# Patient Record
Sex: Male | Born: 1935
Health system: Southern US, Community
[De-identification: ages and names within clinical notes are randomized; demographics above are authoritative.]

## PROBLEM LIST (undated history)

## (undated) ENCOUNTER — Emergency Department (HOSPITAL_COMMUNITY): Admission: EM | Payer: Medicare Other | Source: Home / Self Care

## (undated) DIAGNOSIS — I509 Heart failure, unspecified: Secondary | ICD-10-CM

## (undated) DIAGNOSIS — R51 Headache: Secondary | ICD-10-CM

## (undated) DIAGNOSIS — M549 Dorsalgia, unspecified: Secondary | ICD-10-CM

## (undated) DIAGNOSIS — N529 Male erectile dysfunction, unspecified: Secondary | ICD-10-CM

## (undated) DIAGNOSIS — T8859XA Other complications of anesthesia, initial encounter: Secondary | ICD-10-CM

## (undated) DIAGNOSIS — T887XXA Unspecified adverse effect of drug or medicament, initial encounter: Secondary | ICD-10-CM

## (undated) DIAGNOSIS — I10 Essential (primary) hypertension: Secondary | ICD-10-CM

## (undated) DIAGNOSIS — M542 Cervicalgia: Secondary | ICD-10-CM

## (undated) DIAGNOSIS — R3915 Urgency of urination: Secondary | ICD-10-CM

## (undated) DIAGNOSIS — G8929 Other chronic pain: Secondary | ICD-10-CM

## (undated) DIAGNOSIS — M5430 Sciatica, unspecified side: Secondary | ICD-10-CM

## (undated) DIAGNOSIS — N401 Enlarged prostate with lower urinary tract symptoms: Secondary | ICD-10-CM

## (undated) DIAGNOSIS — L570 Actinic keratosis: Secondary | ICD-10-CM

## (undated) DIAGNOSIS — J309 Allergic rhinitis, unspecified: Secondary | ICD-10-CM

## (undated) DIAGNOSIS — E785 Hyperlipidemia, unspecified: Secondary | ICD-10-CM

## (undated) DIAGNOSIS — G5600 Carpal tunnel syndrome, unspecified upper limb: Secondary | ICD-10-CM

## (undated) DIAGNOSIS — I82409 Acute embolism and thrombosis of unspecified deep veins of unspecified lower extremity: Secondary | ICD-10-CM

## (undated) DIAGNOSIS — Z85820 Personal history of malignant melanoma of skin: Secondary | ICD-10-CM

## (undated) DIAGNOSIS — M1990 Unspecified osteoarthritis, unspecified site: Secondary | ICD-10-CM

## (undated) DIAGNOSIS — G5603 Carpal tunnel syndrome, bilateral upper limbs: Secondary | ICD-10-CM

## (undated) DIAGNOSIS — C44599 Other specified malignant neoplasm of skin of other part of trunk: Secondary | ICD-10-CM

## (undated) DIAGNOSIS — M25569 Pain in unspecified knee: Secondary | ICD-10-CM

## (undated) DIAGNOSIS — M7650 Patellar tendinitis, unspecified knee: Secondary | ICD-10-CM

## (undated) DIAGNOSIS — M545 Low back pain: Secondary | ICD-10-CM

## (undated) DIAGNOSIS — M109 Gout, unspecified: Secondary | ICD-10-CM

## (undated) DIAGNOSIS — Z85828 Personal history of other malignant neoplasm of skin: Secondary | ICD-10-CM

## (undated) DIAGNOSIS — I2510 Atherosclerotic heart disease of native coronary artery without angina pectoris: Secondary | ICD-10-CM

## (undated) HISTORY — DX: Allergic rhinitis, unspecified: J30.9

## (undated) HISTORY — PX: LAMINECTOMY: SHX219

## (undated) HISTORY — DX: Carpal tunnel syndrome, unspecified upper limb: G56.00

## (undated) HISTORY — DX: Benign prostatic hyperplasia with lower urinary tract symptoms: N40.1

## (undated) HISTORY — DX: Actinic keratosis: L57.0

## (undated) HISTORY — PX: LATERAL FUSION LUMBAR SPINE, TRANSVERSE: SUR635

## (undated) HISTORY — DX: Acute embolism and thrombosis of unspecified deep veins of unspecified lower extremity: I82.409

## (undated) HISTORY — PX: KNEE ARTHROSCOPY: SUR90

## (undated) HISTORY — DX: Male erectile dysfunction, unspecified: N52.9

## (undated) HISTORY — DX: Gout, unspecified: M10.9

## (undated) HISTORY — DX: Carpal tunnel syndrome, bilateral upper limbs: G56.03

## (undated) HISTORY — DX: Hyperlipidemia, unspecified: E78.5

## (undated) HISTORY — PX: EYE SURGERY: SHX253

## (undated) HISTORY — DX: Personal history of other malignant neoplasm of skin: Z85.828

## (undated) HISTORY — DX: Low back pain: M54.5

## (undated) HISTORY — PX: VITRECTOMY: SHX106

## (undated) HISTORY — PX: JOINT REPLACEMENT: SHX530

## (undated) HISTORY — PX: BACK SURGERY: SHX140

## (undated) HISTORY — DX: Atherosclerotic heart disease of native coronary artery without angina pectoris: I25.10

## (undated) HISTORY — DX: Patellar tendinitis, unspecified knee: M76.50

## (undated) HISTORY — PX: CHOLECYSTECTOMY: SHX55

## (undated) HISTORY — PX: MELANOMA EXCISION: SHX5266

## (undated) HISTORY — DX: Essential (primary) hypertension: I10

## (undated) HISTORY — DX: Unspecified adverse effect of drug or medicament, initial encounter: T88.7XXA

## (undated) HISTORY — PX: BLEPHAROPLASTY: SUR158

## (undated) HISTORY — DX: Other specified malignant neoplasm of skin of other part of trunk: C44.599

## (undated) HISTORY — PX: CARPAL TUNNEL RELEASE: SHX101

## (undated) HISTORY — DX: Pain in unspecified knee: M25.569

## (undated) HISTORY — PX: RECTAL SURGERY: SHX760

## (undated) HISTORY — PX: NASAL SINUS SURGERY: SHX719

## (undated) NOTE — *Deleted (*Deleted)
PCP - DR Lattie Corns Cardiologist - NA   D hest x-ray - 8/21 EKG DOS-  Stress Test - NA ECHO - 12/19 Cardiac Cath - NA   HX OF HTN    REPORTED T JANES PA  All instructions explained to the patient, with a verbal understanding of the material. Patient agrees to go over the instructions while at home for a better understanding. Patient also instructed to self quarantine after being tested for COVID-19. The opportunity to ask questions was provided.

## (undated) NOTE — *Deleted (*Deleted)
PCP - *** Cardiologist - ***  PPM/ICD - *** Device Orders - *** Rep Notified - ***  Chest x-ray - *** EKG - *** Stress Test - *** ECHO - *** Cardiac Cath - ***  Sleep Study - *** CPAP - ***  Fasting Blood Sugar - *** Checks Blood Sugar *** times a day  Blood Thinner Instructions: *** Aspirin Instructions: ***  ERAS Protcol - *** PRE-SURGERY Ensure or G2- ***  COVID TEST- ***   Anesthesia review: ***   -------------  SDW INSTRUCTIONS:  Your procedure is scheduled on 06-10-20 Friday.  Report to [facility] Main Entrance "A" at 1145 A.M., and check in at the Admitting office.  Call this number if you have problems the morning of surgery: [phone]   Remember: Do not eat or drink after midnight the night before your surgery  Take these medicines the morning of surgery with A SIP OF WATER:  amLODipine (NORVASC) carvedilol (COREG)  fluticasone (FLONASE)  hydrALAZINE (APRESOLINE) Oxycodone - if needed   As of today, STOP taking any Aspirin (unless otherwise instructed by your surgeon), Aleve, Naproxen, Ibuprofen, Motrin, Advil, Goody's, BC's, all herbal medications, fish oil, and all vitamins.    The Morning of Surgery  Do not wear jewelry  Do not wear lotions, powders, colognes, or deodorant Men may shave face and neck.  Do not bring valuables to the hospital.  [facility] is not responsible for any belongings or valuables.  If you are a smoker, DO NOT Smoke 24 hours prior to surgery  If you wear a CPAP at night please bring your mask the morning of surgery   Remember that you must have someone to transport you home after your surgery, and remain with you for 24 hours if you are discharged the same day.   Please bring cases for contacts, glasses, hearing aids, dentures or bridgework because it cannot be worn into surgery.    Leave your suitcase in the car.  After surgery it may be brought to your room.  For patients admitted to the hospital,  discharge time will be determined by your treatment team.  Patients discharged the day of surgery will not be allowed to drive home.    Special instructions:   [place]- Preparing For Surgery  Oral Hygiene is also important to reduce your risk of infection.  Remember - BRUSH YOUR TEETH THE MORNING OF SURGERY WITH YOUR REGULAR TOOTHPASTE  Please follow these instructions carefully.   1. Shower the NIGHT BEFORE SURGERY and the MORNING OF SURGERY with DIAL Soap.   2. Wash thoroughly, paying special attention to the area where your surgery will be performed.  3. Thoroughly rinse your body with warm water from the neck down.  4. Pat yourself dry with a CLEAN TOWEL.  5. Wear CLEAN PAJAMAS to bed the night before surgery  6. Place CLEAN SHEETS on your bed the night of your first shower and DO NOT SLEEP WITH PETS.  7. Wear comfortable clothes the morning of surgery.    Day of Surgery:  Please shower the morning of surgery with the DIAL soap Do not apply any deodorants/lotions. Please wear clean clothes to the hospital/surgery center.   Remember to brush your teeth WITH YOUR REGULAR TOOTHPASTE.   Please read over the following fact sheets that you were given.  Patient denies shortness of breath, fever, cough and chest pain.

---

## 1967-08-14 HISTORY — PX: APPENDECTOMY: SHX54

## 1999-05-10 ENCOUNTER — Encounter: Payer: Self-pay | Admitting: *Deleted

## 1999-05-12 ENCOUNTER — Encounter: Payer: Self-pay | Admitting: *Deleted

## 1999-05-12 ENCOUNTER — Ambulatory Visit (HOSPITAL_COMMUNITY): Admission: RE | Admit: 1999-05-12 | Discharge: 1999-05-13 | Payer: Self-pay | Admitting: *Deleted

## 2004-08-24 ENCOUNTER — Ambulatory Visit: Payer: Self-pay | Admitting: Internal Medicine

## 2004-11-28 ENCOUNTER — Ambulatory Visit: Payer: Self-pay | Admitting: Internal Medicine

## 2004-12-22 ENCOUNTER — Ambulatory Visit: Payer: Self-pay | Admitting: Internal Medicine

## 2005-02-09 ENCOUNTER — Ambulatory Visit: Payer: Self-pay | Admitting: Internal Medicine

## 2005-03-12 ENCOUNTER — Ambulatory Visit: Payer: Self-pay | Admitting: Internal Medicine

## 2005-07-09 ENCOUNTER — Ambulatory Visit: Payer: Self-pay | Admitting: Internal Medicine

## 2005-10-01 ENCOUNTER — Ambulatory Visit: Payer: Self-pay | Admitting: Internal Medicine

## 2005-10-08 ENCOUNTER — Ambulatory Visit: Payer: Self-pay | Admitting: Internal Medicine

## 2005-12-31 ENCOUNTER — Ambulatory Visit: Payer: Self-pay | Admitting: Internal Medicine

## 2006-03-01 ENCOUNTER — Ambulatory Visit: Payer: Self-pay | Admitting: Internal Medicine

## 2006-03-08 ENCOUNTER — Ambulatory Visit: Payer: Self-pay | Admitting: Internal Medicine

## 2006-05-29 ENCOUNTER — Ambulatory Visit: Payer: Self-pay | Admitting: Internal Medicine

## 2006-07-29 ENCOUNTER — Ambulatory Visit: Payer: Self-pay | Admitting: Internal Medicine

## 2006-08-26 ENCOUNTER — Ambulatory Visit (HOSPITAL_COMMUNITY): Admission: RE | Admit: 2006-08-26 | Discharge: 2006-08-26 | Payer: Self-pay | Admitting: Orthopedic Surgery

## 2006-09-27 ENCOUNTER — Ambulatory Visit: Payer: Self-pay | Admitting: Internal Medicine

## 2006-12-26 ENCOUNTER — Ambulatory Visit: Payer: Self-pay | Admitting: Internal Medicine

## 2007-02-25 ENCOUNTER — Ambulatory Visit: Payer: Self-pay | Admitting: Internal Medicine

## 2007-03-24 ENCOUNTER — Encounter: Payer: Self-pay | Admitting: Internal Medicine

## 2007-03-27 DIAGNOSIS — M545 Low back pain, unspecified: Secondary | ICD-10-CM

## 2007-03-27 DIAGNOSIS — J309 Allergic rhinitis, unspecified: Secondary | ICD-10-CM

## 2007-03-27 DIAGNOSIS — Z85828 Personal history of other malignant neoplasm of skin: Secondary | ICD-10-CM

## 2007-03-27 HISTORY — DX: Low back pain, unspecified: M54.50

## 2007-03-27 HISTORY — DX: Personal history of other malignant neoplasm of skin: Z85.828

## 2007-03-27 HISTORY — DX: Allergic rhinitis, unspecified: J30.9

## 2007-04-18 ENCOUNTER — Encounter: Admission: RE | Admit: 2007-04-18 | Discharge: 2007-04-18 | Payer: Self-pay | Admitting: Orthopedic Surgery

## 2007-04-22 ENCOUNTER — Ambulatory Visit (HOSPITAL_BASED_OUTPATIENT_CLINIC_OR_DEPARTMENT_OTHER): Admission: RE | Admit: 2007-04-22 | Discharge: 2007-04-22 | Payer: Self-pay | Admitting: Orthopedic Surgery

## 2007-04-22 ENCOUNTER — Encounter (INDEPENDENT_AMBULATORY_CARE_PROVIDER_SITE_OTHER): Payer: Self-pay | Admitting: Orthopedic Surgery

## 2007-05-07 ENCOUNTER — Encounter: Payer: Self-pay | Admitting: Internal Medicine

## 2007-05-09 ENCOUNTER — Ambulatory Visit: Payer: Self-pay | Admitting: Internal Medicine

## 2007-05-09 DIAGNOSIS — I10 Essential (primary) hypertension: Secondary | ICD-10-CM | POA: Insufficient documentation

## 2007-05-09 DIAGNOSIS — C44599 Other specified malignant neoplasm of skin of other part of trunk: Secondary | ICD-10-CM

## 2007-05-09 HISTORY — DX: Essential (primary) hypertension: I10

## 2007-05-09 HISTORY — DX: Other specified malignant neoplasm of skin of other part of trunk: C44.599

## 2007-05-21 ENCOUNTER — Encounter: Payer: Self-pay | Admitting: Internal Medicine

## 2007-06-04 ENCOUNTER — Encounter: Payer: Self-pay | Admitting: Internal Medicine

## 2007-06-05 ENCOUNTER — Ambulatory Visit: Payer: Self-pay | Admitting: Internal Medicine

## 2007-06-26 ENCOUNTER — Encounter: Payer: Self-pay | Admitting: Internal Medicine

## 2007-08-18 ENCOUNTER — Ambulatory Visit: Payer: Self-pay | Admitting: Internal Medicine

## 2007-08-18 DIAGNOSIS — T887XXA Unspecified adverse effect of drug or medicament, initial encounter: Secondary | ICD-10-CM

## 2007-08-18 DIAGNOSIS — I2510 Atherosclerotic heart disease of native coronary artery without angina pectoris: Secondary | ICD-10-CM | POA: Insufficient documentation

## 2007-08-18 HISTORY — DX: Atherosclerotic heart disease of native coronary artery without angina pectoris: I25.10

## 2007-08-18 HISTORY — DX: Unspecified adverse effect of drug or medicament, initial encounter: T88.7XXA

## 2007-08-18 LAB — CONVERTED CEMR LAB
ALT: 47 units/L (ref 0–53)
AST: 34 units/L (ref 0–37)
Albumin: 4.2 g/dL (ref 3.5–5.2)
Alkaline Phosphatase: 54 units/L (ref 39–117)
BUN: 22 mg/dL (ref 6–23)
Bilirubin, Direct: 0.3 mg/dL (ref 0.0–0.3)
CO2: 29 meq/L (ref 19–32)
Calcium: 10 mg/dL (ref 8.4–10.5)
Chloride: 106 meq/L (ref 96–112)
Cholesterol: 223 mg/dL (ref 0–200)
Creatinine, Ser: 1.3 mg/dL (ref 0.4–1.5)
Direct LDL: 147.9 mg/dL
GFR calc Af Amer: 70 mL/min
GFR calc non Af Amer: 58 mL/min
Glucose, Bld: 94 mg/dL (ref 70–99)
HDL: 50.4 mg/dL (ref >39.0)
Potassium: 5.2 meq/L — ABNORMAL HIGH (ref 3.5–5.1)
Sodium: 141 meq/L (ref 135–145)
Total Bilirubin: 1 mg/dL (ref 0.3–1.2)
Total CHOL/HDL Ratio: 4.4
Total Protein: 6.9 g/dL (ref 6.0–8.3)
Triglycerides: 95 mg/dL (ref 0–149)
VLDL: 19 mg/dL (ref 0–40)

## 2007-08-29 ENCOUNTER — Telehealth: Payer: Self-pay | Admitting: Internal Medicine

## 2007-09-04 ENCOUNTER — Telehealth: Payer: Self-pay | Admitting: Internal Medicine

## 2007-10-16 ENCOUNTER — Ambulatory Visit: Payer: Self-pay | Admitting: Internal Medicine

## 2007-10-16 DIAGNOSIS — N138 Other obstructive and reflux uropathy: Secondary | ICD-10-CM | POA: Insufficient documentation

## 2007-10-16 DIAGNOSIS — N401 Enlarged prostate with lower urinary tract symptoms: Secondary | ICD-10-CM

## 2007-10-16 HISTORY — DX: Benign prostatic hyperplasia with lower urinary tract symptoms: N13.8

## 2007-10-16 HISTORY — DX: Benign prostatic hyperplasia with lower urinary tract symptoms: N40.1

## 2007-10-16 LAB — CONVERTED CEMR LAB: PSA: 0.89 ng/mL (ref 0.10–4.00)

## 2008-01-01 ENCOUNTER — Telehealth: Payer: Self-pay | Admitting: Internal Medicine

## 2008-01-16 ENCOUNTER — Ambulatory Visit: Payer: Self-pay | Admitting: Internal Medicine

## 2008-01-16 LAB — CONVERTED CEMR LAB
ALT: 38 units/L (ref 0–53)
AST: 24 units/L (ref 0–37)
Albumin: 3.9 g/dL (ref 3.5–5.2)
Alkaline Phosphatase: 63 units/L (ref 39–117)
BUN: 24 mg/dL — ABNORMAL HIGH (ref 6–23)
Bilirubin, Direct: 0.1 mg/dL (ref 0.0–0.3)
CO2: 27 meq/L (ref 19–32)
Calcium: 9.3 mg/dL (ref 8.4–10.5)
Chloride: 100 meq/L (ref 96–112)
Cholesterol: 218 mg/dL (ref 0–200)
Creatinine, Ser: 1 mg/dL (ref 0.4–1.5)
Direct LDL: 140.8 mg/dL
GFR calc Af Amer: 94 mL/min
GFR calc non Af Amer: 78 mL/min
Glucose, Bld: 87 mg/dL (ref 70–99)
HDL: 62 mg/dL (ref >39.0)
Potassium: 4 meq/L (ref 3.5–5.1)
Sodium: 136 meq/L (ref 135–145)
Total Bilirubin: 1.1 mg/dL (ref 0.3–1.2)
Total CHOL/HDL Ratio: 3.5
Total Protein: 7 g/dL (ref 6.0–8.3)
Triglycerides: 84 mg/dL (ref 0–149)
VLDL: 17 mg/dL (ref 0–40)

## 2008-03-23 ENCOUNTER — Ambulatory Visit: Payer: Self-pay | Admitting: Internal Medicine

## 2008-03-23 DIAGNOSIS — L570 Actinic keratosis: Secondary | ICD-10-CM

## 2008-03-23 HISTORY — DX: Actinic keratosis: L57.0

## 2008-04-13 ENCOUNTER — Ambulatory Visit: Payer: Self-pay | Admitting: Internal Medicine

## 2008-04-13 DIAGNOSIS — G5600 Carpal tunnel syndrome, unspecified upper limb: Secondary | ICD-10-CM

## 2008-04-13 HISTORY — DX: Carpal tunnel syndrome, unspecified upper limb: G56.00

## 2008-05-18 ENCOUNTER — Ambulatory Visit: Payer: Self-pay | Admitting: Internal Medicine

## 2008-06-17 ENCOUNTER — Telehealth: Payer: Self-pay | Admitting: Internal Medicine

## 2008-07-13 ENCOUNTER — Ambulatory Visit: Payer: Self-pay | Admitting: Internal Medicine

## 2008-10-11 ENCOUNTER — Ambulatory Visit: Payer: Self-pay | Admitting: Internal Medicine

## 2008-10-11 LAB — CONVERTED CEMR LAB
ALT: 35 units/L (ref 0–53)
AST: 29 units/L (ref 0–37)
Albumin: 4 g/dL (ref 3.5–5.2)
Alkaline Phosphatase: 53 units/L (ref 39–117)
Bilirubin, Direct: 0.3 mg/dL (ref 0.0–0.3)
Cholesterol: 206 mg/dL (ref 0–200)
Direct LDL: 127.6 mg/dL
HDL: 50.4 mg/dL (ref >39.0)
PSA: 0.77 ng/mL (ref 0.10–4.00)
Total Bilirubin: 1.3 mg/dL — ABNORMAL HIGH (ref 0.3–1.2)
Total CHOL/HDL Ratio: 4.1
Total Protein: 6.9 g/dL (ref 6.0–8.3)
Triglycerides: 72 mg/dL (ref 0–149)
VLDL: 14 mg/dL (ref 0–40)

## 2008-10-18 ENCOUNTER — Ambulatory Visit: Payer: Self-pay | Admitting: Internal Medicine

## 2008-10-18 DIAGNOSIS — E785 Hyperlipidemia, unspecified: Secondary | ICD-10-CM

## 2008-10-18 HISTORY — DX: Hyperlipidemia, unspecified: E78.5

## 2008-11-03 ENCOUNTER — Encounter: Payer: Self-pay | Admitting: Internal Medicine

## 2008-11-19 ENCOUNTER — Ambulatory Visit (HOSPITAL_BASED_OUTPATIENT_CLINIC_OR_DEPARTMENT_OTHER): Admission: RE | Admit: 2008-11-19 | Discharge: 2008-11-19 | Payer: Self-pay | Admitting: Orthopedic Surgery

## 2009-01-12 ENCOUNTER — Ambulatory Visit: Payer: Self-pay | Admitting: Internal Medicine

## 2009-01-12 LAB — CONVERTED CEMR LAB
ALT: 34 units/L (ref 0–53)
AST: 30 units/L (ref 0–37)
Albumin: 3.6 g/dL (ref 3.5–5.2)
Alkaline Phosphatase: 50 units/L (ref 39–117)
Bilirubin, Direct: 0 mg/dL (ref 0.0–0.3)
Cholesterol: 227 mg/dL — ABNORMAL HIGH (ref 0–200)
Direct LDL: 150.9 mg/dL
HDL: 56.8 mg/dL (ref >39.00)
Total Bilirubin: 1.1 mg/dL (ref 0.3–1.2)
Total CHOL/HDL Ratio: 4
Total Protein: 6.8 g/dL (ref 6.0–8.3)
Triglycerides: 93 mg/dL (ref 0.0–149.0)
VLDL: 18.6 mg/dL (ref 0.0–40.0)

## 2009-01-19 ENCOUNTER — Ambulatory Visit: Payer: Self-pay | Admitting: Internal Medicine

## 2009-03-17 ENCOUNTER — Telehealth: Payer: Self-pay | Admitting: *Deleted

## 2009-03-18 ENCOUNTER — Ambulatory Visit: Payer: Self-pay | Admitting: Family Medicine

## 2009-03-18 DIAGNOSIS — M79609 Pain in unspecified limb: Secondary | ICD-10-CM | POA: Insufficient documentation

## 2009-03-23 ENCOUNTER — Telehealth: Payer: Self-pay | Admitting: Family Medicine

## 2009-05-16 ENCOUNTER — Ambulatory Visit: Payer: Self-pay | Admitting: Internal Medicine

## 2009-05-16 LAB — CONVERTED CEMR LAB
ALT: 36 units/L (ref 0–53)
AST: 28 units/L (ref 0–37)
Albumin: 3.9 g/dL (ref 3.5–5.2)
Alkaline Phosphatase: 57 units/L (ref 39–117)
Bilirubin, Direct: 0.2 mg/dL (ref 0.0–0.3)
Cholesterol: 168 mg/dL (ref 0–200)
HDL: 46.2 mg/dL (ref >39.00)
LDL Cholesterol: 111 mg/dL — ABNORMAL HIGH (ref 0–99)
Total Bilirubin: 1 mg/dL (ref 0.3–1.2)
Total CHOL/HDL Ratio: 4
Total Protein: 7.2 g/dL (ref 6.0–8.3)
Triglycerides: 56 mg/dL (ref 0.0–149.0)
VLDL: 11.2 mg/dL (ref 0.0–40.0)

## 2009-05-23 ENCOUNTER — Ambulatory Visit: Payer: Self-pay | Admitting: Internal Medicine

## 2009-07-25 ENCOUNTER — Telehealth: Payer: Self-pay | Admitting: Internal Medicine

## 2009-08-22 ENCOUNTER — Ambulatory Visit: Payer: Self-pay | Admitting: Internal Medicine

## 2009-08-22 LAB — CONVERTED CEMR LAB
Cholesterol, target level: 200 mg/dL
HDL goal, serum: 40 mg/dL
LDL Goal: 100 mg/dL

## 2009-09-16 ENCOUNTER — Telehealth: Payer: Self-pay | Admitting: Internal Medicine

## 2009-09-20 ENCOUNTER — Ambulatory Visit: Payer: Self-pay | Admitting: Internal Medicine

## 2009-10-10 ENCOUNTER — Telehealth: Payer: Self-pay | Admitting: Internal Medicine

## 2009-10-10 DIAGNOSIS — M25569 Pain in unspecified knee: Secondary | ICD-10-CM

## 2009-10-10 DIAGNOSIS — M7650 Patellar tendinitis, unspecified knee: Secondary | ICD-10-CM | POA: Insufficient documentation

## 2009-10-10 HISTORY — DX: Pain in unspecified knee: M25.569

## 2009-10-10 HISTORY — DX: Patellar tendinitis, unspecified knee: M76.50

## 2009-10-12 ENCOUNTER — Encounter: Payer: Self-pay | Admitting: Internal Medicine

## 2009-10-18 ENCOUNTER — Telehealth: Payer: Self-pay | Admitting: Internal Medicine

## 2009-11-16 ENCOUNTER — Encounter: Payer: Self-pay | Admitting: Internal Medicine

## 2009-11-21 ENCOUNTER — Ambulatory Visit: Payer: Self-pay | Admitting: Internal Medicine

## 2009-11-23 ENCOUNTER — Inpatient Hospital Stay (HOSPITAL_COMMUNITY): Admission: RE | Admit: 2009-11-23 | Discharge: 2009-11-27 | Payer: Self-pay | Admitting: Specialist

## 2010-02-20 ENCOUNTER — Ambulatory Visit: Payer: Self-pay | Admitting: Internal Medicine

## 2010-02-20 DIAGNOSIS — N529 Male erectile dysfunction, unspecified: Secondary | ICD-10-CM

## 2010-02-20 DIAGNOSIS — M109 Gout, unspecified: Secondary | ICD-10-CM | POA: Insufficient documentation

## 2010-02-20 DIAGNOSIS — Z8739 Personal history of other diseases of the musculoskeletal system and connective tissue: Secondary | ICD-10-CM | POA: Insufficient documentation

## 2010-02-20 HISTORY — DX: Male erectile dysfunction, unspecified: N52.9

## 2010-02-20 HISTORY — DX: Gout, unspecified: M10.9

## 2010-05-22 ENCOUNTER — Ambulatory Visit: Payer: Self-pay | Admitting: Internal Medicine

## 2010-05-22 LAB — CONVERTED CEMR LAB
ALT: 21 units/L (ref 0–53)
AST: 22 units/L (ref 0–37)
Albumin: 4.2 g/dL (ref 3.5–5.2)
Alkaline Phosphatase: 69 units/L (ref 39–117)
BUN: 17 mg/dL (ref 6–23)
Bilirubin, Direct: 0.2 mg/dL (ref 0.0–0.3)
CO2: 27 meq/L (ref 19–32)
Calcium: 9.5 mg/dL (ref 8.4–10.5)
Chloride: 103 meq/L (ref 96–112)
Cholesterol: 194 mg/dL (ref 0–200)
Creatinine, Ser: 1 mg/dL (ref 0.4–1.5)
GFR calc non Af Amer: 79.82 mL/min (ref >60)
Glucose, Bld: 102 mg/dL — ABNORMAL HIGH (ref 70–99)
HDL: 73.4 mg/dL (ref >39.00)
LDL Cholesterol: 107 mg/dL — ABNORMAL HIGH (ref 0–99)
Potassium: 3.9 meq/L (ref 3.5–5.1)
Sodium: 139 meq/L (ref 135–145)
Total Bilirubin: 1 mg/dL (ref 0.3–1.2)
Total CHOL/HDL Ratio: 3
Total Protein: 6.9 g/dL (ref 6.0–8.3)
Triglycerides: 67 mg/dL (ref 0.0–149.0)
Uric Acid, Serum: 8.7 mg/dL — ABNORMAL HIGH (ref 4.0–7.8)
VLDL: 13.4 mg/dL (ref 0.0–40.0)

## 2010-07-10 ENCOUNTER — Telehealth: Payer: Self-pay | Admitting: Internal Medicine

## 2010-07-24 ENCOUNTER — Telehealth: Payer: Self-pay | Admitting: Internal Medicine

## 2010-07-24 ENCOUNTER — Ambulatory Visit: Payer: Self-pay | Admitting: Internal Medicine

## 2010-07-24 DIAGNOSIS — J069 Acute upper respiratory infection, unspecified: Secondary | ICD-10-CM | POA: Insufficient documentation

## 2010-09-01 ENCOUNTER — Telehealth (INDEPENDENT_AMBULATORY_CARE_PROVIDER_SITE_OTHER): Payer: Self-pay | Admitting: *Deleted

## 2010-09-05 ENCOUNTER — Telehealth: Payer: Self-pay | Admitting: Internal Medicine

## 2010-09-12 NOTE — Progress Notes (Signed)
Summary: ? knee replacement?  Phone Note Call from Patient   Caller: Patient Call For: Stacie Glaze MD Reason for Call: Talk to Nurse, Lab or Test Results Summary of Call: Knee exploded ???   Needs replacement.  Yesterday, it was painful and after church , he was walking and had extreme pain.  Do you want to see him or send him to orrthopedic referral. [phone] Requests Dr. Lovell Sheehan choice of orthopedic MD. Needs Pain meds! [facility] [address]) Initial call taken by: Lynann Beaver CMA,  October 10, 2009 10:03 AM  Follow-up for Phone Call        may see dr Charlann Boxer at [facility]- I sent order to terri- may have vicodan  5-500 1 every 4-6 hours prn pain # 30 as needed  Follow-up by: Willy Eddy, LPN,  October 10, 2009 10:18 AM  New Problems: KNEE PAIN (ICD-719.46) PATELLAR TENDINITIS (ICD-726.64)   New Problems: KNEE PAIN (ICD-719.46) PATELLAR TENDINITIS (ICD-726.64) New/Updated Medications: VICODIN 5-500 MG TABS (HYDROCODONE-ACETAMINOPHEN) one by mouth q 4-6 hours as needed pain Prescriptions: VICODIN 5-500 MG TABS (HYDROCODONE-ACETAMINOPHEN) one by mouth q 4-6 hours as needed pain  #30 x 0   Entered by:   Lynann Beaver CMA   Authorized by:   Stacie Glaze MD   Signed by:   Lynann Beaver CMA on 10/10/2009   Method used:   Telephoned to ...       [facility]  [address] [id number]       [address]       [place], [place]  [address]       Ph: [phone] or [phone]       Fax: [phone]   RxID:   [id number]  Pt notified.

## 2010-09-12 NOTE — Letter (Signed)
Summary: [facility]  [facility]   Imported By: Maryln Gottron 11/17/2008 12:45:12  _____________________________________________________________________  External Attachment:    Type:   Image     Comment:   External Document

## 2010-09-12 NOTE — Progress Notes (Signed)
Summary: Call A Nurse  Phone Note Call from Patient   Summary of Call: called pt and he did go to [facility] [address] an d states he was dx'd with pneumonia and was given levaquin for 5 days- pt instructed to call us back if not better in several days Initial call taken by: Willy Eddy, LPN,  July 10, 2010 10:02 AM     Call-A-Nurse Triage Call Report Triage Record Num: [id number] Operator: Caswell Corwin Patient Name: [name] Call Date & Time: 07/08/2010 1:34:11PM Patient Phone: [phone] PCP: Patient Gender: Male PCP Fax : Patient DOB: 12/22/1931 Practice Name: [facility] Reason for Call: Pt calling that he has congestion, stopped up and achy. SX started 07/06/10. Temp is 100.0 Last voided at 1330. Triaged flu and URI and all ? neg. Pt wants a Z-Pac. Pt has just taken his temp again and it says 101.2. Paged Dr. Kerby Nora and recalled at 1358 and inst to inst pt they do not give anbx without seeing the pt so he may go to an U/C or be seen 07/10/10. Protocol(s) Used: Flu-Like Symptoms Protocol(s) Used: Upper Respiratory Infection (URI) Recommended Outcome per Protocol: Provide Home/Self Care Reason for Outcome: Sudden onset of flu-like symptoms All other situations Care Advice:  ~ 11/

## 2010-09-12 NOTE — Assessment & Plan Note (Signed)
Summary: emp--will fast//ccm   Vital Signs:  Patient profile:   77 year old [name] Height:      72 inches Weight:      200 pounds BMI:     27.22 Temp:     98.2 degrees F oral Pulse rate:   72 / minute Resp:     14 per minute BP sitting:   140 / 80  (left arm)  Vitals Entered By: Willy Eddy, LPN (May 22, 2010 11:12 AM) CC: annual visit for disease management Is Patient Diabetic? No   Primary Care Provider:  Stacie Glaze MD  CC:  annual visit for disease management.  History of Present Illness: Here for Medicare AWV:  1.   Risk factors based on Past M, S, F history: reviewed chart CAD risks identifies as well as gout  2.   Physical Activities:  walks daily 3.   Depression/mood:  moderate depression due to losss of child 4.   Hearing:  hears whispered voice at 6 fee 5.   ADL's:  functional on all levels 6.   Fall Risk:  none 7.   Home Safety:  no risks identified 8.   Height, weight, &visual acuity: see change 9.   Counseling:  weight loss 10.   Labs ordered based on risk factors:  see orders 11.           Referral Coordination orthopedic for follow op of TKR 12.           Care Plan see personalized plan for pt scanned in today 13.            Cognitive Assessment alert and oreinted can calculate recongnized time on clock, good judgement and appropriate mood   Preventive Screening-Counseling & Management  Alcohol-Tobacco     Smoking Status: quit     Year Quit: 1983     Tobacco Counseling: not indicated; no tobacco use  Problems Prior to Update: 1)  Gout, Unspecified  (ICD-274.9) 2)  Erectile Dysfunction, Organic  (ICD-607.84) 3)  Knee Pain  (ICD-719.46) 4)  Patellar Tendinitis  (ICD-726.64) 5)  Foot Pain  (ICD-729.5) 6)  Hyperlipidemia, With High Hdl  (ICD-272.4) 7)  Carpal Tunnel Syndrome, Left  (ICD-354.0) 8)  Actinic Keratosis, Head  (ICD-702.0) 9)  Benign Prostatic Hypertrophy, With Urinary Obstruction  (ICD-600.01) 10)  Uns Advrs Eff Uns Rx  Medicinal&biological Sbstnc  (ICD-995.20) 11)  Cad  (ICD-414.00) 12)  Neoplasm, Malignant, Skin, Trunk  (ICD-173.5) 13)  Hypertension  (ICD-401.9) 14)  Low Back Pain  (ICD-724.2) 15)  Skin Cancer, Hx of  (ICD-V10.83) 16)  Allergic Rhinitis  (ICD-477.9)  Current Problems (verified): 1)  Gout, Unspecified  (ICD-274.9) 2)  Erectile Dysfunction, Organic  (ICD-607.84) 3)  Knee Pain  (ICD-719.46) 4)  Patellar Tendinitis  (ICD-726.64) 5)  Foot Pain  (ICD-729.5) 6)  Hyperlipidemia, With High Hdl  (ICD-272.4) 7)  Carpal Tunnel Syndrome, Left  (ICD-354.0) 8)  Actinic Keratosis, Head  (ICD-702.0) 9)  Benign Prostatic Hypertrophy, With Urinary Obstruction  (ICD-600.01) 10)  Uns Advrs Eff Uns Rx Medicinal&biological Sbstnc  (ICD-995.20) 11)  Cad  (ICD-414.00) 12)  Neoplasm, Malignant, Skin, Trunk  (ICD-173.5) 13)  Hypertension  (ICD-401.9) 14)  Low Back Pain  (ICD-724.2) 15)  Skin Cancer, Hx of  (ICD-V10.83) 16)  Allergic Rhinitis  (ICD-477.9)  Medications Prior to Update: 1)  Astelin 137 Mcg/spray Soln (Azelastine Hcl) .... Two Times A Day 2)  Taztia Xt 300 Mg Cp24 (Diltiazem Hcl Er Beads) .... Once Daily  3)  Triamcinolone Acetonide 0.5 % Crea (Triamcinolone Acetonide) .... As Directed 4)  Micardis Hct 80-25 Mg  Tabs (Telmisartan-Hctz) .... Once Daily 5)  Crestor 10 Mg Tabs (Rosuvastatin Calcium) .... One By Mouth  Monday and Friday ( Twice A Week) 6)  Vicodin 5-500 Mg Tabs (Hydrocodone-Acetaminophen) .... One By Mouth Q 4-6 Hours As Needed Pain 7)  Methylprednisolone (Pak) 4 Mg Tabs (Methylprednisolone) .... Take As Directed  Current Medications (verified): 1)  Astelin 137 Mcg/spray Soln (Azelastine Hcl) .... Two Times A Day 2)  Taztia Xt 300 Mg Cp24 (Diltiazem Hcl Er Beads) .... Once Daily 3)  Triamcinolone Acetonide 0.5 % Crea (Triamcinolone Acetonide) .... As Directed 4)  Micardis Hct 80-25 Mg  Tabs (Telmisartan-Hctz) .... Once Daily 5)  Crestor 20 Mg Tabs (Rosuvastatin Calcium) .Marland Kitchen..  1 On Monday Night 6)  Vicodin 5-500 Mg Tabs (Hydrocodone-Acetaminophen) .... One By Mouth Q 4-6 Hours As Needed Pain  Allergies (verified): 1)  * Atuss  Past History:  Family History: Last updated: 17-Sep-2007 father died from sideroblastic anemia mother died from dementia sister with anurysm  Social History: Last updated: September 17, 2007 Married Former Smoker Alcohol use-no Drug use-no Regular exercise-yes  Risk Factors: Exercise: yes (2007/09/17)  Risk Factors: Smoking Status: quit (05/22/2010)  Past medical, surgical, family and social histories (including risk factors) reviewed, and no changes noted (except as noted below).  Past Medical History: Reviewed history from 04/13/2008 and no changes required. Allergic rhinitis Skin cancer, hx of Hypertension Low back pain carple tunnel  Past Surgical History: Reviewed history from 05/09/2007 and no changes required. knee arthroscopy Appendectomy Cholecystectomy Sinus surgery Vasectomy Lumbar laminectomy Lumbar fusion rectal fisure joint replacement hand  Family History: Reviewed history from 17-Sep-2007 and no changes required. father died from sideroblastic anemia mother died from dementia sister with anurysm  Social History: Reviewed history from 09-17-2007 and no changes required. Married Former Smoker Alcohol use-no Drug use-no Regular exercise-yes  Review of Systems  The patient denies anorexia, fever, weight loss, weight gain, vision loss, decreased hearing, hoarseness, chest pain, syncope, dyspnea on exertion, peripheral edema, prolonged cough, headaches, hemoptysis, abdominal pain, melena, hematochezia, severe indigestion/heartburn, hematuria, incontinence, genital sores, muscle weakness, suspicious skin lesions, transient blindness, difficulty walking, depression, unusual weight change, abnormal bleeding, enlarged lymph nodes, angioedema, breast masses, and testicular masses.         Flu Vaccine  Consent Questions     Do you have a history of severe allergic reactions to this vaccine? no    Any prior history of allergic reactions to egg and/or gelatin? no    Do you have a sensitivity to the preservative Thimersol? no    Do you have a past history of Guillan-Barre Syndrome? no    Do you currently have an acute febrile illness? no    Have you ever had a severe reaction to latex? no    Vaccine information given and explained to patient? yes    Are you currently pregnant? no    Lot Number:[id number]   Exp Date:02/10/2011   Site Given  Left Deltoid IM   Physical Exam  General:  Well-developed,well-nourished,in no acute distress; alert,appropriate and cooperative throughout examination Eyes:  pupils equal, pupils round, and pupils reactive to light.   Ears:  R ear normal and L ear normal.   Neck:  No deformities, masses, or tenderness noted. Lungs:  normal respiratory effort and no wheezes.   Heart:  normal rate and regular rhythm.   Abdomen:  soft and non-tender.  Msk:  joint tenderness, joint swelling, and joint warmth.   Extremities:  No clubbing, cyanosis, edema, or deformity noted with normal full range of motion of all joints.   Neurologic:  No cranial nerve deficits noted. Station and gait are normal. Plantar reflexes are down-going bilaterally. DTRs are symmetrical throughout. Sensory, motor and coordinative functions appear intact. Skin:  well healed without recurance Cervical Nodes:  No lymphadenopathy noted   Impression & Recommendations:  Problem # 1:  PREVENTIVE HEALTH CARE (ICD-V70.0) Assessment Unchanged  The pt was asked about all immunizations, health maint. services that are appropriate to their age and was given guidance on diet exercize  and weight management  Td Booster: Historical (08/13/2006)   Flu Vax: Fluvax 3+ (05/22/2010)   Pneumovax: Historical (08/13/2006) Chol: 168 (05/16/2009)   HDL: 46.20 (05/16/2009)   LDL: 111 (05/16/2009)   TG: 56.0  (05/16/2009) PSA: 0.77 (10/11/2008)  Discussed using sunscreen, use of alcohol, drug use, self testicular exam, routine dental care, routine eye care, routine physical exam, seat belts, multiple vitamins, osteoporosis prevention, adequate calcium intake in diet, and recommendations for immunizations.  Discussed exercise and checking cholesterol.  Discussed gun safety, safe sex, and contraception. Also recommend checking PSA.  Orders: Medicare -1st Annual Wellness Visit [phone])  Problem # 2:  HYPERLIPIDEMIA, WITH HIGH HDL (ICD-272.4) Assessment: Unchanged  His updated medication list for this problem includes:    Crestor 20 Mg Tabs (Rosuvastatin calcium) .Marland Kitchen... 1 on monday night  Labs Reviewed: SGOT: 28 (05/16/2009)   SGPT: 36 (05/16/2009)  Lipid Goals: Chol Goal: 200 (08/22/2009)   HDL Goal: 40 (08/22/2009)   LDL Goal: 100 (08/22/2009)   TG Goal: 150 (08/22/2009)  Prior 10 Yr Risk Heart Disease: N/A (08/18/2007)   HDL:46.20 (05/16/2009), 56.80 (01/12/2009)  LDL:111 (05/16/2009), DEL (10/11/2008)  Chol:168 (05/16/2009), 227 (01/12/2009)  Trig:56.0 (05/16/2009), 93.0 (01/12/2009)  Orders: TLB-Lipid Panel (80061-LIPID)  Problem # 3:  CAD (ICD-414.00) Assessment: Unchanged control of blood pressure and  His updated medication list for this problem includes:    Taztia Xt 300 Mg Cp24 (Diltiazem hcl er beads) ..... Once daily    Micardis Hct 80-25 Mg Tabs (Telmisartan-hctz) ..... Once daily  Labs Reviewed: Chol: 168 (05/16/2009)   HDL: 46.20 (05/16/2009)   LDL: 111 (05/16/2009)   TG: 56.0 (05/16/2009)  Lipid Goals: Chol Goal: 200 (08/22/2009)   HDL Goal: 40 (08/22/2009)   LDL Goal: 100 (08/22/2009)   TG Goal: 150 (08/22/2009)  Complete Medication List: 1)  Astelin 137 Mcg/spray Soln (Azelastine hcl) .... Two times a day 2)  Taztia Xt 300 Mg Cp24 (Diltiazem hcl er beads) .... Once daily 3)  Triamcinolone Acetonide 0.5 % Crea (Triamcinolone acetonide) .... As directed 4)  Micardis  Hct 80-25 Mg Tabs (Telmisartan-hctz) .... Once daily 5)  Crestor 20 Mg Tabs (Rosuvastatin calcium) .Marland Kitchen.. 1 on monday night 6)  Vicodin 5-500 Mg Tabs (Hydrocodone-acetaminophen) .... One by mouth q 4-6 hours as needed pain  Other Orders: Flu Vaccine 57yrs + MEDICARE PATIENTS (J8119) Administration Flu vaccine - MCR (G0008) TLB-Hepatic/Liver Function Pnl (80076-HEPATIC) TLB-BMP (Basic Metabolic Panel-BMET) (80048-METABOL) TLB-Uric Acid, Blood (84550-URIC)  Patient Instructions: 1)  Please schedule a follow-up appointment in 4 months.  Prevention & Chronic Care Immunizations   Influenza vaccine: Fluvax 3+  (05/22/2010)    Tetanus booster: 08/13/2006: Historical    Pneumococcal vaccine: Historical  (08/13/2006)    H. zoster vaccine: Not documented  Colorectal Screening   Hemoccult: Not documented    Colonoscopy: Not documented  Other Screening  PSA: 0.77  (10/11/2008)   PSA action/deferral: Discussed-PSA requested  (05/22/2010)   Smoking status: quit  (05/22/2010)  Lipids   Total Cholesterol: 168  (05/16/2009)   Lipid panel action/deferral: Lipid Panel ordered   LDL: 111  (05/16/2009)   LDL Direct: 150.9  (01/12/2009)   HDL: 46.20  (05/16/2009)   Triglycerides: 56.0  (05/16/2009)    SGOT (AST): 28  (05/16/2009)   BMP action: Ordered   SGPT (ALT): 36  (05/16/2009)   Alkaline phosphatase: 57  (05/16/2009)   Total bilirubin: 1.0  (05/16/2009)    Lipid flowsheet reviewed?: Yes   Progress toward LDL goal: At goal  Hypertension   Last Blood Pressure: 140 / 80  (05/22/2010)   Serum creatinine: 1.0  (01/16/2008)   BMP action: Ordered   Serum potassium 4.0  (01/16/2008)    Hypertension flowsheet reviewed?: Yes   Progress toward BP goal: At goal  Self-Management Support :    Patient will work on the following items until the next clinic visit to reach self-care goals:     Medications and monitoring: check my blood pressure  (05/22/2010)     Eating: eat fruit for  snacks and desserts, limit or avoid alcohol  (05/22/2010)     Activity: take a 30 minute walk every day  (05/22/2010)    Hypertension self-management support: BP self-monitoring log  (05/22/2010)    Lipid self-management support: Lipid monitoring log  (05/22/2010)     Appended Document: Orders Update    Clinical Lists Changes  Orders: Added new Service order of Venipuncture ([id number]) - Signed Added new Service order of Specimen Handling ([id number]) - Signed

## 2010-09-12 NOTE — Assessment & Plan Note (Signed)
Summary: 3 month fup//ccm   Vital Signs:  Patient profile:   77 year old male Height:      72 inches Weight:      199 pounds BMI:     27.09 Temp:     98.2 degrees F oral Pulse rate:   72 / minute Resp:     14 per minute BP sitting:   140 / 80  (left arm)  Vitals Entered By: Willy Eddy, LPN (February 20, 2010 9:40 AM)  Nutrition Counseling: Patient's BMI is greater than 25 and therefore counseled on weight management options. CC: roa- check moles--c/o gout in rt great toe   CC:  roa- check moles--c/o gout in rt great toe.  History of Present Illness: The pt had a total knee replacement and has been doing well had an episode of gout in the right ankle and now in the right great toe he has had multi joint inflamation and gout ( needs a uric acid) has questions about sexual fuctioning ( the pt has failed viaga in the past) has questions about pumps and shots for ED  Preventive Screening-Counseling & Management  Alcohol-Tobacco     Smoking Status: quit  Problems Prior to Update: 1)  Knee Pain  (ICD-719.46) 2)  Patellar Tendinitis  (ICD-726.64) 3)  Foot Pain  (ICD-729.5) 4)  Hyperlipidemia, With High Hdl  (ICD-272.4) 5)  Carpal Tunnel Syndrome, Left  (ICD-354.0) 6)  Actinic Keratosis, Head  (ICD-702.0) 7)  Benign Prostatic Hypertrophy, With Urinary Obstruction  (ICD-600.01) 8)  Uns Advrs Eff Uns Rx Medicinal&biological Sbstnc  (ICD-995.20) 9)  Cad  (ICD-414.00) 10)  Neoplasm, Malignant, Skin, Trunk  (ICD-173.5) 11)  Hypertension  (ICD-401.9) 12)  Low Back Pain  (ICD-724.2) 13)  Skin Cancer, Hx of  (ICD-V10.83) 14)  Allergic Rhinitis  (ICD-477.9)  Current Problems (verified): 1)  Knee Pain  (ICD-719.46) 2)  Patellar Tendinitis  (ICD-726.64) 3)  Foot Pain  (ICD-729.5) 4)  Hyperlipidemia, With High Hdl  (ICD-272.4) 5)  Carpal Tunnel Syndrome, Left  (ICD-354.0) 6)  Actinic Keratosis, Head  (ICD-702.0) 7)  Benign Prostatic Hypertrophy, With Urinary Obstruction   (ICD-600.01) 8)  Uns Advrs Eff Uns Rx Medicinal&biological Sbstnc  (ICD-995.20) 9)  Cad  (ICD-414.00) 10)  Neoplasm, Malignant, Skin, Trunk  (ICD-173.5) 11)  Hypertension  (ICD-401.9) 12)  Low Back Pain  (ICD-724.2) 13)  Skin Cancer, Hx of  (ICD-V10.83) 14)  Allergic Rhinitis  (ICD-477.9)  Medications Prior to Update: 1)  Astelin 137 Mcg/spray Soln (Azelastine Hcl) .... Two Times A Day 2)  Taztia Xt 300 Mg Cp24 (Diltiazem Hcl Er Beads) .... Once Daily 3)  Triamcinolone Acetonide 0.5 % Crea (Triamcinolone Acetonide) .... As Directed 4)  Micardis Hct 80-25 Mg  Tabs (Telmisartan-Hctz) .... Once Daily 5)  Crestor 10 Mg Tabs (Rosuvastatin Calcium) .... One By Mouth  Monday and Friday ( Twice A Week) 6)  Vicodin 5-500 Mg Tabs (Hydrocodone-Acetaminophen) .... One By Mouth Q 4-6 Hours As Needed Pain  Current Medications (verified): 1)  Astelin 137 Mcg/spray Soln (Azelastine Hcl) .... Two Times A Day 2)  Taztia Xt 300 Mg Cp24 (Diltiazem Hcl Er Beads) .... Once Daily 3)  Triamcinolone Acetonide 0.5 % Crea (Triamcinolone Acetonide) .... As Directed 4)  Micardis Hct 80-25 Mg  Tabs (Telmisartan-Hctz) .... Once Daily 5)  Crestor 10 Mg Tabs (Rosuvastatin Calcium) .... One By Mouth  Monday and Friday ( Twice A Week) 6)  Vicodin 5-500 Mg Tabs (Hydrocodone-Acetaminophen) .... One By Mouth Q 4-6 Hours As  Needed Pain  Allergies (verified): 1)  * Atuss  Past History:  Family History: Last updated: 16-Sep-2007 father died from sideroblastic anemia mother died from dementia sister with anurysm  Social History: Last updated: 09/16/07 Married Former Smoker Alcohol use-no Drug use-no Regular exercise-yes  Risk Factors: Exercise: yes (September 16, 2007)  Risk Factors: Smoking Status: quit (02/20/2010)  Past medical, surgical, family and social histories (including risk factors) reviewed, and no changes noted (except as noted below).  Past Medical History: Reviewed history from 04/13/2008 and no  changes required. Allergic rhinitis Skin cancer, hx of Hypertension Low back pain carple tunnel  Past Surgical History: Reviewed history from 05/09/2007 and no changes required. knee arthroscopy Appendectomy Cholecystectomy Sinus surgery Vasectomy Lumbar laminectomy Lumbar fusion rectal fisure joint replacement hand  Family History: Reviewed history from Sep 16, 2007 and no changes required. father died from sideroblastic anemia mother died from dementia sister with anurysm  Social History: Reviewed history from 09/16/2007 and no changes required. Married Former Smoker Alcohol use-no Drug use-no Regular exercise-yes  Review of Systems  The patient denies anorexia, fever, weight loss, weight gain, vision loss, decreased hearing, hoarseness, chest pain, syncope, dyspnea on exertion, peripheral edema, prolonged cough, headaches, hemoptysis, abdominal pain, melena, hematochezia, severe indigestion/heartburn, hematuria, incontinence, genital sores, muscle weakness, suspicious skin lesions, transient blindness, difficulty walking, depression, unusual weight change, abnormal bleeding, enlarged lymph nodes, angioedema, and breast masses.    Physical Exam  General:  Well-developed,well-nourished,in no acute distress; alert,appropriate and cooperative throughout examination Eyes:  pupils equal, pupils round, and pupils reactive to light.   Ears:  R ear normal and L ear normal.   Neck:  No deformities, masses, or tenderness noted. Lungs:  normal respiratory effort and no wheezes.   Heart:  normal rate and regular rhythm.   Abdomen:  soft and non-tender.   Msk:  joint tenderness, joint swelling, and joint warmth.   Extremities:  No clubbing, cyanosis, edema, or deformity noted with normal full range of motion of all joints.   Neurologic:  No cranial nerve deficits noted. Station and gait are normal. Plantar reflexes are down-going bilaterally. DTRs are symmetrical throughout.  Sensory, motor and coordinative functions appear intact.   Impression & Recommendations:  Problem # 1:  ERECTILE DYSFUNCTION, ORGANIC (ICD-607.84) the pt has failed viagra  Orders: Durable Medical Equipment (DME)  Discussed proper use of medications, as well as side effects.   Problem # 2:  GOUT, UNSPECIFIED (ICD-274.9) Assessment: Unchanged Informed consent obtained and then the righ great toe joint was prepped in a sterile manor and 40 mg depo and 1/2 cc 1% lidocaine injected into the synovial space. After care discussed. Pt tolerated procedure well.  Elevate extremity; warm compresses, symptomatic relief and medication as directed.   Orders: Joint Aspirate / Injection, Small (57846) Depo-Medrol 20mg  (J1020)  Problem # 3:  FOOT PAIN (ICD-729.5) due to gout  Problem # 4:  HYPERTENSION (ICD-401.9) stable His updated medication list for this problem includes:    Taztia Xt 300 Mg Cp24 (Diltiazem hcl er beads) ..... Once daily    Micardis Hct 80-25 Mg Tabs (Telmisartan-hctz) ..... Once daily  BP today: 140/80 Prior BP: 144/80 (11/21/2009)  Prior 10 Yr Risk Heart Disease: N/A (2007/09/16)  Labs Reviewed: K+: 4.0 (01/16/2008) Creat: : 1.0 (01/16/2008)   Chol: 168 (05/16/2009)   HDL: 46.20 (05/16/2009)   LDL: 111 (05/16/2009)   TG: 56.0 (05/16/2009)  Complete Medication List: 1)  Astelin 137 Mcg/spray Soln (Azelastine hcl) .... Two times a day 2)  Peyton Bottoms  Xt 300 Mg Cp24 (Diltiazem hcl er beads) .... Once daily 3)  Triamcinolone Acetonide 0.5 % Crea (Triamcinolone acetonide) .... As directed 4)  Micardis Hct 80-25 Mg Tabs (Telmisartan-hctz) .... Once daily 5)  Crestor 10 Mg Tabs (Rosuvastatin calcium) .... One by mouth  monday and friday ( twice a week) 6)  Vicodin 5-500 Mg Tabs (Hydrocodone-acetaminophen) .... One by mouth q 4-6 hours as needed pain 7)  Methylprednisolone (pak) 4 Mg Tabs (Methylprednisolone) .... Take as directed  Patient Instructions: 1)  Please schedule a  follow-up appointment in 3 months. medicare wellness exam Prescriptions: METHYLPREDNISOLONE (PAK) 4 MG TABS (METHYLPREDNISOLONE) take as directed  #1 4mg  pk x 0   Entered and Authorized by:   Stacie Glaze MD   Signed by:   Stacie Glaze MD on 02/20/2010   Method used:   [id number]       [address]       [place], [place]  [address]       Ph: [phone] or [phone]       Fax: [phone]   RxID:   [id number]   Appended Document: 3 month fup//ccm     Allergies: 1)  * Atuss   Impression & Recommendations:  Problem # 1:  NEOPLASM, MALIGNANT, SKIN, TRUNK (ICD-173.5)  pt was preped in a sterile manor and informed consent obtained. Using a 15 blade the lesion was removed and sent for pathology. sterile dressings were applied  and wound care discussed with the pt.  Orders: Excise Malig lesion (FEENL) 0 - 0.5 cm (11640)  Complete Medication List: 1)  Astelin 137 Mcg/spray Soln (Azelastine hcl) .... Two times a day 2)  Taztia Xt 300 Mg Cp24 (Diltiazem hcl er beads) .... Once daily 3)  Triamcinolone Acetonide 0.5 % Crea (Triamcinolone acetonide) .... As directed 4)  Micardis Hct 80-25 Mg Tabs (Telmisartan-hctz) .... Once daily 5)  Crestor 10 Mg Tabs (Rosuvastatin calcium) .... One by mouth  monday and friday ( twice a week) 6)  Vicodin 5-500 Mg Tabs (Hydrocodone-acetaminophen) .... One by mouth q 4-6 hours as needed pain 7)  Methylprednisolone (pak) 4 Mg Tabs (Methylprednisolone) .... Take as directed  Appended Document: 3 month fup//ccm     History of Present Illness: The pt has a hx of malignant and premalignant lesons with remaval of BCCA in past new lesion could be a BCCA  Allergies: 1)  * Atuss   Impression & Recommendations:  Problem # 1:  NEOPLASM, MALIGNANT, SKIN, TRUNK (ICD-173.5) pathology showed that this lesion did not transform and although there was keratinocye atypia there  was not extension to the basal cells  Complete Medication List: 1)  Astelin 137 Mcg/spray Soln (Azelastine hcl) .... Two times a day 2)  Taztia Xt 300 Mg Cp24 (Diltiazem hcl er beads) .... Once daily 3)  Triamcinolone Acetonide 0.5 % Crea (Triamcinolone acetonide) .... As directed 4)  Micardis Hct 80-25 Mg Tabs (Telmisartan-hctz) .... Once daily 5)  Crestor 10 Mg Tabs (Rosuvastatin calcium) .... One by mouth  monday and friday ( twice a week) 6)  Vicodin 5-500 Mg Tabs (Hydrocodone-acetaminophen) .... One by mouth q 4-6 hours as needed pain 7)  Methylprednisolone (pak) 4 Mg Tabs (Methylprednisolone) .... Take as directed

## 2010-09-12 NOTE — Assessment & Plan Note (Signed)
Summary: flu shot/ccm  Nurse Visit    Prior Medications: ASTELIN 137 MCG/SPRAY SOLN (AZELASTINE HCL) two times a day TAZTIA XT 300 MG CP24 (DILTIAZEM HCL ER BEADS) once daily TRIAMCINOLONE ACETONIDE 0.5 % CREA (TRIAMCINOLONE ACETONIDE) as directed MICARDIS HCT 80-25 MG  TABS (TELMISARTAN-HCTZ) once daily    Influenza Vaccine    Vaccine Type: Fluvax MCR    Given by: Alfred Levins, CMA  Flu Vaccine Consent Questions    Do you have a history of severe allergic reactions to this vaccine? no    Any prior history of allergic reactions to egg and/or gelatin? no    Do you have a sensitivity to the preservative Thimersol? no    Do you have a past history of Guillan-Barre Syndrome? no    Do you currently have an acute febrile illness? no    Have you ever had a severe reaction to latex? no    Vaccine information given and explained to patient? yes   Impression & Recommendations:  lot [id number], EXP 30 jun 09, [employer] pasteur left deltoid IM, 0.5 cc.  Complete Medication List: 1)  Astelin 137 Mcg/spray Soln (Azelastine hcl) .... Two times a day 2)  Taztia Xt 300 Mg Cp24 (Diltiazem hcl er beads) .... Once daily 3)  Triamcinolone Acetonide 0.5 % Crea (Triamcinolone acetonide) .... As directed 4)  Micardis Hct 80-25 Mg Tabs (Telmisartan-hctz) .... Once daily   Orders Added: 1)  Influenza Vaccine MCR [00025]    ]

## 2010-09-12 NOTE — Assessment & Plan Note (Signed)
Summary: 3 MONTH ROV/NJR   Vital Signs:  Patient Profile:   77 Years Old Male Height:     72 inches Weight:      205 pounds Temp:     98.7 degrees F oral Pulse rate:   70 / minute Resp:     14 per minute BP sitting:   134 / 76  (left arm)  Vitals Entered By: Willy Eddy, LPN (July 13, 2008 9:52 AM)                 Chief Complaint:  roa- bp check.  History of Present Illness: Discusiion of chronic problems including but not limited to HTN, hyperlipidemia and ED Back pain is stable  Hypertension History:      He denies headache, chest pain, palpitations, dyspnea with exertion, orthopnea, PND, peripheral edema, visual symptoms, neurologic problems, syncope, and side effects from treatment.  He notes no problems with any antihypertensive medication side effects.  Further comments include: stable.        Positive major cardiovascular risk factors include male age 75 years old or older, hyperlipidemia, and hypertension.  Negative major cardiovascular risk factors include non-tobacco-user status.        Positive history for target organ damage include ASHD (either angina; prior MI; prior CABG).       Prior Medication List:  ASTELIN 137 MCG/SPRAY SOLN (AZELASTINE HCL) two times a day TAZTIA XT 300 MG CP24 (DILTIAZEM HCL ER BEADS) once daily TRIAMCINOLONE ACETONIDE 0.5 % CREA (TRIAMCINOLONE ACETONIDE) as directed MICARDIS HCT 80-25 MG  TABS (TELMISARTAN-HCTZ) once daily VICODIN HP 10-660 MG  TABS (HYDROCODONE-ACETAMINOPHEN) 1 every 6-8 hours as needed pain ZITHROMAX Z-PAK 250 MG TABS (AZITHROMYCIN) Use as directed ATUSS DS 30-4-30 MG/5ML SUSP (PSEUDOEPHED HCL-CPM-DM HBR TAN) 2 tsp every 12 hours as needed cough   Current Allergies (reviewed today): * ATUSS  Past Medical History:    Reviewed history from 04/13/2008 and no changes required:       Allergic rhinitis       Skin cancer, hx of       Hypertension       Low back pain       carple tunnel   Family  History:    Reviewed history from 08/18/2007 and no changes required:       father died from sideroblastic anemia       mother died from dementia       sister with anurysm  Social History:    Reviewed history from 08/18/2007 and no changes required:       Married       Former Smoker       Alcohol use-no       Drug use-no       Regular exercise-yes    Review of Systems       The patient complains of dyspnea on exertion.  The patient denies anorexia, fever, weight loss, weight gain, vision loss, decreased hearing, hoarseness, chest pain, syncope, peripheral edema, prolonged cough, headaches, hemoptysis, abdominal pain, melena, hematochezia, severe indigestion/heartburn, hematuria, incontinence, genital sores, muscle weakness, suspicious skin lesions, transient blindness, difficulty walking, depression, unusual weight change, abnormal bleeding, enlarged lymph nodes, angioedema, breast masses, and testicular masses.     Physical Exam  General:     Well-developed,well-nourished,in no acute distress; alert,appropriate and cooperative throughout examination Eyes:     No corneal or conjunctival inflammation noted. EOMI. Perrla. Funduscopic exam benign, without hemorrhages, exudates or papilledema. Vision grossly normal. Nose:  External nasal examination shows no deformity or inflammation. Nasal mucosa are pink and moist without lesions or exudates. Mouth:     Oral mucosa and oropharynx without lesions or exudates.  Teeth in good repair. Neck:     No deformities, masses, or tenderness noted. Lungs:     Normal respiratory effort, chest expands symmetrically. Lungs are clear to auscultation, no crackles or wheezes. Heart:     normal rate and regular rhythm.   Abdomen:     Bowel sounds positive,abdomen soft and non-tender without masses, organomegaly or hernias noted. Msk:     had hand surgeryno joint swelling and no joint warmth.   Neurologic:     alert & oriented X3 and cranial  nerves II-XII intact.      Impression & Recommendations:  Problem # 1:  HYPERTENSION (ICD-401.9) Assessment: Unchanged  His updated medication list for this problem includes:    Taztia Xt 300 Mg Cp24 (Diltiazem hcl er beads) ..... Once daily    Micardis Hct 80-25 Mg Tabs (Telmisartan-hctz) ..... Once daily  BP today: 134/76 Prior BP: 140/80 (04/13/2008)  Prior 10 Yr Risk Heart Disease: N/A (08/18/2007)  Labs Reviewed: Creat: 1.0 (01/16/2008) Chol: 218 (01/16/2008)   HDL: 62.0 (01/16/2008)   LDL: 140.8 (01/16/2008)   TG: 84 (01/16/2008)   Problem # 2:  ALLERGIC RHINITIS (ICD-477.9) Assessment: Unchanged  His updated medication list for this problem includes:    Astelin 137 Mcg/spray Soln (Azelastine hcl) .Marland Kitchen..Marland Kitchen Two times a day Discussed use of allergy medications and environmental measures.   Problem # 3:  CAD (ICD-414.00) Assessment: Unchanged  His updated medication list for this problem includes:    Taztia Xt 300 Mg Cp24 (Diltiazem hcl er beads) ..... Once daily    Micardis Hct 80-25 Mg Tabs (Telmisartan-hctz) ..... Once daily  Labs Reviewed: Chol: 218 (01/16/2008)   HDL: 62.0 (01/16/2008)   LDL: 140.8 (01/16/2008)   TG: 84 (01/16/2008)   Problem # 4:  HYPERLIPIDEMIA (ICD-272.4)  Labs Reviewed: Chol: 218 (01/16/2008)   HDL: 62.0 (01/16/2008)   LDL: 140.8 (01/16/2008)   TG: 84 (01/16/2008) SGOT: 24 (01/16/2008)   SGPT: 38 (01/16/2008)  Prior 10 Yr Risk Heart Disease: N/A (08/18/2007)   Complete Medication List: 1)  Astelin 137 Mcg/spray Soln (Azelastine hcl) .... Two times a day 2)  Taztia Xt 300 Mg Cp24 (Diltiazem hcl er beads) .... Once daily 3)  Triamcinolone Acetonide 0.5 % Crea (Triamcinolone acetonide) .... As directed 4)  Micardis Hct 80-25 Mg Tabs (Telmisartan-hctz) .... Once daily  Hypertension Assessment/Plan:      The patient's hypertensive risk group is category C: Target organ damage and/or diabetes.  Today's blood pressure is 134/76.  His blood  pressure goal is < 140/90.   Patient Instructions: 1)  Please schedule a follow-up appointment in 3 months. 2)  Hepatic Panel prior to visit, ICD-9:995.20 3)  Lipid Panel prior to visit, ICD-9:272.4 4)  PSA prior to visit, ICD-9:601.0   Prescriptions: TRIAMCINOLONE ACETONIDE 0.5 % CREA (TRIAMCINOLONE ACETONIDE) as directed  #60 Gram x 6   Entered and Authorized by:   Stacie Glaze MD   Signed by:   Stacie Glaze MD on 07/13/2008   Method used:   [facility]  [address] [id number]       [address]       [place], [place]  [address]       Ph:  [phone] or [phone]       Fax: [phone]   RxID:   [id number]  ]

## 2010-09-12 NOTE — Progress Notes (Signed)
Summary: Chest congestion with rattling  Phone Note Call from [name]   Caller: [name] Call For: Stacie Glaze MD Reason for Call: Acute Illness, Referral Summary of Call: Pt started with chest congestion 2 weeks ago which has become worse with "rattling in chest", and productive cough that is much worse when he lies down.  He could not sleep last night unless he was sitting up.  Does not feel ill or SOB.  Took a course of Prednisone for gout that was finished 2 days ago.  No fever. [phone] Initial call taken by: Lynann Beaver CMA,  June 17, 2008 8:36 AM  Follow-up for Phone Call        TALKED WITH PT AND HE STATES NO SWELLING - HE HAD TO SIT UP AND SLEEP BECAUSE THE CONGESTION IN HIS THROAT , BUT NO SOB OR DIFFICULTY BREATHING.[facility] [address]  Follow-up by: Willy Eddy, LPN,  June 17, 2008 9:21 AM  Additional Follow-up for Phone Call Additional follow up Details #1::        PER DR Lovell Sheehan- Z PACK AND ATUSS.SENT IN Additional Follow-up by: Willy Eddy, LPN,  June 17, 2008 9:23 AM    New/Updated Medications: ZITHROMAX Z-PAK 250 MG TABS (AZITHROMYCIN) Use as directed ATUSS DS 30-4-30 MG/5ML SUSP (PSEUDOEPHED HCL-CPM-DM HBR TAN) 2 tsp every 12 hours as needed cough   Prescriptions: ATUSS DS 30-4-30 MG/5ML SUSP (PSEUDOEPHED HCL-CPM-DM HBR TAN) 2 tsp every 12 hours as needed cough  #6oz x 0   Entered by:   Willy Eddy, LPN   Authorized by:   Stacie Glaze MD   Signed by:   Willy Eddy, LPN on 96/29/5284   Method used:   [facility]        [facility]  [address] [id number]       [address]       [place], [place]  [address]       Ph: [phone] or [phone]       Fax: [phone]   RxID:   [id number] ZITHROMAX Z-PAK 250 MG TABS (AZITHROMYCIN) Use as directed  #1 x 0   Entered by:   Willy Eddy, LPN   Authorized by:   Stacie Glaze MD   Signed by:   Willy Eddy, LPN on  32/35/5732   Method used:   [facility]ically to        [facility]  [address] [id number]       [address]       [place], [place]  [address]       Ph: [phone] or [phone]       Fax: [phone]   RxID:   [id number]

## 2010-09-12 NOTE — Progress Notes (Signed)
Summary: forehead lesion  Phone Note Call from Patient   Caller: Patient Call For: Stacie Glaze MD Summary of Call: Pt. has a lesion on his forehead that is red and painful.  Is asking if he should see a Dermatologist or Dr. Leretha Pol?  Per Dr. Lovell Sheehan, start Keflex 500 mg. one by mouth three times a day x 7 days.  Appt with Dr. Lovell Sheehan next week. [facility] [address]). [phone] Initial call taken by: Lynann Beaver CMA,  September 16, 2009 9:19 AM    New/Updated Medications: KEFLEX 500 MG CAPS (CEPHALEXIN) one by mouth three times a day x 7 days. Prescriptions: KEFLEX 500 MG CAPS (CEPHALEXIN) one by mouth three times a day x 7 days.  #21 x 0   Entered by:   Lynann Beaver CMA   Authorized by:   Stacie Glaze MD   Signed by:   Lynann Beaver CMA on 09/16/2009   Method used:   [facility]ronically to        [facility]  [address] [id number]       [address]       [place], [place]  [address]       Ph: [phone] or [phone]       Fax: [phone]   RxID:   [id number]

## 2010-09-12 NOTE — Assessment & Plan Note (Signed)
Summary: check lesion on forehead/dm/PT RESCD//CCM   Vital Signs:  Patient profile:   77 year old male Height:      72 inches Weight:      214 pounds BMI:     29.13 Temp:     98.2 degrees F oral Pulse rate:   72 / minute Resp:     14 per minute BP sitting:   140 / 80  (left arm)  Vitals Entered By: Willy Eddy, LPN (September 20, 2009 3:33 PM) CC: reddened area on forehead where cryo treatment was- much improved with keflex and encouraged to complete keflex    CC:  reddened area on forehead where cryo treatment was- much improved with keflex and encouraged to complete keflex .  History of Present Illness: new lesion apart from the reckeck of the prior leson fro last visit  Preventive Screening-Counseling & Management  Alcohol-Tobacco     Smoking Status: quit  Allergies: 1)  * Atuss  Physical Exam  General:  Well-developed,well-nourished,in no acute distress; alert,appropriate and cooperative throughout examination Skin:  new leson on fore head the prior lesion was reacting as expected without signs of infections   Impression & Recommendations:  Problem # 1:  ACTINIC KERATOSIS, HEAD (ICD-702.0)  the lesion was identifies as a      ak    and 40 seconds of cryotherapy with the liguid nitrogen gun was apllied to the site. The pt tolerated the procedure and post procedure care was discussed  Orders: Cryotherapy/Destruction benign or premalignant lesion (1st lesion)  ([address])  Complete Medication List: 1)  Astelin 137 Mcg/spray Soln (Azelastine hcl) .... Two times a day 2)  Taztia Xt 300 Mg Cp24 (Diltiazem hcl er beads) .... Once daily 3)  Triamcinolone Acetonide 0.5 % Crea (Triamcinolone acetonide) .... As directed 4)  Micardis Hct 80-25 Mg Tabs (Telmisartan-hctz) .... Once daily 5)  Crestor 10 Mg Tabs (Rosuvastatin calcium) .... One by mouth  monday and friday ( twice a week) 6)  Keflex 500 Mg Caps (Cephalexin) .... One by mouth three times a day x 7 days.

## 2010-09-12 NOTE — Assessment & Plan Note (Signed)
Summary: 3 MONTH ROA/JLS   Vital Signs:  Patient Profile:   77 Years Old Male Height:     72 inches Weight:      208 pounds Temp:     98.4 degrees F oral Pulse rate:   72 / minute Resp:     14 per minute BP sitting:   130 / 82  (left arm)  Vitals Entered By: Willy Eddy, LPN (October 15, 100 9:15 AM)                 Chief Complaint:  roa/bp check -bp may be up due to wife had back surgery llast night.  History of Present Illness: Current Problems:  CAD (ICD-414.00)  no chest pain NEOPLASM, MALIGNANT, SKIN, TRUNK (ICD-173.5) HYPERTENSION (ICD-401.9)  stable LOW BACK PAIN (ICD-724.2)   increased pain SKIN CANCER, HX OF (ICD-V10.83) ALLERGIC RHINITIS (ICD-477.9)    Hypertension History:      He denies headache, chest pain, palpitations, dyspnea with exertion, orthopnea, PND, peripheral edema, visual symptoms, neurologic problems, syncope, and side effects from treatment.        Positive major cardiovascular risk factors include male age 44 years old or older and hypertension.  Negative major cardiovascular risk factors include non-tobacco-user status.        Positive history for target organ damage include ASHD (either angina; prior MI; prior CABG).       Prior Medication List:  ASTELIN 137 MCG/SPRAY SOLN (AZELASTINE HCL) two times a day TAZTIA XT 300 MG CP24 (DILTIAZEM HCL ER BEADS) once daily TRIAMCINOLONE ACETONIDE 0.5 % CREA (TRIAMCINOLONE ACETONIDE) as directed MICARDIS HCT 80-25 MG  TABS (TELMISARTAN-HCTZ) once daily ZITHROMAX Z-PAK 250 MG  TABS (AZITHROMYCIN)  HYCODAN 5-1.5 MG/5ML  SYRP (HYDROCODONE-HOMATROPINE) 2tsp by mouth q 4-6hrs   Current Allergies (reviewed today): No known allergies   Past Medical History:    Reviewed history from 05/09/2007 and no changes required:       Allergic rhinitis       Skin cancer, hx of       Hypertension       Low back pain  Past Surgical History:    Reviewed history from 05/09/2007 and no changes required:      knee arthroscopy       Appendectomy       Cholecystectomy       Sinus surgery       Vasectomy       Lumbar laminectomy       Lumbar fusion       rectal fisure       joint replacement hand   Family History:    Reviewed history from 08/18/2007 and no changes required:       father died from sideroblastic anemia       mother died from dementia       sister with anurysm  Social History:    Reviewed history from 08/18/2007 and no changes required:       Married       Former Smoker       Alcohol use-no       Drug use-no       Regular exercise-yes     Physical Exam  General:     Well-developed,well-nourished,in no acute distress; alert,appropriate and cooperative throughout examination Head:     Normocephalic and atraumatic without obvious abnormalities. No apparent alopecia or balding. Eyes:     No corneal or conjunctival inflammation noted. EOMI. Perrla. Funduscopic exam benign, without  hemorrhages, exudates or papilledema. Vision grossly normal. Nose:     External nasal examination shows no deformity or inflammation. Nasal mucosa are pink and moist without lesions or exudates. Mouth:     Oral mucosa and oropharynx without lesions or exudates.  Teeth in good repair. Neck:     No deformities, masses, or tenderness noted. Lungs:     Normal respiratory effort, chest expands symmetrically. Lungs are clear to auscultation, no crackles or wheezes. Heart:     normal rate and regular rhythm.   Abdomen:     Bowel sounds positive,abdomen soft and non-tender without masses, organomegaly or hernias noted. Msk:     had hand surgeryno joint swelling and no joint warmth.   Pulses:     R and L carotid,radial,femoral,dorsalis pedis and posterior tibial pulses are full and equal bilaterally Extremities:     trace left pedal edema and trace right pedal edema.   Neurologic:     alert & oriented X3 and cranial nerves II-XII intact.      Impression & Recommendations:  Problem #  1:  CAD (ICD-414.00)  His updated medication list for this problem includes:    Taztia Xt 300 Mg Cp24 (Diltiazem hcl er beads) ..... Once daily    Micardis Hct 80-25 Mg Tabs (Telmisartan-hctz) ..... Once daily  Labs Reviewed: Chol: 223 (08/18/2007)   HDL: 50.4 (08/18/2007)   LDL: DEL (08/18/2007)   TG: 95 (08/18/2007)   Problem # 2:  HYPERTENSION (ICD-401.9)  His updated medication list for this problem includes:    Taztia Xt 300 Mg Cp24 (Diltiazem hcl er beads) ..... Once daily    Micardis Hct 80-25 Mg Tabs (Telmisartan-hctz) ..... Once daily  BP today: 130/82 Prior BP: 140/86 (08/18/2007)  Prior 10 Yr Risk Heart Disease: N/A (08/18/2007)  Labs Reviewed: Creat: 1.3 (08/18/2007) Chol: 223 (08/18/2007)   HDL: 50.4 (08/18/2007)   LDL: DEL (08/18/2007)   TG: 95 (08/18/2007)   Problem # 3:  LOW BACK PAIN (ICD-724.2) Discussed use of moist heat or ice, modified activities, medications, and stretching/strengthening exercises. Back care instructions given. To be seen in 2 weeks if no improvement; sooner if worsening of symptoms.   Complete Medication List: 1)  Astelin 137 Mcg/spray Soln (Azelastine hcl) .... Two times a day 2)  Taztia Xt 300 Mg Cp24 (Diltiazem hcl er beads) .... Once daily 3)  Triamcinolone Acetonide 0.5 % Crea (Triamcinolone acetonide) .... As directed 4)  Micardis Hct 80-25 Mg Tabs (Telmisartan-hctz) .... Once daily  Other Orders: TLB-PSA (Prostate Specific Antigen) (84153-PSA)  Hypertension Assessment/Plan:      The patient's hypertensive risk group is category C: Target organ damage and/or diabetes.  Today's blood pressure is 130/82.  His blood pressure goal is < 140/90.   Patient Instructions: 1)  Please schedule a follow-up appointment in 3 months.    ]

## 2010-09-12 NOTE — Assessment & Plan Note (Signed)
Summary: 3 month rov/njr   Vital Signs:  Patient Profile:   77 Years Old Male Height:     72 inches Weight:      213 pounds Temp:     98.4 degrees F oral Pulse rate:   76 / minute Resp:     14 per minute BP sitting:   160 / 82  (left arm)  Vitals Entered By: Willy Eddy, LPN (October 18, 452 8:14 AM)                 Chief Complaint:  ro labs-cryo area on head.  History of Present Illness: increased pain  with carple tunnel  on right hand, has seen sypher in the past for      Current Allergies: * ATUSS  Past Medical History:    Reviewed history from 04/13/2008 and no changes required:       Allergic rhinitis       Skin cancer, hx of       Hypertension       Low back pain       carple tunnel  Past Surgical History:    Reviewed history from 05/09/2007 and no changes required:       knee arthroscopy       Appendectomy       Cholecystectomy       Sinus surgery       Vasectomy       Lumbar laminectomy       Lumbar fusion       rectal fisure       joint replacement hand   Family History:    Reviewed history from 08/18/2007 and no changes required:       father died from sideroblastic anemia       mother died from dementia       sister with anurysm  Social History:    Reviewed history from 08/18/2007 and no changes required:       Married       Former Smoker       Alcohol use-no       Drug use-no       Regular exercise-yes   Risk Factors: Tobacco use:  quit Drug use:  no Alcohol use:  no Exercise:  yes   Review of Systems  The patient denies anorexia, fever, weight loss, weight gain, vision loss, decreased hearing, hoarseness, chest pain, syncope, dyspnea on exertion, peripheral edema, prolonged cough, headaches, hemoptysis, abdominal pain, melena, hematochezia, severe indigestion/heartburn, hematuria, incontinence, genital sores, muscle weakness, suspicious skin lesions, transient blindness, difficulty walking, depression, unusual weight  change, abnormal bleeding, enlarged lymph nodes, angioedema, breast masses, and testicular masses.     Physical Exam  General:     Well-developed,well-nourished,in no acute distress; alert,appropriate and cooperative throughout examination Eyes:     pupils equal and pupils round.   Ears:     R ear normal and L ear normal.   Nose:     no external deformity and no nasal discharge.   Mouth:     pharynx pink and moist and no erythema.   Neck:     No deformities, masses, or tenderness noted. Lungs:     normal respiratory effort and no wheezes.   Heart:     normal rate and regular rhythm.   Abdomen:     Bowel sounds positive,abdomen soft and non-tender without masses, organomegaly or hernias noted. Msk:     No deformity or scoliosis noted of  thoracic or lumbar spine.   Pulses:     R and L carotid,radial,femoral,dorsalis pedis and posterior tibial pulses are full and equal bilaterally Extremities:     No clubbing, cyanosis, edema, or deformity noted with normal full range of motion of all joints.   Neurologic:     alert & oriented X3.      Impression & Recommendations:  Problem # 1:  CAD (ICD-414.00)  His updated medication list for this problem includes:    Taztia Xt 300 Mg Cp24 (Diltiazem hcl er beads) ..... Once daily    Micardis Hct 80-25 Mg Tabs (Telmisartan-hctz) ..... Once daily  Labs Reviewed: Chol: 206 (10/11/2008)   HDL: 50.4 (10/11/2008)   LDL: 127.6 (10/11/2008)   TG: 72 (10/11/2008)   Problem # 2:  HYPERLIPIDEMIA, WITH HIGH HDL (ICD-272.4) add fish oil 2 caps by mouth BID Labs Reviewed: Chol: 206 (10/11/2008)   HDL: 50.4 (10/11/2008)   LDL: 127.6 (10/11/2008)   TG: 72 (10/11/2008) SGOT: 29 (10/11/2008)   SGPT: 35 (10/11/2008)  Prior 10 Yr Risk Heart Disease: N/A (08/18/2007)   Problem # 3:  ACTINIC KERATOSIS, HEAD (ICD-702.0) multiple sites on face    8 lesion on face and neck treated with liguid nitrogen, pt tolerated well and care instrcutions given  Orders: Cryotherapy/Destruction benign or premalignant lesion (1st lesion)  (17000) Cryotherapy/Destruction benign or premalignant lesion (2nd-14th lesions) (17003)   Problem # 4:  LOW BACK PAIN (ICD-724.2) resolved doing well with exercize Discussed use of moist heat or ice, modified activities, medications, and stretching/strengthening exercises. Back care instructions given. To be seen in 2 weeks if no improvement; sooner if worsening of symptoms.   Complete Medication List: 1)  Astelin 137 Mcg/spray Soln (Azelastine hcl) .... Two times a day 2)  Taztia Xt 300 Mg Cp24 (Diltiazem hcl er beads) .... Once daily 3)  Triamcinolone Acetonide 0.5 % Crea (Triamcinolone acetonide) .... As directed 4)  Micardis Hct 80-25 Mg Tabs (Telmisartan-hctz) .... Once daily 5)  Fish Oil Concentrate 1000 Mg Caps (Omega-3 fatty acids) .... Two by mouth two times a day  Other Orders: Orthopedic Surgeon Referral (Ortho Surgeon)   Patient Instructions: 1)  Please schedule a follow-up appointment in 3 months. 2)  Hepatic Panel prior to visit, ICD-9:995.20 3)  Lipid Panel prior to visit, ICD-9:272.4   Prescriptions: MICARDIS HCT 80-25 MG  TABS (TELMISARTAN-HCTZ) once daily  #30 x 11   Entered by:   Willy Eddy, LPN   Authorized by:   Stacie Glaze MD   Signed by:   Willy Eddy, LPN on 40/98/1191   Method used:   Electronically to        [facility]  [address] [id number]       [address]       [place], [place]  [address]       Ph: [phone] or [phone]       Fax: [phone]   RxID:   [id number] TRIAMCINOLONE ACETONIDE 0.5 % CREA (TRIAMCINOLONE ACETONIDE) as directed  #60 Gram x 6   Entered by:   Willy Eddy, LPN   Authorized by:   Stacie Glaze MD   Signed by:   Willy Eddy, LPN on 75/64/3329   Method used:   [facility] to        [facility]  [address] [id number]       [address]       [facility]  [place], [place]  [address]       Ph: [phone] or [phone]       Fax: [phone]   RxID:   [id number] TAZTIA XT 300 MG CP24 (DILTIAZEM HCL ER BEADS) once daily  #30 Capsule x 11   Entered by:   Willy Eddy, LPN   Authorized by:   Stacie Glaze MD   Signed by:   Willy Eddy, LPN on 43/32/9518   Method used:   [facility]ically to        [facility]  [address] [id number]       [address]       [place], [place]  [address]       Ph: [phone] or [phone]       Fax: [phone]   RxID:   [id number] ASTELIN 137 MCG/SPRAY SOLN (AZELASTINE HCL) two times a day  #1 x 11   Entered by:   Willy Eddy, LPN   Authorized by:   Stacie Glaze MD   Signed by:   Willy Eddy, LPN on 94/85/4627   Method used:   [facility]ically to        [facility]  [address] [id number]       [address]       [place], [place]  [address]       Ph: [phone] or [phone]       Fax: [phone]   RxID:   [id number]

## 2010-09-12 NOTE — Letter (Signed)
Summary: [facility]  [facility] note   Imported By: Kassie Mends 06/27/2007 15:50:49  _____________________________________________________________________  External Attachment:    Type:   Image     Comment:   [facility] note

## 2010-09-12 NOTE — Letter (Signed)
Summary: Request for Surgical Clearance/[facility]  Request for Surgical Clearance/[facility]   Imported By: Maryln Gottron 10/14/2009 14:21:59  _____________________________________________________________________  External Attachment:    Type:   Image     Comment:   External Document

## 2010-09-12 NOTE — Assessment & Plan Note (Signed)
Summary: 3 month ov /nta   Vital Signs:  Patient Profile:   77 Years Old Male Height:     72 inches Weight:      204 pounds Temp:     98.2 degrees F oral Pulse rate:   72 / minute Resp:     14 per minute BP sitting:   136 / 90  (left arm)  Vitals Entered By: Willy Eddy, LPN (January 15, 1609 8:06 AM)                 Chief Complaint:  ROA BP CHECK.  History of Present Illness:  Follow-Up Visit      This is a 77 year old man who presents for Follow-up visit.  had 50 cc of fluid and had cortisone injection with success by Supple.  The patient denies chest pain, palpitations, dizziness, syncope, low blood sugar symptoms, high blood sugar symptoms, edema, SOB, DOE, PND, and orthopnea.  Since the last visit the patient notes being seen by a specialist.  The patient reports taking meds as prescribed, monitoring BP, and dietary compliance.  When questioned about possible medication side effects, the patient notes fatigue and muscle aches.      Current Allergies: No known allergies   Past Medical History:    Reviewed history from 05/09/2007 and no changes required:       Allergic rhinitis       Skin cancer, hx of       Hypertension       Low back pain  Past Surgical History:    Reviewed history from 05/09/2007 and no changes required:       knee arthroscopy       Appendectomy       Cholecystectomy       Sinus surgery       Vasectomy       Lumbar laminectomy       Lumbar fusion       rectal fisure       joint replacement hand   Family History:    Reviewed history from 08/18/2007 and no changes required:       father died from sideroblastic anemia       mother died from dementia       sister with anurysm  Social History:    Reviewed history from 08/18/2007 and no changes required:       Married       Former Smoker       Alcohol use-no       Drug use-no       Regular exercise-yes    Review of Systems  The patient denies anorexia, fever, weight loss,  weight gain, vision loss, decreased hearing, hoarseness, chest pain, syncope, dyspnea on exertion, peripheral edema, prolonged cough, headaches, hemoptysis, abdominal pain, melena, hematochezia, severe indigestion/heartburn, hematuria, incontinence, genital sores, muscle weakness, suspicious skin lesions, transient blindness, difficulty walking, depression, unusual weight change, abnormal bleeding, enlarged lymph nodes, angioedema, breast masses, and testicular masses.     Physical Exam  General:     Well-developed,well-nourished,in no acute distress; alert,appropriate and cooperative throughout examination Head:     Normocephalic and atraumatic without obvious abnormalities. No apparent alopecia or balding. Ears:     External ear exam shows no significant lesions or deformities.  Otoscopic examination reveals clear canals, tympanic membranes are intact bilaterally without bulging, retraction, inflammation or discharge. Hearing is grossly normal bilaterally. Nose:     External nasal examination shows no  deformity or inflammation. Nasal mucosa are pink and moist without lesions or exudates. Mouth:     Oral mucosa and oropharynx without lesions or exudates.  Teeth in good repair. Neck:     No deformities, masses, or tenderness noted. Lungs:     Normal respiratory effort, chest expands symmetrically. Lungs are clear to auscultation, no crackles or wheezes. Heart:     normal rate and regular rhythm.   Abdomen:     Bowel sounds positive,abdomen soft and non-tender without masses, organomegaly or hernias noted. Msk:     had hand surgeryno joint swelling and no joint warmth.      Impression & Recommendations:  Problem # 1:  HYPERTENSION (ICD-401.9) Assessment: Unchanged  His updated medication list for this problem includes:    Taztia Xt 300 Mg Cp24 (Diltiazem hcl er beads) ..... Once daily    Micardis Hct 80-25 Mg Tabs (Telmisartan-hctz) ..... Once daily  BP today: 136/90 Prior BP:  130/82 (10/16/2007)  Prior 10 Yr Risk Heart Disease: N/A (08/18/2007)  Labs Reviewed: Creat: 1.3 (08/18/2007) Chol: 223 (08/18/2007)   HDL: 50.4 (08/18/2007)   LDL: DEL (08/18/2007)   TG: 95 (08/18/2007)  Orders: TLB-BMP (Basic Metabolic Panel-BMET) (80048-METABOL)   Problem # 2:  CAD (ICD-414.00) Assessment: Unchanged  His updated medication list for this problem includes:    Taztia Xt 300 Mg Cp24 (Diltiazem hcl er beads) ..... Once daily    Micardis Hct 80-25 Mg Tabs (Telmisartan-hctz) ..... Once daily  Labs Reviewed: Chol: 223 (08/18/2007)   HDL: 50.4 (08/18/2007)   LDL: DEL (08/18/2007)   TG: 95 (08/18/2007)  Orders: TLB-Lipid Panel (80061-LIPID)   Problem # 3:  ALLERGIC RHINITIS (ICD-477.9)  His updated medication list for this problem includes:    Astelin 137 Mcg/spray Soln (Azelastine hcl) .Marland Kitchen..Marland Kitchen Two times a day Discussed use of allergy medications and environmental measures.   Complete Medication List: 1)  Astelin 137 Mcg/spray Soln (Azelastine hcl) .... Two times a day 2)  Taztia Xt 300 Mg Cp24 (Diltiazem hcl er beads) .... Once daily 3)  Triamcinolone Acetonide 0.5 % Crea (Triamcinolone acetonide) .... As directed 4)  Micardis Hct 80-25 Mg Tabs (Telmisartan-hctz) .... Once daily 5)  Vicodin Hp 10-660 Mg Tabs (Hydrocodone-acetaminophen) .Marland Kitchen.. 1 every 6-8 hours as needed pain  Other Orders: TLB-Hepatic/Liver Function Pnl (80076-HEPATIC)   Patient Instructions: 1)  Please schedule a follow-up appointment in 3 months.   Prescriptions: MICARDIS HCT 80-25 MG  TABS (TELMISARTAN-HCTZ) once daily  #30 x 11   Entered by:   Willy Eddy, LPN   Authorized by:   Stacie Glaze MD   Signed by:   Willy Eddy, LPN on 16/05/9603   Method used:   Print then Give to Patient   RxID:   [id number] TRIAMCINOLONE ACETONIDE 0.5 % CREA (TRIAMCINOLONE ACETONIDE) as directed  #60 Gram x 6   Entered by:   Willy Eddy, LPN   Authorized by:   Stacie Glaze MD    Signed by:   Willy Eddy, LPN on 21/30/8657   Method used:   Print then Give to Patient   RxID:   [id number] TAZTIA XT 300 MG CP24 (DILTIAZEM HCL ER BEADS) once daily  #30 Capsule x 11   Entered by:   Willy Eddy, LPN   Authorized by:   Stacie Glaze MD   Signed by:   Willy Eddy, LPN on 08/15/7251   Method used:   Print then Give to Patient  RxID:   [id number] ASTELIN 137 MCG/SPRAY SOLN (AZELASTINE HCL) two times a day  #1 x 11   Entered by:   Willy Eddy, LPN   Authorized by:   Stacie Glaze MD   Signed by:   Willy Eddy, LPN on 75/64/3329   Method used:   Print then Give to Patient   RxID:   [id number]  ]  Appended Document: Orders Update    Clinical Lists Changes  Orders: Added new Test order of TLB-Lipid Panel (80061-LIPID) - Signed Added new Test order of TLB-Hepatic/Liver Function Pnl (80076-HEPATIC) - Signed Added new Test order of TLB-BMP (Basic Metabolic Panel-BMET) (80048-METABOL) - Signed

## 2010-09-12 NOTE — Progress Notes (Signed)
Summary: gout  Phone Note Call from Patient Call back at Work Phone    Caller: Patient Call For: [name] Reason for Call: Acute Illness Summary of Call: Pt is having symptoms of gout in right foot again, and would like Dr. Caryl Never to give him Prednisone again. [phone] [facility] Initial call taken by: Lynann Beaver CMA,  July 25, 2009 8:49 AM  Follow-up for Phone Call        per dr Lovell Sheehan- may refill prednisone as dr Caryl Never gave Follow-up by: Willy Eddy, LPN,  July 25, 2009 4:26 PM    New/Updated Medications: PREDNISONE 10 MG TABS (PREDNISONE) taper as follows 5tabs,5tabs,4 tabs,4 tabs,3,3,2,1 Prescriptions: PREDNISONE 10 MG TABS (PREDNISONE) taper as follows 5tabs,5tabs,4 tabs,4 tabs,3,3,2,1  #27 x 0   Entered by:   Willy Eddy, LPN   Authorized by:   Stacie Glaze MD   Signed by:   Willy Eddy, LPN on 19/14/7829   Method used:   [facility] to        [facility]  [address] [id number]       [address]       [place], [place]  [address]       Ph: [phone] or [phone]       Fax: [phone]   RxID:   [id number]

## 2010-09-12 NOTE — Assessment & Plan Note (Signed)
Summary: 3 mo rov/mm   Vital Signs:  Patient profile:   77 year old male Height:      72 inches Weight:      209 pounds BMI:     28.45 Temp:     98.2 degrees F oral Pulse rate:   76 / minute Resp:     14 per minute BP sitting:   144 / 80  (left arm)  Vitals Entered By: Willy Eddy, LPN (November 21, 2009 9:33 AM) CC: roa- bp ch eck, Hypertension Management   CC:  roa- bp ch eck and Hypertension Management.  History of Present Illness: [name] has surgery planned right knee TKR  Hypertension History:      He denies headache, chest pain, palpitations, dyspnea with exertion, orthopnea, PND, peripheral edema, visual symptoms, neurologic problems, syncope, and side effects from treatment.        Positive major cardiovascular risk factors include male age 41 years old or older, hyperlipidemia, and hypertension.  Negative major cardiovascular risk factors include non-tobacco-user status.        Positive history for target organ damage include ASHD (either angina/prior MI/prior CABG).      Preventive Screening-Counseling & Management  Alcohol-Tobacco     Smoking Status: quit  Problems Prior to Update: 1)  Knee Pain  (ICD-719.46) 2)  Patellar Tendinitis  (ICD-726.64) 3)  Foot Pain  (ICD-729.5) 4)  Hyperlipidemia, With High Hdl  (ICD-272.4) 5)  Carpal Tunnel Syndrome, Left  (ICD-354.0) 6)  Actinic Keratosis, Head  (ICD-702.0) 7)  Benign Prostatic Hypertrophy, With Urinary Obstruction  (ICD-600.01) 8)  Uns Advrs Eff Uns Rx Medicinal&biological Sbstnc  (ICD-995.20) 9)  Cad  (ICD-414.00) 10)  Neoplasm, Malignant, Skin, Trunk  (ICD-173.5) 11)  Hypertension  (ICD-401.9) 12)  Low Back Pain  (ICD-724.2) 13)  Skin Cancer, Hx of  (ICD-V10.83) 14)  Allergic Rhinitis  (ICD-477.9)  Current Problems (verified): 1)  Knee Pain  (ICD-719.46) 2)  Patellar Tendinitis  (ICD-726.64) 3)  Foot Pain  (ICD-729.5) 4)  Hyperlipidemia, With High Hdl  (ICD-272.4) 5)  Carpal Tunnel Syndrome, Left   (ICD-354.0) 6)  Actinic Keratosis, Head  (ICD-702.0) 7)  Benign Prostatic Hypertrophy, With Urinary Obstruction  (ICD-600.01) 8)  Uns Advrs Eff Uns Rx Medicinal&biological Sbstnc  (ICD-995.20) 9)  Cad  (ICD-414.00) 10)  Neoplasm, Malignant, Skin, Trunk  (ICD-173.5) 11)  Hypertension  (ICD-401.9) 12)  Low Back Pain  (ICD-724.2) 13)  Skin Cancer, Hx of  (ICD-V10.83) 14)  Allergic Rhinitis  (ICD-477.9)  Medications Prior to Update: 1)  Astelin 137 Mcg/spray Soln (Azelastine Hcl) .... Two Times A Day 2)  Taztia Xt 300 Mg Cp24 (Diltiazem Hcl Er Beads) .... Once Daily 3)  Triamcinolone Acetonide 0.5 % Crea (Triamcinolone Acetonide) .... As Directed 4)  Micardis Hct 80-25 Mg  Tabs (Telmisartan-Hctz) .... Once Daily 5)  Crestor 10 Mg Tabs (Rosuvastatin Calcium) .... One By Mouth  Monday and Friday ( Twice A Week) 6)  Keflex 500 Mg Caps (Cephalexin) .... One By Mouth Three Times A Day X 7 Days. 7)  Vicodin 5-500 Mg Tabs (Hydrocodone-Acetaminophen) .... One By Mouth Q 4-6 Hours As Needed Pain  Current Medications (verified): 1)  Astelin 137 Mcg/spray Soln (Azelastine Hcl) .... Two Times A Day 2)  Taztia Xt 300 Mg Cp24 (Diltiazem Hcl Er Beads) .... Once Daily 3)  Triamcinolone Acetonide 0.5 % Crea (Triamcinolone Acetonide) .... As Directed 4)  Micardis Hct 80-25 Mg  Tabs (Telmisartan-Hctz) .... Once Daily 5)  Crestor 10 Mg Tabs (  Rosuvastatin Calcium) .... One By Mouth  Monday and Friday ( Twice A Week) 6)  Vicodin 5-500 Mg Tabs (Hydrocodone-Acetaminophen) .... One By Mouth Q 4-6 Hours As Needed Pain  Allergies (verified): 1)  * Atuss  Past History:  Family History: Last updated: 08-27-07 father died from sideroblastic anemia mother died from dementia sister with anurysm  Social History: Last updated: 08-27-07 Married Former Smoker Alcohol use-no Drug use-no Regular exercise-yes  Risk Factors: Exercise: yes (08-27-07)  Risk Factors: Smoking Status: quit  (11/21/2009)  Past medical, surgical, family and social histories (including risk factors) reviewed, and no changes noted (except as noted below).  Past Medical History: Reviewed history from 04/13/2008 and no changes required. Allergic rhinitis Skin cancer, hx of Hypertension Low back pain carple tunnel  Past Surgical History: Reviewed history from 05/09/2007 and no changes required. knee arthroscopy Appendectomy Cholecystectomy Sinus surgery Vasectomy Lumbar laminectomy Lumbar fusion rectal fisure joint replacement hand  Family History: Reviewed history from 2007-08-27 and no changes required. father died from sideroblastic anemia mother died from dementia sister with anurysm  Social History: Reviewed history from 08/27/2007 and no changes required. Married Former Smoker Alcohol use-no Drug use-no Regular exercise-yes  Review of Systems  The patient denies anorexia, fever, weight loss, weight gain, vision loss, decreased hearing, hoarseness, chest pain, syncope, dyspnea on exertion, peripheral edema, prolonged cough, headaches, hemoptysis, abdominal pain, melena, hematochezia, severe indigestion/heartburn, hematuria, incontinence, genital sores, muscle weakness, suspicious skin lesions, transient blindness, difficulty walking, depression, unusual weight change, abnormal bleeding, enlarged lymph nodes, angioedema, breast masses, and testicular masses.    Physical Exam  General:  Well-developed,well-nourished,in no acute distress; alert,appropriate and cooperative throughout examination Eyes:  pupils equal, pupils round, and pupils reactive to light.   Ears:  R ear normal and L ear normal.   Lungs:  normal respiratory effort and no wheezes.   Heart:  normal rate and regular rhythm.   Abdomen:  soft and non-tender.   Msk:  normal ROM and no joint swelling.   Extremities:  right foot reveals some erythema and mild swelling and tenderness and slight work mostly  involving the second and third digit MTP joint. There is some redness extending to the mid forefoot. No obvious breaks in the skin. No ankle tenderness or swelling. No leg edema. Neurologic:  alert & oriented X3 and cranial nerves II-XII intact.   Skin:  well healed without recurance Cervical Nodes:  No lymphadenopathy noted Axillary Nodes:  No palpable lymphadenopathy   Impression & Recommendations:  Problem # 1:  KNEE PAIN (QQV-956.38) Assessment Deteriorated  TKR this wendesday at [facility] His updated medication list for this problem includes:    Vicodin 5-500 Mg Tabs (Hydrocodone-acetaminophen) ..... One by mouth q 4-6 hours as needed pain  Discussed strengthening exercises, use of ice or heat, and medications.   Problem # 2:  HYPERLIPIDEMIA, WITH HIGH HDL (ICD-272.4) Assessment: Unchanged  His updated medication list for this problem includes:    Crestor 10 Mg Tabs (Rosuvastatin calcium) ..... One by mouth  monday and friday ( twice a week)  Labs Reviewed: SGOT: 28 (05/16/2009)   SGPT: 36 (05/16/2009)  Lipid Goals: Chol Goal: 200 (08/22/2009)   HDL Goal: 40 (08/22/2009)   LDL Goal: 100 (08/22/2009)   TG Goal: 150 (08/22/2009)  Prior 10 Yr Risk Heart Disease: N/A (2007-08-27)   HDL:46.20 (05/16/2009), 56.80 (01/12/2009)  LDL:111 (05/16/2009), DEL (10/11/2008)  Chol:168 (05/16/2009), 227 (01/12/2009)  Trig:56.0 (05/16/2009), 93.0 (01/12/2009)  Problem # 3:  ACTINIC KERATOSIS, HEAD (  ICD-702.0) healed  Problem # 4:  HYPERTENSION (ICD-401.9)  His updated medication list for this problem includes:    Taztia Xt 300 Mg Cp24 (Diltiazem hcl er beads) ..... Once daily    Micardis Hct 80-25 Mg Tabs (Telmisartan-hctz) ..... Once daily  BP today: 144/80 Prior BP: 140/80 (09/20/2009)  Prior 10 Yr Risk Heart Disease: N/A (08/18/2007)  Labs Reviewed: K+: 4.0 (01/16/2008) Creat: : 1.0 (01/16/2008)   Chol: 168 (05/16/2009)   HDL: 46.20 (05/16/2009)   LDL: 111 (05/16/2009)   TG:  56.0 (05/16/2009)  Complete Medication List: 1)  Astelin 137 Mcg/spray Soln (Azelastine hcl) .... Two times a day 2)  Taztia Xt 300 Mg Cp24 (Diltiazem hcl er beads) .... Once daily 3)  Triamcinolone Acetonide 0.5 % Crea (Triamcinolone acetonide) .... As directed 4)  Micardis Hct 80-25 Mg Tabs (Telmisartan-hctz) .... Once daily 5)  Crestor 10 Mg Tabs (Rosuvastatin calcium) .... One by mouth  monday and friday ( twice a week) 6)  Vicodin 5-500 Mg Tabs (Hydrocodone-acetaminophen) .... One by mouth q 4-6 hours as needed pain  Hypertension Assessment/Plan:      The patient's hypertensive risk group is category C: Target organ damage and/or diabetes.  Today's blood pressure is 144/80.  His blood pressure goal is < 140/90.  Patient Instructions: 1)  Please schedule a follow-up appointment in 3 months.

## 2010-09-12 NOTE — Assessment & Plan Note (Signed)
Summary: 3 mo rov/mm   Vital Signs:  Patient profile:   77 year old male Height:      72 inches Weight:      212 pounds BMI:     28.86 Temp:     98.2 degrees F oral Pulse rate:   76 / minute Resp:     14 per minute BP sitting:   130 / 80  (left arm)  Vitals Entered By: Willy Eddy, LPN (January 19, 7061 11:17 AM)  CC:  roa labs after starting fish oil.  History of Present Illness: did not have any side effect but did not have a good response to the fish oil alone   Hypertension History:      He denies headache, chest pain, palpitations, dyspnea with exertion, orthopnea, PND, peripheral edema, visual symptoms, neurologic problems, syncope, and side effects from treatment.        Positive major cardiovascular risk factors include male age 36 years old or older, hyperlipidemia, and hypertension.  Negative major cardiovascular risk factors include non-tobacco-user status.        Positive history for target organ damage include ASHD (either angina/prior MI/prior CABG).     Problems Prior to Update: 1)  Hyperlipidemia, With High Hdl  (ICD-272.4) 2)  Carpal Tunnel Syndrome, Left  (ICD-354.0) 3)  Actinic Keratosis, Head  (ICD-702.0) 4)  Benign Prostatic Hypertrophy, With Urinary Obstruction  (ICD-600.01) 5)  Uns Advrs Eff Uns Rx Medicinal&biological Sbstnc  (ICD-995.20) 6)  Cad  (ICD-414.00) 7)  Neoplasm, Malignant, Skin, Trunk  (ICD-173.5) 8)  Hypertension  (ICD-401.9) 9)  Low Back Pain  (ICD-724.2) 10)  Skin Cancer, Hx of  (ICD-V10.83) 11)  Allergic Rhinitis  (ICD-477.9)  Medications Prior to Update: 1)  Astelin 137 Mcg/spray Soln (Azelastine Hcl) .... Two Times A Day 2)  Taztia Xt 300 Mg Cp24 (Diltiazem Hcl Er Beads) .... Once Daily 3)  Triamcinolone Acetonide 0.5 % Crea (Triamcinolone Acetonide) .... As Directed 4)  Micardis Hct 80-25 Mg  Tabs (Telmisartan-Hctz) .... Once Daily 5)  Fish Oil Concentrate 1000 Mg Caps (Omega-3 Fatty Acids) .... Two By Mouth Two Times A Day  Current Medications (verified): 1)  Astelin 137 Mcg/spray Soln (Azelastine Hcl) .... Two Times A Day 2)  Taztia Xt 300 Mg Cp24 (Diltiazem Hcl Er Beads) .... Once Daily 3)  Triamcinolone Acetonide 0.5 % Crea (Triamcinolone Acetonide) .... As Directed 4)  Micardis Hct 80-25 Mg  Tabs (Telmisartan-Hctz) .... Once Daily 5)  Fish Oil Concentrate 1000 Mg Caps (Omega-3 Fatty Acids) .... Two By Mouth Two Times A Day 6)  Crestor 10 Mg Tabs (Rosuvastatin Calcium) .... One By Mouth  Monday and Friday ( Twice A Week)  Allergies (verified): 1)  * Atuss  Past History:  Family History: Last updated: 2007/09/05 father died from sideroblastic anemia mother died from dementia sister with anurysm  Social History: Last updated: September 05, 2007 Married Former Smoker Alcohol use-no Drug use-no Regular exercise-yes  Risk Factors: Exercise: yes (09-05-07)  Risk Factors: Smoking Status: quit (09/05/2007)  Past medical, surgical, family and social histories (including risk factors) reviewed, and no changes noted (except as noted below).  Past Medical History: Reviewed history from 04/13/2008 and no changes required. Allergic rhinitis Skin cancer, hx of Hypertension Low back pain carple tunnel  Past Surgical History: Reviewed history from 05/09/2007 and no changes required. knee arthroscopy Appendectomy Cholecystectomy Sinus surgery Vasectomy Lumbar laminectomy Lumbar fusion rectal fisure joint replacement hand  Family History: Reviewed history from 09-05-07 and no changes  required. father died from sideroblastic anemia mother died from dementia sister with anurysm  Social History: Reviewed history from 08/18/2007 and no changes required. Married Former Smoker Alcohol use-no Drug use-no Regular exercise-yes  Review of Systems  The patient denies anorexia, fever, weight loss, weight gain, vision loss, decreased hearing, hoarseness, chest pain, syncope, dyspnea on exertion,  peripheral edema, prolonged cough, headaches, hemoptysis, abdominal pain, melena, hematochezia, severe indigestion/heartburn, hematuria, incontinence, genital sores, muscle weakness, suspicious skin lesions, transient blindness, difficulty walking, depression, unusual weight change, abnormal bleeding, enlarged lymph nodes, angioedema, and breast masses.    Physical Exam  General:  Well-developed,well-nourished,in no acute distress; alert,appropriate and cooperative throughout examination Eyes:  pupils equal and pupils round.   Ears:  R ear normal and L ear normal.   Mouth:  pharynx pink and moist and no erythema.   Neck:  No deformities, masses, or tenderness noted. Lungs:  normal respiratory effort and no wheezes.   Heart:  normal rate and regular rhythm.   Abdomen:  Bowel sounds positive,abdomen soft and non-tender without masses, organomegaly or hernias noted.   Impression & Recommendations:  Problem # 1:  HYPERLIPIDEMIA, WITH HIGH HDL (ICD-272.4) Assessment Deteriorated discussion of low does pulse statin Labs Reviewed: SGOT: 29 (10/11/2008)   SGPT: 35 (10/11/2008)  Prior 10 Yr Risk Heart Disease: N/A (08/18/2007)   HDL:50.4 (10/11/2008), 62.0 (01/16/2008)  LDL:DEL (10/11/2008), DEL (01/16/2008)  Chol:206 (10/11/2008), 218 (01/16/2008)  Trig:72 (10/11/2008), 84 (01/16/2008)  His updated medication list for this problem includes:    Crestor 10 Mg Tabs (Rosuvastatin calcium) ..... One by mouth  monday and friday ( twice a week)  Problem # 2:  HYPERTENSION (ICD-401.9) stable His updated medication list for this problem includes:    Taztia Xt 300 Mg Cp24 (Diltiazem hcl er beads) ..... Once daily    Micardis Hct 80-25 Mg Tabs (Telmisartan-hctz) ..... Once daily  BP today: 160/90 Prior BP: 160/82 (10/18/2008)  Prior 10 Yr Risk Heart Disease: N/A (08/18/2007)  Labs Reviewed: K+: 4.0 (01/16/2008) Creat: : 1.0 (01/16/2008)   Chol: 206 (10/11/2008)   HDL: 50.4 (10/11/2008)   LDL:  DEL (10/11/2008)   TG: 72 (10/11/2008)  Problem # 3:  LOW BACK PAIN (ICD-724.2) stable Discussed use of moist heat or ice, modified activities, medications, and stretching/strengthening exercises. Back care instructions given. To be seen in 2 weeks if no improvement; sooner if worsening of symptoms.   Complete Medication List: 1)  Astelin 137 Mcg/spray Soln (Azelastine hcl) .... Two times a day 2)  Taztia Xt 300 Mg Cp24 (Diltiazem hcl er beads) .... Once daily 3)  Triamcinolone Acetonide 0.5 % Crea (Triamcinolone acetonide) .... As directed 4)  Micardis Hct 80-25 Mg Tabs (Telmisartan-hctz) .... Once daily 5)  Fish Oil Concentrate 1000 Mg Caps (Omega-3 fatty acids) .... Two by mouth two times a day 6)  Crestor 10 Mg Tabs (Rosuvastatin calcium) .... One by mouth  monday and friday ( twice a week)  Hypertension Assessment/Plan:      The patient's hypertensive risk group is category C: Target organ damage and/or diabetes.  Today's blood pressure is 130/80.  His blood pressure goal is < 140/90.  Patient Instructions: 1)  Please schedule a follow-up appointment in 4 months. 2)  Hepatic Panel prior to visit, ICD-9:995.20 3)  Lipid Panel prior to visit, ICD-9:272.4 Prescriptions: MICARDIS HCT 80-25 MG  TABS (TELMISARTAN-HCTZ) once daily  #30 x 11   Entered and Authorized by:   Stacie Glaze MD   Signed by:  Stacie Glaze MD on 01/19/2009   Method used:   Electronically to        [facility]  [address] [id number] (retail)       [address]       [place], [place]  [address]       Ph: [phone] or [phone]       Fax: [phone]   RxID:   [id number] TRIAMCINOLONE ACETONIDE 0.5 % CREA (TRIAMCINOLONE ACETONIDE) as directed  #60 Gram x 6   Entered and Authorized by:   Stacie Glaze MD   Signed by:   Stacie Glaze MD on 01/19/2009   Method used:   Electronically to        [facility]  [address] [id number] (retail)       [address]       [place], [place]  [address]       Ph: [phone] or [phone]       Fax: [phone]   RxID:   [id number] TAZTIA XT 300 MG CP24 (DILTIAZEM HCL ER BEADS) once daily  #30 Capsule x 11   Entered and Authorized by:   Stacie Glaze MD   Signed by:   Stacie Glaze MD on 01/19/2009   Method used:   [facility]ically to        [facility]  [address] [id number] ([facility]       [address]       [place], [place]  [address]       Ph: [phone] or [phone]       Fax: [phone]   RxID:   [id number] ASTELIN 137 MCG/SPRAY SOLN (AZELASTINE HCL) two times a day  #1 x 11   Entered and Authorized by:   Stacie Glaze MD   Signed by:   Stacie Glaze MD on 01/19/2009   Method used:   [facility]ronically to        [facility]  [address] [id number] ([facility]ail)       [address]       [place], [place]  [address]       Ph: [phone] or [phone]       Fax: [phone]   RxID:   [id number]

## 2010-09-12 NOTE — Assessment & Plan Note (Signed)
Summary: FOLLOW UP W/FASTING LABS/IF   Vital Signs:  Patient Profile:   77 Years Old Male Weight:      205 pounds Temp:     98.4 degrees F oral Pulse rate:   78 / minute BP sitting:   140 / 86  (left arm) Cuff size:   regular  Vitals Entered By: Romualdo Bolk, CMA (August 18, 2007 9:25 AM)                 Chief Complaint:  Follow up.  History of Present Illness: follow up chronic medical probles of HTN and LIPIDS adn CAD  Hypertension History:      He denies headache, chest pain, palpitations, dyspnea with exertion, orthopnea, PND, peripheral edema, visual symptoms, neurologic problems, syncope, and side effects from treatment.  He notes no problems with any antihypertensive medication side effects.  Further comments include: stable.        Positive major cardiovascular risk factors include male age 23 years old or older and hypertension.  Negative major cardiovascular risk factors include non-tobacco-user status.        Positive history for target organ damage include ASHD (either angina; prior MI; prior CABG).     Current Allergies (reviewed today): No known allergies   Past Medical History:    Reviewed history from 03/27/2007 and no changes required:       Allergic rhinitis       Skin cancer, hx of       Low back pain  Past Surgical History:    Reviewed history from 03/27/2007 and no changes required:       Appendectomy       Cholecystectomy       Vasectomy   Family History:    Reviewed history and no changes required:       father died from sideroblastic anemia       mother died from dementia       sister with anurysm  Social History:    Reviewed history and no changes required:       Married       Former Smoker       Alcohol use-no       Drug use-no       Regular exercise-yes   Risk Factors:  Tobacco use:  quit Drug use:  no Alcohol use:  no Exercise:  yes   Review of Systems  The patient denies anorexia, fever, weight loss, vision  loss, decreased hearing, hoarseness, chest pain, syncope, dyspnea on exhertion, peripheral edema, prolonged cough, hemoptysis, abdominal pain, melena, hematochezia, severe indigestion/heartburn, hematuria, incontinence, genital sores, muscle weakness, suspicious skin lesions, transient blindness, difficulty walking, depression, unusual weight change, abnormal bleeding, enlarged lymph nodes, angioedema, breast masses, and testicular masses.     Physical Exam  General:     Well-developed,well-nourished,in no acute distress; alert,appropriate and cooperative throughout examination Head:     Normocephalic and atraumatic without obvious abnormalities. No apparent alopecia or balding. Ears:     External ear exam shows no significant lesions or deformities.  Otoscopic examination reveals clear canals, tympanic membranes are intact bilaterally without bulging, retraction, inflammation or discharge. Hearing is grossly normal bilaterally. Mouth:     Oral mucosa and oropharynx without lesions or exudates.  Teeth in good repair. Neck:     No deformities, masses, or tenderness noted. Lungs:     Normal respiratory effort, chest expands symmetrically. Lungs are clear to auscultation, no crackles or wheezes. Heart:  normal rate and regular rhythm.   Abdomen:     Bowel sounds positive,abdomen soft and non-tender without masses, organomegaly or hernias noted. Msk:     had hand surgery Pulses:     R and L carotid,radial,femoral,dorsalis pedis and posterior tibial pulses are full and equal bilaterally Extremities:     No clubbing, cyanosis, edema, or deformity noted with normal full range of motion of all joints.   Neurologic:     alert & oriented X3.      Impression & Recommendations:  Problem # 1:  HYPERTENSION (ICD-401.9) Assessment: Unchanged  His updated medication list for this problem includes:    Taztia Xt 300 Mg Cp24 (Diltiazem hcl er beads) ..... Once daily    Micardis Hct 80-25 Mg  Tabs (Telmisartan-hctz) ..... Once daily  Orders: TLB-BMP (Basic Metabolic Panel-BMET) (80048-METABOL)  BP today: 140/86  10 Yr Risk Heart Disease: Not enough information   Problem # 2:  CAD (ICD-414.00) Assessment: Improved  His updated medication list for this problem includes:    Taztia Xt 300 Mg Cp24 (Diltiazem hcl er beads) ..... Once daily    Micardis Hct 80-25 Mg Tabs (Telmisartan-hctz) ..... Once daily  Orders: TLB-Lipid Panel (80061-LIPID)   Problem # 3:  NEOPLASM, MALIGNANT, SKIN, TRUNK (ICD-173.5) skin exam no evident recurrence  Complete Medication List: 1)  Astelin 137 Mcg/spray Soln (Azelastine hcl) .... Two times a day 2)  Taztia Xt 300 Mg Cp24 (Diltiazem hcl er beads) .... Once daily 3)  Triamcinolone Acetonide 0.5 % Crea (Triamcinolone acetonide) .... As directed 4)  Micardis Hct 80-25 Mg Tabs (Telmisartan-hctz) .... Once daily  Other Orders: TLB-Hepatic/Liver Function Pnl (80076-HEPATIC)  Hypertension Assessment/Plan:      The patient's hypertensive risk group is category C: Target organ damage and/or diabetes.  Today's blood pressure is 140/86.  His blood pressure goal is < 140/90.   Patient Instructions: 1)  Please schedule a follow-up appointment in 3 months.    Prescriptions: MICARDIS HCT 80-25 MG  TABS (TELMISARTAN-HCTZ) once daily  #30 x 11   Entered and Authorized by:   Stacie Glaze MD   Signed by:   Stacie Glaze MD on 08/18/2007   Method used:   Print then Give to Patient   RxID:   [id number] TRIAMCINOLONE ACETONIDE 0.5 % CREA (TRIAMCINOLONE ACETONIDE) as directed  #60 Gram x 6   Entered and Authorized by:   Stacie Glaze MD   Signed by:   Stacie Glaze MD on 08/18/2007   Method used:   Print then Give to Patient   RxID:   [id number] TAZTIA XT 300 MG CP24 (DILTIAZEM HCL ER BEADS) once daily  #30 Capsule x 11   Entered and Authorized by:   Stacie Glaze MD   Signed by:   Stacie Glaze MD on 08/18/2007   Method used:    Print then Give to Patient   RxID:   [id number] ASTELIN 137 MCG/SPRAY SOLN (AZELASTINE HCL) two times a day  #1 x 11   Entered and Authorized by:   Stacie Glaze MD   Signed by:   Stacie Glaze MD on 08/18/2007   Method used:   Print then Give to Patient   RxID:   [id number]  ]

## 2010-09-12 NOTE — Assessment & Plan Note (Signed)
Summary: 4 month follow up/.mhf   Vital Signs:  Patient profile:   77 year old male Height:      72 inches Weight:      216 pounds BMI:     29.40 Temp:     98.2 degrees F oral Pulse rate:   72 / minute Resp:     14 per minute BP sitting:   144 / 84  (left arm)  Vitals Entered By: Willy Eddy, LPN (May 23, 2009 8:13 AM)  CC:  rov labs and mole check.  History of Present Illness: presents for excision of a non healing ulcer on his left shoulder, he also has several crusting lesion on his face that appear to be AK's primary reason for this visit however was to go over the monitering labs for hyperlipidemia and medicatoion management    Hyperlipidemia Follow-Up      This is a 77 year old man who presents for Hyperlipidemia follow-up.  The patient denies muscle aches, GI upset, abdominal pain, flushing, itching, constipation, diarrhea, and fatigue.  Other symptoms include chest pain/pressure.  Compliance with medications (by patient report) has been near 100%.  Dietary compliance has been good.  The patient reports exercising occasionally.  Adjunctive measures currently used by the patient include fish oil supplements.    Problems Prior to Update: 1)  Foot Pain  (ICD-729.5) 2)  Hyperlipidemia, With High Hdl  (ICD-272.4) 3)  Carpal Tunnel Syndrome, Left  (ICD-354.0) 4)  Actinic Keratosis, Head  (ICD-702.0) 5)  Benign Prostatic Hypertrophy, With Urinary Obstruction  (ICD-600.01) 6)  Uns Advrs Eff Uns Rx Medicinal&biological Sbstnc  (ICD-995.20) 7)  Cad  (ICD-414.00) 8)  Neoplasm, Malignant, Skin, Trunk  (ICD-173.5) 9)  Hypertension  (ICD-401.9) 10)  Low Back Pain  (ICD-724.2) 11)  Skin Cancer, Hx of  (ICD-V10.83) 12)  Allergic Rhinitis  (ICD-477.9)  Medications Prior to Update: 1)  Astelin 137 Mcg/spray Soln (Azelastine Hcl) .... Two Times A Day 2)  Taztia Xt 300 Mg Cp24 (Diltiazem Hcl Er Beads) .... Once Daily 3)  Triamcinolone Acetonide 0.5 % Crea (Triamcinolone  Acetonide) .... As Directed 4)  Micardis Hct 80-25 Mg  Tabs (Telmisartan-Hctz) .... Once Daily 5)  Crestor 10 Mg Tabs (Rosuvastatin Calcium) .... One By Mouth  Monday and Friday ( Twice A Week)  Current Medications (verified): 1)  Astelin 137 Mcg/spray Soln (Azelastine Hcl) .... Two Times A Day 2)  Taztia Xt 300 Mg Cp24 (Diltiazem Hcl Er Beads) .... Once Daily 3)  Triamcinolone Acetonide 0.5 % Crea (Triamcinolone Acetonide) .... As Directed 4)  Micardis Hct 80-25 Mg  Tabs (Telmisartan-Hctz) .... Once Daily 5)  Crestor 10 Mg Tabs (Rosuvastatin Calcium) .... One By Mouth  Monday and Friday ( Twice A Week)  Allergies (verified): 1)  * Atuss  Past History:  Family History: Last updated: 09/03/2007 father died from sideroblastic anemia mother died from dementia sister with anurysm  Social History: Last updated: September 03, 2007 Married Former Smoker Alcohol use-no Drug use-no Regular exercise-yes  Risk Factors: Exercise: yes (2007-09-03)  Risk Factors: Smoking Status: quit (09-03-2007)  Past medical, surgical, family and social histories (including risk factors) reviewed, and no changes noted (except as noted below).  Past Medical History: Reviewed history from 04/13/2008 and no changes required. Allergic rhinitis Skin cancer, hx of Hypertension Low back pain carple tunnel  Past Surgical History: Reviewed history from 05/09/2007 and no changes required. knee arthroscopy Appendectomy Cholecystectomy Sinus surgery Vasectomy Lumbar laminectomy Lumbar fusion rectal fisure joint replacement hand  Family History: Reviewed history from 08/18/2007 and no changes required. father died from sideroblastic anemia mother died from dementia sister with anurysm  Social History: Reviewed history from 08/18/2007 and no changes required. Married Former Smoker Alcohol use-no Drug use-no Regular exercise-yes  Review of Systems  The patient denies anorexia, weight loss,  weight gain, vision loss, decreased hearing, hoarseness, chest pain, syncope, dyspnea on exertion, peripheral edema, prolonged cough, headaches, hemoptysis, abdominal pain, melena, hematochezia, severe indigestion/heartburn, hematuria, incontinence, genital sores, muscle weakness, suspicious skin lesions, transient blindness, difficulty walking, depression, unusual weight change, abnormal bleeding, enlarged lymph nodes, angioedema, and breast masses.         Flu Vaccine Consent Questions     Do you have a history of severe allergic reactions to this vaccine? no    Any prior history of allergic reactions to egg and/or gelatin? no    Do you have a sensitivity to the preservative Thimersol? no    Do you have a past history of Guillan-Barre Syndrome? no    Do you currently have an acute febrile illness? no    Have you ever had a severe reaction to latex? no    Vaccine information given and explained to patient? yes    Are you currently pregnant? no    Lot Number:[id number]   Exp Date:02/09/2010   Site Given  Left Deltoid IM   Physical Exam  General:  Well-developed,well-nourished,in no acute distress; alert,appropriate and cooperative throughout examination Eyes:  pupils equal, pupils round, and pupils reactive to light.   Ears:  R ear normal and L ear normal.   Lungs:  normal respiratory effort and no wheezes.   Heart:  normal rate and regular rhythm.   Abdomen:  soft and non-tender.   Skin:  ulcerative leson on the l,eft shoulder that apears to be clinically a basal cell cancer  multiple ak's on face Cervical Nodes:  No lymphadenopathy noted Axillary Nodes:  No palpable lymphadenopathy   Impression & Recommendations:  Problem # 1:  HYPERLIPIDEMIA, WITH HIGH HDL (ICD-272.4)  acceptable number sor hx of CAD with high HDL and ldl near goal, need to loose weigth to limit triglycerides His updated medication list for this problem includes:    Crestor 10 Mg Tabs (Rosuvastatin calcium)  ..... One by mouth  monday and friday ( twice a week)  Labs Reviewed: SGOT: 28 (05/16/2009)   SGPT: 36 (05/16/2009)  Prior 10 Yr Risk Heart Disease: N/A (08/18/2007)   HDL:46.20 (05/16/2009), 56.80 (01/12/2009)  LDL:111 (05/16/2009), DEL (10/11/2008)  Chol:168 (05/16/2009), 227 (01/12/2009)  Trig:56.0 (05/16/2009), 93.0 (01/12/2009)  Problem # 2:  NEOPLASM, MALIGNANT, SKIN, TRUNK (ICD-173.5) Assessment: Deteriorated a 2 cm elipitical incision was made. pt was preped in a sterile manor and informed consent obtained. Using a 15 blade the lesion on the left shoulder was removed and sent for pathology. sterile dressings were applied   sight was closed with 54 3-0 sutures and wound care discussed with the pt.  Orders: Excise Malig lesion (TAL) 0.6 - 1.0 cm (11601) Surgical Suture Tray (Z6109)  Problem # 3:  ACTINIC KERATOSIS, HEAD (ICD-702.0) the lesion( s) was identifies as a     AK     and 40 seconds of cryotherapy with the liguid nitrogen gun was apllied to the site. The pt tolerated the procedure and post procedure care was discussed  Orders: Cryotherapy/Destruction benign or premalignant lesion (1st lesion)  (17000)  Complete Medication List: 1)  Astelin 137 Mcg/spray Soln (Azelastine hcl) .... Two times  a day 2)  Taztia Xt 300 Mg Cp24 (Diltiazem hcl er beads) .... Once daily 3)  Triamcinolone Acetonide 0.5 % Crea (Triamcinolone acetonide) .... As directed 4)  Micardis Hct 80-25 Mg Tabs (Telmisartan-hctz) .... Once daily 5)  Crestor 10 Mg Tabs (Rosuvastatin calcium) .... One by mouth  monday and friday ( twice a week)  Other Orders: Flu Vaccine 53yrs + (74259) Administration Flu vaccine - MCR (D6387)  Patient Instructions: 1)  suture removal in one week at noon 2)  3 month return visit

## 2010-09-12 NOTE — Assessment & Plan Note (Signed)
Summary: 3 WK ROV/NJR   Vital Signs:  Patient Profile:   77 Years Old Male Height:     72 inches Weight:      207 pounds Temp:     98.5 degrees F oral Pulse rate:   76 / minute Resp:     14 per minute BP sitting:   140 / 80  (left arm)  Vitals Entered By: Willy Eddy, LPN (April 13, 2008 9:11 AM)                 Chief Complaint:  roa-cryo  therapy.  History of Present Illness: recheck of cryotherapy and chronic medical problems HTN right carple tunnel multiple cyotherapy    Follow-Up Visit      This is a 77 year old man who presents for Follow-up visit.  The patient denies chest pain, palpitations, dizziness, syncope, low blood sugar symptoms, high blood sugar symptoms, edema, SOB, DOE, PND, and orthopnea.  Since the last visit the patient notes problems with medications.  The patient reports taking meds as prescribed, monitoring BP, and dietary compliance.  When questioned about possible medication side effects, the patient notes cramping and muscle aches.    Hypertension History:      He denies headache, chest pain, palpitations, dyspnea with exertion, orthopnea, PND, peripheral edema, visual symptoms, neurologic problems, syncope, and side effects from treatment.        Positive major cardiovascular risk factors include male age 59 years old or older and hypertension.  Negative major cardiovascular risk factors include non-tobacco-user status.        Positive history for target organ damage include ASHD (either angina; prior MI; prior CABG).       Current Allergies: No known allergies   Past Medical History:    Reviewed history from 05/09/2007 and no changes required:       Allergic rhinitis       Skin cancer, hx of       Hypertension       Low back pain       carple tunnel  Past Surgical History:    Reviewed history from 05/09/2007 and no changes required:       knee arthroscopy       Appendectomy       Cholecystectomy       Sinus surgery  Vasectomy       Lumbar laminectomy       Lumbar fusion       rectal fisure       joint replacement hand   Family History:    Reviewed history from 08/18/2007 and no changes required:       father died from sideroblastic anemia       mother died from dementia       sister with anurysm  Social History:    Reviewed history from 08/18/2007 and no changes required:       Married       Former Smoker       Alcohol use-no       Drug use-no       Regular exercise-yes   Risk Factors: Tobacco use:  quit Drug use:  no Alcohol use:  no Exercise:  yes   Review of Systems  The patient denies anorexia, fever, weight loss, weight gain, vision loss, decreased hearing, hoarseness, chest pain, syncope, dyspnea on exertion, peripheral edema, prolonged cough, headaches, hemoptysis, abdominal pain, melena, hematochezia, severe indigestion/heartburn, hematuria, incontinence, genital sores, muscle weakness, suspicious  skin lesions, transient blindness, difficulty walking, depression, unusual weight change, abnormal bleeding, enlarged lymph nodes, angioedema, breast masses, and testicular masses.     Physical Exam  General:     Well-developed,well-nourished,in no acute distress; alert,appropriate and cooperative throughout examination Eyes:     No corneal or conjunctival inflammation noted. EOMI. Perrla. Funduscopic exam benign, without hemorrhages, exudates or papilledema. Vision grossly normal. Ears:     External ear exam shows no significant lesions or deformities.  Otoscopic examination reveals clear canals, tympanic membranes are intact bilaterally without bulging, retraction, inflammation or discharge. Hearing is grossly normal bilaterally. Nose:     External nasal examination shows no deformity or inflammation. Nasal mucosa are pink and moist without lesions or exudates. Mouth:     Oral mucosa and oropharynx without lesions or exudates.  Teeth in good repair. Neck:     No deformities,  masses, or tenderness noted. Lungs:     Normal respiratory effort, chest expands symmetrically. Lungs are clear to auscultation, no crackles or wheezes. Heart:     normal rate and regular rhythm.   Abdomen:     Bowel sounds positive,abdomen soft and non-tender without masses, organomegaly or hernias noted. Skin:     actinic keratosis, seborrheic keratosis, dysplastic nevi, and suspicious lesion(s).   Cervical Nodes:     No lymphadenopathy noted Axillary Nodes:     No palpable lymphadenopathy    Impression & Recommendations:  Problem # 1:  CARPAL TUNNEL SYNDROME, LEFT (ICD-354.0) increased numnbness in hands with hx of carple tnnes and classic symptoms Orders: Joint Aspirate / Injection, Large (20610) Depo- Medrol 40mg  (J1030)   Problem # 2:  ACTINIC KERATOSIS, HEAD (ICD-702.0) 11 totol lesion of face and ears Orders: Cryotherapy/Destruction benign or premalignant lesion (1st lesion)  (17000) Cryotherapy/Destruction benign or premalignant lesion (2nd-14th lesions) (17003)   Problem # 3:  HYPERTENSION (ICD-401.9)  His updated medication list for this problem includes:    Taztia Xt 300 Mg Cp24 (Diltiazem hcl er beads) ..... Once daily    Micardis Hct 80-25 Mg Tabs (Telmisartan-hctz) ..... Once daily  BP today: 140/80 Prior BP: 136/90 (01/16/2008)  Prior 10 Yr Risk Heart Disease: N/A (08/18/2007)  Labs Reviewed: Creat: 1.0 (01/16/2008) Chol: 218 (01/16/2008)   HDL: 62.0 (01/16/2008)   LDL: DEL (01/16/2008)   TG: 84 (01/16/2008)   Complete Medication List: 1)  Astelin 137 Mcg/spray Soln (Azelastine hcl) .... Two times a day 2)  Taztia Xt 300 Mg Cp24 (Diltiazem hcl er beads) .... Once daily 3)  Triamcinolone Acetonide 0.5 % Crea (Triamcinolone acetonide) .... As directed 4)  Micardis Hct 80-25 Mg Tabs (Telmisartan-hctz) .... Once daily 5)  Vicodin Hp 10-660 Mg Tabs (Hydrocodone-acetaminophen) .Marland Kitchen.. 1 every 6-8 hours as needed pain  Hypertension Assessment/Plan:       The patient's hypertensive risk group is category C: Target organ damage and/or diabetes.  Today's blood pressure is 140/80.  His blood pressure goal is < 140/90.   Patient Instructions: 1)  Please schedule a follow-up appointment in 3 months.   ]

## 2010-09-12 NOTE — Progress Notes (Signed)
Summary: gout needs rx called in  sch in the am   Phone Note Call from Patient Call back at [phone]   Caller: pt vm triage Call For: jenkins Summary of Call: has gout in his right foot needs an rx called in  Initial call taken by: Roselle Locus,  March 17, 2009 1:38 PM  Follow-up for Phone Call        Gout is not on pt problem list, no medication on pt med list for gout at this time.  Please scheduled OV for pt if he is willing.  Thanks Follow-up by: Sid Falcon LPN,  March 17, 2009 2:36 PM  Additional Follow-up for Phone Call Additional follow up Details #1::        coming in the am to see Dr Haskell Riling  March 17, 2009 3:00 PM   Additional Follow-up by: Sid Falcon LPN,  March 17, 2009 3:04 PM

## 2010-09-12 NOTE — Progress Notes (Signed)
Summary: Pt says that [facility] hasnt rcvd letter  Phone Note Call from Patient Call back at [facility] Phone [phone] Call back at [phone] cell   Caller: Patient Complaint: Breathing Problems Summary of Call: Pt called and said that [facility] - Dr. Shelle Iron, hasnt rcvd medical clearance letter yet. Pls send right away.   Initial call taken by: Lucy Antigua,  October 18, 2009 8:37 AM  Follow-up for Phone Call        pt informed will refax Follow-up by: Willy Eddy, LPN,  October 18, 2009 8:48 AM

## 2010-09-12 NOTE — Letter (Signed)
Summary: Dr. Teressa Senter note  Dr. Teressa Senter note   Imported By: Kassie Mends 05/20/2007 09:24:58  _____________________________________________________________________  External Attachment:    Type:   Image     Comment:   Dr. Teressa Senter note

## 2010-09-12 NOTE — Assessment & Plan Note (Signed)
Summary: 3 MONTH ROV/NJR   Vital Signs:  Patient profile:   77 year old male Height:      72 inches Weight:      214 pounds BMI:     29.13 Temp:     98.2 degrees F oral Pulse rate:   72 / minute Resp:     14 per minute BP sitting:   144 / 82  (left arm)  Vitals Entered By: Willy Eddy, LPN (August 22, 2009 9:27 AM) CC: roa, Hypertension Management, Lipid Management   CC:  roa, Hypertension Management, and Lipid Management.  History of Present Illness: follow up of change in medications on pulse crestor friday and monday  Hypertension History:      He denies headache, chest pain, palpitations, dyspnea with exertion, orthopnea, PND, peripheral edema, visual symptoms, neurologic problems, syncope, and side effects from treatment.        Positive major cardiovascular risk factors include male age 56 years old or older, hyperlipidemia, and hypertension.  Negative major cardiovascular risk factors include non-tobacco-user status.        Positive history for target organ damage include ASHD (either angina/prior MI/prior CABG).    Lipid Management History:      Positive NCEP/ATP III risk factors include male age 80 years old or older, hypertension, and ASHD (either angina/prior MI/prior CABG).  Negative NCEP/ATP III risk factors include non-tobacco-user status.      Preventive Screening-Counseling & Management  Alcohol-Tobacco     Smoking Status: quit  Problems Prior to Update: 1)  Foot Pain  (ICD-729.5) 2)  Hyperlipidemia, With High Hdl  (ICD-272.4) 3)  Carpal Tunnel Syndrome, Left  (ICD-354.0) 4)  Actinic Keratosis, Head  (ICD-702.0) 5)  Benign Prostatic Hypertrophy, With Urinary Obstruction  (ICD-600.01) 6)  Uns Advrs Eff Uns Rx Medicinal&biological Sbstnc  (ICD-995.20) 7)  Cad  (ICD-414.00) 8)  Neoplasm, Malignant, Skin, Trunk  (ICD-173.5) 9)  Hypertension  (ICD-401.9) 10)  Low Back Pain  (ICD-724.2) 11)  Skin Cancer, Hx of  (ICD-V10.83) 12)  Allergic Rhinitis   (ICD-477.9)  Medications Prior to Update: 1)  Astelin 137 Mcg/spray Soln (Azelastine Hcl) .... Two Times A Day 2)  Taztia Xt 300 Mg Cp24 (Diltiazem Hcl Er Beads) .... Once Daily 3)  Triamcinolone Acetonide 0.5 % Crea (Triamcinolone Acetonide) .... As Directed 4)  Micardis Hct 80-25 Mg  Tabs (Telmisartan-Hctz) .... Once Daily 5)  Crestor 10 Mg Tabs (Rosuvastatin Calcium) .... One By Mouth  Monday and Friday ( Twice A Week) 6)  Prednisone 10 Mg Tabs (Prednisone) .... Taper As Follows 5tabs,5tabs,4 Tabs,4 Tabs,3,3,2,1  Current Medications (verified): 1)  Astelin 137 Mcg/spray Soln (Azelastine Hcl) .... Two Times A Day 2)  Taztia Xt 300 Mg Cp24 (Diltiazem Hcl Er Beads) .... Once Daily 3)  Triamcinolone Acetonide 0.5 % Crea (Triamcinolone Acetonide) .... As Directed 4)  Micardis Hct 80-25 Mg  Tabs (Telmisartan-Hctz) .... Once Daily 5)  Crestor 10 Mg Tabs (Rosuvastatin Calcium) .... One By Mouth  Monday and Friday ( Twice A Week) 6)  Prednisone 10 Mg Tabs (Prednisone) .... Taper As Follows 5tabs,5tabs,4 Tabs,4 Tabs,3,3,2,1  Allergies (verified): 1)  * Atuss  Past History:  Family History: Last updated: 2007-09-12 father died from sideroblastic anemia mother died from dementia sister with anurysm  Social History: Last updated: 09-12-07 Married Former Smoker Alcohol use-no Drug use-no Regular exercise-yes  Risk Factors: Exercise: yes (12-Sep-2007)  Risk Factors: Smoking Status: quit (08/22/2009)  Past medical, surgical, family and social histories (including risk factors)  reviewed, and no changes noted (except as noted below).  Past Medical History: Reviewed history from 04/13/2008 and no changes required. Allergic rhinitis Skin cancer, hx of Hypertension Low back pain carple tunnel  Past Surgical History: Reviewed history from 05/09/2007 and no changes required. knee arthroscopy Appendectomy Cholecystectomy Sinus surgery Vasectomy Lumbar laminectomy Lumbar  fusion rectal fisure joint replacement hand  Family History: Reviewed history from 08/18/2007 and no changes required. father died from sideroblastic anemia mother died from dementia sister with anurysm  Social History: Reviewed history from 08/18/2007 and no changes required. Married Former Smoker Alcohol use-no Drug use-no Regular exercise-yes  Review of Systems  The patient denies anorexia, fever, weight loss, weight gain, vision loss, decreased hearing, hoarseness, chest pain, syncope, dyspnea on exertion, peripheral edema, prolonged cough, headaches, hemoptysis, abdominal pain, melena, hematochezia, severe indigestion/heartburn, hematuria, incontinence, genital sores, muscle weakness, suspicious skin lesions, transient blindness, difficulty walking, depression, unusual weight change, abnormal bleeding, enlarged lymph nodes, angioedema, breast masses, and testicular masses.    Physical Exam  General:  Well-developed,well-nourished,in no acute distress; alert,appropriate and cooperative throughout examination Eyes:  pupils equal, pupils round, and pupils reactive to light.   Ears:  R ear normal and L ear normal.   Lungs:  normal respiratory effort and no wheezes.   Heart:  normal rate and regular rhythm.   Abdomen:  soft and non-tender.   Msk:  normal ROM and no joint swelling.   Extremities:  right foot reveals some erythema and mild swelling and tenderness and slight work mostly involving the second and third digit MTP joint. There is some redness extending to the mid forefoot. No obvious breaks in the skin. No ankle tenderness or swelling. No leg edema. Neurologic:  alert & oriented X3 and cranial nerves II-XII intact.     Impression & Recommendations:  Problem # 1:  HYPERLIPIDEMIA, WITH HIGH HDL (ICD-272.4) Assessment Comment Only pusle therapy is working well, pt is near goals with less side effect His updated medication list for this problem includes:    Crestor 10  Mg Tabs (Rosuvastatin calcium) ..... One by mouth  monday and friday ( twice a week)  Labs Reviewed: SGOT: 28 (05/16/2009)   SGPT: 36 (05/16/2009)  Prior 10 Yr Risk Heart Disease: N/A (08/18/2007)   HDL:46.20 (05/16/2009), 56.80 (01/12/2009)  LDL:111 (05/16/2009), DEL (10/11/2008)  Chol:168 (05/16/2009), 227 (01/12/2009)  Trig:56.0 (05/16/2009), 93.0 (01/12/2009)  Problem # 2:  HYPERTENSION (ICD-401.9) Assessment: Unchanged satble with no chaest pain His updated medication list for this problem includes:    Taztia Xt 300 Mg Cp24 (Diltiazem hcl er beads) ..... Once daily    Micardis Hct 80-25 Mg Tabs (Telmisartan-hctz) ..... Once daily  BP today: 144/82 Prior BP: 144/84 (05/23/2009)  Prior 10 Yr Risk Heart Disease: N/A (08/18/2007)  Labs Reviewed: K+: 4.0 (01/16/2008) Creat: : 1.0 (01/16/2008)   Chol: 168 (05/16/2009)   HDL: 46.20 (05/16/2009)   LDL: 111 (05/16/2009)   TG: 56.0 (05/16/2009)  Problem # 3:  FOOT PAIN (ICD-729.5) stable  Complete Medication List: 1)  Astelin 137 Mcg/spray Soln (Azelastine hcl) .... Two times a day 2)  Taztia Xt 300 Mg Cp24 (Diltiazem hcl er beads) .... Once daily 3)  Triamcinolone Acetonide 0.5 % Crea (Triamcinolone acetonide) .... As directed 4)  Micardis Hct 80-25 Mg Tabs (Telmisartan-hctz) .... Once daily 5)  Crestor 10 Mg Tabs (Rosuvastatin calcium) .... One by mouth  monday and friday ( twice a week) 6)  Prednisone 10 Mg Tabs (Prednisone) .... Taper as follows 5tabs,5tabs,4  tabs,4 tabs,3,3,2,1  Hypertension Assessment/Plan:      The patient's hypertensive risk group is category C: Target organ damage and/or diabetes.  Today's blood pressure is 144/82.  His blood pressure goal is < 140/90.  Lipid Assessment/Plan:      Based on NCEP/ATP III, the patient's risk factor category is "history of coronary disease, peripheral vascular disease, cerebrovascular disease, or aortic aneurysm".  The patient's lipid goals are as follows: Total cholesterol  goal is 200; LDL cholesterol goal is 100; HDL cholesterol goal is 40; Triglyceride goal is 150.  His LDL cholesterol goal has not been met.  Secondary causes for hyperlipidemia have been ruled out.  He has been counseled on adjunctive measures for lowering his cholesterol and has been provided with dietary instructions.    Patient Instructions: 1)  Please schedule a follow-up appointment in 3 months.

## 2010-09-12 NOTE — Progress Notes (Signed)
Summary: tight chest sore throat  Phone Note Call from Patient Call back at c [phone]   Call For: Lesta Limbert Summary of Call: Tight chest & sore throat x 3days.  Getting worse.  No fever but feels that's next.  Congested.  Not coming up.  Using Rob CS - no improvement.  [facility]  NK[facility]. Initial call taken by: Rudy Jew, RN,  August 29, 2007 12:36 PM  Follow-up for Phone Call        zpack and hycodan 6oz 2 tsp by mouth q 4-6 Follow-up by: Stacie Glaze MD,  August 29, 2007 2:01 PM  Additional Follow-up for Phone Call Additional follow up Details #1::        [facility] and patient aware. Additional Follow-up by: Rudy Jew, RN,  August 29, 2007 2:51 PM    New/Updated Medications: ZITHROMAX Z-PAK 250 MG  TABS (AZITHROMYCIN)  HYCODAN 5-1.5 MG/5ML  SYRP (HYDROCODONE-HOMATROPINE) 2tsp by mouth q 4-6hrs   Prescriptions: HYCODAN 5-1.5 MG/5ML  SYRP (HYDROCODONE-HOMATROPINE) 2tsp by mouth q 4-6hrs  #6oz x 0   Entered by:   Rudy Jew, RN   Authorized by:   Stacie Glaze MD   Signed by:   Rudy Jew, RN on 08/29/2007   Method used:   Telephoned to ...         RxID:   [id number]

## 2010-09-12 NOTE — Assessment & Plan Note (Signed)
Summary: flu shot/jls  Nurse Visit    Prior Medications: ASTELIN 137 MCG/SPRAY SOLN (AZELASTINE HCL) two times a day TAZTIA XT 300 MG CP24 (DILTIAZEM HCL ER BEADS) once daily TRIAMCINOLONE ACETONIDE 0.5 % CREA (TRIAMCINOLONE ACETONIDE) as directed MICARDIS HCT 80-25 MG  TABS (TELMISARTAN-HCTZ) once daily VICODIN HP 10-660 MG  TABS (HYDROCODONE-ACETAMINOPHEN) 1 every 6-8 hours as needed pain Current Allergies: No known allergies   Flu Vaccine Consent Questions     Do you have a history of severe allergic reactions to this vaccine? no    Any prior history of allergic reactions to egg and/or gelatin? no    Do you have a sensitivity to the preservative Thimersol? no    Do you have a past history of Guillan-Barre Syndrome? no    Do you currently have an acute febrile illness? no    Have you ever had a severe reaction to latex? no    Vaccine information given and explained to patient? yes    Are you currently pregnant? no    Lot Number:[id number]   Site Given  Left Deltoid IM   Orders Added: 1)  Flu Vaccine 20yrs + [16109] 2)  Administration Flu vaccine [G0008]    ]

## 2010-09-12 NOTE — Assessment & Plan Note (Signed)
Summary: gout?/mhf   Vital Signs:  Patient profile:   77 year old male Weight:      216 pounds Temp:     98.2 degrees F oral Pulse rate:   72 / minute Pulse rhythm:   regular Resp:     12 per minute BP sitting:   150 / 90  (left arm) Cuff size:   regular  Vitals Entered By: Sid Falcon LPN (March 18, 2009 8:55 AM) CC: Hx gout Rt Gt toe 08', pain returned 1 week ago Rt Foot   History of Present Illness: Patient seen as a workin with painful right foot. He relates diagnosis of gout by podiatrist about a year and a half ago with very similar flareup. He started having some soreness and pain about a week and a half ago and has had a few days now some redness and swelling mostly second third MTP joints. No injury. No fever. Takes Micardis/ HCTZ for hypertension. Drinks about 2-3 beers per day. Frequent red meat consumption.  Allergies: 1)  * Atuss  Past History:  Past Medical History: Last updated: 04/13/2008 Allergic rhinitis Skin cancer, hx of Hypertension Low back pain carple tunnel  Review of Systems  The patient denies fever.         patient has some pain with ambulation.  Physical Exam  General:  Well-developed,well-nourished,in no acute distress; alert,appropriate and cooperative throughout examination Extremities:  right foot reveals some erythema and mild swelling and tenderness and slight work mostly involving the second and third digit MTP joint. There is some redness extending to the mid forefoot. No obvious breaks in the skin. No ankle tenderness or swelling. No leg edema.   Impression & Recommendations:  Problem # 1:  FOOT PAIN (ICD-729.5) suspect recurrent gout though no history of definitive diagnosis.  very similar episode a year and a half ago which responded well to prednisone. Do not think this represents cellulitis but if he has any worsening and is not seeing immediate improvement with prednisone be in touch right away.  Complete Medication List:  1)  Astelin 137 Mcg/spray Soln (Azelastine hcl) .... Two times a day 2)  Taztia Xt 300 Mg Cp24 (Diltiazem hcl er beads) .... Once daily 3)  Triamcinolone Acetonide 0.5 % Crea (Triamcinolone acetonide) .... As directed 4)  Micardis Hct 80-25 Mg Tabs (Telmisartan-hctz) .... Once daily 5)  Crestor 10 Mg Tabs (Rosuvastatin calcium) .... One by mouth  monday and friday ( twice a week) 6)  Prednisone 10 Mg Tabs (Prednisone) .... Taper as follows:  5-5-4-4-3-3-2-1  Patient Instructions: 1)  Up Immediately If you Have Any Fever or Worsening Redness or Swelling. Prescriptions: PREDNISONE 10 MG TABS (PREDNISONE) taper as follows:  5-5-4-4-3-3-2-1  #27 x 0   Entered and Authorized by:   Evelena Peat MD   Signed by:   Evelena Peat MD on 03/18/2009   Method used:   [id number]       [address]       [place], [place]  [address]       Ph: [phone] or [phone]       Fax: [phone]   RxID:   [id number]

## 2010-09-12 NOTE — Letter (Signed)
Summary: Dr. Teressa Senter note  Dr. Teressa Senter note   Imported By: Kassie Mends 05/30/2007 10:37:23  _____________________________________________________________________  External Attachment:    Type:   Image     Comment:   Dr. Teressa Senter note

## 2010-09-12 NOTE — Letter (Signed)
Summary: Dr. Teressa Senter Office note  Dr. Teressa Senter Office note   Imported By: Kassie Mends 04/08/2007 09:15:18  _____________________________________________________________________  External Attachment:    Type:   Image     Comment:   Dr. Teressa Senter office note

## 2010-09-12 NOTE — Assessment & Plan Note (Signed)
Summary: mole and bp check \\\\dbb rsc per pt/njr  Medications Added ASTELIN 137 MCG/SPRAY SOLN (AZELASTINE HCL) two times a day TAZTIA XT 300 MG CP24 (DILTIAZEM HCL ER BEADS) once daily TRIAMCINOLONE ACETONIDE 0.5 % CREA (TRIAMCINOLONE ACETONIDE) as directed MICARDIS HCT 80-25 MG  TABS (TELMISARTAN-HCTZ) once daily      Allergies Added: NKDA  Vital Signs:  Patient Profile:   77 Years Old Male Height:     72 inches Weight:      210 pounds Temp:     98.4 degrees F rectal Pulse rate:   80 / minute Resp:     14 per minute BP sitting:   140 / 80  Vitals Entered By: Willy Eddy, LPN (May 09, 2007 10:11 AM)                 Chief Complaint:  6 month mole check/s/p knuckle replacement.  History of Present Illness: follow up of medications  Hypertension History:      He denies headache, chest pain, palpitations, dyspnea with exertion, orthopnea, PND, peripheral edema, visual symptoms, neurologic problems, syncope, and side effects from treatment.        Positive major cardiovascular risk factors include male age 50 years old or older and hypertension.  Negative major cardiovascular risk factors include non-tobacco-user status.        Positive history for target organ damage include ASHD (either angina; prior MI; prior CABG).     Current Allergies (reviewed today): No known allergies  Updated/Current Medications (including changes made in today's visit):  ASTELIN 137 MCG/SPRAY SOLN (AZELASTINE HCL) two times a day TAZTIA XT 300 MG CP24 (DILTIAZEM HCL ER BEADS) once daily TRIAMCINOLONE ACETONIDE 0.5 % CREA (TRIAMCINOLONE ACETONIDE) as directed MICARDIS HCT 80-25 MG  TABS (TELMISARTAN-HCTZ) once daily   Past Medical History:    Reviewed history and no changes required:       Allergic rhinitis       Skin cancer, hx of       Hypertension       Low back pain  Past Surgical History:    Reviewed history and no changes required:       knee arthroscopy  Appendectomy       Cholecystectomy       Sinus surgery       Vasectomy       Lumbar laminectomy       Lumbar fusion       rectal fisure       joint replacement hand   Family History:    Reviewed history and no changes required:       father  siderblastic anemia       mother age  demetia, hip fracture  Social History:    Reviewed history and no changes required:       Retired       Married       Former Smoker       Alcohol use-yes       Drug use-no       Regular exercise-yes   Risk Factors:  Tobacco use:  quit Drug use:  no Alcohol use:  yes Exercise:  yes   Review of Systems  The patient denies anorexia, fever, weight loss, vision loss, decreased hearing, hoarseness, chest pain, syncope, dyspnea on exhertion, peripheral edema, prolonged cough, hemoptysis, abdominal pain, melena, hematochezia, severe indigestion/heartburn, hematuria, incontinence, genital sores, muscle weakness, suspicious skin lesions, transient blindness, difficulty walking, depression, unusual weight change, abnormal bleeding,  enlarged lymph nodes, angioedema, breast masses, and testicular masses.     Physical Exam  General:     Well-developed,well-nourished,in no acute distress; alert,appropriate and cooperative throughout examination Head:     male-pattern balding.   Eyes:     No corneal or conjunctival inflammation noted. EOMI. Perrla. Funduscopic exam benign, without hemorrhages, exudates or papilledema. Vision grossly normal. Nose:     External nasal examination shows no deformity or inflammation. Nasal mucosa are pink and moist without lesions or exudates. Mouth:     Oral mucosa and oropharynx without lesions or exudates.  Teeth in good repair. Lungs:     Normal respiratory effort, chest expands symmetrically. Lungs are clear to auscultation, no crackles or wheezes. Heart:     Normal rate and regular rhythm. S1 and S2 normal without gallop, murmur, click, rub or other extra sounds. Skin:      aktinic keratosis    Impression & Recommendations:  Problem # 1:  HYPERTENSION (ICD-401.9)  His updated medication list for this problem includes:    Taztia Xt 300 Mg Cp24 (Diltiazem hcl er beads) ..... Once daily    Micardis Hct 80-25 Mg Tabs (Telmisartan-hctz) ..... Once daily  BP today: 140/80   Problem # 2:  SKIN CANCER, HX OF (ICD-V10.83)  Orders: Excise lesion (TAL) 0.6 - 1.0 (11401) Excise lesion (TAL) 0.6 - 1.0 (11401) Excise lesion (TAL) 0.6 - 1.0 (11401)   Problem # 3:  LOW BACK PAIN (ICD-724.2) Discussed use of moist heat or ice, modified activities, medications, and stretching/strengthening exercises. Back care instructions given. To be seen in 2 weeks if no improvement; sooner if worsening of symptoms.   Complete Medication List: 1)  Astelin 137 Mcg/spray Soln (Azelastine hcl) .... Two times a day 2)  Taztia Xt 300 Mg Cp24 (Diltiazem hcl er beads) .... Once daily 3)  Triamcinolone Acetonide 0.5 % Crea (Triamcinolone acetonide) .... As directed 4)  Micardis Hct 80-25 Mg Tabs (Telmisartan-hctz) .... Once daily  Hypertension Assessment/Plan:      The patient's hypertensive risk group is category C: Target organ damage and/or diabetes.  Today's blood pressure is 140/80.  His blood pressure goal is < 140/90.   Patient Instructions: 1)  Please schedule a follow-up appointment in 4 months. 2)  Hepatic Panel prior to visit, ICD-9: 995.20 3)  Lipid Panel prior to visit, ICD-9:272.4 4)  PSA prior to visit, ICD-9:601.0    Prescriptions: MICARDIS HCT 80-25 MG  TABS (TELMISARTAN-HCTZ) once daily  #30 x 11   Entered by:   Willy Eddy, LPN   Authorized by:   Stacie Glaze MD   Signed by:   Willy Eddy, LPN on 91/47/8295   Method used:   Print then Give to Patient   RxID:   [id number] TRIAMCINOLONE ACETONIDE 0.5 % CREA (TRIAMCINOLONE ACETONIDE) as directed  #1 x 6   Entered by:   Willy Eddy, LPN   Authorized by:   Stacie Glaze MD    Signed by:   Willy Eddy, LPN on 52/84/1324   Method used:   Print then Give to Patient   RxID:   [id number] TAZTIA XT 300 MG CP24 (DILTIAZEM HCL ER BEADS) once daily  #30 x 11   Entered by:   Willy Eddy, LPN   Authorized by:   Stacie Glaze MD   Signed by:   Willy Eddy, LPN on 74/25/9563   Method used:   Print then Give to Patient  RxID:   [id number] ASTELIN 137 MCG/SPRAY SOLN (AZELASTINE HCL) two times a day  #1 x 11   Entered by:   Willy Eddy, LPN   Authorized by:   Stacie Glaze MD   Signed by:   Willy Eddy, LPN on 46/96/2952   Method used:   Print then Give to Patient   RxID:   [id number]  ]

## 2010-09-14 NOTE — Progress Notes (Signed)
  Phone Note Call from Patient Call back at Home Phone [phone]   Caller: Patient Call For: Stacie Glaze MD Summary of Call: [facility] [address] Gout left foot and needs prednisone, please. Initial call taken by: [facility] CMA AAMA,  September 01, 2010 9:20 AM  Follow-up for Phone Call        per dr Lovell Sheehan- give colcrys 1 two times a day for 14 days Follow-up by: Willy Eddy, LPN,  September 01, 2010 10:41 AM  Additional Follow-up for Phone Call Additional follow up Details #1::        [phone] about an hour. Additional Follow-up by: Lynann Beaver CMA AAMA,  September 01, 2010 10:51 AM    New/Updated Medications: COLCRYS 0.6 MG TABS (COLCHICINE) one by mouth two times a day x 14 days Prescriptions: COLCRYS 0.6 MG TABS (COLCHICINE) one by mouth two times a day x 14 days  #28 x 0   Entered by:   Lynann Beaver CMA AAMA   Authorized by:   Stacie Glaze MD   Signed by:   Lynann Beaver CMA AAMA on 09/01/2010   Method used:   [facility]        [facility]  [address] [id number]       [address]       [place], [place]  [address]       Ph: [phone] or [phone]       Fax: [phone]   RxID:   [id number]  Notified pt.

## 2010-09-14 NOTE — Progress Notes (Signed)
  Phone Note Call from Patient Call back at Home Phone [phone]   Caller: Patient Call For: Stacie Glaze MD Summary of Call: Bleeding from gums and rectum that started 2 days ago.  The bleeding from the rectum is red and during a BM. [phone] Initial call taken by: Lynann Beaver CMA AAMA,  September 05, 2010 10:30 AM  Follow-up for Phone Call        per Nataley Bahri- how is the gout? cut colcrys back to 1 once daily  Follow-up by: Willy Eddy, LPN,  September 05, 2010 11:16 AM  Additional Follow-up for Phone Call Additional follow up Details #1::        Notified pt.  Additional Follow-up by: Lynann Beaver CMA AAMA,  September 05, 2010 3:01 PM

## 2010-09-14 NOTE — Progress Notes (Signed)
Summary: advise of chest discomfort  Phone Note Call from Patient Call back at Home Phone [phone]   Caller: Patient---triage vm Reason for Call: Refill Medication, Talk to Nurse Summary of Call: complains of chest discomfort. was dx'd with pneumonia the Saturday after Thanksgiving. was given abx and decongestant at [facility]. please advise. no labored breathing. Initial call taken by: Warnell Forester,  July 24, 2010 1:37 PM  Follow-up for Phone Call        ov given with dr Randon Goldsmith this afternoon Follow-up by: Willy Eddy, LPN,  July 24, 2010 2:01 PM

## 2010-09-14 NOTE — Assessment & Plan Note (Signed)
Summary: cough and chest discomfort/bmw   Vital Signs:  Patient profile:   77 year old male Weight:      209 pounds Temp:     98.3 degrees F oral BP sitting:   120 / 70  (right arm) Cuff size:   regular  Vitals Entered By: Duard Brady LPN (July 24, 2010 3:57 PM) CC: c/o chest congestion, stomach issues (diarrhea) Is Patient Diabetic? No   Primary Care Provider:  Stacie Glaze MD  CC:  c/o chest congestion and stomach issues (diarrhea).  History of Present Illness: a 77 year old patient who was treated for pneumonia approximately 3 weeks ago.  At that time.  He presented to the [facility] the weekend after Thanksgiving.  He presented with fever to hundred and one degrees.  At the present time.  He has developed some chest congestion and cough, but no real fever or purulent sputum production.  He is concerned about recurrent pneumonia.  He is also developed some diarrhea.  He has treated hypertension and dyslipidemia  Preventive Screening-Counseling & Management  Alcohol-Tobacco     Smoking Status: quit  Allergies: 1)  * Atuss  Past History:  Past Medical History: Reviewed history from 04/13/2008 and no changes required. Allergic rhinitis Skin cancer, hx of Hypertension Low back pain carple tunnel  Past Surgical History: Reviewed history from 05/09/2007 and no changes required. knee arthroscopy Appendectomy Cholecystectomy Sinus surgery Vasectomy Lumbar laminectomy Lumbar fusion rectal fisure joint replacement hand  Review of Systems       The patient complains of anorexia, fever, and prolonged cough.  The patient denies weight loss, weight gain, vision loss, decreased hearing, hoarseness, chest pain, syncope, dyspnea on exertion, peripheral edema, headaches, hemoptysis, abdominal pain, melena, hematochezia, severe indigestion/heartburn, hematuria, incontinence, genital sores, muscle weakness, suspicious skin lesions, transient blindness, difficulty  walking, depression, unusual weight change, abnormal bleeding, enlarged lymph nodes, angioedema, breast masses, and testicular masses.    Physical Exam  General:  Well-developed,well-nourished,in no acute distress; alert,appropriate and cooperative throughout examination Head:  Normocephalic and atraumatic without obvious abnormalities. No apparent alopecia or balding. Eyes:  No corneal or conjunctival inflammation noted. EOMI. Perrla. Funduscopic exam benign, without hemorrhages, exudates or papilledema. Vision grossly normal. Ears:  External ear exam shows no significant lesions or deformities.  Otoscopic examination reveals clear canals, tympanic membranes are intact bilaterally without bulging, retraction, inflammation or discharge. Hearing is grossly normal bilaterally. Mouth:  Oral mucosa and oropharynx without lesions or exudates.  Teeth in good repair. Neck:  No deformities, masses, or tenderness noted. Lungs:  considerable rales noted involving the left lung base Heart:  Normal rate and regular rhythm. S1 and S2 normal without gallop, murmur, click, rub or other extra sounds.   Impression & Recommendations:  Problem # 1:  URI (ICD-465.9) cannot exclude an early left lower lobe pneumonia.  Will treat with azithromycin  Problem # 2:  HYPERTENSION (ICD-401.9)  His updated medication list for this problem includes:    Taztia Xt 300 Mg Cp24 (Diltiazem hcl er beads) ..... Once daily    Micardis Hct 80-25 Mg Tabs (Telmisartan-hctz) ..... Once daily  His updated medication list for this problem includes:    Taztia Xt 300 Mg Cp24 (Diltiazem hcl er beads) ..... Once daily    Micardis Hct 80-25 Mg Tabs (Telmisartan-hctz) ..... Once daily  Complete Medication List: 1)  Astelin 137 Mcg/spray Soln (Azelastine hcl) .... Two times a day 2)  Taztia Xt 300 Mg Cp24 (Diltiazem hcl er beads) .... Once  daily 3)  Triamcinolone Acetonide 0.5 % Crea (Triamcinolone acetonide) .... As directed 4)   Micardis Hct 80-25 Mg Tabs (Telmisartan-hctz) .... Once daily 5)  Crestor 20 Mg Tabs (Rosuvastatin calcium) .Marland Kitchen.. 1 on monday night 6)  Vicodin 5-500 Mg Tabs (Hydrocodone-acetaminophen) .... One by mouth q 4-6 hours as needed pain 7)  Azithromycin 250 Mg Tabs (Azithromycin) .... Two initially, then one daily for 4 additional days  Patient Instructions: 1)  Get plenty of rest, drink lots of clear liquids, and use Tylenol or Ibuprofen for fever and comfort. Return in 7-10 days if you're not better:sooner if you're feeling worse. Prescriptions: AZITHROMYCIN 250 MG TABS (AZITHROMYCIN) two initially, then one daily for 4 additional days  #6 x 0   Entered and Authorized by:   Gordy Savers  MD   Signed by:   Gordy Savers  MD on 07/24/2010   Method used:   [facility] to        [facility]  [address] [id number]       [address]       [place], [place]  [address]       Ph: [phone] or [phone]       Fax: [phone]   RxID:   [id number]    Orders Added: 1)  Est. Patient Level III [18841]

## 2010-09-28 ENCOUNTER — Encounter: Payer: Self-pay | Admitting: Internal Medicine

## 2010-09-29 ENCOUNTER — Encounter: Payer: Self-pay | Admitting: Internal Medicine

## 2010-09-29 ENCOUNTER — Ambulatory Visit (INDEPENDENT_AMBULATORY_CARE_PROVIDER_SITE_OTHER): Payer: Medicare Other | Admitting: Internal Medicine

## 2010-09-29 DIAGNOSIS — L570 Actinic keratosis: Secondary | ICD-10-CM

## 2010-09-29 DIAGNOSIS — N529 Male erectile dysfunction, unspecified: Secondary | ICD-10-CM

## 2010-09-29 DIAGNOSIS — I10 Essential (primary) hypertension: Secondary | ICD-10-CM

## 2010-09-29 DIAGNOSIS — J309 Allergic rhinitis, unspecified: Secondary | ICD-10-CM

## 2010-09-29 DIAGNOSIS — I2510 Atherosclerotic heart disease of native coronary artery without angina pectoris: Secondary | ICD-10-CM

## 2010-09-29 DIAGNOSIS — E785 Hyperlipidemia, unspecified: Secondary | ICD-10-CM

## 2010-09-29 MED ORDER — VARDENAFIL HCL 10 MG PO TBDP: 10.0000 mg | ORAL_TABLET | Freq: Every day | ORAL | Status: DC | PRN

## 2010-09-29 NOTE — Patient Instructions (Signed)
We recommend that you try Mucinex fast max  Cough and cold liquid one Up to 4 times a day as needed for congestion and sinus symptoms

## 2010-09-29 NOTE — Progress Notes (Signed)
Subjective:    Patient ID: [name], male    DOB: 1931-12-17, 77 y.o.   MRN: [id number]  HPI  Patient is a 76 year old white male who presents for followup of hyperlipidemia hypertension erectile dysfunction indicate an acute complaint of actinic keratosis he has noticed increasing actinic keratosis on his face involving his right ear, his forehead his left cheek and his neck.   The patient states his blood pressures been well-controlled he denies any chest pain shortness of breath PND or orthopnea.   he has had no recent flares of gout he has been tolerating the Crestor 20 mg once a day and pulse therapy. he states that the erectile dysfunction has not responded to the use of the pump and he is interested in repeating a trial of staxyn   Review of Systems  Constitutional: Negative for fever and fatigue.  HENT: Negative for hearing loss, congestion, neck pain and postnasal drip.   Eyes: Negative for discharge, redness and visual disturbance.  Respiratory: Negative for cough, shortness of breath and wheezing.   Cardiovascular: Negative for leg swelling.  Gastrointestinal: Negative for abdominal pain, constipation and abdominal distention.  Genitourinary: Negative for urgency and frequency.  Musculoskeletal: Negative for joint swelling and arthralgias.  Skin: Positive for color change and rash.        Double actinic keratoses and chronic solar changes on his face. male pattern balding  Neurological: Negative for weakness and light-headedness.  Hematological: Negative for adenopathy.  Psychiatric/Behavioral: Negative for behavioral problems.       Past Medical History  Diagnosis Date  . ACTINIC KERATOSIS, HEAD 03/23/2008  . ALLERGIC RHINITIS 03/27/2007  . BENIGN PROSTATIC HYPERTROPHY, WITH URINARY OBSTRUCTION 10/16/2007  . CAD 08/18/2007  . CARPAL TUNNEL SYNDROME, LEFT 04/13/2008  . ERECTILE DYSFUNCTION, ORGANIC 02/20/2010  . Gout, unspecified 02/20/2010  . HYPERLIPIDEMIA, WITH HIGH  HDL 10/18/2008  . HYPERTENSION 05/09/2007  . KNEE PAIN 10/10/2009  . LOW BACK PAIN 03/27/2007  . NEOPLASM, MALIGNANT, SKIN, TRUNK 05/09/2007  . Patellar tendinitis 10/10/2009  . SKIN CANCER, HX OF 03/27/2007  . UNS ADVRS EFF UNS RX MEDICINAL&BIOLOGICAL SBSTNC 08/18/2007   Past Surgical History  Procedure Date  . Knee arthroscopy   . Appendectomy   . Cholecystectomy   . Nasal sinus surgery   . Vasectomy   . Lumbar fusions   . Joint replacement surgery   . Rectal fissure   . Lateral fusion lumbar spine, transverse   . Laminectomy   . Lumbar fusion   . Rectal fissure     reports that he quit smoking about 29 years ago. He does not have any smokeless tobacco history on file. He reports that he does not drink alcohol or use illicit drugs. family history includes Anemia in his father; Dementia in his mother; and Heart disease in his father.     I have reviewed this patient's past medical history surgical history social history current medication list and current allergy list and have documented appropriate changes as necessary  Objective:   Physical Exam     Vital signs were reviewed his blood pressure was 140/80 examination of the scan revealed multiple actinic keratoses most prominently at the pinna of the left ear on his left cheek right cheek and posterior neck. HEENT reveal pupils are equal round reactive to light and accommodation oropharynx is clear neck was supple no carotid bruits were detected lung fields are clear to auscultation and percussion heart examination revealed a regular rate and rhythm abdomen was  soft extremity exam revealed no edema neurologically he was intact with equal grips and deep tendon reflexes in the legs were equal.     Assessment & Plan:   The patient gave informed consent and the site was identified as actinic keratoses 40 seconds of cryotherapy using liquid nitrogen was applied to the lesion the patient tolerated the procedure well post care instructions  were given to the patient

## 2010-09-29 NOTE — Assessment & Plan Note (Signed)
Patient does not report any new chest pain shortness of breath PND orthopnea

## 2010-09-29 NOTE — Assessment & Plan Note (Signed)
Blood pressure is well controlled creatinine and BUN are within normal limits his kidney function appears actually better than expected for age

## 2010-09-29 NOTE — Assessment & Plan Note (Signed)
Patient has several spots of actinic keratoses that we will identify and treat with cryotherapy today the patient gives informed consent and we will use liquid nitrogen and the nitrogen gun 40 seconds of cryotherapy to each lesion

## 2010-09-29 NOTE — Assessment & Plan Note (Signed)
Samples of Staxyn given for ed trial

## 2010-09-29 NOTE — Assessment & Plan Note (Signed)
The patient is on a pulse protocol for Crestor once a week he takes 20 mg weekly this has resulted in a marked increase in his HDL as well as a decrease in his total an LDL cholesterol he is now at or near goal.

## 2010-10-29 ENCOUNTER — Other Ambulatory Visit: Payer: Self-pay | Admitting: Internal Medicine

## 2010-10-31 LAB — CBC
HCT: 34.7 % — ABNORMAL LOW (ref 39.0–52.0)
HCT: 37.3 % — ABNORMAL LOW (ref 39.0–52.0)
HCT: 40.3 % (ref 39.0–52.0)
Hemoglobin: 11.6 g/dL — ABNORMAL LOW (ref 13.0–17.0)
Hemoglobin: 12.6 g/dL — ABNORMAL LOW (ref 13.0–17.0)
Hemoglobin: 13.4 g/dL (ref 13.0–17.0)
MCHC: 33.3 g/dL (ref 30.0–36.0)
MCHC: 33.3 g/dL (ref 30.0–36.0)
MCHC: 33.9 g/dL (ref 30.0–36.0)
MCV: 90.5 fL (ref 78.0–100.0)
MCV: 91.1 fL (ref 78.0–100.0)
MCV: 92 fL (ref 78.0–100.0)
Platelets: 311 10*3/uL (ref 150–400)
Platelets: 369 10*3/uL (ref 150–400)
Platelets: 371 10*3/uL (ref 150–400)
RBC: 3.81 MIL/uL — ABNORMAL LOW (ref 4.22–5.81)
RBC: 4.12 MIL/uL — ABNORMAL LOW (ref 4.22–5.81)
RBC: 4.38 MIL/uL (ref 4.22–5.81)
RDW: 12.5 % (ref 11.5–15.5)
RDW: 13.1 % (ref 11.5–15.5)
RDW: 13.2 % (ref 11.5–15.5)
WBC: 11.1 10*3/uL — ABNORMAL HIGH (ref 4.0–10.5)
WBC: 11.7 10*3/uL — ABNORMAL HIGH (ref 4.0–10.5)
WBC: 13.8 10*3/uL — ABNORMAL HIGH (ref 4.0–10.5)

## 2010-10-31 LAB — BASIC METABOLIC PANEL
BUN: 12 mg/dL (ref 6–23)
BUN: 14 mg/dL (ref 6–23)
BUN: 9 mg/dL (ref 6–23)
CO2: 29 mEq/L (ref 19–32)
CO2: 30 mEq/L (ref 19–32)
CO2: 32 mEq/L (ref 19–32)
Calcium: 8.3 mg/dL — ABNORMAL LOW (ref 8.4–10.5)
Calcium: 8.3 mg/dL — ABNORMAL LOW (ref 8.4–10.5)
Calcium: 8.9 mg/dL (ref 8.4–10.5)
Chloride: 96 mEq/L (ref 96–112)
Chloride: 98 mEq/L (ref 96–112)
Chloride: 98 mEq/L (ref 96–112)
Creatinine, Ser: 1.03 mg/dL (ref 0.4–1.5)
Creatinine, Ser: 1.07 mg/dL (ref 0.4–1.5)
Creatinine, Ser: 1.21 mg/dL (ref 0.4–1.5)
GFR calc Af Amer: >60 mL/min (ref >60)
GFR calc Af Amer: >60 mL/min (ref >60)
GFR calc Af Amer: >60 mL/min (ref >60)
GFR calc non Af Amer: 58 mL/min — ABNORMAL LOW (ref >60)
GFR calc non Af Amer: >60 mL/min (ref >60)
GFR calc non Af Amer: >60 mL/min (ref >60)
Glucose, Bld: 121 mg/dL — ABNORMAL HIGH (ref 70–99)
Glucose, Bld: 123 mg/dL — ABNORMAL HIGH (ref 70–99)
Glucose, Bld: 184 mg/dL — ABNORMAL HIGH (ref 70–99)
Potassium: 3.1 mEq/L — ABNORMAL LOW (ref 3.5–5.1)
Potassium: 3.5 mEq/L (ref 3.5–5.1)
Potassium: 4.3 mEq/L (ref 3.5–5.1)
Sodium: 133 mEq/L — ABNORMAL LOW (ref 135–145)
Sodium: 135 mEq/L (ref 135–145)
Sodium: 135 mEq/L (ref 135–145)

## 2010-10-31 LAB — PROTIME-INR
INR: 1.28 (ref 0.00–1.49)
INR: 1.44 (ref 0.00–1.49)
INR: 2.24 — ABNORMAL HIGH (ref 0.00–1.49)
Prothrombin Time: 15.9 seconds — ABNORMAL HIGH (ref 11.6–15.2)
Prothrombin Time: 17.4 seconds — ABNORMAL HIGH (ref 11.6–15.2)
Prothrombin Time: 24.6 seconds — ABNORMAL HIGH (ref 11.6–15.2)

## 2010-11-01 LAB — URINALYSIS, ROUTINE W REFLEX MICROSCOPIC
Bilirubin Urine: NEGATIVE
Glucose, UA: NEGATIVE mg/dL
Hgb urine dipstick: NEGATIVE
Ketones, ur: NEGATIVE mg/dL
Nitrite: NEGATIVE
Protein, ur: NEGATIVE mg/dL
Specific Gravity, Urine: 1.011 (ref 1.005–1.030)
Urobilinogen, UA: 0.2 mg/dL (ref 0.0–1.0)
pH: 7 (ref 5.0–8.0)

## 2010-11-01 LAB — COMPREHENSIVE METABOLIC PANEL
ALT: 33 U/L (ref 0–53)
AST: 24 U/L (ref 0–37)
Albumin: 3.9 g/dL (ref 3.5–5.2)
Alkaline Phosphatase: 68 U/L (ref 39–117)
BUN: 17 mg/dL (ref 6–23)
CO2: 28 mEq/L (ref 19–32)
Calcium: 9.5 mg/dL (ref 8.4–10.5)
Chloride: 103 mEq/L (ref 96–112)
Creatinine, Ser: 1.05 mg/dL (ref 0.4–1.5)
GFR calc Af Amer: >60 mL/min (ref >60)
GFR calc non Af Amer: >60 mL/min (ref >60)
Glucose, Bld: 95 mg/dL (ref 70–99)
Potassium: 3.7 mEq/L (ref 3.5–5.1)
Sodium: 139 mEq/L (ref 135–145)
Total Bilirubin: 0.6 mg/dL (ref 0.3–1.2)
Total Protein: 7 g/dL (ref 6.0–8.3)

## 2010-11-01 LAB — CBC
HCT: 38.8 % — ABNORMAL LOW (ref 39.0–52.0)
HCT: 45.8 % (ref 39.0–52.0)
Hemoglobin: 13.1 g/dL (ref 13.0–17.0)
Hemoglobin: 15.4 g/dL (ref 13.0–17.0)
MCHC: 33.6 g/dL (ref 30.0–36.0)
MCHC: 33.7 g/dL (ref 30.0–36.0)
MCV: 90.5 fL (ref 78.0–100.0)
MCV: 91.3 fL (ref 78.0–100.0)
Platelets: 338 10*3/uL (ref 150–400)
Platelets: 374 10*3/uL (ref 150–400)
RBC: 4.29 MIL/uL (ref 4.22–5.81)
RBC: 5.02 MIL/uL (ref 4.22–5.81)
RDW: 13.1 % (ref 11.5–15.5)
RDW: 13.3 % (ref 11.5–15.5)
WBC: 10.1 10*3/uL (ref 4.0–10.5)
WBC: 6.5 10*3/uL (ref 4.0–10.5)

## 2010-11-01 LAB — DIFFERENTIAL
Basophils Absolute: 0 10*3/uL (ref 0.0–0.1)
Basophils Relative: 1 % (ref 0–1)
Eosinophils Absolute: 0.2 10*3/uL (ref 0.0–0.7)
Eosinophils Relative: 4 % (ref 0–5)
Lymphocytes Relative: 29 % (ref 12–46)
Lymphs Abs: 1.9 10*3/uL (ref 0.7–4.0)
Monocytes Absolute: 0.6 10*3/uL (ref 0.1–1.0)
Monocytes Relative: 10 % (ref 3–12)
Neutro Abs: 3.7 10*3/uL (ref 1.7–7.7)
Neutrophils Relative %: 57 % (ref 43–77)

## 2010-11-01 LAB — PROTIME-INR
INR: 0.99 (ref 0.00–1.49)
INR: 1.08 (ref 0.00–1.49)
Prothrombin Time: 13 seconds (ref 11.6–15.2)
Prothrombin Time: 13.9 seconds (ref 11.6–15.2)

## 2010-11-01 LAB — APTT: aPTT: 41 seconds — ABNORMAL HIGH (ref 24–37)

## 2010-11-01 LAB — BASIC METABOLIC PANEL
BUN: 10 mg/dL (ref 6–23)
CO2: 27 mEq/L (ref 19–32)
Calcium: 8.1 mg/dL — ABNORMAL LOW (ref 8.4–10.5)
Chloride: 99 mEq/L (ref 96–112)
Creatinine, Ser: 0.93 mg/dL (ref 0.4–1.5)
GFR calc Af Amer: >60 mL/min (ref >60)
GFR calc non Af Amer: >60 mL/min (ref >60)
Glucose, Bld: 146 mg/dL — ABNORMAL HIGH (ref 70–99)
Potassium: 3.6 mEq/L (ref 3.5–5.1)
Sodium: 132 mEq/L — ABNORMAL LOW (ref 135–145)

## 2010-11-01 LAB — TYPE AND SCREEN
ABO/RH(D): O POS
Antibody Screen: NEGATIVE

## 2010-11-01 LAB — ABO/RH: ABO/RH(D): O POS

## 2010-11-22 LAB — BASIC METABOLIC PANEL
BUN: 13 mg/dL (ref 6–23)
CO2: 26 mEq/L (ref 19–32)
Calcium: 9 mg/dL (ref 8.4–10.5)
Chloride: 106 mEq/L (ref 96–112)
Creatinine, Ser: 1.06 mg/dL (ref 0.4–1.5)
GFR calc Af Amer: >60 mL/min (ref >60)
GFR calc non Af Amer: >60 mL/min (ref >60)
Glucose, Bld: 110 mg/dL — ABNORMAL HIGH (ref 70–99)
Potassium: 3.9 mEq/L (ref 3.5–5.1)
Sodium: 138 mEq/L (ref 135–145)

## 2010-11-22 LAB — POCT HEMOGLOBIN-HEMACUE: Hemoglobin: 16.7 g/dL (ref 13.0–17.0)

## 2010-11-29 ENCOUNTER — Other Ambulatory Visit (INDEPENDENT_AMBULATORY_CARE_PROVIDER_SITE_OTHER): Payer: Medicare Other | Admitting: Internal Medicine

## 2010-11-29 DIAGNOSIS — E785 Hyperlipidemia, unspecified: Secondary | ICD-10-CM

## 2010-11-29 DIAGNOSIS — I10 Essential (primary) hypertension: Secondary | ICD-10-CM

## 2010-11-29 DIAGNOSIS — T887XXA Unspecified adverse effect of drug or medicament, initial encounter: Secondary | ICD-10-CM

## 2010-11-29 LAB — HEPATIC FUNCTION PANEL
ALT: 23 U/L (ref 0–53)
AST: 23 U/L (ref 0–37)
Albumin: 4.1 g/dL (ref 3.5–5.2)
Alkaline Phosphatase: 60 U/L (ref 39–117)
Bilirubin, Direct: 0.2 mg/dL (ref 0.0–0.3)
Total Bilirubin: 1.1 mg/dL (ref 0.3–1.2)
Total Protein: 6.7 g/dL (ref 6.0–8.3)

## 2010-11-29 LAB — LIPID PANEL
Cholesterol: 176 mg/dL (ref 0–200)
HDL: 59.4 mg/dL (ref >39.00)
LDL Cholesterol: 102 mg/dL — ABNORMAL HIGH (ref 0–99)
Total CHOL/HDL Ratio: 3
Triglycerides: 71 mg/dL (ref 0.0–149.0)
VLDL: 14.2 mg/dL (ref 0.0–40.0)

## 2010-11-29 LAB — BASIC METABOLIC PANEL
BUN: 25 mg/dL — ABNORMAL HIGH (ref 6–23)
CO2: 28 mEq/L (ref 19–32)
Calcium: 9.8 mg/dL (ref 8.4–10.5)
Chloride: 104 mEq/L (ref 96–112)
Creatinine, Ser: 1.2 mg/dL (ref 0.4–1.5)
GFR: 60.04 mL/min (ref >60.00)
Glucose, Bld: 75 mg/dL (ref 70–99)
Potassium: 4.3 mEq/L (ref 3.5–5.1)
Sodium: 141 mEq/L (ref 135–145)

## 2010-12-15 ENCOUNTER — Other Ambulatory Visit: Payer: Self-pay | Admitting: Internal Medicine

## 2010-12-25 ENCOUNTER — Ambulatory Visit (INDEPENDENT_AMBULATORY_CARE_PROVIDER_SITE_OTHER): Payer: Medicare Other | Admitting: Internal Medicine

## 2010-12-25 VITALS — BP 146/80 | HR 76 | Temp 98.2°F | Resp 14 | Ht 72.0 in | Wt 210.0 lb

## 2010-12-25 DIAGNOSIS — L570 Actinic keratosis: Secondary | ICD-10-CM

## 2010-12-25 DIAGNOSIS — E785 Hyperlipidemia, unspecified: Secondary | ICD-10-CM

## 2010-12-25 DIAGNOSIS — I10 Essential (primary) hypertension: Secondary | ICD-10-CM

## 2010-12-25 DIAGNOSIS — I2510 Atherosclerotic heart disease of native coronary artery without angina pectoris: Secondary | ICD-10-CM

## 2010-12-25 NOTE — Assessment & Plan Note (Signed)
Referral to derm for copay

## 2010-12-25 NOTE — Assessment & Plan Note (Signed)
Stable blood pressure with goof creat and potassium on ARB

## 2010-12-25 NOTE — Assessment & Plan Note (Signed)
Added fiber and taking the pulse statin

## 2010-12-25 NOTE — Assessment & Plan Note (Signed)
No reported chest pain or exertional angina

## 2010-12-26 NOTE — Op Note (Signed)
NAMEALPHONSA, [name]               ACCOUNT NO.:  [id number]   MEDICAL RECORD NO.:  [id number]          PATIENT TYPE:  AMB   LOCATION:  DSC                          FACILITY:  [facility]   PHYSICIAN:  Katy Fitch. Sypher, M.D. DATE OF BIRTH:  07/08/1932   DATE OF PROCEDURE:  11/19/2008  DATE OF DISCHARGE:                               OPERATIVE REPORT   PREOPERATIVE DIAGNOSIS:  Entrapment neuropathy, median nerve, right  carpal tunnel with significant background osteoarthrosis, particularly  involving the thumb carpometacarpal joint.   POSTOPERATIVE DIAGNOSIS:  Entrapment neuropathy, median nerve, right  carpal tunnel with significant background osteoarthrosis, particularly  involving the thumb carpometacarpal joint.   OPERATIONS:  Release of right transverse carpal ligament.   OPERATIONS:  Katy Fitch. Sypher, MD   ASSISTANT:  None.   ANESTHESIA:  General by LMA.   SUPERVISING ANESTHESIOLOGIST:  Zenon Mayo, MD   INDICATIONS:  [name] is a 77 year old gentleman referred through  the courtesy of Dr. Darryll Capers for evaluation and management of  arthritis and hand pain.  Clinical examination revealed signs of  significant degenerative arthritis involving all his interphalangeal  joints and his thumb carpometacarpal joint bilaterally.  He had type 1 Z-  collapse deformities of the thumb.  Clinical examination suggested  significant carpal tunnel syndrome.  Electrodiagnostic studies confirmed  severe right carpal tunnel syndrome and moderate left carpal tunnel  syndrome.   Due to a failed response to nonoperative measures, he is brought to the  operating at this time for release of his right transcarpal ligament.   PROCEDURE:  [name] was brought to the operating room and placed  in the supine position upon operating table.   Following the induction of general anesthesia by LMA technique, the  right arm was prepped with Betadine soap and solution, sterilely  draped.  A pneumatic tourniquet was applied to the proximal brachium.   Following exsanguination of the right arm with an Esmarch bandage,  arterial tourniquet was inflated to 230 mmHg.  The procedure commenced  with a short incision in the line of the ring finger and palm.  Subcutaneous tissues were carefully divided revealing the palmar fascia.  This is split longitudinally to reveal the common sensory branch of the  median nerve.  These were followed back to transcarpal ligament which  was gently isolated from the median nerve.  The ligament was then  released along its ulnar border extending into the distal forearm.  The  wound was inspected for bleeding points which were electrocauterized by  bipolar current followed by repair of the skin with intradermal 3-0  Prolene suture.   A compressive dressing was applied with a volar plaster splint  maintaining the wrist in 5 degrees of dorsiflexion.   For aftercare, [name] is provided a prescription for Darvocet-N 100  one p.o. q.4-6 hours p.r.n. pain, 20 tablets without refill.  He will  use Advil or Aleve as needed with the Darvocet.      Katy Fitch Sypher, M.D.  Electronically Signed     RVS/MEDQ  D:  11/19/2008  T:  11/19/2008  Job:  [id number]   cc:   Stacie Glaze, MD

## 2010-12-26 NOTE — Op Note (Signed)
NAMESNEIJDER, BERNARDS               ACCOUNT NO.:  [id number]   MEDICAL RECORD NO.:  [id number]          PATIENT TYPE:  AMB   LOCATION:  DSC                          FACILITY:  [facility]   PHYSICIAN:  [name], M.D. DATE OF BIRTH:  [birth year]   DATE OF PROCEDURE:  04/22/2007  DATE OF DISCHARGE:                               OPERATIVE REPORT   PREOPERATIVE DIAGNOSIS:  Painful osteoarthritis/calcium pyrophosphate  deposition disease, left long and ring finger metacarpal phalangeal  joints.   POSTOPERATIVE DIAGNOSIS:  Painful osteoarthritis/calcium pyrophosphate  deposition disease, left long and ring finger metacarpal phalangeal  joints.   OPERATION:  1. Size 50 NuFlex implant arthroplasty of left long finger metacarpal      phalangeal joint with synovectomy and radial collateral ligament      reconstruction.  2. Size 40 NuFlex implant arthroplasty left ring finger metacarpal      phalangeal joint with radial collateral ligament reconstruction.   OPERATING SURGEON:  [name], M.D.   ASSISTANT:  Jordan Maduro Dasnoit PA-C.   ANESTHESIA:  Infraclavicular block left upper extremity, supervising  anesthesiologist is Dr. Autumn Patty.   INDICATIONS:  [name] is a 77 year old gentleman referred through  the courtesy of Dr. Darryll Capers for evaluation and management of severe  pain and loss of motion in his left long ring finger MP joints.   Clinical examination revealed advanced osteoarthritis of the long and  ring finger metacarpal phalangeal joints and the suggestion of  chondrocalcinosis.   [name] has not responded to activity modification, anti-  inflammatory medication, analgesics or steroid injection.   Due to failure to respond to all these nonoperative measures.  After  informed consent he is now brought to the operating room anticipating  implant arthroplasty of his left long and ring finger  metacarpophalangeal joints.   Preoperatively he was advised  that our primary indication for surgery is  pain relief.  A secondary indication is to facilitate useful hand  function by increasing his grip prehension strength by pain relief.   He understands that the implant arthroplasties will never function as  normal joints would.  Our goal was to restore mobility and pain free  grip.   Preoperatively he was advised of potential risks, benefits of surgery.  After informed consent he is brought to the operating room at this time.   PROCEDURE:  [name] is brought to operating room and placed in  supine position on the operating table.   [name] provided anesthesia consult in the holding area.  Dr.  Sampson [name] placed a infraclavicular block without complication.  Excellent anesthesia of the left upper extremity was achieved.   [name] is brought to room 1, placed supine position on the  operating table and under [name] direct supervision general  sedation provided.   [name] left arm was prepped with Betadine soap solution,  sterilely draped.  1 gram of Ancef was administered as IV prophylactic  antibiotic.   The left arm was exsanguinated with Esmarch bandage and the arterial  tourniquet inflated to 250 mmHg.  The procedure commenced with a  transverse incision extending from the index metacarpal ulnar aspect to  the small finger metacarpal radial aspect.  The subcutaneous tissues  were carefully divided taking care to identify and protect the dorsal  veins and sensory branches.  The extensor mechanisms of the long and  ring fingers were cleared of soft tissue and fascia followed by midline  incisions splitting the extensor tendons, exposing the capsule of the MP  joints.   The capsules of the MP joints were split longitudinally and elevated  subperiosteally exposing the collateral ligaments.   The ulnar collateral ligament was completely released, the radial  collateral ligament released about 80%.  After  [name] were  placed, protecting the palmar structures including the volar plate and  neurovascular structures.  The long finger metacarpal head was removed  with an oscillating saw followed by similar protection of the soft  tissues from the ring finger and removal of the ring finger metacarpal  head.   The bases of the proximal phalanges of the long and ring fingers were  prepared identically with the use of a bone awl to find the  intermedullary canal followed by use of a mid sized Swanson bur to  tailor the bone to accept in the long finger a size 50 trial implant and  in the ring finger a size 40 trial implant.   The metacarpal necks and shafts were prepared by hand with a bone  cutting rasp utilizing a mallet.   After irrigation and synovectomy of both joints.  The implants were  trialed with a size 50 in the  long finger and size 40 in the ring  finger.  AP x-ray images and obliques documented satisfactory position  of the implants and adequate bone carpentry.   The trials were removed and the wounds irrigated with sterile saline.   Drill holes were created to reconstruct the radial collateral ligaments  through the long and ring finger metacarpal necks.  A 3-0 FiberWire  suture was placed for reconstruction of the radial collateral ligament  origin.   The implants were placed with no-touch technique followed by  reconstruction of the radial collateral ligament with proper tension to  create slight radial deviation through the long and ring finger MP  joints.   The capsules were then repaired with interrupted suture of 4-0 Vicryl  followed by anatomic reconstruction of the extensor mechanism with a  series of 3-0 Ethibond figure-of-eight sutures with knots buried.   The wounds were lavaged sterile saline followed by repair of the skin  with subcutaneous 4-0 Vicryl and intradermal 3-0 Prolene and Steri-  Strips.   [name] was placed in a voluminous gauze and  Kerlix dressing with  dorsal palmar plaster sandwich splints maintaining the MP joints at 30  degrees of flexion.   There were no apparent complications.   [name] tolerated surgery and anesthesia well.  Transferred to  recovery room with stable vital signs.   He will be discharged home with prescriptions for doxycycline 100 mg  p.o. b.i.d. times 5 days, Motrin 600 mg one p.o. q.6 h. with food p.r.n.  pain 30 tablets with one refill and Dilaudid 2 mg one or two tablets  p.o. q. 4-6 hours p.r.n. pain 30 tablets without refill.   April 22, 2007      [name], M.D.  Electronically Signed     RVS/MEDQ  D:  04/22/2007  T:  04/23/2007  Job:  [id number]   cc:   [name]  Jordan Sayers, MD

## 2011-01-08 ENCOUNTER — Other Ambulatory Visit: Payer: Self-pay | Admitting: Internal Medicine

## 2011-02-15 ENCOUNTER — Telehealth: Payer: Self-pay | Admitting: *Deleted

## 2011-02-15 ENCOUNTER — Other Ambulatory Visit: Payer: Self-pay | Admitting: Internal Medicine

## 2011-02-15 NOTE — Telephone Encounter (Signed)
Prednisone 20 mg #14 one twice a day 

## 2011-02-15 NOTE — Telephone Encounter (Signed)
Pt is having a gout flare, and the only med that works for him is Prednisone..  Is asking for RX.

## 2011-02-16 ENCOUNTER — Other Ambulatory Visit: Payer: Self-pay | Admitting: *Deleted

## 2011-02-16 ENCOUNTER — Telehealth: Payer: Self-pay | Admitting: *Deleted

## 2011-02-16 MED ORDER — PREDNISONE 20 MG PO TABS: 20.0000 mg | ORAL_TABLET | Freq: Two times a day (BID) | ORAL | Status: DC

## 2011-02-16 NOTE — Telephone Encounter (Signed)
Pt informed of prednisone being called in from previous message

## 2011-03-06 ENCOUNTER — Telehealth: Payer: Self-pay | Admitting: *Deleted

## 2011-03-06 MED ORDER — METHYLPREDNISOLONE 4 MG PO KIT: PACK | ORAL | Status: DC

## 2011-03-06 NOTE — Telephone Encounter (Signed)
Pt calls complaining of right ankle pain (gout), and would like treatment for this called to [facility].)

## 2011-03-06 NOTE — Telephone Encounter (Signed)
Only one pack of medrol dose pack called to pharmacy and pt notified.

## 2011-03-06 NOTE — Telephone Encounter (Addendum)
Pt is on prednisone drops since his cataract surgery.  Is there a problem with this?

## 2011-03-07 NOTE — Telephone Encounter (Signed)
Notified pt. 

## 2011-03-07 NOTE — Telephone Encounter (Signed)
It is ok to take the prednisone dose pack

## 2011-04-26 ENCOUNTER — Encounter: Payer: Self-pay | Admitting: Internal Medicine

## 2011-04-26 ENCOUNTER — Ambulatory Visit (INDEPENDENT_AMBULATORY_CARE_PROVIDER_SITE_OTHER): Payer: Medicare Other | Admitting: Internal Medicine

## 2011-04-26 VITALS — BP 138/78 | HR 80 | Temp 98.2°F | Resp 16 | Ht 72.0 in | Wt 208.0 lb

## 2011-04-26 DIAGNOSIS — K5909 Other constipation: Secondary | ICD-10-CM

## 2011-04-26 DIAGNOSIS — R972 Elevated prostate specific antigen [PSA]: Secondary | ICD-10-CM

## 2011-04-26 DIAGNOSIS — B079 Viral wart, unspecified: Secondary | ICD-10-CM

## 2011-04-26 DIAGNOSIS — L570 Actinic keratosis: Secondary | ICD-10-CM

## 2011-04-26 DIAGNOSIS — I10 Essential (primary) hypertension: Secondary | ICD-10-CM

## 2011-04-26 DIAGNOSIS — H02403 Unspecified ptosis of bilateral eyelids: Secondary | ICD-10-CM

## 2011-04-26 NOTE — Patient Instructions (Addendum)
A fiber supplement you may use is  Benefiber   you can they put a scoop of Benefiber in any liquid A referral has been made to [facility] ophthalmology for the ptosis of the eyelid

## 2011-04-26 NOTE — Progress Notes (Signed)
Subjective:    Patient ID: [name], male    DOB: 11/07/1931, 77 y.o.   MRN: [id number]  HPI Blood pressure been stable and he has tolerated his medications without difficulty He does have chronic constipation and has not been using fiber therapy correctly His history of an elevated PSA and requires monitoring.  Actinic keratosis on the face and forehead. these are recurrent. Wart Work on elbow on right arm Varicose veins Requesting referral to [facility] for eye lids does involve blocking his peripheral vision due to excessive lids   Review of Systems  Constitutional: Negative for fever and fatigue.  HENT: Negative for hearing loss, congestion, neck pain and postnasal drip.   Eyes: Negative for discharge, redness and visual disturbance.  Respiratory: Negative for cough, shortness of breath and wheezing.   Cardiovascular: Negative for leg swelling.  Gastrointestinal: Negative for abdominal pain, constipation and abdominal distention.  Genitourinary: Negative for urgency and frequency.  Musculoskeletal: Negative for joint swelling and arthralgias.  Skin: Negative for color change and rash.  Neurological: Negative for weakness and light-headedness.  Hematological: Negative for adenopathy.  Psychiatric/Behavioral: Negative for behavioral problems.       Past Medical History  Diagnosis Date  . ACTINIC KERATOSIS, HEAD 03/23/2008  . ALLERGIC RHINITIS 03/27/2007  . BENIGN PROSTATIC HYPERTROPHY, WITH URINARY OBSTRUCTION 10/16/2007  . CAD 08/18/2007  . CARPAL TUNNEL SYNDROME, LEFT 04/13/2008  . ERECTILE DYSFUNCTION, ORGANIC 02/20/2010  . Gout, unspecified 02/20/2010  . HYPERLIPIDEMIA, WITH HIGH HDL 10/18/2008  . HYPERTENSION 05/09/2007  . KNEE PAIN 10/10/2009  . LOW BACK PAIN 03/27/2007  . NEOPLASM, MALIGNANT, SKIN, TRUNK 05/09/2007  . Patellar tendinitis 10/10/2009  . SKIN CANCER, HX OF 03/27/2007  . UNS ADVRS EFF UNS RX MEDICINAL&BIOLOGICAL SBSTNC 08/18/2007   Past Surgical History    Procedure Date  . Knee arthroscopy   . Appendectomy   . Cholecystectomy   . Nasal sinus surgery   . Vasectomy   . Lumbar fusions   . Joint replacement surgery   . Rectal fissure   . Lateral fusion lumbar spine, transverse   . Laminectomy   . Lumbar fusion   . Rectal fissure     reports that he quit smoking about 29 years ago. He does not have any smokeless tobacco history on file. He reports that he does not drink alcohol or use illicit drugs. family history includes Anemia in his father; Dementia in his mother; and Heart disease in his father. No Known Allergies  Objective:   Physical Exam  Nursing note and vitals reviewed. Constitutional: He appears well-developed and well-nourished.  HENT:  Head: Normocephalic and atraumatic.       Excessive eye lids especially in the upper lids involving limitation of field of vision  Eyes: Conjunctivae are normal. Pupils are equal, round, and reactive to light.  Neck: Normal range of motion. Neck supple.  Cardiovascular: Normal rate and regular rhythm.   Pulmonary/Chest: Effort normal and breath sounds normal.  Abdominal: Soft. Bowel sounds are normal.  Skin:       Keratoses on the forehead and bronchus wart on left elbow          Assessment & Plan:  Pressure stable on current medication tolerating medications well shortness of breath chest pain PND orthopnea reported.  He has a wart on his left elbow that we treated with cryotherapy and he is two actinic keratoses on his forehead.  He gave informed consent and we treated both a wart and the second keratoses  with cryotherapy he tolerated the procedure well complete ablation of the lesions was achieved The source of his problem list is stable followup in 4 months time

## 2011-04-27 ENCOUNTER — Ambulatory Visit: Payer: 59 | Admitting: Internal Medicine

## 2011-04-27 LAB — PSA, TOTAL AND FREE
PSA, Free Pct: 25 % (ref >25)
PSA, Free: 0.22 ng/mL
PSA: 0.89 ng/mL (ref <=4.00)

## 2011-05-10 ENCOUNTER — Encounter: Payer: Self-pay | Admitting: Internal Medicine

## 2011-05-21 ENCOUNTER — Telehealth: Payer: Self-pay | Admitting: Internal Medicine

## 2011-05-21 DIAGNOSIS — H02409 Unspecified ptosis of unspecified eyelid: Secondary | ICD-10-CM

## 2011-05-21 NOTE — Telephone Encounter (Signed)
May have referral per drjenkins for blephroplasty  for diagnosis of blepharoptosis

## 2011-05-21 NOTE — Telephone Encounter (Signed)
Pt would like a referral to [facility] eye care dx drooping eyelids.

## 2011-05-25 LAB — BASIC METABOLIC PANEL
BUN: 17
CO2: 25
Calcium: 9.6
Chloride: 107
Creatinine, Ser: 0.94
GFR calc Af Amer: >60
GFR calc non Af Amer: >60
Glucose, Bld: 106 — ABNORMAL HIGH
Potassium: 4.3
Sodium: 141

## 2011-05-25 LAB — POCT HEMOGLOBIN-HEMACUE
Hemoglobin: 16
Operator id: 123881

## 2011-06-04 ENCOUNTER — Ambulatory Visit (INDEPENDENT_AMBULATORY_CARE_PROVIDER_SITE_OTHER): Payer: Medicare Other | Admitting: Internal Medicine

## 2011-06-04 DIAGNOSIS — Z23 Encounter for immunization: Secondary | ICD-10-CM

## 2011-08-28 ENCOUNTER — Encounter: Payer: Self-pay | Admitting: Internal Medicine

## 2011-08-28 ENCOUNTER — Ambulatory Visit (INDEPENDENT_AMBULATORY_CARE_PROVIDER_SITE_OTHER): Payer: Medicare Other | Admitting: Internal Medicine

## 2011-08-28 VITALS — BP 144/80 | HR 88 | Temp 98.6°F | Resp 16 | Ht 72.0 in | Wt 212.0 lb

## 2011-08-28 DIAGNOSIS — E785 Hyperlipidemia, unspecified: Secondary | ICD-10-CM

## 2011-08-28 DIAGNOSIS — M545 Low back pain, unspecified: Secondary | ICD-10-CM

## 2011-08-28 DIAGNOSIS — H02409 Unspecified ptosis of unspecified eyelid: Secondary | ICD-10-CM

## 2011-08-28 DIAGNOSIS — I10 Essential (primary) hypertension: Secondary | ICD-10-CM

## 2011-08-28 DIAGNOSIS — M109 Gout, unspecified: Secondary | ICD-10-CM | POA: Diagnosis not present

## 2011-08-28 DIAGNOSIS — N529 Male erectile dysfunction, unspecified: Secondary | ICD-10-CM

## 2011-08-28 LAB — HEPATIC FUNCTION PANEL
ALT: 28 U/L (ref 0–53)
AST: 27 U/L (ref 0–37)
Albumin: 4 g/dL (ref 3.5–5.2)
Alkaline Phosphatase: 64 U/L (ref 39–117)
Bilirubin, Direct: 0.1 mg/dL (ref 0.0–0.3)
Total Bilirubin: 1 mg/dL (ref 0.3–1.2)
Total Protein: 6.8 g/dL (ref 6.0–8.3)

## 2011-08-28 LAB — LIPID PANEL
Cholesterol: 161 mg/dL (ref 0–200)
HDL: 66.8 mg/dL (ref >39.00)
LDL Cholesterol: 75 mg/dL (ref 0–99)
Total CHOL/HDL Ratio: 2
Triglycerides: 94 mg/dL (ref 0.0–149.0)
VLDL: 18.8 mg/dL (ref 0.0–40.0)

## 2011-08-28 MED ORDER — TRIAMCINOLONE ACETONIDE 0.5 % EX CREA: TOPICAL_CREAM | Freq: Two times a day (BID) | CUTANEOUS | Status: DC

## 2011-08-28 MED ORDER — VARDENAFIL HCL 10 MG PO TBDP: 10.0000 mg | ORAL_TABLET | Freq: Every day | ORAL | Status: DC | PRN

## 2011-08-28 MED ORDER — DILTIAZEM HCL ER COATED BEADS 300 MG PO CP24: 300.0000 mg | ORAL_CAPSULE | Freq: Every day | ORAL | Status: DC

## 2011-08-28 MED ORDER — TELMISARTAN-HCTZ 80-25 MG PO TABS: 1.0000 | ORAL_TABLET | Freq: Every day | ORAL | Status: DC

## 2011-08-28 NOTE — Patient Instructions (Signed)
The patient is instructed to continue all medications as prescribed. Schedule followup with check out clerk upon leaving the clinic  

## 2011-08-28 NOTE — Progress Notes (Signed)
Subjective:    Patient ID: [name], male    DOB: [birth year], 78 y.o.   MRN: [id number]  HPI  The patient is a 78 year old male who presents for followup of hyperlipidemia on pulse therapy with a statin.  He has a history of gouty arthritis with no current symptoms his hypertension that is controlled and a history of coronary disease that is asymptomatic.  He is status post total me replacement with good result he is compliant with medications.   Review of Systems  Constitutional: Negative for fever and fatigue.  HENT: Negative for hearing loss, congestion, neck pain and postnasal drip.   Eyes: Negative for discharge, redness and visual disturbance.  Respiratory: Negative for cough, shortness of breath and wheezing.   Cardiovascular: Negative for leg swelling.  Gastrointestinal: Negative for abdominal pain, constipation and abdominal distention.  Genitourinary: Negative for urgency and frequency.  Musculoskeletal: Negative for joint swelling and arthralgias.  Skin: Negative for color change and rash.  Neurological: Negative for weakness and light-headedness.  Hematological: Negative for adenopathy.  Psychiatric/Behavioral: Negative for behavioral problems.   Past Medical History  Diagnosis Date  . ACTINIC KERATOSIS, HEAD 03/23/2008  . ALLERGIC RHINITIS 03/27/2007  . BENIGN PROSTATIC HYPERTROPHY, WITH URINARY OBSTRUCTION 10/16/2007  . CAD 08/18/2007  . CARPAL TUNNEL SYNDROME, LEFT 04/13/2008  . ERECTILE DYSFUNCTION, ORGANIC 02/20/2010  . Gout, unspecified 02/20/2010  . HYPERLIPIDEMIA, WITH HIGH HDL 10/18/2008  . HYPERTENSION 05/09/2007  . KNEE PAIN 10/10/2009  . LOW BACK PAIN 03/27/2007  . NEOPLASM, MALIGNANT, SKIN, TRUNK 05/09/2007  . Patellar tendinitis 10/10/2009  . SKIN CANCER, HX OF 03/27/2007  . UNS ADVRS EFF UNS RX MEDICINAL&BIOLOGICAL SBSTNC 08/18/2007    History   Social History  . Marital Status: Married    Spouse Name: N/A    Number of Children: N/A  . Years of  Education: N/A   Occupational History  . Not on file.   Social History Main Topics  . Smoking status: Former Smoker    Quit date: 09/29/1981  . Smokeless tobacco: Not on file  . Alcohol Use: No  . Drug Use: No  . Sexually Active: Yes   Other Topics Concern  . Not on file   Social History Narrative  . No narrative on file    Past Surgical History  Procedure Date  . Knee arthroscopy   . Appendectomy   . Cholecystectomy   . Nasal sinus surgery   . Vasectomy   . Lumbar fusions   . Joint replacement surgery   . Rectal fissure   . Lateral fusion lumbar spine, transverse   . Laminectomy   . Lumbar fusion   . Rectal fissure     Family History  Problem Relation Age of Onset  . Dementia Mother   . Anemia Father   . Heart disease Father     No Known Allergies  Current Outpatient Prescriptions on File Prior to Visit  Medication Sig Dispense Refill  . rosuvastatin (CRESTOR) 20 MG tablet Take 20 mg by mouth once a week.         BP 144/80  Pulse 88  Temp 98.6 F (37 C)  Resp 16  Ht 6' (1.829 m)  Wt 212 lb (96.163 kg)  BMI 28.75 kg/m2       Objective:   Physical Exam  Constitutional: He appears well-developed and well-nourished.  HENT:  Head: Normocephalic and atraumatic.  Eyes: Conjunctivae are normal. Pupils are equal, round, and reactive to light.  Neck: Normal  range of motion. Neck supple.  Cardiovascular: Normal rate and regular rhythm.   Pulmonary/Chest: Effort normal and breath sounds normal.  Abdominal: Soft. Bowel sounds are normal.          Assessment & Plan:  Hypertension stable on current medications no chest pain doing well gout is stable.  We will monitor the effect of his pulse therapy of the statins at this point with a liver and lipid today.

## 2011-09-19 DIAGNOSIS — H02839 Dermatochalasis of unspecified eye, unspecified eyelid: Secondary | ICD-10-CM | POA: Diagnosis not present

## 2011-10-29 DIAGNOSIS — H02839 Dermatochalasis of unspecified eye, unspecified eyelid: Secondary | ICD-10-CM | POA: Diagnosis not present

## 2011-12-04 ENCOUNTER — Other Ambulatory Visit: Payer: Self-pay | Admitting: Internal Medicine

## 2011-12-26 ENCOUNTER — Encounter: Payer: Self-pay | Admitting: Internal Medicine

## 2011-12-26 ENCOUNTER — Ambulatory Visit (INDEPENDENT_AMBULATORY_CARE_PROVIDER_SITE_OTHER): Payer: Medicare Other | Admitting: Internal Medicine

## 2011-12-26 VITALS — BP 130/88 | HR 72 | Temp 98.3°F | Resp 16 | Ht 72.0 in | Wt 208.0 lb

## 2011-12-26 DIAGNOSIS — M109 Gout, unspecified: Secondary | ICD-10-CM | POA: Diagnosis not present

## 2011-12-26 DIAGNOSIS — M545 Low back pain, unspecified: Secondary | ICD-10-CM

## 2011-12-26 DIAGNOSIS — E785 Hyperlipidemia, unspecified: Secondary | ICD-10-CM | POA: Diagnosis not present

## 2011-12-26 DIAGNOSIS — I10 Essential (primary) hypertension: Secondary | ICD-10-CM

## 2011-12-26 MED ORDER — PREDNISONE 20 MG PO TABS: ORAL_TABLET | ORAL | Status: DC

## 2011-12-26 NOTE — Patient Instructions (Signed)
The patient is instructed to continue all medications as prescribed. Schedule followup with check out clerk upon leaving the clinic  

## 2011-12-26 NOTE — Progress Notes (Signed)
Subjective:    Patient ID: [name], male    DOB: [birth year], 78 y.o.   MRN: [id number]  HPI  Patient is a 78 year old male who presents for followup of hyperlipidemia hypertension with a history of gouty arthritis.  Patient states that he has occasional gouty flares when he takes a prednisone Dosepak that it alleviates symptoms almost immediately.  Recent blood work in January there were reviewed with the patient had the best ever lipid profile his liver was stable.  Today we will not draw blood work because of the stability of his results when he presents next time will be for his Medicare wellness examination  Review of Systems  Constitutional: Negative for fever and fatigue.  HENT: Negative for hearing loss, congestion, neck pain and postnasal drip.   Eyes: Negative for discharge, redness and visual disturbance.  Respiratory: Negative for cough, shortness of breath and wheezing.   Cardiovascular: Negative for leg swelling.  Gastrointestinal: Negative for abdominal pain, constipation and abdominal distention.  Genitourinary: Negative for urgency and frequency.  Musculoskeletal: Negative for joint swelling and arthralgias.  Skin: Negative for color change and rash.  Neurological: Negative for weakness and light-headedness.  Hematological: Negative for adenopathy.  Psychiatric/Behavioral: Negative for behavioral problems.       Past Medical History  Diagnosis Date  . ACTINIC KERATOSIS, HEAD 03/23/2008  . ALLERGIC RHINITIS 03/27/2007  . BENIGN PROSTATIC HYPERTROPHY, WITH URINARY OBSTRUCTION 10/16/2007  . CAD 08/18/2007  . CARPAL TUNNEL SYNDROME, LEFT 04/13/2008  . ERECTILE DYSFUNCTION, ORGANIC 02/20/2010  . Gout, unspecified 02/20/2010  . HYPERLIPIDEMIA, WITH HIGH HDL 10/18/2008  . HYPERTENSION 05/09/2007  . KNEE PAIN 10/10/2009  . LOW BACK PAIN 03/27/2007  . NEOPLASM, MALIGNANT, SKIN, TRUNK 05/09/2007  . Patellar tendinitis 10/10/2009  . SKIN CANCER, HX OF 03/27/2007  . UNS ADVRS  EFF UNS RX MEDICINAL&BIOLOGICAL SBSTNC 08/18/2007    History   Social History  . Marital Status: Married    Spouse Name: N/A    Number of Children: N/A  . Years of Education: N/A   Occupational History  . Not on file.   Social History Main Topics  . Smoking status: Former Smoker    Quit date: 09/29/1981  . Smokeless tobacco: Not on file  . Alcohol Use: No  . Drug Use: No  . Sexually Active: Yes   Other Topics Concern  . Not on file   Social History Narrative  . No narrative on file    Past Surgical History  Procedure Date  . Knee arthroscopy   . Appendectomy   . Cholecystectomy   . Nasal sinus surgery   . Vasectomy   . Lumbar fusions   . Joint replacement surgery   . Rectal fissure   . Lateral fusion lumbar spine, transverse   . Laminectomy   . Lumbar fusion   . Rectal fissure     Family History  Problem Relation Age of Onset  . Dementia Mother   . Anemia Father   . Heart disease Father     No Known Allergies  Current Outpatient Prescriptions on File Prior to Visit  Medication Sig Dispense Refill  . diltiazem (CARDIZEM CD) 300 MG 24 hr capsule Take 1 capsule (300 mg total) by mouth daily.  30 capsule  11  . MICARDIS HCT 80-25 MG per tablet TAKE 1 TABLET BY MOUTH DAILY  30 tablet  10  . rosuvastatin (CRESTOR) 20 MG tablet Take 20 mg by mouth once a week.       Marland Kitchen  triamcinolone cream (KENALOG) 0.5 % Apply topically 2 (two) times daily.  60 g  6  . DISCONTD: telmisartan-hydrochlorothiazide (MICARDIS HCT) 80-25 MG per tablet Take 1 tablet by mouth daily.  30 tablet  11    BP 160/90  Pulse 72  Temp 98.3 F (36.8 C)  Resp 16  Ht 6' (1.829 m)  Wt 208 lb (94.348 kg)  BMI 28.21 kg/m2    Objective:   Physical Exam  Nursing note and vitals reviewed. Constitutional: He appears well-developed and well-nourished.  HENT:  Head: Normocephalic and atraumatic.  Eyes: Conjunctivae are normal. Pupils are equal, round, and reactive to light.  Neck: Normal  range of motion. Neck supple.  Cardiovascular: Normal rate and regular rhythm.   Pulmonary/Chest: Effort normal and breath sounds normal.  Abdominal: Soft. Bowel sounds are normal.          Assessment & Plan:  The  pt's blood pressure is elevated at the begining of the exam and reduced to 130/80 Monitoring the lipids last visit the pt has Tolerating the medications Samples of  crestor given

## 2012-01-31 DIAGNOSIS — L570 Actinic keratosis: Secondary | ICD-10-CM | POA: Diagnosis not present

## 2012-01-31 DIAGNOSIS — D485 Neoplasm of uncertain behavior of skin: Secondary | ICD-10-CM | POA: Diagnosis not present

## 2012-03-19 DIAGNOSIS — M19049 Primary osteoarthritis, unspecified hand: Secondary | ICD-10-CM | POA: Diagnosis not present

## 2012-04-04 ENCOUNTER — Other Ambulatory Visit: Payer: Self-pay | Admitting: Internal Medicine

## 2012-04-21 ENCOUNTER — Telehealth: Payer: Self-pay | Admitting: Internal Medicine

## 2012-04-21 NOTE — Telephone Encounter (Signed)
aller: Toivo/Patient; Patient Name: [name]; PCP: Darryll Capers (Adults only); Best Callback Phone Number: [phone].  Pateint has allergy like symptoms since 04/19/12 with sinus congeston, scratching throat, and joint pain in his legs.  He used Afrin Nasal spray this AM.  Afebrile.  Triaged Upper Respriratory Infection and all emergent symptoms ruled out.  Disposition is provide home care for all other situations.   Home care and call back instrucitons given.  Suggested Neti Pot use.

## 2012-04-23 ENCOUNTER — Other Ambulatory Visit: Payer: Self-pay | Admitting: Internal Medicine

## 2012-04-28 ENCOUNTER — Encounter: Payer: Medicare Other | Admitting: Internal Medicine

## 2012-04-28 ENCOUNTER — Ambulatory Visit (INDEPENDENT_AMBULATORY_CARE_PROVIDER_SITE_OTHER): Payer: Medicare Other

## 2012-04-28 DIAGNOSIS — Z23 Encounter for immunization: Secondary | ICD-10-CM | POA: Diagnosis not present

## 2012-04-30 DIAGNOSIS — H43819 Vitreous degeneration, unspecified eye: Secondary | ICD-10-CM | POA: Diagnosis not present

## 2012-04-30 DIAGNOSIS — Z961 Presence of intraocular lens: Secondary | ICD-10-CM | POA: Diagnosis not present

## 2012-04-30 DIAGNOSIS — H04129 Dry eye syndrome of unspecified lacrimal gland: Secondary | ICD-10-CM | POA: Diagnosis not present

## 2012-05-02 DIAGNOSIS — H35079 Retinal telangiectasis, unspecified eye: Secondary | ICD-10-CM | POA: Diagnosis not present

## 2012-05-02 DIAGNOSIS — H43819 Vitreous degeneration, unspecified eye: Secondary | ICD-10-CM | POA: Diagnosis not present

## 2012-05-02 DIAGNOSIS — H43399 Other vitreous opacities, unspecified eye: Secondary | ICD-10-CM | POA: Diagnosis not present

## 2012-05-11 ENCOUNTER — Encounter: Payer: Self-pay | Admitting: Internal Medicine

## 2012-05-11 NOTE — Patient Instructions (Signed)
You will be referred to dermatology

## 2012-05-11 NOTE — Progress Notes (Signed)
  Subjective:    Patient ID: [name], male    DOB: January 30, 1932, 78 y.o.   MRN: [id number]  HPI  Patient is a 78 year old male followed for hypertension and hyperlipidemia.  He also has a history of gout and states that he has had only one gouty episode since last office visit.  Is also followed for left carpal tunnel syndrome and he has a history of benign prostatic hypertrophy  Review of Systems  Constitutional: Positive for fatigue.  Eyes: Negative.   Respiratory: Negative.   Cardiovascular: Negative.   Genitourinary: Positive for urgency, frequency and decreased urine volume.       Objective:   Physical Exam  Nursing note and vitals reviewed. Constitutional: He appears well-developed and well-nourished.  HENT:  Head: Normocephalic and atraumatic.  Cardiovascular: Normal rate and regular rhythm.   Murmur heard. Pulmonary/Chest: Effort normal and breath sounds normal.  Abdominal: Soft. Bowel sounds are normal.          Assessment & Plan:  Stable hypertension on Micardis and Cardizem with regular rate today Stable lipid control on Crestor.  Multiple actinic keratosis refer to dermatology for skin check due to history of skin cancer I suspect he has a lesion that is consistent with basal cell carcinoma

## 2012-05-21 DIAGNOSIS — H43399 Other vitreous opacities, unspecified eye: Secondary | ICD-10-CM | POA: Diagnosis not present

## 2012-05-21 DIAGNOSIS — H53459 Other localized visual field defect, unspecified eye: Secondary | ICD-10-CM | POA: Diagnosis not present

## 2012-06-25 DIAGNOSIS — H43399 Other vitreous opacities, unspecified eye: Secondary | ICD-10-CM | POA: Diagnosis not present

## 2012-06-25 DIAGNOSIS — H5460 Unqualified visual loss, one eye, unspecified: Secondary | ICD-10-CM | POA: Diagnosis not present

## 2012-06-30 ENCOUNTER — Other Ambulatory Visit: Payer: Self-pay | Admitting: Internal Medicine

## 2012-07-02 ENCOUNTER — Other Ambulatory Visit: Payer: Self-pay | Admitting: Internal Medicine

## 2012-07-11 ENCOUNTER — Encounter: Payer: Medicare Other | Admitting: Internal Medicine

## 2012-07-18 ENCOUNTER — Telehealth: Payer: Self-pay | Admitting: Internal Medicine

## 2012-07-18 ENCOUNTER — Ambulatory Visit (INDEPENDENT_AMBULATORY_CARE_PROVIDER_SITE_OTHER): Payer: Medicare Other | Admitting: Internal Medicine

## 2012-07-18 ENCOUNTER — Encounter: Payer: Self-pay | Admitting: Internal Medicine

## 2012-07-18 VITALS — BP 132/72 | Wt 198.0 lb

## 2012-07-18 DIAGNOSIS — M25529 Pain in unspecified elbow: Secondary | ICD-10-CM | POA: Diagnosis not present

## 2012-07-18 DIAGNOSIS — M25521 Pain in right elbow: Secondary | ICD-10-CM

## 2012-07-18 MED ORDER — DICLOFENAC SODIUM 1 % TD GEL: 2.0000 g | Freq: Four times a day (QID) | TRANSDERMAL | Status: DC

## 2012-07-18 NOTE — Patient Instructions (Addendum)
Our office will contact you re: referral to Dr. Teressa Senter Follow up with Dr Lovell Sheehan as planned on 08/12/2012

## 2012-07-18 NOTE — Telephone Encounter (Signed)
Patient Information:  Caller Name: Nasiah  Phone: [phone]  Patient: [name], [name]  Gender: Male  DOB: Jul 25, 1932  Age: 78 Years  PCP: Darryll Capers (Adults only)   Symptoms  Reason For Call & Symptoms: Right elbow pain  Reviewed Health History In EMR: Yes  Reviewed Medications In EMR: Yes  Reviewed Allergies In EMR: Yes  Reviewed Surgeries / Procedures: Yes  Date of Onset of Symptoms: 06/27/2012  Treatments Tried: Aspirin  Treatments Tried Worked: No  Guideline(s) Used:  Elbow Pain  Disposition Per Guideline:   See Within 3 Days in Office  Reason For Disposition Reached:   Moderate pain (e.g., interferes with normal activities) and present > 3 days  Advice Given:  N/A  Office Follow Up:  Does the office need to follow up with this patient?: Yes  Instructions For The Office: Requesting appt for today at 1600.  No appts available.  Please f/u regarding appt need at [phone].  RN Note:  Right elbow pain, interfering with sleep.  Intermittent tingling of right hand associated with the pain.

## 2012-07-18 NOTE — Telephone Encounter (Signed)
Appointment given.

## 2012-07-18 NOTE — Assessment & Plan Note (Signed)
78 year old white male complains of 3 weeks of aching right elbow. He also has tingling down his forearm and hand. He has tenderness near ulnar nerve of his elbow. Question ulnar compressive neuropathy. I would like to avoid oral NSAIDs due to his history of hypertension. Use Voltaren gel 2 g 34 times a day. Refer to orthopedic specialist for further evaluation and treatment.

## 2012-07-18 NOTE — Progress Notes (Signed)
Subjective:    Patient ID: [name], male    DOB: 09-24-1931, 78 y.o.   MRN: [id number]  HPI  78 year old white male with history of hypertension, gout, and coronary artery disease Complains of right elbow pain for the last 2 weeks. He denies any specific injury or trauma. He denies any swelling or redness. He describes aching sensation with tingling down his forearm. He has difficulty sleeping due to right elbow discomfort.  He has history of right carpal tunnel syndrome and had carpal tunnel release by Dr. Teressa Senter in the past.   Review of Systems Negative for fever or chills  Past Medical History  Diagnosis Date  . ACTINIC KERATOSIS, HEAD 03/23/2008  . ALLERGIC RHINITIS 03/27/2007  . BENIGN PROSTATIC HYPERTROPHY, WITH URINARY OBSTRUCTION 10/16/2007  . CAD 08/18/2007  . CARPAL TUNNEL SYNDROME, LEFT 04/13/2008  . ERECTILE DYSFUNCTION, ORGANIC 02/20/2010  . Gout, unspecified 02/20/2010  . HYPERLIPIDEMIA, WITH HIGH HDL 10/18/2008  . HYPERTENSION 05/09/2007  . KNEE PAIN 10/10/2009  . LOW BACK PAIN 03/27/2007  . NEOPLASM, MALIGNANT, SKIN, TRUNK 05/09/2007  . Patellar tendinitis 10/10/2009  . SKIN CANCER, HX OF 03/27/2007  . UNS ADVRS EFF UNS RX MEDICINAL&BIOLOGICAL SBSTNC 08/18/2007    History   Social History  . Marital Status: Married    Spouse Name: N/A    Number of Children: N/A  . Years of Education: N/A   Occupational History  . Not on file.   Social History Main Topics  . Smoking status: Former Smoker    Quit date: 09/29/1981  . Smokeless tobacco: Not on file  . Alcohol Use: No  . Drug Use: No  . Sexually Active: Yes   Other Topics Concern  . Not on file   Social History Narrative  . No narrative on file    Past Surgical History  Procedure Date  . Knee arthroscopy   . Appendectomy   . Cholecystectomy   . Nasal sinus surgery   . Vasectomy   . Lumbar fusions   . Joint replacement surgery   . Rectal fissure   . Lateral fusion lumbar spine, transverse   .  Laminectomy   . Lumbar fusion   . Rectal fissure     Family History  Problem Relation Age of Onset  . Dementia Mother   . Anemia Father   . Heart disease Father     No Known Allergies  Current Outpatient Prescriptions on File Prior to Visit  Medication Sig Dispense Refill  . diltiazem (CARDIZEM CD) 300 MG 24 hr capsule EVERY DAY  90 capsule  0  . MICARDIS HCT 80-25 MG per tablet TAKE 1 TABLET EVERY DAY  90 tablet  0  . predniSONE (DELTASONE) 20 MG tablet TAKE TWO FOR 2 DAYS THEN ONE FOR 4 DAYS THEN 1/2 FOR 4 DAYS  10 tablet  1  . rosuvastatin (CRESTOR) 20 MG tablet Take 20 mg by mouth once a week.       . triamcinolone cream (KENALOG) 0.5 % Apply topically 2 (two) times daily.  60 g  6    BP 132/72  Wt 198 lb (89.812 kg)       Objective:   Physical Exam  Constitutional: He appears well-developed and well-nourished. No distress.  Cardiovascular: Normal rate, regular rhythm and normal heart sounds.   Pulmonary/Chest: Effort normal and breath sounds normal. He has no wheezes.  Musculoskeletal:       Slight atrophy muscles of right hand.  Tenderness near ulnar groove  of right elbow          Assessment & Plan:

## 2012-07-21 ENCOUNTER — Other Ambulatory Visit: Payer: Self-pay | Admitting: Internal Medicine

## 2012-07-23 DIAGNOSIS — M109 Gout, unspecified: Secondary | ICD-10-CM | POA: Diagnosis not present

## 2012-07-23 DIAGNOSIS — G5620 Lesion of ulnar nerve, unspecified upper limb: Secondary | ICD-10-CM | POA: Diagnosis not present

## 2012-07-31 DIAGNOSIS — L570 Actinic keratosis: Secondary | ICD-10-CM | POA: Diagnosis not present

## 2012-07-31 DIAGNOSIS — D485 Neoplasm of uncertain behavior of skin: Secondary | ICD-10-CM | POA: Diagnosis not present

## 2012-07-31 DIAGNOSIS — Z85820 Personal history of malignant melanoma of skin: Secondary | ICD-10-CM | POA: Diagnosis not present

## 2012-07-31 DIAGNOSIS — D235 Other benign neoplasm of skin of trunk: Secondary | ICD-10-CM | POA: Diagnosis not present

## 2012-08-12 ENCOUNTER — Encounter: Payer: Self-pay | Admitting: Internal Medicine

## 2012-08-12 ENCOUNTER — Ambulatory Visit (INDEPENDENT_AMBULATORY_CARE_PROVIDER_SITE_OTHER): Payer: Medicare Other | Admitting: Internal Medicine

## 2012-08-12 VITALS — BP 124/80 | HR 72 | Temp 98.6°F | Resp 16 | Ht 72.0 in | Wt 200.0 lb

## 2012-08-12 DIAGNOSIS — N401 Enlarged prostate with lower urinary tract symptoms: Secondary | ICD-10-CM | POA: Diagnosis not present

## 2012-08-12 DIAGNOSIS — N139 Obstructive and reflux uropathy, unspecified: Secondary | ICD-10-CM

## 2012-08-12 DIAGNOSIS — N138 Other obstructive and reflux uropathy: Secondary | ICD-10-CM

## 2012-08-12 DIAGNOSIS — T887XXA Unspecified adverse effect of drug or medicament, initial encounter: Secondary | ICD-10-CM | POA: Diagnosis not present

## 2012-08-12 DIAGNOSIS — Z0000 Encounter for general adult medical examination without abnormal findings: Secondary | ICD-10-CM

## 2012-08-12 DIAGNOSIS — I10 Essential (primary) hypertension: Secondary | ICD-10-CM

## 2012-08-12 DIAGNOSIS — E785 Hyperlipidemia, unspecified: Secondary | ICD-10-CM

## 2012-08-12 DIAGNOSIS — M1A00X0 Idiopathic chronic gout, unspecified site, without tophus (tophi): Secondary | ICD-10-CM | POA: Diagnosis not present

## 2012-08-12 LAB — POCT URINALYSIS DIPSTICK
Bilirubin, UA: NEGATIVE
Blood, UA: NEGATIVE
Glucose, UA: NEGATIVE
Ketones, UA: NEGATIVE
Leukocytes, UA: NEGATIVE
Nitrite, UA: NEGATIVE
Protein, UA: NEGATIVE
Spec Grav, UA: 1.02
Urobilinogen, UA: 0.2
pH, UA: 6.5

## 2012-08-12 LAB — LIPID PANEL
Cholesterol: 181 mg/dL (ref 0–200)
HDL: 65.8 mg/dL (ref >39.00)
LDL Cholesterol: 99 mg/dL (ref 0–99)
Total CHOL/HDL Ratio: 3
Triglycerides: 81 mg/dL (ref 0.0–149.0)
VLDL: 16.2 mg/dL (ref 0.0–40.0)

## 2012-08-12 LAB — CBC WITH DIFFERENTIAL/PLATELET
Basophils Absolute: 0.1 10*3/uL (ref 0.0–0.1)
Basophils Relative: 0.5 % (ref 0.0–3.0)
Eosinophils Absolute: 0.3 10*3/uL (ref 0.0–0.7)
Eosinophils Relative: 2.2 % (ref 0.0–5.0)
HCT: 44.8 % (ref 39.0–52.0)
Hemoglobin: 14.9 g/dL (ref 13.0–17.0)
Lymphocytes Relative: 26.3 % (ref 12.0–46.0)
Lymphs Abs: 3.1 10*3/uL (ref 0.7–4.0)
MCHC: 33.4 g/dL (ref 30.0–36.0)
MCV: 88.4 fl (ref 78.0–100.0)
Monocytes Absolute: 0.8 10*3/uL (ref 0.1–1.0)
Monocytes Relative: 7 % (ref 3.0–12.0)
Neutro Abs: 7.5 10*3/uL (ref 1.4–7.7)
Neutrophils Relative %: 64 % (ref 43.0–77.0)
Platelets: 451 10*3/uL — ABNORMAL HIGH (ref 150.0–400.0)
RBC: 5.07 Mil/uL (ref 4.22–5.81)
RDW: 14.2 % (ref 11.5–14.6)
WBC: 11.7 10*3/uL — ABNORMAL HIGH (ref 4.5–10.5)

## 2012-08-12 LAB — BASIC METABOLIC PANEL
BUN: 21 mg/dL (ref 6–23)
CO2: 27 mEq/L (ref 19–32)
Calcium: 9.1 mg/dL (ref 8.4–10.5)
Chloride: 102 mEq/L (ref 96–112)
Creatinine, Ser: 1.2 mg/dL (ref 0.4–1.5)
GFR: 63.92 mL/min (ref >60.00)
Glucose, Bld: 86 mg/dL (ref 70–99)
Potassium: 4.1 mEq/L (ref 3.5–5.1)
Sodium: 138 mEq/L (ref 135–145)

## 2012-08-12 LAB — HEPATIC FUNCTION PANEL
ALT: 23 U/L (ref 0–53)
AST: 18 U/L (ref 0–37)
Albumin: 3.9 g/dL (ref 3.5–5.2)
Alkaline Phosphatase: 61 U/L (ref 39–117)
Bilirubin, Direct: 0.1 mg/dL (ref 0.0–0.3)
Total Bilirubin: 1.2 mg/dL (ref 0.3–1.2)
Total Protein: 6.6 g/dL (ref 6.0–8.3)

## 2012-08-12 LAB — PSA: PSA: 1.02 ng/mL (ref 0.10–4.00)

## 2012-08-12 LAB — TSH: TSH: 1.34 u[IU]/mL (ref 0.35–5.50)

## 2012-08-12 LAB — URIC ACID: Uric Acid, Serum: 7.5 mg/dL (ref 4.0–7.8)

## 2012-08-12 MED ORDER — TELMISARTAN 80 MG PO TABS: 80.0000 mg | ORAL_TABLET | Freq: Every day | ORAL | Status: DC

## 2012-08-12 NOTE — Patient Instructions (Addendum)
Stop the Micardis with a diuretic and takes only the 80 mg of plain Micardis

## 2012-08-12 NOTE — Progress Notes (Signed)
Subjective:    Patient ID: [name], male    DOB: [birth year], 78 y.o.   MRN: [id number]  HPI Hx of gout in foot and now with question of gout in elbow. HTN with hx of CAD Hx of BPH with UO Hx of Low back pain and knee pain Hx of neuropathy     Review of Systems  Constitutional: Negative for fever and fatigue.  HENT: Negative for hearing loss, congestion, neck pain and postnasal drip.   Eyes: Positive for pain. Negative for discharge, redness and visual disturbance.  Respiratory: Negative for cough, shortness of breath and wheezing.   Cardiovascular: Negative for leg swelling.  Gastrointestinal: Negative for abdominal pain, constipation and abdominal distention.  Genitourinary: Negative for urgency and frequency.  Musculoskeletal: Positive for myalgias, back pain and joint swelling. Negative for arthralgias.  Skin: Negative for color change and rash.  Neurological: Positive for numbness. Negative for weakness and light-headedness.  Hematological: Negative for adenopathy.  Psychiatric/Behavioral: Negative for behavioral problems.   Past Medical History  Diagnosis Date  . ACTINIC KERATOSIS, HEAD 03/23/2008  . ALLERGIC RHINITIS 03/27/2007  . BENIGN PROSTATIC HYPERTROPHY, WITH URINARY OBSTRUCTION 10/16/2007  . CAD 08/18/2007  . CARPAL TUNNEL SYNDROME, LEFT 04/13/2008  . ERECTILE DYSFUNCTION, ORGANIC 02/20/2010  . Gout, unspecified 02/20/2010  . HYPERLIPIDEMIA, WITH HIGH HDL 10/18/2008  . HYPERTENSION 05/09/2007  . KNEE PAIN 10/10/2009  . LOW BACK PAIN 03/27/2007  . NEOPLASM, MALIGNANT, SKIN, TRUNK 05/09/2007  . Patellar tendinitis 10/10/2009  . SKIN CANCER, HX OF 03/27/2007  . UNS ADVRS EFF UNS RX MEDICINAL&BIOLOGICAL SBSTNC 08/18/2007    History   Social History  . Marital Status: Married    Spouse Name: N/A    Number of Children: N/A  . Years of Education: N/A   Occupational History  . Not on file.   Social History Main Topics  . Smoking status: Former Smoker    Quit date:  09/29/1981  . Smokeless tobacco: Not on file  . Alcohol Use: No  . Drug Use: No  . Sexually Active: Yes   Other Topics Concern  . Not on file   Social History Narrative  . No narrative on file    Past Surgical History  Procedure Date  . Knee arthroscopy   . Appendectomy   . Cholecystectomy   . Nasal sinus surgery   . Vasectomy   . Lumbar fusions   . Joint replacement surgery   . Rectal fissure   . Lateral fusion lumbar spine, transverse   . Laminectomy   . Lumbar fusion   . Rectal fissure   . Vitrectomy     right and left 2013    Family History  Problem Relation Age of Onset  . Dementia Mother   . Anemia Father   . Heart disease Father     No Known Allergies  Current Outpatient Prescriptions on File Prior to Visit  Medication Sig Dispense Refill  . diclofenac sodium (VOLTAREN) 1 % GEL Apply 2 g topically 4 (four) times daily.  2 Tube  1  . diltiazem (CARDIZEM CD) 300 MG 24 hr capsule EVERY DAY  90 capsule  0  . MICARDIS HCT 80-25 MG per tablet TAKE 1 TABLET EVERY DAY  90 tablet  0  . rosuvastatin (CRESTOR) 20 MG tablet Take 20 mg by mouth once a week.       . triamcinolone cream (KENALOG) 0.5 % Apply topically 2 (two) times daily.  60 g  6  BP 124/80  Pulse 72  Temp 98.6 F (37 C)  Resp 16  Ht 6' (1.829 m)  Wt 200 lb (90.719 kg)  BMI 27.12 kg/m2       Objective:   Physical Exam  Nursing note and vitals reviewed. Constitutional: He is oriented to person, place, and time. He appears well-developed and well-nourished.  HENT:  Head: Normocephalic and atraumatic.  Eyes: Conjunctivae normal are normal. Pupils are equal, round, and reactive to light.  Neck: Normal range of motion. Neck supple.  Cardiovascular: Normal rate and regular rhythm.   Pulmonary/Chest: Effort normal and breath sounds normal.  Abdominal: Soft. Bowel sounds are normal.  Genitourinary:       enlarged prostate  Musculoskeletal: He exhibits edema and tenderness.  Neurological:  He is alert and oriented to person, place, and time.  Skin: Skin is warm and dry.  Psychiatric: He has a normal mood and affect. His behavior is normal.          Assessment & Plan:  Stable HTN and CAD discussion of gout and ETOH Mild persistant neuropathy Increased frequency on 25 grams of hydrochlorothiazide we'll try Micardis 80 mg 1 by mouth daily  Subjective:    [name] is a 78 y.o. male who presents for Medicare Annual/Subsequent preventive examination.   Preventive Screening-Counseling & Management  Tobacco History  Smoking status  . Former Smoker  . Quit date: 09/29/1981  Smokeless tobacco  . Not on file    Problems Prior to Visit 1.   Current Problems (verified) Patient Active Problem List  Diagnosis  . NEOPLASM, MALIGNANT, SKIN, TRUNK  . HYPERLIPIDEMIA, WITH HIGH HDL  . GOUT, UNSPECIFIED  . CARPAL TUNNEL SYNDROME, LEFT  . HYPERTENSION  . CAD  . ALLERGIC RHINITIS  . BENIGN PROSTATIC HYPERTROPHY, WITH URINARY OBSTRUCTION  . ERECTILE DYSFUNCTION, ORGANIC  . ACTINIC KERATOSIS, HEAD  . KNEE PAIN  . LOW BACK PAIN  . PATELLAR TENDINITIS  . UNS ADVRS EFF UNS RX MEDICINAL&BIOLOGICAL SBSTNC  . SKIN CANCER, HX OF  . Right elbow pain    Medications Prior to Visit Current Outpatient Prescriptions on File Prior to Visit  Medication Sig Dispense Refill  . diclofenac sodium (VOLTAREN) 1 % GEL Apply 2 g topically 4 (four) times daily.  2 Tube  1  . diltiazem (CARDIZEM CD) 300 MG 24 hr capsule EVERY DAY  90 capsule  0  . rosuvastatin (CRESTOR) 20 MG tablet Take 20 mg by mouth once a week.       . triamcinolone cream (KENALOG) 0.5 % Apply topically 2 (two) times daily.  60 g  6  . telmisartan (MICARDIS) 80 MG tablet Take 1 tablet (80 mg total) by mouth daily.        Current Medications (verified) Current Outpatient Prescriptions  Medication Sig Dispense Refill  . diclofenac sodium (VOLTAREN) 1 % GEL Apply 2 g topically 4 (four) times daily.  2 Tube  1    . diltiazem (CARDIZEM CD) 300 MG 24 hr capsule EVERY DAY  90 capsule  0  . rosuvastatin (CRESTOR) 20 MG tablet Take 20 mg by mouth once a week.       . triamcinolone cream (KENALOG) 0.5 % Apply topically 2 (two) times daily.  60 g  6  . telmisartan (MICARDIS) 80 MG tablet Take 1 tablet (80 mg total) by mouth daily.         Allergies (verified) Review of patient's allergies indicates no known allergies.   PAST HISTORY  Family History Family History  Problem Relation Age of Onset  . Dementia Mother   . Anemia Father   . Heart disease Father     Social History History  Substance Use Topics  . Smoking status: Former Smoker    Quit date: 09/29/1981  . Smokeless tobacco: Not on file  . Alcohol Use: No    Are there smokers in your home (other than you)?  No  Risk Factors Current exercise habits: The patient does not participate in regular exercise at present.  Dietary issues discussed: none   Cardiac risk factors: advanced age (older than 35 for men, 28 for women), dyslipidemia, family history of premature cardiovascular disease and hypertension.  Depression Screen (Note: if answer to either of the following is "Yes", a more complete depression screening is indicated)   Q1: Over the past two weeks, have you felt down, depressed or hopeless? No  Q2: Over the past two weeks, have you felt little interest or pleasure in doing things? No  Have you lost interest or pleasure in daily life? No  Do you often feel hopeless? No  Do you cry easily over simple problems? No  Activities of Daily Living In your present state of health, do you have any difficulty performing the following activities?:  Driving? No Managing money?  No Feeding yourself? No Getting from bed to chair? No Climbing a flight of stairs? No Preparing food and eating?: No Bathing or showering? No Getting dressed: No Getting to the toilet? No Using the toilet:No Moving around from place to place: No In the  past year have you fallen or had a near fall?:No   Are you sexually active?  Yes  Do you have more than one partner?  No  Hearing Difficulties: No Do you often ask people to speak up or repeat themselves? No Do you experience ringing or noises in your ears? No Do you have difficulty understanding soft or whispered voices? No   Do you feel that you have a problem with memory? No  Do you often misplace items? No  Do you feel safe at home?  No  Cognitive Testing  Alert? Yes  Normal Appearance?Yes  Oriented to person? Yes  Place? Yes   Time? Yes  Recall of three objects?  Yes  Can perform simple calculations? Yes  Displays appropriate judgment?Yes  Can read the correct time from a watch face?Yes   Advanced Directives have been discussed with the patient? Yes   List the Names of Other Physician/Practitioners you currently use: 1.    Indicate any recent Medical Services you may have received from other than [facility] providers in the past year (date may be approximate).  Immunization History  Administered Date(s) Administered  . Influenza Split 06/04/2011, 04/28/2012  . Influenza Whole 06/13/1998, 06/05/2007, 05/18/2008, 05/23/2009, 05/22/2010  . Pneumococcal Polysaccharide 08/13/2006  . Td 08/13/2006    Screening Tests Health Maintenance  Topic Date Due  . Colonoscopy  08/20/1983  . Zostavax  08/19/1993  . Influenza Vaccine  04/13/2013  . Tetanus/tdap  08/13/2016  . Pneumococcal Polysaccharide Vaccine Age 11 And Over  Completed    All answers were reviewed with the patient and necessary referrals were made:  Carrie Mew, MD   08/12/2012   History reviewed: allergies, current medications, past family history, past medical history, past social history, past surgical history and problem list  Review of Systems Review of systems not obtained due to patient factors.    Objective:  Vision by Snellen chart: right eye:20/20, left eye:20/20 Blood pressure 124/80, pulse  72, temperature 98.6 F (37 C), resp. rate 16, height 6' (1.829 m), weight 200 lb (90.719 kg). Body mass index is 27.12 kg/(m^2).  No exam performed today, the exam was a part of the problem focused exam.     Assessment:      Patient presents for yearly preventative medicine examination.   all immunizations and health maintenance protocols were reviewed with the patient and they are up to date with these protocols.   screening laboratory values were reviewed with the patient including screening of hyperlipidemia PSA renal function and hepatic function.   There medications past medical history social history problem list and allergies were reviewed in detail.   Goals were established with regard to weight loss exercise diet in compliance with medications      Plan:     During the course of the visit the patient was educated and counseled about appropriate screening and preventive services including:    Td vaccine  Prostate cancer screening  Diet review for nutrition referral? Yes ____  Not Indicated _x___   Patient Instructions (the written plan) was given to the patient.  Medicare Attestation I have personally reviewed: The patient's medical and social history Their use of alcohol, tobacco or illicit drugs Their current medications and supplements The patient's functional ability including ADLs,fall risks, home safety risks, cognitive, and hearing and visual impairment Diet and physical activities Evidence for depression or mood disorders  The patient's weight, height, BMI, and visual acuity have been recorded in the chart.  I have made referrals, counseling, and provided education to the patient based on review of the above and I have provided the patient with a written personalized care plan for preventive services.     Carrie Mew, MD   08/12/2012

## 2012-08-22 DIAGNOSIS — M47812 Spondylosis without myelopathy or radiculopathy, cervical region: Secondary | ICD-10-CM | POA: Diagnosis not present

## 2012-09-08 ENCOUNTER — Other Ambulatory Visit: Payer: Self-pay | Admitting: Internal Medicine

## 2012-09-09 DIAGNOSIS — M47812 Spondylosis without myelopathy or radiculopathy, cervical region: Secondary | ICD-10-CM | POA: Diagnosis not present

## 2012-09-09 DIAGNOSIS — M5412 Radiculopathy, cervical region: Secondary | ICD-10-CM | POA: Diagnosis not present

## 2012-09-09 DIAGNOSIS — M5030 Other cervical disc degeneration, unspecified cervical region: Secondary | ICD-10-CM | POA: Diagnosis not present

## 2012-09-09 DIAGNOSIS — M542 Cervicalgia: Secondary | ICD-10-CM | POA: Diagnosis not present

## 2012-09-11 ENCOUNTER — Other Ambulatory Visit: Payer: Self-pay | Admitting: Neurosurgery

## 2012-09-11 DIAGNOSIS — M5412 Radiculopathy, cervical region: Secondary | ICD-10-CM

## 2012-09-11 DIAGNOSIS — M47812 Spondylosis without myelopathy or radiculopathy, cervical region: Secondary | ICD-10-CM

## 2012-09-11 DIAGNOSIS — M5030 Other cervical disc degeneration, unspecified cervical region: Secondary | ICD-10-CM

## 2012-09-11 DIAGNOSIS — M542 Cervicalgia: Secondary | ICD-10-CM

## 2012-09-15 ENCOUNTER — Encounter: Payer: Self-pay | Admitting: Internal Medicine

## 2012-09-15 ENCOUNTER — Telehealth: Payer: Self-pay | Admitting: Internal Medicine

## 2012-09-15 ENCOUNTER — Ambulatory Visit (INDEPENDENT_AMBULATORY_CARE_PROVIDER_SITE_OTHER): Payer: Medicare Other | Admitting: Internal Medicine

## 2012-09-15 VITALS — BP 160/90 | HR 72 | Temp 98.1°F | Resp 20 | Wt 202.0 lb

## 2012-09-15 DIAGNOSIS — M5380 Other specified dorsopathies, site unspecified: Secondary | ICD-10-CM

## 2012-09-15 DIAGNOSIS — M5410 Radiculopathy, site unspecified: Secondary | ICD-10-CM

## 2012-09-15 DIAGNOSIS — M62830 Muscle spasm of back: Secondary | ICD-10-CM

## 2012-09-15 DIAGNOSIS — IMO0002 Reserved for concepts with insufficient information to code with codable children: Secondary | ICD-10-CM

## 2012-09-15 MED ORDER — ORPHENADRINE CITRATE ER 100 MG PO TB12: 100.0000 mg | ORAL_TABLET | Freq: Two times a day (BID) | ORAL | Status: DC

## 2012-09-15 MED ORDER — HYDROCODONE-ACETAMINOPHEN 10-325 MG PO TABS: 1.0000 | ORAL_TABLET | Freq: Three times a day (TID) | ORAL | Status: DC | PRN

## 2012-09-15 NOTE — Telephone Encounter (Signed)
Patient Information:  Caller Name: Iam  Phone: [phone]  Patient: [name], [name]  Gender: Male  DOB: 06-Mar-1932  Age: 79 Years  PCP: Darryll Capers (Adults only)  Office Follow Up:  Does the office need to follow up with this patient?: No  Instructions For The Office: N/A   Symptoms  Reason For Call & Symptoms: Pain, weakness, numbness  in right arm/ neck - ongoing..  States he is having MRI in 09/17/12.  Pain in left leg from waist to calf; onset unknown.  It is more severe for 2 weeks.  Pain rated at 8 of 10 constantly.  Emergent symptoms ruled out.  Home care and see provider within 24 hours per Back Pain protocol.  Reviewed Health History In EMR: Yes  Reviewed Medications In EMR: Yes  Reviewed Allergies In EMR: Yes  Reviewed Surgeries / Procedures: Yes  Date of Onset of Symptoms: Unknown  Treatments Tried: Pain meds, Seen Neurologist and Neurosurgeon.  3 Aspirn q 12 hours without relief.  Treatments Tried Worked: No  Guideline(s) Used:  Back Pain  Disposition Per Guideline:   See Today or Tomorrow in Office  Reason For Disposition Reached:   Pain radiates into the thigh or further down the leg  Advice Given:  N/A  Appointment Scheduled:  09/15/2012 13:45:00 Appointment Scheduled Provider:  Darryll Capers (Adults only)

## 2012-09-15 NOTE — Progress Notes (Signed)
Subjective:    Patient ID: [name], male    DOB: 05-Jan-1932, 79 y.o.   MRN: [id number]  HPI The patient has cervical radiculopathy The pain in the patients shoulder and arm are felt to be radicular MRI scheduled for 2/5 2014 Patients pain is still 8/10 and blood pressure is elevated He is not on pain medications currently Arm is now numb Back pain in left buttock down the left leg confirming radicular pain       Review of Systems  Constitutional: Negative for fever and fatigue.  HENT: Negative for hearing loss, congestion, neck pain and postnasal drip.   Eyes: Negative for discharge, redness and visual disturbance.  Respiratory: Negative for cough, shortness of breath and wheezing.   Cardiovascular: Negative for leg swelling.  Gastrointestinal: Negative for abdominal pain, constipation and abdominal distention.  Genitourinary: Negative for urgency and frequency.  Musculoskeletal: Positive for myalgias, back pain and joint swelling. Negative for arthralgias.  Skin: Negative for color change and rash.  Neurological: Negative for weakness and light-headedness.  Hematological: Negative for adenopathy.  Psychiatric/Behavioral: Negative for behavioral problems.   Past Medical History  Diagnosis Date  . ACTINIC KERATOSIS, HEAD 03/23/2008  . ALLERGIC RHINITIS 03/27/2007  . BENIGN PROSTATIC HYPERTROPHY, WITH URINARY OBSTRUCTION 10/16/2007  . CAD 08/18/2007  . CARPAL TUNNEL SYNDROME, LEFT 04/13/2008  . ERECTILE DYSFUNCTION, ORGANIC 02/20/2010  . Gout, unspecified 02/20/2010  . HYPERLIPIDEMIA, WITH HIGH HDL 10/18/2008  . HYPERTENSION 05/09/2007  . KNEE PAIN 10/10/2009  . LOW BACK PAIN 03/27/2007  . NEOPLASM, MALIGNANT, SKIN, TRUNK 05/09/2007  . Patellar tendinitis 10/10/2009  . SKIN CANCER, HX OF 03/27/2007  . UNS ADVRS EFF UNS RX MEDICINAL&BIOLOGICAL SBSTNC 08/18/2007    History   Social History  . Marital Status: Married    Spouse Name: N/A    Number of Children: N/A  . Years of  Education: N/A   Occupational History  . Not on file.   Social History Main Topics  . Smoking status: Former Smoker    Quit date: 09/29/1981  . Smokeless tobacco: Not on file  . Alcohol Use: No  . Drug Use: No  . Sexually Active: Yes   Other Topics Concern  . Not on file   Social History Narrative  . No narrative on file    Past Surgical History  Procedure Date  . Knee arthroscopy   . Appendectomy   . Cholecystectomy   . Nasal sinus surgery   . Vasectomy   . Lumbar fusions   . Joint replacement surgery   . Rectal fissure   . Lateral fusion lumbar spine, transverse   . Laminectomy   . Lumbar fusion   . Rectal fissure   . Vitrectomy     right and left 2013    Family History  Problem Relation Age of Onset  . Dementia Mother   . Anemia Father   . Heart disease Father     No Known Allergies  Current Outpatient Prescriptions on File Prior to Visit  Medication Sig Dispense Refill  . diclofenac sodium (VOLTAREN) 1 % GEL Apply 2 g topically 4 (four) times daily.  2 Tube  1  . diltiazem (CARDIZEM CD) 300 MG 24 hr capsule EVERY DAY  90 capsule  0  . rosuvastatin (CRESTOR) 20 MG tablet Take 20 mg by mouth once a week.       . telmisartan (MICARDIS) 80 MG tablet Take 1 tablet (80 mg total) by mouth daily.      Marland Kitchen  triamcinolone cream (KENALOG) 0.5 % Apply topically 2 (two) times daily.  60 g  6  . predniSONE (DELTASONE) 20 MG tablet TAKE 2 TABS FOR 2 DAYS THEN 1 TAB FOR 4 DAYS THEN 1/2 TAB FOR 4 DAS  10 tablet  1    BP 160/90  Pulse 72  Temp 98.1 F (36.7 C) (Oral)  Resp 20  Wt 202 lb (91.627 kg)  SpO2 97%       Objective:   Physical Exam  Nursing note and vitals reviewed. Constitutional: He appears well-developed and well-nourished.  HENT:  Head: Normocephalic and atraumatic.  Eyes: Conjunctivae normal are normal. Pupils are equal, round, and reactive to light.  Neck: Normal range of motion. Neck supple.  Cardiovascular: Normal rate and regular rhythm.    Pulmonary/Chest: Effort normal and breath sounds normal.  Abdominal: Soft. Bowel sounds are normal.  Musculoskeletal: He exhibits edema and tenderness.  Neurological: He displays abnormal reflex. He exhibits abnormal muscle tone. Coordination abnormal.       Sensory deficit right upper extremity  Skin: Skin is warm and dry.          Assessment & Plan:  Radicular neck pain that has progressed to numbness on the right side suggesting foraminal encroachment. Followed by neurosurgery Now with new severe pain radiating into the left buttock going down the left leg suggesting L5 radiculopathy in the low back  norco 10 and norflex for back until the neurosurgeon helping with the pain

## 2012-09-15 NOTE — Patient Instructions (Signed)
The patient is instructed to continue all medications as prescribed. Schedule followup with check out clerk upon leaving the clinic  

## 2012-09-17 ENCOUNTER — Ambulatory Visit
Admission: RE | Admit: 2012-09-17 | Discharge: 2012-09-17 | Disposition: A | Discharge status: Home / Self Care | Payer: Medicare Other | Source: Ambulatory Visit | Attending: Neurosurgery | Admitting: Neurosurgery

## 2012-09-17 DIAGNOSIS — M47812 Spondylosis without myelopathy or radiculopathy, cervical region: Secondary | ICD-10-CM

## 2012-09-17 DIAGNOSIS — M4802 Spinal stenosis, cervical region: Secondary | ICD-10-CM | POA: Diagnosis not present

## 2012-09-17 DIAGNOSIS — M5412 Radiculopathy, cervical region: Secondary | ICD-10-CM

## 2012-09-17 DIAGNOSIS — M5030 Other cervical disc degeneration, unspecified cervical region: Secondary | ICD-10-CM

## 2012-09-17 DIAGNOSIS — M542 Cervicalgia: Secondary | ICD-10-CM

## 2012-09-24 DIAGNOSIS — M545 Low back pain, unspecified: Secondary | ICD-10-CM | POA: Diagnosis not present

## 2012-09-24 DIAGNOSIS — M4802 Spinal stenosis, cervical region: Secondary | ICD-10-CM | POA: Diagnosis not present

## 2012-09-24 DIAGNOSIS — IMO0002 Reserved for concepts with insufficient information to code with codable children: Secondary | ICD-10-CM | POA: Diagnosis not present

## 2012-09-24 DIAGNOSIS — M47812 Spondylosis without myelopathy or radiculopathy, cervical region: Secondary | ICD-10-CM | POA: Diagnosis not present

## 2012-09-24 DIAGNOSIS — M546 Pain in thoracic spine: Secondary | ICD-10-CM | POA: Diagnosis not present

## 2012-09-24 DIAGNOSIS — M5412 Radiculopathy, cervical region: Secondary | ICD-10-CM | POA: Diagnosis not present

## 2012-09-29 ENCOUNTER — Other Ambulatory Visit: Payer: Self-pay | Admitting: Neurosurgery

## 2012-09-29 ENCOUNTER — Encounter (HOSPITAL_COMMUNITY): Payer: Self-pay | Admitting: Pharmacy Technician

## 2012-09-30 ENCOUNTER — Encounter (HOSPITAL_COMMUNITY): Payer: Self-pay

## 2012-09-30 MED ORDER — CEFAZOLIN SODIUM-DEXTROSE 2-3 GM-% IV SOLR
2.0000 g | INTRAVENOUS | Status: AC
  Administered 2012-10-01: 2 g via INTRAVENOUS
  Filled 2012-09-30: qty 50

## 2012-10-01 ENCOUNTER — Inpatient Hospital Stay (HOSPITAL_COMMUNITY)
Admission: RE | Admit: 2012-10-01 | Discharge: 2012-10-02 | DRG: 473 | Disposition: A | Discharge status: Home / Self Care | Payer: Medicare Other | Source: Ambulatory Visit | Attending: Neurosurgery | Admitting: Neurosurgery

## 2012-10-01 ENCOUNTER — Encounter (HOSPITAL_COMMUNITY): Payer: Self-pay | Admitting: Certified Registered"

## 2012-10-01 ENCOUNTER — Inpatient Hospital Stay (HOSPITAL_COMMUNITY): Payer: Medicare Other

## 2012-10-01 ENCOUNTER — Encounter (HOSPITAL_COMMUNITY)
Admission: RE | Disposition: A | Discharge status: Home / Self Care | Payer: Self-pay | Source: Ambulatory Visit | Attending: Neurosurgery

## 2012-10-01 ENCOUNTER — Encounter (HOSPITAL_COMMUNITY): Payer: Self-pay | Admitting: Surgery

## 2012-10-01 ENCOUNTER — Inpatient Hospital Stay (HOSPITAL_COMMUNITY): Payer: Medicare Other | Admitting: Certified Registered"

## 2012-10-01 DIAGNOSIS — Z85828 Personal history of other malignant neoplasm of skin: Secondary | ICD-10-CM

## 2012-10-01 DIAGNOSIS — Z23 Encounter for immunization: Secondary | ICD-10-CM

## 2012-10-01 DIAGNOSIS — Z87891 Personal history of nicotine dependence: Secondary | ICD-10-CM

## 2012-10-01 DIAGNOSIS — M4802 Spinal stenosis, cervical region: Secondary | ICD-10-CM | POA: Diagnosis not present

## 2012-10-01 DIAGNOSIS — M47812 Spondylosis without myelopathy or radiculopathy, cervical region: Principal | ICD-10-CM | POA: Diagnosis present

## 2012-10-01 DIAGNOSIS — M109 Gout, unspecified: Secondary | ICD-10-CM | POA: Diagnosis present

## 2012-10-01 DIAGNOSIS — E785 Hyperlipidemia, unspecified: Secondary | ICD-10-CM | POA: Diagnosis present

## 2012-10-01 DIAGNOSIS — Z8249 Family history of ischemic heart disease and other diseases of the circulatory system: Secondary | ICD-10-CM | POA: Diagnosis not present

## 2012-10-01 DIAGNOSIS — I10 Essential (primary) hypertension: Secondary | ICD-10-CM | POA: Diagnosis present

## 2012-10-01 DIAGNOSIS — M5412 Radiculopathy, cervical region: Secondary | ICD-10-CM | POA: Diagnosis not present

## 2012-10-01 DIAGNOSIS — I2510 Atherosclerotic heart disease of native coronary artery without angina pectoris: Secondary | ICD-10-CM | POA: Diagnosis present

## 2012-10-01 DIAGNOSIS — Z01818 Encounter for other preprocedural examination: Secondary | ICD-10-CM | POA: Diagnosis not present

## 2012-10-01 DIAGNOSIS — M4712 Other spondylosis with myelopathy, cervical region: Secondary | ICD-10-CM | POA: Diagnosis not present

## 2012-10-01 DIAGNOSIS — M5090 Cervical disc disorder, unspecified, unspecified cervical region: Secondary | ICD-10-CM | POA: Diagnosis not present

## 2012-10-01 DIAGNOSIS — Z79899 Other long term (current) drug therapy: Secondary | ICD-10-CM | POA: Diagnosis not present

## 2012-10-01 DIAGNOSIS — Z981 Arthrodesis status: Secondary | ICD-10-CM

## 2012-10-01 DIAGNOSIS — N529 Male erectile dysfunction, unspecified: Secondary | ICD-10-CM | POA: Diagnosis present

## 2012-10-01 HISTORY — DX: Other chronic pain: G89.29

## 2012-10-01 HISTORY — PX: POSTERIOR CERVICAL FUSION/FORAMINOTOMY: SHX5038

## 2012-10-01 HISTORY — DX: Headache: R51

## 2012-10-01 HISTORY — DX: Dorsalgia, unspecified: M54.9

## 2012-10-01 HISTORY — DX: Cervicalgia: M54.2

## 2012-10-01 LAB — BASIC METABOLIC PANEL
BUN: 22 mg/dL (ref 6–23)
CO2: 24 mEq/L (ref 19–32)
Calcium: 10.1 mg/dL (ref 8.4–10.5)
Chloride: 104 mEq/L (ref 96–112)
Creatinine, Ser: 1.17 mg/dL (ref 0.50–1.35)
GFR calc Af Amer: 67 mL/min — ABNORMAL LOW (ref >90)
GFR calc non Af Amer: 57 mL/min — ABNORMAL LOW (ref >90)
Glucose, Bld: 101 mg/dL — ABNORMAL HIGH (ref 70–99)
Potassium: 3.9 mEq/L (ref 3.5–5.1)
Sodium: 141 mEq/L (ref 135–145)

## 2012-10-01 LAB — CBC
HCT: 46.5 % (ref 39.0–52.0)
Hemoglobin: 15.7 g/dL (ref 13.0–17.0)
MCH: 29.7 pg (ref 26.0–34.0)
MCHC: 33.8 g/dL (ref 30.0–36.0)
MCV: 88.1 fL (ref 78.0–100.0)
Platelets: 435 10*3/uL — ABNORMAL HIGH (ref 150–400)
RBC: 5.28 MIL/uL (ref 4.22–5.81)
RDW: 14.2 % (ref 11.5–15.5)
WBC: 10 10*3/uL (ref 4.0–10.5)

## 2012-10-01 LAB — SURGICAL PCR SCREEN
MRSA, PCR: NEGATIVE
Staphylococcus aureus: NEGATIVE

## 2012-10-01 SURGERY — POSTERIOR CERVICAL FUSION/FORAMINOTOMY LEVEL 1
Anesthesia: General | Site: Neck | Laterality: Right | Wound class: Clean

## 2012-10-01 MED ORDER — VECURONIUM BROMIDE 10 MG IV SOLR
INTRAVENOUS | Status: DC | PRN
  Administered 2012-10-01: 1 mg via INTRAVENOUS
  Administered 2012-10-01: 2 mg via INTRAVENOUS
  Administered 2012-10-01: 1 mg via INTRAVENOUS

## 2012-10-01 MED ORDER — ACETAMINOPHEN 325 MG PO TABS: 650.0000 mg | ORAL_TABLET | ORAL | Status: DC | PRN

## 2012-10-01 MED ORDER — BUPIVACAINE HCL (PF) 0.5 % IJ SOLN
INTRAMUSCULAR | Status: DC | PRN
  Administered 2012-10-01: 10 mL

## 2012-10-01 MED ORDER — MORPHINE SULFATE 4 MG/ML IJ SOLN: 4.0000 mg | INTRAMUSCULAR | Status: DC | PRN

## 2012-10-01 MED ORDER — ACETAMINOPHEN 10 MG/ML IV SOLN
INTRAVENOUS | Status: AC
  Filled 2012-10-01: qty 100

## 2012-10-01 MED ORDER — MUPIROCIN 2 % EX OINT: TOPICAL_OINTMENT | Freq: Once | CUTANEOUS | Status: DC

## 2012-10-01 MED ORDER — NEOSTIGMINE METHYLSULFATE 1 MG/ML IJ SOLN
INTRAMUSCULAR | Status: DC | PRN
  Administered 2012-10-01: 3 mg via INTRAVENOUS

## 2012-10-01 MED ORDER — 0.9 % SODIUM CHLORIDE (POUR BTL) OPTIME
TOPICAL | Status: DC | PRN
  Administered 2012-10-01: 1000 mL

## 2012-10-01 MED ORDER — SODIUM CHLORIDE 0.9 % IJ SOLN: 3.0000 mL | INTRAMUSCULAR | Status: DC | PRN

## 2012-10-01 MED ORDER — SODIUM CHLORIDE 0.9 % IR SOLN
Status: DC | PRN
  Administered 2012-10-01: 16:00:00

## 2012-10-01 MED ORDER — BISACODYL 10 MG RE SUPP: 10.0000 mg | Freq: Every day | RECTAL | Status: DC | PRN

## 2012-10-01 MED ORDER — ALUM & MAG HYDROXIDE-SIMETH 200-200-20 MG/5ML PO SUSP: 30.0000 mL | Freq: Four times a day (QID) | ORAL | Status: DC | PRN

## 2012-10-01 MED ORDER — LIDOCAINE HCL (CARDIAC) 20 MG/ML IV SOLN
INTRAVENOUS | Status: DC | PRN
  Administered 2012-10-01: 40 mg via INTRAVENOUS

## 2012-10-01 MED ORDER — DILTIAZEM HCL ER COATED BEADS 300 MG PO CP24
300.0000 mg | ORAL_CAPSULE | Freq: Every day | ORAL | Status: DC
  Administered 2012-10-02: 300 mg via ORAL
  Filled 2012-10-01: qty 1

## 2012-10-01 MED ORDER — BACITRACIN ZINC 500 UNIT/GM EX OINT
TOPICAL_OINTMENT | CUTANEOUS | Status: DC | PRN
  Administered 2012-10-01: 1 via TOPICAL

## 2012-10-01 MED ORDER — HYDROMORPHONE HCL PF 1 MG/ML IJ SOLN: 0.2500 mg | INTRAMUSCULAR | Status: DC | PRN

## 2012-10-01 MED ORDER — ACETAMINOPHEN 650 MG RE SUPP: 650.0000 mg | RECTAL | Status: DC | PRN

## 2012-10-01 MED ORDER — HYDROXYZINE HCL 25 MG PO TABS: 50.0000 mg | ORAL_TABLET | ORAL | Status: DC | PRN

## 2012-10-01 MED ORDER — LIDOCAINE-EPINEPHRINE 1 %-1:100000 IJ SOLN
INTRAMUSCULAR | Status: DC | PRN
  Administered 2012-10-01: 10 mL

## 2012-10-01 MED ORDER — KETOROLAC TROMETHAMINE 30 MG/ML IJ SOLN
INTRAMUSCULAR | Status: AC
  Administered 2012-10-01: 15 mg via INTRAVENOUS
  Filled 2012-10-01: qty 1

## 2012-10-01 MED ORDER — ROCURONIUM BROMIDE 100 MG/10ML IV SOLN
INTRAVENOUS | Status: DC | PRN
  Administered 2012-10-01: 50 mg via INTRAVENOUS

## 2012-10-01 MED ORDER — SODIUM CHLORIDE 0.9 % IV SOLN
INTRAVENOUS | Status: AC
  Filled 2012-10-01: qty 500

## 2012-10-01 MED ORDER — ATORVASTATIN CALCIUM 40 MG PO TABS
40.0000 mg | ORAL_TABLET | Freq: Every day | ORAL | Status: DC
  Filled 2012-10-01: qty 1

## 2012-10-01 MED ORDER — MAGNESIUM HYDROXIDE 400 MG/5ML PO SUSP: 30.0000 mL | Freq: Every day | ORAL | Status: DC | PRN

## 2012-10-01 MED ORDER — MIDAZOLAM HCL 5 MG/5ML IJ SOLN
INTRAMUSCULAR | Status: DC | PRN
  Administered 2012-10-01: 2 mg via INTRAVENOUS

## 2012-10-01 MED ORDER — OXYCODONE HCL 5 MG/5ML PO SOLN: 5.0000 mg | Freq: Once | ORAL | Status: DC | PRN

## 2012-10-01 MED ORDER — BACITRACIN 50000 UNITS IM SOLR
INTRAMUSCULAR | Status: AC
  Filled 2012-10-01: qty 1

## 2012-10-01 MED ORDER — KETOROLAC TROMETHAMINE 15 MG/ML IJ SOLN: 15.0000 mg | Freq: Once | INTRAMUSCULAR | Status: DC

## 2012-10-01 MED ORDER — KETOROLAC TROMETHAMINE 30 MG/ML IJ SOLN
15.0000 mg | Freq: Four times a day (QID) | INTRAMUSCULAR | Status: DC
  Administered 2012-10-02 (×2): 15 mg via INTRAVENOUS
  Filled 2012-10-01 (×5): qty 1

## 2012-10-01 MED ORDER — OXYCODONE HCL 5 MG PO TABS: 5.0000 mg | ORAL_TABLET | ORAL | Status: DC | PRN

## 2012-10-01 MED ORDER — HYDROXYZINE HCL 50 MG/ML IM SOLN: 50.0000 mg | INTRAMUSCULAR | Status: DC | PRN

## 2012-10-01 MED ORDER — IRBESARTAN 300 MG PO TABS
300.0000 mg | ORAL_TABLET | Freq: Every day | ORAL | Status: DC
  Administered 2012-10-02: 300 mg via ORAL
  Filled 2012-10-01: qty 1

## 2012-10-01 MED ORDER — ONDANSETRON HCL 4 MG/2ML IJ SOLN
INTRAMUSCULAR | Status: DC | PRN
  Administered 2012-10-01: 4 mg via INTRAVENOUS

## 2012-10-01 MED ORDER — MUPIROCIN 2 % EX OINT
TOPICAL_OINTMENT | CUTANEOUS | Status: AC
  Filled 2012-10-01: qty 22

## 2012-10-01 MED ORDER — GLYCOPYRROLATE 0.2 MG/ML IJ SOLN
INTRAMUSCULAR | Status: DC | PRN
  Administered 2012-10-01: 0.4 mg via INTRAVENOUS

## 2012-10-01 MED ORDER — SODIUM CHLORIDE 0.9 % IJ SOLN
3.0000 mL | Freq: Two times a day (BID) | INTRAMUSCULAR | Status: DC
  Administered 2012-10-02: 3 mL via INTRAVENOUS

## 2012-10-01 MED ORDER — CYCLOBENZAPRINE HCL 10 MG PO TABS: 10.0000 mg | ORAL_TABLET | Freq: Three times a day (TID) | ORAL | Status: DC | PRN

## 2012-10-01 MED ORDER — SURGIFOAM 100 EX MISC
CUTANEOUS | Status: DC | PRN
  Administered 2012-10-01: 16:00:00 via TOPICAL

## 2012-10-01 MED ORDER — ZOLPIDEM TARTRATE 5 MG PO TABS
5.0000 mg | ORAL_TABLET | Freq: Every evening | ORAL | Status: DC | PRN
  Administered 2012-10-01: 5 mg via ORAL
  Filled 2012-10-01: qty 1

## 2012-10-01 MED ORDER — OXYCODONE HCL 5 MG PO TABS: 5.0000 mg | ORAL_TABLET | Freq: Once | ORAL | Status: DC | PRN

## 2012-10-01 MED ORDER — PROPOFOL 10 MG/ML IV BOLUS
INTRAVENOUS | Status: DC | PRN
  Administered 2012-10-01: 110 mg via INTRAVENOUS
  Administered 2012-10-01: 30 mg via INTRAVENOUS

## 2012-10-01 MED ORDER — PHENOL 1.4 % MT LIQD: 1.0000 | OROMUCOSAL | Status: DC | PRN

## 2012-10-01 MED ORDER — SODIUM CHLORIDE 0.9 % IV SOLN: 250.0000 mL | INTRAVENOUS | Status: DC

## 2012-10-01 MED ORDER — LACTATED RINGERS IV SOLN
INTRAVENOUS | Status: DC | PRN
  Administered 2012-10-01 (×2): via INTRAVENOUS

## 2012-10-01 MED ORDER — SUFENTANIL CITRATE 50 MCG/ML IV SOLN
INTRAVENOUS | Status: DC | PRN
  Administered 2012-10-01: 20 ug via INTRAVENOUS

## 2012-10-01 MED ORDER — ACETAMINOPHEN 10 MG/ML IV SOLN
1000.0000 mg | Freq: Four times a day (QID) | INTRAVENOUS | Status: DC
  Administered 2012-10-01 – 2012-10-02 (×3): 1000 mg via INTRAVENOUS
  Filled 2012-10-01 (×5): qty 100

## 2012-10-01 MED ORDER — EPHEDRINE SULFATE 50 MG/ML IJ SOLN
INTRAMUSCULAR | Status: DC | PRN
  Administered 2012-10-01: 10 mg via INTRAVENOUS

## 2012-10-01 MED ORDER — MENTHOL 3 MG MT LOZG: 1.0000 | LOZENGE | OROMUCOSAL | Status: DC | PRN

## 2012-10-01 MED ORDER — KCL IN DEXTROSE-NACL 20-5-0.45 MEQ/L-%-% IV SOLN
INTRAVENOUS | Status: DC
  Filled 2012-10-01 (×4): qty 1000

## 2012-10-01 SURGICAL SUPPLY — 70 items
4.0 x 20 mm screw ×4 IMPLANT
BAG DECANTER FOR FLEXI CONT (MISCELLANEOUS) ×2 IMPLANT
BIT DRILL NEURO 2X3.1 SFT TUCH (MISCELLANEOUS) ×1 IMPLANT
BIT DRILL WIRE PASS 1.3MM (BIT) IMPLANT
BLADE SURG 11 STRL SS (BLADE) ×2 IMPLANT
BLADE SURG ROTATE 9660 (MISCELLANEOUS) IMPLANT
BRUSH SCRUB EZ PLAIN DRY (MISCELLANEOUS) ×2 IMPLANT
CANISTER SUCTION 2500CC (MISCELLANEOUS) ×2 IMPLANT
CLOTH BEACON ORANGE TIMEOUT ST (SAFETY) ×2 IMPLANT
CONT SPEC 4OZ CLIKSEAL STRL BL (MISCELLANEOUS) ×2 IMPLANT
COVER TABLE BACK 60X90 (DRAPES) IMPLANT
DECANTER SPIKE VIAL GLASS SM (MISCELLANEOUS) ×2 IMPLANT
DERMABOND ADHESIVE PROPEN (GAUZE/BANDAGES/DRESSINGS) ×1
DERMABOND ADVANCED (GAUZE/BANDAGES/DRESSINGS)
DERMABOND ADVANCED .7 DNX12 (GAUZE/BANDAGES/DRESSINGS) IMPLANT
DERMABOND ADVANCED .7 DNX6 (GAUZE/BANDAGES/DRESSINGS) ×1 IMPLANT
DRAPE C-ARM 42X72 X-RAY (DRAPES) ×4 IMPLANT
DRAPE LAPAROTOMY 100X72 PEDS (DRAPES) ×2 IMPLANT
DRAPE POUCH INSTRU U-SHP 10X18 (DRAPES) ×2 IMPLANT
DRAPE PROXIMA HALF (DRAPES) IMPLANT
DRILL NEURO 2X3.1 SOFT TOUCH (MISCELLANEOUS) ×2
DRILL WIRE PASS 1.3MM (BIT)
DRSG EMULSION OIL 3X3 NADH (GAUZE/BANDAGES/DRESSINGS) IMPLANT
ELECT REM PT RETURN 9FT ADLT (ELECTROSURGICAL) ×2
ELECTRODE REM PT RTRN 9FT ADLT (ELECTROSURGICAL) ×1 IMPLANT
EVACUATOR 1/8 PVC DRAIN (DRAIN) IMPLANT
GAUZE SPONGE 4X4 16PLY XRAY LF (GAUZE/BANDAGES/DRESSINGS) IMPLANT
GLOVE BIO SURGEON STRL SZ8 (GLOVE) ×2 IMPLANT
GLOVE BIOGEL PI IND STRL 7.5 (GLOVE) ×1 IMPLANT
GLOVE BIOGEL PI IND STRL 8 (GLOVE) ×1 IMPLANT
GLOVE BIOGEL PI IND STRL 8.5 (GLOVE) ×1 IMPLANT
GLOVE BIOGEL PI INDICATOR 7.5 (GLOVE) ×1
GLOVE BIOGEL PI INDICATOR 8 (GLOVE) ×1
GLOVE BIOGEL PI INDICATOR 8.5 (GLOVE) ×1
GLOVE ECLIPSE 7.5 STRL STRAW (GLOVE) ×8 IMPLANT
GLOVE EXAM NITRILE LRG STRL (GLOVE) IMPLANT
GLOVE EXAM NITRILE MD LF STRL (GLOVE) IMPLANT
GLOVE EXAM NITRILE XL STR (GLOVE) IMPLANT
GLOVE EXAM NITRILE XS STR PU (GLOVE) IMPLANT
GOWN BRE IMP SLV AUR LG STRL (GOWN DISPOSABLE) ×2 IMPLANT
GOWN BRE IMP SLV AUR XL STRL (GOWN DISPOSABLE) ×4 IMPLANT
GOWN STRL REIN 2XL LVL4 (GOWN DISPOSABLE) IMPLANT
HEMOSTAT SURGICEL 2X14 (HEMOSTASIS) IMPLANT
KIT BASIN OR (CUSTOM PROCEDURE TRAY) ×2 IMPLANT
KIT INFUSE X SMALL 1.4CC (Orthopedic Implant) ×2 IMPLANT
KIT ROOM TURNOVER OR (KITS) ×2 IMPLANT
NEEDLE SPNL 18GX3.5 QUINCKE PK (NEEDLE) ×2 IMPLANT
NEEDLE SPNL 22GX3.5 QUINCKE BK (NEEDLE) ×4 IMPLANT
NS IRRIG 1000ML POUR BTL (IV SOLUTION) ×2 IMPLANT
PACK LAMINECTOMY NEURO (CUSTOM PROCEDURE TRAY) ×2 IMPLANT
PAD ARMBOARD 7.5X6 YLW CONV (MISCELLANEOUS) ×4 IMPLANT
PIN MAYFIELD SKULL DISP (PIN) ×2 IMPLANT
ROD VUEPOINT 3.5X60 (Rod) ×2 IMPLANT
SCREW MA MM 3.5X12 (Screw) ×4 IMPLANT
SCREW SET THREADED (Screw) ×8 IMPLANT
SPONGE GAUZE 4X4 12PLY (GAUZE/BANDAGES/DRESSINGS) IMPLANT
SPONGE LAP 4X18 X RAY DECT (DISPOSABLE) IMPLANT
SPONGE SURGIFOAM ABS GEL 100 (HEMOSTASIS) ×2 IMPLANT
STAPLER SKIN PROX WIDE 3.9 (STAPLE) ×2 IMPLANT
STRIP BIOACTIVE VITOSS 25X52X4 (Orthopedic Implant) ×2 IMPLANT
SUT ETHILON 3 0 FSL (SUTURE) IMPLANT
SUT VIC AB 0 CT1 18XCR BRD8 (SUTURE) ×1 IMPLANT
SUT VIC AB 0 CT1 8-18 (SUTURE) ×1
SUT VIC AB 2-0 CP2 18 (SUTURE) ×2 IMPLANT
SYR 20ML ECCENTRIC (SYRINGE) ×2 IMPLANT
TOWEL OR 17X24 6PK STRL BLUE (TOWEL DISPOSABLE) ×2 IMPLANT
TOWEL OR 17X26 10 PK STRL BLUE (TOWEL DISPOSABLE) ×2 IMPLANT
TRAY FOLEY CATH 14FRSI W/METER (CATHETERS) IMPLANT
UNDERPAD 30X30 INCONTINENT (UNDERPADS AND DIAPERS) IMPLANT
WATER STERILE IRR 1000ML POUR (IV SOLUTION) ×2 IMPLANT

## 2012-10-01 NOTE — Progress Notes (Signed)
Filed Vitals:   10/01/12 1945 10/01/12 2000 10/01/12 2015 10/01/12 2054  BP: 162/68 157/67 159/92 177/89  Pulse: 56 55 60 62  Temp:   97.6 F (36.4 C) 98.3 F (36.8 C)  TempSrc:      Resp: 15 12 19 20   Height:      Weight:      SpO2: 97% 98% 97% 95%    CBC  Recent Labs  10/01/12 1300  WBC 10.0  HGB 15.7  HCT 46.5  PLT 435*   BMET  Recent Labs  10/01/12 1300  NA 141  K 3.9  CL 104  CO2 24  GLUCOSE 101*  BUN 22  CREATININE 1.17  CALCIUM 10.1    Patient resting in bed, has been up to the bathroom, and voided. Dressing clean and dry. Taking liquids by mouth, a meal has been requested. Feels that he has more sensation in the right forearm at this point.  Plan: Doing well following surgery, we'll continue to progress to postoperative recovery.  Hewitt Shorts, MD 10/01/2012, 9:16 PM

## 2012-10-01 NOTE — H&P (Addendum)
Subjective: Patient is a 79 y.o. male who is admitted for treatment of right C8 radiculopathy with weakness and atrophy in the intrinsic musculature of the right hand, secondary to right C7-T1 neural foraminal stenosis secondary to right C7-T1 hypertrophic facet arthropathy. Patient admitted for a right C7-T1 posterior cervical laminotomy and foraminotomy, and bilateral C7-T1 posterior cervical arthrodesis with lateral mass screws, rods, and bone graft.    Patient Active Problem List   Diagnosis Date Noted  . Right elbow pain 07/18/2012  . GOUT, UNSPECIFIED 02/20/2010  . ERECTILE DYSFUNCTION, ORGANIC 02/20/2010  . KNEE PAIN 10/10/2009  . PATELLAR TENDINITIS 10/10/2009  . HYPERLIPIDEMIA, WITH HIGH HDL 10/18/2008  . CARPAL TUNNEL SYNDROME, LEFT 04/13/2008  . ACTINIC KERATOSIS, HEAD 03/23/2008  . BENIGN PROSTATIC HYPERTROPHY, WITH URINARY OBSTRUCTION 10/16/2007  . CAD 08/18/2007  . UNS ADVRS EFF UNS RX MEDICINAL&BIOLOGICAL SBSTNC 08/18/2007  . NEOPLASM, MALIGNANT, SKIN, TRUNK 05/09/2007  . HYPERTENSION 05/09/2007  . ALLERGIC RHINITIS 03/27/2007  . LOW BACK PAIN 03/27/2007  . SKIN CANCER, HX OF 03/27/2007   Past Medical History  Diagnosis Date  . ACTINIC KERATOSIS, HEAD 03/23/2008  . ALLERGIC RHINITIS 03/27/2007  . BENIGN PROSTATIC HYPERTROPHY, WITH URINARY OBSTRUCTION 10/16/2007  . CARPAL TUNNEL SYNDROME, LEFT 04/13/2008  . ERECTILE DYSFUNCTION, ORGANIC 02/20/2010  . Gout, unspecified 02/20/2010  . HYPERLIPIDEMIA, WITH HIGH HDL 10/18/2008  . KNEE PAIN 10/10/2009  . LOW BACK PAIN 03/27/2007  . NEOPLASM, MALIGNANT, SKIN, TRUNK 05/09/2007  . Patellar tendinitis 10/10/2009  . SKIN CANCER, HX OF 03/27/2007  . UNS ADVRS EFF UNS RX MEDICINAL&BIOLOGICAL SBSTNC 08/18/2007  . HYPERTENSION 05/09/2007    sees Dr. Darryll Capers  . Headache     migraines hx of  . Chronic neck pain   . Chronic back pain   . CAD 08/18/2007    "patient denies any issues with heart, does not see cardiologist"    Past Surgical  History  Procedure Laterality Date  . Knee arthroscopy    . Appendectomy    . Cholecystectomy    . Nasal sinus surgery    . Vasectomy    . Lumbar fusions    . Joint replacement surgery    . Rectal fissure    . Lateral fusion lumbar spine, transverse    . Laminectomy    . Lumbar fusion    . Rectal fissure    . Vitrectomy      right and left 2013  . Carpal tunnel release    . Eye surgery      bilateral cataracts removed and vitrectomies  . Blepharoplasty    . Back surgery      No prescriptions prior to admission   No Known Allergies  History  Substance Use Topics  . Smoking status: Former Smoker    Quit date: 09/29/1981  . Smokeless tobacco: Not on file  . Alcohol Use: No    Family History  Problem Relation Age of Onset  . Dementia Mother   . Anemia Father   . Heart disease Father      Review of Systems A comprehensive review of systems was negative.  Objective: Vital signs in last 24 hours: Weight:  [92.08 kg (203 lb)] 92.08 kg (203 lb) (02/18 0948)  EXAM: Patient well-developed alert white male in no acute distress.  Lungs are clear to auscultation , the patient has symmetrical respiratory excursion. Heart has a regular rate and rhythm normal S1 and S2 no murmur.   Abdomen is soft nontender nondistended bowel sounds are  present. Extremity examination shows no clubbing cyanosis or edema. Musculoskeletal examination shows no tenderness patient was cervical spinous processes or paracervical musculature. He has good range of motion neck flexion, extension, and lateral flexion to either side Neurologic examination shows 5 over 5 strength to the left upper extremity including the deltoid, biceps, triceps, intrinsics, and grip. However in the right upper extremity the deltoid and biceps are 5, triceps is 4+, intrinsics are 3 with evidence of atrophy in the dorsal interossei, and the grip is 4 minus. Sensation is decreased to pinprick in the fourth and fifth digits the right  hand, as well as to the dorsum of the right hand. Reflexes are minimal the biceps brachioradialis triceps quadriceps and gastrocnemius. They're symmetrical bilaterally, and toes are downgoing bilaterally. He has normal gait and stance are   Data Review:CBC    Component Value Date/Time   WBC 11.7* 08/12/2012 1129   RBC 5.07 08/12/2012 1129   HGB 14.9 08/12/2012 1129   HCT 44.8 08/12/2012 1129   PLT 451.0* 08/12/2012 1129   MCV 88.4 08/12/2012 1129   MCHC 33.4 08/12/2012 1129   RDW 14.2 08/12/2012 1129   LYMPHSABS 3.1 08/12/2012 1129   MONOABS 0.8 08/12/2012 1129   EOSABS 0.3 08/12/2012 1129   BASOSABS 0.1 08/12/2012 1129                          BMET    Component Value Date/Time   NA 138 08/12/2012 1129   K 4.1 08/12/2012 1129   CL 102 08/12/2012 1129   CO2 27 08/12/2012 1129   GLUCOSE 86 08/12/2012 1129   BUN 21 08/12/2012 1129   CREATININE 1.2 08/12/2012 1129   CALCIUM 9.1 08/12/2012 1129   GFRNONAA 79.82 05/22/2010 1147   GFRAA  Value: >60        The eGFR has been calculated using the MDRD equation. This calculation has not been validated in all clinical situations. eGFR's persistently <60 mL/min signify possible Chronic Kidney Disease. 11/27/2009 0405     Assessment/Plan: Patient with the right C8 radiculopathy secondary to right C7-T1 neural foraminal stenosis secondary to right C7-T1 hypertrophic facet arthropathy. Patient is admitted now for a right C7-T1 posterior cervical laminotomy foraminotomy, and a bilateral C7-T1 posterior cervical arthrodesis with lateral mass screws, rods, and bone graft.  I've discussed with the patient the nature of his condition, the nature the surgical procedure, the typical length of surgery, hospital stay, and overall recuperation. We discussed limitations postoperatively. I discussed risks of surgery including risks of infection, bleeding, possibly need for transfusion, the risk of nerve root dysfunction with pain, weakness, numbness, or  paresthesias, the risk of spinal cord dysfunction with paralysis of all 4 limbs and quadriplegia, and the risk of dural tear and CSF leakage and possible need for further surgery, the risk of failure of the arthrodesis and the possible need for further surgery, and the risk of anesthetic complications including myocardial infarction, stroke, pneumonia, and death. We also discussed the need for postoperative immobilization in a cervical collar. Understanding all this the patient does wish to proceed with surgery and is admitted for such.    Hewitt Shorts, MD 10/01/2012 8:07 AM

## 2012-10-01 NOTE — Anesthesia Procedure Notes (Signed)
Procedure Name: Intubation Date/Time: 10/01/2012 3:42 PM Performed by: Charm Barges, Nikos Anglemyer R Pre-anesthesia Checklist: Patient identified, Emergency Drugs available, Suction available, Patient being monitored and Timeout performed Patient Re-evaluated:Patient Re-evaluated prior to inductionOxygen Delivery Method: Circle system utilized Preoxygenation: Pre-oxygenation with 100% oxygen Intubation Type: IV induction Ventilation: Mask ventilation without difficulty Laryngoscope Size: Mac and 4 Grade View: Grade I Tube type: Oral Tube size: 8.0 mm Number of attempts: 1 Airway Equipment and Method: Stylet Placement Confirmation: ETT inserted through vocal cords under direct vision,  positive ETCO2 and breath sounds checked- equal and bilateral Secured at: 22 cm Tube secured with: Tape Dental Injury: Teeth and Oropharynx as per pre-operative assessment

## 2012-10-01 NOTE — Anesthesia Postprocedure Evaluation (Signed)
  Anesthesia Post-op Note  Patient: [name]  Procedure(s) Performed: Procedure(s) with comments: POSTERIOR CERVICAL FUSION/FORAMINOTOMY LEVEL 1 (Right) - Right Cervical seven thoracic one Cervical laminectomy/foraminotomy with posterior cervical arthrodesis   Patient Location: PACU  Anesthesia Type:General  Level of Consciousness: awake, alert  and oriented  Airway and Oxygen Therapy: Patient Spontanous Breathing and Patient connected to nasal cannula oxygen  Post-op Pain: none  Post-op Assessment: Post-op Vital signs reviewed, Patient's Cardiovascular Status Stable, Respiratory Function Stable, Patent Airway, No signs of Nausea or vomiting and Pain level controlled  Post-op Vital Signs: stable  Complications: No apparent anesthesia complications

## 2012-10-01 NOTE — Preoperative (Signed)
Beta Blockers   Reason not to administer Beta Blockers:Not Applicable 

## 2012-10-01 NOTE — Transfer of Care (Signed)
Immediate Anesthesia Transfer of Care Note  Patient: [name]  Procedure(s) Performed: Procedure(s) with comments: POSTERIOR CERVICAL FUSION/FORAMINOTOMY LEVEL 1 (Right) - Right Cervical seven thoracic one Cervical laminectomy/foraminotomy with posterior cervical arthrodesis   Patient Location: PACU  Anesthesia Type:General  Level of Consciousness: awake, alert  and oriented  Airway & Oxygen Therapy: Patient Spontanous Breathing and Patient connected to nasal cannula oxygen  Post-op Assessment: Report given to PACU RN, Post -op Vital signs reviewed and stable and Patient moving all extremities  Post vital signs: Reviewed and stable  Complications: No apparent anesthesia complications

## 2012-10-01 NOTE — Op Note (Signed)
10/01/2012  8:56 PM  PATIENT:  [name]  79 y.o. male  PRE-OPERATIVE DIAGNOSIS:  Right C7-T1 neural foraminal Cervicothoracic stenosis, Cervical spondylosis without myelopathy, Cervical radiculopathy  POST-OPERATIVE DIAGNOSIS:  Right C7-T1 neural foraminal Cervicothoracic stenosis, Cervical spondylosis without myelopathy, Cervical radiculopathy  PROCEDURE:  Procedure(s): POSTERIOR CERVICAL FUSION/FORAMINOTOMY LEVEL 1:    Right C7-T1 cervicothoracic laminotomy, facetectomy, and foraminotomy, with decompression of the exiting right C8 nerve root with microdissection, microsurgical technique, and the operating microscope will; bilateral C7-T1 posterior cervical thoracic arthrodesis with vuepoint posterior instrumentation specifically C7 lateral mass screws and T1 pedicle screws, rods, Vitoss BA, and infuse   SURGEON:  Surgeon(s): Hewitt Shorts, MD Maeola Harman, MD  ASSISTANTS:Joseph Venetia Maxon, M.D.   ANESTHESIA:   general  EBL:  50 cc  BLOOD ADMINISTERED:none  COUNT:  Correct per nursing staff   DICTATION:  patient was brought to the operating room, placed under general endotracheal anesthesia. Patient was placed in a 3 pin Mayfield head holder, and turned to a prone position. We did use the radiolucent 3 pin Mayfield head holder. The posterior neck and upper back were prepped with Betadine soap and solution and draped in a sterile fashion. We used C-arm fluoroscopic guidance throughout the procedure, initially for localization of the C7-T1 level. The overlying skin and subcutaneous tissue were infiltrated with local site with epinephrine. Incision was made over the C7-T1 level and carried down to subcutaneous tissue. Bipolar cautery and electrocautery were used to maintain hemostasis.  The posterior cervical thoracic fascia was incised bilaterally and the paraspinal musculature was dissected from the spinous process lamina subperiosteal fashion. The C7-T1 level was identified and  dissection was carried out laterally exposing the lateral masses of C7 and the C7-T1 facet joints. The operating microscope was then draped and brought in the field provided additional magnification, illumination, and visualization. We began a right C7-T1 laminotomy and facetectomy using the high-speed drill and 2 mm Kerrison punches with thin footplate. We found extensive hypertrophic facet arthropathy involving the right C7-T1 facet complex. We carefully removed the ligamentum flavum and expose the dorsolateral aspect of the thecal sac. We then began to remove some of the hypertrophic spoke overgrowth compressing the exiting right C8 nerve root. We found extensive hypertrophic bone and cartilage tissue severely compressing the exiting right C8 nerve root. This was carefully removed, taking care to leave the C8 nerve root itself undisturbed. In the end excellent decompression of the nerve root was achieved from the lateral epidural space through the neural foramen. Once the decompression was completed hemostasis the use of Gelfoam with thrombin, it was removed prior to closure. We then identified entry points for lateral mass screws bilaterally for the C7 lateral masses. A pilot hole was started with the high-speed drill, and then the remainder the screw hole was made using a hand drill. We examined the screw hole with the ball probe, good bony surfaces were found. We then tapped the posterior cortex, and placed 3.5 x 12 mm screws bilaterally. Then using C-arm fluoroscopic guidance in A-P projection entry points were selected for  bilateral T1 pedicle screws. On the right side were able to confirm our positioning with direct palpation of the right T1 pedicle with a microhook. Again a pilot hole was made bilaterally at the selected entry point with the high-speed drill, and then the screw holes made into the pedicle bilaterally with a hand drill. All this was done using C-arm fluoroscopic guidance. We examined each  screw hole with the  ball probe, good rongeurs were found. We then packed the posterior cortex, and placed 4 x 20 mm screws bilaterally. We then cut a rod to 2 segments of about a 2 cm length. These were placed within the screw heads, and secured with locking caps. Once all 4 locking caps were in place final tightening was performed against a counter torque. We decorticated the spinous process lamina on the left side and packed a combination of infuse and Vitoss BA over the left side of the spinous process lamina. We could not packed bone on the right side due to the extent of the laminectomy and facetectomy. The wound was irrigated numerous times the procedure with saline solution, and then subsequently bacitracin solution. Hemostasis was established and confirmed prior to closure. Deep fascia was closed with interrupted undyed 0 Vicryl sutures. Scarpa's fascia was closed with interrupted undyed 0 Vicryl sutures. Subcutaneous and subcuticular layers were closed with interrupted inverted 2-0 Vicryl sutures. Skin edges were approximated with Dermabond. A dressing of sterile gauze and Hypafix was applied. The patient was then turned back to a supine position, the 3 pin Mayfield head holder was removed, and the patient was to be reversed and the anesthetic, extubated, and transferred to the recovery room for further care.   PLAN OF CARE: Admit for overnight observation  PATIENT DISPOSITION:  PACU - hemodynamically stable.   Delay start of Pharmacological VTE agent (>24hrs) due to surgical blood loss or risk of bleeding:  yes

## 2012-10-01 NOTE — Anesthesia Preprocedure Evaluation (Signed)
Anesthesia Evaluation  Patient identified by MRN, date of birth, ID band Patient awake    Reviewed: Allergy & Precautions, H&P , NPO status , Patient's Chart, lab work & pertinent test results  Airway Mallampati: II TM Distance: >3 FB Neck ROM: Full    Dental no notable dental hx. (+) Teeth Intact and Dental Advisory Given   Pulmonary neg pulmonary ROS,  breath sounds clear to auscultation  Pulmonary exam normal       Cardiovascular hypertension, On Medications Rhythm:Regular Rate:Normal     Neuro/Psych  Headaches, PSYCHIATRIC DISORDERS  Neuromuscular disease    GI/Hepatic negative GI ROS, Neg liver ROS,   Endo/Other  negative endocrine ROS  Renal/GU negative Renal ROS  negative genitourinary   Musculoskeletal   Abdominal   Peds  Hematology negative hematology ROS (+)   Anesthesia Other Findings   Reproductive/Obstetrics negative OB ROS                           Anesthesia Physical Anesthesia Plan  ASA: II  Anesthesia Plan: General   Post-op Pain Management:    Induction: Intravenous  Airway Management Planned: Oral ETT  Additional Equipment:   Intra-op Plan:   Post-operative Plan: Extubation in OR  Informed Consent: I have reviewed the patients History and Physical, chart, labs and discussed the procedure including the risks, benefits and alternatives for the proposed anesthesia with the patient or authorized representative who has indicated his/her understanding and acceptance.   Dental advisory given  Plan Discussed with: CRNA  Anesthesia Plan Comments:         Anesthesia Quick Evaluation

## 2012-10-02 MED ORDER — OXYCODONE-ACETAMINOPHEN 5-325 MG PO TABS: 1.0000 | ORAL_TABLET | ORAL | Status: DC | PRN

## 2012-10-02 MED ORDER — PNEUMOCOCCAL VAC POLYVALENT 25 MCG/0.5ML IJ INJ
0.5000 mL | INJECTION | INTRAMUSCULAR | Status: AC
  Administered 2012-10-02: 0.5 mL via INTRAMUSCULAR
  Filled 2012-10-02: qty 0.5

## 2012-10-02 NOTE — Progress Notes (Signed)
UR COMPLETED  

## 2012-10-02 NOTE — Progress Notes (Signed)
Pt given D/C instructions with Rx, verbal understanding given. Pt D/C'd home via wheelchair with family per MD order. Rema Fendt, RN

## 2012-10-02 NOTE — Discharge Summary (Signed)
Physician Discharge Summary  Patient ID: [name] MRN: [id number] DOB/AGE: April 09, 1932 79 y.o.  Admit date: 10/01/2012 Discharge date: 10/02/2012  Admission Diagnoses:  Right C7-T1 neural foraminal cervical thoracic stenosis, cervical spondylosis, cervical radiculopathy  Discharge Diagnoses: Right C7-T1 neural foraminal cervical thoracic stenosis, cervical spondylosis, cervical radiculopathy  Active Problems:   * No active hospital problems. *   Discharged Condition: good  Hospital Course:  patient is admitted underwent a right C7-T1 laminotomy, facetectomy, and foraminotomy, with decompression of the exiting right C8 nerve root, and a bowel C7-T1 posterior cervical arthrodesis with lateral mass screws, pedicle screws, and bone graft. Postoperatively he has done well. He is up and going. His wound is healing well. We are discharging him to home with instructions regarding wound care and activities. He is to return for followup with me in 3 weeks.   Discharge Exam: Blood pressure 194/90, pulse 89, temperature 98.3 F (36.8 C), temperature source Oral, resp. rate 16, height 6' (1.829 m), weight 91.173 kg (201 lb), SpO2 97.00%.  Disposition:  home    Future Appointments Provider Department Dept Phone   12/15/2012 1:45 PM Stacie Glaze, MD  [facility] [phone]       Medication List    TAKE these medications       diltiazem 300 MG 24 hr capsule  Commonly known as:  CARDIZEM CD  Take 300 mg by mouth daily.     HYDROcodone-acetaminophen 10-325 MG per tablet  Commonly known as:  NORCO  Take 1 tablet by mouth every 8 (eight) hours as needed for pain.     orphenadrine 100 MG tablet  Commonly known as:  NORFLEX  Take 1 tablet (100 mg total) by mouth 2 (two) times daily.     oxyCODONE-acetaminophen 5-325 MG per tablet  Commonly known as:  PERCOCET/ROXICET  Take 1-2 tablets by mouth every 4 (four) hours as needed for pain.     rosuvastatin 20 MG tablet   Commonly known as:  CRESTOR  Take 20 mg by mouth once a week.     telmisartan 80 MG tablet  Commonly known as:  MICARDIS  Take 1 tablet (80 mg total) by mouth daily.     triamcinolone cream 0.5 %  Commonly known as:  KENALOG  Apply 1 application topically 2 (two) times daily as needed (for rash).         Signed: Hewitt Shorts, MD 10/02/2012, 2:10 PM

## 2012-10-03 ENCOUNTER — Encounter (HOSPITAL_COMMUNITY): Payer: Self-pay | Admitting: Neurosurgery

## 2012-10-06 ENCOUNTER — Other Ambulatory Visit: Payer: Self-pay | Admitting: Neurosurgery

## 2012-10-06 DIAGNOSIS — IMO0002 Reserved for concepts with insufficient information to code with codable children: Secondary | ICD-10-CM

## 2012-10-06 DIAGNOSIS — M5412 Radiculopathy, cervical region: Secondary | ICD-10-CM

## 2012-10-17 ENCOUNTER — Other Ambulatory Visit: Payer: Medicare Other

## 2012-10-20 ENCOUNTER — Other Ambulatory Visit: Payer: Self-pay | Admitting: Internal Medicine

## 2012-10-24 ENCOUNTER — Ambulatory Visit
Admission: RE | Admit: 2012-10-24 | Discharge: 2012-10-24 | Disposition: A | Discharge status: Home / Self Care | Payer: Medicare Other | Source: Ambulatory Visit | Attending: Neurosurgery | Admitting: Neurosurgery

## 2012-10-24 DIAGNOSIS — M4802 Spinal stenosis, cervical region: Secondary | ICD-10-CM | POA: Diagnosis not present

## 2012-10-24 DIAGNOSIS — M4310 Spondylolisthesis, site unspecified: Secondary | ICD-10-CM | POA: Diagnosis not present

## 2012-10-24 DIAGNOSIS — IMO0002 Reserved for concepts with insufficient information to code with codable children: Secondary | ICD-10-CM

## 2012-10-24 DIAGNOSIS — M5412 Radiculopathy, cervical region: Secondary | ICD-10-CM

## 2012-10-24 DIAGNOSIS — M48061 Spinal stenosis, lumbar region without neurogenic claudication: Secondary | ICD-10-CM | POA: Diagnosis not present

## 2012-10-24 MED ORDER — GADOBENATE DIMEGLUMINE 529 MG/ML IV SOLN
19.0000 mL | Freq: Once | INTRAVENOUS | Status: AC | PRN
  Administered 2012-10-24: 19 mL via INTRAVENOUS

## 2012-10-27 ENCOUNTER — Other Ambulatory Visit: Payer: Self-pay | Admitting: Neurosurgery

## 2012-10-27 ENCOUNTER — Other Ambulatory Visit: Payer: Self-pay | Admitting: Internal Medicine

## 2012-10-27 ENCOUNTER — Encounter (HOSPITAL_COMMUNITY): Payer: Self-pay | Admitting: Pharmacy Technician

## 2012-10-28 NOTE — Pre-Procedure Instructions (Signed)
[name]  10/28/2012   Your procedure is scheduled on:  Monday November 03, 2012.  Report to [facility] at 7:15 AM.  Call this number if you have problems the morning of surgery: [phone]   Remember:   Do not eat food or drink liquids after midnight.   Take these medicines the morning of surgery with A SIP OF WATER: Diltiazem (Cardizem), Oxycodone (Percocet) if needed for pain, Telmisartan (Micardis)   Discontinue aspirin, and herbal medications 7 days prior to surgery   Do not wear jewelry  Do not wear lotions or colognes.   Men may shave face and neck.  Do not bring valuables to the hospital.  Contacts, dentures or bridgework may not be worn into surgery.  Leave suitcase in the car. After surgery it may be brought to your room.  For patients admitted to the hospital, checkout time is 11:00 AM the day of discharge.   Patients discharged the day of surgery will not be allowed to drive home.  Name and phone number of your driver: Family/Friend  Special Instructions: Shower using CHG 2 nights before surgery and the night before surgery.  If you shower the day of surgery use CHG.  Use special wash - you have one bottle of CHG for all showers.  You should use approximately 1/3 of the bottle for each shower.   Please read over the following fact sheets that you were given: Pain Booklet, Coughing and Deep Breathing, MRSA Information and Surgical Site Infection Prevention

## 2012-10-29 ENCOUNTER — Encounter (HOSPITAL_COMMUNITY)
Admission: RE | Admit: 2012-10-29 | Discharge: 2012-10-29 | Disposition: A | Discharge status: Home / Self Care | Payer: Medicare Other | Source: Ambulatory Visit | Attending: Neurosurgery | Admitting: Neurosurgery

## 2012-10-29 ENCOUNTER — Encounter (HOSPITAL_COMMUNITY): Payer: Self-pay

## 2012-10-29 DIAGNOSIS — I10 Essential (primary) hypertension: Secondary | ICD-10-CM | POA: Diagnosis not present

## 2012-10-29 DIAGNOSIS — M5137 Other intervertebral disc degeneration, lumbosacral region: Secondary | ICD-10-CM | POA: Diagnosis not present

## 2012-10-29 DIAGNOSIS — M47817 Spondylosis without myelopathy or radiculopathy, lumbosacral region: Secondary | ICD-10-CM | POA: Diagnosis not present

## 2012-10-29 DIAGNOSIS — Z79899 Other long term (current) drug therapy: Secondary | ICD-10-CM | POA: Diagnosis not present

## 2012-10-29 DIAGNOSIS — Z01812 Encounter for preprocedural laboratory examination: Secondary | ICD-10-CM | POA: Diagnosis not present

## 2012-10-29 HISTORY — DX: Urgency of urination: R39.15

## 2012-10-29 LAB — BASIC METABOLIC PANEL
BUN: 17 mg/dL (ref 6–23)
CO2: 27 mEq/L (ref 19–32)
Calcium: 9.6 mg/dL (ref 8.4–10.5)
Chloride: 99 mEq/L (ref 96–112)
Creatinine, Ser: 0.95 mg/dL (ref 0.50–1.35)
GFR calc Af Amer: 89 mL/min — ABNORMAL LOW (ref >90)
GFR calc non Af Amer: 77 mL/min — ABNORMAL LOW (ref >90)
Glucose, Bld: 97 mg/dL (ref 70–99)
Potassium: 4.4 mEq/L (ref 3.5–5.1)
Sodium: 135 mEq/L (ref 135–145)

## 2012-10-29 LAB — CBC
HCT: 43.1 % (ref 39.0–52.0)
Hemoglobin: 14.9 g/dL (ref 13.0–17.0)
MCH: 30.3 pg (ref 26.0–34.0)
MCHC: 34.6 g/dL (ref 30.0–36.0)
MCV: 87.8 fL (ref 78.0–100.0)
Platelets: 351 10*3/uL (ref 150–400)
RBC: 4.91 MIL/uL (ref 4.22–5.81)
RDW: 13.9 % (ref 11.5–15.5)
WBC: 6.9 10*3/uL (ref 4.0–10.5)

## 2012-10-29 LAB — SURGICAL PCR SCREEN
MRSA, PCR: NEGATIVE
Staphylococcus aureus: POSITIVE — AB

## 2012-10-29 NOTE — Progress Notes (Signed)
Patient denied having a stress test, cardiac cath, or sleep study. PCP is Dr. Darryll Capers. Encounters in [facility].

## 2012-11-02 MED ORDER — CEFAZOLIN SODIUM-DEXTROSE 2-3 GM-% IV SOLR
2.0000 g | INTRAVENOUS | Status: AC
  Filled 2012-11-02: qty 50

## 2012-11-03 ENCOUNTER — Ambulatory Visit (HOSPITAL_COMMUNITY): Payer: Medicare Other

## 2012-11-03 ENCOUNTER — Ambulatory Visit (HOSPITAL_COMMUNITY)
Admission: RE | Admit: 2012-11-03 | Discharge: 2012-11-04 | Disposition: A | Discharge status: Home / Self Care | Payer: Medicare Other | Source: Ambulatory Visit | Attending: Neurosurgery | Admitting: Neurosurgery

## 2012-11-03 ENCOUNTER — Ambulatory Visit (HOSPITAL_COMMUNITY): Payer: Medicare Other | Admitting: Certified Registered"

## 2012-11-03 ENCOUNTER — Encounter (HOSPITAL_COMMUNITY)
Admission: RE | Disposition: A | Discharge status: Home / Self Care | Payer: Self-pay | Source: Ambulatory Visit | Attending: Neurosurgery

## 2012-11-03 ENCOUNTER — Encounter (HOSPITAL_COMMUNITY): Payer: Self-pay | Admitting: Surgery

## 2012-11-03 ENCOUNTER — Encounter (HOSPITAL_COMMUNITY): Payer: Self-pay | Admitting: Certified Registered"

## 2012-11-03 DIAGNOSIS — Z79899 Other long term (current) drug therapy: Secondary | ICD-10-CM | POA: Diagnosis not present

## 2012-11-03 DIAGNOSIS — I10 Essential (primary) hypertension: Secondary | ICD-10-CM | POA: Insufficient documentation

## 2012-11-03 DIAGNOSIS — M5137 Other intervertebral disc degeneration, lumbosacral region: Secondary | ICD-10-CM | POA: Insufficient documentation

## 2012-11-03 DIAGNOSIS — IMO0002 Reserved for concepts with insufficient information to code with codable children: Secondary | ICD-10-CM | POA: Diagnosis not present

## 2012-11-03 DIAGNOSIS — M51379 Other intervertebral disc degeneration, lumbosacral region without mention of lumbar back pain or lower extremity pain: Secondary | ICD-10-CM | POA: Insufficient documentation

## 2012-11-03 DIAGNOSIS — Z01812 Encounter for preprocedural laboratory examination: Secondary | ICD-10-CM | POA: Diagnosis not present

## 2012-11-03 DIAGNOSIS — M519 Unspecified thoracic, thoracolumbar and lumbosacral intervertebral disc disorder: Secondary | ICD-10-CM | POA: Diagnosis not present

## 2012-11-03 DIAGNOSIS — M47817 Spondylosis without myelopathy or radiculopathy, lumbosacral region: Secondary | ICD-10-CM | POA: Insufficient documentation

## 2012-11-03 DIAGNOSIS — M48061 Spinal stenosis, lumbar region without neurogenic claudication: Secondary | ICD-10-CM | POA: Diagnosis not present

## 2012-11-03 HISTORY — PX: LUMBAR LAMINECTOMY/DECOMPRESSION MICRODISCECTOMY: SHX5026

## 2012-11-03 SURGERY — LUMBAR LAMINECTOMY/DECOMPRESSION MICRODISCECTOMY 1 LEVEL
Anesthesia: General | Site: Back | Laterality: Left | Wound class: Clean

## 2012-11-03 MED ORDER — KETOROLAC TROMETHAMINE 30 MG/ML IJ SOLN
INTRAMUSCULAR | Status: AC
  Administered 2012-11-03: 30 mg
  Filled 2012-11-03: qty 1

## 2012-11-03 MED ORDER — NEOSTIGMINE METHYLSULFATE 1 MG/ML IJ SOLN
INTRAMUSCULAR | Status: DC | PRN
  Administered 2012-11-03: 4 mg via INTRAVENOUS

## 2012-11-03 MED ORDER — OXYCODONE HCL 5 MG PO TABS: 5.0000 mg | ORAL_TABLET | ORAL | Status: DC | PRN

## 2012-11-03 MED ORDER — IRBESARTAN 300 MG PO TABS
300.0000 mg | ORAL_TABLET | Freq: Every day | ORAL | Status: DC
  Filled 2012-11-03: qty 1

## 2012-11-03 MED ORDER — ONDANSETRON HCL 4 MG/2ML IJ SOLN
INTRAMUSCULAR | Status: DC | PRN
  Administered 2012-11-03: 4 mg via INTRAVENOUS

## 2012-11-03 MED ORDER — KCL IN DEXTROSE-NACL 20-5-0.45 MEQ/L-%-% IV SOLN
INTRAVENOUS | Status: DC
  Filled 2012-11-03 (×4): qty 1000

## 2012-11-03 MED ORDER — PROPOFOL 10 MG/ML IV BOLUS
INTRAVENOUS | Status: DC | PRN
  Administered 2012-11-03: 200 mg via INTRAVENOUS

## 2012-11-03 MED ORDER — KETOROLAC TROMETHAMINE 30 MG/ML IJ SOLN: 30.0000 mg | Freq: Four times a day (QID) | INTRAMUSCULAR | Status: DC

## 2012-11-03 MED ORDER — GLYCOPYRROLATE 0.2 MG/ML IJ SOLN
INTRAMUSCULAR | Status: DC | PRN
  Administered 2012-11-03: 0.6 mg via INTRAVENOUS

## 2012-11-03 MED ORDER — ACETAMINOPHEN 650 MG RE SUPP: 650.0000 mg | RECTAL | Status: DC | PRN

## 2012-11-03 MED ORDER — BUPIVACAINE HCL (PF) 0.5 % IJ SOLN
INTRAMUSCULAR | Status: DC | PRN
  Administered 2012-11-03: 18 mL

## 2012-11-03 MED ORDER — EPHEDRINE SULFATE 50 MG/ML IJ SOLN
INTRAMUSCULAR | Status: DC | PRN
  Administered 2012-11-03: 10 mg via INTRAVENOUS
  Administered 2012-11-03: 5 mg via INTRAVENOUS

## 2012-11-03 MED ORDER — DILTIAZEM HCL ER COATED BEADS 300 MG PO CP24
300.0000 mg | ORAL_CAPSULE | Freq: Every day | ORAL | Status: DC
  Filled 2012-11-03: qty 1

## 2012-11-03 MED ORDER — CEFAZOLIN SODIUM-DEXTROSE 2-3 GM-% IV SOLR
INTRAVENOUS | Status: DC | PRN
  Administered 2012-11-03: 2 g via INTRAVENOUS

## 2012-11-03 MED ORDER — SODIUM CHLORIDE 0.9 % IJ SOLN: 3.0000 mL | Freq: Two times a day (BID) | INTRAMUSCULAR | Status: DC

## 2012-11-03 MED ORDER — ACETAMINOPHEN 10 MG/ML IV SOLN
1000.0000 mg | Freq: Four times a day (QID) | INTRAVENOUS | Status: DC
  Administered 2012-11-03 – 2012-11-04 (×3): 1000 mg via INTRAVENOUS
  Filled 2012-11-03 (×5): qty 100

## 2012-11-03 MED ORDER — BISACODYL 10 MG RE SUPP: 10.0000 mg | Freq: Every day | RECTAL | Status: DC | PRN

## 2012-11-03 MED ORDER — CYCLOBENZAPRINE HCL 10 MG PO TABS: 10.0000 mg | ORAL_TABLET | Freq: Three times a day (TID) | ORAL | Status: DC | PRN

## 2012-11-03 MED ORDER — METHYLPREDNISOLONE ACETATE 80 MG/ML IJ SUSP
INTRAMUSCULAR | Status: DC | PRN
  Administered 2012-11-03: 12:00:00 via INTRA_ARTICULAR

## 2012-11-03 MED ORDER — LIDOCAINE HCL 4 % MT SOLN
OROMUCOSAL | Status: DC | PRN
  Administered 2012-11-03: 4 mL via TOPICAL

## 2012-11-03 MED ORDER — ALUM & MAG HYDROXIDE-SIMETH 200-200-20 MG/5ML PO SUSP: 30.0000 mL | Freq: Four times a day (QID) | ORAL | Status: DC | PRN

## 2012-11-03 MED ORDER — 0.9 % SODIUM CHLORIDE (POUR BTL) OPTIME
TOPICAL | Status: DC | PRN
  Administered 2012-11-03: 1000 mL

## 2012-11-03 MED ORDER — LIDOCAINE HCL (CARDIAC) 20 MG/ML IV SOLN
INTRAVENOUS | Status: DC | PRN
  Administered 2012-11-03: 70 mg via INTRAVENOUS

## 2012-11-03 MED ORDER — KETOROLAC TROMETHAMINE 15 MG/ML IJ SOLN
15.0000 mg | Freq: Four times a day (QID) | INTRAMUSCULAR | Status: DC
  Administered 2012-11-03 – 2012-11-04 (×3): 15 mg via INTRAVENOUS
  Filled 2012-11-03 (×7): qty 1

## 2012-11-03 MED ORDER — SODIUM CHLORIDE 0.9 % IV SOLN: 250.0000 mL | INTRAVENOUS | Status: DC

## 2012-11-03 MED ORDER — ONDANSETRON HCL 4 MG/2ML IJ SOLN: 4.0000 mg | Freq: Once | INTRAMUSCULAR | Status: DC | PRN

## 2012-11-03 MED ORDER — SODIUM CHLORIDE 0.9 % IR SOLN
Status: DC | PRN
  Administered 2012-11-03: 12:00:00

## 2012-11-03 MED ORDER — ACETAMINOPHEN 325 MG PO TABS: 650.0000 mg | ORAL_TABLET | ORAL | Status: DC | PRN

## 2012-11-03 MED ORDER — ARTIFICIAL TEARS OP OINT
TOPICAL_OINTMENT | OPHTHALMIC | Status: DC | PRN
  Administered 2012-11-03: 1 via OPHTHALMIC

## 2012-11-03 MED ORDER — FENTANYL CITRATE 0.05 MG/ML IJ SOLN
INTRAMUSCULAR | Status: DC | PRN
  Administered 2012-11-03: 25 ug via INTRAVENOUS

## 2012-11-03 MED ORDER — THROMBIN 5000 UNITS EX SOLR
CUTANEOUS | Status: DC | PRN
  Administered 2012-11-03 (×2): 5000 [IU] via TOPICAL

## 2012-11-03 MED ORDER — SODIUM CHLORIDE 0.9 % IV SOLN
INTRAVENOUS | Status: AC
  Filled 2012-11-03: qty 500

## 2012-11-03 MED ORDER — ACETAMINOPHEN 10 MG/ML IV SOLN
INTRAVENOUS | Status: AC
  Administered 2012-11-03: 1000 mg via INTRAVENOUS
  Filled 2012-11-03: qty 100

## 2012-11-03 MED ORDER — FENTANYL CITRATE 0.05 MG/ML IJ SOLN
INTRAMUSCULAR | Status: DC | PRN
  Administered 2012-11-03: 150 ug via INTRAVENOUS
  Administered 2012-11-03: 50 ug via INTRAVENOUS

## 2012-11-03 MED ORDER — MAGNESIUM HYDROXIDE 400 MG/5ML PO SUSP: 30.0000 mL | Freq: Every day | ORAL | Status: DC | PRN

## 2012-11-03 MED ORDER — ATORVASTATIN CALCIUM 40 MG PO TABS
40.0000 mg | ORAL_TABLET | Freq: Every day | ORAL | Status: DC
  Filled 2012-11-03 (×2): qty 1

## 2012-11-03 MED ORDER — ONDANSETRON HCL 4 MG/2ML IJ SOLN: 4.0000 mg | Freq: Four times a day (QID) | INTRAMUSCULAR | Status: DC | PRN

## 2012-11-03 MED ORDER — ZOLPIDEM TARTRATE 5 MG PO TABS: 5.0000 mg | ORAL_TABLET | Freq: Every evening | ORAL | Status: DC | PRN

## 2012-11-03 MED ORDER — BACITRACIN 50000 UNITS IM SOLR
INTRAMUSCULAR | Status: AC
  Filled 2012-11-03: qty 1

## 2012-11-03 MED ORDER — MENTHOL 3 MG MT LOZG: 1.0000 | LOZENGE | OROMUCOSAL | Status: DC | PRN

## 2012-11-03 MED ORDER — HYDROMORPHONE HCL PF 1 MG/ML IJ SOLN: 0.2500 mg | INTRAMUSCULAR | Status: DC | PRN

## 2012-11-03 MED ORDER — LACTATED RINGERS IV SOLN
INTRAVENOUS | Status: DC | PRN
  Administered 2012-11-03 (×2): via INTRAVENOUS

## 2012-11-03 MED ORDER — SODIUM CHLORIDE 0.9 % IJ SOLN: 3.0000 mL | INTRAMUSCULAR | Status: DC | PRN

## 2012-11-03 MED ORDER — HEMOSTATIC AGENTS (NO CHARGE) OPTIME
TOPICAL | Status: DC | PRN
  Administered 2012-11-03: 1 via TOPICAL

## 2012-11-03 MED ORDER — HYDROXYZINE HCL 25 MG PO TABS: 50.0000 mg | ORAL_TABLET | ORAL | Status: DC | PRN

## 2012-11-03 MED ORDER — FENTANYL CITRATE 0.05 MG/ML IJ SOLN
INTRAMUSCULAR | Status: AC
  Filled 2012-11-03: qty 2

## 2012-11-03 MED ORDER — PHENOL 1.4 % MT LIQD: 1.0000 | OROMUCOSAL | Status: DC | PRN

## 2012-11-03 MED ORDER — ROCURONIUM BROMIDE 100 MG/10ML IV SOLN
INTRAVENOUS | Status: DC | PRN
  Administered 2012-11-03: 50 mg via INTRAVENOUS

## 2012-11-03 MED ORDER — MORPHINE SULFATE 4 MG/ML IJ SOLN: 4.0000 mg | INTRAMUSCULAR | Status: DC | PRN

## 2012-11-03 SURGICAL SUPPLY — 65 items
BAG DECANTER FOR FLEXI CONT (MISCELLANEOUS) ×2 IMPLANT
BENZOIN TINCTURE PRP APPL 2/3 (GAUZE/BANDAGES/DRESSINGS) ×2 IMPLANT
BLADE SURG ROTATE 9660 (MISCELLANEOUS) IMPLANT
BRUSH SCRUB EZ PLAIN DRY (MISCELLANEOUS) ×2 IMPLANT
BUR ACORN 6.0 ACORN (BURR) IMPLANT
BUR ACRON 5.0MM COATED (BURR) IMPLANT
BUR MATCHSTICK NEURO 3.0 LAGG (BURR) ×2 IMPLANT
CANISTER SUCTION 2500CC (MISCELLANEOUS) ×2 IMPLANT
CLOTH BEACON ORANGE TIMEOUT ST (SAFETY) ×2 IMPLANT
CONT SPEC 4OZ CLIKSEAL STRL BL (MISCELLANEOUS) IMPLANT
DERMABOND ADHESIVE PROPEN (GAUZE/BANDAGES/DRESSINGS) ×1
DERMABOND ADVANCED (GAUZE/BANDAGES/DRESSINGS)
DERMABOND ADVANCED .7 DNX12 (GAUZE/BANDAGES/DRESSINGS) IMPLANT
DERMABOND ADVANCED .7 DNX6 (GAUZE/BANDAGES/DRESSINGS) ×1 IMPLANT
DRAPE LAPAROTOMY 100X72X124 (DRAPES) ×2 IMPLANT
DRAPE MICROSCOPE LEICA (MISCELLANEOUS) ×2 IMPLANT
DRAPE POUCH INSTRU U-SHP 10X18 (DRAPES) ×2 IMPLANT
DRAPE PROXIMA HALF (DRAPES) ×8 IMPLANT
DRSG EMULSION OIL 3X3 NADH (GAUZE/BANDAGES/DRESSINGS) IMPLANT
ELECT REM PT RETURN 9FT ADLT (ELECTROSURGICAL) ×2
ELECTRODE REM PT RTRN 9FT ADLT (ELECTROSURGICAL) ×1 IMPLANT
GAUZE SPONGE 4X4 16PLY XRAY LF (GAUZE/BANDAGES/DRESSINGS) IMPLANT
GLOVE BIOGEL PI IND STRL 7.0 (GLOVE) ×1 IMPLANT
GLOVE BIOGEL PI IND STRL 8 (GLOVE) ×1 IMPLANT
GLOVE BIOGEL PI INDICATOR 7.0 (GLOVE) ×1
GLOVE BIOGEL PI INDICATOR 8 (GLOVE) ×1
GLOVE ECLIPSE 7.5 STRL STRAW (GLOVE) ×2 IMPLANT
GLOVE ECLIPSE 8.5 STRL (GLOVE) ×2 IMPLANT
GLOVE EXAM NITRILE LRG STRL (GLOVE) IMPLANT
GLOVE EXAM NITRILE MD LF STRL (GLOVE) IMPLANT
GLOVE EXAM NITRILE XL STR (GLOVE) IMPLANT
GLOVE EXAM NITRILE XS STR PU (GLOVE) IMPLANT
GLOVE SURG SS PI 7.0 STRL IVOR (GLOVE) ×4 IMPLANT
GLOVE SURG SS PI 8.5 STRL IVOR (GLOVE) ×1
GLOVE SURG SS PI 8.5 STRL STRW (GLOVE) ×1 IMPLANT
GOWN BRE IMP SLV AUR LG STRL (GOWN DISPOSABLE) ×2 IMPLANT
GOWN BRE IMP SLV AUR XL STRL (GOWN DISPOSABLE) ×2 IMPLANT
GOWN PREVENTION PLUS XLARGE (GOWN DISPOSABLE) ×2 IMPLANT
GOWN STRL REIN 2XL LVL4 (GOWN DISPOSABLE) IMPLANT
KIT BASIN OR (CUSTOM PROCEDURE TRAY) ×2 IMPLANT
KIT ROOM TURNOVER OR (KITS) ×2 IMPLANT
NEEDLE HYPO 18GX1.5 BLUNT FILL (NEEDLE) IMPLANT
NEEDLE SPNL 18GX3.5 QUINCKE PK (NEEDLE) ×4 IMPLANT
NEEDLE SPNL 22GX3.5 QUINCKE BK (NEEDLE) ×2 IMPLANT
NS IRRIG 1000ML POUR BTL (IV SOLUTION) ×2 IMPLANT
PACK LAMINECTOMY NEURO (CUSTOM PROCEDURE TRAY) ×2 IMPLANT
PAD ARMBOARD 7.5X6 YLW CONV (MISCELLANEOUS) ×6 IMPLANT
PATTIES SURGICAL .5 X1 (DISPOSABLE) ×2 IMPLANT
RUBBERBAND STERILE (MISCELLANEOUS) ×4 IMPLANT
SPONGE GAUZE 4X4 12PLY (GAUZE/BANDAGES/DRESSINGS) ×4 IMPLANT
SPONGE LAP 4X18 X RAY DECT (DISPOSABLE) IMPLANT
SPONGE SURGIFOAM ABS GEL SZ50 (HEMOSTASIS) ×2 IMPLANT
STRIP CLOSURE SKIN 1/2X4 (GAUZE/BANDAGES/DRESSINGS) IMPLANT
SUT PROLENE 6 0 BV (SUTURE) IMPLANT
SUT VIC AB 1 CT1 18XBRD ANBCTR (SUTURE) ×1 IMPLANT
SUT VIC AB 1 CT1 8-18 (SUTURE) ×1
SUT VIC AB 2-0 CP2 18 (SUTURE) ×2 IMPLANT
SUT VIC AB 3-0 SH 8-18 (SUTURE) IMPLANT
SYR 20CC LL (SYRINGE) ×2 IMPLANT
SYR 5ML LL (SYRINGE) ×2 IMPLANT
TAPE CLOTH SURG 4X10 WHT LF (GAUZE/BANDAGES/DRESSINGS) ×2 IMPLANT
TAPE STRIPS DRAPE STRL (GAUZE/BANDAGES/DRESSINGS) ×2 IMPLANT
TOWEL OR 17X24 6PK STRL BLUE (TOWEL DISPOSABLE) ×2 IMPLANT
TOWEL OR 17X26 10 PK STRL BLUE (TOWEL DISPOSABLE) ×2 IMPLANT
WATER STERILE IRR 1000ML POUR (IV SOLUTION) ×2 IMPLANT

## 2012-11-03 NOTE — Transfer of Care (Signed)
Immediate Anesthesia Transfer of Care Note  Patient: [name]  Procedure(s) Performed: Procedure(s) with comments: LUMBAR LAMINECTOMY/DECOMPRESSION MICRODISCECTOMY 1 LEVEL (Left) - LEFT L5S1 laminotomy foraminotomy and possible microdiskectomy  Patient Location: PACU  Anesthesia Type:General  Level of Consciousness: awake, alert  and oriented  Airway & Oxygen Therapy: Patient Spontanous Breathing and Patient connected to nasal cannula oxygen  Post-op Assessment: Report given to PACU RN, Post -op Vital signs reviewed and stable and Patient moving all extremities X 4  Post vital signs: Reviewed and stable  Complications: No apparent anesthesia complications

## 2012-11-03 NOTE — Op Note (Signed)
11/03/2012  12:58 PM  PATIENT:  [name]  79 y.o. male  PRE-OPERATIVE DIAGNOSIS:  lumbar stenosis, lumbar spondylosis, lumbar degenerative disc disease, lumbar radiculopathy  POST-OPERATIVE DIAGNOSIS:  lumbar stenosis, lumbar spondylosis, lumbar degenerative disc disease, lumbar radiculopathy, left L5 pars interarticularis defect  PROCEDURE:  Procedure(s): LUMBAR LAMINECTOMY/DECOMPRESSION 1 LEVEL:  Left L5 Gill procedure, superior left S1 hemilaminotomy, and foraminotomies for the exiting left L5 and S1 nerve roots, with decompression of the stenotic compression of the thecal sac and the exiting left L5 and S1 nerve roots, with micro-resection, microsurgical technique, and the operating microscope  SURGEON:  Surgeon(s): Hewitt Shorts, MD Barnett Abu, MD  ASSISTANTS:  Barnett Abu, MD3  ANESTHESIA:   general  EBL:  Total I/O In: 1600 [I.V.:1600] Out: 50 [Blood:50]  BLOOD ADMINISTERED:none  COUNT:  Correct per nursing staff  DICTATION: Patient was brought to the operating room and placed under general endotracheal anesthesia. Patient was turned to prone position the lumbar region was prepped with Betadine soap and solution and draped in a sterile fashion. The midline was infiltrated with local anesthetic with epinephrine. A localizing x-ray was taken and the L5-S1 level was identified. Midline incision was made over the L5-S1 level and was carried down through the subcutaneous tissue to the lumbar fascia. The lumbar fascia was incised on the left side and the paraspinal muscles were dissected from the spinous processes and lamina in a subperiosteal fashion. Additional x-rays were taken and the L5-S1 intralaminar space was identified. The operating microscope was draped and brought into the field provided additional magnification, illumination, and visualization. The remainder of the decompression was performed using microdissection and microsurgical technique. A left L5  hemi-laminotomy was performed using the high-speed drill and Kerrison punches. The ligamentum flavum was carefully resected. There was significant hypertrophic spondylitic overgrowth, that was causing compression of the thecal sac. We first identified the thecal sac. As the L5 hemilaminectomy was performed, it was recognized that there was a L5 pars defect and that the inferior articular process was loose. It was removed, and we continued the decompression, performing a complete left L5 hemi-laminectomy. We identified the exiting S1 nerve root and carefully removed the spondylitic overgrowth compressing the exiting nerve root. We identified the L5-S1 annulus, there was a significant anterolisthesis of L5 relative to S1, with an associated broad based L5-S1 pseudo-disc protrusion. We then carefully removed the pseudoarthrosis associated with the left L5 pars defect, decompressing the stenotic compression of the exiting left L5 nerve root. In the end it was felt that performing an L5-S1 discectomy would not improve the extent of decompression, and would likely lead to greater instability, and therefore was not performed. Once good decompression of the thecal sac and  Left L5 and S1 nerve roots had been achieved, hemostasis was established with the use of bipolar cautery and Gelfoam with thrombin. The Gelfoam was removed and hemostasis confirmed. We then instilled 2 cc of fentanyl and 80 mg of Depo-Medrol into the epidural space. Deep fascia was closed with interrupted undyed 1 Vicryl sutures. Scarpa's fascia was closed with interrupted undyed 1 Vicryl sutures in the subcutaneous and subcuticular layer were closed with interrupted inverted 2-0 undyed Vicryl sutures. The skin edges were approximated with Dermabond. A dressing of dry gauze and Hypafix was applied. Following surgery the patient was turned back to a supine position to be reversed from the anesthetic extubated and transferred to the recovery room for  further care.   PLAN OF CARE: Admit for overnight  observation  PATIENT DISPOSITION:  PACU - hemodynamically stable.   Delay start of Pharmacological VTE agent (>24hrs) due to surgical blood loss or risk of bleeding:  yes

## 2012-11-03 NOTE — H&P (Signed)
Subjective: Patient is a 79 y.o. male who is admitted for treatment of her left lumbar radiculopathy secondary to left L5-S1 lateral recess and neural foraminal stenosis, contributed to by facet and ligamentum flavum hypertrophy, as well as to the disc bulging. He describes disabling left lumbar radicular pain from the left side of his low back, through the left buttock, particularly to the left posterior left thigh and calf, the lateral left ankle. Notably the patient is about 1 month status post a right C7-T1 cervical thoracic laminotomy, facetectomy, foraminotomy with decompression of the exiting right C8 nerve root, and bilateral C7-T1 posterior cervical thoracic arthrodesis with posterior instrumentation and bone graft. Patient is now admitted for a left L5-S1 lumbar laminotomy, foraminotomy, and possible microdiscectomy.    Patient Active Problem List   Diagnosis Date Noted  . Right elbow pain 07/18/2012  . GOUT, UNSPECIFIED 02/20/2010  . ERECTILE DYSFUNCTION, ORGANIC 02/20/2010  . KNEE PAIN 10/10/2009  . PATELLAR TENDINITIS 10/10/2009  . HYPERLIPIDEMIA, WITH HIGH HDL 10/18/2008  . CARPAL TUNNEL SYNDROME, LEFT 04/13/2008  . ACTINIC KERATOSIS, HEAD 03/23/2008  . BENIGN PROSTATIC HYPERTROPHY, WITH URINARY OBSTRUCTION 10/16/2007  . CAD 08/18/2007  . UNS ADVRS EFF UNS RX MEDICINAL&BIOLOGICAL SBSTNC 08/18/2007  . NEOPLASM, MALIGNANT, SKIN, TRUNK 05/09/2007  . HYPERTENSION 05/09/2007  . ALLERGIC RHINITIS 03/27/2007  . LOW BACK PAIN 03/27/2007  . SKIN CANCER, HX OF 03/27/2007   Past Medical History  Diagnosis Date  . ACTINIC KERATOSIS, HEAD 03/23/2008  . ALLERGIC RHINITIS 03/27/2007  . BENIGN PROSTATIC HYPERTROPHY, WITH URINARY OBSTRUCTION 10/16/2007  . CARPAL TUNNEL SYNDROME, LEFT 04/13/2008  . ERECTILE DYSFUNCTION, ORGANIC 02/20/2010  . Gout, unspecified 02/20/2010  . HYPERLIPIDEMIA, WITH HIGH HDL 10/18/2008  . KNEE PAIN 10/10/2009  . LOW BACK PAIN 03/27/2007  . NEOPLASM, MALIGNANT, SKIN,  TRUNK 05/09/2007  . Patellar tendinitis 10/10/2009  . SKIN CANCER, HX OF 03/27/2007  . UNS ADVRS EFF UNS RX MEDICINAL&BIOLOGICAL SBSTNC 08/18/2007  . HYPERTENSION 05/09/2007    sees Dr. Darryll Capers  . Headache     migraines hx of  . Chronic neck pain   . Chronic back pain   . CAD 08/18/2007    "patient denies any issues with heart, does not see cardiologist"  . Urinary urgency     Past Surgical History  Procedure Laterality Date  . Knee arthroscopy    . Cholecystectomy    . Nasal sinus surgery    . Vasectomy    . Lumbar fusions    . Joint replacement surgery    . Rectal fissure    . Lateral fusion lumbar spine, transverse    . Laminectomy    . Lumbar fusion    . Rectal fissure    . Vitrectomy      right and left 2013  . Carpal tunnel release    . Eye surgery      bilateral cataracts removed and vitrectomies  . Blepharoplasty    . Back surgery    . Posterior cervical fusion/foraminotomy Right 10/01/2012    Procedure: POSTERIOR CERVICAL FUSION/FORAMINOTOMY LEVEL 1;  Surgeon: Hewitt Shorts, MD;  Location: [facility];  Service: Neurosurgery;  Laterality: Right;  Right Cervical seven thoracic one Cervical laminectomy/foraminotomy with posterior cervical arthrodesis   . Joint replacement Right     knee  . Appendectomy  1969    Prescriptions prior to admission  Medication Sig Dispense Refill  . diltiazem (CARDIZEM CD) 300 MG 24 hr capsule Take 300 mg by mouth daily.      Marland Kitchen  oxyCODONE-acetaminophen (PERCOCET/ROXICET) 5-325 MG per tablet Take 1-2 tablets by mouth every 4 (four) hours as needed for pain.  60 tablet  0  . rosuvastatin (CRESTOR) 20 MG tablet Take 20 mg by mouth once a week.       . telmisartan (MICARDIS) 80 MG tablet Take 1 tablet (80 mg total) by mouth daily.      Marland Kitchen triamcinolone cream (KENALOG) 0.5 % Apply 1 application topically 2 (two) times daily as needed (for rash).       No Known Allergies  History  Substance Use Topics  . Smoking status: Former Smoker     Quit date: 09/29/1981  . Smokeless tobacco: Not on file  . Alcohol Use: 1.5 oz/week    3 drink(s) per week     Comment: daily    Family History  Problem Relation Age of Onset  . Dementia Mother   . Anemia Father   . Heart disease Father      Review of Systems A comprehensive review of systems was negative.  Objective: Vital signs in last 24 hours: Temp:  [98.2 F (36.8 C)] 98.2 F (36.8 C) (03/24 0719) Pulse Rate:  [83] 83 (03/24 0719) Resp:  [20] 20 (03/24 0719) BP: (168)/(78) 168/78 mmHg (03/24 0719) SpO2:  [95 %] 95 % (03/24 0719)  EXAM: Patient is a well-developed well-nourished white male in no acute distress. Lungs are clear to auscultation , the patient has symmetrical respiratory excursion. Heart has a regular rate and rhythm normal S1 and S2 no murmur.   Abdomen is soft nontender nondistended bowel sounds are present. Extremity examination shows no clubbing cyanosis or edema. Motor examination shows 5 over 5 strength in the lower extremities including the iliopsoas quadriceps dorsiflexor extensor hallicus  longus and plantar flexor bilaterally. Sensation is intact to pinprick in the distal lower extremities. Reflexes are symmetrical bilaterally. No pathologic reflexes are present. Patient has a normal gait and stance.    Data Review:CBC    Component Value Date/Time   WBC 6.9 10/29/2012 1121   RBC 4.91 10/29/2012 1121   HGB 14.9 10/29/2012 1121   HCT 43.1 10/29/2012 1121   PLT 351 10/29/2012 1121   MCV 87.8 10/29/2012 1121   MCH 30.3 10/29/2012 1121   MCHC 34.6 10/29/2012 1121   RDW 13.9 10/29/2012 1121   LYMPHSABS 3.1 08/12/2012 1129   MONOABS 0.8 08/12/2012 1129   EOSABS 0.3 08/12/2012 1129   BASOSABS 0.1 08/12/2012 1129                          BMET    Component Value Date/Time   NA 135 10/29/2012 1121   K 4.4 10/29/2012 1121   CL 99 10/29/2012 1121   CO2 27 10/29/2012 1121   GLUCOSE 97 10/29/2012 1121   BUN 17 10/29/2012 1121   CREATININE 0.95 10/29/2012 1121    CALCIUM 9.6 10/29/2012 1121   GFRNONAA 77* 10/29/2012 1121   GFRAA 89* 10/29/2012 1121     Assessment/Plan: Patient with the disabling left lumbar radiculopathy secondary to left L5-S1 lateral recess and neural foramina stenosis, admitted for left L5-S1 lumbar laminotomy, foraminotomy, and possible micro-discectomy.  I've discussed with the patient the nature of his condition, the nature the surgical procedure, the typical length of surgery, hospital stay, and overall recuperation. We discussed limitations postoperatively. I discussed risks of surgery including risks of infection, bleeding, possibly need for transfusion, the risk of nerve root dysfunction with pain, weakness, numbness,  or paresthesias, or risk of dural tear and CSF leakage and possible need for further surgery, the risk of recurrent disc herniation and the possible need for further surgery, and the risk of anesthetic complications including myocardial infarction, stroke, pneumonia, and death. Understanding all this the patient does wish to proceed with surgery and is admitted for such.    Hewitt Shorts, MD 11/03/2012 8:52 AM

## 2012-11-03 NOTE — Anesthesia Postprocedure Evaluation (Signed)
  Anesthesia Post-op Note  Patient: [name]  Procedure(s) Performed: Procedure(s) with comments: LUMBAR LAMINECTOMY/DECOMPRESSION MICRODISCECTOMY 1 LEVEL (Left) - LEFT L5S1 laminotomy foraminotomy and possible microdiskectomy  Patient Location: PACU  Anesthesia Type:General  Level of Consciousness: awake, alert , oriented and patient cooperative  Airway and Oxygen Therapy: Patient Spontanous Breathing  Post-op Pain: mild  Post-op Assessment: Post-op Vital signs reviewed, Patient's Cardiovascular Status Stable, Respiratory Function Stable, Patent Airway, No signs of Nausea or vomiting and Pain level controlled  Post-op Vital Signs: stable  Complications: No apparent anesthesia complications

## 2012-11-03 NOTE — Progress Notes (Signed)
Filed Vitals:   11/03/12 1315 11/03/12 1330 11/03/12 1409 11/03/12 1611  BP: 143/62 145/67 165/73 158/74  Pulse:   64 62  Temp:  97.6 F (36.4 C) 97.6 F (36.4 C) 98 F (36.7 C)  TempSrc:      Resp:   18 16  SpO2:   94% 96%    Patient up and ambulating in halls. Dressing clean and dry. Voiding. Moving all 4 extremities  well. Fitted for a extra large Aspen quick-draw with an abdominal support.  Plan: We'll continue to progress to postoperative recovery.  Hewitt Shorts, MD 11/03/2012, 4:50 PM

## 2012-11-03 NOTE — Anesthesia Preprocedure Evaluation (Addendum)
Anesthesia Evaluation  Patient identified by MRN, date of birth, ID band Patient awake    Reviewed: Allergy & Precautions, H&P , NPO status , Patient's Chart, lab work & pertinent test results  Airway Mallampati: I TM Distance: >3 FB Neck ROM: full    Dental  (+) Teeth Intact, Caps and Dental Advisory Given   Pulmonary          Cardiovascular hypertension, + CAD and + Peripheral Vascular Disease Rhythm:regular Rate:Normal     Neuro/Psych  Headaches, PSYCHIATRIC DISORDERS    GI/Hepatic   Endo/Other    Renal/GU      Musculoskeletal   Abdominal   Peds  Hematology   Anesthesia Other Findings   Reproductive/Obstetrics                          Anesthesia Physical Anesthesia Plan  ASA: III  Anesthesia Plan: General   Post-op Pain Management:    Induction: Intravenous  Airway Management Planned: Oral ETT  Additional Equipment:   Intra-op Plan:   Post-operative Plan: Extubation in OR  Informed Consent: I have reviewed the patients History and Physical, chart, labs and discussed the procedure including the risks, benefits and alternatives for the proposed anesthesia with the patient or authorized representative who has indicated his/her understanding and acceptance.     Plan Discussed with: CRNA, Anesthesiologist and Surgeon  Anesthesia Plan Comments:         Anesthesia Quick Evaluation

## 2012-11-04 ENCOUNTER — Encounter (HOSPITAL_COMMUNITY): Payer: Self-pay | Admitting: Neurosurgery

## 2012-11-04 DIAGNOSIS — I10 Essential (primary) hypertension: Secondary | ICD-10-CM | POA: Diagnosis not present

## 2012-11-04 DIAGNOSIS — M5137 Other intervertebral disc degeneration, lumbosacral region: Secondary | ICD-10-CM | POA: Diagnosis not present

## 2012-11-04 DIAGNOSIS — Z79899 Other long term (current) drug therapy: Secondary | ICD-10-CM | POA: Diagnosis not present

## 2012-11-04 DIAGNOSIS — M47817 Spondylosis without myelopathy or radiculopathy, lumbosacral region: Secondary | ICD-10-CM | POA: Diagnosis not present

## 2012-11-04 DIAGNOSIS — Z01812 Encounter for preprocedural laboratory examination: Secondary | ICD-10-CM | POA: Diagnosis not present

## 2012-11-04 MED ORDER — HYDROCODONE-ACETAMINOPHEN 5-325 MG PO TABS: 1.0000 | ORAL_TABLET | ORAL | Status: DC | PRN

## 2012-11-04 NOTE — Discharge Summary (Signed)
Physician Discharge Summary  Patient ID: [name] MRN: [id number] DOB/AGE: 1931/11/27 79 y.o.  Admit date: 11/03/2012 Discharge date: 11/04/2012  Admission Diagnoses:  Lumbar stenosis, lumbar cirrhosis, lumbar degenerative disc disease, left L5 pars interarticularis defect, left lumbar radiculopathy  Discharge Diagnoses:  Lumbar stenosis, lumbar cirrhosis, lumbar degenerative disc disease, left L5 pars interarticularis defect, left lumbar radiculopathy  Active Problems:   * No active hospital problems. *  Discharged Condition: good  Hospital Course:  Patient was admitted, underwent a left L5 Gill procedure, severe left S1 hemilaminotomy, and foraminotomies for the exiting left L5 and S1 nerve roots. Postoperatively is done well with significant improvement in his left lumbar radiculopathy. He is up and living actively in the halls. His wound is healing nicely. He is asking to be discharged to home. He's been given instructions regarding wound care and activities. He is to return for followup with me in 3 weeks.  Discharge Exam: Blood pressure 170/70, pulse 87, temperature 99.7 F (37.6 C), temperature source Oral, resp. rate 16, SpO2 93.00%.  Disposition:  Home   Future Appointments Provider Department Dept Phone   12/15/2012 1:45 PM Stacie Glaze, MD [facility] [phone]       Medication List    TAKE these medications       diltiazem 300 MG 24 hr capsule  Commonly known as:  CARDIZEM CD  Take 300 mg by mouth daily.     HYDROcodone-acetaminophen 5-325 MG per tablet  Commonly known as:  NORCO/VICODIN  Take 1-2 tablets by mouth every 4 (four) hours as needed for pain.     oxyCODONE-acetaminophen 5-325 MG per tablet  Commonly known as:  PERCOCET/ROXICET  Take 1-2 tablets by mouth every 4 (four) hours as needed for pain.     rosuvastatin 20 MG tablet  Commonly known as:  CRESTOR  Take 20 mg by mouth once a week.     telmisartan 80 MG tablet   Commonly known as:  MICARDIS  Take 1 tablet (80 mg total) by mouth daily.     triamcinolone cream 0.5 %  Commonly known as:  KENALOG  Apply 1 application topically 2 (two) times daily as needed (for rash).         Signed: Hewitt Shorts, MD 11/04/2012, 8:43 AM

## 2012-11-04 NOTE — Progress Notes (Signed)
Pt given D/C instructions with Rx, verbal understanding given. Pt D/C'd home via wheelchair with sister @ 0930 per MD order. Rema Fendt, RN

## 2012-12-15 ENCOUNTER — Ambulatory Visit (INDEPENDENT_AMBULATORY_CARE_PROVIDER_SITE_OTHER): Payer: Medicare Other | Admitting: Internal Medicine

## 2012-12-15 ENCOUNTER — Encounter: Payer: Self-pay | Admitting: Internal Medicine

## 2012-12-15 VITALS — BP 140/80 | HR 76 | Temp 98.3°F | Resp 16 | Ht 72.0 in | Wt 203.0 lb

## 2012-12-15 DIAGNOSIS — N138 Other obstructive and reflux uropathy: Secondary | ICD-10-CM | POA: Diagnosis not present

## 2012-12-15 DIAGNOSIS — T148XXA Other injury of unspecified body region, initial encounter: Secondary | ICD-10-CM

## 2012-12-15 DIAGNOSIS — I10 Essential (primary) hypertension: Secondary | ICD-10-CM

## 2012-12-15 DIAGNOSIS — N401 Enlarged prostate with lower urinary tract symptoms: Secondary | ICD-10-CM | POA: Diagnosis not present

## 2012-12-15 DIAGNOSIS — IMO0002 Reserved for concepts with insufficient information to code with codable children: Secondary | ICD-10-CM

## 2012-12-15 DIAGNOSIS — G8929 Other chronic pain: Secondary | ICD-10-CM | POA: Insufficient documentation

## 2012-12-15 MED ORDER — TERAZOSIN HCL 5 MG PO CAPS: 5.0000 mg | ORAL_CAPSULE | Freq: Every day | ORAL | Status: DC

## 2012-12-15 MED ORDER — TELMISARTAN 80 MG PO TABS: 80.0000 mg | ORAL_TABLET | Freq: Every day | ORAL | Status: DC

## 2012-12-15 MED ORDER — FOLIC ACID-VIT B6-VIT B12 2.3-24.5-2 MG PO TABS: 1.0000 | ORAL_TABLET | Freq: Two times a day (BID) | ORAL | Status: DC

## 2012-12-15 MED ORDER — GABAPENTIN 300 MG PO CAPS: 300.0000 mg | ORAL_CAPSULE | Freq: Three times a day (TID) | ORAL | Status: DC

## 2012-12-15 NOTE — Patient Instructions (Signed)
Start with one capsule at bedtime for 2 weeks the one twice a da for two weeks the one three times a day

## 2012-12-15 NOTE — Progress Notes (Signed)
Subjective:    Patient ID: [name], male    DOB: October 02, 1931, 79 y.o.   MRN: [id number]  HPI  Patient is 79 year old male who presents for followup of hypertension hyperlipidemia and presents with chronic leg pain primarily down his left leg he states that he feels pain radiating into the block and to the calf of the left leg.  Patient had back surgery with Dr. Newell Coral in 11/03/2012.  S1 hemi-lamenectomy and foraminotomy   Review of Systems  Constitutional: Negative for fever and fatigue.  HENT: Negative for hearing loss, congestion, neck pain and postnasal drip.   Eyes: Negative for discharge, redness and visual disturbance.  Respiratory: Negative for cough, shortness of breath and wheezing.   Cardiovascular: Negative for leg swelling.  Gastrointestinal: Negative for abdominal pain, constipation and abdominal distention.  Genitourinary: Positive for urgency and frequency.  Musculoskeletal: Positive for myalgias, back pain and joint swelling. Negative for arthralgias.  Skin: Negative for color change and rash.  Neurological: Negative for light-headedness.  Hematological: Negative for adenopathy.  Psychiatric/Behavioral: Negative for behavioral problems.   Past Medical History  Diagnosis Date  . ACTINIC KERATOSIS, HEAD 03/23/2008  . ALLERGIC RHINITIS 03/27/2007  . BENIGN PROSTATIC HYPERTROPHY, WITH URINARY OBSTRUCTION 10/16/2007  . CARPAL TUNNEL SYNDROME, LEFT 04/13/2008  . ERECTILE DYSFUNCTION, ORGANIC 02/20/2010  . Gout, unspecified 02/20/2010  . HYPERLIPIDEMIA, WITH HIGH HDL 10/18/2008  . KNEE PAIN 10/10/2009  . LOW BACK PAIN 03/27/2007  . NEOPLASM, MALIGNANT, SKIN, TRUNK 05/09/2007  . Patellar tendinitis 10/10/2009  . SKIN CANCER, HX OF 03/27/2007  . UNS ADVRS EFF UNS RX MEDICINAL&BIOLOGICAL SBSTNC 08/18/2007  . HYPERTENSION 05/09/2007    sees Dr. Darryll Capers  . Headache     migraines hx of  . Chronic neck pain   . Chronic back pain   . CAD 08/18/2007    "patient denies any  issues with heart, does not see cardiologist"  . Urinary urgency     History   Social History  . Marital Status: Married    Spouse Name: N/A    Number of Children: N/A  . Years of Education: N/A   Occupational History  . Not on file.   Social History Main Topics  . Smoking status: Former Smoker    Quit date: 09/29/1981  . Smokeless tobacco: Not on file  . Alcohol Use: 1.5 oz/week    3 drink(s) per week     Comment: daily  . Drug Use: No  . Sexually Active: Yes   Other Topics Concern  . Not on file   Social History Narrative  . No narrative on file    Past Surgical History  Procedure Laterality Date  . Knee arthroscopy    . Cholecystectomy    . Nasal sinus surgery    . Vasectomy    . Lumbar fusions    . Joint replacement surgery    . Rectal fissure    . Lateral fusion lumbar spine, transverse    . Laminectomy    . Lumbar fusion    . Rectal fissure    . Vitrectomy      right and left 2013  . Carpal tunnel release    . Eye surgery      bilateral cataracts removed and vitrectomies  . Blepharoplasty    . Back surgery    . Posterior cervical fusion/foraminotomy Right 10/01/2012    Procedure: POSTERIOR CERVICAL FUSION/FORAMINOTOMY LEVEL 1;  Surgeon: Hewitt Shorts, MD;  Location: [facility];  Service:  Neurosurgery;  Laterality: Right;  Right Cervical seven thoracic one Cervical laminectomy/foraminotomy with posterior cervical arthrodesis   . Joint replacement Right     knee  . Appendectomy  1969  . Lumbar laminectomy/decompression microdiscectomy Left 11/03/2012    Procedure: LUMBAR LAMINECTOMY/DECOMPRESSION MICRODISCECTOMY 1 LEVEL;  Surgeon: Hewitt Shorts, MD;  Location: [facility];  Service: Neurosurgery;  Laterality: Left;  LEFT L5S1 laminotomy foraminotomy and possible microdiskectomy    Family History  Problem Relation Age of Onset  . Dementia Mother   . Anemia Father   . Heart disease Father     No Known Allergies  Current Outpatient  Prescriptions on File Prior to Visit  Medication Sig Dispense Refill  . diltiazem (CARDIZEM CD) 300 MG 24 hr capsule Take 300 mg by mouth daily.      Marland Kitchen oxyCODONE-acetaminophen (PERCOCET/ROXICET) 5-325 MG per tablet Take 1-2 tablets by mouth every 4 (four) hours as needed for pain.  60 tablet  0  . rosuvastatin (CRESTOR) 20 MG tablet Take 20 mg by mouth once a week.       . triamcinolone cream (KENALOG) 0.5 % Apply 1 application topically 2 (two) times daily as needed (for rash).      Marland Kitchen HYDROcodone-acetaminophen (NORCO/VICODIN) 5-325 MG per tablet Take 1-2 tablets by mouth every 4 (four) hours as needed for pain.  60 tablet  0   No current facility-administered medications on file prior to visit.    BP 140/80  Pulse 76  Temp(Src) 98.3 F (36.8 C)  Resp 16  Ht 6' (1.829 m)  Wt 203 lb (92.08 kg)  BMI 27.53 kg/m2        Objective:   Physical Exam  Nursing note and vitals reviewed. Constitutional: He appears well-developed and well-nourished.  HENT:  Head: Normocephalic and atraumatic.  Eyes: Conjunctivae are normal. Pupils are equal, round, and reactive to light.  Neck: Normal range of motion. Neck supple.  Cardiovascular: Normal rate.   Pulmonary/Chest: Effort normal and breath sounds normal.  Abdominal: Soft. Bowel sounds are normal.  Musculoskeletal: He exhibits edema and tenderness.  Psychiatric: He has a normal mood and affect. His behavior is normal.          Assessment & Plan:  Chronic nerve damage Post narcotic constipation form the pain medications   Trial B1B6 B12  And  Gabapentin Patient has overactive bladder bladder with frequency  With  benign prostatic hypertrophy and retention. As we discontinued the diuretic  We will add Hytrin which could help [place] with better blood pressure control as well as to aid in the frequency at night

## 2012-12-17 ENCOUNTER — Encounter: Payer: Self-pay | Admitting: Internal Medicine

## 2012-12-18 ENCOUNTER — Encounter: Payer: Self-pay | Admitting: Internal Medicine

## 2012-12-19 ENCOUNTER — Other Ambulatory Visit: Payer: Self-pay | Admitting: *Deleted

## 2012-12-19 NOTE — Telephone Encounter (Signed)
Hydrocodone called in 

## 2012-12-22 ENCOUNTER — Other Ambulatory Visit: Payer: Self-pay | Admitting: *Deleted

## 2012-12-22 ENCOUNTER — Encounter: Payer: Self-pay | Admitting: Internal Medicine

## 2012-12-22 DIAGNOSIS — I10 Essential (primary) hypertension: Secondary | ICD-10-CM

## 2012-12-22 MED ORDER — FUROSEMIDE 20 MG PO TABS: 20.0000 mg | ORAL_TABLET | Freq: Every day | ORAL | Status: DC

## 2012-12-22 MED ORDER — TELMISARTAN 80 MG PO TABS: 40.0000 mg | ORAL_TABLET | Freq: Every day | ORAL | Status: DC

## 2012-12-25 ENCOUNTER — Encounter: Payer: Self-pay | Admitting: Internal Medicine

## 2012-12-26 ENCOUNTER — Encounter: Payer: Self-pay | Admitting: Internal Medicine

## 2012-12-26 ENCOUNTER — Other Ambulatory Visit: Payer: Self-pay | Admitting: *Deleted

## 2012-12-26 ENCOUNTER — Other Ambulatory Visit (INDEPENDENT_AMBULATORY_CARE_PROVIDER_SITE_OTHER): Payer: Medicare Other

## 2012-12-26 DIAGNOSIS — R0989 Other specified symptoms and signs involving the circulatory and respiratory systems: Secondary | ICD-10-CM

## 2012-12-26 DIAGNOSIS — R0609 Other forms of dyspnea: Secondary | ICD-10-CM

## 2012-12-26 DIAGNOSIS — R0600 Dyspnea, unspecified: Secondary | ICD-10-CM

## 2012-12-26 DIAGNOSIS — R609 Edema, unspecified: Secondary | ICD-10-CM

## 2012-12-26 DIAGNOSIS — R109 Unspecified abdominal pain: Secondary | ICD-10-CM | POA: Diagnosis not present

## 2012-12-26 LAB — CREATININE KINASE MB: CK-MB: 4 ng/mL (ref 0.3–4.0)

## 2012-12-26 LAB — BASIC METABOLIC PANEL
BUN: 12 mg/dL (ref 6–23)
CO2: 27 mEq/L (ref 19–32)
Calcium: 9.2 mg/dL (ref 8.4–10.5)
Chloride: 103 mEq/L (ref 96–112)
Creatinine, Ser: 1.1 mg/dL (ref 0.4–1.5)
GFR: 69.3 mL/min (ref >60.00)
Glucose, Bld: 98 mg/dL (ref 70–99)
Potassium: 4 mEq/L (ref 3.5–5.1)
Sodium: 137 mEq/L (ref 135–145)

## 2012-12-26 LAB — CBC WITH DIFFERENTIAL/PLATELET
Basophils Absolute: 0 10*3/uL (ref 0.0–0.1)
Basophils Relative: 0.5 % (ref 0.0–3.0)
Eosinophils Absolute: 0.2 10*3/uL (ref 0.0–0.7)
Eosinophils Relative: 2.8 % (ref 0.0–5.0)
HCT: 40.6 % (ref 39.0–52.0)
Hemoglobin: 13.7 g/dL (ref 13.0–17.0)
Lymphocytes Relative: 26.1 % (ref 12.0–46.0)
Lymphs Abs: 1.6 10*3/uL (ref 0.7–4.0)
MCHC: 33.8 g/dL (ref 30.0–36.0)
MCV: 89.6 fl (ref 78.0–100.0)
Monocytes Absolute: 0.5 10*3/uL (ref 0.1–1.0)
Monocytes Relative: 8.4 % (ref 3.0–12.0)
Neutro Abs: 3.8 10*3/uL (ref 1.4–7.7)
Neutrophils Relative %: 62.2 % (ref 43.0–77.0)
Platelets: 393 10*3/uL (ref 150.0–400.0)
RBC: 4.54 Mil/uL (ref 4.22–5.81)
RDW: 15.4 % — ABNORMAL HIGH (ref 11.5–14.6)
WBC: 6.2 10*3/uL (ref 4.5–10.5)

## 2012-12-26 LAB — BRAIN NATRIURETIC PEPTIDE: Pro B Natriuretic peptide (BNP): 145 pg/mL — ABNORMAL HIGH (ref 0.0–100.0)

## 2012-12-26 NOTE — Addendum Note (Signed)
Addended by: Rita Ohara R on: 12/26/2012 11:08 AM   Modules accepted: Orders

## 2013-01-06 ENCOUNTER — Encounter: Payer: Self-pay | Admitting: Internal Medicine

## 2013-01-07 ENCOUNTER — Ambulatory Visit (HOSPITAL_COMMUNITY): Payer: Medicare Other | Attending: Internal Medicine | Admitting: Radiology

## 2013-01-07 DIAGNOSIS — R0602 Shortness of breath: Secondary | ICD-10-CM | POA: Diagnosis not present

## 2013-01-07 DIAGNOSIS — R0609 Other forms of dyspnea: Secondary | ICD-10-CM

## 2013-01-07 DIAGNOSIS — R609 Edema, unspecified: Secondary | ICD-10-CM

## 2013-01-07 DIAGNOSIS — R0989 Other specified symptoms and signs involving the circulatory and respiratory systems: Secondary | ICD-10-CM | POA: Diagnosis not present

## 2013-01-07 DIAGNOSIS — R0600 Dyspnea, unspecified: Secondary | ICD-10-CM

## 2013-01-07 NOTE — Progress Notes (Signed)
Echocardiogram performed.  

## 2013-01-15 ENCOUNTER — Encounter: Payer: Self-pay | Admitting: Internal Medicine

## 2013-01-16 ENCOUNTER — Other Ambulatory Visit: Payer: Self-pay | Admitting: *Deleted

## 2013-01-16 MED ORDER — OXYCODONE-ACETAMINOPHEN 10-325 MG PO TABS: 1.0000 | ORAL_TABLET | Freq: Two times a day (BID) | ORAL | Status: DC | PRN

## 2013-01-19 ENCOUNTER — Other Ambulatory Visit: Payer: Self-pay | Admitting: Internal Medicine

## 2013-02-03 DIAGNOSIS — H43819 Vitreous degeneration, unspecified eye: Secondary | ICD-10-CM | POA: Diagnosis not present

## 2013-02-03 DIAGNOSIS — H35069 Retinal vasculitis, unspecified eye: Secondary | ICD-10-CM | POA: Diagnosis not present

## 2013-02-03 DIAGNOSIS — H43399 Other vitreous opacities, unspecified eye: Secondary | ICD-10-CM | POA: Diagnosis not present

## 2013-02-03 DIAGNOSIS — H35079 Retinal telangiectasis, unspecified eye: Secondary | ICD-10-CM | POA: Diagnosis not present

## 2013-02-10 ENCOUNTER — Encounter: Payer: Self-pay | Admitting: Internal Medicine

## 2013-02-10 ENCOUNTER — Other Ambulatory Visit: Payer: Self-pay | Admitting: Internal Medicine

## 2013-02-10 MED ORDER — HYDROCODONE-ACETAMINOPHEN 5-325 MG PO TABS: 1.0000 | ORAL_TABLET | ORAL | Status: DC | PRN

## 2013-02-12 ENCOUNTER — Other Ambulatory Visit: Payer: Self-pay | Admitting: Dermatology

## 2013-02-12 DIAGNOSIS — D0430 Carcinoma in situ of skin of unspecified part of face: Secondary | ICD-10-CM | POA: Diagnosis not present

## 2013-02-12 DIAGNOSIS — L570 Actinic keratosis: Secondary | ICD-10-CM | POA: Diagnosis not present

## 2013-02-12 DIAGNOSIS — D0439 Carcinoma in situ of skin of other parts of face: Secondary | ICD-10-CM | POA: Diagnosis not present

## 2013-02-12 DIAGNOSIS — C44320 Squamous cell carcinoma of skin of unspecified parts of face: Secondary | ICD-10-CM | POA: Diagnosis not present

## 2013-02-22 ENCOUNTER — Encounter: Payer: Self-pay | Admitting: Internal Medicine

## 2013-02-23 ENCOUNTER — Other Ambulatory Visit: Payer: Self-pay | Admitting: *Deleted

## 2013-02-23 ENCOUNTER — Encounter: Payer: Self-pay | Admitting: Internal Medicine

## 2013-02-23 DIAGNOSIS — M48061 Spinal stenosis, lumbar region without neurogenic claudication: Secondary | ICD-10-CM

## 2013-02-23 MED ORDER — OXYCODONE-ACETAMINOPHEN 10-325 MG PO TABS: 1.0000 | ORAL_TABLET | Freq: Two times a day (BID) | ORAL | Status: DC | PRN

## 2013-02-26 ENCOUNTER — Encounter: Payer: Self-pay | Admitting: Internal Medicine

## 2013-02-26 DIAGNOSIS — M5410 Radiculopathy, site unspecified: Secondary | ICD-10-CM

## 2013-02-27 ENCOUNTER — Encounter: Payer: Self-pay | Admitting: Internal Medicine

## 2013-03-03 DIAGNOSIS — M545 Low back pain, unspecified: Secondary | ICD-10-CM | POA: Diagnosis not present

## 2013-03-03 DIAGNOSIS — E669 Obesity, unspecified: Secondary | ICD-10-CM | POA: Diagnosis not present

## 2013-03-03 DIAGNOSIS — M546 Pain in thoracic spine: Secondary | ICD-10-CM | POA: Diagnosis not present

## 2013-03-03 DIAGNOSIS — M47817 Spondylosis without myelopathy or radiculopathy, lumbosacral region: Secondary | ICD-10-CM | POA: Diagnosis not present

## 2013-03-09 ENCOUNTER — Encounter: Payer: Self-pay | Admitting: Internal Medicine

## 2013-03-09 ENCOUNTER — Other Ambulatory Visit: Payer: Self-pay | Admitting: Internal Medicine

## 2013-03-16 ENCOUNTER — Ambulatory Visit (INDEPENDENT_AMBULATORY_CARE_PROVIDER_SITE_OTHER): Payer: Medicare Other | Admitting: Internal Medicine

## 2013-03-16 ENCOUNTER — Encounter: Payer: Self-pay | Admitting: Internal Medicine

## 2013-03-16 VITALS — BP 160/80 | HR 80 | Temp 98.3°F | Resp 16 | Ht 72.0 in | Wt 203.0 lb

## 2013-03-16 DIAGNOSIS — IMO0002 Reserved for concepts with insufficient information to code with codable children: Secondary | ICD-10-CM | POA: Diagnosis not present

## 2013-03-16 DIAGNOSIS — I10 Essential (primary) hypertension: Secondary | ICD-10-CM | POA: Diagnosis not present

## 2013-03-16 DIAGNOSIS — M5410 Radiculopathy, site unspecified: Secondary | ICD-10-CM

## 2013-03-16 MED ORDER — OXYCODONE HCL ER 20 MG PO T12A: 20.0000 mg | EXTENDED_RELEASE_TABLET | Freq: Two times a day (BID) | ORAL | Status: DC

## 2013-03-16 MED ORDER — OXYCODONE-ACETAMINOPHEN 5-325 MG PO TABS: 1.0000 | ORAL_TABLET | ORAL | Status: DC | PRN

## 2013-03-16 NOTE — Progress Notes (Signed)
Subjective:    Patient ID: [name], male    DOB: [birth year], 79 y.o.   MRN: [id number]  HPI Patient has significant pain resulting from his low back.  Her blood pressure does elevate with pain.  He has been back to see him in surgeon who scheduled an MRI and a followup in September.  He is currently taking Percocet 5. No side effects she experiences from this medication includes constipation but also includes incomplete treatment of the pain Currently he is taking 2 tablets Take Senokot for his constipation    Review of Systems  Constitutional: Positive for fatigue. Negative for fever.  HENT: Negative for hearing loss, congestion, neck pain and postnasal drip.   Eyes: Negative for discharge, redness and visual disturbance.  Respiratory: Negative for cough, shortness of breath and wheezing.   Cardiovascular: Negative for leg swelling.  Gastrointestinal: Positive for constipation. Negative for abdominal pain and abdominal distention.  Genitourinary: Negative for urgency and frequency.  Musculoskeletal: Positive for back pain and gait problem. Negative for joint swelling.  Skin: Negative for color change and rash.  Neurological: Negative for weakness and light-headedness.  Hematological: Negative for adenopathy.  Psychiatric/Behavioral: Positive for dysphoric mood. Negative for behavioral problems.   Past Medical History  Diagnosis Date  . ACTINIC KERATOSIS, HEAD 03/23/2008  . ALLERGIC RHINITIS 03/27/2007  . BENIGN PROSTATIC HYPERTROPHY, WITH URINARY OBSTRUCTION 10/16/2007  . CARPAL TUNNEL SYNDROME, LEFT 04/13/2008  . ERECTILE DYSFUNCTION, ORGANIC 02/20/2010  . Gout, unspecified 02/20/2010  . HYPERLIPIDEMIA, WITH HIGH HDL 10/18/2008  . KNEE PAIN 10/10/2009  . LOW BACK PAIN 03/27/2007  . NEOPLASM, MALIGNANT, SKIN, TRUNK 05/09/2007  . Patellar tendinitis 10/10/2009  . SKIN CANCER, HX OF 03/27/2007  . UNS ADVRS EFF UNS RX MEDICINAL&BIOLOGICAL SBSTNC 08/18/2007  . HYPERTENSION 05/09/2007     sees Dr. Darryll Capers  . Headache(784.0)     migraines hx of  . Chronic neck pain   . Chronic back pain   . CAD 08/18/2007    "patient denies any issues with heart, does not see cardiologist"  . Urinary urgency     History   Social History  . Marital Status: Married    Spouse Name: N/A    Number of Children: N/A  . Years of Education: N/A   Occupational History  . Not on file.   Social History Main Topics  . Smoking status: Former Smoker    Quit date: 09/29/1981  . Smokeless tobacco: Not on file  . Alcohol Use: 1.5 oz/week    3 drink(s) per week     Comment: daily  . Drug Use: No  . Sexually Active: Yes   Other Topics Concern  . Not on file   Social History Narrative  . No narrative on file    Past Surgical History  Procedure Laterality Date  . Knee arthroscopy    . Cholecystectomy    . Nasal sinus surgery    . Vasectomy    . Lumbar fusions    . Joint replacement surgery    . Rectal fissure    . Lateral fusion lumbar spine, transverse    . Laminectomy    . Lumbar fusion    . Rectal fissure    . Vitrectomy      right and left 2013  . Carpal tunnel release    . Eye surgery      bilateral cataracts removed and vitrectomies  . Blepharoplasty    . Back surgery    . Posterior  cervical fusion/foraminotomy Right 10/01/2012    Procedure: POSTERIOR CERVICAL FUSION/FORAMINOTOMY LEVEL 1;  Surgeon: Hewitt Shorts, MD;  Location: [facility];  Service: Neurosurgery;  Laterality: Right;  Right Cervical seven thoracic one Cervical laminectomy/foraminotomy with posterior cervical arthrodesis   . Joint replacement Right     knee  . Appendectomy  1969  . Lumbar laminectomy/decompression microdiscectomy Left 11/03/2012    Procedure: LUMBAR LAMINECTOMY/DECOMPRESSION MICRODISCECTOMY 1 LEVEL;  Surgeon: Hewitt Shorts, MD;  Location: [facility];  Service: Neurosurgery;  Laterality: Left;  LEFT L5S1 laminotomy foraminotomy and possible microdiskectomy    Family  History  Problem Relation Age of Onset  . Dementia Mother   . Anemia Father   . Heart disease Father     No Known Allergies  Current Outpatient Prescriptions on File Prior to Visit  Medication Sig Dispense Refill  . diltiazem (CARDIZEM CD) 300 MG 24 hr capsule TAKE 1 TABLET BY MOUTH EVERY DAY  90 capsule  3  . Folic Acid-Vit B6-Vit B12 2.3-24.5-2 MG TABS Take 1 tablet by mouth 2 (two) times daily.  60 tablet  3  . furosemide (LASIX) 20 MG tablet Take 1 tablet (20 mg total) by mouth daily.  30 tablet  3  . rosuvastatin (CRESTOR) 20 MG tablet Take 20 mg by mouth once a week.       . telmisartan (MICARDIS) 80 MG tablet Take 0.5 tablets (40 mg total) by mouth daily.  30 tablet  6  . terazosin (HYTRIN) 5 MG capsule Take 1 capsule (5 mg total) by mouth at bedtime.  30 capsule  11  . triamcinolone cream (KENALOG) 0.5 % Apply 1 application topically 2 (two) times daily as needed (for rash).      . triamcinolone cream (KENALOG) 0.5 % USE AS DIRECTED  60 g  2  . gabapentin (NEURONTIN) 300 MG capsule Take 1 capsule (300 mg total) by mouth 3 (three) times daily.  90 capsule  3   No current facility-administered medications on file prior to visit.    BP 160/80  Pulse 80  Temp(Src) 98.3 F (36.8 C)  Resp 16  Ht 6' (1.829 m)  Wt 203 lb (92.08 kg)  BMI 27.53 kg/m2       Objective:   Physical Exam  Nursing note and vitals reviewed. Constitutional: He appears well-developed and well-nourished.  HENT:  Head: Normocephalic and atraumatic.  Eyes: Conjunctivae are normal. Pupils are equal, round, and reactive to light.  Neck: Normal range of motion. Neck supple.  Cardiovascular: Normal rate and regular rhythm.   Pulmonary/Chest: Effort normal and breath sounds normal.  Abdominal: Soft. Bowel sounds are normal.          Assessment & Plan:  Severe radicular type pain on the left block and down the left leg to the ankle To suggest L5-S1 pain  Change from the oxycodone to the OxyContin  tablet or slow acting and consistent release of the pain medication.

## 2013-03-19 ENCOUNTER — Other Ambulatory Visit: Payer: Self-pay | Admitting: Neurosurgery

## 2013-03-19 DIAGNOSIS — Z85828 Personal history of other malignant neoplasm of skin: Secondary | ICD-10-CM | POA: Diagnosis not present

## 2013-03-19 DIAGNOSIS — D485 Neoplasm of uncertain behavior of skin: Secondary | ICD-10-CM | POA: Diagnosis not present

## 2013-03-19 DIAGNOSIS — M5416 Radiculopathy, lumbar region: Secondary | ICD-10-CM

## 2013-03-25 ENCOUNTER — Ambulatory Visit
Admission: RE | Admit: 2013-03-25 | Discharge: 2013-03-25 | Disposition: A | Discharge status: Home / Self Care | Payer: Medicare Other | Source: Ambulatory Visit | Attending: Neurosurgery | Admitting: Neurosurgery

## 2013-03-25 DIAGNOSIS — M5416 Radiculopathy, lumbar region: Secondary | ICD-10-CM

## 2013-03-25 DIAGNOSIS — M47817 Spondylosis without myelopathy or radiculopathy, lumbosacral region: Secondary | ICD-10-CM | POA: Diagnosis not present

## 2013-03-25 DIAGNOSIS — M5137 Other intervertebral disc degeneration, lumbosacral region: Secondary | ICD-10-CM | POA: Diagnosis not present

## 2013-03-25 MED ORDER — GADOBENATE DIMEGLUMINE 529 MG/ML IV SOLN
20.0000 mL | Freq: Once | INTRAVENOUS | Status: AC | PRN
  Administered 2013-03-25: 20 mL via INTRAVENOUS

## 2013-04-06 ENCOUNTER — Encounter: Payer: Self-pay | Admitting: Internal Medicine

## 2013-04-10 ENCOUNTER — Encounter: Payer: Self-pay | Admitting: Internal Medicine

## 2013-04-14 ENCOUNTER — Encounter: Payer: Self-pay | Admitting: Internal Medicine

## 2013-04-14 ENCOUNTER — Telehealth: Payer: Self-pay | Admitting: Internal Medicine

## 2013-04-14 NOTE — Telephone Encounter (Signed)
Pt called and stated that Dr. Lovell Sheehan asked that the pt's neurosurgeon prescribe his  oxyCODONE-acetaminophen (PERCOCET/ROXICET) 5-325 MG per tablet. PT stated that his neurosurgeon is refusing to fill this, and he is almost out. He would like a refill. Please assist.

## 2013-04-16 NOTE — Telephone Encounter (Signed)
Pt is out of med. Pt is aware MD is out of office until Monday. Please call pt on cell [phone]

## 2013-04-17 MED ORDER — OXYCODONE-ACETAMINOPHEN 5-325 MG PO TABS: 1.0000 | ORAL_TABLET | ORAL | Status: DC | PRN

## 2013-04-17 NOTE — Telephone Encounter (Signed)
Will you sign for this?

## 2013-04-17 NOTE — Telephone Encounter (Signed)
Pt aware, rx up front for p/u

## 2013-04-17 NOTE — Telephone Encounter (Signed)
Ok  #30 

## 2013-04-17 NOTE — Telephone Encounter (Signed)
Patient is calling back. He is frustrated that this has not been taken care of. He knows Dr Lovell Sheehan is out until Monday, but he is out of medication and wants to know if another doctor can assist. Please call and advise.

## 2013-04-20 ENCOUNTER — Other Ambulatory Visit: Payer: Self-pay | Admitting: *Deleted

## 2013-04-20 NOTE — Telephone Encounter (Signed)
May refill 

## 2013-04-22 ENCOUNTER — Telehealth: Payer: Self-pay | Admitting: Internal Medicine

## 2013-04-22 NOTE — Telephone Encounter (Signed)
Patient calling with Left  buttock pain and  Left calf pain.  Hx of prior injury, continuing Sx.  Had been on Oxycotin Time release tabs BID Q12 Hours and this was helping.  Last Rx given was Oxycodone on 9/5 and will be out of these in 6-8 hours.  Only getting relief for 2 hours after dose and having to take it more often.  Says last MRI leaving nerve root uncovered and pain continues - current pain level 8/10 and planning to take Rx again within the next hour.  Last Rx at 2:00 pm.  Triaged in Back pain Guideline - Disposition:  See Provider within 72 Hours due to evaluation by provider and no improvement in Symptoms after following treatment plan for time specified by provider.   Trying to get new prescription for Oxycotin time release as he had before.  Realizes offices closed. Please reveiw, contact patient at  [phone].

## 2013-04-23 ENCOUNTER — Other Ambulatory Visit: Payer: Self-pay | Admitting: *Deleted

## 2013-04-23 ENCOUNTER — Telehealth: Payer: Self-pay | Admitting: Internal Medicine

## 2013-04-23 DIAGNOSIS — I10 Essential (primary) hypertension: Secondary | ICD-10-CM

## 2013-04-23 DIAGNOSIS — M5410 Radiculopathy, site unspecified: Secondary | ICD-10-CM

## 2013-04-23 MED ORDER — OXYCODONE HCL ER 20 MG PO T12A: 20.0000 mg | EXTENDED_RELEASE_TABLET | Freq: Two times a day (BID) | ORAL | Status: DC

## 2013-04-23 NOTE — Telephone Encounter (Signed)
[facility] Triage Call Report Triage Record Num: [id number] Operator: Revonda Humphrey Patient Name: Finlee Milo Call Date & Time: 04/22/2013 5:18:42PM Patient Phone: [phone] PCP: Darryll Capers Patient Gender: Male PCP Fax : [phone] Patient DOB: Mar 21, 1932 Practice Name: [facility] Reason for Call: Patient calling with buttock pain and calf pain. Hx of prior injury, continuing Sx. Had been on Oxycotin Time release tabs BID Q12 Hours and this was helping. Last Rx given was Oxycodone on 9/5 and will be out of these in 6-8 hours. Only getting relief for 2 hours after dose and having to take it more often. Says last MRI leaving nerve root uncovered and pain continues. Triaged in Back pain Guideline - Disposition: See Provider within 72 Hours due to evaluation by provider and no improvement in Symptoms after following treatment plan for time specified by provider. Trying to get new prescription for Oxycotin time release as he had before. Realizes offices closed. Please reveiw, contact patient at [phone]. Note sent in EPIC Protocol(s) Used: Back Symptoms Recommended Outcome per Protocol: See Provider within 72 Hours Reason for Outcome: Evaluated by provider AND no improvement in symptoms after following treatment plan for the time specified by provider Care Advice: ~ 09/

## 2013-04-23 NOTE — Telephone Encounter (Signed)
Ok to RF meds until Monday

## 2013-04-23 NOTE — Telephone Encounter (Signed)
He has ov with dr Jule Ser tomorrow and hopefully he will give him pain med then.

## 2013-04-23 NOTE — Telephone Encounter (Signed)
Talked with pt again and he states dr Jule Ser will not give oxycontin and oxycodone only last 2 hours.  fyi

## 2013-04-23 NOTE — Telephone Encounter (Signed)
Pt informed 10 days of script ready for pick up

## 2013-04-23 NOTE — Telephone Encounter (Signed)
Dr Lovell Sheehan is out of the office until Monday.  Would you like him to come in for ov or would you sign for oxycodone or oxycontin?

## 2013-04-24 DIAGNOSIS — IMO0002 Reserved for concepts with insufficient information to code with codable children: Secondary | ICD-10-CM | POA: Diagnosis not present

## 2013-04-24 DIAGNOSIS — M48062 Spinal stenosis, lumbar region with neurogenic claudication: Secondary | ICD-10-CM | POA: Diagnosis not present

## 2013-04-24 DIAGNOSIS — M47817 Spondylosis without myelopathy or radiculopathy, lumbosacral region: Secondary | ICD-10-CM | POA: Diagnosis not present

## 2013-04-24 DIAGNOSIS — M5137 Other intervertebral disc degeneration, lumbosacral region: Secondary | ICD-10-CM | POA: Diagnosis not present

## 2013-04-27 ENCOUNTER — Other Ambulatory Visit: Payer: Self-pay | Admitting: Neurosurgery

## 2013-04-27 ENCOUNTER — Encounter (HOSPITAL_COMMUNITY): Payer: Self-pay | Admitting: Pharmacy Technician

## 2013-04-29 ENCOUNTER — Other Ambulatory Visit: Payer: Self-pay | Admitting: Internal Medicine

## 2013-04-29 ENCOUNTER — Encounter: Payer: Self-pay | Admitting: Internal Medicine

## 2013-05-05 ENCOUNTER — Encounter (HOSPITAL_COMMUNITY)
Admission: RE | Admit: 2013-05-05 | Discharge: 2013-05-05 | Disposition: A | Discharge status: Home / Self Care | Payer: Medicare Other | Source: Ambulatory Visit | Attending: Neurosurgery | Admitting: Neurosurgery

## 2013-05-05 ENCOUNTER — Ambulatory Visit: Payer: Medicare Other

## 2013-05-05 ENCOUNTER — Encounter (HOSPITAL_COMMUNITY): Payer: Self-pay

## 2013-05-05 ENCOUNTER — Ambulatory Visit (INDEPENDENT_AMBULATORY_CARE_PROVIDER_SITE_OTHER): Payer: Medicare Other

## 2013-05-05 DIAGNOSIS — Z01812 Encounter for preprocedural laboratory examination: Secondary | ICD-10-CM | POA: Insufficient documentation

## 2013-05-05 DIAGNOSIS — Z23 Encounter for immunization: Secondary | ICD-10-CM

## 2013-05-05 DIAGNOSIS — Z01818 Encounter for other preprocedural examination: Secondary | ICD-10-CM | POA: Diagnosis not present

## 2013-05-05 HISTORY — DX: Personal history of malignant melanoma of skin: Z85.820

## 2013-05-05 LAB — CBC
HCT: 44.8 % (ref 39.0–52.0)
Hemoglobin: 14.9 g/dL (ref 13.0–17.0)
MCH: 30.2 pg (ref 26.0–34.0)
MCHC: 33.3 g/dL (ref 30.0–36.0)
MCV: 90.7 fL (ref 78.0–100.0)
Platelets: 449 10*3/uL — ABNORMAL HIGH (ref 150–400)
RBC: 4.94 MIL/uL (ref 4.22–5.81)
RDW: 13.5 % (ref 11.5–15.5)
WBC: 8 10*3/uL (ref 4.0–10.5)

## 2013-05-05 LAB — COMPREHENSIVE METABOLIC PANEL
ALT: 77 U/L — ABNORMAL HIGH (ref 0–53)
AST: 21 U/L (ref 0–37)
Albumin: 3.9 g/dL (ref 3.5–5.2)
Alkaline Phosphatase: 119 U/L — ABNORMAL HIGH (ref 39–117)
BUN: 20 mg/dL (ref 6–23)
CO2: 28 mEq/L (ref 19–32)
Calcium: 9.8 mg/dL (ref 8.4–10.5)
Chloride: 103 mEq/L (ref 96–112)
Creatinine, Ser: 1.14 mg/dL (ref 0.50–1.35)
GFR calc Af Amer: 69 mL/min — ABNORMAL LOW (ref >90)
GFR calc non Af Amer: 59 mL/min — ABNORMAL LOW (ref >90)
Glucose, Bld: 109 mg/dL — ABNORMAL HIGH (ref 70–99)
Potassium: 5.8 mEq/L — ABNORMAL HIGH (ref 3.5–5.1)
Sodium: 139 mEq/L (ref 135–145)
Total Bilirubin: 0.4 mg/dL (ref 0.3–1.2)
Total Protein: 6.9 g/dL (ref 6.0–8.3)

## 2013-05-05 LAB — TYPE AND SCREEN
ABO/RH(D): O POS
Antibody Screen: NEGATIVE

## 2013-05-05 LAB — SURGICAL PCR SCREEN
MRSA, PCR: NEGATIVE
Staphylococcus aureus: NEGATIVE

## 2013-05-05 LAB — ABO/RH: ABO/RH(D): O POS

## 2013-05-05 NOTE — Pre-Procedure Instructions (Signed)
[name]  05/05/2013   Your procedure is scheduled on: September 29  Report to [facility] Entrance "A" [address] at [place] AM.  Call this number if you have problems the morning of surgery: [phone]   Remember:   Do not eat food or drink liquids after midnight.   Take these medicines the morning of surgery with A SIP OF WATER: Diltiazem, Oxycodone (if needed),    Do not wear jewelry, make-up or nail polish.  Do not wear lotions, powders, or perfumes. You may wear deodorant.  Do not shave 48 hours prior to surgery. Men may shave face and neck.  Do not bring valuables to the hospital.  [facility] is not responsible                   for any belongings or valuables.  Contacts, dentures or bridgework may not be worn into surgery.  Leave suitcase in the car. After surgery it may be brought to your room.     Special Instructions: Shower using CHG 2 nights before surgery and the night before surgery.  If you shower the day of surgery use CHG.  Use special wash - you have one bottle of CHG for all showers.  You should use approximately 1/3 of the bottle for each shower.   Please read over the following fact sheets that you were given: Pain Booklet, Coughing and Deep Breathing, Blood Transfusion Information and Surgical Site Infection Prevention

## 2013-05-06 NOTE — Progress Notes (Signed)
Anesthesia Chart Review:  Patient is a 79 year old male scheduled for L5-S1 decompression, PLIF on 05/11/13 by Dr. Newell Coral.  History includes former smoker, HLD, melanoma, ED, HTN, BPH, headaches, L5-S1 decompression 11/03/12, C7-T1 posterior cervical fusion 10/01/12. PCP is Dr. Jonny Ruiz [facility].  Meds: diltiazem, Oxycontin, rosuvastatin, telmisartan, prednisone taper (to be completed ~ 05/08/13).  EKG on 10/01/12 showed NSR.  Echo on 01/07/13 showed: - Left ventricle: The cavity size was normal. Systolic function was normal. The estimated ejection fraction was in the range of 55% to 60%. Wall motion was normal; there were no regional wall motion abnormalities. - Left atrium: The atrium was moderately dilated. - Atrial septum: No defect or patent foramen ovale was identified. - Trivial mitral and tricuspid regurgitation.  CXR on 05/05/13 showed no acute cardiopulmonary disease.  Preoperative labs noted.  K+ 5.8 (specimen is not marked as hemolyzed; ARB may be contributing to hyperkalemia). Cr 1.14.  AST 21.  ALT newly elevated at 77.  Glucose 109. H/H WNL.  PLT 449K.  K+ results called to Darl Pikes at Dr. Earl Gala office.  She will review with Dr. Newell Coral for additional recommendations.  He will need an ISTAT on the day of surgery to re-evaluate for hyperkalemia.  Would anticipate that as long as K+ is < 6.0 then he could proceed.  Velna Ochs [facility] Short Stay Center/Anesthesiology Phone [phone] 05/06/2013 9:44 AM

## 2013-05-10 MED ORDER — CEFAZOLIN SODIUM-DEXTROSE 2-3 GM-% IV SOLR
2.0000 g | INTRAVENOUS | Status: AC
  Administered 2013-05-11 (×2): 2 g via INTRAVENOUS
  Filled 2013-05-10: qty 50

## 2013-05-11 ENCOUNTER — Inpatient Hospital Stay (HOSPITAL_COMMUNITY): Payer: Medicare Other

## 2013-05-11 ENCOUNTER — Encounter (HOSPITAL_COMMUNITY): Payer: Self-pay | Admitting: Vascular Surgery

## 2013-05-11 ENCOUNTER — Encounter (HOSPITAL_COMMUNITY): Payer: Self-pay | Admitting: *Deleted

## 2013-05-11 ENCOUNTER — Inpatient Hospital Stay (HOSPITAL_COMMUNITY): Payer: Medicare Other | Admitting: Anesthesiology

## 2013-05-11 ENCOUNTER — Encounter (HOSPITAL_COMMUNITY)
Admission: RE | Disposition: A | Discharge status: Home / Self Care | Payer: Medicare Other | Source: Ambulatory Visit | Attending: Neurosurgery

## 2013-05-11 ENCOUNTER — Inpatient Hospital Stay (HOSPITAL_COMMUNITY)
Admission: RE | Admit: 2013-05-11 | Discharge: 2013-05-13 | DRG: 460 | Disposition: A | Discharge status: Home / Self Care | Payer: Medicare Other | Source: Ambulatory Visit | Attending: Neurosurgery | Admitting: Neurosurgery

## 2013-05-11 DIAGNOSIS — Z832 Family history of diseases of the blood and blood-forming organs and certain disorders involving the immune mechanism: Secondary | ICD-10-CM | POA: Diagnosis not present

## 2013-05-11 DIAGNOSIS — Z9089 Acquired absence of other organs: Secondary | ICD-10-CM

## 2013-05-11 DIAGNOSIS — Z9852 Vasectomy status: Secondary | ICD-10-CM | POA: Diagnosis not present

## 2013-05-11 DIAGNOSIS — M5137 Other intervertebral disc degeneration, lumbosacral region: Secondary | ICD-10-CM | POA: Diagnosis present

## 2013-05-11 DIAGNOSIS — Z8249 Family history of ischemic heart disease and other diseases of the circulatory system: Secondary | ICD-10-CM

## 2013-05-11 DIAGNOSIS — M48061 Spinal stenosis, lumbar region without neurogenic claudication: Secondary | ICD-10-CM | POA: Diagnosis not present

## 2013-05-11 DIAGNOSIS — M5126 Other intervertebral disc displacement, lumbar region: Principal | ICD-10-CM | POA: Diagnosis present

## 2013-05-11 DIAGNOSIS — Z79899 Other long term (current) drug therapy: Secondary | ICD-10-CM

## 2013-05-11 DIAGNOSIS — Z981 Arthrodesis status: Secondary | ICD-10-CM | POA: Diagnosis not present

## 2013-05-11 DIAGNOSIS — M47817 Spondylosis without myelopathy or radiculopathy, lumbosacral region: Secondary | ICD-10-CM | POA: Diagnosis not present

## 2013-05-11 DIAGNOSIS — I10 Essential (primary) hypertension: Secondary | ICD-10-CM | POA: Diagnosis present

## 2013-05-11 DIAGNOSIS — Q762 Congenital spondylolisthesis: Secondary | ICD-10-CM | POA: Diagnosis not present

## 2013-05-11 DIAGNOSIS — Z85820 Personal history of malignant melanoma of skin: Secondary | ICD-10-CM | POA: Diagnosis not present

## 2013-05-11 DIAGNOSIS — E785 Hyperlipidemia, unspecified: Secondary | ICD-10-CM | POA: Diagnosis not present

## 2013-05-11 DIAGNOSIS — Z87891 Personal history of nicotine dependence: Secondary | ICD-10-CM

## 2013-05-11 DIAGNOSIS — Z96659 Presence of unspecified artificial knee joint: Secondary | ICD-10-CM

## 2013-05-11 DIAGNOSIS — M51379 Other intervertebral disc degeneration, lumbosacral region without mention of lumbar back pain or lower extremity pain: Secondary | ICD-10-CM | POA: Diagnosis present

## 2013-05-11 DIAGNOSIS — M4310 Spondylolisthesis, site unspecified: Secondary | ICD-10-CM | POA: Diagnosis not present

## 2013-05-11 DIAGNOSIS — M109 Gout, unspecified: Secondary | ICD-10-CM | POA: Diagnosis present

## 2013-05-11 DIAGNOSIS — M48062 Spinal stenosis, lumbar region with neurogenic claudication: Secondary | ICD-10-CM | POA: Diagnosis not present

## 2013-05-11 DIAGNOSIS — M533 Sacrococcygeal disorders, not elsewhere classified: Secondary | ICD-10-CM | POA: Diagnosis not present

## 2013-05-11 DIAGNOSIS — IMO0002 Reserved for concepts with insufficient information to code with codable children: Secondary | ICD-10-CM | POA: Diagnosis not present

## 2013-05-11 DIAGNOSIS — I2510 Atherosclerotic heart disease of native coronary artery without angina pectoris: Secondary | ICD-10-CM | POA: Diagnosis present

## 2013-05-11 LAB — POCT I-STAT 4, (NA,K, GLUC, HGB,HCT)
Glucose, Bld: 92 mg/dL (ref 70–99)
HCT: 44 % (ref 39.0–52.0)
Hemoglobin: 15 g/dL (ref 13.0–17.0)
Potassium: 4 mEq/L (ref 3.5–5.1)
Sodium: 142 mEq/L (ref 135–145)

## 2013-05-11 SURGERY — POSTERIOR LUMBAR FUSION 1 LEVEL
Anesthesia: General | Site: Back | Wound class: Clean

## 2013-05-11 MED ORDER — DEXTROSE-NACL 5-0.45 % IV SOLN
INTRAVENOUS | Status: DC
  Administered 2013-05-11: 23:00:00 via INTRAVENOUS
  Filled 2013-05-11 (×2): qty 1000

## 2013-05-11 MED ORDER — DILTIAZEM HCL ER COATED BEADS 300 MG PO CP24
300.0000 mg | ORAL_CAPSULE | Freq: Every day | ORAL | Status: DC
  Administered 2013-05-12 – 2013-05-13 (×2): 300 mg via ORAL
  Filled 2013-05-11 (×2): qty 1

## 2013-05-11 MED ORDER — CYCLOBENZAPRINE HCL 10 MG PO TABS
10.0000 mg | ORAL_TABLET | Freq: Three times a day (TID) | ORAL | Status: DC | PRN
  Administered 2013-05-11 – 2013-05-12 (×2): 10 mg via ORAL
  Filled 2013-05-11: qty 1

## 2013-05-11 MED ORDER — ACETAMINOPHEN 325 MG PO TABS: 650.0000 mg | ORAL_TABLET | ORAL | Status: DC | PRN

## 2013-05-11 MED ORDER — ROCURONIUM BROMIDE 100 MG/10ML IV SOLN
INTRAVENOUS | Status: DC | PRN
  Administered 2013-05-11: 10 mg via INTRAVENOUS
  Administered 2013-05-11: 50 mg via INTRAVENOUS

## 2013-05-11 MED ORDER — NEOSTIGMINE METHYLSULFATE 1 MG/ML IJ SOLN
INTRAMUSCULAR | Status: DC | PRN
  Administered 2013-05-11: 4 mg via INTRAVENOUS

## 2013-05-11 MED ORDER — ATORVASTATIN CALCIUM 40 MG PO TABS: 40.0000 mg | ORAL_TABLET | Freq: Every day | ORAL | Status: DC

## 2013-05-11 MED ORDER — BISACODYL 10 MG RE SUPP: 10.0000 mg | Freq: Every day | RECTAL | Status: DC | PRN

## 2013-05-11 MED ORDER — OXYCODONE HCL 5 MG PO TABS
5.0000 mg | ORAL_TABLET | Freq: Once | ORAL | Status: AC | PRN
  Administered 2013-05-11: 5 mg via ORAL

## 2013-05-11 MED ORDER — MEPERIDINE HCL 25 MG/ML IJ SOLN: 6.2500 mg | INTRAMUSCULAR | Status: DC | PRN

## 2013-05-11 MED ORDER — ONDANSETRON HCL 4 MG/2ML IJ SOLN: 4.0000 mg | Freq: Four times a day (QID) | INTRAMUSCULAR | Status: DC | PRN

## 2013-05-11 MED ORDER — HYDROCODONE-ACETAMINOPHEN 5-325 MG PO TABS: 1.0000 | ORAL_TABLET | ORAL | Status: DC | PRN

## 2013-05-11 MED ORDER — LIDOCAINE-EPINEPHRINE 1 %-1:100000 IJ SOLN
INTRAMUSCULAR | Status: DC | PRN
  Administered 2013-05-11: 10 mL

## 2013-05-11 MED ORDER — CYCLOBENZAPRINE HCL 10 MG PO TABS
ORAL_TABLET | ORAL | Status: AC
  Filled 2013-05-11: qty 1

## 2013-05-11 MED ORDER — ALBUMIN HUMAN 5 % IV SOLN
INTRAVENOUS | Status: DC | PRN
  Administered 2013-05-11: 15:00:00 via INTRAVENOUS

## 2013-05-11 MED ORDER — KETOROLAC TROMETHAMINE 0.5 % OP SOLN
1.0000 [drp] | Freq: Four times a day (QID) | OPHTHALMIC | Status: DC
  Administered 2013-05-11: 1 [drp] via OPHTHALMIC
  Filled 2013-05-11: qty 3

## 2013-05-11 MED ORDER — MENTHOL 3 MG MT LOZG: 1.0000 | LOZENGE | OROMUCOSAL | Status: DC | PRN

## 2013-05-11 MED ORDER — FENTANYL CITRATE 0.05 MG/ML IJ SOLN
INTRAMUSCULAR | Status: DC | PRN
  Administered 2013-05-11: 25 ug via INTRAVENOUS
  Administered 2013-05-11: 50 ug via INTRAVENOUS
  Administered 2013-05-11: 25 ug via INTRAVENOUS
  Administered 2013-05-11: 250 ug via INTRAVENOUS

## 2013-05-11 MED ORDER — KETOROLAC TROMETHAMINE 30 MG/ML IJ SOLN
INTRAMUSCULAR | Status: AC
  Administered 2013-05-11: 30 mg
  Filled 2013-05-11: qty 1

## 2013-05-11 MED ORDER — SODIUM CHLORIDE 0.9 % IJ SOLN
3.0000 mL | Freq: Two times a day (BID) | INTRAMUSCULAR | Status: DC
  Administered 2013-05-11 – 2013-05-12 (×3): 3 mL via INTRAVENOUS

## 2013-05-11 MED ORDER — THROMBIN 20000 UNITS EX SOLR
CUTANEOUS | Status: DC | PRN
  Administered 2013-05-11: 14:00:00 via TOPICAL

## 2013-05-11 MED ORDER — OXYCODONE HCL ER 10 MG PO T12A
20.0000 mg | EXTENDED_RELEASE_TABLET | Freq: Two times a day (BID) | ORAL | Status: DC
  Administered 2013-05-11: 20 mg via ORAL
  Filled 2013-05-11: qty 2

## 2013-05-11 MED ORDER — THROMBIN 5000 UNITS EX SOLR
OROMUCOSAL | Status: DC | PRN
  Administered 2013-05-11: 16:00:00 via TOPICAL

## 2013-05-11 MED ORDER — HYDROXYZINE HCL 25 MG PO TABS: 50.0000 mg | ORAL_TABLET | ORAL | Status: DC | PRN

## 2013-05-11 MED ORDER — SODIUM CHLORIDE 0.9 % IR SOLN
Status: DC | PRN
  Administered 2013-05-11: 14:00:00

## 2013-05-11 MED ORDER — HYDROMORPHONE HCL PF 1 MG/ML IJ SOLN
INTRAMUSCULAR | Status: AC
  Administered 2013-05-11: 0.5 mg via INTRAVENOUS
  Filled 2013-05-11: qty 1

## 2013-05-11 MED ORDER — BUPIVACAINE HCL (PF) 0.5 % IJ SOLN
INTRAMUSCULAR | Status: DC | PRN
  Administered 2013-05-11: 10 mL

## 2013-05-11 MED ORDER — PHENOL 1.4 % MT LIQD: 1.0000 | OROMUCOSAL | Status: DC | PRN

## 2013-05-11 MED ORDER — KETOROLAC TROMETHAMINE 30 MG/ML IJ SOLN
30.0000 mg | Freq: Four times a day (QID) | INTRAMUSCULAR | Status: DC
  Administered 2013-05-11 – 2013-05-13 (×6): 30 mg via INTRAVENOUS
  Filled 2013-05-11 (×8): qty 1

## 2013-05-11 MED ORDER — SODIUM CHLORIDE 0.9 % IJ SOLN: 3.0000 mL | INTRAMUSCULAR | Status: DC | PRN

## 2013-05-11 MED ORDER — LACTATED RINGERS IV SOLN
Freq: Once | INTRAVENOUS | Status: AC
  Administered 2013-05-11: 09:00:00 via INTRAVENOUS

## 2013-05-11 MED ORDER — MIDAZOLAM HCL 5 MG/5ML IJ SOLN
INTRAMUSCULAR | Status: DC | PRN
  Administered 2013-05-11: 1 mg via INTRAVENOUS

## 2013-05-11 MED ORDER — OXYCODONE HCL 5 MG/5ML PO SOLN: 5.0000 mg | Freq: Once | ORAL | Status: AC | PRN

## 2013-05-11 MED ORDER — PROPOFOL 10 MG/ML IV BOLUS
INTRAVENOUS | Status: DC | PRN
  Administered 2013-05-11: 100 mg via INTRAVENOUS

## 2013-05-11 MED ORDER — 0.9 % SODIUM CHLORIDE (POUR BTL) OPTIME
TOPICAL | Status: DC | PRN
  Administered 2013-05-11: 1000 mL

## 2013-05-11 MED ORDER — ONDANSETRON HCL 4 MG/2ML IJ SOLN
INTRAMUSCULAR | Status: DC | PRN
  Administered 2013-05-11: 4 mg via INTRAVENOUS

## 2013-05-11 MED ORDER — OXYCODONE HCL 5 MG PO TABS
ORAL_TABLET | ORAL | Status: AC
  Filled 2013-05-11: qty 1

## 2013-05-11 MED ORDER — PROMETHAZINE HCL 25 MG/ML IJ SOLN: 6.2500 mg | INTRAMUSCULAR | Status: DC | PRN

## 2013-05-11 MED ORDER — LIDOCAINE HCL (CARDIAC) 20 MG/ML IV SOLN
INTRAVENOUS | Status: DC | PRN
  Administered 2013-05-11: 40 mg via INTRAVENOUS

## 2013-05-11 MED ORDER — HYDROMORPHONE HCL PF 1 MG/ML IJ SOLN
0.2500 mg | INTRAMUSCULAR | Status: DC | PRN
  Administered 2013-05-11 (×4): 0.5 mg via INTRAVENOUS

## 2013-05-11 MED ORDER — IRBESARTAN 300 MG PO TABS
300.0000 mg | ORAL_TABLET | Freq: Every day | ORAL | Status: DC
  Administered 2013-05-12 – 2013-05-13 (×2): 300 mg via ORAL
  Filled 2013-05-11 (×2): qty 1

## 2013-05-11 MED ORDER — CEFAZOLIN SODIUM 1-5 GM-% IV SOLN
INTRAVENOUS | Status: AC
  Filled 2013-05-11: qty 100

## 2013-05-11 MED ORDER — ATORVASTATIN CALCIUM 40 MG PO TABS: 40.0000 mg | ORAL_TABLET | ORAL | Status: DC

## 2013-05-11 MED ORDER — SODIUM CHLORIDE 0.9 % IV SOLN: 250.0000 mL | INTRAVENOUS | Status: DC

## 2013-05-11 MED ORDER — EPHEDRINE SULFATE 50 MG/ML IJ SOLN
INTRAMUSCULAR | Status: DC | PRN
  Administered 2013-05-11: 10 mg via INTRAVENOUS

## 2013-05-11 MED ORDER — ACETAMINOPHEN 650 MG RE SUPP: 650.0000 mg | RECTAL | Status: DC | PRN

## 2013-05-11 MED ORDER — HYDROMORPHONE HCL PF 1 MG/ML IJ SOLN
INTRAMUSCULAR | Status: AC
  Filled 2013-05-11: qty 1

## 2013-05-11 MED ORDER — GLYCOPYRROLATE 0.2 MG/ML IJ SOLN
INTRAMUSCULAR | Status: DC | PRN
  Administered 2013-05-11: .6 mg via INTRAVENOUS

## 2013-05-11 MED ORDER — KETOROLAC TROMETHAMINE 30 MG/ML IJ SOLN: 30.0000 mg | Freq: Once | INTRAMUSCULAR | Status: DC

## 2013-05-11 MED ORDER — LACTATED RINGERS IV SOLN
INTRAVENOUS | Status: DC | PRN
  Administered 2013-05-11 (×3): via INTRAVENOUS

## 2013-05-11 MED ORDER — MORPHINE SULFATE 4 MG/ML IJ SOLN
4.0000 mg | INTRAMUSCULAR | Status: DC | PRN
  Administered 2013-05-11: 4 mg via INTRAMUSCULAR
  Filled 2013-05-11: qty 1

## 2013-05-11 MED ORDER — MAGNESIUM HYDROXIDE 400 MG/5ML PO SUSP: 30.0000 mL | Freq: Every day | ORAL | Status: DC | PRN

## 2013-05-11 MED ORDER — OXYCODONE-ACETAMINOPHEN 5-325 MG PO TABS
1.0000 | ORAL_TABLET | ORAL | Status: DC | PRN
  Administered 2013-05-12 – 2013-05-13 (×4): 2 via ORAL
  Filled 2013-05-11 (×4): qty 2

## 2013-05-11 MED ORDER — ALUM & MAG HYDROXIDE-SIMETH 200-200-20 MG/5ML PO SUSP
30.0000 mL | Freq: Four times a day (QID) | ORAL | Status: DC | PRN
  Administered 2013-05-13: 30 mL via ORAL
  Filled 2013-05-11: qty 30

## 2013-05-11 MED ORDER — ATORVASTATIN CALCIUM 40 MG PO TABS
40.0000 mg | ORAL_TABLET | Freq: Every day | ORAL | Status: DC
  Filled 2013-05-11: qty 1

## 2013-05-11 MED ORDER — MIDAZOLAM HCL 2 MG/2ML IJ SOLN: 0.5000 mg | Freq: Once | INTRAMUSCULAR | Status: DC | PRN

## 2013-05-11 SURGICAL SUPPLY — 75 items
BAG DECANTER FOR FLEXI CONT (MISCELLANEOUS) ×2 IMPLANT
BLADE SURG ROTATE 9660 (MISCELLANEOUS) ×2 IMPLANT
BRUSH SCRUB EZ PLAIN DRY (MISCELLANEOUS) ×2 IMPLANT
BUR ACRON 5.0MM COATED (BURR) ×2 IMPLANT
BUR MATCHSTICK NEURO 3.0 LAGG (BURR) ×2 IMPLANT
CANISTER SUCTION 2500CC (MISCELLANEOUS) ×2 IMPLANT
CAP LCK SPNE (Orthopedic Implant) ×4 IMPLANT
CAP LOCK SPINE RADIUS (Orthopedic Implant) ×4 IMPLANT
CAP LOCKING (Orthopedic Implant) ×4 IMPLANT
CLOTH BEACON ORANGE TIMEOUT ST (SAFETY) IMPLANT
CONT SPEC 4OZ CLIKSEAL STRL BL (MISCELLANEOUS) ×4 IMPLANT
COVER BACK TABLE 24X17X13 BIG (DRAPES) IMPLANT
COVER TABLE BACK 60X90 (DRAPES) ×2 IMPLANT
DERMABOND ADHESIVE PROPEN (GAUZE/BANDAGES/DRESSINGS) ×1
DERMABOND ADVANCED (GAUZE/BANDAGES/DRESSINGS)
DERMABOND ADVANCED .7 DNX12 (GAUZE/BANDAGES/DRESSINGS) IMPLANT
DERMABOND ADVANCED .7 DNX6 (GAUZE/BANDAGES/DRESSINGS) ×1 IMPLANT
DRAPE C-ARM 42X72 X-RAY (DRAPES) ×8 IMPLANT
DRAPE LAPAROTOMY 100X72X124 (DRAPES) ×2 IMPLANT
DRAPE POUCH INSTRU U-SHP 10X18 (DRAPES) ×2 IMPLANT
DRAPE PROXIMA HALF (DRAPES) ×2 IMPLANT
DRSG EMULSION OIL 3X3 NADH (GAUZE/BANDAGES/DRESSINGS) IMPLANT
ELECT REM PT RETURN 9FT ADLT (ELECTROSURGICAL) ×2
ELECTRODE REM PT RTRN 9FT ADLT (ELECTROSURGICAL) ×1 IMPLANT
GAUZE SPONGE 4X4 16PLY XRAY LF (GAUZE/BANDAGES/DRESSINGS) ×2 IMPLANT
GLOVE BIOGEL PI IND STRL 7.5 (GLOVE) ×1 IMPLANT
GLOVE BIOGEL PI IND STRL 8 (GLOVE) ×5 IMPLANT
GLOVE BIOGEL PI IND STRL 8.5 (GLOVE) ×1 IMPLANT
GLOVE BIOGEL PI INDICATOR 7.5 (GLOVE) ×1
GLOVE BIOGEL PI INDICATOR 8 (GLOVE) ×5
GLOVE BIOGEL PI INDICATOR 8.5 (GLOVE) ×1
GLOVE ECLIPSE 7.5 STRL STRAW (GLOVE) ×18 IMPLANT
GLOVE EXAM NITRILE LRG STRL (GLOVE) IMPLANT
GLOVE EXAM NITRILE MD LF STRL (GLOVE) ×2 IMPLANT
GLOVE EXAM NITRILE XL STR (GLOVE) IMPLANT
GLOVE EXAM NITRILE XS STR PU (GLOVE) IMPLANT
GOWN BRE IMP SLV AUR LG STRL (GOWN DISPOSABLE) IMPLANT
GOWN BRE IMP SLV AUR XL STRL (GOWN DISPOSABLE) ×6 IMPLANT
GOWN STRL REIN 2XL LVL4 (GOWN DISPOSABLE) ×8 IMPLANT
HEMOSTAT POWDER SURGIFOAM 1G (HEMOSTASIS) ×2 IMPLANT
KIT BASIN OR (CUSTOM PROCEDURE TRAY) ×2 IMPLANT
KIT INFUSE SMALL (Orthopedic Implant) ×2 IMPLANT
KIT ROOM TURNOVER OR (KITS) ×2 IMPLANT
MILL MEDIUM DISP (BLADE) ×2 IMPLANT
NEEDLE BONE MARROW 8GAX6 (NEEDLE) ×2 IMPLANT
NEEDLE SPNL 18GX3.5 QUINCKE PK (NEEDLE) ×2 IMPLANT
NEEDLE SPNL 22GX3.5 QUINCKE BK (NEEDLE) ×4 IMPLANT
NS IRRIG 1000ML POUR BTL (IV SOLUTION) ×2 IMPLANT
PACK LAMINECTOMY NEURO (CUSTOM PROCEDURE TRAY) ×2 IMPLANT
PAD ARMBOARD 7.5X6 YLW CONV (MISCELLANEOUS) ×6 IMPLANT
PATTIES SURGICAL .5 X.5 (GAUZE/BANDAGES/DRESSINGS) IMPLANT
PATTIES SURGICAL .5 X1 (DISPOSABLE) IMPLANT
PATTIES SURGICAL 1X1 (DISPOSABLE) IMPLANT
PEEK PLIF AVS 11X20X8 (Peek) ×4 IMPLANT
ROD 5.5X30MM (Rod) ×4 IMPLANT
SCREW 5.75 X 635 (Screw) ×4 IMPLANT
SCREW 5.75X45MM (Screw) ×2 IMPLANT
SCREW 5.75X50MM (Screw) ×2 IMPLANT
SPONGE GAUZE 4X4 12PLY (GAUZE/BANDAGES/DRESSINGS) ×2 IMPLANT
SPONGE LAP 4X18 X RAY DECT (DISPOSABLE) IMPLANT
SPONGE NEURO XRAY DETECT 1X3 (DISPOSABLE) ×2 IMPLANT
SPONGE SURGIFOAM ABS GEL 100 (HEMOSTASIS) ×2 IMPLANT
STRIP BIOACTIVE VITOSS 25X100X (Neuro Prosthesis/Implant) ×2 IMPLANT
SUT VIC AB 1 CT1 18XBRD ANBCTR (SUTURE) ×2 IMPLANT
SUT VIC AB 1 CT1 8-18 (SUTURE) ×2
SUT VIC AB 2-0 CP2 18 (SUTURE) ×4 IMPLANT
SUT VIC AB 3-0 SH 8-18 (SUTURE) ×2 IMPLANT
SYR 20ML ECCENTRIC (SYRINGE) ×2 IMPLANT
SYR 3ML LL SCALE MARK (SYRINGE) ×8 IMPLANT
SYR CONTROL 10ML LL (SYRINGE) ×2 IMPLANT
TAPE CLOTH SURG 4X10 WHT LF (GAUZE/BANDAGES/DRESSINGS) ×2 IMPLANT
TOWEL OR 17X24 6PK STRL BLUE (TOWEL DISPOSABLE) ×2 IMPLANT
TOWEL OR 17X26 10 PK STRL BLUE (TOWEL DISPOSABLE) ×2 IMPLANT
TRAY FOLEY CATH 14FRSI W/METER (CATHETERS) ×2 IMPLANT
WATER STERILE IRR 1000ML POUR (IV SOLUTION) ×2 IMPLANT

## 2013-05-11 NOTE — H&P (Signed)
Subjective: Patient is a 79 y.o. male who is admitted for treatment of recurrent left lumbar radiculopathy.  Patient is about 6 months status post a left L5 to procedure, left superior S1 hemilaminotomy foraminotomy. However he developed worsening left lumbar radicular pain, and followup MRI shows a severe left L5-S1 neural foraminal stenosis. He does also have a bilateral L5 pars interarticularis defect, with a grade 1 spondylolisthesis. He is admitted now for a bilateral L5-S1 lumbar decompression, including a complete left L5-S1 facetectomy, and the L5-S1 stabilization via a bilateral L5-S1 posterior lumbar interbody arthrodesis with interbody implants and bone graft, and a bilateral L5-S1 posterior lateral arthrodesis with posterior instrumentation and bone graft.   Patient Active Problem List   Diagnosis Date Noted  . Chronic radicular low back pain 12/15/2012  . Right elbow pain 07/18/2012  . GOUT, UNSPECIFIED 02/20/2010  . ERECTILE DYSFUNCTION, ORGANIC 02/20/2010  . KNEE PAIN 10/10/2009  . PATELLAR TENDINITIS 10/10/2009  . HYPERLIPIDEMIA, WITH HIGH HDL 10/18/2008  . CARPAL TUNNEL SYNDROME, LEFT 04/13/2008  . ACTINIC KERATOSIS, HEAD 03/23/2008  . BENIGN PROSTATIC HYPERTROPHY, WITH URINARY OBSTRUCTION 10/16/2007  . CAD 08/18/2007  . UNS ADVRS EFF UNS RX MEDICINAL&BIOLOGICAL SBSTNC 08/18/2007  . NEOPLASM, MALIGNANT, SKIN, TRUNK 05/09/2007  . HYPERTENSION 05/09/2007  . ALLERGIC RHINITIS 03/27/2007  . LOW BACK PAIN 03/27/2007  . SKIN CANCER, HX OF 03/27/2007   Past Medical History  Diagnosis Date  . ACTINIC KERATOSIS, HEAD 03/23/2008  . ALLERGIC RHINITIS 03/27/2007  . BENIGN PROSTATIC HYPERTROPHY, WITH URINARY OBSTRUCTION 10/16/2007  . CARPAL TUNNEL SYNDROME, LEFT 04/13/2008  . ERECTILE DYSFUNCTION, ORGANIC 02/20/2010  . Gout, unspecified 02/20/2010  . HYPERLIPIDEMIA, WITH HIGH HDL 10/18/2008  . KNEE PAIN 10/10/2009  . LOW BACK PAIN 03/27/2007  . NEOPLASM, MALIGNANT, SKIN, TRUNK 05/09/2007   . Patellar tendinitis 10/10/2009  . SKIN CANCER, HX OF 03/27/2007  . UNS ADVRS EFF UNS RX MEDICINAL&BIOLOGICAL SBSTNC 08/18/2007  . HYPERTENSION 05/09/2007    sees Dr. Darryll Capers  . Headache(784.0)     migraines hx of  . Chronic neck pain   . Chronic back pain   . CAD 08/18/2007    "patient denies any issues with heart, does not see cardiologist"  . Urinary urgency   . History of melanoma     Past Surgical History  Procedure Laterality Date  . Knee arthroscopy    . Cholecystectomy    . Nasal sinus surgery    . Vasectomy    . Lumbar fusions    . Joint replacement surgery    . Rectal fissure    . Lateral fusion lumbar spine, transverse    . Laminectomy    . Lumbar fusion    . Rectal fissure    . Vitrectomy      right and left 2013  . Carpal tunnel release    . Eye surgery      bilateral cataracts removed and vitrectomies  . Blepharoplasty    . Back surgery    . Posterior cervical fusion/foraminotomy Right 10/01/2012    Procedure: POSTERIOR CERVICAL FUSION/FORAMINOTOMY LEVEL 1;  Surgeon: Hewitt Shorts, MD;  Location: [facility];  Service: Neurosurgery;  Laterality: Right;  Right Cervical seven thoracic one Cervical laminectomy/foraminotomy with posterior cervical arthrodesis   . Joint replacement Right     knee  . Appendectomy  1969  . Lumbar laminectomy/decompression microdiscectomy Left 11/03/2012    Procedure: LUMBAR LAMINECTOMY/DECOMPRESSION MICRODISCECTOMY 1 LEVEL;  Surgeon: Hewitt Shorts, MD;  Location: [facility];  Service: Neurosurgery;  Laterality: Left;  LEFT L5S1 laminotomy foraminotomy and possible microdiskectomy  . Melanoma excision      No prescriptions prior to admission   Allergies  Allergen Reactions  . Gabapentin     Lower extremity edema    History  Substance Use Topics  . Smoking status: Former Smoker -- 1.00 packs/day for 33 years    Quit date: 09/29/1981  . Smokeless tobacco: Not on file  . Alcohol Use: 1.5 oz/week    3 drink(s) per  week     Comment: daily    Family History  Problem Relation Age of Onset  . Dementia Mother   . Anemia Father   . Heart disease Father      Review of Systems A comprehensive review of systems was negative.  Objective: Vital signs in last 24 hours:    EXAM: Patient well-developed well-nourished white male in no acute distress.   Lungs are clear to auscultation , the patient has symmetrical respiratory excursion. Heart has a regular rate and rhythm normal S1 and S2 no murmur.   Abdomen is soft nontender nondistended bowel sounds are present. Extremity examination shows no clubbing cyanosis or edema. Motor examination shows 5 over 5 strength in the lower extremities including the iliopsoas quadriceps dorsiflexor extensor hallicus  longus and plantar flexor bilaterally. Sensation is intact to pinprick in the distal lower extremities. Reflexes are symmetrical bilaterally. No pathologic reflexes are present. Patient has a normal gait and stance.   Data Review:CBC    Component Value Date/Time   WBC 8.0 05/05/2013 0934   RBC 4.94 05/05/2013 0934   HGB 14.9 05/05/2013 0934   HCT 44.8 05/05/2013 0934   PLT 449* 05/05/2013 0934   MCV 90.7 05/05/2013 0934   MCH 30.2 05/05/2013 0934   MCHC 33.3 05/05/2013 0934   RDW 13.5 05/05/2013 0934   LYMPHSABS 1.6 12/26/2012 1056   MONOABS 0.5 12/26/2012 1056   EOSABS 0.2 12/26/2012 1056   BASOSABS 0.0 12/26/2012 1056                          BMET    Component Value Date/Time   NA 139 05/05/2013 0934   K 5.8* 05/05/2013 0934   CL 103 05/05/2013 0934   CO2 28 05/05/2013 0934   GLUCOSE 109* 05/05/2013 0934   BUN 20 05/05/2013 0934   CREATININE 1.14 05/05/2013 0934   CALCIUM 9.8 05/05/2013 0934   GFRNONAA 59* 05/05/2013 0934   GFRAA 69* 05/05/2013 0934     Assessment/Plan: Patient is admitted now for a bilateral L5-S1 lumbar decompression and arthrodesis.  I've discussed with the patient the nature of his condition, the nature the surgical procedure, the  typical length of surgery, hospital stay, and overall recuperation, the limitations postoperatively, and risks of surgery. I discussed risks including risks of infection, bleeding, possibly need for transfusion, the risk of nerve root dysfunction with pain, weakness, numbness, or paresthesias, the risk of dural tear and CSF leakage and possible need for further surgery, the risk of failure of the arthrodesis and possibly for further surgery, the risk of anesthetic complications including myocardial infarction, stroke, pneumonia, and death. We discussed the need for postoperative immobilization in a lumbar brace. Understanding all this the patient does wish to proceed with surgery and is admitted for such.     Hewitt Shorts, MD 05/11/2013 7:45 AM

## 2013-05-11 NOTE — Anesthesia Postprocedure Evaluation (Signed)
  Anesthesia Post-op Note  Patient: [name]  Procedure(s) Performed: Procedure(s) with comments: Lumbar five-Sacral one decompression with posterior lumbar interbody fusion with interbody prosthesis and posterior lateral arthrodesis and nonsegmental instrumentation (N/A) - Lumbar five-Sacral one decompression with posterior lumbar interbody fusion with interbody prosthesis and posterior lateral arthrodesis and nonsegmental instrumentation  Patient Location: PACU  Anesthesia Type:General  Level of Consciousness: awake, alert , oriented and patient cooperative  Airway and Oxygen Therapy: Patient Spontanous Breathing and Patient connected to nasal cannula oxygen  Post-op Pain: mild  Post-op Assessment: Post-op Vital signs reviewed, Patient's Cardiovascular Status Stable, Respiratory Function Stable, Patent Airway, No signs of Nausea or vomiting and Pain level controlled  Post-op Vital Signs: Reviewed and stable  Complications: possible eye injury/mild eye discomfort, treating with Ketorolac drops

## 2013-05-11 NOTE — Preoperative (Signed)
Beta Blockers   Reason not to administer Beta Blockers:Not Applicable 

## 2013-05-11 NOTE — Anesthesia Procedure Notes (Signed)
Procedure Name: Intubation Date/Time: 05/11/2013 12:11 PM Performed by: Rogelia Boga Pre-anesthesia Checklist: Patient identified, Emergency Drugs available, Suction available, Patient being monitored and Timeout performed Patient Re-evaluated:Patient Re-evaluated prior to inductionOxygen Delivery Method: Circle system utilized Preoxygenation: Pre-oxygenation with 100% oxygen Intubation Type: IV induction Ventilation: Mask ventilation without difficulty Tube type: Oral Tube size: 7.5 mm Number of attempts: 1 Airway Equipment and Method: Stylet and Video-laryngoscopy Placement Confirmation: ETT inserted through vocal cords under direct vision,  positive ETCO2 and breath sounds checked- equal and bilateral Secured at: 22 cm Tube secured with: Tape Dental Injury: Teeth and Oropharynx as per pre-operative assessment  Comments: Glidescope utilized for intubation, cords easily seen, and ETT easily passed, very minimal neck extension, No damage to teeth, BBS=, VSS

## 2013-05-11 NOTE — Transfer of Care (Signed)
Immediate Anesthesia Transfer of Care Note  Patient: [name]  Procedure(s) Performed: Procedure(s) with comments: Lumbar five-Sacral one decompression with posterior lumbar interbody fusion with interbody prosthesis and posterior lateral arthrodesis and nonsegmental instrumentation (N/A) - Lumbar five-Sacral one decompression with posterior lumbar interbody fusion with interbody prosthesis and posterior lateral arthrodesis and nonsegmental instrumentation  Patient Location: PACU  Anesthesia Type:General  Level of Consciousness: awake, alert , oriented and patient cooperative  Airway & Oxygen Therapy: Patient Spontanous Breathing and Patient connected to nasal cannula oxygen  Post-op Assessment: Report given to PACU RN, Post -op Vital signs reviewed and stable and Patient moving all extremities X 4  Post vital signs: Reviewed and stable  Complications: No apparent anesthesia complications

## 2013-05-11 NOTE — Anesthesia Preprocedure Evaluation (Addendum)
Anesthesia Evaluation  Patient identified by MRN, date of birth, ID band Patient awake    Reviewed: Allergy & Precautions, H&P , NPO status , Patient's Chart, lab work & pertinent test results  Airway Mallampati: I TM Distance: >3 FB Neck ROM: Full    Dental  (+) Implants, Caps and Dental Advisory Given   Pulmonary former smoker (quit '83),  breath sounds clear to auscultation  Pulmonary exam normal       Cardiovascular hypertension, Pt. on medications - anginaRhythm:Regular Rate:Normal  5/14 ECHO: normal LVF, EF 60-65%, valves OK   Neuro/Psych Chronic back pain: narcotics    GI/Hepatic negative GI ROS, Neg liver ROS,   Endo/Other  negative endocrine ROS  Renal/GU negative Renal ROS     Musculoskeletal  (+) Arthritis -, Osteoarthritis,    Abdominal   Peds  Hematology negative hematology ROS (+)   Anesthesia Other Findings Patient reports bridge/dental implant damage after prior anesthetic 3/14  Reproductive/Obstetrics                         Anesthesia Physical Anesthesia Plan  ASA: II  Anesthesia Plan: General   Post-op Pain Management:    Induction: Intravenous  Airway Management Planned: Oral ETT and Video Laryngoscope Planned  Additional Equipment:   Intra-op Plan:   Post-operative Plan: Extubation in OR  Informed Consent: I have reviewed the patients History and Physical, chart, labs and discussed the procedure including the risks, benefits and alternatives for the proposed anesthesia with the patient or authorized representative who has indicated his/her understanding and acceptance.   Dental advisory given  Plan Discussed with: CRNA, Surgeon and Anesthesiologist  Anesthesia Plan Comments: (Plan routine monitors, GETA with VideoGlide intubation)       Anesthesia Quick Evaluation

## 2013-05-11 NOTE — Op Note (Signed)
05/11/2013  5:25 PM  PATIENT:  [name]  79 y.o. male  PRE-OPERATIVE DIAGNOSIS:  Left L5-S1 neural foraminal lumbar stenosis with disabling left lumbar radiculopathy, bilateral L5 pars interarticularis defect, grade 1 spondylolisthesis of L5 on S1, lumbar HNP, lumbar degenerative disc disease, lumbar spondylosis  POST-OPERATIVE DIAGNOSIS:  Left L5-S1 neural foraminal lumbar stenosis with disabling left lumbar radiculopathy, bilateral L5 pars interarticularis defect, grade 1 spondylolisthesis of L5 on S1, lumbar HNP, lumbar degenerative disc disease, lumbar spondylosis  PROCEDURE:  Procedure(s): Lumbar five-Sacral one decompression with posterior lumbar interbody fusion with interbody prosthesis and posterior lateral arthrodesis and nonsegmental instrumentation: L5 Gill procedure and bilateral L5-S1 lumbar decompression including facetectomy and foraminotomy, decompression of the exiting L5 and S1 nerve roots bilaterally, with decompression beyond that required for interbody arthrodesis; bilateral L5-S1 posterior lumbar interbody arthrodesis with AVS peek interbody implants, locally harvested morcellized autograft, Vitoss BA with bone marrow aspirate, and infuse; bilateral L5-S1 posterior lateral arthrodesis with radius posterior instrumentation, locally harvested morcellized autograft, Vitoss BA with bone marrow aspirate, and infuse  SURGEON:  Surgeon(s): Hewitt Shorts, MD Barnett Abu, MD  ASSISTANTS: Barnett Abu, M.D.  ANESTHESIA:   general  EBL:  Total I/O In: 2850 [I.V.:2600; IV Piggyback:250] Out: 650 [Urine:400; Blood:250]  BLOOD ADMINISTERED:none  CELL SAVER GIVEN: The Cell Saver was used during the procedure, but the Cell Saver technician felt that there was insufficient blood loss to process the collected blood.  COUNT: Correct per nursing staff  DICTATION: Patient is brought to the operating room placed under general endotracheal anesthesia. The patient was turned  to prone position the lumbar region was prepped with Betadine soap and solution and draped in a sterile fashion. The midline was infiltrated with local anesthesia with epinephrine. A localizing x-ray was taken and then a midline incision was made, through the previous midline incision, carried down through the subcutaneous tissue, bipolar cautery and electrocautery were used to maintain hemostasis. Dissection was carried down to the lumbar fascia. The fascia was incised bilaterally and the paraspinal muscles were dissected with a spinous process and lamina in a subperiosteal fashion. Particular care was taken and the left side, where a previous left Gill procedure had been done earlier this year. Another x-ray was taken for localization and the L5-S1 level was localized. Dissection was then carried out laterally over the facet complex and the transverse processes of L5 and the ala of S1 were exposed and decorticated. The decompression was begun by removing the loose posterior element of L5. It was freed up from the ligamentum flavum as well as from the right L5-S1 facet capsule, as well as from the scar tissue on the left side, where the previous Gill procedure had been done. We identified the thecal sac and proceeded with a right L5-S1 facetectomy and foraminotomies, so as to decompress the stenotic compression of the exiting right L5 and S1 nerve roots. On the left side and this dissection was more difficult because scar tissue and the previous surgical plane. We identified lateral bony structures and then worked her way medially identifying the thecal sac and eventually the pedicle of L5, and then the exiting left L5 nerve root. Scar tissue and pseudoarthrosis were removed so as to decompress the stenotic compression of the exiting left L5 nerve root. A foraminotomy was also performed for the stenotic compression of the exiting left S1 nerve root.  Once the decompression was completed we proceeded with the  posterior lumbar interbody arthrodesis. The annulus was incised bilaterally  and the disc space entered. The significant grade 1 spondylolisthesis was noted. There is significant disc protrusion extending rostrally behind the body of L5. This was carefully removed from the surrounding epidural tissues, and we thereby to decompress the thecal sac and further decompress the exiting nerve roots. A thorough discectomy was performed using pituitary rongeurs and curettes. Once the discectomy was completed we began to prepare the endplate surfaces removing the cartilaginous endplates surfaces. We then measured the height of the intervertebral disc space. There was significant fishmouthing of the disc space. We selected 11 x 20 x 8 AVS peek interbody implants.  The C-arm fluoroscope was then draped and brought in the field and we identified the pedicle entry points bilaterally at the L5 and S1 levels. Each of the 4 pedicles was probed, we aspirated bone marrow aspirate from the vertebral bodies, this was injected over a 10 cc strip of Vitoss BA. Then each of the pedicles was examined with the ball probe good bony surfaces were found and no bony cuts were found. Each of the pedicles was then tapped with a 5.25 mm tap, again examined with the ball probe good threading was found and no bony cuts were found. We then placed 5.75 by 35 millimeter screws bilaterally at the S1 level, and we placed a 5.75 x 50 mm screw on the left at L5, and a 5.75 x 45 lower screw on the right L5.  We then packed the AVS peek interbody implants with Vitoss BA with bone marrow aspirate and infuse, and then placed the first implant and on the right side, carefully retracting the thecal sac and nerve root medially. We then went back to the left side and packed the midline with local harvested morcellized autograft, additional Vitoss with bone marrow aspirate, and infuse, and then placed a second implant and on the left side again retracting the  thecal sac and nerve root medially. Placement of the interbody implants was done with C-arm fluoroscopic guidance, which confirmed good countersinking relative to both L5 and S1.  We then packed the lateral gutter over the transverse processes and intertransverse space with locally harvested morcellized autograft, Vitoss BA with bone marrow aspirate, and infuse. We then selected pre-lordosed 30 mm rods, they were placed within the screw heads and secured with locking caps once all 4 locking caps were placed final tightening was performed against a counter torque.  The wound had been irrigated multiple times during the procedure with saline solution and bacitracin solution, good hemostasis was established with a combination of bipolar cautery and Gelfoam with thrombin. Once good hemostasis was confirmed we proceeded with closure paraspinal muscles deep fascia and Scarpa's fascia were closed with interrupted undyed 1 Vicryl sutures the subcutaneous and subcuticular closed with interrupted inverted 2-0 and 3-0 undyed Vicryl sutures the skin edges were approximated with Dermabond. The wound was dressed with sterile gauze and Hypafix.  Following surgery the patient was turned back to the supine position to be reversed and the anesthetic extubated and transferred to the recovery room for further care.   PLAN OF CARE: Admit to inpatient   PATIENT DISPOSITION:  PACU - hemodynamically stable.   Delay start of Pharmacological VTE agent (>24hrs) due to surgical blood loss or risk of bleeding:  yes

## 2013-05-12 MED ORDER — OXYCODONE HCL ER 10 MG PO T12A
10.0000 mg | EXTENDED_RELEASE_TABLET | Freq: Two times a day (BID) | ORAL | Status: DC
  Administered 2013-05-12 – 2013-05-13 (×3): 10 mg via ORAL
  Filled 2013-05-12 (×3): qty 1

## 2013-05-12 NOTE — Progress Notes (Signed)
Filed Vitals:   05/11/13 1850 05/11/13 1909 05/11/13 2342 05/12/13 0400  BP:  163/84 166/62 160/73  Pulse:  63 84 100  Temp: 97.7 F (36.5 C) 97.6 F (36.4 C) 99.4 F (37.4 C) 99.6 F (37.6 C)  TempSrc:   Oral Oral  Resp:  16 20 18   SpO2:  94% 93% 93%    CBC  Recent Labs  05/11/13 0840  HGB 15.0  HCT 44.0   BMET  Recent Labs  05/11/13 0840  NA 142  K 4.0  GLUCOSE 92    Patient up and ambulating. Foley DC'd this morning, has not yet voided. Dressing clean and dry. Good relief of disabling radicular pain.  Plan: Encouraged to ambulate several times to the hallways today. We'll decrease OxyContin to 10 mg every 12 hours. Continued to progress through postoperative recovery.  Hewitt Shorts, MD 05/12/2013, 8:34 AM

## 2013-05-12 NOTE — Progress Notes (Signed)
UR COMPLETED  

## 2013-05-13 MED ORDER — OXYCODONE-ACETAMINOPHEN 5-325 MG PO TABS: 1.0000 | ORAL_TABLET | ORAL | Status: DC | PRN

## 2013-05-13 MED ORDER — OXYCODONE HCL ER 10 MG PO T12A: 10.0000 mg | EXTENDED_RELEASE_TABLET | Freq: Two times a day (BID) | ORAL | Status: DC

## 2013-05-13 MED FILL — Heparin Sodium (Porcine) Inj 1000 Unit/ML: INTRAMUSCULAR | Qty: 30 | Status: AC

## 2013-05-13 MED FILL — Sodium Chloride IV Soln 0.9%: INTRAVENOUS | Qty: 1000 | Status: AC

## 2013-05-13 NOTE — Progress Notes (Signed)
Pt. Alert and oriented, follows simple instructions, denies pain. Incision area without swelling, redness or S/S of infection. Voiding adequate clear yellow urine. Moving all extremities well and vitals stable and documented. Lumbar Fusion surgery notes instructions given to patient and family member for home safety and precautions. Pt. and family stated understanding of instructions given.  

## 2013-05-13 NOTE — Discharge Summary (Signed)
Physician Discharge Summary  Patient ID: [name] MRN: [id number] DOB/AGE: 1932-05-12 79 y.o.  Admit date: 05/11/2013 Discharge date: 05/13/2013  Admission Diagnoses:  Left L5-S1 neural foraminal lumbar stenosis with disabling left lumbar radiculopathy, bilateral L5 pars interarticularis defect, grade 1 spondylolisthesis of L5 on S1, lumbar HNP, lumbar degenerative disc disease, lumbar spondylosis  Discharge Diagnoses:  Left L5-S1 neural foraminal lumbar stenosis with disabling left lumbar radiculopathy, bilateral L5 pars interarticularis defect, grade 1 spondylolisthesis of L5 on S1, lumbar HNP, lumbar degenerative disc disease, lumbar spondylosis  Discharged Condition: good  Hospital Course: Patient was admitted underwent a L5-S1 lumbar decompression and arthrodesis. Postoperatively he is making steady progress. Up and ambulate actively in the halls. The dressing was removed, and the wound is healing nicely. She's been given instructions regarding wound care and activities following discharge. He is to return for followup with me in 3 weeks.  Discharge Exam: Blood pressure 111/64, pulse 78, temperature 98.1 F (36.7 C), temperature source Oral, resp. rate 20, SpO2 92.00%.  Disposition: Home   Future Appointments Provider Department Dept Phone   07/08/2013 11:45 AM Stacie Glaze, MD [facility] [phone]       Medication List    STOP taking these medications       predniSONE 20 MG tablet  Commonly known as:  DELTASONE      TAKE these medications       diltiazem 300 MG 24 hr capsule  Commonly known as:  CARDIZEM CD  Take 300 mg by mouth daily.     OxyCODONE 10 mg T12a 12 hr tablet  Commonly known as:  OXYCONTIN  Take 1 tablet (10 mg total) by mouth every 12 (twelve) hours.     oxyCODONE-acetaminophen 5-325 MG per tablet  Commonly known as:  PERCOCET/ROXICET  Take 1-2 tablets by mouth every 4 (four) hours as needed for pain.     rosuvastatin  20 MG tablet  Commonly known as:  CRESTOR  Take 20 mg by mouth once a week. On Friday     telmisartan 80 MG tablet  Commonly known as:  MICARDIS  Take 40 mg by mouth daily.         Signed: Hewitt Shorts, MD 05/13/2013, 11:34 AM

## 2013-05-18 DIAGNOSIS — IMO0002 Reserved for concepts with insufficient information to code with codable children: Secondary | ICD-10-CM | POA: Diagnosis not present

## 2013-05-18 DIAGNOSIS — T85890A Other specified complication of nervous system prosthetic devices, implants and grafts, initial encounter: Secondary | ICD-10-CM | POA: Diagnosis not present

## 2013-05-18 DIAGNOSIS — M545 Low back pain, unspecified: Secondary | ICD-10-CM | POA: Diagnosis not present

## 2013-05-20 ENCOUNTER — Encounter (HOSPITAL_COMMUNITY): Payer: Self-pay | Admitting: Emergency Medicine

## 2013-05-20 ENCOUNTER — Inpatient Hospital Stay (HOSPITAL_COMMUNITY)
Admission: EM | Admit: 2013-05-20 | Discharge: 2013-05-26 | DRG: 909 | Disposition: A | Discharge status: Home / Self Care | Payer: Medicare Other | Attending: Neurosurgery | Admitting: Neurosurgery

## 2013-05-20 DIAGNOSIS — N400 Enlarged prostate without lower urinary tract symptoms: Secondary | ICD-10-CM | POA: Diagnosis present

## 2013-05-20 DIAGNOSIS — Z981 Arthrodesis status: Secondary | ICD-10-CM | POA: Diagnosis not present

## 2013-05-20 DIAGNOSIS — M545 Low back pain, unspecified: Secondary | ICD-10-CM | POA: Diagnosis present

## 2013-05-20 DIAGNOSIS — Z96659 Presence of unspecified artificial knee joint: Secondary | ICD-10-CM

## 2013-05-20 DIAGNOSIS — T85698A Other mechanical complication of other specified internal prosthetic devices, implants and grafts, initial encounter: Principal | ICD-10-CM | POA: Diagnosis present

## 2013-05-20 DIAGNOSIS — M79609 Pain in unspecified limb: Secondary | ICD-10-CM | POA: Diagnosis not present

## 2013-05-20 DIAGNOSIS — IMO0002 Reserved for concepts with insufficient information to code with codable children: Secondary | ICD-10-CM | POA: Diagnosis present

## 2013-05-20 DIAGNOSIS — T84498A Other mechanical complication of other internal orthopedic devices, implants and grafts, initial encounter: Secondary | ICD-10-CM | POA: Diagnosis not present

## 2013-05-20 DIAGNOSIS — I2510 Atherosclerotic heart disease of native coronary artery without angina pectoris: Secondary | ICD-10-CM | POA: Diagnosis present

## 2013-05-20 DIAGNOSIS — I10 Essential (primary) hypertension: Secondary | ICD-10-CM | POA: Diagnosis present

## 2013-05-20 DIAGNOSIS — Z85828 Personal history of other malignant neoplasm of skin: Secondary | ICD-10-CM | POA: Diagnosis not present

## 2013-05-20 DIAGNOSIS — E785 Hyperlipidemia, unspecified: Secondary | ICD-10-CM | POA: Diagnosis present

## 2013-05-20 DIAGNOSIS — M109 Gout, unspecified: Secondary | ICD-10-CM | POA: Diagnosis present

## 2013-05-20 DIAGNOSIS — M47817 Spondylosis without myelopathy or radiculopathy, lumbosacral region: Secondary | ICD-10-CM | POA: Diagnosis not present

## 2013-05-20 DIAGNOSIS — Z87891 Personal history of nicotine dependence: Secondary | ICD-10-CM | POA: Diagnosis not present

## 2013-05-20 DIAGNOSIS — M5410 Radiculopathy, site unspecified: Secondary | ICD-10-CM

## 2013-05-20 HISTORY — DX: Unspecified osteoarthritis, unspecified site: M19.90

## 2013-05-20 LAB — BASIC METABOLIC PANEL
BUN: 11 mg/dL (ref 6–23)
CO2: 22 mEq/L (ref 19–32)
Calcium: 8.9 mg/dL (ref 8.4–10.5)
Chloride: 106 mEq/L (ref 96–112)
Creatinine, Ser: 0.93 mg/dL (ref 0.50–1.35)
GFR calc Af Amer: >90 mL/min (ref >90)
GFR calc non Af Amer: 78 mL/min — ABNORMAL LOW (ref >90)
Glucose, Bld: 95 mg/dL (ref 70–99)
Potassium: 3.7 mEq/L (ref 3.5–5.1)
Sodium: 141 mEq/L (ref 135–145)

## 2013-05-20 LAB — CBC WITH DIFFERENTIAL/PLATELET
Basophils Absolute: 0 10*3/uL (ref 0.0–0.1)
Basophils Relative: 0 % (ref 0–1)
Eosinophils Absolute: 0.3 10*3/uL (ref 0.0–0.7)
Eosinophils Relative: 3 % (ref 0–5)
HCT: 37 % — ABNORMAL LOW (ref 39.0–52.0)
Hemoglobin: 12.6 g/dL — ABNORMAL LOW (ref 13.0–17.0)
Lymphocytes Relative: 26 % (ref 12–46)
Lymphs Abs: 2.2 10*3/uL (ref 0.7–4.0)
MCH: 29.5 pg (ref 26.0–34.0)
MCHC: 34.1 g/dL (ref 30.0–36.0)
MCV: 86.7 fL (ref 78.0–100.0)
Monocytes Absolute: 0.6 10*3/uL (ref 0.1–1.0)
Monocytes Relative: 8 % (ref 3–12)
Neutro Abs: 5.3 10*3/uL (ref 1.7–7.7)
Neutrophils Relative %: 64 % (ref 43–77)
Platelets: 559 10*3/uL — ABNORMAL HIGH (ref 150–400)
RBC: 4.27 MIL/uL (ref 4.22–5.81)
RDW: 13.1 % (ref 11.5–15.5)
WBC: 8.4 10*3/uL (ref 4.0–10.5)

## 2013-05-20 MED ORDER — CYCLOBENZAPRINE HCL 10 MG PO TABS
10.0000 mg | ORAL_TABLET | Freq: Three times a day (TID) | ORAL | Status: DC | PRN
  Administered 2013-05-20 – 2013-05-26 (×10): 10 mg via ORAL
  Filled 2013-05-20 (×10): qty 1

## 2013-05-20 MED ORDER — HYDROXYZINE HCL 25 MG PO TABS: 50.0000 mg | ORAL_TABLET | ORAL | Status: DC | PRN

## 2013-05-20 MED ORDER — HYDROCODONE-ACETAMINOPHEN 5-325 MG PO TABS: 1.0000 | ORAL_TABLET | ORAL | Status: DC | PRN

## 2013-05-20 MED ORDER — MENTHOL 3 MG MT LOZG: 1.0000 | LOZENGE | OROMUCOSAL | Status: DC | PRN

## 2013-05-20 MED ORDER — ALUM & MAG HYDROXIDE-SIMETH 200-200-20 MG/5ML PO SUSP: 30.0000 mL | Freq: Four times a day (QID) | ORAL | Status: DC | PRN

## 2013-05-20 MED ORDER — IRBESARTAN 300 MG PO TABS
300.0000 mg | ORAL_TABLET | Freq: Every day | ORAL | Status: DC
  Administered 2013-05-21 – 2013-05-25 (×5): 300 mg via ORAL
  Filled 2013-05-20 (×6): qty 1

## 2013-05-20 MED ORDER — MORPHINE SULFATE 4 MG/ML IJ SOLN
4.0000 mg | INTRAMUSCULAR | Status: DC | PRN
  Administered 2013-05-20 – 2013-05-22 (×8): 8 mg via INTRAMUSCULAR
  Administered 2013-05-22 (×3): 4 mg via INTRAMUSCULAR
  Administered 2013-05-22: 8 mg via INTRAMUSCULAR
  Administered 2013-05-23: 4 mg via INTRAMUSCULAR
  Administered 2013-05-23 (×2): 6 mg via INTRAMUSCULAR
  Filled 2013-05-20: qty 2
  Filled 2013-05-20: qty 1
  Filled 2013-05-20 (×4): qty 2
  Filled 2013-05-20 (×2): qty 1
  Filled 2013-05-20 (×3): qty 2
  Filled 2013-05-20: qty 1
  Filled 2013-05-20 (×3): qty 2

## 2013-05-20 MED ORDER — ACETAMINOPHEN 325 MG PO TABS: 650.0000 mg | ORAL_TABLET | ORAL | Status: DC | PRN

## 2013-05-20 MED ORDER — CEPHALEXIN 500 MG PO CAPS
500.0000 mg | ORAL_CAPSULE | Freq: Three times a day (TID) | ORAL | Status: DC
  Administered 2013-05-20 – 2013-05-25 (×16): 500 mg via ORAL
  Filled 2013-05-20 (×14): qty 1
  Filled 2013-05-20: qty 2
  Filled 2013-05-20 (×5): qty 1

## 2013-05-20 MED ORDER — SODIUM CHLORIDE 0.9 % IV SOLN: 250.0000 mL | INTRAVENOUS | Status: DC

## 2013-05-20 MED ORDER — ROSUVASTATIN CALCIUM 20 MG PO TABS
20.0000 mg | ORAL_TABLET | ORAL | Status: DC
  Administered 2013-05-22: 20 mg via ORAL
  Filled 2013-05-20: qty 1

## 2013-05-20 MED ORDER — BISACODYL 10 MG RE SUPP
10.0000 mg | Freq: Every day | RECTAL | Status: DC | PRN
  Administered 2013-05-21: 10 mg via RECTAL
  Filled 2013-05-20: qty 1

## 2013-05-20 MED ORDER — SODIUM CHLORIDE 0.9 % IJ SOLN: 3.0000 mL | INTRAMUSCULAR | Status: DC | PRN

## 2013-05-20 MED ORDER — SODIUM CHLORIDE 0.9 % IJ SOLN
3.0000 mL | Freq: Two times a day (BID) | INTRAMUSCULAR | Status: DC
  Administered 2013-05-20 – 2013-05-25 (×11): 3 mL via INTRAVENOUS

## 2013-05-20 MED ORDER — PHENOL 1.4 % MT LIQD: 1.0000 | OROMUCOSAL | Status: DC | PRN

## 2013-05-20 MED ORDER — DILTIAZEM HCL ER COATED BEADS 300 MG PO CP24
300.0000 mg | ORAL_CAPSULE | Freq: Every day | ORAL | Status: DC
  Administered 2013-05-21 – 2013-05-25 (×5): 300 mg via ORAL
  Filled 2013-05-20 (×6): qty 1

## 2013-05-20 MED ORDER — ATORVASTATIN CALCIUM 40 MG PO TABS: 40.0000 mg | ORAL_TABLET | Freq: Every day | ORAL | Status: DC

## 2013-05-20 MED ORDER — ONDANSETRON HCL 4 MG/2ML IJ SOLN: 4.0000 mg | Freq: Four times a day (QID) | INTRAMUSCULAR | Status: DC | PRN

## 2013-05-20 MED ORDER — OXYCODONE-ACETAMINOPHEN 5-325 MG PO TABS
1.0000 | ORAL_TABLET | ORAL | Status: DC | PRN
  Administered 2013-05-20 – 2013-05-26 (×20): 2 via ORAL
  Filled 2013-05-20 (×20): qty 2

## 2013-05-20 MED ORDER — ACETAMINOPHEN 650 MG RE SUPP
650.0000 mg | RECTAL | Status: DC | PRN
  Filled 2013-05-20: qty 1

## 2013-05-20 MED ORDER — ZOLPIDEM TARTRATE 5 MG PO TABS: 5.0000 mg | ORAL_TABLET | Freq: Every evening | ORAL | Status: DC | PRN

## 2013-05-20 MED ORDER — MAGNESIUM HYDROXIDE 400 MG/5ML PO SUSP
30.0000 mL | Freq: Every day | ORAL | Status: DC | PRN
  Administered 2013-05-24: 30 mL via ORAL
  Filled 2013-05-20: qty 30

## 2013-05-20 MED ORDER — HYDROMORPHONE HCL PF 2 MG/ML IJ SOLN
2.0000 mg | Freq: Once | INTRAMUSCULAR | Status: AC
  Administered 2013-05-20: 2 mg via INTRAMUSCULAR
  Filled 2013-05-20: qty 1

## 2013-05-20 NOTE — ED Notes (Addendum)
Pt had recent back sx by Dr Bettina Gavia and now havng pain in left buttocks with radiation down left leg; pt sts similar to pain prior to sx; pt denies incontinence or numbness

## 2013-05-20 NOTE — ED Notes (Signed)
Regular Diet ordered for pt.

## 2013-05-20 NOTE — ED Notes (Signed)
Received phone call from Dr. Newell Coral, lab orders received.  St's he will be here to assess pt for admission

## 2013-05-20 NOTE — ED Provider Notes (Signed)
CSN: [id number]     Arrival date & time 05/20/13  1412 History   First MD Initiated Contact with Patient 05/20/13 1610     Chief Complaint  Patient presents with  . Leg Pain   (Consider location/radiation/quality/duration/timing/severity/associated sxs/prior Treatment) HPI Comments: 79 year old male presents with recurring left-sided buttocks pain radiating down his leg. This pain is the exact same as what required a recent L5/S1 decompression 10 days ago. He had minimal relief just after the surgery and now is getting back to his preoperative pain. He denies any numbness, weakness, trouble walking, bowel or bladder incontinence, or fever. He has had some redness on the skin surrounding the site of incision and is currently on Keflex for that. He saw his surgeon, Dr. Newell Coral 2 days ago he was scheduling outpatient imaging. However he couldn't take the pain today so he called Dr. Newell Coral was told to come to the ER. Patient states he's taking 2-3 of his 10 mg oxycodone without relief every 3-4 hours.  The history is provided by the patient and a relative.    Past Medical History  Diagnosis Date  . ACTINIC KERATOSIS, HEAD 03/23/2008  . ALLERGIC RHINITIS 03/27/2007  . BENIGN PROSTATIC HYPERTROPHY, WITH URINARY OBSTRUCTION 10/16/2007  . CARPAL TUNNEL SYNDROME, LEFT 04/13/2008  . ERECTILE DYSFUNCTION, ORGANIC 02/20/2010  . Gout, unspecified 02/20/2010  . HYPERLIPIDEMIA, WITH HIGH HDL 10/18/2008  . KNEE PAIN 10/10/2009  . LOW BACK PAIN 03/27/2007  . NEOPLASM, MALIGNANT, SKIN, TRUNK 05/09/2007  . Patellar tendinitis 10/10/2009  . SKIN CANCER, HX OF 03/27/2007  . UNS ADVRS EFF UNS RX MEDICINAL&BIOLOGICAL SBSTNC 08/18/2007  . HYPERTENSION 05/09/2007    sees Dr. Darryll Capers  . Headache(784.0)     migraines hx of  . Chronic neck pain   . Chronic back pain   . CAD 08/18/2007    "patient denies any issues with heart, does not see cardiologist"  . Urinary urgency   . History of melanoma    Past Surgical  History  Procedure Laterality Date  . Knee arthroscopy    . Cholecystectomy    . Nasal sinus surgery    . Vasectomy    . Lumbar fusions    . Joint replacement surgery    . Rectal fissure    . Lateral fusion lumbar spine, transverse    . Laminectomy    . Lumbar fusion    . Rectal fissure    . Vitrectomy      right and left 2013  . Carpal tunnel release    . Eye surgery      bilateral cataracts removed and vitrectomies  . Blepharoplasty    . Back surgery    . Posterior cervical fusion/foraminotomy Right 10/01/2012    Procedure: POSTERIOR CERVICAL FUSION/FORAMINOTOMY LEVEL 1;  Surgeon: Hewitt Shorts, MD;  Location: [facility];  Service: Neurosurgery;  Laterality: Right;  Right Cervical seven thoracic one Cervical laminectomy/foraminotomy with posterior cervical arthrodesis   . Joint replacement Right     knee  . Appendectomy  1969  . Lumbar laminectomy/decompression microdiscectomy Left 11/03/2012    Procedure: LUMBAR LAMINECTOMY/DECOMPRESSION MICRODISCECTOMY 1 LEVEL;  Surgeon: Hewitt Shorts, MD;  Location: [facility];  Service: Neurosurgery;  Laterality: Left;  LEFT L5S1 laminotomy foraminotomy and possible microdiskectomy  . Melanoma excision     Family History  Problem Relation Age of Onset  . Dementia Mother   . Anemia Father   . Heart disease Father    History  Substance Use  Topics  . Smoking status: Former Smoker -- 1.00 packs/day for 33 years    Quit date: 09/29/1981  . Smokeless tobacco: Not on file  . Alcohol Use: 1.5 oz/week    3 drink(s) per week     Comment: daily    Review of Systems  Constitutional: Negative for fever.  Gastrointestinal: Negative for nausea and vomiting.  Genitourinary: Negative for dysuria and difficulty urinating.  Musculoskeletal: Positive for back pain.  Skin: Positive for color change (redness around wound).  Neurological: Negative for weakness and numbness.  All other systems reviewed and are negative.    Allergies    Gabapentin  Home Medications   Current Outpatient Rx  Name  Route  Sig  Dispense  Refill  . diltiazem (CARDIZEM CD) 300 MG 24 hr capsule   Oral   Take 300 mg by mouth daily.         . OxyCODONE (OXYCONTIN) 10 mg T12A 12 hr tablet   Oral   Take 1 tablet (10 mg total) by mouth every 12 (twelve) hours.   10 tablet   0   . rosuvastatin (CRESTOR) 20 MG tablet   Oral   Take 20 mg by mouth once a week. On Friday         . telmisartan (MICARDIS) 80 MG tablet   Oral   Take 40 mg by mouth daily.          BP 210/107  Pulse 87  Temp(Src) 98.2 F (36.8 C) (Oral)  Resp 24  Ht 6' (1.829 m)  Wt 200 lb 4.8 oz (90.855 kg)  BMI 27.16 kg/m2  SpO2 94% Physical Exam  Nursing note and vitals reviewed. Constitutional: He is oriented to person, place, and time. He appears well-developed and well-nourished.  HENT:  Head: Normocephalic and atraumatic.  Right Ear: External ear normal.  Left Ear: External ear normal.  Nose: Nose normal.  Eyes: Right eye exhibits no discharge. Left eye exhibits no discharge.  Neck: Neck supple.  Pulmonary/Chest: Effort normal.  Abdominal: Soft. There is no tenderness.  Musculoskeletal: He exhibits no edema.       Lumbar back: He exhibits no tenderness.       Back:  Neurological: He is alert and oriented to person, place, and time. He has normal strength. No sensory deficit.  Skin: Skin is warm and dry.    ED Course  Procedures (including critical care time) Labs Review Labs Reviewed  CULTURE, BLOOD (ROUTINE X 2)  CULTURE, BLOOD (ROUTINE X 2)  CBC WITH DIFFERENTIAL  BASIC METABOLIC PANEL   Imaging Review No results found.  MDM   1. Radicular low back pain    D/w Dr. Newell Coral, who is aware of patient and will admit. Will get inpatient CT scan and give pain control. No signs of acute cauda equina or abscess at this time.    Audree Camel, MD 05/20/13 [phone]

## 2013-05-20 NOTE — H&P (Signed)
Subjective: Patient is a 79 y.o. male who is admitted for treatment of disabling left lumbar radicular pain.  He is one a half weeks status post bilateral L5-S1 lumbar decompression, PLIF, and PLA. Patient did well following surgery with good relief of his left lumbar radicular pain, but he has subsequently developed increasingly disabling recurrent left lumbar radicular pain. Patient was seen in the office 2 days ago. X-ray that day showed that one of the 2 interbody implants at migrated posteriorly, compared to the intraoperative x-ray. We had requested a CT scan of the lumbar spine without contrast as an outpatient to evaluate that further, but because of increasingly disabling pain, the patient presented to the [facility] hospital emergency room for further evaluation and care.  Symptomatically he describes disabling pain the left buttock, posterior thigh, and calf, with intermittent numbness in the toes of his left foot.  When we saw him in the office 2 days ago, there was mild erythema and swelling of his incision. He was started on Keflex 500 mg by mouth 3 times a day.  Patient Active Problem List   Diagnosis Date Noted  . Chronic radicular low back pain 12/15/2012  . Right elbow pain 07/18/2012  . GOUT, UNSPECIFIED 02/20/2010  . ERECTILE DYSFUNCTION, ORGANIC 02/20/2010  . KNEE PAIN 10/10/2009  . PATELLAR TENDINITIS 10/10/2009  . HYPERLIPIDEMIA, WITH HIGH HDL 10/18/2008  . CARPAL TUNNEL SYNDROME, LEFT 04/13/2008  . ACTINIC KERATOSIS, HEAD 03/23/2008  . BENIGN PROSTATIC HYPERTROPHY, WITH URINARY OBSTRUCTION 10/16/2007  . CAD 08/18/2007  . UNS ADVRS EFF UNS RX MEDICINAL&BIOLOGICAL SBSTNC 08/18/2007  . NEOPLASM, MALIGNANT, SKIN, TRUNK 05/09/2007  . HYPERTENSION 05/09/2007  . ALLERGIC RHINITIS 03/27/2007  . LOW BACK PAIN 03/27/2007  . SKIN CANCER, HX OF 03/27/2007   Past Medical History  Diagnosis Date  . ACTINIC KERATOSIS, HEAD 03/23/2008  . ALLERGIC RHINITIS 03/27/2007  . BENIGN  PROSTATIC HYPERTROPHY, WITH URINARY OBSTRUCTION 10/16/2007  . CARPAL TUNNEL SYNDROME, LEFT 04/13/2008  . ERECTILE DYSFUNCTION, ORGANIC 02/20/2010  . Gout, unspecified 02/20/2010  . HYPERLIPIDEMIA, WITH HIGH HDL 10/18/2008  . KNEE PAIN 10/10/2009  . LOW BACK PAIN 03/27/2007  . NEOPLASM, MALIGNANT, SKIN, TRUNK 05/09/2007  . Patellar tendinitis 10/10/2009  . SKIN CANCER, HX OF 03/27/2007  . UNS ADVRS EFF UNS RX MEDICINAL&BIOLOGICAL SBSTNC 08/18/2007  . HYPERTENSION 05/09/2007    sees Dr. Darryll Capers  . Headache(784.0)     migraines hx of  . Chronic neck pain   . Chronic back pain   . CAD 08/18/2007    "patient denies any issues with heart, does not see cardiologist"  . Urinary urgency   . History of melanoma     Past Surgical History  Procedure Laterality Date  . Knee arthroscopy    . Cholecystectomy    . Nasal sinus surgery    . Vasectomy    . Lumbar fusions    . Joint replacement surgery    . Rectal fissure    . Lateral fusion lumbar spine, transverse    . Laminectomy    . Lumbar fusion    . Rectal fissure    . Vitrectomy      right and left 2013  . Carpal tunnel release    . Eye surgery      bilateral cataracts removed and vitrectomies  . Blepharoplasty    . Back surgery    . Posterior cervical fusion/foraminotomy Right 10/01/2012    Procedure: POSTERIOR CERVICAL FUSION/FORAMINOTOMY LEVEL 1;  Surgeon: Hewitt Shorts, MD;  Location: [facility];  Service: Neurosurgery;  Laterality: Right;  Right Cervical seven thoracic one Cervical laminectomy/foraminotomy with posterior cervical arthrodesis   . Joint replacement Right     knee  . Appendectomy  1969  . Lumbar laminectomy/decompression microdiscectomy Left 11/03/2012    Procedure: LUMBAR LAMINECTOMY/DECOMPRESSION MICRODISCECTOMY 1 LEVEL;  Surgeon: Hewitt Shorts, MD;  Location: [facility];  Service: Neurosurgery;  Laterality: Left;  LEFT L5S1 laminotomy foraminotomy and possible microdiskectomy  . Melanoma excision       (Not  in a hospital admission) Allergies  Allergen Reactions  . Gabapentin     Lower extremity edema    History  Substance Use Topics  . Smoking status: Former Smoker -- 1.00 packs/day for 33 years    Quit date: 09/29/1981  . Smokeless tobacco: Not on file  . Alcohol Use: 1.5 oz/week    3 drink(s) per week     Comment: daily    Family History  Problem Relation Age of Onset  . Dementia Mother   . Anemia Father   . Heart disease Father      Review of Systems A comprehensive review of systems was negative.  Objective: Vital signs in last 24 hours: Temp:  [98.2 F (36.8 C)] 98.2 F (36.8 C) (10/08 1419) Pulse Rate:  [68-87] 68 (10/08 1716) Resp:  [24] 24 (10/08 1419) BP: (194-210)/(80-107) 194/80 mmHg (10/08 1715) SpO2:  [94 %-99 %] 99 % (10/08 1716) Weight:  [90.855 kg (200 lb 4.8 oz)] 90.855 kg (200 lb 4.8 oz) (10/08 1419)  EXAM: Patient is a well-developed well-nourished white male, in discomfort, but no acute distress. Lungs are clear to auscultation , the patient has symmetrical respiratory excursion. Heart has a regular rate and rhythm normal S1 and S2 no murmur.   Abdomen is soft nontender nondistended bowel sounds are present. Extremity examination shows no clubbing cyanosis or edema. External examination again shows the wound with mild swelling and dull erythema, more to the right than the left side. There is no drainage or purulence. Motor examination shows 5/5 strength in the lower extremities, including the iliopsoas, quadriceps, dorsiflexor, EHL, and plantar flexor bilaterally. Sensation is intact to pinprick in the distal lower extremities. Reflexes are minimal at the quadriceps and gastrocnemius. Toes are downgoing bilaterally.  Data Review:CBC    Component Value Date/Time   WBC 8.0 05/05/2013 0934   RBC 4.94 05/05/2013 0934   HGB 15.0 05/11/2013 0840   HCT 44.0 05/11/2013 0840   PLT 449* 05/05/2013 0934   MCV 90.7 05/05/2013 0934   MCH 30.2 05/05/2013 0934   MCHC 33.3  05/05/2013 0934   RDW 13.5 05/05/2013 0934   LYMPHSABS 1.6 12/26/2012 1056   MONOABS 0.5 12/26/2012 1056   EOSABS 0.2 12/26/2012 1056   BASOSABS 0.0 12/26/2012 1056                          BMET    Component Value Date/Time   NA 142 05/11/2013 0840   K 4.0 05/11/2013 0840   CL 103 05/05/2013 0934   CO2 28 05/05/2013 0934   GLUCOSE 92 05/11/2013 0840   BUN 20 05/05/2013 0934   CREATININE 1.14 05/05/2013 0934   CALCIUM 9.8 05/05/2013 0934   GFRNONAA 59* 05/05/2013 0934   GFRAA 69* 05/05/2013 0934     Assessment/Plan: Patient 9 days status post L5-S1 lumbar decompression and arthrodesis, with recurrent disabling left lumbar radicular pain. X-ray shows that one of the  interbody implants has migrated posteriorly, and I'm concerned that may be causing nerve impingement. Currently the patient is not able to manage his pain at home, and is admitted for pain management and further workup including CT scan of the lumbar spine without contrast.   Hewitt Shorts, MD 05/20/2013 5:28 PM

## 2013-05-21 ENCOUNTER — Inpatient Hospital Stay (HOSPITAL_COMMUNITY): Payer: Medicare Other

## 2013-05-21 DIAGNOSIS — M47817 Spondylosis without myelopathy or radiculopathy, lumbosacral region: Secondary | ICD-10-CM | POA: Diagnosis not present

## 2013-05-21 NOTE — Progress Notes (Signed)
Nutrition Brief Note  Patient identified on the Malnutrition Screening Tool (MST) Report for recent weight lost without trying.  Per readings below, patient's weight has been stable.  Wt Readings from Last 15 Encounters:  05/20/13 200 lb 4.8 oz (90.855 kg)  05/05/13 197 lb (89.359 kg)  03/16/13 203 lb (92.08 kg)  12/15/12 203 lb (92.08 kg)  10/29/12 199 lb 4.8 oz (90.402 kg)  10/01/12 201 lb (91.173 kg)  10/01/12 201 lb (91.173 kg)  09/15/12 202 lb (91.627 kg)  08/12/12 200 lb (90.719 kg)  07/18/12 198 lb (89.812 kg)  12/26/11 208 lb (94.348 kg)  08/28/11 212 lb (96.163 kg)  04/26/11 208 lb (94.348 kg)  12/25/10 210 lb (95.255 kg)  09/29/10 212 lb (96.163 kg)    Body mass index is 27.16 kg/(m^2). Patient meets criteria for Overweight based on current BMI.   Current diet order is Regular. Labs and medications reviewed.   No nutrition interventions warranted at this time. If nutrition issues arise, please consult RD.   Maureen Chatters, RD, LDN Pager #: [phone] After-Hours Pager #: [phone]

## 2013-05-21 NOTE — Progress Notes (Signed)
Subjective: Patient resting in bed, has been out of bed to the bathroom, it is found sitting to exactly the most uncomfortable position. He is less uncomfortable standing and certainly less uncomfortable laying. Patient not wearing SCD despite recommendation to do so because of the radicular discomfort in the left leg.    CT done this morning and results reviewed with the patient. It shows that by left interbody implant is laterally positioned in the left, and the right interbody implant probably has migrated mildly posteriorly. I suspect he is symptomatic from neural compression on the left side.  Blood cultures x2 yesterday show no growth so far.  Objective: Vital signs in last 24 hours: Filed Vitals:   05/21/13 0025 05/21/13 0300 05/21/13 0349 05/21/13 0700  BP: 179/89 180/92 179/86 175/78  Pulse:  72 66 72  Temp:  97.8 F (36.6 C) 98.2 F (36.8 C) 98.1 F (36.7 C)  TempSrc:  Oral Oral Oral  Resp:  19 20 18   Height:      Weight:      SpO2:  96% 98% 97%    Intake/Output from previous day: 10/08 0701 - 10/09 0700 In: -  Out: 1600 [Urine:1600] Intake/Output this shift: Total I/O In: 240 [P.O.:240] Out: -   Physical Exam:  Wound unchanged, still with mild swelling, and dull erythema. There is no drainage.  CBC  Recent Labs  05/20/13 1703  WBC 8.4  HGB 12.6*  HCT 37.0*  PLT 559*   BMET  Recent Labs  05/20/13 1703  NA 141  K 3.7  CL 106  CO2 22  GLUCOSE 95  BUN 11  CREATININE 0.93  CALCIUM 8.9    Studies/Results: Ct Lumbar Spine Wo Contrast  05/21/2013   CLINICAL DATA:  Patient status post L5-S1 laminectomy and fusion 05/11/2013. Continued left buttock and leg pain.  EXAM: CT LUMBAR SPINE WITHOUT CONTRAST  TECHNIQUE: Multidetector CT imaging of the lumbar spine was performed without intravenous contrast administration. Multiplanar CT image reconstructions were also generated.  COMPARISON:  MRI lumbar spine 03/25/2013.  FINDINGS: Since the prior examination,  the patient has undergone L5-S1 laminectomy and fusion with pedicle screws and stabilization bars in place. Pedicle screws and stabilization bars are well positioned and intact. There is no lucency about the screws to suggest loosening or infection. No fluid collection is identified.  Vertebral body height is maintained. Mild convex left scoliosis is noted. Imaged intra-abdominal contents demonstrate aortic atherosclerosis.  L1-2: The central canal and neural foramina are widely patent. Extra-foraminal spurring and endplate spurring on the right could potentially contact the exited right L1 root. The appearance is unchanged.  L2-3: Shallow disc bulge to the right without central canal or foraminal narrowing, unchanged.  L3-4: There is facet degenerative disease and a small disc bulge. The central canal is patent. Osteophytosis off the superior aspect of the right facet joint encroaches on the exiting right L3 root. Mild left foraminal narrowing is identified. The appearance is unchanged.  L4-5: There is a shallow disc bulge. Facet degenerative change is noted. The central canal and right foramen appear open. Mild left foraminal narrowing is noted.  L5-S1: Status post wide laminectomy and fusion. The central canal is widely patent. A small segment of the interbody spacer on the left is partially uncovered superiorly and could potentially and impact the exiting left L5 root although left foraminal narrowing seen on the MRI appears markedly improved. The right foramen is widely patent.  IMPRESSION: Status post L5-S1 laminectomy and fusion. 0.9  cm of anterolisthesis of L5 on S1 is identified. The central canal is widely patent. Partial uncovering of the superior aspect of the left interbody spacer is noted and could potentially the impact the exiting left L5 root although left foraminal narrowing seen on the comparison MRI appears markedly improved after surgery.  No change in right foraminal narrowing at L3-4 due to  osteophytosis off the facet joint.   Electronically Signed   By: Drusilla Kanner M.D.   On: 05/21/2013 12:12    Assessment/Plan: Patient continues to have disabling left lumbar radicular pain. This pain may well be due to the current position of the left interbody implant. However I recommend the patient we evaluate this further with an MRI of the lumbar spine without and with gallium. He's agreed to that and we'll deal to give further recommendations after reviewing that study.   Hewitt Shorts, MD 05/21/2013, 1:26 PM

## 2013-05-21 NOTE — Progress Notes (Signed)
Patient in excruciating pain radiating to Left leg, worse with movement. Incision looks red, warm, mildly swollen and some drainage. No fever. Also has rashes all over the back and its itching. Lotion helped with itching.

## 2013-05-22 ENCOUNTER — Inpatient Hospital Stay (HOSPITAL_COMMUNITY): Payer: Medicare Other

## 2013-05-22 DIAGNOSIS — M47817 Spondylosis without myelopathy or radiculopathy, lumbosacral region: Secondary | ICD-10-CM | POA: Diagnosis not present

## 2013-05-22 MED ORDER — GADOBENATE DIMEGLUMINE 529 MG/ML IV SOLN
20.0000 mL | Freq: Once | INTRAVENOUS | Status: AC
  Administered 2013-05-22: 20 mL via INTRAVENOUS

## 2013-05-23 ENCOUNTER — Encounter (HOSPITAL_COMMUNITY): Payer: Self-pay | Admitting: Anesthesiology

## 2013-05-23 ENCOUNTER — Inpatient Hospital Stay (HOSPITAL_COMMUNITY): Payer: Medicare Other | Admitting: Anesthesiology

## 2013-05-23 ENCOUNTER — Encounter (HOSPITAL_COMMUNITY): Payer: Medicare Other | Admitting: Anesthesiology

## 2013-05-23 ENCOUNTER — Encounter (HOSPITAL_COMMUNITY)
Admission: EM | Disposition: A | Discharge status: Home / Self Care | Payer: Self-pay | Source: Home / Self Care | Attending: Neurosurgery

## 2013-05-23 DIAGNOSIS — E785 Hyperlipidemia, unspecified: Secondary | ICD-10-CM | POA: Diagnosis not present

## 2013-05-23 DIAGNOSIS — M545 Low back pain, unspecified: Secondary | ICD-10-CM | POA: Diagnosis not present

## 2013-05-23 DIAGNOSIS — IMO0002 Reserved for concepts with insufficient information to code with codable children: Secondary | ICD-10-CM | POA: Diagnosis not present

## 2013-05-23 DIAGNOSIS — T85698A Other mechanical complication of other specified internal prosthetic devices, implants and grafts, initial encounter: Secondary | ICD-10-CM | POA: Diagnosis not present

## 2013-05-23 DIAGNOSIS — T84498A Other mechanical complication of other internal orthopedic devices, implants and grafts, initial encounter: Secondary | ICD-10-CM | POA: Diagnosis not present

## 2013-05-23 HISTORY — PX: POSTERIOR LUMBAR FUSION: SHX6036

## 2013-05-23 LAB — GLUCOSE, CAPILLARY: Glucose-Capillary: 94 mg/dL (ref 70–99)

## 2013-05-23 LAB — MRSA PCR SCREENING: MRSA by PCR: NEGATIVE

## 2013-05-23 SURGERY — POSTERIOR LUMBAR FUSION
Anesthesia: General | Site: Back | Wound class: Clean

## 2013-05-23 MED ORDER — PROPOFOL 10 MG/ML IV BOLUS
INTRAVENOUS | Status: DC | PRN
  Administered 2013-05-23: 120 mg via INTRAVENOUS

## 2013-05-23 MED ORDER — PHENYLEPHRINE HCL 10 MG/ML IJ SOLN
INTRAMUSCULAR | Status: DC | PRN
  Administered 2013-05-23: 80 ug via INTRAVENOUS
  Administered 2013-05-23: 120 ug via INTRAVENOUS
  Administered 2013-05-23: 80 ug via INTRAVENOUS
  Administered 2013-05-23: 40 ug via INTRAVENOUS
  Administered 2013-05-23: 80 ug via INTRAVENOUS

## 2013-05-23 MED ORDER — HYDROMORPHONE HCL PF 1 MG/ML IJ SOLN
INTRAMUSCULAR | Status: AC
  Filled 2013-05-23: qty 1

## 2013-05-23 MED ORDER — KETOROLAC TROMETHAMINE 30 MG/ML IJ SOLN
INTRAMUSCULAR | Status: AC
  Filled 2013-05-23: qty 1

## 2013-05-23 MED ORDER — PHENYLEPHRINE HCL 10 MG/ML IJ SOLN
10.0000 mg | INTRAVENOUS | Status: DC | PRN
  Administered 2013-05-23: 10 ug/min via INTRAVENOUS

## 2013-05-23 MED ORDER — ROCURONIUM BROMIDE 100 MG/10ML IV SOLN
INTRAVENOUS | Status: DC | PRN
  Administered 2013-05-23: 50 mg via INTRAVENOUS

## 2013-05-23 MED ORDER — KETOROLAC TROMETHAMINE 30 MG/ML IJ SOLN
30.0000 mg | Freq: Four times a day (QID) | INTRAMUSCULAR | Status: AC
  Administered 2013-05-23 – 2013-05-25 (×9): 30 mg via INTRAVENOUS
  Filled 2013-05-23 (×9): qty 1

## 2013-05-23 MED ORDER — GLYCOPYRROLATE 0.2 MG/ML IJ SOLN
INTRAMUSCULAR | Status: DC | PRN
  Administered 2013-05-23: 0.6 mg via INTRAVENOUS

## 2013-05-23 MED ORDER — ACETAMINOPHEN 10 MG/ML IV SOLN
INTRAVENOUS | Status: AC
  Filled 2013-05-23: qty 100

## 2013-05-23 MED ORDER — ARTIFICIAL TEARS OP OINT
TOPICAL_OINTMENT | OPHTHALMIC | Status: DC | PRN
  Administered 2013-05-23: 1 via OPHTHALMIC

## 2013-05-23 MED ORDER — THROMBIN 20000 UNITS EX KIT
PACK | CUTANEOUS | Status: DC | PRN
  Administered 2013-05-23: 15:00:00 via TOPICAL

## 2013-05-23 MED ORDER — DIAZEPAM 5 MG/ML IJ SOLN
2.5000 mg | Freq: Once | INTRAMUSCULAR | Status: DC
  Administered 2013-05-23: 2.5 mg via INTRAVENOUS

## 2013-05-23 MED ORDER — ONDANSETRON HCL 4 MG/2ML IJ SOLN
INTRAMUSCULAR | Status: DC | PRN
  Administered 2013-05-23: 4 mg via INTRAMUSCULAR

## 2013-05-23 MED ORDER — KETOROLAC TROMETHAMINE 30 MG/ML IJ SOLN
30.0000 mg | Freq: Four times a day (QID) | INTRAMUSCULAR | Status: DC
  Administered 2013-05-23: 30 mg via INTRAVENOUS

## 2013-05-23 MED ORDER — 0.9 % SODIUM CHLORIDE (POUR BTL) OPTIME
TOPICAL | Status: DC | PRN
  Administered 2013-05-23: 1000 mL

## 2013-05-23 MED ORDER — HYDROMORPHONE HCL PF 1 MG/ML IJ SOLN
0.2500 mg | INTRAMUSCULAR | Status: DC | PRN
  Administered 2013-05-23 (×2): 0.5 mg via INTRAVENOUS

## 2013-05-23 MED ORDER — FENTANYL CITRATE 0.05 MG/ML IJ SOLN
INTRAMUSCULAR | Status: DC | PRN
  Administered 2013-05-23 (×2): 50 ug via INTRAVENOUS
  Administered 2013-05-23: 100 ug via INTRAVENOUS
  Administered 2013-05-23: 50 ug via INTRAVENOUS
  Administered 2013-05-23: 100 ug via INTRAVENOUS
  Administered 2013-05-23 (×2): 50 ug via INTRAVENOUS
  Administered 2013-05-23: 100 ug via INTRAVENOUS
  Administered 2013-05-23: 50 ug via INTRAVENOUS

## 2013-05-23 MED ORDER — THROMBIN 5000 UNITS EX SOLR
CUTANEOUS | Status: DC | PRN
  Administered 2013-05-23: 5000 [IU] via TOPICAL

## 2013-05-23 MED ORDER — LACTATED RINGERS IV SOLN
INTRAVENOUS | Status: DC | PRN
  Administered 2013-05-23 (×2): via INTRAVENOUS

## 2013-05-23 MED ORDER — HYDROMORPHONE HCL PF 1 MG/ML IJ SOLN
INTRAMUSCULAR | Status: AC
  Administered 2013-05-23: 0.5 mg via INTRAVENOUS
  Filled 2013-05-23: qty 1

## 2013-05-23 MED ORDER — ACETAMINOPHEN 10 MG/ML IV SOLN
INTRAVENOUS | Status: DC | PRN
  Administered 2013-05-23: 1000 mg via INTRAVENOUS

## 2013-05-23 MED ORDER — VECURONIUM BROMIDE 10 MG IV SOLR
INTRAVENOUS | Status: DC | PRN
  Administered 2013-05-23 (×2): 1 mg via INTRAVENOUS

## 2013-05-23 MED ORDER — CEFAZOLIN SODIUM-DEXTROSE 2-3 GM-% IV SOLR
2.0000 g | Freq: Once | INTRAVENOUS | Status: AC
  Administered 2013-05-23: 2 g via INTRAVENOUS

## 2013-05-23 MED ORDER — LIDOCAINE HCL (CARDIAC) 20 MG/ML IV SOLN
INTRAVENOUS | Status: DC | PRN
  Administered 2013-05-23: 60 mg via INTRAVENOUS
  Administered 2013-05-23: 40 mg via INTRAVENOUS

## 2013-05-23 MED ORDER — CEFAZOLIN SODIUM-DEXTROSE 2-3 GM-% IV SOLR
INTRAVENOUS | Status: AC
  Filled 2013-05-23: qty 50

## 2013-05-23 MED ORDER — NEOSTIGMINE METHYLSULFATE 1 MG/ML IJ SOLN
INTRAMUSCULAR | Status: DC | PRN
  Administered 2013-05-23: 5 mg via INTRAVENOUS

## 2013-05-23 MED ORDER — SODIUM CHLORIDE 0.9 % IR SOLN
Status: DC | PRN
  Administered 2013-05-23 (×2)

## 2013-05-23 MED ORDER — DIAZEPAM 5 MG/ML IJ SOLN
INTRAMUSCULAR | Status: AC
  Administered 2013-05-23: 2.5 mg via INTRAVENOUS
  Filled 2013-05-23: qty 2

## 2013-05-23 MED ORDER — THROMBIN 20000 UNITS EX SOLR
CUTANEOUS | Status: AC
  Filled 2013-05-23: qty 20000

## 2013-05-23 SURGICAL SUPPLY — 26 items
BAG DECANTER FOR FLEXI CONT (MISCELLANEOUS) ×4 IMPLANT
BUR MATCHSTICK NEURO 3.0X3.8 (BURR) ×2 IMPLANT
CATH ROBINSON RED A/P 12FR (CATHETERS) ×2 IMPLANT
DRAPE LAPAROTOMY T 102X78X121 (DRAPES) ×2 IMPLANT
DRAPE MICROSCOPE LEICA (MISCELLANEOUS) ×2 IMPLANT
DRSG OPSITE POSTOP 4X8 (GAUZE/BANDAGES/DRESSINGS) ×2 IMPLANT
GLOVE BIO SURGEON STRL SZ 6.5 (GLOVE) ×4 IMPLANT
GLOVE BIO SURGEON STRL SZ7 (GLOVE) ×2 IMPLANT
GLOVE BIOGEL PI IND STRL 8 (GLOVE) ×1 IMPLANT
GLOVE BIOGEL PI INDICATOR 8 (GLOVE) ×1
GLOVE ECLIPSE 6.5 STRL STRAW (GLOVE) ×2 IMPLANT
GLOVE ECLIPSE 7.5 STRL STRAW (GLOVE) ×2 IMPLANT
GOWN BRE IMP SLV AUR LG STRL (GOWN DISPOSABLE) ×6 IMPLANT
KIT BASIN OR (CUSTOM PROCEDURE TRAY) ×2 IMPLANT
PACK LAMINECTOMY NEURO (CUSTOM PROCEDURE TRAY) ×2 IMPLANT
SPONGE GAUZE 4X4 12PLY (GAUZE/BANDAGES/DRESSINGS) ×2 IMPLANT
STAPLER SKIN PROX WIDE 3.9 (STAPLE) ×2 IMPLANT
STAPLER VISISTAT (STAPLE) IMPLANT
SUT ETHILON 3 0 FSL (SUTURE) ×2 IMPLANT
SUT PROLENE 1 CT (SUTURE) ×2 IMPLANT
SUT VIC AB 1 CT1 18XBRD ANBCTR (SUTURE) ×2 IMPLANT
SUT VIC AB 1 CT1 8-18 (SUTURE) ×2
SUT VIC AB 2-0 CP2 18 (SUTURE) ×4 IMPLANT
SUT VIC AB 3-0 SH 18 (SUTURE) ×4 IMPLANT
TOWEL OR 17X24 6PK STRL BLUE (TOWEL DISPOSABLE) ×2 IMPLANT
TOWEL OR 17X26 10 PK STRL BLUE (TOWEL DISPOSABLE) ×2 IMPLANT

## 2013-05-23 NOTE — Preoperative (Signed)
Beta Blockers   Reason not to administer Beta Blockers:Not Applicable 

## 2013-05-23 NOTE — Anesthesia Procedure Notes (Addendum)
Procedure Name: Intubation Date/Time: 05/23/2013 1:31 PM Performed by: Gayla Medicus Pre-anesthesia Checklist: Patient identified, Timeout performed, Emergency Drugs available, Suction available and Patient being monitored Patient Re-evaluated:Patient Re-evaluated prior to inductionOxygen Delivery Method: Circle system utilized Preoxygenation: Pre-oxygenation with 100% oxygen Intubation Type: IV induction Ventilation: Mask ventilation without difficulty and Oral airway inserted - appropriate to patient size Laryngoscope Size: Mac and 3 Grade View: Grade I Tube type: Oral Tube size: 7.5 mm Number of attempts: 1 Airway Equipment and Method: Stylet and Video-laryngoscopy Placement Confirmation: ETT inserted through vocal cords under direct vision,  positive ETCO2 and breath sounds checked- equal and bilateral Secured at: 22 cm Tube secured with: Tape Dental Injury: Teeth and Oropharynx as per pre-operative assessment  Comments: Atraumatic intubation with glidescope; no damage to dental work. KH

## 2013-05-23 NOTE — Progress Notes (Signed)
Patient came back from PACU very stable, vitals checked and recorded.Will keep monitoring.

## 2013-05-23 NOTE — Progress Notes (Signed)
No issues overnight. Pt continues to c/o left leg pain, unchanged since surgery. Has not eaten in preparation for possible revision surgery today.  EXAM:  BP 184/67  Pulse 65  Temp(Src) 97.8 F (36.6 C) (Oral)  Resp 20  Ht 6' (1.829 m)  Wt 90.855 kg (200 lb 4.8 oz)  BMI 27.16 kg/m2  SpO2 97%  Awake, alert, oriented  Speech fluent, appropriate  CN grossly intact  5/5 BUE/BLE   IMPRESSION:  79 y.o. male s/p L5-S1 fusion, with posterior displacement of interbody cage, and clinical left S1 radiculopathy  PLAN: - Possible hardware revision today

## 2013-05-23 NOTE — Anesthesia Postprocedure Evaluation (Signed)
  Anesthesia Post-op Note  Patient: [name]  Procedure(s) Performed: Procedure(s): exploration of lumbar wound, explantation of left interbody implant. (N/A)  Patient Location: PACU  Anesthesia Type:General  Level of Consciousness: awake and alert   Airway and Oxygen Therapy: Patient Spontanous Breathing  Post-op Pain: moderate  Post-op Assessment: Post-op Vital signs reviewed, Patient's Cardiovascular Status Stable, Respiratory Function Stable, Patent Airway and No signs of Nausea or vomiting  Post-op Vital Signs: Reviewed and stable  Complications: No apparent anesthesia complications

## 2013-05-23 NOTE — Op Note (Signed)
05/20/2013 - 05/23/2013  3:45 PM  PATIENT:  [name]  79 y.o. male  PRE-OPERATIVE DIAGNOSIS:  left lumbar radiculopathy  POST-OPERATIVE DIAGNOSIS:  left lumbar radiculopathy  PROCEDURE:  Procedure(s): exploration of lumbar wound, explantation of left interbody implant.  SURGEON:  Surgeon(s): Hewitt Shorts, MD Carmela Hurt, MD  ASSISTANTS:  Coletta Memos, MD  ANESTHESIA:   general  EBL:  Total I/O In: 1000 [I.V.:1000] Out: 590 [Urine:440; Blood:150]  BLOOD ADMINISTERED:none  COUNT: Correct per nursing staff  DICTATION: Patient was brought to the operating room, placed under general endotracheal anesthesia. Patient was turned to a prone position. The lumbar region was prepped with Betadine soap and solution, and draped in a sterile fashion. The previous midline incision was reopened, and we dissected down to the lumbar fascia. There was evidence of old hematoma and grumous material in the subcutaneous space, but no evidence of purulence. The Vicryl sutures to closing the lumbar fascia cut, and the fascia reopened. In the subfascial space and laminectomy space we again found hematoma, including brownish serous fluid, as well as additional grumous material, but again no evidence of purulence. A self-retaining retractor was placed, and we began to explore down to the left L5-S1 neural foramen. The operating microscope was draped, and brought into the field to provide additional magnification, visualization, and illumination. We identified the exiting left L5 nerve root and the lateral margin of the thecal sac. We then identified the left lateral aspect of the L5-S1 disc space, and the left L5-S1 interbody implant. The implant was loose within the interbody space and impinging on the exiting left L5 nerve root. To be able to remove it we had to use the high-speed drill to remove the posterolateral superior endplate of S1. We were then able to safely insert the inserter/remover and  secure it to the interbody implant. We then gently removed the interbody implant, thereby decompressing the left L5 nerve root. We then irrigated the wound extensively with 1 L of bacitracin solution and then we proceeded with closure. Paraspinal muscles were approximate interrupted undyed 1 Vicryl suture. Deep fascia was closed with interrupted undyed 1 Vicryl sutures. We will place 1 or 2 sutures in the Scarpa's fascia, but the tissues above the fascia was somewhat friable and therefore we also placed 2 1 Prolene retention sutures with red Roxan Hockey rubber-dams. The subcutaneous and subcuticular were closed with interrupted inverted 2-0 Vicryl sutures as feasible. Overlying that staples were placed. Will be tissue was too friable to place subcutaneous/subcuticular sutures, single layer closure with 3-0 nylon placed in a interrupted horizontal mattress fashion was used to approximate the skin edges. We then applied a honeycomb dressing over the incision/closure. Following surgery the patient was turned back to a supine position to be reversed from the anesthetic, extubated, and transferred to the recovery room for further care.  PLAN OF CARE: Admit to inpatient   PATIENT DISPOSITION:  PACU - hemodynamically stable.   Delay start of Pharmacological VTE agent (>24hrs) due to surgical blood loss or risk of bleeding:  yes

## 2013-05-23 NOTE — Anesthesia Preprocedure Evaluation (Addendum)
Anesthesia Evaluation  Patient identified by MRN, date of birth, ID band Patient awake    Reviewed: Allergy & Precautions, H&P , NPO status , Patient's Chart, lab work & pertinent test results  Airway Mallampati: I TM Distance: >3 FB Neck ROM: Full    Dental no notable dental hx. (+) Teeth Intact and Dental Advisory Given   Pulmonary neg pulmonary ROS,  breath sounds clear to auscultation  Pulmonary exam normal       Cardiovascular hypertension, Pt. on medications + CAD Rhythm:Regular Rate:Normal     Neuro/Psych  Headaches, PSYCHIATRIC DISORDERS    GI/Hepatic negative GI ROS, Neg liver ROS,   Endo/Other  negative endocrine ROS  Renal/GU negative Renal ROS  negative genitourinary   Musculoskeletal   Abdominal   Peds  Hematology negative hematology ROS (+)   Anesthesia Other Findings   Reproductive/Obstetrics negative OB ROS                         Anesthesia Physical Anesthesia Plan  ASA: II  Anesthesia Plan: General   Post-op Pain Management:    Induction: Intravenous  Airway Management Planned: Oral ETT and Video Laryngoscope Planned  Additional Equipment:   Intra-op Plan:   Post-operative Plan: Extubation in OR  Informed Consent: I have reviewed the patients History and Physical, chart, labs and discussed the procedure including the risks, benefits and alternatives for the proposed anesthesia with the patient or authorized representative who has indicated his/her understanding and acceptance.   Dental advisory given  Plan Discussed with: CRNA  Anesthesia Plan Comments:        Anesthesia Quick Evaluation

## 2013-05-23 NOTE — Progress Notes (Signed)
Subjective: Patient continues to have significant radicular pain thru the left buttock, posterior thigh, and calf; is eased by laying, significantly worse when standing. Some discomfort recently in the right posterior thigh.  MRI last evening interpreted by radiologist as suspicious for wound infection.  Blood cultures from October 8 continue to show no growth. Patient remains afebrile.  Objective: Vital signs in last 24 hours: Filed Vitals:   05/22/13 1822 05/22/13 2109 05/23/13 0618 05/23/13 1001  BP: 157/77 149/65 184/67 173/96  Pulse: 80 69 65 69  Temp: 98 F (36.7 C) 98.2 F (36.8 C) 97.8 F (36.6 C) 98 F (36.7 C)  TempSrc: Oral Oral Oral Oral  Resp: 18 18 20 20   Height:      Weight:      SpO2: 96% 98% 97% 97%    Intake/Output from previous day: 10/10 0701 - 10/11 0700 In: 3 [I.V.:3] Out: -  Intake/Output this shift:    Physical Exam:  Wound shows mild swelling, and no erythema, but no drainage.  CBC  Recent Labs  05/20/13 1703  WBC 8.4  HGB 12.6*  HCT 37.0*  PLT 559*   BMET  Recent Labs  05/20/13 1703  NA 141  K 3.7  CL 106  CO2 22  GLUCOSE 95  BUN 11  CREATININE 0.93  CALCIUM 8.9    Studies/Results: Mr Lumbar Spine W Wo Contrast  05/23/2013   CLINICAL DATA:  Patient is status post L5-S1 fusion 05/11/2013 with continued disabling left lumbar radicular pain.  EXAM: MRI LUMBAR SPINE WITHOUT AND WITH CONTRAST  TECHNIQUE: Multiplanar and multiecho pulse sequences of the lumbar spine were obtained without and with intravenous contrast.  CONTRAST:  20mL MULTIHANCE GADOBENATE DIMEGLUMINE 529 MG/ML IV SOLN  COMPARISON:  CT scan lumbar spine 05/21/2013. Plain films lumbar spine 05/18/2013. MRI lumbar spine 03/25/2013. Negative.  FINDINGS: The patient is status post L5-S1 laminectomy and fusion with pedicle screws and stabilization bars in place. As seen on CT scan, there is 1.1 cm anterolisthesis of L5 on S1. The interbody spacer on the left is laterally  displaced out of the disc interspacing and likely impinges on the left L5 root.  There is a fluid collection in the surgical bed which extends anteriorly and surrounds the thecal sac. The collection measures up to 5.1 cm AP x 3.6 cm transverse by 4.8 cm craniocaudal and results in compression of the thecal sac. The collection extends to just below the skin surface posteriorly and shows rim enhancement. Also seen is intense enhancement about the thecal sac and the descending S1 roots. There appear to be small bubbles of gas within the collection which are not seen on the prior CT scan. There appears to be loss of cortical bone along the inferior endplate of L5 which is new since the prior CT scan.  There is no fracture. Scattered hemangiomas are noted. The conus medullaris is normal in signal and position. There is mild convex left scoliosis. Imaged intra-abdominal contents are unremarkable.  The T11-12 and T12-L1 levels are imaged in the sagittal plane only and negative.  L1-2: Extra foraminal spur on the right could impact the exited right L1 root. The central canal and foramina are open. The appearance is unchanged.  L2-3: There is some facet degenerative disease and a shallow disc bulge but the central canal and foramina appear open.  L3-4: Right worse than left facet arthropathy is present. Osteophytosis off the right facet joint results in narrowing in the right lateral recess and foramen.  The central canal and left foramen are open. Right laminotomy defect noted.  L4-5: There is a mild disc bulge is facet arthropathy. Central canal and foramina are open.  L5-S1: As described above.  IMPRESSION: Findings highly suspicious for abscess formation at the site of L5-S1 fusion with resultant mass effect on the thecal sac which is markedly narrowed. Changes worrisome for osteomyelitis are best seen in the inferior endplate of L5. As noted above, interbody spacer on the left is laterally displaced out of the disc  interspaces and likely impacts the left L5 root.  Critical Value/emergent results were called by telephone at the time of interpretation on 05/23/2013 at 9:02 AM to Dr.Dr. Coletta Memos, who verbally acknowledged these results.   Electronically Signed   By: Drusilla Kanner M.D.   On: 05/23/2013 09:06    Assessment/Plan: Results of MRI discussed with the patient. He is anxious to proceed with surgery. I've explained that our plan would be exploration of his wound and possible revision of lumbar arthrodesis. We discussed risks of surgery including risks of infection, bleeding, possibly for transfusion, the risk of neurologic dysfunction, risk of causing further surgery, and anesthetic risks of myocardial infarction, stroke, pneumonia, and death. Understanding all this the patient does want to proceed with surgery and will plan on proceeding later today.   Hewitt Shorts, MD 05/23/2013, 11:54 AM

## 2013-05-23 NOTE — Transfer of Care (Signed)
Immediate Anesthesia Transfer of Care Note  Patient: [name]  Procedure(s) Performed: Procedure(s): exploration of lumbar wound, explantation of left interbody implant. (N/A)  Patient Location: PACU  Anesthesia Type:General  Level of Consciousness: oriented, sedated, patient cooperative and responds to stimulation  Airway & Oxygen Therapy: Patient Spontanous Breathing and Patient connected to nasal cannula oxygen  Post-op Assessment: Report given to PACU RN, Post -op Vital signs reviewed and stable, Patient moving all extremities and Patient moving all extremities X 4  Post vital signs: Reviewed and stable  Complications: No apparent anesthesia complications

## 2013-05-24 MED ORDER — CLONIDINE HCL 0.1 MG PO TABS
0.1000 mg | ORAL_TABLET | ORAL | Status: AC
  Administered 2013-05-24: 0.1 mg via ORAL
  Filled 2013-05-24: qty 1

## 2013-05-24 NOTE — Progress Notes (Signed)
Pt. BP remains elevated after both scheduled BP meds and pain meds. SBP currently 188. Dr. Franky Macho notified; new orders received.

## 2013-05-24 NOTE — Progress Notes (Signed)
Patient ID: [name], male   DOB: 03-14-1932, 79 y.o.   MRN: [id number] BP 188/87  Pulse 84  Temp(Src) 98.9 F (37.2 C) (Oral)  Resp 18  Ht 6' (1.829 m)  Wt 90.855 kg (200 lb 4.8 oz)  BMI 27.16 kg/m2  SpO2 100% Alert and oriented x 4, speech is clear and fluent Moving lower extremities well Dressing is blood stained, dry at this time Improved pain in the left lower extremity Continue pt

## 2013-05-25 NOTE — Progress Notes (Signed)
Pt. Showered with incision OTA. Pt. Tolerated well. Will monitor.

## 2013-05-25 NOTE — Progress Notes (Signed)
Per MD patient need a shower but Pt refused it saying,' I washed up this morning'. Informed him to call anytime he is ready.

## 2013-05-25 NOTE — Progress Notes (Signed)
Filed Vitals:   05/24/13 2223 05/25/13 0243 05/25/13 0612 05/25/13 0917  BP: 154/65 159/67 196/80 175/72  Pulse: 65 62 65 75  Temp: 97.9 F (36.6 C) 98.3 F (36.8 C) 97.2 F (36.2 C) 98 F (36.7 C)  TempSrc: Oral Oral Oral Oral  Resp:  16  18  Height:      Weight:      SpO2: 95% 98% 99% 99%     Patient up and ambulating in the room. Still some mild left radicular discomfort, but much improved as compared to prior surgery. Dressing dry and intact. Continuing on Toradol IV as well as Percocet. We'll have staff removed bandage, and have patient shower.   Plan: Encouraged patient to ambulate in the halls.  Hewitt Shorts, MD 05/25/2013, 12:59 PM

## 2013-05-26 LAB — CULTURE, BLOOD (ROUTINE X 2)
Culture: NO GROWTH
Culture: NO GROWTH

## 2013-05-26 MED ORDER — OXYCODONE-ACETAMINOPHEN 5-325 MG PO TABS: 1.0000 | ORAL_TABLET | ORAL | Status: DC | PRN

## 2013-05-26 NOTE — Progress Notes (Signed)
Discharge order received. Discharge instructions and prescriptions given to patient. Instructed to call office for follow up. Discharged home with spouse.

## 2013-05-26 NOTE — Discharge Summary (Signed)
Physician Discharge Summary  Patient ID: [name] MRN: [id number] DOB/AGE: 19-Jun-1932 79 y.o.  Admit date: 05/20/2013 Discharge date: 05/26/2013  Admission Diagnoses:  Left lumbar radiculopathy, nerve impingement from migrated interbody implant  Discharge Diagnoses:  Left lumbar radiculopathy, nerve impingement from migrated interbody implant  Discharged Condition: good  Hospital Course: Patient was admitted and we proceeded with further workup including CT scan, and then subsequently MRI scan. These showed that the interbody implant on the right side has migrated somewhat posteriorly, but the interbody implant on the left side had migrated towards the left, and we were concerned that it was causing nerve impingement at his disabling left lumbar radicular pain. Radiologist raises the possibility of wound infection on the basis of the MRI findings. Patient was taken to surgery for exploration of the wound. No evidence of wound infection was found, rather we found evidence of old hematoma within the dissection plane. Further we did find that the left interbody implant was loose and impinging on the left L5 nerve root. That left interbody implant was removed. Following surgery the patient had marked improvement in the disabling left lumbar radicular pain. He's got up and ambulate with some mild residual discomfort in the left calf. His wound is healing nicely, is clean and dry, without erythema, swelling, or drainage. We are discharging the patient to home, he is to return for followup in the office next week for removal of some of the sutures and staples for the incision, and further followup plans we decided at the time of that visit. He's been given instructions regarding wound care activities.  Discharge Exam: Blood pressure 171/64, pulse 67, temperature 98.1 F (36.7 C), temperature source Oral, resp. rate 18, height 6' (1.829 m), weight 90.855 kg (200 lb 4.8 oz), SpO2 95.00%.  Disposition:  Home   Future Appointments Provider Department Dept Phone   07/08/2013 11:45 AM Stacie Glaze, MD [facility] [phone]       Medication List    STOP taking these medications       OxyCODONE 10 mg T12a 12 hr tablet  Commonly known as:  OXYCONTIN      TAKE these medications       cephALEXin 500 MG capsule  Commonly known as:  KEFLEX  Take 500 mg by mouth 3 (three) times daily.     diltiazem 300 MG 24 hr capsule  Commonly known as:  CARDIZEM CD  Take 300 mg by mouth daily.     naproxen sodium 220 MG tablet  Commonly known as:  ANAPROX  Take 440 mg by mouth 2 (two) times daily with a meal.     oxyCODONE-acetaminophen 5-325 MG per tablet  Commonly known as:  PERCOCET/ROXICET  Take 1-2 tablets by mouth every 4 (four) hours as needed for pain.     rosuvastatin 20 MG tablet  Commonly known as:  CRESTOR  Take 20 mg by mouth once a week. On Friday     telmisartan 80 MG tablet  Commonly known as:  MICARDIS  Take 80 mg by mouth daily.         Signed: Hewitt Shorts, MD 05/26/2013, 8:12 AM

## 2013-05-27 ENCOUNTER — Encounter (HOSPITAL_COMMUNITY): Payer: Self-pay | Admitting: Neurosurgery

## 2013-06-01 ENCOUNTER — Inpatient Hospital Stay (HOSPITAL_COMMUNITY): Payer: Medicare Other

## 2013-06-01 ENCOUNTER — Inpatient Hospital Stay (HOSPITAL_COMMUNITY)
Admission: EM | Admit: 2013-06-01 | Discharge: 2013-06-10 | DRG: 552 | Disposition: A | Discharge status: Home / Self Care | Payer: Medicare Other | Attending: Neurosurgery | Admitting: Neurosurgery

## 2013-06-01 ENCOUNTER — Encounter (HOSPITAL_COMMUNITY): Payer: Self-pay | Admitting: Emergency Medicine

## 2013-06-01 DIAGNOSIS — D649 Anemia, unspecified: Secondary | ICD-10-CM | POA: Diagnosis present

## 2013-06-01 DIAGNOSIS — M47817 Spondylosis without myelopathy or radiculopathy, lumbosacral region: Secondary | ICD-10-CM | POA: Diagnosis not present

## 2013-06-01 DIAGNOSIS — IMO0002 Reserved for concepts with insufficient information to code with codable children: Secondary | ICD-10-CM | POA: Diagnosis not present

## 2013-06-01 DIAGNOSIS — M545 Low back pain, unspecified: Principal | ICD-10-CM | POA: Diagnosis present

## 2013-06-01 DIAGNOSIS — Z87891 Personal history of nicotine dependence: Secondary | ICD-10-CM | POA: Diagnosis not present

## 2013-06-01 DIAGNOSIS — M48061 Spinal stenosis, lumbar region without neurogenic claudication: Secondary | ICD-10-CM | POA: Diagnosis not present

## 2013-06-01 DIAGNOSIS — Z981 Arthrodesis status: Secondary | ICD-10-CM | POA: Diagnosis not present

## 2013-06-01 DIAGNOSIS — M169 Osteoarthritis of hip, unspecified: Secondary | ICD-10-CM | POA: Diagnosis not present

## 2013-06-01 DIAGNOSIS — M549 Dorsalgia, unspecified: Secondary | ICD-10-CM | POA: Diagnosis not present

## 2013-06-01 DIAGNOSIS — E785 Hyperlipidemia, unspecified: Secondary | ICD-10-CM | POA: Diagnosis not present

## 2013-06-01 DIAGNOSIS — I10 Essential (primary) hypertension: Secondary | ICD-10-CM | POA: Diagnosis present

## 2013-06-01 DIAGNOSIS — M79609 Pain in unspecified limb: Secondary | ICD-10-CM | POA: Diagnosis present

## 2013-06-01 DIAGNOSIS — M5137 Other intervertebral disc degeneration, lumbosacral region: Secondary | ICD-10-CM | POA: Diagnosis not present

## 2013-06-01 DIAGNOSIS — G8929 Other chronic pain: Secondary | ICD-10-CM | POA: Diagnosis not present

## 2013-06-01 DIAGNOSIS — G8928 Other chronic postprocedural pain: Secondary | ICD-10-CM | POA: Diagnosis not present

## 2013-06-01 DIAGNOSIS — Z85820 Personal history of malignant melanoma of skin: Secondary | ICD-10-CM | POA: Diagnosis not present

## 2013-06-01 DIAGNOSIS — Z96659 Presence of unspecified artificial knee joint: Secondary | ICD-10-CM | POA: Diagnosis not present

## 2013-06-01 LAB — BASIC METABOLIC PANEL
BUN: 15 mg/dL (ref 6–23)
CO2: 23 mEq/L (ref 19–32)
Calcium: 9.2 mg/dL (ref 8.4–10.5)
Chloride: 106 mEq/L (ref 96–112)
Creatinine, Ser: 1.03 mg/dL (ref 0.50–1.35)
GFR calc Af Amer: 78 mL/min — ABNORMAL LOW (ref >90)
GFR calc non Af Amer: 67 mL/min — ABNORMAL LOW (ref >90)
Glucose, Bld: 98 mg/dL (ref 70–99)
Potassium: 3.9 mEq/L (ref 3.5–5.1)
Sodium: 139 mEq/L (ref 135–145)

## 2013-06-01 LAB — CBC WITH DIFFERENTIAL/PLATELET
Basophils Absolute: 0 10*3/uL (ref 0.0–0.1)
Basophils Relative: 1 % (ref 0–1)
Eosinophils Absolute: 0.1 10*3/uL (ref 0.0–0.7)
Eosinophils Relative: 2 % (ref 0–5)
HCT: 32.8 % — ABNORMAL LOW (ref 39.0–52.0)
Hemoglobin: 10.9 g/dL — ABNORMAL LOW (ref 13.0–17.0)
Lymphocytes Relative: 30 % (ref 12–46)
Lymphs Abs: 2.7 10*3/uL (ref 0.7–4.0)
MCH: 28.8 pg (ref 26.0–34.0)
MCHC: 33.2 g/dL (ref 30.0–36.0)
MCV: 86.5 fL (ref 78.0–100.0)
Monocytes Absolute: 0.7 10*3/uL (ref 0.1–1.0)
Monocytes Relative: 8 % (ref 3–12)
Neutro Abs: 5.3 10*3/uL (ref 1.7–7.7)
Neutrophils Relative %: 60 % (ref 43–77)
Platelets: 633 10*3/uL — ABNORMAL HIGH (ref 150–400)
RBC: 3.79 MIL/uL — ABNORMAL LOW (ref 4.22–5.81)
RDW: 13.6 % (ref 11.5–15.5)
WBC: 8.8 10*3/uL (ref 4.0–10.5)

## 2013-06-01 MED ORDER — BISACODYL 10 MG RE SUPP
10.0000 mg | Freq: Every day | RECTAL | Status: DC | PRN
  Administered 2013-06-05 – 2013-06-09 (×3): 10 mg via RECTAL
  Filled 2013-06-01 (×3): qty 1

## 2013-06-01 MED ORDER — ACETAMINOPHEN 650 MG RE SUPP: 650.0000 mg | RECTAL | Status: DC | PRN

## 2013-06-01 MED ORDER — MORPHINE SULFATE 4 MG/ML IJ SOLN
4.0000 mg | INTRAMUSCULAR | Status: DC | PRN
  Administered 2013-06-01 – 2013-06-02 (×4): 4 mg via INTRAMUSCULAR
  Administered 2013-06-03 (×2): 8 mg via INTRAMUSCULAR
  Administered 2013-06-03 – 2013-06-08 (×11): 4 mg via INTRAMUSCULAR
  Filled 2013-06-01 (×3): qty 1
  Filled 2013-06-01: qty 2
  Filled 2013-06-01 (×6): qty 1
  Filled 2013-06-01 (×2): qty 2
  Filled 2013-06-01 (×6): qty 1

## 2013-06-01 MED ORDER — HYDROCODONE-ACETAMINOPHEN 5-325 MG PO TABS
1.0000 | ORAL_TABLET | ORAL | Status: DC | PRN
  Administered 2013-06-01 – 2013-06-06 (×3): 2 via ORAL
  Filled 2013-06-01 (×5): qty 2

## 2013-06-01 MED ORDER — PREGABALIN 75 MG PO CAPS
75.0000 mg | ORAL_CAPSULE | Freq: Two times a day (BID) | ORAL | Status: DC
  Administered 2013-06-01 – 2013-06-04 (×6): 75 mg via ORAL
  Filled 2013-06-01 (×6): qty 1

## 2013-06-01 MED ORDER — IRBESARTAN 300 MG PO TABS
300.0000 mg | ORAL_TABLET | Freq: Every day | ORAL | Status: DC
  Administered 2013-06-02 – 2013-06-09 (×8): 300 mg via ORAL
  Filled 2013-06-01 (×10): qty 1

## 2013-06-01 MED ORDER — OXYCODONE-ACETAMINOPHEN 5-325 MG PO TABS
1.0000 | ORAL_TABLET | ORAL | Status: DC | PRN
  Administered 2013-06-02 – 2013-06-10 (×18): 2 via ORAL
  Filled 2013-06-01 (×18): qty 2

## 2013-06-01 MED ORDER — MAGNESIUM HYDROXIDE 400 MG/5ML PO SUSP
30.0000 mL | Freq: Every day | ORAL | Status: DC | PRN
  Administered 2013-06-02 – 2013-06-10 (×6): 30 mL via ORAL
  Filled 2013-06-01 (×6): qty 30

## 2013-06-01 MED ORDER — MENTHOL 3 MG MT LOZG: 1.0000 | LOZENGE | OROMUCOSAL | Status: DC | PRN

## 2013-06-01 MED ORDER — HYDROXYZINE HCL 25 MG PO TABS: 50.0000 mg | ORAL_TABLET | ORAL | Status: DC | PRN

## 2013-06-01 MED ORDER — CYCLOBENZAPRINE HCL 10 MG PO TABS
10.0000 mg | ORAL_TABLET | Freq: Three times a day (TID) | ORAL | Status: DC | PRN
  Administered 2013-06-01 – 2013-06-09 (×15): 10 mg via ORAL
  Filled 2013-06-01 (×15): qty 1

## 2013-06-01 MED ORDER — ACETAMINOPHEN 325 MG PO TABS: 650.0000 mg | ORAL_TABLET | ORAL | Status: DC | PRN

## 2013-06-01 MED ORDER — ATORVASTATIN CALCIUM 40 MG PO TABS
40.0000 mg | ORAL_TABLET | ORAL | Status: DC
  Administered 2013-06-05: 40 mg via ORAL
  Filled 2013-06-01: qty 1

## 2013-06-01 MED ORDER — ZOLPIDEM TARTRATE 5 MG PO TABS: 5.0000 mg | ORAL_TABLET | Freq: Every evening | ORAL | Status: DC | PRN

## 2013-06-01 MED ORDER — DILTIAZEM HCL ER COATED BEADS 300 MG PO CP24
300.0000 mg | ORAL_CAPSULE | Freq: Every day | ORAL | Status: DC
  Administered 2013-06-02 – 2013-06-09 (×8): 300 mg via ORAL
  Filled 2013-06-01 (×10): qty 1

## 2013-06-01 MED ORDER — ALUM & MAG HYDROXIDE-SIMETH 200-200-20 MG/5ML PO SUSP: 30.0000 mL | Freq: Four times a day (QID) | ORAL | Status: DC | PRN

## 2013-06-01 MED ORDER — PHENOL 1.4 % MT LIQD: 1.0000 | OROMUCOSAL | Status: DC | PRN

## 2013-06-01 NOTE — ED Notes (Signed)
Pt reports he has chronic lower back pain and has had several surgeries performed, pt was supposed to f/u with Dr. Bettina Gavia today for pain control but the dr. was unable to keep the appt due to an emergent surgery today, his office instructed pt to come to the ED and to call him upon his arrival. Pt reports his pain starting in February after his second surgery and has not been able to get any relief, pt reports his pain is radiating down into his Left leg.

## 2013-06-01 NOTE — Progress Notes (Signed)
He is refusing SCDs

## 2013-06-01 NOTE — ED Provider Notes (Signed)
CSN: [id number]     Arrival date & time 06/01/13  1533 History   First MD Initiated Contact with Patient 06/01/13 1712     Chief Complaint  Patient presents with  . Back Pain   (Consider location/radiation/quality/duration/timing/severity/associated sxs/prior Treatment) Patient is a 79 y.o. male presenting with leg pain. The history is provided by the patient and medical records.  Leg Pain Location:  Leg Injury: no   Leg location:  L leg, L upper leg and L lower leg Pain details:    Quality:  Shooting   Severity:  Severe   Onset quality:  Gradual   Timing:  Intermittent   Progression:  Worsening Chronicity:  Chronic Dislocation: no   Foreign body present:  No foreign bodies Tetanus status:  Up to date Associated symptoms: back pain   Associated symptoms: no neck pain     Past Medical History  Diagnosis Date  . ACTINIC KERATOSIS, HEAD 03/23/2008  . ALLERGIC RHINITIS 03/27/2007  . BENIGN PROSTATIC HYPERTROPHY, WITH URINARY OBSTRUCTION 10/16/2007  . CARPAL TUNNEL SYNDROME, LEFT 04/13/2008  . ERECTILE DYSFUNCTION, ORGANIC 02/20/2010  . Gout, unspecified 02/20/2010  . HYPERLIPIDEMIA, WITH HIGH HDL 10/18/2008  . KNEE PAIN 10/10/2009  . LOW BACK PAIN 03/27/2007  . NEOPLASM, MALIGNANT, SKIN, TRUNK 05/09/2007  . Patellar tendinitis 10/10/2009  . SKIN CANCER, HX OF 03/27/2007  . UNS ADVRS EFF UNS RX MEDICINAL&BIOLOGICAL SBSTNC 08/18/2007  . HYPERTENSION 05/09/2007    sees Dr. Darryll Capers  . Headache(784.0)     migraines hx of  . Chronic neck pain   . Chronic back pain   . CAD 08/18/2007    "patient denies any issues with heart, does not see cardiologist"  . Urinary urgency   . History of melanoma   . Arthritis    Past Surgical History  Procedure Laterality Date  . Knee arthroscopy    . Cholecystectomy    . Nasal sinus surgery    . Vasectomy    . Lumbar fusions    . Joint replacement surgery    . Rectal fissure    . Lateral fusion lumbar spine, transverse    . Laminectomy    .  Lumbar fusion    . Rectal fissure    . Vitrectomy      right and left 2013  . Carpal tunnel release    . Eye surgery      bilateral cataracts removed and vitrectomies  . Blepharoplasty    . Back surgery    . Posterior cervical fusion/foraminotomy Right 10/01/2012    Procedure: POSTERIOR CERVICAL FUSION/FORAMINOTOMY LEVEL 1;  Surgeon: Hewitt Shorts, MD;  Location: [facility];  Service: Neurosurgery;  Laterality: Right;  Right Cervical seven thoracic one Cervical laminectomy/foraminotomy with posterior cervical arthrodesis   . Joint replacement Right     knee  . Appendectomy  1969  . Lumbar laminectomy/decompression microdiscectomy Left 11/03/2012    Procedure: LUMBAR LAMINECTOMY/DECOMPRESSION MICRODISCECTOMY 1 LEVEL;  Surgeon: Hewitt Shorts, MD;  Location: [facility];  Service: Neurosurgery;  Laterality: Left;  LEFT L5S1 laminotomy foraminotomy and possible microdiskectomy  . Melanoma excision    . Posterior lumbar fusion N/A 05/23/2013    Procedure: exploration of lumbar wound, explantation of left interbody implant.;  Surgeon: Hewitt Shorts, MD;  Location: [facility] OR;  Service: Neurosurgery;  Laterality: N/A;   Family History  Problem Relation Age of Onset  . Dementia Mother   . Anemia Father   . Heart disease Father  History  Substance Use Topics  . Smoking status: Former Smoker -- 1.00 packs/day for 33 years    Quit date: 09/29/1981  . Smokeless tobacco: Not on file  . Alcohol Use: 1.5 oz/week    3 drink(s) per week     Comment: daily    Review of Systems  Constitutional: Negative for activity change and appetite change.  Respiratory: Negative for cough, chest tightness and shortness of breath.   Cardiovascular: Negative for chest pain and leg swelling.  Gastrointestinal: Negative for nausea, vomiting, diarrhea and constipation.  Genitourinary: Negative for dysuria and difficulty urinating.  Musculoskeletal: Positive for arthralgias, back pain and gait problem.  Negative for joint swelling and neck pain.  Skin: Positive for wound (post-surgical). Negative for color change, pallor and rash.  Neurological: Negative for dizziness, weakness, light-headedness, numbness and headaches.  All other systems reviewed and are negative.    Allergies  Gabapentin  Home Medications   No current outpatient prescriptions on file. BP 177/82  Pulse 77  Temp(Src) 98.3 F (36.8 C) (Oral)  Resp 16  Ht 6' (1.829 m)  Wt 180 lb (81.647 kg)  BMI 24.41 kg/m2  SpO2 98% Physical Exam  Nursing note and vitals reviewed. Constitutional: He is oriented to person, place, and time. He appears well-developed and well-nourished. No distress.  Pleasant elderly male in no apparent distress  HENT:  Head: Normocephalic and atraumatic.  Mouth/Throat: Oropharynx is clear and moist. No oropharyngeal exudate.  Eyes: Conjunctivae and EOM are normal. Pupils are equal, round, and reactive to light.  Neck: Normal range of motion. Neck supple.  Cardiovascular: Normal rate, regular rhythm, normal heart sounds and intact distal pulses.  Exam reveals no gallop and no friction rub.   No murmur heard. Pulmonary/Chest: Effort normal and breath sounds normal. No respiratory distress. He has no wheezes. He has no rales.  Abdominal: Soft. He exhibits no distension and no mass. There is no tenderness. There is no rebound and no guarding.  Genitourinary:  No perianal numbness  Musculoskeletal: Normal range of motion. He exhibits no edema and no tenderness.  Incision to lumbar back with sutures in place. Mildly erythematous but no warmth, drainage, tenderness. No midline tenderness. Positive straight leg test on the left.  Lymphadenopathy:    He has no cervical adenopathy.  Neurological: He is alert and oriented to person, place, and time. He has normal strength. No cranial nerve deficit. Abnormal gait: gait deferred secondary to back pain. GCS eye subscore is 4. GCS verbal subscore is 5. GCS  motor subscore is 6.  5 out of 5 strength in bilateral lower extremities appear  Skin: Skin is warm and dry. No rash noted. He is not diaphoretic.  Psychiatric: He has a normal mood and affect. His behavior is normal. Judgment and thought content normal.    ED Course  Procedures (including critical care time) Labs Review Labs Reviewed  CBC WITH DIFFERENTIAL - Abnormal; Notable for the following:    RBC 3.79 (*)    Hemoglobin 10.9 (*)    HCT 32.8 (*)    Platelets 633 (*)    All other components within normal limits  BASIC METABOLIC PANEL - Abnormal; Notable for the following:    GFR calc non Af Amer 67 (*)    GFR calc Af Amer 78 (*)    All other components within normal limits  CULTURE, BLOOD (ROUTINE X 2)  CULTURE, BLOOD (ROUTINE X 2)   Imaging Review Dg Lumbar Spine 2-3 Views  06/01/2013  CLINICAL DATA:  Back pain.  EXAM: LUMBAR SPINE - 2-3 VIEW  COMPARISON:  MRI from 05/22/2013. CT scan from 05/21/2013. Plain films from 05/18/2013.  FINDINGS: Patient is status post PLIF at L5-S1. Stable since 05/18/2013. Diffuse degenerative changes are seen in the endplates. No new fracture is evident. As the minimal spondylolisthesis at L5-S1 appears unchanged since the prior plain film exam.  IMPRESSION: Stable exam with postoperative changes at L5-S1.   Electronically Signed   By: Kennith Center M.D.   On: 06/01/2013 19:27    EKG Interpretation     Ventricular Rate:    PR Interval:    QRS Duration:   QT Interval:    QTC Calculation:   R Axis:     Text Interpretation:              MDM   1. Back pain   2. Chronic radicular low back pain     79 year old male with a long-standing history of left-sided lumbar radiculopathy and sciatica pain, status post surgical decompression multiple times, most recently of which was 10 days ago by Dr. Newell Coral who presents today for worsening of his radicular pain. Patient was to follow up with Dr. Newell Coral in clinic today for suture removal  as well as evaluation of his back, however do to increased pain and scheduling difficulties, the patient was felt to need further evaluation in the emergency department as well as possible admission. He denies any back pain at the surgical site, fevers, drainage from his incision. Describes the pain as similar to previous radicular pains, worse with movement, standing and not improved with his home regimen of 10 mg of oxycodone every 2-3 hours.  On arrival patient is afebrile, hemodynamically stable. Incision site is mildly erythematous with no drainage, warmth, incision starting to heal. Pain with elevation of the left straight leg as well as on standing. No midline tenderness in the lumbar spine. No loss of bowel or bladder and no perianal numbness. No weakness in lower extremities. Low concern for cauda equina in this patient. Patient is at risk for postsurgical infection, so evaluated with basic labs, blood cultures. Dr. Newell Coral was present at the bedside and will admit the patient for further evaluation and pain control.  Plain films show no acute changes. Laboratory workup unremarkable. Admitted to the floor in good condition. Patient was discussed with my attending, Dr. Oletta Lamas.   Dorna Leitz, MD 06/02/13 [phone]

## 2013-06-01 NOTE — ED Notes (Signed)
Reports pain unchanged since surgery 05-23-13, sent here by MD's office for pain & to have Dr. Bettina Gavia paged upon arrival to ED. States last took pain med at 1200 (2 oxycodone). States pain less when laying down, increases to 10/10 upon standing

## 2013-06-01 NOTE — ED Notes (Addendum)
Admitting MD, Dr. Bettina Gavia at bedside.

## 2013-06-01 NOTE — H&P (Signed)
Subjective: Patient is a 79 y.o. male who is admitted for treatment of disabling left lumbar radicular pain. Patient is status post an L5-S1 lumbar decompression and arthrodesis 3 weeks ago. He did well initially, but then developed disabling left lumbar radicular pain. He was readmitted and taken to surgery 9 days ago for exploration of the lumbar wound and explantation of the left interbody implant.  He had good relief of the radicular pain and was discharged to home 6 days ago. I saw him back in the office 2 days later for a wound check because one of the sutures have come out, and he was to return today for removal of some of the staples and sutures. However he called the office earlier today complaining of disabling recurrent left lumbar radicular pain. Unfortunately I was unavailable to see him in the office and he therefore came to the [facility] emergency room for evaluation.  The patient explains that he is comfortable when lying flat at that with sitting or standing he develops searing burning pain thru the left buttock and posterior thigh. He is wearing his lumbar brace when up and about. He has been using Percocet at home for pain.   Patient Active Problem List   Diagnosis Date Noted  . Chronic radicular low back pain 12/15/2012  . Right elbow pain 07/18/2012  . GOUT, UNSPECIFIED 02/20/2010  . ERECTILE DYSFUNCTION, ORGANIC 02/20/2010  . KNEE PAIN 10/10/2009  . PATELLAR TENDINITIS 10/10/2009  . HYPERLIPIDEMIA, WITH HIGH HDL 10/18/2008  . CARPAL TUNNEL SYNDROME, LEFT 04/13/2008  . ACTINIC KERATOSIS, HEAD 03/23/2008  . BENIGN PROSTATIC HYPERTROPHY, WITH URINARY OBSTRUCTION 10/16/2007  . CAD 08/18/2007  . UNS ADVRS EFF UNS RX MEDICINAL&BIOLOGICAL SBSTNC 08/18/2007  . NEOPLASM, MALIGNANT, SKIN, TRUNK 05/09/2007  . HYPERTENSION 05/09/2007  . ALLERGIC RHINITIS 03/27/2007  . LOW BACK PAIN 03/27/2007  . SKIN CANCER, HX OF 03/27/2007   Past Medical History  Diagnosis Date  .  ACTINIC KERATOSIS, HEAD 03/23/2008  . ALLERGIC RHINITIS 03/27/2007  . BENIGN PROSTATIC HYPERTROPHY, WITH URINARY OBSTRUCTION 10/16/2007  . CARPAL TUNNEL SYNDROME, LEFT 04/13/2008  . ERECTILE DYSFUNCTION, ORGANIC 02/20/2010  . Gout, unspecified 02/20/2010  . HYPERLIPIDEMIA, WITH HIGH HDL 10/18/2008  . KNEE PAIN 10/10/2009  . LOW BACK PAIN 03/27/2007  . NEOPLASM, MALIGNANT, SKIN, TRUNK 05/09/2007  . Patellar tendinitis 10/10/2009  . SKIN CANCER, HX OF 03/27/2007  . UNS ADVRS EFF UNS RX MEDICINAL&BIOLOGICAL SBSTNC 08/18/2007  . HYPERTENSION 05/09/2007    sees Dr. Darryll Capers  . Headache(784.0)     migraines hx of  . Chronic neck pain   . Chronic back pain   . CAD 08/18/2007    "patient denies any issues with heart, does not see cardiologist"  . Urinary urgency   . History of melanoma   . Arthritis     Past Surgical History  Procedure Laterality Date  . Knee arthroscopy    . Cholecystectomy    . Nasal sinus surgery    . Vasectomy    . Lumbar fusions    . Joint replacement surgery    . Rectal fissure    . Lateral fusion lumbar spine, transverse    . Laminectomy    . Lumbar fusion    . Rectal fissure    . Vitrectomy      right and left 2013  . Carpal tunnel release    . Eye surgery      bilateral cataracts removed and vitrectomies  . Blepharoplasty    .  Back surgery    . Posterior cervical fusion/foraminotomy Right 10/01/2012    Procedure: POSTERIOR CERVICAL FUSION/FORAMINOTOMY LEVEL 1;  Surgeon: Hewitt Shorts, MD;  Location: [facility];  Service: Neurosurgery;  Laterality: Right;  Right Cervical seven thoracic one Cervical laminectomy/foraminotomy with posterior cervical arthrodesis   . Joint replacement Right     knee  . Appendectomy  1969  . Lumbar laminectomy/decompression microdiscectomy Left 11/03/2012    Procedure: LUMBAR LAMINECTOMY/DECOMPRESSION MICRODISCECTOMY 1 LEVEL;  Surgeon: Hewitt Shorts, MD;  Location: [facility];  Service: Neurosurgery;  Laterality: Left;  LEFT  L5S1 laminotomy foraminotomy and possible microdiskectomy  . Melanoma excision    . Posterior lumbar fusion N/A 05/23/2013    Procedure: exploration of lumbar wound, explantation of left interbody implant.;  Surgeon: Hewitt Shorts, MD;  Location: [facility] OR;  Service: Neurosurgery;  Laterality: N/A;     (Not in a hospital admission) Allergies  Allergen Reactions  . Gabapentin     Lower extremity edema    History  Substance Use Topics  . Smoking status: Former Smoker -- 1.00 packs/day for 33 years    Quit date: 09/29/1981  . Smokeless tobacco: Not on file  . Alcohol Use: 1.5 oz/week    3 drink(s) per week     Comment: daily    Family History  Problem Relation Age of Onset  . Dementia Mother   . Anemia Father   . Heart disease Father      Review of Systems A comprehensive review of systems was negative.  Objective: Vital signs in last 24 hours: Temp:  [98.2 F (36.8 C)] 98.2 F (36.8 C) (10/20 1551) Pulse Rate:  [83] 83 (10/20 1551) Resp:  [18] 18 (10/20 1551) BP: (148)/(77) 148/77 mmHg (10/20 1551) SpO2:  [96 %] 96 % (10/20 1551) Weight:  [81.647 kg (180 lb)] 81.647 kg (180 lb) (10/20 1551)  EXAM: Patient is a well-developed well nourished white male in discomfort, but no acute distress.  Lungs are clear to auscultation , the patient has symmetrical respiratory excursion. Heart has a regular rate and rhythm normal S1 and S2 no murmur.   Abdomen is soft nontender nondistended bowel sounds are present. Extremity examination shows no clubbing cyanosis or edema. Lumbar incision has mild dark erythema around it, no drainage or purulence. Motor examination shows 5/5 strength in the iliopsoas, quadriceps, dorsi flexor, and plantar flexor, but the left EHL is 4+/5 in the right EHL is 5/5. Sensation is intact to pinprick. Reflexes are diminished bilaterally.  Data Review:CBC    Component Value Date/Time   WBC 8.4 05/20/2013 1703   RBC 4.27 05/20/2013 1703   HGB 12.6* 05/20/2013  1703   HCT 37.0* 05/20/2013 1703   PLT 559* 05/20/2013 1703   MCV 86.7 05/20/2013 1703   MCH 29.5 05/20/2013 1703   MCHC 34.1 05/20/2013 1703   RDW 13.1 05/20/2013 1703   LYMPHSABS 2.2 05/20/2013 1703   MONOABS 0.6 05/20/2013 1703   EOSABS 0.3 05/20/2013 1703   BASOSABS 0.0 05/20/2013 1703                          BMET    Component Value Date/Time   NA 141 05/20/2013 1703   K 3.7 05/20/2013 1703   CL 106 05/20/2013 1703   CO2 22 05/20/2013 1703   GLUCOSE 95 05/20/2013 1703   BUN 11 05/20/2013 1703   CREATININE 0.93 05/20/2013 1703   CALCIUM 8.9 05/20/2013 1703  GFRNONAA 78* 05/20/2013 1703   GFRAA >90 05/20/2013 1703     Assessment/Plan: Patient with disabling recurrent left lumbar radicular pain, not being successfully managed at home with Percocet. Afebrile. We'll admit for pain management, and workup. BMET, CBC with differential, blood cultures x2, and AP and lateral lumbar spine x-rays have been requested.  We'll make plans for further workup after reviewing the initial studies.  I feel he may benefit from medications for neuropathic pain. He had a bad reaction to gabapentin, causing fluid retention, and therefore we'll try Lyrica 75 mg twice a day.   Hewitt Shorts, MD 06/01/2013 6:16 PM

## 2013-06-02 ENCOUNTER — Inpatient Hospital Stay (HOSPITAL_COMMUNITY): Payer: Medicare Other

## 2013-06-02 NOTE — Progress Notes (Signed)
Subjective: Patient resting in bed, continues to complain of disabling left lumbar radicular pain. Explains that pain is aggravated by getting up to the bathroom, it is still hurting even now his back in bed. Patient refused SCDs yesterday. Continued to require morphine IM to manage pain.  Started on Lyrica last night.  Normal WBC, mild anemia, electrolytes unremarkable. AP and lateral lumbar spine x-rays do not show any noticeable change. The remaining interbody implant is in stable position, the mild spondylolisthesis at L5-S1 is unchanged, and the posterior instrumentation appears in stable position.  Objective: Vital signs in last 24 hours: Filed Vitals:   06/01/13 1940 06/01/13 2055 06/02/13 0209 06/02/13 0542  BP: 177/78 177/82 173/79 171/81  Pulse: 74 77 71 70  Temp:  98.3 F (36.8 C) 98.1 F (36.7 C) 97.7 F (36.5 C)  TempSrc:  Oral Oral Oral  Resp: 16 16 18 18   Height:      Weight:      SpO2:  98% 98% 100%    Intake/Output from previous day: 10/20 0701 - 10/21 0700 In: 120 [P.O.:120] Out: 2 [Urine:2] Intake/Output this shift:    Physical Exam:  Awake and alert, oriented. Following commands. Moving all 4 extremity is well.   CBC  Recent Labs  06/01/13 1940  WBC 8.8  HGB 10.9*  HCT 32.8*  PLT 633*   BMET  Recent Labs  06/01/13 1940  NA 139  K 3.9  CL 106  CO2 23  GLUCOSE 98  BUN 15  CREATININE 1.03  CALCIUM 9.2    Studies/Results: Dg Lumbar Spine 2-3 Views  06/01/2013   CLINICAL DATA:  Back pain.  EXAM: LUMBAR SPINE - 2-3 VIEW  COMPARISON:  MRI from 05/22/2013. CT scan from 05/21/2013. Plain films from 05/18/2013.  FINDINGS: Patient is status post PLIF at L5-S1. Stable since 05/18/2013. Diffuse degenerative changes are seen in the endplates. No new fracture is evident. As the minimal spondylolisthesis at L5-S1 appears unchanged since the prior plain film exam.  IMPRESSION: Stable exam with postoperative changes at L5-S1.   Electronically Signed   By:  Kennith Center M.D.   On: 06/01/2013 19:27    Assessment/Plan: Cause of disabling left lumbar radicular pain is unclear. Will check CT of the lumbar spine without contrast, may eventually need MRI of lumbar spine. Started on Lyrica yesterday, has tolerated so far.  Have asked nursing staff to remove 2 staples at the bottom of the incision.   Spoke with patient about the importance of wearing SCDs .  Also spoke with the nursing staff about applying SCDs.   Hewitt Shorts, MD 06/02/2013, 7:50 AM

## 2013-06-02 NOTE — Progress Notes (Signed)
2 staples removed as ordered from the incision.

## 2013-06-02 NOTE — Progress Notes (Signed)
UR complete.  Jordon Bourquin RN, MSN 

## 2013-06-03 NOTE — Progress Notes (Signed)
Subjective: Patient continues to have disabling left lumbar radicular pain, particularly with weightbearing. CT lumbar spine reviewed:  The overall alignment at L5-S1 appears stable, the remaining interbody implant does not appear to have changed in position, there does not appear to be canal or neural foraminal stenosis, there is some question of lucency particularly around the right S1 screw, there is some irregularity of the inferior L5 interbody endplate, but is not clear to me whether this might be related to the explantation of the left L5-S1 interbody implant.  Patient has been requiring fairly frequent morphine IM for pain control.  Objective: Vital signs in last 24 hours: Filed Vitals:   06/03/13 0121 06/03/13 0545 06/03/13 0942 06/03/13 1358  BP: 178/89 162/63 153/69 134/68  Pulse: 85 75 87 76  Temp: 98.5 F (36.9 C) 98.4 F (36.9 C) 98.3 F (36.8 C) 97.9 F (36.6 C)  TempSrc: Oral Oral Oral Oral  Resp: 18 18 18 18   Height:      Weight:      SpO2: 96% 97% 96% 95%    Intake/Output from previous day:   Intake/Output this shift: Total I/O In: -  Out: 1 [Urine:1]  Physical Exam:  Wound examined: Mild erythema, no drainage. Sutures other than retention suture removed after isopropyl alcohol prep. Honeycomb dressing applied.  CBC  Recent Labs  06/01/13 1940  WBC 8.8  HGB 10.9*  HCT 32.8*  PLT 633*   BMET  Recent Labs  06/01/13 1940  NA 139  K 3.9  CL 106  CO2 23  GLUCOSE 98  BUN 15  CREATININE 1.03  CALCIUM 9.2    Studies/Results: Dg Lumbar Spine 2-3 Views  06/01/2013   CLINICAL DATA:  Back pain.  EXAM: LUMBAR SPINE - 2-3 VIEW  COMPARISON:  MRI from 05/22/2013. CT scan from 05/21/2013. Plain films from 05/18/2013.  FINDINGS: Patient is status post PLIF at L5-S1. Stable since 05/18/2013. Diffuse degenerative changes are seen in the endplates. No new fracture is evident. As the minimal spondylolisthesis at L5-S1 appears unchanged since the prior plain  film exam.  IMPRESSION: Stable exam with postoperative changes at L5-S1.   Electronically Signed   By: Kennith Center M.D.   On: 06/01/2013 19:27   Ct Lumbar Spine Wo Contrast  06/02/2013   CLINICAL DATA:  79 year old male with recurrent left low back pain and radicular pain. L5-S1 decompression and arthrodesis 3 weeks ago. Subsequent exploratory surgery for possible postoperative infection, but no purulent material discovered on wound exploration. L5-S1 interbody implant revision at that time. Initial encounter.  EXAM: CT LUMBAR SPINE WITHOUT CONTRAST  TECHNIQUE: Multidetector CT imaging of the lumbar spine was performed without intravenous contrast administration. Multiplanar CT image reconstructions were also generated.  COMPARISON:  Lumbar radiographs 05/2013. Lumbar MRI 05/22/2013, CT 05/21/2013, and earlier.  FINDINGS: Same numbering system as on prior studies.  Aortoiliac calcified atherosclerosis noted. Negative visualized abdominal viscera.  Postoperative changes to the posterior paraspinal soft tissues. Dressing material in place. No definite fluid collection by CT.  Stable visualized sacrum and SI joints. Anterolisthesis of L5 on S1 appears stable measuring 9 mm. Mild irregularity of the inferior L5 endplate is not significantly changed.  Other lumbar and visualized lower thoracic vertebrae appear stable and intact.  No spinal stenosis from T11-T12 to L1-L2.  L2-L3: Mild circumferential disc bulge and moderate to severe facet hypertrophy appears stable. Vacuum right facet phenomena. Borderline to mild spinal stenosis not significantly changed.  L3-L4: Partially calcified circumferential disc osteophyte complex eccentric  to the right. Severe right and moderate left facet hypertrophy appears stable. Right foraminal and lateral recess stenosis are stable. No significant spinal stenosis at this level.  L4-L5: Stable circumferential disc osteophyte complex. Partial decompression of this level. Bilateral  facet degeneration greater on the left, with subchondral cystic change on the left. No significant stenosis.  L5-S1: L5 transpedicular screws appear stable and intact. Questionable lucency developing along the course of the S1 screws, more so the right (see sagittal image 28). S1 transpedicular screws intact. Left side interbody implant has been removed. Right of midline interbody implant may be slightly more posteriorly positioned, but this does not appear to be significant given the degree of spondylolisthesis. Decompression posteriorly. Hardware streak artifact. No definite spinal stenosis. Small volume gas along the margins of the spinal canal appears increased, may be postoperative. No definite foraminal stenosis.  IMPRESSION: 1. Stable L5-S1 height and alignment. Stable mild irregularity of the L5 inferior endplate. Interval removal of the left side L5-S1 interbody implant, that on the right right appears not significantly changed. Possible developing lucency along the S1 pedicle screws, more so the right (see sagittal image 28). No definite stenosis at this level.  2. Stable advanced disc, endplate, and facet degeneration at L3-L4 and L4-L5. Stable advanced right facet degeneration at L2-L3.   Electronically Signed   By: Augusto Gamble M.D.   On: 06/02/2013 10:09    Assessment/Plan: We'll have patient shower. We'll need to consider long-acting narcotic analgesics to try to achieve better pain control. Continuing Lyrica.   Hewitt Shorts, MD 06/03/2013, 3:36 PM

## 2013-06-04 ENCOUNTER — Inpatient Hospital Stay (HOSPITAL_COMMUNITY): Payer: Medicare Other

## 2013-06-04 MED ORDER — PREGABALIN 75 MG PO CAPS
75.0000 mg | ORAL_CAPSULE | Freq: Three times a day (TID) | ORAL | Status: DC
  Administered 2013-06-04 – 2013-06-05 (×5): 75 mg via ORAL
  Filled 2013-06-04 (×5): qty 1

## 2013-06-04 MED ORDER — OXYCODONE HCL ER 10 MG PO T12A
10.0000 mg | EXTENDED_RELEASE_TABLET | Freq: Two times a day (BID) | ORAL | Status: DC
  Administered 2013-06-04 – 2013-06-09 (×12): 10 mg via ORAL
  Filled 2013-06-04 (×13): qty 1

## 2013-06-04 MED ORDER — NABUMETONE 500 MG PO TABS
500.0000 mg | ORAL_TABLET | Freq: Two times a day (BID) | ORAL | Status: DC
  Administered 2013-06-04 – 2013-06-09 (×12): 500 mg via ORAL
  Filled 2013-06-04 (×15): qty 1

## 2013-06-04 NOTE — Progress Notes (Signed)
Old dressing removed, pt showered without assistance, new honeycomb dressing applied.

## 2013-06-04 NOTE — ED Provider Notes (Signed)
I saw and evaluated the patient, reviewed the resident's note and I agree with the findings and plan.  EKG Interpretation     Ventricular Rate:    PR Interval:    QRS Duration:   QT Interval:    QTC Calculation:   R Axis:     Text Interpretation:              Pt with chronic back pain, patient of Dr. Newell Coral, had recent back surgery, comes in with worsening exacerbation of back pain and radiculopathy.  SHortly after arrival, Dr. Newell Coral arrived to see patient and admitted pt.   Gavin Pound. Demitrios Molyneux, MD 06/04/13 1002

## 2013-06-04 NOTE — Progress Notes (Signed)
Filed Vitals:   06/03/13 2107 06/04/13 0151 06/04/13 0610 06/04/13 0950  BP: 141/77 151/90 183/83 167/75  Pulse: 83 74 68 76  Temp: 98.3 F (36.8 C) 98.5 F (36.9 C) 98.1 F (36.7 C) 97.7 F (36.5 C)  TempSrc: Oral Oral Oral Oral  Resp: 18 18  20   Height:      Weight:      SpO2: 100% 100% 99% 98%     Patient continued to have disabling pain into the buttock, thigh, and leg.  Patient says that he can't sit up the meals pain is too uncomfortable when sitting on his bottom. Tolerating Lyrica well, but is unconvinced whether it is having any beneficial effect.  Plan: Will increase Lyrica to 75 mg 3 times a day (from twice a day), add Relafen 500 mg twice a day, and add OxyContin 10 mg every 12 hours.  Will check AP pelvis of bilateral hip x-rays to rule out significant left hip pathology.  Hewitt Shorts, MD 06/04/2013, 12:04 PM

## 2013-06-05 NOTE — Progress Notes (Signed)
Filed Vitals:   06/04/13 1431 06/04/13 1909 06/04/13 2139 06/05/13 0608  BP: 140/70 156/75 136/66 178/75  Pulse: 80 79 72 74  Temp: 98.2 F (36.8 C) 98 F (36.7 C) 98.3 F (36.8 C) 97.6 F (36.4 C)  TempSrc: Oral Oral Oral Oral  Resp: 20 20 18 18   Height:      Weight:      SpO2: 98% 96% 98% 98%    Patient continues to complain of pain with standing or sitting. Wound without change. X-rays of the pelvis and hipsdo not show any significant arthritic degenerative change or deformity. Several medications added (OxyContin and Relafen) and increased (Lyrica) yesterday. Still needing morphine IM when necessary. We'll need to allow added and adjusted medications to have a opportunity to work.  Plan: Encouraged mobility as feasible. We'll continue to make adjustments to medical regimen as necessary, but want to allow the most recent changes to have an opportunity to have an effect.  Hewitt Shorts, MD 06/05/2013, 8:05 AM

## 2013-06-06 MED ORDER — PREGABALIN 50 MG PO CAPS
100.0000 mg | ORAL_CAPSULE | Freq: Three times a day (TID) | ORAL | Status: DC
  Administered 2013-06-06 – 2013-06-09 (×12): 100 mg via ORAL
  Filled 2013-06-06 (×13): qty 2

## 2013-06-06 NOTE — Progress Notes (Signed)
Pt now c/o R leg pain, the same type/severity pain that he has been feeling in his L leg.  Pt was standing and eating his lunch when he called RN to report this.  Pt is ambulating around the room without assistance throughout the day as well.  Pt showered independently and new honeycomb dressing applied.  Will notify MD.

## 2013-06-06 NOTE — Progress Notes (Signed)
Patient ID: [name], male   DOB: 1932/07/30, 79 y.o.   MRN: [id number] Subjective:  the patient is alert and pleasant. He continues to complain of left leg pain. He continues to use IM morphine.  Objective: Vital signs in last 24 hours: Temp:  [97.6 F (36.4 C)-98.1 F (36.7 C)] 97.7 F (36.5 C) (10/25 0700) Pulse Rate:  [68-79] 72 (10/25 0700) Resp:  [18-20] 20 (10/25 0700) BP: (123-191)/(60-85) 183/71 mmHg (10/25 0700) SpO2:  [93 %-98 %] 98 % (10/25 0700)  Intake/Output from previous day: 10/24 0701 - 10/25 0700 In: 960 [P.O.:960] Out: -  Intake/Output this shift:    Physical exam the patient is alert and oriented. His strength is normal his bilateral quadriceps, gastrocnemius, dorsiflexors.  Lab Results: No results found for this basename: WBC, HGB, HCT, PLT,  in the last 72 hours BMET No results found for this basename: NA, K, CL, CO2, GLUCOSE, BUN, CREATININE, CALCIUM,  in the last 72 hours  Studies/Results: Dg Pelvis Comp Min 3v  06/04/2013   CLINICAL DATA:  Left hip pain  EXAM: JUDET PELVIS - 3+ VIEW  COMPARISON:  None.  FINDINGS: There is no acute fracture or dislocation. There mild degenerative joint changes of bilateral hips with narrowed hip joint spaces. Patient is status post prior surgery of the lower lumbar spine.  IMPRESSION: No acute fracture dislocation. Mild degenerative joint changes of bilateral hips.   Electronically Signed   By: Sherian Rein M.[name].   On: 06/04/2013 20:20    Assessment/Plan: Left leg pain: I will increase his Lyrica.   LOS: 5 days     [name] 06/06/2013, 9:09 AM

## 2013-06-07 NOTE — Progress Notes (Signed)
No issues overnight. Pt continues to c/o L leg pain, unchanged since yesterday. REquired IM morphine this am.  EXAM:  BP 168/74  Pulse 69  Temp(Src) 98 F (36.7 C) (Oral)  Resp 20  Ht 6' (1.829 m)  Wt 81.647 kg (180 lb)  BMI 24.41 kg/m2  SpO2 94%  Awake, alert, oriented  Speech fluent, appropriate  CN grossly intact  5/5 BUE/BLE   IMPRESSION:  79 y.o. male with left leg pain currently managed with medically  Still requiring IM morphine  PLAN: Will allow increase in Lyrica dosage yesterday to take effect.  Cont to mobilize as tolerated

## 2013-06-08 LAB — CULTURE, BLOOD (ROUTINE X 2)
Culture: NO GROWTH
Culture: NO GROWTH

## 2013-06-08 MED ORDER — MAGNESIUM HYDROXIDE 400 MG/5ML PO SUSP: 5.0000 mL | Freq: Once | ORAL | Status: DC

## 2013-06-08 NOTE — Progress Notes (Addendum)
Upon am assessment, pt incision is draining odorous serosanguinous and green drainage.  Drainage marked, will notify MD.  Asked pt to wait on shower so that MD could see the incision as is.  (Update) RN advised to allow pt to shower without dressing and to replace with dry dressing if any.

## 2013-06-08 NOTE — Progress Notes (Signed)
Filed Vitals:   06/08/13 0627 06/08/13 0702 06/08/13 1015 06/08/13 1555  BP: 199/73 140/72 179/74 149/68  Pulse:   73 74  Temp:   97.3 F (36.3 C) 98.3 F (36.8 C)  TempSrc:   Oral Oral  Resp:   18 18  Height:      Weight:      SpO2:   99% 95%     Patient continuing to have significant left lumbar radicular pain. Dressing removed and the actual wound healing, with minimal dull erythema around it, and no drainage. We'll leave the wound open to air. Have continued to encouraged to shower each day. Removed retention suture. Encouraged patient to be ambulating as feasible.  He explained that he is able to stand some, and ambulate on a limited basis, but is unable to sit because it exacerbates the radicular pain.  Plan: We'll recheck laboratories in a.m.  Hewitt Shorts, MD 06/08/2013, 5:32 PM

## 2013-06-09 LAB — CBC WITH DIFFERENTIAL/PLATELET
Basophils Absolute: 0 10*3/uL (ref 0.0–0.1)
Basophils Relative: 0 % (ref 0–1)
Eosinophils Absolute: 0.7 10*3/uL (ref 0.0–0.7)
Eosinophils Relative: 7 % — ABNORMAL HIGH (ref 0–5)
HCT: 34.7 % — ABNORMAL LOW (ref 39.0–52.0)
Hemoglobin: 11.4 g/dL — ABNORMAL LOW (ref 13.0–17.0)
Lymphocytes Relative: 29 % (ref 12–46)
Lymphs Abs: 2.8 10*3/uL (ref 0.7–4.0)
MCH: 29.2 pg (ref 26.0–34.0)
MCHC: 32.9 g/dL (ref 30.0–36.0)
MCV: 89 fL (ref 78.0–100.0)
Monocytes Absolute: 1.4 10*3/uL — ABNORMAL HIGH (ref 0.1–1.0)
Monocytes Relative: 14 % — ABNORMAL HIGH (ref 3–12)
Neutro Abs: 4.9 10*3/uL (ref 1.7–7.7)
Neutrophils Relative %: 50 % (ref 43–77)
Platelets: 429 10*3/uL — ABNORMAL HIGH (ref 150–400)
RBC: 3.9 MIL/uL — ABNORMAL LOW (ref 4.22–5.81)
RDW: 13.7 % (ref 11.5–15.5)
WBC: 9.8 10*3/uL (ref 4.0–10.5)

## 2013-06-09 LAB — BASIC METABOLIC PANEL
BUN: 24 mg/dL — ABNORMAL HIGH (ref 6–23)
CO2: 27 mEq/L (ref 19–32)
Calcium: 9 mg/dL (ref 8.4–10.5)
Chloride: 101 mEq/L (ref 96–112)
Creatinine, Ser: 1.21 mg/dL (ref 0.50–1.35)
GFR calc Af Amer: 64 mL/min — ABNORMAL LOW (ref >90)
GFR calc non Af Amer: 55 mL/min — ABNORMAL LOW (ref >90)
Glucose, Bld: 83 mg/dL (ref 70–99)
Potassium: 4.6 mEq/L (ref 3.5–5.1)
Sodium: 134 mEq/L — ABNORMAL LOW (ref 135–145)

## 2013-06-09 NOTE — Progress Notes (Signed)
UR complete.  Derrick Tiegs RN, MSN 

## 2013-06-09 NOTE — Progress Notes (Signed)
Patient told nurse that since he started with Lyrica (but not quite sure that's from Lyrica) he noticed having some involuntary jerky movements looks like quick muscle twiching on his arms and legs which is more frequent now.

## 2013-06-09 NOTE — Progress Notes (Signed)
Filed Vitals:   06/08/13 2213 06/09/13 0226 06/09/13 0557 06/09/13 0848  BP: 152/65 142/71 152/69 211/89  Pulse: 73 74 65 72  Temp: 97.7 F (36.5 C) 98 F (36.7 C) 97.9 F (36.6 C) 97.7 F (36.5 C)  TempSrc: Oral Oral Oral Oral  Resp: 20 18 20 20   Height:      Weight:      SpO2: 97% 99% 93% 97%    CBC  Recent Labs  06/09/13 0610  WBC 9.8  HGB 11.4*  HCT 34.7*  PLT 429*   BMET  Recent Labs  06/09/13 0610  NA 134*  K 4.6  CL 101  CO2 27  GLUCOSE 83  BUN 24*  CREATININE 1.21  CALCIUM 9.0    Patient continueding to have significant radicular pain to left lower extremity. Is able to stand, on a limited basis, such as for eating, but cannot sit because the pain is significant worse with sitting. Continues on OxyContin 10 every 12, Percocet when necessary, Lyrica 100 3 times a day, Relafen 500 twice a day, and Flexeril when necessary. Limited ambulation, occasional jerking spasms.  Wound with mild dull erythema, no drainage. We'll continue to leave open to air. Labs today stable.  Plan: Spoke with patient, and his sister by phone, about current treatment regimen. Encouraged to try to increase ambulation and mobility today.  Hewitt Shorts, MD 06/09/2013, 8:50 AM

## 2013-06-10 MED ORDER — OXYCODONE HCL ER 10 MG PO T12A: 10.0000 mg | EXTENDED_RELEASE_TABLET | Freq: Two times a day (BID) | ORAL | Status: DC

## 2013-06-10 MED ORDER — PREGABALIN 100 MG PO CAPS: 100.0000 mg | ORAL_CAPSULE | Freq: Three times a day (TID) | ORAL | Status: DC

## 2013-06-10 MED ORDER — OXYCODONE-ACETAMINOPHEN 5-325 MG PO TABS: 1.0000 | ORAL_TABLET | ORAL | Status: DC | PRN

## 2013-06-10 MED ORDER — NABUMETONE 500 MG PO TABS: 500.0000 mg | ORAL_TABLET | Freq: Two times a day (BID) | ORAL | Status: DC

## 2013-06-10 MED ORDER — CYCLOBENZAPRINE HCL 10 MG PO TABS: 10.0000 mg | ORAL_TABLET | Freq: Three times a day (TID) | ORAL | Status: DC | PRN

## 2013-06-10 NOTE — Discharge Summary (Signed)
Physician Discharge Summary  Patient ID: [name] MRN: [id number] DOB/AGE: [birth year] 77 y.o.  Admit date: 06/01/2013 Discharge date: 06/10/2013  Admission Diagnoses:  Left lumbar radicular pain, status post decompression and arthrodesis, with subsequent revision  Discharge Diagnoses:  Left lumbar radicular pain, status post decompression and arthrodesis, with subsequent revision; pseudoarthrosis  Discharged Condition: good  Hospital Course: Patient was admitted about a week and a half following revision of a lumbar decompression and arthrodesis. He presented with recurrent disabling left lumbar radicular pain. Patient is admitted for pain management. He was evaluated with lumbosacral spine x-rays and a CT scan of the lumbar spine. A CT revealed haloing around the pedicle screws at L5 and S1 bilaterally, it did not show neural compression. Patient was initially started Lyrica, and the dose was gradually increased to 100 mg 3 times a day. He was started on Relafen 500 mg twice a day. He was requiring both morphine IM and Percocet, and we eventually saw, OxyContin 10 mg every 12 hours, continued the Percocet when necessary, but were eventually able to stop the morphine IM. We've also been using Flexeril 10 mg 3 times a day when necessary muscle spasms.  This case and imaging was reviewed with several of my partners within the range of clinical options considered. In the end it was elected to allow him further time to heal. We have requested external bone growth stimulator, and it will be provided to the patient once it is approved by the insurance carrier. He is gradually had any radicular pain he has, in his been able to gradually increase his standing, and ambulation. Following discharge he is to ambulate several times each day, but in a limited fashion.  During the hospitalization we removed the staples and suture from his lumbar incision. It has been slowly healing, and has had mild dull  erythema around it, but no drainage. Repeat CBCs have shown no elevation of white blood cell count. At this point we're going to to be open to air, he is showering each day and allow him to shower to run over the incision area.  I've asked the patient to return for followup with me in 2 weeks, or sooner if needed.  Discharge Exam: Blood pressure 182/91, pulse 80, temperature 97.6 F (36.4 C), temperature source Oral, resp. rate 18, height 6' (1.829 m), weight 81.647 kg (180 lb), SpO2 99.00%.  Disposition: Home   Future Appointments Provider Department Dept Phone   07/08/2013 11:45 AM Stacie Glaze, MD [facility] [phone]       Medication List         cyclobenzaprine 10 MG tablet  Commonly known as:  FLEXERIL  Take 1 tablet (10 mg total) by mouth 3 (three) times daily as needed for muscle spasms.     diltiazem 300 MG 24 hr capsule  Commonly known as:  CARDIZEM CD  Take 300 mg by mouth daily.     magnesium hydroxide 800 MG/5ML suspension  Commonly known as:  MILK OF MAGNESIA  Take 15 mLs by mouth every other day.     nabumetone 500 MG tablet  Commonly known as:  RELAFEN  Take 1 tablet (500 mg total) by mouth 2 (two) times daily.     OxyCODONE 10 mg T12a 12 hr tablet  Commonly known as:  OXYCONTIN  Take 1 tablet (10 mg total) by mouth every 12 (twelve) hours.     oxyCODONE-acetaminophen 5-325 MG per tablet  Commonly known as:  PERCOCET/ROXICET  Take 1-2 tablets by mouth every 4 (four) hours as needed for pain.     oxyCODONE-acetaminophen 5-325 MG per tablet  Commonly known as:  PERCOCET/ROXICET  Take 1-2 tablets by mouth every 4 (four) hours as needed for pain.     pregabalin 100 MG capsule  Commonly known as:  LYRICA  Take 1 capsule (100 mg total) by mouth 3 (three) times daily.     rosuvastatin 20 MG tablet  Commonly known as:  CRESTOR  Take 20 mg by mouth once a week. On Friday     telmisartan 80 MG tablet  Commonly known as:  MICARDIS   Take 80 mg by mouth daily.         Signed: Hewitt Shorts, MD 06/10/2013, 8:12 AM

## 2013-06-10 NOTE — Progress Notes (Signed)
Patient d/c this am, assessments remained unchanged prior to discharge. Discharge instructions given by the MD and RN re-enforced the teaching. Prescriptions also given.

## 2013-06-29 ENCOUNTER — Telehealth: Payer: Self-pay | Admitting: Internal Medicine

## 2013-06-29 ENCOUNTER — Other Ambulatory Visit: Payer: Self-pay | Admitting: *Deleted

## 2013-06-29 NOTE — Telephone Encounter (Signed)
Pt requesting to speak with [facility] regarding past medical history of two major back surgeries. Pt states his computer is not working and he is unable to sent a message via Mychart.

## 2013-06-29 NOTE — Telephone Encounter (Signed)
Talked with pt and only wanted to make sure dr Lovell Sheehan knew about his surgeries

## 2013-06-30 DIAGNOSIS — M545 Low back pain, unspecified: Secondary | ICD-10-CM | POA: Diagnosis not present

## 2013-07-08 ENCOUNTER — Other Ambulatory Visit: Payer: Self-pay | Admitting: *Deleted

## 2013-07-08 ENCOUNTER — Ambulatory Visit (INDEPENDENT_AMBULATORY_CARE_PROVIDER_SITE_OTHER): Payer: Medicare Other | Admitting: Internal Medicine

## 2013-07-08 VITALS — BP 170/74 | HR 80 | Temp 98.0°F | Resp 18 | Ht 72.0 in | Wt 180.0 lb

## 2013-07-08 DIAGNOSIS — T50904A Poisoning by unspecified drugs, medicaments and biological substances, undetermined, initial encounter: Secondary | ICD-10-CM | POA: Diagnosis not present

## 2013-07-08 DIAGNOSIS — K5909 Other constipation: Secondary | ICD-10-CM

## 2013-07-08 DIAGNOSIS — N401 Enlarged prostate with lower urinary tract symptoms: Secondary | ICD-10-CM | POA: Diagnosis not present

## 2013-07-08 DIAGNOSIS — N138 Other obstructive and reflux uropathy: Secondary | ICD-10-CM

## 2013-07-08 DIAGNOSIS — K5903 Drug induced constipation: Secondary | ICD-10-CM

## 2013-07-08 DIAGNOSIS — I10 Essential (primary) hypertension: Secondary | ICD-10-CM | POA: Diagnosis not present

## 2013-07-08 MED ORDER — METHOCARBAMOL 500 MG PO TABS: 500.0000 mg | ORAL_TABLET | Freq: Three times a day (TID) | ORAL | Status: DC | PRN

## 2013-07-08 MED ORDER — LINACLOTIDE 145 MCG PO CAPS: 145.0000 ug | ORAL_CAPSULE | Freq: Every day | ORAL | Status: DC

## 2013-07-08 MED ORDER — TERAZOSIN HCL 1 MG PO CAPS: 1.0000 mg | ORAL_CAPSULE | Freq: Every day | ORAL | Status: DC

## 2013-07-08 NOTE — Progress Notes (Signed)
Subjective:    Patient ID: [name], male    DOB: 06-16-1932, 79 y.o.   MRN: [id number]  Hypertension Associated symptoms include neck pain. Pertinent negatives include no shortness of breath.  Hyperlipidemia Associated symptoms include myalgias. Pertinent negatives include no shortness of breath.   Significant radicular pain for left radicular pain to foot Pain is driving weight loss. Constipated due to the narcotics Using multiple drugs including stool softeners and fiber    Review of Systems  Constitutional: Negative for fever and fatigue.  HENT: Negative for congestion, hearing loss and postnasal drip.   Eyes: Negative for discharge, redness and visual disturbance.  Respiratory: Negative for cough, shortness of breath and wheezing.   Cardiovascular: Negative for leg swelling.  Gastrointestinal: Positive for constipation. Negative for abdominal pain and abdominal distention.  Genitourinary: Negative for urgency and frequency.  Musculoskeletal: Positive for arthralgias, gait problem, myalgias and neck pain. Negative for joint swelling.  Skin: Negative for color change and rash.  Neurological: Negative for weakness and light-headedness.  Hematological: Negative for adenopathy.  Psychiatric/Behavioral: Negative for behavioral problems.   Past Medical History  Diagnosis Date  . ACTINIC KERATOSIS, HEAD 03/23/2008  . ALLERGIC RHINITIS 03/27/2007  . BENIGN PROSTATIC HYPERTROPHY, WITH URINARY OBSTRUCTION 10/16/2007  . CARPAL TUNNEL SYNDROME, LEFT 04/13/2008  . ERECTILE DYSFUNCTION, ORGANIC 02/20/2010  . Gout, unspecified 02/20/2010  . HYPERLIPIDEMIA, WITH HIGH HDL 10/18/2008  . KNEE PAIN 10/10/2009  . LOW BACK PAIN 03/27/2007  . NEOPLASM, MALIGNANT, SKIN, TRUNK 05/09/2007  . Patellar tendinitis 10/10/2009  . SKIN CANCER, HX OF 03/27/2007  . UNS ADVRS EFF UNS RX MEDICINAL&BIOLOGICAL SBSTNC 08/18/2007  . HYPERTENSION 05/09/2007    sees Dr. Darryll Capers  . Headache(784.0)     migraines  hx of  . Chronic neck pain   . Chronic back pain   . CAD 08/18/2007    "patient denies any issues with heart, does not see cardiologist"  . Urinary urgency   . History of melanoma   . Arthritis     History   Social History  . Marital Status: Married    Spouse Name: N/A    Number of Children: N/A  . Years of Education: N/A   Occupational History  . Not on file.   Social History Main Topics  . Smoking status: Former Smoker -- 1.00 packs/day for 33 years    Quit date: 09/29/1981  . Smokeless tobacco: Not on file  . Alcohol Use: 1.5 oz/week    3 drink(s) per week     Comment: daily  . Drug Use: No  . Sexual Activity: Yes   Other Topics Concern  . Not on file   Social History Narrative  . No narrative on file    Past Surgical History  Procedure Laterality Date  . Knee arthroscopy    . Cholecystectomy    . Nasal sinus surgery    . Vasectomy    . Lumbar fusions    . Joint replacement surgery    . Rectal fissure    . Lateral fusion lumbar spine, transverse    . Laminectomy    . Lumbar fusion    . Rectal fissure    . Vitrectomy      right and left 2013  . Carpal tunnel release    . Eye surgery      bilateral cataracts removed and vitrectomies  . Blepharoplasty    . Back surgery    . Posterior cervical fusion/foraminotomy Right 10/01/2012  Procedure: POSTERIOR CERVICAL FUSION/FORAMINOTOMY LEVEL 1;  Surgeon: Hewitt Shorts, MD;  Location: [facility];  Service: Neurosurgery;  Laterality: Right;  Right Cervical seven thoracic one Cervical laminectomy/foraminotomy with posterior cervical arthrodesis   . Joint replacement Right     knee  . Appendectomy  1969  . Lumbar laminectomy/decompression microdiscectomy Left 11/03/2012    Procedure: LUMBAR LAMINECTOMY/DECOMPRESSION MICRODISCECTOMY 1 LEVEL;  Surgeon: Hewitt Shorts, MD;  Location: [facility];  Service: Neurosurgery;  Laterality: Left;  LEFT L5S1 laminotomy foraminotomy and possible microdiskectomy  .  Melanoma excision    . Posterior lumbar fusion N/A 05/23/2013    Procedure: exploration of lumbar wound, explantation of left interbody implant.;  Surgeon: Hewitt Shorts, MD;  Location: [facility] OR;  Service: Neurosurgery;  Laterality: N/A;    Family History  Problem Relation Age of Onset  . Dementia Mother   . Anemia Father   . Heart disease Father     Allergies  Allergen Reactions  . Gabapentin     Lower extremity edema    Current Outpatient Prescriptions on File Prior to Visit  Medication Sig Dispense Refill  . cyclobenzaprine (FLEXERIL) 10 MG tablet Take 1 tablet (10 mg total) by mouth 3 (three) times daily as needed for muscle spasms.  90 tablet  3  . diltiazem (CARDIZEM CD) 300 MG 24 hr capsule Take 300 mg by mouth daily.      . magnesium hydroxide (MILK OF MAGNESIA) 800 MG/5ML suspension Take 15 mLs by mouth every other day.      . OxyCODONE (OXYCONTIN) 10 mg T12A 12 hr tablet Take 1 tablet (10 mg total) by mouth every 12 (twelve) hours.  60 tablet  0  . oxyCODONE-acetaminophen (PERCOCET/ROXICET) 5-325 MG per tablet Take 1-2 tablets by mouth every 4 (four) hours as needed for pain.  75 tablet  0  . rosuvastatin (CRESTOR) 20 MG tablet Take 20 mg by mouth once a week. On Friday      . telmisartan (MICARDIS) 80 MG tablet Take 80 mg by mouth daily.        No current facility-administered medications on file prior to visit.    BP 170/74  Pulse 80  Temp(Src) 98 F (36.7 C)  Resp 18  Ht 6' (1.829 m)  Wt 180 lb (81.647 kg)  BMI 24.41 kg/m2       Objective:   Physical Exam  Constitutional: He appears well-developed and well-nourished.  HENT:  Head: Normocephalic and atraumatic.  Eyes: Conjunctivae are normal. Pupils are equal, round, and reactive to light.  Neck: Normal range of motion. Neck supple.  Cardiovascular: Normal rate and regular rhythm.   Pulmonary/Chest: Effort normal and breath sounds normal.  Abdominal: Soft. Bowel sounds are normal.            Assessment & Plan:  Back pain Robaxin trial  Stable HTN suggest generic for cost  Drug induced constipation add Linzess as a trial

## 2013-07-08 NOTE — Patient Instructions (Signed)
Stop  The Milk of magnesia and try the Linzess one a day for constipation Is needed you can add back the MOM

## 2013-07-08 NOTE — Progress Notes (Signed)
Pre visit review using our clinic review tool, if applicable. No additional management support is needed unless otherwise documented below in the visit note. 

## 2013-07-15 ENCOUNTER — Other Ambulatory Visit: Payer: Self-pay | Admitting: *Deleted

## 2013-07-15 ENCOUNTER — Encounter: Payer: Self-pay | Admitting: Internal Medicine

## 2013-07-15 MED ORDER — TERAZOSIN HCL 1 MG PO CAPS: 1.0000 mg | ORAL_CAPSULE | Freq: Every day | ORAL | Status: DC

## 2013-07-16 ENCOUNTER — Other Ambulatory Visit: Payer: Self-pay | Admitting: Internal Medicine

## 2013-07-16 ENCOUNTER — Encounter: Payer: Self-pay | Admitting: Physician Assistant

## 2013-07-16 ENCOUNTER — Telehealth: Payer: Self-pay | Admitting: Internal Medicine

## 2013-07-16 ENCOUNTER — Ambulatory Visit (INDEPENDENT_AMBULATORY_CARE_PROVIDER_SITE_OTHER): Payer: Medicare Other | Admitting: Physician Assistant

## 2013-07-16 ENCOUNTER — Encounter: Payer: Self-pay | Admitting: Internal Medicine

## 2013-07-16 VITALS — BP 136/84 | HR 89 | Temp 98.7°F | Resp 16 | Ht 72.0 in | Wt 186.5 lb

## 2013-07-16 DIAGNOSIS — M130 Polyarthritis, unspecified: Secondary | ICD-10-CM

## 2013-07-16 LAB — URIC ACID: Uric Acid, Serum: 4.6 mg/dL (ref 4.0–7.8)

## 2013-07-16 LAB — SEDIMENTATION RATE: Sed Rate: 30 mm/hr — ABNORMAL HIGH (ref 0–16)

## 2013-07-16 MED ORDER — METHYLPREDNISOLONE (PAK) 4 MG PO TABS: ORAL_TABLET | ORAL | Status: DC

## 2013-07-16 NOTE — Progress Notes (Signed)
Pre visit review using our clinic review tool, if applicable. No additional management support is needed unless otherwise documented below in the visit note/SLS  

## 2013-07-16 NOTE — Telephone Encounter (Signed)
Patient Information:  Caller Name: Rishard  Phone: [phone]  Patient: [name], [name]  Gender: Male  DOB: 1931/08/21  Age: 79 Years  PCP: Darryll Capers (Adults only)  Office Follow Up:  Does the office need to follow up with this patient?: Yes  Instructions For The Office: Patient requesting to be seen today for worsening pain in joints.  Concern with medication Robaxin- reviewed, no side effects of joint pain noted.  Please contact patinet for appt/guidance.   No availability noted.  RN Note:  Patient requesting to be seen today for worsening pain in joints.  Concern with medication Robaxin- reviewed, no side effects of joint pain noted.  Please contact patinet for appt/guidance.   No availability noted.  Symptoms  Reason For Call & Symptoms: Patient states Bilateral wrist and elbow pain for x2 days.  +Neck pain and discomfort.  Pain with hold the phone.  He states some mild swelling in backs of his hands. Afebrile.  Denies injury.  Started on Robaxin on 07/08/13 .  He reports that he is prone to Gout in his foot only and took Prednisone 20mg  x2 tabs at 03:00 a.m.  Reviewed Health History In EMR: Yes  Reviewed Medications In EMR: Yes  Reviewed Allergies In EMR: Yes  Reviewed Surgeries / Procedures: Yes  Date of Onset of Symptoms: 07/15/2013  Treatments Tried: Prednisone, Percocet  Treatments Tried Worked: No  Guideline(s) Used:  Arm Pain  Disposition Per Guideline:   See Today or Tomorrow in Office  Reason For Disposition Reached:   Patient wants to be seen  Advice Given:  Apply Heat to the Area:  Beginning 48 hours after an injury, apply a warm washcloth or heating pad for 10 minutes 3 times a day.  This will help increase blood flow and improve healing.  Apply Cold to the Area for First 48 Hours  Apply a cold pack or an ice bag (wrapped in a moist towel) to the area for 20 minutes. Repeat in 1 hour, then every 4 hours while awake.  Continue this for the first 48 hours  after an injury (Reason: to reduce the swelling and pain).  Rest:   You should try to avoid any exercise or activity that caused this pain for the next 3 days.  Pain Medicines:  For pain relief, you can take either acetaminophen, ibuprofen, or naproxen.  They are over-the-counter (OTC) pain drugs. You can buy them at the drugstore.  Ibuprofen (e.g., Motrin, Advil):  Take 400 mg (two 200 mg pills) by mouth every 6 hours.  Call Back If:  You become worse.  RN Overrode Recommendation:  Make Appointment  Patient requesting to be seen today for worsening pain in joints.  Concern with medication Robaxin- reviewed, no side effects of joint pain noted.  Please contact patinet for appt/guidance.   No availability noted.

## 2013-07-16 NOTE — Progress Notes (Signed)
Patient ID: [name], male   DOB: Sep 03, 1931, 79 y.o.   MRN: [id number]  Patient is a 79 year old Caucasian gentleman who presents to clinic today complaining of joint pain in bilateral wrists, bilateral elbows and neck x1 day.  Patient denies fever, chills, malaise or fatigue. Patient denies redness or swelling of joints. Joint pain is exacerbated by motion. Patient denies recent trauma. Patient denies history of rheumatoid arthritis. Does have history of osteoarthritis.  Patient not currently sexually active. Patient does endorse history of gout in the past, always affecting great toe.  Denies history of skin lesion or psoriasis.  Past Medical History  Diagnosis Date  . ACTINIC KERATOSIS, HEAD 03/23/2008  . ALLERGIC RHINITIS 03/27/2007  . BENIGN PROSTATIC HYPERTROPHY, WITH URINARY OBSTRUCTION 10/16/2007  . CARPAL TUNNEL SYNDROME, LEFT 04/13/2008  . ERECTILE DYSFUNCTION, ORGANIC 02/20/2010  . Gout, unspecified 02/20/2010  . HYPERLIPIDEMIA, WITH HIGH HDL 10/18/2008  . KNEE PAIN 10/10/2009  . LOW BACK PAIN 03/27/2007  . NEOPLASM, MALIGNANT, SKIN, TRUNK 05/09/2007  . Patellar tendinitis 10/10/2009  . SKIN CANCER, HX OF 03/27/2007  . UNS ADVRS EFF UNS RX MEDICINAL&BIOLOGICAL SBSTNC 08/18/2007  . HYPERTENSION 05/09/2007    sees Dr. Darryll Capers  . Headache(784.0)     migraines hx of  . Chronic neck pain   . Chronic back pain   . CAD 08/18/2007    "patient denies any issues with heart, does not see cardiologist"  . Urinary urgency   . History of melanoma   . Arthritis     Current Outpatient Prescriptions on File Prior to Visit  Medication Sig Dispense Refill  . diltiazem (CARDIZEM CD) 300 MG 24 hr capsule Take 300 mg by mouth daily.      . Linaclotide (LINZESS) 145 MCG CAPS capsule Take 1 capsule (145 mcg total) by mouth daily.  30 capsule    . magnesium hydroxide (MILK OF MAGNESIA) 800 MG/5ML suspension Take 15 mLs by mouth every other day.      . methocarbamol (ROBAXIN) 500 MG tablet Take 1 tablet  (500 mg total) by mouth every 8 (eight) hours as needed for muscle spasms.  90 tablet  3  . OxyCODONE (OXYCONTIN) 10 mg T12A 12 hr tablet Take 1 tablet (10 mg total) by mouth every 12 (twelve) hours.  60 tablet  0  . oxyCODONE-acetaminophen (PERCOCET/ROXICET) 5-325 MG per tablet Take 1-2 tablets by mouth every 4 (four) hours as needed for pain.  75 tablet  0  . rosuvastatin (CRESTOR) 20 MG tablet Take 20 mg by mouth once a week. On Friday      . telmisartan (MICARDIS) 80 MG tablet Take 80 mg by mouth daily.       Marland Kitchen terazosin (HYTRIN) 1 MG capsule Take 1 capsule (1 mg total) by mouth at bedtime.  30 capsule  1  . predniSONE (DELTASONE) 20 MG tablet TAKE 2 TABS FOR 2 DAYS THEN 1 TAB FOR 4 DAYS THEN 1/2 TAB FOR 4 DAYS  10 tablet  1   No current facility-administered medications on file prior to visit.    Allergies  Allergen Reactions  . Gabapentin     Lower extremity edema    Family History  Problem Relation Age of Onset  . Dementia Mother   . Anemia Father   . Heart disease Father     History   Social History  . Marital Status: Married    Spouse Name: N/A    Number of Children: N/A  . Years of  Education: N/A   Social History Main Topics  . Smoking status: Former Smoker -- 1.00 packs/day for 33 years    Quit date: 09/29/1981  . Smokeless tobacco: None  . Alcohol Use: 1.5 oz/week    3 drink(s) per week     Comment: daily  . Drug Use: No  . Sexual Activity: Yes   Other Topics Concern  . None   Social History Narrative  . None   Review of Systems - See HPI.  All other ROS are negative.  Filed Vitals:   07/16/13 1355  BP: 136/84  Pulse: 89  Temp: 98.7 F (37.1 C)  Resp: 16    Physical Exam  Vitals reviewed. Constitutional: He is oriented to person, place, and time and well-developed, well-nourished, and in no distress.  HENT:  Head: Normocephalic and atraumatic.  Right Ear: External ear normal.  Left Ear: External ear normal.  Nose: Nose normal.   Mouth/Throat: Oropharynx is clear and moist. No oropharyngeal exudate.  Eyes: Conjunctivae are normal.  Neck: Neck supple.  Cardiovascular: Normal rate, regular rhythm, normal heart sounds and intact distal pulses.   Pulmonary/Chest: Effort normal and breath sounds normal. No respiratory distress. He has no wheezes. He has no rales. He exhibits no tenderness.  Musculoskeletal:       Right elbow: He exhibits normal range of motion and no swelling. Tenderness found.       Left elbow: He exhibits normal range of motion and no swelling. Tenderness found.       Right wrist: He exhibits tenderness. He exhibits normal range of motion, no bony tenderness, no swelling and no crepitus.       Left wrist: He exhibits tenderness. He exhibits normal range of motion, no bony tenderness, no swelling, no effusion and no crepitus.       Cervical back: He exhibits no tenderness, no bony tenderness and no swelling.       Right forearm: Normal.       Left forearm: Normal.       Right hand: Normal.       Left hand: Normal.  Lymphadenopathy:    He has no cervical adenopathy.  Neurological: He is alert and oriented to person, place, and time. No cranial nerve deficit.  Skin: Skin is warm and dry. No rash noted. No erythema.  Psychiatric: Affect normal.     Recent Results (from the past 2160 hour(s))  SURGICAL PCR SCREEN     Status: None   Collection Time    05/05/13  9:32 AM      Result Value Range   MRSA, PCR NEGATIVE  NEGATIVE   Staphylococcus aureus NEGATIVE  NEGATIVE   Comment:            The Xpert SA Assay (FDA     approved for NASAL specimens     in patients over 19 years of age),     is one component of     a comprehensive surveillance     program.  Test performance has     been validated by [facility] for patients greater     than or equal to 43 year old.     It is not intended     to diagnose infection nor to     guide or monitor treatment.  COMPREHENSIVE METABOLIC PANEL      Status: Abnormal   Collection Time    05/05/13  9:34 AM      Result  Value Range   Sodium 139  135 - 145 mEq/L   Potassium 5.8 (*) 3.5 - 5.1 mEq/L   Chloride 103  96 - 112 mEq/L   CO2 28  19 - 32 mEq/L   Glucose, Bld 109 (*) 70 - 99 mg/dL   BUN 20  6 - 23 mg/dL   Creatinine, Ser 1.61  0.50 - 1.35 mg/dL   Calcium 9.8  8.4 - 09.6 mg/dL   Total Protein 6.9  6.0 - 8.3 g/dL   Albumin 3.9  3.5 - 5.2 g/dL   AST 21  0 - 37 U/L   ALT 77 (*) 0 - 53 U/L   Alkaline Phosphatase 119 (*) 39 - 117 U/L   Total Bilirubin 0.4  0.3 - 1.2 mg/dL   GFR calc non Af Amer 59 (*) >90 mL/min   GFR calc Af Amer 69 (*) >90 mL/min   Comment: (NOTE)     The eGFR has been calculated using the CKD EPI equation.     This calculation has not been validated in all clinical situations.     eGFR's persistently <90 mL/min signify possible Chronic Kidney     Disease.  CBC     Status: Abnormal   Collection Time    05/05/13  9:34 AM      Result Value Range   WBC 8.0  4.0 - 10.5 K/uL   RBC 4.94  4.22 - 5.81 MIL/uL   Hemoglobin 14.9  13.0 - 17.0 g/dL   HCT 04.5  40.9 - 81.1 %   MCV 90.7  78.0 - 100.0 fL   MCH 30.2  26.0 - 34.0 pg   MCHC 33.3  30.0 - 36.0 g/dL   RDW 91.4  78.2 - 95.6 %   Platelets 449 (*) 150 - 400 K/uL  TYPE AND SCREEN     Status: None   Collection Time    05/05/13  9:40 AM      Result Value Range   ABO/RH(D) O POS     Antibody Screen NEG     Sample Expiration 05/19/2013    ABO/RH     Status: None   Collection Time    05/05/13  9:40 AM      Result Value Range   ABO/RH(D) O POS    POCT I-STAT 4, (NA,K, GLUC, HGB,HCT)     Status: None   Collection Time    05/11/13  8:40 AM      Result Value Range   Sodium 142  135 - 145 mEq/L   Potassium 4.0  3.5 - 5.1 mEq/L   Glucose, Bld 92  70 - 99 mg/dL   HCT 21.3  08.6 - 57.8 %   Hemoglobin 15.0  13.0 - 17.0 g/dL  CULTURE, BLOOD (ROUTINE X 2)     Status: None   Collection Time    05/20/13  5:00 PM      Result Value Range   Specimen Description  BLOOD ARM RIGHT     Special Requests BOTTLES DRAWN AEROBIC ONLY 10CC     Culture  Setup Time       Value: 05/20/2013 21:59     Performed at [facility]   Culture       Value: NO GROWTH 5 DAYS     Performed at [facility]   Report Status 05/26/2013 FINAL    CBC WITH DIFFERENTIAL     Status: Abnormal   Collection Time    05/20/13  5:03  PM      Result Value Range   WBC 8.4  4.0 - 10.5 K/uL   RBC 4.27  4.22 - 5.81 MIL/uL   Hemoglobin 12.6 (*) 13.0 - 17.0 g/dL   HCT 16.1 (*) 09.6 - 04.5 %   MCV 86.7  78.0 - 100.0 fL   MCH 29.5  26.0 - 34.0 pg   MCHC 34.1  30.0 - 36.0 g/dL   RDW 40.9  81.1 - 91.4 %   Platelets 559 (*) 150 - 400 K/uL   Neutrophils Relative % 64  43 - 77 %   Neutro Abs 5.3  1.7 - 7.7 K/uL   Lymphocytes Relative 26  12 - 46 %   Lymphs Abs 2.2  0.7 - 4.0 K/uL   Monocytes Relative 8  3 - 12 %   Monocytes Absolute 0.6  0.1 - 1.0 K/uL   Eosinophils Relative 3  0 - 5 %   Eosinophils Absolute 0.3  0.0 - 0.7 K/uL   Basophils Relative 0  0 - 1 %   Basophils Absolute 0.0  0.0 - 0.1 K/uL  BASIC METABOLIC PANEL     Status: Abnormal   Collection Time    05/20/13  5:03 PM      Result Value Range   Sodium 141  135 - 145 mEq/L   Potassium 3.7  3.5 - 5.1 mEq/L   Chloride 106  96 - 112 mEq/L   CO2 22  19 - 32 mEq/L   Glucose, Bld 95  70 - 99 mg/dL   BUN 11  6 - 23 mg/dL   Creatinine, Ser 7.82  0.50 - 1.35 mg/dL   Calcium 8.9  8.4 - 95.6 mg/dL   GFR calc non Af Amer 78 (*) >90 mL/min   GFR calc Af Amer >90  >90 mL/min   Comment: (NOTE)     The eGFR has been calculated using the CKD EPI equation.     This calculation has not been validated in all clinical situations.     eGFR's persistently <90 mL/min signify possible Chronic Kidney     Disease.  CULTURE, BLOOD (ROUTINE X 2)     Status: None   Collection Time    05/20/13  5:03 PM      Result Value Range   Specimen Description BLOOD ARM LEFT     Special Requests BOTTLES DRAWN AEROBIC ONLY 10CC      Culture  Setup Time       Value: 05/20/2013 21:59     Performed at [facility]   Culture       Value: NO GROWTH 5 DAYS     Performed at [facility]   Report Status 05/26/2013 FINAL    GLUCOSE, CAPILLARY     Status: None   Collection Time    05/23/13 12:15 PM      Result Value Range   Glucose-Capillary 94  70 - 99 mg/dL   Comment 1 Notify RN     Comment 2 Documented in Chart    MRSA PCR SCREENING     Status: None   Collection Time    05/23/13 12:28 PM      Result Value Range   MRSA by PCR NEGATIVE  NEGATIVE   Comment:            The GeneXpert MRSA Assay (FDA     approved for NASAL specimens     only), is one component of a  comprehensive MRSA colonization     surveillance program. It is not     intended to diagnose MRSA     infection nor to guide or     monitor treatment for     MRSA infections.  CULTURE, BLOOD (ROUTINE X 2)     Status: None   Collection Time    06/01/13  7:25 PM      Result Value Range   Specimen Description BLOOD ARM RIGHT     Special Requests       Value: BOTTLES DRAWN AEROBIC AND ANAEROBIC 10CCBLUE 5CC RED   Culture  Setup Time       Value: 06/02/2013 02:45     Performed at [facility]   Culture       Value: NO GROWTH 5 DAYS     Performed at [facility]   Report Status 06/08/2013 FINAL    CBC WITH DIFFERENTIAL     Status: Abnormal   Collection Time    06/01/13  7:40 PM      Result Value Range   WBC 8.8  4.0 - 10.5 K/uL   RBC 3.79 (*) 4.22 - 5.81 MIL/uL   Hemoglobin 10.9 (*) 13.0 - 17.0 g/dL   HCT 16.1 (*) 09.6 - 04.5 %   MCV 86.5  78.0 - 100.0 fL   MCH 28.8  26.0 - 34.0 pg   MCHC 33.2  30.0 - 36.0 g/dL   RDW 40.9  81.1 - 91.4 %   Platelets 633 (*) 150 - 400 K/uL   Neutrophils Relative % 60  43 - 77 %   Neutro Abs 5.3  1.7 - 7.7 K/uL   Lymphocytes Relative 30  12 - 46 %   Lymphs Abs 2.7  0.7 - 4.0 K/uL   Monocytes Relative 8  3 - 12 %   Monocytes Absolute 0.7  0.1 - 1.0 K/uL   Eosinophils Relative  2  0 - 5 %   Eosinophils Absolute 0.1  0.0 - 0.7 K/uL   Basophils Relative 1  0 - 1 %   Basophils Absolute 0.0  0.0 - 0.1 K/uL  BASIC METABOLIC PANEL     Status: Abnormal   Collection Time    06/01/13  7:40 PM      Result Value Range   Sodium 139  135 - 145 mEq/L   Potassium 3.9  3.5 - 5.1 mEq/L   Chloride 106  96 - 112 mEq/L   CO2 23  19 - 32 mEq/L   Glucose, Bld 98  70 - 99 mg/dL   BUN 15  6 - 23 mg/dL   Creatinine, Ser 7.82  0.50 - 1.35 mg/dL   Calcium 9.2  8.4 - 95.6 mg/dL   GFR calc non Af Amer 67 (*) >90 mL/min   GFR calc Af Amer 78 (*) >90 mL/min   Comment: (NOTE)     The eGFR has been calculated using the CKD EPI equation.     This calculation has not been validated in all clinical situations.     eGFR's persistently <90 mL/min signify possible Chronic Kidney     Disease.  CULTURE, BLOOD (ROUTINE X 2)     Status: None   Collection Time    06/01/13  7:40 PM      Result Value Range   Specimen Description BLOOD ARM LEFT     Special Requests BOTTLES DRAWN AEROBIC AND ANAEROBIC 10CC     Culture  Setup Time  Value: 06/02/2013 02:45     Performed at [facility]   Culture       Value: NO GROWTH 5 DAYS     Performed at [facility]   Report Status 06/08/2013 FINAL    BASIC METABOLIC PANEL     Status: Abnormal   Collection Time    06/09/13  6:10 AM      Result Value Range   Sodium 134 (*) 135 - 145 mEq/L   Potassium 4.6  3.5 - 5.1 mEq/L   Chloride 101  96 - 112 mEq/L   CO2 27  19 - 32 mEq/L   Glucose, Bld 83  70 - 99 mg/dL   BUN 24 (*) 6 - 23 mg/dL   Creatinine, Ser 7.84  0.50 - 1.35 mg/dL   Calcium 9.0  8.4 - 69.6 mg/dL   GFR calc non Af Amer 55 (*) >90 mL/min   GFR calc Af Amer 64 (*) >90 mL/min   Comment: (NOTE)     The eGFR has been calculated using the CKD EPI equation.     This calculation has not been validated in all clinical situations.     eGFR's persistently <90 mL/min signify possible Chronic Kidney     Disease.  CBC WITH  DIFFERENTIAL     Status: Abnormal   Collection Time    06/09/13  6:10 AM      Result Value Range   WBC 9.8  4.0 - 10.5 K/uL   RBC 3.90 (*) 4.22 - 5.81 MIL/uL   Hemoglobin 11.4 (*) 13.0 - 17.0 g/dL   HCT 29.5 (*) 28.4 - 13.2 %   MCV 89.0  78.0 - 100.0 fL   MCH 29.2  26.0 - 34.0 pg   MCHC 32.9  30.0 - 36.0 g/dL   RDW 44.0  10.2 - 72.5 %   Platelets 429 (*) 150 - 400 K/uL   Neutrophils Relative % 50  43 - 77 %   Neutro Abs 4.9  1.7 - 7.7 K/uL   Lymphocytes Relative 29  12 - 46 %   Lymphs Abs 2.8  0.7 - 4.0 K/uL   Monocytes Relative 14 (*) 3 - 12 %   Monocytes Absolute 1.4 (*) 0.1 - 1.0 K/uL   Eosinophils Relative 7 (*) 0 - 5 %   Eosinophils Absolute 0.7  0.0 - 0.7 K/uL   Basophils Relative 0  0 - 1 %   Basophils Absolute 0.0  0.0 - 0.1 K/uL  URIC ACID     Status: None   Collection Time    07/16/13  2:26 PM      Result Value Range   Uric Acid, Serum 4.6  4.0 - 7.8 mg/dL  SEDIMENTATION RATE     Status: Abnormal   Collection Time    07/16/13  2:26 PM      Result Value Range   Sed Rate 30 (*) 0 - 16 mm/hr   Assessment/Plan: Polyarticular arthritis Will obtain CBC, ESR, uric acid level. Rx Medrol Dosepak. Avoid heavy lifting or over exertion.  If symptoms not improving with steroids and/or labs abnormal, will need followup for further labs.

## 2013-07-16 NOTE — Telephone Encounter (Signed)
Will be seen by cody,np in [place]

## 2013-07-16 NOTE — Patient Instructions (Signed)
Please take medicine, following the package directions.  You should avoid anti-inflammatory medications like Ibuprofen or Aleve while on steroids.  I will call you with all of your lab results and we will tailor treatment accordingly.  If symptoms wrosen or are not improving while on medication please return to our office or proceed to the ER.

## 2013-07-19 DIAGNOSIS — M130 Polyarthritis, unspecified: Secondary | ICD-10-CM | POA: Insufficient documentation

## 2013-07-19 NOTE — Assessment & Plan Note (Addendum)
Will obtain CBC, ESR, uric acid level. Rx Medrol Dosepak. Avoid heavy lifting or over exertion.  If symptoms not improving with steroids and/or labs abnormal, will need followup for further labs.

## 2013-07-21 ENCOUNTER — Emergency Department (HOSPITAL_COMMUNITY)
Admission: EM | Admit: 2013-07-21 | Discharge: 2013-07-21 | Disposition: A | Discharge status: Home / Self Care | Payer: Medicare Other | Attending: Emergency Medicine | Admitting: Emergency Medicine

## 2013-07-21 ENCOUNTER — Ambulatory Visit: Payer: Medicare Other | Admitting: Family Medicine

## 2013-07-21 ENCOUNTER — Telehealth: Payer: Self-pay | Admitting: *Deleted

## 2013-07-21 ENCOUNTER — Telehealth: Payer: Self-pay | Admitting: Physician Assistant

## 2013-07-21 ENCOUNTER — Encounter (HOSPITAL_COMMUNITY): Payer: Self-pay | Admitting: Emergency Medicine

## 2013-07-21 DIAGNOSIS — G8929 Other chronic pain: Secondary | ICD-10-CM | POA: Insufficient documentation

## 2013-07-21 DIAGNOSIS — Z85828 Personal history of other malignant neoplasm of skin: Secondary | ICD-10-CM | POA: Diagnosis not present

## 2013-07-21 DIAGNOSIS — M083 Juvenile rheumatoid polyarthritis (seronegative): Secondary | ICD-10-CM | POA: Diagnosis not present

## 2013-07-21 DIAGNOSIS — R509 Fever, unspecified: Secondary | ICD-10-CM | POA: Diagnosis not present

## 2013-07-21 DIAGNOSIS — Z85820 Personal history of malignant melanoma of skin: Secondary | ICD-10-CM | POA: Diagnosis not present

## 2013-07-21 DIAGNOSIS — Z79899 Other long term (current) drug therapy: Secondary | ICD-10-CM | POA: Diagnosis not present

## 2013-07-21 DIAGNOSIS — Z87448 Personal history of other diseases of urinary system: Secondary | ICD-10-CM | POA: Insufficient documentation

## 2013-07-21 DIAGNOSIS — Z8669 Personal history of other diseases of the nervous system and sense organs: Secondary | ICD-10-CM | POA: Insufficient documentation

## 2013-07-21 DIAGNOSIS — I2510 Atherosclerotic heart disease of native coronary artery without angina pectoris: Secondary | ICD-10-CM | POA: Diagnosis not present

## 2013-07-21 DIAGNOSIS — Z8709 Personal history of other diseases of the respiratory system: Secondary | ICD-10-CM | POA: Diagnosis not present

## 2013-07-21 DIAGNOSIS — Z87891 Personal history of nicotine dependence: Secondary | ICD-10-CM | POA: Insufficient documentation

## 2013-07-21 DIAGNOSIS — I10 Essential (primary) hypertension: Secondary | ICD-10-CM | POA: Insufficient documentation

## 2013-07-21 DIAGNOSIS — E785 Hyperlipidemia, unspecified: Secondary | ICD-10-CM | POA: Insufficient documentation

## 2013-07-21 DIAGNOSIS — M130 Polyarthritis, unspecified: Secondary | ICD-10-CM

## 2013-07-21 MED ORDER — PREDNISONE 20 MG PO TABS
60.0000 mg | ORAL_TABLET | Freq: Once | ORAL | Status: AC
  Administered 2013-07-21: 60 mg via ORAL
  Filled 2013-07-21: qty 3

## 2013-07-21 MED ORDER — PREDNISONE 20 MG PO TABS: ORAL_TABLET | ORAL | Status: DC

## 2013-07-21 MED ORDER — HYDROMORPHONE HCL PF 2 MG/ML IJ SOLN
2.0000 mg | Freq: Once | INTRAMUSCULAR | Status: AC
  Administered 2013-07-21: 2 mg via INTRAMUSCULAR
  Filled 2013-07-21: qty 1

## 2013-07-21 MED ORDER — ONDANSETRON 4 MG PO TBDP
4.0000 mg | ORAL_TABLET | Freq: Once | ORAL | Status: AC
  Administered 2013-07-21: 4 mg via ORAL
  Filled 2013-07-21: qty 1

## 2013-07-21 NOTE — ED Notes (Signed)
Pt reports seen a week ago and diagnosed with polyarthritic arthritis. Given prednisone, took rx, but now swelling and pain returned. Bilateral wrists red and swollen.

## 2013-07-21 NOTE — ED Provider Notes (Signed)
CSN: [id number]     Arrival date & time 07/21/13  1210 History  This chart was scribed for non-physician practitioner working with Derwood Kaplan, MD by Ashley Jacobs, ED scribe. This patient was seen in room TR10C/TR10C and the patient's care was started at 1:34 PM.   First MD Initiated Contact with Patient 07/21/13 1242     Chief Complaint  Patient presents with  . Hand Pain   (Consider location/radiation/quality/duration/timing/severity/associated sxs/prior Treatment) HPI HPI Comments: [name] is a 79 y.o. male who presents to the Emergency Department complaining of constant, severe right hand pain for the past two days that presents with swelling. Pt was seen by PCP on 12/4 for similar symptoms. He states having hx of polyarticular arthritis, carpal tunnel syndrome, gout, hyperlipidemia, HTN and CAD. He explains the medications that were prescribed improved pain temporarily but redness/pain/swelling returned upon discontinuation of steroids. In addition, pt has a "nerve" pain associated to his back that extends to his left foot. He has tried Percocet, and hot compresses for pain without relief. He denies fever and vomiting. He has mild nausea.   Previous work-up includes CBC (normal WBC 10/14), chemistries (normal kidney function 10/14), uric acid (normal 12/14), ESR (elevated 12/14).   Pt's PCP is Dr. Darryll Capers.   Past Medical History  Diagnosis Date  . ACTINIC KERATOSIS, HEAD 03/23/2008  . ALLERGIC RHINITIS 03/27/2007  . BENIGN PROSTATIC HYPERTROPHY, WITH URINARY OBSTRUCTION 10/16/2007  . CARPAL TUNNEL SYNDROME, LEFT 04/13/2008  . ERECTILE DYSFUNCTION, ORGANIC 02/20/2010  . Gout, unspecified 02/20/2010  . HYPERLIPIDEMIA, WITH HIGH HDL 10/18/2008  . KNEE PAIN 10/10/2009  . LOW BACK PAIN 03/27/2007  . NEOPLASM, MALIGNANT, SKIN, TRUNK 05/09/2007  . Patellar tendinitis 10/10/2009  . SKIN CANCER, HX OF 03/27/2007  . UNS ADVRS EFF UNS RX MEDICINAL&BIOLOGICAL SBSTNC 08/18/2007  .  HYPERTENSION 05/09/2007    sees Dr. Darryll Capers  . Headache(784.0)     migraines hx of  . Chronic neck pain   . Chronic back pain   . CAD 08/18/2007    "patient denies any issues with heart, does not see cardiologist"  . Urinary urgency   . History of melanoma   . Arthritis    Past Surgical History  Procedure Laterality Date  . Knee arthroscopy    . Cholecystectomy    . Nasal sinus surgery    . Vasectomy    . Lumbar fusions    . Joint replacement surgery    . Rectal fissure    . Lateral fusion lumbar spine, transverse    . Laminectomy    . Lumbar fusion    . Rectal fissure    . Vitrectomy      right and left 2013  . Carpal tunnel release    . Eye surgery      bilateral cataracts removed and vitrectomies  . Blepharoplasty    . Back surgery    . Posterior cervical fusion/foraminotomy Right 10/01/2012    Procedure: POSTERIOR CERVICAL FUSION/FORAMINOTOMY LEVEL 1;  Surgeon: Hewitt Shorts, MD;  Location: [facility];  Service: Neurosurgery;  Laterality: Right;  Right Cervical seven thoracic one Cervical laminectomy/foraminotomy with posterior cervical arthrodesis   . Joint replacement Right     knee  . Appendectomy  1969  . Lumbar laminectomy/decompression microdiscectomy Left 11/03/2012    Procedure: LUMBAR LAMINECTOMY/DECOMPRESSION MICRODISCECTOMY 1 LEVEL;  Surgeon: Hewitt Shorts, MD;  Location: [facility];  Service: Neurosurgery;  Laterality: Left;  LEFT L5S1 laminotomy foraminotomy and possible  microdiskectomy  . Melanoma excision    . Posterior lumbar fusion N/A 05/23/2013    Procedure: exploration of lumbar wound, explantation of left interbody implant.;  Surgeon: Hewitt Shorts, MD;  Location: [facility];  Service: Neurosurgery;  Laterality: N/A;   Family History  Problem Relation Age of Onset  . Dementia Mother   . Anemia Father   . Heart disease Father    History  Substance Use Topics  . Smoking status: Former Smoker -- 1.00 packs/day for 33 years    Quit  date: 09/29/1981  . Smokeless tobacco: Not on file  . Alcohol Use: 1.5 oz/week    3 drink(s) per week     Comment: daily    Review of Systems  Constitutional: Positive for fever. Negative for activity change.  HENT: Negative for rhinorrhea and sore throat.   Eyes: Negative for redness.  Respiratory: Negative for cough.   Cardiovascular: Negative for chest pain.  Gastrointestinal: Negative for nausea, vomiting, abdominal pain and diarrhea.  Genitourinary: Negative for dysuria.  Musculoskeletal: Positive for arthralgias. Negative for back pain, gait problem, joint swelling, myalgias and neck pain.  Skin: Negative for rash and wound.  Neurological: Negative for weakness, numbness and headaches.    Allergies  Gabapentin  Home Medications   Current Outpatient Rx  Name  Route  Sig  Dispense  Refill  . diltiazem (CARDIZEM CD) 300 MG 24 hr capsule   Oral   Take 300 mg by mouth daily.         Marland Kitchen Linaclotide (LINZESS) 145 MCG CAPS capsule   Oral   Take 1 capsule (145 mcg total) by mouth daily.   30 capsule      . magnesium hydroxide (MILK OF MAGNESIA) 800 MG/5ML suspension   Oral   Take 15 mLs by mouth every other day.         . oxyCODONE-acetaminophen (PERCOCET/ROXICET) 5-325 MG per tablet   Oral   Take 1-2 tablets by mouth every 4 (four) hours as needed for pain.   75 tablet   0   . predniSONE (DELTASONE) 20 MG tablet   Oral   Take 20 mg by mouth daily as needed.         . rosuvastatin (CRESTOR) 20 MG tablet   Oral   Take 20 mg by mouth once a week. On Friday         . telmisartan (MICARDIS) 80 MG tablet   Oral   Take 80 mg by mouth daily.          Marland Kitchen terazosin (HYTRIN) 1 MG capsule   Oral   Take 1 capsule (1 mg total) by mouth at bedtime.   30 capsule   1    BP 161/122  Pulse 93  Temp(Src) 98 F (36.7 C) (Oral)  Resp 20  SpO2 98% Physical Exam  Nursing note and vitals reviewed. Constitutional: He appears well-developed and well-nourished. No  distress.  HENT:  Head: Normocephalic and atraumatic.  Eyes: EOM are normal. Pupils are equal, round, and reactive to light.  Neck: Normal range of motion. Neck supple. No tracheal deviation present.  Cardiovascular: Normal rate.   Pulmonary/Chest: Effort normal. No respiratory distress.  Abdominal: Soft. He exhibits no distension.  Musculoskeletal: Normal range of motion. He exhibits edema and tenderness.  Swelling, erythema, and warmth to dorsum of hands, wrists bilaterally. Pain with movement. 2+ radial pulses. Distal sensation intact. No joint involvement lower extremities.   Neurological: He is alert.  Skin: Skin  is warm and dry.  Psychiatric: He has a normal mood and affect. His behavior is normal.    ED Course  Procedures (including critical care time) DIAGNOSTIC STUDIES: Oxygen Saturation is 98% on room air, normal by my interpretation.    COORDINATION OF CARE: 1:40 PM Discussed course of care with pt . Pt understands and agrees.  Labs Review Labs Reviewed - No data to display Imaging Review No results found.  EKG Interpretation   None      Vital signs reviewed and are as follows: Filed Vitals:   07/21/13 1216  BP: 161/122  Pulse: 93  Temp: 98 F (36.7 C)  Resp: 20   HTN noted.   Will treat with prolonged course of steroids. IM dilaudid for pain in ED, continue home pain meds.   He will need PCP f/u for further blood work, ?joint aspiration.   MDM   1. Polyarticular arthritis    No systemic symptoms of illness or signs of infection today. Highly doubt septic arthritis given bilateral symmetric nature, lack of other symptoms. ? Gout/pseudogout. ? Autoimmune/RA. Symptom control provided. Patient appears uncomfortable but well.   I personally performed the services described in this documentation, which was scribed in my presence. The recorded information has been reviewed and is accurate.     Renne Crigler, PA-C 07/21/13 [phone]

## 2013-07-21 NOTE — ED Notes (Signed)
Rt hand swelling and painful x 2 days states saw his family dr  On 12/4 and dx w/ polyarticular arthritis was given pred and it helped a little now it is back

## 2013-07-21 NOTE — Telephone Encounter (Signed)
Pt called stating Dr Lovell Sheehan office, told him to call us to follow up with [facility] about his wrist pain. I spoke with nurse and she advised to me to tell patient he that Selena Batten is not here today and  would need to follow up with PCP, I did call over to [facility] and let them know that Selena Batten is not here today and we do not have any appts available today, No drs here this afternoon, [facility] will contact pt

## 2013-07-21 NOTE — ED Notes (Signed)
Will hold post giving medications to evaluate for no adverse reaction, then discharge.

## 2013-07-21 NOTE — Telephone Encounter (Signed)
Opened in error

## 2013-07-21 NOTE — ED Provider Notes (Signed)
Medical screening examination/treatment/procedure(s) were performed by non-physician practitioner and as supervising physician I was immediately available for consultation/collaboration.  EKG Interpretation   None        Birgit Nowling, MD 07/21/13 1652 

## 2013-07-24 ENCOUNTER — Telehealth: Payer: Self-pay | Admitting: Physician Assistant

## 2013-07-24 NOTE — Telephone Encounter (Signed)
Left voicemail requesting pt to call office 

## 2013-07-24 NOTE — Telephone Encounter (Signed)
Pt has f/u with [facility] on 07/27/13 he said he just wants to keep that appt. and discuss a referral at that appt if they think he needs one

## 2013-07-24 NOTE — Telephone Encounter (Signed)
Please call patient and inform him that we received his visit note from the ER.  I am sorry that I was out of the office the day the patient called.  I would recommend that he come back in given that his symptoms flared back up after ending the prednisone.  His labs looked mostly good at last visit, except he had an elevated sed rate which is a marker for inflammation.  I would like for him to either see [place] or his PCP for further workup to exclude rheumatoid arthritis.  We can also refer him to a rheumatologist for a further workup if he prefers.  Either way he should be seen so we can get a better handle on his symptoms and try to prevent this from reoccurring.

## 2013-07-27 ENCOUNTER — Encounter: Payer: Self-pay | Admitting: Family

## 2013-07-27 ENCOUNTER — Ambulatory Visit (INDEPENDENT_AMBULATORY_CARE_PROVIDER_SITE_OTHER): Payer: Medicare Other | Admitting: Family

## 2013-07-27 VITALS — BP 142/70 | HR 96 | Wt 183.0 lb

## 2013-07-27 DIAGNOSIS — I10 Essential (primary) hypertension: Secondary | ICD-10-CM

## 2013-07-27 DIAGNOSIS — M130 Polyarthritis, unspecified: Secondary | ICD-10-CM

## 2013-07-27 LAB — CBC WITH DIFFERENTIAL/PLATELET
Basophils Absolute: 0 10*3/uL (ref 0.0–0.1)
Basophils Relative: 0.1 % (ref 0.0–3.0)
Eosinophils Absolute: 0 10*3/uL (ref 0.0–0.7)
Eosinophils Relative: 0.2 % (ref 0.0–5.0)
HCT: 36 % — ABNORMAL LOW (ref 39.0–52.0)
Hemoglobin: 11.6 g/dL — ABNORMAL LOW (ref 13.0–17.0)
Lymphocytes Relative: 7.8 % — ABNORMAL LOW (ref 12.0–46.0)
Lymphs Abs: 0.9 10*3/uL (ref 0.7–4.0)
MCHC: 32.3 g/dL (ref 30.0–36.0)
MCV: 85.2 fl (ref 78.0–100.0)
Monocytes Absolute: 0.1 10*3/uL (ref 0.1–1.0)
Monocytes Relative: 1.3 % — ABNORMAL LOW (ref 3.0–12.0)
Neutro Abs: 10.5 10*3/uL — ABNORMAL HIGH (ref 1.4–7.7)
Neutrophils Relative %: 90.6 % — ABNORMAL HIGH (ref 43.0–77.0)
Platelets: 681 10*3/uL — ABNORMAL HIGH (ref 150.0–400.0)
RBC: 4.23 Mil/uL (ref 4.22–5.81)
RDW: 14.5 % (ref 11.5–14.6)
WBC: 11.6 10*3/uL — ABNORMAL HIGH (ref 4.5–10.5)

## 2013-07-27 LAB — BASIC METABOLIC PANEL
BUN: 23 mg/dL (ref 6–23)
CO2: 26 mEq/L (ref 19–32)
Calcium: 9 mg/dL (ref 8.4–10.5)
Chloride: 103 mEq/L (ref 96–112)
Creatinine, Ser: 1 mg/dL (ref 0.4–1.5)
GFR: 76.43 mL/min (ref >60.00)
Glucose, Bld: 118 mg/dL — ABNORMAL HIGH (ref 70–99)
Potassium: 5.1 mEq/L (ref 3.5–5.1)
Sodium: 137 mEq/L (ref 135–145)

## 2013-07-27 NOTE — Patient Instructions (Signed)
Arthralgia  Your caregiver has diagnosed you as suffering from an arthralgia. Arthralgia means there is pain in a joint. This can come from many reasons including:  · Bruising the joint which causes soreness (inflammation) in the joint.  · Wear and tear on the joints which occur as we grow older (osteoarthritis).  · Overusing the joint.  · Various forms of arthritis.  · Infections of the joint.  Regardless of the cause of pain in your joint, most of these different pains respond to anti-inflammatory drugs and rest. The exception to this is when a joint is infected, and these cases are treated with antibiotics, if it is a bacterial infection.  HOME CARE INSTRUCTIONS   · Rest the injured area for as long as directed by your caregiver. Then slowly start using the joint as directed by your caregiver and as the pain allows. Crutches as directed may be useful if the ankles, knees or hips are involved. If the knee was splinted or casted, continue use and care as directed. If an stretchy or elastic wrapping bandage has been applied today, it should be removed and re-applied every 3 to 4 hours. It should not be applied tightly, but firmly enough to keep swelling down. Watch toes and feet for swelling, bluish discoloration, coldness, numbness or excessive pain. If any of these problems (symptoms) occur, remove the ace bandage and re-apply more loosely. If these symptoms persist, contact your caregiver or return to this location.  · For the first 24 hours, keep the injured extremity elevated on pillows while lying down.  · Apply ice for 15-20 minutes to the sore joint every couple hours while awake for the first half day. Then 03-04 times per day for the first 48 hours. Put the ice in a plastic bag and place a towel between the bag of ice and your skin.  · Wear any splinting, casting, elastic bandage applications, or slings as instructed.  · Only take over-the-counter or prescription medicines for pain, discomfort, or fever as  directed by your caregiver. Do not use aspirin immediately after the injury unless instructed by your physician. Aspirin can cause increased bleeding and bruising of the tissues.  · If you were given crutches, continue to use them as instructed and do not resume weight bearing on the sore joint until instructed.  Persistent pain and inability to use the sore joint as directed for more than 2 to 3 days are warning signs indicating that you should see a caregiver for a follow-up visit as soon as possible. Initially, a hairline fracture (break in bone) may not be evident on X-rays. Persistent pain and swelling indicate that further evaluation, non-weight bearing or use of the joint (use of crutches or slings as instructed), or further X-rays are indicated. X-rays may sometimes not show a small fracture until a week or 10 days later. Make a follow-up appointment with your own caregiver or one to whom we have referred you. A radiologist (specialist in reading X-rays) may read your X-rays. Make sure you know how you are to obtain your X-ray results. Do not assume everything is normal if you do not hear from us.  SEEK MEDICAL CARE IF:  Bruising, swelling, or pain increases.  SEEK IMMEDIATE MEDICAL CARE IF:   · Your fingers or toes are numb or blue.  · The pain is not responding to medications and continues to stay the same or get worse.  · The pain in your joint becomes severe.  · You   develop a fever over 102° F (38.9° C).  · It becomes impossible to move or use the joint.  MAKE SURE YOU:   · Understand these instructions.  · Will watch your condition.  · Will get help right away if you are not doing well or get worse.  Document Released: 07/30/2005 Document Revised: 10/22/2011 Document Reviewed: 03/17/2008  ExitCare® Patient Information ©2014 [employer].

## 2013-07-27 NOTE — Progress Notes (Signed)
Subjective:    Patient ID: [name], male    DOB: 16-Jul-1932, 79 y.o.   MRN: [id number]  HPI  79 year old caucasian male, nonsmoker, presenting to office for follow-up after emergency room visit on 12/9. On 07/20/13, patient was seen by PA in [facility] who diagnosed polyarthritis and was prescribed prednisone.  The next day, patient woke up to worsening, excruciating pain.  He was unable to use his hands at all.  His wife had to bath and help him get dressed to come to emergency room.  Patient was placed on higher prednisone taper.  He has three days of one tab, and three days of half tablet left.   Review of Systems  Constitutional: Negative.   HENT: Negative.   Eyes: Negative.   Respiratory: Negative.   Cardiovascular: Negative.   Gastrointestinal: Negative.   Endocrine: Negative.   Genitourinary: Negative.   Musculoskeletal: Positive for arthralgias and back pain.       Patient has follow-up for joint pain and has history and back pain.  Skin: Negative.   Allergic/Immunologic: Negative.   Neurological: Negative.   Hematological: Negative.   Psychiatric/Behavioral: Negative.    Past Medical History  Diagnosis Date  . ACTINIC KERATOSIS, HEAD 03/23/2008  . ALLERGIC RHINITIS 03/27/2007  . BENIGN PROSTATIC HYPERTROPHY, WITH URINARY OBSTRUCTION 10/16/2007  . CARPAL TUNNEL SYNDROME, LEFT 04/13/2008  . ERECTILE DYSFUNCTION, ORGANIC 02/20/2010  . Gout, unspecified 02/20/2010  . HYPERLIPIDEMIA, WITH HIGH HDL 10/18/2008  . KNEE PAIN 10/10/2009  . LOW BACK PAIN 03/27/2007  . NEOPLASM, MALIGNANT, SKIN, TRUNK 05/09/2007  . Patellar tendinitis 10/10/2009  . SKIN CANCER, HX OF 03/27/2007  . UNS ADVRS EFF UNS RX MEDICINAL&BIOLOGICAL SBSTNC 08/18/2007  . HYPERTENSION 05/09/2007    sees Dr. Darryll Capers  . Headache(784.0)     migraines hx of  . Chronic neck pain   . Chronic back pain   . CAD 08/18/2007    "patient denies any issues with heart, does not see cardiologist"  . Urinary urgency   .  History of melanoma   . Arthritis     History   Social History  . Marital Status: Married    Spouse Name: N/A    Number of Children: N/A  . Years of Education: N/A   Occupational History  . Not on file.   Social History Main Topics  . Smoking status: Former Smoker -- 1.00 packs/day for 33 years    Quit date: 09/29/1981  . Smokeless tobacco: Not on file  . Alcohol Use: 1.5 oz/week    3 drink(s) per week     Comment: daily  . Drug Use: No  . Sexual Activity: Yes   Other Topics Concern  . Not on file   Social History Narrative  . No narrative on file    Past Surgical History  Procedure Laterality Date  . Knee arthroscopy    . Cholecystectomy    . Nasal sinus surgery    . Vasectomy    . Lumbar fusions    . Joint replacement surgery    . Rectal fissure    . Lateral fusion lumbar spine, transverse    . Laminectomy    . Lumbar fusion    . Rectal fissure    . Vitrectomy      right and left 2013  . Carpal tunnel release    . Eye surgery      bilateral cataracts removed and vitrectomies  . Blepharoplasty    .  Back surgery    . Posterior cervical fusion/foraminotomy Right 10/01/2012    Procedure: POSTERIOR CERVICAL FUSION/FORAMINOTOMY LEVEL 1;  Surgeon: Hewitt Shorts, MD;  Location: [facility];  Service: Neurosurgery;  Laterality: Right;  Right Cervical seven thoracic one Cervical laminectomy/foraminotomy with posterior cervical arthrodesis   . Joint replacement Right     knee  . Appendectomy  1969  . Lumbar laminectomy/decompression microdiscectomy Left 11/03/2012    Procedure: LUMBAR LAMINECTOMY/DECOMPRESSION MICRODISCECTOMY 1 LEVEL;  Surgeon: Hewitt Shorts, MD;  Location: [facility];  Service: Neurosurgery;  Laterality: Left;  LEFT L5S1 laminotomy foraminotomy and possible microdiskectomy  . Melanoma excision    . Posterior lumbar fusion N/A 05/23/2013    Procedure: exploration of lumbar wound, explantation of left interbody implant.;  Surgeon: Hewitt Shorts, MD;  Location: [facility];  Service: Neurosurgery;  Laterality: N/A;    Family History  Problem Relation Age of Onset  . Dementia Mother   . Anemia Father   . Heart disease Father     Allergies  Allergen Reactions  . Gabapentin     Lower extremity edema    Current Outpatient Prescriptions on File Prior to Visit  Medication Sig Dispense Refill  . diltiazem (CARDIZEM CD) 300 MG 24 hr capsule Take 300 mg by mouth daily.      . Linaclotide (LINZESS) 145 MCG CAPS capsule Take 1 capsule (145 mcg total) by mouth daily.  30 capsule    . magnesium hydroxide (MILK OF MAGNESIA) 800 MG/5ML suspension Take 15 mLs by mouth every other day.      . oxyCODONE-acetaminophen (PERCOCET/ROXICET) 5-325 MG per tablet Take 1-2 tablets by mouth every 4 (four) hours as needed for pain.  75 tablet  0  . predniSONE (DELTASONE) 20 MG tablet 3 Tabs PO Days 1-3, then 2 tabs PO Days 4-6, then 1 tab PO Day 7-9, then Half Tab PO Day 10-12  20 tablet  0  . rosuvastatin (CRESTOR) 20 MG tablet Take 20 mg by mouth once a week. On Friday      . telmisartan (MICARDIS) 80 MG tablet Take 80 mg by mouth daily.       Marland Kitchen terazosin (HYTRIN) 1 MG capsule Take 1 capsule (1 mg total) by mouth at bedtime.  30 capsule  1   No current facility-administered medications on file prior to visit.    BP 142/70  Pulse 96  Wt 183 lb (83.008 kg)chart    Objective:   Physical Exam  Constitutional: He is oriented to person, place, and time. He appears well-developed and well-nourished.  Neck: Normal range of motion. Neck supple.  Cardiovascular: Normal rate and regular rhythm.   Pulmonary/Chest: Effort normal and breath sounds normal.  Musculoskeletal: Normal range of motion.  Mild Right and left hand swollen noted around the joint spaces of the hands.  No redness or pain on palpation  Neurological: He is alert and oriented to person, place, and time.  Skin: Skin is warm and dry.  Psychiatric: He has a normal mood and affect.           Assessment & Plan:  Assessment 1. Polyarticular arthritis 2. Hypertension  Plan: Continue current prednisone taper.  Obtain labs that include CBC, CMP, and RA factor.  Follow-up with patients after results available.  Contact office if problems persist or worsen.

## 2013-07-27 NOTE — Progress Notes (Signed)
Pre visit review using our clinic review tool, if applicable. No additional management support is needed unless otherwise documented below in the visit note. 

## 2013-07-28 LAB — RHEUMATOID FACTOR: Rhuematoid fact SerPl-aCnc: <10 IU/mL (ref <=14)

## 2013-08-03 ENCOUNTER — Encounter: Payer: Self-pay | Admitting: Internal Medicine

## 2013-08-04 ENCOUNTER — Encounter: Payer: Self-pay | Admitting: Family

## 2013-08-04 ENCOUNTER — Ambulatory Visit (INDEPENDENT_AMBULATORY_CARE_PROVIDER_SITE_OTHER): Payer: Medicare Other | Admitting: Family

## 2013-08-04 VITALS — BP 158/60 | HR 124 | Temp 102.0°F | Wt 186.0 lb

## 2013-08-04 DIAGNOSIS — M130 Polyarthritis, unspecified: Secondary | ICD-10-CM | POA: Diagnosis not present

## 2013-08-04 DIAGNOSIS — M109 Gout, unspecified: Secondary | ICD-10-CM | POA: Diagnosis not present

## 2013-08-04 MED ORDER — PREDNISONE 20 MG PO TABS: ORAL_TABLET | ORAL | Status: DC

## 2013-08-04 NOTE — Patient Instructions (Signed)
Arthralgia °Your caregiver has diagnosed you as suffering from an arthralgia. Arthralgia means there is pain in a joint. This can come from many reasons including: °· Bruising the joint which causes soreness (inflammation) in the joint. °· Wear and tear on the joints which occur as we grow older (osteoarthritis). °· Overusing the joint. °· Various forms of arthritis. °· Infections of the joint. °Regardless of the cause of pain in your joint, most of these different pains respond to anti-inflammatory drugs and rest. The exception to this is when a joint is infected, and these cases are treated with antibiotics, if it is a bacterial infection. °HOME CARE INSTRUCTIONS  °· Rest the injured area for as long as directed by your caregiver. Then slowly start using the joint as directed by your caregiver and as the pain allows. Crutches as directed may be useful if the ankles, knees or hips are involved. If the knee was splinted or casted, continue use and care as directed. If an stretchy or elastic wrapping bandage has been applied today, it should be removed and re-applied every 3 to 4 hours. It should not be applied tightly, but firmly enough to keep swelling down. Watch toes and feet for swelling, bluish discoloration, coldness, numbness or excessive pain. If any of these problems (symptoms) occur, remove the ace bandage and re-apply more loosely. If these symptoms persist, contact your caregiver or return to this location. °· For the first 24 hours, keep the injured extremity elevated on pillows while lying down. °· Apply ice for 15-20 minutes to the sore joint every couple hours while awake for the first half day. Then 03-04 times per day for the first 48 hours. Put the ice in a plastic bag and place a towel between the bag of ice and your skin. °· Wear any splinting, casting, elastic bandage applications, or slings as instructed. °· Only take over-the-counter or prescription medicines for pain, discomfort, or fever as  directed by your caregiver. Do not use aspirin immediately after the injury unless instructed by your physician. Aspirin can cause increased bleeding and bruising of the tissues. °· If you were given crutches, continue to use them as instructed and do not resume weight bearing on the sore joint until instructed. °Persistent pain and inability to use the sore joint as directed for more than 2 to 3 days are warning signs indicating that you should see a caregiver for a follow-up visit as soon as possible. Initially, a hairline fracture (break in bone) may not be evident on X-rays. Persistent pain and swelling indicate that further evaluation, non-weight bearing or use of the joint (use of crutches or slings as instructed), or further X-rays are indicated. X-rays may sometimes not show a small fracture until a week or 10 days later. Make a follow-up appointment with your own caregiver or one to whom we have referred you. A radiologist (specialist in reading X-rays) may read your X-rays. Make sure you know how you are to obtain your X-ray results. Do not assume everything is normal if you do not hear from us. °SEEK MEDICAL CARE IF: °Bruising, swelling, or pain increases. °SEEK IMMEDIATE MEDICAL CARE IF:  °· Your fingers or toes are numb or blue. °· The pain is not responding to medications and continues to stay the same or get worse. °· The pain in your joint becomes severe. °· You develop a fever over 102° F (38.9° C). °· It becomes impossible to move or use the joint. °MAKE SURE YOU:  °·   Understand these instructions. °· Will watch your condition. °· Will get help right away if you are not doing well or get worse. °Document Released: 07/30/2005 Document Revised: 10/22/2011 Document Reviewed: 03/17/2008 °ExitCare® Patient Information ©2014 [employer]. °Gout °Gout is an inflammatory arthritis caused by a buildup of uric acid crystals in the joints. Uric acid is a chemical that is normally present in the blood. When  the level of uric acid in the blood is too high it can form crystals that deposit in your joints and tissues. This causes joint redness, soreness, and swelling (inflammation). Repeat attacks are common. Over time, uric acid crystals can form into masses (tophi) near a joint, destroying bone and causing disfigurement. Gout is treatable and often preventable. °CAUSES  °The disease begins with elevated levels of uric acid in the blood. Uric acid is produced by your body when it breaks down a naturally found substance called purines. Certain foods you eat, such as meats and fish, contain high amounts of purines. Causes of an elevated uric acid level include: °· Being passed down from parent to child (heredity). °· Diseases that cause increased uric acid production (such as obesity, psoriasis, and certain cancers). °· Excessive alcohol use. °· Diet, especially diets rich in meat and seafood. °· Medicines, including certain cancer-fighting medicines (chemotherapy), water pills (diuretics), and aspirin. °· Chronic kidney disease. The kidneys are no longer able to remove uric acid well. °· Problems with metabolism. °Conditions strongly associated with gout include: °· Obesity. °· High blood pressure. °· High cholesterol. °· Diabetes. °Not everyone with elevated uric acid levels gets gout. It is not understood why some people get gout and others do not. Surgery, joint injury, and eating too much of certain foods are some of the factors that can lead to gout attacks. °SYMPTOMS  °· An attack of gout comes on quickly. It causes intense pain with redness, swelling, and warmth in a joint. °· Fever can occur. °· Often, only one joint is involved. Certain joints are more commonly involved: °· Base of the big toe. °· Knee. °· Ankle. °· Wrist. °· Finger. °Without treatment, an attack usually goes away in a few days to weeks. Between attacks, you usually will not have symptoms, which is different from many other forms of  arthritis. °DIAGNOSIS  °Your caregiver will suspect gout based on your symptoms and exam. In some cases, tests may be recommended. The tests may include: °· Blood tests. °· Urine tests. °· X-rays. °· Joint fluid exam. This exam requires a needle to remove fluid from the joint (arthrocentesis). Using a microscope, gout is confirmed when uric acid crystals are seen in the joint fluid. °TREATMENT  °There are two phases to gout treatment: treating the sudden onset (acute) attack and preventing attacks (prophylaxis). °· Treatment of an Acute Attack. °· Medicines are used. These include anti-inflammatory medicines or steroid medicines. °· An injection of steroid medicine into the affected joint is sometimes necessary. °· The painful joint is rested. Movement can worsen the arthritis. °· You may use warm or cold treatments on painful joints, depending which works best for you. °· Treatment to Prevent Attacks. °· If you suffer from frequent gout attacks, your caregiver may advise preventive medicine. These medicines are started after the acute attack subsides. These medicines either help your kidneys eliminate uric acid from your body or decrease your uric acid production. You may need to stay on these medicines for a very long time. °· The early phase of treatment with preventive   medicine can be associated with an increase in acute gout attacks. For this reason, during the first few months of treatment, your caregiver may also advise you to take medicines usually used for acute gout treatment. Be sure you understand your caregiver's directions. Your caregiver may make several adjustments to your medicine dose before these medicines are effective. °· Discuss dietary treatment with your caregiver or dietitian. Alcohol and drinks high in sugar and fructose and foods such as meat, poultry, and seafood can increase uric acid levels. Your caregiver or dietician can advise you on drinks and foods that should be limited. °HOME  CARE INSTRUCTIONS  °· Do not take aspirin to relieve pain. This raises uric acid levels. °· Only take over-the-counter or prescription medicines for pain, discomfort, or fever as directed by your caregiver. °· Rest the joint as much as possible. When in bed, keep sheets and blankets off painful areas. °· Keep the affected joint raised (elevated). °· Apply warm or cold treatments to painful joints. Use of warm or cold treatments depends on which works best for you. °· Use crutches if the painful joint is in your leg. °· Drink enough fluids to keep your urine clear or pale yellow. This helps your body get rid of uric acid. Limit alcohol, sugary drinks, and fructose drinks. °· Follow your dietary instructions. Pay careful attention to the amount of protein you eat. Your daily diet should emphasize fruits, vegetables, whole grains, and fat-free or low-fat milk products. Discuss the use of coffee, vitamin C, and cherries with your caregiver or dietician. These may be helpful in lowering uric acid levels. °· Maintain a healthy body weight. °SEEK MEDICAL CARE IF:  °· You develop diarrhea, vomiting, or any side effects from medicines. °· You do not feel better in 24 hours, or you are getting worse. °SEEK IMMEDIATE MEDICAL CARE IF:  °· Your joint becomes suddenly more tender, and you have chills or a fever. °MAKE SURE YOU:  °· Understand these instructions. °· Will watch your condition. °· Will get help right away if you are not doing well or get worse. °Document Released: 07/27/2000 Document Revised: 11/24/2012 Document Reviewed: 03/12/2012 °ExitCare® Patient Information ©2014 [employer]. ° °

## 2013-08-04 NOTE — Progress Notes (Signed)
Pre visit review using our clinic review tool, if applicable. No additional management support is needed unless otherwise documented below in the visit note. 

## 2013-08-04 NOTE — Progress Notes (Signed)
Subjective:    Patient ID: [name], male    DOB: 12-08-1931, 79 y.o.   MRN: [id number]  HPI 79 year old white male, nonsmoker, patient of Dr. Lovell Sheehan is in today with another flare of polyarthritis. He has a history of gout. He was initially seen by physician assistant on 07/16/2013 and diagnosed with polyarthritis. He was no better and went to the emergency department on 1214 and was put on prednisone. The prednisone significantly helped his symptoms. Patient reports 4 days ago having 3 rum and Coke. At that point, he was at 10 mg of prednisone. Today, he is back because he has a red, swollen right wrist, left elbow and and bilateral swelling in his feet. He's been febrile to 102. He has had ESR that was abnormal in the emergency department. RA factor negative. CBC and BMP stable. He denies any sneezing, cough, congestion, urinary frequency, urgency.   Review of Systems  Constitutional: Positive for fever. Negative for fatigue.  HENT: Negative.   Respiratory: Negative.  Negative for cough, shortness of breath and wheezing.   Cardiovascular: Positive for leg swelling. Negative for chest pain and palpitations.  Gastrointestinal: Negative.   Genitourinary: Negative.   Musculoskeletal: Positive for arthralgias and neck stiffness. Negative for myalgias.  Skin: Negative.   Allergic/Immunologic: Negative.   Neurological: Negative.   Psychiatric/Behavioral: Negative.    Past Medical History  Diagnosis Date  . ACTINIC KERATOSIS, HEAD 03/23/2008  . ALLERGIC RHINITIS 03/27/2007  . BENIGN PROSTATIC HYPERTROPHY, WITH URINARY OBSTRUCTION 10/16/2007  . CARPAL TUNNEL SYNDROME, LEFT 04/13/2008  . ERECTILE DYSFUNCTION, ORGANIC 02/20/2010  . Gout, unspecified 02/20/2010  . HYPERLIPIDEMIA, WITH HIGH HDL 10/18/2008  . KNEE PAIN 10/10/2009  . LOW BACK PAIN 03/27/2007  . NEOPLASM, MALIGNANT, SKIN, TRUNK 05/09/2007  . Patellar tendinitis 10/10/2009  . SKIN CANCER, HX OF 03/27/2007  . UNS ADVRS EFF UNS RX  MEDICINAL&BIOLOGICAL SBSTNC 08/18/2007  . HYPERTENSION 05/09/2007    sees Dr. Darryll Capers  . Headache(784.0)     migraines hx of  . Chronic neck pain   . Chronic back pain   . CAD 08/18/2007    "patient denies any issues with heart, does not see cardiologist"  . Urinary urgency   . History of melanoma   . Arthritis     History   Social History  . Marital Status: Married    Spouse Name: N/A    Number of Children: N/A  . Years of Education: N/A   Occupational History  . Not on file.   Social History Main Topics  . Smoking status: Former Smoker -- 1.00 packs/day for 33 years    Quit date: 09/29/1981  . Smokeless tobacco: Not on file  . Alcohol Use: 1.5 oz/week    3 drink(s) per week     Comment: daily  . Drug Use: No  . Sexual Activity: Yes   Other Topics Concern  . Not on file   Social History Narrative  . No narrative on file    Past Surgical History  Procedure Laterality Date  . Knee arthroscopy    . Cholecystectomy    . Nasal sinus surgery    . Vasectomy    . Lumbar fusions    . Joint replacement surgery    . Rectal fissure    . Lateral fusion lumbar spine, transverse    . Laminectomy    . Lumbar fusion    . Rectal fissure    . Vitrectomy      right  and left 2013  . Carpal tunnel release    . Eye surgery      bilateral cataracts removed and vitrectomies  . Blepharoplasty    . Back surgery    . Posterior cervical fusion/foraminotomy Right 10/01/2012    Procedure: POSTERIOR CERVICAL FUSION/FORAMINOTOMY LEVEL 1;  Surgeon: Hewitt Shorts, MD;  Location: [facility];  Service: Neurosurgery;  Laterality: Right;  Right Cervical seven thoracic one Cervical laminectomy/foraminotomy with posterior cervical arthrodesis   . Joint replacement Right     knee  . Appendectomy  1969  . Lumbar laminectomy/decompression microdiscectomy Left 11/03/2012    Procedure: LUMBAR LAMINECTOMY/DECOMPRESSION MICRODISCECTOMY 1 LEVEL;  Surgeon: Hewitt Shorts, MD;  Location: [facility];  Service: Neurosurgery;  Laterality: Left;  LEFT L5S1 laminotomy foraminotomy and possible microdiskectomy  . Melanoma excision    . Posterior lumbar fusion N/A 05/23/2013    Procedure: exploration of lumbar wound, explantation of left interbody implant.;  Surgeon: Hewitt Shorts, MD;  Location: [facility];  Service: Neurosurgery;  Laterality: N/A;    Family History  Problem Relation Age of Onset  . Dementia Mother   . Anemia Father   . Heart disease Father     Allergies  Allergen Reactions  . Gabapentin     Lower extremity edema    Current Outpatient Prescriptions on File Prior to Visit  Medication Sig Dispense Refill  . diltiazem (CARDIZEM CD) 300 MG 24 hr capsule Take 300 mg by mouth daily.      . Linaclotide (LINZESS) 145 MCG CAPS capsule Take 1 capsule (145 mcg total) by mouth daily.  30 capsule    . magnesium hydroxide (MILK OF MAGNESIA) 800 MG/5ML suspension Take 15 mLs by mouth every other day.      . oxyCODONE-acetaminophen (PERCOCET/ROXICET) 5-325 MG per tablet Take 1-2 tablets by mouth every 4 (four) hours as needed for pain.  75 tablet  0  . rosuvastatin (CRESTOR) 20 MG tablet Take 20 mg by mouth once a week. On Friday      . telmisartan (MICARDIS) 80 MG tablet Take 80 mg by mouth daily.       Marland Kitchen terazosin (HYTRIN) 1 MG capsule Take 1 capsule (1 mg total) by mouth at bedtime.  30 capsule  1   No current facility-administered medications on file prior to visit.    BP 158/60  Pulse 124  Temp(Src) 102 F (38.9 C) (Oral)  Wt 186 lb (84.369 kg)chart    Objective:   Physical Exam  Constitutional: He is oriented to person, place, and time. He appears well-developed and well-nourished.  Neck: Normal range of motion. Neck supple.  Cardiovascular: Normal rate, regular rhythm and normal heart sounds.   Bilateral peripheral edema.  Pulmonary/Chest: Effort normal and breath sounds normal.  Abdominal: Soft. Bowel sounds are normal.  Musculoskeletal: He exhibits  edema and tenderness.   Swelling and erythema noted to the right wrist, right elbow  Neurological: He is alert and oriented to person, place, and time.  Skin: Skin is warm and dry.  Psychiatric: He has a normal mood and affect.          Assessment & Plan:  Assessment: 1. Polyarthritis 2. Gout  Plan: Consult with Dr. Caryl Never who also saw this patient. Refill that it is a continuation of polyarthritis exacerbated by the increase in alcohol intake. We have decided to initiate prednisone and taper slowly over the next 10 days. Referred to rheumatology since this has been difficult to manage.  I will recheck patient tomorrow to be sure his progress in the correct direction.

## 2013-08-05 ENCOUNTER — Ambulatory Visit: Payer: Medicare Other | Admitting: Family

## 2013-08-20 ENCOUNTER — Encounter: Payer: Self-pay | Admitting: Internal Medicine

## 2013-08-20 DIAGNOSIS — K219 Gastro-esophageal reflux disease without esophagitis: Secondary | ICD-10-CM

## 2013-08-21 MED ORDER — FAMOTIDINE 40 MG PO TABS: 40.0000 mg | ORAL_TABLET | Freq: Two times a day (BID) | ORAL | Status: DC

## 2013-08-24 DIAGNOSIS — L259 Unspecified contact dermatitis, unspecified cause: Secondary | ICD-10-CM | POA: Diagnosis not present

## 2013-08-24 DIAGNOSIS — Z85828 Personal history of other malignant neoplasm of skin: Secondary | ICD-10-CM | POA: Diagnosis not present

## 2013-08-24 DIAGNOSIS — M109 Gout, unspecified: Secondary | ICD-10-CM | POA: Diagnosis not present

## 2013-08-24 DIAGNOSIS — M25529 Pain in unspecified elbow: Secondary | ICD-10-CM | POA: Diagnosis not present

## 2013-08-24 DIAGNOSIS — L570 Actinic keratosis: Secondary | ICD-10-CM | POA: Diagnosis not present

## 2013-08-24 DIAGNOSIS — M19049 Primary osteoarthritis, unspecified hand: Secondary | ICD-10-CM | POA: Diagnosis not present

## 2013-08-24 DIAGNOSIS — Z79899 Other long term (current) drug therapy: Secondary | ICD-10-CM | POA: Diagnosis not present

## 2013-08-31 ENCOUNTER — Encounter: Payer: Self-pay | Admitting: Internal Medicine

## 2013-08-31 DIAGNOSIS — I10 Essential (primary) hypertension: Secondary | ICD-10-CM

## 2013-08-31 MED ORDER — HYDROCHLOROTHIAZIDE 12.5 MG PO CAPS: 12.5000 mg | ORAL_CAPSULE | Freq: Every day | ORAL | Status: DC

## 2013-09-01 ENCOUNTER — Encounter: Payer: Self-pay | Admitting: Internal Medicine

## 2013-09-02 DIAGNOSIS — Q762 Congenital spondylolisthesis: Secondary | ICD-10-CM | POA: Insufficient documentation

## 2013-09-02 DIAGNOSIS — IMO0002 Reserved for concepts with insufficient information to code with codable children: Secondary | ICD-10-CM | POA: Diagnosis not present

## 2013-09-02 DIAGNOSIS — M47817 Spondylosis without myelopathy or radiculopathy, lumbosacral region: Secondary | ICD-10-CM | POA: Diagnosis not present

## 2013-09-02 DIAGNOSIS — E669 Obesity, unspecified: Secondary | ICD-10-CM | POA: Diagnosis not present

## 2013-09-17 ENCOUNTER — Other Ambulatory Visit: Payer: Self-pay | Admitting: Neurosurgery

## 2013-09-17 DIAGNOSIS — M5416 Radiculopathy, lumbar region: Secondary | ICD-10-CM

## 2013-09-18 ENCOUNTER — Other Ambulatory Visit: Payer: Self-pay | Admitting: Neurosurgery

## 2013-09-18 ENCOUNTER — Ambulatory Visit
Admission: RE | Admit: 2013-09-18 | Discharge: 2013-09-18 | Disposition: A | Discharge status: Home / Self Care | Payer: Medicare Other | Source: Ambulatory Visit | Attending: Neurosurgery | Admitting: Neurosurgery

## 2013-09-18 DIAGNOSIS — Q762 Congenital spondylolisthesis: Secondary | ICD-10-CM

## 2013-09-18 DIAGNOSIS — IMO0002 Reserved for concepts with insufficient information to code with codable children: Secondary | ICD-10-CM | POA: Diagnosis not present

## 2013-09-18 DIAGNOSIS — M47817 Spondylosis without myelopathy or radiculopathy, lumbosacral region: Secondary | ICD-10-CM | POA: Diagnosis not present

## 2013-09-21 ENCOUNTER — Ambulatory Visit
Admission: RE | Admit: 2013-09-21 | Discharge: 2013-09-21 | Disposition: A | Discharge status: Home / Self Care | Payer: Medicare Other | Source: Ambulatory Visit | Attending: Neurosurgery | Admitting: Neurosurgery

## 2013-09-21 DIAGNOSIS — M539 Dorsopathy, unspecified: Secondary | ICD-10-CM | POA: Diagnosis not present

## 2013-09-21 DIAGNOSIS — M5416 Radiculopathy, lumbar region: Secondary | ICD-10-CM

## 2013-09-21 MED ORDER — GADOBENATE DIMEGLUMINE 529 MG/ML IV SOLN
17.0000 mL | Freq: Once | INTRAVENOUS | Status: AC | PRN
  Administered 2013-09-21: 17 mL via INTRAVENOUS

## 2013-09-23 DIAGNOSIS — S3210XA Unspecified fracture of sacrum, initial encounter for closed fracture: Secondary | ICD-10-CM | POA: Insufficient documentation

## 2013-09-23 DIAGNOSIS — IMO0002 Reserved for concepts with insufficient information to code with codable children: Secondary | ICD-10-CM | POA: Diagnosis not present

## 2013-09-23 DIAGNOSIS — T85890A Other specified complication of nervous system prosthetic devices, implants and grafts, initial encounter: Secondary | ICD-10-CM | POA: Diagnosis not present

## 2013-09-23 DIAGNOSIS — S322XXA Fracture of coccyx, initial encounter for closed fracture: Secondary | ICD-10-CM | POA: Diagnosis not present

## 2013-09-23 DIAGNOSIS — E669 Obesity, unspecified: Secondary | ICD-10-CM | POA: Diagnosis not present

## 2013-10-09 ENCOUNTER — Encounter: Payer: Self-pay | Admitting: Internal Medicine

## 2013-10-09 ENCOUNTER — Ambulatory Visit (INDEPENDENT_AMBULATORY_CARE_PROVIDER_SITE_OTHER): Payer: Medicare Other | Admitting: Internal Medicine

## 2013-10-09 VITALS — BP 148/74 | HR 76 | Temp 97.9°F | Ht 72.0 in | Wt 189.0 lb

## 2013-10-09 DIAGNOSIS — M549 Dorsalgia, unspecified: Secondary | ICD-10-CM | POA: Diagnosis not present

## 2013-10-09 DIAGNOSIS — I10 Essential (primary) hypertension: Secondary | ICD-10-CM

## 2013-10-09 DIAGNOSIS — R609 Edema, unspecified: Secondary | ICD-10-CM

## 2013-10-09 MED ORDER — CARVEDILOL 12.5 MG PO TABS: 12.5000 mg | ORAL_TABLET | Freq: Two times a day (BID) | ORAL | Status: DC

## 2013-10-09 MED ORDER — OXYCODONE-ACETAMINOPHEN 5-325 MG PO TABS: 2.0000 | ORAL_TABLET | ORAL | Status: DC | PRN

## 2013-10-09 NOTE — Progress Notes (Signed)
Pre visit review using our clinic review tool, if applicable. No additional management support is needed unless otherwise documented below in the visit note. 

## 2013-10-09 NOTE — Progress Notes (Signed)
Subjective:    Patient ID: [name], male    DOB: [birth year], 80 y.o.   MRN: [id number]  Hypertension Associated symptoms include shortness of breath. Pertinent negatives include no chest pain, neck pain or palpitations.   Has noted some swelling in hand and elbow that responded to prednisone After the prednisone had worsening swelling in hand and elbow  As he reduced the medications the swelling resumed.  Stable HTN Stable lipids  Rhem referral for OA and was given prednisone again but he did not want to resume the prednisone Sed rate was 30  RA negative Edema in legs that resolves overnight  Worse as day goes ...  Prostate enlarged and gets up 3 x a night  Taking oxycodone every 12 hours for back and leg pain.    Review of Systems  Constitutional: Negative for fever and fatigue.  HENT: Negative for congestion, hearing loss and postnasal drip.   Eyes: Negative for discharge, redness and visual disturbance.  Respiratory: Positive for shortness of breath. Negative for cough and wheezing.   Cardiovascular: Positive for leg swelling. Negative for chest pain and palpitations.  Gastrointestinal: Negative for abdominal pain, constipation and abdominal distention.  Genitourinary: Negative for urgency and frequency.  Musculoskeletal: Positive for arthralgias. Negative for joint swelling and neck pain.  Skin: Negative for color change and rash.  Neurological: Negative for weakness and light-headedness.  Hematological: Negative for adenopathy.  Psychiatric/Behavioral: Negative for behavioral problems.   Past Medical History  Diagnosis Date  . ACTINIC KERATOSIS, HEAD 03/23/2008  . ALLERGIC RHINITIS 03/27/2007  . BENIGN PROSTATIC HYPERTROPHY, WITH URINARY OBSTRUCTION 10/16/2007  . CARPAL TUNNEL SYNDROME, LEFT 04/13/2008  . ERECTILE DYSFUNCTION, ORGANIC 02/20/2010  . Gout, unspecified 02/20/2010  . HYPERLIPIDEMIA, WITH HIGH HDL 10/18/2008  . KNEE PAIN 10/10/2009  . LOW BACK PAIN  03/27/2007  . NEOPLASM, MALIGNANT, SKIN, TRUNK 05/09/2007  . Patellar tendinitis 10/10/2009  . SKIN CANCER, HX OF 03/27/2007  . UNS ADVRS EFF UNS RX MEDICINAL&BIOLOGICAL SBSTNC 08/18/2007  . HYPERTENSION 05/09/2007    sees Dr. Benay Pillow  . Headache(784.0)     migraines hx of  . Chronic neck pain   . Chronic back pain   . CAD 08/18/2007    "patient denies any issues with heart, does not see cardiologist"  . Urinary urgency   . History of melanoma   . Arthritis     History   Social History  . Marital Status: Married    Spouse Name: N/A    Number of Children: N/A  . Years of Education: N/A   Occupational History  . Not on file.   Social History Main Topics  . Smoking status: Former Smoker -- 1.00 packs/day for 33 years    Quit date: 09/29/1981  . Smokeless tobacco: Not on file  . Alcohol Use: 1.5 oz/week    3 drink(s) per week     Comment: daily  . Drug Use: No  . Sexual Activity: Yes   Other Topics Concern  . Not on file   Social History Narrative  . No narrative on file    Past Surgical History  Procedure Laterality Date  . Knee arthroscopy    . Cholecystectomy    . Nasal sinus surgery    . Vasectomy    . Lumbar fusions    . Joint replacement surgery    . Rectal fissure    . Lateral fusion lumbar spine, transverse    . Laminectomy    . Lumbar  fusion    . Rectal fissure    . Vitrectomy      right and left 2013  . Carpal tunnel release    . Eye surgery      bilateral cataracts removed and vitrectomies  . Blepharoplasty    . Back surgery    . Posterior cervical fusion/foraminotomy Right 10/01/2012    Procedure: POSTERIOR CERVICAL FUSION/FORAMINOTOMY LEVEL 1;  Surgeon: Hosie Spangle, MD;  Location: [place] [facility];  Service: Neurosurgery;  Laterality: Right;  Right Cervical seven thoracic one Cervical laminectomy/foraminotomy with posterior cervical arthrodesis   . Joint replacement Right     knee  . Appendectomy  1969  . Lumbar laminectomy/decompression  microdiscectomy Left 11/03/2012    Procedure: LUMBAR LAMINECTOMY/DECOMPRESSION MICRODISCECTOMY 1 LEVEL;  Surgeon: Hosie Spangle, MD;  Location: [facility];  Service: Neurosurgery;  Laterality: Left;  LEFT L5S1 laminotomy foraminotomy and possible microdiskectomy  . Melanoma excision    . Posterior lumbar fusion N/A 05/23/2013    Procedure: exploration of lumbar wound, explantation of left interbody implant.;  Surgeon: Hosie Spangle, MD;  Location: [facility];  Service: Neurosurgery;  Laterality: N/A;    Family History  Problem Relation Age of Onset  . Dementia Mother   . Anemia Father   . Heart disease Father     Allergies  Allergen Reactions  . Gabapentin     Lower extremity edema    Current Outpatient Prescriptions on File Prior to Visit  Medication Sig Dispense Refill  . diltiazem (CARDIZEM CD) 300 MG 24 hr capsule Take 300 mg by mouth daily.      . hydrochlorothiazide (MICROZIDE) 12.5 MG capsule Take 1 capsule (12.5 mg total) by mouth daily.  30 capsule  11  . magnesium hydroxide (MILK OF MAGNESIA) 800 MG/5ML suspension Take 15 mLs by mouth every other day.      . rosuvastatin (CRESTOR) 20 MG tablet Take 20 mg by mouth once a week. On Friday      . telmisartan (MICARDIS) 80 MG tablet Take 80 mg by mouth daily.       Marland Kitchen terazosin (HYTRIN) 1 MG capsule Take 1 capsule (1 mg total) by mouth at bedtime.  30 capsule  1   No current facility-administered medications on file prior to visit.    BP 148/74  Pulse 76  Temp(Src) 97.9 F (36.6 C) (Oral)  Ht 6' (1.829 m)  Wt 189 lb (85.73 kg)  BMI 25.63 kg/m2       Objective:   Physical Exam  Nursing note and vitals reviewed. Constitutional: He appears well-developed and well-nourished.  HENT:  Head: Normocephalic and atraumatic.  Eyes: Conjunctivae are normal. Pupils are equal, round, and reactive to light.  Neck: Normal range of motion. Neck supple.  Cardiovascular: Normal rate.   Murmur heard. 2 plus edema    Pulmonary/Chest: Effort normal and breath sounds normal.  Abdominal: Soft. Bowel sounds are normal.          Assessment & Plan:  Due to the increase in peripheral edema we will discontinue the Cardizem and replace it with carvedilol.  Carvedilol should help to control rate and blood pressure but should not contribute to the edema  The constipation may also improve add the cardiazem  No SOB Back pain with compression fracture  On percocet 10 BID

## 2013-10-09 NOTE — Patient Instructions (Signed)
The patient is instructed to continue all medications as prescribed. Schedule followup with check out clerk upon leaving the clinic  

## 2013-10-12 ENCOUNTER — Telehealth: Payer: Self-pay | Admitting: Internal Medicine

## 2013-10-12 NOTE — Telephone Encounter (Signed)
Relevant patient education assigned to patient using Emmi. ° °

## 2013-10-24 ENCOUNTER — Other Ambulatory Visit: Payer: Self-pay | Admitting: Internal Medicine

## 2013-11-04 ENCOUNTER — Other Ambulatory Visit: Payer: Self-pay | Admitting: Internal Medicine

## 2013-11-09 ENCOUNTER — Encounter: Payer: Self-pay | Admitting: Internal Medicine

## 2013-11-09 MED ORDER — CARVEDILOL 25 MG PO TABS
25.0000 mg | ORAL_TABLET | Freq: Two times a day (BID) | ORAL | Status: DC
Start: 2013-11-09 — End: 2014-12-10

## 2013-11-17 DIAGNOSIS — T85890A Other specified complication of nervous system prosthetic devices, implants and grafts, initial encounter: Secondary | ICD-10-CM | POA: Diagnosis not present

## 2013-11-17 DIAGNOSIS — IMO0002 Reserved for concepts with insufficient information to code with codable children: Secondary | ICD-10-CM | POA: Diagnosis not present

## 2013-11-17 DIAGNOSIS — M48062 Spinal stenosis, lumbar region with neurogenic claudication: Secondary | ICD-10-CM | POA: Diagnosis not present

## 2013-11-17 DIAGNOSIS — Z6825 Body mass index (BMI) 25.0-25.9, adult: Secondary | ICD-10-CM | POA: Diagnosis not present

## 2013-11-17 DIAGNOSIS — I10 Essential (primary) hypertension: Secondary | ICD-10-CM | POA: Diagnosis not present

## 2013-11-18 DIAGNOSIS — T85890A Other specified complication of nervous system prosthetic devices, implants and grafts, initial encounter: Secondary | ICD-10-CM | POA: Diagnosis not present

## 2013-12-07 ENCOUNTER — Encounter: Payer: Self-pay | Admitting: Internal Medicine

## 2013-12-07 ENCOUNTER — Ambulatory Visit (INDEPENDENT_AMBULATORY_CARE_PROVIDER_SITE_OTHER): Payer: Medicare Other | Admitting: Family

## 2013-12-07 ENCOUNTER — Encounter: Payer: Self-pay | Admitting: Family

## 2013-12-07 VITALS — BP 188/110 | HR 74 | Temp 98.6°F | Ht 72.0 in | Wt 192.0 lb

## 2013-12-07 DIAGNOSIS — I10 Essential (primary) hypertension: Secondary | ICD-10-CM

## 2013-12-07 DIAGNOSIS — G8929 Other chronic pain: Secondary | ICD-10-CM | POA: Diagnosis not present

## 2013-12-07 DIAGNOSIS — M545 Low back pain, unspecified: Secondary | ICD-10-CM

## 2013-12-07 DIAGNOSIS — Z79899 Other long term (current) drug therapy: Secondary | ICD-10-CM | POA: Diagnosis not present

## 2013-12-07 LAB — COMPREHENSIVE METABOLIC PANEL
ALT: 17 U/L (ref 0–53)
AST: 18 U/L (ref 0–37)
Albumin: 3.8 g/dL (ref 3.5–5.2)
Alkaline Phosphatase: 75 U/L (ref 39–117)
BUN: 18 mg/dL (ref 6–23)
CO2: 26 mEq/L (ref 19–32)
Calcium: 9.1 mg/dL (ref 8.4–10.5)
Chloride: 106 mEq/L (ref 96–112)
Creatinine, Ser: 1 mg/dL (ref 0.4–1.5)
GFR: 77.25 mL/min (ref >60.00)
Glucose, Bld: 95 mg/dL (ref 70–99)
Potassium: 4.1 mEq/L (ref 3.5–5.1)
Sodium: 139 mEq/L (ref 135–145)
Total Bilirubin: 1 mg/dL (ref 0.3–1.2)
Total Protein: 6.4 g/dL (ref 6.0–8.3)

## 2013-12-07 MED ORDER — LOSARTAN POTASSIUM-HCTZ 100-12.5 MG PO TABS: 1.0000 | ORAL_TABLET | Freq: Every day | ORAL | Status: DC

## 2013-12-07 NOTE — Progress Notes (Signed)
Pre visit review using our clinic review tool, if applicable. No additional management support is needed unless otherwise documented below in the visit note. 

## 2013-12-07 NOTE — Patient Instructions (Signed)
Sodium-Controlled Diet Sodium is a mineral. It is found in many foods. Sodium may be found naturally or added during the making of a food. The most common form of sodium is salt, which is made up of sodium and chloride. Reducing your sodium intake involves changing your eating habits. The following guidelines will help you reduce the sodium in your diet:  Stop using the salt shaker.  Use salt sparingly in cooking and baking.  Substitute with sodium-free seasonings and spices.  Do not use a salt substitute (potassium chloride) without your caregiver's permission.  Include a variety of fresh, unprocessed foods in your diet.  Limit the use of processed and convenience foods that are high in sodium. USE THE FOLLOWING FOODS SPARINGLY: Breads/Starches  Commercial bread stuffing, commercial pancake or waffle mixes, coating mixes. Waffles. Croutons. Prepared (boxed or frozen) potato, rice, or noodle mixes that contain salt or sodium. Salted French fries or hash browns. Salted popcorn, breads, crackers, chips, or snack foods. Vegetables  Vegetables canned with salt or prepared in cream, butter, or cheese sauces. Sauerkraut. Tomato or vegetable juices canned with salt.  Fresh vegetables are allowed if rinsed thoroughly. Fruit  Fruit is okay to eat. Meat and Meat Substitutes  Salted or smoked meats, such as bacon or Canadian bacon, chipped or corned beef, hot dogs, salt pork, luncheon meats, pastrami, ham, or sausage. Canned or smoked fish, poultry, or meat. Processed cheese or cheese spreads, blue or Roquefort cheese. Battered or frozen fish products. Prepared spaghetti sauce. Baked beans. Reuben sandwiches. Salted nuts. Caviar. Milk  Limit buttermilk to 1 cup per week. Soups and Combination Foods  Bouillon cubes, canned or dried soups, broth, consomm. Convenience (frozen or packaged) dinners with more than 600 mg sodium. Pot pies, pizza, Asian food, fast food cheeseburgers, and specialty  sandwiches. Desserts and Sweets  Regular (salted) desserts, pie, commercial fruit snack pies, commercial snack cakes, canned puddings.  Eat desserts and sweets in moderation. Fats and Oils  Gravy mixes or canned gravy. No more than 1 to 2 tbs of salad dressing. Chip dips.  Eat fats and oils in moderation. Beverages  See those listed under the vegetables and milk groups. Condiments  Ketchup, mustard, meat sauces, salsa, regular (salted) and lite soy sauce or mustard. Dill pickles, olives, meat tenderizer. Prepared horseradish or pickle relish. Dutch-processed cocoa. Baking powder or baking soda used medicinally. Worcestershire sauce. "Light" salt. Salt substitute, unless approved by your caregiver. Document Released: 01/19/2002 Document Revised: 10/22/2011 Document Reviewed: 08/22/2009 [employer] Patient Information 2014 [employer].  

## 2013-12-07 NOTE — Progress Notes (Signed)
Subjective:    Patient ID: [name], male    DOB: [birth year], 80 y.o.   MRN: [id number]  HPI  80 year old white male, nonsmoker, he said Dr. Arnoldo [name] presents today for recheck of hypertension. At his last office visit, carvedilol was discontinued. Since that time, peripheral edema has improved significantly however, his blood pressures been elevated. Reports a low-sodium diet. Reports frustration of caring for his leg at home who doesn't seem to have motivation to want to care for herself any longer. Coping well overall..   Review of Systems  Constitutional: Negative.   HENT: Negative.   Respiratory: Negative.   Cardiovascular: Negative.   Gastrointestinal: Negative.   Endocrine: Negative.   Genitourinary: Negative.   Musculoskeletal: Negative.   Skin: Negative.   Neurological: Negative.   Psychiatric/Behavioral: Negative.    Past Medical History  Diagnosis Date  . ACTINIC KERATOSIS, HEAD 03/23/2008  . ALLERGIC RHINITIS 03/27/2007  . BENIGN PROSTATIC HYPERTROPHY, WITH URINARY OBSTRUCTION 10/16/2007  . CARPAL TUNNEL SYNDROME, LEFT 04/13/2008  . ERECTILE DYSFUNCTION, ORGANIC 02/20/2010  . Gout, unspecified 02/20/2010  . HYPERLIPIDEMIA, WITH HIGH HDL 10/18/2008  . KNEE PAIN 10/10/2009  . LOW BACK PAIN 03/27/2007  . NEOPLASM, MALIGNANT, SKIN, TRUNK 05/09/2007  . Patellar tendinitis 10/10/2009  . SKIN CANCER, HX OF 03/27/2007  . UNS ADVRS EFF UNS RX MEDICINAL&BIOLOGICAL SBSTNC 08/18/2007  . HYPERTENSION 05/09/2007    sees Dr. Benay [name]  . Headache(784.0)     migraines hx of  . Chronic neck pain   . Chronic back pain   . CAD 08/18/2007    "patient denies any issues with heart, does not see cardiologist"  . Urinary urgency   . History of melanoma   . Arthritis     History   Social History  . Marital Status: Married    Spouse Name: N/A    Number of Children: N/A  . Years of Education: N/A   Occupational History  . Not on file.   Social History Main Topics  . Smoking  status: Former Smoker -- 1.00 packs/day for 33 years    Quit date: 09/29/1981  . Smokeless tobacco: Not on file  . Alcohol Use: 1.5 oz/week    3 drink(s) per week     Comment: daily  . Drug Use: No  . Sexual Activity: Yes   Other Topics Concern  . Not on file   Social History Narrative  . No narrative on file    Past Surgical History  Procedure Laterality Date  . Knee arthroscopy    . Cholecystectomy    . Nasal sinus surgery    . Vasectomy    . Lumbar fusions    . Joint replacement surgery    . Rectal fissure    . Lateral fusion lumbar spine, transverse    . Laminectomy    . Lumbar fusion    . Rectal fissure    . Vitrectomy      right and left 2013  . Carpal tunnel release    . Eye surgery      bilateral cataracts removed and vitrectomies  . Blepharoplasty    . Back surgery    . Posterior cervical fusion/foraminotomy Right 10/01/2012    Procedure: POSTERIOR CERVICAL FUSION/FORAMINOTOMY LEVEL 1;  Surgeon: Jordan Spangle, MD;  Location: [place] [facility];  Service: Neurosurgery;  Laterality: Right;  Right Cervical seven thoracic one Cervical laminectomy/foraminotomy with posterior cervical arthrodesis   . Joint replacement Right     knee  .  Appendectomy  1969  . Lumbar laminectomy/decompression microdiscectomy Left 11/03/2012    Procedure: LUMBAR LAMINECTOMY/DECOMPRESSION MICRODISCECTOMY 1 LEVEL;  Surgeon: Jordan Spangle, MD;  Location: [place] [facility];  Service: Neurosurgery;  Laterality: Left;  LEFT L5S1 laminotomy foraminotomy and possible microdiskectomy  . Melanoma excision    . Posterior lumbar fusion N/A 05/23/2013    Procedure: exploration of lumbar wound, explantation of left interbody implant.;  Surgeon: Jordan Spangle, MD;  Location: [facility];  Service: Neurosurgery;  Laterality: N/A;    Family History  Problem Relation Age of Onset  . Dementia Mother   . Anemia Father   . Heart disease Father     Allergies  Allergen Reactions  . Gabapentin     Lower  extremity edema    Current Outpatient Prescriptions on File Prior to Visit  Medication Sig Dispense Refill  . carvedilol (COREG) 25 MG tablet Take 1 tablet (25 mg total) by mouth 2 (two) times daily with a meal.  60 tablet  11  . magnesium hydroxide (MILK OF MAGNESIA) 800 MG/5ML suspension Take 15 mLs by mouth every other day.      . oxyCODONE-acetaminophen (PERCOCET/ROXICET) 5-325 MG per tablet Take 2 tablets by mouth every 4 (four) hours as needed.  75 tablet  0  . rosuvastatin (CRESTOR) 20 MG tablet Take 20 mg by mouth once a week. On Friday      . terazosin (HYTRIN) 1 MG capsule Take 1 capsule (1 mg total) by mouth at bedtime.  30 capsule  1  . triamcinolone cream (KENALOG) 0.5 % USE AS DIRECTED  60 g  2   No current facility-administered medications on file prior to visit.    BP 188/110  Pulse 74  Temp(Src) 98.6 F (37 C) (Oral)  Ht 6' (1.829 m)  Wt 192 lb (87.091 kg)  BMI 26.03 kg/m2  SpO2 98%chart    Objective:   Physical Exam  Constitutional: He is oriented to person, place, and time. He appears well-developed and well-nourished.  HENT:  Right Ear: External ear normal.  Left Ear: External ear normal.  Nose: Nose normal.  Mouth/Throat: Oropharynx is clear and moist.  Neck: Normal range of motion. No thyromegaly present.  Cardiovascular: Normal rate, regular rhythm and normal heart sounds.   Pulmonary/Chest: Effort normal and breath sounds normal.  Abdominal: Soft. Bowel sounds are normal.  Genitourinary: Penis normal.  Musculoskeletal: Normal range of motion. He exhibits edema.  1+ peripheral edema   Neurological: He is alert and oriented to person, place, and time.  Skin: Skin is warm and dry.  Psychiatric: He has a normal mood and affect.          Assessment & Plan:  [name] was seen today for no specified reason.  Diagnoses and associated orders for this visit:  Hypertension, uncontrolled - CMP - Cancel: CMP  Chronic low back pain  Other  Orders - losartan-hydrochlorothiazide (HYZAAR) 100-12.5 MG per tablet; Take 1 tablet by mouth daily.   Call the office with any questions or concerns. Recheck blood pressure in 2 weeks and sooner as needed.

## 2013-12-18 ENCOUNTER — Other Ambulatory Visit: Payer: Self-pay | Admitting: *Deleted

## 2013-12-18 MED ORDER — IRBESARTAN 300 MG PO TABS: 300.0000 mg | ORAL_TABLET | Freq: Every day | ORAL | Status: DC

## 2013-12-21 ENCOUNTER — Ambulatory Visit: Payer: Medicare Other | Admitting: Family

## 2013-12-22 ENCOUNTER — Encounter: Payer: Self-pay | Admitting: Family

## 2013-12-22 ENCOUNTER — Ambulatory Visit (INDEPENDENT_AMBULATORY_CARE_PROVIDER_SITE_OTHER): Payer: Medicare Other | Admitting: Family

## 2013-12-22 VITALS — BP 160/92 | HR 71 | Temp 98.3°F | Ht 72.0 in | Wt 189.0 lb

## 2013-12-22 DIAGNOSIS — R609 Edema, unspecified: Secondary | ICD-10-CM

## 2013-12-22 DIAGNOSIS — I10 Essential (primary) hypertension: Secondary | ICD-10-CM | POA: Diagnosis not present

## 2013-12-22 MED ORDER — AMLODIPINE BESYLATE 5 MG PO TABS: 5.0000 mg | ORAL_TABLET | Freq: Every day | ORAL | Status: DC

## 2013-12-22 NOTE — Progress Notes (Signed)
Pre visit review using our clinic review tool, if applicable. No additional management support is needed unless otherwise documented below in the visit note. 

## 2013-12-22 NOTE — Patient Instructions (Signed)
Sodium-Controlled Diet Sodium is a mineral. It is found in many foods. Sodium may be found naturally or added during the making of a food. The most common form of sodium is salt, which is made up of sodium and chloride. Reducing your sodium intake involves changing your eating habits. The following guidelines will help you reduce the sodium in your diet:  Stop using the salt shaker.  Use salt sparingly in cooking and baking.  Substitute with sodium-free seasonings and spices.  Do not use a salt substitute (potassium chloride) without your caregiver's permission.  Include a variety of fresh, unprocessed foods in your diet.  Limit the use of processed and convenience foods that are high in sodium. USE THE FOLLOWING FOODS SPARINGLY: Breads/Starches  Commercial bread stuffing, commercial pancake or waffle mixes, coating mixes. Waffles. Croutons. Prepared (boxed or frozen) potato, rice, or noodle mixes that contain salt or sodium. Salted French fries or hash browns. Salted popcorn, breads, crackers, chips, or snack foods. Vegetables  Vegetables canned with salt or prepared in cream, butter, or cheese sauces. Sauerkraut. Tomato or vegetable juices canned with salt.  Fresh vegetables are allowed if rinsed thoroughly. Fruit  Fruit is okay to eat. Meat and Meat Substitutes  Salted or smoked meats, such as bacon or Canadian bacon, chipped or corned beef, hot dogs, salt pork, luncheon meats, pastrami, ham, or sausage. Canned or smoked fish, poultry, or meat. Processed cheese or cheese spreads, blue or Roquefort cheese. Battered or frozen fish products. Prepared spaghetti sauce. Baked beans. Reuben sandwiches. Salted nuts. Caviar. Milk  Limit buttermilk to 1 cup per week. Soups and Combination Foods  Bouillon cubes, canned or dried soups, broth, consomm. Convenience (frozen or packaged) dinners with more than 600 mg sodium. Pot pies, pizza, Asian food, fast food cheeseburgers, and specialty  sandwiches. Desserts and Sweets  Regular (salted) desserts, pie, commercial fruit snack pies, commercial snack cakes, canned puddings.  Eat desserts and sweets in moderation. Fats and Oils  Gravy mixes or canned gravy. No more than 1 to 2 tbs of salad dressing. Chip dips.  Eat fats and oils in moderation. Beverages  See those listed under the vegetables and milk groups. Condiments  Ketchup, mustard, meat sauces, salsa, regular (salted) and lite soy sauce or mustard. Dill pickles, olives, meat tenderizer. Prepared horseradish or pickle relish. Dutch-processed cocoa. Baking powder or baking soda used medicinally. Worcestershire sauce. "Light" salt. Salt substitute, unless approved by your caregiver. Document Released: 01/19/2002 Document Revised: 10/22/2011 Document Reviewed: 08/22/2009 [employer] Patient Information 2014 [employer].  

## 2013-12-23 ENCOUNTER — Encounter: Payer: Self-pay | Admitting: Family

## 2013-12-23 ENCOUNTER — Other Ambulatory Visit: Payer: Self-pay

## 2013-12-23 ENCOUNTER — Other Ambulatory Visit: Payer: Self-pay | Admitting: *Deleted

## 2013-12-23 ENCOUNTER — Encounter: Payer: Self-pay | Admitting: Internal Medicine

## 2013-12-23 NOTE — Progress Notes (Signed)
Subjective:    Patient ID: [name], male    DOB: May 12, 1932, 80 y.o.   MRN: [id number]  HPI  80 year old white male, nonsmoker is in today for recheck of hypertension and peripheral edema. His last office visit we initiated Hyzaar 100/12.5 mg for which he is tolerating well. Edema has resolved completely. However, his blood pressure elevations 086-578 systolically over 46-962. He is also taking Coreg 25 mg twice a day. Denies any lightheadedness, dizziness.   Review of Systems  Constitutional: Negative.   HENT: Negative.   Respiratory: Negative.   Cardiovascular: Negative.  Negative for leg swelling.  Gastrointestinal: Negative.   Endocrine: Negative.   Genitourinary: Negative.   Musculoskeletal: Negative.   Skin: Negative.   Neurological: Negative.   Hematological: Negative.   Psychiatric/Behavioral: Negative.    Past Medical History  Diagnosis Date  . ACTINIC KERATOSIS, HEAD 03/23/2008  . ALLERGIC RHINITIS 03/27/2007  . BENIGN PROSTATIC HYPERTROPHY, WITH URINARY OBSTRUCTION 10/16/2007  . CARPAL TUNNEL SYNDROME, LEFT 04/13/2008  . ERECTILE DYSFUNCTION, ORGANIC 02/20/2010  . Gout, unspecified 02/20/2010  . HYPERLIPIDEMIA, WITH HIGH HDL 10/18/2008  . KNEE PAIN 10/10/2009  . LOW BACK PAIN 03/27/2007  . NEOPLASM, MALIGNANT, SKIN, TRUNK 05/09/2007  . Patellar tendinitis 10/10/2009  . SKIN CANCER, HX OF 03/27/2007  . UNS ADVRS EFF UNS RX MEDICINAL&BIOLOGICAL SBSTNC 08/18/2007  . HYPERTENSION 05/09/2007    sees Dr. Benay Pillow  . Headache(784.0)     migraines hx of  . Chronic neck pain   . Chronic back pain   . CAD 08/18/2007    "patient denies any issues with heart, does not see cardiologist"  . Urinary urgency   . History of melanoma   . Arthritis     History   Social History  . Marital Status: Married    Spouse Name: N/A    Number of Children: N/A  . Years of Education: N/A   Occupational History  . Not on file.   Social History Main Topics  . Smoking status: Former  Smoker -- 1.00 packs/day for 33 years    Quit date: 09/29/1981  . Smokeless tobacco: Not on file  . Alcohol Use: 1.5 oz/week    3 drink(s) per week     Comment: daily  . Drug Use: No  . Sexual Activity: Yes   Other Topics Concern  . Not on file   Social History Narrative  . No narrative on file    Past Surgical History  Procedure Laterality Date  . Knee arthroscopy    . Cholecystectomy    . Nasal sinus surgery    . Vasectomy    . Lumbar fusions    . Joint replacement surgery    . Rectal fissure    . Lateral fusion lumbar spine, transverse    . Laminectomy    . Lumbar fusion    . Rectal fissure    . Vitrectomy      right and left 2013  . Carpal tunnel release    . Eye surgery      bilateral cataracts removed and vitrectomies  . Blepharoplasty    . Back surgery    . Posterior cervical fusion/foraminotomy Right 10/01/2012    Procedure: POSTERIOR CERVICAL FUSION/FORAMINOTOMY LEVEL 1;  Surgeon: Hosie Spangle, MD;  Location: [place] [facility];  Service: Neurosurgery;  Laterality: Right;  Right Cervical seven thoracic one Cervical laminectomy/foraminotomy with posterior cervical arthrodesis   . Joint replacement Right     knee  . Appendectomy  1969  . Lumbar laminectomy/decompression microdiscectomy Left 11/03/2012    Procedure: LUMBAR LAMINECTOMY/DECOMPRESSION MICRODISCECTOMY 1 LEVEL;  Surgeon: Hosie Spangle, MD;  Location: [place] [facility];  Service: Neurosurgery;  Laterality: Left;  LEFT L5S1 laminotomy foraminotomy and possible microdiskectomy  . Melanoma excision    . Posterior lumbar fusion N/A 05/23/2013    Procedure: exploration of lumbar wound, explantation of left interbody implant.;  Surgeon: Hosie Spangle, MD;  Location: [place];  Service: Neurosurgery;  Laterality: N/A;    Family History  Problem Relation Age of Onset  . Dementia Mother   . Anemia Father   . Heart disease Father     Allergies  Allergen Reactions  . Gabapentin     Lower extremity edema     Current Outpatient Prescriptions on File Prior to Visit  Medication Sig Dispense Refill  . carvedilol (COREG) 25 MG tablet Take 1 tablet (25 mg total) by mouth 2 (two) times daily with a meal.  60 tablet  11  . losartan-hydrochlorothiazide (HYZAAR) 100-12.5 MG per tablet Take 1 tablet by mouth daily.  30 tablet  3  . magnesium hydroxide (MILK OF MAGNESIA) 800 MG/5ML suspension Take 15 mLs by mouth every other day.      . oxyCODONE-acetaminophen (PERCOCET/ROXICET) 5-325 MG per tablet Take 2 tablets by mouth every 4 (four) hours as needed.  75 tablet  0  . rosuvastatin (CRESTOR) 20 MG tablet Take 20 mg by mouth once a week. On Friday      . terazosin (HYTRIN) 1 MG capsule Take 1 capsule (1 mg total) by mouth at bedtime.  30 capsule  1  . triamcinolone cream (KENALOG) 0.5 % USE AS DIRECTED  60 g  2   No current facility-administered medications on file prior to visit.    BP 160/92  Pulse 71  Temp(Src) 98.3 F (36.8 C) (Oral)  Ht 6' (1.829 m)  Wt 189 lb (85.73 kg)  BMI 25.63 kg/m2  SpO2 97%chart    Objective:   Physical Exam  Constitutional: He appears well-developed and well-nourished.  Neck: Normal range of motion. Neck supple.  Cardiovascular: Normal rate and normal heart sounds.   Pulmonary/Chest: Effort normal and breath sounds normal.  Abdominal: Soft. Bowel sounds are normal.  Musculoskeletal: Normal range of motion. He exhibits no edema.  Neurological: He is alert.  Skin: Skin is warm and dry.  Psychiatric: He has a normal mood and affect.          Assessment & Plan:   Sigismund was seen today for follow-up.  Diagnoses and associated orders for this visit:  Peripheral edema  Uncontrolled hypertension  Other Orders - amLODipine (NORVASC) 5 MG tablet; Take 1 tablet (5 mg total) by mouth daily.    continue current medications. Add amlodipine, grams once daily. Recheck in 3 weeks. Low-sodium diet.

## 2013-12-24 ENCOUNTER — Other Ambulatory Visit: Payer: Self-pay

## 2013-12-24 ENCOUNTER — Telehealth: Payer: Self-pay

## 2013-12-24 NOTE — Telephone Encounter (Signed)
Pt left message with concerns of whether or not he is suppose to be taking telmisartan along with the BP meds that were Rx'ed by [facility].  Per [facility], pt should be taking Coreg, Norvasc and Hyzaar only.   Pt aware and verbalized understanding

## 2014-01-02 ENCOUNTER — Other Ambulatory Visit: Payer: Self-pay | Admitting: Internal Medicine

## 2014-01-12 ENCOUNTER — Encounter: Payer: Self-pay | Admitting: Family

## 2014-01-12 ENCOUNTER — Ambulatory Visit (INDEPENDENT_AMBULATORY_CARE_PROVIDER_SITE_OTHER): Payer: Medicare Other | Admitting: Family

## 2014-01-12 VITALS — BP 152/80 | HR 63 | Temp 98.7°F | Ht 72.0 in | Wt 196.0 lb

## 2014-01-12 DIAGNOSIS — I10 Essential (primary) hypertension: Secondary | ICD-10-CM | POA: Diagnosis not present

## 2014-01-12 DIAGNOSIS — R609 Edema, unspecified: Secondary | ICD-10-CM | POA: Diagnosis not present

## 2014-01-12 DIAGNOSIS — E7800 Pure hypercholesterolemia, unspecified: Secondary | ICD-10-CM | POA: Diagnosis not present

## 2014-01-12 MED ORDER — LOSARTAN POTASSIUM-HCTZ 100-25 MG PO TABS: 1.0000 | ORAL_TABLET | Freq: Every day | ORAL | Status: DC

## 2014-01-12 NOTE — Patient Instructions (Signed)
Sodium-Controlled Diet Sodium is a mineral. It is found in many foods. Sodium may be found naturally or added during the making of a food. The most common form of sodium is salt, which is made up of sodium and chloride. Reducing your sodium intake involves changing your eating habits. The following guidelines will help you reduce the sodium in your diet:  Stop using the salt shaker.  Use salt sparingly in cooking and baking.  Substitute with sodium-free seasonings and spices.  Do not use a salt substitute (potassium chloride) without your caregiver's permission.  Include a variety of fresh, unprocessed foods in your diet.  Limit the use of processed and convenience foods that are high in sodium. USE THE FOLLOWING FOODS SPARINGLY: Breads/Starches  Commercial bread stuffing, commercial pancake or waffle mixes, coating mixes. Waffles. Croutons. Prepared (boxed or frozen) potato, rice, or noodle mixes that contain salt or sodium. Salted French fries or hash browns. Salted popcorn, breads, crackers, chips, or snack foods. Vegetables  Vegetables canned with salt or prepared in cream, butter, or cheese sauces. Sauerkraut. Tomato or vegetable juices canned with salt.  Fresh vegetables are allowed if rinsed thoroughly. Fruit  Fruit is okay to eat. Meat and Meat Substitutes  Salted or smoked meats, such as bacon or Canadian bacon, chipped or corned beef, hot dogs, salt pork, luncheon meats, pastrami, ham, or sausage. Canned or smoked fish, poultry, or meat. Processed cheese or cheese spreads, blue or Roquefort cheese. Battered or frozen fish products. Prepared spaghetti sauce. Baked beans. Reuben sandwiches. Salted nuts. Caviar. Milk  Limit buttermilk to 1 cup per week. Soups and Combination Foods  Bouillon cubes, canned or dried soups, broth, consomm. Convenience (frozen or packaged) dinners with more than 600 mg sodium. Pot pies, pizza, Asian food, fast food cheeseburgers, and specialty  sandwiches. Desserts and Sweets  Regular (salted) desserts, pie, commercial fruit snack pies, commercial snack cakes, canned puddings.  Eat desserts and sweets in moderation. Fats and Oils  Gravy mixes or canned gravy. No more than 1 to 2 tbs of salad dressing. Chip dips.  Eat fats and oils in moderation. Beverages  See those listed under the vegetables and milk groups. Condiments  Ketchup, mustard, meat sauces, salsa, regular (salted) and lite soy sauce or mustard. Dill pickles, olives, meat tenderizer. Prepared horseradish or pickle relish. Dutch-processed cocoa. Baking powder or baking soda used medicinally. Worcestershire sauce. "Light" salt. Salt substitute, unless approved by your caregiver. Document Released: 01/19/2002 Document Revised: 10/22/2011 Document Reviewed: 08/22/2009 [employer] Patient Information 2014 [employer].  

## 2014-01-12 NOTE — Progress Notes (Signed)
Pre visit review using our clinic review tool, if applicable. No additional management support is needed unless otherwise documented below in the visit note. 

## 2014-01-13 ENCOUNTER — Encounter: Payer: Self-pay | Admitting: Family

## 2014-01-13 NOTE — Progress Notes (Signed)
Subjective:    Patient ID: [name], male    DOB: 08/05/1932, 80 y.o.   MRN: [id number]  HPI  80 year old WM, nonsmoker, is in today for a recheck of Hypertension. Is on Norvasc and Hyzaar and tolerating it well. Reports home blood pressure readings being 332-951O systolic. He has mild swelling in the lower extremities but has improved.   Review of Systems  Constitutional: Negative.   HENT: Negative.   Respiratory: Negative.   Cardiovascular: Negative.   Gastrointestinal: Negative.   Endocrine: Negative.   Genitourinary: Negative.   Musculoskeletal: Negative.   Skin: Negative.   Allergic/Immunologic: Negative.   Neurological: Negative.   Hematological: Negative.   Psychiatric/Behavioral: Negative.    Past Medical History  Diagnosis Date  . ACTINIC KERATOSIS, HEAD 03/23/2008  . ALLERGIC RHINITIS 03/27/2007  . BENIGN PROSTATIC HYPERTROPHY, WITH URINARY OBSTRUCTION 10/16/2007  . CARPAL TUNNEL SYNDROME, LEFT 04/13/2008  . ERECTILE DYSFUNCTION, ORGANIC 02/20/2010  . Gout, unspecified 02/20/2010  . HYPERLIPIDEMIA, WITH HIGH HDL 10/18/2008  . KNEE PAIN 10/10/2009  . LOW BACK PAIN 03/27/2007  . NEOPLASM, MALIGNANT, SKIN, TRUNK 05/09/2007  . Patellar tendinitis 10/10/2009  . SKIN CANCER, HX OF 03/27/2007  . UNS ADVRS EFF UNS RX MEDICINAL&BIOLOGICAL SBSTNC 08/18/2007  . HYPERTENSION 05/09/2007    sees Dr. Benay [name]  . Headache(784.0)     migraines hx of  . Chronic neck pain   . Chronic back pain   . CAD 08/18/2007    "patient denies any issues with heart, does not see cardiologist"  . Urinary urgency   . History of melanoma   . Arthritis     History   Social History  . Marital Status: Married    Spouse Name: N/A    Number of Children: N/A  . Years of Education: N/A   Occupational History  . Not on file.   Social History Main Topics  . Smoking status: Former Smoker -- 1.00 packs/day for 33 years    Quit date: 09/29/1981  . Smokeless tobacco: Not on file  . Alcohol Use:  1.5 oz/week    3 drink(s) per week     Comment: daily  . Drug Use: No  . Sexual Activity: Yes   Other Topics Concern  . Not on file   Social History Narrative  . No narrative on file    Past Surgical History  Procedure Laterality Date  . Knee arthroscopy    . Cholecystectomy    . Nasal sinus surgery    . Vasectomy    . Lumbar fusions    . Joint replacement surgery    . Rectal fissure    . Lateral fusion lumbar spine, transverse    . Laminectomy    . Lumbar fusion    . Rectal fissure    . Vitrectomy      right and left 2013  . Carpal tunnel release    . Eye surgery      bilateral cataracts removed and vitrectomies  . Blepharoplasty    . Back surgery    . Posterior cervical fusion/foraminotomy Right 10/01/2012    Procedure: POSTERIOR CERVICAL FUSION/FORAMINOTOMY LEVEL 1;  Surgeon: Hosie Spangle, MD;  Location: [place] [facility];  Service: Neurosurgery;  Laterality: Right;  Right Cervical seven thoracic one Cervical laminectomy/foraminotomy with posterior cervical arthrodesis   . Joint replacement Right     knee  . Appendectomy  1969  . Lumbar laminectomy/decompression microdiscectomy Left 11/03/2012    Procedure: LUMBAR LAMINECTOMY/DECOMPRESSION  MICRODISCECTOMY 1 LEVEL;  Surgeon: Hosie Spangle, MD;  Location: [place] [facility];  Service: Neurosurgery;  Laterality: Left;  LEFT L5S1 laminotomy foraminotomy and possible microdiskectomy  . Melanoma excision    . Posterior lumbar fusion N/A 05/23/2013    Procedure: exploration of lumbar wound, explantation of left interbody implant.;  Surgeon: Hosie Spangle, MD;  Location: [place];  Service: Neurosurgery;  Laterality: N/A;    Family History  Problem Relation Age of Onset  . Dementia Mother   . Anemia Father   . Heart disease Father     Allergies  Allergen Reactions  . Gabapentin     Lower extremity edema    Current Outpatient Prescriptions on File Prior to Visit  Medication Sig Dispense Refill  . amLODipine  (NORVASC) 5 MG tablet Take 1 tablet (5 mg total) by mouth daily.  30 tablet  3  . carvedilol (COREG) 25 MG tablet Take 1 tablet (25 mg total) by mouth 2 (two) times daily with a meal.  60 tablet  11  . magnesium hydroxide (MILK OF MAGNESIA) 800 MG/5ML suspension Take 15 mLs by mouth every other day.      . oxyCODONE-acetaminophen (PERCOCET/ROXICET) 5-325 MG per tablet Take 2 tablets by mouth every 4 (four) hours as needed.  75 tablet  0  . rosuvastatin (CRESTOR) 20 MG tablet Take 20 mg by mouth once a week. On Friday      . terazosin (HYTRIN) 1 MG capsule TAKE 1 CAPSULE (1 MG TOTAL) BY MOUTH AT BEDTIME.  30 capsule  3  . triamcinolone cream (KENALOG) 0.5 % USE AS DIRECTED  60 g  2   No current facility-administered medications on file prior to visit.    BP 152/80  Pulse 63  Temp(Src) 98.7 F (37.1 C) (Oral)  Ht 6' (1.829 m)  Wt 196 lb (88.905 kg)  BMI 26.58 kg/m2  SpO2 98%chart    Objective:   Physical Exam  Constitutional: He is oriented to person, place, and time. He appears well-developed and well-nourished.  HENT:  Right Ear: External ear normal.  Left Ear: External ear normal.  Nose: Nose normal.  Mouth/Throat: Oropharynx is clear and moist.  Neck: Normal range of motion. Neck supple.  Cardiovascular: Normal rate, regular rhythm and normal heart sounds.   150/70  Pulmonary/Chest: Effort normal and breath sounds normal.  Abdominal: Soft. Bowel sounds are normal.  Musculoskeletal: Normal range of motion.  Neurological: He is alert and oriented to person, place, and time.  Skin: Skin is warm and dry.  Psychiatric: He has a normal mood and affect.          Assessment & Plan:  [name] was seen today for follow-up.  Diagnoses and associated orders for this visit:  Hypertension  Pure hypercholesterolemia  Peripheral edema  Other Orders - losartan-hydrochlorothiazide (HYZAAR) 100-25 MG per tablet; Take 1 tablet by mouth daily.   Increase Hyzaar. Recheck in 1  month.

## 2014-01-20 ENCOUNTER — Other Ambulatory Visit: Payer: Self-pay

## 2014-01-20 ENCOUNTER — Other Ambulatory Visit: Payer: Self-pay | Admitting: Internal Medicine

## 2014-01-27 ENCOUNTER — Encounter: Payer: Self-pay | Admitting: Internal Medicine

## 2014-02-02 ENCOUNTER — Encounter: Payer: Self-pay | Admitting: Family

## 2014-02-02 ENCOUNTER — Ambulatory Visit (INDEPENDENT_AMBULATORY_CARE_PROVIDER_SITE_OTHER): Payer: Medicare Other | Admitting: Family

## 2014-02-02 VITALS — BP 150/74 | HR 75 | Temp 98.4°F | Wt 196.0 lb

## 2014-02-02 DIAGNOSIS — E785 Hyperlipidemia, unspecified: Secondary | ICD-10-CM

## 2014-02-02 DIAGNOSIS — K5900 Constipation, unspecified: Secondary | ICD-10-CM | POA: Diagnosis not present

## 2014-02-02 DIAGNOSIS — H43399 Other vitreous opacities, unspecified eye: Secondary | ICD-10-CM | POA: Diagnosis not present

## 2014-02-02 DIAGNOSIS — I10 Essential (primary) hypertension: Secondary | ICD-10-CM

## 2014-02-02 DIAGNOSIS — H43819 Vitreous degeneration, unspecified eye: Secondary | ICD-10-CM | POA: Diagnosis not present

## 2014-02-02 DIAGNOSIS — H35079 Retinal telangiectasis, unspecified eye: Secondary | ICD-10-CM | POA: Diagnosis not present

## 2014-02-02 DIAGNOSIS — H35069 Retinal vasculitis, unspecified eye: Secondary | ICD-10-CM | POA: Diagnosis not present

## 2014-02-02 MED ORDER — OLMESARTAN-AMLODIPINE-HCTZ 40-5-25 MG PO TABS: 1.0000 | ORAL_TABLET | Freq: Every day | ORAL | Status: DC

## 2014-02-02 MED ORDER — LINACLOTIDE 145 MCG PO CAPS: 145.0000 ug | ORAL_CAPSULE | Freq: Every day | ORAL | Status: DC

## 2014-02-02 NOTE — Patient Instructions (Signed)
Hypertension Hypertension, commonly called high blood pressure, is when the force of blood pumping through your arteries is too strong. Your arteries are the blood vessels that carry blood from your heart throughout your body. A blood pressure reading consists of a higher number over a lower number, such as 110/72. The higher number (systolic) is the pressure inside your arteries when your heart pumps. The lower number (diastolic) is the pressure inside your arteries when your heart relaxes. Ideally you want your blood pressure below 120/80. Hypertension forces your heart to work harder to pump blood. Your arteries may become narrow or stiff. Having hypertension puts you at risk for heart disease, stroke, and other problems.  RISK FACTORS Some risk factors for high blood pressure are controllable. Others are not.  Risk factors you cannot control include:   Race. You may be at higher risk if you are African American.  Age. Risk increases with age.  Gender. Men are at higher risk than women before age 45 years. After age 65, women are at higher risk than men. Risk factors you can control include:  Not getting enough exercise or physical activity.  Being overweight.  Getting too much fat, sugar, calories, or salt in your diet.  Drinking too much alcohol. SIGNS AND SYMPTOMS Hypertension does not usually cause signs or symptoms. Extremely high blood pressure (hypertensive crisis) may cause headache, anxiety, shortness of breath, and nosebleed. DIAGNOSIS  To check if you have hypertension, your health care provider will measure your blood pressure while you are seated, with your arm held at the level of your heart. It should be measured at least twice using the same arm. Certain conditions can cause a difference in blood pressure between your right and left arms. A blood pressure reading that is higher than normal on one occasion does not mean that you need treatment. If one blood pressure reading  is high, ask your health care provider about having it checked again. TREATMENT  Treating high blood pressure includes making lifestyle changes and possibly taking medication. Living a healthy lifestyle can help lower high blood pressure. You may need to change some of your habits. Lifestyle changes may include:  Following the DASH diet. This diet is high in fruits, vegetables, and whole grains. It is low in salt, red meat, and added sugars.  Getting at least 2 1/2 hours of brisk physical activity every week.  Losing weight if necessary.  Not smoking.  Limiting alcoholic beverages.  Learning ways to reduce stress. If lifestyle changes are not enough to get your blood pressure under control, your health care provider may prescribe medicine. You may need to take more than one. Work closely with your health care provider to understand the risks and benefits. HOME CARE INSTRUCTIONS  Have your blood pressure rechecked as directed by your health care provider.   Only take medicine as directed by your health care provider. Follow the directions carefully. Blood pressure medicines must be taken as prescribed. The medicine does not work as well when you skip doses. Skipping doses also puts you at risk for problems.   Do not smoke.   Monitor your blood pressure at home as directed by your health care provider. SEEK MEDICAL CARE IF:   You think you are having a reaction to medicines taken.  You have recurrent headaches or feel dizzy.  You have swelling in your ankles.  You have trouble with your vision. SEEK IMMEDIATE MEDICAL CARE IF:  You develop a severe headache or   confusion.  You have unusual weakness, numbness, or feel faint.  You have severe chest or abdominal pain.  You vomit repeatedly.  You have trouble breathing. MAKE SURE YOU:   Understand these instructions.  Will watch your condition.  Will get help right away if you are not doing well or get  worse. Document Released: 07/30/2005 Document Revised: 08/04/2013 Document Reviewed: 05/22/2013 Exit[employer] Patient Information 2015 [employer]. This information is not intended to replace advice given to you by your health care provider. Make sure you discuss any questions you have with your health care provider.  

## 2014-02-02 NOTE — Progress Notes (Signed)
Subjective:    Patient ID: [name], male    DOB: October 10, 1931, 80 y.o.   MRN: [id number]  HPI 80 y.o. White male presents today for hypertension follow up after having his losartan-hydrochlorothiazide increased during his last visit. Pt has been recording blood pressure readings systolic 962-836 and diastolic 62-947. Pt denies headaches, vision changes and palpitations. Pt admits to eating occasional fried food, but states he mainly eats healthy. Exercise has consisted of putting an elevator in his house. Denies fever, fatigue, chills and change in appetite.     Review of Systems  Constitutional: Negative.  Negative for fever, chills and fatigue.  Eyes: Negative.   Respiratory: Negative.   Endocrine: Negative.   Musculoskeletal: Negative.   Skin: Negative.   Allergic/Immunologic: Negative.   Neurological: Negative.   Hematological: Negative.   Psychiatric/Behavioral: Negative.    Past Medical History  Diagnosis Date  . ACTINIC KERATOSIS, HEAD 03/23/2008  . ALLERGIC RHINITIS 03/27/2007  . BENIGN PROSTATIC HYPERTROPHY, WITH URINARY OBSTRUCTION 10/16/2007  . CARPAL TUNNEL SYNDROME, LEFT 04/13/2008  . ERECTILE DYSFUNCTION, ORGANIC 02/20/2010  . Gout, unspecified 02/20/2010  . HYPERLIPIDEMIA, WITH HIGH HDL 10/18/2008  . KNEE PAIN 10/10/2009  . LOW BACK PAIN 03/27/2007  . NEOPLASM, MALIGNANT, SKIN, TRUNK 05/09/2007  . Patellar tendinitis 10/10/2009  . SKIN CANCER, HX OF 03/27/2007  . UNS ADVRS EFF UNS RX MEDICINAL&BIOLOGICAL SBSTNC 08/18/2007  . HYPERTENSION 05/09/2007    sees Dr. Benay Pillow  . Headache(784.0)     migraines hx of  . Chronic neck pain   . Chronic back pain   . CAD 08/18/2007    "patient denies any issues with heart, does not see cardiologist"  . Urinary urgency   . History of melanoma   . Arthritis     History   Social History  . Marital Status: Married    Spouse Name: N/A    Number of Children: N/A  . Years of Education: N/A   Occupational History  . Not on  file.   Social History Main Topics  . Smoking status: Former Smoker -- 1.00 packs/day for 33 years    Quit date: 09/29/1981  . Smokeless tobacco: Not on file  . Alcohol Use: 1.5 oz/week    3 drink(s) per week     Comment: daily  . Drug Use: No  . Sexual Activity: Yes   Other Topics Concern  . Not on file   Social History Narrative  . No narrative on file    Past Surgical History  Procedure Laterality Date  . Knee arthroscopy    . Cholecystectomy    . Nasal sinus surgery    . Vasectomy    . Lumbar fusions    . Joint replacement surgery    . Rectal fissure    . Lateral fusion lumbar spine, transverse    . Laminectomy    . Lumbar fusion    . Rectal fissure    . Vitrectomy      right and left 2013  . Carpal tunnel release    . Eye surgery      bilateral cataracts removed and vitrectomies  . Blepharoplasty    . Back surgery    . Posterior cervical fusion/foraminotomy Right 10/01/2012    Procedure: POSTERIOR CERVICAL FUSION/FORAMINOTOMY LEVEL 1;  Surgeon: Hosie Spangle, MD;  Location: [place] [facility];  Service: Neurosurgery;  Laterality: Right;  Right Cervical seven thoracic one Cervical laminectomy/foraminotomy with posterior cervical arthrodesis   . Joint replacement Right  knee  . Appendectomy  1969  . Lumbar laminectomy/decompression microdiscectomy Left 11/03/2012    Procedure: LUMBAR LAMINECTOMY/DECOMPRESSION MICRODISCECTOMY 1 LEVEL;  Surgeon: Hosie Spangle, MD;  Location: [place] [facility];  Service: Neurosurgery;  Laterality: Left;  LEFT L5S1 laminotomy foraminotomy and possible microdiskectomy  . Melanoma excision    . Posterior lumbar fusion N/A 05/23/2013    Procedure: exploration of lumbar wound, explantation of left interbody implant.;  Surgeon: Hosie Spangle, MD;  Location: [place];  Service: Neurosurgery;  Laterality: N/A;    Family History  Problem Relation Age of Onset  . Dementia Mother   . Anemia Father   . Heart disease Father     Allergies    Allergen Reactions  . Gabapentin     Lower extremity edema    Current Outpatient Prescriptions on File Prior to Visit  Medication Sig Dispense Refill  . carvedilol (COREG) 25 MG tablet Take 1 tablet (25 mg total) by mouth 2 (two) times daily with a meal.  60 tablet  11  . magnesium hydroxide (MILK OF MAGNESIA) 800 MG/5ML suspension Take 15 mLs by mouth every other day.      . oxyCODONE-acetaminophen (PERCOCET/ROXICET) 5-325 MG per tablet Take 2 tablets by mouth every 4 (four) hours as needed.  75 tablet  0  . predniSONE (DELTASONE) 20 MG tablet TAKE 2 TABS FOR 2 DAYS THEN 1 TAB FOR 4 DAYS THEN 1/2 TAB FOR 4 DAYS  10 tablet  1  . rosuvastatin (CRESTOR) 20 MG tablet Take 20 mg by mouth once a week. On Friday      . terazosin (HYTRIN) 1 MG capsule TAKE 1 CAPSULE (1 MG TOTAL) BY MOUTH AT BEDTIME.  30 capsule  3  . triamcinolone cream (KENALOG) 0.5 % USE AS DIRECTED  60 g  2   No current facility-administered medications on file prior to visit.    BP 150/74  Pulse 75  Temp(Src) 98.4 F (36.9 C) (Oral)  Wt 196 lb (88.905 kg)  SpO2 97%chart    Objective:   Physical Exam  Constitutional: He appears well-developed and well-nourished. He is active.  Cardiovascular: Normal rate, regular rhythm, normal heart sounds and normal pulses.   Pulmonary/Chest: Effort normal and breath sounds normal.  Abdominal: Soft. Normal appearance and bowel sounds are normal.  Neurological: He is alert.  Skin: Skin is warm, dry and intact.  Psychiatric: He has a normal mood and affect. His speech is normal and behavior is normal.          Assessment & Plan:  Yosgar was seen today for follow-up.  Diagnoses and associated orders for this visit:  Unspecified essential hypertension  Other and unspecified hyperlipidemia  Unspecified constipation  Other Orders - Olmesartan-Amlodipine-HCTZ (TRIBENZOR) 40-5-25 MG TABS; Take 1 tablet by mouth daily. - Linaclotide (LINZESS) 145 MCG CAPS capsule; Take 1  capsule (145 mcg total) by mouth daily.     Education: Begin exercise 30 minutes per day. Decrease fatty foods. Medications discussed in detail with patient.   Follow up: 3 weeks.

## 2014-02-02 NOTE — Progress Notes (Signed)
Pre visit review using our clinic review tool, if applicable. No additional management support is needed unless otherwise documented below in the visit note. 

## 2014-02-10 ENCOUNTER — Other Ambulatory Visit: Payer: Self-pay | Admitting: Internal Medicine

## 2014-02-15 ENCOUNTER — Other Ambulatory Visit: Payer: Self-pay

## 2014-02-15 ENCOUNTER — Other Ambulatory Visit: Payer: Self-pay | Admitting: Family

## 2014-02-15 MED ORDER — LINACLOTIDE 145 MCG PO CAPS: 145.0000 ug | ORAL_CAPSULE | Freq: Every day | ORAL | Status: DC

## 2014-02-15 NOTE — Telephone Encounter (Signed)
Pt called stating that the sample Linzess works very well and would like a rx.  It was sent in qty 30 1 rf to pharm

## 2014-02-19 ENCOUNTER — Encounter: Payer: Self-pay | Admitting: Family

## 2014-02-19 ENCOUNTER — Ambulatory Visit (INDEPENDENT_AMBULATORY_CARE_PROVIDER_SITE_OTHER): Payer: Medicare Other | Admitting: Family

## 2014-02-19 VITALS — BP 132/68 | HR 76 | Ht 72.0 in | Wt 193.0 lb

## 2014-02-19 DIAGNOSIS — I10 Essential (primary) hypertension: Secondary | ICD-10-CM | POA: Diagnosis not present

## 2014-02-19 MED ORDER — OLMESARTAN-AMLODIPINE-HCTZ 40-5-25 MG PO TABS: 1.0000 | ORAL_TABLET | Freq: Every day | ORAL | Status: DC

## 2014-02-19 NOTE — Progress Notes (Signed)
   Subjective:    Patient ID: [name], male    DOB: 1931-08-17, 80 y.o.   MRN: [id number]  HPI  80 year old, nonsmoker, is in today for recheck of hypertension. He is on Benicar HCT and tolerating it well. Denies any concerns. Blood pressure readings at home have been between 123-163/61-85 with home cuff.   Review of Systems  Constitutional: Negative.   Respiratory: Negative.   Cardiovascular: Negative.   Endocrine: Negative.   Musculoskeletal: Negative.   Skin: Negative.   Allergic/Immunologic: Negative.   Psychiatric/Behavioral: Negative.        Objective:   Physical Exam  Constitutional: He is oriented to person, place, and time. He appears well-developed and well-nourished.  Neck: Normal range of motion. Neck supple.  Cardiovascular: Normal rate, regular rhythm and normal heart sounds.   Pulmonary/Chest: Effort normal and breath sounds normal.  Musculoskeletal: Normal range of motion.  Neurological: He is alert and oriented to person, place, and time.  Skin: Skin is warm and dry.  Psychiatric: He has a normal mood and affect.          Assessment & Plan:  [name] was seen today for hypertension.  Diagnoses and associated orders for this visit:  Unspecified essential hypertension  Other Orders - Olmesartan-Amlodipine-HCTZ (TRIBENZOR) 40-5-25 MG TABS; Take 1 tablet by mouth daily.   Continue current medication. Recheck in 3-4 months and sooner as needed.

## 2014-02-19 NOTE — Patient Instructions (Signed)

## 2014-02-19 NOTE — Progress Notes (Signed)
Pre visit review using our clinic review tool, if applicable. No additional management support is needed unless otherwise documented below in the visit note. 

## 2014-02-22 DIAGNOSIS — S322XXA Fracture of coccyx, initial encounter for closed fracture: Secondary | ICD-10-CM | POA: Diagnosis not present

## 2014-02-22 DIAGNOSIS — M5137 Other intervertebral disc degeneration, lumbosacral region: Secondary | ICD-10-CM | POA: Diagnosis not present

## 2014-02-22 DIAGNOSIS — M48062 Spinal stenosis, lumbar region with neurogenic claudication: Secondary | ICD-10-CM | POA: Diagnosis not present

## 2014-02-22 DIAGNOSIS — S3210XA Unspecified fracture of sacrum, initial encounter for closed fracture: Secondary | ICD-10-CM | POA: Diagnosis not present

## 2014-02-22 DIAGNOSIS — Q762 Congenital spondylolisthesis: Secondary | ICD-10-CM | POA: Diagnosis not present

## 2014-02-22 DIAGNOSIS — M47817 Spondylosis without myelopathy or radiculopathy, lumbosacral region: Secondary | ICD-10-CM | POA: Diagnosis not present

## 2014-02-23 ENCOUNTER — Other Ambulatory Visit: Payer: Self-pay | Admitting: Neurosurgery

## 2014-02-23 DIAGNOSIS — Q762 Congenital spondylolisthesis: Secondary | ICD-10-CM

## 2014-03-01 ENCOUNTER — Telehealth: Payer: Self-pay | Admitting: Internal Medicine

## 2014-03-01 NOTE — Telephone Encounter (Signed)
PA for Linzess was denied.  Pt must have a diagnosis of chronic idiopathic constipation or irritable bowel syndrome with constipation.

## 2014-03-02 DIAGNOSIS — D1801 Hemangioma of skin and subcutaneous tissue: Secondary | ICD-10-CM | POA: Diagnosis not present

## 2014-03-02 DIAGNOSIS — L819 Disorder of pigmentation, unspecified: Secondary | ICD-10-CM | POA: Diagnosis not present

## 2014-03-02 DIAGNOSIS — D235 Other benign neoplasm of skin of trunk: Secondary | ICD-10-CM | POA: Diagnosis not present

## 2014-03-02 DIAGNOSIS — L570 Actinic keratosis: Secondary | ICD-10-CM | POA: Diagnosis not present

## 2014-04-06 DIAGNOSIS — H35379 Puckering of macula, unspecified eye: Secondary | ICD-10-CM | POA: Diagnosis not present

## 2014-04-06 DIAGNOSIS — Z961 Presence of intraocular lens: Secondary | ICD-10-CM | POA: Diagnosis not present

## 2014-04-20 ENCOUNTER — Other Ambulatory Visit: Payer: Self-pay | Admitting: Internal Medicine

## 2014-05-05 ENCOUNTER — Encounter: Payer: Self-pay | Admitting: Physician Assistant

## 2014-05-05 ENCOUNTER — Telehealth: Payer: Self-pay

## 2014-05-05 ENCOUNTER — Ambulatory Visit (INDEPENDENT_AMBULATORY_CARE_PROVIDER_SITE_OTHER)
Admission: RE | Admit: 2014-05-05 | Discharge: 2014-05-05 | Disposition: A | Discharge status: Home / Self Care | Payer: Medicare Other | Source: Ambulatory Visit | Attending: Physician Assistant | Admitting: Physician Assistant

## 2014-05-05 ENCOUNTER — Ambulatory Visit (INDEPENDENT_AMBULATORY_CARE_PROVIDER_SITE_OTHER): Payer: Medicare Other | Admitting: Physician Assistant

## 2014-05-05 VITALS — BP 120/80 | HR 60 | Temp 98.2°F | Resp 18 | Wt 195.7 lb

## 2014-05-05 DIAGNOSIS — W19XXXA Unspecified fall, initial encounter: Secondary | ICD-10-CM

## 2014-05-05 DIAGNOSIS — M25569 Pain in unspecified knee: Secondary | ICD-10-CM

## 2014-05-05 DIAGNOSIS — S99919A Unspecified injury of unspecified ankle, initial encounter: Secondary | ICD-10-CM | POA: Diagnosis not present

## 2014-05-05 DIAGNOSIS — Y92009 Unspecified place in unspecified non-institutional (private) residence as the place of occurrence of the external cause: Secondary | ICD-10-CM

## 2014-05-05 DIAGNOSIS — M25562 Pain in left knee: Secondary | ICD-10-CM

## 2014-05-05 DIAGNOSIS — S8990XA Unspecified injury of unspecified lower leg, initial encounter: Secondary | ICD-10-CM | POA: Diagnosis not present

## 2014-05-05 MED ORDER — TERAZOSIN HCL 2 MG PO CAPS: 2.0000 mg | ORAL_CAPSULE | Freq: Every day | ORAL | Status: DC

## 2014-05-05 NOTE — Patient Instructions (Addendum)
Continue to use your analgesia to help with the pain.  Try Rest Ice Compression Elevation for your knee pain to help speed recovery.  We will obtain an Xray today at the [place] office to evaluate your knee for any acute injury and call you with the results.  If emergency symptoms discussed during visit developed, seek medical attention immediately.  Followup as needed, or for worsening or persistent symptoms despite treatment.     Knee Pain Knee pain can be a result of an injury or other medical conditions. Treatment will depend on the cause of your pain. HOME CARE  Only take medicine as told by your doctor.  Keep a healthy weight. Being overweight can make the knee hurt more.  Stretch before exercising or playing sports.  If there is constant knee pain, change the way you exercise. Ask your doctor for advice.  Make sure shoes fit well. Choose the right shoe for the sport or activity.  Protect your knees. Wear kneepads if needed.  Rest when you are tired. GET HELP RIGHT AWAY IF:   Your knee pain does not stop.  Your knee pain does not get better.  Your knee joint feels hot to the touch.  You have a fever. MAKE SURE YOU:   Understand these instructions.  Will watch this condition.  Will get help right away if you are not doing well or get worse. Document Released: 10/26/2008 Document Revised: 10/22/2011 Document Reviewed: 10/26/2008 [facility] Patient Information 2015 [place], [place]. This information is not intended to replace advice given to you by your health care provider. Make sure you discuss any questions you have with your health care provider.

## 2014-05-05 NOTE — Addendum Note (Signed)
Addended by: Santiago Bumpers on: 05/05/2014 10:45 AM   Modules accepted: Orders

## 2014-05-05 NOTE — Progress Notes (Signed)
Pre visit review using our clinic review tool, if applicable. No additional management support is needed unless otherwise documented below in the visit note. 

## 2014-05-05 NOTE — Progress Notes (Signed)
Subjective:    Patient ID: [name], male    DOB: 1932/04/09, 80 y.o.   MRN: [id number]  Fall The accident occurred more than 1 week ago (8 days ago). The fall occurred while standing (Pt states that he had been flying his model airplanes yesterday and took an oxycodone for his back pain, and then took another yesterday evening with his other medications out of habit, and blieves that this caused him to get dizzy and led to the fall.). He fell from a height of 3 to 5 ft. He landed on hard floor. There was no blood loss. Point of impact: Chin, Sternum, Back, Left Knee. The pain is present in the left knee and chin (twisted knee). The pain is at a severity of 8/10. The pain is severe. The symptoms are aggravated by ambulation, use of injured limb, flexion and extension (coughing/clearing throat causes sternum to hurt as well). Pertinent negatives include no abdominal pain, bowel incontinence, fever, headaches, hearing loss, hematuria, loss of consciousness, nausea, numbness, tingling, visual change or vomiting. Treatments tried: Oxycodone-acetaminophen as directed. The treatment provided mild relief.      Review of Systems  Constitutional: Negative for fever and chills.  Respiratory: Negative for shortness of breath.   Cardiovascular: Negative for chest pain.       Sternal pain  Gastrointestinal: Negative for nausea, vomiting, abdominal pain, diarrhea and bowel incontinence.  Genitourinary: Negative for hematuria.  Musculoskeletal: Positive for gait problem. Negative for joint swelling.  Neurological: Negative for tingling, loss of consciousness, syncope, numbness and headaches.  All other systems reviewed and are negative.    Past Medical History  Diagnosis Date  . ACTINIC KERATOSIS, HEAD 03/23/2008  . ALLERGIC RHINITIS 03/27/2007  . BENIGN PROSTATIC HYPERTROPHY, WITH URINARY OBSTRUCTION 10/16/2007  . CARPAL TUNNEL SYNDROME, LEFT 04/13/2008  . ERECTILE DYSFUNCTION, ORGANIC 02/20/2010  .  Gout, unspecified 02/20/2010  . HYPERLIPIDEMIA, WITH HIGH HDL 10/18/2008  . KNEE PAIN 10/10/2009  . LOW BACK PAIN 03/27/2007  . NEOPLASM, MALIGNANT, SKIN, TRUNK 05/09/2007  . Patellar tendinitis 10/10/2009  . SKIN CANCER, HX OF 03/27/2007  . UNS ADVRS EFF UNS RX MEDICINAL&BIOLOGICAL SBSTNC 08/18/2007  . HYPERTENSION 05/09/2007    sees Dr. Benay Pillow  . Headache(784.0)     migraines hx of  . Chronic neck pain   . Chronic back pain   . CAD 08/18/2007    "patient denies any issues with heart, does not see cardiologist"  . Urinary urgency   . History of melanoma   . Arthritis     History   Social History  . Marital Status: Married    Spouse Name: N/A    Number of Children: N/A  . Years of Education: N/A   Occupational History  . Not on file.   Social History Main Topics  . Smoking status: Former Smoker -- 1.00 packs/day for 33 years    Quit date: 09/29/1981  . Smokeless tobacco: Not on file  . Alcohol Use: 1.5 oz/week    3 drink(s) per week     Comment: daily  . Drug Use: No  . Sexual Activity: Yes   Other Topics Concern  . Not on file   Social History Narrative  . No narrative on file    Past Surgical History  Procedure Laterality Date  . Knee arthroscopy    . Cholecystectomy    . Nasal sinus surgery    . Vasectomy    . Lumbar fusions    . Joint  replacement surgery    . Rectal fissure    . Lateral fusion lumbar spine, transverse    . Laminectomy    . Lumbar fusion    . Rectal fissure    . Vitrectomy      right and left 2013  . Carpal tunnel release    . Eye surgery      bilateral cataracts removed and vitrectomies  . Blepharoplasty    . Back surgery    . Posterior cervical fusion/foraminotomy Right 10/01/2012    Procedure: POSTERIOR CERVICAL FUSION/FORAMINOTOMY LEVEL 1;  Surgeon: Hosie Spangle, MD;  Location: [facility];  Service: Neurosurgery;  Laterality: Right;  Right Cervical seven thoracic one Cervical laminectomy/foraminotomy with posterior cervical  arthrodesis   . Joint replacement Right     knee  . Appendectomy  1969  . Lumbar laminectomy/decompression microdiscectomy Left 11/03/2012    Procedure: LUMBAR LAMINECTOMY/DECOMPRESSION MICRODISCECTOMY 1 LEVEL;  Surgeon: Hosie Spangle, MD;  Location: [facility];  Service: Neurosurgery;  Laterality: Left;  LEFT L5S1 laminotomy foraminotomy and possible microdiskectomy  . Melanoma excision    . Posterior lumbar fusion N/A 05/23/2013    Procedure: exploration of lumbar wound, explantation of left interbody implant.;  Surgeon: Hosie Spangle, MD;  Location: [facility];  Service: Neurosurgery;  Laterality: N/A;    Family History  Problem Relation Age of Onset  . Dementia Mother   . Anemia Father   . Heart disease Father     Allergies  Allergen Reactions  . Gabapentin     Lower extremity edema    Current Outpatient Prescriptions on File Prior to Visit  Medication Sig Dispense Refill  . carvedilol (COREG) 25 MG tablet Take 1 tablet (25 mg total) by mouth 2 (two) times daily with a meal.  60 tablet  11  . magnesium hydroxide (MILK OF MAGNESIA) 800 MG/5ML suspension Take 15 mLs by mouth every other day.      . Olmesartan-Amlodipine-HCTZ (TRIBENZOR) 40-5-25 MG TABS Take 1 tablet by mouth daily.  30 tablet  3  . oxyCODONE-acetaminophen (PERCOCET/ROXICET) 5-325 MG per tablet Take 2 tablets by mouth every 4 (four) hours as needed.  75 tablet  0  . rosuvastatin (CRESTOR) 20 MG tablet Take 20 mg by mouth once a week. On Friday      . terazosin (HYTRIN) 1 MG capsule TAKE 1 CAPSULE (1 MG TOTAL) BY MOUTH AT BEDTIME.  30 capsule  3  . triamcinolone cream (KENALOG) 0.5 % USE AS DIRECTED  60 g  2   No current facility-administered medications on file prior to visit.    EXAM: BP 120/80  Pulse 60  Temp(Src) 98.2 F (36.8 C) (Oral)  Resp 18  Wt 195 lb 11.2 oz (88.769 kg)     Objective:   Physical Exam  Nursing note and vitals reviewed. Constitutional: He is oriented to person, place, and  time. He appears well-developed and well-nourished. No distress.  HENT:  Head: Normocephalic and atraumatic.  Eyes: Conjunctivae and EOM are normal. Pupils are equal, round, and reactive to light.  Neck: Normal range of motion. Neck supple.  Cardiovascular: Normal rate, regular rhythm and intact distal pulses.   Pulmonary/Chest: Effort normal and breath sounds normal. No respiratory distress. He has no wheezes. He has no rales. He exhibits no tenderness.  Mild mid-sternal ttp, more on the right than the left.  Musculoskeletal: Normal range of motion. He exhibits tenderness. He exhibits no edema.  Mild ttp of the left knee joint  space. Gait- walks with slight limp favoring the left knee.  Neurological: He is alert and oriented to person, place, and time.  Skin: Skin is warm and dry. No rash noted. He is not diaphoretic. No erythema. No pallor.  Psychiatric: He has a normal mood and affect. His behavior is normal. Judgment and thought content normal.     Lab Results  Component Value Date   WBC 11.6* 07/27/2013   HGB 11.6* 07/27/2013   HCT 36.0* 07/27/2013   PLT 681.0* 07/27/2013   GLUCOSE 95 12/07/2013   CHOL 181 08/12/2012   TRIG 81.0 08/12/2012   HDL 65.80 08/12/2012   LDLDIRECT 150.9 01/12/2009   LDLCALC 99 08/12/2012   ALT 17 12/07/2013   AST 18 12/07/2013   NA 139 12/07/2013   K 4.1 12/07/2013   CL 106 12/07/2013   CREATININE 1.0 12/07/2013   BUN 18 12/07/2013   CO2 26 12/07/2013   TSH 1.34 08/12/2012   PSA 1.02 08/12/2012   INR 2.24* 11/27/2009        Assessment & Plan:  Cal was seen today for fall.  Diagnoses and associated orders for this visit:  Fall at home, initial encounter Comments: Some residual sternum pain and left knee pain, Continue analgesia add RICE therapy, watchful waiting. - DG Knee Complete 4 Views Left; Future   Discussed with pt that his pain may not resolve for a few more weeks, however if it persists, he will need to be evaluated by his  orthopedist as he has had a history of meniscectomy in the same knee in the past. Patient is amenable to this plan.  Return precautions provided, and patient handout on knee pain.  Plan to follow up as needed, or for worsening or persistent symptoms despite treatment.  Patient Instructions  Continue to use your analgesia to help with the pain.  Try Rest Ice Compression Elevation for your knee pain to help speed recovery.  We will obtain an Xray today at the [place] office to evaluate your knee for any acute injury and call you with the results.  If emergency symptoms discussed during visit developed, seek medical attention immediately.  Followup as needed, or for worsening or persistent symptoms despite treatment.

## 2014-05-05 NOTE — Telephone Encounter (Signed)
Pt left message requesting to increase dosage of terazosin to help achieve better results for bladder issue  Pt's wife states that he is on his way to the office. I will discuss terazosin dosage with him at that time

## 2014-05-06 ENCOUNTER — Telehealth: Payer: Self-pay | Admitting: Family

## 2014-05-06 DIAGNOSIS — M545 Low back pain, unspecified: Secondary | ICD-10-CM | POA: Diagnosis not present

## 2014-05-06 DIAGNOSIS — M1710 Unilateral primary osteoarthritis, unspecified knee: Secondary | ICD-10-CM | POA: Diagnosis not present

## 2014-05-06 DIAGNOSIS — IMO0002 Reserved for concepts with insufficient information to code with codable children: Secondary | ICD-10-CM | POA: Diagnosis not present

## 2014-05-06 NOTE — Addendum Note (Signed)
Addended by: Colleen Can on: 05/06/2014 11:23 AM   Modules accepted: Orders

## 2014-05-07 ENCOUNTER — Telehealth: Payer: Self-pay

## 2014-05-07 NOTE — Telephone Encounter (Signed)
Pt called and states he received care from [facility]- he received an injection in his left knee but was told that there was a lot of arthritic tissue and the knee needed to be replaced. Pt states he is walking better and feeling better.  Pt wants to thank everyone for the excellent care.

## 2014-05-12 NOTE — Telephone Encounter (Signed)
error 

## 2014-05-20 ENCOUNTER — Ambulatory Visit
Admission: RE | Admit: 2014-05-20 | Discharge: 2014-05-20 | Disposition: A | Discharge status: Home / Self Care | Payer: Medicare Other | Source: Ambulatory Visit | Attending: Neurosurgery | Admitting: Neurosurgery

## 2014-05-20 DIAGNOSIS — Q762 Congenital spondylolisthesis: Secondary | ICD-10-CM

## 2014-05-20 DIAGNOSIS — M4807 Spinal stenosis, lumbosacral region: Secondary | ICD-10-CM | POA: Diagnosis not present

## 2014-05-20 DIAGNOSIS — M5126 Other intervertebral disc displacement, lumbar region: Secondary | ICD-10-CM | POA: Diagnosis not present

## 2014-05-20 DIAGNOSIS — M47816 Spondylosis without myelopathy or radiculopathy, lumbar region: Secondary | ICD-10-CM | POA: Diagnosis not present

## 2014-05-25 DIAGNOSIS — M5416 Radiculopathy, lumbar region: Secondary | ICD-10-CM | POA: Diagnosis not present

## 2014-05-25 DIAGNOSIS — S32009K Unspecified fracture of unspecified lumbar vertebra, subsequent encounter for fracture with nonunion: Secondary | ICD-10-CM | POA: Diagnosis not present

## 2014-05-25 DIAGNOSIS — S129XXA Fracture of neck, unspecified, initial encounter: Secondary | ICD-10-CM | POA: Insufficient documentation

## 2014-05-25 DIAGNOSIS — M5136 Other intervertebral disc degeneration, lumbar region: Secondary | ICD-10-CM | POA: Diagnosis not present

## 2014-05-25 DIAGNOSIS — M4726 Other spondylosis with radiculopathy, lumbar region: Secondary | ICD-10-CM | POA: Diagnosis not present

## 2014-06-08 ENCOUNTER — Encounter: Payer: Self-pay | Admitting: Family

## 2014-06-08 ENCOUNTER — Other Ambulatory Visit: Payer: Self-pay | Admitting: Family

## 2014-06-08 ENCOUNTER — Ambulatory Visit (INDEPENDENT_AMBULATORY_CARE_PROVIDER_SITE_OTHER): Payer: Medicare Other | Admitting: Family

## 2014-06-08 VITALS — BP 130/80 | HR 86 | Ht 72.0 in | Wt 192.6 lb

## 2014-06-08 DIAGNOSIS — R252 Cramp and spasm: Secondary | ICD-10-CM

## 2014-06-08 DIAGNOSIS — E785 Hyperlipidemia, unspecified: Secondary | ICD-10-CM | POA: Diagnosis not present

## 2014-06-08 DIAGNOSIS — I10 Essential (primary) hypertension: Secondary | ICD-10-CM | POA: Diagnosis not present

## 2014-06-08 DIAGNOSIS — Z23 Encounter for immunization: Secondary | ICD-10-CM

## 2014-06-08 DIAGNOSIS — M79604 Pain in right leg: Secondary | ICD-10-CM

## 2014-06-08 LAB — CBC WITH DIFFERENTIAL/PLATELET
Basophils Absolute: 0.1 10*3/uL (ref 0.0–0.1)
Basophils Relative: 0.7 % (ref 0.0–3.0)
Eosinophils Absolute: 0.3 10*3/uL (ref 0.0–0.7)
Eosinophils Relative: 3.9 % (ref 0.0–5.0)
HCT: 45.8 % (ref 39.0–52.0)
Hemoglobin: 15.1 g/dL (ref 13.0–17.0)
Lymphocytes Relative: 29.3 % (ref 12.0–46.0)
Lymphs Abs: 2.1 10*3/uL (ref 0.7–4.0)
MCHC: 32.9 g/dL (ref 30.0–36.0)
MCV: 89.9 fl (ref 78.0–100.0)
Monocytes Absolute: 0.7 10*3/uL (ref 0.1–1.0)
Monocytes Relative: 9 % (ref 3.0–12.0)
Neutro Abs: 4.2 10*3/uL (ref 1.4–7.7)
Neutrophils Relative %: 57.1 % (ref 43.0–77.0)
Platelets: 333 10*3/uL (ref 150.0–400.0)
RBC: 5.1 Mil/uL (ref 4.22–5.81)
RDW: 14.6 % (ref 11.5–15.5)
WBC: 7.3 10*3/uL (ref 4.0–10.5)

## 2014-06-08 LAB — LIPID PANEL
Cholesterol: 166 mg/dL (ref 0–200)
HDL: 67.5 mg/dL (ref >39.00)
LDL Cholesterol: 81 mg/dL (ref 0–99)
NonHDL: 98.5
Total CHOL/HDL Ratio: 2
Triglycerides: 89 mg/dL (ref 0.0–149.0)
VLDL: 17.8 mg/dL (ref 0.0–40.0)

## 2014-06-08 LAB — HEPATIC FUNCTION PANEL
ALT: 25 U/L (ref 0–53)
AST: 26 U/L (ref 0–37)
Albumin: 3.7 g/dL (ref 3.5–5.2)
Alkaline Phosphatase: 62 U/L (ref 39–117)
Bilirubin, Direct: 0.1 mg/dL (ref 0.0–0.3)
Total Bilirubin: 1 mg/dL (ref 0.2–1.2)
Total Protein: 7.5 g/dL (ref 6.0–8.3)

## 2014-06-08 LAB — BASIC METABOLIC PANEL
BUN: 25 mg/dL — ABNORMAL HIGH (ref 6–23)
CO2: 22 mEq/L (ref 19–32)
Calcium: 9.6 mg/dL (ref 8.4–10.5)
Chloride: 105 mEq/L (ref 96–112)
Creatinine, Ser: 1.3 mg/dL (ref 0.4–1.5)
GFR: 56.34 mL/min — ABNORMAL LOW (ref >60.00)
Glucose, Bld: 92 mg/dL (ref 70–99)
Potassium: 4.6 mEq/L (ref 3.5–5.1)
Sodium: 141 mEq/L (ref 135–145)

## 2014-06-08 LAB — MAGNESIUM: Magnesium: 1.9 mg/dL (ref 1.5–2.5)

## 2014-06-08 NOTE — Patient Instructions (Signed)

## 2014-06-08 NOTE — Progress Notes (Signed)
Pre visit review using our clinic review tool, if applicable. No additional management support is needed unless otherwise documented below in the visit note. 

## 2014-06-08 NOTE — Progress Notes (Signed)
Subjective:    Patient ID: [name], male    DOB: Sep 10, 1931, 80 y.o.   MRN: [id number]  HPI 80 year old white male, nonsmoker with a history of hypertension, hyperlipidemia, and BPH is in today for recheck. Has complaints of muscle aches and cramps particularly at night in his legs. He recently started Neurontin 14 days ago and in the past it caused him to be groggy. He has since stopped the medication but continues to cramp. Is attempting to follow-up A healthy diet not currently exercising. Continues to care for his wife.   Review of Systems  Constitutional: Negative.   HENT: Negative.   Respiratory: Negative.   Cardiovascular: Negative.   Gastrointestinal: Negative.   Endocrine: Negative.   Genitourinary: Negative.   Musculoskeletal:       Muscle cramps in the legs typically a night  Skin: Negative.   Neurological: Negative.   Hematological: Negative.   Psychiatric/Behavioral: Negative.    Past Medical History  Diagnosis Date  . ACTINIC KERATOSIS, HEAD 03/23/2008  . ALLERGIC RHINITIS 03/27/2007  . BENIGN PROSTATIC HYPERTROPHY, WITH URINARY OBSTRUCTION 10/16/2007  . CARPAL TUNNEL SYNDROME, LEFT 04/13/2008  . ERECTILE DYSFUNCTION, ORGANIC 02/20/2010  . Gout, unspecified 02/20/2010  . HYPERLIPIDEMIA, WITH HIGH HDL 10/18/2008  . KNEE PAIN 10/10/2009  . LOW BACK PAIN 03/27/2007  . NEOPLASM, MALIGNANT, SKIN, TRUNK 05/09/2007  . Patellar tendinitis 10/10/2009  . SKIN CANCER, HX OF 03/27/2007  . UNS ADVRS EFF UNS RX MEDICINAL&BIOLOGICAL SBSTNC 08/18/2007  . HYPERTENSION 05/09/2007    sees Dr. Benay Pillow  . Headache(784.0)     migraines hx of  . Chronic neck pain   . Chronic back pain   . CAD 08/18/2007    "patient denies any issues with heart, does not see cardiologist"  . Urinary urgency   . History of melanoma   . Arthritis     History   Social History  . Marital Status: Married    Spouse Name: N/A    Number of Children: N/A  . Years of Education: N/A   Occupational  History  . Not on file.   Social History Main Topics  . Smoking status: Former Smoker -- 1.00 packs/day for 33 years    Quit date: 09/29/1981  . Smokeless tobacco: Not on file  . Alcohol Use: 1.5 oz/week    3 drink(s) per week     Comment: daily  . Drug Use: No  . Sexual Activity: Yes   Other Topics Concern  . Not on file   Social History Narrative  . No narrative on file    Past Surgical History  Procedure Laterality Date  . Knee arthroscopy    . Cholecystectomy    . Nasal sinus surgery    . Vasectomy    . Lumbar fusions    . Joint replacement surgery    . Rectal fissure    . Lateral fusion lumbar spine, transverse    . Laminectomy    . Lumbar fusion    . Rectal fissure    . Vitrectomy      right and left 2013  . Carpal tunnel release    . Eye surgery      bilateral cataracts removed and vitrectomies  . Blepharoplasty    . Back surgery    . Posterior cervical fusion/foraminotomy Right 10/01/2012    Procedure: POSTERIOR CERVICAL FUSION/FORAMINOTOMY LEVEL 1;  Surgeon: Hosie Spangle, MD;  Location: [facility];  Service: Neurosurgery;  Laterality: Right;  Right  Cervical seven thoracic one Cervical laminectomy/foraminotomy with posterior cervical arthrodesis   . Joint replacement Right     knee  . Appendectomy  1969  . Lumbar laminectomy/decompression microdiscectomy Left 11/03/2012    Procedure: LUMBAR LAMINECTOMY/DECOMPRESSION MICRODISCECTOMY 1 LEVEL;  Surgeon: Hosie Spangle, MD;  Location: [facility];  Service: Neurosurgery;  Laterality: Left;  LEFT L5S1 laminotomy foraminotomy and possible microdiskectomy  . Melanoma excision    . Posterior lumbar fusion N/A 05/23/2013    Procedure: exploration of lumbar wound, explantation of left interbody implant.;  Surgeon: Hosie Spangle, MD;  Location: [facility];  Service: Neurosurgery;  Laterality: N/A;    Family History  Problem Relation Age of Onset  . Dementia Mother   . Anemia Father   . Heart disease Father      Allergies  Allergen Reactions  . Gabapentin     Lower extremity edema    Current Outpatient Prescriptions on File Prior to Visit  Medication Sig Dispense Refill  . carvedilol (COREG) 25 MG tablet Take 1 tablet (25 mg total) by mouth 2 (two) times daily with a meal.  60 tablet  11  . Olmesartan-Amlodipine-HCTZ (TRIBENZOR) 40-5-25 MG TABS Take 1 tablet by mouth daily.  30 tablet  3  . triamcinolone cream (KENALOG) 0.5 % USE AS DIRECTED  60 g  2  . magnesium hydroxide (MILK OF MAGNESIA) 800 MG/5ML suspension Take 15 mLs by mouth every other day.      . oxyCODONE-acetaminophen (PERCOCET/ROXICET) 5-325 MG per tablet Take 2 tablets by mouth every 4 (four) hours as needed.  75 tablet  0  . rosuvastatin (CRESTOR) 20 MG tablet Take 20 mg by mouth once a week. On Friday      . terazosin (HYTRIN) 2 MG capsule Take 1 capsule (2 mg total) by mouth at bedtime.  30 capsule  1   No current facility-administered medications on file prior to visit.    BP 130/80  Pulse 86  Ht 6' (1.829 m)  Wt 192 lb 9.6 oz (87.363 kg)  BMI 26.12 kg/m2chart    Objective:   Physical Exam  Constitutional: He is oriented to person, place, and time. He appears well-developed and well-nourished.  HENT:  Right Ear: External ear normal.  Left Ear: External ear normal.  Nose: Nose normal.  Mouth/Throat: Oropharynx is clear and moist.  Neck: Normal range of motion. Neck supple. No thyromegaly present.  Cardiovascular: Normal rate, regular rhythm and normal heart sounds.   Pulmonary/Chest: Effort normal and breath sounds normal.  Abdominal: Soft. Bowel sounds are normal.  Musculoskeletal: Normal range of motion. He exhibits no edema and no tenderness.  Neurological: He is alert and oriented to person, place, and time.  Skin: Skin is warm and dry.  Psychiatric: He has a normal mood and affect.          Assessment & Plan:  Stormy was seen today for follow-up.  Diagnoses and associated orders for this  visit:  Essential hypertension, benign - Basic Metabolic Panel - Hepatic Function Panel - Lipid Panel - CBC with Differential  Hyperlipidemia - Basic Metabolic Panel - Hepatic Function Panel - Lipid Panel - CBC with Differential  Muscle cramp, nocturnal - Basic Metabolic Panel - Hepatic Function Panel - Lipid Panel - CBC with Differential - Magnesium    call the office with any questions or concerns. Recheck pending labs, in 6 months, sooner as needed.

## 2014-06-18 ENCOUNTER — Ambulatory Visit (HOSPITAL_COMMUNITY): Payer: Medicare Other | Attending: Family | Admitting: *Deleted

## 2014-06-18 DIAGNOSIS — I10 Essential (primary) hypertension: Secondary | ICD-10-CM | POA: Diagnosis not present

## 2014-06-18 DIAGNOSIS — M79606 Pain in leg, unspecified: Secondary | ICD-10-CM | POA: Diagnosis present

## 2014-06-18 DIAGNOSIS — I2510 Atherosclerotic heart disease of native coronary artery without angina pectoris: Secondary | ICD-10-CM | POA: Diagnosis not present

## 2014-06-18 DIAGNOSIS — E785 Hyperlipidemia, unspecified: Secondary | ICD-10-CM | POA: Diagnosis not present

## 2014-06-18 DIAGNOSIS — M79605 Pain in left leg: Secondary | ICD-10-CM | POA: Diagnosis not present

## 2014-06-18 DIAGNOSIS — M79604 Pain in right leg: Secondary | ICD-10-CM | POA: Diagnosis not present

## 2014-06-18 DIAGNOSIS — M25569 Pain in unspecified knee: Secondary | ICD-10-CM | POA: Diagnosis not present

## 2014-06-18 NOTE — Progress Notes (Signed)
Venous Duplex Lower Bilateral Performed 

## 2014-06-21 ENCOUNTER — Other Ambulatory Visit: Payer: Self-pay | Admitting: Family

## 2014-06-21 DIAGNOSIS — I825Y9 Chronic embolism and thrombosis of unspecified deep veins of unspecified proximal lower extremity: Secondary | ICD-10-CM

## 2014-06-25 DIAGNOSIS — Z981 Arthrodesis status: Secondary | ICD-10-CM | POA: Diagnosis not present

## 2014-06-25 DIAGNOSIS — M5416 Radiculopathy, lumbar region: Secondary | ICD-10-CM | POA: Diagnosis not present

## 2014-06-25 DIAGNOSIS — M5136 Other intervertebral disc degeneration, lumbar region: Secondary | ICD-10-CM | POA: Diagnosis not present

## 2014-06-25 DIAGNOSIS — M4726 Other spondylosis with radiculopathy, lumbar region: Secondary | ICD-10-CM | POA: Diagnosis not present

## 2014-06-25 DIAGNOSIS — Z6826 Body mass index (BMI) 26.0-26.9, adult: Secondary | ICD-10-CM | POA: Diagnosis not present

## 2014-06-25 DIAGNOSIS — Z9889 Other specified postprocedural states: Secondary | ICD-10-CM | POA: Insufficient documentation

## 2014-06-27 ENCOUNTER — Other Ambulatory Visit: Payer: Self-pay | Admitting: Family

## 2014-07-07 ENCOUNTER — Encounter: Payer: Self-pay | Admitting: Surgery

## 2014-07-12 ENCOUNTER — Other Ambulatory Visit: Payer: Self-pay | Admitting: Family

## 2014-07-12 ENCOUNTER — Ambulatory Visit (INDEPENDENT_AMBULATORY_CARE_PROVIDER_SITE_OTHER): Payer: Medicare Other | Admitting: Surgery

## 2014-07-12 ENCOUNTER — Encounter: Payer: Self-pay | Admitting: Surgery

## 2014-07-12 ENCOUNTER — Telehealth: Payer: Self-pay | Admitting: Family

## 2014-07-12 VITALS — BP 149/84 | HR 70 | Ht 72.0 in | Wt 198.9 lb

## 2014-07-12 DIAGNOSIS — M7989 Other specified soft tissue disorders: Secondary | ICD-10-CM | POA: Diagnosis not present

## 2014-07-12 NOTE — Telephone Encounter (Signed)
FYI Pt saw vein specialist and per pt specialist said  There is nothing wrong with his veins

## 2014-07-12 NOTE — Progress Notes (Signed)
Patient name: [name] MRN: [id number] DOB: 1932-04-24 Sex: male   Referred by: Harlene Salts  Reason for referral:  Chief Complaint  Patient presents with  . New Evaluation    chronic superficial thrombus     HISTORY OF PRESENT ILLNESS: This is a very pleasant 80 year old gentleman who comes in today for evaluation of bilateral leg pain.  He states that his symptoms began on October 13 when he started taking a trial of Neurontin for neuropathic pain in his left leg.  He discontinued the medication on October 20.  He has not had any relief of his symptoms.  He states that he has leg cramps and leg fatigue with minimal activity.  His cramps can occur at night.  He states that if he pushes on the posterior thigh this will elicit a pain response which he describes as a cramping.  He can walk a significant distance but his legs will eventually give out and throb and cramping.  He does complain of some swelling.  Patient recently had a venous ultrasound which was negative for deep vein thrombosis, obstruction, or insufficiency.  There was a finding of a chronic right short saphenous thrombus.  The patient is medically managed for hypercholesterolemia with a statin   Past Medical History  Diagnosis Date  . ACTINIC KERATOSIS, HEAD 03/23/2008  . ALLERGIC RHINITIS 03/27/2007  . BENIGN PROSTATIC HYPERTROPHY, WITH URINARY OBSTRUCTION 10/16/2007  . CARPAL TUNNEL SYNDROME, LEFT 04/13/2008  . ERECTILE DYSFUNCTION, ORGANIC 02/20/2010  . Gout, unspecified 02/20/2010  . HYPERLIPIDEMIA, WITH HIGH HDL 10/18/2008  . KNEE PAIN 10/10/2009  . LOW BACK PAIN 03/27/2007  . NEOPLASM, MALIGNANT, SKIN, TRUNK 05/09/2007  . Patellar tendinitis 10/10/2009  . SKIN CANCER, HX OF 03/27/2007  . UNS ADVRS EFF UNS RX MEDICINAL&BIOLOGICAL SBSTNC 08/18/2007  . HYPERTENSION 05/09/2007    sees Dr. Benay Pillow  . Headache(784.0)     migraines hx of  . Chronic neck pain   . Chronic back pain   . CAD 08/18/2007    "patient  denies any issues with heart, does not see cardiologist"  . Urinary urgency   . History of melanoma   . Arthritis   . DVT (deep venous thrombosis)     Past Surgical History  Procedure Laterality Date  . Knee arthroscopy    . Cholecystectomy    . Nasal sinus surgery    . Vasectomy    . Lumbar fusions    . Joint replacement surgery    . Rectal fissure    . Lateral fusion lumbar spine, transverse    . Laminectomy    . Lumbar fusion    . Rectal fissure    . Vitrectomy      right and left 2013  . Carpal tunnel release    . Eye surgery      bilateral cataracts removed and vitrectomies  . Blepharoplasty    . Back surgery    . Posterior cervical fusion/foraminotomy Right 10/01/2012    Procedure: POSTERIOR CERVICAL FUSION/FORAMINOTOMY LEVEL 1;  Surgeon: Hosie Spangle, MD;  Location: [facility];  Service: Neurosurgery;  Laterality: Right;  Right Cervical seven thoracic one Cervical laminectomy/foraminotomy with posterior cervical arthrodesis   . Joint replacement Right     knee  . Appendectomy  1969  . Lumbar laminectomy/decompression microdiscectomy Left 11/03/2012    Procedure: LUMBAR LAMINECTOMY/DECOMPRESSION MICRODISCECTOMY 1 LEVEL;  Surgeon: Hosie Spangle, MD;  Location: [facility];  Service: Neurosurgery;  Laterality: Left;  LEFT L5S1 laminotomy foraminotomy and possible microdiskectomy  . Melanoma excision    . Posterior lumbar fusion N/A 05/23/2013    Procedure: exploration of lumbar wound, explantation of left interbody implant.;  Surgeon: Hosie Spangle, MD;  Location: [facility];  Service: Neurosurgery;  Laterality: N/A;    History   Social History  . Marital Status: Married    Spouse Name: N/A    Number of Children: N/A  . Years of Education: N/A   Occupational History  . Not on file.   Social History Main Topics  . Smoking status: Former Smoker -- 1.00 packs/day for 33 years    Quit date: 09/29/1981  . Smokeless tobacco: Not on file  . Alcohol Use: 1.8  oz/week    3 Not specified per week     Comment: daily  . Drug Use: No  . Sexual Activity: Yes   Other Topics Concern  . Not on file   Social History Narrative    Family History  Problem Relation Age of Onset  . Dementia Mother   . Anemia Father   . Heart disease Father     Allergies as of 07/12/2014 - Review Complete 07/12/2014  Allergen Reaction Noted  . Gabapentin  03/16/2013    Current Outpatient Prescriptions on File Prior to Visit  Medication Sig Dispense Refill  . carvedilol (COREG) 25 MG tablet Take 1 tablet (25 mg total) by mouth 2 (two) times daily with a meal. 60 tablet 11  . magnesium hydroxide (MILK OF MAGNESIA) 800 MG/5ML suspension Take 15 mLs by mouth every other day.    . oxyCODONE-acetaminophen (PERCOCET/ROXICET) 5-325 MG per tablet Take 2 tablets by mouth every 4 (four) hours as needed. 75 tablet 0  . rosuvastatin (CRESTOR) 20 MG tablet Take 20 mg by mouth once a week. On Friday    . terazosin (HYTRIN) 2 MG capsule Take 1 capsule (2 mg total) by mouth at bedtime. 30 capsule 1  . triamcinolone cream (KENALOG) 0.5 % USE AS DIRECTED 60 g 2  . TRIBENZOR 40-5-25 MG TABS TAKE 1 TABLET BY MOUTH DAILY. 30 tablet 5   No current facility-administered medications on file prior to visit.     REVIEW OF SYSTEMS: Cardiovascular: No chest pain, chest pressure, palpitations, orthopnea, or dyspnea on exertion. please see history of present illness Pulmonary: No productive cough, asthma or wheezing. Neurologic: shooting pain and numbness in his toes at the end of the day Hematologic: No bleeding problems or clotting disorders. Musculoskeletal: No joint pain or joint swelling. Gastrointestinal: No blood in stool or hematemesis Genitourinary: No dysuria or hematuria. Psychiatric:: No history of major depression. Integumentary: No rashes or ulcers. Constitutional: No fever or chills.  PHYSICAL EXAMINATION: General: The patient appears their stated age.  Vital signs are  BP 149/84 mmHg  Pulse 70  Ht 6' (1.829 m)  Wt 198 lb 14.4 oz (90.22 kg)  BMI 26.97 kg/m2  SpO2 96% HEENT:  No gross abnormalities Pulmonary: Respirations are non-labored Abdomen: Soft and non-tender  Musculoskeletal: There are no major deformities.   Neurologic: No focal weakness or paresthesias are detected, Skin: There are no ulcer or rashes noted. Psychiatric: The patient has normal affect. Cardiovascular: There is a regular rate and rhythm without significant murmur appreciated. palpable posterior tibial pulses.  1-2 plus pitting edema  Diagnostic Studies:  I have reviewed his outside venous ultrasound which shows no evidence of deep vein thrombosis, insufficiency, or obstruction.  There is a right short saphenous chronic thrombus  Assessment:  Bilateral leg pain Plan:  I discussed with the patient that I do not feel his symptoms are arterial in nature as he has palpable pedal pulses.  I also do not think that his symptoms are venous in nature given the fact that he has a recent ultrasound which was negative for insufficiency obstruction or thrombus.  I do not think his chronic SSV thrombus is contributing to his symptoms.  I told him that I do feel that his edema may be causing him some problems.  Therefore I have recommended that he try 20-45mmHg compression stockings as well as leg elevation.  I feel that if he is able to eliminate the swelling/edema in his legs that he will have an improvement in his symptoms.     Eldridge Abrahams, M.D. [facility] Office: [phone] Pager:  [phone]

## 2014-07-28 ENCOUNTER — Ambulatory Visit (INDEPENDENT_AMBULATORY_CARE_PROVIDER_SITE_OTHER): Payer: Medicare Other | Admitting: Family Medicine

## 2014-07-28 ENCOUNTER — Encounter: Payer: Self-pay | Admitting: Family Medicine

## 2014-07-28 ENCOUNTER — Ambulatory Visit: Payer: Medicare Other | Admitting: Family

## 2014-07-28 VITALS — BP 134/70 | HR 72 | Temp 98.1°F | Wt 199.0 lb

## 2014-07-28 DIAGNOSIS — R252 Cramp and spasm: Secondary | ICD-10-CM | POA: Diagnosis not present

## 2014-07-28 NOTE — Progress Notes (Signed)
Pre visit review using our clinic review tool, if applicable. No additional management support is needed unless otherwise documented below in the visit note. 

## 2014-07-28 NOTE — Patient Instructions (Signed)
Muscle Cramps and Spasms Muscle cramps and spasms occur when a muscle or muscles tighten and you have no control over this tightening (involuntary muscle contraction). They are a common problem and can develop in any muscle. The most common place is in the calf muscles of the leg. Both muscle cramps and muscle spasms are involuntary muscle contractions, but they also have differences:   Muscle cramps are sporadic and painful. They may last a few seconds to a quarter of an hour. Muscle cramps are often more forceful and last longer than muscle spasms.  Muscle spasms may or may not be painful. They may also last just a few seconds or much longer. CAUSES  It is uncommon for cramps or spasms to be due to a serious underlying problem. In many cases, the cause of cramps or spasms is unknown. Some common causes are:   Overexertion.   Overuse from repetitive motions (doing the same thing over and over).   Remaining in a certain position for a long period of time.   Improper preparation, form, or technique while performing a sport or activity.   Dehydration.   Injury.   Side effects of some medicines.   Abnormally low levels of the salts and ions in your blood (electrolytes), especially potassium and calcium. This could happen if you are taking water pills (diuretics) or you are pregnant.  Some underlying medical problems can make it more likely to develop cramps or spasms. These include, but are not limited to:   Diabetes.   Parkinson disease.   Hormone disorders, such as thyroid problems.   Alcohol abuse.   Diseases specific to muscles, joints, and bones.   Blood vessel disease where not enough blood is getting to the muscles.  HOME CARE INSTRUCTIONS   Stay well hydrated. Drink enough water and fluids to keep your urine clear or pale yellow.  It may be helpful to massage, stretch, and relax the affected muscle.  For tight or tense muscles, use a warm towel, heating  pad, or hot shower water directed to the affected area.  If you are sore or have pain after a cramp or spasm, applying ice to the affected area may relieve discomfort.  Put ice in a plastic bag.  Place a towel between your skin and the bag.  Leave the ice on for 15-20 minutes, 03-04 times a day.  Medicines used to treat a known cause of cramps or spasms may help reduce their frequency or severity. Only take over-the-counter or prescription medicines as directed by your caregiver. SEEK MEDICAL CARE IF:  Your cramps or spasms get more severe, more frequent, or do not improve over time.  MAKE SURE YOU:   Understand these instructions.  Will watch your condition.  Will get help right away if you are not doing well or get worse. Document Released: 01/19/2002 Document Revised: 11/24/2012 Document Reviewed: 07/16/2012 [facility] Patient Information 2015 [place], [place]. This information is not intended to replace advice given to you by your health care provider. Make sure you discuss any questions you have with your health care provider.  Stay well hydrated and consider tonic water Consider multivitamin

## 2014-07-28 NOTE — Progress Notes (Signed)
Subjective:    Patient ID: [name], male    DOB: 05/21/1932, 80 y.o.   MRN: [id number]  HPI Patient seen with diffuse muscle cramps involving his hands, arms, and legs over several months. He was treated gabapentin back couple months ago and states his cramps started after that. He has had persistent cramps after stopping gabapentin. He takes blood pressure medication including HCTZ. He had multiple labs including normal magnesium and electrolytes back in the fall. He does not drink a lot of water. He takes Crestor but only once weekly. No myalgias.  Past Medical History  Diagnosis Date  . ACTINIC KERATOSIS, HEAD 03/23/2008  . ALLERGIC RHINITIS 03/27/2007  . BENIGN PROSTATIC HYPERTROPHY, WITH URINARY OBSTRUCTION 10/16/2007  . CARPAL TUNNEL SYNDROME, LEFT 04/13/2008  . ERECTILE DYSFUNCTION, ORGANIC 02/20/2010  . Gout, unspecified 02/20/2010  . HYPERLIPIDEMIA, WITH HIGH HDL 10/18/2008  . KNEE PAIN 10/10/2009  . LOW BACK PAIN 03/27/2007  . NEOPLASM, MALIGNANT, SKIN, TRUNK 05/09/2007  . Patellar tendinitis 10/10/2009  . SKIN CANCER, HX OF 03/27/2007  . UNS ADVRS EFF UNS RX MEDICINAL&BIOLOGICAL SBSTNC 08/18/2007  . HYPERTENSION 05/09/2007    sees Dr. Benay Pillow  . Headache(784.0)     migraines hx of  . Chronic neck pain   . Chronic back pain   . CAD 08/18/2007    "patient denies any issues with heart, does not see cardiologist"  . Urinary urgency   . History of melanoma   . Arthritis   . DVT (deep venous thrombosis)    Past Surgical History  Procedure Laterality Date  . Knee arthroscopy    . Cholecystectomy    . Nasal sinus surgery    . Vasectomy    . Lumbar fusions    . Joint replacement surgery    . Rectal fissure    . Lateral fusion lumbar spine, transverse    . Laminectomy    . Lumbar fusion    . Rectal fissure    . Vitrectomy      right and left 2013  . Carpal tunnel release    . Eye surgery      bilateral cataracts removed and vitrectomies  . Blepharoplasty    . Back  surgery    . Posterior cervical fusion/foraminotomy Right 10/01/2012    Procedure: POSTERIOR CERVICAL FUSION/FORAMINOTOMY LEVEL 1;  Surgeon: Hosie Spangle, MD;  Location: [facility];  Service: Neurosurgery;  Laterality: Right;  Right Cervical seven thoracic one Cervical laminectomy/foraminotomy with posterior cervical arthrodesis   . Joint replacement Right     knee  . Appendectomy  1969  . Lumbar laminectomy/decompression microdiscectomy Left 11/03/2012    Procedure: LUMBAR LAMINECTOMY/DECOMPRESSION MICRODISCECTOMY 1 LEVEL;  Surgeon: Hosie Spangle, MD;  Location: [facility];  Service: Neurosurgery;  Laterality: Left;  LEFT L5S1 laminotomy foraminotomy and possible microdiskectomy  . Melanoma excision    . Posterior lumbar fusion N/A 05/23/2013    Procedure: exploration of lumbar wound, explantation of left interbody implant.;  Surgeon: Hosie Spangle, MD;  Location: [facility];  Service: Neurosurgery;  Laterality: N/A;    reports that he quit smoking about 32 years ago. He does not have any smokeless tobacco history on file. He reports that he drinks about 1.8 oz of alcohol per week. He reports that he does not use illicit drugs. family history includes Anemia in his father; Dementia in his mother; Heart disease in his father. Allergies  Allergen Reactions  . Gabapentin     Lower  extremity edema      Review of Systems  Constitutional: Negative for appetite change and unexpected weight change.  Respiratory: Negative for shortness of breath.   Cardiovascular: Negative for chest pain.       Objective:   Physical Exam  Constitutional: He appears well-developed and well-nourished.  Cardiovascular: Normal rate and regular rhythm.   Pulmonary/Chest: Effort normal and breath sounds normal. No respiratory distress. He has no wheezes. He has no rales.  Musculoskeletal: He exhibits no edema.  Distal foot pulses are normal bilaterally          Assessment & Plan:  Intermittent  muscle cramps. Recent electrolytes all normal. Increase hydration. Consider tonic water. Consider multi B vitamin

## 2014-08-04 ENCOUNTER — Telehealth: Payer: Self-pay | Admitting: *Deleted

## 2014-08-04 ENCOUNTER — Other Ambulatory Visit: Payer: Self-pay | Admitting: *Deleted

## 2014-08-04 MED ORDER — PREDNISONE 20 MG PO TABS: ORAL_TABLET | ORAL | Status: AC

## 2014-08-04 NOTE — Telephone Encounter (Signed)
Fax from [facility] requesting refill on Prednisone 20 mg take 2 tabs x2 days, then 1 tab x4 days then 1/2 tab x4 days  #10

## 2014-08-04 NOTE — Telephone Encounter (Signed)
Ok to fill 

## 2014-08-04 NOTE — Telephone Encounter (Signed)
Pt states that he has gout in left great toe.  He states its a chronic issue with him

## 2014-08-04 NOTE — Telephone Encounter (Signed)
For

## 2014-09-07 DIAGNOSIS — L570 Actinic keratosis: Secondary | ICD-10-CM | POA: Diagnosis not present

## 2014-09-07 DIAGNOSIS — Z85828 Personal history of other malignant neoplasm of skin: Secondary | ICD-10-CM | POA: Diagnosis not present

## 2014-09-07 DIAGNOSIS — Z08 Encounter for follow-up examination after completed treatment for malignant neoplasm: Secondary | ICD-10-CM | POA: Diagnosis not present

## 2014-09-07 DIAGNOSIS — Z85820 Personal history of malignant melanoma of skin: Secondary | ICD-10-CM | POA: Diagnosis not present

## 2014-09-07 DIAGNOSIS — L814 Other melanin hyperpigmentation: Secondary | ICD-10-CM | POA: Diagnosis not present

## 2014-10-14 DIAGNOSIS — Z6827 Body mass index (BMI) 27.0-27.9, adult: Secondary | ICD-10-CM | POA: Diagnosis not present

## 2014-10-14 DIAGNOSIS — I10 Essential (primary) hypertension: Secondary | ICD-10-CM | POA: Diagnosis not present

## 2014-10-14 DIAGNOSIS — M5136 Other intervertebral disc degeneration, lumbar region: Secondary | ICD-10-CM | POA: Diagnosis not present

## 2014-10-14 DIAGNOSIS — M5416 Radiculopathy, lumbar region: Secondary | ICD-10-CM | POA: Diagnosis not present

## 2014-10-14 DIAGNOSIS — Z981 Arthrodesis status: Secondary | ICD-10-CM | POA: Diagnosis not present

## 2014-10-14 DIAGNOSIS — M4726 Other spondylosis with radiculopathy, lumbar region: Secondary | ICD-10-CM | POA: Diagnosis not present

## 2014-11-08 ENCOUNTER — Encounter: Payer: Self-pay | Admitting: Internal Medicine

## 2014-11-08 ENCOUNTER — Ambulatory Visit (INDEPENDENT_AMBULATORY_CARE_PROVIDER_SITE_OTHER)
Admission: RE | Admit: 2014-11-08 | Discharge: 2014-11-08 | Disposition: A | Discharge status: Home / Self Care | Payer: Medicare Other | Source: Ambulatory Visit | Attending: Internal Medicine | Admitting: Internal Medicine

## 2014-11-08 ENCOUNTER — Ambulatory Visit (INDEPENDENT_AMBULATORY_CARE_PROVIDER_SITE_OTHER): Payer: Medicare Other | Admitting: Internal Medicine

## 2014-11-08 ENCOUNTER — Other Ambulatory Visit (INDEPENDENT_AMBULATORY_CARE_PROVIDER_SITE_OTHER): Payer: Medicare Other

## 2014-11-08 VITALS — BP 150/84 | HR 90 | Temp 98.2°F | Resp 18 | Ht 72.0 in | Wt 203.0 lb

## 2014-11-08 DIAGNOSIS — R1031 Right lower quadrant pain: Secondary | ICD-10-CM

## 2014-11-08 DIAGNOSIS — R252 Cramp and spasm: Secondary | ICD-10-CM

## 2014-11-08 DIAGNOSIS — I10 Essential (primary) hypertension: Secondary | ICD-10-CM

## 2014-11-08 DIAGNOSIS — R1030 Lower abdominal pain, unspecified: Secondary | ICD-10-CM

## 2014-11-08 DIAGNOSIS — R1032 Left lower quadrant pain: Secondary | ICD-10-CM

## 2014-11-08 DIAGNOSIS — M160 Bilateral primary osteoarthritis of hip: Secondary | ICD-10-CM | POA: Diagnosis not present

## 2014-11-08 LAB — COMPREHENSIVE METABOLIC PANEL
ALT: 16 U/L (ref 0–53)
AST: 18 U/L (ref 0–37)
Albumin: 3.7 g/dL (ref 3.5–5.2)
Alkaline Phosphatase: 62 U/L (ref 39–117)
BUN: 21 mg/dL (ref 6–23)
CO2: 27 mEq/L (ref 19–32)
Calcium: 9.3 mg/dL (ref 8.4–10.5)
Chloride: 104 mEq/L (ref 96–112)
Creatinine, Ser: 1 mg/dL (ref 0.40–1.50)
GFR: 76.18 mL/min (ref >60.00)
Glucose, Bld: 105 mg/dL — ABNORMAL HIGH (ref 70–99)
Potassium: 4.1 mEq/L (ref 3.5–5.1)
Sodium: 137 mEq/L (ref 135–145)
Total Bilirubin: 0.6 mg/dL (ref 0.2–1.2)
Total Protein: 6.7 g/dL (ref 6.0–8.3)

## 2014-11-08 LAB — CK: Total CK: 95 U/L (ref 7–232)

## 2014-11-08 LAB — MAGNESIUM: Magnesium: 2.1 mg/dL (ref 1.5–2.5)

## 2014-11-08 NOTE — Patient Instructions (Signed)
We will check on the electrolytes in the blood to see if there is a cause for the cramps. We are also checking an x-ray of the hips, pelvis to see if there is arthritis or shift that is causing the sudden pain.   We will call you back about the results. In the meantime keep using the pain medicine and you can try heating pads if you think it will help.

## 2014-11-08 NOTE — Assessment & Plan Note (Signed)
No recent trauma or injury to suggest fracture. Concern exists for degeneration given the groin pain. Check bilateral hip x-ray. Although he does not admit more strain with his wife's recent foot surgery his overall burden of work may be increased. Muscle strain is the second more likely etiology if no changes on x-ray. Have asked him to continue taking his hydrocodone and to use heating pads as well for the pain. If this does not resolve will ask him to go back to Dr. Sherwood Gambler to make sure this is not stemming from the lumbar spine (long history of degeneration and prior surgery to that region). No need for emergent imaging of the low pain with no numbness, incontinence.

## 2014-11-08 NOTE — Progress Notes (Signed)
Pre visit review using our clinic review tool, if applicable. No additional management support is needed unless otherwise documented below in the visit note. 

## 2014-11-08 NOTE — Assessment & Plan Note (Signed)
Was doing better on the multivitamin, repeat electrolytes today as well as CK.

## 2014-11-08 NOTE — Progress Notes (Signed)
   Subjective:    Patient ID: [name], male    DOB: 12-01-1931, 81 y.o.   MRN: [id number]  HPI The patient is an 81 YO man who is coming in for pelvic and thigh pain for 3 weeks. He is having pain in his groin with standing and bending. He has history of back surgery in his low back but not having the same burning radiating pain he previously had with that. This is sharp pain and 10/10. Taking some hydrocodone for pain which is helping (rxed for his back). He denies injury or strain although he has been taking care of his wife (she had foot surgery about 4-5 weeks ago). He is also still having some problems with cramps in his lower legs although less than before. He started taking a multivitamin and this helped quite a bit but back in the last 1-2 weeks. Has tried icing the area without relief. Tried drinking more fluids without relief.   Review of Systems  Constitutional: Positive for activity change. Negative for fever, appetite change, fatigue and unexpected weight change.  Respiratory: Negative for chest tightness and shortness of breath.   Cardiovascular: Negative for chest pain, palpitations and leg swelling.  Musculoskeletal: Positive for myalgias, arthralgias and gait problem.  Skin: Negative.   Neurological: Negative.   Psychiatric/Behavioral: Negative.       Objective:   Physical Exam  Constitutional: He is oriented to person, place, and time. He appears well-developed and well-nourished.  HENT:  Head: Normocephalic and atraumatic.  Neck: Normal range of motion.  Cardiovascular: Normal rate and regular rhythm.   Pulmonary/Chest: Effort normal.  Abdominal: Soft.  Musculoskeletal:  Pain in the thigh area to touch, pain in the groin and not in the lateral thigh area.   Neurological: He is alert and oriented to person, place, and time. Coordination abnormal.  Stiff and slow to rise from chair   Filed Vitals:   11/08/14 0930  BP: 150/84  Pulse: 90  Temp: 98.2 F (36.8  C)  TempSrc: Oral  Resp: 18  Height: 6' (1.829 m)  Weight: 203 lb (92.08 kg)  SpO2: 95%      Assessment & Plan:

## 2014-11-08 NOTE — Assessment & Plan Note (Signed)
Overall doing well on his regimen. Noted BP high today likely due to acute pain. Will recheck at next visit in May and if still elevated will adjust regimen. Continue coreg, terazosin, tribenzor.

## 2014-11-10 ENCOUNTER — Other Ambulatory Visit: Payer: Self-pay | Admitting: Family

## 2014-11-10 ENCOUNTER — Telehealth: Payer: Self-pay | Admitting: Internal Medicine

## 2014-11-10 NOTE — Telephone Encounter (Signed)
Spoke with patient about results of xray.   °

## 2014-11-10 NOTE — Telephone Encounter (Signed)
Pt return your call , please call pt back for the test result [phone]

## 2014-11-11 ENCOUNTER — Encounter: Payer: Self-pay | Admitting: Internal Medicine

## 2014-11-11 NOTE — Telephone Encounter (Signed)
Pt request a call from the assistant, he said he has a question about the test result. Please call [phone]

## 2014-11-12 NOTE — Telephone Encounter (Signed)
Patient took prednisone and he says he feels better. He does not need the visit with Dr. Tamala Julian.

## 2014-11-18 ENCOUNTER — Encounter: Payer: Self-pay | Admitting: Internal Medicine

## 2014-11-18 ENCOUNTER — Ambulatory Visit (INDEPENDENT_AMBULATORY_CARE_PROVIDER_SITE_OTHER): Payer: Medicare Other | Admitting: Internal Medicine

## 2014-11-18 ENCOUNTER — Other Ambulatory Visit (INDEPENDENT_AMBULATORY_CARE_PROVIDER_SITE_OTHER): Payer: Medicare Other

## 2014-11-18 VITALS — BP 130/72 | HR 73 | Temp 98.4°F | Ht 72.0 in | Wt 201.5 lb

## 2014-11-18 DIAGNOSIS — Z8739 Personal history of other diseases of the musculoskeletal system and connective tissue: Secondary | ICD-10-CM

## 2014-11-18 DIAGNOSIS — Z8639 Personal history of other endocrine, nutritional and metabolic disease: Secondary | ICD-10-CM

## 2014-11-18 DIAGNOSIS — IMO0001 Reserved for inherently not codable concepts without codable children: Secondary | ICD-10-CM

## 2014-11-18 DIAGNOSIS — M791 Myalgia: Secondary | ICD-10-CM

## 2014-11-18 DIAGNOSIS — M609 Myositis, unspecified: Secondary | ICD-10-CM

## 2014-11-18 LAB — SEDIMENTATION RATE: Sed Rate: 13 mm/hr (ref 0–22)

## 2014-11-18 LAB — CK: Total CK: 119 U/L (ref 7–232)

## 2014-11-18 LAB — POTASSIUM: Potassium: 4.1 mEq/L (ref 3.5–5.1)

## 2014-11-18 LAB — URIC ACID: Uric Acid, Serum: 6.5 mg/dL (ref 4.0–7.8)

## 2014-11-18 LAB — CALCIUM: Calcium: 9.4 mg/dL (ref 8.4–10.5)

## 2014-11-18 LAB — MAGNESIUM: Magnesium: 2.1 mg/dL (ref 1.5–2.5)

## 2014-11-18 MED ORDER — TIZANIDINE HCL 4 MG PO CAPS: 4.0000 mg | ORAL_CAPSULE | Freq: Three times a day (TID) | ORAL | Status: DC | PRN

## 2014-11-18 NOTE — Progress Notes (Signed)
Pre visit review using our clinic review tool, if applicable. No additional management support is needed unless otherwise documented below in the visit note. 

## 2014-11-18 NOTE — Progress Notes (Signed)
   Subjective:    Patient ID: [name], male    DOB: 1932-06-22, 81 y.o.   MRN: [id number]  HPI  He presents with pain in the posterior thighs bilaterally. Symptoms began 4/6-7 at approximately midnight as severe pain in the left posterior thigh area. It was exquisite pain up to level X ,described as knifelike. He applied ice without benefit. Symptoms lasted for at least 15 minutes. With stretching it did decrease slightly but then appeared on the right to a less severe degree. Now he has bilateral tightness in posterior thighs.  He is on magnesium approximately once a week for opioid related constipation.  He takes oxycodone 10 mg on average 10-15 milligrams twice a day for pain related to previous back surgery. In September 2014 after initial surgery screws came out requiring a second surgery. Since that time he's had pain in the left lower extremity in a L5-S1 distribution.   He is not on a statin.  He was seen 3/28 with pain in both inguinal areas and anterior thighs. Subsequent to this he developed acute gout of the right foot. He took prednisone 40 mg followed by 2 days of 20 mg until 4/4 with resolution.  Last uric acid on record was 4.6 in December 2014. Sedimentation rate at that time was 30  Review of Systems He denies any joint symptoms otherwise at this time. There's been no change in the color or temperature of the skin or associated rash.  He denies fever, chills, sweats, change in weight  The symptoms are not associated with weakness, numbness, tingling in the extremities. He has no urinary or stool incontinence.      Objective:   Physical Exam Pertinent or positive findings include:  He has arcus senilis.  There is slight flexion contracture of a single right finger.  He has ecchymosis over the right forearm.  Deep tendon reflexes are 0+ at the knees.  He has increased tone to compression of the left posterior thigh. Homans sign is negative bilaterally.  He  has large varicose veins left lower extremity.   General appearance :adequately nourished; in no distress. Eyes: No conjunctival inflammation or scleral icterus is present. Oral exam:  Lips and gums are healthy appearing.There is no oropharyngeal erythema or exudate noted. Dental hygiene is immaculate. Heart:  Normal rate and regular rhythm. S1 and S2 normal without gallop, murmur, click, rub or other extra sounds   Lungs:Chest clear to auscultation; no wheezes, rhonchi,rales ,or rubs present.No increased work of breathing.  Abdomen: bowel sounds normal, soft and non-tender without masses, organomegaly or hernias noted.  No guarding or rebound. No flank tenderness to percussion. Vascular : all pulses equal ; no bruits present. Skin:Warm & dry.  Intact without suspicious lesions or rashes ; no tenting or jaundice  Lymphatic: No lymphadenopathy is noted about the head, neck, axilla Neuro: Strength normal.         Assessment & Plan:  #1 severe myalgia of the posterior thighs #2 hx of gout #3 recent pelvic area pain; rule out polymyalgia rheumatica  Plan: See orders and recommendations

## 2014-11-18 NOTE — Patient Instructions (Addendum)
Use an anti-inflammatory cream such as Aspercreme or Zostrix cream twice a day to the affected area as needed. In lieu of this warm moist compresses or  hot water bottle can be used. Do not apply ice .  Perform isometric exercise for legs as discussed 10 X twice a day & the apply cream. Your next office appointment will be determined based upon review of your pending labs 7/or xrays  Those instructions will be transmitted to you by My Chart    Critical results will be called.   Followup as needed for any active or acute issue. Please report any significant change in your symptoms.

## 2014-11-19 ENCOUNTER — Other Ambulatory Visit: Payer: Self-pay | Admitting: *Deleted

## 2014-11-23 ENCOUNTER — Other Ambulatory Visit: Payer: Self-pay | Admitting: Geriatric Medicine

## 2014-11-23 MED ORDER — TRIAMCINOLONE ACETONIDE 0.5 % EX CREA: TOPICAL_CREAM | CUTANEOUS | Status: DC

## 2014-11-26 ENCOUNTER — Encounter: Payer: Self-pay | Admitting: Internal Medicine

## 2014-11-29 ENCOUNTER — Encounter: Payer: Self-pay | Admitting: Internal Medicine

## 2014-11-29 MED ORDER — PREDNISONE 10 MG PO TABS: 10.0000 mg | ORAL_TABLET | Freq: Every day | ORAL | Status: DC

## 2014-11-30 ENCOUNTER — Encounter: Payer: Self-pay | Admitting: Internal Medicine

## 2014-12-10 ENCOUNTER — Other Ambulatory Visit: Payer: Self-pay | Admitting: Internal Medicine

## 2014-12-10 MED ORDER — CARVEDILOL 25 MG PO TABS: 25.0000 mg | ORAL_TABLET | Freq: Two times a day (BID) | ORAL | Status: DC

## 2014-12-10 NOTE — Addendum Note (Signed)
Addended by: Vertell Novak A on: 12/10/2014 01:02 PM   Modules accepted: Orders

## 2014-12-16 ENCOUNTER — Ambulatory Visit (INDEPENDENT_AMBULATORY_CARE_PROVIDER_SITE_OTHER): Payer: Medicare Other | Admitting: Internal Medicine

## 2014-12-16 ENCOUNTER — Encounter: Payer: Self-pay | Admitting: Internal Medicine

## 2014-12-16 VITALS — BP 122/66 | HR 86 | Temp 98.3°F | Resp 18 | Ht 72.0 in | Wt 199.1 lb

## 2014-12-16 DIAGNOSIS — I10 Essential (primary) hypertension: Secondary | ICD-10-CM

## 2014-12-16 DIAGNOSIS — R1310 Dysphagia, unspecified: Secondary | ICD-10-CM

## 2014-12-16 DIAGNOSIS — M791 Myalgia: Secondary | ICD-10-CM | POA: Diagnosis not present

## 2014-12-16 DIAGNOSIS — Z23 Encounter for immunization: Secondary | ICD-10-CM

## 2014-12-16 DIAGNOSIS — IMO0001 Reserved for inherently not codable concepts without codable children: Secondary | ICD-10-CM

## 2014-12-16 DIAGNOSIS — M609 Myositis, unspecified: Secondary | ICD-10-CM

## 2014-12-16 MED ORDER — LOSARTAN POTASSIUM 50 MG PO TABS
50.0000 mg | ORAL_TABLET | Freq: Every day | ORAL | Status: DC
Start: 2014-12-16 — End: 2015-07-09

## 2014-12-16 MED ORDER — PREDNISONE 10 MG PO TABS: 10.0000 mg | ORAL_TABLET | Freq: Every day | ORAL | Status: DC

## 2014-12-16 MED ORDER — AMLODIPINE BESYLATE 5 MG PO TABS: 5.0000 mg | ORAL_TABLET | Freq: Every day | ORAL | Status: DC

## 2014-12-16 NOTE — Progress Notes (Signed)
Pre visit review using our clinic review tool, if applicable. No additional management support is needed unless otherwise documented below in the visit note. 

## 2014-12-16 NOTE — Patient Instructions (Addendum)
We will have you stop the tribenzor. Instead we have sent in two medicines which were part of the tribenzor. Take 1 pill of the amlodipine daily and 1 pill of the losartan daily. We have taken away the fluid pill which may have been worsening your leg cramps.   We will send you to the GI doctor to check out the swallowing problem.  We have refilled the prednisone in case you need it for the severe muscle pains.

## 2014-12-17 ENCOUNTER — Ambulatory Visit: Payer: Medicare Other | Admitting: Internal Medicine

## 2014-12-17 ENCOUNTER — Encounter: Payer: Self-pay | Admitting: Internal Medicine

## 2014-12-17 DIAGNOSIS — Z23 Encounter for immunization: Secondary | ICD-10-CM | POA: Diagnosis not present

## 2014-12-19 DIAGNOSIS — R1310 Dysphagia, unspecified: Secondary | ICD-10-CM | POA: Insufficient documentation

## 2014-12-19 NOTE — Assessment & Plan Note (Signed)
He does have dysphagia with solids and liquids. Refer to GI for EGD to evaluate and possible dilation. No weight loss, GERD, red flag signs.

## 2014-12-19 NOTE — Assessment & Plan Note (Signed)
Will stop his HCTZ for concern it is causing cramps. Continue on amlodipine, losartan, and coreg. Recheck BP for worsening at next visit and still room to increase his current therapy.

## 2014-12-19 NOTE — Assessment & Plan Note (Signed)
Previous labs unremarkable. Tizanidine mildly helpful but makes him sleepy so he does not take. Will stop tribenzor and start back the amlodipine and the losartan. Stop HCTZ which could be worsening the cramps. He is not on a statin. Talked to him about making sure to stay well hydrated and he will drink 6-8 glasses of water daily.

## 2014-12-19 NOTE — Progress Notes (Signed)
   Subjective:    Patient ID: [name], male    DOB: April 22, 1932, 81 y.o.   MRN: [id number]  HPI The patient is coming in for two reasons. He is here to follow up on his cramps which are still present. They are doing better but can be debilitating when they come. He has tried tylenol or ibuprofen OTC for pain which help some but not a lot. They can hurt all day after a flare. Nothing he seems to do helps. He does not drink a ton of liquids. He takes his blood pressure medication. The other problem is for dysphagia. He has had it for some time but never sought care for it. Can happen with liquids or solids but more often for solids. Denies weight loss or regurgitation of foods. Sometimes he does gag food back up. Has been mildly progressive but fairly stable.   Review of Systems  Constitutional: Positive for activity change. Negative for fever, appetite change, fatigue and unexpected weight change.  Respiratory: Negative for chest tightness and shortness of breath.   Cardiovascular: Negative for chest pain, palpitations and leg swelling.  Gastrointestinal: Negative for nausea, abdominal pain, diarrhea, constipation and abdominal distention.       Dysphagia, see hpi  Musculoskeletal: Positive for myalgias, arthralgias and gait problem.  Skin: Negative.   Neurological: Negative.   Psychiatric/Behavioral: Negative.       Objective:   Physical Exam  Constitutional: He is oriented to person, place, and time. He appears well-developed and well-nourished.  HENT:  Head: Normocephalic and atraumatic.  Neck: Normal range of motion.  Cardiovascular: Normal rate and regular rhythm.   Pulmonary/Chest: Effort normal.  Abdominal: Soft.  Neurological: He is alert and oriented to person, place, and time. Coordination abnormal.  Stiff and slow to rise from chair  Skin: Skin is warm and dry.   Filed Vitals:   12/16/14 1335  BP: 122/66  Pulse: 86  Temp: 98.3 F (36.8 C)  TempSrc: Oral  Resp: 18   Height: 6' (1.829 m)  Weight: 199 lb 1.9 oz (90.32 kg)  SpO2: 95%      Assessment & Plan:  Prevnar 13 given.

## 2014-12-20 ENCOUNTER — Ambulatory Visit (INDEPENDENT_AMBULATORY_CARE_PROVIDER_SITE_OTHER): Payer: Medicare Other | Admitting: Gastroenterology

## 2014-12-20 ENCOUNTER — Encounter: Payer: Self-pay | Admitting: Gastroenterology

## 2014-12-20 VITALS — BP 150/76 | HR 72

## 2014-12-20 DIAGNOSIS — R1314 Dysphagia, pharyngoesophageal phase: Secondary | ICD-10-CM | POA: Insufficient documentation

## 2014-12-20 DIAGNOSIS — Z1211 Encounter for screening for malignant neoplasm of colon: Secondary | ICD-10-CM | POA: Diagnosis not present

## 2014-12-20 DIAGNOSIS — R1310 Dysphagia, unspecified: Secondary | ICD-10-CM

## 2014-12-20 NOTE — Assessment & Plan Note (Signed)
6-8 month history of progressive dysphagia to solids.  Suspect peptic esophageal stricture.  Latest stricture less likely.  Recommendations #1 EGD with dilation  Risks, alternatives, and complications of the procedure, including bleeding, perforation, and possible need for surgery, were explained to the patient.    Patient's questions were answered.  Cc Dr. Doug Sou

## 2014-12-20 NOTE — Assessment & Plan Note (Signed)
Patient has never had a colonoscopy.  Screening colonoscopy recommended.  He will take this under advisement.

## 2014-12-20 NOTE — Patient Instructions (Signed)

## 2014-12-20 NOTE — Progress Notes (Signed)
_                                                                                                                History of Present Illness:  [name] is a pleasant 81 year old white male referred at the request of Dr.Kollar for evaluation of dysphagia.  Over the past 6-8 months he's had progressive dysphagia to solids and now liquids.  He denies odynophagia or pyrosis.  Weight has been stable.  Patient has never had a colonoscopy.  He has no lower GI symptoms.   Past Medical History  Diagnosis Date  . ACTINIC KERATOSIS, HEAD 03/23/2008  . ALLERGIC RHINITIS 03/27/2007  . BENIGN PROSTATIC HYPERTROPHY, WITH URINARY OBSTRUCTION 10/16/2007  . CARPAL TUNNEL SYNDROME, LEFT 04/13/2008  . ERECTILE DYSFUNCTION, ORGANIC 02/20/2010  . Gout, unspecified 02/20/2010  . HYPERLIPIDEMIA, WITH HIGH HDL 10/18/2008  . KNEE PAIN 10/10/2009  . LOW BACK PAIN 03/27/2007  . NEOPLASM, MALIGNANT, SKIN, TRUNK 05/09/2007  . Patellar tendinitis 10/10/2009  . SKIN CANCER, HX OF 03/27/2007  . UNS ADVRS EFF UNS RX MEDICINAL&BIOLOGICAL SBSTNC 08/18/2007  . HYPERTENSION 05/09/2007    sees Dr. Benay Pillow  . Headache(784.0)     migraines hx of  . Chronic neck pain   . Chronic back pain   . CAD 08/18/2007    "patient denies any issues with heart, does not see cardiologist"  . Urinary urgency   . History of melanoma   . Arthritis   . DVT (deep venous thrombosis)    Past Surgical History  Procedure Laterality Date  . Knee arthroscopy    . Cholecystectomy    . Nasal sinus surgery    . Vasectomy    . Lumbar fusions    . Joint replacement surgery    . Rectal fissure    . Lateral fusion lumbar spine, transverse    . Laminectomy    . Lumbar fusion    . Rectal fissure    . Vitrectomy      right and left 2013  . Carpal tunnel release    . Eye surgery      bilateral cataracts removed and vitrectomies  . Blepharoplasty    . Back surgery    . Posterior cervical fusion/foraminotomy Right 10/01/2012   Procedure: POSTERIOR CERVICAL FUSION/FORAMINOTOMY LEVEL 1;  Surgeon: Hosie Spangle, MD;  Location: [facility];  Service: Neurosurgery;  Laterality: Right;  Right Cervical seven thoracic one Cervical laminectomy/foraminotomy with posterior cervical arthrodesis   . Joint replacement Right     knee  . Appendectomy  1969  . Lumbar laminectomy/decompression microdiscectomy Left 11/03/2012    Procedure: LUMBAR LAMINECTOMY/DECOMPRESSION MICRODISCECTOMY 1 LEVEL;  Surgeon: Hosie Spangle, MD;  Location: [facility];  Service: Neurosurgery;  Laterality: Left;  LEFT L5S1 laminotomy foraminotomy and possible microdiskectomy  . Melanoma excision    . Posterior lumbar fusion N/A 05/23/2013    Procedure: exploration of lumbar wound, explantation of left interbody implant.;  Surgeon: Hosie Spangle, MD;  Location: [facility];  Service: Neurosurgery;  Laterality: N/A;   family history includes Anemia in his father; Dementia in his mother; Heart disease in his father. Current Outpatient Prescriptions  Medication Sig Dispense Refill  . amLODipine (NORVASC) 5 MG tablet Take 1 tablet (5 mg total) by mouth daily. 30 tablet 6  . carvedilol (COREG) 25 MG tablet Take 1 tablet (25 mg total) by mouth 2 (two) times daily with a meal. 60 tablet 11  . losartan (COZAAR) 50 MG tablet Take 1 tablet (50 mg total) by mouth daily. 30 tablet 6  . magnesium hydroxide (MILK OF MAGNESIA) 800 MG/5ML suspension Take 15 mLs by mouth every other day.    . Oxycodone HCl 10 MG TABS Take 1 to 2 tablets by mouth every 12 hours for pain  0  . predniSONE (DELTASONE) 10 MG tablet Take 1 tablet (10 mg total) by mouth daily with breakfast. 30 tablet 2  . tiZANidine (ZANAFLEX) 4 MG capsule Take 1 capsule (4 mg total) by mouth 3 (three) times daily as needed for muscle spasms. 21 capsule 0  . triamcinolone cream (KENALOG) 0.5 % USE AS DIRECTED 60 g 2   No current facility-administered medications for this visit.   Allergies as of  12/20/2014 - Review Complete 12/20/2014  Allergen Reaction Noted  . Gabapentin  03/16/2013    reports that he quit smoking about 33 years ago. He does not have any smokeless tobacco history on file. He reports that he drinks about 1.8 oz of alcohol per week. He reports that he does not use illicit drugs.   Review of Systems: He has numbness in his left foot with radiation of pain since she's had back surgery.  Pertinent positive and negative review of systems were noted in the above HPI section. All other review of systems were otherwise negative.  Vital signs were reviewed in today's medical record Physical Exam: General: Well developed , well nourished appearing younger than his stated age Skin: anicteric Head: Normocephalic and atraumatic Eyes:  sclerae anicteric, EOMI Ears: Normal auditory acuity Mouth: No deformity or lesions Neck: Supple, no masses or thyromegaly Lymph Nodes: no lymphadenopathy Lungs: Clear throughout to auscultation Heart: Regular rate and rhythm; no murmurs, rubs or bruits Gastroinestinal: Soft, non tender and non distended. No masses, hepatosplenomegaly or hernias noted. Normal Bowel sounds Rectal:deferred Musculoskeletal: Symmetrical with no gross deformities  Skin: No lesions on visible extremities Pulses:  Normal pulses noted Extremities: No clubbing, cyanosis, edema or deformities noted Neurological: Alert oriented x 4, grossly nonfocal Cervical Nodes:  No significant cervical adenopathy Inguinal Nodes: No significant inguinal adenopathy Psychological:  Alert and cooperative. Normal mood and affect  See Assessment and Plan under Problem List

## 2014-12-21 ENCOUNTER — Ambulatory Visit (AMBULATORY_SURGERY_CENTER): Payer: Medicare Other | Admitting: Gastroenterology

## 2014-12-21 ENCOUNTER — Encounter: Payer: Self-pay | Admitting: Gastroenterology

## 2014-12-21 VITALS — BP 126/66 | HR 64 | Temp 97.8°F | Resp 37 | Ht 72.0 in | Wt 199.0 lb

## 2014-12-21 DIAGNOSIS — K209 Esophagitis, unspecified without bleeding: Secondary | ICD-10-CM

## 2014-12-21 DIAGNOSIS — K222 Esophageal obstruction: Secondary | ICD-10-CM

## 2014-12-21 DIAGNOSIS — R1310 Dysphagia, unspecified: Secondary | ICD-10-CM

## 2014-12-21 DIAGNOSIS — I2510 Atherosclerotic heart disease of native coronary artery without angina pectoris: Secondary | ICD-10-CM | POA: Diagnosis not present

## 2014-12-21 MED ORDER — SODIUM CHLORIDE 0.9 % IV SOLN: 500.0000 mL | INTRAVENOUS | Status: DC

## 2014-12-21 MED ORDER — OMEPRAZOLE 20 MG PO CPDR: 20.0000 mg | DELAYED_RELEASE_CAPSULE | Freq: Every day | ORAL | Status: DC

## 2014-12-21 NOTE — Progress Notes (Signed)
Pt expressesed concern about whether anesthesia would be using an airway since dental work had previously been damaged during a surgery he had. Passed this information on to [place] when pt picked up to go to procedure rm.

## 2014-12-21 NOTE — Patient Instructions (Addendum)
YOU HAD AN ENDOSCOPIC PROCEDURE TODAY AT [facility]:   Refer to the procedure report that was given to you for any specific questions about what was found during the examination.  If the procedure report does not answer your questions, please call your gastroenterologist to clarify.  If you requested that your care partner not be given the details of your procedure findings, then the procedure report has been included in a sealed envelope for you to review at your convenience later.  YOU SHOULD EXPECT: Some feelings of bloating in the abdomen. Passage of more gas than usual.  Walking can help get rid of the air that was put into your GI tract during the procedure and reduce the bloating. If you had a lower endoscopy (such as a colonoscopy or flexible sigmoidoscopy) you may notice spotting of blood in your stool or on the toilet paper. If you underwent a bowel prep for your procedure, you may not have a normal bowel movement for a few days.  Please Note:  You might notice some irritation and congestion in your nose or some drainage.  This is from the oxygen used during your procedure.  There is no need for concern and it should clear up in a day or so.  SYMPTOMS TO REPORT IMMEDIATELY:     Following upper endoscopy (EGD)  Vomiting of blood or coffee ground material  New chest pain or pain under the shoulder blades  Painful or persistently difficult swallowing  New shortness of breath  Fever of 100F or higher  Black, tarry-looking stools  For urgent or emergent issues, a gastroenterologist can be reached at any hour by calling [phone].   DIET:  Drink plenty of fluids but you should avoid alcoholic beverages for 24 hours.  Please follow the dilatation diet the rest of the day.  Handout given to care partner.  ACTIVITY:  You should plan to take it easy for the rest of today and you should NOT DRIVE or use heavy machinery until tomorrow (because of the sedation medicines  used during the test).    FOLLOW UP: Our staff will call the number listed on your records the next business day following your procedure to check on you and address any questions or concerns that you may have regarding the information given to you following your procedure. If we do not reach you, we will leave a message.  However, if you are feeling well and you are not experiencing any problems, there is no need to return our call.  We will assume that you have returned to your regular daily activities without incident.  If any biopsies were taken you will be contacted by phone or by letter within the next 1-3 weeks.  Please call us at [phone] if you have not heard about the biopsies in 3 weeks.    SIGNATURES/CONFIDENTIALITY: You and/or your care partner have signed paperwork which will be entered into your electronic medical record.  These signatures attest to the fact that that the information above on your After Visit Summary has been reviewed and is understood.  Full responsibility of the confidentiality of this discharge information lies with you and/or your care-partner.    Handouts were given to your care partner on esophagitis, esophageal stricture, GERD, and esophageal dilatation diet to follow the rest of the day. You may resume your current medications today.  Prescription was sent to [facility] for omeprazole 20 mg daily. Please call if any questions or concerns.

## 2014-12-21 NOTE — Progress Notes (Signed)
A/ox3 pleased with MAC, report to Annette RN 

## 2014-12-21 NOTE — Op Note (Signed)
[place]  [address] [place], [address]   ENDOSCOPY PROCEDURE REPORT  PATIENT: [name], Jordan  MR#: [id number] BIRTHDATE: July 03, 1932 , 83  yrs. old GENDER: male ENDOSCOPIST: Inda Castle, MD REFERRED BY:  Vertell Novak, M.D. PROCEDURE DATE:  12/21/2014 PROCEDURE:  EGD, diagnostic and Maloney dilation of esophagus ASA CLASS:     Class II INDICATIONS:  dysphagia. MEDICATIONS: Monitored anesthesia care and Propofol 140 mg IV TOPICAL ANESTHETIC:  DESCRIPTION OF PROCEDURE: After the risks benefits and alternatives of the procedure were thoroughly explained, informed consent was obtained.  The LB [id number] endoscope was introduced through the mouth and advanced to the second portion of the duodenum , Without limitations.  The instrument was slowly withdrawn as the mucosa was fully examined.    ESOPHAGUS: There was a peptic stricture at the gastroesophageal junction.  The stricture was traversable.  The stricture was dilated using a 35mm (54Fr) Maloney dilator.   There was LA Class B esophagitis (One or more mucosal breaks > 72mm, but without continuity across mucosal folds) noted.   Except for the findings listed, the EGD was otherwise normal.  Retroflexed views revealed no abnormalities.     The scope was then withdrawn from the patient and the procedure completed.  COMPLICATIONS: There were no immediate complications.  ENDOSCOPIC IMPRESSION: 1.   There was a stricture at the gastroesophageal junction; The stricture was dilated using a 41mm (54Fr) Maloney dilator 2.   There was LA Class B esophagitis noted 3.   EGD was otherwise normal  RECOMMENDATIONS: Begin omeprazole 20 mg daily Repeat dilation as needed  REPEAT EXAM:  eSigned:  Inda Castle, MD 12/21/2014 9:50 AM    CC:

## 2014-12-21 NOTE — Progress Notes (Signed)
Called to room to assist during endoscopic procedure.  Patient ID and intended procedure confirmed with present staff. Received instructions for my participation in the procedure from the performing physician.  

## 2014-12-21 NOTE — Progress Notes (Signed)
No problems noted in the recovery room. maw 

## 2014-12-22 ENCOUNTER — Telehealth: Payer: Self-pay | Admitting: *Deleted

## 2014-12-22 NOTE — Telephone Encounter (Signed)
  Follow up Call-  Call back number 12/21/2014  Post procedure Call Back phone  # [phone]  Permission to leave phone message Yes     Patient questions:  Do you have a fever, pain , or abdominal swelling? No. Pain Score  0 *  Have you tolerated food without any problems? Yes.    Have you been able to return to your normal activities? Yes.    Do you have any questions about your discharge instructions: Diet   No. Medications  No. Follow up visit  No.  Do you have questions or concerns about your Care? No.  Actions: * If pain score is 4 or above: No action needed, pain <4.

## 2015-02-20 ENCOUNTER — Encounter: Payer: Self-pay | Admitting: Internal Medicine

## 2015-02-21 ENCOUNTER — Encounter: Payer: Self-pay | Admitting: Internal Medicine

## 2015-02-21 ENCOUNTER — Ambulatory Visit (INDEPENDENT_AMBULATORY_CARE_PROVIDER_SITE_OTHER): Payer: Medicare Other | Admitting: Internal Medicine

## 2015-02-21 VITALS — BP 128/78 | HR 77 | Temp 97.9°F | Resp 11 | Ht 72.0 in | Wt 202.0 lb

## 2015-02-21 DIAGNOSIS — R1310 Dysphagia, unspecified: Secondary | ICD-10-CM | POA: Diagnosis not present

## 2015-02-21 DIAGNOSIS — K5900 Constipation, unspecified: Secondary | ICD-10-CM | POA: Insufficient documentation

## 2015-02-21 DIAGNOSIS — N400 Enlarged prostate without lower urinary tract symptoms: Secondary | ICD-10-CM | POA: Insufficient documentation

## 2015-02-21 DIAGNOSIS — K5909 Other constipation: Secondary | ICD-10-CM | POA: Diagnosis not present

## 2015-02-21 MED ORDER — NALOXEGOL OXALATE 25 MG PO TABS: 25.0000 mg | ORAL_TABLET | Freq: Every day | ORAL | Status: DC

## 2015-02-21 MED ORDER — PREDNISONE 10 MG PO TABS: 10.0000 mg | ORAL_TABLET | Freq: Every day | ORAL | Status: DC

## 2015-02-21 MED ORDER — TAMSULOSIN HCL 0.4 MG PO CAPS: 0.4000 mg | ORAL_CAPSULE | Freq: Every day | ORAL | Status: DC

## 2015-02-21 NOTE — Assessment & Plan Note (Signed)
Will forward note to Dr. Deatra Ina, he had improvement in his symptoms for about 1 week after procedure and is now having problems again and wants to know the next step.

## 2015-02-21 NOTE — Patient Instructions (Signed)
We have sent in two new medicines for you today.   One is called movantik and it is for the stomach and the constipation. It is a medicine you take once a day that blocks the pain medicine in the gut only. The pain medicine slows down the gut and causes the constipation so this medicine blocks that and the gut should move normally. Keep working on taking in enough fiber and water to help the bowels. Try to drink 5 glasses of water a day to give the gut enough water.   The next medicine is called flomax (tamsulosin) which is a medicine that relaxes the prostate. This allows you to not have the problems with the bladder and you should notice not going to the bathroom at night as often. It make take up to 1 month for you to notice the full benefit of the medicine.   If you have any problems or questions please feel free to call the office or send [place] a message.  High-Fiber Diet Fiber is found in fruits, vegetables, and grains. A high-fiber diet encourages the addition of more whole grains, legumes, fruits, and vegetables in your diet. The recommended amount of fiber for adult males is 38 g per day. For adult females, it is 25 g per day. Pregnant and lactating women should get 28 g of fiber per day. If you have a digestive or bowel problem, ask your caregiver for advice before adding high-fiber foods to your diet. Eat a variety of high-fiber foods instead of only a select few type of foods.  PURPOSE  To increase stool bulk.  To make bowel movements more regular to prevent constipation.  To lower cholesterol.  To prevent overeating. WHEN IS THIS DIET USED?  It may be used if you have constipation and hemorrhoids.  It may be used if you have uncomplicated diverticulosis (intestine condition) and irritable bowel syndrome.  It may be used if you need help with weight management.  It may be used if you want to add it to your diet as a protective measure against atherosclerosis, diabetes, and  cancer. SOURCES OF FIBER  Whole-grain breads and cereals.  Fruits, such as apples, oranges, bananas, berries, prunes, and pears.  Vegetables, such as green peas, carrots, sweet potatoes, beets, broccoli, cabbage, spinach, and artichokes.  Legumes, such split peas, soy, lentils.  Almonds. FIBER CONTENT IN FOODS Starches and Grains / Dietary Fiber (g)  Cheerios, 1 cup / 3 g  Corn Flakes cereal, 1 cup / 0.7 g  Rice crispy treat cereal, 1 cup / 0.3 g  Instant oatmeal (cooked),  cup / 2 g  Frosted wheat cereal, 1 cup / 5.1 g  Brown, long-grain rice (cooked), 1 cup / 3.5 g  White, long-grain rice (cooked), 1 cup / 0.6 g  Enriched macaroni (cooked), 1 cup / 2.5 g Legumes / Dietary Fiber (g)  Baked beans (canned, plain, or vegetarian),  cup / 5.2 g  Kidney beans (canned),  cup / 6.8 g  Pinto beans (cooked),  cup / 5.5 g Breads and Crackers / Dietary Fiber (g)  Plain or honey graham crackers, 2 squares / 0.7 g  Saltine crackers, 3 squares / 0.3 g  Plain, salted pretzels, 10 pieces / 1.8 g  Whole-wheat bread, 1 slice / 1.9 g  White bread, 1 slice / 0.7 g  Raisin bread, 1 slice / 1.2 g  Plain bagel, 3 oz / 2 g  Flour tortilla, 1 oz / 0.9 g  Corn tortilla, 1 small / 1.5 g  Hamburger or hotdog bun, 1 small / 0.9 g Fruits / Dietary Fiber (g)  Apple with skin, 1 medium / 4.4 g  Sweetened applesauce,  cup / 1.5 g  Banana,  medium / 1.5 g  Grapes, 10 grapes / 0.4 g  Orange, 1 small / 2.3 g  Raisin, 1.5 oz / 1.6 g  Melon, 1 cup / 1.4 g Vegetables / Dietary Fiber (g)  Green beans (canned),  cup / 1.3 g  Carrots (cooked),  cup / 2.3 g  Broccoli (cooked),  cup / 2.8 g  Peas (cooked),  cup / 4.4 g  Mashed potatoes,  cup / 1.6 g  Lettuce, 1 cup / 0.5 g  Corn (canned),  cup / 1.6 g  Tomato,  cup / 1.1 g Document Released: 07/30/2005 Document Revised: 01/29/2012 Document Reviewed: 11/01/2011 Exit[employer] Patient Information 2015 [place],  [place]. This information is not intended to replace advice given to you by your health care provider. Make sure you discuss any questions you have with your health care provider.

## 2015-02-21 NOTE — Assessment & Plan Note (Signed)
Due to opioid usage. Will try movantik to block the mu receptors in the gut. Has tried and failed 2 OTC medications.

## 2015-02-21 NOTE — Assessment & Plan Note (Signed)
Starting flomax 0.4 mg daily. Advised that it can take up to 4 weeks for improvement of symptoms.

## 2015-02-21 NOTE — Progress Notes (Signed)
Pre visit review using our clinic review tool, if applicable. No additional management support is needed unless otherwise documented below in the visit note. 

## 2015-02-21 NOTE — Progress Notes (Signed)
   Subjective:    Patient ID: [name], male    DOB: October 06, 1931, 81 y.o.   MRN: [id number]  HPI The patient is an 81 YO man who is here for two reasons: first is constipation caused by his pain medicine. He has pulled a back muscle straining to pass stools. Uses magnesium citrate occasionally which causes only water to pass. He has been trying colace over the counter but this does not help much. Takes metamucil daily and drinks water.  Second reason is frequent urination at night time and poor stream. Hard to start and stop urine. Weak stream. No pain with urination or burning. No fevers or chills. Worsening over the last several years and now is bothersome enough to try treatment.   Review of Systems  Constitutional: Positive for activity change. Negative for fever, appetite change, fatigue and unexpected weight change.  Respiratory: Negative for chest tightness and shortness of breath.   Cardiovascular: Negative for chest pain, palpitations and leg swelling.  Gastrointestinal: Positive for constipation. Negative for nausea, abdominal pain, diarrhea and abdominal distention.  Genitourinary: Positive for frequency and enuresis.  Musculoskeletal: Positive for myalgias, arthralgias and gait problem.  Skin: Negative.   Neurological: Negative.   Psychiatric/Behavioral: Negative.       Objective:   Physical Exam  Constitutional: He is oriented to person, place, and time. He appears well-developed and well-nourished.  HENT:  Head: Normocephalic and atraumatic.  Neck: Normal range of motion.  Cardiovascular: Normal rate and regular rhythm.   Pulmonary/Chest: Effort normal.  Abdominal: Soft.  Neurological: He is alert and oriented to person, place, and time. Coordination normal.  Skin: Skin is warm and dry.   Filed Vitals:   02/21/15 0842  BP: 128/78  Pulse: 77  Temp: 97.9 F (36.6 C)  TempSrc: Oral  Resp: 11  Height: 6' (1.829 m)  Weight: 202 lb (91.627 kg)  SpO2: 94%        Assessment & Plan:

## 2015-02-22 ENCOUNTER — Encounter: Payer: Self-pay | Admitting: Internal Medicine

## 2015-02-24 ENCOUNTER — Encounter: Payer: Self-pay | Admitting: Internal Medicine

## 2015-02-27 ENCOUNTER — Encounter: Payer: Self-pay | Admitting: Internal Medicine

## 2015-03-01 ENCOUNTER — Telehealth: Payer: Self-pay

## 2015-03-01 NOTE — Telephone Encounter (Signed)
Movantik PA initiated via [employer]. A decision will be faxed over.

## 2015-03-02 ENCOUNTER — Encounter: Payer: Self-pay | Admitting: Internal Medicine

## 2015-03-03 ENCOUNTER — Encounter: Payer: Self-pay | Admitting: Internal Medicine

## 2015-03-07 NOTE — Telephone Encounter (Signed)
Pa approved. Pharmacy notified 

## 2015-03-08 ENCOUNTER — Other Ambulatory Visit: Payer: Self-pay | Admitting: Internal Medicine

## 2015-03-08 NOTE — Telephone Encounter (Signed)
Is this something he needs to be taking on an ongoing basis?

## 2015-03-08 NOTE — Telephone Encounter (Signed)
This was just sent in on 02/21/15 and he should not be taking ongoing. This is only for flares he has. Should not need refill now and will decline.

## 2015-03-16 ENCOUNTER — Encounter: Payer: Self-pay | Admitting: Gastroenterology

## 2015-03-17 ENCOUNTER — Encounter: Payer: Self-pay | Admitting: Internal Medicine

## 2015-03-21 DIAGNOSIS — H35372 Puckering of macula, left eye: Secondary | ICD-10-CM | POA: Diagnosis not present

## 2015-03-21 DIAGNOSIS — Z961 Presence of intraocular lens: Secondary | ICD-10-CM | POA: Diagnosis not present

## 2015-03-21 DIAGNOSIS — H26491 Other secondary cataract, right eye: Secondary | ICD-10-CM | POA: Diagnosis not present

## 2015-03-22 ENCOUNTER — Ambulatory Visit (INDEPENDENT_AMBULATORY_CARE_PROVIDER_SITE_OTHER): Payer: Medicare Other | Admitting: Gastroenterology

## 2015-03-22 ENCOUNTER — Encounter: Payer: Self-pay | Admitting: Gastroenterology

## 2015-03-22 VITALS — BP 164/74 | HR 87 | Ht 72.0 in | Wt 202.4 lb

## 2015-03-22 DIAGNOSIS — R1310 Dysphagia, unspecified: Secondary | ICD-10-CM | POA: Diagnosis not present

## 2015-03-22 NOTE — Assessment & Plan Note (Signed)
Symptoms are out of proportion to findings at endoscopy.  This raises the question of a motility disorder, especially in the face of dysphagia to solids and liquids.  Recommendations #1 barium swallow

## 2015-03-22 NOTE — Progress Notes (Signed)
      History of Present Illness:  [name] continues to complain of dysphagia to both liquids and solids.  Upper endoscopy demonstrated an early distal esophageal stricture which was dilated to 18 mm.  He had improvement for only one week.  He is now back to baseline.    Review of Systems: Pertinent positive and negative review of systems were noted in the above HPI section. All other review of systems were otherwise negative.    Current Medications, Allergies, Past Medical History, Past Surgical History, Family History and Social History were reviewed in [facility] [place] record  Vital signs were reviewed in today's medical record. Physical Exam: General: Well developed , well nourished, no acute distress  See Assessment and Plan under Problem List

## 2015-03-22 NOTE — Patient Instructions (Signed)
You have been scheduled for a Barium Esophogram at [facility] (1st floor of the hospital) on 03/28/2015 at 10:30am. Please arrive 15 minutes prior to your appointment for registration. Make certain not to have anything to eat or drink 6 hours prior to your test. If you need to reschedule for any reason, please contact radiology at [phone] to do so. __________________________________________________________________ A barium swallow is an examination that concentrates on views of the esophagus. This tends to be a double contrast exam (barium and two liquids which, when combined, create a gas to distend the wall of the oesophagus) or single contrast (non-ionic iodine based). The study is usually tailored to your symptoms so a good history is essential. Attention is paid during the study to the form, structure and configuration of the esophagus, looking for functional disorders (such as aspiration, dysphagia, achalasia, motility and reflux) EXAMINATION You may be asked to change into a gown, depending on the type of swallow being performed. A radiologist and radiographer will perform the procedure. The radiologist will advise you of the type of contrast selected for your procedure and direct you during the exam. You will be asked to stand, sit or lie in several different positions and to hold a small amount of fluid in your mouth before being asked to swallow while the imaging is performed .In some instances you may be asked to swallow barium coated marshmallows to assess the motility of a solid food bolus. The exam can be recorded as a digital or video fluoroscopy procedure. POST PROCEDURE It will take 1-2 days for the barium to pass through your system. To facilitate this, it is important, unless otherwise directed, to increase your fluids for the next 24-48hrs and to resume your normal diet.  This test typically takes about 30 minutes to  perform. __________________________________________________________________________________

## 2015-03-28 ENCOUNTER — Ambulatory Visit (HOSPITAL_COMMUNITY)
Admission: RE | Admit: 2015-03-28 | Discharge: 2015-03-28 | Disposition: A | Discharge status: Home / Self Care | Payer: Medicare Other | Source: Ambulatory Visit | Attending: Gastroenterology | Admitting: Gastroenterology

## 2015-03-28 ENCOUNTER — Encounter: Payer: Self-pay | Admitting: Internal Medicine

## 2015-03-28 DIAGNOSIS — R1310 Dysphagia, unspecified: Secondary | ICD-10-CM

## 2015-03-28 DIAGNOSIS — K222 Esophageal obstruction: Secondary | ICD-10-CM | POA: Insufficient documentation

## 2015-03-28 DIAGNOSIS — K224 Dyskinesia of esophagus: Secondary | ICD-10-CM | POA: Diagnosis not present

## 2015-03-30 ENCOUNTER — Other Ambulatory Visit: Payer: Self-pay

## 2015-03-30 DIAGNOSIS — K222 Esophageal obstruction: Secondary | ICD-10-CM

## 2015-04-22 DIAGNOSIS — M5136 Other intervertebral disc degeneration, lumbar region: Secondary | ICD-10-CM | POA: Diagnosis not present

## 2015-04-22 DIAGNOSIS — M4726 Other spondylosis with radiculopathy, lumbar region: Secondary | ICD-10-CM | POA: Diagnosis not present

## 2015-04-22 DIAGNOSIS — Z981 Arthrodesis status: Secondary | ICD-10-CM | POA: Diagnosis not present

## 2015-04-22 DIAGNOSIS — Z6827 Body mass index (BMI) 27.0-27.9, adult: Secondary | ICD-10-CM | POA: Diagnosis not present

## 2015-04-22 DIAGNOSIS — M5416 Radiculopathy, lumbar region: Secondary | ICD-10-CM | POA: Diagnosis not present

## 2015-04-22 DIAGNOSIS — M546 Pain in thoracic spine: Secondary | ICD-10-CM | POA: Diagnosis not present

## 2015-04-26 ENCOUNTER — Ambulatory Visit (HOSPITAL_COMMUNITY)
Admission: RE | Admit: 2015-04-26 | Discharge: 2015-04-26 | Disposition: A | Discharge status: Home / Self Care | Payer: Medicare Other | Source: Ambulatory Visit | Attending: Gastroenterology | Admitting: Gastroenterology

## 2015-04-26 ENCOUNTER — Ambulatory Visit (HOSPITAL_COMMUNITY): Payer: Medicare Other | Admitting: Anesthesiology

## 2015-04-26 ENCOUNTER — Encounter (HOSPITAL_COMMUNITY): Payer: Self-pay

## 2015-04-26 ENCOUNTER — Encounter (HOSPITAL_COMMUNITY)
Admission: RE | Disposition: A | Discharge status: Home / Self Care | Payer: Self-pay | Source: Ambulatory Visit | Attending: Gastroenterology

## 2015-04-26 DIAGNOSIS — N138 Other obstructive and reflux uropathy: Secondary | ICD-10-CM | POA: Insufficient documentation

## 2015-04-26 DIAGNOSIS — I10 Essential (primary) hypertension: Secondary | ICD-10-CM | POA: Diagnosis not present

## 2015-04-26 DIAGNOSIS — M109 Gout, unspecified: Secondary | ICD-10-CM | POA: Diagnosis not present

## 2015-04-26 DIAGNOSIS — K222 Esophageal obstruction: Secondary | ICD-10-CM | POA: Diagnosis not present

## 2015-04-26 DIAGNOSIS — Z79899 Other long term (current) drug therapy: Secondary | ICD-10-CM | POA: Diagnosis not present

## 2015-04-26 DIAGNOSIS — I2510 Atherosclerotic heart disease of native coronary artery without angina pectoris: Secondary | ICD-10-CM | POA: Insufficient documentation

## 2015-04-26 DIAGNOSIS — Z87891 Personal history of nicotine dependence: Secondary | ICD-10-CM | POA: Insufficient documentation

## 2015-04-26 DIAGNOSIS — N401 Enlarged prostate with lower urinary tract symptoms: Secondary | ICD-10-CM | POA: Insufficient documentation

## 2015-04-26 DIAGNOSIS — E785 Hyperlipidemia, unspecified: Secondary | ICD-10-CM | POA: Insufficient documentation

## 2015-04-26 DIAGNOSIS — Z86718 Personal history of other venous thrombosis and embolism: Secondary | ICD-10-CM | POA: Diagnosis not present

## 2015-04-26 DIAGNOSIS — M1990 Unspecified osteoarthritis, unspecified site: Secondary | ICD-10-CM | POA: Insufficient documentation

## 2015-04-26 DIAGNOSIS — R1310 Dysphagia, unspecified: Secondary | ICD-10-CM | POA: Diagnosis present

## 2015-04-26 HISTORY — PX: ESOPHAGOGASTRODUODENOSCOPY (EGD) WITH PROPOFOL: SHX5813

## 2015-04-26 SURGERY — ESOPHAGOGASTRODUODENOSCOPY (EGD) WITH PROPOFOL
Anesthesia: Monitor Anesthesia Care

## 2015-04-26 MED ORDER — PROPOFOL 10 MG/ML IV BOLUS
INTRAVENOUS | Status: DC | PRN
  Administered 2015-04-26: 20 mg via INTRAVENOUS
  Administered 2015-04-26: 50 mg via INTRAVENOUS

## 2015-04-26 MED ORDER — SODIUM CHLORIDE 0.9 % IV SOLN: INTRAVENOUS | Status: DC

## 2015-04-26 MED ORDER — LACTATED RINGERS IV SOLN
INTRAVENOUS | Status: DC
  Administered 2015-04-26: 11:00:00 via INTRAVENOUS

## 2015-04-26 MED ORDER — LIDOCAINE HCL (CARDIAC) 20 MG/ML IV SOLN
INTRAVENOUS | Status: AC
  Filled 2015-04-26: qty 5

## 2015-04-26 MED ORDER — PROPOFOL 10 MG/ML IV BOLUS
INTRAVENOUS | Status: AC
  Filled 2015-04-26: qty 20

## 2015-04-26 MED ORDER — LIDOCAINE HCL (CARDIAC) 20 MG/ML IV SOLN
INTRAVENOUS | Status: DC | PRN
  Administered 2015-04-26: 50 mg via INTRAVENOUS

## 2015-04-26 SURGICAL SUPPLY — 14 items

## 2015-04-26 NOTE — Anesthesia Postprocedure Evaluation (Signed)
  Anesthesia Post-op Note  Patient: [name]  Procedure(s) Performed: Procedure(s) (LRB): ESOPHAGOGASTRODUODENOSCOPY (EGD) WITH PROPOFOL (N/A)  Patient Location: PACU  Anesthesia Type: MAC  Level of Consciousness: awake and alert   Airway and Oxygen Therapy: Patient Spontanous Breathing  Post-op Pain: mild  Post-op Assessment: Post-op Vital signs reviewed, Patient's Cardiovascular Status Stable, Respiratory Function Stable, Patent Airway and No signs of Nausea or vomiting  Last Vitals:  Filed Vitals:   04/26/15 1201  BP: 153/64  Pulse: 69  Temp:   Resp: 15    Post-op Vital Signs: stable   Complications: No apparent anesthesia complications

## 2015-04-26 NOTE — H&P (Signed)
_                                                                                                                History of Present Illness:  Mr. Mckibben is an 81 year old white male with history of esophageal stricture here for repeat dilatation.  He underwent esophageal dilation in May, 2016 where a distal esophageal stricture was dilated.  He had only transient improvement.  He still complaining of dysphagia.  A barium esophagram demonstrated a persistent smooth stricture of the distal esophagus with hang up of a barium pill.  Patient is here for repeat dilatation therapy.   Past Medical History  Diagnosis Date  . ACTINIC KERATOSIS, HEAD 03/23/2008  . ALLERGIC RHINITIS 03/27/2007  . BENIGN PROSTATIC HYPERTROPHY, WITH URINARY OBSTRUCTION 10/16/2007  . CARPAL TUNNEL SYNDROME, LEFT 04/13/2008  . ERECTILE DYSFUNCTION, ORGANIC 02/20/2010  . Gout, unspecified 02/20/2010  . HYPERLIPIDEMIA, WITH HIGH HDL 10/18/2008  . KNEE PAIN 10/10/2009  . LOW BACK PAIN 03/27/2007  . NEOPLASM, MALIGNANT, SKIN, TRUNK 05/09/2007  . Patellar tendinitis 10/10/2009  . SKIN CANCER, HX OF 03/27/2007  . UNS ADVRS EFF UNS RX MEDICINAL&BIOLOGICAL SBSTNC 08/18/2007  . HYPERTENSION 05/09/2007    sees Dr. Benay Pillow  . Headache(784.0)     migraines hx of  . Chronic neck pain   . Chronic back pain   . CAD 08/18/2007    "patient denies any issues with heart, does not see cardiologist"  . Urinary urgency   . History of melanoma   . Arthritis   . DVT (deep venous thrombosis)    Past Surgical History  Procedure Laterality Date  . Knee arthroscopy    . Cholecystectomy    . Nasal sinus surgery    . Vasectomy    . Lumbar fusions    . Joint replacement surgery    . Rectal fissure    . Lateral fusion lumbar spine, transverse    . Laminectomy    . Lumbar fusion    . Rectal fissure    . Vitrectomy      right and left 2013  . Carpal tunnel release    . Eye surgery      bilateral cataracts removed and  vitrectomies  . Blepharoplasty    . Back surgery    . Posterior cervical fusion/foraminotomy Right 10/01/2012    Procedure: POSTERIOR CERVICAL FUSION/FORAMINOTOMY LEVEL 1;  Surgeon: Hosie Spangle, MD;  Location: [place] [facility];  Service: Neurosurgery;  Laterality: Right;  Right Cervical seven thoracic one Cervical laminectomy/foraminotomy with posterior cervical arthrodesis   . Joint replacement Right     knee  . Appendectomy  1969  . Lumbar laminectomy/decompression microdiscectomy Left 11/03/2012    Procedure: LUMBAR LAMINECTOMY/DECOMPRESSION MICRODISCECTOMY 1 LEVEL;  Surgeon: Hosie Spangle, MD;  Location: [place] [facility];  Service: Neurosurgery;  Laterality: Left;  LEFT L5S1 laminotomy foraminotomy and possible microdiskectomy  . Melanoma excision    . Posterior lumbar  fusion N/A 05/23/2013    Procedure: exploration of lumbar wound, explantation of left interbody implant.;  Surgeon: Hosie Spangle, MD;  Location: [facility];  Service: Neurosurgery;  Laterality: N/A;   family history includes Anemia in his father; Dementia in his mother; Heart disease in his father. Current Facility-Administered Medications  Medication Dose Route Frequency Provider Last Rate Last Dose  . 0.9 %  sodium chloride infusion   Intravenous Continuous Inda Castle, MD      . lactated ringers infusion   Intravenous Continuous Inda Castle, MD 125 mL/hr at 04/26/15 1105     Allergies as of 03/30/2015 - Review Complete 03/22/2015  Allergen Reaction Noted  . Gabapentin  03/16/2013    reports that he quit smoking about 33 years ago. He does not have any smokeless tobacco history on file. He reports that he drinks about 1.8 oz of alcohol per week. He reports that he does not use illicit drugs.   Review of Systems: Pertinent positive and negative review of systems were noted in the above HPI section. All other review of systems were otherwise negative.  Vital signs were reviewed in today's medical  record Physical Exam: General: Well developed , well nourished, no acute distress Skin: anicteric Head: Normocephalic and atraumatic Eyes:  sclerae anicteric, EOMI Ears: Normal auditory acuity Mouth: No deformity or lesions Neck: Supple, no masses or thyromegaly Lymph Nodes: no lymphadenopathy Lungs: Clear throughout to auscultation Heart: Regular rate and rhythm; no murmurs, rubs or bruits Gastroinestinal: Soft, non tender and non distended. No masses, hepatosplenomegaly or hernias noted. Normal Bowel sounds Rectal:deferred Musculoskeletal: Symmetrical with no gross deformities  Skin: No lesions on visible extremities Pulses:  Normal pulses noted Extremities: No clubbing, cyanosis, edema or deformities noted Neurological: Alert oriented x 4, grossly nonfocal Cervical Nodes:  No significant cervical adenopathy Inguinal Nodes: No significant inguinal adenopathy Psychological:  Alert and cooperative. Normal mood and affect  Impression - persistent distal esophageal stricture causing dysphagia  Plan EGD with balloon dilation

## 2015-04-26 NOTE — Transfer of Care (Signed)
Immediate Anesthesia Transfer of Care Note  Patient: [name]  Procedure(s) Performed: Procedure(s): ESOPHAGOGASTRODUODENOSCOPY (EGD) WITH PROPOFOL (N/A)  Patient Location: PACU  Anesthesia Type:MAC  Level of Consciousness: awake, alert  and oriented  Airway & Oxygen Therapy: Patient Spontanous Breathing and Patient connected to nasal cannula oxygen  Post-op Assessment: Report given to RN and Post -op Vital signs reviewed and stable  Post vital signs: Reviewed and stable  Last Vitals:  Filed Vitals:   04/26/15 1051  BP: 173/91  Pulse: 77  Temp: 36.9 C  Resp: 13    Complications: No apparent anesthesia complications

## 2015-04-26 NOTE — Anesthesia Preprocedure Evaluation (Addendum)
Anesthesia Evaluation  Patient identified by MRN, date of birth, ID band Patient awake    Reviewed: Allergy & Precautions, H&P , NPO status , Patient's Chart, lab work & pertinent test results, reviewed documented beta blocker date and time   Airway Mallampati: II  TM Distance: >3 FB Neck ROM: full    Dental  (+) Dental Advisory Given, Caps All front teeth are capped:   Pulmonary neg pulmonary ROS, former smoker,    Pulmonary exam normal breath sounds clear to auscultation       Cardiovascular Exercise Tolerance: Good hypertension, Pt. on home beta blockers and Pt. on medications + CAD  Normal cardiovascular exam Rhythm:regular Rate:Normal     Neuro/Psych negative neurological ROS  negative psych ROS   GI/Hepatic negative GI ROS, Neg liver ROS,   Endo/Other  negative endocrine ROS  Renal/GU negative Renal ROS  negative genitourinary   Musculoskeletal   Abdominal   Peds  Hematology negative hematology ROS (+)   Anesthesia Other Findings   Reproductive/Obstetrics negative OB ROS                            Anesthesia Physical Anesthesia Plan  ASA: III  Anesthesia Plan: MAC   Post-op Pain Management:    Induction:   Airway Management Planned:   Additional Equipment:   Intra-op Plan:   Post-operative Plan:   Informed Consent: I have reviewed the patients History and Physical, chart, labs and discussed the procedure including the risks, benefits and alternatives for the proposed anesthesia with the patient or authorized representative who has indicated his/her understanding and acceptance.   Dental Advisory Given  Plan Discussed with: CRNA and Surgeon  Anesthesia Plan Comments:         Anesthesia Quick Evaluation

## 2015-04-27 ENCOUNTER — Encounter (HOSPITAL_COMMUNITY): Payer: Self-pay | Admitting: Gastroenterology

## 2015-04-30 ENCOUNTER — Other Ambulatory Visit (INDEPENDENT_AMBULATORY_CARE_PROVIDER_SITE_OTHER): Payer: Medicare Other

## 2015-04-30 ENCOUNTER — Encounter: Payer: Self-pay | Admitting: Family Medicine

## 2015-04-30 ENCOUNTER — Ambulatory Visit (INDEPENDENT_AMBULATORY_CARE_PROVIDER_SITE_OTHER): Payer: Medicare Other | Admitting: Family Medicine

## 2015-04-30 VITALS — BP 138/70 | HR 78 | Ht 72.0 in | Wt 205.0 lb

## 2015-04-30 DIAGNOSIS — M25562 Pain in left knee: Secondary | ICD-10-CM | POA: Diagnosis not present

## 2015-04-30 DIAGNOSIS — M1712 Unilateral primary osteoarthritis, left knee: Secondary | ICD-10-CM | POA: Insufficient documentation

## 2015-04-30 DIAGNOSIS — IMO0002 Reserved for concepts with insufficient information to code with codable children: Secondary | ICD-10-CM

## 2015-04-30 DIAGNOSIS — M129 Arthropathy, unspecified: Secondary | ICD-10-CM | POA: Diagnosis not present

## 2015-04-30 DIAGNOSIS — M7122 Synovial cyst of popliteal space [Baker], left knee: Secondary | ICD-10-CM | POA: Diagnosis not present

## 2015-04-30 DIAGNOSIS — M7120 Synovial cyst of popliteal space [Baker], unspecified knee: Secondary | ICD-10-CM | POA: Insufficient documentation

## 2015-04-30 NOTE — Progress Notes (Signed)
Pre-visit discussion using our clinic review tool. No additional management support is needed unless otherwise documented below in the visit note.  

## 2015-04-30 NOTE — Progress Notes (Signed)
[name] [address] [place], [place] [address] Phone: [phone] Subjective:    I'm seeing this patient by the request  of:  Olga Millers, MD   CC: Left knee pain  GYB:[id number] [name] is a 81 y.o. male coming in with complaint of left knee pain.  Patient is had this pain for multiple years but has worsened over the course last several weeks. 2 weeks ago patient noticed significant pain may be even swelling in the knee. Seems to hurt more the posterior aspect of the knee. Some mild instability. Rates the severity of pain a 7 out of 10. Patient is trying to care for his wife who is not doing very well. Patient states that he is sleeping comfortable he. Patient does have a history of a near placement on the contralateral side. Patient would like to avoid any type of surgery secondary to taking care of his wife. Not stopping him from daily activities at this time.     Past Medical History  Diagnosis Date  . ACTINIC KERATOSIS, HEAD 03/23/2008  . ALLERGIC RHINITIS 03/27/2007  . BENIGN PROSTATIC HYPERTROPHY, WITH URINARY OBSTRUCTION 10/16/2007  . CARPAL TUNNEL SYNDROME, LEFT 04/13/2008  . ERECTILE DYSFUNCTION, ORGANIC 02/20/2010  . Gout, unspecified 02/20/2010  . HYPERLIPIDEMIA, WITH HIGH HDL 10/18/2008  . KNEE PAIN 10/10/2009  . LOW BACK PAIN 03/27/2007  . NEOPLASM, MALIGNANT, SKIN, TRUNK 05/09/2007  . Patellar tendinitis 10/10/2009  . SKIN CANCER, HX OF 03/27/2007  . UNS ADVRS EFF UNS RX MEDICINAL&BIOLOGICAL SBSTNC 08/18/2007  . HYPERTENSION 05/09/2007    sees Dr. Benay Pillow  . Headache(784.0)     migraines hx of  . Chronic neck pain   . Chronic back pain   . CAD 08/18/2007    "patient denies any issues with heart, does not see cardiologist"  . Urinary urgency   . History of melanoma   . Arthritis   . DVT (deep venous thrombosis)    Past Surgical History  Procedure Laterality Date  . Knee arthroscopy    . Cholecystectomy    . Nasal sinus  surgery    . Vasectomy    . Lumbar fusions    . Joint replacement surgery    . Rectal fissure    . Lateral fusion lumbar spine, transverse    . Laminectomy    . Lumbar fusion    . Rectal fissure    . Vitrectomy      right and left 2013  . Carpal tunnel release    . Eye surgery      bilateral cataracts removed and vitrectomies  . Blepharoplasty    . Back surgery    . Posterior cervical fusion/foraminotomy Right 10/01/2012    Procedure: POSTERIOR CERVICAL FUSION/FORAMINOTOMY LEVEL 1;  Surgeon: Hosie Spangle, MD;  Location: [place] [facility];  Service: Neurosurgery;  Laterality: Right;  Right Cervical seven thoracic one Cervical laminectomy/foraminotomy with posterior cervical arthrodesis   . Joint replacement Right     knee  . Appendectomy  1969  . Lumbar laminectomy/decompression microdiscectomy Left 11/03/2012    Procedure: LUMBAR LAMINECTOMY/DECOMPRESSION MICRODISCECTOMY 1 LEVEL;  Surgeon: Hosie Spangle, MD;  Location: [place] [facility];  Service: Neurosurgery;  Laterality: Left;  LEFT L5S1 laminotomy foraminotomy and possible microdiskectomy  . Melanoma excision    . Posterior lumbar fusion N/A 05/23/2013    Procedure: exploration of lumbar wound, explantation of left interbody implant.;  Surgeon: Hosie Spangle, MD;  Location: [facility];  Service: Neurosurgery;  Laterality: N/A;  . Esophagogastroduodenoscopy (egd) with propofol N/A 04/26/2015    Procedure: ESOPHAGOGASTRODUODENOSCOPY (EGD) WITH PROPOFOL;  Surgeon: Inda Castle, MD;  Location: [facility];  Service: Endoscopy;  Laterality: N/A;   Social History  Substance Use Topics  . Smoking status: Former Smoker -- 1.00 packs/day for 33 years    Quit date: 09/29/1981  . Smokeless tobacco: Not on file  . Alcohol Use: 1.8 oz/week    3 Standard drinks or equivalent per week     Comment: daily   Allergies  Allergen Reactions  . Gabapentin     Lower extremity edema   Family History  Problem Relation Age of Onset  . Dementia  Mother   . Anemia Father   . Heart disease Father      Past medical history, social, surgical and family history all reviewed in electronic medical record.   Review of Systems: No headache, visual changes, nausea, vomiting, diarrhea, constipation, dizziness, abdominal pain, skin rash, fevers, chills, night sweats, weight loss, swollen lymph nodes, body aches, joint swelling, muscle aches, chest pain, shortness of breath, mood changes.   Objective There were no vitals taken for this visit.  General: No apparent distress alert and oriented x3 mood and affect normal, dressed appropriately.  HEENT: Pupils equal, extraocular movements intact  Respiratory: Patient's speak in full sentences and does not appear short of breath  Cardiovascular: No lower extremity edema, non tender, no erythema  Skin: Warm dry intact with no signs of infection or rash on extremities or on axial skeleton.  Abdomen: Soft nontender  Neuro: Cranial nerves II through XII are intact, neurovascularly intact in all extremities with 2+ DTRs and 2+ pulses.  Lymph: No lymphadenopathy of posterior or anterior cervical chain or axillae bilaterally.  Gait normal with good balance and coordination.  MSK:  Non tender with full range of motion and good stability and symmetric strength and tone of shoulders, elbows, wrist, hip, and ankles bilaterally.  Knee: Left Valgus deformity noted Tender over the popliteal artery and patient does have a Baker cyst palpated. ROM full in flexion and extension and lower leg rotation. Ligaments with solid consistent endpoints including ACL, PCL, LCL, MCL. Negative Mcmurray's, Apley's, and Thessalonian tests. Non painful patellar compression. Patellar glide mild crepitus. Patellar and quadriceps tendons unremarkable. Hamstring and quadriceps strength is normal.  Contralateral knee has incision from knee replacement  MSK US performed of: Left This study was ordered, performed, and interpreted  by Charlann Boxer D.O.  Knee: All structures visualized. Severe bone-on-bone osteophytic changes of the medial joint line and Baker cyst noted Patellar Tendon unremarkable on long and transverse views without effusion. No abnormality of prepatellar bursa. LCL and MCL unremarkable on long and transverse views. No abnormality of origin of medial or lateral head of the gastrocnemius.  IMPRESSION:  Severe arthritis and Baker cyst   Procedure: Real-time Ultrasound Guided Injection of left knee Device: GE Logiq E  Ultrasound guided injection is preferred based studies that show increased duration, increased effect, greater accuracy, decreased procedural pain, increased response rate, and decreased cost with ultrasound guided versus blind injection.  Verbal informed consent obtained.  Time-out conducted.  Noted no overlying erythema, induration, or other signs of local infection.  Skin prepped in a sterile fashion.  Local anesthesia: Topical Ethyl chloride.  With sterile technique and under real time ultrasound guidance: With a 22-gauge 2 inch needle patient was injected with 4 cc of 0.5% Marcaine and removed 20cc of strawlike colored  fluid. Then injected 1 cc of Kenalog 40 mg/dL. This was from a posterior approach Completed without difficulty  Pain immediately resolved suggesting accurate placement of the medication.  Advised to call if fevers/chills, erythema, induration, drainage, or persistent bleeding.  Images permanently stored and available for review in the ultrasound unit.  Impression: Technically successful ultrasound guided injection.    Impression and Recommendations:     This case required medical decision making of moderate complexity.

## 2015-04-30 NOTE — Patient Instructions (Addendum)
Good to see you.  Ice 20 minutes 2 times daily. Usually after activity and before bed. pennsaid pinkie amount topically 2 times daily as needed.  We will try the brace and they will call you soon.  Take tylenol 325 mg three times a day is the best evidence based medicine we have for arthritis.  Glucosamine sulfate 1500mg  twice daily Vitamin D 2000 IU daily Fish oil 2 grams daily.  Tumeric 500mg  twice daily.  Capsaicin topically up to four times a day may also help with pain. Cortisone injections are an option if these interventions do not seem to make a difference or need more relief.  If cortisone injections do not help, there are different types of shots that may help but they take longer to take effect.  We can discuss this at follow up.  It's important that you continue to stay active. Water aerobics and cycling with low resistance are the best two types of exercise for arthritis. Come back and see me in 3-4 weeks.   Marland Kitchen

## 2015-04-30 NOTE — Assessment & Plan Note (Signed)
Patient given injection today. Discussed icing regimen and home exercises. Discussed possible bracing, discussed which activities to potentially avoid. Patient given a trial topical anti-inflammatory send a list of over-the-counter medications a could be beneficial. Patient responded very well to the injection. We discussed that this may need to be needed every 3-4 months if needed. Patient come back and see me again in 3-4 weeks for further evaluation and treatment.

## 2015-05-03 DIAGNOSIS — L821 Other seborrheic keratosis: Secondary | ICD-10-CM | POA: Diagnosis not present

## 2015-05-03 DIAGNOSIS — L570 Actinic keratosis: Secondary | ICD-10-CM | POA: Diagnosis not present

## 2015-05-03 DIAGNOSIS — D1801 Hemangioma of skin and subcutaneous tissue: Secondary | ICD-10-CM | POA: Diagnosis not present

## 2015-05-03 DIAGNOSIS — D225 Melanocytic nevi of trunk: Secondary | ICD-10-CM | POA: Diagnosis not present

## 2015-05-03 DIAGNOSIS — L814 Other melanin hyperpigmentation: Secondary | ICD-10-CM | POA: Diagnosis not present

## 2015-05-03 DIAGNOSIS — L820 Inflamed seborrheic keratosis: Secondary | ICD-10-CM | POA: Diagnosis not present

## 2015-05-06 NOTE — Op Note (Signed)
[facility] [place], [address]   ENDOSCOPY PROCEDURE REPORT  PATIENT: Jordan, [name]  MR#: [id number] BIRTHDATE: [birth year] , 83  yrs. old GENDER: male ENDOSCOPIST: Inda Castle, MD REFERRED BY:  Vertell Novak, M.D. PROCEDURE DATE:  04/26/2015 PROCEDURE:  EGD w/ balloon dilation ASA CLASS:     Class II INDICATIONS:  dysphagia.  esophagram demonstrates a distal esophageal stricture MEDICATIONS: Monitored anesthesia care TOPICAL ANESTHETIC:  DESCRIPTION OF PROCEDURE: After the risks benefits and alternatives of the procedure were thoroughly explained, informed consent was obtained.  The Grant [id number] endoscope was introduced through the mouth and advanced to the second portion of the duodenum , Without limitations.  The instrument was slowly withdrawn as the mucosa was fully examined.    ESOPHAGUS: There was a benign appearing stricture in the distal esophagus.  The stricture was traversable.  Using a TTS-balloon the stricture was dilated up to 23mm.  (16.12-28-17-37mm for 30 seconds each.  There was mild resistance to the last 3 dilators.  There was no heme. Except for the findings listed, the EGD was otherwise normal.         The scope was then withdrawn from the patient and the procedure completed.  COMPLICATIONS: There were no immediate complications.  ENDOSCOPIC IMPRESSION: 1.   There was a stricture in the distal esophagus; Using a TTS-balloon the stricture was dilated up to 70mm 2.   EGD was otherwise normal  RECOMMENDATIONS: repeat dilation as necessary  REPEAT EXAM:  eSigned:  Inda Castle, MD 04/26/2015 11:46 AM    CC:

## 2015-05-10 ENCOUNTER — Telehealth: Payer: Self-pay | Admitting: Internal Medicine

## 2015-05-10 NOTE — Telephone Encounter (Signed)
Rec'd from [place] forward 3 pages to Dr. Arnoldo Morale

## 2015-05-12 ENCOUNTER — Encounter: Payer: Self-pay | Admitting: Family Medicine

## 2015-05-12 ENCOUNTER — Encounter: Payer: Self-pay | Admitting: Gastroenterology

## 2015-06-03 ENCOUNTER — Encounter: Payer: Self-pay | Admitting: Internal Medicine

## 2015-06-13 DIAGNOSIS — L578 Other skin changes due to chronic exposure to nonionizing radiation: Secondary | ICD-10-CM | POA: Diagnosis not present

## 2015-06-17 ENCOUNTER — Telehealth: Payer: Self-pay | Admitting: Internal Medicine

## 2015-06-17 NOTE — Telephone Encounter (Signed)
Patient states his knee is really bad today and was wanting a Cortizone injection today.  He does see Dr. Tamala Julian but Dr. Tamala Julian is not in today.  He wanted to know if Dr. Sharlet Salina could do this or what she suggest for today/

## 2015-06-17 NOTE — Telephone Encounter (Signed)
He had an injection into the knee on 04/30/15 and cannot be repeated for 3 months. Suggest icing his knee and using tylenol or ibuprofen for extra pain.

## 2015-06-17 NOTE — Telephone Encounter (Signed)
Patient states that Dr. Sharlet Salina has to be crazy.  He says that icing does not work and Thanks he is going to limp!

## 2015-06-17 NOTE — Telephone Encounter (Signed)
He is free to call an orthopedic to see if they can see him today for other options.

## 2015-06-20 ENCOUNTER — Other Ambulatory Visit: Payer: Self-pay | Admitting: Internal Medicine

## 2015-06-22 ENCOUNTER — Other Ambulatory Visit: Payer: Self-pay | Admitting: Geriatric Medicine

## 2015-06-22 MED ORDER — PREDNISONE 10 MG PO TABS: ORAL_TABLET | ORAL | Status: DC

## 2015-06-26 ENCOUNTER — Other Ambulatory Visit: Payer: Self-pay | Admitting: Internal Medicine

## 2015-07-06 ENCOUNTER — Telehealth: Payer: Self-pay | Admitting: Endocrinology

## 2015-07-06 NOTE — Telephone Encounter (Signed)
Patient called stating that he would like to speak with Suanne Marker [name] is 200 miles from home with no insulin    Please advise   Thank you

## 2015-07-06 NOTE — Telephone Encounter (Signed)
Patient actually called about his wife Leith Search, they left home without enough insulin and needed some sent down to [facility] at [facility]. rx was sent

## 2015-07-09 ENCOUNTER — Other Ambulatory Visit: Payer: Self-pay | Admitting: Internal Medicine

## 2015-07-14 ENCOUNTER — Encounter: Payer: Self-pay | Admitting: Family Medicine

## 2015-07-14 NOTE — Telephone Encounter (Signed)
Called pt & scheduled appt for 12.5.16 @115 .

## 2015-07-18 ENCOUNTER — Encounter: Payer: Self-pay | Admitting: Family Medicine

## 2015-07-18 ENCOUNTER — Ambulatory Visit (INDEPENDENT_AMBULATORY_CARE_PROVIDER_SITE_OTHER): Payer: Medicare Other | Admitting: Family Medicine

## 2015-07-18 VITALS — BP 138/72 | HR 96 | Ht 72.0 in | Wt 203.0 lb

## 2015-07-18 DIAGNOSIS — G8929 Other chronic pain: Secondary | ICD-10-CM

## 2015-07-18 DIAGNOSIS — IMO0002 Reserved for concepts with insufficient information to code with codable children: Secondary | ICD-10-CM

## 2015-07-18 DIAGNOSIS — M5416 Radiculopathy, lumbar region: Secondary | ICD-10-CM

## 2015-07-18 DIAGNOSIS — M5410 Radiculopathy, site unspecified: Secondary | ICD-10-CM | POA: Diagnosis not present

## 2015-07-18 DIAGNOSIS — M129 Arthropathy, unspecified: Secondary | ICD-10-CM | POA: Diagnosis not present

## 2015-07-18 NOTE — Patient Instructions (Signed)
Good to see you We injected your knee again today.  Look at handout if it does not have the knee last a long time For the back tried some trigger point injections.  Ice is your friend when you need it Low dose zanaflex up to 3 times a day but may make you sleepy.  Great to use before bed.  Duexis 3 times a day for 3 days.  Watch for stomach pain.  Consider vitamin D 4000 IU daiily and turmeric 500mg  twice daily (if it does not upset stomach) See me again in 2 weeks or so to make sure we are making progress.  Happy holidays!

## 2015-07-18 NOTE — Progress Notes (Signed)
[name] [address] [place], [place] [address] Phone: [phone] Subjective:    I'm seeing this patient by the request  of:  Hoyt Koch, MD   CC: Left sided back pain  [id number] [name] is a 81 y.o. male coming in with complaint of left left-sided back pain. Past medical history significant for lumbar fusion. Patient is had this pain intermittently for quite some time and seems to be worsening. Seems to be fairly localized just above the hip on the left side for the last 4 weeks. Patient describes the pain as more until H sensation and can catch his breath when it does occur. Some mild radiation down the leg. Has had surgery and feels somewhat similar but not as constant and is was previously. Patient is following up with his neurosurgeon tomorrow. Having significant amount of pain. States that he's been very uncomfortable for weeks but seems to be somewhat worsening. Denies any fevers chills or any abnormal weight loss.  Patient is also having left knee pain. Was given an injection 3 months ago. Patient had a Baker cyst that was aspirated at the time as well. States that the pain is starting to slowly come back. Starting given some instability. May be contribute to some of his back pain.     Past Medical History  Diagnosis Date  . ACTINIC KERATOSIS, HEAD 03/23/2008  . ALLERGIC RHINITIS 03/27/2007  . BENIGN PROSTATIC HYPERTROPHY, WITH URINARY OBSTRUCTION 10/16/2007  . CARPAL TUNNEL SYNDROME, LEFT 04/13/2008  . ERECTILE DYSFUNCTION, ORGANIC 02/20/2010  . Gout, unspecified 02/20/2010  . HYPERLIPIDEMIA, WITH HIGH HDL 10/18/2008  . KNEE PAIN 10/10/2009  . LOW BACK PAIN 03/27/2007  . NEOPLASM, MALIGNANT, SKIN, TRUNK 05/09/2007  . Patellar tendinitis 10/10/2009  . SKIN CANCER, HX OF 03/27/2007  . UNS ADVRS EFF UNS RX MEDICINAL&BIOLOGICAL SBSTNC 08/18/2007  . HYPERTENSION 05/09/2007    sees Dr. Benay Pillow  . Headache(784.0)     migraines hx of    . Chronic neck pain   . Chronic back pain   . CAD 08/18/2007    "patient denies any issues with heart, does not see cardiologist"  . Urinary urgency   . History of melanoma   . Arthritis   . DVT (deep venous thrombosis) [facility])    Past Surgical History  Procedure Laterality Date  . Knee arthroscopy    . Cholecystectomy    . Nasal sinus surgery    . Vasectomy    . Lumbar fusions    . Joint replacement surgery    . Rectal fissure    . Lateral fusion lumbar spine, transverse    . Laminectomy    . Lumbar fusion    . Rectal fissure    . Vitrectomy      right and left 2013  . Carpal tunnel release    . Eye surgery      bilateral cataracts removed and vitrectomies  . Blepharoplasty    . Back surgery    . Posterior cervical fusion/foraminotomy Right 10/01/2012    Procedure: POSTERIOR CERVICAL FUSION/FORAMINOTOMY LEVEL 1;  Surgeon: Hosie Spangle, MD;  Location: [place] [facility];  Service: Neurosurgery;  Laterality: Right;  Right Cervical seven thoracic one Cervical laminectomy/foraminotomy with posterior cervical arthrodesis   . Joint replacement Right     knee  . Appendectomy  1969  . Lumbar laminectomy/decompression microdiscectomy Left 11/03/2012    Procedure: LUMBAR LAMINECTOMY/DECOMPRESSION MICRODISCECTOMY 1 LEVEL;  Surgeon: Hosie Spangle, MD;  Location: [place] [facility];  Service: Neurosurgery;  Laterality: Left;  LEFT L5S1 laminotomy foraminotomy and possible microdiskectomy  . Melanoma excision    . Posterior lumbar fusion N/A 05/23/2013    Procedure: exploration of lumbar wound, explantation of left interbody implant.;  Surgeon: Hosie Spangle, MD;  Location: [place];  Service: Neurosurgery;  Laterality: N/A;  . Esophagogastroduodenoscopy (egd) with propofol N/A 04/26/2015    Procedure: ESOPHAGOGASTRODUODENOSCOPY (EGD) WITH PROPOFOL;  Surgeon: Inda Castle, MD;  Location: [facility];  Service: Endoscopy;  Laterality: N/A;   Social History  Substance Use Topics  . Smoking  status: Former Smoker -- 1.00 packs/day for 33 years    Quit date: 09/29/1981  . Smokeless tobacco: None  . Alcohol Use: 1.8 oz/week    3 Standard drinks or equivalent per week     Comment: daily   Allergies  Allergen Reactions  . Gabapentin     Lower extremity edema   Family History  Problem Relation Age of Onset  . Dementia Mother   . Anemia Father   . Heart disease Father      Past medical history, social, surgical and family history all reviewed in electronic medical record.   Review of Systems: No headache, visual changes, nausea, vomiting, diarrhea, constipation, dizziness, abdominal pain, skin rash, fevers, chills, night sweats, weight loss, swollen lymph nodes, body aches, joint swelling, muscle aches, chest pain, shortness of breath, mood changes.   Objective Blood pressure 138/72, pulse 96, height 6' (1.829 m), weight 203 lb (92.08 kg), SpO2 94 %.  General: No apparent distress alert and oriented x3 mood and affect normal, dressed appropriately.  HEENT: Pupils equal, extraocular movements intact  Respiratory: Patient's speak in full sentences and does not appear short of breath  Cardiovascular: No lower extremity edema, non tender, no erythema  Skin: Warm dry intact with no signs of infection or rash on extremities or on axial skeleton.  Abdomen: Soft nontender  Neuro: Cranial nerves II through XII are intact, neurovascularly intact in all extremities with 2+ DTRs and 2+ pulses.  Lymph: No lymphadenopathy of posterior or anterior cervical chain or axillae bilaterally.  Gait normal with good balance and coordination.  MSK:  Non tender with full range of motion and good stability and symmetric strength and tone of shoulders, elbows, wrist, hip, and ankles bilaterally.  Back Exam:  Inspection: Postsurgical changes noted. Patient's incision well-healed. Motion: Flexion 25 deg, Extension 15 deg, Side Bending to 35 deg bilaterally,  Rotation to 35 deg bilaterally  SLR  laying: Mild positive left XSLR laying: Negative  Palpable tenderness: Severely tender to palpation over the L4 paraspinal musculature on the left side. FABER: negative. Sensory change: Gross sensation intact to all lumbar and sacral dermatomes.  Reflexes: 2+ at both patellar tendons, 2+ at achilles tendons, Babinski's downgoing.  Strength at foot  Plantar-flexion: 5/5 Dorsi-flexion: 5/5 Eversion: 5/5 Inversion: 5/5  Leg strength  Quad: 5/5 Hamstring: 5/5 Hip flexor: 5/5 Hip abductors: 5/5  Gait unremarkable.  Knee: Left Valgus deformity noted Tender over the medial joint line. Mildly reaccumulation a Baker cyst in the popliteal fossa ROM full in flexion and extension and lower leg rotation. Ligaments with solid consistent endpoints including ACL, PCL, LCL, MCL. Negative Mcmurray's, Apley's, and Thessalonian tests. Non painful patellar compression. Patellar glide mild crepitus. Patellar and quadriceps tendons unremarkable. Hamstring and quadriceps strength is normal.  Contralateral knee has incision from knee replacement  Procedure note After verbal consent patient was prepped with alcohol swabs and  with a 25-gauge 1 inch needle patient was injected in 4 different trigger points noted in the paraspinal musculature of the lumbar spine on the left side. No blood loss. Decreased pain within minutes. Post injection instructions given.  Procedure left knee After informed written and verbal consent, patient was seated on exam table. Left knee was prepped with alcohol swab and utilizing anterolateral approach, patient's left knee space was injected with 4:1  marcaine 0.5%: Kenalog 40mg /dL. Patient tolerated the procedure well without immediate complications.    Impression and Recommendations:     This case required medical decision making of moderate complexity.

## 2015-07-18 NOTE — Progress Notes (Signed)
Pre visit review using our clinic review tool, if applicable. No additional management support is needed unless otherwise documented below in the visit note. 

## 2015-07-18 NOTE — Assessment & Plan Note (Signed)
Patient has had multiple surgeries in the lumbar spine. Patient is not having as much radicular symptoms but continues to have an intermittently. Patient's pain seemed to be more of a muscle spasm. With patient's past medical history we attempted to try a trigger point injections into the paraspinal musculature. Patient did have approximately 50% decrease in pain and muscle immediately. Encourage patient to continue to remain active. Patient is following up with his neurosurgeon tomorrow for further evaluation. He does appear that there is some radiculopathy. We may want to consider radiofrequency ablation. Patient states he would like to have further workup with his neurosurgeon.

## 2015-07-18 NOTE — Assessment & Plan Note (Signed)
Injected knee again today. Patient tolerated procedure well. Had significant decrease in pain immediately. If patient has any acute relation we will consider aspirating the knee. Patient will come back in follow-up with me again in 2-4 weeks for further evaluation. Encourage patient to continue to remain active. We may need to consider custom brace in the long run.

## 2015-07-19 DIAGNOSIS — Z6827 Body mass index (BMI) 27.0-27.9, adult: Secondary | ICD-10-CM | POA: Diagnosis not present

## 2015-07-19 DIAGNOSIS — M5136 Other intervertebral disc degeneration, lumbar region: Secondary | ICD-10-CM | POA: Diagnosis not present

## 2015-07-19 DIAGNOSIS — M4726 Other spondylosis with radiculopathy, lumbar region: Secondary | ICD-10-CM | POA: Diagnosis not present

## 2015-07-19 DIAGNOSIS — M5416 Radiculopathy, lumbar region: Secondary | ICD-10-CM | POA: Diagnosis not present

## 2015-07-19 DIAGNOSIS — Z981 Arthrodesis status: Secondary | ICD-10-CM | POA: Diagnosis not present

## 2015-07-19 DIAGNOSIS — I10 Essential (primary) hypertension: Secondary | ICD-10-CM | POA: Diagnosis not present

## 2015-07-27 ENCOUNTER — Other Ambulatory Visit: Payer: Self-pay | Admitting: Internal Medicine

## 2015-07-28 ENCOUNTER — Other Ambulatory Visit: Payer: Self-pay | Admitting: Geriatric Medicine

## 2015-07-28 ENCOUNTER — Encounter: Payer: Self-pay | Admitting: Family Medicine

## 2015-07-28 MED ORDER — PREDNISONE 10 MG PO TABS: ORAL_TABLET | ORAL | Status: DC

## 2015-07-28 NOTE — Telephone Encounter (Signed)
Sent to pharmacy 

## 2015-08-10 ENCOUNTER — Encounter: Payer: Self-pay | Admitting: Family Medicine

## 2015-08-16 DIAGNOSIS — L570 Actinic keratosis: Secondary | ICD-10-CM | POA: Diagnosis not present

## 2015-08-16 DIAGNOSIS — L821 Other seborrheic keratosis: Secondary | ICD-10-CM | POA: Diagnosis not present

## 2015-08-16 DIAGNOSIS — D1801 Hemangioma of skin and subcutaneous tissue: Secondary | ICD-10-CM | POA: Diagnosis not present

## 2015-08-16 DIAGNOSIS — I788 Other diseases of capillaries: Secondary | ICD-10-CM | POA: Diagnosis not present

## 2015-09-13 ENCOUNTER — Encounter: Payer: Self-pay | Admitting: Internal Medicine

## 2015-09-16 ENCOUNTER — Encounter: Payer: Self-pay | Admitting: Internal Medicine

## 2015-09-16 MED ORDER — FINASTERIDE 5 MG PO TABS: 5.0000 mg | ORAL_TABLET | Freq: Every day | ORAL | Status: DC

## 2015-09-19 ENCOUNTER — Other Ambulatory Visit: Payer: Self-pay | Admitting: Internal Medicine

## 2015-09-28 ENCOUNTER — Encounter: Payer: Self-pay | Admitting: Internal Medicine

## 2015-09-29 ENCOUNTER — Telehealth: Payer: Self-pay

## 2015-09-29 MED ORDER — LINACLOTIDE 290 MCG PO CAPS: 290.0000 ug | ORAL_CAPSULE | Freq: Every day | ORAL | Status: DC

## 2015-09-29 NOTE — Telephone Encounter (Signed)
PA initiated via [place]

## 2015-09-30 ENCOUNTER — Encounter: Payer: Self-pay | Admitting: Internal Medicine

## 2015-09-30 MED ORDER — BENZONATATE 100 MG PO CAPS: 100.0000 mg | ORAL_CAPSULE | Freq: Two times a day (BID) | ORAL | Status: DC | PRN

## 2015-09-30 MED ORDER — FLUTICASONE PROPIONATE 50 MCG/ACT NA SUSP: 2.0000 | Freq: Every day | NASAL | Status: DC

## 2015-09-30 NOTE — Addendum Note (Signed)
Addended by: Pricilla Holm A on: 09/30/2015 03:01 PM   Modules accepted: Orders

## 2015-10-03 ENCOUNTER — Ambulatory Visit: Payer: Medicare Other

## 2015-10-03 NOTE — Telephone Encounter (Signed)
PA Linzess denied, please advise on alternative treatment, thanks

## 2015-10-04 MED ORDER — NALOXEGOL OXALATE 25 MG PO TABS: 25.0000 mg | ORAL_TABLET | Freq: Every day | ORAL | Status: DC

## 2015-10-04 NOTE — Telephone Encounter (Signed)
Have sent in movantik instead. If not approved please have patient call insurance for alternative.

## 2015-10-10 ENCOUNTER — Encounter: Payer: Self-pay | Admitting: Internal Medicine

## 2015-10-21 ENCOUNTER — Encounter: Payer: Self-pay | Admitting: Family Medicine

## 2015-10-24 ENCOUNTER — Emergency Department (HOSPITAL_COMMUNITY): Payer: Medicare Other

## 2015-10-24 ENCOUNTER — Emergency Department (HOSPITAL_COMMUNITY)
Admission: EM | Admit: 2015-10-24 | Discharge: 2015-10-24 | Disposition: A | Discharge status: Home / Self Care | Payer: Medicare Other | Attending: Emergency Medicine | Admitting: Emergency Medicine

## 2015-10-24 ENCOUNTER — Encounter: Payer: Self-pay | Admitting: Family Medicine

## 2015-10-24 ENCOUNTER — Encounter (HOSPITAL_COMMUNITY): Payer: Self-pay | Admitting: Emergency Medicine

## 2015-10-24 ENCOUNTER — Ambulatory Visit (INDEPENDENT_AMBULATORY_CARE_PROVIDER_SITE_OTHER): Payer: Medicare Other | Admitting: Family Medicine

## 2015-10-24 VITALS — BP 180/98 | HR 90 | Ht 72.0 in | Wt 208.0 lb

## 2015-10-24 DIAGNOSIS — I2510 Atherosclerotic heart disease of native coronary artery without angina pectoris: Secondary | ICD-10-CM | POA: Diagnosis not present

## 2015-10-24 DIAGNOSIS — Z85820 Personal history of malignant melanoma of skin: Secondary | ICD-10-CM | POA: Insufficient documentation

## 2015-10-24 DIAGNOSIS — Z79899 Other long term (current) drug therapy: Secondary | ICD-10-CM | POA: Diagnosis not present

## 2015-10-24 DIAGNOSIS — Z87891 Personal history of nicotine dependence: Secondary | ICD-10-CM | POA: Diagnosis not present

## 2015-10-24 DIAGNOSIS — R1031 Right lower quadrant pain: Secondary | ICD-10-CM | POA: Diagnosis not present

## 2015-10-24 DIAGNOSIS — Z9049 Acquired absence of other specified parts of digestive tract: Secondary | ICD-10-CM | POA: Insufficient documentation

## 2015-10-24 DIAGNOSIS — G43909 Migraine, unspecified, not intractable, without status migrainosus: Secondary | ICD-10-CM | POA: Insufficient documentation

## 2015-10-24 DIAGNOSIS — G8929 Other chronic pain: Secondary | ICD-10-CM | POA: Insufficient documentation

## 2015-10-24 DIAGNOSIS — R1084 Generalized abdominal pain: Secondary | ICD-10-CM | POA: Diagnosis not present

## 2015-10-24 DIAGNOSIS — N400 Enlarged prostate without lower urinary tract symptoms: Secondary | ICD-10-CM | POA: Insufficient documentation

## 2015-10-24 DIAGNOSIS — Z86718 Personal history of other venous thrombosis and embolism: Secondary | ICD-10-CM | POA: Diagnosis not present

## 2015-10-24 DIAGNOSIS — I10 Essential (primary) hypertension: Secondary | ICD-10-CM | POA: Insufficient documentation

## 2015-10-24 DIAGNOSIS — Z8739 Personal history of other diseases of the musculoskeletal system and connective tissue: Secondary | ICD-10-CM | POA: Insufficient documentation

## 2015-10-24 DIAGNOSIS — K5900 Constipation, unspecified: Secondary | ICD-10-CM | POA: Diagnosis not present

## 2015-10-24 DIAGNOSIS — R109 Unspecified abdominal pain: Secondary | ICD-10-CM

## 2015-10-24 DIAGNOSIS — R1011 Right upper quadrant pain: Secondary | ICD-10-CM | POA: Diagnosis not present

## 2015-10-24 LAB — URINALYSIS, ROUTINE W REFLEX MICROSCOPIC
Bilirubin Urine: NEGATIVE
Glucose, UA: NEGATIVE mg/dL
Hgb urine dipstick: NEGATIVE
Ketones, ur: NEGATIVE mg/dL
Leukocytes, UA: NEGATIVE
Nitrite: NEGATIVE
Protein, ur: NEGATIVE mg/dL
Specific Gravity, Urine: 1.008 (ref 1.005–1.030)
pH: 7.5 (ref 5.0–8.0)

## 2015-10-24 LAB — COMPREHENSIVE METABOLIC PANEL
ALT: 21 U/L (ref 17–63)
AST: 25 U/L (ref 15–41)
Albumin: 4.3 g/dL (ref 3.5–5.0)
Alkaline Phosphatase: 63 U/L (ref 38–126)
Anion gap: 8 (ref 5–15)
BUN: 21 mg/dL — ABNORMAL HIGH (ref 6–20)
CO2: 26 mmol/L (ref 22–32)
Calcium: 9.5 mg/dL (ref 8.9–10.3)
Chloride: 105 mmol/L (ref 101–111)
Creatinine, Ser: 0.98 mg/dL (ref 0.61–1.24)
GFR calc Af Amer: >60 mL/min (ref >60)
GFR calc non Af Amer: >60 mL/min (ref >60)
Glucose, Bld: 102 mg/dL — ABNORMAL HIGH (ref 65–99)
Potassium: 4.5 mmol/L (ref 3.5–5.1)
Sodium: 139 mmol/L (ref 135–145)
Total Bilirubin: 1 mg/dL (ref 0.3–1.2)
Total Protein: 6.9 g/dL (ref 6.5–8.1)

## 2015-10-24 LAB — CBC
HCT: 42.7 % (ref 39.0–52.0)
Hemoglobin: 14.3 g/dL (ref 13.0–17.0)
MCH: 29.3 pg (ref 26.0–34.0)
MCHC: 33.5 g/dL (ref 30.0–36.0)
MCV: 87.5 fL (ref 78.0–100.0)
Platelets: 389 10*3/uL (ref 150–400)
RBC: 4.88 MIL/uL (ref 4.22–5.81)
RDW: 14.2 % (ref 11.5–15.5)
WBC: 9.4 10*3/uL (ref 4.0–10.5)

## 2015-10-24 LAB — LIPASE, BLOOD: Lipase: 25 U/L (ref 11–51)

## 2015-10-24 MED ORDER — CYCLOBENZAPRINE HCL 5 MG PO TABS: 10.0000 mg | ORAL_TABLET | Freq: Three times a day (TID) | ORAL | Status: DC | PRN

## 2015-10-24 MED ORDER — IOHEXOL 300 MG/ML  SOLN
25.0000 mL | Freq: Once | INTRAMUSCULAR | Status: AC | PRN
  Administered 2015-10-24: 25 mL via INTRAVENOUS

## 2015-10-24 MED ORDER — IOHEXOL 300 MG/ML  SOLN
100.0000 mL | Freq: Once | INTRAMUSCULAR | Status: AC | PRN
  Administered 2015-10-24: 100 mL via INTRAVENOUS

## 2015-10-24 NOTE — Progress Notes (Signed)
Pre visit review using our clinic review tool, if applicable. No additional management support is needed unless otherwise documented below in the visit note. 

## 2015-10-24 NOTE — ED Notes (Signed)
Pt notified this RN that he would be taking his 10mg  oxycodone from home. Dr. Lacinda Axon notified. Pt was not certain of the drug name but thinks it is oxycodone but feels it could be hydrocodone.

## 2015-10-24 NOTE — ED Notes (Signed)
Call from doctor office reported that this pt is coming to the ER to be seen for rule out diverticulosis or bowel obstruction. Pt is hypertensive and febrile, right side back pain.

## 2015-10-24 NOTE — Patient Instructions (Addendum)
Good to see you  I am sorry you are hurting so much  I called [facility] and they will get you in now.  Keep me updated. If everything is OK then I will send in a muscle relaxer.

## 2015-10-24 NOTE — Discharge Instructions (Signed)
Flank Pain °Flank pain refers to pain that is located on the side of the body between the upper abdomen and the back. The pain may occur over a short period of time (acute) or may be long-term or reoccurring (chronic). It may be mild or severe. Flank pain can be caused by many things. °CAUSES  °Some of the more common causes of flank pain include: °· Muscle strains.   °· Muscle spasms.   °· A disease of your spine (vertebral disk disease).   °· A lung infection (pneumonia).   °· Fluid around your lungs (pulmonary edema).   °· A kidney infection.   °· Kidney stones.   °· A very painful skin rash caused by the chickenpox virus (shingles).   °· Gallbladder disease.   °HOME CARE INSTRUCTIONS  °Home care will depend on the cause of your pain. In general, °· Rest as directed by your caregiver. °· Drink enough fluids to keep your urine clear or pale yellow. °· Only take over-the-counter or prescription medicines as directed by your caregiver. Some medicines may help relieve the pain. °· Tell your caregiver about any changes in your pain. °· Follow up with your caregiver as directed. °SEEK IMMEDIATE MEDICAL CARE IF:  °· Your pain is not controlled with medicine.   °· You have new or worsening symptoms. °· Your pain increases.   °· You have abdominal pain.   °· You have shortness of breath.   °· You have persistent nausea or vomiting.   °· You have swelling in your abdomen.   °· You feel faint or pass out.   °· You have blood in your urine. °· You have a fever or persistent symptoms for more than 2-3 days. °· You have a fever and your symptoms suddenly get worse. °MAKE SURE YOU:  °· Understand these instructions. °· Will watch your condition. °· Will get help right away if you are not doing well or get worse. °  °This information is not intended to replace advice given to you by your health care provider. Make sure you discuss any questions you have with your health care provider. °  °Document Released: 09/20/2005 Document  Revised: 04/23/2012 Document Reviewed: 03/13/2012 °Elsevier Interactive Patient Education ©2016 [employer] Inc[employer] ° °

## 2015-10-24 NOTE — ED Provider Notes (Addendum)
CSN: [id number]     Arrival date & time 10/24/15  1315 History   First MD Initiated Contact with Patient 10/24/15 1811     Chief Complaint  Patient presents with  . Abdominal Pain  . Flank Pain     Patient is a 82 y.o. male presenting with abdominal pain and flank pain. The history is provided by the patient.  Abdominal Pain Associated symptoms: constipation   Associated symptoms: no chest pain, no diarrhea, no nausea, no shortness of breath and no vomiting   Flank Pain Associated symptoms include abdominal pain. Pertinent negatives include no chest pain, no headaches and no shortness of breath.  Patient presents for right sided flank pain. He was seen in by his sports medicine doctor. Pain for the last week. Worse with certain movements. Sent in for CT. Felt better after having bowel movement. Pain masked by his chronic pain meds per patient. No dysuria. Pain is dull and constant.   Past Medical History  Diagnosis Date  . ACTINIC KERATOSIS, HEAD 03/23/2008  . ALLERGIC RHINITIS 03/27/2007  . BENIGN PROSTATIC HYPERTROPHY, WITH URINARY OBSTRUCTION 10/16/2007  . CARPAL TUNNEL SYNDROME, LEFT 04/13/2008  . ERECTILE DYSFUNCTION, ORGANIC 02/20/2010  . Gout, unspecified 02/20/2010  . HYPERLIPIDEMIA, WITH HIGH HDL 10/18/2008  . KNEE PAIN 10/10/2009  . LOW BACK PAIN 03/27/2007  . NEOPLASM, MALIGNANT, SKIN, TRUNK 05/09/2007  . Patellar tendinitis 10/10/2009  . SKIN CANCER, HX OF 03/27/2007  . UNS ADVRS EFF UNS RX MEDICINAL&BIOLOGICAL SBSTNC 08/18/2007  . HYPERTENSION 05/09/2007    sees Dr. Benay Pillow  . Headache(784.0)     migraines hx of  . Chronic neck pain   . Chronic back pain   . CAD 08/18/2007    "patient denies any issues with heart, does not see cardiologist"  . Urinary urgency   . History of melanoma   . Arthritis   . DVT (deep venous thrombosis) [facility])    Past Surgical History  Procedure Laterality Date  . Knee arthroscopy    . Cholecystectomy    . Nasal sinus surgery    . Vasectomy     . Lumbar fusions    . Joint replacement surgery    . Rectal fissure    . Lateral fusion lumbar spine, transverse    . Laminectomy    . Lumbar fusion    . Rectal fissure    . Vitrectomy      right and left 2013  . Carpal tunnel release    . Eye surgery      bilateral cataracts removed and vitrectomies  . Blepharoplasty    . Back surgery    . Posterior cervical fusion/foraminotomy Right 10/01/2012    Procedure: POSTERIOR CERVICAL FUSION/FORAMINOTOMY LEVEL 1;  Surgeon: Hosie Spangle, MD;  Location: [place] [facility];  Service: Neurosurgery;  Laterality: Right;  Right Cervical seven thoracic one Cervical laminectomy/foraminotomy with posterior cervical arthrodesis   . Joint replacement Right     knee  . Appendectomy  1969  . Lumbar laminectomy/decompression microdiscectomy Left 11/03/2012    Procedure: LUMBAR LAMINECTOMY/DECOMPRESSION MICRODISCECTOMY 1 LEVEL;  Surgeon: Hosie Spangle, MD;  Location: [place] [facility];  Service: Neurosurgery;  Laterality: Left;  LEFT L5S1 laminotomy foraminotomy and possible microdiskectomy  . Melanoma excision    . Posterior lumbar fusion N/A 05/23/2013    Procedure: exploration of lumbar wound, explantation of left interbody implant.;  Surgeon: Hosie Spangle, MD;  Location: [place];  Service: Neurosurgery;  Laterality: N/A;  . Esophagogastroduodenoscopy (egd)  with propofol N/A 04/26/2015    Procedure: ESOPHAGOGASTRODUODENOSCOPY (EGD) WITH PROPOFOL;  Surgeon: Inda Castle, MD;  Location: [facility];  Service: Endoscopy;  Laterality: N/A;   Family History  Problem Relation Age of Onset  . Dementia Mother   . Anemia Father   . Heart disease Father    Social History  Substance Use Topics  . Smoking status: Former Smoker -- 1.00 packs/day for 33 years    Quit date: 09/29/1981  . Smokeless tobacco: None  . Alcohol Use: 1.8 oz/week    3 Standard drinks or equivalent per week     Comment: daily    Review of Systems  Constitutional: Negative for  activity change.  Eyes: Negative for pain.  Respiratory: Negative for chest tightness and shortness of breath.   Cardiovascular: Negative for chest pain and leg swelling.  Gastrointestinal: Positive for abdominal pain and constipation. Negative for nausea, vomiting and diarrhea.  Genitourinary: Positive for flank pain.  Musculoskeletal: Negative for back pain and neck stiffness.  Skin: Negative for rash.  Neurological: Negative for weakness, numbness and headaches.  Psychiatric/Behavioral: Negative for behavioral problems.      Allergies  Gabapentin and Lyrica  Home Medications   Prior to Admission medications   Medication Sig Start Date End Date Taking? Authorizing Provider  amLODipine (NORVASC) 5 MG tablet TAKE 1 TABLET (5 MG TOTAL) BY MOUTH DAILY. 06/27/15  Yes Hoyt Koch, MD  carvedilol (COREG) 25 MG tablet Take 1 tablet (25 mg total) by mouth 2 (two) times daily with a meal. 12/10/14  Yes Hoyt Koch, MD  finasteride (PROSCAR) 5 MG tablet Take 1 tablet (5 mg total) by mouth daily. 09/16/15  Yes Hoyt Koch, MD  losartan (COZAAR) 50 MG tablet TAKE 1 TABLET (50 MG TOTAL) BY MOUTH DAILY. 07/11/15  Yes Hoyt Koch, MD  magnesium hydroxide (MILK OF MAGNESIA) 800 MG/5ML suspension Take 15 mLs by mouth every other day.   Yes Historical Provider, MD  Multiple Vitamin (MULTIVITAMIN WITH MINERALS) TABS tablet Take 1 tablet by mouth daily.   Yes Historical Provider, MD  Oxycodone HCl 10 MG TABS Take 10 mg by mouth 3 (three) times daily as needed (pain).  07/14/14  Yes Historical Provider, MD  tamsulosin (FLOMAX) 0.4 MG CAPS capsule TAKE 1 CAPSULE (0.4 MG TOTAL) BY MOUTH DAILY. 09/19/15  Yes Hoyt Koch, MD  benzonatate (TESSALON) 100 MG capsule Take 1 capsule (100 mg total) by mouth 2 (two) times daily as needed for cough. Patient not taking: Reported on 10/24/2015 09/30/15   Hoyt Koch, MD  cyclobenzaprine (FLEXERIL) 5 MG tablet Take 2 tablets  (10 mg total) by mouth 3 (three) times daily as needed for muscle spasms. 10/24/15   Davonna Belling, MD  fluticasone (FLONASE) 50 MCG/ACT nasal spray Place 2 sprays into both nostrils daily. Patient not taking: Reported on 10/24/2015 09/30/15   Hoyt Koch, MD  predniSONE (DELTASONE) 10 MG tablet TAKE 1 TABLET BY MOUTH EVERY DAY WITH BREAKFAST Patient not taking: Reported on 10/24/2015 07/28/15   Hoyt Koch, MD  triamcinolone cream (KENALOG) 0.5 % USE AS DIRECTED Patient not taking: Reported on 10/24/2015 11/23/14   Hoyt Koch, MD   BP 159/102 mmHg  Pulse 95  Temp(Src) 98.2 F (36.8 C) (Oral)  Resp 18  SpO2 95% Physical Exam  Constitutional: He appears well-developed.  HENT:  Head: Atraumatic.  Neck: Neck supple.  Cardiovascular: Normal rate.   Pulmonary/Chest: Effort normal.  Abdominal:  Tenderness  right lateral lower flank pain. No rash. Mild abdominal distention. Mild abdominal tenderness diffusely.   Genitourinary:  No CVA tenderness.   Musculoskeletal: Normal range of motion. He exhibits no edema.  Neurological: He is alert.  Skin: Skin is warm.    ED Course  Procedures (including critical care time) Labs Review Labs Reviewed  COMPREHENSIVE METABOLIC PANEL - Abnormal; Notable for the following:    Glucose, Bld 102 (*)    BUN 21 (*)    All other components within normal limits  LIPASE, BLOOD  CBC  URINALYSIS, ROUTINE W REFLEX MICROSCOPIC (NOT AT [facility])    Imaging Review Ct Abdomen Pelvis W Contrast  10/24/2015  CLINICAL DATA:  Acute onset of severe right-sided abdominal pain. Initial encounter. EXAM: CT ABDOMEN AND PELVIS WITH CONTRAST TECHNIQUE: Multidetector CT imaging of the abdomen and pelvis was performed using the standard protocol following bolus administration of intravenous contrast. CONTRAST:  188mL OMNIPAQUE IOHEXOL 300 MG/ML  SOLN COMPARISON:  CT of the lumbar spine performed 05/20/2014 FINDINGS: Minimal scarring is noted at the  lung bases. Prominence of the intrahepatic biliary ducts and common bile duct likely remains within normal limits status post cholecystectomy. Clips are noted at the gallbladder fossa. The liver and spleen are otherwise unremarkable. The pancreas and adrenal glands are unremarkable. Mild bilateral renal atrophy is noted. Nonspecific perinephric stranding is noted bilaterally. A few left renal parapelvic cysts are seen. There is no evidence of hydronephrosis. No renal or ureteral stones are seen. No free fluid is identified. The small bowel is unremarkable in appearance. The stomach is within normal limits. No acute vascular abnormalities are seen. Relatively diffuse calcification is seen along the abdominal aorta and its branches. The patient is status post appendectomy. The colon is unremarkable in appearance. The bladder is mildly distended and grossly unremarkable. The prostate remains normal in size. No inguinal lymphadenopathy is seen. No acute osseous abnormalities are identified. There is chronic loss of height at L5, with grade 2 anterolisthesis of L5 on S1, and underlying fusion hardware at L5-S1. IMPRESSION: 1. No acute abnormality seen within the abdomen or pelvis. 2. Mild bilateral renal atrophy noted. Few small left renal parapelvic cysts seen. 3. Relatively diffuse calcification along the abdominal aorta and its branches. 4. Chronic loss of height at L5, with grade 2 anterolisthesis of L5 on S1, and underlying fusion hardware at L5-S1. Electronically Signed   By: Garald Balding M.D.   On: 10/24/2015 21:09   I have personally reviewed and evaluated these images and lab results as part of my medical decision-making.   EKG Interpretation None      MDM   Final diagnoses:  Flank pain    Flank pain. CT scan and lab work reassuring. Will discharge home. Patient artery has chronic pain medicines.    Davonna Belling, MD 10/25/15 [place], MD 10/25/15 [phone]

## 2015-10-24 NOTE — Assessment & Plan Note (Signed)
Severe right-sided abdominal pain noted. Patient does have some involuntary guarding. Bowel sounds are hypoactive in the right lower quadrant. Concern for possible bowel obstruction versus diverticulitis. Patient does not have any type of temperature at this point. Patient though is in severe pain that is not responding to his chronic opioids. I do think that this could be contribute in. We discussed with patient at great detail. Patient electing to be sent to the emergency to permit for further evaluation. I do believe the patient may be needing a KUB as well as a CT scan. We will get labs to make sure there is no and excess etiology. Discussed with patient of this is all normal and we can do further workup for muscle skeletal complaints but it does not appear on physical exam that this is consistent.  Spent  25 minutes with patient face-to-face and had greater than 50% of counseling including as described above in assessment and plan.

## 2015-10-24 NOTE — Progress Notes (Signed)
[name] [address] [place], [place] [address] Phone: [phone] Subjective:      CC: Right sided hip pain  [id number] [name] is a 82 y.o. male coming in with complaint of right-sided hip pain. Patient has had this for quite some time. Has a history of spinal fusion at L5-S1. Patient states Worsening right-sided back pain. States that seems to be severe. Started there and seems to radiate down the lateral aspect of the hips and going toward the groin. Denies any type of rash. Patient states it feels better when he states still. Patient states he only thing that he had relief was within he took some medication to cause him to go to the bathroom. Patient is on chronic opioids. Patient states that the pain is so severe he does not think he is going to be able to tolerate a much longer. Rates the severity of 10 out of 10. States that this is worse than his previous back symptoms with radicular symptoms. Denies any fevers. Patient's blood pressure is significantly elevated today.      Past Medical History  Diagnosis Date  . ACTINIC KERATOSIS, HEAD 03/23/2008  . ALLERGIC RHINITIS 03/27/2007  . BENIGN PROSTATIC HYPERTROPHY, WITH URINARY OBSTRUCTION 10/16/2007  . CARPAL TUNNEL SYNDROME, LEFT 04/13/2008  . ERECTILE DYSFUNCTION, ORGANIC 02/20/2010  . Gout, unspecified 02/20/2010  . HYPERLIPIDEMIA, WITH HIGH HDL 10/18/2008  . KNEE PAIN 10/10/2009  . LOW BACK PAIN 03/27/2007  . NEOPLASM, MALIGNANT, SKIN, TRUNK 05/09/2007  . Patellar tendinitis 10/10/2009  . SKIN CANCER, HX OF 03/27/2007  . UNS ADVRS EFF UNS RX MEDICINAL&BIOLOGICAL SBSTNC 08/18/2007  . HYPERTENSION 05/09/2007    sees Dr. Benay Pillow  . Headache(784.0)     migraines hx of  . Chronic neck pain   . Chronic back pain   . CAD 08/18/2007    "patient denies any issues with heart, does not see cardiologist"  . Urinary urgency   . History of melanoma   . Arthritis   . DVT (deep venous thrombosis)  [facility])    Past Surgical History  Procedure Laterality Date  . Knee arthroscopy    . Cholecystectomy    . Nasal sinus surgery    . Vasectomy    . Lumbar fusions    . Joint replacement surgery    . Rectal fissure    . Lateral fusion lumbar spine, transverse    . Laminectomy    . Lumbar fusion    . Rectal fissure    . Vitrectomy      right and left 2013  . Carpal tunnel release    . Eye surgery      bilateral cataracts removed and vitrectomies  . Blepharoplasty    . Back surgery    . Posterior cervical fusion/foraminotomy Right 10/01/2012    Procedure: POSTERIOR CERVICAL FUSION/FORAMINOTOMY LEVEL 1;  Surgeon: Hosie Spangle, MD;  Location: [place] [facility];  Service: Neurosurgery;  Laterality: Right;  Right Cervical seven thoracic one Cervical laminectomy/foraminotomy with posterior cervical arthrodesis   . Joint replacement Right     knee  . Appendectomy  1969  . Lumbar laminectomy/decompression microdiscectomy Left 11/03/2012    Procedure: LUMBAR LAMINECTOMY/DECOMPRESSION MICRODISCECTOMY 1 LEVEL;  Surgeon: Hosie Spangle, MD;  Location: [place] [facility];  Service: Neurosurgery;  Laterality: Left;  LEFT L5S1 laminotomy foraminotomy and possible microdiskectomy  . Melanoma excision    . Posterior lumbar fusion N/A 05/23/2013    Procedure: exploration of lumbar  wound, explantation of left interbody implant.;  Surgeon: Hosie Spangle, MD;  Location: [place];  Service: Neurosurgery;  Laterality: N/A;  . Esophagogastroduodenoscopy (egd) with propofol N/A 04/26/2015    Procedure: ESOPHAGOGASTRODUODENOSCOPY (EGD) WITH PROPOFOL;  Surgeon: Inda Castle, MD;  Location: [facility];  Service: Endoscopy;  Laterality: N/A;   Social History  Substance Use Topics  . Smoking status: Former Smoker -- 1.00 packs/day for 33 years    Quit date: 09/29/1981  . Smokeless tobacco: None  . Alcohol Use: 1.8 oz/week    3 Standard drinks or equivalent per week     Comment: daily   Allergies  Allergen  Reactions  . Gabapentin     Lower extremity edema   Family History  Problem Relation Age of Onset  . Dementia Mother   . Anemia Father   . Heart disease Father      Past medical history, social, surgical and family history all reviewed in electronic medical record.   Review of Systems: No headache, visual changes, nausea, vomiting, diarrhea, constipation, dizziness, abdominal pain, skin rash, fevers, chills, night sweats, weight loss, swollen lymph nodes, body aches, joint swelling, muscle aches, chest pain, shortness of breath, mood changes.   Objective Blood pressure 180/98, pulse 90, height 6' (1.829 m), weight 208 lb (94.348 kg), SpO2 93 %.  General: No apparent distress alert and oriented x3 mood and affect normal, dressed appropriately. Appears significantly uncomfortable HEENT: Pupils equal, extraocular movements intact  Respiratory: Patient's speak in full sentences and does not appear short of breath  Cardiovascular: No lower extremity edema, non tender, no erythema  Skin: Warm dry intact with no signs of infection or rash on extremities or on axial skeleton.  Abdomen: Patient does have mild distention of the abdomen with involuntary guarding. Positive pain to percussion of the posterior back. No rebound signs. Bowel sounds minorly hypoactive on the right compared to the other 3 quadrants. Neuro: Cranial nerves II through XII are intact, neurovascularly intact in all extremities with 2+ DTRs and 2+ pulses.  Lymph: No lymphadenopathy of posterior or anterior cervical chain or axillae bilaterally.  Gait normal with good balance and coordination.  MSK:  Non tender with full range of motion and good stability and symmetric strength and tone of shoulders, elbows, wrist, hip, and ankles bilaterally.  Back Exam:  Inspection: Postsurgical changes noted. Patient's incision well-healed. Motion: Flexion 25 deg, Extension 15 deg, Side Bending to 35 deg bilaterally,  Rotation to 35 deg  bilaterally  SLR laying: Negative today XSLR laying: Negative  Palpable tenderness: Tender over the L2-L4 on the right side appears palmar musculature FABER: negative. Sensory change: Gross sensation intact to all lumbar and sacral dermatomes.  Reflexes: 2+ at both patellar tendons, 2+ at achilles tendons, Babinski's downgoing.  Strength at foot  Plantar-flexion: 5/5 Dorsi-flexion: 5/5 Eversion: 5/5 Inversion: 5/5  Leg strength  Quad: 5/5 Hamstring: 5/5 Hip flexor: 5/5 Hip abductors: 5/5  Gait unremarkable.        Impression and Recommendations:     This case required medical decision making of moderate complexity.

## 2015-10-26 ENCOUNTER — Encounter: Payer: Self-pay | Admitting: Family Medicine

## 2015-10-27 ENCOUNTER — Encounter: Payer: Self-pay | Admitting: Family Medicine

## 2015-10-27 NOTE — Telephone Encounter (Signed)
Pt called to check up on this request, can Dr. Sharlet Salina help send this med in since Dr. Tamala Julian is not going to be here tomorrow.

## 2015-10-28 ENCOUNTER — Encounter: Payer: Self-pay | Admitting: Internal Medicine

## 2015-10-28 ENCOUNTER — Other Ambulatory Visit: Payer: Self-pay

## 2015-10-28 MED ORDER — VALACYCLOVIR HCL 1 G PO TABS: 1000.0000 mg | ORAL_TABLET | Freq: Two times a day (BID) | ORAL | Status: DC

## 2015-10-29 ENCOUNTER — Encounter: Payer: Self-pay | Admitting: Family Medicine

## 2015-10-29 MED ORDER — ACYCLOVIR 400 MG PO TABS: 400.0000 mg | ORAL_TABLET | Freq: Two times a day (BID) | ORAL | Status: DC

## 2015-10-31 ENCOUNTER — Encounter: Payer: Self-pay | Admitting: Internal Medicine

## 2015-10-31 ENCOUNTER — Ambulatory Visit (INDEPENDENT_AMBULATORY_CARE_PROVIDER_SITE_OTHER): Payer: Medicare Other | Admitting: Family Medicine

## 2015-10-31 ENCOUNTER — Encounter: Payer: Self-pay | Admitting: Family Medicine

## 2015-10-31 ENCOUNTER — Other Ambulatory Visit (INDEPENDENT_AMBULATORY_CARE_PROVIDER_SITE_OTHER): Payer: Medicare Other

## 2015-10-31 VITALS — BP 136/78 | HR 89 | Ht 72.0 in | Wt 209.0 lb

## 2015-10-31 DIAGNOSIS — M25551 Pain in right hip: Secondary | ICD-10-CM

## 2015-10-31 DIAGNOSIS — M7601 Gluteal tendinitis, right hip: Secondary | ICD-10-CM | POA: Insufficient documentation

## 2015-10-31 MED ORDER — NORTRIPTYLINE HCL 10 MG PO CAPS: 10.0000 mg | ORAL_CAPSULE | Freq: Every day | ORAL | Status: DC

## 2015-10-31 NOTE — Patient Instructions (Addendum)
Good to see you glut tendon injected today  We will start nortriptyline at night Send me a message tomorrow Follow up 69 wed. Show front desk.

## 2015-10-31 NOTE — Assessment & Plan Note (Addendum)
Patient given injection today and tolerated the procedure well. We discussed icing regimen and home exercises. We discussed nortriptyline at night if this is more of a referred pain from his back. I do think that that is the most likely problem. If continuing have pain he may be a candidate for an EMG. We will have him following up in 48 hours to make her that he is making improvement. Differential also includes possibly early shingles. Patient will be monitoring for this as well. Hasn't given acyclovir already.

## 2015-10-31 NOTE — Progress Notes (Signed)
[name] [address] [place], [place] [address] Phone: [phone] Subjective:      CC: Right sided flank pain  [id number] [name] is a 82 y.o. male coming in with complaint of right-sided hip pain. Patient has had this for quite some time. Has a history of spinal fusion at L5-S1. Patient states Worsening right-sided back pain. Patient was seen last week and there was a concern with patient having more of a bowel obstruction versus mother gastrointestinal pathology. Patient was sent into the emergency room where he did not have a good experience. Patient did have a CT of his abdomen. This was independently visualized by me today.patient had diffuse calcifications along the abdominal aorta and its branches as well as the chronic loss of height at L5 and grade 2 anterior listhesis of L5 on S1 with fusion at this level. No significant loosening noted. States that he continues to have pain on the lateral aspect. Seems to be on the posterior lateral aspect. Very specific. Sometimes as radiation but very minimal. States that he does not feel as much like his back. Seems to be unrelenting. Still 10 out of 10 pain.       Past Medical History  Diagnosis Date  . ACTINIC KERATOSIS, HEAD 03/23/2008  . ALLERGIC RHINITIS 03/27/2007  . BENIGN PROSTATIC HYPERTROPHY, WITH URINARY OBSTRUCTION 10/16/2007  . CARPAL TUNNEL SYNDROME, LEFT 04/13/2008  . ERECTILE DYSFUNCTION, ORGANIC 02/20/2010  . Gout, unspecified 02/20/2010  . HYPERLIPIDEMIA, WITH HIGH HDL 10/18/2008  . KNEE PAIN 10/10/2009  . LOW BACK PAIN 03/27/2007  . NEOPLASM, MALIGNANT, SKIN, TRUNK 05/09/2007  . Patellar tendinitis 10/10/2009  . SKIN CANCER, HX OF 03/27/2007  . UNS ADVRS EFF UNS RX MEDICINAL&BIOLOGICAL SBSTNC 08/18/2007  . HYPERTENSION 05/09/2007    sees Dr. Benay Pillow  . Headache(784.0)     migraines hx of  . Chronic neck pain   . Chronic back pain   . CAD 08/18/2007    "patient denies any issues  with heart, does not see cardiologist"  . Urinary urgency   . History of melanoma   . Arthritis   . DVT (deep venous thrombosis) [facility])    Past Surgical History  Procedure Laterality Date  . Knee arthroscopy    . Cholecystectomy    . Nasal sinus surgery    . Vasectomy    . Lumbar fusions    . Joint replacement surgery    . Rectal fissure    . Lateral fusion lumbar spine, transverse    . Laminectomy    . Lumbar fusion    . Rectal fissure    . Vitrectomy      right and left 2013  . Carpal tunnel release    . Eye surgery      bilateral cataracts removed and vitrectomies  . Blepharoplasty    . Back surgery    . Posterior cervical fusion/foraminotomy Right 10/01/2012    Procedure: POSTERIOR CERVICAL FUSION/FORAMINOTOMY LEVEL 1;  Surgeon: Hosie Spangle, MD;  Location: [place] [facility];  Service: Neurosurgery;  Laterality: Right;  Right Cervical seven thoracic one Cervical laminectomy/foraminotomy with posterior cervical arthrodesis   . Joint replacement Right     knee  . Appendectomy  1969  . Lumbar laminectomy/decompression microdiscectomy Left 11/03/2012    Procedure: LUMBAR LAMINECTOMY/DECOMPRESSION MICRODISCECTOMY 1 LEVEL;  Surgeon: Hosie Spangle, MD;  Location: [facility];  Service: Neurosurgery;  Laterality: Left;  LEFT L5S1 laminotomy foraminotomy and possible microdiskectomy  .  Melanoma excision    . Posterior lumbar fusion N/A 05/23/2013    Procedure: exploration of lumbar wound, explantation of left interbody implant.;  Surgeon: Hosie Spangle, MD;  Location: [facility];  Service: Neurosurgery;  Laterality: N/A;  . Esophagogastroduodenoscopy (egd) with propofol N/A 04/26/2015    Procedure: ESOPHAGOGASTRODUODENOSCOPY (EGD) WITH PROPOFOL;  Surgeon: Inda Castle, MD;  [facility];  Service: Endoscopy;  Laterality: N/A;   Social History  Substance Use Topics  . Smoking status: Former Smoker -- 1.00 packs/day for 33 years    Quit date: 09/29/1981  . Smokeless  tobacco: None  . Alcohol Use: 1.8 oz/week    3 Standard drinks or equivalent per week     Comment: daily   Allergies  Allergen Reactions  . Gabapentin     Lower extremity edema  . Lyrica [Pregabalin] Swelling   Family History  Problem Relation Age of Onset  . Dementia Mother   . Anemia Father   . Heart disease Father      Past medical history, social, surgical and family history all reviewed in electronic medical record.   Review of Systems: No headache, visual changes, nausea, vomiting, diarrhea, constipation, dizziness, abdominal pain, skin rash, fevers, chills, night sweats, weight loss, swollen lymph nodes, body aches, joint swelling, muscle aches, chest pain, shortness of breath, mood changes.   Objective Blood pressure 136/78, pulse 89, height 6' (1.829 m), weight 209 lb (94.802 kg), SpO2 94 %.  General: No apparent distress alert and oriented x3 mood and affect normal, dressed appropriately. Appears significantly uncomfortable HEENT: Pupils equal, extraocular movements intact  Respiratory: Patient's speak in full sentences and does not appear short of breath  Cardiovascular: No lower extremity edema, non tender, no erythema  Skin: Warm dry intact with no signs of infection or rash on extremities or on axial skeleton.  Abdomen:Abdominal pain seems to be resolved. Still some mild discomfort anterior laterally. Not as severe as previous exam. Neuro: Cranial nerves II through XII are intact, neurovascularly intact in all extremities with 2+ DTRs and 2+ pulses.  Lymph: No lymphadenopathy of posterior or anterior cervical chain or axillae bilaterally.  Gait normal with good balance and coordination.  MSK:  Non tender with full range of motion and good stability and symmetric strength and tone of shoulders, elbows, wrist, hip, and ankles bilaterally.  Back Exam:  Inspection: Postsurgical changes noted. Patient's incision well-healed. Motion: Flexion 25 deg, Extension 15 deg, Side  Bending to 35 deg bilaterally,  Rotation to 35 deg bilaterally  SLR laying: Negative today XSLR laying: Negative  Palpable tenderness: Tender over the L2-L4 on the right side appears Para musculaturemostly over the superior gluteal aspect of the gluteal muscle. Severely tender to palpation. FABER: positive right Sensory change: Gross sensation intact to all lumbar and sacral dermatomes.  Reflexes: 2+ at both patellar tendons, 2+ at achilles tendons, Babinski's downgoing.  Strength at foot  Plantar-flexion: 5/5 Dorsi-flexion: 5/5 Eversion: 5/5 Inversion: 5/5  Leg strength  Quad: 5/5 Hamstring: 5/5 Hip flexor: 5/5 Hip abductors: 4/5 on right compared to full strength on the left Gait unremarkable.  Procedure: Real-time Ultrasound Guided Injection of gluteal tendon sheath right side Device: GE Logiq E  Ultrasound guided injection is preferred based studies that show increased duration, increased effect, greater accuracy, decreased procedural pain, increased response rate, and decreased cost with ultrasound guided versus blind injection.  Verbal informed consent obtained.  Time-out conducted.  Noted no overlying erythema, induration, or other signs of local infection.  Skin prepped in a sterile fashion.  Local anesthesia: Topical Ethyl chloride.  With sterile technique and under real time ultrasound guidance:  With a 21-gauge 2 inch needle patient was injected with a total of 1 mL of 0.5% Marcaine and 1 mL of Kenalog 40 mg/dL. Completed without difficulty  Pain immediately improvedsuggesting accurate placement of the medication.  Advised to call if fevers/chills, erythema, induration, drainage, or persistent bleeding.  Images permanently stored and available for review in the ultrasound unit.  Impression: Technically successful ultrasound guided injection.      Impression and Recommendations:     This case required medical decision making of moderate complexity.

## 2015-10-31 NOTE — Progress Notes (Signed)
Pre visit review using our clinic review tool, if applicable. No additional management support is needed unless otherwise documented below in the visit note. 

## 2015-11-01 ENCOUNTER — Encounter: Payer: Self-pay | Admitting: Family Medicine

## 2015-11-02 ENCOUNTER — Encounter: Payer: Self-pay | Admitting: Family Medicine

## 2015-11-03 ENCOUNTER — Other Ambulatory Visit: Payer: Self-pay | Admitting: Family Medicine

## 2015-11-03 ENCOUNTER — Encounter: Payer: Self-pay | Admitting: Family Medicine

## 2015-11-03 MED ORDER — PREDNISONE 50 MG PO TABS: 50.0000 mg | ORAL_TABLET | Freq: Every day | ORAL | Status: DC

## 2015-11-09 ENCOUNTER — Encounter: Payer: Self-pay | Admitting: Family Medicine

## 2015-11-10 ENCOUNTER — Encounter: Payer: Self-pay | Admitting: Internal Medicine

## 2015-11-10 NOTE — Telephone Encounter (Signed)
Can you call and get him in Friday or Saturday clinic.

## 2015-11-11 ENCOUNTER — Encounter: Payer: Self-pay | Admitting: Internal Medicine

## 2015-11-12 ENCOUNTER — Encounter: Payer: Self-pay | Admitting: Family Medicine

## 2015-11-12 ENCOUNTER — Ambulatory Visit (INDEPENDENT_AMBULATORY_CARE_PROVIDER_SITE_OTHER): Payer: Medicare Other | Admitting: Family Medicine

## 2015-11-12 VITALS — BP 130/70 | HR 86 | Temp 98.7°F | Wt 206.0 lb

## 2015-11-12 DIAGNOSIS — B349 Viral infection, unspecified: Secondary | ICD-10-CM

## 2015-11-12 DIAGNOSIS — B9789 Other viral agents as the cause of diseases classified elsewhere: Secondary | ICD-10-CM | POA: Insufficient documentation

## 2015-11-12 DIAGNOSIS — J988 Other specified respiratory disorders: Principal | ICD-10-CM

## 2015-11-12 MED ORDER — BENZONATATE 200 MG PO CAPS: 200.0000 mg | ORAL_CAPSULE | Freq: Two times a day (BID) | ORAL | Status: DC | PRN

## 2015-11-12 NOTE — Progress Notes (Signed)
Pre visit review using our clinic review tool, if applicable. No additional management support is needed unless otherwise documented below in the visit note. 

## 2015-11-12 NOTE — Assessment & Plan Note (Signed)
Symptoms most consistent with viral upper respiratory illness and possible viral bronchitis. Benign exam. Vital signs are stable. Discussed potential treatments. We'll treat with Tessalon, Flonase, and plain Mucinex. Patient will continue to monitor symptoms. He is given return precautions.

## 2015-11-12 NOTE — Progress Notes (Signed)
  Tommi Rumps, MD Phone: [phone]  [name] is a 82 y.o. male who presents today for same-day visit.  Patient reports 5 days of chest congestion, rhinorrhea, postnasal drip, and mildly productive cough at night. No shortness of breath or chest pain. No fevers. No ear discomfort. He does not endorse any sick contacts. He has taken prednisone that he had at home for this without much benefit. Notes in the past Tessalon and Flonase is helped with his symptoms.  PMH: Former smoker  ROS see history of present illness  Objective  Physical Exam Filed Vitals:   11/12/15 1230  BP: 130/70  Pulse: 86  Temp: 98.7 F (37.1 C)    BP Readings from Last 3 Encounters:  11/12/15 130/70  10/31/15 136/78  10/24/15 159/102   Wt Readings from Last 3 Encounters:  11/12/15 206 lb (93.441 kg)  10/31/15 209 lb (94.802 kg)  10/24/15 208 lb (94.348 kg)    Physical Exam  Constitutional: He is well-developed, well-nourished, and in no distress.  HENT:  Head: Normocephalic and atraumatic.  Right Ear: External ear normal.  Left Ear: External ear normal.  Mouth/Throat: Oropharynx is clear and moist. No oropharyngeal exudate.  Eyes: Conjunctivae are normal. Pupils are equal, round, and reactive to light.  Neck: Neck supple.  Cardiovascular: Normal rate, regular rhythm and normal heart sounds.   Pulmonary/Chest: Effort normal and breath sounds normal. No respiratory distress. He has no wheezes. He has no rales.  Lymphadenopathy:    He has no cervical adenopathy.  Neurological: He is alert. Gait normal.  Skin: Skin is warm and dry. He is not diaphoretic.     Assessment/Plan: Please see individual problem list.  Viral respiratory illness Symptoms most consistent with viral upper respiratory illness and possible viral bronchitis. Benign exam. Vital signs are stable. Discussed potential treatments. We'll treat with Tessalon, Flonase, and plain Mucinex. Patient will continue to monitor  symptoms. He is given return precautions.    No orders of the defined types were placed in this encounter.    Meds ordered this encounter  Medications  . benzonatate (TESSALON) 200 MG capsule    Sig: Take 1 capsule (200 mg total) by mouth 2 (two) times daily as needed for cough.    Dispense:  20 capsule    Refill:  0    Tommi Rumps, MD [place]

## 2015-11-12 NOTE — Patient Instructions (Addendum)
Nice to meet you. Your symptoms are likely related to a virus.  We will treat you with over the counter flonase 2 sprays in each nostril and plain mucinex.  Please use the tessalon for cough. If you develop chest pain, shortness of breath, cough productive of blood, or fevers please seek medical attention.

## 2015-11-16 DIAGNOSIS — M5440 Lumbago with sciatica, unspecified side: Secondary | ICD-10-CM | POA: Diagnosis not present

## 2015-11-16 DIAGNOSIS — Z6827 Body mass index (BMI) 27.0-27.9, adult: Secondary | ICD-10-CM | POA: Diagnosis not present

## 2015-11-16 DIAGNOSIS — M4806 Spinal stenosis, lumbar region: Secondary | ICD-10-CM | POA: Diagnosis not present

## 2015-11-16 DIAGNOSIS — M4726 Other spondylosis with radiculopathy, lumbar region: Secondary | ICD-10-CM | POA: Diagnosis not present

## 2015-11-16 DIAGNOSIS — I10 Essential (primary) hypertension: Secondary | ICD-10-CM | POA: Diagnosis not present

## 2015-11-16 DIAGNOSIS — M5136 Other intervertebral disc degeneration, lumbar region: Secondary | ICD-10-CM | POA: Diagnosis not present

## 2015-11-23 ENCOUNTER — Other Ambulatory Visit: Payer: Self-pay | Admitting: Internal Medicine

## 2015-11-28 ENCOUNTER — Encounter: Payer: Self-pay | Admitting: Internal Medicine

## 2015-11-30 ENCOUNTER — Encounter: Payer: Self-pay | Admitting: Internal Medicine

## 2015-12-01 DIAGNOSIS — M4726 Other spondylosis with radiculopathy, lumbar region: Secondary | ICD-10-CM | POA: Diagnosis not present

## 2015-12-01 DIAGNOSIS — M5116 Intervertebral disc disorders with radiculopathy, lumbar region: Secondary | ICD-10-CM | POA: Diagnosis not present

## 2015-12-01 DIAGNOSIS — M5416 Radiculopathy, lumbar region: Secondary | ICD-10-CM | POA: Diagnosis not present

## 2015-12-01 DIAGNOSIS — M4806 Spinal stenosis, lumbar region: Secondary | ICD-10-CM | POA: Diagnosis not present

## 2015-12-16 ENCOUNTER — Encounter: Payer: Self-pay | Admitting: Family Medicine

## 2015-12-22 ENCOUNTER — Other Ambulatory Visit: Payer: Self-pay | Admitting: Internal Medicine

## 2016-01-01 ENCOUNTER — Encounter: Payer: Self-pay | Admitting: Family Medicine

## 2016-01-03 ENCOUNTER — Other Ambulatory Visit: Payer: Self-pay

## 2016-01-03 ENCOUNTER — Encounter: Payer: Self-pay | Admitting: Family Medicine

## 2016-01-03 ENCOUNTER — Other Ambulatory Visit (INDEPENDENT_AMBULATORY_CARE_PROVIDER_SITE_OTHER): Payer: Medicare Other

## 2016-01-03 ENCOUNTER — Ambulatory Visit (INDEPENDENT_AMBULATORY_CARE_PROVIDER_SITE_OTHER): Payer: Medicare Other | Admitting: Family Medicine

## 2016-01-03 VITALS — BP 118/78 | HR 86 | Wt 212.0 lb

## 2016-01-03 DIAGNOSIS — M19012 Primary osteoarthritis, left shoulder: Secondary | ICD-10-CM

## 2016-01-03 DIAGNOSIS — M2550 Pain in unspecified joint: Secondary | ICD-10-CM | POA: Diagnosis not present

## 2016-01-03 DIAGNOSIS — M130 Polyarthritis, unspecified: Secondary | ICD-10-CM | POA: Diagnosis not present

## 2016-01-03 DIAGNOSIS — M67912 Unspecified disorder of synovium and tendon, left shoulder: Secondary | ICD-10-CM

## 2016-01-03 DIAGNOSIS — M25512 Pain in left shoulder: Secondary | ICD-10-CM

## 2016-01-03 DIAGNOSIS — M67919 Unspecified disorder of synovium and tendon, unspecified shoulder: Secondary | ICD-10-CM | POA: Insufficient documentation

## 2016-01-03 DIAGNOSIS — M5410 Radiculopathy, site unspecified: Secondary | ICD-10-CM

## 2016-01-03 DIAGNOSIS — M1990 Unspecified osteoarthritis, unspecified site: Secondary | ICD-10-CM | POA: Diagnosis not present

## 2016-01-03 DIAGNOSIS — G8929 Other chronic pain: Secondary | ICD-10-CM

## 2016-01-03 DIAGNOSIS — M5416 Radiculopathy, lumbar region: Secondary | ICD-10-CM

## 2016-01-03 LAB — CBC WITH DIFFERENTIAL/PLATELET
Basophils Absolute: 0 10*3/uL (ref 0.0–0.1)
Basophils Relative: 0.3 % (ref 0.0–3.0)
Eosinophils Absolute: 0.2 10*3/uL (ref 0.0–0.7)
Eosinophils Relative: 3.3 % (ref 0.0–5.0)
HCT: 41.2 % (ref 39.0–52.0)
Hemoglobin: 13.9 g/dL (ref 13.0–17.0)
Lymphocytes Relative: 18.7 % (ref 12.0–46.0)
Lymphs Abs: 1.4 10*3/uL (ref 0.7–4.0)
MCHC: 33.8 g/dL (ref 30.0–36.0)
MCV: 87.5 fl (ref 78.0–100.0)
Monocytes Absolute: 0.8 10*3/uL (ref 0.1–1.0)
Monocytes Relative: 11.3 % (ref 3.0–12.0)
Neutro Abs: 4.9 10*3/uL (ref 1.4–7.7)
Neutrophils Relative %: 66.4 % (ref 43.0–77.0)
Platelets: 386 10*3/uL (ref 150.0–400.0)
RBC: 4.71 Mil/uL (ref 4.22–5.81)
RDW: 14.8 % (ref 11.5–15.5)
WBC: 7.4 10*3/uL (ref 4.0–10.5)

## 2016-01-03 LAB — IBC PANEL
Iron: 39 ug/dL — ABNORMAL LOW (ref 42–165)
Saturation Ratios: 12.3 % — ABNORMAL LOW (ref 20.0–50.0)
Transferrin: 227 mg/dL (ref 212.0–360.0)

## 2016-01-03 LAB — SEDIMENTATION RATE: Sed Rate: 16 mm/hr (ref 0–20)

## 2016-01-03 NOTE — Patient Instructions (Signed)
Good to see you  Ice is your friend We will get labs today  Try the nortriptyline again at night, if you need a refill write me I hope the injecton in the [facility] joint helps, but see me again in 2 weeks and if not better we will try to inject the shoulder joint itself.

## 2016-01-03 NOTE — Assessment & Plan Note (Signed)
Injections today, also has RTC degenerative tear.  Will montior.  Given topical NSAIDs, discussed activities to avoid.  RTC in 2 weeks ad if worsening symptoms then will consider injection in shoulder.

## 2016-01-03 NOTE — Assessment & Plan Note (Signed)
Worsening pains again, will recheck ESR, ANA and CBC.   If normal likely arthritis.

## 2016-01-03 NOTE — Progress Notes (Signed)
[name] [name], [place] [address] Phone: [phone] Subjective:      CC: Left shoulder pain  [id number] [name] is a 82 y.o. male coming in with complaint of cramps as well as left shoulder pain. Seen worse last 2 weeks, unable to dress self easy.  Working self up at night with pain. No radiation of pain, no numbness no weakness.  Seems if he puts pressure on the shoulder worse pain.  8/10 severity. Nothing except being still seems to help.    Has a history of spinal fusion at L5-S1. Patient states cramps seem to be more in the lower extremities. Continue to have pain on a regular basis. Still can affect daily activities.  Still takes pain medicine on a regular basis as well. Was seeing neurosurgery and had epidural recently. Some better with radicular symptoms but not with the pain.       Past Medical History  Diagnosis Date  . ACTINIC KERATOSIS, HEAD 03/23/2008  . ALLERGIC RHINITIS 03/27/2007  . BENIGN PROSTATIC HYPERTROPHY, WITH URINARY OBSTRUCTION 10/16/2007  . CARPAL TUNNEL SYNDROME, LEFT 04/13/2008  . ERECTILE DYSFUNCTION, ORGANIC 02/20/2010  . Gout, unspecified 02/20/2010  . HYPERLIPIDEMIA, WITH HIGH HDL 10/18/2008  . KNEE PAIN 10/10/2009  . LOW BACK PAIN 03/27/2007  . NEOPLASM, MALIGNANT, SKIN, TRUNK 05/09/2007  . Patellar tendinitis 10/10/2009  . SKIN CANCER, HX OF 03/27/2007  . UNS ADVRS EFF UNS RX MEDICINAL&BIOLOGICAL SBSTNC 08/18/2007  . HYPERTENSION 05/09/2007    sees Dr. Benay Pillow  . Headache(784.0)     migraines hx of  . Chronic neck pain   . Chronic back pain   . CAD 08/18/2007    "patient denies any issues with heart, does not see cardiologist"  . Urinary urgency   . History of melanoma   . Arthritis   . DVT (deep venous thrombosis) [name])    Past Surgical History  Procedure Laterality Date  . Knee arthroscopy    . Cholecystectomy    . Nasal sinus surgery    . Vasectomy    . Lumbar fusions    . Joint  replacement surgery    . Rectal fissure    . Lateral fusion lumbar spine, transverse    . Laminectomy    . Lumbar fusion    . Rectal fissure    . Vitrectomy      right and left 2013  . Carpal tunnel release    . Eye surgery      bilateral cataracts removed and vitrectomies  . Blepharoplasty    . Back surgery    . Posterior cervical fusion/foraminotomy Right 10/01/2012    Procedure: POSTERIOR CERVICAL FUSION/FORAMINOTOMY LEVEL 1;  Surgeon: Hosie Spangle, MD;  Location: [facility];  Service: Neurosurgery;  Laterality: Right;  Right Cervical seven thoracic one Cervical laminectomy/foraminotomy with posterior cervical arthrodesis   . Joint replacement Right     knee  . Appendectomy  1969  . Lumbar laminectomy/decompression microdiscectomy Left 11/03/2012    Procedure: LUMBAR LAMINECTOMY/DECOMPRESSION MICRODISCECTOMY 1 LEVEL;  Surgeon: Hosie Spangle, MD;  Location: [facility];  Service: Neurosurgery;  Laterality: Left;  LEFT L5S1 laminotomy foraminotomy and possible microdiskectomy  . Melanoma excision    . Posterior lumbar fusion N/A 05/23/2013    Procedure: exploration of lumbar wound, explantation of left interbody implant.;  Surgeon: Hosie Spangle, MD;  Location: [facility];  Service: Neurosurgery;  Laterality: N/A;  . Esophagogastroduodenoscopy (egd) with propofol  N/A 04/26/2015    Procedure: ESOPHAGOGASTRODUODENOSCOPY (EGD) WITH PROPOFOL;  Surgeon: Inda Castle, MD;  Location: [facility];  Service: Endoscopy;  Laterality: N/A;   Social History  Substance Use Topics  . Smoking status: Former Smoker -- 1.00 packs/day for 33 years    Quit date: 09/29/1981  . Smokeless tobacco: None  . Alcohol Use: 1.8 oz/week    3 Standard drinks or equivalent per week     Comment: daily   Allergies  Allergen Reactions  . Gabapentin     Lower extremity edema  . Lyrica [Pregabalin] Swelling   Family History  Problem Relation Age of Onset  . Dementia Mother   . Anemia Father   .  Heart disease Father      Past medical history, social, surgical and family history all reviewed in electronic medical record.   Review of Systems: No headache, visual changes, nausea, vomiting, diarrhea, constipation, dizziness, abdominal pain, skin rash, fevers, chills, night sweats, weight loss, swollen lymph nodes, body aches, joint swelling, muscle aches, chest pain, shortness of breath, mood changes.   Objective Blood pressure 118/78, pulse 86, weight 212 lb (96.163 kg).  General: No apparent distress alert and oriented x3 mood and affect normal, dressed appropriately. Appears significantly uncomfortable HEENT: Pupils equal, extraocular movements intact  Respiratory: Patient's speak in full sentences and does not appear short of breath  Cardiovascular: No lower extremity edema, non tender, no erythema  Skin: Warm dry intact with no signs of infection or rash on extremities or on axial skeleton.  Abdomen:Abdominal pain seems to be resolved. Still some mild discomfort anterior laterally. Not as severe as previous exam. Neuro: Cranial nerves II through XII are intact, neurovascularly intact in all extremities with 2+ DTRs and 2+ pulses.  Lymph: No lymphadenopathy of posterior or anterior cervical chain or axillae bilaterally.  Gait normal with good balance and coordination.  MSK:  Non tender with full range of motion and good stability and symmetric strength and tone of , elbows, wrist, hip, and ankles bilaterally.  Back Exam:  Inspection: Postsurgical changes noted. Patient's incision well-healed. Motion: Flexion 25 deg, Extension 15 deg, Side Bending to 35 deg bilaterally,  Rotation to 35 deg bilaterally  SLR laying: Negative today XSLR laying: Negative  Palpable tenderness: Continued tenderness still in paraspinal musculature of lumbar spine. Marland Kitchen FABER: positive right Sensory change: Gross sensation intact to all lumbar and sacral dermatomes.  Reflexes: 2+ at both patellar tendons,  2+ at achilles tendons, Babinski's downgoing.  Strength at foot  Plantar-flexion: 5/5 Dorsi-flexion: 5/5 Eversion: 5/5 Inversion: 5/5  Leg strength  Quad: 5/5 Hamstring: 5/5 Hip flexor: 5/5 Hip abductors: 4/5 on right compared to full strength on the left Gait unremarkable.   Shoulder: left Inspection reveals no abnormalities, atrophy or asymmetry. Severe pain over AC joint.  ROM is full in all planes passively. Rotator cuff strength normal throughout. signs of impingement with positive Neer and Hawkin's tests, but negative empty can sign. Speeds and Yergason's tests normal. No labral pathology noted with negative Obrien's, negative clunk and good stability. Severe pain with cross over.  Normal scapular function observed. No painful arc and no drop arm sign. No apprehension sign  MSK US performed of: left This study was ordered, performed, and interpreted by Charlann Boxer D.O.  Shoulder:   Supraspinatus:  Degenerative changes but no retraction or acute tear., minimal bursitis  Infraspinatus:  Appears normal on long and transverse views. Significant increase in Doppler flow Subscapularis:  Degenerative tear but  no reatraction. Marland Kitchen Positive bursa Teres Minor:  Appears normal on long and transverse views. AC joint:  Severe OA with spurring and effusion.  Glenohumeral Joint:  Appears normal without effusion. Glenoid Labrum:  Intact without visualized tears. Biceps Tendon:  Appears normal on long and transverse views, no fraying of tendon, tendon located in intertubercular groove, no subluxation with shoulder internal or external rotation.  Impression: Chronic acromioclavicular arthritis, degenerative rotator cuff.   Procedure: Real-time Ultrasound Guided Injection of left Acromiocalvicular joint.  Device: GE Logiq E  Ultrasound guided injection is preferred based studies that show increased duration, increased effect, greater accuracy, decreased procedural pain, increased response rate  with ultrasound guided versus blind injection.  Verbal informed consent obtained.  Time-out conducted.  Noted no overlying erythema, induration, or other signs of local infection.  Skin prepped in a sterile fashion.  Local anesthesia: Topical Ethyl chloride.  With sterile technique and under real time ultrasound guidance:  Joint visualized.  23g 1  inch needle inserted superiorapproach. Pictures taken for needle placement. Patient did have injection of  1 cc of 0.5% Marcaine, and 1.0 cc of Kenalog 40 mg/dL. Completed without difficulty  Pain immediately resolved suggesting accurate placement of the medication.  Advised to call if fevers/chills, erythema, induration, drainage, or persistent bleeding.  Images permanently stored and available for review in the ultrasound unit.  Impression: Technically successful ultrasound guided injection.      Impression and Recommendations:     This case required medical decision making of moderate complexity.

## 2016-01-03 NOTE — Progress Notes (Signed)
Pre visit review using our clinic review tool, if applicable. No additional management support is needed unless otherwise documented below in the visit note. 

## 2016-01-03 NOTE — Assessment & Plan Note (Signed)
No worsening, seeing neurosurgery.  Recent epidural with mild improvement per patient. Will allow surgeon to continue to run point.

## 2016-01-03 NOTE — Assessment & Plan Note (Signed)
Deg tear, will monitor if still pain at follow up will do injection.

## 2016-01-04 MED ORDER — NORTRIPTYLINE HCL 10 MG PO CAPS
10.0000 mg | ORAL_CAPSULE | Freq: Every day | ORAL | Status: DC
Start: 2016-01-04 — End: 2016-04-20

## 2016-01-05 ENCOUNTER — Encounter: Payer: Self-pay | Admitting: Family Medicine

## 2016-01-05 LAB — ANA: Anti Nuclear Antibody(ANA): NEGATIVE

## 2016-01-10 ENCOUNTER — Other Ambulatory Visit: Payer: Self-pay | Admitting: Internal Medicine

## 2016-01-18 ENCOUNTER — Ambulatory Visit (INDEPENDENT_AMBULATORY_CARE_PROVIDER_SITE_OTHER): Payer: Medicare Other | Admitting: Family Medicine

## 2016-01-18 ENCOUNTER — Encounter: Payer: Self-pay | Admitting: Family Medicine

## 2016-01-18 VITALS — BP 122/78 | HR 88 | Wt 210.0 lb

## 2016-01-18 DIAGNOSIS — M25512 Pain in left shoulder: Secondary | ICD-10-CM

## 2016-01-18 DIAGNOSIS — M5410 Radiculopathy, site unspecified: Secondary | ICD-10-CM

## 2016-01-18 DIAGNOSIS — M1990 Unspecified osteoarthritis, unspecified site: Secondary | ICD-10-CM

## 2016-01-18 DIAGNOSIS — M5416 Radiculopathy, lumbar region: Secondary | ICD-10-CM

## 2016-01-18 DIAGNOSIS — M67912 Unspecified disorder of synovium and tendon, left shoulder: Secondary | ICD-10-CM | POA: Diagnosis not present

## 2016-01-18 DIAGNOSIS — G8929 Other chronic pain: Secondary | ICD-10-CM

## 2016-01-18 DIAGNOSIS — M19012 Primary osteoarthritis, left shoulder: Secondary | ICD-10-CM

## 2016-01-18 MED ORDER — BACLOFEN 10 MG PO TABS: 10.0000 mg | ORAL_TABLET | Freq: Three times a day (TID) | ORAL | Status: DC | PRN

## 2016-01-18 NOTE — Progress Notes (Signed)
[name] [address] [place], [place] [address] Phone: [phone] Subjective:      CC: Left shoulder pain  [id number] [name] is a 82 y.o. male coming in with complaint of cramps as well as left shoulder pain. Was having more of a acromioclavicular arthritic flare. Patient was given an injection. States he has made some improvement but continues to have pain more on the posterior aspect of the shoulder. States that it seems to be more muscle tightness. Does have a history of cervical fusion previously. States that this more with movement. Once to avoid other medications if possible.    Has a history of spinal fusion at L5-S1. Patient states cramps seem to be more in the lower extremities. Continue to have pain on a regular basis. Still can affect daily activities.  Still takes pain medicine on a regular basis as well.  Patient continues to have the cramping and does not think that the iron supplementation has been very beneficial. Patient's last epidural he states with the month ago but I do not have this information.      Past Medical History  Diagnosis Date  . ACTINIC KERATOSIS, HEAD 03/23/2008  . ALLERGIC RHINITIS 03/27/2007  . BENIGN PROSTATIC HYPERTROPHY, WITH URINARY OBSTRUCTION 10/16/2007  . CARPAL TUNNEL SYNDROME, LEFT 04/13/2008  . ERECTILE DYSFUNCTION, ORGANIC 02/20/2010  . Gout, unspecified 02/20/2010  . HYPERLIPIDEMIA, WITH HIGH HDL 10/18/2008  . KNEE PAIN 10/10/2009  . LOW BACK PAIN 03/27/2007  . NEOPLASM, MALIGNANT, SKIN, TRUNK 05/09/2007  . Patellar tendinitis 10/10/2009  . SKIN CANCER, HX OF 03/27/2007  . UNS ADVRS EFF UNS RX MEDICINAL&BIOLOGICAL SBSTNC 08/18/2007  . HYPERTENSION 05/09/2007    sees Dr. Benay Pillow  . Headache(784.0)     migraines hx of  . Chronic neck pain   . Chronic back pain   . CAD 08/18/2007    "patient denies any issues with heart, does not see cardiologist"  . Urinary urgency   . History of melanoma   .  Arthritis   . DVT (deep venous thrombosis) [facility])    Past Surgical History  Procedure Laterality Date  . Knee arthroscopy    . Cholecystectomy    . Nasal sinus surgery    . Vasectomy    . Lumbar fusions    . Joint replacement surgery    . Rectal fissure    . Lateral fusion lumbar spine, transverse    . Laminectomy    . Lumbar fusion    . Rectal fissure    . Vitrectomy      right and left 2013  . Carpal tunnel release    . Eye surgery      bilateral cataracts removed and vitrectomies  . Blepharoplasty    . Back surgery    . Posterior cervical fusion/foraminotomy Right 10/01/2012    Procedure: POSTERIOR CERVICAL FUSION/FORAMINOTOMY LEVEL 1;  Surgeon: Hosie Spangle, MD;  Location: [facility];  Service: Neurosurgery;  Laterality: Right;  Right Cervical seven thoracic one Cervical laminectomy/foraminotomy with posterior cervical arthrodesis   . Joint replacement Right     knee  . Appendectomy  1969  . Lumbar laminectomy/decompression microdiscectomy Left 11/03/2012    Procedure: LUMBAR LAMINECTOMY/DECOMPRESSION MICRODISCECTOMY 1 LEVEL;  Surgeon: Hosie Spangle, MD;  Location: [facility];  Service: Neurosurgery;  Laterality: Left;  LEFT L5S1 laminotomy foraminotomy and possible microdiskectomy  . Melanoma excision    . Posterior lumbar fusion N/A 05/23/2013  Procedure: exploration of lumbar wound, explantation of left interbody implant.;  Surgeon: Hosie Spangle, MD;  Location: [facility];  Service: Neurosurgery;  Laterality: N/A;  . Esophagogastroduodenoscopy (egd) with propofol N/A 04/26/2015    Procedure: ESOPHAGOGASTRODUODENOSCOPY (EGD) WITH PROPOFOL;  Surgeon: Inda Castle, MD;  Location: [facility];  Service: Endoscopy;  Laterality: N/A;   Social History  Substance Use Topics  . Smoking status: Former Smoker -- 1.00 packs/day for 33 years    Quit date: 09/29/1981  . Smokeless tobacco: Not on file  . Alcohol Use: 1.8 oz/week    3 Standard drinks or equivalent per  week     Comment: daily   Allergies  Allergen Reactions  . Gabapentin     Lower extremity edema  . Lyrica [Pregabalin] Swelling   Family History  Problem Relation Age of Onset  . Dementia Mother   . Anemia Father   . Heart disease Father      Past medical history, social, surgical and family history all reviewed in electronic medical record.   Review of Systems: No headache, visual changes, nausea, vomiting, diarrhea, constipation, dizziness, abdominal pain, skin rash, fevers, chills, night sweats, weight loss, swollen lymph nodes, body aches, joint swelling, muscle aches, chest pain, shortness of breath, mood changes.   Objective There were no vitals taken for this visit.  General: No apparent distress alert and oriented x3 mood and affect normal, dressed appropriately. Appears significantly uncomfortable HEENT: Pupils equal, extraocular movements intact  Respiratory: Patient's speak in full sentences and does not appear short of breath  Cardiovascular: No lower extremity edema, non tender, no erythema  Skin: Warm dry intact with no signs of infection or rash on extremities or on axial skeleton.  Abdomen:Abdominal pain seems to be resolved. Still some mild discomfort anterior laterally. Not as severe as previous exam. Neuro: Cranial nerves II through XII are intact, neurovascularly intact in all extremities with 2+ DTRs and 2+ pulses.  Lymph: No lymphadenopathy of posterior or anterior cervical chain or axillae bilaterally.  Gait normal with good balance and coordination.  MSK:  Non tender with full range of motion and good stability and symmetric strength and tone of , elbows, wrist, hip, and ankles bilaterally. Some muscle wasting noted of the hands bilaterally Back Exam:  Inspection: Postsurgical changes noted. Patient's incision well-healed. Motion: Flexion 25 deg, Extension 15 deg, Side Bending to 35 deg bilaterally,  Rotation to 35 deg bilaterally  SLR laying: Negative  today XSLR laying: Negative  Palpable tenderness: Continued mild tenderness of the paraspinal musculature mostly of the lumbar spine on the left side. FABER: positive right Sensory change: Gross sensation intact to all lumbar and sacral dermatomes.  Reflexes: 2+ at both patellar tendons, 2+ at achilles tendons, Babinski's downgoing.  Strength at foot  Plantar-flexion: 5/5 Dorsi-flexion: 5/5 Eversion: 5/5 Inversion: 5/5  Leg strength  Quad: 5/5 Hamstring: 5/5 Hip flexor: 5/5 Hip abductors: 4/5 on right compared to full strength on the left Gait unremarkable.   Shoulder: left Inspection reveals no abnormalities, atrophy or asymmetry. Minimal pain over the acromial clavicular joint ROM is full in all planes passively. Rotator cuff strength normal throughout. Mild signs of impingement. Severe tenderness to palpation over the trapezius on the left side and has 3 distinct trigger points Speeds and Yergason's tests normal. No labral pathology noted with negative Obrien's, negative clunk and good stability. Severe pain with cross over.  Normal scapular function observed. No painful arc and no drop arm sign. No apprehension sign  Procedure note After verbal consent patient was prepped with alcohol swabs and with a 25-gauge 1 inch needle patient was injected with a total of 3 mL of 0.5% Marcaine and 1 mL of Kenalog 40 mg/dL in 3 distinct trigger points in the left trapezius with some improvement. Post injection instructions given.     Impression and Recommendations:     This case required medical decision making of moderate complexity.

## 2016-01-18 NOTE — Assessment & Plan Note (Signed)
Patient continued to have trouble in this area I would like to repeat injection in the shoulder itself. Patient has not had that done yet.

## 2016-01-18 NOTE — Assessment & Plan Note (Signed)
Given injections today and tolerated the procedure well. We discussed icing regimen and home exercises. We discussed objective is a doing which ones to avoid. Patient is going to continue to remain active. Discussed with patient's own and I do think some reason muscle spasms. Patient's laboratory workup was fairly unremarkable except for low iron with patient does not want to take on a regular basis.

## 2016-01-18 NOTE — Assessment & Plan Note (Signed)
Better after the injection.

## 2016-01-18 NOTE — Assessment & Plan Note (Signed)
Continues to have chronic radicular symptoms and cramping. I do feel that this is secondary to his back. Patient did have an epidural he states a month ago. I do not have these records. Encourage him to discuss further with his neurosurgeon continues to have difficulty. Patient wants to avoid any other type of surgery. We did discuss the possibility of possible allergy that can be also contributing but patient wants to not take any more medications if possible. Did give him a muscle relaxer to see if this does help with some of the cramping.

## 2016-01-18 NOTE — Patient Instructions (Signed)
Good to se eyo u I am glad we made some progress on the shoulder and hope the tirgger point injections take out the rest  Try the muscle relaxer up to 3 times a day as needed.   I know you have a lot of pills but I think it will work Try Aquaderm on your finger if you can find it or keep it covered.  See me again in 4 weeks or call me if you want another injection in your back.

## 2016-01-20 DIAGNOSIS — Z6828 Body mass index (BMI) 28.0-28.9, adult: Secondary | ICD-10-CM | POA: Diagnosis not present

## 2016-01-20 DIAGNOSIS — M5440 Lumbago with sciatica, unspecified side: Secondary | ICD-10-CM | POA: Diagnosis not present

## 2016-01-20 DIAGNOSIS — M4726 Other spondylosis with radiculopathy, lumbar region: Secondary | ICD-10-CM | POA: Diagnosis not present

## 2016-01-20 DIAGNOSIS — Z981 Arthrodesis status: Secondary | ICD-10-CM | POA: Diagnosis not present

## 2016-01-20 DIAGNOSIS — M5136 Other intervertebral disc degeneration, lumbar region: Secondary | ICD-10-CM | POA: Diagnosis not present

## 2016-01-20 DIAGNOSIS — M4806 Spinal stenosis, lumbar region: Secondary | ICD-10-CM | POA: Diagnosis not present

## 2016-01-27 DIAGNOSIS — M5416 Radiculopathy, lumbar region: Secondary | ICD-10-CM | POA: Diagnosis not present

## 2016-01-27 DIAGNOSIS — M4726 Other spondylosis with radiculopathy, lumbar region: Secondary | ICD-10-CM | POA: Diagnosis not present

## 2016-01-27 DIAGNOSIS — M4806 Spinal stenosis, lumbar region: Secondary | ICD-10-CM | POA: Diagnosis not present

## 2016-01-27 DIAGNOSIS — M5116 Intervertebral disc disorders with radiculopathy, lumbar region: Secondary | ICD-10-CM | POA: Diagnosis not present

## 2016-02-02 ENCOUNTER — Encounter: Payer: Self-pay | Admitting: Family Medicine

## 2016-02-07 ENCOUNTER — Other Ambulatory Visit: Payer: Self-pay | Admitting: Internal Medicine

## 2016-02-15 ENCOUNTER — Other Ambulatory Visit: Payer: Self-pay | Admitting: Internal Medicine

## 2016-02-16 DIAGNOSIS — L570 Actinic keratosis: Secondary | ICD-10-CM | POA: Diagnosis not present

## 2016-02-16 DIAGNOSIS — L814 Other melanin hyperpigmentation: Secondary | ICD-10-CM | POA: Diagnosis not present

## 2016-02-16 DIAGNOSIS — D225 Melanocytic nevi of trunk: Secondary | ICD-10-CM | POA: Diagnosis not present

## 2016-02-16 DIAGNOSIS — D1801 Hemangioma of skin and subcutaneous tissue: Secondary | ICD-10-CM | POA: Diagnosis not present

## 2016-02-16 DIAGNOSIS — L820 Inflamed seborrheic keratosis: Secondary | ICD-10-CM | POA: Diagnosis not present

## 2016-02-16 DIAGNOSIS — L821 Other seborrheic keratosis: Secondary | ICD-10-CM | POA: Diagnosis not present

## 2016-02-23 ENCOUNTER — Other Ambulatory Visit: Payer: Self-pay | Admitting: Internal Medicine

## 2016-02-28 ENCOUNTER — Encounter: Payer: Self-pay | Admitting: Internal Medicine

## 2016-02-28 ENCOUNTER — Encounter: Payer: Self-pay | Admitting: Family Medicine

## 2016-03-05 ENCOUNTER — Encounter: Payer: Self-pay | Admitting: Internal Medicine

## 2016-03-05 MED ORDER — ZOSTER VACCINE LIVE 19400 UNT/0.65ML ~~LOC~~ SUSR: 0.6500 mL | Freq: Once | SUBCUTANEOUS | 0 refills | Status: AC

## 2016-03-09 ENCOUNTER — Other Ambulatory Visit: Payer: Self-pay | Admitting: Internal Medicine

## 2016-03-10 ENCOUNTER — Encounter: Payer: Self-pay | Admitting: Family Medicine

## 2016-03-12 ENCOUNTER — Encounter: Payer: Self-pay | Admitting: Family Medicine

## 2016-03-14 ENCOUNTER — Encounter: Payer: Self-pay | Admitting: Family Medicine

## 2016-03-15 NOTE — Progress Notes (Signed)
[name] [place], [name] [address] Phone: [phone] Subjective:    CC:Bilateral shoulder pain  [id number]  [name] is a 82 y.o. male coming in with complaint of cramps as well as bilateral shoulder.  Patient states that for the last week and then severe. Patient states that they hurt him with almost any type of movement. Denies any neck pain length.       Past Medical History:  Diagnosis Date  . ACTINIC KERATOSIS, HEAD 03/23/2008  . ALLERGIC RHINITIS 03/27/2007  . Arthritis   . BENIGN PROSTATIC HYPERTROPHY, WITH URINARY OBSTRUCTION 10/16/2007  . CAD 08/18/2007   "patient denies any issues with heart, does not see cardiologist"  . CARPAL TUNNEL SYNDROME, LEFT 04/13/2008  . Chronic back pain   . Chronic neck pain   . DVT (deep venous thrombosis) ([facility])   . ERECTILE DYSFUNCTION, ORGANIC 02/20/2010  . Gout, unspecified 02/20/2010  . Headache(784.0)    migraines hx of  . History of melanoma   . HYPERLIPIDEMIA, WITH HIGH HDL 10/18/2008  . HYPERTENSION 05/09/2007   sees Dr. Benay Pillow  . KNEE PAIN 10/10/2009  . LOW BACK PAIN 03/27/2007  . NEOPLASM, MALIGNANT, SKIN, TRUNK 05/09/2007  . Patellar tendinitis 10/10/2009  . SKIN CANCER, HX OF 03/27/2007  . UNS ADVRS EFF UNS RX MEDICINAL&BIOLOGICAL SBSTNC 08/18/2007  . Urinary urgency    Past Surgical History:  Procedure Laterality Date  . APPENDECTOMY  1969  . BACK SURGERY    . BLEPHAROPLASTY    . CARPAL TUNNEL RELEASE    . CHOLECYSTECTOMY    . ESOPHAGOGASTRODUODENOSCOPY (EGD) WITH PROPOFOL N/A 04/26/2015   Procedure: ESOPHAGOGASTRODUODENOSCOPY (EGD) WITH PROPOFOL;  Surgeon: Inda Castle, MD;  Location: [facility];  Service: Endoscopy;  Laterality: N/A;  . EYE SURGERY     bilateral cataracts removed and vitrectomies  . JOINT REPLACEMENT Right    knee  . joint replacement surgery    . KNEE ARTHROSCOPY    . LAMINECTOMY    . LATERAL FUSION LUMBAR SPINE, TRANSVERSE    . LUMBAR  FUSION    . lumbar fusions    . LUMBAR LAMINECTOMY/DECOMPRESSION MICRODISCECTOMY Left 11/03/2012   Procedure: LUMBAR LAMINECTOMY/DECOMPRESSION MICRODISCECTOMY 1 LEVEL;  Surgeon: Hosie Spangle, MD;  Location: [facility];  Service: Neurosurgery;  Laterality: Left;  LEFT L5S1 laminotomy foraminotomy and possible microdiskectomy  . MELANOMA EXCISION    . NASAL SINUS SURGERY    . POSTERIOR CERVICAL FUSION/FORAMINOTOMY Right 10/01/2012   Procedure: POSTERIOR CERVICAL FUSION/FORAMINOTOMY LEVEL 1;  Surgeon: Hosie Spangle, MD;  Location: [facility];  Service: Neurosurgery;  Laterality: Right;  Right Cervical seven thoracic one Cervical laminectomy/foraminotomy with posterior cervical arthrodesis   . POSTERIOR LUMBAR FUSION N/A 05/23/2013   Procedure: exploration of lumbar wound, explantation of left interbody implant.;  Surgeon: Hosie Spangle, MD;  Location: [facility];  Service: Neurosurgery;  Laterality: N/A;  . rectal fissure    . rectal fissure    . VASECTOMY    . VITRECTOMY     right and left 2013   Social History  Substance Use Topics  . Smoking status: Former Smoker    Packs/day: 1.00    Years: 33.00    Quit date: 09/29/1981  . Smokeless tobacco: Not on file  . Alcohol use 1.8 oz/week    3 Standard drinks or equivalent per week     Comment: daily   Allergies  Allergen Reactions  . Gabapentin  Lower extremity edema  . Lyrica [Pregabalin] Swelling   Family History  Problem Relation Age of Onset  . Dementia Mother   . Anemia Father   . Heart disease Father      Past medical history, social, surgical and family history all reviewed in electronic medical record.   Review of Systems: No headache, visual changes, nausea, vomiting, diarrhea, constipation, dizziness, abdominal pain, skin rash, fevers, chills, night sweats, weight loss, swollen lymph nodes, body aches, joint swelling, muscle aches, chest pain, shortness of breath, mood changes.   Objective  There were no  vitals taken for this visit.  General: No apparent distress alert and oriented x3 mood and affect normal, dressed appropriately. Appears significantly uncomfortable HEENT: Pupils equal, extraocular movements intact  Respiratory: Patient's speak in full sentences and does not appear short of breath  Cardiovascular: No lower extremity edema, non tender, no erythema  Skin: Warm dry intact with no signs of infection or rash on extremities or on axial skeleton.  Abdomen:Abdominal pain seems to be resolved. Still some mild discomfort anterior laterally. Not as severe as previous exam. Neuro: Cranial nerves II through XII are intact, neurovascularly intact in all extremities with 2+ DTRs and 2+ pulses.  Lymph: No lymphadenopathy of posterior or anterior cervical chain or axillae bilaterally.  Gait normal with good balance and coordination.  MSK:  Non tender with full range of motion and good stability and symmetric strength and tone of , elbows, wrist, hip, and ankles bilaterally. Some muscle wasting noted of the hands bilaterally B  Shoulder: bilateral Inspection reveals no abnormalities, atrophy or asymmetry. Minimal pain over the acromial clavicular joint Mild decreased range of motion in internal and external rotation bilaterally. 4 out of 5 but symmetric Mild signs of impingement. Severe tenderness to palpation over the trapezius on the left side and has 3 distinct trigger points Speeds and Yergason's tests normal. Positive crossover Severe pain with cross over.  Normal scapular function observed. No painful arc and no drop arm sign. No apprehension sign  Procedure: Real-time Ultrasound Guided Injection of right glenohumeral joint Device: GE Logiq E  Ultrasound guided injection is preferred based studies that show increased duration, increased effect, greater accuracy, decreased procedural pain, increased response rate with ultrasound guided versus blind injection.  Verbal informed  consent obtained.  Time-out conducted.  Noted no overlying erythema, induration, or other signs of local infection.  Skin prepped in a sterile fashion.  Local anesthesia: Topical Ethyl chloride.  With sterile technique and under real time ultrasound guidance:  Joint visualized.  23g 1  inch needle inserted posterior approach. Pictures taken for needle placement. Patient did have injection of 2 cc of 1% lidocaine, 2 cc of 0.5% Marcaine, and 1.0 cc of Kenalog 40 mg/dL. Completed without difficulty  Pain immediately resolved suggesting accurate placement of the medication.  Advised to call if fevers/chills, erythema, induration, drainage, or persistent bleeding.  Images permanently stored and available for review in the ultrasound unit.  Impression: Technically successful ultrasound guided injection.   Procedure: Real-time Ultrasound Guided Injection of left glenohumeral joint Device: GE Logiq E  Ultrasound guided injection is preferred based studies that show increased duration, increased effect, greater accuracy, decreased procedural pain, increased response rate with ultrasound guided versus blind injection.  Verbal informed consent obtained.  Time-out conducted.  Noted no overlying erythema, induration, or other signs of local infection.  Skin prepped in a sterile fashion.  Local anesthesia: Topical Ethyl chloride.  With sterile technique and under real  time ultrasound guidance:  Joint visualized.  23g 1  inch needle inserted posterior approach. Pictures taken for needle placement. Patient did have injection of 2 cc of 1% lidocaine, 2 cc of 0.5% Marcaine, and 1cc of Kenalog 40 mg/dL. Completed without difficulty  Pain immediately resolved suggesting accurate placement of the medication.  Advised to call if fevers/chills, erythema, induration, drainage, or persistent bleeding.  Images permanently stored and available for review in the ultrasound unit.  Impression: Technically successful  ultrasound guided injection.    Impression and Recommendations:     This case required medical decision making of moderate complexity.

## 2016-03-16 ENCOUNTER — Ambulatory Visit (INDEPENDENT_AMBULATORY_CARE_PROVIDER_SITE_OTHER): Payer: Medicare Other | Admitting: Family Medicine

## 2016-03-16 ENCOUNTER — Encounter: Payer: Self-pay | Admitting: Family Medicine

## 2016-03-16 ENCOUNTER — Other Ambulatory Visit: Payer: Self-pay

## 2016-03-16 VITALS — BP 138/76 | HR 102 | Wt 215.0 lb

## 2016-03-16 DIAGNOSIS — M67911 Unspecified disorder of synovium and tendon, right shoulder: Secondary | ICD-10-CM

## 2016-03-16 DIAGNOSIS — D509 Iron deficiency anemia, unspecified: Secondary | ICD-10-CM | POA: Diagnosis not present

## 2016-03-16 DIAGNOSIS — M67912 Unspecified disorder of synovium and tendon, left shoulder: Secondary | ICD-10-CM

## 2016-03-16 DIAGNOSIS — M25512 Pain in left shoulder: Secondary | ICD-10-CM

## 2016-03-16 DIAGNOSIS — M25511 Pain in right shoulder: Secondary | ICD-10-CM

## 2016-03-16 NOTE — Assessment & Plan Note (Signed)
Patient does have iron deficiency anemia. We discussed possibly iron supplementation. Patient declined this again. Discuss of a can help out with his pain as well as fatigue.

## 2016-03-16 NOTE — Assessment & Plan Note (Signed)
Patient given bilateral injections today. We discussed different medications which patient is been fairly noncompliant with. Patient's is more concerned about the potential for prednisone or pain medications. Discuss with him we do not feel comfortable prescribing those chronically. Patient declined referral to pain medication clinic. Patient will follow-up with me again in 10 weeks if any repeat injections. Will return sooner if pain starts again.  Spent  25 minutes with patient face-to-face and had greater than 50% of counseling including as described above in assessment and plan.

## 2016-03-16 NOTE — Patient Instructions (Signed)
Good to see you  Ice will help  Try to stay active I would try nortriptyline 20mg  at night may help and try for 3 nights, if no help then stop again  We can repeat injections every 10 weeks if needed.

## 2016-03-23 DIAGNOSIS — M4726 Other spondylosis with radiculopathy, lumbar region: Secondary | ICD-10-CM | POA: Diagnosis not present

## 2016-03-23 DIAGNOSIS — M5136 Other intervertebral disc degeneration, lumbar region: Secondary | ICD-10-CM | POA: Diagnosis not present

## 2016-03-23 DIAGNOSIS — M4806 Spinal stenosis, lumbar region: Secondary | ICD-10-CM | POA: Diagnosis not present

## 2016-03-23 DIAGNOSIS — I10 Essential (primary) hypertension: Secondary | ICD-10-CM | POA: Diagnosis not present

## 2016-03-23 DIAGNOSIS — Z6828 Body mass index (BMI) 28.0-28.9, adult: Secondary | ICD-10-CM | POA: Diagnosis not present

## 2016-03-23 DIAGNOSIS — M5440 Lumbago with sciatica, unspecified side: Secondary | ICD-10-CM | POA: Diagnosis not present

## 2016-03-27 ENCOUNTER — Encounter: Payer: Self-pay | Admitting: Family Medicine

## 2016-03-29 ENCOUNTER — Other Ambulatory Visit: Payer: Self-pay | Admitting: Internal Medicine

## 2016-04-09 ENCOUNTER — Encounter: Payer: Self-pay | Admitting: Family Medicine

## 2016-04-09 ENCOUNTER — Encounter: Payer: Self-pay | Admitting: Internal Medicine

## 2016-04-09 DIAGNOSIS — N400 Enlarged prostate without lower urinary tract symptoms: Secondary | ICD-10-CM

## 2016-04-10 MED ORDER — TAMSULOSIN HCL 0.4 MG PO CAPS: ORAL_CAPSULE | ORAL | 6 refills | Status: DC

## 2016-04-16 ENCOUNTER — Encounter: Payer: Self-pay | Admitting: Internal Medicine

## 2016-04-20 ENCOUNTER — Encounter: Payer: Self-pay | Admitting: Internal Medicine

## 2016-04-20 ENCOUNTER — Other Ambulatory Visit (INDEPENDENT_AMBULATORY_CARE_PROVIDER_SITE_OTHER): Payer: Medicare Other

## 2016-04-20 ENCOUNTER — Ambulatory Visit (INDEPENDENT_AMBULATORY_CARE_PROVIDER_SITE_OTHER): Payer: Medicare Other | Admitting: Internal Medicine

## 2016-04-20 VITALS — BP 120/70 | HR 90 | Temp 98.3°F | Resp 14 | Ht 72.0 in | Wt 212.0 lb

## 2016-04-20 DIAGNOSIS — M130 Polyarthritis, unspecified: Secondary | ICD-10-CM

## 2016-04-20 DIAGNOSIS — N400 Enlarged prostate without lower urinary tract symptoms: Secondary | ICD-10-CM | POA: Diagnosis not present

## 2016-04-20 DIAGNOSIS — R0600 Dyspnea, unspecified: Secondary | ICD-10-CM | POA: Diagnosis not present

## 2016-04-20 DIAGNOSIS — E538 Deficiency of other specified B group vitamins: Secondary | ICD-10-CM

## 2016-04-20 DIAGNOSIS — Z23 Encounter for immunization: Secondary | ICD-10-CM | POA: Diagnosis not present

## 2016-04-20 DIAGNOSIS — M25511 Pain in right shoulder: Secondary | ICD-10-CM | POA: Diagnosis not present

## 2016-04-20 DIAGNOSIS — M25512 Pain in left shoulder: Secondary | ICD-10-CM

## 2016-04-20 LAB — COMPREHENSIVE METABOLIC PANEL
ALT: 26 U/L (ref 0–53)
AST: 18 U/L (ref 0–37)
Albumin: 3.7 g/dL (ref 3.5–5.2)
Alkaline Phosphatase: 55 U/L (ref 39–117)
BUN: 21 mg/dL (ref 6–23)
CO2: 29 mEq/L (ref 19–32)
Calcium: 8.9 mg/dL (ref 8.4–10.5)
Chloride: 105 mEq/L (ref 96–112)
Creatinine, Ser: 1 mg/dL (ref 0.40–1.50)
GFR: 75.91 mL/min (ref >60.00)
Glucose, Bld: 107 mg/dL — ABNORMAL HIGH (ref 70–99)
Potassium: 4.1 mEq/L (ref 3.5–5.1)
Sodium: 138 mEq/L (ref 135–145)
Total Bilirubin: 0.6 mg/dL (ref 0.2–1.2)
Total Protein: 6.3 g/dL (ref 6.0–8.3)

## 2016-04-20 LAB — T4, FREE: Free T4: 1.13 ng/dL (ref 0.60–1.60)

## 2016-04-20 LAB — CK: Total CK: 54 U/L (ref 7–232)

## 2016-04-20 LAB — VITAMIN B12: Vitamin B-12: >1500 pg/mL — ABNORMAL HIGH (ref 211–911)

## 2016-04-20 LAB — TSH: TSH: 1.43 u[IU]/mL (ref 0.35–4.50)

## 2016-04-20 LAB — BRAIN NATRIURETIC PEPTIDE: Pro B Natriuretic peptide (BNP): 120 pg/mL — ABNORMAL HIGH (ref 0.0–100.0)

## 2016-04-20 MED ORDER — CYANOCOBALAMIN 1000 MCG/ML IJ SOLN
1000.0000 ug | Freq: Once | INTRAMUSCULAR | Status: AC
  Administered 2016-04-20: 1000 ug via INTRAMUSCULAR

## 2016-04-20 NOTE — Assessment & Plan Note (Signed)
Refer to orthopedic surgery for the bilateral shoulder pain especially given recurrence of symptoms so close to injections.

## 2016-04-20 NOTE — Assessment & Plan Note (Signed)
Checking CK, BNP, CMP, CBC, and B12, thyroid function for the energy and the dyspnea. Given the leg swelling needs further investigation. Depending on results treat as appropriate.

## 2016-04-20 NOTE — Assessment & Plan Note (Signed)
Referral to urology for poorly controlled symptoms. He is taking flomax and finasteride already.

## 2016-04-20 NOTE — Progress Notes (Signed)
   Subjective:    Patient ID: [name], male    DOB: January 17, 1932, 82 y.o.   MRN: [id number]  HPI The patient is an 82 YO man coming in for fatigue and dyspnea. He is struggling with it for the last 4-6 weeks or so. Denies depression. Sleeping well. Some swelling increase in his ankles. Not much appetite as well. Has put on some weight due to not doing as much with recent increase in shoulder pain. He is seeing sports medicine and recently got injection but re-injured the shoulders shortly after and they are in miserable pain for him all the time. Rates it 7-8/10.   Review of Systems  Constitutional: Positive for activity change and fatigue. Negative for appetite change, fever and unexpected weight change.  HENT: Negative.   Eyes: Negative.   Respiratory: Positive for shortness of breath. Negative for chest tightness.   Cardiovascular: Negative for chest pain, palpitations and leg swelling.  Gastrointestinal: Negative for abdominal distention, abdominal pain, constipation, diarrhea and nausea.  Genitourinary: Positive for enuresis and frequency.  Musculoskeletal: Positive for arthralgias, gait problem and myalgias.  Skin: Negative.   Psychiatric/Behavioral: Negative.       Objective:   Physical Exam  Constitutional: He is oriented to person, place, and time. He appears well-developed and well-nourished.  HENT:  Head: Normocephalic and atraumatic.  Neck: Normal range of motion.  Cardiovascular: Normal rate and regular rhythm.   Pulmonary/Chest: Effort normal and breath sounds normal. No respiratory distress. He has no wheezes. He has no rales.  Abdominal: Soft. Bowel sounds are normal. He exhibits distension. There is no tenderness. There is no rebound.  Some minimal distention versus adiposity.   Musculoskeletal: He exhibits tenderness.  Both shoulders.   Neurological: He is alert and oriented to person, place, and time. Coordination normal.  Skin: Skin is warm and dry.    Psychiatric: He has a normal mood and affect.   Vitals:   04/20/16 1033  BP: 120/70  Pulse: 90  Resp: 14  Temp: 98.3 F (36.8 C)  TempSrc: Oral  SpO2: 97%  Weight: 212 lb (96.2 kg)  Height: 6' (1.829 m)      Assessment & Plan:  B12 shot given at visit.

## 2016-04-20 NOTE — Patient Instructions (Signed)
We have given you a b12 shot today. We are also checking the levels to see if you need replacement.   We are checking the labs to look for the problem causing the tiredness.

## 2016-04-20 NOTE — Progress Notes (Signed)
Pre visit review using our clinic review tool, if applicable. No additional management support is needed unless otherwise documented below in the visit note. 

## 2016-04-23 ENCOUNTER — Encounter: Payer: Self-pay | Admitting: Internal Medicine

## 2016-04-26 ENCOUNTER — Encounter: Payer: Self-pay | Admitting: Internal Medicine

## 2016-04-26 ENCOUNTER — Other Ambulatory Visit: Payer: Self-pay | Admitting: Internal Medicine

## 2016-04-28 ENCOUNTER — Encounter: Payer: Self-pay | Admitting: Internal Medicine

## 2016-04-30 ENCOUNTER — Ambulatory Visit (INDEPENDENT_AMBULATORY_CARE_PROVIDER_SITE_OTHER): Payer: Medicare Other | Admitting: Family Medicine

## 2016-04-30 ENCOUNTER — Ambulatory Visit (INDEPENDENT_AMBULATORY_CARE_PROVIDER_SITE_OTHER): Payer: Medicare Other

## 2016-04-30 DIAGNOSIS — E538 Deficiency of other specified B group vitamins: Secondary | ICD-10-CM

## 2016-04-30 DIAGNOSIS — M67911 Unspecified disorder of synovium and tendon, right shoulder: Secondary | ICD-10-CM

## 2016-04-30 MED ORDER — CYANOCOBALAMIN 1000 MCG/ML IJ SOLN
1000.0000 ug | Freq: Once | INTRAMUSCULAR | Status: AC
  Administered 2016-04-30: 1000 ug via INTRAMUSCULAR

## 2016-04-30 NOTE — Progress Notes (Signed)
Medical treatment/procedure(s) were performed by non-physician practitioner and as supervising physician I was immediately available for consultation/collaboration. I agree with above. Elizabeth A Crawford, MD  

## 2016-04-30 NOTE — Assessment & Plan Note (Signed)
Patient given injection today. We tolerated procedure well. We discussed icing regimen and home exercises. We discussed which activities to do in which ones to potentially avoid. Patient will come back and see me again in 10 weeks. If worsening symptoms we will repeat injection. Likely significant arthropathy and possible x-rays will be needed.

## 2016-04-30 NOTE — Progress Notes (Signed)
[name] [place], [place] [address] Phone: [phone] Subjective:    CC:Bilateral shoulder pain  [id number]  [name] is a 82 y.o. male coming in with complaint of cramps as well as bilateral shoulder.  Patient states that he is only having more of a right shoulder pain. States that it seems to be severe. Can then he may need to have surgical intervention at some point. Patient wants to avoid this until after Thanksgiving. Patient is wondering if he can have another injection for pain relief. Is traveling in November and does not think he be able to do that at this time.        Past Medical History:  Diagnosis Date  . ACTINIC KERATOSIS, HEAD 03/23/2008  . ALLERGIC RHINITIS 03/27/2007  . Arthritis   . BENIGN PROSTATIC HYPERTROPHY, WITH URINARY OBSTRUCTION 10/16/2007  . CAD 08/18/2007   "patient denies any issues with heart, does not see cardiologist"  . CARPAL TUNNEL SYNDROME, LEFT 04/13/2008  . Chronic back pain   . Chronic neck pain   . DVT (deep venous thrombosis) ([facility])   . ERECTILE DYSFUNCTION, ORGANIC 02/20/2010  . Gout, unspecified 02/20/2010  . Headache(784.0)    migraines hx of  . History of melanoma   . HYPERLIPIDEMIA, WITH HIGH HDL 10/18/2008  . HYPERTENSION 05/09/2007   sees Dr. Benay Pillow  . KNEE PAIN 10/10/2009  . LOW BACK PAIN 03/27/2007  . NEOPLASM, MALIGNANT, SKIN, TRUNK 05/09/2007  . Patellar tendinitis 10/10/2009  . SKIN CANCER, HX OF 03/27/2007  . UNS ADVRS EFF UNS RX MEDICINAL&BIOLOGICAL SBSTNC 08/18/2007  . Urinary urgency    Past Surgical History:  Procedure Laterality Date  . APPENDECTOMY  1969  . BACK SURGERY    . BLEPHAROPLASTY    . CARPAL TUNNEL RELEASE    . CHOLECYSTECTOMY    . ESOPHAGOGASTRODUODENOSCOPY (EGD) WITH PROPOFOL N/A 04/26/2015   Procedure: ESOPHAGOGASTRODUODENOSCOPY (EGD) WITH PROPOFOL;  Surgeon: Inda Castle, MD;  Location: [facility];  Service: Endoscopy;  Laterality: N/A;  . EYE  SURGERY     bilateral cataracts removed and vitrectomies  . JOINT REPLACEMENT Right    knee  . joint replacement surgery    . KNEE ARTHROSCOPY    . LAMINECTOMY    . LATERAL FUSION LUMBAR SPINE, TRANSVERSE    . LUMBAR FUSION    . lumbar fusions    . LUMBAR LAMINECTOMY/DECOMPRESSION MICRODISCECTOMY Left 11/03/2012   Procedure: LUMBAR LAMINECTOMY/DECOMPRESSION MICRODISCECTOMY 1 LEVEL;  Surgeon: Hosie Spangle, MD;  Location: [facility];  Service: Neurosurgery;  Laterality: Left;  LEFT L5S1 laminotomy foraminotomy and possible microdiskectomy  . MELANOMA EXCISION    . NASAL SINUS SURGERY    . POSTERIOR CERVICAL FUSION/FORAMINOTOMY Right 10/01/2012   Procedure: POSTERIOR CERVICAL FUSION/FORAMINOTOMY LEVEL 1;  Surgeon: Hosie Spangle, MD;  Location: [facility];  Service: Neurosurgery;  Laterality: Right;  Right Cervical seven thoracic one Cervical laminectomy/foraminotomy with posterior cervical arthrodesis   . POSTERIOR LUMBAR FUSION N/A 05/23/2013   Procedure: exploration of lumbar wound, explantation of left interbody implant.;  Surgeon: Hosie Spangle, MD;  Location: [facility];  Service: Neurosurgery;  Laterality: N/A;  . rectal fissure    . rectal fissure    . VASECTOMY    . VITRECTOMY     right and left 2013   Social History  Substance Use Topics  . Smoking status: Former Smoker    Packs/day: 1.00    Years: 33.00  Quit date: 09/29/1981  . Smokeless tobacco: Not on file  . Alcohol use 1.8 oz/week    3 Standard drinks or equivalent per week     Comment: daily   Allergies  Allergen Reactions  . Gabapentin     Lower extremity edema  . Lyrica [Pregabalin] Swelling   Family History  Problem Relation Age of Onset  . Dementia Mother   . Anemia Father   . Heart disease Father      Past medical history, social, surgical and family history all reviewed in electronic medical record.   Review of Systems: No headache, visual changes, nausea, vomiting, diarrhea,  constipation, dizziness, abdominal pain, skin rash, fevers, chills, night sweats, weight loss, swollen lymph nodes, body aches, joint swelling, muscle aches, chest pain, shortness of breath, mood changes.   Objective  There were no vitals taken for this visit.  General: No apparent distress alert and oriented x3 mood and affect normal, dressed appropriately. Appears significantly uncomfortable HEENT: Pupils equal, extraocular movements intact  Respiratory: Patient's speak in full sentences and does not appear short of breath  Cardiovascular: No lower extremity edema, non tender, no erythema  Skin: Warm dry intact with no signs of infection or rash on extremities or on axial skeleton.  Abdomen:Abdominal pain seems to be resolved. Still some mild discomfort anterior laterally. Not as severe as previous exam. Neuro: Cranial nerves II through XII are intact, neurovascularly intact in all extremities with 2+ DTRs and 2+ pulses.  Lymph: No lymphadenopathy of posterior or anterior cervical chain or axillae bilaterally.  Gait normal with good balance and coordination.  MSK:  Non tender with full range of motion and good stability and symmetric strength and tone of , elbows, wrist, hip, and ankles bilaterally. Some muscle wasting noted of the hands bilaterally B  Shoulder: bilateral Inspection reveals no abnormalities, atrophy or asymmetry. Minimal pain over the acromial clavicular joint Mild decreased range of motion in internal and external rotation bilaterally. 4 out of 5 but symmetric Mild signs of impingement. Severe tenderness to palpation over the trapezius on the left side and has 3 distinct trigger points Speeds and Yergason's tests normal. Positive crossover Severe pain with cross over.  Normal scapular function observed. No painful arc and no drop arm sign. No apprehension sign  Procedure: Real-time Ultrasound Guided Injection of right glenohumeral joint Device: GE Logiq E    Ultrasound guided injection is preferred based studies that show increased duration, increased effect, greater accuracy, decreased procedural pain, increased response rate with ultrasound guided versus blind injection.  Verbal informed consent obtained.  Time-out conducted.  Noted no overlying erythema, induration, or other signs of local infection.  Skin prepped in a sterile fashion.  Local anesthesia: Topical Ethyl chloride.  With sterile technique and under real time ultrasound guidance:  Joint visualized.  23g 1  inch needle inserted posterior approach. Pictures taken for needle placement. Patient did have injection of 2 cc of 1% lidocaine, 2 cc of 0.5% Marcaine, and 1.0 cc of Kenalog 40 mg/dL. Completed without difficulty  Pain immediately resolved suggesting accurate placement of the medication.  Advised to call if fevers/chills, erythema, induration, drainage, or persistent bleeding.  Images permanently stored and available for review in the ultrasound unit.  Impression: Technically successful ultrasound guided injection.     Impression and Recommendations:     This case required medical decision making of moderate complexity.

## 2016-05-01 ENCOUNTER — Ambulatory Visit: Payer: Medicare Other

## 2016-05-08 ENCOUNTER — Encounter: Payer: Self-pay | Admitting: Internal Medicine

## 2016-05-14 ENCOUNTER — Ambulatory Visit (INDEPENDENT_AMBULATORY_CARE_PROVIDER_SITE_OTHER): Payer: Medicare Other

## 2016-05-14 ENCOUNTER — Telehealth: Payer: Self-pay

## 2016-05-14 DIAGNOSIS — E538 Deficiency of other specified B group vitamins: Secondary | ICD-10-CM

## 2016-05-14 MED ORDER — CYANOCOBALAMIN 1000 MCG/ML IJ SOLN
1000.0000 ug | Freq: Once | INTRAMUSCULAR | Status: AC
  Administered 2016-05-14: 1000 ug via INTRAMUSCULAR

## 2016-05-14 NOTE — Telephone Encounter (Signed)
Pt states he was supposed to get an ortho referral in regards to shoulder pain. Please advise and call back.

## 2016-05-14 NOTE — Telephone Encounter (Signed)
Talked to referrals and this was [facility] to [facility] and they will send to ortho today.

## 2016-05-15 ENCOUNTER — Encounter: Payer: Self-pay | Admitting: Internal Medicine

## 2016-05-15 ENCOUNTER — Other Ambulatory Visit: Payer: Self-pay | Admitting: Internal Medicine

## 2016-05-15 ENCOUNTER — Ambulatory Visit (INDEPENDENT_AMBULATORY_CARE_PROVIDER_SITE_OTHER): Payer: Medicare Other | Admitting: Internal Medicine

## 2016-05-15 DIAGNOSIS — J40 Bronchitis, not specified as acute or chronic: Secondary | ICD-10-CM | POA: Diagnosis not present

## 2016-05-15 MED ORDER — DOXYCYCLINE HYCLATE 100 MG PO TABS: 100.0000 mg | ORAL_TABLET | Freq: Two times a day (BID) | ORAL | 0 refills | Status: DC

## 2016-05-15 NOTE — Telephone Encounter (Signed)
Patient aware.

## 2016-05-15 NOTE — Progress Notes (Signed)
Pre visit review using our clinic review tool, if applicable. No additional management support is needed unless otherwise documented below in the visit note. 

## 2016-05-15 NOTE — Patient Instructions (Signed)
We have sent in doxycycline for the lungs and the sinuses.   Take 1 pill twice a day for 1 week.   If you are not feeling better by Friday call us and we can send in the prednisone to have on hand.

## 2016-05-15 NOTE — Progress Notes (Signed)
   Subjective:    Patient ID: [name], male    DOB: 1931-10-12, 82 y.o.   MRN: [id number]  HPI The patient is an 82 YO man coming in for cough and respiratory symptoms for the last 2 weeks. Started with some chills and drainage. Next came sore throat and cough. He has had the cough since that time. Did 2 rounds of nose sprays and cold medicine otc. He is having some SOB and yellow sputum. No fevers or chills now. Some mild nose drainage but this is improving.   Review of Systems  Constitutional: Positive for activity change, chills and fatigue. Negative for appetite change, fever and unexpected weight change.  HENT: Positive for congestion, postnasal drip and rhinorrhea. Negative for dental problem, ear discharge, ear pain, sinus pressure, sore throat and trouble swallowing.   Eyes: Negative.   Respiratory: Positive for cough and shortness of breath. Negative for chest tightness and wheezing.   Cardiovascular: Negative for chest pain, palpitations and leg swelling.  Gastrointestinal: Negative for abdominal distention, abdominal pain, constipation, diarrhea and nausea.  Genitourinary: Negative for enuresis and frequency.  Musculoskeletal: Positive for arthralgias. Negative for gait problem and myalgias.      Objective:   Physical Exam  Constitutional: He is oriented to person, place, and time. He appears well-developed and well-nourished.  HENT:  Head: Normocephalic and atraumatic.  Oropharynx redness and some nasal crusting.   Neck: Normal range of motion.  Cardiovascular: Normal rate and regular rhythm.   Pulmonary/Chest: Effort normal. No respiratory distress. He has wheezes. He has no rales.  Some scattered rhonchi which partially clear with cough. Minimal expiratory wheezing bilateral bases  Abdominal: Soft. He exhibits no distension. There is no tenderness. There is no rebound.  Neurological: He is alert and oriented to person, place, and time. Coordination normal.  Skin:  Skin is warm and dry.  Psychiatric: He has a normal mood and affect.   Vitals:   05/15/16 1115  BP: 126/78  Pulse: 80  Resp: 18  Temp: 98.6 F (37 C)  TempSrc: Oral  SpO2: 97%  Weight: 213 lb (96.6 kg)  Height: 6' (1.829 m)      Assessment & Plan:

## 2016-05-15 NOTE — Assessment & Plan Note (Signed)
Rx for doxycycline 7 days for the infection in the lungs. Call back if not improved.

## 2016-05-17 DIAGNOSIS — M75111 Incomplete rotator cuff tear or rupture of right shoulder, not specified as traumatic: Secondary | ICD-10-CM | POA: Diagnosis not present

## 2016-05-17 DIAGNOSIS — M7582 Other shoulder lesions, left shoulder: Secondary | ICD-10-CM | POA: Diagnosis not present

## 2016-05-17 DIAGNOSIS — S139XXA Sprain of joints and ligaments of unspecified parts of neck, initial encounter: Secondary | ICD-10-CM | POA: Diagnosis not present

## 2016-05-25 DIAGNOSIS — M25511 Pain in right shoulder: Secondary | ICD-10-CM | POA: Diagnosis not present

## 2016-05-28 DIAGNOSIS — S46011A Strain of muscle(s) and tendon(s) of the rotator cuff of right shoulder, initial encounter: Secondary | ICD-10-CM | POA: Diagnosis not present

## 2016-05-28 DIAGNOSIS — M7582 Other shoulder lesions, left shoulder: Secondary | ICD-10-CM | POA: Diagnosis not present

## 2016-05-29 ENCOUNTER — Encounter: Payer: Self-pay | Admitting: Family Medicine

## 2016-06-01 ENCOUNTER — Ambulatory Visit (INDEPENDENT_AMBULATORY_CARE_PROVIDER_SITE_OTHER): Payer: Medicare Other | Admitting: Geriatric Medicine

## 2016-06-01 DIAGNOSIS — E538 Deficiency of other specified B group vitamins: Secondary | ICD-10-CM | POA: Diagnosis not present

## 2016-06-01 MED ORDER — CYANOCOBALAMIN 1000 MCG/ML IJ SOLN
1000.0000 ug | Freq: Once | INTRAMUSCULAR | Status: AC
  Administered 2016-06-01: 1000 ug via INTRAMUSCULAR

## 2016-06-03 ENCOUNTER — Other Ambulatory Visit: Payer: Self-pay | Admitting: Family Medicine

## 2016-06-06 DIAGNOSIS — R351 Nocturia: Secondary | ICD-10-CM | POA: Diagnosis not present

## 2016-06-06 DIAGNOSIS — N401 Enlarged prostate with lower urinary tract symptoms: Secondary | ICD-10-CM | POA: Diagnosis not present

## 2016-06-06 DIAGNOSIS — N480 Leukoplakia of penis: Secondary | ICD-10-CM | POA: Diagnosis not present

## 2016-06-08 ENCOUNTER — Other Ambulatory Visit: Payer: Self-pay | Admitting: Internal Medicine

## 2016-06-12 ENCOUNTER — Encounter: Payer: Self-pay | Admitting: Internal Medicine

## 2016-06-12 ENCOUNTER — Other Ambulatory Visit: Payer: Self-pay | Admitting: Internal Medicine

## 2016-06-14 DIAGNOSIS — M542 Cervicalgia: Secondary | ICD-10-CM | POA: Diagnosis not present

## 2016-06-14 DIAGNOSIS — M5030 Other cervical disc degeneration, unspecified cervical region: Secondary | ICD-10-CM | POA: Diagnosis not present

## 2016-06-14 DIAGNOSIS — R29898 Other symptoms and signs involving the musculoskeletal system: Secondary | ICD-10-CM | POA: Diagnosis not present

## 2016-06-14 DIAGNOSIS — I10 Essential (primary) hypertension: Secondary | ICD-10-CM | POA: Diagnosis not present

## 2016-06-14 DIAGNOSIS — Z981 Arthrodesis status: Secondary | ICD-10-CM | POA: Insufficient documentation

## 2016-06-14 DIAGNOSIS — M5412 Radiculopathy, cervical region: Secondary | ICD-10-CM | POA: Diagnosis not present

## 2016-06-14 DIAGNOSIS — Z6828 Body mass index (BMI) 28.0-28.9, adult: Secondary | ICD-10-CM | POA: Diagnosis not present

## 2016-06-14 DIAGNOSIS — M4722 Other spondylosis with radiculopathy, cervical region: Secondary | ICD-10-CM | POA: Diagnosis not present

## 2016-06-16 DIAGNOSIS — M4722 Other spondylosis with radiculopathy, cervical region: Secondary | ICD-10-CM | POA: Diagnosis not present

## 2016-06-16 DIAGNOSIS — M542 Cervicalgia: Secondary | ICD-10-CM | POA: Diagnosis not present

## 2016-06-18 DIAGNOSIS — R29898 Other symptoms and signs involving the musculoskeletal system: Secondary | ICD-10-CM | POA: Diagnosis not present

## 2016-06-18 DIAGNOSIS — Z6828 Body mass index (BMI) 28.0-28.9, adult: Secondary | ICD-10-CM | POA: Insufficient documentation

## 2016-06-19 ENCOUNTER — Other Ambulatory Visit: Payer: Self-pay | Admitting: Internal Medicine

## 2016-06-19 ENCOUNTER — Encounter: Payer: Self-pay | Admitting: Internal Medicine

## 2016-06-19 MED ORDER — VITAMIN B-12 1000 MCG PO TABS: 1000.0000 ug | ORAL_TABLET | Freq: Every day | ORAL | 3 refills | Status: DC

## 2016-06-25 ENCOUNTER — Other Ambulatory Visit: Payer: Self-pay | Admitting: Internal Medicine

## 2016-06-25 ENCOUNTER — Encounter: Payer: Self-pay | Admitting: Family Medicine

## 2016-06-27 ENCOUNTER — Encounter: Payer: Self-pay | Admitting: Geriatric Medicine

## 2016-06-27 NOTE — Progress Notes (Signed)
[name] [place] [place], [place] [address] Phone: [phone] Subjective:      CC: left knee pain   [id number]  [name] is a 82 y.o. male coming in with complaint of Left knee pain. Patient past medical history is significant for tricompartmental arthritis that has been seen on x-rays back in 2015. This was independently visualized by me. Patient is having increasing pain and instability. Patient has a right-sided total knee arthroscopically 2 as well as now a replacement. Patient states that the pain is severe enough that it is affecting daily activities. Patient knows he will likely have to have a replacement at some point but he is going out of town at this moment and like to be pain-free. No radiation of pain or any numbness. Not responding to over-the-counter medications at this time.     Past Medical History:  Diagnosis Date  . ACTINIC KERATOSIS, HEAD 03/23/2008  . ALLERGIC RHINITIS 03/27/2007  . Arthritis   . BENIGN PROSTATIC HYPERTROPHY, WITH URINARY OBSTRUCTION 10/16/2007  . CAD 08/18/2007   "patient denies any issues with heart, does not see cardiologist"  . CARPAL TUNNEL SYNDROME, LEFT 04/13/2008  . Chronic back pain   . Chronic neck pain   . DVT (deep venous thrombosis) ([facility])   . ERECTILE DYSFUNCTION, ORGANIC 02/20/2010  . Gout, unspecified 02/20/2010  . Headache(784.0)    migraines hx of  . History of melanoma   . HYPERLIPIDEMIA, WITH HIGH HDL 10/18/2008  . HYPERTENSION 05/09/2007   sees Dr. Benay Pillow  . KNEE PAIN 10/10/2009  . LOW BACK PAIN 03/27/2007  . NEOPLASM, MALIGNANT, SKIN, TRUNK 05/09/2007  . Patellar tendinitis 10/10/2009  . SKIN CANCER, HX OF 03/27/2007  . UNS ADVRS EFF UNS RX MEDICINAL&BIOLOGICAL SBSTNC 08/18/2007  . Urinary urgency    Past Surgical History:  Procedure Laterality Date  . APPENDECTOMY  1969  . BACK SURGERY    . BLEPHAROPLASTY    . CARPAL TUNNEL RELEASE    . CHOLECYSTECTOMY    .  ESOPHAGOGASTRODUODENOSCOPY (EGD) WITH PROPOFOL N/A 04/26/2015   Procedure: ESOPHAGOGASTRODUODENOSCOPY (EGD) WITH PROPOFOL;  Surgeon: Inda Castle, MD;  Location: [facility];  Service: Endoscopy;  Laterality: N/A;  . EYE SURGERY     bilateral cataracts removed and vitrectomies  . JOINT REPLACEMENT Right    knee  . joint replacement surgery    . KNEE ARTHROSCOPY    . LAMINECTOMY    . LATERAL FUSION LUMBAR SPINE, TRANSVERSE    . LUMBAR FUSION    . lumbar fusions    . LUMBAR LAMINECTOMY/DECOMPRESSION MICRODISCECTOMY Left 11/03/2012   Procedure: LUMBAR LAMINECTOMY/DECOMPRESSION MICRODISCECTOMY 1 LEVEL;  Surgeon: Hosie Spangle, MD;  Location: [facility];  Service: Neurosurgery;  Laterality: Left;  LEFT L5S1 laminotomy foraminotomy and possible microdiskectomy  . MELANOMA EXCISION    . NASAL SINUS SURGERY    . POSTERIOR CERVICAL FUSION/FORAMINOTOMY Right 10/01/2012   Procedure: POSTERIOR CERVICAL FUSION/FORAMINOTOMY LEVEL 1;  Surgeon: Hosie Spangle, MD;  Location: [facility];  Service: Neurosurgery;  Laterality: Right;  Right Cervical seven thoracic one Cervical laminectomy/foraminotomy with posterior cervical arthrodesis   . POSTERIOR LUMBAR FUSION N/A 05/23/2013   Procedure: exploration of lumbar wound, explantation of left interbody implant.;  Surgeon: Hosie Spangle, MD;  Location: [facility];  Service: Neurosurgery;  Laterality: N/A;  . rectal fissure    . rectal fissure    . VASECTOMY    . VITRECTOMY  right and left 2013   Social History   Social History  . Marital status: Married    Spouse name: N/A  . Number of children: N/A  . Years of education: N/A   Social History Main Topics  . Smoking status: Former Smoker    Packs/day: 1.00    Years: 33.00    Quit date: 09/29/1981  . Smokeless tobacco: Never Used  . Alcohol use 1.8 oz/week    3 Standard drinks or equivalent per week     Comment: daily  . Drug use: No  . Sexual activity: Yes   Other Topics Concern    . None   Social History Narrative  . None   Allergies  Allergen Reactions  . Gabapentin     Lower extremity edema  . Lyrica [Pregabalin] Swelling   Family History  Problem Relation Age of Onset  . Dementia Mother   . Anemia Father   . Heart disease Father     Past medical history, social, surgical and family history all reviewed in electronic medical record.  No pertanent information unless stated regarding to the chief complaint.   Review of Systems:Review of systems updated and as accurate as of 06/28/16  No headache, visual changes, nausea, vomiting, diarrhea, constipation, dizziness, abdominal pain, skin rash, fevers, chills, night sweats, weight loss, swollen lymph nodes, chest pain, shortness of breath, mood changes.   Objective  Blood pressure 130/78, pulse 91, height 6' (1.829 m), weight 214 lb (97.1 kg), SpO2 98 %. Systems examined below as of 06/28/16   General: No apparent distress alert and oriented x3 mood and affect normal, dressed appropriately.  HEENT: Pupils equal, extraocular movements intact  Respiratory: Patient's speak in full sentences and does not appear short of breath  Cardiovascular: No lower extremity edema, non tender, no erythema  Skin: Warm dry intact with no signs of infection or rash on extremities or on axial skeleton.  Abdomen: Soft nontender  Neuro: Cranial nerves II through XII are intact, neurovascularly intact in all extremities with 2+ DTRs and 2+ pulses.  Lymph: No lymphadenopathy of posterior or anterior cervical chain or axillae bilaterally.  GaitAntalgic gait.  MSK:  Non tender with full range of motion and good stability and symmetric strength and tone of shoulders, elbows, wrist, hip, and ankles bilaterally. Significant arthritic changes of multiple joints.  Knee: Left Valgus deformity noted Severe tenderness to palpation over the medial joint line ROM full in flexion and extension and lower leg rotation. Instability noted with  valgus force. Negative Mcmurray's, Apley's, and Thessalonian tests. painful patellar compression. Patellar glide moderate crepitus. Patellar and quadriceps tendons unremarkable. Hamstring and quadriceps strength is normal.   After informed written and verbal consent, patient was seated on exam table. Left knee was prepped with alcohol swab and utilizing anterolateral approach, patient's left knee space was injected with 4:1  marcaine 0.5%: Kenalog 40mg /dL. Patient tolerated the procedure well without immediate complications.   Impression and Recommendations:     This case required medical decision making of moderate complexity.      Note: This dictation was prepared with Dragon dictation along with smaller phrase technology. Any transcriptional errors that result from this process are unintentional.

## 2016-06-28 ENCOUNTER — Encounter: Payer: Self-pay | Admitting: Family Medicine

## 2016-06-28 ENCOUNTER — Ambulatory Visit (INDEPENDENT_AMBULATORY_CARE_PROVIDER_SITE_OTHER): Payer: Medicare Other | Admitting: Family Medicine

## 2016-06-28 DIAGNOSIS — M1712 Unilateral primary osteoarthritis, left knee: Secondary | ICD-10-CM

## 2016-06-28 NOTE — Assessment & Plan Note (Signed)
Patient's responding well to the injection. Encourage patient to conservative therapy including topical anti-inflammatories, icing protocol, home exercises. We discussed proper shoes. Patient will continue with conservative therapy. Patient will come back and see me again in 4 weeks. Patient could be a candidate for viscous supplementation.

## 2016-06-28 NOTE — Patient Instructions (Signed)
Good to see you  You have bad arthritis in the knee and we will watch it.  Have fun on your trip We have other injection if it does not work.

## 2016-06-29 ENCOUNTER — Ambulatory Visit: Payer: Medicare Other | Admitting: Family Medicine

## 2016-06-29 DIAGNOSIS — N401 Enlarged prostate with lower urinary tract symptoms: Secondary | ICD-10-CM | POA: Diagnosis not present

## 2016-06-29 DIAGNOSIS — N480 Leukoplakia of penis: Secondary | ICD-10-CM | POA: Diagnosis not present

## 2016-06-29 DIAGNOSIS — R351 Nocturia: Secondary | ICD-10-CM | POA: Diagnosis not present

## 2016-07-04 DIAGNOSIS — M25511 Pain in right shoulder: Secondary | ICD-10-CM | POA: Diagnosis not present

## 2016-07-12 ENCOUNTER — Ambulatory Visit (INDEPENDENT_AMBULATORY_CARE_PROVIDER_SITE_OTHER): Payer: Medicare Other | Admitting: Neurology

## 2016-07-12 ENCOUNTER — Encounter: Payer: Self-pay | Admitting: Neurology

## 2016-07-12 VITALS — BP 134/76 | HR 101 | Ht 72.0 in | Wt 219.5 lb

## 2016-07-12 DIAGNOSIS — R29898 Other symptoms and signs involving the musculoskeletal system: Secondary | ICD-10-CM

## 2016-07-12 NOTE — Patient Instructions (Signed)
   We will check EMG and NCV study to look at the nerves in the hands.

## 2016-07-12 NOTE — Progress Notes (Signed)
Reason for visit: Bilateral hand weakness  Referring physician: Dr. Consuello Bossier is a 82 y.o. male  History of present illness:  [name] is an 82 year old left-handed white male with a history of cervical spine surgery at the C7 and T1 level he believes was done in 2013. The patient has also had lumbosacral spine surgery at the L5-S1 level. The patient has noted some gradual changes in hand strength over the last one year. He has noted some numbness in the fourth and fifth fingers of the hands bilaterally. He believes that the right hand is somewhat weaker than the left. He denies any pain down from the neck into the arm or hand. The patient has been treated for a right rotator cuff tear by Dr. Alfonso Ramus, and he has some difficulty raising the right arm up because of this. The patient occasional have left-sided sciatica pain and he takes opiate medications on a regular basis because of this. He denies any changes in balance or any recent falls. He denies issues controlling the bowels or the bladder. The patient was seen by Dr. Sherwood Gambler, he was set up for MRI evaluation of the cervical spine which did not show evidence of spinal cord or nerve root compression that would explain his current symptoms. The patient is sent to this office for further evaluation.  Past Medical History:  Diagnosis Date  . ACTINIC KERATOSIS, HEAD 03/23/2008  . ALLERGIC RHINITIS 03/27/2007  . Arthritis   . BENIGN PROSTATIC HYPERTROPHY, WITH URINARY OBSTRUCTION 10/16/2007  . CAD 08/18/2007   "patient denies any issues with heart, does not see cardiologist"  . CARPAL TUNNEL SYNDROME, LEFT 04/13/2008  . Chronic back pain   . Chronic neck pain   . DVT (deep venous thrombosis) ([facility])   . ERECTILE DYSFUNCTION, ORGANIC 02/20/2010  . Gout, unspecified 02/20/2010  . Headache(784.0)    migraines hx of  . History of melanoma   . HYPERLIPIDEMIA, WITH HIGH HDL 10/18/2008  . HYPERTENSION 05/09/2007   sees Dr. Benay Pillow   . KNEE PAIN 10/10/2009  . LOW BACK PAIN 03/27/2007  . NEOPLASM, MALIGNANT, SKIN, TRUNK 05/09/2007  . Patellar tendinitis 10/10/2009  . SKIN CANCER, HX OF 03/27/2007  . UNS ADVRS EFF UNS RX MEDICINAL&BIOLOGICAL SBSTNC 08/18/2007  . Urinary urgency     Past Surgical History:  Procedure Laterality Date  . APPENDECTOMY  1969  . BACK SURGERY    . BLEPHAROPLASTY    . CARPAL TUNNEL RELEASE    . CHOLECYSTECTOMY    . ESOPHAGOGASTRODUODENOSCOPY (EGD) WITH PROPOFOL N/A 04/26/2015   Procedure: ESOPHAGOGASTRODUODENOSCOPY (EGD) WITH PROPOFOL;  Surgeon: Inda Castle, MD;  [facility];  Service: Endoscopy;  Laterality: N/A;  . EYE SURGERY     bilateral cataracts removed and vitrectomies  . JOINT REPLACEMENT Right    knee  . KNEE ARTHROSCOPY    . LAMINECTOMY    . LATERAL FUSION LUMBAR SPINE, TRANSVERSE    . LUMBAR LAMINECTOMY/DECOMPRESSION MICRODISCECTOMY Left 11/03/2012   Procedure: LUMBAR LAMINECTOMY/DECOMPRESSION MICRODISCECTOMY 1 LEVEL;  Surgeon: Hosie Spangle, MD;  Location: [facility];  Service: Neurosurgery;  Laterality: Left;  LEFT L5S1 laminotomy foraminotomy and possible microdiskectomy  . MELANOMA EXCISION    . NASAL SINUS SURGERY    . POSTERIOR CERVICAL FUSION/FORAMINOTOMY Right 10/01/2012   Procedure: POSTERIOR CERVICAL FUSION/FORAMINOTOMY LEVEL 1;  Surgeon: Hosie Spangle, MD;  Location: [facility];  Service: Neurosurgery;  Laterality: Right;  Right Cervical seven thoracic one Cervical laminectomy/foraminotomy  with posterior cervical arthrodesis   . POSTERIOR LUMBAR FUSION N/A 05/23/2013   Procedure: exploration of lumbar wound, explantation of left interbody implant.;  Surgeon: Hosie Spangle, MD;  Location: [facility];  Service: Neurosurgery;  Laterality: N/A;  . RECTAL SURGERY     Fissure  . VITRECTOMY     right and left 2013    Family History  Problem Relation Age of Onset  . Dementia Mother   . Anemia Father   . Heart disease Father     Social history:   reports that he quit smoking about 34 years ago. He has a 33.00 pack-year smoking history. He has never used smokeless tobacco. He reports that he drinks about 1.8 oz of alcohol per week . He reports that he does not use drugs.  Medications:  Prior to Admission medications   Medication Sig Start Date End Date Taking? Authorizing Provider  amLODipine (NORVASC) 5 MG tablet TAKE 1 TABLET (5 MG TOTAL) BY MOUTH DAILY. 02/16/16  Yes Hoyt Koch, MD  carvedilol (COREG) 25 MG tablet TAKE 1 TABLET (25 MG TOTAL) BY MOUTH 2 (TWO) TIMES DAILY WITH A MEAL. 11/23/15  Yes Hoyt Koch, MD  finasteride (PROSCAR) 5 MG tablet TAKE 1 TABLET BY MOUTH EVERY DAY 04/26/16  Yes Hoyt Koch, MD  losartan (COZAAR) 50 MG tablet TAKE 1 TABLET (50 MG TOTAL) BY MOUTH DAILY. 02/23/16  Yes Hoyt Koch, MD  magnesium hydroxide (MILK OF MAGNESIA) 800 MG/5ML suspension Take 15 mLs by mouth every other day.   Yes Historical Provider, MD  Multiple Vitamin (MULTIVITAMIN WITH MINERALS) TABS tablet Take 1 tablet by mouth daily.   Yes Historical Provider, MD  Oxycodone HCl 10 MG TABS Take 10 mg by mouth 3 (three) times daily as needed (pain).  07/14/14  Yes Historical Provider, MD  predniSONE (DELTASONE) 10 MG tablet TAKE 1 TABLET EVERY DAY WITH BREAKFAST 06/25/16  Yes Hoyt Koch, MD  tamsulosin (FLOMAX) 0.4 MG CAPS capsule TAKE 1 CAPSULE (0.4 MG TOTAL) BY MOUTH DAILY. 04/10/16  Yes Hoyt Koch, MD  triamcinolone cream (KENALOG) 0.5 % USE AS DIRECTED 06/12/16  Yes Hoyt Koch, MD  vitamin B-12 (CYANOCOBALAMIN) 1000 MCG tablet Take 1 tablet (1,000 mcg total) by mouth daily. 06/19/16  Yes Hoyt Koch, MD  fluticasone Asencion Islam) 50 MCG/ACT nasal spray  06/30/16   Historical Provider, MD      Allergies  Allergen Reactions  . Gabapentin     Lower extremity edema  . Lyrica [Pregabalin] Swelling    ROS:  Out of a complete 14 system review of symptoms, the patient complains  only of the following symptoms, and all other reviewed systems are negative.  Swelling in the legs Easy bruising Muscle cramps Memory loss Decreased energy  Blood pressure 134/76, pulse (!) 101, height 6' (1.829 m), weight 219 lb 8 oz (99.6 kg).  Physical Exam  General: The patient is alert and cooperative at the time of the examination.  Eyes: Pupils are equal, round, and reactive to light. Discs are flat bilaterally.  Neck: The neck is supple, no carotid bruits are noted.  Respiratory: The respiratory examination is clear.  Cardiovascular: The cardiovascular examination reveals a regular rate and rhythm, no obvious murmurs or rubs are noted.  Skin: Extremities are with 2+ edema below the knees bilaterally.  Neurologic Exam  Mental status: The patient is alert and oriented x 3 at the time of the examination. The patient has apparent normal recent  and remote memory, with an apparently normal attention span and concentration ability.  Cranial nerves: Facial symmetry is present. There is good sensation of the face to pinprick and soft touch bilaterally. The strength of the facial muscles and the muscles to head turning and shoulder shrug are normal bilaterally. Speech is well enunciated, no aphasia or dysarthria is noted. Extraocular movements are full. Visual fields are full. The tongue is midline, and the patient has symmetric elevation of the soft palate. No obvious hearing deficits are noted.  Motor: The motor testing reveals 5 over 5 strength of all 4 extremities, with exception of 4/5 strength with intrinsic muscles of the hands bilaterally, and with the APB muscle bilaterally. Some atrophy of the first dorsal interosseous muscles are seen bilaterally, right greater than left. The patient also has some difficulty with abduction of the right arm and external rotation of the right arm. Good symmetric motor tone is noted throughout.  Sensory: Sensory testing is intact to pinprick,  soft touch, vibration sensation, and position sense on all 4 extremities, with exception of some stocking pattern pinprick sensory change of to the knees bilaterally. No evidence of extinction is noted.  Coordination: Cerebellar testing reveals good finger-nose-finger and heel-to-shin bilaterally. Tinel's sign at the wrists is negative bilaterally.  Gait and station: Gait is normal. Tandem gait is unsteady. Romberg is negative. No drift is seen.  Reflexes: Deep tendon reflexes are symmetric, but are depressed bilaterally. Toes are downgoing bilaterally.   Assessment/Plan:  1. Bilateral hand weakness  The patient reports some weakness of the hands, he appears to have some difficulty with intrinsic muscle strength of the hands bilaterally. The distal flexors of the thumb and fingers are good strength. The patient reports some numbness in the hands in the ulnar nerve distribution. The patient will need to be evaluated for possible C8 nerve root issue or a distal ulnar neuropathy bilaterally. The patient will be set up for nerve conduction studies of both arms, and leg. EMG evaluation will be done on at least one arm, the right side. He will return for the above study.  Jill Alexanders MD 07/12/2016 9:20 AM  [facility] [address] [place], [place] [address]  Phone [phone] Fax [phone]

## 2016-07-17 ENCOUNTER — Other Ambulatory Visit: Payer: Self-pay | Admitting: Internal Medicine

## 2016-07-26 ENCOUNTER — Other Ambulatory Visit: Payer: Self-pay | Admitting: Internal Medicine

## 2016-07-27 DIAGNOSIS — M5030 Other cervical disc degeneration, unspecified cervical region: Secondary | ICD-10-CM | POA: Diagnosis not present

## 2016-07-27 DIAGNOSIS — M4722 Other spondylosis with radiculopathy, cervical region: Secondary | ICD-10-CM | POA: Diagnosis not present

## 2016-07-27 DIAGNOSIS — Z6829 Body mass index (BMI) 29.0-29.9, adult: Secondary | ICD-10-CM | POA: Diagnosis not present

## 2016-07-27 DIAGNOSIS — Z981 Arthrodesis status: Secondary | ICD-10-CM | POA: Diagnosis not present

## 2016-07-27 DIAGNOSIS — R29898 Other symptoms and signs involving the musculoskeletal system: Secondary | ICD-10-CM | POA: Diagnosis not present

## 2016-07-27 DIAGNOSIS — I10 Essential (primary) hypertension: Secondary | ICD-10-CM | POA: Diagnosis not present

## 2016-07-30 ENCOUNTER — Encounter: Payer: Self-pay | Admitting: Internal Medicine

## 2016-07-30 MED ORDER — FUROSEMIDE 20 MG PO TABS: 20.0000 mg | ORAL_TABLET | Freq: Every day | ORAL | 0 refills | Status: DC

## 2016-08-10 ENCOUNTER — Other Ambulatory Visit: Payer: Self-pay | Admitting: Internal Medicine

## 2016-08-12 ENCOUNTER — Other Ambulatory Visit: Payer: Self-pay | Admitting: Internal Medicine

## 2016-08-13 ENCOUNTER — Encounter: Payer: Self-pay | Admitting: Internal Medicine

## 2016-08-16 ENCOUNTER — Ambulatory Visit (INDEPENDENT_AMBULATORY_CARE_PROVIDER_SITE_OTHER): Payer: Medicare Other | Admitting: Internal Medicine

## 2016-08-16 ENCOUNTER — Encounter: Payer: Self-pay | Admitting: Internal Medicine

## 2016-08-16 ENCOUNTER — Other Ambulatory Visit (INDEPENDENT_AMBULATORY_CARE_PROVIDER_SITE_OTHER): Payer: Medicare Other

## 2016-08-16 VITALS — BP 120/60 | HR 87 | Temp 98.3°F | Resp 12 | Ht 72.0 in | Wt 218.2 lb

## 2016-08-16 DIAGNOSIS — M7989 Other specified soft tissue disorders: Secondary | ICD-10-CM

## 2016-08-16 DIAGNOSIS — L570 Actinic keratosis: Secondary | ICD-10-CM | POA: Diagnosis not present

## 2016-08-16 DIAGNOSIS — D1801 Hemangioma of skin and subcutaneous tissue: Secondary | ICD-10-CM | POA: Diagnosis not present

## 2016-08-16 DIAGNOSIS — L821 Other seborrheic keratosis: Secondary | ICD-10-CM | POA: Diagnosis not present

## 2016-08-16 LAB — COMPREHENSIVE METABOLIC PANEL
ALT: 23 U/L (ref 0–53)
AST: 18 U/L (ref 0–37)
Albumin: 3.6 g/dL (ref 3.5–5.2)
Alkaline Phosphatase: 58 U/L (ref 39–117)
BUN: 20 mg/dL (ref 6–23)
CO2: 33 mEq/L — ABNORMAL HIGH (ref 19–32)
Calcium: 9.2 mg/dL (ref 8.4–10.5)
Chloride: 103 mEq/L (ref 96–112)
Creatinine, Ser: 1.16 mg/dL (ref 0.40–1.50)
GFR: 63.91 mL/min (ref >60.00)
Glucose, Bld: 131 mg/dL — ABNORMAL HIGH (ref 70–99)
Potassium: 4.8 mEq/L (ref 3.5–5.1)
Sodium: 140 mEq/L (ref 135–145)
Total Bilirubin: 0.6 mg/dL (ref 0.2–1.2)
Total Protein: 6 g/dL (ref 6.0–8.3)

## 2016-08-16 LAB — BRAIN NATRIURETIC PEPTIDE: Pro B Natriuretic peptide (BNP): 115 pg/mL — ABNORMAL HIGH (ref 0.0–100.0)

## 2016-08-16 MED ORDER — TORSEMIDE 20 MG PO TABS: 20.0000 mg | ORAL_TABLET | Freq: Every day | ORAL | 0 refills | Status: DC

## 2016-08-16 NOTE — Progress Notes (Signed)
Pre visit review using our clinic review tool, if applicable. No additional management support is needed unless otherwise documented below in the visit note. 

## 2016-08-16 NOTE — Patient Instructions (Signed)
We have sent in the torsemide which is a stronger fluid pill. This should cause you to urinate more often and pull some of the fluid from the legs.   Make sure to keep the legs elevated when you can at home to help as well.

## 2016-08-16 NOTE — Progress Notes (Signed)
   Subjective:    Patient ID: [name], male    DOB: [birth year], 83 y.o.   MRN: [id number]  HPI The patient is an 83 YO man coming in for leg swelling. He has had it off and on for several months. Worse in the last month. He tried lasix which he sent in for him and this did not cause him to urinate more or help the fluid. He denies SOB or chest pains. No stomach problems. No other new concerns. Swelling in both legs, no pain. Worse throughout the day and better in the morning.   Review of Systems  Constitutional: Negative for activity change, appetite change, fatigue, fever and unexpected weight change.  Respiratory: Negative.   Cardiovascular: Positive for leg swelling. Negative for chest pain and palpitations.  Gastrointestinal: Negative.   Musculoskeletal: Positive for arthralgias and myalgias. Negative for back pain, gait problem, neck pain and neck stiffness.  Skin: Negative.       Objective:   Physical Exam  Constitutional: He is oriented to person, place, and time. He appears well-developed and well-nourished.  HENT:  Head: Normocephalic and atraumatic.  Eyes: EOM are normal.  Cardiovascular: Normal rate and regular rhythm.   Pulmonary/Chest: Effort normal and breath sounds normal.  Abdominal: Soft.  Musculoskeletal: He exhibits edema.  1+ edema bilaterally to shins, not pitting  Neurological: He is alert and oriented to person, place, and time.  Skin: Skin is warm and dry.   Vitals:   08/16/16 0801  BP: 120/60  Pulse: 87  Resp: 12  Temp: 98.3 F (36.8 C)  TempSrc: Oral  SpO2: 98%  Weight: 218 lb 4 oz (99 kg)  Height: 6' (1.829 m)      Assessment & Plan:

## 2016-08-16 NOTE — Assessment & Plan Note (Signed)
He has tried compression hose in the distant past and did not tolerate and does not want to try again. Lasix 20 mg did not produce diuresis. We discussed that amlodipine can worsen leg swelling and he does not wish to stop at this time given that he has been on it for a long time without problems. Rx for torsemide to see if this has better effect. Discussed elevation to help as well.

## 2016-08-20 ENCOUNTER — Other Ambulatory Visit: Payer: Self-pay | Admitting: Internal Medicine

## 2016-09-01 ENCOUNTER — Other Ambulatory Visit: Payer: Self-pay | Admitting: Internal Medicine

## 2016-09-03 ENCOUNTER — Other Ambulatory Visit: Payer: Self-pay | Admitting: Internal Medicine

## 2016-09-04 ENCOUNTER — Ambulatory Visit (INDEPENDENT_AMBULATORY_CARE_PROVIDER_SITE_OTHER): Payer: Self-pay | Admitting: Neurology

## 2016-09-04 ENCOUNTER — Encounter: Payer: Self-pay | Admitting: Neurology

## 2016-09-04 ENCOUNTER — Ambulatory Visit (INDEPENDENT_AMBULATORY_CARE_PROVIDER_SITE_OTHER): Payer: Medicare Other | Admitting: Neurology

## 2016-09-04 ENCOUNTER — Encounter: Payer: Self-pay | Admitting: Internal Medicine

## 2016-09-04 DIAGNOSIS — R29898 Other symptoms and signs involving the musculoskeletal system: Secondary | ICD-10-CM | POA: Diagnosis not present

## 2016-09-04 DIAGNOSIS — G5603 Carpal tunnel syndrome, bilateral upper limbs: Secondary | ICD-10-CM

## 2016-09-04 DIAGNOSIS — G5602 Carpal tunnel syndrome, left upper limb: Secondary | ICD-10-CM | POA: Insufficient documentation

## 2016-09-04 HISTORY — DX: Carpal tunnel syndrome, bilateral upper limbs: G56.03

## 2016-09-04 MED ORDER — BENZONATATE 200 MG PO CAPS: 200.0000 mg | ORAL_CAPSULE | Freq: Three times a day (TID) | ORAL | 0 refills | Status: DC | PRN

## 2016-09-04 NOTE — Procedures (Signed)
HISTORY:  [name] is an 83 year old gentleman with a history of prior cervical spine and lumbar spine surgery. The patient reports bilateral hand weakness and numbness, right greater than left. He also has a history of left-sided sciatica. The patient is being evaluated for these issues. He indicates a prior history of bilateral carpal tunnel syndrome surgery.  NERVE CONDUCTION STUDIES:  Nerve conduction studies were performed on both upper extremities. The distal motor latencies for the median nerves were prolonged bilaterally with a low motor amplitudes for the right median nerve, borderline normal on the left median nerve. The distal motor latencies and motor amplitudes for the ulnar nerves were normal. The F wave latencies were borderline normal on the left, prolonged on the right, and normal for the ulnar nerves bilaterally. The nerve conduction velocities for the median and ulnar nerves were normal bilaterally. The sensory latencies for the median nerves were prolonged bilaterally, normal for the ulnar nerves bilaterally.  Nerve conduction studies were performed on the left lower extremity. No response was seen for the left peroneal nerve, but the sensory latency for this nerve was normal. The distal motor latency and motor amplitude for the left posterior tibial nerve was normal. Nerve conduction velocity for this nerve was normal. The H reflex latency on the left was unobtainable.  EMG STUDIES:  EMG study was performed on the right upper extremity:  The first dorsal interosseous muscle reveals 2 to 4 K units with full recruitment. No fibrillations or positive waves were noted. Fast firing units were noted. The abductor pollicis brevis muscle reveals 2 to 4 K units with decreased recruitment. No fibrillations or positive waves were noted. Fast firing units were noted. The extensor indicis proprius muscle reveals 1 to 3 K units with full recruitment. No fibrillations or positive  waves were noted. Fast firing units were noted. The pronator teres muscle reveals 2 to 3 K units with full recruitment. No fibrillations or positive waves were noted. The biceps muscle reveals 1 to 2 K units with full recruitment. No fibrillations or positive waves were noted. The triceps muscle reveals 2 to 4 K units with full recruitment. No fibrillations or positive waves were noted. The anterior deltoid muscle reveals 2 to 3 K units with full recruitment. No fibrillations or positive waves were noted. The cervical paraspinal muscles were tested at 2 levels. No abnormalities of insertional activity were seen at either level tested. There was good relaxation.  EMG study was performed on the left lower extremity:  The tibialis anterior muscle reveals 2 to 4K motor units with full recruitment. No fibrillations or positive waves were seen. The peroneus tertius muscle reveals 2 to 4K motor units with slightly decreased recruitment. 1+ positive waves were seen. The medial gastrocnemius muscle reveals 1 to 4K motor units with slightly decreased recruitment. 1+ positive waves were seen. The vastus lateralis muscle reveals 2 to 4K motor units with full recruitment. No fibrillations or positive waves were seen. The iliopsoas muscle reveals 2 to 4K motor units with full recruitment. No fibrillations or positive waves were seen. The biceps femoris muscle (long head) reveals 2 to 4K motor units with full recruitment. No fibrillations or positive waves were seen. The lumbosacral paraspinal muscles were tested at 3 levels, and revealed 1+ positive waves at the upper and lower levels tested. There was good relaxation.   IMPRESSION:  Nerve conduction studies done on both upper extremities and on the left lower extremity shows evidence of bilateral carpal  tunnel syndrome of moderate severity on the right, mild severity on the left. NCV evaluation of the left lower extremity shows no motor response for the left  peroneal nerve with sensory sparing. EMG evaluation of the left lower extremity shows evidence of a mild chronic and acute S1 radiculopathy. There is no evidence of an overlying L5 radiculopathy. Motor findings by nerve conduction study on the left peroneal nerve likely represent a distal dysfunction of this nerve. EMG evaluation of the right upper extremity shows findings consistent with carpal tunnel syndrome and evidence of a mild overlying chronic C8 radiculopathy. No acute findings were noted in the right upper extremity.  Jill Alexanders MD 09/04/2016 4:50 PM  [facility] [address] [place], [place] [address]  Phone [phone] Fax [phone]

## 2016-09-04 NOTE — Progress Notes (Signed)
Please refer to EMG and nerve conduction study procedure note. 

## 2016-09-05 ENCOUNTER — Encounter: Payer: Self-pay | Admitting: Internal Medicine

## 2016-09-07 ENCOUNTER — Other Ambulatory Visit: Payer: Self-pay | Admitting: Internal Medicine

## 2016-09-08 ENCOUNTER — Encounter: Payer: Self-pay | Admitting: Internal Medicine

## 2016-09-10 ENCOUNTER — Encounter: Payer: Self-pay | Admitting: Internal Medicine

## 2016-09-11 ENCOUNTER — Ambulatory Visit (INDEPENDENT_AMBULATORY_CARE_PROVIDER_SITE_OTHER): Payer: Medicare Other | Admitting: Internal Medicine

## 2016-09-11 ENCOUNTER — Encounter: Payer: Self-pay | Admitting: Internal Medicine

## 2016-09-11 DIAGNOSIS — R05 Cough: Secondary | ICD-10-CM

## 2016-09-11 DIAGNOSIS — R059 Cough, unspecified: Secondary | ICD-10-CM

## 2016-09-11 MED ORDER — HYDROCODONE-HOMATROPINE 5-1.5 MG/5ML PO SYRP: 5.0000 mL | ORAL_SOLUTION | Freq: Three times a day (TID) | ORAL | 0 refills | Status: DC | PRN

## 2016-09-11 MED ORDER — DOXYCYCLINE HYCLATE 100 MG PO TABS: 100.0000 mg | ORAL_TABLET | Freq: Two times a day (BID) | ORAL | 0 refills | Status: DC

## 2016-09-11 NOTE — Assessment & Plan Note (Signed)
Wheezing and rhonchi on exam. Increase prednisone to 30 mg 3 days then taper. Also rx for doxycycline to cover infection. Rx for hycodan cough syrup so he can sleep better.

## 2016-09-11 NOTE — Progress Notes (Signed)
   Subjective:    Patient ID: [name], male    DOB: [birth year], 83 y.o.   MRN: [id number]  HPI The patient is an 83 YO man coming in for 1-2 weeks of SOB and cough. He is getting sore from coughing in his chest muscles. He is not having much nasal drainage or sinus pressure. He is having some wheezing especially at night time. Some chills at night time. Taking flonase and tried tessalon perles which did not help.   Review of Systems  Constitutional: Positive for activity change and chills. Negative for appetite change, fatigue, fever and unexpected weight change.  HENT: Positive for congestion. Negative for ear discharge, ear pain, postnasal drip, rhinorrhea, sinus pain, sinus pressure, sore throat and trouble swallowing.   Eyes: Negative.   Respiratory: Positive for cough, shortness of breath and wheezing. Negative for chest tightness.   Cardiovascular: Negative.   Gastrointestinal: Negative.   Musculoskeletal: Negative.   Skin: Negative.       Objective:   Physical Exam  Constitutional: He is oriented to person, place, and time. He appears well-developed and well-nourished.  HENT:  Head: Normocephalic and atraumatic.  Right Ear: External ear normal.  Left Ear: External ear normal.  Mouth/Throat: Oropharynx is clear and moist.  Eyes: EOM are normal.  Neck: Normal range of motion.  Cardiovascular: Normal rate and regular rhythm.   Pulmonary/Chest: Effort normal. No respiratory distress. He has wheezes. He has no rales.  Some wheezing and rhonchi bilaterally.   Abdominal: Soft.  Neurological: He is alert and oriented to person, place, and time.  Skin: Skin is warm and dry.   Vitals:   09/11/16 1103  BP: 130/70  Pulse: 87  Resp: 12  Temp: 98.3 F (36.8 C)  TempSrc: Oral  SpO2: 98%  Weight: 208 lb (94.3 kg)  Height: 6' (1.829 m)      Assessment & Plan:

## 2016-09-11 NOTE — Progress Notes (Signed)
Pre visit review using our clinic review tool, if applicable. No additional management support is needed unless otherwise documented below in the visit note. 

## 2016-09-11 NOTE — Patient Instructions (Signed)
For the prednisone take 3 pills for 3 days, then 2 pills for 3 days, then stop.   We have sent in doxycycline for an antibiotic to take 1 pill twice a day for 1 week.   We have given you the cough syrup which you can use for cough.

## 2016-09-26 ENCOUNTER — Other Ambulatory Visit: Payer: Self-pay | Admitting: Internal Medicine

## 2016-09-27 ENCOUNTER — Encounter: Payer: Self-pay | Admitting: Internal Medicine

## 2016-09-27 ENCOUNTER — Other Ambulatory Visit: Payer: Self-pay | Admitting: Internal Medicine

## 2016-09-28 DIAGNOSIS — G5603 Carpal tunnel syndrome, bilateral upper limbs: Secondary | ICD-10-CM | POA: Diagnosis not present

## 2016-09-28 DIAGNOSIS — R29898 Other symptoms and signs involving the musculoskeletal system: Secondary | ICD-10-CM | POA: Diagnosis not present

## 2016-09-28 DIAGNOSIS — M4726 Other spondylosis with radiculopathy, lumbar region: Secondary | ICD-10-CM | POA: Diagnosis not present

## 2016-09-28 DIAGNOSIS — M5416 Radiculopathy, lumbar region: Secondary | ICD-10-CM | POA: Diagnosis not present

## 2016-09-28 DIAGNOSIS — M5440 Lumbago with sciatica, unspecified side: Secondary | ICD-10-CM | POA: Diagnosis not present

## 2016-09-28 DIAGNOSIS — Z6828 Body mass index (BMI) 28.0-28.9, adult: Secondary | ICD-10-CM | POA: Diagnosis not present

## 2016-09-28 DIAGNOSIS — M5136 Other intervertebral disc degeneration, lumbar region: Secondary | ICD-10-CM | POA: Diagnosis not present

## 2016-09-28 MED ORDER — PREDNISONE 10 MG PO TABS: 10.0000 mg | ORAL_TABLET | Freq: Every day | ORAL | 0 refills | Status: DC

## 2016-10-05 ENCOUNTER — Encounter: Payer: Self-pay | Admitting: Family Medicine

## 2016-10-08 ENCOUNTER — Encounter: Payer: Self-pay | Admitting: Neurology

## 2016-10-08 ENCOUNTER — Ambulatory Visit (INDEPENDENT_AMBULATORY_CARE_PROVIDER_SITE_OTHER): Payer: Medicare Other | Admitting: Neurology

## 2016-10-08 VITALS — BP 150/92 | HR 86 | Ht 72.0 in | Wt 212.0 lb

## 2016-10-08 DIAGNOSIS — G5603 Carpal tunnel syndrome, bilateral upper limbs: Secondary | ICD-10-CM

## 2016-10-08 NOTE — Progress Notes (Signed)
Reason for visit: Carpal tunnel syndrome  [name] is an 83 y.o. male  History of present illness:  [name] is an 83 year old left-handed white male with a history of cervical and lumbar spine surgery previously. The patient has had EMG and nerve conduction study that shows evidence of a chronic stable C8 radiculopathy on the right, with a moderate carpal tunnel syndrome on the right, and a mild carpal tunnel syndrome on the left. The patient has evidence of a mild acute and chronic left S1 radiculopathy. The patient does have chronic back pain, and left-sided sciatica. The patient is having ongoing symptoms with numbness and discomfort involving the right hand, the patient has mild symptoms on the left. He feels that both hands are slightly weak. He is able to sleep fairly well, he has had prior right carpal tunnel syndrome surgery. He is followed by Dr. Sherwood Gambler. He returns for an evaluation.  Past Medical History:  Diagnosis Date  . ACTINIC KERATOSIS, HEAD 03/23/2008  . ALLERGIC RHINITIS 03/27/2007  . Arthritis   . BENIGN PROSTATIC HYPERTROPHY, WITH URINARY OBSTRUCTION 10/16/2007  . CAD 08/18/2007   "patient denies any issues with heart, does not see cardiologist"  . Carpal tunnel syndrome, bilateral 09/04/2016  . CARPAL TUNNEL SYNDROME, LEFT 04/13/2008  . Chronic back pain   . Chronic neck pain   . DVT (deep venous thrombosis) ([place])   . ERECTILE DYSFUNCTION, ORGANIC 02/20/2010  . Gout, unspecified 02/20/2010  . Headache(784.0)    migraines hx of  . History of melanoma   . HYPERLIPIDEMIA, WITH HIGH HDL 10/18/2008  . HYPERTENSION 05/09/2007   sees Dr. Benay Pillow  . KNEE PAIN 10/10/2009  . LOW BACK PAIN 03/27/2007  . NEOPLASM, MALIGNANT, SKIN, TRUNK 05/09/2007  . Patellar tendinitis 10/10/2009  . SKIN CANCER, HX OF 03/27/2007  . UNS ADVRS EFF UNS RX MEDICINAL&BIOLOGICAL SBSTNC 08/18/2007  . Urinary urgency     Past Surgical History:  Procedure Laterality Date  . APPENDECTOMY   1969  . BACK SURGERY    . BLEPHAROPLASTY    . CARPAL TUNNEL RELEASE    . CHOLECYSTECTOMY    . ESOPHAGOGASTRODUODENOSCOPY (EGD) WITH PROPOFOL N/A 04/26/2015   Procedure: ESOPHAGOGASTRODUODENOSCOPY (EGD) WITH PROPOFOL;  Surgeon: Inda Castle, MD;  Location: [facility];  Service: Endoscopy;  Laterality: N/A;  . EYE SURGERY     bilateral cataracts removed and vitrectomies  . JOINT REPLACEMENT Right    knee  . KNEE ARTHROSCOPY    . LAMINECTOMY    . LATERAL FUSION LUMBAR SPINE, TRANSVERSE    . LUMBAR LAMINECTOMY/DECOMPRESSION MICRODISCECTOMY Left 11/03/2012   Procedure: LUMBAR LAMINECTOMY/DECOMPRESSION MICRODISCECTOMY 1 LEVEL;  Surgeon: Hosie Spangle, MD;  Location: [facility];  Service: Neurosurgery;  Laterality: Left;  LEFT L5S1 laminotomy foraminotomy and possible microdiskectomy  . MELANOMA EXCISION    . NASAL SINUS SURGERY    . POSTERIOR CERVICAL FUSION/FORAMINOTOMY Right 10/01/2012   Procedure: POSTERIOR CERVICAL FUSION/FORAMINOTOMY LEVEL 1;  Surgeon: Hosie Spangle, MD;  Location: [facility];  Service: Neurosurgery;  Laterality: Right;  Right Cervical seven thoracic one Cervical laminectomy/foraminotomy with posterior cervical arthrodesis   . POSTERIOR LUMBAR FUSION N/A 05/23/2013   Procedure: exploration of lumbar wound, explantation of left interbody implant.;  Surgeon: Hosie Spangle, MD;  Location: [facility];  Service: Neurosurgery;  Laterality: N/A;  . RECTAL SURGERY     Fissure  . VITRECTOMY     right and left 2013    Family History  Problem Relation Age of Onset  . Dementia Mother   . Anemia Father   . Heart disease Father   . Stroke Sister     ICH  . Lung disease Sister     Social history:  reports that he quit smoking about 35 years ago. He has a 33.00 pack-year smoking history. He has never used smokeless tobacco. He reports that he drinks about 1.8 oz of alcohol per week . He reports that he does not use drugs.    Allergies  Allergen Reactions  .  Gabapentin     Lower extremity edema  . Lyrica [Pregabalin] Swelling    Medications:  Prior to Admission medications   Medication Sig Start Date End Date Taking? Authorizing Provider  amLODipine (NORVASC) 5 MG tablet TAKE 1 TABLET (5 MG TOTAL) BY MOUTH DAILY. 08/20/16  Yes Hoyt Koch, MD  carvedilol (COREG) 25 MG tablet TAKE 1 TABLET (25 MG TOTAL) BY MOUTH 2 (TWO) TIMES DAILY WITH A MEAL. 11/23/15  Yes Hoyt Koch, MD  finasteride (PROSCAR) 5 MG tablet TAKE 1 TABLET BY MOUTH EVERY DAY 08/14/16  Yes Hoyt Koch, MD  fluticasone [facility]) 50 MCG/ACT nasal spray PLACE 2 SPRAYS IN TO BOTH NOSTRILS DAILY 07/27/16  Yes Hoyt Koch, MD  losartan (COZAAR) 50 MG tablet TAKE 1 TABLET (50 MG TOTAL) BY MOUTH DAILY. 09/03/16  Yes Hoyt Koch, MD  magnesium hydroxide (MILK OF MAGNESIA) 800 MG/5ML suspension Take 15 mLs by mouth every other day.   Yes Historical Provider, MD  Oxycodone HCl 10 MG TABS Take 10 mg by mouth 3 (three) times daily as needed (pain).  07/14/14  Yes Historical Provider, MD  predniSONE (DELTASONE) 10 MG tablet Take 1 tablet (10 mg total) by mouth daily with breakfast. 09/28/16  Yes Hoyt Koch, MD  tamsulosin (FLOMAX) 0.4 MG CAPS capsule TAKE 1 CAPSULE (0.4 MG TOTAL) BY MOUTH DAILY. 04/10/16  Yes Hoyt Koch, MD  triamcinolone cream (KENALOG) 0.5 % USE AS DIRECTED 09/03/16  Yes Hoyt Koch, MD  vitamin B-12 (CYANOCOBALAMIN) 1000 MCG tablet Take 1 tablet (1,000 mcg total) by mouth daily. 06/19/16  Yes Hoyt Koch, MD    ROS:  Out of a complete 14 system review of symptoms, the patient complains only of the following symptoms, and all other reviewed systems are negative.  Back pain, leg discomfort Hand weakness  Blood pressure (!) 150/92, pulse 86, height 6' (1.829 m), weight 212 lb (96.2 kg).  Physical Exam  General: The patient is alert and cooperative at the time of the examination.  Skin: No  significant peripheral edema is noted.   Neurologic Exam  Mental status: The patient is alert and oriented x 3 at the time of the examination. The patient has apparent normal recent and remote memory, with an apparently normal attention span and concentration ability.   Cranial nerves: Facial symmetry is present. Speech is normal, no aphasia or dysarthria is noted. Extraocular movements are full. Visual fields are full.  Motor: The patient has good strength in all 4 extremities, with exception of slight weakness of intrinsic muscles of the hands bilaterally and with the APB muscles bilaterally.  Sensory examination: Soft touch sensation is symmetric on the face, arms, and legs.  Coordination: The patient has good finger-nose-finger and heel-to-shin bilaterally.  Gait and station: The patient has a normal gait. Tandem gait is normal. Romberg is negative. No drift is seen. The patient is able to walk on heels  and the toes bilaterally.  Reflexes: Deep tendon reflexes are symmetric.   Assessment/Plan:  1. Bilateral carpal tunnel syndrome  2. Mild left S1 radiculopathy  The patient has been followed by Dr. Sherwood Gambler, he will see him in April. The patient should be using wrist splints on both sides until then. The patient will follow-up through this office on an as-needed basis. The back pain and left leg pain are a chronic issue, the patient takes pain medications regularly for this.   Jill Alexanders MD 10/08/2016 12:38 PM  [facility] [address] [place], [place] [address]  Phone [phone] Fax [phone]

## 2016-10-08 NOTE — Patient Instructions (Signed)
   May try a wrist splint for the carpal tunnel syndrome.

## 2016-10-16 ENCOUNTER — Ambulatory Visit (INDEPENDENT_AMBULATORY_CARE_PROVIDER_SITE_OTHER): Payer: Medicare Other | Admitting: Family Medicine

## 2016-10-16 ENCOUNTER — Encounter: Payer: Self-pay | Admitting: Family Medicine

## 2016-10-16 DIAGNOSIS — M1712 Unilateral primary osteoarthritis, left knee: Secondary | ICD-10-CM | POA: Diagnosis not present

## 2016-10-16 NOTE — Patient Instructions (Signed)
Great to see you as always.  Stay active.  We injected the knee again.  Can repeat every 10 weeks or if worsening can do the other injections.  You know where I am if you need me.

## 2016-10-16 NOTE — Progress Notes (Signed)
[name] [place], [place] [address] Phone: [phone] Subjective:      CC: left knee pain f/u  SHF:WYOVZCHYIF  [name] is a 83 y.o. male coming in with complaint of Left knee pain. Patient past medical history is significant for tricompartmental arthritis that has been seen on x-rays back in 2015. This was independently visualized by me. Patient is having increasing pain and instability. Patient has a right-sided total knee arthroscopically 2 as well as now a replacement.  Patient was last seen 3 months ago and given injection of the left knee. Patient was doing very well until a recent weeks. Started having increasing pain and instability. States that it's worse than ever.     Past Medical History:  Diagnosis Date  . ACTINIC KERATOSIS, HEAD 03/23/2008  . ALLERGIC RHINITIS 03/27/2007  . Arthritis   . BENIGN PROSTATIC HYPERTROPHY, WITH URINARY OBSTRUCTION 10/16/2007  . CAD 08/18/2007   "patient denies any issues with heart, does not see cardiologist"  . Carpal tunnel syndrome, bilateral 09/04/2016  . CARPAL TUNNEL SYNDROME, LEFT 04/13/2008  . Chronic back pain   . Chronic neck pain   . DVT (deep venous thrombosis) ([place])   . ERECTILE DYSFUNCTION, ORGANIC 02/20/2010  . Gout, unspecified 02/20/2010  . Headache(784.0)    migraines hx of  . History of melanoma   . HYPERLIPIDEMIA, WITH HIGH HDL 10/18/2008  . HYPERTENSION 05/09/2007   sees Dr. Benay Pillow  . KNEE PAIN 10/10/2009  . LOW BACK PAIN 03/27/2007  . NEOPLASM, MALIGNANT, SKIN, TRUNK 05/09/2007  . Patellar tendinitis 10/10/2009  . SKIN CANCER, HX OF 03/27/2007  . UNS ADVRS EFF UNS RX MEDICINAL&BIOLOGICAL SBSTNC 08/18/2007  . Urinary urgency    Past Surgical History:  Procedure Laterality Date  . APPENDECTOMY  1969  . BACK SURGERY    . BLEPHAROPLASTY    . CARPAL TUNNEL RELEASE    . CHOLECYSTECTOMY    . ESOPHAGOGASTRODUODENOSCOPY (EGD) WITH PROPOFOL N/A 04/26/2015   Procedure:  ESOPHAGOGASTRODUODENOSCOPY (EGD) WITH PROPOFOL;  Surgeon: Inda Castle, MD;  [facility]: [facility];  Service: Endoscopy;  Laterality: N/A;  . EYE SURGERY     bilateral cataracts removed and vitrectomies  . JOINT REPLACEMENT Right    knee  . KNEE ARTHROSCOPY    . LAMINECTOMY    . LATERAL FUSION LUMBAR SPINE, TRANSVERSE    . LUMBAR LAMINECTOMY/DECOMPRESSION MICRODISCECTOMY Left 11/03/2012   Procedure: LUMBAR LAMINECTOMY/DECOMPRESSION MICRODISCECTOMY 1 LEVEL;  Surgeon: Hosie Spangle, MD;  Location: [facility];  Service: Neurosurgery;  Laterality: Left;  LEFT L5S1 laminotomy foraminotomy and possible microdiskectomy  . MELANOMA EXCISION    . NASAL SINUS SURGERY    . POSTERIOR CERVICAL FUSION/FORAMINOTOMY Right 10/01/2012   Procedure: POSTERIOR CERVICAL FUSION/FORAMINOTOMY LEVEL 1;  Surgeon: Hosie Spangle, MD;  Location: [facility];  Service: Neurosurgery;  Laterality: Right;  Right Cervical seven thoracic one Cervical laminectomy/foraminotomy with posterior cervical arthrodesis   . POSTERIOR LUMBAR FUSION N/A 05/23/2013   Procedure: exploration of lumbar wound, explantation of left interbody implant.;  Surgeon: Hosie Spangle, MD;  Location: [facility];  Service: Neurosurgery;  Laterality: N/A;  . RECTAL SURGERY     Fissure  . VITRECTOMY     right and left 2013   Social History   Social History  . Marital status: Married    Spouse name: N/A  . Number of children: 3  . Years of education: 12   Occupational History  .  Retired    Social History Main Topics  . Smoking status: Former Smoker    Packs/day: 1.00    Years: 33.00    Quit date: 09/29/1981  . Smokeless tobacco: Never Used  . Alcohol use 1.8 oz/week    3 Standard drinks or equivalent per week     Comment: Social  . Drug use: No  . Sexual activity: Yes    Partners: Female     Comment: Married   Other Topics Concern  . Not on file   Social History Narrative   Lives at home w/ his wife   Left-handed    Caffeine: 1 cup coffee daily   Allergies  Allergen Reactions  . Gabapentin     Lower extremity edema  . Lyrica [Pregabalin] Swelling   Family History  Problem Relation Age of Onset  . Dementia Mother   . Anemia Father   . Heart disease Father   . Stroke Sister     ICH  . Lung disease Sister     Past medical history, social, surgical and family history all reviewed in electronic medical record.  No pertanent information unless stated regarding to the chief complaint.   Review of Systems: No headache, visual changes, nausea, vomiting, diarrhea, constipation, dizziness, abdominal pain, skin rash, fevers, chills, night sweats, weight loss, swollen lymph nodess, chest pain, shortness of breath, mood changes.  Worsening muscle aches, joint swelling and body aches.  Objective  There were no vitals taken for this visit. Systems examined below as of 10/16/16   Systems examined below as of 10/16/16 General: NAD A&O x3 mood, affect normal  HEENT: Pupils equal, extraocular movements intact no nystagmus Respiratory: not short of breath at rest or with speaking Cardiovascular: No lower extremity edema, non tender Skin: Warm dry intact with no signs of infection or rash on extremities or on axial skeleton. Abdomen: Soft nontender, no masses Neuro: Cranial nerves  intact, neurovascularly intact in all extremities with 2+ DTRs and 2+ pulses. Lymph: No lymphadenopathy appreciated today  GaitAntalgic gait.  MSK:  Non tender with full range of motion and good stability and symmetric strength and tone of shoulders, elbows, wrist, hip, and ankles bilaterally. Significant arthritic changes of multiple joints.  Knee: Left Valgus deformity noted Severe tenderness to palpation over the medial joint line ROM full in flexion and extension and lower leg rotation. Instability noted with valgus force. Negative Mcmurray's, Apley's, and Thessalonian tests. painful patellar compression. Patellar glide  moderate crepitus. Patellar and quadriceps tendons unremarkable. Hamstring and quadriceps strength is normal.   After informed written and verbal consent, patient was seated on exam table. Left knee was prepped with alcohol swab and utilizing anterolateral approach, patient's left knee space was injected with 4:1  marcaine 0.5%: Kenalog 40mg /dL. Patient tolerated the procedure well without immediate complications.   Impression and Recommendations:     This case required medical decision making of moderate complexity.      Note: This dictation was prepared with Dragon dictation along with smaller phrase technology. Any transcriptional errors that result from this process are unintentional.

## 2016-10-16 NOTE — Assessment & Plan Note (Signed)
Worsening symptoms. Patient given injection today and tolerated the procedure well. We discussed icing regimen and home exercises. We discussed which activities to do a which ones to avoid. Encourage patient to increase activity as tolerated. Patient will be candidate for viscous supplementation. Follow-up again in 3-4 weeks. Topical anti-inflammatories given.

## 2016-10-26 ENCOUNTER — Other Ambulatory Visit: Payer: Self-pay | Admitting: Internal Medicine

## 2016-11-08 ENCOUNTER — Other Ambulatory Visit: Payer: Self-pay | Admitting: Internal Medicine

## 2016-11-08 ENCOUNTER — Telehealth: Payer: Self-pay | Admitting: Internal Medicine

## 2016-11-08 NOTE — Telephone Encounter (Signed)
Called patient to schedule awv. Patient did not answer. Will attempt to call patient again.

## 2016-11-09 ENCOUNTER — Encounter: Payer: Self-pay | Admitting: Family Medicine

## 2016-11-12 NOTE — Progress Notes (Deleted)
[name] [place], [place] [address] Phone: [phone] Subjective:      CC: left knee pain f/u  SHF:WYOVZCHYIF  [name] is a 83 y.o. male coming in with complaint of Left knee pain. Patient past medical history is significant for tricompartmental arthritis that has been seen on x-rays back in 2015. This was independently visualized by me. Patient is having increasing pain and instability. Patient has a right-sided total knee arthroscopically 2 as well as now a replacement.  Patient was last seen 3 months ago and given injection of the left knee. Patient was doing very well until a recent weeks. Started having increasing pain and instability. States that it's worse than ever.     Past Medical History:  Diagnosis Date  . ACTINIC KERATOSIS, HEAD 03/23/2008  . ALLERGIC RHINITIS 03/27/2007  . Arthritis   . BENIGN PROSTATIC HYPERTROPHY, WITH URINARY OBSTRUCTION 10/16/2007  . CAD 08/18/2007   "patient denies any issues with heart, does not see cardiologist"  . Carpal tunnel syndrome, bilateral 09/04/2016  . CARPAL TUNNEL SYNDROME, LEFT 04/13/2008  . Chronic back pain   . Chronic neck pain   . DVT (deep venous thrombosis) ([place])   . ERECTILE DYSFUNCTION, ORGANIC 02/20/2010  . Gout, unspecified 02/20/2010  . Headache(784.0)    migraines hx of  . History of melanoma   . HYPERLIPIDEMIA, WITH HIGH HDL 10/18/2008  . HYPERTENSION 05/09/2007   sees Dr. Benay Pillow  . KNEE PAIN 10/10/2009  . LOW BACK PAIN 03/27/2007  . NEOPLASM, MALIGNANT, SKIN, TRUNK 05/09/2007  . Patellar tendinitis 10/10/2009  . SKIN CANCER, HX OF 03/27/2007  . UNS ADVRS EFF UNS RX MEDICINAL&BIOLOGICAL SBSTNC 08/18/2007  . Urinary urgency    Past Surgical History:  Procedure Laterality Date  . APPENDECTOMY  1969  . BACK SURGERY    . BLEPHAROPLASTY    . CARPAL TUNNEL RELEASE    . CHOLECYSTECTOMY    . ESOPHAGOGASTRODUODENOSCOPY (EGD) WITH PROPOFOL N/A 04/26/2015   Procedure:  ESOPHAGOGASTRODUODENOSCOPY (EGD) WITH PROPOFOL;  Surgeon: Inda Castle, MD;  [facility]: [facility];  Service: Endoscopy;  Laterality: N/A;  . EYE SURGERY     bilateral cataracts removed and vitrectomies  . JOINT REPLACEMENT Right    knee  . KNEE ARTHROSCOPY    . LAMINECTOMY    . LATERAL FUSION LUMBAR SPINE, TRANSVERSE    . LUMBAR LAMINECTOMY/DECOMPRESSION MICRODISCECTOMY Left 11/03/2012   Procedure: LUMBAR LAMINECTOMY/DECOMPRESSION MICRODISCECTOMY 1 LEVEL;  Surgeon: Hosie Spangle, MD;  Location: [facility];  Service: Neurosurgery;  Laterality: Left;  LEFT L5S1 laminotomy foraminotomy and possible microdiskectomy  . MELANOMA EXCISION    . NASAL SINUS SURGERY    . POSTERIOR CERVICAL FUSION/FORAMINOTOMY Right 10/01/2012   Procedure: POSTERIOR CERVICAL FUSION/FORAMINOTOMY LEVEL 1;  Surgeon: Hosie Spangle, MD;  Location: [facility];  Service: Neurosurgery;  Laterality: Right;  Right Cervical seven thoracic one Cervical laminectomy/foraminotomy with posterior cervical arthrodesis   . POSTERIOR LUMBAR FUSION N/A 05/23/2013   Procedure: exploration of lumbar wound, explantation of left interbody implant.;  Surgeon: Hosie Spangle, MD;  Location: [facility];  Service: Neurosurgery;  Laterality: N/A;  . RECTAL SURGERY     Fissure  . VITRECTOMY     right and left 2013   Social History   Social History  . Marital status: Married    Spouse name: N/A  . Number of children: 3  . Years of education: 12   Occupational History  .  Retired    Social History Main Topics  . Smoking status: Former Smoker    Packs/day: 1.00    Years: 33.00    Quit date: 09/29/1981  . Smokeless tobacco: Never Used  . Alcohol use 1.8 oz/week    3 Standard drinks or equivalent per week     Comment: Social  . Drug use: No  . Sexual activity: Yes    Partners: Female     Comment: Married   Other Topics Concern  . Not on file   Social History Narrative   Lives at home w/ his wife   Left-handed    Caffeine: 1 cup coffee daily   Allergies  Allergen Reactions  . Gabapentin     Lower extremity edema  . Lyrica [Pregabalin] Swelling   Family History  Problem Relation Age of Onset  . Dementia Mother   . Anemia Father   . Heart disease Father   . Stroke Sister     ICH  . Lung disease Sister     Past medical history, social, surgical and family history all reviewed in electronic medical record.  No pertanent information unless stated regarding to the chief complaint.   Review of Systems: No headache, visual changes, nausea, vomiting, diarrhea, constipation, dizziness, abdominal pain, skin rash, fevers, chills, night sweats, weight loss, swollen lymph nodess, chest pain, shortness of breath, mood changes.  Worsening muscle aches, joint swelling and body aches.  Objective  There were no vitals taken for this visit. Systems examined below as of 11/12/16   Systems examined below as of 11/12/16 General: NAD A&O x3 mood, affect normal  HEENT: Pupils equal, extraocular movements intact no nystagmus Respiratory: not short of breath at rest or with speaking Cardiovascular: No lower extremity edema, non tender Skin: Warm dry intact with no signs of infection or rash on extremities or on axial skeleton. Abdomen: Soft nontender, no masses Neuro: Cranial nerves  intact, neurovascularly intact in all extremities with 2+ DTRs and 2+ pulses. Lymph: No lymphadenopathy appreciated today  GaitAntalgic gait.  MSK:  Non tender with full range of motion and good stability and symmetric strength and tone of shoulders, elbows, wrist, hip, and ankles bilaterally. Significant arthritic changes of multiple joints.  Knee: Left Valgus deformity noted Severe tenderness to palpation over the medial joint line ROM full in flexion and extension and lower leg rotation. Instability noted with valgus force. Negative Mcmurray's, Apley's, and Thessalonian tests. painful patellar compression. Patellar glide  moderate crepitus. Patellar and quadriceps tendons unremarkable. Hamstring and quadriceps strength is normal.   After informed written and verbal consent, patient was seated on exam table. Left knee was prepped with alcohol swab and utilizing anterolateral approach, patient's left knee space was injected with 4:1  marcaine 0.5%: Kenalog 40mg /dL. Patient tolerated the procedure well without immediate complications.   Impression and Recommendations:     This case required medical decision making of moderate complexity.      Note: This dictation was prepared with Dragon dictation along with smaller phrase technology. Any transcriptional errors that result from this process are unintentional.

## 2016-11-13 ENCOUNTER — Ambulatory Visit: Payer: Medicare Other | Admitting: Family Medicine

## 2016-11-30 ENCOUNTER — Other Ambulatory Visit: Payer: Self-pay | Admitting: Internal Medicine

## 2016-11-30 DIAGNOSIS — H35372 Puckering of macula, left eye: Secondary | ICD-10-CM | POA: Diagnosis not present

## 2016-11-30 DIAGNOSIS — H26491 Other secondary cataract, right eye: Secondary | ICD-10-CM | POA: Diagnosis not present

## 2016-12-05 ENCOUNTER — Encounter: Payer: Self-pay | Admitting: Family Medicine

## 2016-12-07 DIAGNOSIS — H26491 Other secondary cataract, right eye: Secondary | ICD-10-CM | POA: Diagnosis not present

## 2016-12-10 ENCOUNTER — Other Ambulatory Visit: Payer: Self-pay | Admitting: Internal Medicine

## 2016-12-10 DIAGNOSIS — M5440 Lumbago with sciatica, unspecified side: Secondary | ICD-10-CM | POA: Diagnosis not present

## 2016-12-10 DIAGNOSIS — Z6829 Body mass index (BMI) 29.0-29.9, adult: Secondary | ICD-10-CM | POA: Diagnosis not present

## 2016-12-10 DIAGNOSIS — M4726 Other spondylosis with radiculopathy, lumbar region: Secondary | ICD-10-CM | POA: Diagnosis not present

## 2016-12-10 DIAGNOSIS — M5416 Radiculopathy, lumbar region: Secondary | ICD-10-CM | POA: Diagnosis not present

## 2016-12-10 DIAGNOSIS — M5136 Other intervertebral disc degeneration, lumbar region: Secondary | ICD-10-CM | POA: Diagnosis not present

## 2016-12-10 DIAGNOSIS — G5603 Carpal tunnel syndrome, bilateral upper limbs: Secondary | ICD-10-CM | POA: Diagnosis not present

## 2016-12-17 NOTE — Progress Notes (Signed)
[name] [address] [place], [place] [address] Phone: [phone] Subjective:      CC: left knee pain f/u  [id number]  [name] is a 83 y.o. male coming in with complaint of Left knee pain. Patient past medical history is significant for tricompartmental arthritis that has been seen on x-rays back in 2015. Patient does have injection 6 weeks ago and did have improvement for 3 weeks. Pain is starting to come back. Patient is the primary caregiver for his wife and is having difficulty. Patient describes pain as a dull, throbbing aching sensation.   also complaining of right ankle pain. Describes the pain as a dull, throbbing aching sensation. Thinks that he is compensating. Rates the severity pain as 9 out of 10. No injury.  Past Medical History:  Diagnosis Date  . ACTINIC KERATOSIS, HEAD 03/23/2008  . ALLERGIC RHINITIS 03/27/2007  . Arthritis   . BENIGN PROSTATIC HYPERTROPHY, WITH URINARY OBSTRUCTION 10/16/2007  . CAD 08/18/2007   "patient denies any issues with heart, does not see cardiologist"  . Carpal tunnel syndrome, bilateral 09/04/2016  . CARPAL TUNNEL SYNDROME, LEFT 04/13/2008  . Chronic back pain   . Chronic neck pain   . DVT (deep venous thrombosis) ([facility])   . ERECTILE DYSFUNCTION, ORGANIC 02/20/2010  . Gout, unspecified 02/20/2010  . Headache(784.0)    migraines hx of  . History of melanoma   . HYPERLIPIDEMIA, WITH HIGH HDL 10/18/2008  . HYPERTENSION 05/09/2007   sees Dr. Benay Pillow  . KNEE PAIN 10/10/2009  . LOW BACK PAIN 03/27/2007  . NEOPLASM, MALIGNANT, SKIN, TRUNK 05/09/2007  . Patellar tendinitis 10/10/2009  . SKIN CANCER, HX OF 03/27/2007  . UNS ADVRS EFF UNS RX MEDICINAL&BIOLOGICAL SBSTNC 08/18/2007  . Urinary urgency    Past Surgical History:  Procedure Laterality Date  . APPENDECTOMY  1969  . BACK SURGERY    . BLEPHAROPLASTY    . CARPAL TUNNEL RELEASE    . CHOLECYSTECTOMY    . ESOPHAGOGASTRODUODENOSCOPY (EGD) WITH  PROPOFOL N/A 04/26/2015   Procedure: ESOPHAGOGASTRODUODENOSCOPY (EGD) WITH PROPOFOL;  Surgeon: Inda Castle, MD;  Location: [facility];  Service: Endoscopy;  Laterality: N/A;  . EYE SURGERY     bilateral cataracts removed and vitrectomies  . JOINT REPLACEMENT Right    knee  . KNEE ARTHROSCOPY    . LAMINECTOMY    . LATERAL FUSION LUMBAR SPINE, TRANSVERSE    . LUMBAR LAMINECTOMY/DECOMPRESSION MICRODISCECTOMY Left 11/03/2012   Procedure: LUMBAR LAMINECTOMY/DECOMPRESSION MICRODISCECTOMY 1 LEVEL;  Surgeon: Hosie Spangle, MD;  Location: [facility];  Service: Neurosurgery;  Laterality: Left;  LEFT L5S1 laminotomy foraminotomy and possible microdiskectomy  . MELANOMA EXCISION    . NASAL SINUS SURGERY    . POSTERIOR CERVICAL FUSION/FORAMINOTOMY Right 10/01/2012   Procedure: POSTERIOR CERVICAL FUSION/FORAMINOTOMY LEVEL 1;  Surgeon: Hosie Spangle, MD;  Location: [facility];  Service: Neurosurgery;  Laterality: Right;  Right Cervical seven thoracic one Cervical laminectomy/foraminotomy with posterior cervical arthrodesis   . POSTERIOR LUMBAR FUSION N/A 05/23/2013   Procedure: exploration of lumbar wound, explantation of left interbody implant.;  Surgeon: Hosie Spangle, MD;  Location: [facility];  Service: Neurosurgery;  Laterality: N/A;  . RECTAL SURGERY     Fissure  . VITRECTOMY     right and left 2013   Social History   Social History  . Marital status: Married    Spouse name: N/A  . Number of children: 3  . Years of  education: 12   Occupational History  . Retired    Social History Main Topics  . Smoking status: Former Smoker    Packs/day: 1.00    Years: 33.00    Quit date: 09/29/1981  . Smokeless tobacco: Never Used  . Alcohol use 1.8 oz/week    3 Standard drinks or equivalent per week     Comment: Social  . Drug use: No  . Sexual activity: Yes    Partners: Female     Comment: Married   Other Topics Concern  . None   Social History Narrative   Lives at home w/  his wife   Left-handed   Caffeine: 1 cup coffee daily   Allergies  Allergen Reactions  . Gabapentin     Lower extremity edema  . Lyrica [Pregabalin] Swelling   Family History  Problem Relation Age of Onset  . Dementia Mother   . Anemia Father   . Heart disease Father   . Stroke Sister     ICH  . Lung disease Sister     Past medical history, social, surgical and family history all reviewed in electronic medical record.  No pertanent information unless stated regarding to the chief complaint.   Review of Systems: No headache, visual changes, nausea, vomiting, diarrhea, constipation, dizziness, abdominal pain, skin rash, fevers, chills, night sweats, weight loss, swollen lymph nodess, chest pain, shortness of breath, mood changes.  Worsening muscle aches, joint swelling and body aches.  Objective  Blood pressure (!) 142/72, pulse 78, height 6' (1.829 m), weight 214 lb (97.1 kg).   Systems examined below as of 12/18/16 General: NAD A&O x3 mood, affect normal  HEENT: Pupils equal, extraocular movements intact no nystagmus Respiratory: not short of breath at rest or with speaking Cardiovascular: No lower extremity edema, non tender Skin: bruising noted.  Abdomen: Soft nontender, no masses Neuro: Cranial nerves  intact, neurovascularly intact in all extremities with 2+ DTRs and 2+ pulses. Lymph: No lymphadenopathy appreciated today  GaitAntalgic gait.  MSK:  Non tender with full range of motion and good stability and symmetric strength and tone of shoulders, elbows, wrist, hip, and bilaterally. Significant arthritic changes of multiple joints.  Ankle: Right Trace effusion Decreased range of motion lacking the last 10 of extension and 5 of flexion Strength is 4/5 in all directions. Stable lateral and medial ligaments; squeeze test and kleiger test unremarkable; Talar dome nontender; No pain at base of 5th MT; No tenderness over cuboid; No tenderness over N spot or navicular  prominence No tenderness on posterior aspects of lateral and medial malleolus No sign of peroneal tendon subluxations or tenderness to palpation Negative tarsal tunnel tinel's Able to walk 4 steps.  Knee: Left valgus deformity noted. Large thigh to calf ratio.  Tender to palpation over medial and PF joint line.  ROM full in flexion and extension and lower leg rotation. instability with valgus force.  painful patellar compression. Patellar glide with moderate crepitus. Patellar and quadriceps tendons unremarkable. Hamstring and quadriceps strength is normal. Contralateral knee shows  arthritic changes but nontender  After informed written and verbal consent, patient was seated on exam table. Left knee was prepped with alcohol swab and utilizing anterolateral approach, patient's left knee space was injected with 4 mL of hyaluronic acid 22 mg/dL Patient tolerated the procedure well without immediate complications.  Procedure: Real-time Ultrasound Guided Injection of right ankle mortise Device: GE Logiq Q7 Ultrasound guided injection is preferred based studies that show increased duration, increased effect,  greater accuracy, decreased procedural pain, increased response rate, and decreased cost with ultrasound guided versus blind injection.  Verbal informed consent obtained.  Time-out conducted.  Noted no overlying erythema, induration, or other signs of local infection.  Skin prepped in a sterile fashion.  Local anesthesia: Topical Ethyl chloride.  With sterile technique and under real time ultrasound guidance:  With a 25-gauge 1 inch needle patient was injected with a total of 2 mL of 0.5% Marcaine and 1 mL of Kenalog 40 mg/dL. Completed without difficulty  Pain immediately resolved suggesting accurate placement of the medication.  Advised to call if fevers/chills, erythema, induration, drainage, or persistent bleeding.  Images permanently stored and available for review in the  ultrasound unit.  Impression: Technically successful ultrasound guided injection.   Impression and Recommendations:     This case required medical decision making of moderate complexity.      Note: This dictation was prepared with Dragon dictation along with smaller phrase technology. Any transcriptional errors that result from this process are unintentional.

## 2016-12-18 ENCOUNTER — Ambulatory Visit (INDEPENDENT_AMBULATORY_CARE_PROVIDER_SITE_OTHER): Payer: Medicare Other | Admitting: Family Medicine

## 2016-12-18 ENCOUNTER — Ambulatory Visit: Payer: Self-pay

## 2016-12-18 ENCOUNTER — Encounter: Payer: Self-pay | Admitting: Family Medicine

## 2016-12-18 VITALS — BP 142/72 | HR 78 | Ht 72.0 in | Wt 214.0 lb

## 2016-12-18 DIAGNOSIS — M19071 Primary osteoarthritis, right ankle and foot: Secondary | ICD-10-CM | POA: Diagnosis not present

## 2016-12-18 DIAGNOSIS — M79671 Pain in right foot: Secondary | ICD-10-CM

## 2016-12-18 DIAGNOSIS — M1712 Unilateral primary osteoarthritis, left knee: Secondary | ICD-10-CM

## 2016-12-18 NOTE — Patient Instructions (Signed)
Good to see you  Did monovisc on your knee.  Injected your ankle as well.  Ice is your friend.  Good shoes with rigid bottom.  Jalene Mullet, Merrell or New balance greater then 700 Try to avoid being barefoot Continue the vitamins See me again in 4-6 weeks.

## 2016-12-18 NOTE — Assessment & Plan Note (Signed)
Given injection today. Tolerated the procedure well. Discussed with patient to monitor her certain side effects. We discussed possible bracing which patient declined. Decline formal physical therapy. Given some mild range of motion home exercises. Hopefully that this will do well. Follow-up again in 4 weeks

## 2016-12-18 NOTE — Assessment & Plan Note (Signed)
Injecting Monovisc 12/18/2016. Responded well to the conservative treatment at this moment. Has failed all other conservative segment. We discussed icing regimen, home exercises and bracing. Patient will try this and follow-up with me again in 4 weeks

## 2016-12-31 ENCOUNTER — Other Ambulatory Visit: Payer: Self-pay | Admitting: Internal Medicine

## 2017-01-01 ENCOUNTER — Encounter: Payer: Self-pay | Admitting: Family Medicine

## 2017-01-03 ENCOUNTER — Encounter: Payer: Self-pay | Admitting: Internal Medicine

## 2017-01-05 ENCOUNTER — Ambulatory Visit (INDEPENDENT_AMBULATORY_CARE_PROVIDER_SITE_OTHER): Payer: Medicare Other | Admitting: Family Medicine

## 2017-01-05 ENCOUNTER — Encounter: Payer: Self-pay | Admitting: Family Medicine

## 2017-01-05 VITALS — BP 128/82 | HR 76 | Temp 98.6°F | Resp 16 | Ht 72.0 in | Wt 208.0 lb

## 2017-01-05 DIAGNOSIS — L03115 Cellulitis of right lower limb: Secondary | ICD-10-CM | POA: Diagnosis not present

## 2017-01-05 DIAGNOSIS — R1031 Right lower quadrant pain: Secondary | ICD-10-CM

## 2017-01-05 DIAGNOSIS — L298 Other pruritus: Secondary | ICD-10-CM

## 2017-01-05 MED ORDER — DOXYCYCLINE HYCLATE 100 MG PO TABS: 100.0000 mg | ORAL_TABLET | Freq: Two times a day (BID) | ORAL | 0 refills | Status: DC

## 2017-01-05 MED ORDER — CLOTRIMAZOLE-BETAMETHASONE 1-0.05 % EX CREA: 1.0000 "application " | TOPICAL_CREAM | Freq: Two times a day (BID) | CUTANEOUS | 0 refills | Status: AC

## 2017-01-05 NOTE — Progress Notes (Signed)
HPI:   ACUTE VISIT:  Chief Complaint  Patient presents with  . right foot pain    itching between toes and painful across the top of right foot x 1 week    [name] is a 83 y.o. male, who is here today complaining of 4 days of gradual onset of right foot edema and erythema. Stated with pruritus in between 4th-5th toe later same day he noted achy pain on dorsal-anterior aspect of foot. Pain is "bad", exacerbated by walking and alleviated by rest, it is intermittent.  He mentions that 2-3 weeks ago he received steroid injection in right ankle.  He denies any injury. He denies tingling, numbness, cyanosis, or cold extremity.  No fever but has had 2 days of chills. Problem is getting worse.   Hip Pain   The pain is present in the right hip. The pain is moderate. The pain has been intermittent since onset. Pertinent negatives include no numbness or tingling. The symptoms are aggravated by movement. He has tried nothing for the symptoms.    Describes pain as "catching" sensation in right groin area when he gets up from sitting and alleviated by walking.He wonders if this is related with foot pain. It has been going on for a month. No limitation of hip ROM. Denies abdominal pain, nausea, vomiting, changes in bowel habits, blood in stool, or urinary symptoms.   Review of Systems  Constitutional: Positive for chills. Negative for appetite change, fatigue and fever.  Respiratory: Negative for shortness of breath and wheezing.   Cardiovascular: Positive for leg swelling. Negative for chest pain and palpitations.  Gastrointestinal: Negative for abdominal pain, blood in stool, nausea and vomiting.       No changes in bowel habits.  Genitourinary: Negative for dysuria, hematuria and urgency.  Musculoskeletal: Positive for arthralgias. Negative for back pain and joint swelling.  Skin: Positive for rash. Negative for wound.  Neurological: Negative for tingling, weakness,  numbness and headaches.  Hematological: Negative for adenopathy. Does not bruise/bleed easily.  Psychiatric/Behavioral: Negative for confusion. The patient is not nervous/anxious.       Current Outpatient Prescriptions on File Prior to Visit  Medication Sig Dispense Refill  . amLODipine (NORVASC) 5 MG tablet TAKE 1 TABLET (5 MG TOTAL) BY MOUTH DAILY. 90 tablet 1  . carvedilol (COREG) 25 MG tablet TAKE 1 TABLET BY MOUTH 2 TIMES DAILY WITH A MEAL 60 tablet 11  . finasteride (PROSCAR) 5 MG tablet TAKE 1 TABLET BY MOUTH EVERY DAY 30 tablet 3  . fluticasone (FLONASE) 50 MCG/ACT nasal spray PLACE 2 SPRAYS IN TO BOTH NOSTRILS DAILY 16 g 6  . losartan (COZAAR) 50 MG tablet TAKE 1 TABLET (50 MG TOTAL) BY MOUTH DAILY. 30 tablet 6  . magnesium hydroxide (MILK OF MAGNESIA) 800 MG/5ML suspension Take 15 mLs by mouth every other day.    . Oxycodone HCl 10 MG TABS Take 10 mg by mouth 3 (three) times daily as needed (pain).   0  . predniSONE (DELTASONE) 10 MG tablet Take 1 tablet (10 mg total) by mouth daily with breakfast. Needs visit with labs for further refills 30 tablet 0  . tamsulosin (FLOMAX) 0.4 MG CAPS capsule TAKE 1 CAPSULE (0.4 MG TOTAL) BY MOUTH DAILY. 30 capsule 6  . triamcinolone cream (KENALOG) 0.5 % USE AS DIRECTED 60 g 0  . vitamin B-12 (CYANOCOBALAMIN) 1000 MCG tablet Take 1 tablet (1,000 mcg total) by mouth daily. 90 tablet 3   No  current facility-administered medications on file prior to visit.      Past Medical History:  Diagnosis Date  . ACTINIC KERATOSIS, HEAD 03/23/2008  . ALLERGIC RHINITIS 03/27/2007  . Arthritis   . BENIGN PROSTATIC HYPERTROPHY, WITH URINARY OBSTRUCTION 10/16/2007  . CAD 08/18/2007   "patient denies any issues with heart, does not see cardiologist"  . Carpal tunnel syndrome, bilateral 09/04/2016  . CARPAL TUNNEL SYNDROME, LEFT 04/13/2008  . Chronic back pain   . Chronic neck pain   . DVT (deep venous thrombosis) ([facility])   . ERECTILE DYSFUNCTION, ORGANIC 02/20/2010   . Gout, unspecified 02/20/2010  . Headache(784.0)    migraines hx of  . History of melanoma   . HYPERLIPIDEMIA, WITH HIGH HDL 10/18/2008  . HYPERTENSION 05/09/2007   sees Dr. Benay Pillow  . KNEE PAIN 10/10/2009  . LOW BACK PAIN 03/27/2007  . NEOPLASM, MALIGNANT, SKIN, TRUNK 05/09/2007  . Patellar tendinitis 10/10/2009  . SKIN CANCER, HX OF 03/27/2007  . UNS ADVRS EFF UNS RX MEDICINAL&BIOLOGICAL SBSTNC 08/18/2007  . Urinary urgency    Allergies  Allergen Reactions  . Gabapentin     Lower extremity edema  . Lyrica [Pregabalin] Swelling    Social History   Social History  . Marital status: Married    Spouse name: N/A  . Number of children: 3  . Years of education: 12   Occupational History  . Retired    Social History Main Topics  . Smoking status: Former Smoker    Packs/day: 1.00    Years: 33.00    Quit date: 09/29/1981  . Smokeless tobacco: Never Used  . Alcohol use 1.8 oz/week    3 Standard drinks or equivalent per week     Comment: Social  . Drug use: No  . Sexual activity: Yes    Partners: Female     Comment: Married   Other Topics Concern  . None   Social History Narrative   Lives at home w/ his wife   Left-handed   Caffeine: 1 cup coffee daily    Vitals:   01/05/17 1232  BP: 128/82  Pulse: 76  Resp: 16  Temp: 98.6 F (37 C)  O2 sat at RA 98% Body mass index is 28.21 kg/m.   Physical Exam  Nursing note and vitals reviewed. Constitutional: He is oriented to person, place, and time. He appears well-developed. No distress.  HENT:  Head: Normocephalic and atraumatic.  Mouth/Throat: Oropharynx is clear and moist and mucous membranes are normal.  Eyes: Conjunctivae and EOM are normal.  Cardiovascular: Normal rate and regular rhythm.   Pulses:      Dorsalis pedis pulses are 2+ on the right side, and 2+ on the left side.  Varicose veins LE bilateral.  Respiratory: Effort normal and breath sounds normal. No respiratory distress.  GI: Soft. He exhibits  no mass. There is no tenderness. No hernia.  Musculoskeletal: He exhibits edema (Trace pitting edema LE bilateral.Right ankle and mild pedal edema).       Right ankle: He exhibits swelling.       Lumbar back: He exhibits no tenderness and no bony tenderness.       Feet:  Hip ROM with no significant limitation, pain is not elicit. Mild pain with palpation on right inguinal area.   Neurological: He is alert and oriented to person, place, and time. He has normal strength. Gait normal.  Skin: Skin is warm. Rash noted. There is erythema.  Superficial erythema and local heat on  dorsal aspect of right foot (MTP area from 2nd to 4th toe), tender. Mild scaly and minimal erythema with no local heat in between 4th and 5th toe. No limitation of MTP joints. See Musc graph  Psychiatric: He has a normal mood and affect.  Well groomed, good eye contact.      ASSESSMENT AND PLAN:   Emannuel was seen today for right foot pain.  Diagnoses and all orders for this visit:  Cellulitis of right foot  Mild. Some side effects of abx discussed. Monitor for signs of complications. LE elevation. F/U in 3-4 days if symptoms persist.  -     doxycycline (VIBRA-TABS) 100 MG tablet; Take 1 tablet (100 mg total) by mouth 2 (two) times daily.  Pruritic erythematous rash  ? Athlete's foot, insect bite,dry skin among some to consider. Topical treatment recommended, small amount and for up to 14 days. Keep area clean and dry.   -     clotrimazole-betamethasone (LOTRISONE) cream; Apply 1 application topically 2 (two) times daily.  Groin pain, right  ? Muscle strain. I did not appreciate hernia. OTC Tylenol,local heat,and ROM exercises may help. F/U with PCP if needed.    -Mr.JOURDAN DURBIN advised to seek immediate medical attention is symptoms suddenly get worse or to follow if symptoms persist or new concerns arise.      Shanedra Lave G. Martin[place], MD  [facility]. [place]  office.

## 2017-01-05 NOTE — Patient Instructions (Addendum)
  [name] I have seen you today for an acute visit.  A few things to remember from today's visit:   Groin pain, right  Cellulitis of right foot - Plan: doxycycline (VIBRA-TABS) 100 MG tablet  Athlete's foot, right - Plan: clotrimazole-betamethasone (LOTRISONE) cream   Med wit food.   ? Hip arthritis, pull muscle.    Medications prescribed today are intended for short period of time and will not be refill upon request, a follow up appointment might be necessary to discuss continuation of of treatment if appropriate.    In general please monitor for signs of worsening symptoms and seek immediate medical attention if any concerning.  If symptoms are not resolved in 3-4 days you should schedule a follow up appointment with your doctor, before if needed.

## 2017-01-10 ENCOUNTER — Encounter: Payer: Self-pay | Admitting: Family Medicine

## 2017-01-10 NOTE — Telephone Encounter (Signed)
Pt called in and would like to be worked in Brewing technologist.  He would like and injection in his knee Best number [phone]

## 2017-01-11 ENCOUNTER — Ambulatory Visit (INDEPENDENT_AMBULATORY_CARE_PROVIDER_SITE_OTHER): Payer: Medicare Other | Admitting: Sports Medicine

## 2017-01-11 ENCOUNTER — Encounter: Payer: Self-pay | Admitting: Sports Medicine

## 2017-01-11 ENCOUNTER — Encounter: Payer: Self-pay | Admitting: Family Medicine

## 2017-01-11 ENCOUNTER — Ambulatory Visit: Payer: Self-pay

## 2017-01-11 VITALS — BP 96/62 | HR 74 | Ht 72.0 in | Wt 205.4 lb

## 2017-01-11 DIAGNOSIS — M1712 Unilateral primary osteoarthritis, left knee: Secondary | ICD-10-CM | POA: Diagnosis not present

## 2017-01-11 NOTE — Assessment & Plan Note (Signed)
Repeat injection today.  Discussed that increasing frequency of injections is likely an indication for consideration of total knee arthroplasty.  Patient voiced understanding but wishes to proceed with repeat injection today.  We will try 80 mg Depo-Medrol to see if this is any additional benefit form.  Also consider using colchicine going for given the findings of chondral calcinosis on x-ray.  This may be pseudogout that is superimposed on arthritis which can benefit during acute flares.  He would like to talk to Dr. Tamala Julian about this before initiating.  PROCEDURE NOTE -  ULTRASOUND GUIDEDINJECTION: Left knee Images were obtained and interpreted by myself, Teresa Coombs, DO  Images have been saved and stored to PACS system. Images obtained on: GE S7 Ultrasound machine  ULTRASOUND FINDINGS: Supraphysiologic effusion  DESCRIPTION OF PROCEDURE:  The patient's clinical condition is marked by substantial pain and/or significant functional disability. Other conservative therapy has not provided relief, is contraindicated, or not appropriate. There is a reasonable likelihood that injection will significantly improve the patient's pain and/or functional impairment. After discussing the risks, benefits and expected outcomes of the injection and all questions were reviewed and answered, the patient wished to undergo the above named procedure. Verbal consent was obtained. The ultrasound was used to identify the target structure and adjacent neurovascular structures. The skin was then prepped in sterile fashion and the target structure was injected under direct visualization using sterile technique as below: PREP: Alcohol, Ethel Chloride APPROACH: Superior lateral,single injection, 25g 1.5" needle INJECTATE: 2cc 0.5% marcaine, 2cc 40mg  DepoMedrol ASPIRATE: N/A DRESSING: Band-Aid  Post procedural instructions including recommending icing and warning signs for infection were reviewed. This procedure was  well tolerated and there were no complications.   IMPRESSION: Succesful US Guided Injection

## 2017-01-11 NOTE — Progress Notes (Signed)
OFFICE VISIT NOTE [name] [name], [place] at [facility] - 83 y.o. male MRN [id number]  Date of birth: [birth year]  Visit Date: 01/11/2017  PCP: Hoyt Koch, MD   Referred by: Gayla Doss, CMA acting as scribe for Dr. [name].  SUBJECTIVE:   Chief Complaint  Patient presents with  . Follow-up    osteoarthritis of the left knee   HPI: As below and per problem based documentation when appropriate.  Pt presents today in follow-up of left knee pain. Pt is requesting an injection. Last steroid injection with done by Dr. Tamala Julian 12/18/16. Pain is mostly medial. Knee pain is a chronic issue for pt.  Pt has dx of osteoarthritis.   The pain is described as sharp pain on the medial aspect of the knee and dull pain on the lateral aspect of the knee. Pain is rated as 8/10.  Worsened with walking, going up and down stairs (harder going up stairs), bending and extending the knee.  Improves with steroid injections. Pt does not ice the knee or use heat therapy, he has in the past and got no relief so he no longer attempts this.  Therapies tried include : Oxycodone, pt gets some relief with this.   Other associated symptoms include: Pt denies associated sx, no pain in hip, ankle, upper or lower leg.   Pt denies fever, chills, night sweats, unintentional weight loss or weight gain.     Review of Systems  Constitutional: Negative for chills, fever and weight loss.  Respiratory: Negative for shortness of breath and wheezing.   Cardiovascular: Positive for leg swelling. Negative for chest pain and palpitations.  Musculoskeletal: Positive for joint pain. Negative for falls.  Neurological: Negative for dizziness, tingling and headaches.  Endo/Heme/Allergies: Does not bruise/bleed easily.    Otherwise per HPI.  HISTORY & PERTINENT PRIOR DATA:  No specialty comments available. He reports  that he quit smoking about 35 years ago. He has a 33.00 pack-year smoking history. He has never used smokeless tobacco. No results for input(s): HGBA1C, LABURIC in the last 8760 hours. Medications & Allergies reviewed per EMR Patient Active Problem List   Diagnosis Date Noted  . Arthritis of right ankle 12/18/2016  . Cough 09/11/2016  . Carpal tunnel syndrome, bilateral 09/04/2016  . Leg swelling 08/16/2016  . Hand weakness 07/12/2016  . Degenerative arthritis of left knee 06/28/2016  . Iron deficiency anemia 03/16/2016  . Arthritis of left acromioclavicular joint 01/03/2016  . Rotator cuff disorder 01/03/2016  . Arthritis of left lower extremity 04/30/2015  . Baker's cyst of knee 04/30/2015  . Esophageal stricture 04/26/2015  . BPH (benign prostatic hypertrophy) 02/21/2015  . Constipation 02/21/2015  . Myalgia and myositis 11/18/2014  . Polyarticular arthritis 07/19/2013  . Chronic radicular low back pain 12/15/2012  . History of gout 02/20/2010  . ERECTILE DYSFUNCTION, ORGANIC 02/20/2010  . HYPERLIPIDEMIA, WITH HIGH HDL 10/18/2008  . CAD 08/18/2007  . Essential hypertension 05/09/2007  . ALLERGIC RHINITIS 03/27/2007   Past Medical History:  Diagnosis Date  . ACTINIC KERATOSIS, HEAD 03/23/2008  . ALLERGIC RHINITIS 03/27/2007  . Arthritis   . BENIGN PROSTATIC HYPERTROPHY, WITH URINARY OBSTRUCTION 10/16/2007  . CAD 08/18/2007   "patient denies any issues with heart, does not see cardiologist"  . Carpal tunnel syndrome, bilateral 09/04/2016  . CARPAL TUNNEL SYNDROME, LEFT 04/13/2008  . Chronic back pain   . Chronic neck  pain   . DVT (deep venous thrombosis) ([place])   . ERECTILE DYSFUNCTION, ORGANIC 02/20/2010  . Gout, unspecified 02/20/2010  . Headache(784.0)    migraines hx of  . History of melanoma   . HYPERLIPIDEMIA, WITH HIGH HDL 10/18/2008  . HYPERTENSION 05/09/2007   sees Dr. Benay Pillow  . KNEE PAIN 10/10/2009  . LOW BACK PAIN 03/27/2007  . NEOPLASM, MALIGNANT, SKIN, TRUNK  05/09/2007  . Patellar tendinitis 10/10/2009  . SKIN CANCER, HX OF 03/27/2007  . UNS ADVRS EFF UNS RX MEDICINAL&BIOLOGICAL SBSTNC 08/18/2007  . Urinary urgency    Family History  Problem Relation Age of Onset  . Dementia Mother   . Anemia Father   . Heart disease Father   . Stroke Sister        ICH  . Lung disease Sister    Past Surgical History:  Procedure Laterality Date  . APPENDECTOMY  1969  . BACK SURGERY    . BLEPHAROPLASTY    . CARPAL TUNNEL RELEASE    . CHOLECYSTECTOMY    . ESOPHAGOGASTRODUODENOSCOPY (EGD) WITH PROPOFOL N/A 04/26/2015   Procedure: ESOPHAGOGASTRODUODENOSCOPY (EGD) WITH PROPOFOL;  Surgeon: Inda Castle, MD;  Location: [facility];  Service: Endoscopy;  Laterality: N/A;  . EYE SURGERY     bilateral cataracts removed and vitrectomies  . JOINT REPLACEMENT Right    knee  . KNEE ARTHROSCOPY    . LAMINECTOMY    . LATERAL FUSION LUMBAR SPINE, TRANSVERSE    . LUMBAR LAMINECTOMY/DECOMPRESSION MICRODISCECTOMY Left 11/03/2012   Procedure: LUMBAR LAMINECTOMY/DECOMPRESSION MICRODISCECTOMY 1 LEVEL;  Surgeon: Hosie Spangle, MD;  Location: [place] [facility];  Service: Neurosurgery;  Laterality: Left;  LEFT L5S1 laminotomy foraminotomy and possible microdiskectomy  . MELANOMA EXCISION    . NASAL SINUS SURGERY    . POSTERIOR CERVICAL FUSION/FORAMINOTOMY Right 10/01/2012   Procedure: POSTERIOR CERVICAL FUSION/FORAMINOTOMY LEVEL 1;  Surgeon: Hosie Spangle, MD;  Location: [place] [facility];  Service: Neurosurgery;  Laterality: Right;  Right Cervical seven thoracic one Cervical laminectomy/foraminotomy with posterior cervical arthrodesis   . POSTERIOR LUMBAR FUSION N/A 05/23/2013   Procedure: exploration of lumbar wound, explantation of left interbody implant.;  Surgeon: Hosie Spangle, MD;  Location: [place];  Service: Neurosurgery;  Laterality: N/A;  . RECTAL SURGERY     Fissure  . VITRECTOMY     right and left 2013   Social History   Occupational History  . Retired     Social History Main Topics  . Smoking status: Former Smoker    Packs/day: 1.00    Years: 33.00    Quit date: 09/29/1981  . Smokeless tobacco: Never Used  . Alcohol use 1.8 oz/week    3 Standard drinks or equivalent per week     Comment: Social  . Drug use: No  . Sexual activity: Yes    Partners: Female     Comment: Married    OBJECTIVE:  VS:  HT:6' (182.9 cm)   WT:205 lb 6.4 oz (93.2 kg)  BMI:27.9    BP:96/62  HR:74bpm  TEMP: ( )  RESP:97 % EXAM: Findings:  WDWN, NAD, Non-toxic appearing Alert & appropriately interactive Not depressed or anxious appearing No increased work of breathing. Pupils are equal. EOM intact without nystagmus No clubbing or cyanosis of the extremities appreciated No significant rashes/lesions/ulcerations overlying the examined area. DP PT pulses 1+/4.  No significant pretibial edema.   Sensation intact to light touch in lower extremities.  Left Knee: Overall joint is well aligned.  He does have fairly significant osteoarthritic bossing. No significant effusion.   ROM: 5 to 115.   Extensor mechanism intact Generalized TTP over the medial lateral joint lines.  Pain with patellar grind. Stable to varus/valgus strain.          ASSESSMENT & PLAN:   Problem List Items Addressed This Visit    Degenerative arthritis of left knee - Primary    Repeat injection today.  Discussed that increasing frequency of injections is likely an indication for consideration of total knee arthroplasty.  Patient voiced understanding but wishes to proceed with repeat injection today.  We will try 80 mg Depo-Medrol to see if this is any additional benefit form.  Also consider using colchicine going for given the findings of chondral calcinosis on x-ray.  This may be pseudogout that is superimposed on arthritis which can benefit during acute flares.  He would like to talk to Dr. Tamala Julian about this before initiating.  PROCEDURE NOTE -  ULTRASOUND GUIDEDINJECTION:  Left knee Images were obtained and interpreted by myself, Teresa Coombs, DO  Images have been saved and stored to PACS system. Images obtained on: GE S7 Ultrasound machine  ULTRASOUND FINDINGS: Supraphysiologic effusion  DESCRIPTION OF PROCEDURE:  The patient's clinical condition is marked by substantial pain and/or significant functional disability. Other conservative therapy has not provided relief, is contraindicated, or not appropriate. There is a reasonable likelihood that injection will significantly improve the patient's pain and/or functional impairment. After discussing the risks, benefits and expected outcomes of the injection and all questions were reviewed and answered, the patient wished to undergo the above named procedure. Verbal consent was obtained. The ultrasound was used to identify the target structure and adjacent neurovascular structures. The skin was then prepped in sterile fashion and the target structure was injected under direct visualization using sterile technique as below: PREP: Alcohol, Ethel Chloride APPROACH: Superior lateral,single injection, 25g 1.5" needle INJECTATE: 2cc 0.5% marcaine, 2cc 40mg  DepoMedrol ASPIRATE: N/A DRESSING: Band-Aid  Post procedural instructions including recommending icing and warning signs for infection were reviewed. This procedure was well tolerated and there were no complications.   IMPRESSION: Succesful US Guided Injection          Relevant Orders   [place] LIMITED JOINT SPACE STRUCTURES LOW LEFT(NO LINKED CHARGES)      Follow-up: Return if symptoms worsen or fail to improve, for follow-up with Dr. Tamala Julian as needed.   CMA/ATC served as Education administrator during this visit. History, Physical, and Plan performed by medical provider. Documentation and orders reviewed and attested to.      Teresa Coombs, [facility] Physician

## 2017-01-15 ENCOUNTER — Encounter: Payer: Self-pay | Admitting: Sports Medicine

## 2017-01-18 ENCOUNTER — Encounter: Payer: Self-pay | Admitting: Internal Medicine

## 2017-01-21 ENCOUNTER — Other Ambulatory Visit: Payer: Self-pay | Admitting: Internal Medicine

## 2017-01-21 ENCOUNTER — Encounter: Payer: Self-pay | Admitting: Internal Medicine

## 2017-01-22 MED ORDER — TORSEMIDE 10 MG PO TABS: 10.0000 mg | ORAL_TABLET | Freq: Every day | ORAL | 0 refills | Status: DC | PRN

## 2017-01-25 ENCOUNTER — Encounter: Payer: Self-pay | Admitting: Internal Medicine

## 2017-01-25 ENCOUNTER — Emergency Department (HOSPITAL_COMMUNITY)
Admission: EM | Admit: 2017-01-25 | Discharge: 2017-01-25 | Discharge status: Against Medical Advice | Payer: Medicare Other | Attending: Emergency Medicine | Admitting: Emergency Medicine

## 2017-01-25 ENCOUNTER — Encounter (HOSPITAL_COMMUNITY): Payer: Self-pay | Admitting: *Deleted

## 2017-01-25 DIAGNOSIS — R55 Syncope and collapse: Secondary | ICD-10-CM | POA: Insufficient documentation

## 2017-01-25 DIAGNOSIS — Z5321 Procedure and treatment not carried out due to patient leaving prior to being seen by health care provider: Secondary | ICD-10-CM | POA: Diagnosis not present

## 2017-01-25 LAB — BASIC METABOLIC PANEL
Anion gap: 10 (ref 5–15)
BUN: 31 mg/dL — ABNORMAL HIGH (ref 6–20)
CO2: 26 mmol/L (ref 22–32)
Calcium: 9 mg/dL (ref 8.9–10.3)
Chloride: 100 mmol/L — ABNORMAL LOW (ref 101–111)
Creatinine, Ser: 1.46 mg/dL — ABNORMAL HIGH (ref 0.61–1.24)
GFR calc Af Amer: 49 mL/min — ABNORMAL LOW (ref >60)
GFR calc non Af Amer: 43 mL/min — ABNORMAL LOW (ref >60)
Glucose, Bld: 110 mg/dL — ABNORMAL HIGH (ref 65–99)
Potassium: 4.2 mmol/L (ref 3.5–5.1)
Sodium: 136 mmol/L (ref 135–145)

## 2017-01-25 LAB — CBC
HCT: 41.2 % (ref 39.0–52.0)
Hemoglobin: 13.3 g/dL (ref 13.0–17.0)
MCH: 28.7 pg (ref 26.0–34.0)
MCHC: 32.3 g/dL (ref 30.0–36.0)
MCV: 88.8 fL (ref 78.0–100.0)
Platelets: 368 10*3/uL (ref 150–400)
RBC: 4.64 MIL/uL (ref 4.22–5.81)
RDW: 13.6 % (ref 11.5–15.5)
WBC: 12.9 10*3/uL — ABNORMAL HIGH (ref 4.0–10.5)

## 2017-01-25 LAB — I-STAT TROPONIN, ED: Troponin i, poc: 0 ng/mL (ref 0.00–0.08)

## 2017-01-25 NOTE — ED Triage Notes (Signed)
Pt arrives from home after having a syncopal episode that lasted only seconds today. Pt states he has been experiencing dizziness x3-4 days and "blacked out" today. Pt endorses bilateral knee pain from the fall today denies any h/a.

## 2017-01-25 NOTE — ED Notes (Signed)
At desk stating I am leaving it is too long to wait.  Encouraged to stay without success.  Left at this time with family member.  Encouraged to return as needed.

## 2017-01-28 ENCOUNTER — Encounter: Payer: Self-pay | Admitting: Nurse Practitioner

## 2017-01-28 ENCOUNTER — Ambulatory Visit (INDEPENDENT_AMBULATORY_CARE_PROVIDER_SITE_OTHER): Payer: Medicare Other | Admitting: Nurse Practitioner

## 2017-01-28 ENCOUNTER — Ambulatory Visit (INDEPENDENT_AMBULATORY_CARE_PROVIDER_SITE_OTHER)
Admission: RE | Admit: 2017-01-28 | Discharge: 2017-01-28 | Disposition: A | Discharge status: Home / Self Care | Payer: Medicare Other | Source: Ambulatory Visit | Attending: Nurse Practitioner | Admitting: Nurse Practitioner

## 2017-01-28 VITALS — BP 100/52 | HR 73 | Temp 98.2°F | Ht 72.0 in | Wt 210.0 lb

## 2017-01-28 DIAGNOSIS — R55 Syncope and collapse: Secondary | ICD-10-CM

## 2017-01-28 DIAGNOSIS — W57XXXA Bitten or stung by nonvenomous insect and other nonvenomous arthropods, initial encounter: Secondary | ICD-10-CM

## 2017-01-28 DIAGNOSIS — S80861A Insect bite (nonvenomous), right lower leg, initial encounter: Secondary | ICD-10-CM | POA: Diagnosis not present

## 2017-01-28 DIAGNOSIS — M25561 Pain in right knee: Secondary | ICD-10-CM

## 2017-01-28 DIAGNOSIS — Z96651 Presence of right artificial knee joint: Secondary | ICD-10-CM | POA: Diagnosis not present

## 2017-01-28 MED ORDER — DOXYCYCLINE HYCLATE 100 MG PO TABS: 100.0000 mg | ORAL_TABLET | Freq: Two times a day (BID) | ORAL | 0 refills | Status: AC

## 2017-01-28 NOTE — Progress Notes (Signed)
Subjective:  Patient ID: [name], male    DOB: [birth year]  Age: 83 y.o. MRN: [id number]  CC: Follow-up (fall Friday--went to ER--both knee pain,cut on right hand/ knot on left hand--notice 2 wek ago??  tick bites?)   Loss of Consciousness  This is a new problem. The current episode started in the past 7 days (on Friday). Episode frequency: had similar episode 2015 (unknown cause then) The problem has been resolved. He lost consciousness for a period of less than 1 minute. Exacerbated by: standing in his kitchen. Associated symptoms include light-headedness. Pertinent negatives include no auditory change, aura, back pain, bladder incontinence, bowel incontinence, chest pain, clumsiness, confusion, diaphoresis, dizziness, fever, focal sensory loss, focal weakness, headaches, malaise/fatigue, nausea, palpitations, slurred speech, vertigo, visual change, vomiting or weakness. He has tried nothing for the symptoms. His past medical history is significant for CAD and HTN. There is no history of arrhythmia, a clotting disorder, CVA, DM, seizures, a sudden death in family, TIA or vertigo.  Knee Pain   The incident occurred 3 to 5 days ago. The incident occurred at home. The injury mechanism was a twisting injury. The pain is present in the right knee. The quality of the pain is described as aching. The pain has been constant since onset. Pertinent negatives include no loss of motion, loss of sensation, muscle weakness, numbness or tingling. The symptoms are aggravated by movement, weight bearing and palpation. He has tried elevation and rest for the symptoms. The treatment provided mild relief.  Rash  This is a new problem. The current episode started in the past 7 days. The problem has been gradually worsening since onset. The affected locations include the right lower leg. The rash is characterized by redness, swelling and pain. He was exposed to an insect bite/sting. Associated symptoms include joint  pain. Pertinent negatives include no anorexia, congestion, cough, diarrhea, eye pain, facial edema, fatigue, fever, nail changes, rhinorrhea, shortness of breath, sore throat or vomiting. Past treatments include anti-itch cream. The treatment provided mild relief. His past medical history is significant for varicella. There is no history of allergies, asthma or eczema.  BMP, CBC, ECG and troponin done in ED on Friday. He left ED prior to completion of evaluation. Reviewed lab result and recent ECG (NSR, no ST interval or T wave abnormality) BMP indicates possible dehydration.   Outpatient Medications Prior to Visit  Medication Sig Dispense Refill  . amLODipine (NORVASC) 5 MG tablet TAKE 1 TABLET (5 MG TOTAL) BY MOUTH DAILY. 90 tablet 1  . carvedilol (COREG) 25 MG tablet TAKE 1 TABLET BY MOUTH 2 TIMES DAILY WITH A MEAL 60 tablet 11  . finasteride (PROSCAR) 5 MG tablet TAKE 1 TABLET BY MOUTH EVERY DAY 30 tablet 3  . losartan (COZAAR) 50 MG tablet TAKE 1 TABLET (50 MG TOTAL) BY MOUTH DAILY. 30 tablet 6  . magnesium hydroxide (MILK OF MAGNESIA) 800 MG/5ML suspension Take 15 mLs by mouth every other day.    . Oxycodone HCl 10 MG TABS Take 10 mg by mouth 3 (three) times daily as needed (pain).   0  . predniSONE (DELTASONE) 10 MG tablet Take 1 tablet (10 mg total) by mouth daily with breakfast. Needs visit with labs for further refills 30 tablet 0  . tamsulosin (FLOMAX) 0.4 MG CAPS capsule TAKE 1 CAPSULE (0.4 MG TOTAL) BY MOUTH DAILY. 30 capsule 6  . torsemide (DEMADEX) 10 MG tablet Take 1 tablet (10 mg total) by mouth daily as needed.  30 tablet 0  . triamcinolone cream (KENALOG) 0.5 % USE AS DIRECTED 60 g 0  . vitamin B-12 (CYANOCOBALAMIN) 1000 MCG tablet Take 1 tablet (1,000 mcg total) by mouth daily. 90 tablet 3  . fluticasone (FLONASE) 50 MCG/ACT nasal spray PLACE 2 SPRAYS IN TO BOTH NOSTRILS DAILY (Patient not taking: Reported on 01/28/2017) 16 g 6   No facility-administered medications prior to  visit.     ROS See HPI  Objective:  BP (!) 100/52   Pulse 73   Temp 98.2 F (36.8 C)   Ht 6' (1.829 m)   Wt 210 lb (95.3 kg)   SpO2 94%   BMI 28.48 kg/m   BP Readings from Last 3 Encounters:  01/28/17 (!) 100/52  01/25/17 106/72  01/11/17 96/62    Wt Readings from Last 3 Encounters:  01/28/17 210 lb (95.3 kg)  01/25/17 212 lb (96.2 kg)  01/11/17 205 lb 6.4 oz (93.2 kg)    Physical Exam  Constitutional: He is oriented to person, place, and time.  HENT:  Mouth/Throat: No oropharyngeal exudate.  Eyes: No scleral icterus.  Neck: Normal range of motion. No JVD present.  No carotid prior  Cardiovascular: Normal rate and regular rhythm.   Pulmonary/Chest: Effort normal and breath sounds normal.  Abdominal: Soft. Bowel sounds are normal.  Musculoskeletal: He exhibits edema and tenderness. He exhibits no deformity.  Lymphadenopathy:    He has no cervical adenopathy.  Neurological: He is alert and oriented to person, place, and time.  Skin: Skin is warm and dry. Rash noted. There is erythema.  Vitals reviewed.   Lab Results  Component Value Date   WBC 12.9 (H) 01/25/2017   HGB 13.3 01/25/2017   HCT 41.2 01/25/2017   PLT 368 01/25/2017   GLUCOSE 110 (H) 01/25/2017   CHOL 166 06/08/2014   TRIG 89.0 06/08/2014   HDL 67.50 06/08/2014   LDLDIRECT 150.9 01/12/2009   LDLCALC 81 06/08/2014   ALT 23 08/16/2016   AST 18 08/16/2016   NA 136 01/25/2017   K 4.2 01/25/2017   CL 100 (L) 01/25/2017   CREATININE 1.46 (H) 01/25/2017   BUN 31 (H) 01/25/2017   CO2 26 01/25/2017   TSH 1.43 04/20/2016   PSA 1.02 08/12/2012   INR 2.24 (H) 11/27/2009    No results found.  Assessment & Plan:   [name] was seen today for follow-up.  Diagnoses and all orders for this visit:  Syncope and collapse -     Basic metabolic panel; Future -     VAS US CAROTID; Future -     Ambulatory referral to Neurology  Acute pain of right knee -     DG Knee Complete 4 Views Right;  Future  Tick bite of calf, right, initial encounter -     doxycycline (VIBRA-TABS) 100 MG tablet; Take 1 tablet (100 mg total) by mouth 2 (two) times daily.   I am having [name] start on doxycycline. I am also having him maintain his magnesium hydroxide, Oxycodone HCl, tamsulosin, vitamin B-12, fluticasone, amLODipine, triamcinolone cream, losartan, carvedilol, finasteride, predniSONE, and torsemide.  Meds ordered this encounter  Medications  . doxycycline (VIBRA-TABS) 100 MG tablet    Sig: Take 1 tablet (100 mg total) by mouth 2 (two) times daily.    Dispense:  14 tablet    Refill:  0    Order Specific Question:   Supervising Provider    Answer:   Cassandria Anger [[id number]]    Follow-up:  Return if symptoms worsen or fail to improve.  Wilfred Lacy, NP

## 2017-01-28 NOTE — Patient Instructions (Signed)
Go to basement for knee x-ray.  Encourage adequate oral hydration.  Return to lab in basement on Friday for repeat BMP.  You will be contacted to schedules carotid US.  You will be called with knee x-ray results.

## 2017-01-30 ENCOUNTER — Encounter: Payer: Self-pay | Admitting: Neurology

## 2017-02-01 ENCOUNTER — Other Ambulatory Visit (INDEPENDENT_AMBULATORY_CARE_PROVIDER_SITE_OTHER): Payer: Medicare Other

## 2017-02-01 DIAGNOSIS — R55 Syncope and collapse: Secondary | ICD-10-CM | POA: Diagnosis not present

## 2017-02-01 LAB — BASIC METABOLIC PANEL
BUN: 27 mg/dL — ABNORMAL HIGH (ref 6–23)
CO2: 33 mEq/L — ABNORMAL HIGH (ref 19–32)
Calcium: 9.3 mg/dL (ref 8.4–10.5)
Chloride: 104 mEq/L (ref 96–112)
Creatinine, Ser: 1.37 mg/dL (ref 0.40–1.50)
GFR: 52.68 mL/min — ABNORMAL LOW (ref >60.00)
Glucose, Bld: 126 mg/dL — ABNORMAL HIGH (ref 70–99)
Potassium: 5.1 mEq/L (ref 3.5–5.1)
Sodium: 143 mEq/L (ref 135–145)

## 2017-02-02 ENCOUNTER — Other Ambulatory Visit: Payer: Self-pay | Admitting: Internal Medicine

## 2017-02-04 ENCOUNTER — Encounter: Payer: Self-pay | Admitting: Nurse Practitioner

## 2017-02-04 NOTE — Telephone Encounter (Signed)
Please advise 

## 2017-02-12 ENCOUNTER — Ambulatory Visit (HOSPITAL_COMMUNITY)
Admission: RE | Admit: 2017-02-12 | Discharge: 2017-02-12 | Disposition: A | Discharge status: Home / Self Care | Payer: Medicare Other | Source: Ambulatory Visit | Attending: Cardiology | Admitting: Cardiology

## 2017-02-12 DIAGNOSIS — R55 Syncope and collapse: Secondary | ICD-10-CM | POA: Diagnosis not present

## 2017-02-18 ENCOUNTER — Other Ambulatory Visit: Payer: Self-pay | Admitting: Family

## 2017-02-27 ENCOUNTER — Encounter: Payer: Self-pay | Admitting: Nurse Practitioner

## 2017-02-27 ENCOUNTER — Other Ambulatory Visit: Payer: Self-pay | Admitting: Nurse Practitioner

## 2017-02-27 ENCOUNTER — Ambulatory Visit (INDEPENDENT_AMBULATORY_CARE_PROVIDER_SITE_OTHER): Payer: Medicare Other | Admitting: Neurology

## 2017-02-27 ENCOUNTER — Encounter: Payer: Self-pay | Admitting: Neurology

## 2017-02-27 VITALS — BP 140/70 | HR 75 | Ht 72.0 in | Wt 209.3 lb

## 2017-02-27 DIAGNOSIS — R55 Syncope and collapse: Secondary | ICD-10-CM

## 2017-02-27 NOTE — Telephone Encounter (Signed)
-----   Message from Cameron Sprang, MD sent at 02/27/2017  1:54 PM EDT ----- Regarding: mutual patient [place],  Thanks for the referral. I saw Mr. Waterworth today, the episodes are strongly suggestive of syncope, possibly vasovagal, but since he has had 2 in the past month, he should see Cardiology. With syncopal episodes, unless he has an abnormal neuro exam, neurological causes usually come secondary to cardiac, would rule out arrhythmia first. I discussed this with patient, but he is hesitant due to funds, please reiterate to patient.   Thanks, Santiago Glad

## 2017-02-27 NOTE — Progress Notes (Signed)
NEUROLOGY CONSULTATION NOTE  [name] MRN: [id number] DOB: 1932-03-26  Referring provider: Wilfred Lacy, NP Primary care provider: Dr. Pricilla Holm  Reason for consult:  syncope  Thank you for your kind referral of [name] for consultation of the above symptoms. Although his history is well known to you, please allow me to reiterate it for the purpose of our medical record. Records and images were personally reviewed where available.  HISTORY OF PRESENT ILLNESS: This is a pleasant 83 year old left-handed man with a history of hypertension, hyperlipidemia, BPH, chronic back pain, presenting for evaluation of recurrent syncope. The first episode occurred in 2014 or 2015, he recalls getting very dizzy, then "the lights went out" and he hit the floor so hard that it set off their security system. He woke up "in an instant." I cannot find any tests done for syncope at that time, he had an echocardiogram in May 2014 for dyspnea and edema, which showed normal EF 55-60%, moderately dilated left atrium. He had another syncopal episode the beginning of June 2018, he fell and the lights went out, injuring his shoulders. Most recent syncopal episode occurred 8-10 days later, on 01/25/17, he walked from the couch to the kitchen then turned and felt very dizzy where he tried to brace himself, then "lights went out" and he fell on the floor, banging both knees and causing an abrasion on the base of his right thumb. His wife heard the fall and came to him immediately, he was awake at that time telling her he needed some time to get up, no confusion. No tongue bite or incontinence. He reports he was alert right after he hit the floor. He denied any palpitations, chest pain, shortness of breath, no olfactory/gustatory hallucinations, deja vu, rising epigastric sensation, focal weakness, myoclonic jerks. He denies any recent changes to his medications. The syncopal episodes all occurred in the  evening hours, he had not taken his medications yet. He takes oxycodone for leg pain 10mg  TID.   He denies having any dizziness in general, except for those 3 times. He feels tired and weak when he exerts himself. He has occasional numbness in his left hand and arm. His left foot stays numb. He denies any headaches, diplopia, dysarthria/dysphagia, neck pain, bowel/bladder dysfunction. No family history of similar symptoms. He had a normal birth and early development.  There is no history of febrile convulsions, CNS infections such as meningitis/encephalitis, significant traumatic brain injury, neurosurgical procedures, or family history of seizures.   Diagnostic Data: Carotid dopplers done 02/12/17 showed heterogeneous plaque with 1-39% bilateral ICA stenosis, patent vertebral arteries.   PAST MEDICAL HISTORY: Past Medical History:  Diagnosis Date  . ACTINIC KERATOSIS, HEAD 03/23/2008  . ALLERGIC RHINITIS 03/27/2007  . Arthritis   . BENIGN PROSTATIC HYPERTROPHY, WITH URINARY OBSTRUCTION 10/16/2007  . CAD 08/18/2007   "patient denies any issues with heart, does not see cardiologist"  . Carpal tunnel syndrome, bilateral 09/04/2016  . CARPAL TUNNEL SYNDROME, LEFT 04/13/2008  . Chronic back pain   . Chronic neck pain   . DVT (deep venous thrombosis) ([place])   . ERECTILE DYSFUNCTION, ORGANIC 02/20/2010  . Gout, unspecified 02/20/2010  . Headache(784.0)    migraines hx of  . History of melanoma   . HYPERLIPIDEMIA, WITH HIGH HDL 10/18/2008  . HYPERTENSION 05/09/2007   sees Dr. Benay Pillow  . KNEE PAIN 10/10/2009  . LOW BACK PAIN 03/27/2007  . NEOPLASM, MALIGNANT, SKIN, TRUNK 05/09/2007  . Patellar  tendinitis 10/10/2009  . SKIN CANCER, HX OF 03/27/2007  . UNS ADVRS EFF UNS RX MEDICINAL&BIOLOGICAL SBSTNC 08/18/2007  . Urinary urgency     PAST SURGICAL HISTORY: Past Surgical History:  Procedure Laterality Date  . APPENDECTOMY  1969  . BACK SURGERY    . BLEPHAROPLASTY    . CARPAL TUNNEL RELEASE    .  CHOLECYSTECTOMY    . ESOPHAGOGASTRODUODENOSCOPY (EGD) WITH PROPOFOL N/A 04/26/2015   Procedure: ESOPHAGOGASTRODUODENOSCOPY (EGD) WITH PROPOFOL;  Surgeon: Inda Castle, MD;  Location: [facility];  Service: Endoscopy;  Laterality: N/A;  . EYE SURGERY     bilateral cataracts removed and vitrectomies  . JOINT REPLACEMENT Right    knee  . KNEE ARTHROSCOPY    . LAMINECTOMY    . LATERAL FUSION LUMBAR SPINE, TRANSVERSE    . LUMBAR LAMINECTOMY/DECOMPRESSION MICRODISCECTOMY Left 11/03/2012   Procedure: LUMBAR LAMINECTOMY/DECOMPRESSION MICRODISCECTOMY 1 LEVEL;  Surgeon: Hosie Spangle, MD;  Location: [facility];  Service: Neurosurgery;  Laterality: Left;  LEFT L5S1 laminotomy foraminotomy and possible microdiskectomy  . MELANOMA EXCISION    . NASAL SINUS SURGERY    . POSTERIOR CERVICAL FUSION/FORAMINOTOMY Right 10/01/2012   Procedure: POSTERIOR CERVICAL FUSION/FORAMINOTOMY LEVEL 1;  Surgeon: Hosie Spangle, MD;  Location: [facility];  Service: Neurosurgery;  Laterality: Right;  Right Cervical seven thoracic one Cervical laminectomy/foraminotomy with posterior cervical arthrodesis   . POSTERIOR LUMBAR FUSION N/A 05/23/2013   Procedure: exploration of lumbar wound, explantation of left interbody implant.;  Surgeon: Hosie Spangle, MD;  Location: [facility];  Service: Neurosurgery;  Laterality: N/A;  . RECTAL SURGERY     Fissure  . VITRECTOMY     right and left 2013    MEDICATIONS: Current Outpatient Prescriptions on File Prior to Visit  Medication Sig Dispense Refill  . amLODipine (NORVASC) 5 MG tablet TAKE 1 TABLET (5 MG TOTAL) BY MOUTH DAILY. 90 tablet 1  . carvedilol (COREG) 25 MG tablet TAKE 1 TABLET BY MOUTH 2 TIMES DAILY WITH A MEAL 60 tablet 11  . finasteride (PROSCAR) 5 MG tablet TAKE 1 TABLET BY MOUTH EVERY DAY 30 tablet 3  . fluticasone (FLONASE) 50 MCG/ACT nasal spray PLACE 2 SPRAYS IN TO BOTH NOSTRILS DAILY (Patient not taking: Reported on 01/28/2017) 16 g 6  . losartan  (COZAAR) 50 MG tablet TAKE 1 TABLET (50 MG TOTAL) BY MOUTH DAILY. 30 tablet 6  . magnesium hydroxide (MILK OF MAGNESIA) 800 MG/5ML suspension Take 15 mLs by mouth every other day.    . Oxycodone HCl 10 MG TABS Take 10 mg by mouth 3 (three) times daily as needed (pain).   0  . predniSONE (DELTASONE) 10 MG tablet TAKE 1 TABLET BY MOUTH DAILY WITH BREAKFAST 30 tablet 0  . tamsulosin (FLOMAX) 0.4 MG CAPS capsule TAKE 1 CAPSULE (0.4 MG TOTAL) BY MOUTH DAILY. 30 capsule 6  . torsemide (DEMADEX) 10 MG tablet TAKE 1 TABLET (10 MG TOTAL) BY MOUTH DAILY AS NEEDED. 30 tablet 0  . triamcinolone cream (KENALOG) 0.5 % USE AS DIRECTED 60 g 0  . vitamin B-12 (CYANOCOBALAMIN) 1000 MCG tablet Take 1 tablet (1,000 mcg total) by mouth daily. 90 tablet 3   No current facility-administered medications on file prior to visit.     ALLERGIES: Allergies  Allergen Reactions  . Gabapentin     Lower extremity edema  . Lyrica [Pregabalin] Swelling    FAMILY HISTORY: Family History  Problem Relation Age of Onset  . Dementia Mother   .  Anemia Father   . Heart disease Father   . Stroke Sister        ICH  . Lung disease Sister     SOCIAL HISTORY: Social History   Social History  . Marital status: Married    Spouse name: N/A  . Number of children: 3  . Years of education: 12   Occupational History  . Retired    Social History Main Topics  . Smoking status: Former Smoker    Packs/day: 1.00    Years: 33.00    Quit date: 09/29/1981  . Smokeless tobacco: Never Used  . Alcohol use 1.8 oz/week    3 Standard drinks or equivalent per week     Comment: Social  . Drug use: No  . Sexual activity: Yes    Partners: Female     Comment: Married   Other Topics Concern  . Not on file   Social History Narrative   Lives at home w/ his wife   Left-handed   Caffeine: 1 cup coffee daily    REVIEW OF SYSTEMS: Constitutional: No fevers, chills, or sweats, no generalized fatigue, change in appetite Eyes: No  visual changes, double vision, eye pain Ear, nose and throat: No hearing loss, ear pain, nasal congestion, sore throat Cardiovascular: No chest pain, palpitations Respiratory:  No shortness of breath at rest or with exertion, wheezes GastrointestinaI: No nausea, vomiting, diarrhea, abdominal pain, fecal incontinence Genitourinary:  No dysuria, urinary retention or frequency Musculoskeletal:  No neck pain, +back pain Integumentary: No rash, pruritus, skin lesions Neurological: as above Psychiatric: No depression, insomnia, anxiety Endocrine: No palpitations, fatigue, diaphoresis, mood swings, change in appetite, change in weight, increased thirst Hematologic/Lymphatic:  No anemia, purpura, petechiae. Allergic/Immunologic: no itchy/runny eyes, nasal congestion, recent allergic reactions, rashes  PHYSICAL EXAM: Vitals:   02/27/17 1256  BP: 140/70  Pulse: 75   Orthostatic VS for the past 24 hrs:  BP- Lying Pulse- Lying BP- Sitting Pulse- Sitting BP- Standing at 0 minutes Pulse- Standing at 0 minutes  02/27/17 1337 122/58 70 126/62 72 142/64 85   General: No acute distress Head:  Normocephalic/atraumatic Eyes: Fundoscopic exam shows bilateral sharp discs, no vessel changes, exudates, or hemorrhages Neck: supple, no paraspinal tenderness, full range of motion Back: No paraspinal tenderness Heart: regular rate and rhythm Lungs: Clear to auscultation bilaterally. Vascular: No carotid bruits. Skin/Extremities: No rash, no edema Neurological Exam: Mental status: alert and oriented to person, place, and time, no dysarthria or aphasia, Fund of knowledge is appropriate.  Recent and remote memory are intact.  Attention and concentration are normal.    Able to name objects and repeat phrases. Cranial nerves: CN I: not tested CN II: pupils equal, round and reactive to light, visual fields intact, fundi unremarkable. CN III, IV, VI:  full range of motion, no nystagmus, no ptosis CN V: facial  sensation intact CN VII: upper and lower face symmetric CN VIII: hearing intact to finger rub CN IX, X: gag intact, uvula midline CN XI: sternocleidomastoid and trapezius muscles intact CN XII: tongue midline Bulk & Tone: normal, no fasciculations. Motor: 5/5 throughout with no pronator drift. Sensation: intact to light touch, cold, pin on both UE, decreased cold on right outer calf (chronic since knee surgery), decreased pin on left foot.  No extinction to double simultaneous stimulation.  Romberg test negative Deep Tendon Reflexes: +1 throughout, no ankle clonus Plantar responses: downgoing bilaterally Cerebellar: no incoordination on finger to nose, heel to shin. No dysdiadochokinesia Gait: narrow-based  and steady, refused to tandem walk Tremor: none  IMPRESSION: This is a pleasant 83 year old left-handed man with a history of hypertension, hyperlipidemia, BPH, chronic back pain, presenting for evaluation of 3 episodes of syncope, he had one in 2014 or 2015, then 2 in the past month or so. His neurological exam is largely non-focal except for left foot sensory changes that are chronic per patient. Symptoms suggestive of syncope, unlikely seizure. He reports the syncopal episodes are preceded by significant dizziness, then he loses consciousness for a very brief period, no post-event confusion. We discussed that with syncope, foremost to rule out is any arrhythmia, recommend Cardiology evaluation. He would like to think about this and will discuss with his PCP. No evidence of orthostatic hypotension on visit today. [place] driving laws were discussed with the patient, and he knows to stop driving after an episode of loss of consciousness, until 6 months event-free.He will follow-up on a prn basis and knows to call for any changes.   Thank you for allowing me to participate in the care of this patient. Please do not hesitate to call for any questions or concerns.   Ellouise Newer, M.D.  CC: Dr.  Sharlet Salina, Wilfred Lacy, NP

## 2017-02-27 NOTE — Patient Instructions (Signed)
1. Recommend Cardiology evaluation 2. As per [place] driving laws, if one passes out from any reason, they should not drive until 6 months event-free 3. Follow-up on as needed basis, call for any changes

## 2017-02-28 NOTE — Telephone Encounter (Signed)
Do you want me to cancel the cardiology referral as pt request? Please advise.

## 2017-03-12 ENCOUNTER — Other Ambulatory Visit: Payer: Self-pay | Admitting: Family

## 2017-03-16 ENCOUNTER — Other Ambulatory Visit: Payer: Self-pay | Admitting: Internal Medicine

## 2017-03-16 ENCOUNTER — Encounter: Payer: Self-pay | Admitting: Family Medicine

## 2017-03-17 ENCOUNTER — Other Ambulatory Visit: Payer: Self-pay | Admitting: Family

## 2017-03-19 NOTE — Telephone Encounter (Signed)
Routing to [name], you saw patient for acute visit recently---are you ok with refill until dr crawford returns for patient to have wellness visit, or do you want office visit now---please advise, thanks

## 2017-03-20 ENCOUNTER — Other Ambulatory Visit: Payer: Self-pay | Admitting: Internal Medicine

## 2017-03-25 ENCOUNTER — Other Ambulatory Visit: Payer: Self-pay | Admitting: Internal Medicine

## 2017-03-31 ENCOUNTER — Other Ambulatory Visit: Payer: Self-pay | Admitting: Family

## 2017-04-16 ENCOUNTER — Encounter: Payer: Self-pay | Admitting: Internal Medicine

## 2017-04-16 DIAGNOSIS — I10 Essential (primary) hypertension: Secondary | ICD-10-CM | POA: Diagnosis not present

## 2017-04-16 DIAGNOSIS — M4726 Other spondylosis with radiculopathy, lumbar region: Secondary | ICD-10-CM | POA: Diagnosis not present

## 2017-04-16 DIAGNOSIS — Z981 Arthrodesis status: Secondary | ICD-10-CM | POA: Diagnosis not present

## 2017-04-16 DIAGNOSIS — M5136 Other intervertebral disc degeneration, lumbar region: Secondary | ICD-10-CM | POA: Diagnosis not present

## 2017-04-16 DIAGNOSIS — M5416 Radiculopathy, lumbar region: Secondary | ICD-10-CM | POA: Diagnosis not present

## 2017-04-16 DIAGNOSIS — Z6828 Body mass index (BMI) 28.0-28.9, adult: Secondary | ICD-10-CM | POA: Diagnosis not present

## 2017-04-18 ENCOUNTER — Encounter: Payer: Self-pay | Admitting: Internal Medicine

## 2017-04-18 ENCOUNTER — Other Ambulatory Visit (INDEPENDENT_AMBULATORY_CARE_PROVIDER_SITE_OTHER): Payer: Medicare Other

## 2017-04-18 ENCOUNTER — Ambulatory Visit (INDEPENDENT_AMBULATORY_CARE_PROVIDER_SITE_OTHER): Payer: Medicare Other | Admitting: Internal Medicine

## 2017-04-18 ENCOUNTER — Other Ambulatory Visit: Payer: Self-pay | Admitting: Internal Medicine

## 2017-04-18 VITALS — BP 150/80 | HR 70 | Temp 98.3°F | Ht 72.0 in | Wt 209.0 lb

## 2017-04-18 DIAGNOSIS — R0600 Dyspnea, unspecified: Secondary | ICD-10-CM

## 2017-04-18 DIAGNOSIS — Z23 Encounter for immunization: Secondary | ICD-10-CM

## 2017-04-18 DIAGNOSIS — M7989 Other specified soft tissue disorders: Secondary | ICD-10-CM | POA: Diagnosis not present

## 2017-04-18 DIAGNOSIS — I10 Essential (primary) hypertension: Secondary | ICD-10-CM | POA: Diagnosis not present

## 2017-04-18 LAB — COMPREHENSIVE METABOLIC PANEL
ALT: 11 U/L (ref 0–53)
AST: 16 U/L (ref 0–37)
Albumin: 3.6 g/dL (ref 3.5–5.2)
Alkaline Phosphatase: 81 U/L (ref 39–117)
BUN: 14 mg/dL (ref 6–23)
CO2: 28 mEq/L (ref 19–32)
Calcium: 9.3 mg/dL (ref 8.4–10.5)
Chloride: 107 mEq/L (ref 96–112)
Creatinine, Ser: 0.94 mg/dL (ref 0.40–1.50)
GFR: 81.33 mL/min (ref >60.00)
Glucose, Bld: 111 mg/dL — ABNORMAL HIGH (ref 70–99)
Potassium: 4.4 mEq/L (ref 3.5–5.1)
Sodium: 140 mEq/L (ref 135–145)
Total Bilirubin: 0.6 mg/dL (ref 0.2–1.2)
Total Protein: 6.3 g/dL (ref 6.0–8.3)

## 2017-04-18 LAB — BRAIN NATRIURETIC PEPTIDE: Pro B Natriuretic peptide (BNP): 370 pg/mL — ABNORMAL HIGH (ref 0.0–100.0)

## 2017-04-18 MED ORDER — LOSARTAN POTASSIUM 100 MG PO TABS: 100.0000 mg | ORAL_TABLET | Freq: Every day | ORAL | 3 refills | Status: DC

## 2017-04-18 NOTE — Assessment & Plan Note (Signed)
Checking BNP and CMP to rule out additional causes. Stop torsemide since not effective. Stop amlodipine as this can cause swelling. Increase losartan to help with BP.

## 2017-04-18 NOTE — Patient Instructions (Signed)
We have stopped the amlodipine.   We have increased the dose of the losartan to 100 mg daily. Take 2 pills daily until yours are gone. When you refill it will be stronger so just take 1 pill daily.

## 2017-04-18 NOTE — Progress Notes (Signed)
   Subjective:    Patient ID: [name], male    DOB: 1932/06/07, 83 y.o.   MRN: [id number]  HPI The patient is an 83 YO man coming in for fluid in his legs and ankles. It starts throughout the day and gets better in the morning. Going on for a couple of weeks. It is not painful and no rash on the skin. Denies recent long travel. Both legs are about the same. No change in his medications. Tried taking torsemide 10 mg which did not help and then increased to 20 mg daily and this did not help. He was urinating a bunch but did not notice the fluid to decrease a lot. No change in diet and denies excessive salt intake. Stays hydrated as well as he can. Feels that he sweats a lot and so can lose water this way. Has not had or taken prednisone recently.   Review of Systems  Constitutional: Negative.   Respiratory: Negative for cough, chest tightness and shortness of breath.   Cardiovascular: Positive for leg swelling. Negative for chest pain and palpitations.  Gastrointestinal: Negative for abdominal distention, abdominal pain, constipation, diarrhea, nausea and vomiting.  Musculoskeletal: Positive for arthralgias. Negative for back pain, gait problem and joint swelling.  Skin: Negative.   Neurological: Negative.   Psychiatric/Behavioral: Negative.       Objective:   Physical Exam  Constitutional: He is oriented to person, place, and time. He appears well-developed and well-nourished.  HENT:  Head: Normocephalic and atraumatic.  Eyes: EOM are normal.  Neck: Normal range of motion.  Cardiovascular: Normal rate and regular rhythm.   Pulmonary/Chest: Effort normal and breath sounds normal. No respiratory distress. He has no wheezes. He has no rales.  Abdominal: Soft. Bowel sounds are normal. He exhibits no distension. There is no tenderness. There is no rebound.  Musculoskeletal: He exhibits edema.  1+ pitting edema to mid shin bilaterally, no skin rash or calf tenderness  Neurological: He is  alert and oriented to person, place, and time. Coordination normal.  Skin: Skin is warm and dry.  Psychiatric: He has a normal mood and affect.   Vitals:   04/18/17 1002  BP: (!) 150/80  Pulse: 70  Temp: 98.3 F (36.8 C)  TempSrc: Oral  SpO2: 98%  Weight: 209 lb (94.8 kg)  Height: 6' (1.829 m)      Assessment & Plan:  Flu shot given at visit

## 2017-04-18 NOTE — Assessment & Plan Note (Signed)
Stop amlodipine for possible side effect of swelling. Increase losartan to 100 mg daily.

## 2017-04-30 ENCOUNTER — Encounter: Payer: Self-pay | Admitting: Internal Medicine

## 2017-05-09 ENCOUNTER — Encounter: Payer: Self-pay | Admitting: Internal Medicine

## 2017-05-16 ENCOUNTER — Other Ambulatory Visit: Payer: Self-pay | Admitting: Family

## 2017-05-19 ENCOUNTER — Encounter: Payer: Self-pay | Admitting: Family Medicine

## 2017-05-20 ENCOUNTER — Other Ambulatory Visit: Payer: Self-pay

## 2017-05-20 ENCOUNTER — Ambulatory Visit (HOSPITAL_COMMUNITY): Payer: Medicare Other | Attending: Internal Medicine

## 2017-05-20 DIAGNOSIS — I081 Rheumatic disorders of both mitral and tricuspid valves: Secondary | ICD-10-CM | POA: Diagnosis not present

## 2017-05-20 DIAGNOSIS — R609 Edema, unspecified: Secondary | ICD-10-CM | POA: Insufficient documentation

## 2017-05-20 DIAGNOSIS — R0600 Dyspnea, unspecified: Secondary | ICD-10-CM | POA: Diagnosis not present

## 2017-05-21 ENCOUNTER — Encounter: Payer: Self-pay | Admitting: Sports Medicine

## 2017-05-21 ENCOUNTER — Ambulatory Visit: Payer: Self-pay

## 2017-05-21 ENCOUNTER — Ambulatory Visit (INDEPENDENT_AMBULATORY_CARE_PROVIDER_SITE_OTHER): Payer: Medicare Other | Admitting: Sports Medicine

## 2017-05-21 VITALS — BP 170/92 | HR 78 | Ht 72.0 in | Wt 208.6 lb

## 2017-05-21 DIAGNOSIS — M1712 Unilateral primary osteoarthritis, left knee: Secondary | ICD-10-CM

## 2017-05-21 NOTE — Progress Notes (Signed)
OFFICE VISIT NOTE [name] [name], [place] at [facility] - 83 y.o. male MRN [id number]  Date of birth: [birth year]  Visit Date: 05/21/2017  PCP: Hoyt Koch, MD   Referred by: Gayla Doss, CMA acting as scribe for Dr. [name].  SUBJECTIVE:   Chief Complaint  Patient presents with  . Follow-up    LT knee pain   HPI: As below and per problem based documentation when appropriate.  [name] is an established patient presenting today in follow-up of osteoarthritis of the LT knee. He was last seen 01/11/17 and received DepoMedrol steroid injection.  Pt got relief from steroid injection after last in June. Pain started again early Sept 2018 but has gotten progressively worse over the past 1-2 weeks. He had RT knee replacement in 2011 but is unable to have surgery on the LT knee at this time d/t surgery. He has discussed Visco supplementation with Dr. Tamala Julian in the past but was he says that Dr. Tamala Julian didn't recommend the injection for him. He has been taking Oxycodone d/t low back pain with left sided sciatica. He see's Dr. Durene Cal for this. He had back surgery in 2014 which if when the back pain started. He has noticed some swelling around the knee. Saturday and Sunday he did use compression on the knee and got short term relief with this. Pain is mostly on the medial aspect of the knee.     Review of Systems  Constitutional: Negative for chills, fever and malaise/fatigue.  Respiratory: Negative for shortness of breath and wheezing.   Cardiovascular: Negative for chest pain and palpitations.  Musculoskeletal: Positive for joint pain.  Neurological: Positive for tingling. Negative for dizziness, weakness and headaches.    Otherwise per HPI.  HISTORY & PERTINENT PRIOR DATA:  No specialty comments available. He reports that he quit smoking about 35 years ago. He has a 33.00  pack-year smoking history. He has never used smokeless tobacco. No results for input(s): HGBA1C, LABURIC in the last 8760 hours. Allergies reviewed per EMR Prior to Admission medications   Medication Sig Start Date End Date Taking? Authorizing Provider  carvedilol (COREG) 25 MG tablet TAKE 1 TABLET BY MOUTH 2 TIMES DAILY WITH A MEAL 10/26/16  Yes Hoyt Koch, MD  finasteride (PROSCAR) 5 MG tablet TAKE 1 TABLET BY MOUTH EVERY DAY 03/25/17  Yes Nche, Charlene Brooke, NP  losartan (COZAAR) 100 MG tablet Take 1 tablet (100 mg total) by mouth daily. 04/18/17  Yes Hoyt Koch, MD  magnesium hydroxide (MILK OF MAGNESIA) 800 MG/5ML suspension Take 15 mLs by mouth every other day.   Yes [provider]  Oxycodone HCl 10 MG TABS Take 10 mg by mouth 3 (three) times daily as needed (pain).  07/14/14  Yes [provider]  predniSONE (DELTASONE) 10 MG tablet TAKE 1 TABLET BY MOUTH EVERY DAY WITH BREAKFAST 05/16/17  Yes Hoyt Koch, MD  tamsulosin (FLOMAX) 0.4 MG CAPS capsule TAKE 1 CAPSULE (0.4 MG TOTAL) BY MOUTH DAILY. 04/10/16  Yes Hoyt Koch, MD  torsemide (DEMADEX) 10 MG tablet TAKE 1 TABLET (10 MG TOTAL) BY MOUTH DAILY AS NEEDED. 03/18/17  Yes Golden Circle, FNP  triamcinolone cream (KENALOG) 0.5 % USE AS DIRECTED 09/03/16  Yes Hoyt Koch, MD  vitamin B-12 (CYANOCOBALAMIN) 1000 MCG tablet Take 1 tablet (1,000 mcg total) by mouth daily. 06/19/16  Yes Crawford,  Real Cons, MD   Patient Active Problem List   Diagnosis Date Noted  . Syncope 02/27/2017  . Arthritis of right ankle 12/18/2016  . Cough 09/11/2016  . Carpal tunnel syndrome, bilateral 09/04/2016  . Leg swelling 08/16/2016  . Hand weakness 07/12/2016  . Degenerative arthritis of left knee 06/28/2016  . Iron deficiency anemia 03/16/2016  . Arthritis of left acromioclavicular joint 01/03/2016  . Rotator cuff disorder 01/03/2016  . Arthritis of left lower extremity 04/30/2015  .  Baker's cyst of knee 04/30/2015  . Esophageal stricture 04/26/2015  . BPH (benign prostatic hypertrophy) 02/21/2015  . Constipation 02/21/2015  . Myalgia and myositis 11/18/2014  . Polyarticular arthritis 07/19/2013  . Chronic radicular low back pain 12/15/2012  . History of gout 02/20/2010  . ERECTILE DYSFUNCTION, ORGANIC 02/20/2010  . HYPERLIPIDEMIA, WITH HIGH HDL 10/18/2008  . CAD 08/18/2007  . Essential hypertension 05/09/2007  . ALLERGIC RHINITIS 03/27/2007   Past Medical History:  Diagnosis Date  . ACTINIC KERATOSIS, HEAD 03/23/2008  . ALLERGIC RHINITIS 03/27/2007  . Arthritis   . BENIGN PROSTATIC HYPERTROPHY, WITH URINARY OBSTRUCTION 10/16/2007  . CAD 08/18/2007   "patient denies any issues with heart, does not see cardiologist"  . Carpal tunnel syndrome, bilateral 09/04/2016  . CARPAL TUNNEL SYNDROME, LEFT 04/13/2008  . Chronic back pain   . Chronic neck pain   . DVT (deep venous thrombosis) ([place])   . ERECTILE DYSFUNCTION, ORGANIC 02/20/2010  . Gout, unspecified 02/20/2010  . Headache(784.0)    migraines hx of  . History of melanoma   . HYPERLIPIDEMIA, WITH HIGH HDL 10/18/2008  . HYPERTENSION 05/09/2007   sees Dr. Benay Pillow  . KNEE PAIN 10/10/2009  . LOW BACK PAIN 03/27/2007  . NEOPLASM, MALIGNANT, SKIN, TRUNK 05/09/2007  . Patellar tendinitis 10/10/2009  . SKIN CANCER, HX OF 03/27/2007  . UNS ADVRS EFF UNS RX MEDICINAL&BIOLOGICAL SBSTNC 08/18/2007  . Urinary urgency    Family History  Problem Relation Age of Onset  . Dementia Mother   . Anemia Father   . Heart disease Father   . Stroke Sister        ICH  . Lung disease Sister    Past Surgical History:  Procedure Laterality Date  . APPENDECTOMY  1969  . BACK SURGERY    . BLEPHAROPLASTY    . CARPAL TUNNEL RELEASE    . CHOLECYSTECTOMY    . ESOPHAGOGASTRODUODENOSCOPY (EGD) WITH PROPOFOL N/A 04/26/2015   Procedure: ESOPHAGOGASTRODUODENOSCOPY (EGD) WITH PROPOFOL;  Surgeon: Inda Castle, MD;  Location: [facility];   Service: Endoscopy;  Laterality: N/A;  . EYE SURGERY     bilateral cataracts removed and vitrectomies  . JOINT REPLACEMENT Right    knee  . KNEE ARTHROSCOPY    . LAMINECTOMY    . LATERAL FUSION LUMBAR SPINE, TRANSVERSE    . LUMBAR LAMINECTOMY/DECOMPRESSION MICRODISCECTOMY Left 11/03/2012   Procedure: LUMBAR LAMINECTOMY/DECOMPRESSION MICRODISCECTOMY 1 LEVEL;  Surgeon: Hosie Spangle, MD;  Location: [place];  Service: Neurosurgery;  Laterality: Left;  LEFT L5S1 laminotomy foraminotomy and possible microdiskectomy  . MELANOMA EXCISION    . NASAL SINUS SURGERY    . POSTERIOR CERVICAL FUSION/FORAMINOTOMY Right 10/01/2012   Procedure: POSTERIOR CERVICAL FUSION/FORAMINOTOMY LEVEL 1;  Surgeon: Hosie Spangle, MD;  Location: [facility];  Service: Neurosurgery;  Laterality: Right;  Right Cervical seven thoracic one Cervical laminectomy/foraminotomy with posterior cervical arthrodesis   . POSTERIOR LUMBAR FUSION N/A 05/23/2013   Procedure: exploration of lumbar wound, explantation of left interbody implant.;  Surgeon: Hosie Spangle, MD;  Location: [facility];  Service: Neurosurgery;  Laterality: N/A;  . RECTAL SURGERY     Fissure  . VITRECTOMY     right and left 2013   Social History   Occupational History  . Retired    Social History Main Topics  . Smoking status: Former Smoker    Packs/day: 1.00    Years: 33.00    Quit date: 09/29/1981  . Smokeless tobacco: Never Used  . Alcohol use 1.8 oz/week    3 Standard drinks or equivalent per week     Comment: Social  . Drug use: No  . Sexual activity: Yes    Partners: Female     Comment: Married    OBJECTIVE:  VS:  HT:6' (182.9 cm)   WT:208 lb 9.6 oz (94.6 kg)  BMI:28.28    BP:(!) 170/92  HR:78bpm  TEMP: ( )  RESP:91 % EXAM: Findings:  Left knee is slightly swollen with generalized synovitis and small effusion.  He does have a small to moderate sized Baker's cyst that is decompressed and  Nontender.  Ligamentously stable to  varus and valgus strain.  Extensor mechanism intact.  1+ pitting edema in his left lower extremity.    RADIOLOGY: 05/05/14 -left knee: Tricompartmental degenerative changes ASSESSMENT & PLAN:     ICD-10-CM   1. Primary osteoarthritis of left knee M17.12 [place] LIMITED JOINT SPACE STRUCTURES LOW LEFT(NO LINKED CHARGES)   ================================================================= Degenerative arthritis of left knee Recurrent effusion over the weekend that has subsequently slightly improved.  Today and have him follow-up in 10 weeks for repeat injection and consideration of Visco supplementation once again but it does appears that this is been previously explored and was less than effective.  He does understand that a total knee arthroplasty is likely his only intervention that will provide lasting meaningful relief.  PROCEDURE NOTE - ULTRASOUND GUIDED ASPIRATION & INJECTION: Left Kne Images were obtained and interpreted by myself, Teresa Coombs, DO  Images have been saved and stored to PACS system. Images obtained on: GE S7 Ultrasound machine  ULTRASOUND FINDINGS: Generalized synovitis with small to moderate effusion, small Baker's cyst that is decompressed  DESCRIPTION OF PROCEDURE:  The patient's clinical condition is marked by substantial pain and/or significant functional disability. Other conservative therapy has not provided relief, is contraindicated, or not appropriate. There is a reasonable likelihood that injection will significantly improve the patient's pain and/or functional impairment. After discussing the risks, benefits and expected outcomes of the injection and all questions were reviewed and answered, the patient wished to undergo the above named procedure. Verbal consent was obtained. The ultrasound was used to identify the target structure and adjacent neurovascular structures. The skin was then prepped in sterile fashion and the target structure was injected under  direct visualization using sterile technique as below: PREP: Alcohol, Ethel Chloride, 3cc 1% lidocaine on 25 needle APPROACH: Superiolateral, stopcock technique, 18g 1.5" needle INJECTATE: 2 cc 0.5% marcaine, 2 cc 40mg  DepoMedrol ASPIRATE: 15cc of orange tinged synovial fluid DRESSING: Band-Aid and 6" Ace-Wrap  Post procedural instructions including recommending icing and warning signs for infection were reviewed. This procedure was well tolerated and there were no complications.   IMPRESSION: Succesful US Guided Aspiration & injection  ================================================================= Patient Instructions  You had an injection today.  Things to be aware of after injection are listed below: . You may experience no significant improvement or even a slight worsening in your symptoms during the first 24 to 48 hours.  After that we expect your symptoms to improve gradually over the next 2 weeks for the medicine to have its maximal effect.  You should continue to have improvement out to 6 weeks after your injection. . Dr. [name] recommends icing the site of the injection for 20 minutes  1-2 times the day of your injection . You may shower but no swimming, tub bath or Jacuzzi for 24 hours. . If your bandage falls off this does not need to be replaced.  It is appropriate to remove the bandage after 4 hours. . You may resume light activities as tolerated unless otherwise directed per Dr. [name] during your visit  POSSIBLE STEROID SIDE EFFECTS:  Side effects from injectable steroids tend to be less than when taken orally however you may experience some of the symptoms listed below.  If experienced these should only last for a short period of time. Change in menstrual flow  Edema (swelling)  Increased appetite Skin flushing (redness)  Skin rash/acne  Thrush (oral) Yeast vaginitis    Increased sweating  Depression Increased blood glucose levels Cramping and leg/calf  Euphoria (feeling  happy)  POSSIBLE PROCEDURE SIDE EFFECTS: The side effects of the injection are usually fairly minimal however if you may experience some of the following side effects that are usually self-limited and will is off on their own.  If you are concerned please feel free to call the office with questions:  Increased numbness or tingling  Nausea or vomiting  Swelling or bruising at the injection site   Please call our office if if you experience any of the following symptoms over the next week as these can be signs of infection:   Fever greater than 100.66F  Significant swelling at the injection site  Significant redness or drainage from the injection site  If after 2 weeks you are continuing to have worsening symptoms please call our office to discuss what the next appropriate actions should be including the potential for a return office visit or other diagnostic testing.    ================================================================= No future appointments.  Follow-up: Return in about 10 weeks (around 07/30/2017).   CMA/ATC served as Education administrator during this visit. History, Physical, and Plan performed by medical provider. Documentation and orders reviewed and attested to.      Teresa Coombs, [place] [facility] Physician

## 2017-05-21 NOTE — Patient Instructions (Signed)

## 2017-05-21 NOTE — Assessment & Plan Note (Signed)
Recurrent effusion over the weekend that has subsequently slightly improved.  Today and have him follow-up in 10 weeks for repeat injection and consideration of Visco supplementation once again but it does appears that this is been previously explored and was less than effective.  He does understand that a total knee arthroplasty is likely his only intervention that will provide lasting meaningful relief.

## 2017-05-21 NOTE — Procedures (Signed)
PROCEDURE NOTE - ULTRASOUND GUIDED ASPIRATION & INJECTION: Left Kne Images were obtained and interpreted by myself, Teresa Coombs, DO  Images have been saved and stored to PACS system. Images obtained on: GE S7 Ultrasound machine  ULTRASOUND FINDINGS: Generalized synovitis with small to moderate effusion, small Baker's cyst that is decompressed  DESCRIPTION OF PROCEDURE:  The patient's clinical condition is marked by substantial pain and/or significant functional disability. Other conservative therapy has not provided relief, is contraindicated, or not appropriate. There is a reasonable likelihood that injection will significantly improve the patient's pain and/or functional impairment. After discussing the risks, benefits and expected outcomes of the injection and all questions were reviewed and answered, the patient wished to undergo the above named procedure. Verbal consent was obtained. The ultrasound was used to identify the target structure and adjacent neurovascular structures. The skin was then prepped in sterile fashion and the target structure was injected under direct visualization using sterile technique as below: PREP: Alcohol, Ethel Chloride, 3cc 1% lidocaine on 25 needle APPROACH: Superiolateral, stopcock technique, 18g 1.5" needle INJECTATE: 2 cc 0.5% marcaine, 2 cc 40mg  DepoMedrol ASPIRATE: 15cc of orange tinged synovial fluid DRESSING: Band-Aid and 6" Ace-Wrap  Post procedural instructions including recommending icing and warning signs for infection were reviewed. This procedure was well tolerated and there were no complications.   IMPRESSION: Succesful US Guided Aspiration & injection

## 2017-06-04 ENCOUNTER — Other Ambulatory Visit: Payer: Self-pay | Admitting: Internal Medicine

## 2017-06-08 ENCOUNTER — Ambulatory Visit (INDEPENDENT_AMBULATORY_CARE_PROVIDER_SITE_OTHER): Payer: Medicare Other | Admitting: Family Medicine

## 2017-06-08 VITALS — BP 162/90 | HR 85 | Temp 98.4°F | Resp 16 | Wt 210.0 lb

## 2017-06-08 DIAGNOSIS — R52 Pain, unspecified: Secondary | ICD-10-CM

## 2017-06-08 DIAGNOSIS — J111 Influenza due to unidentified influenza virus with other respiratory manifestations: Secondary | ICD-10-CM

## 2017-06-08 LAB — POCT INFLUENZA A/B
Influenza A, POC: NEGATIVE
Influenza B, POC: NEGATIVE

## 2017-06-08 MED ORDER — OSELTAMIVIR PHOSPHATE 75 MG PO CAPS: 75.0000 mg | ORAL_CAPSULE | Freq: Two times a day (BID) | ORAL | 0 refills | Status: DC

## 2017-06-08 NOTE — Assessment & Plan Note (Signed)
Symptom onset concerning for influenza. Negative rapid flu test though given age and recently being in the hospital with his wife we will cover with Tamiflu for influenza given history. Renal function should tolerate 75 mg dose. Discussed staying hydrated. Follow-up if not improving. Given return precautions. Of note patient's wife is also a patient of Dr. Sharlet Salina and has risk factors of age and diabetes for poor outcome if she were to contract influenza. We'll treat her with prophylactic dosing of Tamiflu for influenza. Please see her chart for documentation and dosing.

## 2017-06-08 NOTE — Patient Instructions (Signed)
Nice to meet you. It sounds as though you may have the flu despite your flu test being negative. We will treat with Tamiflu. If you're symptoms worsen or you develop shortness of breath, persistent fevers, cough productive of blood, or any new or changing symptoms please seek medical attention.  We'll send an prophylactic dosing for your wife is well. If she develops any symptoms she should be evaluated.

## 2017-06-08 NOTE — Progress Notes (Signed)
  Tommi Rumps, MD Phone: [phone]  [name] is a 83 y.o. male who presents today for same-day visit.  Patient notes all of a sudden yesterday he developed sore throat. Subsequently developed body aches and joint aches. He notes some postnasal drip. No significant sinus or nasal congestion. No rhinorrhea. No cough. No fevers. No shortness of breath. He has been in and out of the hospital with his wife who recently had surgery.   ROS see history of present illness  Objective  Physical Exam Vitals:   06/08/17 1210  BP: (!) 162/90  Pulse: 85  Resp: 16  Temp: 98.4 F (36.9 C)  SpO2: 96%    BP Readings from Last 3 Encounters:  06/08/17 (!) 162/90  05/21/17 (!) 170/92  04/18/17 (!) 150/80   Wt Readings from Last 3 Encounters:  06/08/17 210 lb (95.3 kg)  05/21/17 208 lb 9.6 oz (94.6 kg)  04/18/17 209 lb (94.8 kg)    Physical Exam  Constitutional: No distress.  HENT:  Head: Normocephalic and atraumatic.  Moderate posterior oropharyngeal erythema with no exudate or swelling, normal TMs  Eyes: Pupils are equal, round, and reactive to light. Conjunctivae are normal.  Neck: Neck supple.  Cardiovascular: Normal rate, regular rhythm and normal heart sounds.   Pulmonary/Chest: Effort normal and breath sounds normal.  Lymphadenopathy:    He has no cervical adenopathy.  Skin: He is not diaphoretic.     Assessment/Plan: Please see individual problem list.  Influenza Symptom onset concerning for influenza. Negative rapid flu test though given age and recently being in the hospital with his wife we will cover with Tamiflu for influenza given history. Renal function should tolerate 75 mg dose. Discussed staying hydrated. Follow-up if not improving. Given return precautions. Of note patient's wife is also a patient of Dr. Sharlet Salina and has risk factors of age and diabetes for poor outcome if she were to contract influenza. We'll treat her with prophylactic dosing of Tamiflu  for influenza. Please see her chart for documentation and dosing.   Orders Placed This Encounter  Procedures  . POCT Influenza A/B    Meds ordered this encounter  Medications  . oseltamivir (TAMIFLU) 75 MG capsule    Sig: Take 1 capsule (75 mg total) by mouth 2 (two) times daily.    Dispense:  10 capsule    Refill:  0    Tommi Rumps, MD [place]

## 2017-06-10 ENCOUNTER — Encounter: Payer: Self-pay | Admitting: Internal Medicine

## 2017-06-11 ENCOUNTER — Encounter: Payer: Self-pay | Admitting: Internal Medicine

## 2017-06-15 ENCOUNTER — Other Ambulatory Visit: Payer: Self-pay | Admitting: Internal Medicine

## 2017-06-26 ENCOUNTER — Encounter: Payer: Self-pay | Admitting: Internal Medicine

## 2017-07-03 DIAGNOSIS — Z85820 Personal history of malignant melanoma of skin: Secondary | ICD-10-CM | POA: Diagnosis not present

## 2017-07-03 DIAGNOSIS — D229 Melanocytic nevi, unspecified: Secondary | ICD-10-CM | POA: Diagnosis not present

## 2017-07-03 DIAGNOSIS — L814 Other melanin hyperpigmentation: Secondary | ICD-10-CM | POA: Diagnosis not present

## 2017-07-03 DIAGNOSIS — L570 Actinic keratosis: Secondary | ICD-10-CM | POA: Diagnosis not present

## 2017-07-03 DIAGNOSIS — L821 Other seborrheic keratosis: Secondary | ICD-10-CM | POA: Diagnosis not present

## 2017-07-03 DIAGNOSIS — D1801 Hemangioma of skin and subcutaneous tissue: Secondary | ICD-10-CM | POA: Diagnosis not present

## 2017-07-18 ENCOUNTER — Other Ambulatory Visit: Payer: Self-pay | Admitting: Internal Medicine

## 2017-07-19 ENCOUNTER — Other Ambulatory Visit: Payer: Self-pay | Admitting: Internal Medicine

## 2017-07-28 ENCOUNTER — Encounter: Payer: Self-pay | Admitting: Sports Medicine

## 2017-07-29 ENCOUNTER — Ambulatory Visit (INDEPENDENT_AMBULATORY_CARE_PROVIDER_SITE_OTHER): Payer: Medicare Other | Admitting: Sports Medicine

## 2017-07-29 ENCOUNTER — Ambulatory Visit: Payer: Self-pay

## 2017-07-29 ENCOUNTER — Encounter: Payer: Self-pay | Admitting: Sports Medicine

## 2017-07-29 VITALS — BP 168/100 | HR 79 | Ht 72.0 in | Wt 207.8 lb

## 2017-07-29 DIAGNOSIS — M25562 Pain in left knee: Secondary | ICD-10-CM

## 2017-07-29 DIAGNOSIS — M1712 Unilateral primary osteoarthritis, left knee: Secondary | ICD-10-CM

## 2017-07-29 NOTE — Patient Instructions (Signed)

## 2017-07-29 NOTE — Progress Notes (Signed)
[name] [name], [place] at [facility] - 83 y.o. male MRN [id number]  Date of birth: 1931-09-26  Visit Date: 07/29/2017  PCP: Hoyt Koch, MD   Referred by: Hoyt [name], *   Scribe for today's visit: [name], ATC    SUBJECTIVE:  [name] is here for Follow-up (L knee pain) .   [name] is an established patient who was last seen on 05/21/17.  Pt got relief from steroid injection after last in June. Pain started again early Sept 2018 but has gotten progressively worse over the past 1-2 weeks. He had RT knee replacement in 2011 but is unable to have surgery on the LT knee at this time d/t surgery. He has discussed Visco supplementation with [name] in the past but was he says that [name] didn't recommend the injection for him. He has been taking Oxycodone d/t low back pain with left sided sciatica. He see's [name] for this. He had back surgery in 2014 which if when the back pain started. He has noticed some swelling around the knee. Saturday and Sunday he did use compression on the knee and got short term relief with this. Pain is mostly on the medial aspect of the knee.     Compared to the last office visit on 05/21/17, his previously described L knee pain symptoms are worsening due to being on a tractor x 2 days removing snow. Current symptoms are an 8/10 at rest under the L patella & are nonradiating.  Symptoms increase to a 10/10 w/ weight bearing. He has been not been using any modalities or topical rubs.   ROS Denies night time disturbances. Denies fevers, chills, or night sweats. Denies unexplained weight loss. Denies personal history of cancer. Denies changes in bowel or bladder habits. Denies recent unreported falls. Denies new or worsening dyspnea or wheezing. Denies headaches or dizziness.  Reports numbness, tingling or weakness  In the extremities.    Denies dizziness or presyncopal episodes Denies lower extremity edema     HISTORY & PERTINENT PRIOR DATA:  Prior History reviewed and updated per electronic medical record.  Significant history, findings, studies and interim changes include:  reports that he quit smoking about 35 years ago. He has a 33.00 pack-year smoking history. he has never used smokeless tobacco. No results for input(s): HGBA1C, LABURIC, CREATINE in the last 8760 hours. 05/22/17 - orthovisc is covered, coinsurance is 20% of the allowable amount. Problem  Degenerative Arthritis of Left Knee   Injected 06/28/2016 Repeat injection 10/16/2016 Viscous supplementation given and routine left knee. 01/11/17 [name] 82 Depo -consider colchicine for chondrocalcinosis component 05/21/17 - Repeat Depo 07/29/2017 - repeat Depo,   Osteoarthritis of Left Knee   Injection 07/29/2017      OBJECTIVE:  VS:  HT:6' (182.9 cm)   WT:207 lb 12.8 oz (94.3 kg)  BMI:28.18    BP:(!) 168/100  HR:79bpm  TEMP: ( )  RESP:96 %  PHYSICAL EXAM: Constitutional: WDWN, Non-toxic appearing. Psychiatric: Alert & appropriately interactive. Not depressed or anxious appearing. Respiratory: No increased work of breathing. Trachea Midline Eyes: Pupils are equal. EOM intact without nystagmus. No scleral icterus Cardiovascular:  Peripheral Pulses: peripheral pulses symmetrical 1+ pitting edema bilaterally No clubbing or cyanosis appreciated Capillary Refill is normal, less than 2 seconds Sensory Exam: intact to light touch  Left knee has a slight varus deformity with 2-3 mm of opening with  varus and valgus stressing bilaterally.  Solid endpoint with anterior and posterior drawer.  Pain with McMurray's.  Small supraphysiologic effusion with generalized synovitis appreciated.   ASSESSMENT & PLAN:   1. Acute pain of left knee   2. Primary osteoarthritis of left knee    PLAN:    Osteoarthritis of left knee Repeat injection performed today.   Discussed referring to [name] who has just recently done his wife's hip for a total knee arthroplasty but he would like to hold off at this time.  He will call if he needs a referral.  I am happy to see him back at any time to repeat this injection and/or consider Visco supplementation which has been discussed in the future.  Degenerative arthritis of left knee See other associated note   ++++++++++++++++++++++++++++++++++++++++++++ Orders & Meds: Orders Placed This Encounter  Procedures  . [place] LIMITED JOINT SPACE STRUCTURES LOW LEFT(NO LINKED CHARGES)    No orders of the defined types were placed in this encounter.   ++++++++++++++++++++++++++++++++++++++++++++ Follow-up: Return if symptoms worsen or fail to improve.   Pertinent documentation may be included in additional procedure notes, imaging studies, problem based documentation and patient instructions. Please see these sections of the encounter for additional information regarding this visit. CMA/ATC served as Education administrator during this visit. History, Physical, and Plan performed by medical provider. Documentation and orders reviewed and attested to.      [name], [place] [facility] Physician

## 2017-07-29 NOTE — Procedures (Signed)
PROCEDURE NOTE -  ULTRASOUND GUIDEDInjection: Left knee Images were obtained and interpreted by myself, Teresa Coombs, DO  Images have been saved and stored to PACS system. Images obtained on: GE S7 Ultrasound machine  ULTRASOUND FINDINGS:  Significant tricompartmental degenerative changes with small supraphysiologic effusion with starry night appearance of the intra-articular fluid that is minimal  DESCRIPTION OF PROCEDURE:  The patient's clinical condition is marked by substantial pain and/or significant functional disability. Other conservative therapy has not provided relief, is contraindicated, or not appropriate. There is a reasonable likelihood that injection will significantly improve the patient's pain and/or functional impairment.  After discussing the risks, benefits and expected outcomes of the injection and all questions were reviewed and answered, the patient wished to undergo the above named procedure. Verbal consent was obtained.  The ultrasound was used to identify the target structure and adjacent neurovascular structures. The skin was then prepped in sterile fashion and the target structure was injected under direct visualization using sterile technique as below:  Left PREP: Alcohol, Ethel Chloride,   APPROACH: superiolateral, single injection, 25g 1.5in. INJECTATE:  2cc 0.5% marcaine, 2cc 40mg /mL DepoMedrol  ASPIRATE: N/A DRESSING: Band-Aid    Post procedural instructions including recommending icing and warning signs for infection were reviewed.  This procedure was well tolerated and there were no complications.   IMPRESSION: Succesful US Guided Injection

## 2017-07-29 NOTE — Assessment & Plan Note (Signed)
Repeat injection performed today.  Discussed referring to Dr. Marlou Sa who has just recently done his wife's hip for a total knee arthroplasty but he would like to hold off at this time.  He will call if he needs a referral.  I am happy to see him back at any time to repeat this injection and/or consider Visco supplementation which has been discussed in the future.

## 2017-07-29 NOTE — Assessment & Plan Note (Signed)
See other associated note

## 2017-08-08 ENCOUNTER — Encounter: Payer: Self-pay | Admitting: Internal Medicine

## 2017-08-09 ENCOUNTER — Encounter: Payer: Self-pay | Admitting: Internal Medicine

## 2017-08-15 ENCOUNTER — Encounter: Payer: Self-pay | Admitting: Internal Medicine

## 2017-08-15 MED ORDER — FINASTERIDE 5 MG PO TABS: 5.0000 mg | ORAL_TABLET | Freq: Every day | ORAL | 3 refills | Status: DC

## 2017-08-16 DIAGNOSIS — M5440 Lumbago with sciatica, unspecified side: Secondary | ICD-10-CM | POA: Diagnosis not present

## 2017-08-16 DIAGNOSIS — Z6827 Body mass index (BMI) 27.0-27.9, adult: Secondary | ICD-10-CM | POA: Diagnosis not present

## 2017-08-16 DIAGNOSIS — M5136 Other intervertebral disc degeneration, lumbar region: Secondary | ICD-10-CM | POA: Diagnosis not present

## 2017-08-16 DIAGNOSIS — M4726 Other spondylosis with radiculopathy, lumbar region: Secondary | ICD-10-CM | POA: Diagnosis not present

## 2017-08-16 DIAGNOSIS — M5416 Radiculopathy, lumbar region: Secondary | ICD-10-CM | POA: Diagnosis not present

## 2017-08-16 DIAGNOSIS — Z981 Arthrodesis status: Secondary | ICD-10-CM | POA: Diagnosis not present

## 2017-08-16 MED ORDER — TADALAFIL 5 MG PO TABS: 5.0000 mg | ORAL_TABLET | Freq: Every day | ORAL | 6 refills | Status: DC

## 2017-08-20 ENCOUNTER — Other Ambulatory Visit: Payer: Self-pay | Admitting: Podiatry

## 2017-08-20 ENCOUNTER — Ambulatory Visit (INDEPENDENT_AMBULATORY_CARE_PROVIDER_SITE_OTHER): Payer: Medicare Other

## 2017-08-20 ENCOUNTER — Ambulatory Visit (INDEPENDENT_AMBULATORY_CARE_PROVIDER_SITE_OTHER): Payer: Medicare Other | Admitting: Podiatry

## 2017-08-20 DIAGNOSIS — M19071 Primary osteoarthritis, right ankle and foot: Secondary | ICD-10-CM

## 2017-08-20 DIAGNOSIS — M779 Enthesopathy, unspecified: Secondary | ICD-10-CM

## 2017-08-20 DIAGNOSIS — M722 Plantar fascial fibromatosis: Secondary | ICD-10-CM | POA: Diagnosis not present

## 2017-08-20 MED ORDER — METHYLPREDNISOLONE 4 MG PO TBPK: ORAL_TABLET | ORAL | 0 refills | Status: DC

## 2017-08-20 NOTE — Progress Notes (Unsigned)
   Subjective:    Patient ID: [name], male    DOB: April 04, 1932, 84 y.o.   MRN: [id number]  HPI  Chief Complaint  Patient presents with  . Foot Pain    right foot and ankle pain x 6 months - no specific injury, but onset was sudden. He has been caring for his wife after hip replacement (Oct 2018) and is just now able to take care of himself       Review of Systems  All other systems reviewed and are negative.      Objective:   Physical Exam        Assessment & Plan:

## 2017-08-21 DIAGNOSIS — M722 Plantar fascial fibromatosis: Secondary | ICD-10-CM | POA: Insufficient documentation

## 2017-08-21 DIAGNOSIS — M19071 Primary osteoarthritis, right ankle and foot: Secondary | ICD-10-CM | POA: Insufficient documentation

## 2017-08-21 NOTE — Progress Notes (Signed)
Subjective:   Patient ID: [name], male   DOB: 84 y.o.   MRN: [id number]   HPI [name] presents the office today for concerns of right foot pain as well as swelling to the foot and ankle.  He also states is been getting pain to the heel.  He states that about 6 months ago he was having pain he saw a physician for this and he was told he had a fracture.  He was not able to immobilize the area really treated because he was taking care of his wife.  He states that recently he started to notice increase in swelling to his ankle and he points on the dorsal medial aspect of the foot where he gets pain as well as the plantar heel.  He denies any recent injury or trauma.  He said no recent treatment otherwise.  He does have a boot at home but he has not been wearing it.  Denies any redness or warmth.  He has no other concerns.   Review of Systems  All other systems reviewed and are negative.  Past Medical History:  Diagnosis Date  . ACTINIC KERATOSIS, HEAD 03/23/2008  . ALLERGIC RHINITIS 03/27/2007  . Arthritis   . BENIGN PROSTATIC HYPERTROPHY, WITH URINARY OBSTRUCTION 10/16/2007  . CAD 08/18/2007   "patient denies any issues with heart, does not see cardiologist"  . Carpal tunnel syndrome, bilateral 09/04/2016  . CARPAL TUNNEL SYNDROME, LEFT 04/13/2008  . Chronic back pain   . Chronic neck pain   . DVT (deep venous thrombosis) ([place])   . ERECTILE DYSFUNCTION, ORGANIC 02/20/2010  . Gout, unspecified 02/20/2010  . Headache(784.0)    migraines hx of  . History of melanoma   . HYPERLIPIDEMIA, WITH HIGH HDL 10/18/2008  . HYPERTENSION 05/09/2007   sees [name]  . KNEE PAIN 10/10/2009  . LOW BACK PAIN 03/27/2007  . NEOPLASM, MALIGNANT, SKIN, TRUNK 05/09/2007  . Patellar tendinitis 10/10/2009  . SKIN CANCER, HX OF 03/27/2007  . UNS ADVRS EFF UNS RX MEDICINAL&BIOLOGICAL SBSTNC 08/18/2007  . Urinary urgency     Past Surgical History:  Procedure Laterality Date  . APPENDECTOMY  1969  . BACK  SURGERY    . BLEPHAROPLASTY    . CARPAL TUNNEL RELEASE    . CHOLECYSTECTOMY    . ESOPHAGOGASTRODUODENOSCOPY (EGD) WITH PROPOFOL N/A 04/26/2015   Procedure: ESOPHAGOGASTRODUODENOSCOPY (EGD) WITH PROPOFOL;  Surgeon: Jordan Castle, MD;  Location: [facility];  Service: Endoscopy;  Laterality: N/A;  . EYE SURGERY     bilateral cataracts removed and vitrectomies  . JOINT REPLACEMENT Right    knee  . KNEE ARTHROSCOPY    . LAMINECTOMY    . LATERAL FUSION LUMBAR SPINE, TRANSVERSE    . LUMBAR LAMINECTOMY/DECOMPRESSION MICRODISCECTOMY Left 11/03/2012   Procedure: LUMBAR LAMINECTOMY/DECOMPRESSION MICRODISCECTOMY 1 LEVEL;  Surgeon: Jordan Spangle, MD;  Location: [facility];  Service: Neurosurgery;  Laterality: Left;  LEFT L5S1 laminotomy foraminotomy and possible microdiskectomy  . MELANOMA EXCISION    . NASAL SINUS SURGERY    . POSTERIOR CERVICAL FUSION/FORAMINOTOMY Right 10/01/2012   Procedure: POSTERIOR CERVICAL FUSION/FORAMINOTOMY LEVEL 1;  Surgeon: Jordan Spangle, MD;  Location: [facility];  Service: Neurosurgery;  Laterality: Right;  Right Cervical seven thoracic one Cervical laminectomy/foraminotomy with posterior cervical arthrodesis   . POSTERIOR LUMBAR FUSION N/A 05/23/2013   Procedure: exploration of lumbar wound, explantation of left interbody implant.;  Surgeon: Jordan Spangle, MD;  Location: [facility] [place];  Service: Neurosurgery;  Laterality: N/A;  . RECTAL SURGERY     Fissure  . VITRECTOMY     right and left 2013     Current Outpatient Medications:  .  carvedilol (COREG) 25 MG tablet, TAKE 1 TABLET BY MOUTH 2 TIMES DAILY WITH A MEAL, Disp: 60 tablet, Rfl: 11 .  [employer] VITAMIN B12 1000 MCG tablet, TAKE 1 TABLET (1,000 MCG TOTAL) BY MOUTH DAILY., Disp: 90 tablet, Rfl: 3 .  finasteride (PROSCAR) 5 MG tablet, Take 1 tablet (5 mg total) by mouth daily., Disp: 90 tablet, Rfl: 3 .  losartan (COZAAR) 100 MG tablet, Take 1 tablet (100 mg total) by mouth daily., Disp: 90 tablet, Rfl: 3 .   magnesium hydroxide (MILK OF MAGNESIA) 800 MG/5ML suspension, Take 15 mLs by mouth every other day., Disp: , Rfl:  .  methylPREDNISolone (MEDROL DOSEPAK) 4 MG TBPK tablet, Take as directed, Disp: 21 tablet, Rfl: 0 .  oseltamivir (TAMIFLU) 75 MG capsule, Take 1 capsule (75 mg total) by mouth 2 (two) times daily., Disp: 10 capsule, Rfl: 0 .  Oxycodone HCl 10 MG TABS, Take 10 mg by mouth 3 (three) times daily as needed (pain). , Disp: , Rfl: 0 .  predniSONE (DELTASONE) 10 MG tablet, TAKE 1 TABLET BY MOUTH EVERY DAY WITH BREAKFAST, Disp: 30 tablet, Rfl: 0 .  tadalafil (CIALIS) 5 MG tablet, Take 1 tablet (5 mg total) by mouth daily., Disp: 30 tablet, Rfl: 6 .  tamsulosin (FLOMAX) 0.4 MG CAPS capsule, TAKE 1 CAPSULE (0.4 MG TOTAL) BY MOUTH DAILY., Disp: 30 capsule, Rfl: 6 .  torsemide (DEMADEX) 10 MG tablet, TAKE 1 TABLET (10 MG TOTAL) BY MOUTH DAILY AS NEEDED., Disp: 30 tablet, Rfl: 0 .  triamcinolone cream (KENALOG) 0.5 %, USE AS DIRECTED, Disp: 60 g, Rfl: 0  Allergies  Allergen Reactions  . Gabapentin     Lower extremity edema  . Lyrica [Pregabalin] Swelling    Social History   Socioeconomic History  . Marital status: Married    Spouse name: Not on file  . Number of children: 3  . Years of education: 57  . Highest education level: Not on file  Social Needs  . Financial resource strain: Not on file  . Food insecurity - worry: Not on file  . Food insecurity - inability: Not on file  . Transportation needs - medical: Not on file  . Transportation needs - non-medical: Not on file  Occupational History  . Occupation: Retired  Tobacco Use  . Smoking status: Former Smoker    Packs/day: 1.00    Years: 33.00    Pack years: 33.00    Last attempt to quit: 09/29/1981    Years since quitting: 35.9  . Smokeless tobacco: Never Used  Substance and Sexual Activity  . Alcohol use: Yes    Alcohol/week: 1.8 oz    Types: 3 Standard drinks or equivalent per week    Comment: Social  . Drug use: No   . Sexual activity: Yes    Partners: Female    Comment: Married  Other Topics Concern  . Not on file  Social History Narrative   Lives at home w/ his wife   Left-handed   Caffeine: 1 cup coffee daily   1 son alive, 2 sons deceased   Retired Building control surveyor, Furniture conservator/restorer.    Education: GED        Objective:  Physical Exam  General: AAO x3, NAD  Dermatological: Skin is warm, dry and supple bilateral. Nails x 10 are  well manicured; remaining integument appears unremarkable at this time. There are no open sores, no preulcerative lesions, no rash or signs of infection present.  Vascular: Dorsalis Pedis artery and Posterior Tibial artery pedal pulses are 2/4 bilateral with immedate capillary fill time. Pedal hair growth present. No varicosities and no lower extremity edema present bilateral. There is no pain with calf compression, swelling, warmth, erythema.   Neruologic: Grossly intact via light touch bilateral. Vibratory intact via tuning fork bilateral. Protective threshold with Semmes Wienstein monofilament intact to all pedal sites bilateral. Patellar and Achilles deep tendon reflexes 2+ bilateral. No Babinski or clonus noted bilateral.   Musculoskeletal: There is tenderness to the dorsal medial aspect of the midfoot on the talonavicular joint.  There is also tenderness along the plantar medial tubercle of the calcaneus at the insertion of the plantar fascia on the right side.  Plantar fascia appears to be intact.  Achilles tendon intact without any defect or pain.  There is swelling to the ankle and foot but there is no erythema or increase in warmth.  There is no other area of tenderness identified at this time.  Muscular strength 5/5 in all groups tested bilateral.  Gait: Unassisted, Nonantalgic.     Assessment:   84 year old male with osteoarthritis, capsulitis with plantar fasciitis right side     Plan:  -Treatment options discussed including all alternatives, risks, and  complications -Etiology of symptoms were discussed -X-rays were obtained and reviewed with the patient.  Arthritic changes present of the talonavicular navicular cuneiform joint but unable to appreciate any evidence of acute fracture.  Possibly old fracture of the fibula present. -We discussed both conservative as well as surgical treatment options.  After discussion regards to what works best for him we discussed a steroid injection for the plantar fashion he wishes to proceed with this.  See procedure note below.  Also oral steroids, Medrol Dosepak was prescribed.  Compression anklet was prescribed.  Discussed stretching, icing exercises daily.  I recommended an ankle brace but he declined this.  Discussed that he can wear the cam boot that he has at home.  We will start with this and I will follow within the next couple weeks if symptoms continue or sooner if any issues are to arise.  He agrees this plan has no further questions or concerns.  Procedure: Injection Tendon/Ligament Discussed alternatives, risks, complications and verbal consent was obtained.  Location: Right plantar fascia at the glabrous junction; medial approach. Skin Prep: Alcohol  Injectate: 1 cc 0.5% marcaine plain, 1 cc 0.5% Marcaine plain and, 1 cc kenalog 10. Disposition: Patient tolerated procedure well. Injection site dressed with a band-aid.  Post-injection care was discussed and return precautions discussed.   Trula Slade DPM

## 2017-08-27 ENCOUNTER — Encounter: Payer: Self-pay | Admitting: Internal Medicine

## 2017-09-02 DIAGNOSIS — M5416 Radiculopathy, lumbar region: Secondary | ICD-10-CM | POA: Diagnosis not present

## 2017-09-04 ENCOUNTER — Encounter: Payer: Self-pay | Admitting: Internal Medicine

## 2017-09-05 NOTE — Telephone Encounter (Signed)
Can you open a phone note on his wife as this is about her and not him. Her DOB 02/27/1937

## 2017-10-01 ENCOUNTER — Ambulatory Visit (INDEPENDENT_AMBULATORY_CARE_PROVIDER_SITE_OTHER): Payer: Medicare Other | Admitting: Podiatry

## 2017-10-01 ENCOUNTER — Telehealth: Payer: Self-pay | Admitting: *Deleted

## 2017-10-01 DIAGNOSIS — M19079 Primary osteoarthritis, unspecified ankle and foot: Secondary | ICD-10-CM

## 2017-10-01 DIAGNOSIS — M19071 Primary osteoarthritis, right ankle and foot: Secondary | ICD-10-CM | POA: Diagnosis not present

## 2017-10-01 DIAGNOSIS — G8929 Other chronic pain: Secondary | ICD-10-CM

## 2017-10-01 DIAGNOSIS — M79671 Pain in right foot: Secondary | ICD-10-CM

## 2017-10-01 NOTE — Telephone Encounter (Signed)
-----   Message from Trula Slade, DPM sent at 10/01/2017  1:24 PM EST ----- Can you please order an MRI of the right foot for surgical planning of osteoarthritis, chronic foot pain.

## 2017-10-01 NOTE — Telephone Encounter (Signed)
Orders faxed to [facility]. 

## 2017-10-02 NOTE — Progress Notes (Signed)
Subjective: [name] presents the office today for follow-up evaluation and continued pain to his right foot.  He states in the morning he first gets up he gets an occasional heel pain.  The injection did help.  He states the majority of pain is more to the top of the right foot and this is on a daily basis every time he walks and stands. He states that he would like to have surgery to have the area of fixed.  He states that he would like to be able to walk in with his wife without any pain. Denies any systemic complaints such as fevers, chills, nausea, vomiting. No acute changes since last appointment, and no other complaints at this time.   Objective: AAO x3, NAD DP/PT pulses palpable bilaterally, CRT less than 3 seconds At this time there is no significant discomfort to the plantar medial tubercle of the calcaneus at the insertion of plantar fascia.  Achilles tendon appears to be intact.  There is mild discomfort to the arch of the foot on the midfoot but also to the dorsal aspect of the midfoot.  There is no specific area pinpoint tenderness there is no pain to vibratory sensation.  There is no overlying edema, erythema, increase in warmth.  No open lesions or pre-ulcerative lesions.  No pain with calf compression, swelling, warmth, erythema  Assessment: Ongoing right midfoot pain  Plan: -All treatment options discussed with the patient including all alternatives, risks, complications.  -At this point we discussed treatment options.  He is walking into the office with a limp and he states that he is continue to have quite a bit of pain to the area and wants to have surgery to have the area of fixed.  This is been ongoing now for over 7 months.  I have recommended MRI to evaluate the joints and also rule out a tendon tear to the area this is for surgical planning.  We discussed that if he ended up having surgery we discussed the long-term effects and this may not guarantee resolution of symptoms and he  understands this.  We will further discuss after the MRI. -Patient encouraged to call the office with any questions, concerns, change in symptoms.   Trula Slade DPM

## 2017-10-05 ENCOUNTER — Other Ambulatory Visit: Payer: Self-pay | Admitting: Internal Medicine

## 2017-10-11 ENCOUNTER — Encounter: Payer: Self-pay | Admitting: Internal Medicine

## 2017-10-12 ENCOUNTER — Ambulatory Visit
Admission: RE | Admit: 2017-10-12 | Discharge: 2017-10-12 | Disposition: A | Discharge status: Home / Self Care | Payer: Medicare Other | Source: Ambulatory Visit | Attending: Podiatry | Admitting: Podiatry

## 2017-10-12 DIAGNOSIS — M19079 Primary osteoarthritis, unspecified ankle and foot: Secondary | ICD-10-CM

## 2017-10-12 DIAGNOSIS — M19071 Primary osteoarthritis, right ankle and foot: Secondary | ICD-10-CM | POA: Diagnosis not present

## 2017-10-14 ENCOUNTER — Other Ambulatory Visit: Payer: Self-pay | Admitting: Internal Medicine

## 2017-10-16 ENCOUNTER — Encounter: Payer: Self-pay | Admitting: Podiatry

## 2017-10-18 ENCOUNTER — Encounter: Payer: Self-pay | Admitting: Internal Medicine

## 2017-10-28 ENCOUNTER — Encounter: Payer: Self-pay | Admitting: Podiatry

## 2017-10-28 ENCOUNTER — Encounter: Payer: Self-pay | Admitting: Sports Medicine

## 2017-10-28 ENCOUNTER — Telehealth: Payer: Self-pay | Admitting: Family Medicine

## 2017-10-28 ENCOUNTER — Ambulatory Visit (INDEPENDENT_AMBULATORY_CARE_PROVIDER_SITE_OTHER): Payer: Medicare Other | Admitting: Sports Medicine

## 2017-10-28 ENCOUNTER — Encounter: Payer: Self-pay | Admitting: Family Medicine

## 2017-10-28 VITALS — BP 156/82 | HR 90 | Ht 72.0 in | Wt 207.8 lb

## 2017-10-28 DIAGNOSIS — M67911 Unspecified disorder of synovium and tendon, right shoulder: Secondary | ICD-10-CM | POA: Diagnosis not present

## 2017-10-28 NOTE — Patient Instructions (Signed)

## 2017-10-28 NOTE — Progress Notes (Signed)
[name] [name], [place] at [facility] - 84 y.o. male MRN [id number]  Date of birth: October 26, 1931  Visit Date:   PCP: Hoyt Koch, MD   Referred by: Hoyt Koch, *  Please see additional documentation for HPI, review of systems.  HISTORY & PERTINENT PRIOR DATA:  Prior History reviewed and updated per electronic medical record.  Significant history, findings, studies and interim changes include:  reports that he quit smoking about 36 years ago. He has a 33.00 pack-year smoking history. he has never used smokeless tobacco. No results for input(s): HGBA1C, LABURIC, CREATINE in the last 8760 hours. 09/10/2017 - Orthovisc is covered by Medicare 80/20, no copay, deductible has been met. [employer] will cover 20% coinsurance from Medicare. Buy and Bill only, not covered under pharmacy benefits.   05/22/17 - orthovisc is covered, coinsurance is 20% of the allowable amount. Problem  Rotator Cuff Disorder   MRI right shoulder 05/25/2016: Complete tear of the supraspinatus, subscap and intra-articular portion long head of biceps.  Large glenohumeral joint effusion.  Seen at [facility] and recommended avoiding surgical intervention at that time.  He does have moderate degree of osteoarthritis as well and could be a candidate for a total shoulder is persistent lack of improvement or significant currently being managed well with injections. Bilateral injections given 03/16/2016 Right sided injection given 04/30/2016 Right-sided injection on 10/28/17      OBJECTIVE:  VS:  HT:6' (182.9 cm)   WT:207 lb 12.8 oz (94.3 kg)  BMI:28.18    BP:(!) 156/82  HR:90bpm  TEMP: ( )  RESP:96 %   PHYSICAL EXAM: WDWN, Non-toxic appearing. Psychiatric: Alert & appropriately interactive.  Not depressed or anxious appearing. Respiratory: No increased work of breathing.  Trachea Midline Eyes: Pupils are equal.  EOM  intact without nystagmus.  No scleral icterus  NEUROVASCULAR exam: No clubbing or cyanosis appreciated No significant venous stasis changes normal, less than 2 seconds   Right shoulder with generalized bossing and small effusion but no overlying swelling or palpable defect.  He does have pain with Luan Pulling, Neer's.  External rotation and internal rotation strength is limited by pain.  Small amount of crepitation with axial load and circumduction  ASSESSMENT & PLAN:   1. Disorder of right rotator cuff    Orders & Meds: No orders of the defined types were placed in this encounter.  No orders of the defined types were placed in this encounter.   PLAN: Right subacromial injection performed today.  PROCEDURE NOTE:  [place] Guided: Injection: Right shoulder  DESCRIPTION OF PROCEDURE:  The patient's clinical condition is marked by substantial pain and/or significant functional disability. Other conservative therapy has not provided relief, is contraindicated, or not appropriate. There is a reasonable likelihood that injection will significantly improve the patient's pain and/or functional impairment.  After discussing the risks, benefits and expected outcomes of the injection and all questions were reviewed and answered, the patient wished to undergo the above named procedure. Verbal consent was obtained. The skin was then prepped in sterile fashion and the target structure was injected as below:  PREP: Alcohol, Ethel Chloride,  APPROACH: posterior, single injection, 22g 1.5in. INJECTATE: 3cc: 1% lidocaine, 1cc: 67m/mL DepoMedrol   ASPIRATE: None   DRESSING: Band-Aid   Post procedural instructions including recommending icing and warning signs for infection were reviewed.   This procedure was well tolerated and there were no complications.  Rotator cuff disorder Right Subacromial injection today. F/u as needed. See overview   Follow-up: Return if symptoms worsen or  fail to improve.

## 2017-10-28 NOTE — Telephone Encounter (Signed)
Spoke to pt, scheduled him today at 3pm with Dr. [name].

## 2017-10-28 NOTE — Progress Notes (Signed)
  [name] - 84 y.o. male MRN [id number]  Date of birth: [birth year]  Scribe for today's visit: Wendy Poet, LAT, ATC     SUBJECTIVE:  [name] is here for Initial Assessment (R shoulder pain) .   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  Most recently, his shoulder flared up over the weekend w/ no known MOI. Described as mod-severe sharp, aching pain, nonradiating Worsened with nothing specific. Improved with nothing noted Additional associated symptoms include: no mechanical symptoms noted and no N/T into the R UE.    At this time symptoms are worsening compared to onset w/ increased pain. He has been taking 10mg  of Oxycodone tid for a knee problem but notes that this doesn't help his R shoulder   Pt was seen at [facility] approximately one year ago and had an MRI at that time.   ROS Denies night time disturbances. Reports fevers, chills, or night sweats.  Night sweats only at the chest level. Denies unexplained weight loss. Denies personal history of cancer. Denies changes in bowel or bladder habits. Denies recent unreported falls. Denies new or worsening dyspnea or wheezing. Denies headaches or dizziness.  Denies numbness, tingling or weakness  In the extremities.  Denies dizziness or presyncopal episodes Denies lower extremity edema     Please see additional documentation for Objective, Assessment and Plan sections. Pertinent additional documentation may be included in corresponding procedure notes, imaging studies, problem based documentation and patient instructions. Please see these sections of the encounter for additional information regarding this visit.  CMA/ATC served as Education administrator during this visit. History, Physical, and Plan performed by medical provider. Documentation and orders reviewed and attested to.      Gerda Diss, [place] [facility] Physician

## 2017-10-28 NOTE — Telephone Encounter (Signed)
Patient called [facility] health on 3/16 requesting appointment with Dr. Tamala Julian for today for shoulder pain.  Patient requesting injection.

## 2017-10-28 NOTE — Assessment & Plan Note (Signed)
Right Subacromial injection today. F/u as needed. See overview

## 2017-10-31 ENCOUNTER — Ambulatory Visit (INDEPENDENT_AMBULATORY_CARE_PROVIDER_SITE_OTHER): Payer: Medicare Other | Admitting: Podiatry

## 2017-10-31 ENCOUNTER — Encounter: Payer: Self-pay | Admitting: Podiatry

## 2017-10-31 DIAGNOSIS — M779 Enthesopathy, unspecified: Secondary | ICD-10-CM | POA: Diagnosis not present

## 2017-10-31 DIAGNOSIS — M19079 Primary osteoarthritis, unspecified ankle and foot: Secondary | ICD-10-CM | POA: Diagnosis not present

## 2017-10-31 MED ORDER — PREDNISONE 10 MG PO TABS: ORAL_TABLET | ORAL | 0 refills | Status: DC

## 2017-11-04 NOTE — Progress Notes (Signed)
Subjective: Mr. Husted presents to the office today for follow-up evaluation of right foot pain and to discuss the MRI. He states that the foot is doing about the same.  He said no changes in the last saw him he has no other concerns today.  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  Denies any systemic complaints such as fevers, chills, nausea, vomiting. No acute changes since last appointment, and no other complaints at this time.   Objective: AAO x3, NAD DP/PT pulses palpable bilaterally, CRT less than 3 seconds There is tenderness palpation on the right dorsal midfoot mostly on the toe navicular joint also mildly to the subtalar joint on the sinus tarsi.  There is no significant edema, erythema, increase in warmth.  There is no area pinpoint bony tenderness pain to vibratory sensation.  No other areas of tenderness. No open lesions or pre-ulcerative lesions.  No pain with calf compression, swelling, warmth, erythema  MRI right foot: IMPRESSION: 1. Severe advanced osteoarthritis of the talonavicular joint. 2. Extra-articular subcortical reactive marrow edema in the lateral talus and lateral calcaneus which can be seen with lateral hindfoot impingement. 3. Moderate tendinosis of the peroneus brevis.   Assessment: 84 year old male with osteoarthritis; capsulitis  Plan: -All treatment options discussed with the patient including all alternatives, risks, complications.  -MRI results were discussed with the patient.  See above.  We discussed both conservative as well as surgical treatment options.  He states he was to have the area of fixed.  We discussed the surgery as well as the postoperative course.  However we discussed that he has not had significant conservative treatment and given that he is the caregiver for his wife I think that doing more conservative treatment be beneficial for him.  I prescribed a Medrol Dosepak.  Also we can evaluate him for possible bracing or shoes.  We are going to do  a stiffer soled shoe and he was recommended to go to the shoe market to purchase this on [employer] recommendations.  We will do a steel plate shoe.  Trula Slade DPM  -Patient encouraged to call the office with any questions, concerns, change in symptoms.

## 2017-11-06 ENCOUNTER — Emergency Department
Admission: EM | Admit: 2017-11-06 | Discharge: 2017-11-06 | Disposition: A | Discharge status: Home / Self Care | Payer: Medicare Other | Attending: Emergency Medicine | Admitting: Emergency Medicine

## 2017-11-06 ENCOUNTER — Other Ambulatory Visit: Payer: Self-pay

## 2017-11-06 DIAGNOSIS — I10 Essential (primary) hypertension: Secondary | ICD-10-CM | POA: Insufficient documentation

## 2017-11-06 DIAGNOSIS — Z96651 Presence of right artificial knee joint: Secondary | ICD-10-CM | POA: Insufficient documentation

## 2017-11-06 DIAGNOSIS — W208XXA Other cause of strike by thrown, projected or falling object, initial encounter: Secondary | ICD-10-CM | POA: Diagnosis not present

## 2017-11-06 DIAGNOSIS — Y9389 Activity, other specified: Secondary | ICD-10-CM | POA: Insufficient documentation

## 2017-11-06 DIAGNOSIS — Z79899 Other long term (current) drug therapy: Secondary | ICD-10-CM | POA: Diagnosis not present

## 2017-11-06 DIAGNOSIS — Y929 Unspecified place or not applicable: Secondary | ICD-10-CM | POA: Diagnosis not present

## 2017-11-06 DIAGNOSIS — Z23 Encounter for immunization: Secondary | ICD-10-CM | POA: Insufficient documentation

## 2017-11-06 DIAGNOSIS — Z87891 Personal history of nicotine dependence: Secondary | ICD-10-CM | POA: Diagnosis not present

## 2017-11-06 DIAGNOSIS — S51811A Laceration without foreign body of right forearm, initial encounter: Secondary | ICD-10-CM | POA: Insufficient documentation

## 2017-11-06 DIAGNOSIS — Y999 Unspecified external cause status: Secondary | ICD-10-CM | POA: Diagnosis not present

## 2017-11-06 MED ORDER — BACITRACIN ZINC 500 UNIT/GM EX OINT
TOPICAL_OINTMENT | CUTANEOUS | Status: AC
  Administered 2017-11-06: 2 via TOPICAL
  Filled 2017-11-06: qty 1.8

## 2017-11-06 MED ORDER — BACITRACIN ZINC 500 UNIT/GM EX OINT
TOPICAL_OINTMENT | Freq: Once | CUTANEOUS | Status: AC
  Administered 2017-11-06: 2 via TOPICAL
  Filled 2017-11-06: qty 0.9

## 2017-11-06 MED ORDER — LIDOCAINE HCL (PF) 1 % IJ SOLN
5.0000 mL | Freq: Once | INTRAMUSCULAR | Status: AC
  Administered 2017-11-06: 5 mL
  Filled 2017-11-06: qty 5

## 2017-11-06 MED ORDER — TETANUS-DIPHTH-ACELL PERTUSSIS 5-2.5-18.5 LF-MCG/0.5 IM SUSP
0.5000 mL | Freq: Once | INTRAMUSCULAR | Status: AC
  Administered 2017-11-06: 0.5 mL via INTRAMUSCULAR
  Filled 2017-11-06: qty 0.5

## 2017-11-06 NOTE — ED Triage Notes (Signed)
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

## 2017-11-06 NOTE — ED Notes (Signed)
Wound irrigated at this time.

## 2017-11-06 NOTE — ED Provider Notes (Signed)
[facility] Emergency Department Provider Note ____________________________________________  Time seen: 1500  I have reviewed the triage vital signs and the nursing notes.  HISTORY  Chief Complaint  Laceration  HPI [name] is a 84 y.o. male presents to the ED for evaluation of a laceration sustained to the right forearm.  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  He reached to work on the engine and the seat fell across his arm causing the deep laceration.  He denies any other injury at this time.  He reports the need for a tetanus booster.  Past Medical History:  Diagnosis Date  . ACTINIC KERATOSIS, HEAD 03/23/2008  . ALLERGIC RHINITIS 03/27/2007  . Arthritis   . BENIGN PROSTATIC HYPERTROPHY, WITH URINARY OBSTRUCTION 10/16/2007  . CAD 08/18/2007   "patient denies any issues with heart, does not see cardiologist"  . Carpal tunnel syndrome, bilateral 09/04/2016  . CARPAL TUNNEL SYNDROME, LEFT 04/13/2008  . Chronic back pain   . Chronic neck pain   . DVT (deep venous thrombosis) ([place])   . ERECTILE DYSFUNCTION, ORGANIC 02/20/2010  . Gout, unspecified 02/20/2010  . Headache(784.0)    migraines hx of  . History of melanoma   . HYPERLIPIDEMIA, WITH HIGH HDL 10/18/2008  . HYPERTENSION 05/09/2007   sees Dr. Benay Pillow  . KNEE PAIN 10/10/2009  . LOW BACK PAIN 03/27/2007  . NEOPLASM, MALIGNANT, SKIN, TRUNK 05/09/2007  . Patellar tendinitis 10/10/2009  . SKIN CANCER, HX OF 03/27/2007  . UNS ADVRS EFF UNS RX MEDICINAL&BIOLOGICAL SBSTNC 08/18/2007  . Urinary urgency     Patient Active Problem List   Diagnosis Date Noted  . Plantar fasciitis, right 08/21/2017  . Primary osteoarthritis of right foot 08/21/2017  . Influenza 06/08/2017  . Syncope 02/27/2017  . Arthritis of right ankle 12/18/2016  . Cough 09/11/2016  . Carpal tunnel syndrome, bilateral 09/04/2016  . Leg swelling 08/16/2016  . Hand weakness 07/12/2016  . Degenerative arthritis  of left knee 06/28/2016  . Iron deficiency anemia 03/16/2016  . Arthritis of left acromioclavicular joint 01/03/2016  . Rotator cuff disorder 01/03/2016  . Osteoarthritis of left knee 04/30/2015  . Baker's cyst of knee 04/30/2015  . Esophageal stricture 04/26/2015  . BPH (benign prostatic hypertrophy) 02/21/2015  . Constipation 02/21/2015  . Myalgia and myositis 11/18/2014  . Polyarticular arthritis 07/19/2013  . Chronic radicular low back pain 12/15/2012  . History of gout 02/20/2010  . ERECTILE DYSFUNCTION, ORGANIC 02/20/2010  . HYPERLIPIDEMIA, WITH HIGH HDL 10/18/2008  . CAD 08/18/2007  . Essential hypertension 05/09/2007  . ALLERGIC RHINITIS 03/27/2007    Past Surgical History:  Procedure Laterality Date  . APPENDECTOMY  1969  . BACK SURGERY    . BLEPHAROPLASTY    . CARPAL TUNNEL RELEASE    . CHOLECYSTECTOMY    . ESOPHAGOGASTRODUODENOSCOPY (EGD) WITH PROPOFOL N/A 04/26/2015   Procedure: ESOPHAGOGASTRODUODENOSCOPY (EGD) WITH PROPOFOL;  Surgeon: Inda Castle, MD;  Location: [facility];  Service: Endoscopy;  Laterality: N/A;  . EYE SURGERY     bilateral cataracts removed and vitrectomies  . JOINT REPLACEMENT Right    knee  . KNEE ARTHROSCOPY    . LAMINECTOMY    . LATERAL FUSION LUMBAR SPINE, TRANSVERSE    . LUMBAR LAMINECTOMY/DECOMPRESSION MICRODISCECTOMY Left 11/03/2012   Procedure: LUMBAR LAMINECTOMY/DECOMPRESSION MICRODISCECTOMY 1 LEVEL;  Surgeon: Hosie Spangle, MD;  Location: [place] [facility];  Service: Neurosurgery;  Laterality: Left;  LEFT L5S1 laminotomy foraminotomy and possible microdiskectomy  .  MELANOMA EXCISION    . NASAL SINUS SURGERY    . POSTERIOR CERVICAL FUSION/FORAMINOTOMY Right 10/01/2012   Procedure: POSTERIOR CERVICAL FUSION/FORAMINOTOMY LEVEL 1;  Surgeon: Hosie Spangle, MD;  Location: [place] [facility];  Service: Neurosurgery;  Laterality: Right;  Right Cervical seven thoracic one Cervical laminectomy/foraminotomy with posterior cervical arthrodesis    . POSTERIOR LUMBAR FUSION N/A 05/23/2013   Procedure: exploration of lumbar wound, explantation of left interbody implant.;  Surgeon: Hosie Spangle, MD;  Location: [place];  Service: Neurosurgery;  Laterality: N/A;  . RECTAL SURGERY     Fissure  . VITRECTOMY     right and left 2013    Prior to Admission medications   Medication Sig Start Date End Date Taking? Authorizing Provider  carvedilol (COREG) 25 MG tablet TAKE 1 TABLET BY MOUTH 2 TIMES DAILY WITH A MEAL 10/07/17   Hoyt Koch, MD  [facility]S VITAMIN B12 1000 MCG tablet TAKE 1 TABLET (1,000 MCG TOTAL) BY MOUTH DAILY. 07/19/17   Hoyt Koch, MD  finasteride (PROSCAR) 5 MG tablet Take 1 tablet (5 mg total) by mouth daily. 08/15/17   Hoyt Koch, MD  losartan (COZAAR) 100 MG tablet Take 1 tablet (100 mg total) by mouth daily. 04/18/17   Hoyt Koch, MD  magnesium hydroxide (MILK OF MAGNESIA) 800 MG/5ML suspension Take 15 mLs by mouth every other day.    [provider]  methylPREDNISolone (MEDROL DOSEPAK) 4 MG TBPK tablet Take as directed 08/20/17   Trula Slade, DPM  oseltamivir (TAMIFLU) 75 MG capsule Take 1 capsule (75 mg total) by mouth 2 (two) times daily. 06/08/17   Leone Haven, MD  Oxycodone HCl 10 MG TABS Take 10 mg by mouth 3 (three) times daily as needed (pain).  07/14/14   [provider]  predniSONE (DELTASONE) 10 MG tablet TAKE 1 TABLET BY MOUTH EVERY DAY WITH BREAKFAST 07/19/17   Hoyt Koch, MD  predniSONE (DELTASONE) 10 MG tablet 12 day taper dose 10/31/17   Trula Slade, DPM  tamsulosin (FLOMAX) 0.4 MG CAPS capsule TAKE 1 CAPSULE (0.4 MG TOTAL) BY MOUTH DAILY. 04/10/16   Hoyt Koch, MD  triamcinolone cream (KENALOG) 0.5 % USE AS DIRECTED 10/14/17   Hoyt Koch, MD    Allergies Gabapentin and Lyrica [pregabalin]  Family History  Problem Relation Age of Onset  . Dementia Mother   . Anemia Father   . Heart disease Father   .  Stroke Sister        ICH  . Lung disease Sister     Social History Social History   Tobacco Use  . Smoking status: Former Smoker    Packs/day: 1.00    Years: 33.00    Pack years: 33.00    Last attempt to quit: 09/29/1981    Years since quitting: 36.1  . Smokeless tobacco: Never Used  Substance Use Topics  . Alcohol use: Yes    Alcohol/week: 1.8 oz    Types: 3 Standard drinks or equivalent per week    Comment: Social  . Drug use: No    Review of Systems  Constitutional: Negative for fever. Eyes: Negative for visual changes. ENT: Negative for sore throat. Cardiovascular: Negative for chest pain. Respiratory: Negative for shortness of breath. Gastrointestinal: Negative for abdominal pain, vomiting and diarrhea. Genitourinary: Negative for dysuria. Musculoskeletal: Negative for back pain. Skin: Negative for rash.  Right forearm laceration as above. Neurological: Negative for headaches, focal weakness or numbness. ____________________________________________  PHYSICAL EXAM:  VITAL SIGNS: ED Triage Vitals  Enc Vitals Group     BP 11/06/17 1403 (!) 196/98     Pulse Rate 11/06/17 1403 99     Resp 11/06/17 1403 16     Temp 11/06/17 1403 98 F (36.7 C)     Temp Source 11/06/17 1403 Oral     SpO2 11/06/17 1403 97 %     Weight 11/06/17 1404 205 lb (93 kg)     Height 11/06/17 1404 6' (1.829 m)     Head Circumference --      Peak Flow --      Pain Score 11/06/17 1404 3     Pain Loc --      Pain Edu? --      Excl. in [place]? --     Constitutional: Alert and oriented. Well appearing and in no distress. Head: Normocephalic and atraumatic. Eyes: Conjunctivae are normal. Normal extraocular movements Cardiovascular: Normal rate, regular rhythm. Normal distal pulses. Respiratory: Normal respiratory effort. No wheezes/rales/rhonchi. Musculoskeletal: Nontender with normal range of motion in all extremities.  Right forearm with a dorsal laceration consistent with a large skin flap  with exposure of the subcu fat. Neurologic:  Normal gait without ataxia. Normal gross sensation.  No gross focal neurologic deficits are appreciated. Skin:  Skin is warm, dry and intact. No rash noted. Psychiatric: Mood and affect are normal. Patient exhibits appropriate insight and judgment. ____________________________________________  PROCEDURES  Tdap 0.5 ml IM  .Marland KitchenLaceration Repair Date/Time: 11/06/2017 7:02 PM Performed by: Melvenia Needles, PA-C Authorized by: Melvenia Needles, PA-C   Consent:    Consent obtained:  Verbal   Consent given by:  Patient   Risks discussed:  Poor cosmetic result and poor wound healing Anesthesia (see MAR for exact dosages):    Anesthesia method:  Local infiltration   Local anesthetic:  Lidocaine 1% w/o epi Laceration details:    Location:  Shoulder/arm   Shoulder/arm location:  R lower arm   Length (cm):  8 Repair type:    Repair type:  Simple Pre-procedure details:    Preparation:  Patient was prepped and draped in usual sterile fashion Treatment:    Area cleansed with:  Betadine and saline   Amount of cleaning:  Standard   Irrigation solution:  Sterile saline Skin repair:    Repair method:  Sutures and tissue adhesive   Suture size:  3-0   Suture material:  Nylon   Suture technique:  Simple interrupted   Number of sutures:  3 Approximation:    Approximation:  Close Post-procedure details:    Dressing:  Non-adherent dressing   Patient tolerance of procedure:  Tolerated well, no immediate complications  ___________________________________________  INITIAL IMPRESSION / ASSESSMENT AND PLAN / ED COURSE  Patient with ED evaluation of a laceration to the right forearm.  The wound which was complex because it involves a large skin tear flap as well as a deep laceration exposing subcu fat required both nylon sutures and wound adhesive.  The wound however is closed with good cosmetic result.  Patient is pleased and discharge  wound care instructions.  He will see his primary provider in 10-12 days for suture removal. ___________________________________________  FINAL CLINICAL IMPRESSION(S) / ED DIAGNOSES  Final diagnoses:  Laceration of right forearm, initial encounter      Melvenia Needles, PA-C 11/06/17 [place], [place], MD 11/09/17 1505

## 2017-11-06 NOTE — Discharge Instructions (Addendum)
You have had your arm laceration & skin tear repaired with sutures and wound adhesive. Keep the wound clean, dry, and covered. Avoid any ointments, lotions, or creams, to the forearm.

## 2017-11-10 ENCOUNTER — Encounter: Payer: Self-pay | Admitting: Internal Medicine

## 2017-11-11 ENCOUNTER — Ambulatory Visit (INDEPENDENT_AMBULATORY_CARE_PROVIDER_SITE_OTHER): Payer: Medicare Other | Admitting: Internal Medicine

## 2017-11-11 ENCOUNTER — Encounter: Payer: Self-pay | Admitting: Internal Medicine

## 2017-11-11 DIAGNOSIS — M79601 Pain in right arm: Secondary | ICD-10-CM | POA: Diagnosis not present

## 2017-11-11 MED ORDER — FINASTERIDE 5 MG PO TABS: 5.0000 mg | ORAL_TABLET | Freq: Every day | ORAL | 3 refills | Status: DC

## 2017-11-11 NOTE — Assessment & Plan Note (Signed)
Wound not infected appearing. Can continue home pain regimen. Can use ice for swelling. Come back in 7-10 days for suture removal and call back with any changes.

## 2017-11-11 NOTE — Progress Notes (Signed)
   Subjective:    Patient ID: [name], male    DOB: 09-26-1931, 84 y.o.   MRN: [id number]  HPI The patient is an 84 YO man coming coming in for right arm pain. He had an accident while cleaning a golf cart over the weekend and got a cut on his arm. Went to the ER to get stitches (some skin glue and 3 stitches with complex wound). It was doing okay but in the last 12 hours is somewhat swollen and red with some pain. After the accident was not hurting. Then after stitches was not hurting. He denies re-injury or bleeding. Slight serous drainage. No fevers or chills. Not a dirty wound.   Review of Systems  Constitutional: Positive for activity change. Negative for appetite change, fatigue, fever and unexpected weight change.  Respiratory: Negative.   Cardiovascular: Negative.   Musculoskeletal: Positive for arthralgias, back pain and myalgias.  Skin: Positive for wound.  Neurological: Negative for syncope, weakness and numbness.      Objective:   Physical Exam  Constitutional: He is oriented to person, place, and time. He appears well-developed and well-nourished.  HENT:  Head: Normocephalic and atraumatic.  Eyes: EOM are normal.  Neck: Normal range of motion.  Cardiovascular: Normal rate and regular rhythm.  Pulmonary/Chest: Effort normal and breath sounds normal. No respiratory distress. He has no wheezes. He has no rales.  Abdominal: Soft.  Musculoskeletal: He exhibits tenderness. He exhibits no edema.  Neurological: He is alert and oriented to person, place, and time. Coordination normal.  Skin: Skin is warm and dry.  Wound on the right arm about 3cm by 3cm circular. 3 stitches visible, no bleeding. No rash surrounding and minimal tenderness to palpation. Some adjacent bruising without tenderness   Vitals:   11/11/17 1550  BP: (!) 142/80  Pulse: 80  Temp: 98.1 F (36.7 C)  TempSrc: Oral  SpO2: 95%  Weight: 210 lb (95.3 kg)  Height: 6' (1.829 m)      Assessment & Plan:

## 2017-11-12 DIAGNOSIS — Z981 Arthrodesis status: Secondary | ICD-10-CM | POA: Diagnosis not present

## 2017-11-12 DIAGNOSIS — M5136 Other intervertebral disc degeneration, lumbar region: Secondary | ICD-10-CM | POA: Diagnosis not present

## 2017-11-12 DIAGNOSIS — M5440 Lumbago with sciatica, unspecified side: Secondary | ICD-10-CM | POA: Diagnosis not present

## 2017-11-12 DIAGNOSIS — M5416 Radiculopathy, lumbar region: Secondary | ICD-10-CM | POA: Diagnosis not present

## 2017-11-12 DIAGNOSIS — M4726 Other spondylosis with radiculopathy, lumbar region: Secondary | ICD-10-CM | POA: Diagnosis not present

## 2017-11-16 ENCOUNTER — Other Ambulatory Visit: Payer: Self-pay | Admitting: Internal Medicine

## 2017-11-21 ENCOUNTER — Ambulatory Visit (INDEPENDENT_AMBULATORY_CARE_PROVIDER_SITE_OTHER): Payer: Medicare Other | Admitting: Podiatry

## 2017-11-21 DIAGNOSIS — M19079 Primary osteoarthritis, unspecified ankle and foot: Secondary | ICD-10-CM | POA: Diagnosis not present

## 2017-11-23 ENCOUNTER — Encounter: Payer: Self-pay | Admitting: Internal Medicine

## 2017-11-23 ENCOUNTER — Encounter: Payer: Self-pay | Admitting: Podiatry

## 2017-11-24 NOTE — Progress Notes (Signed)
Subjective: [name] presents the office today for follow-up evaluation of arthritis of the right foot.  He continues to get pain at the top of his foot he points along the medial aspect along the talonavicular joint where he has majority of tenderness.  He has not been able to go look at getting a stiffer shoe as he has had other issues going on.  He is still very interested in having surgery done to his right foot. Denies any systemic complaints such as fevers, chills, nausea, vomiting. No acute changes since last appointment, and no other complaints at this time.   Objective: AAO x3, NAD DP/PT pulses palpable bilaterally, CRT less than 3 seconds There is continuation of tenderness mostly to the dorsal medial aspect of the foot on the talonavicular joint.  There is no pain along the ankle or sinus tarsi today there is no pain with ankle or subtalar joint range of motion.  There is no significant edema there is no erythema or increase in warmth.  There is no other areas of tenderness identified at this time. No open lesions or pre-ulcerative lesions.  No pain with calf compression, swelling, warmth, erythema  Assessment: Osteoarthritis right foot, TNJ  Plan: -All treatment options discussed with the patient including all alternatives, risks, complications.  -We again had a long discussion regards to both conservative as well as surgical treatment options.  He is very interested in having surgery as he has pain on a daily basis.  We discussed the surgery as well as the postoperative course as well as risks and complications associated with this type of surgery.  He would likely need to have a talonavicular joint arthrodesis.  Discussed this is not a guarantee resolution of symptoms.  We discussed that he would be nonweightbearing for approximately at minimum 6 weeks before starting to progress to partial weightbearing in a boot then going to full weightbearing.  We discussed that he can take 6-12  months for full recovery.  He is going to think about this and talk to some family members. -I did dispense a graphite insert for him to wear inside of his shoes. -Patient encouraged to call the office with any questions, concerns, change in symptoms.   Trula Slade DPM

## 2017-11-26 ENCOUNTER — Other Ambulatory Visit: Payer: Self-pay | Admitting: Internal Medicine

## 2017-11-26 ENCOUNTER — Encounter: Payer: Self-pay | Admitting: Podiatry

## 2017-12-03 DIAGNOSIS — H26491 Other secondary cataract, right eye: Secondary | ICD-10-CM | POA: Diagnosis not present

## 2017-12-12 ENCOUNTER — Encounter: Payer: Self-pay | Admitting: Podiatry

## 2017-12-25 ENCOUNTER — Other Ambulatory Visit: Payer: Self-pay | Admitting: Internal Medicine

## 2017-12-25 ENCOUNTER — Encounter: Payer: Self-pay | Admitting: Internal Medicine

## 2017-12-26 ENCOUNTER — Encounter: Payer: Self-pay | Admitting: Internal Medicine

## 2017-12-26 ENCOUNTER — Other Ambulatory Visit: Payer: Self-pay | Admitting: Internal Medicine

## 2017-12-26 MED ORDER — FUROSEMIDE 20 MG PO TABS: 20.0000 mg | ORAL_TABLET | Freq: Every day | ORAL | 0 refills | Status: DC | PRN

## 2017-12-27 MED ORDER — MELOXICAM 15 MG PO TABS: 15.0000 mg | ORAL_TABLET | Freq: Every day | ORAL | 3 refills | Status: DC

## 2017-12-31 ENCOUNTER — Encounter: Payer: Self-pay | Admitting: Internal Medicine

## 2018-01-03 DIAGNOSIS — D485 Neoplasm of uncertain behavior of skin: Secondary | ICD-10-CM | POA: Diagnosis not present

## 2018-01-03 DIAGNOSIS — L570 Actinic keratosis: Secondary | ICD-10-CM | POA: Diagnosis not present

## 2018-01-03 DIAGNOSIS — L821 Other seborrheic keratosis: Secondary | ICD-10-CM | POA: Diagnosis not present

## 2018-01-03 DIAGNOSIS — D1801 Hemangioma of skin and subcutaneous tissue: Secondary | ICD-10-CM | POA: Diagnosis not present

## 2018-01-03 DIAGNOSIS — D225 Melanocytic nevi of trunk: Secondary | ICD-10-CM | POA: Diagnosis not present

## 2018-01-03 DIAGNOSIS — C4442 Squamous cell carcinoma of skin of scalp and neck: Secondary | ICD-10-CM | POA: Diagnosis not present

## 2018-01-09 ENCOUNTER — Encounter: Payer: Self-pay | Admitting: Internal Medicine

## 2018-01-10 ENCOUNTER — Encounter: Payer: Self-pay | Admitting: Internal Medicine

## 2018-01-10 DIAGNOSIS — L989 Disorder of the skin and subcutaneous tissue, unspecified: Secondary | ICD-10-CM

## 2018-01-13 ENCOUNTER — Telehealth: Payer: Self-pay

## 2018-01-13 NOTE — Telephone Encounter (Signed)
Error

## 2018-01-15 NOTE — Telephone Encounter (Signed)
I have spoken to pt regarding this referral.

## 2018-01-15 NOTE — Telephone Encounter (Signed)
Spoke to pt and informed him he would need to follow up with current dr to be referred to [facility] regarding his biopsy. He understood this.   He stated he has something on his leg that he wanted looked at and wanted a different doctor. I scheduled him at [facility] and I asked Stefannie in your absence to enter referral so I can send to them.

## 2018-01-17 ENCOUNTER — Other Ambulatory Visit: Payer: Self-pay | Admitting: Internal Medicine

## 2018-01-19 ENCOUNTER — Other Ambulatory Visit: Payer: Self-pay | Admitting: Internal Medicine

## 2018-01-21 ENCOUNTER — Encounter: Payer: Self-pay | Admitting: Internal Medicine

## 2018-01-21 DIAGNOSIS — L819 Disorder of pigmentation, unspecified: Secondary | ICD-10-CM | POA: Diagnosis not present

## 2018-01-21 DIAGNOSIS — L989 Disorder of the skin and subcutaneous tissue, unspecified: Secondary | ICD-10-CM | POA: Diagnosis not present

## 2018-01-21 DIAGNOSIS — L570 Actinic keratosis: Secondary | ICD-10-CM | POA: Diagnosis not present

## 2018-01-21 DIAGNOSIS — D485 Neoplasm of uncertain behavior of skin: Secondary | ICD-10-CM | POA: Diagnosis not present

## 2018-01-23 DIAGNOSIS — L989 Disorder of the skin and subcutaneous tissue, unspecified: Secondary | ICD-10-CM | POA: Diagnosis not present

## 2018-01-24 ENCOUNTER — Encounter: Payer: Self-pay | Admitting: Internal Medicine

## 2018-01-24 MED ORDER — TAMSULOSIN HCL 0.4 MG PO CAPS: ORAL_CAPSULE | ORAL | 3 refills | Status: DC

## 2018-01-26 ENCOUNTER — Encounter: Payer: Self-pay | Admitting: Podiatry

## 2018-01-28 ENCOUNTER — Other Ambulatory Visit: Payer: Self-pay | Admitting: Internal Medicine

## 2018-01-29 ENCOUNTER — Encounter: Payer: Self-pay | Admitting: Internal Medicine

## 2018-02-03 DIAGNOSIS — Z48817 Encounter for surgical aftercare following surgery on the skin and subcutaneous tissue: Secondary | ICD-10-CM | POA: Diagnosis not present

## 2018-02-14 ENCOUNTER — Encounter: Payer: Self-pay | Admitting: Internal Medicine

## 2018-02-16 ENCOUNTER — Other Ambulatory Visit: Payer: Self-pay | Admitting: Internal Medicine

## 2018-02-17 ENCOUNTER — Encounter: Payer: Self-pay | Admitting: Internal Medicine

## 2018-02-19 DIAGNOSIS — C4442 Squamous cell carcinoma of skin of scalp and neck: Secondary | ICD-10-CM | POA: Diagnosis not present

## 2018-02-25 DIAGNOSIS — L080 Pyoderma: Secondary | ICD-10-CM | POA: Diagnosis not present

## 2018-02-27 ENCOUNTER — Encounter: Payer: Self-pay | Admitting: Internal Medicine

## 2018-03-12 ENCOUNTER — Encounter: Payer: Self-pay | Admitting: Family

## 2018-03-12 ENCOUNTER — Ambulatory Visit (INDEPENDENT_AMBULATORY_CARE_PROVIDER_SITE_OTHER): Payer: Medicare Other | Admitting: Family

## 2018-03-12 ENCOUNTER — Telehealth: Payer: Self-pay | Admitting: Internal Medicine

## 2018-03-12 VITALS — BP 192/100 | HR 80 | Temp 98.3°F | Ht 72.0 in | Wt 207.0 lb

## 2018-03-12 DIAGNOSIS — I10 Essential (primary) hypertension: Secondary | ICD-10-CM | POA: Diagnosis not present

## 2018-03-12 MED ORDER — HYDRALAZINE HCL 25 MG PO TABS: 25.0000 mg | ORAL_TABLET | Freq: Two times a day (BID) | ORAL | 1 refills | Status: DC

## 2018-03-12 NOTE — Progress Notes (Signed)
[name] is a 84 y.o. male with the following history as recorded in EpicCare:  Patient Active Problem List   Diagnosis Date Noted  . Right arm pain 11/11/2017  . Primary osteoarthritis of right foot 08/21/2017  . Syncope 02/27/2017  . Arthritis of right ankle 12/18/2016  . Carpal tunnel syndrome, bilateral 09/04/2016  . Leg swelling 08/16/2016  . Hand weakness 07/12/2016  . Degenerative arthritis of left knee 06/28/2016  . Iron deficiency anemia 03/16/2016  . Arthritis of left acromioclavicular joint 01/03/2016  . Rotator cuff disorder 01/03/2016  . Osteoarthritis of left knee 04/30/2015  . Baker's cyst of knee 04/30/2015  . Esophageal stricture 04/26/2015  . BPH (benign prostatic hypertrophy) 02/21/2015  . Constipation 02/21/2015  . Myalgia and myositis 11/18/2014  . Polyarticular arthritis 07/19/2013  . Chronic radicular low back pain 12/15/2012  . History of gout 02/20/2010  . ERECTILE DYSFUNCTION, ORGANIC 02/20/2010  . HYPERLIPIDEMIA, WITH HIGH HDL 10/18/2008  . CAD 08/18/2007  . Essential hypertension 05/09/2007  . ALLERGIC RHINITIS 03/27/2007    Current Outpatient Medications  Medication Sig Dispense Refill  . carvedilol (COREG) 25 MG tablet TAKE 1 TABLET BY MOUTH 2 TIMES DAILY WITH A MEAL 180 tablet 1  . CVS VITAMIN B12 1000 MCG tablet TAKE 1 TABLET (1,000 MCG TOTAL) BY MOUTH DAILY. 90 tablet 3  . finasteride (PROSCAR) 5 MG tablet Take 1 tablet (5 mg total) by mouth daily. 90 tablet 3  . fluticasone (FLONASE) 50 MCG/ACT nasal spray fluticasone propionate 50 mcg/actuation nasal spray,suspension    . furosemide (LASIX) 20 MG tablet TAKE 1 TABLET BY MOUTH EVERY DAY AS NEEDED 90 tablet 0  . losartan (COZAAR) 100 MG tablet Take 1 tablet (100 mg total) by mouth daily. 90 tablet 3  . Oxycodone HCl 10 MG TABS Take 10 mg by mouth 3 (three) times daily as needed (pain).   0  . predniSONE (DELTASONE) 20 MG tablet Take 20 mg by mouth daily with breakfast. Take 40 tablets in  the am; take 20 tablets in the pm    . tamsulosin (FLOMAX) 0.4 MG CAPS capsule TAKE 1 CAPSULE (0.4 MG TOTAL) BY MOUTH DAILY. 90 capsule 3  . triamcinolone cream (KENALOG) 0.5 % USE AS DIRECTED 60 g 0  . hydrALAZINE (APRESOLINE) 25 MG tablet Take 1 tablet (25 mg total) by mouth 2 (two) times daily. Increase to tid when directed 90 tablet 1  . magnesium hydroxide (MILK OF MAGNESIA) 800 MG/5ML suspension Take 15 mLs by mouth every other day.     No current facility-administered medications for this visit.     Allergies: Amlodipine; Gabapentin; and Lyrica [pregabalin]  Past Medical History:  Diagnosis Date  . ACTINIC KERATOSIS, HEAD 03/23/2008  . ALLERGIC RHINITIS 03/27/2007  . Arthritis   . BENIGN PROSTATIC HYPERTROPHY, WITH URINARY OBSTRUCTION 10/16/2007  . CAD 08/18/2007   "patient denies any issues with heart, does not see cardiologist"  . Carpal tunnel syndrome, bilateral 09/04/2016  . CARPAL TUNNEL SYNDROME, LEFT 04/13/2008  . Chronic back pain   . Chronic neck pain   . DVT (deep venous thrombosis) ([place])   . ERECTILE DYSFUNCTION, ORGANIC 02/20/2010  . Gout, unspecified 02/20/2010  . Headache(784.0)    migraines hx of  . History of melanoma   . HYPERLIPIDEMIA, WITH HIGH HDL 10/18/2008  . HYPERTENSION 05/09/2007   sees Dr. Benay Pillow  . KNEE PAIN 10/10/2009  . LOW BACK PAIN 03/27/2007  . NEOPLASM, MALIGNANT, SKIN, TRUNK 05/09/2007  . Patellar tendinitis  10/10/2009  . SKIN CANCER, HX OF 03/27/2007  . UNS ADVRS EFF UNS RX MEDICINAL&BIOLOGICAL SBSTNC 08/18/2007  . Urinary urgency     Past Surgical History:  Procedure Laterality Date  . APPENDECTOMY  1969  . BACK SURGERY    . BLEPHAROPLASTY    . CARPAL TUNNEL RELEASE    . CHOLECYSTECTOMY    . ESOPHAGOGASTRODUODENOSCOPY (EGD) WITH PROPOFOL N/A 04/26/2015   Procedure: ESOPHAGOGASTRODUODENOSCOPY (EGD) WITH PROPOFOL;  Surgeon: Inda Castle, MD;  [facility];  Service: Endoscopy;  Laterality: N/A;  . EYE SURGERY     bilateral  cataracts removed and vitrectomies  . JOINT REPLACEMENT Right    knee  . KNEE ARTHROSCOPY    . LAMINECTOMY    . LATERAL FUSION LUMBAR SPINE, TRANSVERSE    . LUMBAR LAMINECTOMY/DECOMPRESSION MICRODISCECTOMY Left 11/03/2012   Procedure: LUMBAR LAMINECTOMY/DECOMPRESSION MICRODISCECTOMY 1 LEVEL;  Surgeon: Hosie Spangle, MD;  Location: [place] [facility];  Service: Neurosurgery;  Laterality: Left;  LEFT L5S1 laminotomy foraminotomy and possible microdiskectomy  . MELANOMA EXCISION    . NASAL SINUS SURGERY    . POSTERIOR CERVICAL FUSION/FORAMINOTOMY Right 10/01/2012   Procedure: POSTERIOR CERVICAL FUSION/FORAMINOTOMY LEVEL 1;  Surgeon: Hosie Spangle, MD;  Location: [place] [facility];  Service: Neurosurgery;  Laterality: Right;  Right Cervical seven thoracic one Cervical laminectomy/foraminotomy with posterior cervical arthrodesis   . POSTERIOR LUMBAR FUSION N/A 05/23/2013   Procedure: exploration of lumbar wound, explantation of left interbody implant.;  Surgeon: Hosie Spangle, MD;  Location: [place];  Service: Neurosurgery;  Laterality: N/A;  . RECTAL SURGERY     Fissure  . VITRECTOMY     right and left 2013    Family History  Problem Relation Age of Onset  . Dementia Mother   . Anemia Father   . Heart disease Father   . Stroke Sister        ICH  . Lung disease Sister     Social History   Tobacco Use  . Smoking status: Former Smoker    Packs/day: 1.00    Years: 33.00    Pack years: 33.00    Last attempt to quit: 09/29/1981    Years since quitting: 36.4  . Smokeless tobacco: Never Used  Substance Use Topics  . Alcohol use: Yes    Alcohol/week: 1.8 oz    Types: 3 Standard drinks or equivalent per week    Comment: Social    Subjective:  Patient was seen at his neurosurgeon's office today for routine follow-up; his blood pressure was noted to be extremely high; in reviewing his notes, his blood pressure has actually been elevated for the past few months; he readily admits he is  overdue to see his PCP about this; Denies any chest pain, shortness of breath, blurred vision or headache.    Objective:  Vitals:   03/12/18 1608  BP: (!) 192/100  Pulse: 80  Temp: 98.3 F (36.8 C)  TempSrc: Oral  SpO2: 96%  Weight: 207 lb 0.6 oz (93.9 kg)  Height: 6' (1.829 m)    General: Well developed, well nourished, in no acute distress  Skin : Warm and dry. Open wound noted on left lower extremity Head: Normocephalic and atraumatic  Lungs: Respirations unlabored; clear to auscultation bilaterally without wheeze, rales, rhonchi  CVS exam: normal rate and regular rhythm.  Extremities: No edema, cyanosis, clubbing  Vessels: Symmetric bilaterally  Neurologic: Alert and oriented; speech intact; face symmetrical; moves all extremities well; CNII-XII intact without focal deficit  Assessment:  1. Hypertension, unspecified type     Plan:  1. Uncontrolled; EKG done today does not show acute changes/ ? LVH; discussed options for managing blood pressure and he did not do well on Amlodipine/ defers fluid pill; notes he rarely uses Lasix and does not want to take daily; continue Losartan 100 mg daily/ Coreg bid; add Hydralazine 25 mg bid for now- can increase to tid if needed; he is due for updated labs and wants to talk to his PCP next week.  Patient will schedule a follow-up with his PCP for 1 week.   Return in about 1 week (around 03/19/2018) for Dr. Sharlet Salina.  Orders Placed This Encounter  Procedures  . EKG 12-Lead    Requested Prescriptions   Signed Prescriptions Disp Refills  . hydrALAZINE (APRESOLINE) 25 MG tablet 90 tablet 1    Sig: Take 1 tablet (25 mg total) by mouth 2 (two) times daily. Increase to tid when directed

## 2018-03-12 NOTE — Telephone Encounter (Signed)
Please advise per Dr. Crawford's Absence. Thank you  

## 2018-03-12 NOTE — Telephone Encounter (Signed)
Seen in office today  

## 2018-03-12 NOTE — Telephone Encounter (Signed)
Copied from [place] [id number]. Topic: Quick Communication - See Telephone Encounter >> Mar 12, 2018  3:13 PM Vernona Rieger wrote: CRM for notification. See Telephone encounter for: 03/12/18.  Winfred Burn, RN with Dr Donnella Bi office called and said he was in today and they took his blood pressure 3 times. The first time is was 205/91 with a heart rate of 80, the second time she took it was 201/93 with heart rate of 82 and the last time she took it manually and it was 206/103 with a heart rate of 88. Dr Sherwood Gambler wanted Dr Sharlet Salina to be aware of this.  [phone], she would like a call back.

## 2018-03-13 ENCOUNTER — Ambulatory Visit: Payer: Medicare Other | Admitting: Family

## 2018-03-19 ENCOUNTER — Ambulatory Visit (INDEPENDENT_AMBULATORY_CARE_PROVIDER_SITE_OTHER): Payer: Medicare Other | Admitting: Internal Medicine

## 2018-03-19 ENCOUNTER — Other Ambulatory Visit (INDEPENDENT_AMBULATORY_CARE_PROVIDER_SITE_OTHER): Payer: Medicare Other

## 2018-03-19 ENCOUNTER — Encounter: Payer: Self-pay | Admitting: Internal Medicine

## 2018-03-19 VITALS — BP 134/64 | HR 79 | Temp 98.1°F | Ht 72.0 in | Wt 205.0 lb

## 2018-03-19 DIAGNOSIS — I10 Essential (primary) hypertension: Secondary | ICD-10-CM

## 2018-03-19 DIAGNOSIS — S81802A Unspecified open wound, left lower leg, initial encounter: Secondary | ICD-10-CM | POA: Insufficient documentation

## 2018-03-19 DIAGNOSIS — R7301 Impaired fasting glucose: Secondary | ICD-10-CM

## 2018-03-19 DIAGNOSIS — Z7952 Long term (current) use of systemic steroids: Secondary | ICD-10-CM | POA: Diagnosis not present

## 2018-03-19 LAB — CBC
HCT: 39.7 % (ref 39.0–52.0)
Hemoglobin: 13.3 g/dL (ref 13.0–17.0)
MCHC: 33.4 g/dL (ref 30.0–36.0)
MCV: 85.7 fl (ref 78.0–100.0)
Platelets: 246 10*3/uL (ref 150.0–400.0)
RBC: 4.64 Mil/uL (ref 4.22–5.81)
RDW: 15 % (ref 11.5–15.5)
WBC: 10.5 10*3/uL (ref 4.0–10.5)

## 2018-03-19 LAB — COMPREHENSIVE METABOLIC PANEL
ALT: 26 U/L (ref 0–53)
AST: 17 U/L (ref 0–37)
Albumin: 3.5 g/dL (ref 3.5–5.2)
Alkaline Phosphatase: 73 U/L (ref 39–117)
BUN: 35 mg/dL — ABNORMAL HIGH (ref 6–23)
CO2: 28 mEq/L (ref 19–32)
Calcium: 8.7 mg/dL (ref 8.4–10.5)
Chloride: 107 mEq/L (ref 96–112)
Creatinine, Ser: 1.27 mg/dL (ref 0.40–1.50)
GFR: 57.34 mL/min — ABNORMAL LOW (ref >60.00)
Glucose, Bld: 166 mg/dL — ABNORMAL HIGH (ref 70–99)
Potassium: 4.6 mEq/L (ref 3.5–5.1)
Sodium: 140 mEq/L (ref 135–145)
Total Bilirubin: 0.9 mg/dL (ref 0.2–1.2)
Total Protein: 5.6 g/dL — ABNORMAL LOW (ref 6.0–8.3)

## 2018-03-19 LAB — HEMOGLOBIN A1C: Hgb A1c MFr Bld: 6.2 % (ref 4.6–6.5)

## 2018-03-19 LAB — LIPID PANEL
Cholesterol: 201 mg/dL — ABNORMAL HIGH (ref 0–200)
HDL: 67.8 mg/dL (ref >39.00)
LDL Cholesterol: 107 mg/dL — ABNORMAL HIGH (ref 0–99)
NonHDL: 132.89
Total CHOL/HDL Ratio: 3
Triglycerides: 127 mg/dL (ref 0.0–149.0)
VLDL: 25.4 mg/dL (ref 0.0–40.0)

## 2018-03-19 NOTE — Assessment & Plan Note (Signed)
Taking hydralazine BID and coreg BID and losartan. Checking CMP today and adjust as needed. BP at goal.

## 2018-03-19 NOTE — Assessment & Plan Note (Signed)
Seeing derm and not healing. He states that they are giving him prednisone to help this heal. We talked about wound clinic for debridement or [place] for blood flow assessment. Advised to take multivitamin daily to help with healing.

## 2018-03-19 NOTE — Progress Notes (Signed)
   Subjective:    Patient ID: [name], male    DOB: 07-03-1932, 84 y.o.   MRN: [id number]  HPI The patient is an 84 YO man coming in for follow up. Seen last week for high blood pressure and starting on hydralazine. He has started this and BP has come down. He is also taking coreg and losartan. He denies chest pains or SOB. Denies side effects of new medications. Getting 100 oxycodone from neurosurgery per month.   Review of Systems  Constitutional: Negative.   HENT: Negative.   Eyes: Negative.   Respiratory: Negative for cough, chest tightness and shortness of breath.   Cardiovascular: Negative for chest pain, palpitations and leg swelling.  Gastrointestinal: Negative for abdominal distention, abdominal pain, constipation, diarrhea, nausea and vomiting.  Musculoskeletal: Negative.   Skin: Negative.   Neurological: Negative.   Psychiatric/Behavioral: Negative.       Objective:   Physical Exam  Constitutional: He is oriented to person, place, and time. He appears well-developed and well-nourished.  HENT:  Head: Normocephalic and atraumatic.  Eyes: EOM are normal.  Neck: Normal range of motion.  Cardiovascular: Normal rate and regular rhythm.  Pulmonary/Chest: Effort normal and breath sounds normal. No respiratory distress. He has no wheezes. He has no rales.  Abdominal: Soft. Bowel sounds are normal. He exhibits no distension. There is no tenderness. There is no rebound.  Musculoskeletal: He exhibits no edema.  Neurological: He is alert and oriented to person, place, and time. Coordination normal.  Skin: Skin is warm and dry.  Wound on the left lower leg circular without callous formation, no abscess  Psychiatric: He has a normal mood and affect.   Vitals:   03/19/18 0904  BP: 134/64  Pulse: 79  Temp: 98.1 F (36.7 C)  TempSrc: Oral  SpO2: 95%  Weight: 205 lb (93 kg)  Height: 6' (1.829 m)      Assessment & Plan:

## 2018-03-19 NOTE — Patient Instructions (Addendum)
Keep the hydralazine to twice a day.   Take a mens daily multivitamin to help with the wound to heal.   We are checking the blood work today.

## 2018-03-25 DIAGNOSIS — M4726 Other spondylosis with radiculopathy, lumbar region: Secondary | ICD-10-CM | POA: Diagnosis not present

## 2018-03-25 DIAGNOSIS — M5136 Other intervertebral disc degeneration, lumbar region: Secondary | ICD-10-CM | POA: Diagnosis not present

## 2018-03-25 DIAGNOSIS — M5416 Radiculopathy, lumbar region: Secondary | ICD-10-CM | POA: Diagnosis not present

## 2018-03-25 DIAGNOSIS — Z981 Arthrodesis status: Secondary | ICD-10-CM | POA: Diagnosis not present

## 2018-03-25 DIAGNOSIS — I10 Essential (primary) hypertension: Secondary | ICD-10-CM | POA: Diagnosis not present

## 2018-03-25 DIAGNOSIS — M5440 Lumbago with sciatica, unspecified side: Secondary | ICD-10-CM | POA: Diagnosis not present

## 2018-03-25 DIAGNOSIS — Z6828 Body mass index (BMI) 28.0-28.9, adult: Secondary | ICD-10-CM | POA: Diagnosis not present

## 2018-03-31 DIAGNOSIS — L080 Pyoderma: Secondary | ICD-10-CM | POA: Diagnosis not present

## 2018-04-01 ENCOUNTER — Other Ambulatory Visit: Payer: Self-pay | Admitting: Internal Medicine

## 2018-04-07 ENCOUNTER — Ambulatory Visit (INDEPENDENT_AMBULATORY_CARE_PROVIDER_SITE_OTHER): Payer: Medicare Other | Admitting: Sports Medicine

## 2018-04-07 ENCOUNTER — Ambulatory Visit (INDEPENDENT_AMBULATORY_CARE_PROVIDER_SITE_OTHER): Payer: Medicare Other

## 2018-04-07 ENCOUNTER — Encounter: Payer: Self-pay | Admitting: Sports Medicine

## 2018-04-07 VITALS — BP 150/64 | HR 88 | Ht 72.0 in | Wt 208.6 lb

## 2018-04-07 DIAGNOSIS — M25551 Pain in right hip: Secondary | ICD-10-CM

## 2018-04-07 DIAGNOSIS — M47817 Spondylosis without myelopathy or radiculopathy, lumbosacral region: Secondary | ICD-10-CM | POA: Diagnosis not present

## 2018-04-07 DIAGNOSIS — M1611 Unilateral primary osteoarthritis, right hip: Secondary | ICD-10-CM | POA: Diagnosis not present

## 2018-04-07 DIAGNOSIS — Z7952 Long term (current) use of systemic steroids: Secondary | ICD-10-CM

## 2018-04-07 DIAGNOSIS — S81802A Unspecified open wound, left lower leg, initial encounter: Secondary | ICD-10-CM

## 2018-04-07 DIAGNOSIS — M47816 Spondylosis without myelopathy or radiculopathy, lumbar region: Secondary | ICD-10-CM | POA: Diagnosis not present

## 2018-04-07 MED ORDER — BACLOFEN 10 MG PO TABS: 10.0000 mg | ORAL_TABLET | Freq: Three times a day (TID) | ORAL | 1 refills | Status: DC | PRN

## 2018-04-07 MED ORDER — METHYLPREDNISOLONE ACETATE 80 MG/ML IJ SUSP
80.0000 mg | Freq: Once | INTRAMUSCULAR | Status: AC
  Administered 2018-04-07: 80 mg via INTRAMUSCULAR

## 2018-04-07 MED ORDER — KETOROLAC TROMETHAMINE 60 MG/2ML IM SOLN
60.0000 mg | Freq: Once | INTRAMUSCULAR | Status: AC
  Administered 2018-04-07: 60 mg via INTRAMUSCULAR

## 2018-04-07 NOTE — Patient Instructions (Signed)
Meridee Score is at [place] and is a foot and ankle specialist who does a lot of wound care

## 2018-04-07 NOTE — Progress Notes (Signed)
[name] [name], [name] - 84 y.o. male MRN [id number]  Date of birth: [birth year]  Visit Date: 04/07/2018  PCP: Hoyt Koch, MD   Referred by: Hoyt Koch, *  Scribe(s) for today's visit: Josepha Pigg, CMA  SUBJECTIVE:  [name] is here for R hip pain   His R hip pain symptoms INITIALLY: Began about 3 days ago and has gotten worse last night. Hx of lumbar surgery. He has sciatica in L leg.  Described as moderate stabbing at rest, can be severe with activity, radiating to the R leg.  Worsened with activity.  Improved with rest.  Additional associated symptoms include: He denies groin pain. Hip pain is lateral. He denies clicking, popping, catching in L hip. He also c/o LBP.     At this time symptoms are worsening compared to onset. He has been taking Oxycodone for his L leg sciatica, this does not seem to help with R hip pain.   CT/MRI L spine 2015   REVIEW OF SYSTEMS: Reports night time disturbances. Denies fevers, chills, or night sweats. Denies unexplained weight loss. Reports personal history of cancer. Denies changes in bowel or bladder habits. Denies recent unreported falls. Denies new or worsening dyspnea or wheezing. Denies headaches or dizziness.  Denies numbness, tingling or weakness  In the extremities.  Denies dizziness or presyncopal episodes Denies lower extremity edema     HISTORY & PERTINENT PRIOR DATA:  Significant/pertinent history, findings, studies include:  reports that he quit smoking about 36 years ago. He has a 33.00 pack-year smoking history. He has never used smokeless tobacco. Recent Labs    03/19/18 0932  HGBA1C 6.2   09/10/2017 - Orthovisc is covered by Medicare 80/20, no copay, deductible has been met. [employer] will cover 20% coinsurance from Medicare. Buy and Bill only, not covered under pharmacy benefits.   05/22/17 -  orthovisc is covered, coinsurance is 20% of the allowable amount. No problems updated.  Otherwise prior history reviewed and updated per electronic medical record.   OBJECTIVE:  VS:  HT:6' (182.9 cm)   WT:208 lb 9.6 oz (94.6 kg)  BMI:28.28    BP:(Abnormal) 150/64  HR:88bpm  TEMP: ( )  RESP:96 %   PHYSICAL EXAM: CONSTITUTIONAL: Well-developed, Well-nourished and In no acute distress Alert & appropriately interactive. and Not depressed or anxious appearing. RESPIRATORY: No increased work of breathing and Trachea Midline EYES: Pupils are equal., EOM intact without nystagmus. and No scleral icterus.  Lower extremities: Warm and well perfused Pulses: DP Pulses: Bilaterally normal and symmetric PT Pulses: Bilaterally normal and symmetric Edema: No Pre-tibial edema and No significant swelling or edema NEURO: unremarkable  MSK Exam: BACK Exam: Normal alignment & Contours  Postsurgical incisions, well-healed, no overlying erythema. Skin: No overlying erythema/ecchymosis  MOTOR TESTING: Intact in all LE myotomes and Able to heel and toe walk without difficutly Bilateral paraspinal muscle spasms with most focal pain over the right gluteal musculature.  No focal tenderness over the greater trochanter.  Good internal and external rotation of bilateral hips.       RIGHT    LEFT Straight leg raise-------------------------: normal, no pain                         normal, no pain Braggard Stretch Test------------------: normal, no pain  normal, no pain Slump Sign--------------------------------: normal, no pain                         normal, no pain Popliteal compression test------------: normal, no pain                         normal, no pain Greater sciatic notch tenderness----: normal, no pain                         normal, no pain   REFLEXES Right Left  DTR - L3/4 -Patellar 2+ 2+  DTR - L5/S1 - Achilles 2+ 2+     PROCEDURES & DATA REVIEWED:   . X-Rays obtained today and reviewed with the patient that showed: Overall well-maintained hip spaces.  He does have some changes that are marked within the lumbar spine with significant loss of L4-5 as well as L5-S1 which is postsurgical.  Radiology does read this as possible discitis although no focal bony midline tenderness and no fevers, chills or worsening symptoms other than pain.  No neurologic symptoms appreciated.  ASSESSMENT   1. Right hip pain   2. Wound of left lower extremity, initial encounter   3. Spondylosis of lumbosacral region without myelopathy or radiculopathy   4. Current chronic use of systemic steroids     PLAN:       . Please see procedure section and notes. Marland Kitchen Ultimately I believe this is coming from withdrawal of systemic steroids that has been altered for the healed chronic left shin wound as well as Mohs surgery from his skull.  The findings on x-ray today are concerning for potential discitis although he does not have any focal midline pain and no overlying erythema or systemic features.  Given he has been on chronic steroid immunosuppression I does put him at increased risk of this and he will need to undergo MRI with and without contrast of the lumbar spine.  This has been ordered and will be obtained later this week.  Red flag symptoms have been reviewed with him and overall he is feeling better after the Depo-Medrol injection that was provided to him in clinic today at this office visit.  This note was written later in the week.  He is actually having some improvement following steroid use which does confirm this is likely iatrogenic and consideration of working up a adrenal insufficiency syndrome given the iatrogenic cause of this could be considered. . Additionally Dr. Meridee Score at [facility] specializes in foot wounds and leg wounds and if any lack of improvement could be referred there for second opinion. . We will plan to follow-up with him after the  MRI is obtained and have recommended he follow-up with his PCP regarding work-up of possible adrenal insufficiency from iatrogenic use versus consideration of resuming low-dose systemic corticosteroids. . Muscle relaxer provided today for symptomatic relief(baclofen)  No problem-specific Assessment & Plan notes found for this encounter.  Follow-up: Return if symptoms worsen or fail to improve.      Please see additional documentation for Objective, Assessment and Plan sections. Pertinent additional documentation may be included in corresponding procedure notes, imaging studies, problem based documentation and patient instructions. Please see these sections of the encounter for additional information regarding this visit.  CMA/ATC served as Education administrator during this visit. History, Physical, and Plan performed by medical provider. Documentation and orders reviewed and attested to.  Yerik Zeringue D Alayjah Boehringer, DO    [facility] Phys[facility]   

## 2018-04-08 ENCOUNTER — Other Ambulatory Visit: Payer: Self-pay

## 2018-04-08 DIAGNOSIS — M4647 Discitis, unspecified, lumbosacral region: Secondary | ICD-10-CM

## 2018-04-08 DIAGNOSIS — M5136 Other intervertebral disc degeneration, lumbar region: Secondary | ICD-10-CM

## 2018-04-08 NOTE — Telephone Encounter (Signed)
[name] called with a STAT report on the patient. Please advise if needed.

## 2018-04-08 NOTE — Progress Notes (Signed)
X-ray concerning for potential discitis/osteomyelitis of L5-S1 which is at his prior surgical site.  Diagnostic evaluation with MRI of the lumbar spine will need to be pursued with and without contrast.  Clinically he was less tender over this region and it was over the gluteal musculature and given the recent steroid use suspect this is coming more from a steroid withdrawal on.  Other way given the findings of diagnostic evaluation will need to be pursued 24 to 48 hours given the lack of reflex symptoms on exam.  Team, please place MRI with and without contrast of the lumbar spine ordered stat

## 2018-04-09 ENCOUNTER — Other Ambulatory Visit: Payer: Self-pay

## 2018-04-09 MED ORDER — HYDRALAZINE HCL 25 MG PO TABS: 25.0000 mg | ORAL_TABLET | Freq: Two times a day (BID) | ORAL | 3 refills | Status: DC

## 2018-04-11 ENCOUNTER — Encounter: Payer: Self-pay | Admitting: Sports Medicine

## 2018-04-13 ENCOUNTER — Ambulatory Visit
Admission: RE | Admit: 2018-04-13 | Discharge: 2018-04-13 | Disposition: A | Discharge status: Home / Self Care | Payer: Medicare Other | Source: Ambulatory Visit | Attending: Sports Medicine | Admitting: Sports Medicine

## 2018-04-13 DIAGNOSIS — M48061 Spinal stenosis, lumbar region without neurogenic claudication: Secondary | ICD-10-CM | POA: Diagnosis not present

## 2018-04-13 DIAGNOSIS — M5136 Other intervertebral disc degeneration, lumbar region: Secondary | ICD-10-CM

## 2018-04-13 DIAGNOSIS — M4647 Discitis, unspecified, lumbosacral region: Secondary | ICD-10-CM

## 2018-04-13 MED ORDER — GADOBENATE DIMEGLUMINE 529 MG/ML IV SOLN
19.0000 mL | Freq: Once | INTRAVENOUS | Status: AC | PRN
  Administered 2018-04-13: 19 mL via INTRAVENOUS

## 2018-04-14 ENCOUNTER — Encounter: Payer: Self-pay | Admitting: Internal Medicine

## 2018-04-17 ENCOUNTER — Encounter: Payer: Self-pay | Admitting: Sports Medicine

## 2018-04-23 DIAGNOSIS — M5136 Other intervertebral disc degeneration, lumbar region: Secondary | ICD-10-CM | POA: Diagnosis not present

## 2018-04-23 DIAGNOSIS — M4726 Other spondylosis with radiculopathy, lumbar region: Secondary | ICD-10-CM | POA: Diagnosis not present

## 2018-04-23 DIAGNOSIS — M48062 Spinal stenosis, lumbar region with neurogenic claudication: Secondary | ICD-10-CM | POA: Diagnosis not present

## 2018-04-23 DIAGNOSIS — Z981 Arthrodesis status: Secondary | ICD-10-CM | POA: Diagnosis not present

## 2018-04-23 DIAGNOSIS — M5416 Radiculopathy, lumbar region: Secondary | ICD-10-CM | POA: Diagnosis not present

## 2018-04-28 DIAGNOSIS — M4726 Other spondylosis with radiculopathy, lumbar region: Secondary | ICD-10-CM | POA: Diagnosis not present

## 2018-04-28 DIAGNOSIS — M5136 Other intervertebral disc degeneration, lumbar region: Secondary | ICD-10-CM | POA: Diagnosis not present

## 2018-04-28 DIAGNOSIS — M48061 Spinal stenosis, lumbar region without neurogenic claudication: Secondary | ICD-10-CM | POA: Diagnosis not present

## 2018-05-07 DIAGNOSIS — L570 Actinic keratosis: Secondary | ICD-10-CM | POA: Diagnosis not present

## 2018-05-07 DIAGNOSIS — Z85828 Personal history of other malignant neoplasm of skin: Secondary | ICD-10-CM | POA: Diagnosis not present

## 2018-05-07 DIAGNOSIS — L080 Pyoderma: Secondary | ICD-10-CM | POA: Diagnosis not present

## 2018-05-12 ENCOUNTER — Other Ambulatory Visit: Payer: Self-pay | Admitting: Internal Medicine

## 2018-05-23 ENCOUNTER — Encounter: Payer: Self-pay | Admitting: Internal Medicine

## 2018-05-23 MED ORDER — DICLOFENAC SODIUM 75 MG PO TBEC: 75.0000 mg | DELAYED_RELEASE_TABLET | Freq: Two times a day (BID) | ORAL | 0 refills | Status: DC

## 2018-06-16 ENCOUNTER — Encounter: Payer: Self-pay | Admitting: Sports Medicine

## 2018-06-19 ENCOUNTER — Other Ambulatory Visit: Payer: Self-pay | Admitting: Internal Medicine

## 2018-06-19 ENCOUNTER — Ambulatory Visit: Payer: Medicare Other | Admitting: Internal Medicine

## 2018-07-02 ENCOUNTER — Encounter: Payer: Self-pay | Admitting: Internal Medicine

## 2018-07-04 MED ORDER — PREDNISONE 20 MG PO TABS: 40.0000 mg | ORAL_TABLET | Freq: Every day | ORAL | 0 refills | Status: DC

## 2018-07-04 NOTE — Addendum Note (Signed)
Addended by: Pricilla Holm A on: 07/04/2018 09:22 AM   Modules accepted: Orders

## 2018-07-21 ENCOUNTER — Other Ambulatory Visit: Payer: Self-pay | Admitting: Internal Medicine

## 2018-07-29 DIAGNOSIS — Z981 Arthrodesis status: Secondary | ICD-10-CM | POA: Diagnosis not present

## 2018-07-29 DIAGNOSIS — M5416 Radiculopathy, lumbar region: Secondary | ICD-10-CM | POA: Diagnosis not present

## 2018-07-29 DIAGNOSIS — I10 Essential (primary) hypertension: Secondary | ICD-10-CM | POA: Diagnosis not present

## 2018-07-29 DIAGNOSIS — Z6828 Body mass index (BMI) 28.0-28.9, adult: Secondary | ICD-10-CM | POA: Diagnosis not present

## 2018-07-29 DIAGNOSIS — M4726 Other spondylosis with radiculopathy, lumbar region: Secondary | ICD-10-CM | POA: Diagnosis not present

## 2018-07-29 DIAGNOSIS — M48062 Spinal stenosis, lumbar region with neurogenic claudication: Secondary | ICD-10-CM | POA: Diagnosis not present

## 2018-07-29 DIAGNOSIS — M5136 Other intervertebral disc degeneration, lumbar region: Secondary | ICD-10-CM | POA: Diagnosis not present

## 2018-07-30 ENCOUNTER — Ambulatory Visit: Payer: Self-pay | Admitting: *Deleted

## 2018-07-30 ENCOUNTER — Telehealth: Payer: Self-pay

## 2018-07-30 NOTE — Telephone Encounter (Signed)
Pt calling to report  BP 183/81, 191/95 and 198/110, all taken one hour ago with home monitor. Reports "Mild" headache. Reports 3 days ago had diarrhea, nausea. States "Just feel funny." States he is on his way to practice, "About 10 minutes away."  Attempted to further assess, declined. Explained to pt he could not walk in and be seen, that I would see what was available today; States "I'm 84 years old and Dr. Sharlet Salina is my doctor and I'm coming in now to see her." Explained to patient I would call practice to see what we could set up for him, pt states "You go ahead, I'm coming in anyway."  [place] called practice and spoke to [facility] who will alert staff.   Answer Assessment - Initial Assessment Questions 1. BLOOD PRESSURE: "What is the blood pressure?" "Did you take at least two measurements 5 minutes apart?"      2. ONSET: "When did you take your blood pressure?"      3. HOW: "How did you obtain the blood pressure?" (e.g., visiting nurse, automatic home BP monitor)      4. HISTORY: "Do you have a history of high blood pressure?"     5. MEDICATIONS: "Are you taking any medications for blood pressure?" "Have you missed any doses recently?"     6. OTHER SYMPTOMS: "Do you have any symptoms?" (e.g., headache, chest pain, blurred vision, difficulty breathing, weakness)  Protocols used: HIGH BLOOD PRESSURE-A-AH

## 2018-07-30 NOTE — Telephone Encounter (Signed)
Patient walked into office and requested office visit today for increased blood pressure, has taken bp reading at home and also had it taken at another doctors office visit today, with readings 190's/110's both times----patient has agreed to make appt with dr crawford for 12/19 at 8:20, and per Hollie Beach shambley,NP ok for patient to take one additional hydralazine today to help bring bp reading down---pt advised to continue to monitor his bp readings at home, and to seek urgent or emegency care if he develops any chest pain, shortness of breath--patient repeated back for understanding

## 2018-07-31 ENCOUNTER — Encounter: Payer: Self-pay | Admitting: Internal Medicine

## 2018-07-31 ENCOUNTER — Ambulatory Visit (INDEPENDENT_AMBULATORY_CARE_PROVIDER_SITE_OTHER): Payer: Medicare Other | Admitting: Internal Medicine

## 2018-07-31 DIAGNOSIS — I10 Essential (primary) hypertension: Secondary | ICD-10-CM

## 2018-07-31 MED ORDER — HYDRALAZINE HCL 25 MG PO TABS: ORAL_TABLET | ORAL | 3 refills | Status: DC

## 2018-07-31 MED ORDER — HYDRALAZINE HCL 50 MG PO TABS: 50.0000 mg | ORAL_TABLET | Freq: Two times a day (BID) | ORAL | 3 refills | Status: DC

## 2018-07-31 MED ORDER — PREDNISONE 20 MG PO TABS: 40.0000 mg | ORAL_TABLET | Freq: Every day | ORAL | 0 refills | Status: DC

## 2018-07-31 NOTE — Patient Instructions (Signed)
I have sent in hydralazine to take 1 pill in the morning and 2 pills in the evening to bring the blood pressure back down.

## 2018-07-31 NOTE — Assessment & Plan Note (Signed)
Increase hydralazine to 50 mg BID and continue losartan, coreg. Check back in several months and he will start monitoring at home and call back in 1-2 weeks with readings.

## 2018-07-31 NOTE — Progress Notes (Signed)
   Subjective:    Patient ID: [name], male    DOB: 1932-05-18, 84 y.o.   MRN: [id number]  HPI The patient is an 84 YO man coming in for high blood pressure. Came into office yesterday and was given instructions for taking an extra hydralazine pills to help. BP was running 190/100s yesterday. He denies chest pains but was having some nausea and fatigue over the last couple of weeks. Has not been monitoring at home so does not know how long it has been running high. He denies being out of any medications or skipping doses. He does take coreg BID and hydralazine BID and proscar daily and losartan daily. Denies change in diet major or exercise. Not taking prednisone or otc medications currently.   Review of Systems  Constitutional: Positive for fatigue.  HENT: Negative.   Eyes: Negative.   Respiratory: Negative for cough, chest tightness and shortness of breath.   Cardiovascular: Negative for chest pain, palpitations and leg swelling.  Gastrointestinal: Positive for nausea. Negative for abdominal distention, abdominal pain, constipation, diarrhea and vomiting.  Musculoskeletal: Negative.   Skin: Negative.   Neurological: Negative.   Psychiatric/Behavioral: Negative.       Objective:   Physical Exam Constitutional:      Appearance: He is well-developed.  HENT:     Head: Normocephalic and atraumatic.  Neck:     Musculoskeletal: Normal range of motion.  Cardiovascular:     Rate and Rhythm: Normal rate and regular rhythm.  Pulmonary:     Effort: Pulmonary effort is normal. No respiratory distress.     Breath sounds: Normal breath sounds. No wheezing or rales.  Abdominal:     General: Bowel sounds are normal. There is no distension.     Palpations: Abdomen is soft.     Tenderness: There is no abdominal tenderness. There is no rebound.  Skin:    General: Skin is warm and dry.  Neurological:     Mental Status: He is alert and oriented to person, place, and time.     Coordination:  Coordination normal.    Vitals:   07/31/18 0803  BP: 130/70  Pulse: 82  Temp: 98.3 F (36.8 C)  TempSrc: Oral  SpO2: 96%  Weight: 204 lb (92.5 kg)  Height: 6' (1.829 m)      Assessment & Plan:

## 2018-07-31 NOTE — Telephone Encounter (Signed)
Patient seen today

## 2018-08-03 ENCOUNTER — Encounter: Payer: Self-pay | Admitting: Sports Medicine

## 2018-08-04 ENCOUNTER — Ambulatory Visit: Payer: Self-pay

## 2018-08-04 ENCOUNTER — Encounter: Payer: Self-pay | Admitting: Sports Medicine

## 2018-08-04 ENCOUNTER — Ambulatory Visit (INDEPENDENT_AMBULATORY_CARE_PROVIDER_SITE_OTHER): Payer: Medicare Other | Admitting: Sports Medicine

## 2018-08-04 VITALS — BP 148/76 | HR 78 | Ht 72.0 in | Wt 208.0 lb

## 2018-08-04 DIAGNOSIS — M1712 Unilateral primary osteoarthritis, left knee: Secondary | ICD-10-CM | POA: Diagnosis not present

## 2018-08-04 DIAGNOSIS — G8929 Other chronic pain: Secondary | ICD-10-CM | POA: Diagnosis not present

## 2018-08-04 DIAGNOSIS — M5416 Radiculopathy, lumbar region: Secondary | ICD-10-CM | POA: Diagnosis not present

## 2018-08-04 DIAGNOSIS — M19071 Primary osteoarthritis, right ankle and foot: Secondary | ICD-10-CM

## 2018-08-04 DIAGNOSIS — Z8739 Personal history of other diseases of the musculoskeletal system and connective tissue: Secondary | ICD-10-CM | POA: Diagnosis not present

## 2018-08-04 NOTE — Procedures (Signed)
PROCEDURE NOTE:  Ultrasound Guided: Injection: Left knee Images were obtained and interpreted by myself, Teresa Coombs, DO  Images have been saved and stored to PACS system. Images obtained on: GE S7 Ultrasound machine    ULTRASOUND FINDINGS:  Moderate synovitis with a moderate effusion.  DESCRIPTION OF PROCEDURE:  The patient's clinical condition is marked by substantial pain and/or significant functional disability. Other conservative therapy has not provided relief, is contraindicated, or not appropriate. There is a reasonable likelihood that injection will significantly improve the patient's pain and/or functional impairment.   After discussing the risks, benefits and expected outcomes of the injection and all questions were reviewed and answered, the patient wished to undergo the above named procedure.  Verbal consent was obtained.  The ultrasound was used to identify the target structure and adjacent neurovascular structures. The skin was then prepped in sterile fashion and the target structure was injected under direct visualization using sterile technique as below:  Single injection performed as below: PREP: Alcohol and Ethel Chloride APPROACH:superiolateral, single injection, 25g 1.5 in. INJECTATE: 1 cc 0.5% Marcaine and 1 cc 40mg /mL DepoMedrol ASPIRATE: None DRESSING: Band-Aid  Post procedural instructions including recommending icing and warning signs for infection were reviewed.    This procedure was well tolerated and there were no complications.   IMPRESSION: Succesful Ultrasound Guided: Injection

## 2018-08-04 NOTE — Patient Instructions (Addendum)

## 2018-08-04 NOTE — Procedures (Signed)
PROCEDURE NOTE:  Ultrasound Guided: Injection: Right ankle Images were obtained and interpreted by myself, Teresa Coombs, DO  Images have been saved and stored to PACS system. Images obtained on: GE S7 Ultrasound machine    ULTRASOUND FINDINGS:  Marked degenerative changes with significant synovitis and swelling of the ankle in general.  There is marked degenerative change both midfoot and the ankle.  Overlying subcutaneous edema of the ankle joint.  Tendons appear to be intact.  DESCRIPTION OF PROCEDURE:  The patient's clinical condition is marked by substantial pain and/or significant functional disability. Other conservative therapy has not provided relief, is contraindicated, or not appropriate. There is a reasonable likelihood that injection will significantly improve the patient's pain and/or functional impairment.   After discussing the risks, benefits and expected outcomes of the injection and all questions were reviewed and answered, the patient wished to undergo the above named procedure.  Verbal consent was obtained.  The ultrasound was used to identify the target structure and adjacent neurovascular structures. The skin was then prepped in sterile fashion and the target structure was injected under direct visualization using sterile technique as below:  Single injection performed as below: PREP: Alcohol and Ethel Chloride APPROACH:anteriolateral, single injection, 25g 1.5 in. INJECTATE: 1 cc 0.5% Marcaine and 1 cc 40mg /mL DepoMedrol ASPIRATE: None DRESSING: Band-Aid  Post procedural instructions including recommending icing and warning signs for infection were reviewed.    This procedure was well tolerated and there were no complications.   IMPRESSION: Succesful Ultrasound Guided: Injection

## 2018-08-04 NOTE — Progress Notes (Signed)
[name] Tanveer Brammer, [place] at [facility] - 84 y.o. male MRN [id number]  Date of birth: 11-11-1931  Visit Date: 08/04/2018   PCP: Hoyt Koch, MD   Referred by: Hoyt Koch, *   SUBJECTIVE:  Chief Complaint  Patient presents with  . f/u R foot pain  . f/u L knee pain    L knee inj 07/29/17.     HPI: Patient presents with right foot and ankle pain that has been worsening over the past several days.  He has had worsening left-sided knee pain as well.  He radiates into his left calf.  He has having worsening symptoms with both of these with weightbearing.  He previously has had a Toradol and Depakote 80 injection as well as a left knee injection last year.  Baclofen did cause some confusion and disorientation.  He continues to take his oxycodone daily for left-sided sciatica prescribed by another provider.  He has recently finished a long-term course of steroids.  REVIEW OF SYSTEMS: He has been having nighttime disturbances due to the right foot pain.  HISTORY:  Prior history reviewed and updated per electronic medical record.  Social History   Occupational History  . Occupation: Retired  Tobacco Use  . Smoking status: Former Smoker    Packs/day: 1.00    Years: 33.00    Pack years: 33.00    Last attempt to quit: 09/29/1981    Years since quitting: 37.0  . Smokeless tobacco: Never Used  Substance and Sexual Activity  . Alcohol use: Yes    Alcohol/week: 3.0 standard drinks    Types: 3 Standard drinks or equivalent per week    Comment: Social  . Drug use: No  . Sexual activity: Yes    Partners: Female    Comment: Married   Social History   Social History Narrative   Lives at home w/ his wife   Left-handed   Caffeine: 1 cup coffee daily   1 son alive, 2 sons deceased   Retired Building control surveyor, Furniture conservator/restorer.    Education: GED     Axis:  Recent Labs     03/19/18 0932  08/11/18 0210 08/12/18 0230 08/18/18 1531  HGBA1C 6.2  --   --   --   --   CALCIUM 8.7   < > 8.2* 8.7* 9.6  AST 17  --  16  --  13  ALT 26  --  17  --  12  TSH  --   --  1.779  --   --    < > = values in this interval not displayed.   No problems updated. 09/10/2017 - Orthovisc is covered by Medicare 80/20, no copay, deductible has been met. [employer] will cover 20% coinsurance from Medicare. Buy and Bill only, not covered under pharmacy benefits.   05/22/17 - orthovisc is covered, coinsurance is 20% of the allowable amount.  OBJECTIVE:  VS:  HT:6' (182.9 cm)   WT:208 lb (94.3 kg)  BMI:28.2    BP:(!) 148/76  HR:78bpm  TEMP: ( )  RESP:95 %   PHYSICAL EXAM: Adult male.  No acute distress.  His left knee has moderate amount of crepitation with patellar grind.  He has ligamentously stable knee.  Pain with flexion and extension especially in terminal extension.  His right ankle has moderate amount of anterior lateral swelling and pain over the anterior lateral recess.  Ankle  drawer exam is stable.  Intrinsic ankle strength is intact to manual muscle testing.   ASSESSMENT  1. Primary osteoarthritis of left knee   2. History of gout   3. Chronic radicular low back pain   4. Arthritis of right ankle   5. Primary osteoarthritis of right foot     PLAN:  Pertinent additional documentation may be included in corresponding procedure notes, imaging studies, problem based documentation and patient instructions.  Procedures:  US Guided Injection per procedure note  Medications:  No orders of the defined types were placed in this encounter.   Discussion/Instructions: No problem-specific Assessment & Plan notes found for this encounter.    Discussed appropriate use of both heat and ice with the patient today.  Discussed red flag symptoms that warrant earlier emergent evaluation and patient voices understanding. Activity modifications and the importance of avoiding  exacerbating activities (limiting pain to no more than a 4 / 10 during or following activity) recommended and discussed.   Injection of the right ankle joint and left knee performed today.  Steroids recently discontinued and is likely having a slight flareup following this.  He does have a history of underlying gout but he reports is feeling significantly different than that.  If any persistent symptoms consider Colcrys.  He will follow-up as needed.  If any lack of improvement: consider further diagnostic evaluation with Uric acid level checked.   Return if symptoms worsen or fail to improve.          Gerda Diss, [facility] Physician

## 2018-08-10 ENCOUNTER — Emergency Department (HOSPITAL_COMMUNITY): Payer: Medicare Other

## 2018-08-10 ENCOUNTER — Other Ambulatory Visit: Payer: Self-pay

## 2018-08-10 ENCOUNTER — Observation Stay (HOSPITAL_BASED_OUTPATIENT_CLINIC_OR_DEPARTMENT_OTHER): Payer: Medicare Other

## 2018-08-10 ENCOUNTER — Observation Stay (HOSPITAL_COMMUNITY): Payer: Medicare Other

## 2018-08-10 ENCOUNTER — Encounter (HOSPITAL_COMMUNITY): Payer: Self-pay | Admitting: Emergency Medicine

## 2018-08-10 ENCOUNTER — Observation Stay (HOSPITAL_COMMUNITY)
Admission: EM | Admit: 2018-08-10 | Discharge: 2018-08-12 | Disposition: A | Discharge status: Home / Self Care | Payer: Medicare Other | Attending: Internal Medicine | Admitting: Internal Medicine

## 2018-08-10 DIAGNOSIS — Z79899 Other long term (current) drug therapy: Secondary | ICD-10-CM | POA: Insufficient documentation

## 2018-08-10 DIAGNOSIS — Z8249 Family history of ischemic heart disease and other diseases of the circulatory system: Secondary | ICD-10-CM | POA: Diagnosis not present

## 2018-08-10 DIAGNOSIS — N179 Acute kidney failure, unspecified: Secondary | ICD-10-CM | POA: Insufficient documentation

## 2018-08-10 DIAGNOSIS — I700 Atherosclerosis of aorta: Secondary | ICD-10-CM | POA: Diagnosis not present

## 2018-08-10 DIAGNOSIS — I5032 Chronic diastolic (congestive) heart failure: Secondary | ICD-10-CM | POA: Diagnosis present

## 2018-08-10 DIAGNOSIS — I2510 Atherosclerotic heart disease of native coronary artery without angina pectoris: Secondary | ICD-10-CM | POA: Diagnosis not present

## 2018-08-10 DIAGNOSIS — G8929 Other chronic pain: Secondary | ICD-10-CM | POA: Diagnosis not present

## 2018-08-10 DIAGNOSIS — Z66 Do not resuscitate: Secondary | ICD-10-CM | POA: Diagnosis not present

## 2018-08-10 DIAGNOSIS — M109 Gout, unspecified: Secondary | ICD-10-CM | POA: Diagnosis not present

## 2018-08-10 DIAGNOSIS — Z86718 Personal history of other venous thrombosis and embolism: Secondary | ICD-10-CM | POA: Diagnosis not present

## 2018-08-10 DIAGNOSIS — N400 Enlarged prostate without lower urinary tract symptoms: Secondary | ICD-10-CM | POA: Diagnosis present

## 2018-08-10 DIAGNOSIS — I110 Hypertensive heart disease with heart failure: Secondary | ICD-10-CM | POA: Diagnosis not present

## 2018-08-10 DIAGNOSIS — R944 Abnormal results of kidney function studies: Secondary | ICD-10-CM | POA: Diagnosis not present

## 2018-08-10 DIAGNOSIS — I5033 Acute on chronic diastolic (congestive) heart failure: Secondary | ICD-10-CM | POA: Insufficient documentation

## 2018-08-10 DIAGNOSIS — I160 Hypertensive urgency: Secondary | ICD-10-CM | POA: Diagnosis not present

## 2018-08-10 DIAGNOSIS — K760 Fatty (change of) liver, not elsewhere classified: Secondary | ICD-10-CM | POA: Insufficient documentation

## 2018-08-10 DIAGNOSIS — I5022 Chronic systolic (congestive) heart failure: Secondary | ICD-10-CM | POA: Diagnosis present

## 2018-08-10 DIAGNOSIS — E785 Hyperlipidemia, unspecified: Secondary | ICD-10-CM | POA: Diagnosis present

## 2018-08-10 DIAGNOSIS — I5023 Acute on chronic systolic (congestive) heart failure: Secondary | ICD-10-CM | POA: Diagnosis present

## 2018-08-10 DIAGNOSIS — J90 Pleural effusion, not elsewhere classified: Secondary | ICD-10-CM | POA: Diagnosis not present

## 2018-08-10 DIAGNOSIS — R7989 Other specified abnormal findings of blood chemistry: Secondary | ICD-10-CM

## 2018-08-10 DIAGNOSIS — R0789 Other chest pain: Principal | ICD-10-CM | POA: Insufficient documentation

## 2018-08-10 DIAGNOSIS — I169 Hypertensive crisis, unspecified: Secondary | ICD-10-CM | POA: Diagnosis present

## 2018-08-10 DIAGNOSIS — I10 Essential (primary) hypertension: Secondary | ICD-10-CM | POA: Diagnosis not present

## 2018-08-10 DIAGNOSIS — Z87891 Personal history of nicotine dependence: Secondary | ICD-10-CM | POA: Insufficient documentation

## 2018-08-10 DIAGNOSIS — Z7952 Long term (current) use of systemic steroids: Secondary | ICD-10-CM | POA: Diagnosis not present

## 2018-08-10 DIAGNOSIS — G5603 Carpal tunnel syndrome, bilateral upper limbs: Secondary | ICD-10-CM | POA: Insufficient documentation

## 2018-08-10 DIAGNOSIS — I5031 Acute diastolic (congestive) heart failure: Secondary | ICD-10-CM | POA: Diagnosis present

## 2018-08-10 DIAGNOSIS — R0602 Shortness of breath: Secondary | ICD-10-CM | POA: Diagnosis not present

## 2018-08-10 DIAGNOSIS — Z981 Arthrodesis status: Secondary | ICD-10-CM | POA: Insufficient documentation

## 2018-08-10 DIAGNOSIS — Z85820 Personal history of malignant melanoma of skin: Secondary | ICD-10-CM | POA: Diagnosis not present

## 2018-08-10 DIAGNOSIS — I340 Nonrheumatic mitral (valve) insufficiency: Secondary | ICD-10-CM | POA: Insufficient documentation

## 2018-08-10 DIAGNOSIS — Z7951 Long term (current) use of inhaled steroids: Secondary | ICD-10-CM | POA: Insufficient documentation

## 2018-08-10 LAB — ECHOCARDIOGRAM COMPLETE
Height: 72 in
Weight: 3360 oz

## 2018-08-10 LAB — TROPONIN I
Troponin I: <0.03 ng/mL (ref <0.03)
Troponin I: <0.03 ng/mL (ref <0.03)
Troponin I: <0.03 ng/mL (ref <0.03)

## 2018-08-10 LAB — CBC
HCT: 39.9 % (ref 39.0–52.0)
Hemoglobin: 12.1 g/dL — ABNORMAL LOW (ref 13.0–17.0)
MCH: 28.1 pg (ref 26.0–34.0)
MCHC: 30.3 g/dL (ref 30.0–36.0)
MCV: 92.8 fL (ref 80.0–100.0)
Platelets: 326 10*3/uL (ref 150–400)
RBC: 4.3 MIL/uL (ref 4.22–5.81)
RDW: 13.9 % (ref 11.5–15.5)
WBC: 9.7 10*3/uL (ref 4.0–10.5)
nRBC: 0 % (ref 0.0–0.2)

## 2018-08-10 LAB — URINALYSIS, ROUTINE W REFLEX MICROSCOPIC
Bilirubin Urine: NEGATIVE
Glucose, UA: NEGATIVE mg/dL
Hgb urine dipstick: NEGATIVE
Ketones, ur: NEGATIVE mg/dL
Leukocytes, UA: NEGATIVE
Nitrite: NEGATIVE
Protein, ur: NEGATIVE mg/dL
Specific Gravity, Urine: 1.02 (ref 1.005–1.030)
pH: 8 (ref 5.0–8.0)

## 2018-08-10 LAB — BASIC METABOLIC PANEL
Anion gap: 7 (ref 5–15)
BUN: 29 mg/dL — ABNORMAL HIGH (ref 8–23)
CO2: 27 mmol/L (ref 22–32)
Calcium: 8.5 mg/dL — ABNORMAL LOW (ref 8.9–10.3)
Chloride: 105 mmol/L (ref 98–111)
Creatinine, Ser: 1.33 mg/dL — ABNORMAL HIGH (ref 0.61–1.24)
GFR calc Af Amer: 56 mL/min — ABNORMAL LOW (ref >60)
GFR calc non Af Amer: 49 mL/min — ABNORMAL LOW (ref >60)
Glucose, Bld: 125 mg/dL — ABNORMAL HIGH (ref 70–99)
Potassium: 4.7 mmol/L (ref 3.5–5.1)
Sodium: 139 mmol/L (ref 135–145)

## 2018-08-10 LAB — CREATININE, URINE, RANDOM: Creatinine, Urine: 22.35 mg/dL

## 2018-08-10 LAB — BRAIN NATRIURETIC PEPTIDE: B Natriuretic Peptide: 376.7 pg/mL — ABNORMAL HIGH (ref 0.0–100.0)

## 2018-08-10 LAB — D-DIMER, QUANTITATIVE: D-Dimer, Quant: 1.17 ug/mL-FEU — ABNORMAL HIGH (ref 0.00–0.50)

## 2018-08-10 LAB — SODIUM, URINE, RANDOM: Sodium, Ur: 97 mmol/L

## 2018-08-10 MED ORDER — ASPIRIN 81 MG PO CHEW
324.0000 mg | CHEWABLE_TABLET | Freq: Once | ORAL | Status: AC
  Administered 2018-08-10: 324 mg via ORAL
  Filled 2018-08-10: qty 4

## 2018-08-10 MED ORDER — SODIUM CHLORIDE 0.9% FLUSH
3.0000 mL | Freq: Two times a day (BID) | INTRAVENOUS | Status: DC
  Administered 2018-08-10 – 2018-08-12 (×3): 3 mL via INTRAVENOUS

## 2018-08-10 MED ORDER — ONDANSETRON HCL 4 MG/2ML IJ SOLN: 4.0000 mg | Freq: Four times a day (QID) | INTRAMUSCULAR | Status: DC | PRN

## 2018-08-10 MED ORDER — ACETAMINOPHEN 650 MG RE SUPP: 650.0000 mg | Freq: Four times a day (QID) | RECTAL | Status: DC | PRN

## 2018-08-10 MED ORDER — HYDROCODONE-ACETAMINOPHEN 5-325 MG PO TABS
1.0000 | ORAL_TABLET | ORAL | Status: DC | PRN
  Administered 2018-08-10 – 2018-08-12 (×6): 2 via ORAL
  Filled 2018-08-10 (×6): qty 2

## 2018-08-10 MED ORDER — TAMSULOSIN HCL 0.4 MG PO CAPS
0.4000 mg | ORAL_CAPSULE | ORAL | Status: DC
  Administered 2018-08-11 – 2018-08-12 (×2): 0.4 mg via ORAL
  Filled 2018-08-10 (×2): qty 1

## 2018-08-10 MED ORDER — SODIUM CHLORIDE 0.9 % IV SOLN
250.0000 mL | INTRAVENOUS | Status: DC | PRN
  Administered 2018-08-10: 250 mL via INTRAVENOUS

## 2018-08-10 MED ORDER — FUROSEMIDE 10 MG/ML IJ SOLN
40.0000 mg | Freq: Two times a day (BID) | INTRAMUSCULAR | Status: DC
  Administered 2018-08-10: 40 mg via INTRAVENOUS
  Filled 2018-08-10 (×2): qty 4

## 2018-08-10 MED ORDER — NITROGLYCERIN IN D5W 200-5 MCG/ML-% IV SOLN
0.0000 ug/min | INTRAVENOUS | Status: DC
  Administered 2018-08-10: 5 ug/min via INTRAVENOUS
  Filled 2018-08-10: qty 250

## 2018-08-10 MED ORDER — ACETAMINOPHEN 325 MG PO TABS: 650.0000 mg | ORAL_TABLET | Freq: Four times a day (QID) | ORAL | Status: DC | PRN

## 2018-08-10 MED ORDER — CARVEDILOL 25 MG PO TABS
25.0000 mg | ORAL_TABLET | Freq: Two times a day (BID) | ORAL | Status: DC
  Administered 2018-08-10 – 2018-08-12 (×4): 25 mg via ORAL
  Filled 2018-08-10 (×4): qty 1

## 2018-08-10 MED ORDER — FLUTICASONE PROPIONATE 50 MCG/ACT NA SUSP
1.0000 | Freq: Every evening | NASAL | Status: DC
  Administered 2018-08-10 – 2018-08-11 (×2): 1 via NASAL
  Filled 2018-08-10: qty 16

## 2018-08-10 MED ORDER — ENOXAPARIN SODIUM 30 MG/0.3ML ~~LOC~~ SOLN
30.0000 mg | SUBCUTANEOUS | Status: DC
  Administered 2018-08-10: 30 mg via SUBCUTANEOUS
  Filled 2018-08-10: qty 0.3

## 2018-08-10 MED ORDER — SODIUM CHLORIDE 0.9% FLUSH: 3.0000 mL | INTRAVENOUS | Status: DC | PRN

## 2018-08-10 MED ORDER — ASPIRIN EC 81 MG PO TBEC
81.0000 mg | DELAYED_RELEASE_TABLET | Freq: Every day | ORAL | Status: DC
  Administered 2018-08-11 – 2018-08-12 (×2): 81 mg via ORAL
  Filled 2018-08-10 (×2): qty 1

## 2018-08-10 MED ORDER — HYDRALAZINE HCL 50 MG PO TABS
50.0000 mg | ORAL_TABLET | Freq: Two times a day (BID) | ORAL | Status: DC
  Administered 2018-08-10 – 2018-08-11 (×3): 50 mg via ORAL
  Filled 2018-08-10: qty 2
  Filled 2018-08-10 (×2): qty 1

## 2018-08-10 MED ORDER — OXYCODONE HCL 5 MG PO TABS
10.0000 mg | ORAL_TABLET | Freq: Three times a day (TID) | ORAL | Status: DC | PRN
  Administered 2018-08-10 – 2018-08-11 (×4): 10 mg via ORAL
  Filled 2018-08-10 (×4): qty 2

## 2018-08-10 MED ORDER — ONDANSETRON HCL 4 MG PO TABS: 4.0000 mg | ORAL_TABLET | Freq: Four times a day (QID) | ORAL | Status: DC | PRN

## 2018-08-10 MED ORDER — LABETALOL HCL 5 MG/ML IV SOLN
10.0000 mg | Freq: Once | INTRAVENOUS | Status: AC
  Administered 2018-08-10: 10 mg via INTRAVENOUS
  Filled 2018-08-10: qty 4

## 2018-08-10 MED ORDER — FINASTERIDE 5 MG PO TABS
5.0000 mg | ORAL_TABLET | Freq: Every evening | ORAL | Status: DC
  Administered 2018-08-10 – 2018-08-11 (×2): 5 mg via ORAL
  Filled 2018-08-10 (×2): qty 1

## 2018-08-10 MED ORDER — LEVALBUTEROL HCL 0.63 MG/3ML IN NEBU: 0.6300 mg | INHALATION_SOLUTION | Freq: Four times a day (QID) | RESPIRATORY_TRACT | Status: DC | PRN

## 2018-08-10 MED ORDER — HYDRALAZINE HCL 20 MG/ML IJ SOLN
10.0000 mg | Freq: Once | INTRAMUSCULAR | Status: AC
  Administered 2018-08-10: 10 mg via INTRAVENOUS
  Filled 2018-08-10: qty 1

## 2018-08-10 MED ORDER — ENOXAPARIN SODIUM 40 MG/0.4ML ~~LOC~~ SOLN
40.0000 mg | SUBCUTANEOUS | Status: DC
  Administered 2018-08-11: 40 mg via SUBCUTANEOUS
  Filled 2018-08-10: qty 0.4

## 2018-08-10 MED ORDER — IOPAMIDOL (ISOVUE-370) INJECTION 76%
100.0000 mL | Freq: Once | INTRAVENOUS | Status: AC | PRN
  Administered 2018-08-10: 100 mL via INTRAVENOUS

## 2018-08-10 NOTE — ED Provider Notes (Signed)
[facility] Emergency Department Provider Note MRN:  [id number]  Arrival date & time: 08/10/18     Chief Complaint   Chest pressure History of Present Illness   [name] is a 84 y.o. year-old male with a history of DVT, CAD presenting to the ED with chief complaint of chest pressure.  1 to 2 weeks of persistent pressure sensation in the chest as well as shortness of breath.  Denies chest pain, only pressure.  Does not seem to be related to exertion.  Shortness of breath described more as the sensation of having to catch his breath with a deep breath intermittently.  Denies fever, no cold-like symptoms, no cough.  Also has been having issues with his blood pressure, which has been elevated.  No change in symptoms this morning, here because his blood pressure was in the 010U systolic.  Recently increased blood pressure medication by PCP 5 days ago.  Denies abdominal pain, no headache or vision change, no numbness or weakness to the arms or legs.  Review of Systems  A complete 10 system review of systems was obtained and all systems are negative except as noted in the HPI and PMH.   Patient's Health History    Past Medical History:  Diagnosis Date  . ACTINIC KERATOSIS, HEAD 03/23/2008  . ALLERGIC RHINITIS 03/27/2007  . Arthritis   . BENIGN PROSTATIC HYPERTROPHY, WITH URINARY OBSTRUCTION 10/16/2007  . CAD 08/18/2007   "patient denies any issues with heart, does not see cardiologist"  . Carpal tunnel syndrome, bilateral 09/04/2016  . CARPAL TUNNEL SYNDROME, LEFT 04/13/2008  . Chronic back pain   . Chronic neck pain   . DVT (deep venous thrombosis) ([place])   . ERECTILE DYSFUNCTION, ORGANIC 02/20/2010  . Gout, unspecified 02/20/2010  . Headache(784.0)    migraines hx of  . History of melanoma   . HYPERLIPIDEMIA, WITH HIGH HDL 10/18/2008  . HYPERTENSION 05/09/2007   sees Dr. Benay Pillow  . KNEE PAIN 10/10/2009  . LOW BACK PAIN 03/27/2007  . NEOPLASM, MALIGNANT, SKIN, TRUNK  05/09/2007  . Patellar tendinitis 10/10/2009  . SKIN CANCER, HX OF 03/27/2007  . UNS ADVRS EFF UNS RX MEDICINAL&BIOLOGICAL SBSTNC 08/18/2007  . Urinary urgency     Past Surgical History:  Procedure Laterality Date  . APPENDECTOMY  1969  . BACK SURGERY    . BLEPHAROPLASTY    . CARPAL TUNNEL RELEASE    . CHOLECYSTECTOMY    . ESOPHAGOGASTRODUODENOSCOPY (EGD) WITH PROPOFOL N/A 04/26/2015   Procedure: ESOPHAGOGASTRODUODENOSCOPY (EGD) WITH PROPOFOL;  Surgeon: Inda Castle, MD;  Location: [facility];  Service: Endoscopy;  Laterality: N/A;  . EYE SURGERY     bilateral cataracts removed and vitrectomies  . JOINT REPLACEMENT Right    knee  . KNEE ARTHROSCOPY    . LAMINECTOMY    . LATERAL FUSION LUMBAR SPINE, TRANSVERSE    . LUMBAR LAMINECTOMY/DECOMPRESSION MICRODISCECTOMY Left 11/03/2012   Procedure: LUMBAR LAMINECTOMY/DECOMPRESSION MICRODISCECTOMY 1 LEVEL;  Surgeon: Hosie Spangle, MD;  Location: [facility];  Service: Neurosurgery;  Laterality: Left;  LEFT L5S1 laminotomy foraminotomy and possible microdiskectomy  . MELANOMA EXCISION    . NASAL SINUS SURGERY    . POSTERIOR CERVICAL FUSION/FORAMINOTOMY Right 10/01/2012   Procedure: POSTERIOR CERVICAL FUSION/FORAMINOTOMY LEVEL 1;  Surgeon: Hosie Spangle, MD;  Location: [facility];  Service: Neurosurgery;  Laterality: Right;  Right Cervical seven thoracic one Cervical laminectomy/foraminotomy with posterior cervical arthrodesis   . POSTERIOR LUMBAR FUSION N/A 05/23/2013  Procedure: exploration of lumbar wound, explantation of left interbody implant.;  Surgeon: Hosie Spangle, MD;  Location: [facility];  Service: Neurosurgery;  Laterality: N/A;  . RECTAL SURGERY     Fissure  . VITRECTOMY     right and left 2013    Family History  Problem Relation Age of Onset  . Dementia Mother   . Anemia Father   . Heart disease Father   . Stroke Sister        ICH  . Lung disease Sister     Social History   Socioeconomic History  . Marital  status: Married    Spouse name: Not on file  . Number of children: 3  . Years of education: 23  . Highest education level: Not on file  Occupational History  . Occupation: Retired  Scientific laboratory technician  . Financial resource strain: Not on file  . Food insecurity:    Worry: Not on file    Inability: Not on file  . Transportation needs:    Medical: Not on file    Non-medical: Not on file  Tobacco Use  . Smoking status: Former Smoker    Packs/day: 1.00    Years: 33.00    Pack years: 33.00    Last attempt to quit: 09/29/1981    Years since quitting: 36.8  . Smokeless tobacco: Never Used  Substance and Sexual Activity  . Alcohol use: Yes    Alcohol/week: 3.0 standard drinks    Types: 3 Standard drinks or equivalent per week    Comment: Social  . Drug use: No  . Sexual activity: Yes    Partners: Female    Comment: Married  Lifestyle  . Physical activity:    Days per week: Not on file    Minutes per session: Not on file  . Stress: Not on file  Relationships  . Social connections:    Talks on phone: Not on file    Gets together: Not on file    Attends religious service: Not on file    Active member of club or organization: Not on file    Attends meetings of clubs or organizations: Not on file    Relationship status: Not on file  . Intimate partner violence:    Fear of current or ex partner: Not on file    Emotionally abused: Not on file    Physically abused: Not on file    Forced sexual activity: Not on file  Other Topics Concern  . Not on file  Social History Narrative   Lives at home w/ his wife   Left-handed   Caffeine: 1 cup coffee daily   1 son alive, 2 sons deceased   Retired Building control surveyor, Furniture conservator/restorer.    Education: GED     Physical Exam  Vital Signs and Nursing Notes reviewed Vitals:   08/10/18 1230 08/10/18 1300  BP: (!) 182/83 (!) 162/66  Pulse: 74 72  Resp: 14 15  Temp:    SpO2: 94% 92%    CONSTITUTIONAL: Well-appearing, NAD NEURO:  Alert and oriented x 3, no  focal deficits EYES:  eyes equal and reactive ENT/NECK:  no LAD, no JVD CARDIO: Regular rate, well-perfused, normal S1 and S2 PULM:  CTAB no wheezing or rhonchi GI/GU:  normal bowel sounds, non-distended, non-tender MSK/SPINE:  No gross deformities, 2+ pitting edema bilateral lower extremities SKIN:  no rash, atraumatic PSYCH:  Appropriate speech and behavior  Diagnostic and Interventional Summary    EKG Interpretation  Date/Time:  Sunday August 10 2018 08:51:34 EST Ventricular Rate:  85 PR Interval:    QRS Duration: 103 QT Interval:  357 QTC Calculation: 425 R Axis:   77 Text Interpretation:  Sinus rhythm Confirmed by Gerlene Fee [phone]) on 08/10/2018 8:56:53 AM      Labs Reviewed  CBC - Abnormal; Notable for the following components:      Result Value   Hemoglobin 12.1 (*)    All other components within normal limits  BASIC METABOLIC PANEL - Abnormal; Notable for the following components:   Glucose, Bld 125 (*)    BUN 29 (*)    Creatinine, Ser 1.33 (*)    Calcium 8.5 (*)    GFR calc non Af Amer 49 (*)    GFR calc Af Amer 56 (*)    All other components within normal limits  BRAIN NATRIURETIC PEPTIDE - Abnormal; Notable for the following components:   B Natriuretic Peptide 376.7 (*)    All other components within normal limits  D-DIMER, QUANTITATIVE (NOT AT [facility]) - Abnormal; Notable for the following components:   D-Dimer, Quant 1.17 (*)    All other components within normal limits  TROPONIN I    CT ANGIO CHEST PE W OR WO CONTRAST  Final Result    DG Chest 2 View  Final Result      Medications  aspirin chewable tablet 324 mg (324 mg Oral Given 08/10/18 0904)  labetalol (NORMODYNE,TRANDATE) injection 10 mg (10 mg Intravenous Given 08/10/18 1115)  iopamidol (ISOVUE-370) 76 % injection 100 mL (100 mLs Intravenous Contrast Given 08/10/18 1138)  hydrALAZINE (APRESOLINE) injection 10 mg (10 mg Intravenous Given 08/10/18 1215)     Procedures Critical  Care Critical Care Documentation Critical care time provided by me (excluding procedures): 36 minutes  Condition necessitating critical care: Concern for hypertensive urgency/emergency  Components of critical care management: reviewing of prior records, laboratory and imaging interpretation, frequent re-examination and reassessment of vital signs, administration of IV labetalol, IV hydralazine, discussion with consulting services    ED Course and Medical Decision Making  I have reviewed the triage vital signs and the nursing notes.  Pertinent labs & imaging results that were available during my care of the patient were reviewed by me and considered in my medical decision making (see below for details).  Chest pressure and shortness of breath in this 84 year old male with history of CAD, DVT.  Not tachycardic, symmetric leg swelling, low concern for PE.  Troponin, d-dimer, reassess.  Clinical Course as of Aug 10 1326  Sun Aug 10, 2018  0905 Anticipating admission for chest pain rule out in this 84 year old gentleman who reports that he is never had a stress test.this seems unlikely given his noted history of CAD, but there is no record of ischemic evaluation in our system.   [MB]  1057 D-dimer positive, CTPA pending   [MB]  1058 D-dimer, quantitative (not at [facility])(!) [MB]    Clinical Course User Index [MB] Maudie Flakes, MD    CTPA unremarkable.  Patient required IV labetalol and hydralazine to control his hypertension, which was up to the 277O systolic, improved to 242P systolic.  Question of hypertensive urgency but patient is asymptomatic with these blood pressures.  Troponin negative.  Admitted to hospitalist service for further care and evaluation and likely for inpatient stress testing.  Barth Kirks. Sedonia Small, MD [place] [email or web address]  Final Clinical Impressions(s) / ED Diagnoses     ICD-10-CM  1. Chest pressure R07.89  DG Chest 2 View    DG Chest 2 View  2. Hypertension, unspecified type I10     ED Discharge Orders    None         Maudie Flakes, MD 08/10/18 1328

## 2018-08-10 NOTE — Progress Notes (Signed)
Echocardiogram Complete  Nilay Mangrum, RDCS 

## 2018-08-10 NOTE — Progress Notes (Signed)
Pt transferred to 4E-27 via wheelchair from ED with staff. Pt placed in bed. Pt given Chg bath. Pt oriented to room, call bell, and bed. Tele applied, CCMD notified. Call bell within reach, will continue to monitor.  Amanda Cockayne, RN

## 2018-08-10 NOTE — H&P (Signed)
HUGHEY RITTENBERRY PNT:[id number] DOB: 30-Jan-1932 DOA: 08/10/2018     PCP: Hoyt Koch, MD   Outpatient Specialists: neurosurgeon  Patient arrived to ER on 08/10/18 at [phone]  Patient coming from: home Lives  With family wife   Chief Complaint:  Chief Complaint  Patient presents with  . Shortness of Breath    HPI: FONTAINE HEHL is a 84 y.o. male with medical history significant of back pain poorly controlled HTN, back pain, hx of "blood clot"    Presented with   Chest pressure trouble breathing. Describes as heavy sensation.  Is more like an ache and if it just feels heavy feels like he did not get enough air feels like when he tries to take a deep breath is just too heavy to take a breath.  Occasional. Comes and goes. No fatigue. He reports climbing 13 steps a day with out problems reports leg swelling. Chronic No fever no chills. Reports nasal  Congestion. Laying flat makes his congestion worse no sick contacs.  No cough No melena no black stools no urinary complaints.  Ports difficulty to manage his blood pressure. His medications has been adjusted by his primary care provider but he is still unable to be well controlled his blood pressure runs in 190s Reports his BP has been giving him trouble over past 1 year. Reports he may have had history of heart failure and attempted to use the diuretics but it did not work for him so he stopped Has had increasing sciatica in his left leg requiring prednisone shots recently.  Reports he had to have his knee injected as well as ankle injected right before Christmas reports pain going down his left leg that is coming and going that is why he takes the oxycodone he has been taking prednisone intermittently      Regarding pertinent Chronic problems:  [place] Hx of CAD never had a stress test  hx of Back surgery in 2004 on oxycodone  Hx of blood of blood lot that was stuck to the artery in his leg was not anticoagulated was told it  was ok  Hx of ECHO in 2018 EF 61-95% grade 1 diastolic dysfunction   While in ER: Given a dose of aspirin 324 Hypertensive urgency treated with hydralazine and labetalol briefly improved blood pressure down to 160s but currently back up in 200s The following Work up has been ordered so far:  Orders Placed This Encounter  Procedures  . DG Chest 2 View  . CT ANGIO CHEST PE W OR WO CONTRAST  . CBC  . Basic metabolic panel  . Brain natriuretic peptide  . D-dimer, quantitative (not at [facility])  . Troponin I - Add-On to previous collection  . Troponin I - Now Then Q6H  . Cardiac monitoring  . Consult to hospitalist  . EKG 12-Lead  . EKG 12-Lead  . Place in observation (patient's expected length of stay will be less than 2 midnights)     Following Medications were ordered in ER: Medications  aspirin chewable tablet 324 mg (324 mg Oral Given 08/10/18 0904)  labetalol (NORMODYNE,TRANDATE) injection 10 mg (10 mg Intravenous Given 08/10/18 1115)  iopamidol (ISOVUE-370) 76 % injection 100 mL (100 mLs Intravenous Contrast Given 08/10/18 1138)  hydrALAZINE (APRESOLINE) injection 10 mg (10 mg Intravenous Given 08/10/18 1215)    Significant initial  Findings: Abnormal Labs Reviewed  CBC - Abnormal; Notable for the following components:      Result Value  Hemoglobin 12.1 (*)    All other components within normal limits  BASIC METABOLIC PANEL - Abnormal; Notable for the following components:   Glucose, Bld 125 (*)    BUN 29 (*)    Creatinine, Ser 1.33 (*)    Calcium 8.5 (*)    GFR calc non Af Amer 49 (*)    GFR calc Af Amer 56 (*)    All other components within normal limits  BRAIN NATRIURETIC PEPTIDE - Abnormal; Notable for the following components:   B Natriuretic Peptide 376.7 (*)    All other components within normal limits  D-DIMER, QUANTITATIVE (NOT AT [facility]) - Abnormal; Notable for the following components:   D-Dimer, Quant 1.17 (*)    All other components within normal limits      Lactic Acid, Venous No results found for: LATICACIDVEN  Na 139 K 4.7  Cr  Up from baseline see below Lab Results  Component Value Date   CREATININE 1.33 (H) 08/10/2018   CREATININE 1.27 03/19/2018   CREATININE 0.94 04/18/2017      WBC 9.7  HG/HCT   stable,      Component Value Date/Time   HGB 12.1 (L) 08/10/2018 0905   HCT 39.9 08/10/2018 0905    Troponin (Point of Care Test) No results for input(s): TROPIPOC in the last 72 hours.    BNP (last 3 results) Recent Labs    08/10/18 0905  BNP 376.7*    ProBNP (last 3 results) No results for input(s): PROBNP in the last 8760 hours.    UA not ordered    CXR - mild prominent  vasculature CT A chest no PE mild pulmonary edema  ECG:  Personally reviewed by me showing: HR : 85 Rhythm:  NSR,  no evidence of ischemic changes QTC 425     ED Triage Vitals  Enc Vitals Group     BP 08/10/18 0854 (!) 173/69     Pulse Rate 08/10/18 0854 82     Resp 08/10/18 0854 16     Temp 08/10/18 0854 98.6 F (37 C)     Temp Source 08/10/18 0854 Oral     SpO2 08/10/18 0854 96 %     Weight 08/10/18 0854 210 lb (95.3 kg)     Height 08/10/18 0854 6' (1.829 m)     Head Circumference --      Peak Flow --      Pain Score 08/10/18 0858 7     Pain Loc --      Pain Edu? --      Excl. in [place]? --   TMAX(24)@       Latest  Blood pressure (!) 202/95, pulse 72, temperature 98.6 F (37 C), temperature source Oral, resp. rate 15, height 6' (1.829 m), weight 95.3 kg, SpO2 92 %.      Hospitalist was called for admission for Hypertensive urgency    Review of Systems:    Pertinent positives include: fatigue, chest pressure  shortness of breath at rest Bilateral lower extremity swelling   Constitutional:  No weight loss, night sweats, Fevers, chills,  weight loss  HEENT:  No headaches, Difficulty swallowing,Tooth/dental problems,Sore throat,  No sneezing, itching, ear ache, nasal congestion, post nasal drip,    Cardio-vascular:  No chest pain, Orthopnea, PND, anasarca, dizziness, palpitations.no GI:  No heartburn, indigestion, abdominal pain, nausea, vomiting, diarrhea, change in bowel habits, loss of appetite, melena, blood in stool, hematemesis Resp:  no. No dyspnea on exertion, No excess mucus,  no productive cough, No non-productive cough, No coughing up of blood.No change in color of mucus.No wheezing. Skin:  no rash or lesions. No jaundice GU:  no dysuria, change in color of urine, no urgency or frequency. No straining to urinate.  No flank pain.  Musculoskeletal:  No joint pain or no joint swelling. No decreased range of motion. No back pain.  Psych:  No change in mood or affect. No depression or anxiety. No memory loss.  Neuro: no localizing neurological complaints, no tingling, no weakness, no double vision, no gait abnormality, no slurred speech, no confusion  All systems reviewed and apart from [place] all are negative  Past Medical History:   Past Medical History:  Diagnosis Date  . ACTINIC KERATOSIS, HEAD 03/23/2008  . ALLERGIC RHINITIS 03/27/2007  . Arthritis   . BENIGN PROSTATIC HYPERTROPHY, WITH URINARY OBSTRUCTION 10/16/2007  . CAD 08/18/2007   "patient denies any issues with heart, does not see cardiologist"  . Carpal tunnel syndrome, bilateral 09/04/2016  . CARPAL TUNNEL SYNDROME, LEFT 04/13/2008  . Chronic back pain   . Chronic neck pain   . DVT (deep venous thrombosis) ([place])   . ERECTILE DYSFUNCTION, ORGANIC 02/20/2010  . Gout, unspecified 02/20/2010  . Headache(784.0)    migraines hx of  . History of melanoma   . HYPERLIPIDEMIA, WITH HIGH HDL 10/18/2008  . HYPERTENSION 05/09/2007   sees Dr. Benay Pillow  . KNEE PAIN 10/10/2009  . LOW BACK PAIN 03/27/2007  . NEOPLASM, MALIGNANT, SKIN, TRUNK 05/09/2007  . Patellar tendinitis 10/10/2009  . SKIN CANCER, HX OF 03/27/2007  . UNS ADVRS EFF UNS RX MEDICINAL&BIOLOGICAL SBSTNC 08/18/2007  . Urinary urgency       Past Surgical  History:  Procedure Laterality Date  . APPENDECTOMY  1969  . BACK SURGERY    . BLEPHAROPLASTY    . CARPAL TUNNEL RELEASE    . CHOLECYSTECTOMY    . ESOPHAGOGASTRODUODENOSCOPY (EGD) WITH PROPOFOL N/A 04/26/2015   Procedure: ESOPHAGOGASTRODUODENOSCOPY (EGD) WITH PROPOFOL;  Surgeon: Inda Castle, MD;  Location: [facility];  Service: Endoscopy;  Laterality: N/A;  . EYE SURGERY     bilateral cataracts removed and vitrectomies  . JOINT REPLACEMENT Right    knee  . KNEE ARTHROSCOPY    . LAMINECTOMY    . LATERAL FUSION LUMBAR SPINE, TRANSVERSE    . LUMBAR LAMINECTOMY/DECOMPRESSION MICRODISCECTOMY Left 11/03/2012   Procedure: LUMBAR LAMINECTOMY/DECOMPRESSION MICRODISCECTOMY 1 LEVEL;  Surgeon: Hosie Spangle, MD;  Location: [place] [facility];  Service: Neurosurgery;  Laterality: Left;  LEFT L5S1 laminotomy foraminotomy and possible microdiskectomy  . MELANOMA EXCISION    . NASAL SINUS SURGERY    . POSTERIOR CERVICAL FUSION/FORAMINOTOMY Right 10/01/2012   Procedure: POSTERIOR CERVICAL FUSION/FORAMINOTOMY LEVEL 1;  Surgeon: Hosie Spangle, MD;  Location: [place] [facility]UR[facility] OR[facility];  Service: Neurosurgery;  Laterality: Right;  Right Cervical seven thoracic one Cervical laminectomy/foraminotomy with posterior cervical arthrodesis   . POSTERIOR LUMBAR FUSION N/A 05/23/2013   Procedure: exploration of lumbar wound, explantation of left interbody implant.;  Surgeon: Hosie Spangle, MD;  Location: [place];  Service: Neurosurgery;  Laterality: N/A;  . RECTAL SURGERY     Fissure  . VITRECTOMY     right and left 2013    Social History:  Ambulatory   independently       reports that he quit smoking about 36 years ago. He has a 33.00 pack-year smoking history. He has never used smokeless tobacco. He reports current alcohol use of about 3.0 standard  drinks of alcohol per week. He reports that he does not use drugs.     Family History:   Family History  Problem Relation Age of Onset  . Dementia Mother   .  Anemia Father   . Heart disease Father   . Stroke Sister        ICH  . Lung disease Sister     Allergies: Allergies  Allergen Reactions  . Amlodipine     Ankle swelling  . Gabapentin     Lower extremity edema  . Lyrica [Pregabalin] Swelling     Prior to Admission medications   Medication Sig Start Date End Date Taking? Authorizing Provider  carvedilol (COREG) 25 MG tablet TAKE 1 TABLET BY MOUTH 2 TIMES DAILY WITH A MEAL Patient taking differently: Take 25 mg by mouth 2 (two) times daily with a meal.  07/21/18  Yes Hoyt Koch, MD  [employer] VITAMIN B12 1000 MCG tablet TAKE 1 TABLET (1,000 MCG TOTAL) BY MOUTH DAILY. Patient taking differently: Take 1,000 mcg by mouth daily as needed (energy).  06/19/18  Yes Hoyt Koch, MD  finasteride (PROSCAR) 5 MG tablet Take 1 tablet (5 mg total) by mouth daily. Patient taking differently: Take 5 mg by mouth every evening.  11/11/17  Yes Hoyt Koch, MD  fluticasone [facility]) 50 MCG/ACT nasal spray Place 1 spray into both nostrils every evening.    Yes [provider]  hydrALAZINE (APRESOLINE) 50 MG tablet Take 1 tablet (50 mg total) by mouth 2 (two) times daily. 07/31/18  Yes Hoyt Koch, MD  losartan (COZAAR) 100 MG tablet TAKE 1 TABLET BY MOUTH EVERY DAY Patient taking differently: Take 100 mg by mouth daily.  04/01/18  Yes Hoyt Koch, MD  magnesium hydroxide (MILK OF MAGNESIA) 800 MG/5ML suspension Take 15 mLs by mouth daily as needed (leg cramps).    Yes [provider]  Oxycodone HCl 10 MG TABS Take 10 mg by mouth 3 (three) times daily as needed (pain).  07/14/14  Yes [provider]  predniSONE (DELTASONE) 20 MG tablet Take 2 tablets (40 mg total) by mouth daily with breakfast. Patient taking differently: Take 20-40 mg by mouth daily as needed (inflamation).  07/31/18  Yes Hoyt Koch, MD  tamsulosin (FLOMAX) 0.4 MG CAPS capsule TAKE 1 CAPSULE (0.4 MG TOTAL) BY  MOUTH DAILY. Patient taking differently: Take 0.4 mg by mouth every morning.  01/24/18  Yes Hoyt Koch, MD  triamcinolone cream (KENALOG) 0.5 % USE AS DIRECTED Patient taking differently: Apply 1 application topically daily as needed (rash).  10/14/17  Yes Hoyt Koch, MD   Physical Exam: Blood pressure (!) 162/66, pulse 72, temperature 98.6 F (37 C), temperature source Oral, resp. rate 15, height 6' (1.829 m), weight 95.3 kg, SpO2 92 %. 1. General:  in No Acute distress  well  -appearing 2. Psychological: Alert and  Oriented 3. Head/ENT:   Moist   Mucous Membranes                          Head Non traumatic, neck supple                         Poor Dentition 4. SKIN: normal   Skin turgor,  Skin clean Dry and intact no rash 5. Heart: Regular rate and rhythm no  Murmur, no Rub or gallop 6. Lungs:  no wheezes some crackles  7. Abdomen: Soft,  non-tender, distended  bowel sounds present 8. Lower extremities: no clubbing, cyanosis, or 2+ edema 9. Neurologically Grossly intact, moving all 4 extremities equally  10. MSK: Normal range of motion   LABS:     Recent Labs  Lab 08/10/18 0905  WBC 9.7  HGB 12.1*  HCT 39.9  MCV 92.8  PLT 353   Basic Metabolic Panel: Recent Labs  Lab 08/10/18 0905  NA 139  K 4.7  CL 105  CO2 27  GLUCOSE 125*  BUN 29*  CREATININE 1.33*  CALCIUM 8.5*      No results for input(s): AST, ALT, ALKPHOS, BILITOT, PROT, ALBUMIN in the last 168 hours. No results for input(s): LIPASE, AMYLASE in the last 168 hours. No results for input(s): AMMONIA in the last 168 hours.    HbA1C: No results for input(s): HGBA1C in the last 72 hours. CBG: No results for input(s): GLUCAP in the last 168 hours.    Urine analysis:    Component Value Date/Time   COLORURINE YELLOW 10/24/2015 [place] 10/24/2015 1753   LABSPEC 1.008 10/24/2015 1753   PHURINE 7.5 10/24/2015 1753   GLUCOSEU NEGATIVE 10/24/2015 1753   HGBUR  NEGATIVE 10/24/2015 1753   BILIRUBINUR NEGATIVE 10/24/2015 1753   BILIRUBINUR n 08/12/2012 1207   KETONESUR NEGATIVE 10/24/2015 1753   PROTEINUR NEGATIVE 10/24/2015 1753   UROBILINOGEN 0.2 08/12/2012 1207   UROBILINOGEN 0.2 11/16/2009 0800   NITRITE NEGATIVE 10/24/2015 1753   LEUKOCYTESUR NEGATIVE 10/24/2015 1753       Cultures:    Component Value Date/Time   SDES BLOOD ARM LEFT 06/01/2013 1940   SPECREQUEST BOTTLES DRAWN AEROBIC AND ANAEROBIC 10CC 06/01/2013 1940   CULT  06/01/2013 1940    NO GROWTH 5 DAYS Performed at [facility] 06/08/2013 FINAL 06/01/2013 1940     Radiological Exams on Admission: Dg Chest 2 View  Result Date: 08/10/2018 CLINICAL DATA:  Chest pressure. EXAM: CHEST - 2 VIEW COMPARISON:  05/05/2013 FINDINGS: Vascular markings are mildly prominent but no overt pulmonary edema. Heart size is within normal limits and stable. Surgical changes in lower cervical spine. There appears to be pleural thickening along the posterior lower aspect of the chest on the lateral view. No large pleural effusion. Bridging osteophytes in thoracic spine. IMPRESSION: Vascular markings are mildly prominent but no overt pulmonary edema. Probable pleural thickening along the posterior lung bases. Electronically Signed   By: Markus Daft M.D.   On: 08/10/2018 10:13   Ct Angio Chest Pe W Or Wo Contrast  Result Date: 08/10/2018 CLINICAL DATA:  Pt c/o SOB and hypertension off and on for a couple weeks Isovue 370 166ml^100mL ISOVUE-370 IOPAMIDOL (ISOVUE-370) INJECTION 76% EXAM: CT ANGIOGRAPHY CHEST WITH CONTRAST TECHNIQUE: Multidetector CT imaging of the chest was performed using the standard protocol during bolus administration of intravenous contrast. Multiplanar CT image reconstructions and MIPs were obtained to evaluate the vascular anatomy. CONTRAST:  171mL ISOVUE-370 IOPAMIDOL (ISOVUE-370) INJECTION 76% COMPARISON:  Chest x-ray on 08/10/2018 FINDINGS: Cardiovascular:  Satisfactory opacification of the pulmonary arteries to the segmental level. No evidence of pulmonary embolism. Normal heart size. No pericardial effusion. There is coronary artery calcification. Heart size is normal. There is atherosclerotic calcification of the thoracic aorta not associated with aneurysm. Mildly tortuous descending aorta. Mediastinum/Nodes: No enlarged mediastinal, hilar, or axillary lymph nodes. Thyroid gland, trachea, and esophagus demonstrate no significant findings. Lungs/Pleura: There are bilateral pleural effusions. Thickened interseptal lines are identified primarily with  a basilar predominance. There are dependent ground-glass opacities. Findings are consistent with mild pulmonary edema. No suspicious pulmonary nodules. Subsegmental atelectasis at the lung bases. No frank consolidations. Upper Abdomen: There is diffuse low-attenuation of the liver, consistent with hepatic steatosis. There is dense atherosclerotic calcification of the splenic artery. No acute abnormality in the UPPER abdomen. Musculoskeletal: Moderate degenerative changes in the thoracic spine. No suspicious lytic or blastic lesions are identified. Review of the MIP images confirms the above findings. IMPRESSION: 1. Technically adequate exam showing no acute pulmonary embolus. 2. Mild pulmonary edema. 3. Coronary artery disease. 4. Hepatic steatosis. Aortic atherosclerosis.  (ICD10-I70.0) Electronically Signed   By: Nolon Nations M.D.   On: 08/10/2018 12:23    Chart has been reviewed    Assessment/Plan  84 y.o. male with medical history significant of back pain poorly controlled HTN, back pain, hx of "blood clot"  Admitted for acute on chronic diastolic CHF exacerbation hypertensive  urgency  Present on Admission: . Hypertensive urgency -likely secondary to fluid overload we will cycle cardiac enzymes since remained significantly hypertensive admit to stepdown.  Diuresis if needed may need to start  nitroglycerin drip   Cycle cardiac enzymes then echogram . Hyperlipidemia -check lipid panel in the morning continue home medications . Essential hypertension -poorly controlled with current hypertensive urgency in the setting of fluid overload and chest pressure. . Benign prostatic hyperplasia we will continue home medications  . Acute diastolic CHF (congestive heart failure) ([place]) -  - admit on telemetry,  cycle cardiac enzymes,    obtain serial ECG  to evaluate for ischemia as a cause of heart failure  monitor daily weight:  Filed Weights   08/10/18 0854  Weight: 95.3 kg   Last BNP BNP (last 3 results) Recent Labs    08/10/18 0905  BNP 376.7*     diurese with IV lasix and monitor orthostatics and creatinine to avoid over diuresis.  Order echogram to evaluate EF and valves  ACE/ARBi held given elevated cr   cardiology emailed to see in AM  AKI -versus chronic kidney disease.  Will obtain urine electrolytes check renal ultrasound as well as renal arterial duplex to evaluate for renal artery stenosis in the setting of accelerated hypertension difficult to manage  Chest pressure will monitor in stepdown since continues to have chest discomfort although no pain this is likely in the setting of fluid overload.  Cycle cardiac enzymes.  CTA negative for PE.  Other plan as per orders.  DVT prophylaxis:   Lovenox     Code Status:  DNR/DNI as per patient   I had personally discussed CODE STATUS with patient    Family Communication:   Family not at  Bedside     Disposition Plan:     To home once workup is complete and patient is stable                    Would benefit from PT/OT eval prior to DC  Ordered                                       Consults called:  email cardiology  Admission status:  Obs    Level of care     SDU tele indefinitely please discontinue once patient no longer qualifies        Christiona Siddique 08/10/2018, 2:32 PM    [facility]  Pager  [phone]   after 2 AM please page floor coverage PA If 7AM-7PM, please contact the day team taking care of the patient  [email or web address]  Password [email or web address]

## 2018-08-10 NOTE — ED Notes (Signed)
Doctor notified of patients blood pressure.

## 2018-08-10 NOTE — ED Triage Notes (Signed)
Onset one week ago shortness of breath and when laying down his nasal passages would restrict per patient. Denies nasal or chest congestion. States pressure heavy 7/10 intermittent.

## 2018-08-11 ENCOUNTER — Encounter (HOSPITAL_COMMUNITY): Payer: Self-pay | Admitting: Cardiology

## 2018-08-11 ENCOUNTER — Observation Stay (HOSPITAL_BASED_OUTPATIENT_CLINIC_OR_DEPARTMENT_OTHER): Payer: Medicare Other

## 2018-08-11 ENCOUNTER — Other Ambulatory Visit: Payer: Self-pay

## 2018-08-11 DIAGNOSIS — I110 Hypertensive heart disease with heart failure: Secondary | ICD-10-CM | POA: Diagnosis not present

## 2018-08-11 DIAGNOSIS — Z85820 Personal history of malignant melanoma of skin: Secondary | ICD-10-CM | POA: Diagnosis not present

## 2018-08-11 DIAGNOSIS — Z87891 Personal history of nicotine dependence: Secondary | ICD-10-CM | POA: Diagnosis not present

## 2018-08-11 DIAGNOSIS — I160 Hypertensive urgency: Secondary | ICD-10-CM | POA: Diagnosis not present

## 2018-08-11 DIAGNOSIS — N400 Enlarged prostate without lower urinary tract symptoms: Secondary | ICD-10-CM | POA: Diagnosis not present

## 2018-08-11 DIAGNOSIS — I5031 Acute diastolic (congestive) heart failure: Secondary | ICD-10-CM | POA: Diagnosis not present

## 2018-08-11 DIAGNOSIS — Z8249 Family history of ischemic heart disease and other diseases of the circulatory system: Secondary | ICD-10-CM | POA: Diagnosis not present

## 2018-08-11 DIAGNOSIS — Z981 Arthrodesis status: Secondary | ICD-10-CM | POA: Diagnosis not present

## 2018-08-11 DIAGNOSIS — R7989 Other specified abnormal findings of blood chemistry: Secondary | ICD-10-CM | POA: Diagnosis not present

## 2018-08-11 DIAGNOSIS — G5603 Carpal tunnel syndrome, bilateral upper limbs: Secondary | ICD-10-CM | POA: Diagnosis not present

## 2018-08-11 DIAGNOSIS — R0789 Other chest pain: Secondary | ICD-10-CM

## 2018-08-11 DIAGNOSIS — Z86718 Personal history of other venous thrombosis and embolism: Secondary | ICD-10-CM | POA: Diagnosis not present

## 2018-08-11 DIAGNOSIS — E785 Hyperlipidemia, unspecified: Secondary | ICD-10-CM | POA: Diagnosis not present

## 2018-08-11 DIAGNOSIS — I10 Essential (primary) hypertension: Secondary | ICD-10-CM | POA: Diagnosis not present

## 2018-08-11 DIAGNOSIS — I5033 Acute on chronic diastolic (congestive) heart failure: Secondary | ICD-10-CM | POA: Diagnosis not present

## 2018-08-11 DIAGNOSIS — Z66 Do not resuscitate: Secondary | ICD-10-CM | POA: Diagnosis not present

## 2018-08-11 LAB — CBC
HCT: 38 % — ABNORMAL LOW (ref 39.0–52.0)
Hemoglobin: 11.7 g/dL — ABNORMAL LOW (ref 13.0–17.0)
MCH: 27.5 pg (ref 26.0–34.0)
MCHC: 30.8 g/dL (ref 30.0–36.0)
MCV: 89.4 fL (ref 80.0–100.0)
Platelets: 338 10*3/uL (ref 150–400)
RBC: 4.25 MIL/uL (ref 4.22–5.81)
RDW: 13.8 % (ref 11.5–15.5)
WBC: 8.8 10*3/uL (ref 4.0–10.5)
nRBC: 0 % (ref 0.0–0.2)

## 2018-08-11 LAB — COMPREHENSIVE METABOLIC PANEL
ALT: 17 U/L (ref 0–44)
AST: 16 U/L (ref 15–41)
Albumin: 3.1 g/dL — ABNORMAL LOW (ref 3.5–5.0)
Alkaline Phosphatase: 57 U/L (ref 38–126)
Anion gap: 8 (ref 5–15)
BUN: 23 mg/dL (ref 8–23)
CO2: 25 mmol/L (ref 22–32)
Calcium: 8.2 mg/dL — ABNORMAL LOW (ref 8.9–10.3)
Chloride: 105 mmol/L (ref 98–111)
Creatinine, Ser: 1.25 mg/dL — ABNORMAL HIGH (ref 0.61–1.24)
GFR calc Af Amer: >60 mL/min (ref >60)
GFR calc non Af Amer: 53 mL/min — ABNORMAL LOW (ref >60)
Glucose, Bld: 99 mg/dL (ref 70–99)
Potassium: 3.8 mmol/L (ref 3.5–5.1)
Sodium: 138 mmol/L (ref 135–145)
Total Bilirubin: 0.6 mg/dL (ref 0.3–1.2)
Total Protein: 5.4 g/dL — ABNORMAL LOW (ref 6.5–8.1)

## 2018-08-11 LAB — TROPONIN I: Troponin I: <0.03 ng/mL (ref <0.03)

## 2018-08-11 LAB — LIPID PANEL
Cholesterol: 160 mg/dL (ref 0–200)
HDL: 51 mg/dL (ref >40)
LDL Cholesterol: 89 mg/dL (ref 0–99)
Total CHOL/HDL Ratio: 3.1 RATIO
Triglycerides: 100 mg/dL (ref <150)
VLDL: 20 mg/dL (ref 0–40)

## 2018-08-11 LAB — MAGNESIUM: Magnesium: 2.2 mg/dL (ref 1.7–2.4)

## 2018-08-11 LAB — PHOSPHORUS: Phosphorus: 3.6 mg/dL (ref 2.5–4.6)

## 2018-08-11 LAB — TSH: TSH: 1.779 u[IU]/mL (ref 0.350–4.500)

## 2018-08-11 MED ORDER — AMLODIPINE BESYLATE 5 MG PO TABS: 5.0000 mg | ORAL_TABLET | Freq: Every day | ORAL | Status: DC

## 2018-08-11 MED ORDER — SPIRONOLACTONE 25 MG PO TABS
25.0000 mg | ORAL_TABLET | Freq: Every day | ORAL | Status: DC
  Administered 2018-08-11 – 2018-08-12 (×2): 25 mg via ORAL
  Filled 2018-08-11 (×2): qty 1

## 2018-08-11 MED ORDER — AMLODIPINE BESYLATE 10 MG PO TABS
10.0000 mg | ORAL_TABLET | Freq: Every day | ORAL | Status: DC
  Administered 2018-08-12: 10 mg via ORAL
  Filled 2018-08-11: qty 1

## 2018-08-11 MED ORDER — ACETAMINOPHEN 325 MG PO TABS: 650.0000 mg | ORAL_TABLET | Freq: Four times a day (QID) | ORAL | Status: DC | PRN

## 2018-08-11 MED ORDER — AMLODIPINE BESYLATE 5 MG PO TABS
5.0000 mg | ORAL_TABLET | Freq: Once | ORAL | Status: AC
  Administered 2018-08-11: 5 mg via ORAL
  Filled 2018-08-11: qty 1

## 2018-08-11 NOTE — Progress Notes (Signed)
OT Cancellation Note  Patient Details Name: [name] MRN: [id number] DOB: May 19, 1932   Cancelled Treatment:    Reason Eval/Treat Not Completed: OT screened, no needs identified, will sign off.   Spoke with PT, no OT needed.   Lucille Passy, OTR/L [place] Pager [phone] Office [phone]   Lucille Passy M 08/11/2018, 12:59 PM

## 2018-08-11 NOTE — Evaluation (Signed)
Physical Therapy Evaluation/ Discharge Patient Details Name: [name] MRN: [id number] DOB: 12/22/1931 Today's Date: 08/11/2018   History of Present Illness  84 yo admitted with SOB with HTN urgency. PMhx: back pain s/p lumbar fusion, HTN, Rt TKA  Clinical Impression  Pt very pleasant on arrival with pants on. Pt reports he received injections to ankles and left knee for arthritis before Christmas and that he needs Lt TKA but with wife unable to drive or perform homemaking he is holding off at this time. Pt able to perform all transfers, don shoes EOB, walk, perform stairs all without difficulty or SoB. Pt with increased gait speed with increased distance. Pt encouraged to continue hall ambulation acutely but no further therapy needs at this time. Will sign off with pt aware and agreeable.   HR 82-90 SPO2 93% on RA with gait BP pre 101/52, post 147/62    Follow Up Recommendations No PT follow up    Equipment Recommendations  None recommended by PT    Recommendations for Other Services       Precautions / Restrictions Precautions Precautions: Other (comment) Precaution Comments: nitro drip      Mobility  Bed Mobility Overal bed mobility: Independent                Transfers Overall transfer level: Independent                  Ambulation/Gait Ambulation/Gait assistance: Independent Gait Distance (Feet): 600 Feet Assistive device: None Gait Pattern/deviations: WFL(Within Functional Limits);Antalgic   Gait velocity interpretation: >4.37 ft/sec, indicative of normal walking speed General Gait Details: pt initial gait antalgic with decreased stride due to ankle and knee pain. Grossly 100' into gait pt with increased stride, fluidity of movement and speed with pt reporting gait and "warm up" are baseline  Stairs Stairs: Yes Stairs assistance: Modified independent (Device/Increase time) Stair Management: Step to pattern;Alternating pattern;Forwards Number  of Stairs: 10 General stair comments: Pt ascended alternating and step to with descent with use of rails  Wheelchair Mobility    Modified Rankin (Stroke Patients Only)       Balance Overall balance assessment: Mild deficits observed, not formally tested                                           Pertinent Vitals/Pain Pain Assessment: 0-10 Pain Score: 2  Pain Location: left knee Pain Descriptors / Indicators: Aching Pain Intervention(s): Limited activity within patient's tolerance;Monitored during session    Home Living Family/patient expects to be discharged to:: Private residence Living Arrangements: Spouse/significant other   Type of Home: House Home Access: Stairs to enter Entrance Stairs-Rails: Psychiatric nurse of Steps: [place]: One level Home Equipment: Environmental consultant - 2 wheels;Shower seat;Wheelchair - manual;Hand held shower head Additional Comments: equipment belongs to wife    Prior Function Level of Independence: Independent         Comments: pt does all the homemaking and assists wife with bathing     Hand Dominance        Extremity/Trunk Assessment   Upper Extremity Assessment Upper Extremity Assessment: Overall WFL for tasks assessed    Lower Extremity Assessment Lower Extremity Assessment: Overall WFL for tasks assessed(decreased left knee and right ankle ROM due to arthritis)    Cervical / Trunk Assessment Cervical / Trunk Assessment: Normal  Communication   Communication: No  difficulties  Cognition Arousal/Alertness: Awake/alert Behavior During Therapy: WFL for tasks assessed/performed Overall Cognitive Status: Within Functional Limits for tasks assessed                                        General Comments      Exercises     Assessment/Plan    PT Assessment Patent does not need any further PT services  PT Problem List         PT Treatment Interventions      PT Goals  (Current goals can be found in the Care Plan section)  Acute Rehab PT Goals PT Goal Formulation: All assessment and education complete, DC therapy    Frequency     Barriers to discharge        Co-evaluation               AM-PAC PT "6 Clicks" Mobility  Outcome Measure Help needed turning from your back to your side while in a flat bed without using bedrails?: None Help needed moving from lying on your back to sitting on the side of a flat bed without using bedrails?: None Help needed moving to and from a bed to a chair (including a wheelchair)?: None Help needed standing up from a chair using your arms (e.g., wheelchair or bedside chair)?: None Help needed to walk in hospital room?: None Help needed climbing 3-5 steps with a railing? : None 6 Click Score: 24    End of Session   Activity Tolerance: Patient tolerated treatment well Patient left: in bed;with call bell/phone within reach(per request, denied chair) Nurse Communication: Mobility status PT Visit Diagnosis: Difficulty in walking, not elsewhere classified (R26.2)    Time: 7282-0601 PT Time Calculation (min) (ACUTE ONLY): 22 min   Charges:   PT Evaluation $PT Eval Moderate Complexity: [place] Pager: [phone] Office: [phone]   Pranit Owensby B [place] 08/11/2018, 12:55 PM

## 2018-08-11 NOTE — Progress Notes (Signed)
[place] TEAM [place]  EVO:[id number] DOB: 01-01-1932 DOA: 08/10/2018 PCP: Hoyt Koch, MD    Brief Narrative:  84 y.o. male w/ a hx of back pain, uncontrolled HTN, and a "blood clot" who presented w/ intermittent chest heaviness and trouble breathing.   Significant Events: 12/29 admit   Subjective: Resting comfortably in bed. Reports SOB/chest heaviness essentially resolved. Remains on nitro gtt. Denies ha, cp, n/v, or abdom pain. Is anxious to get back home to his wife for whom he provides ADL care.   Assessment & Plan:  Uncontrolled HTN No RAS on renal artery duplex - TTE w/o acute findings - adjust medical tx and attempt to wean off nitro gtt   Acute diastolic CHF TTE noted EF 55-60% w/ grade 1 DD and mild LVH (stable since 2018) - acute sx due to uncontrolled HTN  Acute kidney injury   Likely hypertensive in nature - follow trend   HLD  BPH  DVT prophylaxis: lovenox  Code Status: DNR - NO CODE Family Communication: no family present at time of exam  Disposition Plan: wean nitro off - titrate other BP meds - possible d/c 12/31  Consultants:  [facility]   Antimicrobials:  none   Objective: Blood pressure (!) 147/62, pulse 82, temperature 98.3 F (36.8 C), temperature source Oral, resp. rate 17, height 6' (1.829 m), weight 92.9 kg, SpO2 93 %.  Intake/Output Summary (Last 24 hours) at 08/11/2018 1632 Last data filed at 08/11/2018 1111 Gross per 24 hour  Intake 566.72 ml  Output 1750 ml  Net -1183.28 ml   Filed Weights   08/10/18 0854 08/11/18 0500  Weight: 95.3 kg 92.9 kg    Examination: General: No acute respiratory distress Lungs: fine bibasilar crackles - no wheezing  Cardiovascular: Regular rate and rhythm without murmur gallop or rub normal S1 and S2 Abdomen: Nontender, nondistended, soft, bowel sounds positive, no rebound, no ascites, no appreciable mass Extremities: No significant cyanosis, clubbing,  or edema bilateral lower extremities  CBC: Recent Labs  Lab 08/10/18 0905 08/11/18 0210  WBC 9.7 8.8  HGB 12.1* 11.7*  HCT 39.9 38.0*  MCV 92.8 89.4  PLT 326 299   Basic Metabolic Panel: Recent Labs  Lab 08/10/18 0905 08/11/18 0210  NA 139 138  K 4.7 3.8  CL 105 105  CO2 27 25  GLUCOSE 125* 99  BUN 29* 23  CREATININE 1.33* 1.25*  CALCIUM 8.5* 8.2*  MG  --  2.2  PHOS  --  3.6   GFR: Estimated Creatinine Clearance: 48.3 mL/min (A) (by C-G formula based on SCr of 1.25 mg/dL (H)).  Liver Function Tests: Recent Labs  Lab 08/11/18 0210  AST 16  ALT 17  ALKPHOS 57  BILITOT 0.6  PROT 5.4*  ALBUMIN 3.1*    Cardiac Enzymes: Recent Labs  Lab 08/10/18 0905 08/10/18 1420 08/10/18 2005 08/11/18 0210  TROPONINI <0.03 <0.03 <0.03 <0.03    HbA1C: Hgb A1c MFr Bld  Date/Time Value Ref Range Status  03/19/2018 09:32 AM 6.2 4.6 - 6.5 % Final    Comment:    Glycemic Control Guidelines for People with Diabetes:Non Diabetic:  <6%Goal of Therapy: <7%Additional Action Suggested:  >8%     Scheduled Meds: . amLODipine  5 mg Oral Daily  . aspirin EC  81 mg Oral Daily  . carvedilol  25 mg Oral BID WC  . enoxaparin (LOVENOX) injection  40 mg Subcutaneous Q24H  . finasteride  5 mg  Oral QPM  . fluticasone  1 spray Each Nare QPM  . hydrALAZINE  50 mg Oral BID  . sodium chloride flush  3 mL Intravenous Q12H  . spironolactone  25 mg Oral Daily  . tamsulosin  0.4 mg Oral [id number]     LOS: 0 days   Cherene Altes, MD [facility] Office  [phone] Pager - Text Page per Amion  If 7PM-7AM, please contact night-coverage per Amion 08/11/2018, 4:32 PM

## 2018-08-11 NOTE — Consult Note (Addendum)
Cardiology Consultation:   Patient ID: [name] MRN: [id number]; DOB: 06/05/1932  Admit date: 08/10/2018 Date of Consult: 08/11/2018  Primary Care Provider: Hoyt Koch, MD Primary Cardiologist: No primary care provider on file. new Dr. Vita Barley  Primary Electrophysiologist:  None    Patient Profile:   [name] is a 84 y.o. male with a hx of HTN, back pain, HLD  who is being seen today for the evaluation of CHF at the request of Dr. Thereasa Solo.  History of Present Illness:   [name] with above hx and has not seen cardiology presented to ER 08/10/18 with SOB and chest pressure.  Was having trouble lying flat.   He noted labile BP.  Had been on diuretics in past but stopped taking them.  With his back pain and sciatica he receives steroid injections also with oxycodone.  BP on arrival 194/92 to 224/98.   He had echo in 2018 EF 50-55% G1 DD.   Echo this admit with Ef 55-60%, G1DD mild LVH    No change in echo from 2018.    EKG:  The EKG was personally reviewed and demonstrates:  SR no acute changes  Telemetry:  Telemetry was personally reviewed and demonstrates:  SR  Troponin <0.03  X 4  Na 138; K+ 3.8, Cr 1.25 down from 1.33 mg+ 2.2  Hgb 11.7, WBC 8.8  plts 338 T chol 160, HDL 51, LDL 89 TG 100 DDimer 1.779  2V CXR vascular markings are mildly prominent but no overt pulmonary edema,  prob pl thickening along the post lung bases.   CTA of chest  Neg PE, + coronary calcification, bil pl effusions   Currently feeling better,  At home he is active, no longer uses push mower but does riding mower and uses weed eater without chest pain.  No chest pain at all only the pressure yesterday.     Past Medical History:  Diagnosis Date  . ACTINIC KERATOSIS, HEAD 03/23/2008  . ALLERGIC RHINITIS 03/27/2007  . Arthritis   . BENIGN PROSTATIC HYPERTROPHY, WITH URINARY OBSTRUCTION 10/16/2007  . CAD 08/18/2007   "patient denies any issues with heart, does not see  cardiologist"  . Carpal tunnel syndrome, bilateral 09/04/2016  . CARPAL TUNNEL SYNDROME, LEFT 04/13/2008  . Chronic back pain   . Chronic neck pain   . DVT (deep venous thrombosis) ([place])   . ERECTILE DYSFUNCTION, ORGANIC 02/20/2010  . Gout, unspecified 02/20/2010  . Headache(784.0)    migraines hx of  . History of melanoma   . HYPERLIPIDEMIA, WITH HIGH HDL 10/18/2008  . HYPERTENSION 05/09/2007   sees Dr. Benay Pillow  . KNEE PAIN 10/10/2009  . LOW BACK PAIN 03/27/2007  . NEOPLASM, MALIGNANT, SKIN, TRUNK 05/09/2007  . Patellar tendinitis 10/10/2009  . SKIN CANCER, HX OF 03/27/2007  . UNS ADVRS EFF UNS RX MEDICINAL&BIOLOGICAL SBSTNC 08/18/2007  . Urinary urgency     Past Surgical History:  Procedure Laterality Date  . APPENDECTOMY  1969  . BACK SURGERY    . BLEPHAROPLASTY    . CARPAL TUNNEL RELEASE    . CHOLECYSTECTOMY    . ESOPHAGOGASTRODUODENOSCOPY (EGD) WITH PROPOFOL N/A 04/26/2015   Procedure: ESOPHAGOGASTRODUODENOSCOPY (EGD) WITH PROPOFOL;  Surgeon: Inda Castle, MD;  Location: [facility];  Service: Endoscopy;  Laterality: N/A;  . EYE SURGERY     bilateral cataracts removed and vitrectomies  . JOINT REPLACEMENT Right    knee  . KNEE ARTHROSCOPY    . LAMINECTOMY    .  LATERAL FUSION LUMBAR SPINE, TRANSVERSE    . LUMBAR LAMINECTOMY/DECOMPRESSION MICRODISCECTOMY Left 11/03/2012   Procedure: LUMBAR LAMINECTOMY/DECOMPRESSION MICRODISCECTOMY 1 LEVEL;  Surgeon: Hosie Spangle, MD;  Location: [facility];  Service: Neurosurgery;  Laterality: Left;  LEFT L5S1 laminotomy foraminotomy and possible microdiskectomy  . MELANOMA EXCISION    . NASAL SINUS SURGERY    . POSTERIOR CERVICAL FUSION/FORAMINOTOMY Right 10/01/2012   Procedure: POSTERIOR CERVICAL FUSION/FORAMINOTOMY LEVEL 1;  Surgeon: Hosie Spangle, MD;  Location: [facility];  Service: Neurosurgery;  Laterality: Right;  Right Cervical seven thoracic one Cervical laminectomy/foraminotomy with posterior cervical arthrodesis   .  POSTERIOR LUMBAR FUSION N/A 05/23/2013   Procedure: exploration of lumbar wound, explantation of left interbody implant.;  Surgeon: Hosie Spangle, MD;  Location: [facility];  Service: Neurosurgery;  Laterality: N/A;  . RECTAL SURGERY     Fissure  . VITRECTOMY     right and left 2013     Home Medications:  Prior to Admission medications   Medication Sig Start Date End Date Taking? Authorizing Provider  carvedilol (COREG) 25 MG tablet TAKE 1 TABLET BY MOUTH 2 TIMES DAILY WITH A MEAL Patient taking differently: Take 25 mg by mouth 2 (two) times daily with a meal.  07/21/18  Yes Hoyt Koch, MD  [facility] VITAMIN B12 1000 MCG tablet TAKE 1 TABLET (1,000 MCG TOTAL) BY MOUTH DAILY. Patient taking differently: Take 1,000 mcg by mouth daily as needed (energy).  06/19/18  Yes Hoyt Koch, MD  finasteride (PROSCAR) 5 MG tablet Take 1 tablet (5 mg total) by mouth daily. Patient taking differently: Take 5 mg by mouth every evening.  11/11/17  Yes Hoyt Koch, MD  fluticasone [facility]) 50 MCG/ACT nasal spray Place 1 spray into both nostrils every evening.    Yes [provider]  hydrALAZINE (APRESOLINE) 50 MG tablet Take 1 tablet (50 mg total) by mouth 2 (two) times daily. 07/31/18  Yes Hoyt Koch, MD  losartan (COZAAR) 100 MG tablet TAKE 1 TABLET BY MOUTH EVERY DAY Patient taking differently: Take 100 mg by mouth daily.  04/01/18  Yes Hoyt Koch, MD  magnesium hydroxide (MILK OF MAGNESIA) 800 MG/5ML suspension Take 15 mLs by mouth daily as needed (leg cramps).    Yes [provider]  Oxycodone HCl 10 MG TABS Take 10 mg by mouth 3 (three) times daily as needed (pain).  07/14/14  Yes [provider]  predniSONE (DELTASONE) 20 MG tablet Take 2 tablets (40 mg total) by mouth daily with breakfast. Patient taking differently: Take 20-40 mg by mouth daily as needed (inflamation).  07/31/18  Yes Hoyt Koch, MD  tamsulosin (FLOMAX) 0.4  MG CAPS capsule TAKE 1 CAPSULE (0.4 MG TOTAL) BY MOUTH DAILY. Patient taking differently: Take 0.4 mg by mouth every morning.  01/24/18  Yes Hoyt Koch, MD  triamcinolone cream (KENALOG) 0.5 % USE AS DIRECTED Patient taking differently: Apply 1 application topically daily as needed (rash).  10/14/17  Yes Hoyt Koch, MD    Inpatient Medications: Scheduled Meds: . aspirin EC  81 mg Oral Daily  . carvedilol  25 mg Oral BID WC  . enoxaparin (LOVENOX) injection  40 mg Subcutaneous Q24H  . finasteride  5 mg Oral QPM  . fluticasone  1 spray Each Nare QPM  . furosemide  40 mg Intravenous BID  . hydrALAZINE  50 mg Oral BID  . sodium chloride flush  3 mL Intravenous Q12H  . tamsulosin  0.4 mg Oral BH-q7a   Continuous Infusions: . sodium chloride 250 mL (08/10/18 2124)  . nitroGLYCERIN 25 mcg/min (08/11/18 1044)   PRN Meds: sodium chloride, acetaminophen **OR** acetaminophen, HYDROcodone-acetaminophen, levalbuterol, ondansetron **OR** ondansetron (ZOFRAN) IV, oxyCODONE, sodium chloride flush  Allergies:    Allergies  Allergen Reactions  . Amlodipine     Ankle swelling  . Gabapentin     Lower extremity edema  . Lyrica [Pregabalin] Swelling    Social History:   Social History   Socioeconomic History  . Marital status: Married    Spouse name: Not on file  . Number of children: 3  . Years of education: 91  . Highest education level: Not on file  Occupational History  . Occupation: Retired  Scientific laboratory technician  . Financial resource strain: Not on file  . Food insecurity:    Worry: Not on file    Inability: Not on file  . Transportation needs:    Medical: Not on file    Non-medical: Not on file  Tobacco Use  . Smoking status: Former Smoker    Packs/day: 1.00    Years: 33.00    Pack years: 33.00    Last attempt to quit: 09/29/1981    Years since quitting: 36.8  . Smokeless tobacco: Never Used  Substance and Sexual Activity  . Alcohol use: Yes    Alcohol/week:  3.0 standard drinks    Types: 3 Standard drinks or equivalent per week    Comment: Social  . Drug use: No  . Sexual activity: Yes    Partners: Female    Comment: Married  Lifestyle  . Physical activity:    Days per week: Not on file    Minutes per session: Not on file  . Stress: Not on file  Relationships  . Social connections:    Talks on phone: Not on file    Gets together: Not on file    Attends religious service: Not on file    Active member of club or organization: Not on file    Attends meetings of clubs or organizations: Not on file    Relationship status: Not on file  . Intimate partner violence:    Fear of current or ex partner: Not on file    Emotionally abused: Not on file    Physically abused: Not on file    Forced sexual activity: Not on file  Other Topics Concern  . Not on file  Social History Narrative   Lives at home w/ his wife   Left-handed   Caffeine: 1 cup coffee daily   1 son alive, 2 sons deceased   Retired Building control surveyor, Furniture conservator/restorer.    Education: GED    Family History:    Family History  Problem Relation Age of Onset  . Dementia Mother   . Anemia Father   . Heart disease Father   . Stroke Sister        ICH  . Lung disease Sister      ROS:  Please see the history of present illness.  General:no colds or fevers, no weight changes Skin:no rashes or ulcers HEENT:no blurred vision, no congestion CV:see HPI PUL:see HPI GI:no diarrhea constipation or melena, no indigestion GU:no hematuria, no dysuria MS:+ knee pain, no claudication, + back pain  Neuro:no syncope, no lightheadedness Endo:no diabetes, no thyroid disease  All other ROS reviewed and negative.     Physical Exam/Data:   Vitals:   08/11/18 0500 08/11/18 0808 08/11/18 1147 08/11/18 1249  BP:  (!) 195/89 (!) 115/53 (!) 147/62  Pulse:  72  82  Resp: 14 10 17    Temp:  98.3 F (36.8 C)    TempSrc:  Oral    SpO2:  96%  93%  Weight: 92.9 kg     Height:        Intake/Output  Summary (Last 24 hours) at 08/11/2018 1341 Last data filed at 08/11/2018 1111 Gross per 24 hour  Intake 566.72 ml  Output 2075 ml  Net -1508.28 ml   Filed Weights   08/10/18 0854 08/11/18 0500  Weight: 95.3 kg 92.9 kg   Body mass index is 27.78 kg/m.  General:  Well nourished, well developed, in no acute distress HEENT: normal Lymph: no adenopathy Neck: no JVD Endocrine:  No thryomegaly Vascular: No carotid bruits; pedal pulses 1+ bilaterally  Cardiac:  normal S1, S2; RRR; no murmur, gallup rub or click.  Lungs:  Mostly clear to auscultation bilaterally, no wheezing,  occrhonchi no rales  Abd: soft, nontender, no hepatomegaly  Ext: tr to 1+ LOWER EXT edema Musculoskeletal:  No deformities, BUE and BLE strength normal and equal Skin: warm and dry  Neuro:  CNs 2-12 intact, no focal abnormalities noted Psych:  Normal affect   Relevant CV Studies: TTE 05/20/17 Study Conclusions  - Left ventricle: The cavity size was normal. Wall thickness was   normal. Systolic function was normal. The estimated ejection   fraction was in the range of 50% to 55%. Doppler parameters are   consistent with abnormal left ventricular relaxation (grade 1   diastolic dysfunction). Doppler parameters are consistent with   high ventricular filling pressure. - Mitral valve: There was mild regurgitation. - Left atrium: The atrium was moderately dilated.  TTE 08/10/18 Study Conclusions  - Left ventricle: The cavity size was mildly dilated. Wall   thickness was increased in a pattern of mild LVH. Systolic   function was normal. The estimated ejection fraction was in the   range of 55% to 60%. Doppler parameters are consistent with   abnormal left ventricular relaxation (grade 1 diastolic   dysfunction). - Aortic valve: Valve area (VTI): 3.24 cm^2. Valve area (Vmax):   3.01 cm^2. Valve area (Vmean): 3.18 cm^2. - Mitral valve: There was mild regurgitation. - Left atrium: The atrium was mildly  dilated. - Pericardium, extracardiac: A trivial pericardial effusion was   identified.  Impressions:  - No significant change from echo report of 2018.  Laboratory Data:  Chemistry Recent Labs  Lab 08/10/18 0905 08/11/18 0210  NA 139 138  K 4.7 3.8  CL 105 105  CO2 27 25  GLUCOSE 125* 99  BUN 29* 23  CREATININE 1.33* 1.25*  CALCIUM 8.5* 8.2*  GFRNONAA 49* 53*  GFRAA 56* >60  ANIONGAP 7 8    Recent Labs  Lab 08/11/18 0210  PROT 5.4*  ALBUMIN 3.1*  AST 16  ALT 17  ALKPHOS 57  BILITOT 0.6   Hematology Recent Labs  Lab 08/10/18 0905 08/11/18 0210  WBC 9.7 8.8  RBC 4.30 4.25  HGB 12.1* 11.7*  HCT 39.9 38.0*  MCV 92.8 89.4  MCH 28.1 27.5  MCHC 30.3 30.8  RDW 13.9 13.8  PLT 326 338   Cardiac Enzymes Recent Labs  Lab 08/10/18 0905 08/10/18 1420 08/10/18 2005 08/11/18 0210  TROPONINI <0.03 <0.03 <0.03 <0.03   No results for input(s): TROPIPOC in the last 168 hours.  BNP Recent Labs  Lab 08/10/18 0905  BNP 376.7*  DDimer  Recent Labs  Lab 08/10/18 0905  DDIMER 1.17*   Lab Results  Component Value Date   TSH 1.779 08/11/2018    Radiology/Studies:  Dg Chest 2 View  Result Date: 08/10/2018 CLINICAL DATA:  Chest pressure. EXAM: CHEST - 2 VIEW COMPARISON:  05/05/2013 FINDINGS: Vascular markings are mildly prominent but no overt pulmonary edema. Heart size is within normal limits and stable. Surgical changes in lower cervical spine. There appears to be pleural thickening along the posterior lower aspect of the chest on the lateral view. No large pleural effusion. Bridging osteophytes in thoracic spine. IMPRESSION: Vascular markings are mildly prominent but no overt pulmonary edema. Probable pleural thickening along the posterior lung bases. Electronically Signed   By: Markus Daft M.D.   On: 08/10/2018 10:13   Ct Angio Chest Pe W Or Wo Contrast  Result Date: 08/10/2018 CLINICAL DATA:  Pt c/o SOB and hypertension off and on for a couple weeks  Isovue 370 147ml^100mL ISOVUE-370 IOPAMIDOL (ISOVUE-370) INJECTION 76% EXAM: CT ANGIOGRAPHY CHEST WITH CONTRAST TECHNIQUE: Multidetector CT imaging of the chest was performed using the standard protocol during bolus administration of intravenous contrast. Multiplanar CT image reconstructions and MIPs were obtained to evaluate the vascular anatomy. CONTRAST:  126mL ISOVUE-370 IOPAMIDOL (ISOVUE-370) INJECTION 76% COMPARISON:  Chest x-ray on 08/10/2018 FINDINGS: Cardiovascular: Satisfactory opacification of the pulmonary arteries to the segmental level. No evidence of pulmonary embolism. Normal heart size. No pericardial effusion. There is coronary artery calcification. Heart size is normal. There is atherosclerotic calcification of the thoracic aorta not associated with aneurysm. Mildly tortuous descending aorta. Mediastinum/Nodes: No enlarged mediastinal, hilar, or axillary lymph nodes. Thyroid gland, trachea, and esophagus demonstrate no significant findings. Lungs/Pleura: There are bilateral pleural effusions. Thickened interseptal lines are identified primarily with a basilar predominance. There are dependent ground-glass opacities. Findings are consistent with mild pulmonary edema. No suspicious pulmonary nodules. Subsegmental atelectasis at the lung bases. No frank consolidations. Upper Abdomen: There is diffuse low-attenuation of the liver, consistent with hepatic steatosis. There is dense atherosclerotic calcification of the splenic artery. No acute abnormality in the UPPER abdomen. Musculoskeletal: Moderate degenerative changes in the thoracic spine. No suspicious lytic or blastic lesions are identified. Review of the MIP images confirms the above findings. IMPRESSION: 1. Technically adequate exam showing no acute pulmonary embolus. 2. Mild pulmonary edema. 3. Coronary artery disease. 4. Hepatic steatosis. Aortic atherosclerosis.  (ICD10-I70.0) Electronically Signed   By: Nolon Nations M.D.   On:  08/10/2018 12:23   US Renal  Result Date: 08/10/2018 CLINICAL DATA:  Elevated serum creatinine. EXAM: RENAL / URINARY TRACT ULTRASOUND COMPLETE COMPARISON:  None. FINDINGS: Right Kidney: Renal measurements: 11.2 x 7.0 x 5.5 cm = volume: 224 mL. Cortical thinning is noted. Echogenicity within normal limits. No mass or hydronephrosis visualized. Left Kidney: Renal measurements: 12.6 x 5.2 x 5.0 cm = volume: 170 mL. Cortical thinning is noted. Echogenicity within normal limits. No mass or hydronephrosis visualized. Bladder: Appears normal for degree of bladder distention. IMPRESSION: Bilateral renal cortical thinning is noted. No hydronephrosis or renal obstruction is noted. Electronically Signed   By: Marijo Conception, M.D.   On: 08/10/2018 18:16   Vas US Renal Artery Duplex  Result Date: 08/11/2018 ABDOMINAL VISCERAL Indications: Hypertension High Risk Factors: Hypertension. Limitations: Air/bowel gas. Performing Technologist: Abram Sander RVS  Examination Guidelines: A complete evaluation includes B-mode imaging, spectral Doppler, color Doppler, and power Doppler as needed of all accessible portions of each vessel. Bilateral testing is considered an integral  part of a complete examination. Limited examinations for reoccurring indications may be performed as noted.  Duplex Findings: +----------------------+--------+--------+------+--------+ Mesenteric            PSV cm/sEDV cm/sPlaqueComments +----------------------+--------+--------+------+--------+ Aorta Prox               56                          +----------------------+--------+--------+------+--------+ Celiac Artery Proximal  110                          +----------------------+--------+--------+------+--------+ SMA Origin              149                          +----------------------+--------+--------+------+--------+  +------------------+--------+--------+-----------+ Right Renal ArteryPSV cm/sEDV cm/s  Comment    +------------------+--------+--------+-----------+ Origin               58      14   poorly seen +------------------+--------+--------+-----------+ Proximal             53      12               +------------------+--------+--------+-----------+ Mid                  56      14               +------------------+--------+--------+-----------+ Distal               56      15               +------------------+--------+--------+-----------+ +-----------------+--------+--------+-----------+ Left Renal ArteryPSV cm/sEDV cm/s  Comment   +-----------------+--------+--------+-----------+ Origin              82      14   poorly seen +-----------------+--------+--------+-----------+ Proximal            56      16               +-----------------+--------+--------+-----------+ Mid                 50      12               +-----------------+--------+--------+-----------+ Distal              38      9                +-----------------+--------+--------+-----------+ Technologist observations Renal Artery(s):Bilateral increase echogencity for both kidneys. +------------+--------+--------+----+-----------+--------+--------+----+ Right KidneyPSV cm/sEDV cm/sRI  Left KidneyPSV cm/sEDV cm/sRI   +------------+--------+--------+----+-----------+--------+--------+----+ Upper Pole  27      7       0.75Upper Pole 21      4       0.80 +------------+--------+--------+----+-----------+--------+--------+----+ Mid         24      6       0.76Mid        16      7       0.60 +------------+--------+--------+----+-----------+--------+--------+----+ Lower Pole  20      6       0.72Lower Pole 17      6       0.65 +------------+--------+--------+----+-----------+--------+--------+----+ Hilar       45      15  0.67Hilar      14      5       0.64 +------------+--------+--------+----+-----------+--------+--------+----+  +------------------+-----+------------------+-----+ Right Kidney           Left Kidney             +------------------+-----+------------------+-----+ RAR                    RAR                     +------------------+-----+------------------+-----+ RAR (manual)           RAR (manual)            +------------------+-----+------------------+-----+ Cortex            0.672Cortex            0.878 +------------------+-----+------------------+-----+ Cortex thickness       Corex thickness         +------------------+-----+------------------+-----+ Kidney length (cm)12.20Kidney length (cm)11.90 +------------------+-----+------------------+-----+   Summary: Renal:  Right: No evidence of right renal artery stenosis. Normal size right        kidney. Abnormal right Resistive Index. Abnormal cortical        thickness of right kidney. Left:  No evidence of left renal artery stenosis. Normal size of        left kidney. Abnormal left Resisitve Index. Abnormal cortical        thickness of the left kidney. Mesenteric: Normal Superior Mesenteric artery and Celiac artery findings.  *See table(s) above for measurements and observations.     Preliminary     Assessment and Plan:   1. Hypertensive urgency with neg MI, now BP with improved control but does remain labile.  On coreg, lasix 40 IV BID, hydralazine 50 BID--losartan 100 daily on hold.   2. Acute diastolic HF with combined HTN urgency.  On lasix 40 mg IV BID negative 1508 and wt down from 95.3 Kg to 92.9 Kg.  (204.38)  3.  CAD  by CTA alone.  No cardiac workup that he is aware of.  + chest pressure with this admit neg MI.  Chest pressure on admit most likely due to HTN.  May be prudent to do stress test.  Dr. [name] to see.    4. Hx HLD not on statin at home.       For questions or updates, please contact [place] Please consult [email or web address] for contact info under     Signed, Cecilie Kicks, NP  08/11/2018 1:41 PM   History  and all data above reviewed.  Patient examined.  I agree with the findings as above.   The patient presents with difficult BP.  He has had this for some time with recent adjustments to his meds as above.  He has most recently been on increased doses of BID hydralazine.  Presents with SOB/DOE and is found to have hypertensive urgency.  Also complains of nasal congestion when he is lying flat.  No past cardiac history.  Currently BP is controlled but on IV NTG.  The patient exam reveals COR:RRR  ,  Lungs: Clear  ,  Abd: Positive bowel sounds, no rebound no guarding, Ext No edema  .  All available labs, radiology testing, previous records reviewed. Agree with documented assessment and plan.   HTN:  I had a long discussion with the patient and sent a detailed staff message to Hoyt Koch, MD.  His BP control will take educated "trial and error."  I don't suspect a secondary cause although at this point.  He will start to keep a detailed BP diary for his PCP.  I have asked nursing to reduce and then discontinue the IV NTG.  I will restart moderate dose Norvasc today and start spironolactone.  I would stop the hydralazine before discharge as I don't think this is a good BID drug.  He could use this PRN SBPs greater than 180 at home while we are adjusting the meds.  (ie 12.5 mg prn SBP greater than 180 on two repeat readings 15 minutes apart.)    Minus Breeding  3:45 PM  08/11/2018

## 2018-08-11 NOTE — Progress Notes (Signed)
Renal artery duplex has been completed.   Preliminary results in [facility] Proc.   Abram Sander 08/11/2018 11:21 AM

## 2018-08-12 DIAGNOSIS — I10 Essential (primary) hypertension: Secondary | ICD-10-CM | POA: Diagnosis not present

## 2018-08-12 DIAGNOSIS — I5031 Acute diastolic (congestive) heart failure: Secondary | ICD-10-CM

## 2018-08-12 DIAGNOSIS — R7989 Other specified abnormal findings of blood chemistry: Secondary | ICD-10-CM | POA: Diagnosis not present

## 2018-08-12 DIAGNOSIS — R0789 Other chest pain: Secondary | ICD-10-CM | POA: Diagnosis not present

## 2018-08-12 LAB — BASIC METABOLIC PANEL
Anion gap: 8 (ref 5–15)
BUN: 21 mg/dL (ref 8–23)
CO2: 26 mmol/L (ref 22–32)
Calcium: 8.7 mg/dL — ABNORMAL LOW (ref 8.9–10.3)
Chloride: 106 mmol/L (ref 98–111)
Creatinine, Ser: 1.33 mg/dL — ABNORMAL HIGH (ref 0.61–1.24)
GFR calc Af Amer: 56 mL/min — ABNORMAL LOW (ref >60)
GFR calc non Af Amer: 49 mL/min — ABNORMAL LOW (ref >60)
Glucose, Bld: 130 mg/dL — ABNORMAL HIGH (ref 70–99)
Potassium: 4.4 mmol/L (ref 3.5–5.1)
Sodium: 140 mmol/L (ref 135–145)

## 2018-08-12 LAB — GLUCOSE, CAPILLARY: Glucose-Capillary: 80 mg/dL (ref 70–99)

## 2018-08-12 MED ORDER — FUROSEMIDE 20 MG PO TABS: ORAL_TABLET | ORAL | 0 refills | Status: DC

## 2018-08-12 MED ORDER — HYDRALAZINE HCL 50 MG PO TABS: 50.0000 mg | ORAL_TABLET | Freq: Three times a day (TID) | ORAL | 0 refills | Status: DC

## 2018-08-12 MED ORDER — HYDRALAZINE HCL 50 MG PO TABS
50.0000 mg | ORAL_TABLET | Freq: Three times a day (TID) | ORAL | Status: DC
  Administered 2018-08-12: 50 mg via ORAL
  Filled 2018-08-12: qty 1

## 2018-08-12 MED ORDER — FUROSEMIDE 10 MG/ML IJ SOLN
40.0000 mg | Freq: Once | INTRAMUSCULAR | Status: AC
  Administered 2018-08-12: 40 mg via INTRAVENOUS
  Filled 2018-08-12: qty 4

## 2018-08-12 MED ORDER — SPIRONOLACTONE 25 MG PO TABS: 25.0000 mg | ORAL_TABLET | Freq: Every day | ORAL | Status: DC

## 2018-08-12 MED ORDER — AMLODIPINE BESYLATE 10 MG PO TABS: 10.0000 mg | ORAL_TABLET | Freq: Every day | ORAL | 0 refills | Status: DC

## 2018-08-12 MED ORDER — AMLODIPINE BESYLATE 10 MG PO TABS: 10.0000 mg | ORAL_TABLET | Freq: Every day | ORAL | Status: DC

## 2018-08-12 MED ORDER — SPIRONOLACTONE 25 MG PO TABS: 25.0000 mg | ORAL_TABLET | Freq: Every day | ORAL | 0 refills | Status: DC

## 2018-08-12 NOTE — Discharge Instructions (Signed)

## 2018-08-12 NOTE — Progress Notes (Signed)
[name] to be D/C'd Home per MD order. Discussed with the patient and all questions fully answered.    IV catheter discontinued intact. Site without signs and symptoms of complications. Dressing and pressure applied.  An After Visit Summary was printed and given to the patient.  Patient escorted via [place], and D/C home via private auto.  [name]  08/12/2018 2:59 PM

## 2018-08-12 NOTE — Discharge Summary (Signed)
DISCHARGE SUMMARY  [name]  MR#: [id number]  DOB:06-Oct-1931  Date of Admission: 08/10/2018 Date of Discharge: 08/12/2018  Attending Physician:Jordan Ciampa Hennie Duos, MD  Patient's QQP:[id number], Real Cons, MD  Consults:  [facility] Cardiology   Disposition: D/C home   Follow-up Appts: Follow-up Information    Jordan Koch, MD Follow up in 1 week(s).   Specialty:  Internal Medicine Contact information: [place] [address] [phone]        Jordan Breeding, MD Follow up in 2 week(s).   Specialty:  Cardiology Contact information: [address] [place] [address] [phone]           Tests Needing Follow-up: -assess BP control - further titration of meds likely to be required -f/u renal fxn   Discharge Diagnoses: Uncontrolled HTN Acute diastolic CHF Acute kidney injury   HLD BPH  Initial presentation: 84 y.o.malew/ a hx of back pain, uncontrolled HTN, and a "blood clot" who presented w/ intermittent chest heaviness and trouble breathing.   Hospital Course:  Uncontrolled HTN No RAS on renal artery duplex - TTE w/o acute findings - medical tx has been adjusted significantly, w/ addition of new meds as well - SBP improved to 150s at time of d/c - pt has been instructed to keep a BP diary w/ readings TID - he is to f/u w/ his PCP as well as Dr. Percival [name] for review of this diary and further med titration   Acute diastolic CHF TTE noted EF 55-60% w/ grade 1 DD and mild LVH (stable since 2018) - doing well w/ intermittent lasix dosing - Cards counseled pt on use of lasix 20-40mg  QD prn   Acute kidney injury   Likely hypertensive in nature - crt stable at 1.25-1.33 at time of d/c   HLD Cont home medical tx  BPH Cont home medical tx   Allergies as of 08/12/2018      Reactions   Amlodipine    Ankle swelling   Gabapentin    Lower extremity edema   Lyrica [pregabalin] Swelling      Medication List      STOP taking these medications   losartan 100 MG tablet Commonly known as:  COZAAR   predniSONE 20 MG tablet Commonly known as:  DELTASONE     TAKE these medications   amLODipine 10 MG tablet Commonly known as:  NORVASC Take 1 tablet (10 mg total) by mouth daily. Start taking on:  August 13, 2018   carvedilol 25 MG tablet Commonly known as:  COREG TAKE 1 TABLET BY MOUTH 2 TIMES DAILY WITH A MEAL What changed:  See the new instructions.   [employer] VITAMIN B12 1000 MCG tablet Generic drug:  cyanocobalamin TAKE 1 TABLET (1,000 MCG TOTAL) BY MOUTH DAILY. What changed:  See the new instructions.   finasteride 5 MG tablet Commonly known as:  PROSCAR Take 1 tablet (5 mg total) by mouth daily. What changed:  when to take this   fluticasone 50 MCG/ACT nasal spray Commonly known as:  FLONASE Place 1 spray into both nostrils every evening.   furosemide 20 MG tablet Commonly known as:  LASIX Take 1 or 2 tablets a day as needed for shortness of breath or swelling in the legs / feet.   hydrALAZINE 50 MG tablet Commonly known as:  APRESOLINE Take 1 tablet (50 mg total) by mouth every 8 (eight) hours. What changed:  when to take this   magnesium hydroxide 800 MG/5ML  suspension Commonly known as:  MILK OF MAGNESIA Take 15 mLs by mouth daily as needed (leg cramps).   Oxycodone HCl 10 MG Tabs Take 10 mg by mouth 3 (three) times daily as needed (pain).   spironolactone 25 MG tablet Commonly known as:  ALDACTONE Take 1 tablet (25 mg total) by mouth daily. Start taking on:  August 13, 2018   tamsulosin 0.4 MG Caps capsule Commonly known as:  FLOMAX TAKE 1 CAPSULE (0.4 MG TOTAL) BY MOUTH DAILY. What changed:    how much to take  how to take this  when to take this  additional instructions   triamcinolone cream 0.5 % Commonly known as:  KENALOG USE AS DIRECTED What changed:    how much to take  how to take this  when to take this  reasons to take this        Day of Discharge BP (!) 151/61 (BP Location: Left Arm)   Pulse 77   Temp 97.6 F (36.4 C) (Oral)   Resp 19   Ht 6' (1.829 m)   Wt 92.7 kg   SpO2 96%   BMI 27.71 kg/m   Physical Exam: General: No acute respiratory distress Lungs: Clear to auscultation bilaterally without wheezes or crackles Cardiovascular: Regular rate and rhythm without murmur gallop or rub normal S1 and S2 Abdomen: Nontender, nondistended, soft, bowel sounds positive, no rebound, no ascites, no appreciable mass Extremities: No significant cyanosis, clubbing, or edema bilateral lower extremities  Basic Metabolic Panel: Recent Labs  Lab 08/10/18 0905 08/11/18 0210 08/12/18 0230  NA 139 138 140  K 4.7 3.8 4.4  CL 105 105 106  CO2 27 25 26   GLUCOSE 125* 99 130*  BUN 29* 23 21  CREATININE 1.33* 1.25* 1.33*  CALCIUM 8.5* 8.2* 8.7*  MG  --  2.2  --   PHOS  --  3.6  --     Liver Function Tests: Recent Labs  Lab 08/11/18 0210  AST 16  ALT 17  ALKPHOS 57  BILITOT 0.6  PROT 5.4*  ALBUMIN 3.1*    CBC: Recent Labs  Lab 08/10/18 0905 08/11/18 0210  WBC 9.7 8.8  HGB 12.1* 11.7*  HCT 39.9 38.0*  MCV 92.8 89.4  PLT 326 338    Cardiac Enzymes: Recent Labs  Lab 08/10/18 0905 08/10/18 1420 08/10/18 2005 08/11/18 0210  TROPONINI <0.03 <0.03 <0.03 <0.03   BNP (last 3 results) Recent Labs    08/10/18 0905  BNP 376.7*     Time spent in discharge (includes decision making & examination of pt): 35 minutes  08/12/2018, 1:52 PM   Cherene Altes, MD [facility] Office  [phone] Pager [phone]  On-Call/Text Page:      [email or web address]      password [facility]

## 2018-08-12 NOTE — Progress Notes (Signed)
Progress Note  Patient Name: [name] Date of Encounter: 08/12/2018  Primary Cardiologist:   No primary care provider on file.   Subjective   No pain.  Still some SOB when lying flat.    Inpatient Medications    Scheduled Meds: . [START ON 08/13/2018] amLODipine  10 mg Oral Daily  . aspirin EC  81 mg Oral Daily  . carvedilol  25 mg Oral BID WC  . enoxaparin (LOVENOX) injection  40 mg Subcutaneous Q24H  . finasteride  5 mg Oral QPM  . fluticasone  1 spray Each Nare QPM  . sodium chloride flush  3 mL Intravenous Q12H  . [START ON 08/13/2018] spironolactone  25 mg Oral Daily  . tamsulosin  0.4 mg Oral BH-q7a   Continuous Infusions: . sodium chloride Stopped (08/11/18 2112)   PRN Meds: sodium chloride, acetaminophen, HYDROcodone-acetaminophen, levalbuterol, ondansetron **OR** ondansetron (ZOFRAN) IV, sodium chloride flush   Vital Signs    Vitals:   08/11/18 1819 08/11/18 1952 08/11/18 2336 08/12/18 0428  BP: (!) 167/78 (!) 182/77 (!) 197/89 (!) 196/89  Pulse: 74 73 70 72  Resp: 13 14 (!) 21 15  Temp:  97.8 F (36.6 C) 98.2 F (36.8 C) 97.6 F (36.4 C)  TempSrc:  Oral Oral Oral  SpO2:  95% 91% 91%  Weight:    92.7 kg  Height:        Intake/Output Summary (Last 24 hours) at 08/12/2018 0919 Last data filed at 08/11/2018 2114 Gross per 24 hour  Intake 259.88 ml  Output 1225 ml  Net -965.12 ml   Filed Weights   08/10/18 0854 08/11/18 0500 08/12/18 0428  Weight: 95.3 kg 92.9 kg 92.7 kg    Telemetry    NSR - Personally Reviewed  ECG    NA - Personally Reviewed  Physical Exam   GEN: No acute distress.   Neck: No  JVD Cardiac: RRR, no murmurs, rubs, or gallops.  Respiratory:   Bilateral basilar crackles. GI: Soft, nontender, non-distended  MS:   Mild ankle edema; No deformity. Neuro:  Nonfocal  Psych: Normal affect   Labs    Chemistry Recent Labs  Lab 08/10/18 0905 08/11/18 0210 08/12/18 0230  NA 139 138 140  K 4.7 3.8 4.4  CL 105 105  106  CO2 27 25 26   GLUCOSE 125* 99 130*  BUN 29* 23 21  CREATININE 1.33* 1.25* 1.33*  CALCIUM 8.5* 8.2* 8.7*  PROT  --  5.4*  --   ALBUMIN  --  3.1*  --   AST  --  16  --   ALT  --  17  --   ALKPHOS  --  57  --   BILITOT  --  0.6  --   GFRNONAA 49* 53* 49*  GFRAA 56* >60 56*  ANIONGAP 7 8 8      Hematology Recent Labs  Lab 08/10/18 0905 08/11/18 0210  WBC 9.7 8.8  RBC 4.30 4.25  HGB 12.1* 11.7*  HCT 39.9 38.0*  MCV 92.8 89.4  MCH 28.1 27.5  MCHC 30.3 30.8  RDW 13.9 13.8  PLT 326 338    Cardiac Enzymes Recent Labs  Lab 08/10/18 0905 08/10/18 1420 08/10/18 2005 08/11/18 0210  TROPONINI <0.03 <0.03 <0.03 <0.03   No results for input(s): TROPIPOC in the last 168 hours.   BNP Recent Labs  Lab 08/10/18 0905  BNP 376.7*     DDimer  Recent Labs  Lab 08/10/18 0905  DDIMER 1.17*  Radiology    Dg Chest 2 View  Result Date: 08/10/2018 CLINICAL DATA:  Chest pressure. EXAM: CHEST - 2 VIEW COMPARISON:  05/05/2013 FINDINGS: Vascular markings are mildly prominent but no overt pulmonary edema. Heart size is within normal limits and stable. Surgical changes in lower cervical spine. There appears to be pleural thickening along the posterior lower aspect of the chest on the lateral view. No large pleural effusion. Bridging osteophytes in thoracic spine. IMPRESSION: Vascular markings are mildly prominent but no overt pulmonary edema. Probable pleural thickening along the posterior lung bases. Electronically Signed   By: Markus Daft M.D.   On: 08/10/2018 10:13   Ct Angio Chest Pe W Or Wo Contrast  Result Date: 08/10/2018 CLINICAL DATA:  Pt c/o SOB and hypertension off and on for a couple weeks Isovue 370 161ml^100mL ISOVUE-370 IOPAMIDOL (ISOVUE-370) INJECTION 76% EXAM: CT ANGIOGRAPHY CHEST WITH CONTRAST TECHNIQUE: Multidetector CT imaging of the chest was performed using the standard protocol during bolus administration of intravenous contrast. Multiplanar CT image  reconstructions and MIPs were obtained to evaluate the vascular anatomy. CONTRAST:  180mL ISOVUE-370 IOPAMIDOL (ISOVUE-370) INJECTION 76% COMPARISON:  Chest x-ray on 08/10/2018 FINDINGS: Cardiovascular: Satisfactory opacification of the pulmonary arteries to the segmental level. No evidence of pulmonary embolism. Normal heart size. No pericardial effusion. There is coronary artery calcification. Heart size is normal. There is atherosclerotic calcification of the thoracic aorta not associated with aneurysm. Mildly tortuous descending aorta. Mediastinum/Nodes: No enlarged mediastinal, hilar, or axillary lymph nodes. Thyroid gland, trachea, and esophagus demonstrate no significant findings. Lungs/Pleura: There are bilateral pleural effusions. Thickened interseptal lines are identified primarily with a basilar predominance. There are dependent ground-glass opacities. Findings are consistent with mild pulmonary edema. No suspicious pulmonary nodules. Subsegmental atelectasis at the lung bases. No frank consolidations. Upper Abdomen: There is diffuse low-attenuation of the liver, consistent with hepatic steatosis. There is dense atherosclerotic calcification of the splenic artery. No acute abnormality in the UPPER abdomen. Musculoskeletal: Moderate degenerative changes in the thoracic spine. No suspicious lytic or blastic lesions are identified. Review of the MIP images confirms the above findings. IMPRESSION: 1. Technically adequate exam showing no acute pulmonary embolus. 2. Mild pulmonary edema. 3. Coronary artery disease. 4. Hepatic steatosis. Aortic atherosclerosis.  (ICD10-I70.0) Electronically Signed   By: Nolon Nations M.D.   On: 08/10/2018 12:23   US Renal  Result Date: 08/10/2018 CLINICAL DATA:  Elevated serum creatinine. EXAM: RENAL / URINARY TRACT ULTRASOUND COMPLETE COMPARISON:  None. FINDINGS: Right Kidney: Renal measurements: 11.2 x 7.0 x 5.5 cm = volume: 224 mL. Cortical thinning is noted.  Echogenicity within normal limits. No mass or hydronephrosis visualized. Left Kidney: Renal measurements: 12.6 x 5.2 x 5.0 cm = volume: 170 mL. Cortical thinning is noted. Echogenicity within normal limits. No mass or hydronephrosis visualized. Bladder: Appears normal for degree of bladder distention. IMPRESSION: Bilateral renal cortical thinning is noted. No hydronephrosis or renal obstruction is noted. Electronically Signed   By: Marijo Conception, M.D.   On: 08/10/2018 18:16   Vas US Renal Artery Duplex  Result Date: 08/11/2018 ABDOMINAL VISCERAL Indications: Hypertension High Risk Factors: Hypertension. Limitations: Air/bowel gas. Performing Technologist: Abram Sander RVS  Examination Guidelines: A complete evaluation includes B-mode imaging, spectral Doppler, color Doppler, and power Doppler as needed of all accessible portions of each vessel. Bilateral testing is considered an integral part of a complete examination. Limited examinations for reoccurring indications may be performed as noted.  Duplex Findings: +----------------------+--------+--------+------+--------+ Mesenteric  PSV cm/sEDV cm/sPlaqueComments +----------------------+--------+--------+------+--------+ Aorta Prox               56                          +----------------------+--------+--------+------+--------+ Celiac Artery Proximal  110                          +----------------------+--------+--------+------+--------+ SMA Origin              149                          +----------------------+--------+--------+------+--------+  +------------------+--------+--------+-----------+ Right Renal ArteryPSV cm/sEDV cm/s  Comment   +------------------+--------+--------+-----------+ Origin               58      14   poorly seen +------------------+--------+--------+-----------+ Proximal             53      12               +------------------+--------+--------+-----------+ Mid                  56       14               +------------------+--------+--------+-----------+ Distal               56      15               +------------------+--------+--------+-----------+ +-----------------+--------+--------+-----------+ Left Renal ArteryPSV cm/sEDV cm/s  Comment   +-----------------+--------+--------+-----------+ Origin              82      14   poorly seen +-----------------+--------+--------+-----------+ Proximal            56      16               +-----------------+--------+--------+-----------+ Mid                 50      12               +-----------------+--------+--------+-----------+ Distal              38      9                +-----------------+--------+--------+-----------+ Technologist observations Renal Artery(s):Bilateral increase echogencity for both kidneys. +------------+--------+--------+----+-----------+--------+--------+----+ Right KidneyPSV cm/sEDV cm/sRI  Left KidneyPSV cm/sEDV cm/sRI   +------------+--------+--------+----+-----------+--------+--------+----+ Upper Pole  27      7       0.75Upper Pole 21      4       0.80 +------------+--------+--------+----+-----------+--------+--------+----+ Mid         24      6       0.76Mid        16      7       0.60 +------------+--------+--------+----+-----------+--------+--------+----+ Lower Pole  20      6       0.72Lower Pole 17      6       0.65 +------------+--------+--------+----+-----------+--------+--------+----+ Hilar       45      15      0.67Hilar      14      5       0.64 +------------+--------+--------+----+-----------+--------+--------+----+ +------------------+-----+------------------+-----+ Right Kidney  Left Kidney             +------------------+-----+------------------+-----+ RAR                    RAR                     +------------------+-----+------------------+-----+ RAR (manual)           RAR (manual)             +------------------+-----+------------------+-----+ Cortex            0.672Cortex            0.878 +------------------+-----+------------------+-----+ Cortex thickness       Corex thickness         +------------------+-----+------------------+-----+ Kidney length (cm)12.20Kidney length (cm)11.90 +------------------+-----+------------------+-----+  Summary: Renal:  Right: No evidence of right renal artery stenosis. Normal size right        kidney. Abnormal right Resistive Index. Abnormal cortical        thickness of right kidney. Left:  No evidence of left renal artery stenosis. Normal size of        left kidney. Abnormal left Resisitve Index. Abnormal cortical        thickness of the left kidney. Mesenteric: Normal Superior Mesenteric artery and Celiac artery findings.  *See table(s) above for measurements and observations.  Diagnosing physician: Deitra Mayo MD  Electronically signed by Deitra Mayo MD on 08/11/2018 at 5:14:10 PM.    Final     Cardiac Studies   08/10/18  Study Conclusions  - Left ventricle: The cavity size was mildly dilated. Wall   thickness was increased in a pattern of mild LVH. Systolic   function was normal. The estimated ejection fraction was in the   range of 55% to 60%. Doppler parameters are consistent with   abnormal left ventricular relaxation (grade 1 diastolic   dysfunction). - Aortic valve: Valve area (VTI): 3.24 cm^2. Valve area (Vmax):   3.01 cm^2. Valve area (Vmean): 3.18 cm^2. - Mitral valve: There was mild regurgitation. - Left atrium: The atrium was mildly dilated. - Pericardium, extracardiac: A trivial pericardial effusion was   identified.  Patient Profile     84 y.o. male with a hx of HTN, back pain, HLD  who is being seen for the evaluation of CHF at the request of Dr. Thereasa Solo.  Assessment & Plan    HYPERTENSIVE URGENCY:   Off of IV NTG.   Please see my note from yesterday.  His BP is very high again today.  He wants  to look for a window of opportunity to go home today and I would agree with this as his med adjustments can be done at home.  While I don't want to use hydralazine tid long term  I will increase this today to TID.  If BP can be reasonable today (below 150/100 or so) home and he needs to keep his BP diary 3 x per day.  He is welcome to send me via my chart those readings and we can adjust his meds as needed.  I have not yet heard back from his Hoyt Koch, MD.  He would like for her to be in control of the BP management.    ACUTE DIASTOLIC HF:  I will give one dose of IV Lasix today as I think that he has some increased volume.  I would suggest that he go home with PO 20 to 40 mg Lasix prn.  He and I  talked about the use of this and salt and fluid restriction.   For questions or updates, please contact [facility] Please consult [email or web address] for contact info under Cardiology/STEMI.   Signed, Minus Breeding, MD  08/12/2018, 9:19 AM

## 2018-08-14 ENCOUNTER — Encounter: Payer: Self-pay | Admitting: Internal Medicine

## 2018-08-14 ENCOUNTER — Telehealth: Payer: Self-pay

## 2018-08-14 NOTE — Telephone Encounter (Signed)
Transition Care Management Follow-up Telephone Call   Date discharged? 08/12/2018   How have you been since you were released from the hospital? Feeling well.    Do you understand why you were in the hospital? Yes   Do you understand the discharge instructions? Yes   Where were you discharged to? Home  Items Reviewed:  Medications reviewed: Yes  Allergies reviewed: Yes  Dietary changes reviewed: n/a  Referrals reviewed: n/a  Functional Questionnaire:   Activities of Daily Living (ADLs):   States they are independent in the following: All ADL's       States they require assistance with the following: n/a   Any transportation issues/concerns?: Not at this time   Any patient concerns? Not at this time.    Confirmed importance and date/time of follow-up visits scheduled YES  Provider Appointment booked with - pt will call back.   Confirmed with patient if condition begins to worsen call PCP or go to the ER.  Patient was given the office number and encouraged to call back with question or concerns: Yes

## 2018-08-18 ENCOUNTER — Other Ambulatory Visit (INDEPENDENT_AMBULATORY_CARE_PROVIDER_SITE_OTHER): Payer: Medicare Other

## 2018-08-18 ENCOUNTER — Ambulatory Visit (INDEPENDENT_AMBULATORY_CARE_PROVIDER_SITE_OTHER): Payer: Medicare Other | Admitting: Internal Medicine

## 2018-08-18 ENCOUNTER — Encounter: Payer: Self-pay | Admitting: Internal Medicine

## 2018-08-18 VITALS — BP 112/64 | HR 78 | Temp 98.5°F | Ht 72.0 in | Wt 202.0 lb

## 2018-08-18 DIAGNOSIS — I10 Essential (primary) hypertension: Secondary | ICD-10-CM | POA: Diagnosis not present

## 2018-08-18 DIAGNOSIS — I5031 Acute diastolic (congestive) heart failure: Secondary | ICD-10-CM | POA: Diagnosis not present

## 2018-08-18 DIAGNOSIS — Z23 Encounter for immunization: Secondary | ICD-10-CM

## 2018-08-18 LAB — COMPREHENSIVE METABOLIC PANEL
ALT: 12 U/L (ref 0–53)
AST: 13 U/L (ref 0–37)
Albumin: 4 g/dL (ref 3.5–5.2)
Alkaline Phosphatase: 68 U/L (ref 39–117)
BUN: 40 mg/dL — ABNORMAL HIGH (ref 6–23)
CO2: 27 mEq/L (ref 19–32)
Calcium: 9.6 mg/dL (ref 8.4–10.5)
Chloride: 103 mEq/L (ref 96–112)
Creatinine, Ser: 1.35 mg/dL (ref 0.40–1.50)
GFR: 53.39 mL/min — ABNORMAL LOW (ref >60.00)
Glucose, Bld: 99 mg/dL (ref 70–99)
Potassium: 4.7 mEq/L (ref 3.5–5.1)
Sodium: 136 mEq/L (ref 135–145)
Total Bilirubin: 0.5 mg/dL (ref 0.2–1.2)
Total Protein: 6.4 g/dL (ref 6.0–8.3)

## 2018-08-18 NOTE — Patient Instructions (Signed)
I will communicate with Dr. [name] to make sure he is happy with the blood pressure.   We will not make any changes today.

## 2018-08-18 NOTE — Progress Notes (Signed)
   Subjective:   Patient ID: [name], male    DOB: 01/11/1932, 85 y.o.   MRN: [id number]  HPI The patient is an 85 YO man coming in for hospital follow up (in for hypertensive urgency/emergency and given IV medications to bring down pressure as well as titration of regimen for control). He is taking amlodipine 10 mg, lasix 20 mg daily, hydralazine 50 mg TID and spironolactone 25 mg daily. Denies side effects. BP is doing well at home and he is checking around 115/60-120/60 maximum readings. Denies chest pains or pressure. Denies headaches. Denies abdominal pain or nausea or vomiting, denies dizziness or lightheadedness. Overall is doing well since being home. Feels better than before being in the hospital. He does have chronic arthritis pains but they were not worse than usual prior to hospital stay.   PMH, [facility], social history reviewed and updated.   Review of Systems  Constitutional: Negative.   HENT: Negative.   Eyes: Negative.   Respiratory: Negative for cough, chest tightness and shortness of breath.   Cardiovascular: Negative for chest pain, palpitations and leg swelling.  Gastrointestinal: Negative for abdominal distention, abdominal pain, constipation, diarrhea, nausea and vomiting.  Musculoskeletal: Negative.   Skin: Negative.   Neurological: Negative.   Psychiatric/Behavioral: Negative.     Objective:  Physical Exam Constitutional:      Appearance: He is well-developed.  HENT:     Head: Normocephalic and atraumatic.  Neck:     Musculoskeletal: Normal range of motion.  Cardiovascular:     Rate and Rhythm: Normal rate and regular rhythm.  Pulmonary:     Effort: Pulmonary effort is normal. No respiratory distress.     Breath sounds: Normal breath sounds. No wheezing or rales.  Abdominal:     General: Bowel sounds are normal. There is no distension.     Palpations: Abdomen is soft.     Tenderness: There is no abdominal tenderness. There is no rebound.    Musculoskeletal:        General: Swelling and deformity present.  Skin:    General: Skin is warm and dry.  Neurological:     Mental Status: He is alert and oriented to person, place, and time.     Coordination: Coordination normal.     Vitals:   08/18/18 1504  BP: 112/64  Pulse: 78  Temp: 98.5 F (36.9 C)  TempSrc: Oral  SpO2: 93%  Weight: 202 lb (91.6 kg)  Height: 6' (1.829 m)    Assessment & Plan:  Flu shot given at visit

## 2018-08-19 NOTE — Assessment & Plan Note (Signed)
With IV blood pressure medications and diuresis in the hospital he has done well. Weight is stable at home. Checking CMP today and adjust as needed.

## 2018-08-19 NOTE — Assessment & Plan Note (Signed)
BP is doing well today and per home records. Will maintain lasix, spironolactone, amlodipine and hydralazine. Checking CMP today and adjust as needed.

## 2018-08-26 ENCOUNTER — Encounter: Payer: Self-pay | Admitting: Internal Medicine

## 2018-08-26 ENCOUNTER — Other Ambulatory Visit: Payer: Self-pay | Admitting: Internal Medicine

## 2018-08-27 ENCOUNTER — Encounter: Payer: Self-pay | Admitting: Internal Medicine

## 2018-09-01 ENCOUNTER — Encounter: Payer: Self-pay | Admitting: Sports Medicine

## 2018-09-01 MED ORDER — PREDNISONE 5 MG PO TABS: ORAL_TABLET | ORAL | 0 refills | Status: AC

## 2018-09-09 ENCOUNTER — Encounter: Payer: Self-pay | Admitting: Internal Medicine

## 2018-09-09 ENCOUNTER — Other Ambulatory Visit: Payer: Self-pay

## 2018-09-09 MED ORDER — AMLODIPINE BESYLATE 10 MG PO TABS: 10.0000 mg | ORAL_TABLET | Freq: Every day | ORAL | 0 refills | Status: DC

## 2018-09-09 MED ORDER — SPIRONOLACTONE 25 MG PO TABS: 25.0000 mg | ORAL_TABLET | Freq: Every day | ORAL | 0 refills | Status: DC

## 2018-09-09 MED ORDER — TAMSULOSIN HCL 0.4 MG PO CAPS: ORAL_CAPSULE | ORAL | 3 refills | Status: DC

## 2018-09-09 NOTE — Addendum Note (Signed)
Addended by: Raford Pitcher R on: 09/09/2018 04:31 PM   Modules accepted: Orders

## 2018-09-22 ENCOUNTER — Encounter: Payer: Self-pay | Admitting: Internal Medicine

## 2018-09-22 ENCOUNTER — Encounter: Payer: Self-pay | Admitting: Sports Medicine

## 2018-09-23 NOTE — Telephone Encounter (Signed)
Spoke with pt. He advised that knee and hip pain flared up yesterday. He requested to start on long term low dose Prednisone but Dr. Sharlet Salina did not agree. He reports that they had previously discussed having DEXA and being on long term course of Prednisone. He discussed possibly wanting to change to Dr. [name] as PCP. I advised that Dr. [name] is not taking patients as a PCP, only for sports medicine. He said that if he feels that he cannot get the support to help him get the relief he needs from his knee and hip pain, he will consider transferring care to a PCP at the [facility] office. I advised that if he should decide to do so, I will get him in contact with someone that can help provide more information about the transfer of care. Pt verbalized understanding. No further action required at this time.

## 2018-09-29 ENCOUNTER — Encounter: Payer: Self-pay | Admitting: Internal Medicine

## 2018-09-29 DIAGNOSIS — R300 Dysuria: Secondary | ICD-10-CM

## 2018-09-29 MED ORDER — MELOXICAM 15 MG PO TABS: 15.0000 mg | ORAL_TABLET | Freq: Every day | ORAL | 3 refills | Status: DC

## 2018-10-01 ENCOUNTER — Other Ambulatory Visit: Payer: Self-pay | Admitting: Internal Medicine

## 2018-10-01 NOTE — Addendum Note (Signed)
Addended by: Pricilla Holm A on: 10/01/2018 08:52 AM   Modules accepted: Orders

## 2018-10-03 ENCOUNTER — Other Ambulatory Visit (INDEPENDENT_AMBULATORY_CARE_PROVIDER_SITE_OTHER): Payer: Medicare Other

## 2018-10-03 ENCOUNTER — Other Ambulatory Visit: Payer: Self-pay | Admitting: Internal Medicine

## 2018-10-03 DIAGNOSIS — R300 Dysuria: Secondary | ICD-10-CM

## 2018-10-03 LAB — URINALYSIS, ROUTINE W REFLEX MICROSCOPIC
Bilirubin Urine: NEGATIVE
Ketones, ur: NEGATIVE
Nitrite: NEGATIVE
Specific Gravity, Urine: 1.02 (ref 1.000–1.030)
Total Protein, Urine: NEGATIVE
Urine Glucose: NEGATIVE
Urobilinogen, UA: 0.2 (ref 0.0–1.0)
pH: 7.5 (ref 5.0–8.0)

## 2018-10-03 MED ORDER — CIPROFLOXACIN HCL 500 MG PO TABS: 500.0000 mg | ORAL_TABLET | Freq: Two times a day (BID) | ORAL | 0 refills | Status: DC

## 2018-10-15 ENCOUNTER — Encounter: Payer: Self-pay | Admitting: Internal Medicine

## 2018-10-17 ENCOUNTER — Ambulatory Visit (INDEPENDENT_AMBULATORY_CARE_PROVIDER_SITE_OTHER): Payer: Medicare Other | Admitting: Internal Medicine

## 2018-10-17 ENCOUNTER — Encounter: Payer: Self-pay | Admitting: Internal Medicine

## 2018-10-17 DIAGNOSIS — R399 Unspecified symptoms and signs involving the genitourinary system: Secondary | ICD-10-CM | POA: Diagnosis not present

## 2018-10-17 LAB — POCT URINALYSIS DIPSTICK
Appearance: NORMAL
Bilirubin, UA: NEGATIVE
Blood, UA: NEGATIVE
Glucose, UA: NEGATIVE
Ketones, UA: NEGATIVE
Leukocytes, UA: NEGATIVE
Nitrite, UA: NEGATIVE
Odor: NORMAL
Protein, UA: NEGATIVE
Spec Grav, UA: 1.02
Urobilinogen, UA: 0.2 U/dL
pH, UA: 6

## 2018-10-17 MED ORDER — CIPROFLOXACIN HCL 500 MG PO TABS: 500.0000 mg | ORAL_TABLET | Freq: Two times a day (BID) | ORAL | 0 refills | Status: AC

## 2018-10-17 NOTE — Progress Notes (Signed)
   Subjective:   Patient ID: [name], male    DOB: 1932/03/05, 85 y.o.   MRN: [id number]  HPI The patient is an 85 YO man coming in for uti symptoms. Started about 2-3 weeks initially and then he gave sample at the lab. He then got cipro for 5 days which helped a good bit. He then started having symptoms again about 1-2 days after stopping. Main symptoms are: pressure when urinating and pain with urination. Denies fevers or chills. Some mild nausea. Overall it is stable since onset. Has tried nothing.   Review of Systems  Constitutional: Negative.   HENT: Negative.   Eyes: Negative.   Respiratory: Negative for cough, chest tightness and shortness of breath.   Cardiovascular: Negative for chest pain, palpitations and leg swelling.  Gastrointestinal: Negative for abdominal distention, abdominal pain, constipation, diarrhea, nausea and vomiting.  Genitourinary: Positive for dysuria, frequency and urgency. Negative for discharge, genital sores, hematuria, penile swelling, scrotal swelling and testicular pain.  Musculoskeletal: Negative.   Skin: Negative.   Neurological: Negative.   Psychiatric/Behavioral: Negative.     Objective:  Physical Exam Constitutional:      Appearance: He is well-developed.  HENT:     Head: Normocephalic and atraumatic.  Neck:     Musculoskeletal: Normal range of motion.  Cardiovascular:     Rate and Rhythm: Normal rate and regular rhythm.  Pulmonary:     Effort: Pulmonary effort is normal. No respiratory distress.     Breath sounds: Normal breath sounds. No wheezing or rales.  Abdominal:     General: Bowel sounds are normal. There is no distension.     Palpations: Abdomen is soft.     Tenderness: There is no abdominal tenderness. There is no rebound.  Skin:    General: Skin is warm and dry.  Neurological:     Mental Status: He is alert and oriented to person, place, and time.     Coordination: Coordination normal.     Vitals:   10/17/18 1515    BP: 122/60  Pulse: 71  Temp: 98.1 F (36.7 C)  TempSrc: Oral  SpO2: 96%  Weight: 204 lb (92.5 kg)  Height: 6' (1.829 m)    Assessment & Plan:

## 2018-10-17 NOTE — Patient Instructions (Signed)
We will go ahead and treat you with the ciprofloxacin 1 pill twice a day for 2 weeks.

## 2018-10-17 NOTE — Assessment & Plan Note (Signed)
Checking POC U/A in the office. Will treat for prostatitis with 2 week ciprofloxacin course.

## 2018-10-24 ENCOUNTER — Encounter: Payer: Self-pay | Admitting: Sports Medicine

## 2018-10-24 ENCOUNTER — Ambulatory Visit: Payer: Self-pay

## 2018-10-24 ENCOUNTER — Other Ambulatory Visit: Payer: Self-pay

## 2018-10-24 ENCOUNTER — Ambulatory Visit (INDEPENDENT_AMBULATORY_CARE_PROVIDER_SITE_OTHER): Payer: Medicare Other | Admitting: Sports Medicine

## 2018-10-24 VITALS — BP 100/54 | HR 69 | Ht 72.0 in | Wt 204.4 lb

## 2018-10-24 DIAGNOSIS — M1712 Unilateral primary osteoarthritis, left knee: Secondary | ICD-10-CM

## 2018-10-24 NOTE — Procedures (Signed)
PROCEDURE NOTE:  Ultrasound Guided: Injection: Left knee, Zilretta  Images were obtained and interpreted by myself, Teresa Coombs, DO  Images have been saved and stored to PACS system. Images obtained on: GE S7 Ultrasound machine    ULTRASOUND FINDINGS:  Moderate degenerative change.  Small supraphysiologic effusion.  Generalized synovitis.  DESCRIPTION OF PROCEDURE:  The patient's clinical condition is marked by substantial pain and/or significant functional disability. Other conservative therapy has not provided relief, is contraindicated, or not appropriate. There is a reasonable likelihood that injection will significantly improve the patient's pain and/or functional impairment.   After discussing the risks, benefits and expected outcomes of the injection and all questions were reviewed and answered, the patient wished to undergo the above named procedure.  Verbal consent was obtained.  The ultrasound was used to identify the target structure and adjacent neurovascular structures. The skin was then prepped in sterile fashion and the target structure was injected under direct visualization using sterile technique as below:  Single injection performed as below: PREP: Alcohol and Ethel Chloride APPROACH:superiolateral, single injection, 21g 2 in. INJECTATE: 5cc Zilretta (32mg /50mL Sustained Release Triamcinolone) ASPIRATE: None DRESSING: Band-Aid  Post procedural instructions including recommending icing and warning signs for infection were reviewed.    This procedure was well tolerated and there were no complications.   IMPRESSION: Succesful Ultrasound Guided: Injection

## 2018-10-24 NOTE — Patient Instructions (Addendum)

## 2018-10-24 NOTE — Progress Notes (Signed)
[name] , [place] at [facility] - 85 y.o. male MRN [id number]  Date of birth: [birth year]  Visit Date: October 27, 2018  PCP: Hoyt Koch, MD   Referred by: Hoyt Koch, *  SUBJECTIVE:  Chief Complaint  Patient presents with  . Left Knee - Follow-up    XR L knee 05/05/2014. Corticosteroid injection 08/04/2018. Past treatments - Baclofen, oral Diclofenac. Currently taking Oxycodone.     HPI: Patient is here with worsening left knee pain.  Pain is described as sharp medial pain.  Has improved with injection in the past.  However this is only typically lasted 3 to 4 weeks at the most.  He is repeating right knee injection today.  REVIEW OF SYSTEMS: No significant nighttime awakenings due to this issue. Denies fevers, chills, recent weight gain or weight loss.  No night sweats.  Pt denies any change in bowel or bladder habits, muscle weakness, numbness or falls associated with this pain.  HISTORY:  Prior history reviewed and updated per electronic medical record.  Patient Active Problem List   Diagnosis Date Noted  . UTI symptoms 10/17/2018  . Acute diastolic CHF (congestive heart failure) ([place]) 08/10/2018  . Leg wound, left 03/19/2018  . Right arm pain 11/11/2017  . Primary osteoarthritis of right foot 08/21/2017  . Syncope 02/27/2017  . Arthritis of right ankle 12/18/2016    Injected 12/18/2016   . Carpal tunnel syndrome, bilateral 09/04/2016  . Leg swelling 08/16/2016  . Hand weakness 07/12/2016  . Degenerative arthritis of left knee 06/28/2016    Injected 06/28/2016 Repeat injection 10/16/2016 Viscous supplementation given and routine left knee. 01/11/17 [name] 80 Depo -consider colchicine for chondrocalcinosis component 05/21/17 - Repeat Depo 07/29/2017 - repeat Depo,   . Iron deficiency anemia 03/16/2016  . Arthritis of left acromioclavicular joint 01/03/2016   Injected 01/03/2016   . Rotator cuff disorder 01/03/2016    MRI right shoulder 05/25/2016: Complete tear of the supraspinatus, subscap and intra-articular portion long head of biceps.  Large glenohumeral joint effusion.  Seen at [facility] and recommended avoiding surgical intervention at that time.  He does have moderate degree of osteoarthritis as well and could be a candidate for a total shoulder is persistent lack of improvement or significant currently being managed well with injections. Bilateral injections given 03/16/2016 Right sided injection given 04/30/2016 Right-sided injection on 10/28/17    . Osteoarthritis of left knee 04/30/2015    Injection 07/29/2017   . Baker's cyst of knee 04/30/2015    Aspirated inserted comer 17 2016   . Esophageal stricture 04/26/2015  . Benign prostatic hyperplasia 02/21/2015  . Constipation 02/21/2015  . Myalgia and myositis 11/18/2014  . Polyarticular arthritis 07/19/2013  . Chronic radicular low back pain 12/15/2012    Lumbar surgery March 24.2014 and cervical fusion 09/2012   . History of gout 02/20/2010    Qualifier: Diagnosis of  By: Arnoldo Morale MD, Balinda Quails    . ERECTILE DYSFUNCTION, ORGANIC 02/20/2010    Qualifier: Diagnosis of  By: Arnoldo Morale MD, Balinda Quails    . Hyperlipidemia 10/18/2008    Qualifier: Diagnosis of  By: Arnoldo Morale MD, Balinda Quails    . CAD 08/18/2007    Qualifier: Diagnosis of  By: Arnoldo Morale MD, [place] hypertension 05/09/2007    Qualifier: Diagnosis of  By: Arnoldo Morale MD, [place] RHINITIS 03/27/2007  Qualifier: Diagnosis of  By: Tiney Rouge CMA, Ellison Hughs      Social History   Occupational History  . Occupation: Retired  Tobacco Use  . Smoking status: Former Smoker    Packs/day: 1.00    Years: 33.00    Pack years: 33.00    Last attempt to quit: 09/29/1981    Years since quitting: 37.1  . Smokeless tobacco: Never Used  Substance and Sexual Activity  . Alcohol use: Yes    Alcohol/week: 3.0  standard drinks    Types: 3 Standard drinks or equivalent per week    Comment: Social  . Drug use: No  . Sexual activity: Yes    Partners: Female    Comment: Married   Social History   Social History Narrative   Lives at home w/ his wife   Left-handed   Caffeine: 1 cup coffee daily   1 son alive, 2 sons deceased   Retired Building control surveyor, Furniture conservator/restorer.    Education: GED     OBJECTIVE:  VS:  HT:6' (182.9 cm)   WT:204 lb 6.4 oz (92.7 kg)  BMI:27.72    BP:(!) 100/54  HR:69bpm  TEMP: ( )  RESP:96 %   PHYSICAL EXAM: Adult male. No acute distress.  Alert and appropriate. Left knee is overall well aligned with generalized osteoarthritic bossing.  He has a small effusion but this is minimal.  Ligamentously stable.  Marked degenerative changes and pain with medial and lateral joint line palpation.   ASSESSMENT:  1. Primary osteoarthritis of left knee     PROCEDURES:  US Guided Injection per procedure note      PLAN:  Pertinent additional documentation may be included in corresponding procedure notes, imaging studies, problem based documentation and patient instructions.  No problem-specific Assessment & Plan notes found for this encounter.   Long-acting Zilretta injection performed today.  Anticipate at least 12 weeks of improvement with this.  Can consider repeat injection at follow-up.   Activity modifications and the importance of avoiding exacerbating activities (limiting pain to no more than a 4 / 10 during or following activity) recommended and discussed.   Discussed red flag symptoms that warrant earlier emergent evaluation and patient voices understanding.    No orders of the defined types were placed in this encounter.  Lab Orders  No laboratory test(s) ordered today    Imaging Orders     [place] [facility] POCT ULTRASOUND Referral Orders  No referral(s) requested today    Return in about 13 weeks (around 01/23/2019) for left knee pain.          Gerda Diss, [facility] Physician

## 2018-10-27 ENCOUNTER — Encounter: Payer: Self-pay | Admitting: Sports Medicine

## 2018-11-03 ENCOUNTER — Encounter: Payer: Self-pay | Admitting: Internal Medicine

## 2018-11-03 MED ORDER — FOSFOMYCIN TROMETHAMINE 3 G PO PACK: 3.0000 g | PACK | Freq: Once | ORAL | 0 refills | Status: AC

## 2018-11-03 NOTE — Addendum Note (Signed)
Addended by: Pricilla Holm A on: 11/03/2018 03:57 PM   Modules accepted: Orders

## 2018-11-05 ENCOUNTER — Encounter: Payer: Self-pay | Admitting: Sports Medicine

## 2018-11-07 ENCOUNTER — Encounter: Payer: Self-pay | Admitting: Internal Medicine

## 2018-11-10 MED ORDER — CIPROFLOXACIN HCL 500 MG PO TABS: 500.0000 mg | ORAL_TABLET | Freq: Two times a day (BID) | ORAL | 0 refills | Status: DC

## 2018-11-11 ENCOUNTER — Encounter: Payer: Self-pay | Admitting: Internal Medicine

## 2018-11-26 ENCOUNTER — Encounter: Payer: Self-pay | Admitting: Internal Medicine

## 2018-11-26 DIAGNOSIS — R309 Painful micturition, unspecified: Secondary | ICD-10-CM

## 2018-11-27 ENCOUNTER — Encounter: Payer: Self-pay | Admitting: Internal Medicine

## 2018-12-02 ENCOUNTER — Encounter: Payer: Self-pay | Admitting: Internal Medicine

## 2018-12-03 ENCOUNTER — Encounter: Payer: Self-pay | Admitting: Internal Medicine

## 2018-12-03 DIAGNOSIS — R300 Dysuria: Secondary | ICD-10-CM | POA: Diagnosis not present

## 2018-12-03 DIAGNOSIS — N480 Leukoplakia of penis: Secondary | ICD-10-CM | POA: Diagnosis not present

## 2018-12-03 DIAGNOSIS — N35911 Unspecified urethral stricture, male, meatal: Secondary | ICD-10-CM | POA: Diagnosis not present

## 2018-12-18 ENCOUNTER — Ambulatory Visit: Payer: Medicare Other | Admitting: Internal Medicine

## 2018-12-26 ENCOUNTER — Encounter: Payer: Self-pay | Admitting: Sports Medicine

## 2018-12-27 ENCOUNTER — Encounter: Payer: Self-pay | Admitting: Internal Medicine

## 2018-12-28 NOTE — Progress Notes (Signed)
[name] [address], [place] [address] Phone: [phone] Subjective:   I, [name], am serving as a scribe for Dr. Hulan Saas.  I'm seeing this patient by the request  of:    CC: Foot pain follow-up  GGY:IRSWNIOEVO   12/18/2016 Injecting Monovisc 12/18/2016. Responded well to the conservative treatment at this moment. Has failed all other conservative segment. We discussed icing regimen, home exercises and bracing. Patient will try this and follow-up with me again in 4 weeks  Given injection today. Tolerated the procedure well. Discussed with patient to monitor her certain side effects. We discussed possible bracing which patient declined. Decline formal physical therapy. Given some mild range of motion home exercises. Hopefully that this will do well. Follow-up again in 4 weeks   12/21/2018 [name] is a 85 y.o. male coming in with complaint of right foot pain. Previously seen in 2018. Most recently has been seen for osteoarthritis of right foot by Dr. [name] in 07/2018. Patient states Bilateral knee pain. States it feels like inflammation. Usually takes prednisone. Doesn't believe the injections help more than prednisone. Acute. States that most times his knees and hips are most painful.  Patient has had work-up of symptoms his foot previously that shows severe osteoarthritic changes of the talonavicular joint.  States that it is severe.  Unable to walk long distances secondary to the pain.  Seen with patient's knee.  Right-sided knee replacement noted.     Past Medical History:  Diagnosis Date  . ACTINIC KERATOSIS, HEAD 03/23/2008  . ALLERGIC RHINITIS 03/27/2007  . Arthritis   . BENIGN PROSTATIC HYPERTROPHY, WITH URINARY OBSTRUCTION 10/16/2007  . CAD 08/18/2007   "patient denies any issues with heart, does not see cardiologist"  . Carpal tunnel syndrome, bilateral 09/04/2016  . CARPAL TUNNEL SYNDROME, LEFT 04/13/2008  . Chronic back  pain   . Chronic neck pain   . DVT (deep venous thrombosis) ([place])   . ERECTILE DYSFUNCTION, ORGANIC 02/20/2010  . Gout, unspecified 02/20/2010  . Headache(784.0)    migraines hx of  . History of melanoma   . HYPERLIPIDEMIA, WITH HIGH HDL 10/18/2008  . HYPERTENSION 05/09/2007   sees Dr. Benay Pillow  . KNEE PAIN 10/10/2009  . LOW BACK PAIN 03/27/2007  . NEOPLASM, MALIGNANT, SKIN, TRUNK 05/09/2007  . Patellar tendinitis 10/10/2009  . SKIN CANCER, HX OF 03/27/2007  . UNS ADVRS EFF UNS RX MEDICINAL&BIOLOGICAL SBSTNC 08/18/2007  . Urinary urgency    Past Surgical History:  Procedure Laterality Date  . APPENDECTOMY  1969  . BACK SURGERY    . BLEPHAROPLASTY    . CARPAL TUNNEL RELEASE    . CHOLECYSTECTOMY    . ESOPHAGOGASTRODUODENOSCOPY (EGD) WITH PROPOFOL N/A 04/26/2015   Procedure: ESOPHAGOGASTRODUODENOSCOPY (EGD) WITH PROPOFOL;  Surgeon: Inda Castle, MD;  Location: [facility];  Service: Endoscopy;  Laterality: N/A;  . EYE SURGERY     bilateral cataracts removed and vitrectomies  . JOINT REPLACEMENT Right    knee  . KNEE ARTHROSCOPY    . LAMINECTOMY    . LATERAL FUSION LUMBAR SPINE, TRANSVERSE    . LUMBAR LAMINECTOMY/DECOMPRESSION MICRODISCECTOMY Left 11/03/2012   Procedure: LUMBAR LAMINECTOMY/DECOMPRESSION MICRODISCECTOMY 1 LEVEL;  Surgeon: Hosie Spangle, MD;  Location: [facility];  Service: Neurosurgery;  Laterality: Left;  LEFT L5S1 laminotomy foraminotomy and possible microdiskectomy  . MELANOMA EXCISION    . NASAL SINUS SURGERY    . POSTERIOR CERVICAL FUSION/FORAMINOTOMY Right 10/01/2012   Procedure:  POSTERIOR CERVICAL FUSION/FORAMINOTOMY LEVEL 1;  Surgeon: Hosie Spangle, MD;  Location: [facility];  Service: Neurosurgery;  Laterality: Right;  Right Cervical seven thoracic one Cervical laminectomy/foraminotomy with posterior cervical arthrodesis   . POSTERIOR LUMBAR FUSION N/A 05/23/2013   Procedure: exploration of lumbar wound, explantation of left interbody implant.;   Surgeon: Hosie Spangle, MD;  Location: [facility];  Service: Neurosurgery;  Laterality: N/A;  . RECTAL SURGERY     Fissure  . VITRECTOMY     right and left 2013   Social History   Socioeconomic History  . Marital status: Married    Spouse name: Not on file  . Number of children: 3  . Years of education: 79  . Highest education level: Not on file  Occupational History  . Occupation: Retired  Scientific laboratory technician  . Financial resource strain: Not on file  . Food insecurity:    Worry: Not on file    Inability: Not on file  . Transportation needs:    Medical: Not on file    Non-medical: Not on file  Tobacco Use  . Smoking status: Former Smoker    Packs/day: 1.00    Years: 33.00    Pack years: 33.00    Last attempt to quit: 09/29/1981    Years since quitting: 37.2  . Smokeless tobacco: Never Used  Substance and Sexual Activity  . Alcohol use: Yes    Alcohol/week: 3.0 standard drinks    Types: 3 Standard drinks or equivalent per week    Comment: Social  . Drug use: No  . Sexual activity: Yes    Partners: Female    Comment: Married  Lifestyle  . Physical activity:    Days per week: Not on file    Minutes per session: Not on file  . Stress: Not on file  Relationships  . Social connections:    Talks on phone: Not on file    Gets together: Not on file    Attends religious service: Not on file    Active member of club or organization: Not on file    Attends meetings of clubs or organizations: Not on file    Relationship status: Not on file  Other Topics Concern  . Not on file  Social History Narrative   Lives at home w/ his wife   Left-handed   Caffeine: 1 cup coffee daily   1 son alive, 2 sons deceased   Retired Building control surveyor, Furniture conservator/restorer.    Education: GED   Allergies  Allergen Reactions  . Amlodipine     Ankle swelling  . Gabapentin     Lower extremity edema  . Lyrica [Pregabalin] Swelling   Family History  Problem Relation Age of Onset  . Dementia Mother   . Anemia  Father   . Heart disease Father   . Stroke Sister        ICH  . Lung disease Sister     Current Outpatient Medications (Endocrine & Metabolic):  .  predniSONE (DELTASONE) 20 MG tablet, Take 2 tablets (40 mg total) by mouth daily with breakfast.  Current Outpatient Medications (Cardiovascular):  .  amLODipine (NORVASC) 10 MG tablet, TAKE 1 TABLET BY MOUTH EVERY DAY .  carvedilol (COREG) 25 MG tablet, TAKE 1 TABLET BY MOUTH 2 TIMES DAILY WITH A MEAL (Patient taking differently: Take 25 mg by mouth 2 (two) times daily with a meal. ) .  furosemide (LASIX) 20 MG tablet, Take 1 or 2 tablets a day as  needed for shortness of breath or swelling in the legs / feet. .  hydrALAZINE (APRESOLINE) 50 MG tablet, Take 1 tablet (50 mg total) by mouth every 8 (eight) hours. Marland Kitchen  spironolactone (ALDACTONE) 25 MG tablet, TAKE 1 TABLET BY MOUTH EVERY DAY  Current Outpatient Medications (Respiratory):  .  fluticasone (FLONASE) 50 MCG/ACT nasal spray, Place 1 spray into both nostrils every evening.   Current Outpatient Medications (Analgesics):  .  meloxicam (MOBIC) 15 MG tablet, Take 1 tablet (15 mg total) by mouth daily. .  Oxycodone HCl 10 MG TABS, Take 10 mg by mouth 3 (three) times daily as needed (pain).   Current Outpatient Medications (Hematological):  Marland Kitchen  [facility] VITAMIN B12 1000 MCG tablet, TAKE 1 TABLET (1,000 MCG TOTAL) BY MOUTH DAILY. (Patient taking differently: Take 1,000 mcg by mouth daily as needed (energy). )  Current Outpatient Medications (Other):  .  ciprofloxacin (CIPRO) 500 MG tablet, Take 1 tablet (500 mg total) by mouth 2 (two) times daily. .  finasteride (PROSCAR) 5 MG tablet, TAKE 1 TABLET BY MOUTH EVERY DAY .  magnesium hydroxide (MILK OF MAGNESIA) 800 MG/5ML suspension, Take 15 mLs by mouth daily as needed (leg cramps).  .  tamsulosin (FLOMAX) 0.4 MG CAPS capsule, TAKE 1 CAPSULE (0.4 MG TOTAL) BY MOUTH DAILY. Marland Kitchen  triamcinolone cream (KENALOG) 0.5 %, USE AS DIRECTED    Past medical  history, social, surgical and family history all reviewed in electronic medical record.  No pertanent information unless stated regarding to the chief complaint.   Review of Systems:  No headache, visual changes, nausea, vomiting, diarrhea, constipation, dizziness, abdominal pain, skin rash, fevers, chills, night sweats, weight loss, swollen lymph nodes,  chest pain, shortness of breath, mood changes.  Positive muscle aches and body aches  Objective  Blood pressure 140/70, pulse 82, height 6' (1.829 m), weight 197 lb (89.4 kg), SpO2 96 %.    General: No apparent distress alert and oriented x3 mood and affect normal, dressed appropriately.  Patient is extremely standoffish.  Patient is attempting to be argumentative HEENT: Pupils equal, extraocular movements intact  Respiratory: Patient's speak in full sentences and does not appear short of breath  Cardiovascular: 2+ lower extremity edema, tender, no erythema  Skin: Warm dry intact with no signs of infection or rash on extremities or on axial skeleton.  Abdomen: Soft nontender  Neuro: Cranial nerves II through XII are intact, neurovascularly intact in all extremities with 2+ DTRs and 2+ pulses.  Lymph: No lymphadenopathy of posterior or anterior cervical chain or axillae bilaterally.  Gait severely antalgic MSK: Arthritic changes of multiple joints  Right ankle exam shows severe tenderness over the medial aspect of the talonavicular bone.  Patient has a positive Tinel's over the tarsal tunnel.  Patient has significant limited range of motion in all planes.  Patient does have mild crepitus noted.  Patient does have pitting edema of the soft tissue of the lower extremities all the way to knee level.  Right knee shows replacement with 95 degrees of flexion.  No significant instability.  Left knee shows significant arthritic changes.  Tender to palpation even to light palpation.  Procedure: Real-time Ultrasound Guided Injection of right  talonavicular joint Device: GE Logiq Q7 Ultrasound guided injection is preferred based studies that show increased duration, increased effect, greater accuracy, decreased procedural pain, increased response rate, and decreased cost with ultrasound guided versus blind injection.  Verbal informed consent obtained.  Time-out conducted.  Noted no overlying erythema, induration, or  other signs of local infection.  Skin prepped in a sterile fashion.  Local anesthesia: Topical Ethyl chloride.  With sterile technique and under real time ultrasound guidance: With a 25-gauge half inch needle injected into the talonavicular joint with a total of 0.5 cc of 0.5% Marcaine and 0.5 cc of Kenalog 40 mg/mL. Completed without difficulty  Pain immediately resolved suggesting accurate placement of the medication.  Advised to call if fevers/chills, erythema, induration, drainage, or persistent bleeding.  Images permanently stored and available for review in the ultrasound unit.  Impression: Technically successful ultrasound guided injection.    Impression and Recommendations:     This case required medical decision making of moderate complexity. The above documentation has been reviewed and is accurate and complete Lyndal Pulley, DO       Note: This dictation was prepared with Dragon dictation along with smaller phrase technology. Any transcriptional errors that result from this process are unintentional.

## 2018-12-29 ENCOUNTER — Other Ambulatory Visit: Payer: Self-pay

## 2018-12-29 ENCOUNTER — Encounter: Payer: Self-pay | Admitting: Family Medicine

## 2018-12-29 ENCOUNTER — Ambulatory Visit: Payer: Self-pay

## 2018-12-29 ENCOUNTER — Other Ambulatory Visit: Payer: Self-pay | Admitting: Internal Medicine

## 2018-12-29 ENCOUNTER — Ambulatory Visit (INDEPENDENT_AMBULATORY_CARE_PROVIDER_SITE_OTHER): Payer: Medicare Other | Admitting: Family Medicine

## 2018-12-29 VITALS — BP 140/70 | HR 82 | Ht 72.0 in | Wt 197.0 lb

## 2018-12-29 DIAGNOSIS — M19071 Primary osteoarthritis, right ankle and foot: Secondary | ICD-10-CM | POA: Diagnosis not present

## 2018-12-29 DIAGNOSIS — M79671 Pain in right foot: Secondary | ICD-10-CM | POA: Diagnosis not present

## 2018-12-29 DIAGNOSIS — M1712 Unilateral primary osteoarthritis, left knee: Secondary | ICD-10-CM

## 2018-12-29 MED ORDER — PREDNISONE 20 MG PO TABS: 40.0000 mg | ORAL_TABLET | Freq: Every day | ORAL | 0 refills | Status: DC

## 2018-12-29 NOTE — Patient Instructions (Signed)
Good to see you  Injected the ankle and I hope it helps Try to look for rocker bottom foot.  Prednsione daily for 7 days but please avoid going out of the house much.  See me again in 2 months

## 2018-12-29 NOTE — Assessment & Plan Note (Signed)
Continue intermittent changes.  We will consider the possibility of other injections in the long run.  Patient did not want another injection today even though he considered the knee significantly painful.

## 2018-12-29 NOTE — Assessment & Plan Note (Signed)
Patient previously had had some results with cimetidine talonavicular joint and the tarsal tunnel injected.  Attempted another injection today.  Tolerated the procedure well.  Discussed different shoes that would be better for him and likely will respond to conservative therapy.  Discussed icing regimen and home exercises.  Follow-up again 6 weeks.  Of note the patient was having significant pain of multiple joints.  Was adamant he would like being prednisone.  Discussed with him about the longevity of taking this medication and the potential side effects.  We also discussed during the corona outbreak there is a possibility of this causing him more likely to have a severe infection if it does occur.  Patient continued to warrant the medication.  Patient was counseled on any potential side effects of the long-term use as well.  Patient given him 7-day prescription.

## 2018-12-30 ENCOUNTER — Encounter: Payer: Self-pay | Admitting: Family Medicine

## 2019-01-11 ENCOUNTER — Other Ambulatory Visit: Payer: Self-pay | Admitting: Internal Medicine

## 2019-01-14 DIAGNOSIS — M5136 Other intervertebral disc degeneration, lumbar region: Secondary | ICD-10-CM | POA: Diagnosis not present

## 2019-01-14 DIAGNOSIS — M4726 Other spondylosis with radiculopathy, lumbar region: Secondary | ICD-10-CM | POA: Diagnosis not present

## 2019-01-14 DIAGNOSIS — M5416 Radiculopathy, lumbar region: Secondary | ICD-10-CM | POA: Diagnosis not present

## 2019-01-14 DIAGNOSIS — M48062 Spinal stenosis, lumbar region with neurogenic claudication: Secondary | ICD-10-CM | POA: Diagnosis not present

## 2019-01-14 DIAGNOSIS — Z981 Arthrodesis status: Secondary | ICD-10-CM | POA: Diagnosis not present

## 2019-01-16 ENCOUNTER — Telehealth: Payer: Self-pay | Admitting: Internal Medicine

## 2019-01-16 ENCOUNTER — Encounter: Payer: Self-pay | Admitting: Internal Medicine

## 2019-01-16 MED ORDER — LIDOCAINE 5 % EX PTCH: 1.0000 | MEDICATED_PATCH | CUTANEOUS | 0 refills | Status: DC

## 2019-01-16 NOTE — Telephone Encounter (Signed)
Patient responded through mychart.

## 2019-01-16 NOTE — Telephone Encounter (Signed)
Copied from Reader [id number]. Topic: Quick Communication - Rx Refill/Question >> Jan 16, 2019  8:56 AM Reyne Dumas L wrote: Medication: predniSONE (DELTASONE) 20 MG tablet  Has the patient contacted their pharmacy? yes (Agent: If no, request that the patient contact the pharmacy for the refill.) (Agent: If yes, when and what did the pharmacy advise?)  Preferred Pharmacy (with phone number or street name): [id number] [address], [place]. [phone] (Phone) [phone] (Fax)  Agent: Please be advised that RX refills may take up to 3 business days. We ask that you follow-up with your pharmacy.

## 2019-01-16 NOTE — Telephone Encounter (Signed)
This is not a long term medication and cannot be refilled.

## 2019-01-19 ENCOUNTER — Telehealth: Payer: Self-pay | Admitting: Internal Medicine

## 2019-01-19 ENCOUNTER — Encounter: Payer: Self-pay | Admitting: Internal Medicine

## 2019-01-19 ENCOUNTER — Other Ambulatory Visit: Payer: Self-pay

## 2019-01-19 ENCOUNTER — Ambulatory Visit (INDEPENDENT_AMBULATORY_CARE_PROVIDER_SITE_OTHER): Payer: Medicare Other | Admitting: Internal Medicine

## 2019-01-19 DIAGNOSIS — R300 Dysuria: Secondary | ICD-10-CM | POA: Diagnosis not present

## 2019-01-19 DIAGNOSIS — M130 Polyarthritis, unspecified: Secondary | ICD-10-CM

## 2019-01-19 MED ORDER — PREDNISONE 10 MG PO TABS: 10.0000 mg | ORAL_TABLET | Freq: Every day | ORAL | 3 refills | Status: DC

## 2019-01-19 NOTE — Telephone Encounter (Signed)
Telephone call made

## 2019-01-19 NOTE — Progress Notes (Signed)
Virtual Visit via Audio only Note  I connected with [name] on 01/19/19 at 11:00 AM EDT by an audio enabled telemedicine application and verified that I am speaking with the correct person using two identifiers.  The patient and the provider were at separate locations throughout the entire encounter.   I discussed the limitations of evaluation and management by telemedicine and the availability of in person appointments. The patient expressed understanding and agreed to proceed.  History of Present Illness: The patient is a 85 y.o. man with visit for chronic knee pain and arthritis pain. Saw sports medicine about 2-3 weeks ago and given 1 week of prednisone. 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 He wants to be on prednisone all the time. Is taking meloxicam and tylenol and oxycodone now for this. Has also tried lidocaine patches and tylenol and other otc creams. Started years ago and is gradually worsening. Has had chronic low back problems including several complicated surgeries. Denies new injury or overuse. Denies new swelling in joints or redness in joints. Overall it is worsening. Has tried many other treatment options including injections which have not helped recently.   Observations/Objective: Voice strong, no coughing during visit, no dyspnea, A and O times 3  Assessment and Plan: See problem oriented charting  Follow Up Instructions: rx prednisone 10 mg daily and call back in 1 week to discuss benefit. Extensive discussion about the potential risk of weakening bone, fracture, paralysis, diabetes from long term usage of prednisone  Visit time 14 minutes: that time was spent in face to face counseling and coordination of care with the patient: counseled about as above  I discussed the assessment and treatment plan with the patient. The patient was provided an opportunity to ask questions and all were answered. The patient agreed with the plan and demonstrated an understanding of the  instructions.   The patient was advised to call back or seek an in-person evaluation if the symptoms worsen or if the condition fails to improve as anticipated.  Jordan Koch, MD

## 2019-01-19 NOTE — Assessment & Plan Note (Signed)
Suspect that this is coming from lower back problems and arthritis. He is insistent that prednisone is the only thing which has ever helped and described the potential harm from this including paralysis, fracture, infection, diabetes, death. He is willing to continue.

## 2019-01-19 NOTE — Telephone Encounter (Signed)
If patient is not willing to make appointment with Sports Med or orthopedics then he can do a phone visit with Dr. Sharlet Salina today

## 2019-01-19 NOTE — Telephone Encounter (Signed)
Pt would like to come into the office to discuss his knee inflammation and other things.  Unable to do virtual. Please advise

## 2019-01-19 NOTE — Telephone Encounter (Signed)
Noted thanks °

## 2019-01-19 NOTE — Telephone Encounter (Signed)
I think he would be better served seeing either sports medicine or orthopedics to help with pain and assess options for his knees.

## 2019-02-02 ENCOUNTER — Encounter: Payer: Self-pay | Admitting: Internal Medicine

## 2019-02-13 ENCOUNTER — Encounter: Payer: Self-pay | Admitting: Family Medicine

## 2019-02-17 DIAGNOSIS — L570 Actinic keratosis: Secondary | ICD-10-CM | POA: Diagnosis not present

## 2019-02-17 DIAGNOSIS — Z85828 Personal history of other malignant neoplasm of skin: Secondary | ICD-10-CM | POA: Diagnosis not present

## 2019-02-17 DIAGNOSIS — D225 Melanocytic nevi of trunk: Secondary | ICD-10-CM | POA: Diagnosis not present

## 2019-02-20 ENCOUNTER — Encounter: Payer: Self-pay | Admitting: Family Medicine

## 2019-02-24 ENCOUNTER — Encounter: Payer: Self-pay | Admitting: Family Medicine

## 2019-02-24 ENCOUNTER — Other Ambulatory Visit: Payer: Self-pay

## 2019-02-24 ENCOUNTER — Ambulatory Visit (INDEPENDENT_AMBULATORY_CARE_PROVIDER_SITE_OTHER): Payer: Medicare Other | Admitting: Family Medicine

## 2019-02-24 DIAGNOSIS — M1712 Unilateral primary osteoarthritis, left knee: Secondary | ICD-10-CM

## 2019-02-24 NOTE — Progress Notes (Signed)
[name] [address] [place], [place] [address] Phone: [phone] Subjective:   I [name] am serving as a Education administrator for Dr. Hulan Saas.  CC: Left knee pain  [id number]  [name] is a 85 y.o. male coming in with complaint of left knee pain. Has a right knee replacement.  Known severe osteoarthritic changes of the left knee.  Has failed all conservative therapy.  Has responded somewhat to steroid injections.  Replacement secondary to patient's wife and has to be the main caretaker.  Patient states some mild increasing instability recently as well as swelling.  Affecting daily activities and working him up at night     Past Medical History:  Diagnosis Date  . ACTINIC KERATOSIS, HEAD 03/23/2008  . ALLERGIC RHINITIS 03/27/2007  . Arthritis   . BENIGN PROSTATIC HYPERTROPHY, WITH URINARY OBSTRUCTION 10/16/2007  . CAD 08/18/2007   "patient denies any issues with heart, does not see cardiologist"  . Carpal tunnel syndrome, bilateral 09/04/2016  . CARPAL TUNNEL SYNDROME, LEFT 04/13/2008  . Chronic back pain   . Chronic neck pain   . DVT (deep venous thrombosis) ([place])   . ERECTILE DYSFUNCTION, ORGANIC 02/20/2010  . Gout, unspecified 02/20/2010  . Headache(784.0)    migraines hx of  . History of melanoma   . HYPERLIPIDEMIA, WITH HIGH HDL 10/18/2008  . HYPERTENSION 05/09/2007   sees Dr. Benay Pillow  . KNEE PAIN 10/10/2009  . LOW BACK PAIN 03/27/2007  . NEOPLASM, MALIGNANT, SKIN, TRUNK 05/09/2007  . Patellar tendinitis 10/10/2009  . SKIN CANCER, HX OF 03/27/2007  . UNS ADVRS EFF UNS RX MEDICINAL&BIOLOGICAL SBSTNC 08/18/2007  . Urinary urgency    Past Surgical History:  Procedure Laterality Date  . APPENDECTOMY  1969  . BACK SURGERY    . BLEPHAROPLASTY    . CARPAL TUNNEL RELEASE    . CHOLECYSTECTOMY    . ESOPHAGOGASTRODUODENOSCOPY (EGD) WITH PROPOFOL N/A 04/26/2015   Procedure: ESOPHAGOGASTRODUODENOSCOPY (EGD) WITH PROPOFOL;  Surgeon: Inda Castle, MD;  Location: [facility];  Service: Endoscopy;  Laterality: N/A;  . EYE SURGERY     bilateral cataracts removed and vitrectomies  . JOINT REPLACEMENT Right    knee  . KNEE ARTHROSCOPY    . LAMINECTOMY    . LATERAL FUSION LUMBAR SPINE, TRANSVERSE    . LUMBAR LAMINECTOMY/DECOMPRESSION MICRODISCECTOMY Left 11/03/2012   Procedure: LUMBAR LAMINECTOMY/DECOMPRESSION MICRODISCECTOMY 1 LEVEL;  Surgeon: Hosie Spangle, MD;  Location: [facility];  Service: Neurosurgery;  Laterality: Left;  LEFT L5S1 laminotomy foraminotomy and possible microdiskectomy  . MELANOMA EXCISION    . NASAL SINUS SURGERY    . POSTERIOR CERVICAL FUSION/FORAMINOTOMY Right 10/01/2012   Procedure: POSTERIOR CERVICAL FUSION/FORAMINOTOMY LEVEL 1;  Surgeon: Hosie Spangle, MD;  Location: [facility];  Service: Neurosurgery;  Laterality: Right;  Right Cervical seven thoracic one Cervical laminectomy/foraminotomy with posterior cervical arthrodesis   . POSTERIOR LUMBAR FUSION N/A 05/23/2013   Procedure: exploration of lumbar wound, explantation of left interbody implant.;  Surgeon: Hosie Spangle, MD;  Location: [facility];  Service: Neurosurgery;  Laterality: N/A;  . RECTAL SURGERY     Fissure  . VITRECTOMY     right and left 2013   Social History   Socioeconomic History  . Marital status: Married    Spouse name: Not on file  . Number of children: 3  . Years of education: 76  . Highest education level: Not on file  Occupational History  . Occupation:  Retired  Scientific laboratory technician  . Financial resource strain: Not on file  . Food insecurity    Worry: Not on file    Inability: Not on file  . Transportation needs    Medical: Not on file    Non-medical: Not on file  Tobacco Use  . Smoking status: Former Smoker    Packs/day: 1.00    Years: 33.00    Pack years: 33.00    Quit date: 09/29/1981    Years since quitting: 37.4  . Smokeless tobacco: Never Used  Substance and Sexual Activity  . Alcohol use: Yes     Alcohol/week: 3.0 standard drinks    Types: 3 Standard drinks or equivalent per week    Comment: Social  . Drug use: No  . Sexual activity: Yes    Partners: Female    Comment: Married  Lifestyle  . Physical activity    Days per week: Not on file    Minutes per session: Not on file  . Stress: Not on file  Relationships  . Social Herbalist on phone: Not on file    Gets together: Not on file    Attends religious service: Not on file    Active member of club or organization: Not on file    Attends meetings of clubs or organizations: Not on file    Relationship status: Not on file  Other Topics Concern  . Not on file  Social History Narrative   Lives at home w/ his wife   Left-handed   Caffeine: 1 cup coffee daily   1 son alive, 2 sons deceased   Retired Building control surveyor, Furniture conservator/restorer.    Education: GED   Allergies  Allergen Reactions  . Amlodipine     Ankle swelling  . Gabapentin     Lower extremity edema  . Lyrica [Pregabalin] Swelling   Family History  Problem Relation Age of Onset  . Dementia Mother   . Anemia Father   . Heart disease Father   . Stroke Sister        ICH  . Lung disease Sister     Current Outpatient Medications (Endocrine & Metabolic):  .  predniSONE (DELTASONE) 10 MG tablet, Take 1 tablet (10 mg total) by mouth daily with breakfast.  Current Outpatient Medications (Cardiovascular):  .  amLODipine (NORVASC) 10 MG tablet, TAKE 1 TABLET BY MOUTH EVERY DAY .  carvedilol (COREG) 25 MG tablet, TAKE 1 TABLET BY MOUTH 2 TIMES DAILY WITH A MEAL .  furosemide (LASIX) 20 MG tablet, Take 1 or 2 tablets a day as needed for shortness of breath or swelling in the legs / feet. .  hydrALAZINE (APRESOLINE) 50 MG tablet, Take 1 tablet (50 mg total) by mouth every 8 (eight) hours. Marland Kitchen  spironolactone (ALDACTONE) 25 MG tablet, TAKE 1 TABLET BY MOUTH EVERY DAY  Current Outpatient Medications (Respiratory):  .  fluticasone (FLONASE) 50 MCG/ACT nasal spray, Place 1  spray into both nostrils every evening.   Current Outpatient Medications (Analgesics):  Marland Kitchen  Oxycodone HCl 10 MG TABS, Take 10 mg by mouth 3 (three) times daily as needed (pain).   Current Outpatient Medications (Hematological):  Marland Kitchen  [facility] VITAMIN B12 1000 MCG tablet, TAKE 1 TABLET (1,000 MCG TOTAL) BY MOUTH DAILY. (Patient taking differently: Take 1,000 mcg by mouth daily as needed (energy). )  Current Outpatient Medications (Other):  .  finasteride (PROSCAR) 5 MG tablet, TAKE 1 TABLET BY MOUTH EVERY DAY .  magnesium hydroxide (MILK  OF MAGNESIA) 800 MG/5ML suspension, Take 15 mLs by mouth daily as needed (leg cramps).  .  tamsulosin (FLOMAX) 0.4 MG CAPS capsule, TAKE 1 CAPSULE (0.4 MG TOTAL) BY MOUTH DAILY. Marland Kitchen  triamcinolone cream (KENALOG) 0.5 %, USE AS DIRECTED    Past medical history, social, surgical and family history all reviewed in electronic medical record.  No pertanent information unless stated regarding to the chief complaint.   Review of Systems:  No headache, visual changes, nausea, vomiting, diarrhea, constipation, dizziness, abdominal pain, skin rash, fevers, chills, night sweats, weight loss, swollen lymph nodes, body aches, joint swelling, chest pain, shortness of breath, mood changes.  Positive muscle aches  Objective  Blood pressure 100/82, pulse 81, height 6' (1.829 m), weight 201 lb (91.2 kg), SpO2 95 %.    General: No apparent distress alert not completely oriented HEENT: Pupils equal, extraocular movements intact  Respiratory: Patient's speak in full sentences and does not appear short of breath  Cardiovascular: No lower extremity edema, non tender, no erythema  Skin: Warm dry intact with no signs of infection or rash on extremities or on axial skeleton.  Abdomen: Soft nontender  Neuro: Cranial nerves II through XII are intact, neurovascularly intact in all extremities with 2+ DTRs and 2+ pulses.  Lymph: No lymphadenopathy of posterior or anterior cervical chain or  axillae bilaterally.  Gait antalgic MSK:  tender with limited range of motion and good stability and symmetric strength and tone of shoulders, elbows, wrist, hip, and ankles bilaterally.  Knee: Left valgus deformity noted.  Abnormal thigh to calf ratio.  Tender to palpation over medial and PF joint line.  ROM full in flexion and extension and lower leg rotation. instability with valgus force.  painful patellar compression. Patellar glide with moderate crepitus. Patellar and quadriceps tendons unremarkable. Hamstring and quadriceps strength is normal. Contralateral knee shows replacement  After informed written and verbal consent, patient was seated on exam table. Left knee was prepped with alcohol swab and utilizing anterolateral approach, patient's left knee space was injected with 4:1  marcaine 0.5%: Kenalog 40mg /dL. Patient tolerated the procedure well without immediate complications.     Impression and Recommendations:     This case required medical decision making of moderate complexity. The above documentation has been reviewed and is accurate and complete Lyndal Pulley, DO       Note: This dictation was prepared with Dragon dictation along with smaller phrase technology. Any transcriptional errors that result from this process are unintentional.

## 2019-02-24 NOTE — Assessment & Plan Note (Signed)
Patient be more primary caregiver for discussed patient's supplementation.  Increase activity as tolerated.  Follow-up again in 3 weeks

## 2019-02-24 NOTE — Patient Instructions (Addendum)
Good to see you  Ice is your friend Consider the brace We will get you approval for visco See me again in 3 weeks

## 2019-02-25 ENCOUNTER — Encounter: Payer: Self-pay | Admitting: Family Medicine

## 2019-03-02 ENCOUNTER — Ambulatory Visit: Payer: Medicare Other | Admitting: Family Medicine

## 2019-03-09 ENCOUNTER — Encounter: Payer: Self-pay | Admitting: Family Medicine

## 2019-03-12 ENCOUNTER — Encounter: Payer: Self-pay | Admitting: Family Medicine

## 2019-03-17 ENCOUNTER — Ambulatory Visit: Payer: Medicare Other | Admitting: Family Medicine

## 2019-03-17 NOTE — Progress Notes (Deleted)
[name] [address] [place], [place] [address] Phone: [phone] Subjective:    I'm seeing this patient by the request  of:    CC: Left knee pain  WER:[id number]  MARCELLIUS MONTAGNA is a 85 y.o. male coming in with complaint of ***  Onset-  Location Duration-  Character- Aggravating factors- Reliving factors-  Therapies tried-  Severity-     Past Medical History:  Diagnosis Date  . ACTINIC KERATOSIS, HEAD 03/23/2008  . ALLERGIC RHINITIS 03/27/2007  . Arthritis   . BENIGN PROSTATIC HYPERTROPHY, WITH URINARY OBSTRUCTION 10/16/2007  . CAD 08/18/2007   "patient denies any issues with heart, does not see cardiologist"  . Carpal tunnel syndrome, bilateral 09/04/2016  . CARPAL TUNNEL SYNDROME, LEFT 04/13/2008  . Chronic back pain   . Chronic neck pain   . DVT (deep venous thrombosis) ([place])   . ERECTILE DYSFUNCTION, ORGANIC 02/20/2010  . Gout, unspecified 02/20/2010  . Headache(784.0)    migraines hx of  . History of melanoma   . HYPERLIPIDEMIA, WITH HIGH HDL 10/18/2008  . HYPERTENSION 05/09/2007   sees Dr. Benay Pillow  . KNEE PAIN 10/10/2009  . LOW BACK PAIN 03/27/2007  . NEOPLASM, MALIGNANT, SKIN, TRUNK 05/09/2007  . Patellar tendinitis 10/10/2009  . SKIN CANCER, HX OF 03/27/2007  . UNS ADVRS EFF UNS RX MEDICINAL&BIOLOGICAL SBSTNC 08/18/2007  . Urinary urgency    Past Surgical History:  Procedure Laterality Date  . APPENDECTOMY  1969  . BACK SURGERY    . BLEPHAROPLASTY    . CARPAL TUNNEL RELEASE    . CHOLECYSTECTOMY    . ESOPHAGOGASTRODUODENOSCOPY (EGD) WITH PROPOFOL N/A 04/26/2015   Procedure: ESOPHAGOGASTRODUODENOSCOPY (EGD) WITH PROPOFOL;  Surgeon: Inda Castle, MD;  Location: [facility];  Service: Endoscopy;  Laterality: N/A;  . EYE SURGERY     bilateral cataracts removed and vitrectomies  . JOINT REPLACEMENT Right    knee  . KNEE ARTHROSCOPY    . LAMINECTOMY    . LATERAL FUSION LUMBAR SPINE, TRANSVERSE    . LUMBAR  LAMINECTOMY/DECOMPRESSION MICRODISCECTOMY Left 11/03/2012   Procedure: LUMBAR LAMINECTOMY/DECOMPRESSION MICRODISCECTOMY 1 LEVEL;  Surgeon: Hosie Spangle, MD;  Location: [facility];  Service: Neurosurgery;  Laterality: Left;  LEFT L5S1 laminotomy foraminotomy and possible microdiskectomy  . MELANOMA EXCISION    . NASAL SINUS SURGERY    . POSTERIOR CERVICAL FUSION/FORAMINOTOMY Right 10/01/2012   Procedure: POSTERIOR CERVICAL FUSION/FORAMINOTOMY LEVEL 1;  Surgeon: Hosie Spangle, MD;  Location: [facility];  Service: Neurosurgery;  Laterality: Right;  Right Cervical seven thoracic one Cervical laminectomy/foraminotomy with posterior cervical arthrodesis   . POSTERIOR LUMBAR FUSION N/A 05/23/2013   Procedure: exploration of lumbar wound, explantation of left interbody implant.;  Surgeon: Hosie Spangle, MD;  Location: [place] [place];  Service: Neurosurgery;  Laterality: N/A;  . RECTAL SURGERY     Fissure  . VITRECTOMY     right and left 2013   Social History   Socioeconomic History  . Marital status: Married    Spouse name: Not on file  . Number of children: 3  . Years of education: 34  . Highest education level: Not on file  Occupational History  . Occupation: Retired  Scientific laboratory technician  . Financial resource strain: Not on file  . Food insecurity    Worry: Not on file    Inability: Not on file  . Transportation needs    Medical: Not on file    Non-medical: Not on file  Tobacco Use  . Smoking status: Former Smoker    Packs/day: 1.00    Years: 33.00    Pack years: 33.00    Quit date: 09/29/1981    Years since quitting: 37.4  . Smokeless tobacco: Never Used  Substance and Sexual Activity  . Alcohol use: Yes    Alcohol/week: 3.0 standard drinks    Types: 3 Standard drinks or equivalent per week    Comment: Social  . Drug use: No  . Sexual activity: Yes    Partners: Female    Comment: Married  Lifestyle  . Physical activity    Days per week: Not on file    Minutes per session:  Not on file  . Stress: Not on file  Relationships  . Social Herbalist on phone: Not on file    Gets together: Not on file    Attends religious service: Not on file    Active member of club or organization: Not on file    Attends meetings of clubs or organizations: Not on file    Relationship status: Not on file  Other Topics Concern  . Not on file  Social History Narrative   Lives at home w/ his wife   Left-handed   Caffeine: 1 cup coffee daily   1 son alive, 2 sons deceased   Retired Building control surveyor, Furniture conservator/restorer.    Education: GED   Allergies  Allergen Reactions  . Amlodipine     Ankle swelling  . Gabapentin     Lower extremity edema  . Lyrica [Pregabalin] Swelling   Family History  Problem Relation Age of Onset  . Dementia Mother   . Anemia Father   . Heart disease Father   . Stroke Sister        ICH  . Lung disease Sister     Current Outpatient Medications (Endocrine & Metabolic):  .  predniSONE (DELTASONE) 10 MG tablet, Take 1 tablet (10 mg total) by mouth daily with breakfast.  Current Outpatient Medications (Cardiovascular):  .  amLODipine (NORVASC) 10 MG tablet, TAKE 1 TABLET BY MOUTH EVERY DAY .  carvedilol (COREG) 25 MG tablet, TAKE 1 TABLET BY MOUTH 2 TIMES DAILY WITH A MEAL .  furosemide (LASIX) 20 MG tablet, Take 1 or 2 tablets a day as needed for shortness of breath or swelling in the legs / feet. .  hydrALAZINE (APRESOLINE) 50 MG tablet, Take 1 tablet (50 mg total) by mouth every 8 (eight) hours. Marland Kitchen  spironolactone (ALDACTONE) 25 MG tablet, TAKE 1 TABLET BY MOUTH EVERY DAY  Current Outpatient Medications (Respiratory):  .  fluticasone (FLONASE) 50 MCG/ACT nasal spray, Place 1 spray into both nostrils every evening.   Current Outpatient Medications (Analgesics):  Marland Kitchen  Oxycodone HCl 10 MG TABS, Take 10 mg by mouth 3 (three) times daily as needed (pain).   Current Outpatient Medications (Hematological):  Marland Kitchen  [employer] VITAMIN B12 1000 MCG tablet, TAKE 1 TABLET  (1,000 MCG TOTAL) BY MOUTH DAILY. (Patient taking differently: Take 1,000 mcg by mouth daily as needed (energy). )  Current Outpatient Medications (Other):  .  finasteride (PROSCAR) 5 MG tablet, TAKE 1 TABLET BY MOUTH EVERY DAY .  magnesium hydroxide (MILK OF MAGNESIA) 800 MG/5ML suspension, Take 15 mLs by mouth daily as needed (leg cramps).  .  tamsulosin (FLOMAX) 0.4 MG CAPS capsule, TAKE 1 CAPSULE (0.4 MG TOTAL) BY MOUTH DAILY. Marland Kitchen  triamcinolone cream (KENALOG) 0.5 %, USE AS DIRECTED    Past medical history,  social, surgical and family history all reviewed in electronic medical record.  No pertanent information unless stated regarding to the chief complaint.   Review of Systems:  No headache, visual changes, nausea, vomiting, diarrhea, constipation, dizziness, abdominal pain, skin rash, fevers, chills, night sweats, weight loss, swollen lymph nodes, body aches, joint swelling, muscle aches, chest pain, shortness of breath, mood changes.   Objective  There were no vitals taken for this visit.    General: No apparent distress alert and oriented x3 mood and affect normal, dressed appropriately.  HEENT: Pupils equal, extraocular movements intact  Respiratory: Patient's speak in full sentences and does not appear short of breath  Cardiovascular: No lower extremity edema, non tender, no erythema  Skin: Warm dry intact with no signs of infection or rash on extremities or on axial skeleton.  Abdomen: Soft nontender  Neuro: Cranial nerves II through XII are intact, neurovascularly intact in all extremities with 2+ DTRs and 2+ pulses.  Lymph: No lymphadenopathy of posterior or anterior cervical chain or axillae bilaterally.  Gait normal with good balance and coordination.  MSK:  Non tender with full range of motion and good stability and symmetric strength and tone of shoulders, elbows, wrist, hip and ankles bilaterally.  Knee: valgus deformity noted. Large thigh to calf ratio.  Tender to  palpation over medial and PF joint line.  ROM full in flexion and extension and lower leg rotation. instability with valgus force.  painful patellar compression. Patellar glide with moderate crepitus. Patellar and quadriceps tendons unremarkable. Hamstring and quadriceps strength is normal. Contralateral knee shows   Impression and Recommendations:     This case required medical decision making of moderate complexity. The above documentation has been reviewed and is accurate and complete Lyndal Pulley, DO       Note: This dictation was prepared with Dragon dictation along with smaller phrase technology. Any transcriptional errors that result from this process are unintentional.

## 2019-03-19 ENCOUNTER — Ambulatory Visit (INDEPENDENT_AMBULATORY_CARE_PROVIDER_SITE_OTHER): Payer: Medicare Other | Admitting: Family Medicine

## 2019-03-19 ENCOUNTER — Encounter: Payer: Self-pay | Admitting: Family Medicine

## 2019-03-19 ENCOUNTER — Other Ambulatory Visit: Payer: Self-pay

## 2019-03-19 DIAGNOSIS — Z85828 Personal history of other malignant neoplasm of skin: Secondary | ICD-10-CM | POA: Diagnosis not present

## 2019-03-19 DIAGNOSIS — L570 Actinic keratosis: Secondary | ICD-10-CM | POA: Diagnosis not present

## 2019-03-19 DIAGNOSIS — M1712 Unilateral primary osteoarthritis, left knee: Secondary | ICD-10-CM

## 2019-03-19 NOTE — Progress Notes (Signed)
[name] [place], [place] [address] Phone: [phone] Subjective:   [name], am serving as a scribe for Dr. Hulan Saas.  I'm seeing this patient by the request  of:    CC: Left knee pain worsening  TDV:[id number]   02/24/2019 Patient be more primary caregiver for discussed patient's supplementation.  Increase activity as tolerated.  Follow-up again in 3 weeks Update 03/19/2019 [name] is a 85 y.o. male coming in with complaint of left knee pain. Patient states pain is constant. Is here for injection. Also will be fitted for custom knee brace.  Patient would like another injection today.  Patient is hoping that the injection as well as the knee brace would be helpful.  Patient wants to avoid any surgical intervention with him being the primary caregiver for his wife     Past Medical History:  Diagnosis Date  . ACTINIC KERATOSIS, HEAD 03/23/2008  . ALLERGIC RHINITIS 03/27/2007  . Arthritis   . BENIGN PROSTATIC HYPERTROPHY, WITH URINARY OBSTRUCTION 10/16/2007  . CAD 08/18/2007   "patient denies any issues with heart, does not see cardiologist"  . Carpal tunnel syndrome, bilateral 09/04/2016  . CARPAL TUNNEL SYNDROME, LEFT 04/13/2008  . Chronic back pain   . Chronic neck pain   . DVT (deep venous thrombosis) ([place])   . ERECTILE DYSFUNCTION, ORGANIC 02/20/2010  . Gout, unspecified 02/20/2010  . Headache(784.0)    migraines hx of  . History of melanoma   . HYPERLIPIDEMIA, WITH HIGH HDL 10/18/2008  . HYPERTENSION 05/09/2007   sees Dr. Benay Pillow  . KNEE PAIN 10/10/2009  . LOW BACK PAIN 03/27/2007  . NEOPLASM, MALIGNANT, SKIN, TRUNK 05/09/2007  . Patellar tendinitis 10/10/2009  . SKIN CANCER, HX OF 03/27/2007  . UNS ADVRS EFF UNS RX MEDICINAL&BIOLOGICAL SBSTNC 08/18/2007  . Urinary urgency    Past Surgical History:  Procedure Laterality Date  . APPENDECTOMY  1969  . BACK SURGERY    . BLEPHAROPLASTY    . CARPAL TUNNEL RELEASE     . CHOLECYSTECTOMY    . ESOPHAGOGASTRODUODENOSCOPY (EGD) WITH PROPOFOL N/A 04/26/2015   Procedure: ESOPHAGOGASTRODUODENOSCOPY (EGD) WITH PROPOFOL;  Surgeon: Inda Castle, MD;  Location: [facility];  Service: Endoscopy;  Laterality: N/A;  . EYE SURGERY     bilateral cataracts removed and vitrectomies  . JOINT REPLACEMENT Right    knee  . KNEE ARTHROSCOPY    . LAMINECTOMY    . LATERAL FUSION LUMBAR SPINE, TRANSVERSE    . LUMBAR LAMINECTOMY/DECOMPRESSION MICRODISCECTOMY Left 11/03/2012   Procedure: LUMBAR LAMINECTOMY/DECOMPRESSION MICRODISCECTOMY 1 LEVEL;  Surgeon: Hosie Spangle, MD;  Location: [facility];  Service: Neurosurgery;  Laterality: Left;  LEFT L5S1 laminotomy foraminotomy and possible microdiskectomy  . MELANOMA EXCISION    . NASAL SINUS SURGERY    . POSTERIOR CERVICAL FUSION/FORAMINOTOMY Right 10/01/2012   Procedure: POSTERIOR CERVICAL FUSION/FORAMINOTOMY LEVEL 1;  Surgeon: Hosie Spangle, MD;  Location: [facility];  Service: Neurosurgery;  Laterality: Right;  Right Cervical seven thoracic one Cervical laminectomy/foraminotomy with posterior cervical arthrodesis   . POSTERIOR LUMBAR FUSION N/A 05/23/2013   Procedure: exploration of lumbar wound, explantation of left interbody implant.;  Surgeon: Hosie Spangle, MD;  Location: [facility];  Service: Neurosurgery;  Laterality: N/A;  . RECTAL SURGERY     Fissure  . VITRECTOMY     right and left 2013   Social History   Socioeconomic History  . Marital status: Married  Spouse name: Not on file  . Number of children: 3  . Years of education: 48  . Highest education level: Not on file  Occupational History  . Occupation: Retired  Scientific laboratory technician  . Financial resource strain: Not on file  . Food insecurity    Worry: Not on file    Inability: Not on file  . Transportation needs    Medical: Not on file    Non-medical: Not on file  Tobacco Use  . Smoking status: Former Smoker    Packs/day: 1.00    Years: 33.00     Pack years: 33.00    Quit date: 09/29/1981    Years since quitting: 37.4  . Smokeless tobacco: Never Used  Substance and Sexual Activity  . Alcohol use: Yes    Alcohol/week: 3.0 standard drinks    Types: 3 Standard drinks or equivalent per week    Comment: Social  . Drug use: [name]  . Sexual activity: Yes    Partners: Female    Comment: Married  Lifestyle  . Physical activity    Days per week: Not on file    Minutes per session: Not on file  . Stress: Not on file  Relationships  . Social Herbalist on phone: Not on file    Gets together: Not on file    Attends religious service: Not on file    Active member of club or organization: Not on file    Attends meetings of clubs or organizations: Not on file    Relationship status: Not on file  Other Topics Concern  . Not on file  Social History Narrative   Lives at home w/ his wife   Left-handed   Caffeine: 1 cup coffee daily   1 son alive, 2 sons deceased   Retired Building control surveyor, Furniture conservator/restorer.    Education: GED   Allergies  Allergen Reactions  . Amlodipine     Ankle swelling  . Gabapentin     Lower extremity edema  . Lyrica [Pregabalin] Swelling   Family History  Problem Relation Age of Onset  . Dementia Mother   . Anemia Father   . Heart disease Father   . Stroke Sister        ICH  . Lung disease Sister     Current Outpatient Medications (Endocrine & Metabolic):  .  predniSONE (DELTASONE) 10 MG tablet, Take 1 tablet (10 mg total) by mouth daily with breakfast.  Current Outpatient Medications (Cardiovascular):  .  amLODipine (NORVASC) 10 MG tablet, TAKE 1 TABLET BY MOUTH EVERY DAY .  carvedilol (COREG) 25 MG tablet, TAKE 1 TABLET BY MOUTH 2 TIMES DAILY WITH A MEAL .  furosemide (LASIX) 20 MG tablet, Take 1 or 2 tablets a day as needed for shortness of breath or swelling in the legs / feet. .  hydrALAZINE (APRESOLINE) 50 MG tablet, Take 1 tablet (50 mg total) by mouth every 8 (eight) hours. Marland Kitchen  spironolactone  (ALDACTONE) 25 MG tablet, TAKE 1 TABLET BY MOUTH EVERY DAY  Current Outpatient Medications (Respiratory):  .  fluticasone (FLONASE) 50 MCG/ACT nasal spray, Place 1 spray into both nostrils every evening.   Current Outpatient Medications (Analgesics):  Marland Kitchen  Oxycodone HCl 10 MG TABS, Take 10 mg by mouth 3 (three) times daily as needed (pain).   Current Outpatient Medications (Hematological):  Marland Kitchen  [facility] VITAMIN B12 1000 MCG tablet, TAKE 1 TABLET (1,000 MCG TOTAL) BY MOUTH DAILY. (Patient taking differently: Take 1,000 mcg by  mouth daily as needed (energy). )  Current Outpatient Medications (Other):  .  finasteride (PROSCAR) 5 MG tablet, TAKE 1 TABLET BY MOUTH EVERY DAY .  magnesium hydroxide (MILK OF MAGNESIA) 800 MG/5ML suspension, Take 15 mLs by mouth daily as needed (leg cramps).  .  tamsulosin (FLOMAX) 0.4 MG CAPS capsule, TAKE 1 CAPSULE (0.4 MG TOTAL) BY MOUTH DAILY. Marland Kitchen  triamcinolone cream (KENALOG) 0.5 %, USE AS DIRECTED    Past medical history, social, surgical and family history all reviewed in electronic medical record.  [name] pertanent information unless stated regarding to the chief complaint.   Review of Systems:  [name] headache, visual changes, nausea, vomiting, diarrhea, constipation, dizziness, abdominal pain, skin rash, fevers, chills, night sweats, weight loss, swollen lymph nodes, body aches, joint swelling,  chest pain, shortness of breath, mood changes.  Positive muscle aches  Objective  Blood pressure (!) 122/52, pulse 86, height 6' (1.829 m), SpO2 97 %.    General: [name] apparent distress alert and oriented x3 mood and affect normal, dressed appropriately.  HEENT: Pupils equal, extraocular movements intact  Respiratory: Patient's speak in full sentences and does not appear short of breath  Cardiovascular: [name] lower extremity edema, non tender, [name] erythema  Skin: Warm dry intact with [name] signs of infection or rash on extremities or on axial skeleton.  Abdomen: Soft nontender   Neuro: Cranial nerves II through XII are intact, neurovascularly intact in all extremities with 2+ DTRs and 2+ pulses.  Lymph: [name] lymphadenopathy of posterior or anterior cervical chain or axillae bilaterally.  Gait antalgic MSK:  tender with limited range of motion and stability and symmetric strength and tone of shoulders, elbows, wrist, hip, and ankles bilaterally.   Knee: Left valgus deformity noted. Large thigh to calf ratio.  Tender to palpation over medial and PF joint line.  ROM full in flexion and extension and lower leg rotation. instability with valgus force.  painful patellar compression. Patellar glide with moderate crepitus. Patellar and quadriceps tendons unremarkable. Hamstring and quadriceps strength is normal.   After informed written and verbal consent, patient was seated on exam table. Left knee was prepped with alcohol swab and utilizing anterolateral approach, patient's left knee space was injected with 4:1  marcaine 0.5%: Kenalog 40mg /dL. Patient tolerated the procedure well without immediate complications.    Impression and Recommendations:     This case required medical decision making of moderate complexity. The above documentation has been reviewed and is accurate and complete Lyndal Pulley, DO       Note: This dictation was prepared with Dragon dictation along with smaller phrase technology. Any transcriptional errors that result from this process are unintentional.

## 2019-03-19 NOTE — Patient Instructions (Signed)
Injected knee today

## 2019-03-19 NOTE — Assessment & Plan Note (Signed)
Repeat injection given today.  Tolerated the procedure well.  We discussed that viscosupplementation would be the next steps.  Patient will continue with the conservative therapy.  Follow-up again in 4 to 8 weeks.

## 2019-03-25 ENCOUNTER — Other Ambulatory Visit: Payer: Self-pay | Admitting: Internal Medicine

## 2019-04-02 ENCOUNTER — Encounter: Payer: Self-pay | Admitting: Internal Medicine

## 2019-04-06 ENCOUNTER — Encounter: Payer: Self-pay | Admitting: Internal Medicine

## 2019-04-06 ENCOUNTER — Telehealth: Payer: Self-pay | Admitting: Internal Medicine

## 2019-04-06 NOTE — Telephone Encounter (Signed)
Dr. Sharlet Salina will talk to patient to morrow about this refill

## 2019-04-06 NOTE — Telephone Encounter (Signed)
Medication Refill - Medication: prednisone  Has the patient contacted their pharmacy? Yes.   Pt called stating he is having trouble with his gout and is requesting this be sent in as soon as possible as he os having trouble walking and his wife is an invalid. Please advise.  (Agent: If no, request that the patient contact the pharmacy for the refill.) (Agent: If yes, when and what did the pharmacy advise?)  Preferred Pharmacy (with phone number or street name):  [id number] [address], [place] [address]  Phone: [phone] Fax: [phone]  Not a 24 hour pharmacy; exact hours not known.     Agent: Please be advised that RX refills may take up to 3 business days. We ask that you follow-up with your pharmacy.

## 2019-04-07 ENCOUNTER — Other Ambulatory Visit: Payer: Self-pay

## 2019-04-07 ENCOUNTER — Encounter: Payer: Self-pay | Admitting: Internal Medicine

## 2019-04-07 ENCOUNTER — Ambulatory Visit (INDEPENDENT_AMBULATORY_CARE_PROVIDER_SITE_OTHER): Payer: Medicare Other | Admitting: Internal Medicine

## 2019-04-07 VITALS — BP 110/60 | HR 78 | Temp 98.6°F | Ht 72.0 in | Wt 200.0 lb

## 2019-04-07 DIAGNOSIS — M130 Polyarthritis, unspecified: Secondary | ICD-10-CM | POA: Diagnosis not present

## 2019-04-07 DIAGNOSIS — Z23 Encounter for immunization: Secondary | ICD-10-CM | POA: Diagnosis not present

## 2019-04-07 MED ORDER — PREDNISONE 10 MG PO TABS: 10.0000 mg | ORAL_TABLET | Freq: Every day | ORAL | 3 refills | Status: DC

## 2019-04-07 MED ORDER — PREDNISONE 20 MG PO TABS: ORAL_TABLET | ORAL | 0 refills | Status: DC

## 2019-04-07 NOTE — Assessment & Plan Note (Signed)
We have talked again about prednisone and the toxicities of long term usage including spinal deterioration which could result in paralysis or bone deterioration which could lead to fracture or falls or permanent immobility and he is accepting of these risks. Have asked him not to get any more steroid injections in his legs as he is taking oral steroids. Will rx 40 mg daily for 1 week then 20 mg daily for 1 week then back to his chronic dosage 10 mg daily. He is counseled extensively that he cannot take increased dosage without advice as this is a medication which is very serious and dosages should only be adjusted by a doctor.

## 2019-04-07 NOTE — Patient Instructions (Signed)
Take 2 pills of the 20 mg prednisone for 1 week, then decrease to 1 pill 20 mg prednisone for 1 week, then resume the 10 mg prednisone pill daily.

## 2019-04-07 NOTE — Progress Notes (Signed)
   Subjective:   Patient ID: [name], male    DOB: 05/12/1932, 85 y.o.   MRN: [id number]  HPI The patient is an 85 YO man coming in for concerns about worsening pain in his knees and hips as well as potential gout flare. He has been taking prednisone 10 mg daily. He then started having more pain in his knees and took 30 mg daily prednisone for a few days. This has helped reduce pain in the past and get it back to normal. It did not help this time as he ran out before he could be relief and he has now been off prednisone at all for a week or so. He then started having a gout flare in his left foot after running out of prednisone. He has chronic back and foot pain and is on other pain medication which has never worked well for this pain. He is a caregiver for his wife and this is his priority to be able to continue doing this. He is aware of the risks of prednisone chronically and does not care as he feels he has maximum 5 years of life left anyway.   Review of Systems  Constitutional: Positive for activity change.  HENT: Negative.   Eyes: Negative.   Respiratory: Negative for cough, chest tightness and shortness of breath.   Cardiovascular: Negative for chest pain, palpitations and leg swelling.  Gastrointestinal: Negative for abdominal distention, abdominal pain, constipation, diarrhea, nausea and vomiting.  Musculoskeletal: Positive for arthralgias, back pain, gait problem, joint swelling and myalgias.  Skin: Negative.   Neurological: Negative for dizziness, weakness, light-headedness and numbness.  Psychiatric/Behavioral: Negative.     Objective:  Physical Exam Constitutional:      Appearance: He is well-developed.  HENT:     Head: Normocephalic and atraumatic.  Neck:     Musculoskeletal: Normal range of motion.  Cardiovascular:     Rate and Rhythm: Normal rate and regular rhythm.  Pulmonary:     Effort: Pulmonary effort is normal. No respiratory distress.     Breath sounds:  Normal breath sounds. No wheezing or rales.  Abdominal:     General: Bowel sounds are normal. There is no distension.     Palpations: Abdomen is soft.     Tenderness: There is no abdominal tenderness. There is no rebound.  Musculoskeletal:        General: Tenderness present.  Skin:    General: Skin is warm and dry.  Neurological:     Mental Status: He is alert and oriented to person, place, and time.     Coordination: Coordination normal.     Vitals:   04/07/19 1046  BP: 110/60  Pulse: 78  Temp: 98.6 F (37 C)  TempSrc: Oral  SpO2: 95%  Weight: 200 lb (90.7 kg)  Height: 6' (1.829 m)   Flu shot given at visit  Assessment & Plan:  Visit time 40 minutes: greater than 50% of that time was spent in face to face counseling and coordination of care with the patient: counseled about prednisone long term use and toxicity as well as need to not stop abruptly and the dangers associated with this.

## 2019-04-08 ENCOUNTER — Encounter: Payer: Self-pay | Admitting: Family Medicine

## 2019-04-13 ENCOUNTER — Encounter: Payer: Self-pay | Admitting: Internal Medicine

## 2019-04-13 ENCOUNTER — Encounter: Payer: Self-pay | Admitting: Family Medicine

## 2019-04-13 DIAGNOSIS — M5416 Radiculopathy, lumbar region: Secondary | ICD-10-CM

## 2019-04-13 DIAGNOSIS — G8929 Other chronic pain: Secondary | ICD-10-CM

## 2019-04-17 ENCOUNTER — Ambulatory Visit (INDEPENDENT_AMBULATORY_CARE_PROVIDER_SITE_OTHER): Payer: Medicare Other | Admitting: Family Medicine

## 2019-04-17 ENCOUNTER — Encounter: Payer: Self-pay | Admitting: Family Medicine

## 2019-04-17 ENCOUNTER — Other Ambulatory Visit: Payer: Self-pay

## 2019-04-17 DIAGNOSIS — M19071 Primary osteoarthritis, right ankle and foot: Secondary | ICD-10-CM | POA: Diagnosis not present

## 2019-04-17 NOTE — Assessment & Plan Note (Signed)
I attempted viscosupplementation today.  Hoping that it will make some improvement.  I expect no patient to need a knee replacement.  We discussed we would refer accordingly.  Patient wants to do some research on different providers and then will tell us where to put the referral in.  Encourage patient to continue to wear the brace at the moment.  Follow-up again as needed

## 2019-04-17 NOTE — Progress Notes (Signed)
[name] [place], [place] [address] Phone: [phone] Subjective:   I [name] am serving as a Education administrator for Dr. Hulan Saas.   CC: Knee pain  [id number]   03/19/2019 Repeat injection given today.  Tolerated the procedure well.  We discussed that viscosupplementation would be the next steps.  Patient will continue with the conservative therapy.  Follow-up again in 4 to 8 weeks.  04/17/2019 [name] is a 85 y.o. male coming in with complaint of left knee pain. Would like an injection. States he wants to get the knee replaced.  Has been approved for [place] supplementation     Past Medical History:  Diagnosis Date  . ACTINIC KERATOSIS, HEAD 03/23/2008  . ALLERGIC RHINITIS 03/27/2007  . Arthritis   . BENIGN PROSTATIC HYPERTROPHY, WITH URINARY OBSTRUCTION 10/16/2007  . CAD 08/18/2007   "patient denies any issues with heart, does not see cardiologist"  . Carpal tunnel syndrome, bilateral 09/04/2016  . CARPAL TUNNEL SYNDROME, LEFT 04/13/2008  . Chronic back pain   . Chronic neck pain   . DVT (deep venous thrombosis) ([place])   . ERECTILE DYSFUNCTION, ORGANIC 02/20/2010  . Gout, unspecified 02/20/2010  . Headache(784.0)    migraines hx of  . History of melanoma   . HYPERLIPIDEMIA, WITH HIGH HDL 10/18/2008  . HYPERTENSION 05/09/2007   sees Dr. Benay Pillow  . KNEE PAIN 10/10/2009  . LOW BACK PAIN 03/27/2007  . NEOPLASM, MALIGNANT, SKIN, TRUNK 05/09/2007  . Patellar tendinitis 10/10/2009  . SKIN CANCER, HX OF 03/27/2007  . UNS ADVRS EFF UNS RX MEDICINAL&BIOLOGICAL SBSTNC 08/18/2007  . Urinary urgency    Past Surgical History:  Procedure Laterality Date  . APPENDECTOMY  1969  . BACK SURGERY    . BLEPHAROPLASTY    . CARPAL TUNNEL RELEASE    . CHOLECYSTECTOMY    . ESOPHAGOGASTRODUODENOSCOPY (EGD) WITH PROPOFOL N/A 04/26/2015   Procedure: ESOPHAGOGASTRODUODENOSCOPY (EGD) WITH PROPOFOL;  Surgeon: Inda Castle, MD;  [facility]: [facility];   Service: Endoscopy;  Laterality: N/A;  . EYE SURGERY     bilateral cataracts removed and vitrectomies  . JOINT REPLACEMENT Right    knee  . KNEE ARTHROSCOPY    . LAMINECTOMY    . LATERAL FUSION LUMBAR SPINE, TRANSVERSE    . LUMBAR LAMINECTOMY/DECOMPRESSION MICRODISCECTOMY Left 11/03/2012   Procedure: LUMBAR LAMINECTOMY/DECOMPRESSION MICRODISCECTOMY 1 LEVEL;  Surgeon: Hosie Spangle, MD;  Location: [facility];  Service: Neurosurgery;  Laterality: Left;  LEFT L5S1 laminotomy foraminotomy and possible microdiskectomy  . MELANOMA EXCISION    . NASAL SINUS SURGERY    . POSTERIOR CERVICAL FUSION/FORAMINOTOMY Right 10/01/2012   Procedure: POSTERIOR CERVICAL FUSION/FORAMINOTOMY LEVEL 1;  Surgeon: Hosie Spangle, MD;  Location: [facility];  Service: Neurosurgery;  Laterality: Right;  Right Cervical seven thoracic one Cervical laminectomy/foraminotomy with posterior cervical arthrodesis   . POSTERIOR LUMBAR FUSION N/A 05/23/2013   Procedure: exploration of lumbar wound, explantation of left interbody implant.;  Surgeon: Hosie Spangle, MD;  Location: [facility];  Service: Neurosurgery;  Laterality: N/A;  . RECTAL SURGERY     Fissure  . VITRECTOMY     right and left 2013   Social History   Socioeconomic History  . Marital status: Married    Spouse name: Not on file  . Number of children: 3  . Years of education: 29  . Highest education level: Not on file  Occupational History  . Occupation: Retired  Scientific laboratory technician  .  Financial resource strain: Not on file  . Food insecurity    Worry: Not on file    Inability: Not on file  . Transportation needs    Medical: Not on file    Non-medical: Not on file  Tobacco Use  . Smoking status: Former Smoker    Packs/day: 1.00    Years: 33.00    Pack years: 33.00    Quit date: 09/29/1981    Years since quitting: 37.5  . Smokeless tobacco: Never Used  Substance and Sexual Activity  . Alcohol use: Yes    Alcohol/week: 3.0 standard drinks     Types: 3 Standard drinks or equivalent per week    Comment: Social  . Drug use: No  . Sexual activity: Yes    Partners: Female    Comment: Married  Lifestyle  . Physical activity    Days per week: Not on file    Minutes per session: Not on file  . Stress: Not on file  Relationships  . Social Herbalist on phone: Not on file    Gets together: Not on file    Attends religious service: Not on file    Active member of club or organization: Not on file    Attends meetings of clubs or organizations: Not on file    Relationship status: Not on file  Other Topics Concern  . Not on file  Social History Narrative   Lives at home w/ his wife   Left-handed   Caffeine: 1 cup coffee daily   1 son alive, 2 sons deceased   Retired Building control surveyor, Furniture conservator/restorer.    Education: GED   Allergies  Allergen Reactions  . Amlodipine     Ankle swelling  . Gabapentin     Lower extremity edema  . Lyrica [Pregabalin] Swelling   Family History  Problem Relation Age of Onset  . Dementia Mother   . Anemia Father   . Heart disease Father   . Stroke Sister        ICH  . Lung disease Sister     Current Outpatient Medications (Endocrine & Metabolic):  .  predniSONE (DELTASONE) 10 MG tablet, Take 1 tablet (10 mg total) by mouth daily with breakfast. .  predniSONE (DELTASONE) 20 MG tablet, Take 2 pills daily for 1 week, then take 1 pill daily for 1 week, then resume separate 10 mg daily dosing.  Current Outpatient Medications (Cardiovascular):  .  amLODipine (NORVASC) 10 MG tablet, TAKE 1 TABLET BY MOUTH EVERY DAY .  carvedilol (COREG) 25 MG tablet, TAKE 1 TABLET BY MOUTH 2 TIMES DAILY WITH A MEAL .  furosemide (LASIX) 20 MG tablet, Take 1 or 2 tablets a day as needed for shortness of breath or swelling in the legs / feet. .  hydrALAZINE (APRESOLINE) 50 MG tablet, Take 1 tablet (50 mg total) by mouth every 8 (eight) hours. Marland Kitchen  spironolactone (ALDACTONE) 25 MG tablet, TAKE 1 TABLET BY MOUTH EVERY DAY   Current Outpatient Medications (Respiratory):  .  fluticasone (FLONASE) 50 MCG/ACT nasal spray, Place 1 spray into both nostrils every evening.   Current Outpatient Medications (Analgesics):  Marland Kitchen  Oxycodone HCl 10 MG TABS, Take 10 mg by mouth 3 (three) times daily as needed (pain).   Current Outpatient Medications (Hematological):  Marland Kitchen  [facility] VITAMIN B12 1000 MCG tablet, TAKE 1 TABLET (1,000 MCG TOTAL) BY MOUTH DAILY. (Patient taking differently: Take 1,000 mcg by mouth daily as needed (energy). )  Current  Outpatient Medications (Other):  .  finasteride (PROSCAR) 5 MG tablet, TAKE 1 TABLET BY MOUTH EVERY DAY .  magnesium hydroxide (MILK OF MAGNESIA) 800 MG/5ML suspension, Take 15 mLs by mouth daily as needed (leg cramps).  .  tamsulosin (FLOMAX) 0.4 MG CAPS capsule, TAKE 1 CAPSULE (0.4 MG TOTAL) BY MOUTH DAILY. Marland Kitchen  triamcinolone cream (KENALOG) 0.5 %, USE AS DIRECTED    Past medical history, social, surgical and family history all reviewed in electronic medical record.  No pertanent information unless stated regarding to the chief complaint.   Review of Systems:  No headache, visual changes, nausea, vomiting, diarrhea, constipation, dizziness, abdominal pain, skin rash, fevers, chills, night sweats, weight loss, swollen lymph nodes, body aches, joint swelling, chest pain, shortness of breath, mood changes.  Positive muscle aches  Objective  Blood pressure 130/60, pulse 83, height 6' (1.829 m), weight 203 lb (92.1 kg), SpO2 95 %.   General: No apparent distress alert and oriented x3 mood and affect normal, dressed appropriately.  HEENT: Pupils equal, extraocular movements intact  Respiratory: Patient's speak in full sentences and does not appear short of breath  Cardiovascular: No lower extremity edema, non tender, no erythema  Skin: Warm dry intact with no signs of infection or rash on extremities or on axial skeleton.  Abdomen: Soft nontender  Neuro: Cranial nerves II through XII are  intact, neurovascularly intact in all extremities with 2+ DTRs and 2+ pulses.  Lymph: No lymphadenopathy of posterior or anterior cervical chain or axillae bilaterally.  Gait severe antalgic MSK: Arthritic changes of multiple joints Knee: Left valgus deformity noted. Large thigh to calf ratio.  Tender to palpation over medial and PF joint line.  ROM full in flexion and extension and lower leg rotation. instability with valgus force.  painful patellar compression. Patellar glide with moderate crepitus. Patellar and quadriceps tendons unremarkable. Hamstring and quadriceps strength is normal. Contralateral knee shows replacement in good alignment  After informed written and verbal consent, patient was seated on exam table. Left knee was prepped with alcohol swab and utilizing anterolateral approach, patient's left knee space was injected with 22 mg/mL of Monovisc (sodium hyaluronate) in a prefilled syringe was injected easily into the knee through a 22-gauge needle..Patient tolerated the procedure well without immediate complications.    Impression and Recommendations:     This case required medical decision making of moderate complexity. The above documentation has been reviewed and is accurate and complete Lyndal Pulley, DO       Note: This dictation was prepared with Dragon dictation along with smaller phrase technology. Any transcriptional errors that result from this process are unintentional.

## 2019-04-20 ENCOUNTER — Encounter: Payer: Self-pay | Admitting: Family Medicine

## 2019-04-21 ENCOUNTER — Other Ambulatory Visit: Payer: Self-pay

## 2019-04-21 DIAGNOSIS — M1712 Unilateral primary osteoarthritis, left knee: Secondary | ICD-10-CM

## 2019-04-21 NOTE — Progress Notes (Unsigned)
MR

## 2019-04-22 ENCOUNTER — Ambulatory Visit (INDEPENDENT_AMBULATORY_CARE_PROVIDER_SITE_OTHER): Payer: Medicare Other | Admitting: Orthopedic Surgery

## 2019-04-22 ENCOUNTER — Encounter: Payer: Self-pay | Admitting: Internal Medicine

## 2019-04-22 ENCOUNTER — Encounter: Payer: Self-pay | Admitting: Orthopedic Surgery

## 2019-04-22 ENCOUNTER — Ambulatory Visit (INDEPENDENT_AMBULATORY_CARE_PROVIDER_SITE_OTHER): Payer: Medicare Other

## 2019-04-22 ENCOUNTER — Encounter: Payer: Self-pay | Admitting: Family Medicine

## 2019-04-22 DIAGNOSIS — M79605 Pain in left leg: Secondary | ICD-10-CM

## 2019-04-22 DIAGNOSIS — M25562 Pain in left knee: Secondary | ICD-10-CM | POA: Diagnosis not present

## 2019-04-22 NOTE — Progress Notes (Signed)
Office Visit Note   Patient: [name]           Date of Birth: February 23, 1932           MRN: [id number] Visit Date: 04/22/2019 Requested by: Lyndal Pulley, DO [place],  [place] [id number] PCP: Hoyt Koch, MD  Subjective: Chief Complaint  Patient presents with  . Left Knee - Pain    HPI: [name] is a patient with left knee pain.  Denies any history of injury.  Has a long history of left knee symptoms.  Reports weakness and giving way.  He takes care of his wife who is becoming more debilitated.  Had previous right total knee replacement done in 2012 and he is done well with that.  He describes having some issues with blood flow in the legs.  He also has tried a knee brace without relief.  Had gel injection done within a month and it also did not give him relief in that left knee.  He is taking daily steroids for multiple joint complaints as well as gout.  He reports debilitating pain.  Notably he had back surgery in 2015 and because of some issue with that back surgery currently takes 40 mg of oxycodone per day.              ROS: All systems reviewed are negative as they relate to the chief complaint within the history of present illness.  Patient denies  fevers or chills.   Assessment & Plan: Visit Diagnoses:  1. Left knee pain, unspecified chronicity     Plan: Impression is left knee arthritis end-stage with fairly incapacitating pain.  Discussed with him at length operative and nonoperative treatment options.  He is essentially had all nonoperative treatment options.  He states he cannot live with the pain the way it is.  He did well with the right knee replacement but I did tell him that at that time he was not taking 40 mg of oxycodone a day.  Pain management will be difficult for him after the surgery.  He also has the attendant risk of infection nerve vessel damage incomplete pain relief knee stiffness as well as the extensive rehabilitative process  required for functional recovery.  Because of his diminished pedal pulses I would like to get ABI preoperatively.  Also I would like for him to have some type of assessment of his suitability in terms of physical condition for knee replacement.  He denies any cardiac issues.  Plan for surgery in the near future per patient wishes.  All questions answered  Follow-Up Instructions: No follow-ups on file.   Orders:  Orders Placed This Encounter  Procedures  . XR Knee 1-2 Views Left   No orders of the defined types were placed in this encounter.     Procedures: No procedures performed   Clinical Data: No additional findings.  Objective: Vital Signs: There were no vitals taken for this visit.  Physical Exam:   Constitutional: Patient appears well-developed HEENT:  Head: Normocephalic Eyes:EOM are normal Neck: Normal range of motion Cardiovascular: Normal rate Pulmonary/chest: Effort normal Neurologic: Patient is alert Skin: Skin is warm Psychiatric: Patient has normal mood and affect    Ortho Exam: Ortho exam demonstrates excellent range of motion of the right knee with no effusion.  Left knee has flexion contracture about 10 degrees along with stable collateral cruciate ligaments.  Pedal pulses diminished on that left-hand side but the  foot is warm and perfused.  No nerve root tension signs no groin pain with internal X rotation of the leg.  Skin is intact in the left knee region but he has multiple bruising areas over the bilateral upper extremities.  Specialty Comments:  No specialty comments available.  Imaging: Xr Knee 1-2 Views Left  Result Date: 04/22/2019 AP lateral left knee reviewed.  Tricompartmental end-stage arthritis noted in that left knee.  Right total knee prosthesis in good position alignment.  No acute fracture or dislocation.    PMFS History: Patient Active Problem List   Diagnosis Date Noted  . UTI symptoms 10/17/2018  . Acute diastolic CHF  (congestive heart failure) ([place]) 08/10/2018  . Leg wound, left 03/19/2018  . Right arm pain 11/11/2017  . Primary osteoarthritis of right foot 08/21/2017  . Syncope 02/27/2017  . Arthritis of right ankle 12/18/2016  . Carpal tunnel syndrome, bilateral 09/04/2016  . Leg swelling 08/16/2016  . Hand weakness 07/12/2016  . Degenerative arthritis of left knee 06/28/2016  . Iron deficiency anemia 03/16/2016  . Arthritis of left acromioclavicular joint 01/03/2016  . Rotator cuff disorder 01/03/2016  . Osteoarthritis of left knee 04/30/2015  . Baker's cyst of knee 04/30/2015  . Esophageal stricture 04/26/2015  . Benign prostatic hyperplasia 02/21/2015  . Constipation 02/21/2015  . Myalgia and myositis 11/18/2014  . Polyarticular arthritis 07/19/2013  . Chronic radicular low back pain 12/15/2012  . History of gout 02/20/2010  . ERECTILE DYSFUNCTION, ORGANIC 02/20/2010  . Hyperlipidemia 10/18/2008  . CAD 08/18/2007  . Essential hypertension 05/09/2007  . ALLERGIC RHINITIS 03/27/2007   Past Medical History:  Diagnosis Date  . ACTINIC KERATOSIS, HEAD 03/23/2008  . ALLERGIC RHINITIS 03/27/2007  . Arthritis   . BENIGN PROSTATIC HYPERTROPHY, WITH URINARY OBSTRUCTION 10/16/2007  . CAD 08/18/2007   "patient denies any issues with heart, does not see cardiologist"  . Carpal tunnel syndrome, bilateral 09/04/2016  . CARPAL TUNNEL SYNDROME, LEFT 04/13/2008  . Chronic back pain   . Chronic neck pain   . DVT (deep venous thrombosis) ([place])   . ERECTILE DYSFUNCTION, ORGANIC 02/20/2010  . Gout, unspecified 02/20/2010  . Headache(784.0)    migraines hx of  . History of melanoma   . HYPERLIPIDEMIA, WITH HIGH HDL 10/18/2008  . HYPERTENSION 05/09/2007   sees Dr. Benay Pillow  . KNEE PAIN 10/10/2009  . LOW BACK PAIN 03/27/2007  . NEOPLASM, MALIGNANT, SKIN, TRUNK 05/09/2007  . Patellar tendinitis 10/10/2009  . SKIN CANCER, HX OF 03/27/2007  . UNS ADVRS EFF UNS RX MEDICINAL&BIOLOGICAL SBSTNC 08/18/2007  . Urinary  urgency     Family History  Problem Relation Age of Onset  . Dementia Mother   . Anemia Father   . Heart disease Father   . Stroke Sister        ICH  . Lung disease Sister     Past Surgical History:  Procedure Laterality Date  . APPENDECTOMY  1969  . BACK SURGERY    . BLEPHAROPLASTY    . CARPAL TUNNEL RELEASE    . CHOLECYSTECTOMY    . ESOPHAGOGASTRODUODENOSCOPY (EGD) WITH PROPOFOL N/A 04/26/2015   Procedure: ESOPHAGOGASTRODUODENOSCOPY (EGD) WITH PROPOFOL;  Surgeon: Inda Castle, MD;  Location: [facility];  Service: Endoscopy;  Laterality: N/A;  . EYE SURGERY     bilateral cataracts removed and vitrectomies  . JOINT REPLACEMENT Right    knee  . KNEE ARTHROSCOPY    . LAMINECTOMY    . LATERAL FUSION LUMBAR SPINE, TRANSVERSE    .  LUMBAR LAMINECTOMY/DECOMPRESSION MICRODISCECTOMY Left 11/03/2012   Procedure: LUMBAR LAMINECTOMY/DECOMPRESSION MICRODISCECTOMY 1 LEVEL;  Surgeon: Hosie Spangle, MD;  Location: [facility];  Service: Neurosurgery;  Laterality: Left;  LEFT L5S1 laminotomy foraminotomy and possible microdiskectomy  . MELANOMA EXCISION    . NASAL SINUS SURGERY    . POSTERIOR CERVICAL FUSION/FORAMINOTOMY Right 10/01/2012   Procedure: POSTERIOR CERVICAL FUSION/FORAMINOTOMY LEVEL 1;  Surgeon: Hosie Spangle, MD;  Location: [facility];  Service: Neurosurgery;  Laterality: Right;  Right Cervical seven thoracic one Cervical laminectomy/foraminotomy with posterior cervical arthrodesis   . POSTERIOR LUMBAR FUSION N/A 05/23/2013   Procedure: exploration of lumbar wound, explantation of left interbody implant.;  Surgeon: Hosie Spangle, MD;  Location: [facility];  Service: Neurosurgery;  Laterality: N/A;  . RECTAL SURGERY     Fissure  . VITRECTOMY     right and left 2013   Social History   Occupational History  . Occupation: Retired  Tobacco Use  . Smoking status: Former Smoker    Packs/day: 1.00    Years: 33.00    Pack years: 33.00    Quit date: 09/29/1981    Years  since quitting: 37.5  . Smokeless tobacco: Never Used  Substance and Sexual Activity  . Alcohol use: Yes    Alcohol/week: 3.0 standard drinks    Types: 3 Standard drinks or equivalent per week    Comment: Social  . Drug use: No  . Sexual activity: Yes    Partners: Female    Comment: Married

## 2019-04-23 ENCOUNTER — Telehealth: Payer: Self-pay | Admitting: Internal Medicine

## 2019-04-23 ENCOUNTER — Telehealth: Payer: Self-pay | Admitting: Orthopedic Surgery

## 2019-04-23 ENCOUNTER — Other Ambulatory Visit: Payer: Self-pay | Admitting: Internal Medicine

## 2019-04-23 DIAGNOSIS — M25562 Pain in left knee: Secondary | ICD-10-CM

## 2019-04-23 NOTE — Telephone Encounter (Signed)
Copied from [place] [phone]. Topic: General - Inquiry >> Apr 23, 2019  4:11 PM Virl Axe D wrote: Reason for CRM: Debbie with [facility] stated pt needs a stat referral so that he can get left total knee replacement surgery. Pt also needs a surgical clearance.. Forms are being faxed over today for both. Please advise. Fax#[phone] if information is not sent through Epic.

## 2019-04-23 NOTE — Telephone Encounter (Signed)
Patient called regarding the status of ABI referral.  He states he is in a great deal of pain and would like to proceed with surgery ASAP.   Request for surgical clearance letter along with request to evaluate the blood flow (BILATERAL ABI) sent by fax to Dr. Sharlet [place] at [phone] yesterday.  Surgery date of 05-07-19 is also indicated on the clearance.  I called the office at [phone] and spoke with Caryn. She will forward a message indicating the urgency for the referral. Caryn also provided a different fax number to which I could send the request.  Confirmation of receipt to [phone].  I Spoke with patient and he stated he had also left a message through [employer]. I will update as I receive information.

## 2019-04-23 NOTE — Telephone Encounter (Signed)
We can send referral for bilateral ABIs that would be fine thanks

## 2019-04-23 NOTE — Telephone Encounter (Signed)
Do you want [place] or PCP to send referral for bilateral ABI'S

## 2019-04-24 NOTE — Telephone Encounter (Signed)
I put referral in for ABI study for bilateral lower extremity.

## 2019-04-27 ENCOUNTER — Other Ambulatory Visit: Payer: Self-pay

## 2019-04-27 NOTE — Telephone Encounter (Signed)
ABI was ordered in different encounter. Needs visit with EKG for surgical clearance.

## 2019-04-27 NOTE — Telephone Encounter (Signed)
Placed forms in your basket was not sure if you wanted to put referral in epic or not

## 2019-04-27 NOTE — Telephone Encounter (Signed)
Can you schedule patient for a surgical clearance visit to get EKG. Thanks

## 2019-04-27 NOTE — Telephone Encounter (Signed)
Pt scheduled  

## 2019-04-28 ENCOUNTER — Other Ambulatory Visit (HOSPITAL_COMMUNITY): Payer: Self-pay | Admitting: Internal Medicine

## 2019-04-28 ENCOUNTER — Ambulatory Visit (INDEPENDENT_AMBULATORY_CARE_PROVIDER_SITE_OTHER): Payer: Medicare Other | Admitting: Internal Medicine

## 2019-04-28 ENCOUNTER — Ambulatory Visit (HOSPITAL_COMMUNITY)
Admission: RE | Admit: 2019-04-28 | Discharge: 2019-04-28 | Disposition: A | Discharge status: Home / Self Care | Payer: Medicare Other | Source: Ambulatory Visit | Attending: Cardiovascular Disease | Admitting: Cardiovascular Disease

## 2019-04-28 ENCOUNTER — Encounter: Payer: Self-pay | Admitting: Internal Medicine

## 2019-04-28 ENCOUNTER — Other Ambulatory Visit: Payer: Self-pay

## 2019-04-28 VITALS — BP 110/60 | HR 81 | Temp 98.0°F | Ht 72.0 in | Wt 201.0 lb

## 2019-04-28 DIAGNOSIS — M1712 Unilateral primary osteoarthritis, left knee: Secondary | ICD-10-CM

## 2019-04-28 DIAGNOSIS — R0989 Other specified symptoms and signs involving the circulatory and respiratory systems: Secondary | ICD-10-CM

## 2019-04-28 DIAGNOSIS — Z01818 Encounter for other preprocedural examination: Secondary | ICD-10-CM | POA: Insufficient documentation

## 2019-04-28 DIAGNOSIS — I10 Essential (primary) hypertension: Secondary | ICD-10-CM | POA: Diagnosis not present

## 2019-04-28 DIAGNOSIS — M79605 Pain in left leg: Secondary | ICD-10-CM | POA: Diagnosis not present

## 2019-04-28 NOTE — Progress Notes (Signed)
   Subjective:   Patient ID: [name], male    DOB: October 07, 1931, 85 y.o.   MRN: [id number]  HPI The patient is an 85 YO man coming in for surgical clearance for left knee surgery. He is limited by pain. Denies chest pains or SOB. 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 He is getting surgery soon and also getting ABI later today due to poor pedal pulses on exam with orthopedic. Denies new abdominal pain or diarrhea or constipation. He is exerting quite a lot although no formal exercise due to pain. Overall feels in pain quite a lot and is hopeful that this can help his QOL.   PMH, [facility], social history reviewed and updated  Review of Systems  Constitutional: Positive for activity change.  HENT: Negative.   Eyes: Negative.   Respiratory: Negative for cough, chest tightness and shortness of breath.   Cardiovascular: Negative for chest pain, palpitations and leg swelling.  Gastrointestinal: Negative for abdominal distention, abdominal pain, constipation, diarrhea, nausea and vomiting.  Musculoskeletal: Positive for arthralgias and myalgias.  Skin: Negative.   Neurological: Negative.   Psychiatric/Behavioral: Negative.     Objective:  Physical Exam Constitutional:      Appearance: He is well-developed.  HENT:     Head: Normocephalic and atraumatic.  Neck:     Musculoskeletal: Normal range of motion.  Cardiovascular:     Rate and Rhythm: Normal rate and regular rhythm.  Pulmonary:     Effort: Pulmonary effort is normal. No respiratory distress.     Breath sounds: Normal breath sounds. No wheezing or rales.  Abdominal:     General: Bowel sounds are normal. There is no distension.     Palpations: Abdomen is soft.     Tenderness: There is no abdominal tenderness. There is no rebound.  Musculoskeletal:        General: Tenderness present.  Skin:    General: Skin is warm and dry.  Neurological:     Mental Status: He is alert and oriented to person, place, and time.   Coordination: Coordination normal.     Vitals:   04/28/19 0803  BP: 110/60  Pulse: 81  Temp: 98 F (36.7 C)  TempSrc: Oral  SpO2: 95%  Weight: 201 lb (91.2 kg)  Height: 6' (1.829 m)   EKG: Rate 75, sinus, axis normal, interval normal, no st or t wave changes, few pvcs, no significant change from Dec 2019  Assessment & Plan:

## 2019-04-28 NOTE — Assessment & Plan Note (Signed)
BP at goal on amlodipine and coreg and lasix prn and hydralazine.

## 2019-04-28 NOTE — Patient Instructions (Signed)
You are cleared for the knee surgery without problems.

## 2019-04-28 NOTE — Assessment & Plan Note (Signed)
EKG done and without changes today. Recent labs acceptable and cv risk acceptable. Counseled about perioperative management and making sure to take his blood pressure medications. Cleared.

## 2019-04-28 NOTE — Assessment & Plan Note (Signed)
Getting ABI later today, scheduled for surgery. Low risk CV and okay to proceed. EKG done today without changes. Discussed post op management of pain.

## 2019-04-29 NOTE — Progress Notes (Addendum)
[id number] [address], [place] [address] PhoneSE:[phone] FaxXO:[phone]    Your procedure is scheduled on Thursday, September 24th.  Report to [facility] Main Entrance "A" at 5:30 A.M., and check in at the Admitting office.  Call this number if you have problems the morning of surgery:  [phone]  Call [phone] if you have any questions prior to your surgery date Monday-Friday 8am-4pm   Remember:  Do not eat after midnight the night before your surgery  You may drink clear liquids until 4:30 A.M. the morning of your surgery.   Clear liquids allowed are: Water, Non-Citrus Juices (without pulp), Carbonated Beverages, Clear Tea, Black Coffee Only, and Gatorade   Please complete your PRE-SURGERY ENSURE that was provided to you by 4:30 A.M. the morning of surgery.  Please, if able, drink it in one setting. DO NOT SIP.   Take these medicines the morning of surgery with A SIP OF WATER  amLODipine (NORVASC)  carvedilol (COREG) hydrALAZINE (APRESOLINE)  predniSONE (DELTASONE) tamsulosin (FLOMAX)   If needed - fluticasone (FLONASE)/nasal spray, Oxycodone HCl,     7 days prior to surgery STOP taking any Aspirin (unless otherwise instructed by your surgeon), Aleve, Naproxen, Ibuprofen, Motrin, Advil, Goody's, BC's, all herbal medications, fish oil, and all vitamins.   The Morning of Surgery  Do not wear jewelry, make-up or nail polish.  Do not wear lotions, powders, or perfumes/colognes, or deodorant  Do not shave 48 hours prior to surgery.  Men may shave face and neck.  Do not bring valuables to the hospital.  [facility] is not responsible for any belongings or valuables.  If you are a smoker, DO NOT Smoke 24 hours prior to surgery IF you wear a CPAP at night please bring your mask, tubing, and machine the morning of surgery   Remember that you must have someone to transport you home after your surgery, and remain  with you for 24 hours if you are discharged the same day.  Contacts, glasses, hearing aids, dentures or bridgework may not be worn into surgery.   Leave your suitcase in the car.  After surgery it may be brought to your room.  For patients admitted to the hospital, discharge time will be determined by your treatment team.  Patients discharged the day of surgery will not be allowed to drive home.   Special instructions:   [facility]- Preparing For Surgery  Before surgery, you can play an important role. Because skin is not sterile, your skin needs to be as free of germs as possible. You can reduce the number of germs on your skin by washing with CHG (chlorahexidine gluconate) Soap before surgery.  CHG is an antiseptic cleaner which kills germs and bonds with the skin to continue killing germs even after washing.    Oral Hygiene is also important to reduce your risk of infection.  Remember - BRUSH YOUR TEETH THE MORNING OF SURGERY WITH YOUR REGULAR TOOTHPASTE  Please do not use if you have an allergy to CHG or antibacterial soaps. If your skin becomes reddened/irritated stop using the CHG.  Do not shave (including legs and underarms) for at least 48 hours prior to first CHG shower. It is OK to shave your face.  Please follow these instructions carefully.   1. Shower the NIGHT BEFORE SURGERY and the MORNING OF SURGERY with CHG Soap.   2. If you chose to wash your hair, wash your hair first  as usual with your normal shampoo.  3. After you shampoo, rinse your hair and body thoroughly to remove the shampoo.  4. Use CHG as you would any other liquid soap. You can apply CHG directly to the skin and wash gently with a scrungie or a clean washcloth.   5. Apply the CHG Soap to your body ONLY FROM THE NECK DOWN.  Do not use on open wounds or open sores. Avoid contact with your eyes, ears, mouth and genitals (private parts). Wash Face and genitals (private parts)  with your normal soap.   6. Wash  thoroughly, paying special attention to the area where your surgery will be performed.  7. Thoroughly rinse your body with warm water from the neck down.  8. DO NOT shower/wash with your normal soap after using and rinsing off the CHG Soap.  9. Pat yourself dry with a CLEAN TOWEL.  10. Wear CLEAN PAJAMAS to bed the night before surgery, wear comfortable clothes the morning of surgery  11. Place CLEAN SHEETS on your bed the night of your first shower and DO NOT SLEEP WITH PETS.  Day of Surgery: Do not apply any deodorants/lotions. Please shower the morning of surgery with the CHG soap  Please wear clean clothes to the hospital/surgery center.   Remember to brush your teeth WITH YOUR REGULAR TOOTHPASTE.  Please read over the following fact sheets that you were given.

## 2019-04-30 ENCOUNTER — Other Ambulatory Visit: Payer: Self-pay

## 2019-04-30 ENCOUNTER — Encounter (HOSPITAL_COMMUNITY): Payer: Self-pay

## 2019-04-30 ENCOUNTER — Encounter (HOSPITAL_COMMUNITY)
Admission: RE | Admit: 2019-04-30 | Discharge: 2019-04-30 | Disposition: A | Discharge status: Home / Self Care | Payer: Medicare Other | Source: Ambulatory Visit | Attending: Orthopedic Surgery | Admitting: Orthopedic Surgery

## 2019-04-30 DIAGNOSIS — Z01812 Encounter for preprocedural laboratory examination: Secondary | ICD-10-CM | POA: Insufficient documentation

## 2019-04-30 LAB — URINALYSIS, ROUTINE W REFLEX MICROSCOPIC
Bilirubin Urine: NEGATIVE
Glucose, UA: NEGATIVE mg/dL
Hgb urine dipstick: NEGATIVE
Ketones, ur: NEGATIVE mg/dL
Leukocytes,Ua: NEGATIVE
Nitrite: NEGATIVE
Protein, ur: NEGATIVE mg/dL
Specific Gravity, Urine: 1.017 (ref 1.005–1.030)
pH: 7 (ref 5.0–8.0)

## 2019-04-30 LAB — BASIC METABOLIC PANEL
Anion gap: 10 (ref 5–15)
BUN: 29 mg/dL — ABNORMAL HIGH (ref 8–23)
CO2: 23 mmol/L (ref 22–32)
Calcium: 9.2 mg/dL (ref 8.9–10.3)
Chloride: 104 mmol/L (ref 98–111)
Creatinine, Ser: 1.26 mg/dL — ABNORMAL HIGH (ref 0.61–1.24)
GFR calc Af Amer: 60 mL/min — ABNORMAL LOW (ref >60)
GFR calc non Af Amer: 52 mL/min — ABNORMAL LOW (ref >60)
Glucose, Bld: 135 mg/dL — ABNORMAL HIGH (ref 70–99)
Potassium: 5.1 mmol/L (ref 3.5–5.1)
Sodium: 137 mmol/L (ref 135–145)

## 2019-04-30 LAB — CBC
HCT: 42.7 % (ref 39.0–52.0)
Hemoglobin: 13.1 g/dL (ref 13.0–17.0)
MCH: 28.9 pg (ref 26.0–34.0)
MCHC: 30.7 g/dL (ref 30.0–36.0)
MCV: 94.3 fL (ref 80.0–100.0)
Platelets: 453 10*3/uL — ABNORMAL HIGH (ref 150–400)
RBC: 4.53 MIL/uL (ref 4.22–5.81)
RDW: 14.3 % (ref 11.5–15.5)
WBC: 10.2 10*3/uL (ref 4.0–10.5)
nRBC: 0 % (ref 0.0–0.2)

## 2019-04-30 LAB — SURGICAL PCR SCREEN
MRSA, PCR: NEGATIVE
Staphylococcus aureus: POSITIVE — AB

## 2019-04-30 NOTE — Progress Notes (Signed)
PCP - Dr. Pricilla Holm Cardiologist - denies  PPM/ICD - N/A Device Orders - N/A  Rep Notified  Chest x-ray - denies EKG - 04/28/2019 Stress Test - denies ECHO - 08/10/18 Cardiac Cath - denies   Sleep Study - denies CPAP -N/A  Blood Thinner Instructions: N/A Aspirin Instructions: N/A  ERAS Protcol - YES PRE-SURGERY Ensure - Given   COVID TEST- Scheduled for Monday 05/04/2019   Anesthesia review: No  Patient denies shortness of breath, fever, cough and chest pain at PAT appointment  Patient verbalized understanding of instructions that were given to them at the PAT appointment. Patient was also instructed that they will need to review over the PAT instructions again at home before surgery.

## 2019-05-01 LAB — URINE CULTURE: Culture: <10000 — AB

## 2019-05-04 ENCOUNTER — Other Ambulatory Visit (HOSPITAL_COMMUNITY)
Admission: RE | Admit: 2019-05-04 | Discharge: 2019-05-04 | Disposition: A | Discharge status: Home / Self Care | Payer: Medicare Other | Source: Ambulatory Visit | Attending: Orthopedic Surgery | Admitting: Orthopedic Surgery

## 2019-05-04 DIAGNOSIS — Z20828 Contact with and (suspected) exposure to other viral communicable diseases: Secondary | ICD-10-CM | POA: Insufficient documentation

## 2019-05-04 DIAGNOSIS — Z01812 Encounter for preprocedural laboratory examination: Secondary | ICD-10-CM | POA: Insufficient documentation

## 2019-05-06 ENCOUNTER — Telehealth: Payer: Self-pay | Admitting: *Deleted

## 2019-05-06 LAB — NOVEL CORONAVIRUS, NAA (HOSP ORDER, SEND-OUT TO REF LAB; TAT 18-24 HRS): SARS-CoV-2, NAA: NOT DETECTED

## 2019-05-06 NOTE — Care Plan (Signed)
RNCM contacted patient to discuss [facility] bundle for his upcoming Left TKA with Dr. Marlou Sa on 05/07/19. Reviewed all questions related to surgery and after care. Patient states he lives with his wife, who is in need of caregivers. He has had a previous knee replacement and feels he will be able to go home with assistance from a nearby relative. A home CPM has been ordered through [facility] and will be delivered to his home per rep. He did indicate that he will need a FWW, but not a BSC/3 in 1. Will order these through vendor to be delivered to hospital prior to discharge. HHPT is anticipated after discharge from hospital. [facility] liaison has been made aware of referral. Choice provided to patient. RNCM will continue to follow for any further CM needs.

## 2019-05-06 NOTE — Telephone Encounter (Signed)
Pre-op Ortho bundle call completed. 

## 2019-05-06 NOTE — Anesthesia Preprocedure Evaluation (Addendum)
Anesthesia Evaluation  Patient identified by MRN, date of birth, ID band Patient awake    Reviewed: Allergy & Precautions, NPO status , Patient's Chart, lab work & pertinent test results  History of Anesthesia Complications Negative for: history of anesthetic complications  Airway Mallampati: II  TM Distance: >3 FB Neck ROM: Full    Dental no notable dental hx. (+) Dental Advisory Given   Pulmonary neg pulmonary ROS, former smoker,    Pulmonary exam normal        Cardiovascular hypertension, Pt. on medications and Pt. on home beta blockers Normal cardiovascular exam  Study Conclusions  - Left ventricle: The cavity size was mildly dilated. Wall   thickness was increased in a pattern of mild LVH. Systolic   function was normal. The estimated ejection fraction was in the   range of 55% to 60%. Doppler parameters are consistent with   abnormal left ventricular relaxation (grade 1 diastolic   dysfunction). - Aortic valve: Valve area (VTI): 3.24 cm^2. Valve area (Vmax):   3.01 cm^2. Valve area (Vmean): 3.18 cm^2. - Mitral valve: There was mild regurgitation. - Left atrium: The atrium was mildly dilated. - Pericardium, extracardiac: A trivial pericardial effusion was   identified.   Neuro/Psych negative neurological ROS     GI/Hepatic negative GI ROS, Neg liver ROS,   Endo/Other  negative endocrine ROS  Renal/GU Renal InsufficiencyRenal disease     Musculoskeletal negative musculoskeletal ROS (+)   Abdominal   Peds  Hematology negative hematology ROS (+)   Anesthesia Other Findings Day of surgery medications reviewed with the patient.  Reproductive/Obstetrics                            Anesthesia Physical Anesthesia Plan  ASA: III  Anesthesia Plan: Spinal   Post-op Pain Management:  Regional for Post-op pain   Induction:   PONV Risk Score and Plan: 2 and Ondansetron and Propofol  infusion  Airway Management Planned: Natural Airway  Additional Equipment:   Intra-op Plan:   Post-operative Plan: Extubation in OR  Informed Consent: I have reviewed the patients History and Physical, chart, labs and discussed the procedure including the risks, benefits and alternatives for the proposed anesthesia with the patient or authorized representative who has indicated his/her understanding and acceptance.     Dental advisory given  Plan Discussed with: CRNA and Anesthesiologist  Anesthesia Plan Comments:        Anesthesia Quick Evaluation

## 2019-05-07 ENCOUNTER — Other Ambulatory Visit: Payer: Self-pay

## 2019-05-07 ENCOUNTER — Encounter (HOSPITAL_COMMUNITY)
Admission: RE | Disposition: A | Discharge status: Home Health | Payer: Self-pay | Source: Home / Self Care | Attending: Orthopedic Surgery

## 2019-05-07 ENCOUNTER — Ambulatory Visit (HOSPITAL_COMMUNITY): Payer: Medicare Other | Admitting: Anesthesiology

## 2019-05-07 ENCOUNTER — Inpatient Hospital Stay (HOSPITAL_COMMUNITY)
Admission: RE | Admit: 2019-05-07 | Discharge: 2019-05-11 | DRG: 470 | Disposition: A | Discharge status: Home Health | Payer: Medicare Other | Attending: Orthopedic Surgery | Admitting: Orthopedic Surgery

## 2019-05-07 ENCOUNTER — Encounter (HOSPITAL_COMMUNITY): Payer: Self-pay

## 2019-05-07 DIAGNOSIS — I5032 Chronic diastolic (congestive) heart failure: Secondary | ICD-10-CM | POA: Diagnosis not present

## 2019-05-07 DIAGNOSIS — I110 Hypertensive heart disease with heart failure: Secondary | ICD-10-CM | POA: Diagnosis not present

## 2019-05-07 DIAGNOSIS — Z823 Family history of stroke: Secondary | ICD-10-CM

## 2019-05-07 DIAGNOSIS — Z981 Arthrodesis status: Secondary | ICD-10-CM

## 2019-05-07 DIAGNOSIS — Z87891 Personal history of nicotine dependence: Secondary | ICD-10-CM | POA: Diagnosis not present

## 2019-05-07 DIAGNOSIS — N400 Enlarged prostate without lower urinary tract symptoms: Secondary | ICD-10-CM | POA: Diagnosis not present

## 2019-05-07 DIAGNOSIS — M1712 Unilateral primary osteoarthritis, left knee: Secondary | ICD-10-CM | POA: Diagnosis not present

## 2019-05-07 DIAGNOSIS — I5031 Acute diastolic (congestive) heart failure: Secondary | ICD-10-CM | POA: Diagnosis not present

## 2019-05-07 DIAGNOSIS — Z96651 Presence of right artificial knee joint: Secondary | ICD-10-CM | POA: Diagnosis not present

## 2019-05-07 DIAGNOSIS — Z8249 Family history of ischemic heart disease and other diseases of the circulatory system: Secondary | ICD-10-CM

## 2019-05-07 DIAGNOSIS — J309 Allergic rhinitis, unspecified: Secondary | ICD-10-CM | POA: Diagnosis not present

## 2019-05-07 DIAGNOSIS — G8918 Other acute postprocedural pain: Secondary | ICD-10-CM | POA: Diagnosis not present

## 2019-05-07 DIAGNOSIS — E669 Obesity, unspecified: Secondary | ICD-10-CM | POA: Diagnosis present

## 2019-05-07 HISTORY — PX: TOTAL KNEE ARTHROPLASTY: SHX125

## 2019-05-07 SURGERY — ARTHROPLASTY, KNEE, TOTAL
Anesthesia: Spinal | Site: Knee | Laterality: Left

## 2019-05-07 MED ORDER — ONDANSETRON HCL 4 MG/2ML IJ SOLN
INTRAMUSCULAR | Status: DC | PRN
  Administered 2019-05-07: 4 mg via INTRAVENOUS

## 2019-05-07 MED ORDER — CARVEDILOL 12.5 MG PO TABS
25.0000 mg | ORAL_TABLET | Freq: Once | ORAL | Status: AC
  Administered 2019-05-07: 25 mg via ORAL

## 2019-05-07 MED ORDER — NAPROXEN 250 MG PO TABS
250.0000 mg | ORAL_TABLET | Freq: Once | ORAL | Status: AC
  Administered 2019-05-07: 14:00:00 250 mg via ORAL
  Filled 2019-05-07 (×2): qty 1

## 2019-05-07 MED ORDER — ONDANSETRON HCL 4 MG/2ML IJ SOLN: 4.0000 mg | Freq: Four times a day (QID) | INTRAMUSCULAR | Status: DC | PRN

## 2019-05-07 MED ORDER — CEFAZOLIN SODIUM-DEXTROSE 2-4 GM/100ML-% IV SOLN
INTRAVENOUS | Status: AC
  Filled 2019-05-07: qty 100

## 2019-05-07 MED ORDER — METHOCARBAMOL 500 MG PO TABS
500.0000 mg | ORAL_TABLET | Freq: Four times a day (QID) | ORAL | Status: DC | PRN
  Administered 2019-05-07 – 2019-05-11 (×9): 500 mg via ORAL
  Filled 2019-05-07 (×9): qty 1

## 2019-05-07 MED ORDER — MENTHOL 3 MG MT LOZG: 1.0000 | LOZENGE | OROMUCOSAL | Status: DC | PRN

## 2019-05-07 MED ORDER — CEFAZOLIN SODIUM-DEXTROSE 2-4 GM/100ML-% IV SOLN
2.0000 g | Freq: Four times a day (QID) | INTRAVENOUS | Status: AC
  Administered 2019-05-07 (×2): 2 g via INTRAVENOUS
  Filled 2019-05-07 (×2): qty 100

## 2019-05-07 MED ORDER — DOCUSATE SODIUM 100 MG PO CAPS
100.0000 mg | ORAL_CAPSULE | Freq: Two times a day (BID) | ORAL | Status: DC
  Administered 2019-05-07 – 2019-05-11 (×8): 100 mg via ORAL
  Filled 2019-05-07 (×8): qty 1

## 2019-05-07 MED ORDER — SODIUM CHLORIDE 0.9 % IR SOLN
Status: DC | PRN
  Administered 2019-05-07: 3000 mL

## 2019-05-07 MED ORDER — FENTANYL CITRATE (PF) 250 MCG/5ML IJ SOLN
INTRAMUSCULAR | Status: AC
  Filled 2019-05-07: qty 5

## 2019-05-07 MED ORDER — CLONIDINE HCL (ANALGESIA) 100 MCG/ML EP SOLN
EPIDURAL | Status: DC | PRN
  Administered 2019-05-07: 1 mL

## 2019-05-07 MED ORDER — FENTANYL CITRATE (PF) 100 MCG/2ML IJ SOLN
25.0000 ug | INTRAMUSCULAR | Status: DC | PRN
  Administered 2019-05-07 (×2): 50 ug via INTRAVENOUS

## 2019-05-07 MED ORDER — PHENOL 1.4 % MT LIQD: 1.0000 | OROMUCOSAL | Status: DC | PRN

## 2019-05-07 MED ORDER — POVIDONE-IODINE 10 % EX SWAB: 2.0000 "application " | Freq: Once | CUTANEOUS | Status: DC

## 2019-05-07 MED ORDER — FENTANYL CITRATE (PF) 250 MCG/5ML IJ SOLN
INTRAMUSCULAR | Status: DC | PRN
  Administered 2019-05-07: 25 ug via INTRAVENOUS
  Administered 2019-05-07: 50 ug via INTRAVENOUS

## 2019-05-07 MED ORDER — ACETAMINOPHEN 500 MG PO TABS
ORAL_TABLET | ORAL | Status: AC
  Administered 2019-05-07: 1000 mg via ORAL
  Filled 2019-05-07: qty 2

## 2019-05-07 MED ORDER — TRANEXAMIC ACID 1000 MG/10ML IV SOLN
2000.0000 mg | Freq: Once | INTRAVENOUS | Status: DC
  Filled 2019-05-07: qty 20

## 2019-05-07 MED ORDER — TRANEXAMIC ACID-NACL 1000-0.7 MG/100ML-% IV SOLN
INTRAVENOUS | Status: AC
  Filled 2019-05-07: qty 100

## 2019-05-07 MED ORDER — HYDROMORPHONE HCL 1 MG/ML IJ SOLN
0.5000 mg | INTRAMUSCULAR | Status: DC | PRN
  Administered 2019-05-07 – 2019-05-11 (×6): 0.5 mg via INTRAVENOUS
  Filled 2019-05-07 (×5): qty 1

## 2019-05-07 MED ORDER — FENTANYL CITRATE (PF) 100 MCG/2ML IJ SOLN
INTRAMUSCULAR | Status: AC
  Filled 2019-05-07: qty 2

## 2019-05-07 MED ORDER — BUPIVACAINE-EPINEPHRINE (PF) 0.5% -1:200000 IJ SOLN
INTRAMUSCULAR | Status: DC | PRN
  Administered 2019-05-07: 30 mL

## 2019-05-07 MED ORDER — OXYCODONE HCL 5 MG PO TABS
10.0000 mg | ORAL_TABLET | ORAL | Status: DC | PRN
  Administered 2019-05-07 – 2019-05-11 (×14): 10 mg via ORAL
  Filled 2019-05-07 (×14): qty 2

## 2019-05-07 MED ORDER — PROPOFOL 1000 MG/100ML IV EMUL
INTRAVENOUS | Status: AC
  Filled 2019-05-07: qty 100

## 2019-05-07 MED ORDER — PROPOFOL 500 MG/50ML IV EMUL
INTRAVENOUS | Status: DC | PRN
  Administered 2019-05-07: 75 ug/kg/min via INTRAVENOUS

## 2019-05-07 MED ORDER — MORPHINE SULFATE (PF) 4 MG/ML IV SOLN
INTRAVENOUS | Status: DC | PRN
  Administered 2019-05-07: 4 mg via INTRAMUSCULAR

## 2019-05-07 MED ORDER — CEFAZOLIN SODIUM-DEXTROSE 2-4 GM/100ML-% IV SOLN
2.0000 g | INTRAVENOUS | Status: AC
  Administered 2019-05-07: 2 g via INTRAVENOUS

## 2019-05-07 MED ORDER — TRANEXAMIC ACID-NACL 1000-0.7 MG/100ML-% IV SOLN
1000.0000 mg | INTRAVENOUS | Status: AC
  Administered 2019-05-07: 1000 mg via INTRAVENOUS

## 2019-05-07 MED ORDER — BUPIVACAINE-EPINEPHRINE 0.5% -1:200000 IJ SOLN
INTRAMUSCULAR | Status: AC
  Filled 2019-05-07: qty 1

## 2019-05-07 MED ORDER — SODIUM CHLORIDE 0.9% FLUSH
INTRAVENOUS | Status: DC | PRN
  Administered 2019-05-07: 20 mL

## 2019-05-07 MED ORDER — ACETAMINOPHEN 500 MG PO TABS
1000.0000 mg | ORAL_TABLET | Freq: Once | ORAL | Status: AC
  Administered 2019-05-07: 06:00:00 1000 mg via ORAL

## 2019-05-07 MED ORDER — CHLORHEXIDINE GLUCONATE 4 % EX LIQD: 60.0000 mL | Freq: Once | CUTANEOUS | Status: DC

## 2019-05-07 MED ORDER — BUPIVACAINE LIPOSOME 1.3 % IJ SUSP
INTRAMUSCULAR | Status: DC | PRN
  Administered 2019-05-07: 20 mL

## 2019-05-07 MED ORDER — SODIUM CHLORIDE 0.9 % IV SOLN
INTRAVENOUS | Status: DC | PRN
  Administered 2019-05-07: 20 ug/min via INTRAVENOUS

## 2019-05-07 MED ORDER — BUPIVACAINE LIPOSOME 1.3 % IJ SUSP
20.0000 mL | Freq: Once | INTRAMUSCULAR | Status: DC
  Filled 2019-05-07: qty 20

## 2019-05-07 MED ORDER — MORPHINE SULFATE (PF) 4 MG/ML IV SOLN
INTRAVENOUS | Status: AC
  Filled 2019-05-07: qty 2

## 2019-05-07 MED ORDER — METOCLOPRAMIDE HCL 5 MG PO TABS: 5.0000 mg | ORAL_TABLET | Freq: Three times a day (TID) | ORAL | Status: DC | PRN

## 2019-05-07 MED ORDER — PROMETHAZINE HCL 25 MG/ML IJ SOLN: 6.2500 mg | INTRAMUSCULAR | Status: DC | PRN

## 2019-05-07 MED ORDER — CELECOXIB 200 MG PO CAPS
ORAL_CAPSULE | ORAL | Status: AC
  Administered 2019-05-07: 06:00:00 400 mg via ORAL
  Filled 2019-05-07: qty 2

## 2019-05-07 MED ORDER — MIDAZOLAM HCL 2 MG/2ML IJ SOLN
INTRAMUSCULAR | Status: AC
  Filled 2019-05-07: qty 2

## 2019-05-07 MED ORDER — CLONIDINE HCL (ANALGESIA) 100 MCG/ML EP SOLN
EPIDURAL | Status: AC
  Filled 2019-05-07: qty 10

## 2019-05-07 MED ORDER — CARVEDILOL 12.5 MG PO TABS
ORAL_TABLET | ORAL | Status: AC
  Filled 2019-05-07: qty 2

## 2019-05-07 MED ORDER — PROPOFOL 10 MG/ML IV BOLUS
INTRAVENOUS | Status: AC
  Filled 2019-05-07: qty 20

## 2019-05-07 MED ORDER — BUPIVACAINE IN DEXTROSE 0.75-8.25 % IT SOLN
INTRATHECAL | Status: DC | PRN
  Administered 2019-05-07: 15 mg via INTRATHECAL

## 2019-05-07 MED ORDER — PROPOFOL 1000 MG/100ML IV EMUL
INTRAVENOUS | Status: AC
  Filled 2019-05-07: qty 200

## 2019-05-07 MED ORDER — 0.9 % SODIUM CHLORIDE (POUR BTL) OPTIME
TOPICAL | Status: DC | PRN
  Administered 2019-05-07: 1000 mL

## 2019-05-07 MED ORDER — BUPIVACAINE-EPINEPHRINE 0.5% -1:200000 IJ SOLN
INTRAMUSCULAR | Status: DC | PRN
  Administered 2019-05-07: 50 mL

## 2019-05-07 MED ORDER — HYDROMORPHONE HCL 1 MG/ML IJ SOLN
INTRAMUSCULAR | Status: AC
  Filled 2019-05-07: qty 1

## 2019-05-07 MED ORDER — METOCLOPRAMIDE HCL 5 MG/ML IJ SOLN: 5.0000 mg | Freq: Three times a day (TID) | INTRAMUSCULAR | Status: DC | PRN

## 2019-05-07 MED ORDER — CELECOXIB 200 MG PO CAPS
400.0000 mg | ORAL_CAPSULE | Freq: Once | ORAL | Status: AC
  Administered 2019-05-07: 06:00:00 400 mg via ORAL

## 2019-05-07 MED ORDER — MIDAZOLAM HCL 2 MG/2ML IJ SOLN
INTRAMUSCULAR | Status: DC | PRN
  Administered 2019-05-07 (×2): 1 mg via INTRAVENOUS

## 2019-05-07 MED ORDER — TRANEXAMIC ACID 1000 MG/10ML IV SOLN
INTRAVENOUS | Status: DC | PRN
  Administered 2019-05-07: 2000 mg via TOPICAL

## 2019-05-07 MED ORDER — ASPIRIN 81 MG PO CHEW
81.0000 mg | CHEWABLE_TABLET | Freq: Two times a day (BID) | ORAL | Status: DC
  Administered 2019-05-07 – 2019-05-11 (×8): 81 mg via ORAL
  Filled 2019-05-07 (×8): qty 1

## 2019-05-07 MED ORDER — LACTATED RINGERS IV SOLN
INTRAVENOUS | Status: DC
  Administered 2019-05-07: 13:00:00 via INTRAVENOUS

## 2019-05-07 MED ORDER — ACETAMINOPHEN 325 MG PO TABS
325.0000 mg | ORAL_TABLET | Freq: Four times a day (QID) | ORAL | Status: DC | PRN
  Administered 2019-05-09 – 2019-05-10 (×2): 650 mg via ORAL
  Filled 2019-05-07 (×2): qty 2

## 2019-05-07 MED ORDER — METHOCARBAMOL 1000 MG/10ML IJ SOLN
500.0000 mg | Freq: Four times a day (QID) | INTRAVENOUS | Status: DC | PRN
  Filled 2019-05-07: qty 5

## 2019-05-07 MED ORDER — ONDANSETRON HCL 4 MG PO TABS: 4.0000 mg | ORAL_TABLET | Freq: Four times a day (QID) | ORAL | Status: DC | PRN

## 2019-05-07 SURGICAL SUPPLY — 72 items
BAG DECANTER FOR FLEXI CONT (MISCELLANEOUS) ×3 IMPLANT
BANDAGE ESMARK 6X9 LF (GAUZE/BANDAGES/DRESSINGS) ×1 IMPLANT
BLADE SAG 18X100X1.27 (BLADE) ×3 IMPLANT
BNDG COHESIVE 6X5 TAN STRL LF (GAUZE/BANDAGES/DRESSINGS) ×3 IMPLANT
BNDG ELASTIC 6X15 VLCR STRL LF (GAUZE/BANDAGES/DRESSINGS) ×3 IMPLANT
BNDG ESMARK 6X9 LF (GAUZE/BANDAGES/DRESSINGS) ×3
BOWL SMART MIX CTS (DISPOSABLE) IMPLANT
CLOSURE WOUND 1/2 X4 (GAUZE/BANDAGES/DRESSINGS) ×2
COMPONENT TRI CR RETAIN SZ6 LT (Orthopedic Implant) ×1 IMPLANT
CONT SPEC 4OZ CLIKSEAL STRL BL (MISCELLANEOUS) ×3 IMPLANT
COVER SURGICAL LIGHT HANDLE (MISCELLANEOUS) ×3 IMPLANT
COVER WAND RF STERILE (DRAPES) ×3 IMPLANT
CUFF TOURN SGL QUICK 34 (TOURNIQUET CUFF) ×2
CUFF TRNQT CYL 34X4.125X (TOURNIQUET CUFF) ×1 IMPLANT
DECANTER SPIKE VIAL GLASS SM (MISCELLANEOUS) ×3 IMPLANT
DRAPE ORTHO SPLIT 77X108 STRL (DRAPES) ×6
DRAPE SURG ORHT 6 SPLT 77X108 (DRAPES) ×3 IMPLANT
DRAPE U-SHAPE 47X51 STRL (DRAPES) ×3 IMPLANT
DRSG AQUACEL AG ADV 3.5X10 (GAUZE/BANDAGES/DRESSINGS) ×3 IMPLANT
DRSG AQUACEL AG ADV 3.5X14 (GAUZE/BANDAGES/DRESSINGS) IMPLANT
DURAPREP 26ML APPLICATOR (WOUND CARE) ×6 IMPLANT
ELECT CAUTERY BLADE 6.4 (BLADE) ×3 IMPLANT
ELECT REM PT RETURN 9FT ADLT (ELECTROSURGICAL) ×3
ELECTRODE REM PT RTRN 9FT ADLT (ELECTROSURGICAL) ×1 IMPLANT
GAUZE SPONGE 4X4 12PLY STRL (GAUZE/BANDAGES/DRESSINGS) ×3 IMPLANT
GLOVE BIOGEL PI IND STRL 7.5 (GLOVE) ×1 IMPLANT
GLOVE BIOGEL PI IND STRL 8 (GLOVE) ×1 IMPLANT
GLOVE BIOGEL PI INDICATOR 7.5 (GLOVE) ×2
GLOVE BIOGEL PI INDICATOR 8 (GLOVE) ×2
GLOVE ECLIPSE 7.0 STRL STRAW (GLOVE) ×3 IMPLANT
GLOVE SURG ORTHO 8.0 STRL STRW (GLOVE) ×3 IMPLANT
GOWN STRL REUS W/ TWL LRG LVL3 (GOWN DISPOSABLE) ×3 IMPLANT
GOWN STRL REUS W/TWL LRG LVL3 (GOWN DISPOSABLE) ×6
HANDPIECE INTERPULSE COAX TIP (DISPOSABLE) ×2
HOOD PEEL AWAY FLYTE STAYCOOL (MISCELLANEOUS) ×9 IMPLANT
IMMOBILIZER KNEE 22 UNIV (SOFTGOODS) ×3 IMPLANT
INSERT TRIATHLON CS TIB X3 13 (Joint) ×3 IMPLANT
JET LAVAGE IRRISEPT WOUND (IRRIGATION / IRRIGATOR) ×3
KIT BASIN OR (CUSTOM PROCEDURE TRAY) ×3 IMPLANT
KIT TURNOVER KIT B (KITS) ×3 IMPLANT
KNEE TIBIAL COMPONENT SZ7 (Knees) ×3 IMPLANT
LAVAGE JET IRRISEPT WOUND (IRRIGATION / IRRIGATOR) ×1 IMPLANT
MANIFOLD NEPTUNE II (INSTRUMENTS) ×3 IMPLANT
NEEDLE 22X1 1/2 (OR ONLY) (NEEDLE) ×6 IMPLANT
NEEDLE SPNL 18GX3.5 QUINCKE PK (NEEDLE) ×3 IMPLANT
NS IRRIG 1000ML POUR BTL (IV SOLUTION) ×6 IMPLANT
PACK TOTAL JOINT (CUSTOM PROCEDURE TRAY) ×3 IMPLANT
PAD ARMBOARD 7.5X6 YLW CONV (MISCELLANEOUS) ×6 IMPLANT
PAD CAST 4YDX4 CTTN HI CHSV (CAST SUPPLIES) ×1 IMPLANT
PADDING CAST COTTON 4X4 STRL (CAST SUPPLIES) ×2
PADDING CAST COTTON 6X4 STRL (CAST SUPPLIES) ×3 IMPLANT
PATELLA ASYMMETRIC 38X11 KNEE (Orthopedic Implant) ×3 IMPLANT
PIN FLUTED HEDLESS FIX 3.5X1/8 (PIN) ×3 IMPLANT
SET HNDPC FAN SPRY TIP SCT (DISPOSABLE) ×1 IMPLANT
STRIP CLOSURE SKIN 1/2X4 (GAUZE/BANDAGES/DRESSINGS) ×4 IMPLANT
SUCTION FRAZIER HANDLE 10FR (MISCELLANEOUS) ×2
SUCTION TUBE FRAZIER 10FR DISP (MISCELLANEOUS) ×1 IMPLANT
SUT MNCRL AB 3-0 PS2 18 (SUTURE) ×3 IMPLANT
SUT VIC AB 0 CT1 27 (SUTURE) ×6
SUT VIC AB 0 CT1 27XBRD ANBCTR (SUTURE) ×3 IMPLANT
SUT VIC AB 1 CT1 27 (SUTURE) ×10
SUT VIC AB 1 CT1 27XBRD ANBCTR (SUTURE) ×5 IMPLANT
SUT VIC AB 1 CT1 36 (SUTURE) ×9 IMPLANT
SUT VIC AB 2-0 CT1 27 (SUTURE) ×8
SUT VIC AB 2-0 CT1 TAPERPNT 27 (SUTURE) ×4 IMPLANT
SYR 30ML LL (SYRINGE) ×9 IMPLANT
SYR TB 1ML LUER SLIP (SYRINGE) ×3 IMPLANT
TOWEL GREEN STERILE (TOWEL DISPOSABLE) ×6 IMPLANT
TOWEL GREEN STERILE FF (TOWEL DISPOSABLE) ×6 IMPLANT
TRAY CATH 16FR W/PLASTIC CATH (SET/KITS/TRAYS/PACK) IMPLANT
TRIATH CRUCIATE RETAIN SZ6 KNE (Orthopedic Implant) ×3 IMPLANT
WATER STERILE IRR 1000ML POUR (IV SOLUTION) IMPLANT

## 2019-05-07 NOTE — Transfer of Care (Signed)
Immediate Anesthesia Transfer of Care Note  Patient: [name]  Procedure(s) Performed: LEFT TOTAL KNEE ARTHROPLASTY (Left Knee)  Patient Location: PACU  Anesthesia Type:MAC combined with regional for post-op pain  Level of Consciousness: awake, alert , oriented and patient cooperative  Airway & Oxygen Therapy: Patient Spontanous Breathing  Post-op Assessment: Report given to RN and Post -op Vital signs reviewed and stable  Post vital signs: Reviewed and stable  Last Vitals:  Vitals Value Taken Time  BP 114/58 05/07/19 1025  Temp 36.6 C 05/07/19 1025  Pulse 67 05/07/19 1026  Resp 24 05/07/19 1026  SpO2 93 % 05/07/19 1026  Vitals shown include unvalidated device data.  Last Pain:  Vitals:   05/07/19 0603  TempSrc:   PainSc: 0-No pain         Complications: No apparent anesthesia complications

## 2019-05-07 NOTE — Plan of Care (Signed)

## 2019-05-07 NOTE — Op Note (Signed)
NAME: [name], [name] MEDICAL RECORD [id number] ACCOUNT [id number] DATE OF BIRTH:1931/11/01 FACILITY: [facility] LOCATION: [facility] PHYSICIAN:Oluwatamilore Starnes Randel Pigg, MD  OPERATIVE REPORT  DATE OF PROCEDURE:  05/07/2019  PREOPERATIVE DIAGNOSIS:  Left knee arthritis.  POSTOPERATIVE DIAGNOSIS:  Left knee arthritis.  PROCEDURE:  Left total knee replacement using [employer]ker components press-fit 6, femur 7, tibia press-fit 13 mm polyethylene insert with 38 mm 3-peg press-fit patella cruciate retaining.  SURGEON:  [name], MD  ASSISTANT:  Annie Main, PA-C   INDICATIONS:  The patient is an 85 year old patient with end-stage left knee arthritis who presents for operative management after explanation of risks and benefits.  PROCEDURE IN DETAIL:  The patient was brought to the operating room where spinal anesthetic was induced.  Preoperative antibiotics were administered.  Timeout was called.  The left knee was pre-scrubbed with alcohol and Betadine and allowed to air dry,  prepped with DuraPrep solution and draped in a sterile manner.  [name] was used to cover the operative field.  Time-out was called.  Leg was elevated and exsanguinated with the Esmarch wrap.  Tourniquet time 64 minutes at 300 mmHg.  A median parapatellar  approach to the knee was made.  Skin and subcutaneous tissue were sharply divided.  The parapatellar arthrotomy was marked with #1 Vicryl suture.  Soft tissue removed from the anterior distal femur.  Lateral patellofemoral ligament was released.  Fat pad  was partially excised, and minimal soft tissue dissection was performed on the medial side.  ACL was released and the anterior horn of the lateral meniscus was released.  Intramedullary alignment was then utilized to make a cut on the tibia  approximately 9 mm off the least affected lateral tibial plateau.  The cut was then later revised to get under some sclerotic bone medially.  That was only revised 2 more millimeters.  Bone  quality was excellent.    Attention was directed towards the femur.  An 8 mm cut was made off the distal femur, cut in 5 degrees of valgus.  Spacer block placed and 11 and 13 spacer block gave excellent extension and good stability and good alignment.  The femur was then sized to  a size 6.  Anterior, posterior, and chamfer cuts were made.  The tibia was then keel punched, and final preparation was made there.  The trial components were placed.  The patella was cut down from 24 to 12 and a 38.3 PEG trial patella was placed.  The  patient had full extension, full flexion with no lift off, very good patellar tracking.  Good stability to varus and valgus stress at 0, 30, and 90 degrees.  At this time, trial components were removed.  Thorough irrigation with 3 L of irrigating  solution performed.  Exparel and Marcaine solution injected into the capsule.  Tranexamic acid sponge was used to sit for 3 minutes within the knee joint itself.  At this time, that was removed.  Components were tapped into position with excellent press  fit and excellent fit obtained.  The patient did require a small lateral release to optimize patellar tracking, which was excellent with no-thumbs technique and a full range of motion.  Tourniquet released at this time.  Bleeding points encountered,  controlled using electrocautery.  Pouring irrigation was then utilized along with Irrisept, which was utilized at all times during the case to diminish infection.  Arthrotomy was closed over a bolster using #1 Vicryl suture, followed by interrupted  inverted 0 Vicryl suture, 2-0  Vicryl, suture and a 3-0 Monocryl.  Solution of Marcaine, morphine, clonidine injected into the knee at the end of the case for postop pain relief.  The patient tolerated the procedure well without immediate complications.   Transferred to the recovery room in stable condition.  Steri-Strips, Aquacel dressing, and knee immobilizer were placed.  Luke's assistance was  required at all times during the case for retraction, opening and closing.  His assistance was a medical  necessity.  LN/NUANCE  D:05/07/2019 T:05/07/2019 JOB:[id number]

## 2019-05-07 NOTE — H&P (Signed)
TOTAL KNEE ADMISSION H&P  Patient is being admitted for left total knee arthroplasty.  Subjective:  Chief Complaint:left knee pain.  HPI: [name], 85 y.o. male, has a history of pain and functional disability in the left knee due to arthritis and has failed non-surgical conservative treatments for greater than 12 weeks to includeNSAID's and/or analgesics, corticosteriod injections, viscosupplementation injections, use of assistive devices and activity modification.  Onset of symptoms was gradual, starting >10 years ago with rapidlly worsening course since that time. The patient noted no past surgery on the left knee(s).  Patient currently rates pain in the left knee(s) at 10 out of 10 with activity. Patient has night pain, worsening of pain with activity and weight bearing, pain that interferes with activities of daily living, pain with passive range of motion, crepitus and joint swelling.  Patient has evidence of subchondral sclerosis and joint space narrowing by imaging studies. This patient has had A good result with his right total knee replacement done 8 years ago.  However he is on chronic pain medicine at this time due to back surgery.  This will make his recovery more difficult.. There is no active infection.  Patient Active Problem List   Diagnosis Date Noted  . Pre-operative clearance 04/28/2019  . Acute diastolic CHF (congestive heart failure) ([place]) 08/10/2018  . Leg wound, left 03/19/2018  . Right arm pain 11/11/2017  . Primary osteoarthritis of right foot 08/21/2017  . Syncope 02/27/2017  . Arthritis of right ankle 12/18/2016  . Carpal tunnel syndrome, bilateral 09/04/2016  . Leg swelling 08/16/2016  . Hand weakness 07/12/2016  . Degenerative arthritis of left knee 06/28/2016  . Iron deficiency anemia 03/16/2016  . Arthritis of left acromioclavicular joint 01/03/2016  . Rotator cuff disorder 01/03/2016  . Osteoarthritis of left knee 04/30/2015  . Baker's cyst of knee  04/30/2015  . Esophageal stricture 04/26/2015  . Benign prostatic hyperplasia 02/21/2015  . Constipation 02/21/2015  . Myalgia and myositis 11/18/2014  . Polyarticular arthritis 07/19/2013  . Chronic radicular low back pain 12/15/2012  . History of gout 02/20/2010  . ERECTILE DYSFUNCTION, ORGANIC 02/20/2010  . Hyperlipidemia 10/18/2008  . CAD 08/18/2007  . Essential hypertension 05/09/2007  . ALLERGIC RHINITIS 03/27/2007   Past Medical History:  Diagnosis Date  . ACTINIC KERATOSIS, HEAD 03/23/2008  . ALLERGIC RHINITIS 03/27/2007  . Arthritis   . BENIGN PROSTATIC HYPERTROPHY, WITH URINARY OBSTRUCTION 10/16/2007  . CAD 08/18/2007   "patient denies any issues with heart, does not see cardiologist"  . Carpal tunnel syndrome, bilateral 09/04/2016  . CARPAL TUNNEL SYNDROME, LEFT 04/13/2008  . Chronic back pain   . Chronic neck pain   . DVT (deep venous thrombosis) ([place])   . ERECTILE DYSFUNCTION, ORGANIC 02/20/2010  . Gout, unspecified 02/20/2010  . Headache(784.0)    migraines hx of  . History of melanoma   . HYPERLIPIDEMIA, WITH HIGH HDL 10/18/2008  . HYPERTENSION 05/09/2007   sees Dr. Benay Pillow  . KNEE PAIN 10/10/2009  . LOW BACK PAIN 03/27/2007  . NEOPLASM, MALIGNANT, SKIN, TRUNK 05/09/2007  . Patellar tendinitis 10/10/2009  . SKIN CANCER, HX OF 03/27/2007  . UNS ADVRS EFF UNS RX MEDICINAL&BIOLOGICAL SBSTNC 08/18/2007  . Urinary urgency     Past Surgical History:  Procedure Laterality Date  . APPENDECTOMY  1969  . BACK SURGERY    . BLEPHAROPLASTY    . CARPAL TUNNEL RELEASE    . CHOLECYSTECTOMY    . ESOPHAGOGASTRODUODENOSCOPY (EGD) WITH PROPOFOL N/A 04/26/2015  Procedure: ESOPHAGOGASTRODUODENOSCOPY (EGD) WITH PROPOFOL;  Surgeon: Inda Castle, MD;  Location: [facility];  Service: Endoscopy;  Laterality: N/A;  . EYE SURGERY     bilateral cataracts removed and vitrectomies  . JOINT REPLACEMENT Right    knee  . KNEE ARTHROSCOPY    . LAMINECTOMY    . LATERAL FUSION LUMBAR SPINE,  TRANSVERSE    . LUMBAR LAMINECTOMY/DECOMPRESSION MICRODISCECTOMY Left 11/03/2012   Procedure: LUMBAR LAMINECTOMY/DECOMPRESSION MICRODISCECTOMY 1 LEVEL;  Surgeon: Hosie Spangle, MD;  Location: [facility];  Service: Neurosurgery;  Laterality: Left;  LEFT L5S1 laminotomy foraminotomy and possible microdiskectomy  . MELANOMA EXCISION    . NASAL SINUS SURGERY    . POSTERIOR CERVICAL FUSION/FORAMINOTOMY Right 10/01/2012   Procedure: POSTERIOR CERVICAL FUSION/FORAMINOTOMY LEVEL 1;  Surgeon: Hosie Spangle, MD;  Location: [place] [facility];  Service: Neurosurgery;  Laterality: Right;  Right Cervical seven thoracic one Cervical laminectomy/foraminotomy with posterior cervical arthrodesis   . POSTERIOR LUMBAR FUSION N/A 05/23/2013   Procedure: exploration of lumbar wound, explantation of left interbody implant.;  Surgeon: Hosie Spangle, MD;  Location: [place];  Service: Neurosurgery;  Laterality: N/A;  . RECTAL SURGERY     Fissure  . VITRECTOMY     right and left 2013    Current Facility-Administered Medications  Medication Dose Route Frequency Provider Last Rate Last Dose  . carvedilol (COREG) 12.5 MG tablet           . ceFAZolin (ANCEF) 2-4 GM/100ML-% IVPB           . ceFAZolin (ANCEF) IVPB 2g/100 mL premix  2 g Intravenous On Call to OR Magnant, Charles L, PA-C      . chlorhexidine (HIBICLENS) 4 % liquid 4 application  60 mL Topical Once Magnant, Charles L, PA-C      . chlorhexidine (HIBICLENS) 4 % liquid 4 application  60 mL Topical Once Magnant, Charles L, PA-C      . povidone-iodine 10 % swab 2 application  2 application Topical Once Magnant, Charles L, PA-C      . tranexamic acid (CYKLOKAPRON) 1000MG /142mL IVPB           . tranexamic acid (CYKLOKAPRON) IVPB 1,000 mg  1,000 mg Intravenous To OR Marlou Sa Tonna Corner, MD       Allergies  Allergen Reactions  . Amlodipine     Ankle swelling  . Gabapentin     Lower extremity edema  . Lyrica [Pregabalin] Swelling    Social History    Tobacco Use  . Smoking status: Former Smoker    Packs/day: 1.00    Years: 33.00    Pack years: 33.00    Quit date: 09/29/1981    Years since quitting: 37.6  . Smokeless tobacco: Never Used  Substance Use Topics  . Alcohol use: Yes    Alcohol/week: 3.0 standard drinks    Types: 3 Standard drinks or equivalent per week    Comment: Social    Family History  Problem Relation Age of Onset  . Dementia Mother   . Anemia Father   . Heart disease Father   . Stroke Sister        ICH  . Lung disease Sister      Review of Systems  Musculoskeletal: Positive for joint pain.  All other systems reviewed and are negative.   Objective:  Physical Exam  Constitutional: He appears well-developed.  HENT:  Head: Normocephalic.  Eyes: Pupils are equal, round, and reactive to light.  Neck: Normal  range of motion.  Cardiovascular: Normal rate.  Respiratory: Effort normal.  Neurological: He is alert.  Skin: Skin is warm.  Psychiatric: He has a normal mood and affect.  Examination of the left leg demonstrates trace palpable pedal pulses bilaterally.  Foot is warm and perfused.  Patient has medial and lateral sided tenderness but no groin pain with internal or external rotation of the left leg.  About a 5 degree flexion contracture and he does have flexion past 90.  Collaterals are stable.  No other masses lymphadenopathy or skin changes noted in that left knee region.  Patellofemoral crepitus is present  Vital signs in last 24 hours: Temp:  [98.4 F (36.9 C)] 98.4 F (36.9 C) (09/24 0547) Pulse Rate:  [82] 82 (09/24 0547) Resp:  [20] 20 (09/24 0547) BP: (146)/(63) 146/63 (09/24 0547) SpO2:  [97 %] 97 % (09/24 0547) Weight:  [89.9 kg] 89.9 kg (09/24 0547)  Labs:   Estimated body mass index is 26.88 kg/m as calculated from the following:   Height as of this encounter: 6' (1.829 m).   Weight as of this encounter: 89.9 kg.   Imaging Review Plain radiographs demonstrate severe  degenerative joint disease of the left knee(s). The overall alignment ismild varus. The bone quality appears to be fair for age and reported activity level.      Assessment/Plan:  End stage arthritis, left knee   The patient history, physical examination, clinical judgment of the provider and imaging studies are consistent with end stage degenerative joint disease of the left knee(s) and total knee arthroplasty is deemed medically necessary. The treatment options including medical management, injection therapy arthroscopy and arthroplasty were discussed at length. The risks and benefits of total knee arthroplasty were presented and reviewed. The risks due to aseptic loosening, infection, stiffness, patella tracking problems, thromboembolic complications and other imponderables were discussed. The patient acknowledged the explanation, agreed to proceed with the plan and consent was signed. Patient is being admitted for inpatient treatment for surgery, pain control, PT, OT, prophylactic antibiotics, VTE prophylaxis, progressive ambulation and ADL's and discharge planning. The patient is planning to be discharged to skilled nursing facility    Anticipated LOS equal to or greater than 2 midnights due to - Age 53 and older with one or more of the following:  - Obesity  - Expected need for hospital services (PT, OT, Nursing) required for safe  discharge  - Anticipated need for postoperative skilled nursing care or inpatient rehab  - Active co-morbidities: Chronic pain requiring opiods OR   - Unanticipated findings during/Post Surgery: Slow post-op progression: GI, pain control, mobility  - Patient is a high risk of re-admission due to: None

## 2019-05-07 NOTE — Plan of Care (Signed)

## 2019-05-07 NOTE — Progress Notes (Signed)
Orthopedic Tech Progress Note Patient Details:  [name] 1931-10-19 [id number]  CPM Left Knee CPM Left Knee: On Left Knee Flexion (Degrees): 40 Left Knee Extension (Degrees): 10 Additional Comments: foot roll  Post Interventions Patient Tolerated: Well Instructions Provided: Care of device  [name] 05/07/2019, 12:29 PM

## 2019-05-07 NOTE — Anesthesia Procedure Notes (Signed)
Anesthesia Regional Block: Adductor canal block   Pre-Anesthetic Checklist: ,, timeout performed, Correct Patient, Correct Site, Correct Laterality, Correct Procedure, Correct Position, site marked, Risks and benefits discussed,  Surgical consent,  Pre-op evaluation,  At surgeon's request and post-op pain management  Laterality: Left  Prep: chloraprep       Needles:  Injection technique: Single-shot  Needle Type: Stimulator Needle - 80     Needle Length: 10cm  Needle Gauge: 21     Additional Needles:   Narrative:  Start time: 05/07/2019 7:13 AM End time: 05/07/2019 7:23 AM Injection made incrementally with aspirations every 5 mL.  Performed by: Personally

## 2019-05-07 NOTE — Evaluation (Signed)
Physical Therapy Evaluation Patient Details Name: [name] MRN: [id number] DOB: [birth year] Today's Date: 05/07/2019   History of Present Illness  Pt is an 85 y/o male s/p L TKA. PMH includes HTN, DVT, gout, skin CA and back surgery.   Clinical Impression  Pt is s/p surgery above with deficits below. Gait limited to within the room secondary to pain this session. Reviewed supine HEP and knee precautions with pt. Will continue to follow acutely to maximize functional mobility independence and safety.     Follow Up Recommendations Follow surgeon's recommendation for DC plan and follow-up therapies;Supervision for mobility/OOB    Equipment Recommendations  Rolling walker with 5" wheels;3in1 (PT)    Recommendations for Other Services       Precautions / Restrictions Precautions Precautions: Knee Precaution Booklet Issued: No Precaution Comments: Verbally reviewed knee precautions.  Restrictions Weight Bearing Restrictions: Yes LLE Weight Bearing: Weight bearing as tolerated      Mobility  Bed Mobility Overal bed mobility: Needs Assistance Bed Mobility: Supine to Sit;Sit to Supine     Supine to sit: Supervision Sit to supine: Min assist   General bed mobility comments: Supervision to come to EOB. Min A for LLE assist for return to supine.   Transfers Overall transfer level: Needs assistance Equipment used: Rolling walker (2 wheeled) Transfers: Sit to/from Stand Sit to Stand: Min assist         General transfer comment: Min A For lift assist and steadying.   Ambulation/Gait Ambulation/Gait assistance: Min guard Gait Distance (Feet): 10 Feet Assistive device: Rolling walker (2 wheeled) Gait Pattern/deviations: Step-through pattern;Decreased step length - right;Decreased step length - left;Decreased weight shift to left;Antalgic Gait velocity: Decreased   General Gait Details: Slow, antalgic gait. Distance limited to within the room secondary to pain. Cues for  sequencing using RW.   Stairs            Wheelchair Mobility    Modified Rankin (Stroke Patients Only)       Balance Overall balance assessment: Needs assistance Sitting-balance support: No upper extremity supported;Feet supported Sitting balance-Leahy Scale: Good     Standing balance support: Bilateral upper extremity supported;During functional activity Standing balance-Leahy Scale: Poor Standing balance comment: Reliant on BUE support                              Pertinent Vitals/Pain Pain Assessment: 0-10 Pain Score: 9  Pain Location: L knee Pain Descriptors / Indicators: Aching;Operative site guarding Pain Intervention(s): Limited activity within patient's tolerance;Monitored during session;Repositioned    Home Living Family/patient expects to be discharged to:: Private residence Living Arrangements: Spouse/significant other Available Help at Discharge: Family Type of Home: House Home Access: Elevator     Home Layout: One level Home Equipment: Bedside commode      Prior Function Level of Independence: Independent               Hand Dominance        Extremity/Trunk Assessment   Upper Extremity Assessment Upper Extremity Assessment: Overall WFL for tasks assessed    Lower Extremity Assessment Lower Extremity Assessment: LLE deficits/detail LLE Deficits / Details: Deficits consistent with post op pain and weakness.     Cervical / Trunk Assessment Cervical / Trunk Assessment: Normal  Communication   Communication: No difficulties  Cognition Arousal/Alertness: Awake/alert Behavior During Therapy: WFL for tasks assessed/performed Overall Cognitive Status: Within Functional Limits for tasks assessed  General Comments      Exercises Total Joint Exercises Ankle Circles/Pumps: AROM;Both;20 reps;Supine Quad Sets: AROM;Left;10 reps;Supine   Assessment/Plan    PT  Assessment Patient needs continued PT services  PT Problem List Decreased strength;Decreased range of motion;Decreased activity tolerance;Decreased balance;Decreased mobility;Decreased knowledge of use of DME;Decreased knowledge of precautions;Pain       PT Treatment Interventions DME instruction;Gait training;Functional mobility training;Therapeutic exercise;Therapeutic activities;Balance training;Patient/family education    PT Goals (Current goals can be found in the Care Plan section)  Acute Rehab PT Goals Patient Stated Goal: to decrease pain  PT Goal Formulation: With patient Time For Goal Achievement: 05/21/19 Potential to Achieve Goals: Good    Frequency 7X/week   Barriers to discharge Other (comment) Pt's wife disabled and will not be able to provide physical assist.     Co-evaluation               AM-PAC PT "6 Clicks" Mobility  Outcome Measure Help needed turning from your back to your side while in a flat bed without using bedrails?: None Help needed moving from lying on your back to sitting on the side of a flat bed without using bedrails?: None Help needed moving to and from a bed to a chair (including a wheelchair)?: A Little Help needed standing up from a chair using your arms (e.g., wheelchair or bedside chair)?: A Little Help needed to walk in hospital room?: A Little Help needed climbing 3-5 steps with a railing? : A Lot 6 Click Score: 19    End of Session Equipment Utilized During Treatment: Gait belt Activity Tolerance: Patient limited by pain Patient left: in bed;with call bell/phone within reach Nurse Communication: Mobility status PT Visit Diagnosis: Other abnormalities of gait and mobility (R26.89);Pain Pain - Right/Left: Left Pain - part of body: Knee    Time: [id number] PT Time Calculation (min) (ACUTE ONLY): 23 min   Charges:   PT Evaluation $PT Eval Low Complexity: 1 Low PT Treatments $Therapeutic Activity: 8-22 mins         [name], PT, DPT  Acute Rehabilitation Services  Pager: [phone] Office: [phone]   [name] 05/07/2019, 6:28 PM

## 2019-05-07 NOTE — Progress Notes (Signed)
Patient stable Pain controlled Left foot perfused and sensate and mobile with ankle dorsiflexion and plantarflexion Patient has been up in the room Plan at this time is to continue CPM and continue physical therapy.  He will have to be very functionally independent at home to help with his wife's care also.  Anticipate discharge Saturday or  most likely Sunday.

## 2019-05-07 NOTE — Care Management (Signed)
CM consult acknowledged to assist with any HH/DME needs.   Midge Minium RN, BSN, NCM-BC, ACM-RN [phone]

## 2019-05-07 NOTE — Anesthesia Procedure Notes (Signed)
Spinal  Patient location during procedure: OR Start time: 05/07/2019 7:28 AM End time: 05/07/2019 7:18 AM Staffing Anesthesiologist: Duane Boston, MD Performed: anesthesiologist  Preanesthetic Checklist Completed: patient identified, surgical consent, pre-op evaluation, timeout performed, IV checked, risks and benefits discussed and monitors and equipment checked Spinal Block Patient position: sitting Prep: DuraPrep Patient monitoring: cardiac monitor, continuous pulse ox and blood pressure Approach: right paramedian Location: L2-3 Injection technique: single-shot Needle Needle type: Pencan  Needle gauge: 24 G Needle length: 9 cm Additional Notes Functioning IV was confirmed and monitors were applied. Sterile prep and drape, including hand hygiene and sterile gloves were used. The patient was positioned and the spine was prepped. The skin was anesthetized with lidocaine.  Free flow of clear CSF was obtained prior to injecting local anesthetic into the CSF.  The spinal needle aspirated freely following injection.  The needle was carefully withdrawn.  The patient tolerated the procedure well.

## 2019-05-07 NOTE — Care Plan (Signed)
Ortho Bundle Case Management Note  Patient Details  Name: [name] MRN: [id number] Date of Birth: July 12, 1932   [facility] contacted patient to discuss [facility] Ortho bundle for his upcoming Left TKA with Dr. Marlou Sa on 05/07/19. Reviewed all questions related to surgery and after care. Patient states he lives with his wife, who is in need of caregivers. He has had a previous knee replacement and feels he will be able to go home with assistance from a nearby relative. A home CPM has been ordered through [facility] and will be delivered to his home per rep. He did indicate that he will need a FWW, but not a BSC/3 in 1. Will order these through vendor to be delivered to hospital prior to discharge. HHPT is anticipated after discharge from hospital. [facility] liaison has been made aware of referral. Choice provided to patient. RNCM will continue to follow for any further CM needs.                         DME Arranged:  CPM, Walker rolling DME Agency:  [employer]  HH Arranged:  PT [place] Agency:  [facility] (now [facility])  Additional Comments: Please contact me with any questions of if this plan should need to change.  Jamse Arn, RN, BSN, [employer]  [phone] 05/07/2019, 3:23 PM

## 2019-05-07 NOTE — Brief Op Note (Signed)
05/07/2019  9:59 AM  PATIENT:  [name]  85 y.o. male  PRE-OPERATIVE DIAGNOSIS:  LEFT KNEE OSTEOARTHRITIS  POST-OPERATIVE DIAGNOSIS:  LEFT KNEE OSTEOARTHRITIS  PROCEDURE:  Procedure(s): LEFT TOTAL KNEE ARTHROPLASTY  SURGEON:  Surgeon(s): [name], MD  ASSISTANT: magnant pa  ANESTHESIA:   spinal  EBL: 75 ml    No intake/output data recorded.  BLOOD ADMINISTERED: none  DRAINS: none   LOCAL MEDICATIONS USED: Marcaine morphine clonidine Exparel  SPECIMEN:  No Specimen  COUNTS:  YES  TOURNIQUET:   Total Tourniquet Time Documented: area (Left) - 64 minutes Total: area (Left) - 64 minutes   DICTATION: .Other Dictation: Dictation Number 6 7 press-fit 13 mm polyethylene insert with 38 mm 3 press-fit patella cruciate retaining surgeon 10 is having persistent back pain Is an 85 year old patient with end-stage left knee arthritis who presents for operative intervention versus risk procedure in detail patient brought the operating room where spinal anesthetic was induced prepped months with return hospital left knee was prescribed alcohol Betadine allowed to air dry prep for procedure draped in sterile manner administered field timeout was called legs elevated 70 Esmarch wrap tourniquet time 64 minutes at 3 mmHg median parapatellar approach to the knee was made skin subcu tissue sharply divided but parapatellar arthrotomy was marked with normal Vicryl suture is removed from the anterior distal femur lateral patellofemoral ligament was released fat pad was partially excised and minimal soft tissue dissection was performed in the medial side.  ACL was released and the anterior horn lateral meniscus was released.  Intramedullary alignment was then utilized to make a cut on the tibia approximately 2-3 2 mm insert 9 mm roughly suspected lateral tibial plateau.  The cup was then later revised to get under some sclerotic bone medially.  I also revised 2 more millimeters.   Bone quality was excellent alone.  Attention directed towards the femur.  8 mm template off the distal femur cut in 5 degrees of valgus.  Spacer block placed and 1113 spacer block gave excellent extension good stability and alignment.  To the femur was then sized to a size 6.  Anterior posterior and chamfer cuts were made.  Tibia was then keel punched and final preparation was made in that there.  The tibia was then a trial components were placed.  Patella was cut down from 24-12 and a 33 peg trial patella was placed.  Patient had a full extension full flexion with no liftoff very good patellar tracking.  Good stability varus valgus stress at 0 39 degrees.  At this time trial components removed thorough irrigation with 3 L of irrigating solution performed.  No more constipation capsule.  Transiently casted sponges used to sit 15 minutes with the joint itself.  At this time was removed components were tapped into addition with excellent press-fit and excellent fit obtained.  Patient did require small lateral release to optimize patellar tracking which was excellent with no thumbs technique and full range of motion.  Tourniquet released at this time bleeding forcing her closure electrocautery for irrigation and utilized along with Aricept which was utilized all times during the case to diminish infection.  Arthrotomy was closed over bolster using #1 Vicryl suture followed later converted 0 Vicryl suture 302 0 Vicryl suture and 3-0 Monocryl.  Patient Marcaine morphine clonidine injected into the knee at the end of the case for postop pain relief.  Patient tolerated well by me palpitation transferred recovery in stable condition.  Steri-Strips Aquasol  dressing and knee immobilizer were placed.  Which assisted at all times during the case for retraction opening closing consistent with left and assessment.  Finger pain in a patient Quad-Lumen  [id number]     PLAN OF CARE: Admit to inpatient   PATIENT DISPOSITION:   PACU - hemodynamically stable

## 2019-05-07 NOTE — Anesthesia Postprocedure Evaluation (Signed)
Anesthesia Post Note  Patient: SEGUNDO NEVEL  Procedure(s) Performed: LEFT TOTAL KNEE ARTHROPLASTY (Left Knee)     Patient location during evaluation: PACU Anesthesia Type: Spinal Level of consciousness: awake and alert Pain management: pain level controlled Vital Signs Assessment: post-procedure vital signs reviewed and stable Respiratory status: spontaneous breathing and respiratory function stable Cardiovascular status: blood pressure returned to baseline and stable Postop Assessment: spinal receding Anesthetic complications: no    Last Vitals:  Vitals:   05/07/19 1240 05/07/19 1301  BP: 113/62 (!) 110/56  Pulse: 68 66  Resp: (!) 27 18  Temp: 36.8 C 36.4 C  SpO2: 96% 96%    Last Pain:  Vitals:   05/07/19 1301  TempSrc: Oral  PainSc:                  Semiah Konczal DANIEL

## 2019-05-07 NOTE — Addendum Note (Signed)
Addendum  created 05/07/19 1435 by Adalberto Ill, CRNA   Intraprocedure Staff edited

## 2019-05-08 ENCOUNTER — Encounter (HOSPITAL_COMMUNITY): Payer: Self-pay | Admitting: Orthopedic Surgery

## 2019-05-08 DIAGNOSIS — J309 Allergic rhinitis, unspecified: Secondary | ICD-10-CM | POA: Diagnosis present

## 2019-05-08 DIAGNOSIS — N400 Enlarged prostate without lower urinary tract symptoms: Secondary | ICD-10-CM | POA: Diagnosis present

## 2019-05-08 DIAGNOSIS — Z823 Family history of stroke: Secondary | ICD-10-CM | POA: Diagnosis not present

## 2019-05-08 DIAGNOSIS — Z87891 Personal history of nicotine dependence: Secondary | ICD-10-CM | POA: Diagnosis not present

## 2019-05-08 DIAGNOSIS — E669 Obesity, unspecified: Secondary | ICD-10-CM | POA: Diagnosis present

## 2019-05-08 DIAGNOSIS — I110 Hypertensive heart disease with heart failure: Secondary | ICD-10-CM | POA: Diagnosis present

## 2019-05-08 DIAGNOSIS — I5032 Chronic diastolic (congestive) heart failure: Secondary | ICD-10-CM | POA: Diagnosis present

## 2019-05-08 DIAGNOSIS — Z96651 Presence of right artificial knee joint: Secondary | ICD-10-CM | POA: Diagnosis present

## 2019-05-08 DIAGNOSIS — M1712 Unilateral primary osteoarthritis, left knee: Secondary | ICD-10-CM | POA: Diagnosis present

## 2019-05-08 DIAGNOSIS — Z981 Arthrodesis status: Secondary | ICD-10-CM | POA: Diagnosis not present

## 2019-05-08 DIAGNOSIS — Z8249 Family history of ischemic heart disease and other diseases of the circulatory system: Secondary | ICD-10-CM | POA: Diagnosis not present

## 2019-05-08 NOTE — Plan of Care (Signed)

## 2019-05-08 NOTE — Plan of Care (Signed)
  Problem: Pain Managment: Goal: General experience of comfort will improve Outcome: Progressing   

## 2019-05-08 NOTE — Progress Notes (Signed)
Pt refused CPM this morning saying, he wants to eat breakfast and wash up first before, will update the incoming nurse to call [employer] again when patient is ready, thanks!

## 2019-05-08 NOTE — Progress Notes (Signed)
  Subjective: Patient stable.  Pain controlled.  He has not been out of bed yet.  Plans to do the CPM machine after breakfast   Objective: Vital signs in last 24 hours: Temp:  [97.6 F (36.4 C)-98.7 F (37.1 C)] 98.7 F (37.1 C) (09/25 0802) Pulse Rate:  [64-95] 95 (09/25 0802) Resp:  [10-42] 16 (09/25 0802) BP: (103-149)/(56-78) 149/78 (09/25 0802) SpO2:  [92 %-97 %] 96 % (09/25 0802)  Intake/Output from previous day: 09/24 0701 - 09/25 0700 In: 223.1 [I.V.:123.1; IV Piggyback:100] Out: 650 [Urine:600; Blood:50] Intake/Output this shift: No intake/output data recorded.  Exam:  Sensation intact distally Dorsiflexion/Plantar flexion intact  Labs: No results for input(s): HGB in the last 72 hours. No results for input(s): WBC, RBC, HCT, PLT in the last 72 hours. No results for input(s): NA, K, CL, CO2, BUN, CREATININE, GLUCOSE, CALCIUM in the last 72 hours. No results for input(s): LABPT, INR in the last 72 hours.  Assessment/Plan: Plan at this time is mobilization with physical therapy today.  Continue with CPM machine for range of motion.  Anticipate Saturday versus Sunday discharge once he is more independent with his mobility.   G Scott Bronc Brosseau 05/08/2019, 8:11 AM

## 2019-05-08 NOTE — Progress Notes (Addendum)
  Subjective: [name] is a 85 y.o. male s/p left TKA.  They are POD1.  Pt's pain is mostly controlled.  Pt denies numbness/tingling/weakness.  Pt has not ambulated yet.     Objective: Vital signs in last 24 hours: Temp:  [97.9 F (36.6 C)-98.9 F (37.2 C)] 98.9 F (37.2 C) (09/25 0943) Pulse Rate:  [65-95] 92 (09/25 0943) Resp:  [15-16] 15 (09/25 0943) BP: (106-149)/(58-78) 128/68 (09/25 0943) SpO2:  [92 %-96 %] 94 % (09/25 0943)  Intake/Output from previous day: 09/24 0701 - 09/25 0700 In: 223.1 [I.V.:123.1; IV Piggyback:100] Out: 650 [Urine:600; Blood:50] Intake/Output this shift: Total I/O In: 240 [P.O.:240] Out: 400 [Urine:400]  Exam:  No gross blood or drainage overlying the dressing 1+ DP pulse Sensation intact distally in the left foot Able to dorsiflex and plantarflex the left foot   Labs: No results for input(s): HGB in the last 72 hours. No results for input(s): WBC, RBC, HCT, PLT in the last 72 hours. No results for input(s): NA, K, CL, CO2, BUN, CREATININE, GLUCOSE, CALCIUM in the last 72 hours. No results for input(s): LABPT, INR in the last 72 hours.  Assessment/Plan: Pt is POD1 s/p left TKA.    -Plan to discharge to home, likely in 2-3 days, pending patient's pain and PT eval  -WBAT with a walker  -Okay to shower, dressing is waterproof.  Cautioned patient against soaking dressing in bath/pool/body of water  -Encouraged the use of the blue cradle boot to work on extension.  Cautioned patient against using a pillow under their knee.  -Use the CPM machine at least 3 times per day for one hour each time, increasing the degrees daily.     [name] L Kason Benak 05/08/2019, 2:04 PM

## 2019-05-08 NOTE — TOC Initial Note (Signed)
Transition of Care [facility]) - Initial/Assessment Note    Patient Details  Name: [name] MRN: [id number] Date of Birth: 1932/05/07  Transition of Care [facility]) CM/SW Contact:    Midge Minium RN, BSN, NCM-BC, ACM-RN [phone] Phone Number: 05/08/2019, 12:15 PM  Clinical Narrative:                 CM following for dispositional needs. Patient is s/p L TKA; lives at home and was independent PTA. HH was arranged by Venida Jarvis  RN, with [facility] for HHPT services and [employer] for RW/CPM with the pre-op Ortho bundle. Patients RW has been delivered to the room; CPM will be delivered by the [employer] rep. CM team will continue to follow.   Expected Discharge Plan: [place] Barriers to Discharge: Continued Medical Work up   Expected Discharge Plan and Services Expected Discharge Plan: [place] In-house Referral: NA Discharge Planning Services: CM Consult Post Acute Care Choice: Durable Medical Equipment, Home Health Living arrangements for the past 2 months: Single Family Home                 DME Arranged: CPM, Walker rolling DME Agency: [employer]       HH Arranged: PT [place] Agency: [place] (now [facility])        Prior Living Arrangements/Services Living arrangements for the past 2 months: [place] Lives with:: Self, Spouse    Admission diagnosis:  LEFT KNEE OSTEOARTHRITIS Patient Active Problem List   Diagnosis Date Noted  . Arthritis of left knee 05/07/2019  . Pre-operative clearance 04/28/2019  . Acute diastolic CHF (congestive heart failure) ([place]) 08/10/2018  . Leg wound, left 03/19/2018  . Right arm pain 11/11/2017  . Primary osteoarthritis of right foot 08/21/2017  . Syncope 02/27/2017  . Arthritis of right ankle 12/18/2016  . Carpal tunnel syndrome, bilateral 09/04/2016  . Leg swelling 08/16/2016  . Hand weakness 07/12/2016  . Degenerative arthritis of left knee 06/28/2016  . Iron deficiency anemia 03/16/2016   . Arthritis of left acromioclavicular joint 01/03/2016  . Rotator cuff disorder 01/03/2016  . Osteoarthritis of left knee 04/30/2015  . Baker's cyst of knee 04/30/2015  . Esophageal stricture 04/26/2015  . Benign prostatic hyperplasia 02/21/2015  . Constipation 02/21/2015  . Myalgia and myositis 11/18/2014  . Polyarticular arthritis 07/19/2013  . Chronic radicular low back pain 12/15/2012  . History of gout 02/20/2010  . ERECTILE DYSFUNCTION, ORGANIC 02/20/2010  . Hyperlipidemia 10/18/2008  . CAD 08/18/2007  . Essential hypertension 05/09/2007  . ALLERGIC RHINITIS 03/27/2007   PCP:  Hoyt Koch, MD Pharmacy:   [id number] - [place], [place] [address] Phone: [phone] Fax: [phone]     Social Determinants of Health (SDOH) Interventions    Readmission Risk Interventions No flowsheet data found.

## 2019-05-08 NOTE — Progress Notes (Signed)
Physical Therapy Treatment Patient Details Name: [name] MRN: [id number] DOB: 02-22-1932 Today's Date: 05/08/2019    History of Present Illness Pt is an 85 y/o male s/p L TKA. PMH includes HTN, DVT, gout, skin CA and back surgery.     PT Comments    Pt continues to be extremely limited secondary to pain. Was not agreeable to OOB mobility despite max education about importance of mobility. Was agreeable to performing supine HEP. RN in room at end of session to administer pain meds. Will continue to follow acutely to maximize functional mobility independence and safety.    Follow Up Recommendations  Follow surgeon's recommendation for DC plan and follow-up therapies;Supervision for mobility/OOB     Equipment Recommendations  Rolling walker with 5" wheels;3in1 (PT)    Recommendations for Other Services       Precautions / Restrictions Precautions Precautions: Knee Precaution Booklet Issued: No Precaution Comments: Verbally reviewed knee precautions.  Restrictions Weight Bearing Restrictions: Yes LLE Weight Bearing: Weight bearing as tolerated    Mobility  Bed Mobility               General bed mobility comments: Pt refusing OOB mobility secondary to pain. Had to coach pt through deep breathing to calm anxiety and help with pain management.   Transfers                    Ambulation/Gait                 Stairs             Wheelchair Mobility    Modified Rankin (Stroke Patients Only)       Balance                                            Cognition Arousal/Alertness: Awake/alert Behavior During Therapy: Anxious(self limiting ) Overall Cognitive Status: Within Functional Limits for tasks assessed                                        Exercises Total Joint Exercises Ankle Circles/Pumps: AROM;Both;20 reps;Supine Quad Sets: AROM;Left;10 reps;Supine Towel Squeeze: AROM;Both;10  reps;Supine Heel Slides: AAROM;Left;Supine;5 reps;Limitations Heel Slides Limitations: Limited secondary to pain     General Comments General comments (skin integrity, edema, etc.): Had discussion with pt about importance of mobility following surgery.       Pertinent Vitals/Pain Pain Assessment: 0-10 Pain Score: 10-Worst pain ever Pain Location: L knee Pain Descriptors / Indicators: Aching;Operative site guarding Pain Intervention(s): Limited activity within patient's tolerance;Monitored during session;Repositioned;Patient requesting pain meds-RN notified    Home Living                      Prior Function            PT Goals (current goals can now be found in the care plan section) Acute Rehab PT Goals Patient Stated Goal: to decrease pain  PT Goal Formulation: With patient Time For Goal Achievement: 05/21/19 Potential to Achieve Goals: Good Progress towards PT goals: Not progressing toward goals - comment(limited secondary to pain )    Frequency    7X/week      PT Plan Current plan remains appropriate    Co-evaluation  AM-PAC PT "6 Clicks" Mobility   Outcome Measure  Help needed turning from your back to your side while in a flat bed without using bedrails?: A Little Help needed moving from lying on your back to sitting on the side of a flat bed without using bedrails?: A Little Help needed moving to and from a bed to a chair (including a wheelchair)?: A Little Help needed standing up from a chair using your arms (e.g., wheelchair or bedside chair)?: A Little Help needed to walk in hospital room?: A Little Help needed climbing 3-5 steps with a railing? : A Lot 6 Click Score: 17    End of Session   Activity Tolerance: Patient limited by pain Patient left: in bed;with call bell/phone within reach;Other (comment)(ice applied) Nurse Communication: Mobility status;Patient requests pain meds PT Visit Diagnosis: Other abnormalities of  gait and mobility (R26.89);Pain Pain - Right/Left: Left Pain - part of body: Knee     Time: [id number] PT Time Calculation (min) (ACUTE ONLY): 17 min  Charges:  $Therapeutic Exercise: 8-22 mins                     [name], PT, DPT  Acute Rehabilitation Services  Pager: [phone] Office: [phone]    [name] 05/08/2019, 4:11 PM

## 2019-05-08 NOTE — Progress Notes (Signed)
Physical Therapy Treatment Patient Details Name: [name] MRN: [id number] DOB: October 08, 1931 Today's Date: 05/08/2019    History of Present Illness Pt is an 85 y/o male s/p L TKA. PMH includes HTN, DVT, gout, skin CA and back surgery.     PT Comments    Pt supine in bed and immediately refusing PT intervention.  Pt required max cues and encouragement to agree to participate in session this am.  He continues to present with self limiting behavior.  Post session he was ready to get back into the bed.  PTA educated on benefits of sitting up and need for OOB.  Pt continues to reports 10/10 pain despite given IV dilaudid before session.  PTA applied ice to L knee and placed his L ankle on foam for supported extension. Plan for pm follow up to progress mobility.     Follow Up Recommendations  Follow surgeon's recommendation for DC plan and follow-up therapies;Supervision for mobility/OOB     Equipment Recommendations  Rolling walker with 5" wheels;3in1 (PT)    Recommendations for Other Services       Precautions / Restrictions Precautions Precautions: Knee Precaution Booklet Issued: No Precaution Comments: Verbally reviewed knee precautions.  Restrictions Weight Bearing Restrictions: Yes LLE Weight Bearing: Weight bearing as tolerated    Mobility  Bed Mobility Overal bed mobility: Needs Assistance Bed Mobility: Supine to Sit     Supine to sit: Min assist     General bed mobility comments: Min assistance to advance LLE to edge of bed and to elevate trunk into sitting.  Transfers Overall transfer level: Needs assistance Equipment used: Rolling walker (2 wheeled) Transfers: Sit to/from Stand Sit to Stand: Min assist;From elevated surface         General transfer comment: Min A For lift assist and steadying.   Ambulation/Gait Ambulation/Gait assistance: Min assist Gait Distance (Feet): 15 Feet Assistive device: Rolling walker (2 wheeled) Gait Pattern/deviations:  Step-through pattern;Decreased step length - right;Decreased step length - left;Decreased weight shift to left;Antalgic Gait velocity: Decreased   General Gait Details: Slow, antalgic gait. Distance limited to within the room secondary to pain. Cues for sequencing using RW. He refused further ambulation despite ability to do so,   Marine scientist Rankin (Stroke Patients Only)       Balance Overall balance assessment: Needs assistance Sitting-balance support: No upper extremity supported;Feet supported Sitting balance-Leahy Scale: Good       Standing balance-Leahy Scale: Poor                              Cognition Arousal/Alertness: Awake/alert Behavior During Therapy: Anxious(self limiting) Overall Cognitive Status: Within Functional Limits for tasks assessed                                        Exercises Total Joint Exercises Ankle Circles/Pumps: AROM;Both;20 reps;Supine Quad Sets: AROM;Left;10 reps;Supine Heel Slides: AAROM;Left;10 reps;Supine Hip ABduction/ADduction: AAROM;Left;10 reps;Supine Straight Leg Raises: AAROM;Left;10 reps;Supine    General Comments        Pertinent Vitals/Pain Pain Assessment: 0-10 Pain Score: 10-Worst pain ever Pain Location: L knee Pain Descriptors / Indicators: Aching;Operative site guarding Pain Intervention(s): Monitored during session;Repositioned    Home Living  Prior Function            PT Goals (current goals can now be found in the care plan section) Acute Rehab PT Goals Patient Stated Goal: to decrease pain  PT Goal Formulation: With patient Potential to Achieve Goals: Good Progress towards PT goals: Progressing toward goals    Frequency    7X/week      PT Plan Current plan remains appropriate    Co-evaluation              AM-PAC PT "6 Clicks" Mobility   Outcome Measure  Help needed turning  from your back to your side while in a flat bed without using bedrails?: A Little Help needed moving from lying on your back to sitting on the side of a flat bed without using bedrails?: None Help needed moving to and from a bed to a chair (including a wheelchair)?: A Little Help needed standing up from a chair using your arms (e.g., wheelchair or bedside chair)?: A Little Help needed to walk in hospital room?: A Little Help needed climbing 3-5 steps with a railing? : A Lot 6 Click Score: 18    End of Session Equipment Utilized During Treatment: Gait belt Activity Tolerance: Patient limited by pain Patient left: in bed;with call bell/phone within reach Nurse Communication: Mobility status PT Visit Diagnosis: Other abnormalities of gait and mobility (R26.89);Pain Pain - Right/Left: Left Pain - part of body: Knee     Time: [id number] PT Time Calculation (min) (ACUTE ONLY): 31 min  Charges:  $Gait Training: 8-22 mins $Therapeutic Exercise: 8-22 mins                     Governor Rooks, PTA Acute Rehabilitation Services Pager [phone] Office [phone]     Dominika Losey Eli Hose 05/08/2019, 10:53 AM

## 2019-05-09 MED ORDER — OXYCODONE HCL ER 10 MG PO T12A
10.0000 mg | EXTENDED_RELEASE_TABLET | Freq: Two times a day (BID) | ORAL | Status: DC
  Administered 2019-05-09 – 2019-05-10 (×4): 10 mg via ORAL
  Filled 2019-05-09 (×4): qty 1

## 2019-05-09 NOTE — Progress Notes (Signed)
Physical Therapy Treatment Patient Details Name: [name] MRN: [id number] DOB: Jun 05, 1932 Today's Date: 05/09/2019    History of Present Illness Pt is an 85 y/o male s/p L TKA. PMH includes HTN, DVT, gout, skin CA and back surgery.    PT Comments    Pt received about to return to bed from sitting in recliner, agreeable to ambulate short distance with RW. Pt not receptive to education regarding improved gait mechanics. Declined L knee ROM/therex, stating, "I'll be better tomorrow." Continue to educate pt on current functional status and importance of mobility. Will follow acutely.   Follow Up Recommendations  Follow surgeon's recommendation for DC plan and follow-up therapies;Home health PT     Equipment Recommendations  Rolling walker with 5" wheels;3in1 (PT)    Recommendations for Other Services       Precautions / Restrictions Precautions Precautions: Knee;Fall Precaution Comments: Verbally reviewed knee precautions.  Restrictions Weight Bearing Restrictions: Yes LLE Weight Bearing: Weight bearing as tolerated    Mobility  Bed Mobility Overal bed mobility: Needs Assistance Bed Mobility: Sit to Supine       Sit to supine: Min assist   General bed mobility comments: MinA to manage LLE; max encouragement to do as much as possible independently  Transfers Overall transfer level: Needs assistance Equipment used: Rolling walker (2 wheeled) Transfers: Sit to/from Stand Sit to Stand: Supervision;From elevated surface         General transfer comment: Pt requesting PT extend L knee before he sits, pt able to do so himself when encouraged; poor eccentric control into sitting  Ambulation/Gait Ambulation/Gait assistance: Supervision Gait Distance (Feet): 40 Feet Assistive device: Rolling walker (2 wheeled) Gait Pattern/deviations: Step-to pattern;Decreased weight shift to left;Antalgic Gait velocity: Decreased Gait velocity interpretation: <1.31 ft/sec,  indicative of household ambulator General Gait Details: Slow, antalgic gait with RW and supervision. Pt not receptive to cues for improved gait mechanics despite intermittent attempts at educating. Declined further distance due to pain   Stairs             Wheelchair Mobility    Modified Rankin (Stroke Patients Only)       Balance Overall balance assessment: Needs assistance Sitting-balance support: No upper extremity supported;Feet supported Sitting balance-Leahy Scale: Good     Standing balance support: Bilateral upper extremity supported;During functional activity Standing balance-Leahy Scale: Poor                              Cognition Arousal/Alertness: Awake/alert Behavior During Therapy: WFL for tasks assessed/performed Overall Cognitive Status: Within Functional Limits for tasks assessed                                 General Comments: Self-limiting, difficult to reason with, not receptive to education      Exercises Other Exercises Other Exercises: (P) When asked pt to do exercises, pt stating, "Sure, you can do it and I'll watch." Not willing to attempt flexing knee sitting at EOB; educ re: importance of    General Comments        Pertinent Vitals/Pain Pain Assessment: Faces Faces Pain Scale: Hurts even more Pain Location: L knee Pain Descriptors / Indicators: Aching;Operative site guarding Pain Intervention(s): Premedicated before session;Limited activity within patient's tolerance    Home Living  Prior Function            PT Goals (current goals can now be found in the care plan section) Progress towards PT goals: Progressing toward goals    Frequency    7X/week      PT Plan Current plan remains appropriate    Co-evaluation              AM-PAC PT "6 Clicks" Mobility   Outcome Measure  Help needed turning from your back to your side while in a flat bed without using  bedrails?: None Help needed moving from lying on your back to sitting on the side of a flat bed without using bedrails?: A Little Help needed moving to and from a bed to a chair (including a wheelchair)?: A Little Help needed standing up from a chair using your arms (e.g., wheelchair or bedside chair)?: A Little Help needed to walk in hospital room?: A Little Help needed climbing 3-5 steps with a railing? : A Little 6 Click Score: 19    End of Session Equipment Utilized During Treatment: Gait belt Activity Tolerance: Patient limited by pain Patient left: in bed;with call bell/phone within reach;with bed alarm set Nurse Communication: Mobility status PT Visit Diagnosis: Other abnormalities of gait and mobility (R26.89);Pain Pain - Right/Left: Left Pain - part of body: Knee     Time: [id number] PT Time Calculation (min) (ACUTE ONLY): 12 min  Charges:  $Gait Training: 8-22 mins                    Mabeline Caras, PT, DPT Acute Rehabilitation Services  Pager [phone] Office [place] 05/09/2019, 2:52 PM

## 2019-05-09 NOTE — Plan of Care (Signed)
  Problem: Pain Managment: Goal: General experience of comfort will improve Outcome: Progressing   

## 2019-05-09 NOTE — Progress Notes (Signed)
     Subjective: 2 Days Post-Op Procedure(s) (LRB): LEFT TOTAL KNEE ARTHROPLASTY (Left) Left knee hurt all night unable to sleep due to pain.  Patient reports pain as marked.    Objective:   VITALS:  Temp:  [98 F (36.7 C)-99.1 F (37.3 C)] 99.1 F (37.3 C) (09/26 0732) Pulse Rate:  [90-108] 90 (09/26 0732) Resp:  [14-16] 14 (09/26 0732) BP: (152-176)/(70-92) 152/70 (09/26 0732) SpO2:  [91 %-96 %] 91 % (09/26 0732)  Neurologically intact ABD soft Neurovascular intact Sensation intact distally Intact pulses distally Dorsiflexion/Plantar flexion intact Incision: dressing C/D/I, scant drainage and Hemarthrosis left knee, calf is soft homan's sign negative.  No cellulitis present Compartment soft   LABS No results for input(s): HGB, WBC, PLT in the last 72 hours. No results for input(s): NA, K, CL, CO2, BUN, CREATININE, GLUCOSE in the last 72 hours. No results for input(s): LABPT, INR in the last 72 hours.   Assessment/Plan: 2 Days Post-Op Procedure(s) (LRB): LEFT TOTAL KNEE ARTHROPLASTY (Left)  Advance diet Up with therapy D/C IV fluids Discharge home with home health  Basil Dess 05/09/2019, 10:13 AM Patient ID: [name], male   DOB: Mar 12, 1940, 85 y.o.   MRN: [id number]

## 2019-05-09 NOTE — Progress Notes (Signed)
Physical Therapy Treatment Patient Details Name: [name] MRN: [id number] DOB: [birth year] Today's Date: 05/09/2019    History of Present Illness Pt is an 85 y/o male s/p L TKA. PMH includes HTN, DVT, gout, skin CA and back surgery.    PT Comments    Pt continues to be self-limiting with c/o L knee pain, requiring max encouragement to participate in therapy session, only agreeable to take steps to recliner. Overall, pt moving well with what he is willing to do, using RW and requiring up to minA for mobility. Pt stating he plans to use w/c at home with assist from wife; cautioned against need for w/c and importance of ambulation/mobility. Will follow-up for afternoon session; pt states he is unsure if he will be willing to ambulate in hallway.    Follow Up Recommendations  Follow surgeon's recommendation for DC plan and follow-up therapies;Home health PT     Equipment Recommendations  Rolling walker with 5" wheels;3in1 (PT)    Recommendations for Other Services       Precautions / Restrictions Precautions Precautions: Knee;Fall Restrictions Weight Bearing Restrictions: Yes LLE Weight Bearing: Weight bearing as tolerated    Mobility  Bed Mobility Overal bed mobility: Needs Assistance Bed Mobility: Supine to Sit     Supine to sit: Min assist     General bed mobility comments: Pt insisting PT assist LLE to EOB, but pt instructed on using RLE to assist and able to do so well. Then insisting PT provide HHA; minA for UE support to elevate trunk. Pt reports wife will be able to do this at home if needed  Transfers Overall transfer level: Needs assistance Equipment used: Rolling walker (2 wheeled) Transfers: Sit to/from Stand Sit to Stand: Supervision;From elevated surface         General transfer comment: Pt insisting PT provide HHA to stand, cued for hand placement and able to stand without physical assist, supervision for safety  Ambulation/Gait Ambulation/Gait  assistance: Min guard Gait Distance (Feet): 4 Feet Assistive device: Rolling walker (2 wheeled) Gait Pattern/deviations: Step-to pattern;Decreased weight shift to left;Antalgic Gait velocity: Decreased Gait velocity interpretation: <1.31 ft/sec, indicative of household ambulator General Gait Details: Max encouragement to take any steps, pt self-limiting with pain. Only agreeable to walk to recliner; doing so with RW and min guard, decrease WB through L knee due to pain   Stairs             Wheelchair Mobility    Modified Rankin (Stroke Patients Only)       Balance Overall balance assessment: Needs assistance Sitting-balance support: No upper extremity supported;Feet supported Sitting balance-Leahy Scale: Good     Standing balance support: Bilateral upper extremity supported;During functional activity Standing balance-Leahy Scale: Poor Standing balance comment: Reliant on BUE support                             Cognition Arousal/Alertness: Awake/alert Behavior During Therapy: WFL for tasks assessed/performed Overall Cognitive Status: Within Functional Limits for tasks assessed                                 General Comments: Self-limiting, difficult to reason with      Exercises      General Comments General comments (skin integrity, edema, etc.): Increased time discussing importance of mobility, pt remains self-limiting stating he will use a w/c at home and  wife can assist with everything; cautioned against use of w/c and concerns with this      Pertinent Vitals/Pain Pain Assessment: Faces Faces Pain Scale: Hurts little more Pain Location: L knee Pain Descriptors / Indicators: Aching;Operative site guarding Pain Intervention(s): Monitored during session;Limited activity within patient's tolerance    Home Living                      Prior Function            PT Goals (current goals can now be found in the care plan  section) Progress towards PT goals: Not progressing toward goals - comment(self-limiting, pain)    Frequency    7X/week      PT Plan Current plan remains appropriate    Co-evaluation              AM-PAC PT "6 Clicks" Mobility   Outcome Measure  Help needed turning from your back to your side while in a flat bed without using bedrails?: None Help needed moving from lying on your back to sitting on the side of a flat bed without using bedrails?: A Little Help needed moving to and from a bed to a chair (including a wheelchair)?: A Little Help needed standing up from a chair using your arms (e.g., wheelchair or bedside chair)?: A Little Help needed to walk in hospital room?: A Little Help needed climbing 3-5 steps with a railing? : A Little 6 Click Score: 19    End of Session   Activity Tolerance: Patient limited by pain Patient left: in chair;with call bell/phone within reach Nurse Communication: Mobility status PT Visit Diagnosis: Other abnormalities of gait and mobility (R26.89);Pain Pain - Right/Left: Left Pain - part of body: Knee     Time: [id number] PT Time Calculation (min) (ACUTE ONLY): 14 min  Charges:  $Therapeutic Activity: 8-22 mins                    Mabeline Caras, PT, DPT Acute Rehabilitation Services  Pager [phone] Office [phone]  [name] 05/09/2019, 9:41 AM

## 2019-05-10 MED ORDER — ASPIRIN 81 MG PO CHEW: 81.0000 mg | CHEWABLE_TABLET | Freq: Two times a day (BID) | ORAL | 0 refills | Status: DC

## 2019-05-10 MED ORDER — BISACODYL 10 MG RE SUPP
10.0000 mg | Freq: Every day | RECTAL | Status: DC | PRN
  Administered 2019-05-10: 10 mg via RECTAL
  Filled 2019-05-10: qty 1

## 2019-05-10 MED ORDER — MAGNESIUM 250 MG PO TABS: 250.0000 mg | ORAL_TABLET | Freq: Every day | ORAL | 2 refills | Status: DC

## 2019-05-10 MED ORDER — METHOCARBAMOL 500 MG PO TABS: 500.0000 mg | ORAL_TABLET | Freq: Three times a day (TID) | ORAL | 0 refills | Status: DC | PRN

## 2019-05-10 MED ORDER — MAGNESIUM HYDROXIDE 400 MG/5ML PO SUSP
30.0000 mL | Freq: Every day | ORAL | Status: DC | PRN
  Administered 2019-05-10: 30 mL via ORAL
  Filled 2019-05-10: qty 30

## 2019-05-10 MED ORDER — OXYCODONE HCL 10 MG PO TABS: 10.0000 mg | ORAL_TABLET | ORAL | 0 refills | Status: DC | PRN

## 2019-05-10 MED ORDER — OXYCODONE HCL ER 10 MG PO T12A: 10.0000 mg | EXTENDED_RELEASE_TABLET | Freq: Two times a day (BID) | ORAL | 0 refills | Status: AC

## 2019-05-10 NOTE — Progress Notes (Signed)
Physical Therapy Treatment Patient Details Name: [name] MRN: [id number] DOB: 1932-05-30 Today's Date: 05/10/2019    History of Present Illness Pt is an 85 y/o male s/p L TKA. PMH includes HTN, DVT, gout, skin CA and back surgery.    PT Comments    Pt progressing with mobility. Moving well with RW at supervision-level; remains self-limiting by pain and unreceptive to majority of education for improved gait mechanics and knee mobility. Max encourage to flex knee, able to achieve ~90' active flexion. Pt planning to d/c home today. If to remain admitted, will continue to follow acutely.    Follow Up Recommendations  Follow surgeon's recommendation for DC plan and follow-up therapies;Home health PT     Equipment Recommendations  Rolling walker with 5" wheels;3in1 (PT)    Recommendations for Other Services       Precautions / Restrictions Precautions Precautions: Knee;Fall Restrictions Weight Bearing Restrictions: Yes LLE Weight Bearing: Weight bearing as tolerated    Mobility  Bed Mobility Overal bed mobility: Needs Assistance Bed Mobility: Supine to Sit     Supine to sit: Modified independent (Device/Increase time)        Transfers Overall transfer level: Needs assistance Equipment used: Rolling walker (2 wheeled) Transfers: Sit to/from Stand Sit to Stand: Supervision         General transfer comment: Increased time and effort preparing to stand, finally decining on correct hand placement without cues; supervision for saferty  Ambulation/Gait Ambulation/Gait assistance: Supervision Gait Distance (Feet): 80 Feet Assistive device: Rolling walker (2 wheeled) Gait Pattern/deviations: Step-to pattern;Decreased weight shift to left;Antalgic Gait velocity: Decreased Gait velocity interpretation: <1.31 ft/sec, indicative of household ambulator General Gait Details: Slow, antalgic gait with RW and supervision. Pt not receptive to cues for improved gait mechanics  despite intermittent attempts at educating. Declined further distance due to pain   Stairs             Wheelchair Mobility    Modified Rankin (Stroke Patients Only)       Balance Overall balance assessment: Needs assistance Sitting-balance support: No upper extremity supported;Feet supported Sitting balance-Leahy Scale: Good     Standing balance support: Bilateral upper extremity supported;During functional activity Standing balance-Leahy Scale: Fair Standing balance comment: Can static stand to void with urinal without UE support; reliant on UE support for dynamic stability                            Cognition Arousal/Alertness: Awake/alert Behavior During Therapy: WFL for tasks assessed/performed Overall Cognitive Status: Within Functional Limits for tasks assessed                                 General Comments: Self-limiting, difficult to reason with, not receptive to education      Exercises Other Exercises Other Exercises: Active knee flex ~90'    General Comments        Pertinent Vitals/Pain Pain Assessment: Faces Faces Pain Scale: Hurts little more Pain Location: L knee Pain Descriptors / Indicators: Aching;Operative site guarding;Moaning Pain Intervention(s): Monitored during session    Home Living                      Prior Function            PT Goals (current goals can now be found in the care plan section) Acute Rehab PT Goals Patient  Stated Goal: to decrease pain  PT Goal Formulation: With patient Time For Goal Achievement: 05/21/19 Potential to Achieve Goals: Good Progress towards PT goals: Progressing toward goals    Frequency    7X/week      PT Plan Current plan remains appropriate    Co-evaluation              AM-PAC PT "6 Clicks" Mobility   Outcome Measure  Help needed turning from your back to your side while in a flat bed without using bedrails?: None Help needed moving  from lying on your back to sitting on the side of a flat bed without using bedrails?: None Help needed moving to and from a bed to a chair (including a wheelchair)?: A Little Help needed standing up from a chair using your arms (e.g., wheelchair or bedside chair)?: A Little Help needed to walk in hospital room?: A Little Help needed climbing 3-5 steps with a railing? : A Little 6 Click Score: 20    End of Session Equipment Utilized During Treatment: Gait belt Activity Tolerance: Patient limited by pain Patient left: in chair;with call bell/phone within reach Nurse Communication: Mobility status PT Visit Diagnosis: Other abnormalities of gait and mobility (R26.89);Pain Pain - Right/Left: Left Pain - part of body: Knee     Time: 1130-1150 PT Time Calculation (min) (ACUTE ONLY): 20 min  Charges:  $Gait Training: 8-22 mins                    Mabeline Caras, PT, DPT Acute Rehabilitation Services  Pager [phone] Office [place] 05/10/2019, 12:51 PM

## 2019-05-10 NOTE — Progress Notes (Addendum)
     Subjective: 3 Days Post-Op Procedure(s) (LRB): LEFT TOTAL KNEE ARTHROPLASTY (Left)  Awake, alert and oriented x 4. Left knee with moderate swelling. Pain improved with use of ACE wrap and ice. Walking with assistance in hallway but it is all he can do and is wiped out. Discharge orders completed for tomorrow. Patient reports pain as moderate.    Objective:   VITALS:  Temp:  [98.5 F (36.9 C)-100.4 F (38 C)] 99.3 F (37.4 C) (09/27 0752) Pulse Rate:  [93-108] 99 (09/27 0752) Resp:  [15-16] 15 (09/27 0752) BP: (128-158)/(76-83) 128/77 (09/27 0752) SpO2:  [93 %-96 %] 96 % (09/27 0752)  Neurologically intact ABD soft Neurovascular intact Sensation intact distally Intact pulses distally Dorsiflexion/Plantar flexion intact Incision: dressing C/D/I and scant drainage No cellulitis present Compartment soft   LABS No results for input(s): HGB, WBC, PLT in the last 72 hours. No results for input(s): NA, K, CL, CO2, BUN, CREATININE, GLUCOSE in the last 72 hours. No results for input(s): LABPT, INR in the last 72 hours.   Assessment/Plan: 3 Days Post-Op Procedure(s) (LRB): LEFT TOTAL KNEE ARTHROPLASTY (Left)  Advance diet Up with therapy Plan for discharge tomorrow Discharge home with home health Discharge orders completed for tomorrow. Basil Dess 05/10/2019, 1:30 PM Patient ID: [name], male   DOB: 1932-06-26, 85 y.o.   MRN: [id number]

## 2019-05-10 NOTE — Plan of Care (Signed)
  Problem: Pain Managment: Goal: General experience of comfort will improve Outcome: Progressing   Problem: Safety: Goal: Ability to remain free from injury will improve Outcome: Progressing   Problem: Skin Integrity: Goal: Risk for impaired skin integrity will decrease Outcome: Progressing   

## 2019-05-10 NOTE — Discharge Instructions (Addendum)
Keep knee incision dry for 5 days post op then may wet while bathing. Therapy daily and CPM goal full extension and greater than 90 degrees flexion. Call if fever or chills or increased drainage. Go to ER if acutely short of breath or call for ambulance. Return for follow up in 2 weeks. May full weight bear on the surgical leg unless told otherwise. Use knee immobilizer until able to straight leg raise off bed with knee stable. In house walking for first 2 weeks. INSTRUCTIONS AFTER JOINT REPLACEMENT   o Remove items at home which could result in a fall. This includes throw rugs or furniture in walking pathways o ICE to the affected joint every three hours while awake for 30 minutes at a time, for at least the first 3-5 days, and then as needed for pain and swelling.  Continue to use ice for pain and swelling. You may notice swelling that will progress down to the foot and ankle.  This is normal after surgery.  Elevate your leg when you are not up walking on it.   o Continue to use the breathing machine you got in the hospital (incentive spirometer) which will help keep your temperature down.  It is common for your temperature to cycle up and down following surgery, especially at night when you are not up moving around and exerting yourself.  The breathing machine keeps your lungs expanded and your temperature down.   DIET:  As you were doing prior to hospitalization, we recommend a well-balanced diet.  DRESSING / WOUND CARE / SHOWERING  Keep the surgical dressing until follow up.  The dressing is water proof, so you can shower without any extra covering.  IF THE DRESSING FALLS OFF or the wound gets wet inside, change the dressing with sterile gauze.  Please use good hand washing techniques before changing the dressing.  Do not use any lotions or creams on the incision until instructed by your surgeon.    ACTIVITY  o Increase activity slowly as tolerated, but follow the weight bearing  instructions below.   o No driving for 6 weeks or until further direction given by your physician.  You cannot drive while taking narcotics.  o No lifting or carrying greater than 10 lbs. until further directed by your surgeon. o Avoid periods of inactivity such as sitting longer than an hour when not asleep. This helps prevent blood clots.  o You may return to work once you are authorized by your doctor.     WEIGHT BEARING   Weight bearing as tolerated with assist device (walker, cane, etc) as directed, use it as long as suggested by your surgeon or therapist, typically at least 4-6 weeks.   EXERCISES  Results after joint replacement surgery are often greatly improved when you follow the exercise, range of motion and muscle strengthening exercises prescribed by your doctor. Safety measures are also important to protect the joint from further injury. Any time any of these exercises cause you to have increased pain or swelling, decrease what you are doing until you are comfortable again and then slowly increase them. If you have problems or questions, call your caregiver or physical therapist for advice.   Rehabilitation is important following a joint replacement. After just a few days of immobilization, the muscles of the leg can become weakened and shrink (atrophy).  These exercises are designed to build up the tone and strength of the thigh and leg muscles and to improve motion. Often  times heat used for twenty to thirty minutes before working out will loosen up your tissues and help with improving the range of motion but do not use heat for the first two weeks following surgery (sometimes heat can increase post-operative swelling).   These exercises can be done on a training (exercise) mat, on the floor, on a table or on a bed. Use whatever works the best and is most comfortable for you.    Use music or television while you are exercising so that the exercises are a pleasant break in your day.  This will make your life better with the exercises acting as a break in your routine that you can look forward to.   Perform all exercises about fifteen times, three times per day or as directed.  You should exercise both the operative leg and the other leg as well.  Exercises include:    Quad Sets - Tighten up the muscle on the front of the thigh (Quad) and hold for 5-10 seconds.    Straight Leg Raises - With your knee straight (if you were given a brace, keep it on), lift the leg to 60 degrees, hold for 3 seconds, and slowly lower the leg.  Perform this exercise against resistance later as your leg gets stronger.   Leg Slides: Lying on your back, slowly slide your foot toward your buttocks, bending your knee up off the floor (only go as far as is comfortable). Then slowly slide your foot back down until your leg is flat on the floor again.   Angel Wings: Lying on your back spread your legs to the side as far apart as you can without causing discomfort.   Hamstring Strength:  Lying on your back, push your heel against the floor with your leg straight by tightening up the muscles of your buttocks.  Repeat, but this time bend your knee to a comfortable angle, and push your heel against the floor.  You may put a pillow under the heel to make it more comfortable if necessary.   A rehabilitation program following joint replacement surgery can speed recovery and prevent re-injury in the future due to weakened muscles. Contact your doctor or a physical therapist for more information on knee rehabilitation.    CONSTIPATION  Constipation is defined medically as fewer than three stools per week and severe constipation as less than one stool per week.  Even if you have a regular bowel pattern at home, your normal regimen is likely to be disrupted due to multiple reasons following surgery.  Combination of anesthesia, postoperative narcotics, change in appetite and fluid intake all can affect your bowels.    YOU MUST use at least one of the following options; they are listed in order of increasing strength to get the job done.  They are all available over the counter, and you may need to use some, POSSIBLY even all of these options:    Drink plenty of fluids (prune juice may be helpful) and high fiber foods Colace 100 mg by mouth twice a day  Senokot for constipation as directed and as needed Dulcolax (bisacodyl), take with full glass of water  Miralax (polyethylene glycol) once or twice a day as needed.  If you have tried all these things and are unable to have a bowel movement in the first 3-4 days after surgery call either your surgeon or your primary doctor.    If you experience loose stools or diarrhea, hold the medications until you stool  forms back up.  If your symptoms do not get better within 1 week or if they get worse, check with your doctor.  If you experience "the worst abdominal pain ever" or develop nausea or vomiting, please contact the office immediately for further recommendations for treatment.   ITCHING:  If you experience itching with your medications, try taking only a single pain pill, or even half a pain pill at a time.  You can also use Benadryl over the counter for itching or also to help with sleep.   TED HOSE STOCKINGS:  Use stockings on both legs until for at least 2 weeks or as directed by physician office. They may be removed at night for sleeping.  MEDICATIONS:  See your medication summary on the After Visit Summary that nursing will review with you.  You may have some home medications which will be placed on hold until you complete the course of blood thinner medication.  It is important for you to complete the blood thinner medication as prescribed.  PRECAUTIONS:  If you experience chest pain or shortness of breath - call 911 immediately for transfer to the hospital emergency department.   If you develop a fever greater that 101 F, purulent drainage from wound,  increased redness or drainage from wound, foul odor from the wound/dressing, or calf pain - CONTACT YOUR SURGEON.                                                   FOLLOW-UP APPOINTMENTS:  If you do not already have a post-op appointment, please call the office for an appointment to be seen by your surgeon.  Guidelines for how soon to be seen are listed in your After Visit Summary, but are typically between 1-4 weeks after surgery.  OTHER INSTRUCTIONS:   Knee Replacement:  Do not place pillow under knee, focus on keeping the knee straight while resting. CPM instructions: 0-90 degrees, 2 hours in the morning, 2 hours in the afternoon, and 2 hours in the evening. Place foam block, curve side up under heel at all times except when in CPM or when walking.  DO NOT modify, tear, cut, or change the foam block in any way.  MAKE SURE YOU:   Understand these instructions.   Get help right away if you are not doing well or get worse.    Thank you for letting us be a part of your medical care team.  It is a privilege we respect greatly.  We hope these instructions will help you stay on track for a fast and full recovery!

## 2019-05-11 ENCOUNTER — Other Ambulatory Visit: Payer: Self-pay | Admitting: Radiology

## 2019-05-11 ENCOUNTER — Telehealth: Payer: Self-pay | Admitting: *Deleted

## 2019-05-11 NOTE — Telephone Encounter (Signed)
Pt was on TCM report admitted 05/08/19 for left total knee arthroplasty. Pt tolerated procedure well, and was D/C 05/09/19  to skilled nursing facility for PT. Pt will follow=up w/surgeon once discharge from skilled nursing.Marland KitchenJohny Chess

## 2019-05-11 NOTE — Plan of Care (Signed)
  Problem: Pain Managment: Goal: General experience of comfort will improve Outcome: Progressing   Problem: Safety: Goal: Ability to remain free from injury will improve Outcome: Progressing   Problem: Skin Integrity: Goal: Risk for impaired skin integrity will decrease Outcome: Progressing   

## 2019-05-11 NOTE — Progress Notes (Signed)
A discharge packet printed and was given to the patient.  Discharge instructions reviewed with the patient.  The patient verbalizes understanding those discharge instructions, medications and the follow up.  The patient reports that his needed equipment will be delivered to his home today.  The patient will be discharged to home with family.

## 2019-05-12 ENCOUNTER — Telehealth: Payer: Self-pay | Admitting: *Deleted

## 2019-05-12 DIAGNOSIS — Z85820 Personal history of malignant melanoma of skin: Secondary | ICD-10-CM | POA: Diagnosis not present

## 2019-05-12 DIAGNOSIS — Z471 Aftercare following joint replacement surgery: Secondary | ICD-10-CM | POA: Diagnosis not present

## 2019-05-12 DIAGNOSIS — N400 Enlarged prostate without lower urinary tract symptoms: Secondary | ICD-10-CM | POA: Diagnosis not present

## 2019-05-12 DIAGNOSIS — E785 Hyperlipidemia, unspecified: Secondary | ICD-10-CM | POA: Diagnosis not present

## 2019-05-12 DIAGNOSIS — M109 Gout, unspecified: Secondary | ICD-10-CM | POA: Diagnosis not present

## 2019-05-12 DIAGNOSIS — M19012 Primary osteoarthritis, left shoulder: Secondary | ICD-10-CM | POA: Diagnosis not present

## 2019-05-12 DIAGNOSIS — G43909 Migraine, unspecified, not intractable, without status migrainosus: Secondary | ICD-10-CM | POA: Diagnosis not present

## 2019-05-12 DIAGNOSIS — Z86718 Personal history of other venous thrombosis and embolism: Secondary | ICD-10-CM | POA: Diagnosis not present

## 2019-05-12 DIAGNOSIS — I110 Hypertensive heart disease with heart failure: Secondary | ICD-10-CM | POA: Diagnosis not present

## 2019-05-12 DIAGNOSIS — N529 Male erectile dysfunction, unspecified: Secondary | ICD-10-CM | POA: Diagnosis not present

## 2019-05-12 DIAGNOSIS — M542 Cervicalgia: Secondary | ICD-10-CM | POA: Diagnosis not present

## 2019-05-12 DIAGNOSIS — Z96653 Presence of artificial knee joint, bilateral: Secondary | ICD-10-CM | POA: Diagnosis not present

## 2019-05-12 DIAGNOSIS — M545 Low back pain: Secondary | ICD-10-CM | POA: Diagnosis not present

## 2019-05-12 DIAGNOSIS — J309 Allergic rhinitis, unspecified: Secondary | ICD-10-CM | POA: Diagnosis not present

## 2019-05-12 DIAGNOSIS — D509 Iron deficiency anemia, unspecified: Secondary | ICD-10-CM | POA: Diagnosis not present

## 2019-05-12 DIAGNOSIS — M5410 Radiculopathy, site unspecified: Secondary | ICD-10-CM | POA: Diagnosis not present

## 2019-05-12 DIAGNOSIS — I5031 Acute diastolic (congestive) heart failure: Secondary | ICD-10-CM | POA: Diagnosis not present

## 2019-05-12 DIAGNOSIS — K222 Esophageal obstruction: Secondary | ICD-10-CM | POA: Diagnosis not present

## 2019-05-12 DIAGNOSIS — I2510 Atherosclerotic heart disease of native coronary artery without angina pectoris: Secondary | ICD-10-CM | POA: Diagnosis not present

## 2019-05-12 DIAGNOSIS — Z9181 History of falling: Secondary | ICD-10-CM | POA: Diagnosis not present

## 2019-05-12 DIAGNOSIS — G5601 Carpal tunnel syndrome, right upper limb: Secondary | ICD-10-CM | POA: Diagnosis not present

## 2019-05-12 DIAGNOSIS — M19071 Primary osteoarthritis, right ankle and foot: Secondary | ICD-10-CM | POA: Diagnosis not present

## 2019-05-12 DIAGNOSIS — Z87891 Personal history of nicotine dependence: Secondary | ICD-10-CM | POA: Diagnosis not present

## 2019-05-12 NOTE — Telephone Encounter (Signed)
D/C Ortho bundle call completed. 

## 2019-05-12 NOTE — Care Plan (Signed)
RNCM contacted patient to check status for Ortho bundle D/C phone call. Patient discharged from hospital to home on 05/11/2019. Patient did receive his home CPM today through [facility]. He also verbalized that he received his first [facility] visit today. Discussed prescribed Oxycodone per [facility]. Patient is aware that he is receiving as well from Dr. Lollie Sails office, Oxycodone 10 mg every 6 hours, which has been prescribed for several years. Will discuss with Dr. Marlou Sa that patient should continue with his prescribed Oxycodone 10 mg as prescribed by Dr. Marlou Sa to help with pain after surgery. Should not receive any other prescriptions for pain from any other offices while under Dr. Randel Pigg care. RNCM will assist as needed for coordinating refills through his MD when needed. Discussed pain management as well with HHPT today. Overall, patient states he is doing well. Reviewed phone number for NCM as needed for further questions. RNCM will also set patient up for OPPT as appropriate. Will continue to monitor for CM needs.

## 2019-05-14 DIAGNOSIS — I110 Hypertensive heart disease with heart failure: Secondary | ICD-10-CM | POA: Diagnosis not present

## 2019-05-14 DIAGNOSIS — I5031 Acute diastolic (congestive) heart failure: Secondary | ICD-10-CM | POA: Diagnosis not present

## 2019-05-14 DIAGNOSIS — Z471 Aftercare following joint replacement surgery: Secondary | ICD-10-CM | POA: Diagnosis not present

## 2019-05-14 DIAGNOSIS — E785 Hyperlipidemia, unspecified: Secondary | ICD-10-CM | POA: Diagnosis not present

## 2019-05-14 DIAGNOSIS — N400 Enlarged prostate without lower urinary tract symptoms: Secondary | ICD-10-CM | POA: Diagnosis not present

## 2019-05-14 DIAGNOSIS — I2510 Atherosclerotic heart disease of native coronary artery without angina pectoris: Secondary | ICD-10-CM | POA: Diagnosis not present

## 2019-05-15 ENCOUNTER — Telehealth: Payer: Self-pay | Admitting: *Deleted

## 2019-05-15 DIAGNOSIS — I2510 Atherosclerotic heart disease of native coronary artery without angina pectoris: Secondary | ICD-10-CM | POA: Diagnosis not present

## 2019-05-15 DIAGNOSIS — E785 Hyperlipidemia, unspecified: Secondary | ICD-10-CM | POA: Diagnosis not present

## 2019-05-15 DIAGNOSIS — N400 Enlarged prostate without lower urinary tract symptoms: Secondary | ICD-10-CM | POA: Diagnosis not present

## 2019-05-15 DIAGNOSIS — I110 Hypertensive heart disease with heart failure: Secondary | ICD-10-CM | POA: Diagnosis not present

## 2019-05-15 DIAGNOSIS — Z471 Aftercare following joint replacement surgery: Secondary | ICD-10-CM | POA: Diagnosis not present

## 2019-05-15 DIAGNOSIS — I5031 Acute diastolic (congestive) heart failure: Secondary | ICD-10-CM | POA: Diagnosis not present

## 2019-05-15 NOTE — Telephone Encounter (Signed)
Ortho bundle 7 day post op call completed. ?

## 2019-05-15 NOTE — Care Plan (Signed)
RNCM contacted patient for 7 day post-op call. Patient states he is doing very well. Working with therapy and they will be coming back tomorrow. Doesn't know how far he can flex his knee, but feels he is doing well. Discussed pain management and Oxycodone prescribed for his knee vs. Back (per Dr. Rita Ohara). Will discuss with Dr. Marlou Sa on Monday, 05/18/19 at his post-op appointment. Will continue to assess for CM needs.

## 2019-05-16 DIAGNOSIS — E785 Hyperlipidemia, unspecified: Secondary | ICD-10-CM | POA: Diagnosis not present

## 2019-05-16 DIAGNOSIS — Z471 Aftercare following joint replacement surgery: Secondary | ICD-10-CM | POA: Diagnosis not present

## 2019-05-16 DIAGNOSIS — I2510 Atherosclerotic heart disease of native coronary artery without angina pectoris: Secondary | ICD-10-CM | POA: Diagnosis not present

## 2019-05-16 DIAGNOSIS — N400 Enlarged prostate without lower urinary tract symptoms: Secondary | ICD-10-CM | POA: Diagnosis not present

## 2019-05-16 DIAGNOSIS — I5031 Acute diastolic (congestive) heart failure: Secondary | ICD-10-CM | POA: Diagnosis not present

## 2019-05-16 DIAGNOSIS — I110 Hypertensive heart disease with heart failure: Secondary | ICD-10-CM | POA: Diagnosis not present

## 2019-05-17 NOTE — Discharge Summary (Signed)
Physician Discharge Summary      Patient ID: [name] MRN: [id number] DOB/AGE: [birth year] 84 y.o.  Admit date: 05/07/2019 Discharge date: 05/11/2019  Admission Diagnoses:  Active Problems:   Arthritis of left knee   Discharge Diagnoses:  Same  Surgeries: Procedure(s): LEFT TOTAL KNEE ARTHROPLASTY on 05/07/2019   Consultants:   Discharged Condition: Stable  Hospital Course: [name] is an 85 y.o. male who was admitted 05/07/2019 with a chief complaint of left knee pain, and found to have a diagnosis of left knee arthritis.  They were brought to the operating room on 05/07/2019 and underwent the above named procedures.  Pt awoke from anesthesia without complication and was transferred to the floor. On POD1, patient's pain was mostly controlled while laying in bed but was unable to tolerate any out of bed PT.  His pain steadily improved over the course of POD2 and POD3.  As pain improved, his mobility progressed as well.  He was discharged home on POD4.  Pt will f/u with Dr. Marlou Sa in clinic in ~2 weeks.   Antibiotics given:  Anti-infectives (From admission, onward)   Start     Dose/Rate Route Frequency Ordered Stop   05/07/19 1300  ceFAZolin (ANCEF) IVPB 2g/100 mL premix     2 g 200 mL/hr over 30 Minutes Intravenous Every 6 hours 05/07/19 1258 05/07/19 1900   05/07/19 0600  ceFAZolin (ANCEF) IVPB 2g/100 mL premix     2 g 200 mL/hr over 30 Minutes Intravenous On call to O.R. 05/07/19 0549 05/07/19 0802   05/07/19 0555  ceFAZolin (ANCEF) 2-4 GM/100ML-% IVPB    Note to Pharmacy: Alvy Beal   : cabinet override      05/07/19 0555 05/07/19 0732    .  Recent vital signs:  Vitals:   05/11/19 0317 05/11/19 0850  BP: (!) 150/77 136/66  Pulse: 80 95  Resp:  18  Temp: 97.7 F (36.5 C) 97.9 F (36.6 C)  SpO2: 95% 96%    Recent laboratory studies:  Results for orders placed or performed during the hospital encounter of 05/04/19  Novel Coronavirus, NAA (Hosp order,  Send-out to Ref Lab; TAT 18-24 hrs   Specimen: Nasopharyngeal Swab; Respiratory  Result Value Ref Range   SARS-CoV-2, NAA NOT DETECTED NOT DETECTED   Coronavirus Source NASOPHARYNGEAL     Discharge Medications:   Allergies as of 05/11/2019      Reactions   Amlodipine    Ankle swelling   Gabapentin    Lower extremity edema   Lyrica [pregabalin] Swelling      Medication List    STOP taking these medications   furosemide 20 MG tablet Commonly known as: Lasix     TAKE these medications   amLODipine 10 MG tablet Commonly known as: NORVASC TAKE 1 TABLET BY MOUTH EVERY DAY   aspirin 81 MG chewable tablet Chew 1 tablet (81 mg total) by mouth 2 (two) times daily.   carvedilol 25 MG tablet Commonly known as: COREG TAKE 1 TABLET BY MOUTH 2 TIMES DAILY WITH A MEAL What changed: See the new instructions.   [facility] VITAMIN B12 1000 MCG tablet Generic drug: cyanocobalamin TAKE 1 TABLET (1,000 MCG TOTAL) BY MOUTH DAILY. What changed: See the new instructions.   finasteride 5 MG tablet Commonly known as: PROSCAR TAKE 1 TABLET BY MOUTH EVERY DAY What changed: when to take this   fluticasone 50 MCG/ACT nasal spray Commonly known as: FLONASE Place 1 spray into both nostrils daily as  needed for allergies.   hydrALAZINE 50 MG tablet Commonly known as: APRESOLINE Take 1 tablet (50 mg total) by mouth every 8 (eight) hours.   Magnesium 250 MG Tabs Take 1 tablet (250 mg total) by mouth daily. 1500 mg per day is too much contact your primary care MD if you are concerned about this lower dose What changed:   how much to take  additional instructions   methocarbamol 500 MG tablet Commonly known as: ROBAXIN Take 1 tablet (500 mg total) by mouth every 8 (eight) hours as needed for muscle spasms.   Oxycodone HCl 10 MG Tabs Take 1 tablet (10 mg total) by mouth every 4 (four) hours as needed for moderate pain (pain score 4-6). What changed:   when to take this  reasons to take  this   oxyCODONE 10 mg 12 hr tablet Commonly known as: OXYCONTIN Take 1 tablet (10 mg total) by mouth every 12 (twelve) hours for 7 days. What changed: You were already taking a medication with the same name, and this prescription was added. Make sure you understand how and when to take each.   predniSONE 20 MG tablet Commonly known as: DELTASONE Take 2 pills daily for 1 week, then take 1 pill daily for 1 week, then resume separate 10 mg daily dosing. What changed: Another medication with the same name was removed. Continue taking this medication, and follow the directions you see here.   spironolactone 25 MG tablet Commonly known as: ALDACTONE TAKE 1 TABLET BY MOUTH EVERY DAY   tamsulosin 0.4 MG Caps capsule Commonly known as: FLOMAX TAKE 1 CAPSULE (0.4 MG TOTAL) BY MOUTH DAILY. What changed:   how much to take  how to take this  when to take this  additional instructions   triamcinolone cream 0.5 % Commonly known as: KENALOG USE AS DIRECTED What changed:   how much to take  how to take this  when to take this  reasons to take this       Diagnostic Studies: Xr Knee 1-2 Views Left  Result Date: 04/22/2019 AP lateral left knee reviewed.  Tricompartmental end-stage arthritis noted in that left knee.  Right total knee prosthesis in good position alignment.  No acute fracture or dislocation.  Vas [place] Le Art Seg Multi (segm&le Reynauds)  Result Date: 04/28/2019 LOWER EXTREMITY DOPPLER STUDY Indications: Doctor couldn't feel left pedal pulse. Patient is scheduled for              left knee replacement 05/07/2019. Patient has no complaints other              than left knee pain and bilateral leg swelling. High Risk Factors: Hypertension, hyperlipidemia, past history of smoking,                    coronary artery disease.  Performing Technologist: Salvadore Dom RVT, RDCS (AE), RDMS  Examination Guidelines: A complete evaluation includes at minimum, Doppler waveform signals and  systolic blood pressure reading at the level of bilateral brachial, anterior tibial, and posterior tibial arteries, when vessel segments are accessible. Bilateral testing is considered an integral part of a complete examination. Photoelectric Plethysmograph (PPG) waveforms and toe systolic pressure readings are included as required and additional duplex testing as needed. Limited examinations for reoccurring indications may be performed as noted.  ABI Findings: +---------+------------------+-----+---------+--------+  Right     Rt Pressure (mmHg) Index Waveform  Comment   +---------+------------------+-----+---------+--------+  Brachial  141                                          +---------+------------------+-----+---------+--------+  CFA                                triphasic           +---------+------------------+-----+---------+--------+  Popliteal                          triphasic           +---------+------------------+-----+---------+--------+  ATA       177                1.23  triphasic           +---------+------------------+-----+---------+--------+  PTA       180                1.25  triphasic           +---------+------------------+-----+---------+--------+  PERO      171                1.19  triphasic           +---------+------------------+-----+---------+--------+  Great Toe 103                0.72  Normal              +---------+------------------+-----+---------+--------+ +---------+------------------+-----+---------+-------+  Left      Lt Pressure (mmHg) Index Waveform  Comment  +---------+------------------+-----+---------+-------+  Brachial  144                                         +---------+------------------+-----+---------+-------+  CFA                                triphasic          +---------+------------------+-----+---------+-------+  Popliteal                          triphasic          +---------+------------------+-----+---------+-------+  ATA       163                1.13   triphasic          +---------+------------------+-----+---------+-------+  PTA       174                1.21  triphasic          +---------+------------------+-----+---------+-------+  PERO      168                1.17  triphasic          +---------+------------------+-----+---------+-------+  Great Toe 88                 0.61  Abnormal           +---------+------------------+-----+---------+-------+ +-------+-----------+-----------+------------+------------+  ABI/TBI Today's ABI Today's TBI Previous ABI Previous TBI  +-------+-----------+-----------+------------+------------+  Right   1.25        .72                                    +-------+-----------+-----------+------------+------------+  Left    1.21        .61                                    +-------+-----------+-----------+------------+------------+  Summary: Right: Resting right ankle-brachial index is within normal range. No evidence of significant right lower extremity arterial disease. The right toe-brachial index is normal. Left: Resting left ankle-brachial index is within normal range. No evidence of significant left lower extremity arterial disease. The left toe-brachial index is abnormal.  *See table(s) above for measurements and observations.  Electronically signed by Jenkins Rouge MD on 04/28/2019 at 4:05:05 PM.    Final     Disposition:   Discharge Instructions    Call MD / Call 911   Complete by: As directed    If you experience chest pain or shortness of breath, CALL 911 and be transported to the hospital emergency room.  If you develope a fever above 101 F, pus (white drainage) or increased drainage or redness at the wound, or calf pain, call your surgeon's office.   Call MD / Call 911   Complete by: As directed    If you experience chest pain or shortness of breath, CALL 911 and be transported to the hospital emergency room.  If you develope a fever above 101 F, pus (white drainage) or increased drainage or redness at the wound,  or calf pain, call your surgeon's office.   Constipation Prevention   Complete by: As directed    Drink plenty of fluids.  Prune juice may be helpful.  You may use a stool softener, such as Colace (over the counter) 100 mg twice a day.  Use MiraLax (over the counter) for constipation as needed.   Constipation Prevention   Complete by: As directed    Drink plenty of fluids.  Prune juice may be helpful.  You may use a stool softener, such as Colace (over the counter) 100 mg twice a day.  Use MiraLax (over the counter) for constipation as needed.   Diet - low sodium heart healthy   Complete by: As directed    Discharge instructions   Complete by: As directed    You may shower, dressing is waterproof.  Do not remove the dressing, we will remove it at your first post-op appointment.  Do not take a bath or soak the knee in a tub or pool.  You may weightbear as you can tolerate on the operative leg with a walker.  Continue using the CPM machine 3 times per day for at least one hour each time, increasing the degrees of range of motion daily.  Use the blue cradle boot under your heel to work on getting your leg straight.  Do NOT put a pillow under your knee.  You will follow-up with Dr. Marlou Sa in the clinic in 1-2 weeks at your given appointment date.   Discharge instructions   Complete by: As directed    Keep knee incision dry for 5 days post op then may wet while bathing. Therapy daily and CPM goal full extension and greater than 90 degrees flexion. Call if fever or chills or increased drainage. Go to ER if acutely short of breath or call for ambulance. Return for follow up in 2 weeks. May full weight bear on the surgical leg unless told otherwise. Use knee immobilizer until able to straight leg raise off bed with knee stable. In house walking for first 2 weeks. INSTRUCTIONS AFTER JOINT REPLACEMENT   Remove items at home which could result in a fall. This includes throw rugs or furniture in walking  pathways ICE to the affected joint every three hours while awake for 30 minutes at a time, for at  least the first 3-5 days, and then as needed for pain and swelling.  Continue to use ice for pain and swelling. You may notice swelling that will progress down to the foot and ankle.  This is normal after surgery.  Elevate your leg when you are not up walking on it.   Continue to use the breathing machine you got in the hospital (incentive spirometer) which will help keep your temperature down.  It is common for your temperature to cycle up and down following surgery, especially at night when you are not up moving around and exerting yourself.  The breathing machine keeps your lungs expanded and your temperature down.   DIET:  As you were doing prior to hospitalization, we recommend a well-balanced diet.  DRESSING / WOUND CARE / SHOWERING  Keep the surgical dressing until follow up.  The dressing is water proof, so you can shower without any extra covering.  IF THE DRESSING FALLS OFF or the wound gets wet inside, change the dressing with sterile gauze.  Please use good hand washing techniques before changing the dressing.  Do not use any lotions or creams on the incision until instructed by your surgeon.    ACTIVITY  Increase activity slowly as tolerated, but follow the weight bearing instructions below.   No driving for 6 weeks or until further direction given by your physician.  You cannot drive while taking narcotics.  No lifting or carrying greater than 10 lbs. until further directed by your surgeon. Avoid periods of inactivity such as sitting longer than an hour when not asleep. This helps prevent blood clots.  You may return to work once you are authorized by your doctor.     WEIGHT BEARING   Weight bearing as tolerated with assist device (walker, cane, etc) as directed, use it as long as suggested by your surgeon or therapist, typically at least 4-6 weeks.   EXERCISES  Results after  joint replacement surgery are often greatly improved when you follow the exercise, range of motion and muscle strengthening exercises prescribed by your doctor. Safety measures are also important to protect the joint from further injury. Any time any of these exercises cause you to have increased pain or swelling, decrease what you are doing until you are comfortable again and then slowly increase them. If you have problems or questions, call your caregiver or physical therapist for advice.   Rehabilitation is important following a joint replacement. After just a few days of immobilization, the muscles of the leg can become weakened and shrink (atrophy).  These exercises are designed to build up the tone and strength of the thigh and leg muscles and to improve motion. Often times heat used for twenty to thirty minutes before working out will loosen up your tissues and help with improving the range of motion but do not use heat for the first two weeks following surgery (sometimes heat can increase post-operative swelling).   These exercises can be done on a training (exercise) mat, on the floor, on a table or on a bed. Use whatever works the best and is most comfortable for you.    Use music or television while you are exercising so that the exercises are a pleasant break in your day. This will make your life better with the exercises acting as a break in your routine that you can look forward to.   Perform all exercises about fifteen times, three times per day or as directed.  You should exercise both  the operative leg and the other leg as well.  Exercises include:   Quad Sets - Tighten up the muscle on the front of the thigh (Quad) and hold for 5-10 seconds.   Straight Leg Raises - With your knee straight (if you were given a brace, keep it on), lift the leg to 60 degrees, hold for 3 seconds, and slowly lower the leg.  Perform this exercise against resistance later as your leg gets stronger.  Leg Slides:  Lying on your back, slowly slide your foot toward your buttocks, bending your knee up off the floor (only go as far as is comfortable). Then slowly slide your foot back down until your leg is flat on the floor again.  Angel Wings: Lying on your back spread your legs to the side as far apart as you can without causing discomfort.  Hamstring Strength:  Lying on your back, push your heel against the floor with your leg straight by tightening up the muscles of your buttocks.  Repeat, but this time bend your knee to a comfortable angle, and push your heel against the floor.  You may put a pillow under the heel to make it more comfortable if necessary.   A rehabilitation program following joint replacement surgery can speed recovery and prevent re-injury in the future due to weakened muscles. Contact your doctor or a physical therapist for more information on knee rehabilitation.    CONSTIPATION  Constipation is defined medically as fewer than three stools per week and severe constipation as less than one stool per week.  Even if you have a regular bowel pattern at home, your normal regimen is likely to be disrupted due to multiple reasons following surgery.  Combination of anesthesia, postoperative narcotics, change in appetite and fluid intake all can affect your bowels.   YOU MUST use at least one of the following options; they are listed in order of increasing strength to get the job done.  They are all available over the counter, and you may need to use some, POSSIBLY even all of these options:    Drink plenty of fluids (prune juice may be helpful) and high fiber foods Colace 100 mg by mouth twice a day  Senokot for constipation as directed and as needed Dulcolax (bisacodyl), take with full glass of water  Miralax (polyethylene glycol) once or twice a day as needed.  If you have tried all these things and are unable to have a bowel movement in the first 3-4 days after surgery call either your  surgeon or your primary doctor.    If you experience loose stools or diarrhea, hold the medications until you stool forms back up.  If your symptoms do not get better within 1 week or if they get worse, check with your doctor.  If you experience "the worst abdominal pain ever" or develop nausea or vomiting, please contact the office immediately for further recommendations for treatment.   ITCHING:  If you experience itching with your medications, try taking only a single pain pill, or even half a pain pill at a time.  You can also use Benadryl over the counter for itching or also to help with sleep.   TED HOSE STOCKINGS:  Use stockings on both legs until for at least 2 weeks or as directed by physician office. They may be removed at night for sleeping.  MEDICATIONS:  See your medication summary on the "After Visit Summary" that nursing will review with you.  You may have  some home medications which will be placed on hold until you complete the course of blood thinner medication.  It is important for you to complete the blood thinner medication as prescribed.  PRECAUTIONS:  If you experience chest pain or shortness of breath - call 911 immediately for transfer to the hospital emergency department.   If you develop a fever greater that 101 F, purulent drainage from wound, increased redness or drainage from wound, foul odor from the wound/dressing, or calf pain - CONTACT YOUR SURGEON.                                                   FOLLOW-UP APPOINTMENTS:  If you do not already have a post-op appointment, please call the office for an appointment to be seen by your surgeon.  Guidelines for how soon to be seen are listed in your "After Visit Summary", but are typically between 1-4 weeks after surgery.  OTHER INSTRUCTIONS:   Knee Replacement:  Do not place pillow under knee, focus on keeping the knee straight while resting. CPM instructions: 0-90 degrees, 2 hours in the morning, 2 hours in the  afternoon, and 2 hours in the evening. Place foam block, curve side up under heel at all times except when in CPM or when walking.  DO NOT modify, tear, cut, or change the foam block in any way.  MAKE SURE YOU:  Understand these instructions.  Get help right away if you are not doing well or get worse.    Thank you for letting us be a part of your medical care team.  It is a privilege we respect greatly.  We hope these instructions will help you stay on track for a fast and full recovery!   Driving restrictions   Complete by: As directed    No driving for 6 weeks   Increase activity slowly as tolerated   Complete by: As directed    Increase activity slowly as tolerated   Complete by: As directed    Lifting restrictions   Complete by: As directed    No lifting for 12 weeks      Follow-up Information    Marlou Sa, Tonna Corner, MD. Go on 05/18/2019.   Specialty: Orthopedic Surgery Why: at 2:30 pm for 10 day follow up in office. Contact information: [place] [address] [phone]        [facility] Follow up.   Specialty: Home Health Services Why: You have been authorized for [facility] visits. Someone from the agency will be in contact with you after discharge to set up a time for your initial visit. Contact information: [address] [place] [address] [phone]        [facility] Follow up.   Why: Rolling Walker & continuous passive motion machine Contact information: [place],  [address]  [phone]           Signed: Donella Stade 05/17/2019, 6:46 PM

## 2019-05-18 ENCOUNTER — Other Ambulatory Visit (INDEPENDENT_AMBULATORY_CARE_PROVIDER_SITE_OTHER): Payer: Self-pay | Admitting: Specialist

## 2019-05-18 ENCOUNTER — Ambulatory Visit: Payer: Self-pay

## 2019-05-18 ENCOUNTER — Telehealth: Payer: Self-pay | Admitting: *Deleted

## 2019-05-18 ENCOUNTER — Encounter: Payer: Self-pay | Admitting: Orthopedic Surgery

## 2019-05-18 ENCOUNTER — Ambulatory Visit (INDEPENDENT_AMBULATORY_CARE_PROVIDER_SITE_OTHER): Payer: Medicare Other | Admitting: Orthopedic Surgery

## 2019-05-18 DIAGNOSIS — Z96652 Presence of left artificial knee joint: Secondary | ICD-10-CM

## 2019-05-18 NOTE — Telephone Encounter (Signed)
Dr Dean patient

## 2019-05-18 NOTE — Telephone Encounter (Signed)
2 week ortho bundle call (In office appointment) completed.

## 2019-05-18 NOTE — Progress Notes (Signed)
Post-Op Visit Note   Patient: [name]           Date of Birth: 1931/09/24           MRN: [id number] Visit Date: 05/18/2019 PCP: Hoyt Koch, MD   Assessment & Plan:  Chief Complaint:  Chief Complaint  Patient presents with  . Left Knee - Routine Post Op   Visit Diagnoses:  1. S/P total knee arthroplasty, left     Plan: Patient is an 85 year old male who presents s/p left knee replacement on 05/07/2019.  Patient states that he is doing okay.  He is ambulating without a walker or cane.  He notes pain only when doing exercises.  He is taking oxycodone 10 mg every 6 hours, as he is prescribed by Dr. Sherwood Gambler.  He has 1 session of home health physical therapy remaining and then he will transition to outpatient physical therapy at [facility] physical therapy.  He has 0 to 5 degrees of extension, 95 degrees of flexion.  He is not using the CPM machine.  No calf tenderness negative Homans sign.  Incision is healing well.  X-rays look good today in the clinic, no evidence of loosening of the prosthesis or fracture.  He will transition from aspirin twice a day to aspirin once a day.  Patient will follow-up in 4 weeks.  If he runs out of pain medication prior to his next prescription by Dr. Sherwood Gambler, he will reach out to this office.  Follow-Up Instructions: No follow-ups on file.   Orders:  Orders Placed This Encounter  Procedures  . XR Knee 1-2 Views Left   No orders of the defined types were placed in this encounter.   Imaging: No results found.  PMFS History: Patient Active Problem List   Diagnosis Date Noted  . Arthritis of left knee 05/07/2019  . Pre-operative clearance 04/28/2019  . Acute diastolic CHF (congestive heart failure) ([place]) 08/10/2018  . Leg wound, left 03/19/2018  . Right arm pain 11/11/2017  . Primary osteoarthritis of right foot 08/21/2017  . Syncope 02/27/2017  . Arthritis of right ankle 12/18/2016  . Carpal tunnel syndrome, bilateral  09/04/2016  . Leg swelling 08/16/2016  . Hand weakness 07/12/2016  . Degenerative arthritis of left knee 06/28/2016  . Iron deficiency anemia 03/16/2016  . Arthritis of left acromioclavicular joint 01/03/2016  . Rotator cuff disorder 01/03/2016  . Osteoarthritis of left knee 04/30/2015  . Baker's cyst of knee 04/30/2015  . Esophageal stricture 04/26/2015  . Benign prostatic hyperplasia 02/21/2015  . Constipation 02/21/2015  . Myalgia and myositis 11/18/2014  . Polyarticular arthritis 07/19/2013  . Chronic radicular low back pain 12/15/2012  . History of gout 02/20/2010  . ERECTILE DYSFUNCTION, ORGANIC 02/20/2010  . Hyperlipidemia 10/18/2008  . CAD 08/18/2007  . Essential hypertension 05/09/2007  . ALLERGIC RHINITIS 03/27/2007   Past Medical History:  Diagnosis Date  . ACTINIC KERATOSIS, HEAD 03/23/2008  . ALLERGIC RHINITIS 03/27/2007  . Arthritis   . BENIGN PROSTATIC HYPERTROPHY, WITH URINARY OBSTRUCTION 10/16/2007  . CAD 08/18/2007   "patient denies any issues with heart, does not see cardiologist"  . Carpal tunnel syndrome, bilateral 09/04/2016  . CARPAL TUNNEL SYNDROME, LEFT 04/13/2008  . Chronic back pain   . Chronic neck pain   . DVT (deep venous thrombosis) ([place])   . ERECTILE DYSFUNCTION, ORGANIC 02/20/2010  . Gout, unspecified 02/20/2010  . Headache(784.0)    migraines hx of  . History of melanoma   . HYPERLIPIDEMIA, WITH  HIGH HDL 10/18/2008  . HYPERTENSION 05/09/2007   sees Dr. Benay Pillow  . KNEE PAIN 10/10/2009  . LOW BACK PAIN 03/27/2007  . NEOPLASM, MALIGNANT, SKIN, TRUNK 05/09/2007  . Patellar tendinitis 10/10/2009  . SKIN CANCER, HX OF 03/27/2007  . UNS ADVRS EFF UNS RX MEDICINAL&BIOLOGICAL SBSTNC 08/18/2007  . Urinary urgency     Family History  Problem Relation Age of Onset  . Dementia Mother   . Anemia Father   . Heart disease Father   . Stroke Sister        ICH  . Lung disease Sister     Past Surgical History:  Procedure Laterality Date  . APPENDECTOMY   1969  . BACK SURGERY    . BLEPHAROPLASTY    . CARPAL TUNNEL RELEASE    . CHOLECYSTECTOMY    . ESOPHAGOGASTRODUODENOSCOPY (EGD) WITH PROPOFOL N/A 04/26/2015   Procedure: ESOPHAGOGASTRODUODENOSCOPY (EGD) WITH PROPOFOL;  Surgeon: Inda Castle, MD;  Location: [facility];  Service: Endoscopy;  Laterality: N/A;  . EYE SURGERY     bilateral cataracts removed and vitrectomies  . JOINT REPLACEMENT Right    knee  . KNEE ARTHROSCOPY    . LAMINECTOMY    . LATERAL FUSION LUMBAR SPINE, TRANSVERSE    . LUMBAR LAMINECTOMY/DECOMPRESSION MICRODISCECTOMY Left 11/03/2012   Procedure: LUMBAR LAMINECTOMY/DECOMPRESSION MICRODISCECTOMY 1 LEVEL;  Surgeon: Hosie Spangle, MD;  Location: [place] [facility];  Service: Neurosurgery;  Laterality: Left;  LEFT L5S1 laminotomy foraminotomy and possible microdiskectomy  . MELANOMA EXCISION    . NASAL SINUS SURGERY    . POSTERIOR CERVICAL FUSION/FORAMINOTOMY Right 10/01/2012   Procedure: POSTERIOR CERVICAL FUSION/FORAMINOTOMY LEVEL 1;  Surgeon: Hosie Spangle, MD;  Location: [place] [facility];  Service: Neurosurgery;  Laterality: Right;  Right Cervical seven thoracic one Cervical laminectomy/foraminotomy with posterior cervical arthrodesis   . POSTERIOR LUMBAR FUSION N/A 05/23/2013   Procedure: exploration of lumbar wound, explantation of left interbody implant.;  Surgeon: Hosie Spangle, MD;  Location: [place];  Service: Neurosurgery;  Laterality: N/A;  . RECTAL SURGERY     Fissure  . TOTAL KNEE ARTHROPLASTY Left 05/07/2019   Procedure: LEFT TOTAL KNEE ARTHROPLASTY;  Surgeon: [name], MD;  Location: [place];  Service: Orthopedics;  Laterality: Left;  Marland Kitchen VITRECTOMY     right and left 2013   Social History   Occupational History  . Occupation: Retired  Tobacco Use  . Smoking status: Former Smoker    Packs/day: 1.00    Years: 33.00    Pack years: 33.00    Quit date: 09/29/1981    Years since quitting: 37.6  . Smokeless tobacco: Never Used  Substance and  Sexual Activity  . Alcohol use: Yes    Alcohol/week: 3.0 standard drinks    Types: 3 Standard drinks or equivalent per week    Comment: Social  . Drug use: No  . Sexual activity: Yes    Partners: Female    Comment: Married

## 2019-05-18 NOTE — Telephone Encounter (Signed)
Please advise. Thanks.  

## 2019-05-18 NOTE — Care Plan (Signed)
Patient seen in office today for his initial post-op with Dr. Marlou Sa. He is 11 days post-op. He ambulates without an assistive device and states he is back to doing housework, cooking, and cleaning. His left knee is swollen today and he verbalized he is using ice 3-4 times daily. He has been receiving HHPT, but states his last visit is scheduled tomorrow. RNCM has already scheduled him for OPPT with [facility] PT on Wednesday, 05/20/19 at 10:00 am. Order and Op note sent to [facility] PT today. Instructed by PA to decrease Aspirin from twice daily to once per day. He is not currently using his CPM machine. Has about 90-95 degrees flexion and almost full extension. He is concerned regarding his medication management for pain as he gets oxycodone 10 mg from Dr. Sherwood Gambler every 28 days. Dr. Marlou Sa and [facility] discussed with him. He did not fill his Oxycodone prescription at discharge from the hospital as the pharmacy would not be allowed to fill this. He has been using medication he already has from Dr. Sherwood Gambler. Taking approximately 5 tablets per day. Instructed to contact our office several days before he is out, so that something can be worked out for his pain medication management.

## 2019-05-19 DIAGNOSIS — Z471 Aftercare following joint replacement surgery: Secondary | ICD-10-CM | POA: Diagnosis not present

## 2019-05-19 DIAGNOSIS — I5031 Acute diastolic (congestive) heart failure: Secondary | ICD-10-CM | POA: Diagnosis not present

## 2019-05-19 DIAGNOSIS — I110 Hypertensive heart disease with heart failure: Secondary | ICD-10-CM | POA: Diagnosis not present

## 2019-05-19 DIAGNOSIS — I2510 Atherosclerotic heart disease of native coronary artery without angina pectoris: Secondary | ICD-10-CM | POA: Diagnosis not present

## 2019-05-19 DIAGNOSIS — E785 Hyperlipidemia, unspecified: Secondary | ICD-10-CM | POA: Diagnosis not present

## 2019-05-19 DIAGNOSIS — N400 Enlarged prostate without lower urinary tract symptoms: Secondary | ICD-10-CM | POA: Diagnosis not present

## 2019-05-20 DIAGNOSIS — M25562 Pain in left knee: Secondary | ICD-10-CM | POA: Diagnosis not present

## 2019-05-22 ENCOUNTER — Inpatient Hospital Stay: Payer: Medicare Other | Admitting: Orthopedic Surgery

## 2019-05-22 DIAGNOSIS — M25562 Pain in left knee: Secondary | ICD-10-CM | POA: Diagnosis not present

## 2019-05-26 DIAGNOSIS — M25562 Pain in left knee: Secondary | ICD-10-CM | POA: Diagnosis not present

## 2019-05-28 DIAGNOSIS — M25562 Pain in left knee: Secondary | ICD-10-CM | POA: Diagnosis not present

## 2019-05-29 ENCOUNTER — Other Ambulatory Visit: Payer: Self-pay | Admitting: Internal Medicine

## 2019-06-01 ENCOUNTER — Telehealth: Payer: Self-pay | Admitting: *Deleted

## 2019-06-01 ENCOUNTER — Other Ambulatory Visit: Payer: Self-pay | Admitting: Surgical

## 2019-06-01 DIAGNOSIS — M25562 Pain in left knee: Secondary | ICD-10-CM | POA: Diagnosis not present

## 2019-06-01 MED ORDER — OXYCODONE HCL 10 MG PO TABS: 10.0000 mg | ORAL_TABLET | ORAL | 0 refills | Status: DC | PRN

## 2019-06-01 NOTE — Telephone Encounter (Signed)
Received call from patient stating he is almost out of Oxycodone 10 mg that was filled on 05/26/19 (#42) from our office. He is under pain contract with Dr. Sherwood Gambler for his back and also receives Oxycodone 10 mg, but this has been on hold since our office has been prescribing post-surgery. He is 25 days post-op today. Please advise if you would like to continue with his pain Rx for the knee or if Dr. Sherwood Gambler should resume? It is prescribed every 6 hours, so he takes 4 per day under that Rx. Or would you like to change dosage or medication for pain altogether? Thank you-

## 2019-06-01 NOTE — Telephone Encounter (Signed)
He has been notified. I will also reach out to Dr. Donnella Bi office to let them know to take over his regular regimen as of next Monday. Thank you-

## 2019-06-01 NOTE — Telephone Encounter (Signed)
He should continue with 10 every 4 hours for total of 30 days postop and then get back onto his regular regimen with Dr. Sherwood Gambler.

## 2019-06-01 NOTE — Telephone Encounter (Signed)
Could you put this through to his pharmacy please today? Also, 30 days is Saturday 05/07/19....could we go to Monday instead of ending his meds over a weekend? Thank you-

## 2019-06-04 DIAGNOSIS — M25562 Pain in left knee: Secondary | ICD-10-CM | POA: Diagnosis not present

## 2019-06-05 ENCOUNTER — Telehealth: Payer: Self-pay | Admitting: *Deleted

## 2019-06-05 NOTE — Care Plan (Signed)
RNCM contacted Dr. Donnella Bi office where patient receives monthly Oxycodone for his back. Made Shameka aware that Dr. Marlou Sa assumed care of patient's pain medication after surgery for the last month. Last prescription for Oxycodone written on 06/01/19 for #42 tabs. Shameka made note of this and is aware that their office will now resume medication for pain. Discussed this with [name] as well. Also he noted that he feels he is done with OPPT and attended his last visit on Wednesday of this week. Doesn't feel that he needs anymore. He feels he has made good progress and is doing well.  Reviewed 30 day survey and status. TOM/THN survey provided to [facility] reviewed with patient in place of 10 question survey in [place].  1. Before surgery, I was provided sufficient education regarding my surgery and the bundle program. Patient's answer- Strongly agree 2. I was satisfied with the care provided by the nurse at the facility where my surgery was performed. Patient's answer- Strongly agree 3. Following surgery, I received sufficient postoperative care instructions. Patient's answer- Strongly agree 4. I would recommend my surgeon and this bundle program to others. Patient's answer- Strongly agree

## 2019-06-05 NOTE — Telephone Encounter (Signed)
30 day Ortho bundle call completed.

## 2019-06-09 ENCOUNTER — Encounter: Payer: Self-pay | Admitting: Internal Medicine

## 2019-06-09 MED ORDER — PREDNISONE 10 MG PO TABS: 10.0000 mg | ORAL_TABLET | Freq: Every day | ORAL | 2 refills | Status: DC

## 2019-06-11 ENCOUNTER — Encounter: Payer: Self-pay | Admitting: Family Medicine

## 2019-06-18 ENCOUNTER — Other Ambulatory Visit (INDEPENDENT_AMBULATORY_CARE_PROVIDER_SITE_OTHER): Payer: Self-pay | Admitting: Surgical

## 2019-06-18 NOTE — Telephone Encounter (Signed)
Ok to rf? 

## 2019-06-18 NOTE — Telephone Encounter (Signed)
This is a GD pt 

## 2019-06-19 ENCOUNTER — Other Ambulatory Visit: Payer: Self-pay

## 2019-06-19 ENCOUNTER — Encounter: Payer: Self-pay | Admitting: Orthopedic Surgery

## 2019-06-19 ENCOUNTER — Ambulatory Visit (INDEPENDENT_AMBULATORY_CARE_PROVIDER_SITE_OTHER): Payer: Medicare Other | Admitting: Orthopedic Surgery

## 2019-06-19 DIAGNOSIS — Z96652 Presence of left artificial knee joint: Secondary | ICD-10-CM

## 2019-06-19 NOTE — Progress Notes (Signed)
Post-Op Visit Note   Patient: [name]           Date of Birth: 1932-01-17           MRN: [id number] Visit Date: 06/19/2019 PCP: Hoyt Koch, MD   Assessment & Plan:  Chief Complaint:  Chief Complaint  Patient presents with  . Follow-up   Visit Diagnoses:  1. S/P total knee arthroplasty, left     Plan: [name] is a patient is now 6 weeks out left total knee replacement.  He is doing well.  On exam he has excellent range of motion full flexion minus about 15 degrees and full extension.  Incision is intact.  He stopped physical therapy.  He is able to do everything he needs to do for his wife.  Overall clots done extremely well with his total knee replacement.  Continue with activity as tolerated and follow-up as needed  Follow-Up Instructions: Return if symptoms worsen or fail to improve.   Orders:  No orders of the defined types were placed in this encounter.  No orders of the defined types were placed in this encounter.   Imaging: No results found.  PMFS History: Patient Active Problem List   Diagnosis Date Noted  . Arthritis of left knee 05/07/2019  . Pre-operative clearance 04/28/2019  . Acute diastolic CHF (congestive heart failure) ([place]) 08/10/2018  . Leg wound, left 03/19/2018  . Right arm pain 11/11/2017  . Primary osteoarthritis of right foot 08/21/2017  . Syncope 02/27/2017  . Arthritis of right ankle 12/18/2016  . Carpal tunnel syndrome, bilateral 09/04/2016  . Leg swelling 08/16/2016  . Hand weakness 07/12/2016  . Degenerative arthritis of left knee 06/28/2016  . Iron deficiency anemia 03/16/2016  . Arthritis of left acromioclavicular joint 01/03/2016  . Rotator cuff disorder 01/03/2016  . Osteoarthritis of left knee 04/30/2015  . Baker's cyst of knee 04/30/2015  . Esophageal stricture 04/26/2015  . Benign prostatic hyperplasia 02/21/2015  . Constipation 02/21/2015  . Myalgia and myositis 11/18/2014  . Polyarticular arthritis  07/19/2013  . Chronic radicular low back pain 12/15/2012  . History of gout 02/20/2010  . ERECTILE DYSFUNCTION, ORGANIC 02/20/2010  . Hyperlipidemia 10/18/2008  . CAD 08/18/2007  . Essential hypertension 05/09/2007  . ALLERGIC RHINITIS 03/27/2007   Past Medical History:  Diagnosis Date  . ACTINIC KERATOSIS, HEAD 03/23/2008  . ALLERGIC RHINITIS 03/27/2007  . Arthritis   . BENIGN PROSTATIC HYPERTROPHY, WITH URINARY OBSTRUCTION 10/16/2007  . CAD 08/18/2007   "patient denies any issues with heart, does not see cardiologist"  . Carpal tunnel syndrome, bilateral 09/04/2016  . CARPAL TUNNEL SYNDROME, LEFT 04/13/2008  . Chronic back pain   . Chronic neck pain   . DVT (deep venous thrombosis) ([place])   . ERECTILE DYSFUNCTION, ORGANIC 02/20/2010  . Gout, unspecified 02/20/2010  . Headache(784.0)    migraines hx of  . History of melanoma   . HYPERLIPIDEMIA, WITH HIGH HDL 10/18/2008  . HYPERTENSION 05/09/2007   sees Dr. Benay Pillow  . KNEE PAIN 10/10/2009  . LOW BACK PAIN 03/27/2007  . NEOPLASM, MALIGNANT, SKIN, TRUNK 05/09/2007  . Patellar tendinitis 10/10/2009  . SKIN CANCER, HX OF 03/27/2007  . UNS ADVRS EFF UNS RX MEDICINAL&BIOLOGICAL SBSTNC 08/18/2007  . Urinary urgency     Family History  Problem Relation Age of Onset  . Dementia Mother   . Anemia Father   . Heart disease Father   . Stroke Sister        ICH  .  Lung disease Sister     Past Surgical History:  Procedure Laterality Date  . APPENDECTOMY  1969  . BACK SURGERY    . BLEPHAROPLASTY    . CARPAL TUNNEL RELEASE    . CHOLECYSTECTOMY    . ESOPHAGOGASTRODUODENOSCOPY (EGD) WITH PROPOFOL N/A 04/26/2015   Procedure: ESOPHAGOGASTRODUODENOSCOPY (EGD) WITH PROPOFOL;  Surgeon: Inda Castle, MD;  [facility];  Service: Endoscopy;  Laterality: N/A;  . EYE SURGERY     bilateral cataracts removed and vitrectomies  . JOINT REPLACEMENT Right    knee  . KNEE ARTHROSCOPY    . LAMINECTOMY    . LATERAL FUSION LUMBAR SPINE, TRANSVERSE     . LUMBAR LAMINECTOMY/DECOMPRESSION MICRODISCECTOMY Left 11/03/2012   Procedure: LUMBAR LAMINECTOMY/DECOMPRESSION MICRODISCECTOMY 1 LEVEL;  Surgeon: Hosie Spangle, MD;  Location: [place] [facility];  Service: Neurosurgery;  Laterality: Left;  LEFT L5S1 laminotomy foraminotomy and possible microdiskectomy  . MELANOMA EXCISION    . NASAL SINUS SURGERY    . POSTERIOR CERVICAL FUSION/FORAMINOTOMY Right 10/01/2012   Procedure: POSTERIOR CERVICAL FUSION/FORAMINOTOMY LEVEL 1;  Surgeon: Hosie Spangle, MD;  Location: [place] [facility];  Service: Neurosurgery;  Laterality: Right;  Right Cervical seven thoracic one Cervical laminectomy/foraminotomy with posterior cervical arthrodesis   . POSTERIOR LUMBAR FUSION N/A 05/23/2013   Procedure: exploration of lumbar wound, explantation of left interbody implant.;  Surgeon: Hosie Spangle, MD;  Location: [place];  Service: Neurosurgery;  Laterality: N/A;  . RECTAL SURGERY     Fissure  . TOTAL KNEE ARTHROPLASTY Left 05/07/2019   Procedure: LEFT TOTAL KNEE ARTHROPLASTY;  Surgeon: [name], MD;  Location: [place];  Service: Orthopedics;  Laterality: Left;  Marland Kitchen VITRECTOMY     right and left 2013   Social History   Occupational History  . Occupation: Retired  Tobacco Use  . Smoking status: Former Smoker    Packs/day: 1.00    Years: 33.00    Pack years: 33.00    Quit date: 09/29/1981    Years since quitting: 37.7  . Smokeless tobacco: Never Used  Substance and Sexual Activity  . Alcohol use: Yes    Alcohol/week: 3.0 standard drinks    Types: 3 Standard drinks or equivalent per week    Comment: Social  . Drug use: No  . Sexual activity: Yes    Partners: Female    Comment: Married

## 2019-07-14 DIAGNOSIS — M5416 Radiculopathy, lumbar region: Secondary | ICD-10-CM | POA: Diagnosis not present

## 2019-07-14 DIAGNOSIS — M48062 Spinal stenosis, lumbar region with neurogenic claudication: Secondary | ICD-10-CM | POA: Diagnosis not present

## 2019-07-14 DIAGNOSIS — M5136 Other intervertebral disc degeneration, lumbar region: Secondary | ICD-10-CM | POA: Diagnosis not present

## 2019-07-14 DIAGNOSIS — M4726 Other spondylosis with radiculopathy, lumbar region: Secondary | ICD-10-CM | POA: Diagnosis not present

## 2019-07-14 DIAGNOSIS — Z981 Arthrodesis status: Secondary | ICD-10-CM | POA: Diagnosis not present

## 2019-07-16 ENCOUNTER — Other Ambulatory Visit: Payer: Self-pay | Admitting: Internal Medicine

## 2019-08-02 ENCOUNTER — Other Ambulatory Visit (INDEPENDENT_AMBULATORY_CARE_PROVIDER_SITE_OTHER): Payer: Self-pay | Admitting: Specialist

## 2019-08-03 ENCOUNTER — Other Ambulatory Visit: Payer: Self-pay | Admitting: Internal Medicine

## 2019-08-03 ENCOUNTER — Encounter: Payer: Self-pay | Admitting: Internal Medicine

## 2019-08-03 DIAGNOSIS — M5416 Radiculopathy, lumbar region: Secondary | ICD-10-CM

## 2019-08-03 DIAGNOSIS — G8929 Other chronic pain: Secondary | ICD-10-CM

## 2019-08-03 NOTE — Telephone Encounter (Signed)
Please advise 

## 2019-08-03 NOTE — Telephone Encounter (Signed)
Yes. I will send it to [facility]

## 2019-08-03 NOTE — Telephone Encounter (Signed)
The pain office closed referral due to pt having surgery and they wanted to wait to see if he would still need them after the surgery.  I called the office and asked if I could just reopen the referral. She advised to put a new referral in so it can go through the review process again.

## 2019-08-05 ENCOUNTER — Telehealth: Payer: Self-pay | Admitting: *Deleted

## 2019-08-05 NOTE — Care Plan (Signed)
Call to patient to check status and any CM needs at 90 days post-op. He verbalized he was doing extremely well and had no needs currently. Reminded how to contact Dr. Randel Pigg office and CM if anything was needed. Sydnee Levans. Survey completed.

## 2019-08-05 NOTE — Telephone Encounter (Signed)
90 day Ortho bundle call and survey completed.

## 2019-08-09 ENCOUNTER — Other Ambulatory Visit: Payer: Self-pay | Admitting: Internal Medicine

## 2019-08-10 NOTE — Telephone Encounter (Signed)
He should not be out yet. If he is he needs visit. He was given 3 month supply end of October which should not be out until late January.

## 2019-08-11 ENCOUNTER — Other Ambulatory Visit: Payer: Self-pay | Admitting: Internal Medicine

## 2019-08-11 NOTE — Telephone Encounter (Signed)
Please see prior, he was given 3 month supply October late which should not have run out until end of January. Is he is taking outside of how prescription is written he will need virtual visit to discuss.

## 2019-08-17 ENCOUNTER — Telehealth: Payer: Self-pay | Admitting: Internal Medicine

## 2019-08-17 NOTE — Telephone Encounter (Signed)
FYI:  Received a call from [facility]. They stated pt was scheduled with Dr. Vira Blanco on 1/13. However, pt called them back on 12/29 stating he didn't want to see a pain mgmt doctor.

## 2019-08-19 ENCOUNTER — Other Ambulatory Visit: Payer: Self-pay | Admitting: Internal Medicine

## 2019-08-25 ENCOUNTER — Encounter: Payer: Self-pay | Admitting: Internal Medicine

## 2019-08-29 ENCOUNTER — Other Ambulatory Visit: Payer: Self-pay | Admitting: Internal Medicine

## 2019-09-01 ENCOUNTER — Other Ambulatory Visit: Payer: Self-pay | Admitting: Internal Medicine

## 2019-09-04 ENCOUNTER — Encounter: Payer: Self-pay | Admitting: Internal Medicine

## 2019-09-08 ENCOUNTER — Ambulatory Visit: Payer: Medicare Other

## 2019-09-16 ENCOUNTER — Encounter: Payer: Self-pay | Admitting: Internal Medicine

## 2019-09-17 ENCOUNTER — Ambulatory Visit (INDEPENDENT_AMBULATORY_CARE_PROVIDER_SITE_OTHER): Payer: Medicare Other | Admitting: Internal Medicine

## 2019-09-17 ENCOUNTER — Ambulatory Visit: Payer: Medicare Other | Attending: Internal Medicine

## 2019-09-17 ENCOUNTER — Encounter: Payer: Self-pay | Admitting: Internal Medicine

## 2019-09-17 ENCOUNTER — Other Ambulatory Visit: Payer: Self-pay

## 2019-09-17 ENCOUNTER — Ambulatory Visit (INDEPENDENT_AMBULATORY_CARE_PROVIDER_SITE_OTHER): Payer: Medicare Other

## 2019-09-17 VITALS — BP 160/82 | HR 89 | Temp 98.2°F | Ht 72.0 in | Wt 208.5 lb

## 2019-09-17 DIAGNOSIS — R0781 Pleurodynia: Secondary | ICD-10-CM

## 2019-09-17 DIAGNOSIS — S299XXA Unspecified injury of thorax, initial encounter: Secondary | ICD-10-CM | POA: Diagnosis not present

## 2019-09-17 DIAGNOSIS — Z23 Encounter for immunization: Secondary | ICD-10-CM

## 2019-09-17 MED ORDER — METHOCARBAMOL 750 MG PO TABS: 750.0000 mg | ORAL_TABLET | Freq: Three times a day (TID) | ORAL | 3 refills | Status: DC | PRN

## 2019-09-17 NOTE — Patient Instructions (Addendum)
We will check the x-ray today.  We have sent in robaxin (methocarbamol) which is a muscle relaxer to help with the pain you can take up to 3 times a day.

## 2019-09-17 NOTE — Telephone Encounter (Signed)
Pt has OV today at 3.40.

## 2019-09-17 NOTE — Progress Notes (Signed)
   Subjective:   Patient ID: [name], male    DOB: 1931-12-03, 86 y.o.   MRN: [id number]  HPI The patient is an 86 YO man coming in for left rib area pain. Had a problem where his left knee buckled on his 09/02/19 and this resulted in injury to the area. Has been trying his oxycodone which is not touching the pain. Has nothing else at home to try. Overall it is not improving. Denies SOB or cough.   Review of Systems  Constitutional: Positive for activity change.  HENT: Negative.   Eyes: Negative.   Respiratory: Negative for cough, chest tightness and shortness of breath.   Cardiovascular: Positive for chest pain. Negative for palpitations and leg swelling.  Gastrointestinal: Negative for abdominal distention, abdominal pain, constipation, diarrhea, nausea and vomiting.  Musculoskeletal: Positive for arthralgias, back pain and myalgias.  Skin: Negative.   Neurological: Negative.   Psychiatric/Behavioral: Negative.     Objective:  Physical Exam Constitutional:      Appearance: He is well-developed.  HENT:     Head: Normocephalic and atraumatic.  Cardiovascular:     Rate and Rhythm: Normal rate and regular rhythm.  Pulmonary:     Effort: Pulmonary effort is normal. No respiratory distress.     Breath sounds: Normal breath sounds. No wheezing or rales.  Abdominal:     General: Bowel sounds are normal. There is no distension.     Palpations: Abdomen is soft.     Tenderness: There is no abdominal tenderness. There is no rebound.  Musculoskeletal:        General: Tenderness present.     Cervical back: Normal range of motion.     Comments: Tenderness left flank  Skin:    General: Skin is warm and dry.  Neurological:     Mental Status: He is alert and oriented to person, place, and time.     Coordination: Coordination normal.     Vitals:   09/17/19 1551  BP: (!) 160/82  Pulse: 89  Temp: 98.2 F (36.8 C)  TempSrc: Oral  SpO2: 96%  Weight: 208 lb 8 oz (94.6 kg)    Height: 6' (1.829 m)    This visit occurred during the SARS-CoV-2 public health emergency.  Safety protocols were in place, including screening questions prior to the visit, additional usage of staff PPE, and extensive cleaning of exam room while observing appropriate contact time as indicated for disinfecting solutions.   Assessment & Plan:

## 2019-09-17 NOTE — Progress Notes (Signed)
   Covid-19 Vaccination Clinic  Name:  GRANDVILLE GALEY    MRN: [id number] DOB: September 01, 1931  09/17/2019  Mr. Aldinger was observed post Covid-19 immunization for 15 minutes without incidence. He was provided with Vaccine Information Sheet and instruction to access the V-Safe system.   Mr. Hebdon was instructed to call 911 with any severe reactions post vaccine: Marland Kitchen Difficulty breathing  . Swelling of your face and throat  . A fast heartbeat  . A bad rash all over your body  . Dizziness and weakness    Immunizations Administered    Name Date Dose VIS Date Route   Pfizer COVID-19 Vaccine 09/17/2019 10:12 AM 0.3 mL 07/24/2019 Intramuscular   Manufacturer: [place]   Lot: [id number]   [place]: [id number]

## 2019-09-18 DIAGNOSIS — R0781 Pleurodynia: Secondary | ICD-10-CM | POA: Insufficient documentation

## 2019-09-18 NOTE — Assessment & Plan Note (Signed)
Checking x-ray left ribs to assess for fracture. We talked about how there is no change in management if fracture or just pulled muscle. Rx robaxin 750 mg TID prn for pain to try.

## 2019-10-12 ENCOUNTER — Ambulatory Visit: Payer: Medicare Other | Attending: Internal Medicine

## 2019-10-12 DIAGNOSIS — Z23 Encounter for immunization: Secondary | ICD-10-CM | POA: Insufficient documentation

## 2019-10-12 NOTE — Progress Notes (Signed)
   Covid-19 Vaccination Clinic  Name:  WEN GOICOECHEA    MRN: [id number] DOB: [birth year]  10/12/2019  Mr. Angelo was observed post Covid-19 immunization for 15 minutes without incidence. He was provided with Vaccine Information Sheet and instruction to access the V-Safe system.   Mr. Gane was instructed to call 911 with any severe reactions post vaccine: Marland Kitchen Difficulty breathing  . Swelling of your face and throat  . A fast heartbeat  . A bad rash all over your body  . Dizziness and weakness    Immunizations Administered    Name Date Dose VIS Date Route   Pfizer COVID-19 Vaccine 10/12/2019  2:53 PM 0.3 mL 07/24/2019 Intramuscular   Manufacturer: [place]   Lot: [id number]   [place]: [id number]

## 2019-10-13 ENCOUNTER — Telehealth: Payer: Self-pay

## 2019-10-13 MED ORDER — SPIRONOLACTONE 25 MG PO TABS: 25.0000 mg | ORAL_TABLET | Freq: Every day | ORAL | 1 refills | Status: DC

## 2019-10-13 NOTE — Telephone Encounter (Signed)
1.Medication Requested:spironolactone (ALDACTONE) 25 MG tablet  2. Pharmacy (Name, Street, [place]): [facility] on [address] [place]   3. On Med List: Yes   4. Last Visit with PCP: 2.4.2021   5. Next visit date with PCP: pending

## 2019-10-14 ENCOUNTER — Encounter: Payer: Self-pay | Admitting: Internal Medicine

## 2019-10-22 ENCOUNTER — Ambulatory Visit: Payer: Self-pay

## 2019-10-22 ENCOUNTER — Other Ambulatory Visit: Payer: Self-pay

## 2019-10-22 ENCOUNTER — Encounter: Payer: Self-pay | Admitting: Orthopedic Surgery

## 2019-10-22 ENCOUNTER — Ambulatory Visit (INDEPENDENT_AMBULATORY_CARE_PROVIDER_SITE_OTHER): Payer: Medicare Other | Admitting: Orthopedic Surgery

## 2019-10-22 ENCOUNTER — Other Ambulatory Visit: Payer: Self-pay | Admitting: Internal Medicine

## 2019-10-22 DIAGNOSIS — M25562 Pain in left knee: Secondary | ICD-10-CM | POA: Diagnosis not present

## 2019-10-22 DIAGNOSIS — Z96652 Presence of left artificial knee joint: Secondary | ICD-10-CM | POA: Diagnosis not present

## 2019-10-23 NOTE — Progress Notes (Signed)
Office Visit Note   Patient: [name]           Date of Birth: August 16, 1931           MRN: [id number] Visit Date: 10/22/2019 Requested by: Hoyt Koch, MD [address] [place],  [place] [address] PCP: Hoyt Koch, MD  Subjective: Chief Complaint  Patient presents with  . Left Knee - Routine Post Op    HPI: [name] is a 86 y.o. male who presents to the office complaining of left knee pain.  Patient is s/p left total knee arthroplasty on 05/07/2019.  He returns to the office complaining of pain and swelling surrounding the left knee following a fall onto the left knee in January.  He notes that after falling onto his knee he was able to weight-bear immediately afterward but he has had increased pain since then.  He also notes an increase in swelling around the left knee.  He has been taking oxycodone 40 mg as he typically does for his chronic pain.  He denies any fevers or chills.  He denies any difficulty with extending his leg.  He is not requiring the use of any cane or walker..                ROS:  All systems reviewed are negative as they relate to the chief complaint within the history of present illness.  Patient denies fevers or chills.  Assessment & Plan: Visit Diagnoses:  1. History of total knee arthroplasty, left   2. Left knee pain, unspecified chronicity     Plan: Patient is a 86 year old male who presents following fall onto the left knee in January 2021.  He is status post left total knee arthroplasty in late September 2020.  Radiographs reveal a faint lucency through the midportion of the left patella.  This was not present on postoperative radiographs that were taken prior to his fall.  This may represent a left patella fracture that was sustained 6 weeks ago but has healed well.  Regardless, patient is doing better now and he has no interruption to his extensor mechanism.  He has excellent range of motion with 0 degrees of extension and  110 degrees of flexion.  He has no pain with passive range of motion.  I do not think that patient's pain is a surgical problem at this time; patient is satisfied with this.  He will return to the office if his pain worsens or does not improve over the coming weeks to months.  He is cautioned not to do too much in terms of vigorous activity particularly with stair climbing over the next 4 weeks in order to allow complete healing of this fracture to occur.  He has already stressed it significantly and I think the chances of displacement are unlikely.  Follow-Up Instructions: No follow-ups on file.   Orders:  Orders Placed This Encounter  Procedures  . XR Knee 1-2 Views Left   No orders of the defined types were placed in this encounter.     Procedures: No procedures performed   Clinical Data: No additional findings.  Objective: Vital Signs: There were no vitals taken for this visit.  Physical Exam:  Constitutional: Patient appears well-developed HEENT:  Head: Normocephalic Eyes:EOM are normal Neck: Normal range of motion Cardiovascular: Normal rate Pulmonary/chest: Effort normal Neurologic: Patient is alert Skin: Skin is warm Psychiatric: Patient has normal mood and affect  Ortho Exam:  Left knee Exam  Positive effusion with some bogginess around the patella Well-healed surgical incision from prior left total knee arthroplasty Extensor mechanism intact Mild tenderness to palpation over the midportion of the left patella. No TTP over the medial or lateral jointlines, quad tendon, patellar tendon, pes anserinus, tibial tubercle, LCL/MCL insertions Stable to varus/valgus stresses.  Stable to anterior/posterior drawer Extension to 0 degrees Flexion to 110 degrees  Specialty Comments:  No specialty comments available.  Imaging: No results found.   PMFS History: Patient Active Problem List   Diagnosis Date Noted  . Rib pain on left side 09/18/2019  . Arthritis of left  knee 05/07/2019  . Pre-operative clearance 04/28/2019  . Acute diastolic CHF (congestive heart failure) ([facility]) 08/10/2018  . Leg wound, left 03/19/2018  . Right arm pain 11/11/2017  . Primary osteoarthritis of right foot 08/21/2017  . Syncope 02/27/2017  . Arthritis of right ankle 12/18/2016  . Carpal tunnel syndrome, bilateral 09/04/2016  . Leg swelling 08/16/2016  . Hand weakness 07/12/2016  . Degenerative arthritis of left knee 06/28/2016  . Iron deficiency anemia 03/16/2016  . Arthritis of left acromioclavicular joint 01/03/2016  . Rotator cuff disorder 01/03/2016  . Osteoarthritis of left knee 04/30/2015  . Baker's cyst of knee 04/30/2015  . Esophageal stricture 04/26/2015  . Benign prostatic hyperplasia 02/21/2015  . Constipation 02/21/2015  . Myalgia and myositis 11/18/2014  . Polyarticular arthritis 07/19/2013  . Chronic radicular low back pain 12/15/2012  . History of gout 02/20/2010  . ERECTILE DYSFUNCTION, ORGANIC 02/20/2010  . Hyperlipidemia 10/18/2008  . CAD 08/18/2007  . Essential hypertension 05/09/2007  . ALLERGIC RHINITIS 03/27/2007   Past Medical History:  Diagnosis Date  . ACTINIC KERATOSIS, HEAD 03/23/2008  . ALLERGIC RHINITIS 03/27/2007  . Arthritis   . BENIGN PROSTATIC HYPERTROPHY, WITH URINARY OBSTRUCTION 10/16/2007  . CAD 08/18/2007   "patient denies any issues with heart, does not see cardiologist"  . Carpal tunnel syndrome, bilateral 09/04/2016  . CARPAL TUNNEL SYNDROME, LEFT 04/13/2008  . Chronic back pain   . Chronic neck pain   . DVT (deep venous thrombosis) ([facility])   . ERECTILE DYSFUNCTION, ORGANIC 02/20/2010  . Gout, unspecified 02/20/2010  . Headache(784.0)    migraines hx of  . History of melanoma   . HYPERLIPIDEMIA, WITH HIGH HDL 10/18/2008  . HYPERTENSION 05/09/2007   sees Dr. Benay Pillow  . KNEE PAIN 10/10/2009  . LOW BACK PAIN 03/27/2007  . NEOPLASM, MALIGNANT, SKIN, TRUNK 05/09/2007  . Patellar tendinitis 10/10/2009  . SKIN CANCER, HX OF  03/27/2007  . UNS ADVRS EFF UNS RX MEDICINAL&BIOLOGICAL SBSTNC 08/18/2007  . Urinary urgency     Family History  Problem Relation Age of Onset  . Dementia Mother   . Anemia Father   . Heart disease Father   . Stroke Sister        ICH  . Lung disease Sister     Past Surgical History:  Procedure Laterality Date  . APPENDECTOMY  1969  . BACK SURGERY    . BLEPHAROPLASTY    . CARPAL TUNNEL RELEASE    . CHOLECYSTECTOMY    . ESOPHAGOGASTRODUODENOSCOPY (EGD) WITH PROPOFOL N/A 04/26/2015   Procedure: ESOPHAGOGASTRODUODENOSCOPY (EGD) WITH PROPOFOL;  Surgeon: Inda Castle, MD;  Location: [facility];  Service: Endoscopy;  Laterality: N/A;  . EYE SURGERY     bilateral cataracts removed and vitrectomies  . JOINT REPLACEMENT Right    knee  . KNEE ARTHROSCOPY    . LAMINECTOMY    . LATERAL FUSION LUMBAR  SPINE, TRANSVERSE    . LUMBAR LAMINECTOMY/DECOMPRESSION MICRODISCECTOMY Left 11/03/2012   Procedure: LUMBAR LAMINECTOMY/DECOMPRESSION MICRODISCECTOMY 1 LEVEL;  Surgeon: Hosie Spangle, MD;  Location: [facility];  Service: Neurosurgery;  Laterality: Left;  LEFT L5S1 laminotomy foraminotomy and possible microdiskectomy  . MELANOMA EXCISION    . NASAL SINUS SURGERY    . POSTERIOR CERVICAL FUSION/FORAMINOTOMY Right 10/01/2012   Procedure: POSTERIOR CERVICAL FUSION/FORAMINOTOMY LEVEL 1;  Surgeon: Hosie Spangle, MD;  Location: [facility];  Service: Neurosurgery;  Laterality: Right;  Right Cervical seven thoracic one Cervical laminectomy/foraminotomy with posterior cervical arthrodesis   . POSTERIOR LUMBAR FUSION N/A 05/23/2013   Procedure: exploration of lumbar wound, explantation of left interbody implant.;  Surgeon: Hosie Spangle, MD;  Location: [facility];  Service: Neurosurgery;  Laterality: N/A;  . RECTAL SURGERY     Fissure  . TOTAL KNEE ARTHROPLASTY Left 05/07/2019   Procedure: LEFT TOTAL KNEE ARTHROPLASTY;  Surgeon: [name], MD;  Location: [facility];  Service: Orthopedics;   Laterality: Left;  Marland Kitchen VITRECTOMY     right and left 2013   Social History   Occupational History  . Occupation: Retired  Tobacco Use  . Smoking status: Former Smoker    Packs/day: 1.00    Years: 33.00    Pack years: 33.00    Quit date: 09/29/1981    Years since quitting: 38.0  . Smokeless tobacco: Never Used  Substance and Sexual Activity  . Alcohol use: Yes    Alcohol/week: 3.0 standard drinks    Types: 3 Standard drinks or equivalent per week    Comment: Social  . Drug use: No  . Sexual activity: Yes    Partners: Female    Comment: Married

## 2019-11-04 ENCOUNTER — Telehealth: Payer: Self-pay | Admitting: *Deleted

## 2019-11-04 NOTE — Telephone Encounter (Signed)
He needs CT scan of that knee he needs a CT scan of the knee to evaluate for occult fracture thanks

## 2019-11-04 NOTE — Telephone Encounter (Signed)
Patient called me stating that his Left knee has not improved and he really is having difficulty doing house chores. He has to do a lot on his own at home and has to climb stairs he states. He was seen last week by you and reports swelling is still there as well as pain. He fell in January onto that knee. He asked if there was any medication that he can take such as Prednisone that will help with inflammation. We discussed he already takes a daily Prednisone (Deltasone) 10 mg prescribed by his PCP however to help with inflammation of the joints. He mentioned he has been prone to gout. He just wants to know if there is anything he can do at this point. Thanks.

## 2019-11-05 ENCOUNTER — Other Ambulatory Visit: Payer: Self-pay | Admitting: *Deleted

## 2019-11-05 DIAGNOSIS — M25562 Pain in left knee: Secondary | ICD-10-CM

## 2019-11-05 NOTE — Telephone Encounter (Signed)
CT of Left knee order put in as requested; called patient to make him aware. Thank you.

## 2019-11-05 NOTE — Telephone Encounter (Signed)
thx

## 2019-11-06 ENCOUNTER — Telehealth: Payer: Self-pay | Admitting: *Deleted

## 2019-11-06 ENCOUNTER — Other Ambulatory Visit: Payer: Self-pay

## 2019-11-06 ENCOUNTER — Ambulatory Visit
Admission: RE | Admit: 2019-11-06 | Discharge: 2019-11-06 | Disposition: A | Discharge status: Home / Self Care | Payer: Medicare Other | Source: Ambulatory Visit | Attending: Orthopedic Surgery | Admitting: Orthopedic Surgery

## 2019-11-06 DIAGNOSIS — S82035A Nondisplaced transverse fracture of left patella, initial encounter for closed fracture: Secondary | ICD-10-CM | POA: Diagnosis not present

## 2019-11-06 DIAGNOSIS — M25562 Pain in left knee: Secondary | ICD-10-CM

## 2019-11-06 NOTE — Telephone Encounter (Signed)
Error

## 2019-11-06 NOTE — Progress Notes (Signed)
He has a patella fracture which I think we saw last time.  If he is miserable then he needs to stay in a knee immobilizer for couple of weeks to let that calm down.  The fracture at this time is nondisplaced.  Knee immobilizer and weightbearing as tolerated is the way to go with this.

## 2019-11-10 ENCOUNTER — Encounter: Payer: Self-pay | Admitting: Internal Medicine

## 2019-11-12 ENCOUNTER — Ambulatory Visit: Payer: Medicare Other | Admitting: Orthopedic Surgery

## 2019-11-14 ENCOUNTER — Other Ambulatory Visit: Payer: Self-pay | Admitting: Internal Medicine

## 2019-11-18 ENCOUNTER — Other Ambulatory Visit: Payer: Self-pay | Admitting: Internal Medicine

## 2019-11-19 ENCOUNTER — Other Ambulatory Visit: Payer: Medicare Other

## 2019-11-20 ENCOUNTER — Other Ambulatory Visit: Payer: Self-pay

## 2019-11-20 ENCOUNTER — Encounter: Payer: Self-pay | Admitting: Internal Medicine

## 2019-11-20 ENCOUNTER — Ambulatory Visit (INDEPENDENT_AMBULATORY_CARE_PROVIDER_SITE_OTHER): Payer: Medicare Other

## 2019-11-20 ENCOUNTER — Ambulatory Visit (INDEPENDENT_AMBULATORY_CARE_PROVIDER_SITE_OTHER): Payer: Medicare Other | Admitting: Internal Medicine

## 2019-11-20 VITALS — BP 140/78 | HR 80 | Temp 98.6°F | Resp 16 | Ht 72.0 in | Wt 205.4 lb

## 2019-11-20 DIAGNOSIS — M130 Polyarthritis, unspecified: Secondary | ICD-10-CM | POA: Diagnosis not present

## 2019-11-20 DIAGNOSIS — Z0000 Encounter for general adult medical examination without abnormal findings: Secondary | ICD-10-CM

## 2019-11-20 MED ORDER — PREDNISONE 10 MG PO TABS: 10.0000 mg | ORAL_TABLET | Freq: Every day | ORAL | 0 refills | Status: DC

## 2019-11-20 NOTE — Patient Instructions (Signed)
We have sent in the prednisone so you have more per month to give some flexibility.

## 2019-11-20 NOTE — Progress Notes (Signed)
   Subjective:   Patient ID: [name], male    DOB: 1932-04-22, 86 y.o.   MRN: [id number]  HPI The patient is an 86 YO man coming in for concerns about arthritis pain. His knee is improving well but he did have a flare of gout since our last visit. He does have 10 mg prednisone daily but with flare of gout he takes 4 pills for 2 days, then 3 pills for 2 days then 2 pills for 2-3 days then back to his normal 10 mg daily. He needs refill with more flexibility so he does not run out of medication.   Review of Systems  Constitutional: Positive for activity change. Negative for appetite change, fatigue, fever and unexpected weight change.  Respiratory: Negative.   Cardiovascular: Negative.   Musculoskeletal: Positive for arthralgias, back pain and myalgias.  Skin: Negative.   Neurological: Negative for syncope, weakness and numbness.    Objective:  Physical Exam Constitutional:      Appearance: He is well-developed.  HENT:     Head: Normocephalic and atraumatic.  Cardiovascular:     Rate and Rhythm: Normal rate and regular rhythm.  Pulmonary:     Effort: Pulmonary effort is normal. No respiratory distress.     Breath sounds: Normal breath sounds. No wheezing or rales.  Abdominal:     General: Bowel sounds are normal. There is no distension.     Palpations: Abdomen is soft.     Tenderness: There is no abdominal tenderness. There is no rebound.  Musculoskeletal:        General: Swelling, tenderness and deformity present.     Cervical back: Normal range of motion.  Skin:    General: Skin is warm and dry.  Neurological:     Mental Status: He is alert and oriented to person, place, and time.     Coordination: Coordination normal.     Vitals:   11/20/19 1543  BP: 140/80  Pulse: 80  Temp: 98.6 F (37 C)  TempSrc: Oral  SpO2: 95%  Weight: 205 lb (93 kg)  Height: 6' (1.829 m)    This visit occurred during the SARS-CoV-2 public health emergency.  Safety protocols were in  place, including screening questions prior to the visit, additional usage of staff PPE, and extensive cleaning of exam room while observing appropriate contact time as indicated for disinfecting solutions.   Assessment & Plan:

## 2019-11-20 NOTE — Progress Notes (Signed)
Subjective:   [name] is a 86 y.o. male who presents for Medicare Annual/Subsequent preventive examination.  Review of Systems:  Medicare Wellness Visit Cardiac Risk Factors include: advanced age (>39men, >26 women);dyslipidemia;hypertension;male gender;obesity (BMI >30kg/m2) Sleep Patterns: No issues with falling sleep; gets up to void 3-4 times per night. Home Safety/Smoke Alarms: Feels safe in home; Smoke alarms in place. Living environment: Lives at home with his wife. Seat Belt Safety/Bike Helmet: Wears seat belt.    Objective:    Vitals: BP 140/78 (BP Location: Left Arm, Patient Position: Sitting, Cuff Size: Normal)   Pulse 80   Temp 98.6 F (37 C)   Resp 16   Ht 6' (1.829 m)   Wt 205 lb 6.4 oz (93.2 kg)   SpO2 95%   BMI 27.86 kg/m   Body mass index is 27.86 kg/m.  Advanced Directives 11/20/2019 04/30/2019 01/25/2017 04/26/2015 12/21/2014 06/02/2013 05/23/2013  Does Patient Have a Medical Advance Directive? Yes Yes No Yes Yes Patient does not have advance directive Patient has advance directive, copy not in chart  Type of Advance Directive Living will [place];Living will - - - Healthcare Power of [place] in Chart? - No - copy requested - - No - copy requested - -  Pre-existing out of facility DNR order (yellow form or pink MOST form) - - - - - No No    Tobacco Social History   Tobacco Use  Smoking Status Former Smoker  . Packs/day: 1.00  . Years: 33.00  . Pack years: 33.00  . Quit date: 09/29/1981  . Years since quitting: 38.1  Smokeless Tobacco Never Used     Counseling given: No   Clinical Intake:  Pre-visit preparation completed: Yes  Pain : No/denies pain Pain Score: 0-No pain     Nutritional Status: BMI 25 -29 Overweight Nutritional Risks: None Diabetes: No  How often do you need to have someone help you when you read instructions, pamphlets, or other  written materials from your doctor or pharmacy?: 1 - Never  Interpreter Needed?: No  Information entered by :: Deniz Hannan N. Lowell Guitar, LPN  Past Medical History:  Diagnosis Date  . ACTINIC KERATOSIS, HEAD 03/23/2008  . ALLERGIC RHINITIS 03/27/2007  . Arthritis   . BENIGN PROSTATIC HYPERTROPHY, WITH URINARY OBSTRUCTION 10/16/2007  . CAD 08/18/2007   "patient denies any issues with heart, does not see cardiologist"  . Carpal tunnel syndrome, bilateral 09/04/2016  . CARPAL TUNNEL SYNDROME, LEFT 04/13/2008  . Chronic back pain   . Chronic neck pain   . DVT (deep venous thrombosis) ([place])   . ERECTILE DYSFUNCTION, ORGANIC 02/20/2010  . Gout, unspecified 02/20/2010  . Headache(784.0)    migraines hx of  . History of melanoma   . HYPERLIPIDEMIA, WITH HIGH HDL 10/18/2008  . HYPERTENSION 05/09/2007   sees Dr. Benay Pillow  . KNEE PAIN 10/10/2009  . LOW BACK PAIN 03/27/2007  . NEOPLASM, MALIGNANT, SKIN, TRUNK 05/09/2007  . Patellar tendinitis 10/10/2009  . SKIN CANCER, HX OF 03/27/2007  . UNS ADVRS EFF UNS RX MEDICINAL&BIOLOGICAL SBSTNC 08/18/2007  . Urinary urgency    Past Surgical History:  Procedure Laterality Date  . APPENDECTOMY  1969  . BACK SURGERY    . BLEPHAROPLASTY    . CARPAL TUNNEL RELEASE    . CHOLECYSTECTOMY    . ESOPHAGOGASTRODUODENOSCOPY (EGD) WITH PROPOFOL N/A 04/26/2015   Procedure: ESOPHAGOGASTRODUODENOSCOPY (EGD) WITH PROPOFOL;  Surgeon: Herbie Baltimore  Shaaron Adler, MD;  Location: [facility];  Service: Endoscopy;  Laterality: N/A;  . EYE SURGERY     bilateral cataracts removed and vitrectomies  . JOINT REPLACEMENT Right    knee  . KNEE ARTHROSCOPY    . LAMINECTOMY    . LATERAL FUSION LUMBAR SPINE, TRANSVERSE    . LUMBAR LAMINECTOMY/DECOMPRESSION MICRODISCECTOMY Left 11/03/2012   Procedure: LUMBAR LAMINECTOMY/DECOMPRESSION MICRODISCECTOMY 1 LEVEL;  Surgeon: Hosie Spangle, MD;  Location: [facility];  Service: Neurosurgery;  Laterality: Left;  LEFT L5S1 laminotomy foraminotomy and  possible microdiskectomy  . MELANOMA EXCISION    . NASAL SINUS SURGERY    . POSTERIOR CERVICAL FUSION/FORAMINOTOMY Right 10/01/2012   Procedure: POSTERIOR CERVICAL FUSION/FORAMINOTOMY LEVEL 1;  Surgeon: Hosie Spangle, MD;  Location: [facility];  Service: Neurosurgery;  Laterality: Right;  Right Cervical seven thoracic one Cervical laminectomy/foraminotomy with posterior cervical arthrodesis   . POSTERIOR LUMBAR FUSION N/A 05/23/2013   Procedure: exploration of lumbar wound, explantation of left interbody implant.;  Surgeon: Hosie Spangle, MD;  Location: [facility];  Service: Neurosurgery;  Laterality: N/A;  . RECTAL SURGERY     Fissure  . TOTAL KNEE ARTHROPLASTY Left 05/07/2019   Procedure: LEFT TOTAL KNEE ARTHROPLASTY;  Surgeon: [name], MD;  Location: [facility];  Service: Orthopedics;  Laterality: Left;  Marland Kitchen VITRECTOMY     right and left 2013   Family History  Problem Relation Age of Onset  . Dementia Mother   . Anemia Father   . Heart disease Father   . Stroke Sister        ICH  . Lung disease Sister    Social History   Socioeconomic History  . Marital status: Married    Spouse name: Not on file  . Number of children: 3  . Years of education: 89  . Highest education level: Not on file  Occupational History  . Occupation: Retired  Tobacco Use  . Smoking status: Former Smoker    Packs/day: 1.00    Years: 33.00    Pack years: 33.00    Quit date: 09/29/1981    Years since quitting: 38.1  . Smokeless tobacco: Never Used  Substance and Sexual Activity  . Alcohol use: Yes    Alcohol/week: 3.0 standard drinks    Types: 3 Standard drinks or equivalent per week    Comment: Social  . Drug use: No  . Sexual activity: Yes    Partners: Female    Comment: Married  Other Topics Concern  . Not on file  Social History Narrative   Lives at home w/ his wife   Left-handed   Caffeine: 1 cup coffee daily   1 son alive, 2 sons deceased   Retired Building control surveyor, Furniture conservator/restorer.     Education: GED   Social Determinants of Radio broadcast assistant Strain:   . Difficulty of Paying Living Expenses:   Food Insecurity:   . Worried About Charity fundraiser in the Last Year:   . Arboriculturist in the Last Year:   Transportation Needs:   . Film/video editor (Medical):   Marland Kitchen Lack of Transportation (Non-Medical):   Physical Activity:   . Days of Exercise per Week:   . Minutes of Exercise per Session:   Stress:   . Feeling of Stress :   Social Connections:   . Frequency of Communication with Friends and Family:   . Frequency of Social Gatherings with Friends and Family:   .  Attends Religious Services:   . Active Member of Clubs or Organizations:   . Attends Archivist Meetings:   Marland Kitchen Marital Status:     Outpatient Encounter Medications as of 11/20/2019  Medication Sig  . amLODipine (NORVASC) 10 MG tablet TAKE 1 TABLET BY MOUTH EVERY DAY (Patient taking differently: Take 10 mg by mouth daily. )  . carvedilol (COREG) 25 MG tablet TAKE 1 TABLET BY MOUTH 2 TIMES DAILY WITH A MEAL  . hydrALAZINE (APRESOLINE) 50 MG tablet TAKE 1 TABLET BY MOUTH TWICE A DAY  . spironolactone (ALDACTONE) 25 MG tablet Take 1 tablet (25 mg total) by mouth daily.  . tamsulosin (FLOMAX) 0.4 MG CAPS capsule TAKE 1 CAPSULE BY MOUTH EVERY DAY  . chlorhexidine (PERIDEX) 0.12 % solution SMARTSIG:0.5 Ounce(s) By Mouth Twice Daily  . [facility] ASPIRIN ADULT LOW DOSE 81 MG chewable tablet CHEW 1 TABLET (81 MG TOTAL) BY MOUTH DAILY.  . [facility] MAGNESIUM OXIDE 250 MG TABS TAKE 1 TABLET BY MOUTH EVERY DAY  . [facility] VITAMIN B12 1000 MCG tablet TAKE 1 TABLET (1,000 MCG TOTAL) BY MOUTH DAILY. (Patient taking differently: Take 1,000 mcg by mouth daily. )  . finasteride (PROSCAR) 5 MG tablet TAKE 1 TABLET BY MOUTH EVERY DAY (Patient taking differently: Take 5 mg by mouth at bedtime. )  . fluticasone (FLONASE) 50 MCG/ACT nasal spray Place 1 spray into both nostrils daily as needed for allergies.   .  hydrocortisone 2.5 % cream hydrocortisone 2.5 % topical cream  . methocarbamol (ROBAXIN) 750 MG tablet Take 1 tablet (750 mg total) by mouth every 8 (eight) hours as needed for muscle spasms. (Patient not taking: Reported on 11/20/2019)  . mupirocin ointment (BACTROBAN) 2 % APPLY NASALLY TWICE A DAY FOR 5 DAYS PRIOR TO SURGERY  . Oxycodone HCl 10 MG TABS Take 1 tablet (10 mg total) by mouth every 4 (four) hours as needed (pain score 4-6).  Marland Kitchen predniSONE (DELTASONE) 10 MG tablet TAKE 1 TABLET BY MOUTH DAILY WITH BREAKFAST (Patient not taking: Reported on 11/20/2019)  . triamcinolone cream (KENALOG) 0.5 % APPLY 1 APPLICATION TOPICALLY 2 (TWO) TIMES DAILY AS NEEDED (DRY SKIN, ITCHING).   No facility-administered encounter medications on file as of 11/20/2019.    Activities of Daily Living In your present state of health, do you have any difficulty performing the following activities: 11/20/2019 04/30/2019  Hearing? N N  Vision? N N  Difficulty concentrating or making decisions? Y N  Comment at times but not much -  Walking or climbing stairs? N N  Dressing or bathing? N N  Doing errands, shopping? N N  Preparing Food and eating ? N -  Using the Toilet? N -  In the past six months, have you accidently leaked urine? N -  Do you have problems with loss of bowel control? N -  Managing your Medications? N -  Managing your Finances? N -  Housekeeping or managing your Housekeeping? N -  Some recent data might be hidden    Patient Care Team: Hoyt Koch, MD as PCP - General (Internal Medicine) Jovita Gamma, MD as Consulting Physician (Neurosurgery) Jovita Gamma, MD as Consulting Physician (Neurosurgery)   Assessment:   This is a routine wellness examination for [place].  Exercise Activities and Dietary recommendations Current Exercise Habits: The patient does not participate in regular exercise at present, Exercise limited by: orthopedic condition(s)  Goals    . Patient Stated      To stay alive and continue  to stay active.       Fall Risk Fall Risk  11/20/2019 04/07/2019 03/19/2018 02/27/2017 04/20/2016  Falls in the past year? 1 0 Yes Yes No  Number falls in past yr: 0 - 1 2 or more -  Injury with Fall? 1 - No No -  Risk for fall due to : Impaired balance/gait;Orthopedic patient - - Other (Comment) -  Follow up Falls evaluation completed;Education provided;Falls prevention discussed - - Falls evaluation completed;Education provided;Falls prevention discussed -   Is the patient's home free of loose throw rugs in walkways, pet beds, electrical cords, etc?   yes      Grab bars in the bathroom? yes      Handrails on the stairs?   yes      Adequate lighting?   yes  Depression Screen PHQ 2/9 Scores 11/20/2019 04/07/2019 03/19/2018 04/20/2016  PHQ - 2 Score 0 0 0 0  PHQ- 9 Score - - - -    Cognitive Function     6CIT Screen 11/20/2019  What Year? 0 points  What month? 0 points  What time? 0 points  Count back from 20 0 points  Months in reverse 0 points  Repeat phrase 2 points  Total Score 2    Immunization History  Administered Date(s) Administered  . Fluad Quad(high Dose 65+) 04/07/2019  . Influenza Split 06/04/2011, 04/28/2012  . Influenza Whole 06/13/1998, 06/05/2007, 05/18/2008, 05/23/2009, 05/22/2010  . Influenza, High Dose Seasonal PF 04/20/2016, 04/18/2017, 08/18/2018  . Influenza,inj,Quad PF,6+ Mos 05/05/2013, 06/08/2014  . Influenza-Unspecified 05/21/2015  . PFIZER SARS-COV-2 Vaccination 09/17/2019, 10/12/2019  . Pneumococcal Conjugate-13 12/17/2014  . Pneumococcal Polysaccharide-23 08/13/2006, 10/02/2012  . Td 08/13/2006  . Tdap 11/06/2017  . Zoster 03/06/2016    Qualifies for Shingles Vaccine? Will discuss at next visit.  Screening Tests Health Maintenance  Topic Date Due  . INFLUENZA VACCINE  03/13/2020  . TETANUS/TDAP  11/07/2027  . PNA vac Low Risk Adult  Completed   Cancer Screenings: Lung: Low Dose CT Chest recommended if Age 80-80  years, 30 pack-year currently smoking OR have quit w/in 15years. Patient does not qualify. Colorectal: not recommended due to age.       Plan:   Reviewed health maintenance screenings with patient today and relevant education, vaccines, and/or referrals were provided.    Continue doing brain stimulating activities (puzzles, reading, adult coloring books, staying active) to keep memory sharp.    Continue to eat heart healthy diet (full of fruits, vegetables, whole grains, lean protein, water--limit salt, fat, and sugar intake) and increase physical activity as tolerated.  I have personally reviewed and noted the following in the patient's chart:   . Medical and social history . Use of alcohol, tobacco or illicit drugs  . Current medications and supplements . Functional ability and status . Nutritional status . Physical activity . Advanced directives . List of other physicians . Hospitalizations, surgeries, and ER visits in previous 12 months . Vitals . Screenings to include cognitive, depression, and falls . Referrals and appointments  In addition, I have reviewed and discussed with patient certain preventive protocols, quality metrics, and best practice recommendations. A written personalized care plan for preventive services as well as general preventive health recommendations were provided to patient.     Sheral Flow, LPN  [id number] Nurse Health Advisor, Embedded Care Coordination [place] [facility]  Site: [facility] [place]: [phone] Website: [email or web address]

## 2019-11-20 NOTE — Patient Instructions (Addendum)
[name] , Thank you for taking time to come for your Medicare Wellness Visit. I appreciate your ongoing commitment to your health goals. Please review the following plan we discussed and let me know if I can assist you in the future.   Screening recommendations/referrals: Colorectal Screening: not recommended due to age  86 and Dental Exams: Recommended annual ophthalmology exams for early detection of glaucoma and other disorders of the eye Recommended annual dental exams for proper oral hygiene  Vaccinations: Influenza vaccine: last done on 04/07/2019 Pneumococcal vaccine: last done on 10/02/2012 & 12/17/2014 Tdap vaccine: last done on 11/06/2017 Shingles vaccine: Please call your insurance company to determine your out of pocket expense for the Shingrix vaccine. You may receive this vaccine at your local pharmacy. Covid vaccine: last done on 09/17/2019 & 10/12/2019  Advanced directives: Advance directives discussed with you today. Please bring a copy of your POA (Power of [place]) and/or Living Will to your next appointment.  Goals:  Recommend to drink at least 6-8 8oz glasses of water per day.  Recommend to exercise for at least 150 minutes per week.  Recommend to remove any items from the home that may cause slips or trips.  Recommend to decrease portion sizes by eating 3 small healthy meals and at least 2 healthy snacks per day.  Recommend to begin DASH diet as directed below  Recommend to continue efforts to reduce smoking habits until no longer smoking. Smoking Cessation literature is attached below.  Next appointment: Please schedule your Annual Wellness Visit with your Nurse Health Advisor in one year.  Preventive Care 56 Years and Older, Male Preventive care refers to lifestyle choices and visits with your health care provider that can promote health and wellness. What does preventive care include?  A yearly physical exam. This is also called an annual well  check.  Dental exams once or twice a year.  Routine eye exams. Ask your health care provider how often you should have your eyes checked.  Personal lifestyle choices, including:  Daily care of your teeth and gums.  Regular physical activity.  Eating a healthy diet.  Avoiding tobacco and drug use.  Limiting alcohol use.  Practicing safe sex.  Taking low doses of aspirin every day if recommended by your health care provider..  Taking vitamin and mineral supplements as recommended by your health care provider. What happens during an annual well check? The services and screenings done by your health care provider during your annual well check will depend on your age, overall health, lifestyle risk factors, and family history of disease. Counseling  Your health care provider may ask you questions about your:  Alcohol use.  Tobacco use.  Drug use.  Emotional well-being.  Home and relationship well-being.  Sexual activity.  Eating habits.  History of falls.  Memory and ability to understand (cognition).  Work and work Statistician. Screening  You may have the following tests or measurements:  Height, weight, and BMI.  Blood pressure.  Lipid and cholesterol levels. These may be checked every 5 years, or more frequently if you are over 69 years old.  Skin check.  Lung cancer screening. You may have this screening every year starting at age 28 if you have a 30-pack-year history of smoking and currently smoke or have quit within the past 15 years.  Fecal occult blood test (FOBT) of the stool. You may have this test every year starting at age 18.  Flexible sigmoidoscopy or colonoscopy. You may have a  sigmoidoscopy every 5 years or a colonoscopy every 10 years starting at age 76.  Prostate cancer screening. Recommendations will vary depending on your family history and other risks.  Hepatitis C blood test.  Hepatitis B blood test.  Sexually transmitted disease  (STD) testing.  Diabetes screening. This is done by checking your blood sugar (glucose) after you have not eaten for a while (fasting). You may have this done every 1-3 years.  Abdominal aortic aneurysm (AAA) screening. You may need this if you are a current or former smoker.  Osteoporosis. You may be screened starting at age 56 if you are at high risk. Talk with your health care provider about your test results, treatment options, and if necessary, the need for more tests. Vaccines  Your health care provider may recommend certain vaccines, such as:  Influenza vaccine. This is recommended every year.  Tetanus, diphtheria, and acellular pertussis (Tdap, Td) vaccine. You may need a Td booster every 10 years.  Zoster vaccine. You may need this after age 60.  Pneumococcal 13-valent conjugate (PCV13) vaccine. One dose is recommended after age 59.  Pneumococcal polysaccharide (PPSV23) vaccine. One dose is recommended after age 56. Talk to your health care provider about which screenings and vaccines you need and how often you need them. This information is not intended to replace advice given to you by your health care provider. Make sure you discuss any questions you have with your health care provider. Document Released: 08/26/2015 Document Revised: 04/18/2016 Document Reviewed: 05/31/2015 Elsevier Interactive Patient Education  2017 [place] Prevention in the Home Falls can cause injuries. They can happen to people of all ages. There are many things you can do to make your home safe and to help prevent falls. What can I do on the outside of my home?  Regularly fix the edges of walkways and driveways and fix any cracks.  Remove anything that might make you trip as you walk through a door, such as a raised step or threshold.  Trim any bushes or trees on the path to your home.  Use bright outdoor lighting.  Clear any walking paths of anything that might make someone trip,  such as rocks or tools.  Regularly check to see if handrails are loose or broken. Make sure that both sides of any steps have handrails.  Any raised decks and porches should have guardrails on the edges.  Have any leaves, snow, or ice cleared regularly.  Use sand or salt on walking paths during winter.  Clean up any spills in your garage right away. This includes oil or grease spills. What can I do in the bathroom?  Use night lights.  Install grab bars by the toilet and in the tub and shower. Do not use towel bars as grab bars.  Use non-skid mats or decals in the tub or shower.  If you need to sit down in the shower, use a plastic, non-slip stool.  Keep the floor dry. Clean up any water that spills on the floor as soon as it happens.  Remove soap buildup in the tub or shower regularly.  Attach bath mats securely with double-sided non-slip rug tape.  Do not have throw rugs and other things on the floor that can make you trip. What can I do in the bedroom?  Use night lights.  Make sure that you have a light by your bed that is easy to reach.  Do not use any sheets or blankets that are  too big for your bed. They should not hang down onto the floor.  Have a firm chair that has side arms. You can use this for support while you get dressed.  Do not have throw rugs and other things on the floor that can make you trip. What can I do in the kitchen?  Clean up any spills right away.  Avoid walking on wet floors.  Keep items that you use a lot in easy-to-reach places.  If you need to reach something above you, use a strong step stool that has a grab bar.  Keep electrical cords out of the way.  Do not use floor polish or wax that makes floors slippery. If you must use wax, use non-skid floor wax.  Do not have throw rugs and other things on the floor that can make you trip. What can I do with my stairs?  Do not leave any items on the stairs.  Make sure that there are  handrails on both sides of the stairs and use them. Fix handrails that are broken or loose. Make sure that handrails are as long as the stairways.  Check any carpeting to make sure that it is firmly attached to the stairs. Fix any carpet that is loose or worn.  Avoid having throw rugs at the top or bottom of the stairs. If you do have throw rugs, attach them to the floor with carpet tape.  Make sure that you have a light switch at the top of the stairs and the bottom of the stairs. If you do not have them, ask someone to add them for you. What else can I do to help prevent falls?  Wear shoes that:  Do not have high heels.  Have rubber bottoms.  Are comfortable and fit you well.  Are closed at the toe. Do not wear sandals.  If you use a stepladder:  Make sure that it is fully opened. Do not climb a closed stepladder.  Make sure that both sides of the stepladder are locked into place.  Ask someone to hold it for you, if possible.  Clearly mark and make sure that you can see:  Any grab bars or handrails.  First and last steps.  Where the edge of each step is.  Use tools that help you move around (mobility aids) if they are needed. These include:  Canes.  Walkers.  Scooters.  Crutches.  Turn on the lights when you go into a dark area. Replace any light bulbs as soon as they burn out.  Set up your furniture so you have a clear path. Avoid moving your furniture around.  If any of your floors are uneven, fix them.  If there are any pets around you, be aware of where they are.  Review your medicines with your doctor. Some medicines can make you feel dizzy. This can increase your chance of falling. Ask your doctor what other things that you can do to help prevent falls. This information is not intended to replace advice given to you by your health care provider. Make sure you discuss any questions you have with your health care provider. Document Released: 05/26/2009  Document Revised: 01/05/2016 Document Reviewed: 09/03/2014 Elsevier Interactive Patient Education  2017 [employer].

## 2019-11-21 NOTE — Assessment & Plan Note (Signed)
Refilled prednisone with more flexibility for flares but he is reminded that prednisone causes bone loss and joint progression but he does wish to continue as this has been the only treatment to help him be more functional and he is caregiver for his wife.

## 2019-12-06 ENCOUNTER — Other Ambulatory Visit: Payer: Self-pay | Admitting: Internal Medicine

## 2019-12-07 DIAGNOSIS — M5136 Other intervertebral disc degeneration, lumbar region: Secondary | ICD-10-CM | POA: Diagnosis not present

## 2019-12-07 DIAGNOSIS — M48062 Spinal stenosis, lumbar region with neurogenic claudication: Secondary | ICD-10-CM | POA: Diagnosis not present

## 2019-12-07 DIAGNOSIS — M25562 Pain in left knee: Secondary | ICD-10-CM | POA: Diagnosis not present

## 2019-12-07 DIAGNOSIS — M5416 Radiculopathy, lumbar region: Secondary | ICD-10-CM | POA: Diagnosis not present

## 2019-12-10 DIAGNOSIS — C44311 Basal cell carcinoma of skin of nose: Secondary | ICD-10-CM | POA: Diagnosis not present

## 2019-12-10 DIAGNOSIS — C44329 Squamous cell carcinoma of skin of other parts of face: Secondary | ICD-10-CM | POA: Diagnosis not present

## 2019-12-10 DIAGNOSIS — D0461 Carcinoma in situ of skin of right upper limb, including shoulder: Secondary | ICD-10-CM | POA: Diagnosis not present

## 2019-12-10 DIAGNOSIS — Z85828 Personal history of other malignant neoplasm of skin: Secondary | ICD-10-CM | POA: Diagnosis not present

## 2019-12-10 DIAGNOSIS — D485 Neoplasm of uncertain behavior of skin: Secondary | ICD-10-CM | POA: Diagnosis not present

## 2019-12-10 DIAGNOSIS — L570 Actinic keratosis: Secondary | ICD-10-CM | POA: Diagnosis not present

## 2019-12-14 ENCOUNTER — Encounter: Payer: Self-pay | Admitting: Family Medicine

## 2019-12-25 DIAGNOSIS — L570 Actinic keratosis: Secondary | ICD-10-CM | POA: Diagnosis not present

## 2019-12-25 DIAGNOSIS — Z85828 Personal history of other malignant neoplasm of skin: Secondary | ICD-10-CM | POA: Diagnosis not present

## 2019-12-25 DIAGNOSIS — L820 Inflamed seborrheic keratosis: Secondary | ICD-10-CM | POA: Diagnosis not present

## 2019-12-26 ENCOUNTER — Other Ambulatory Visit: Payer: Self-pay

## 2019-12-26 ENCOUNTER — Emergency Department (HOSPITAL_COMMUNITY)
Admission: EM | Admit: 2019-12-26 | Discharge: 2019-12-27 | Disposition: A | Discharge status: Home / Self Care | Payer: Medicare Other | Attending: Emergency Medicine | Admitting: Emergency Medicine

## 2019-12-26 ENCOUNTER — Emergency Department (HOSPITAL_COMMUNITY): Payer: Medicare Other

## 2019-12-26 ENCOUNTER — Encounter (HOSPITAL_COMMUNITY): Payer: Self-pay | Admitting: Emergency Medicine

## 2019-12-26 DIAGNOSIS — I110 Hypertensive heart disease with heart failure: Secondary | ICD-10-CM | POA: Diagnosis not present

## 2019-12-26 DIAGNOSIS — S51812A Laceration without foreign body of left forearm, initial encounter: Secondary | ICD-10-CM | POA: Diagnosis not present

## 2019-12-26 DIAGNOSIS — I5032 Chronic diastolic (congestive) heart failure: Secondary | ICD-10-CM | POA: Insufficient documentation

## 2019-12-26 DIAGNOSIS — Y929 Unspecified place or not applicable: Secondary | ICD-10-CM | POA: Diagnosis not present

## 2019-12-26 DIAGNOSIS — Y999 Unspecified external cause status: Secondary | ICD-10-CM | POA: Insufficient documentation

## 2019-12-26 DIAGNOSIS — R52 Pain, unspecified: Secondary | ICD-10-CM | POA: Diagnosis not present

## 2019-12-26 DIAGNOSIS — S82002A Unspecified fracture of left patella, initial encounter for closed fracture: Secondary | ICD-10-CM | POA: Insufficient documentation

## 2019-12-26 DIAGNOSIS — Z96653 Presence of artificial knee joint, bilateral: Secondary | ICD-10-CM | POA: Diagnosis not present

## 2019-12-26 DIAGNOSIS — I10 Essential (primary) hypertension: Secondary | ICD-10-CM | POA: Diagnosis not present

## 2019-12-26 DIAGNOSIS — W010XXA Fall on same level from slipping, tripping and stumbling without subsequent striking against object, initial encounter: Secondary | ICD-10-CM | POA: Insufficient documentation

## 2019-12-26 DIAGNOSIS — S82042A Displaced comminuted fracture of left patella, initial encounter for closed fracture: Secondary | ICD-10-CM | POA: Diagnosis not present

## 2019-12-26 DIAGNOSIS — R0902 Hypoxemia: Secondary | ICD-10-CM | POA: Diagnosis not present

## 2019-12-26 DIAGNOSIS — Y939 Activity, unspecified: Secondary | ICD-10-CM | POA: Diagnosis not present

## 2019-12-26 DIAGNOSIS — Z79899 Other long term (current) drug therapy: Secondary | ICD-10-CM | POA: Insufficient documentation

## 2019-12-26 DIAGNOSIS — I2510 Atherosclerotic heart disease of native coronary artery without angina pectoris: Secondary | ICD-10-CM | POA: Diagnosis not present

## 2019-12-26 DIAGNOSIS — Z87891 Personal history of nicotine dependence: Secondary | ICD-10-CM | POA: Diagnosis not present

## 2019-12-26 DIAGNOSIS — Z743 Need for continuous supervision: Secondary | ICD-10-CM | POA: Diagnosis not present

## 2019-12-26 DIAGNOSIS — S79922A Unspecified injury of left thigh, initial encounter: Secondary | ICD-10-CM | POA: Diagnosis not present

## 2019-12-26 DIAGNOSIS — W19XXXA Unspecified fall, initial encounter: Secondary | ICD-10-CM

## 2019-12-26 DIAGNOSIS — R609 Edema, unspecified: Secondary | ICD-10-CM | POA: Diagnosis not present

## 2019-12-26 MED ORDER — LIDOCAINE-EPINEPHRINE 2 %-1:100000 IJ SOLN
20.0000 mL | Freq: Once | INTRAMUSCULAR | Status: AC
  Administered 2019-12-27: 20 mL
  Filled 2019-12-26: qty 1

## 2019-12-26 NOTE — ED Triage Notes (Addendum)
Pt is a 86 year old male who comes from home via ems, c/o a mechanical fall approximately 2 hrs ago in his basement, pt denies loc, blood thinners and endorses pain in left knee. Pt had a surgery on his left knee in September.  Pt has a lac on left arm bleeding controlled and wrapped will need sutures most likely. ,skin tear on left elbow, and left flank. Pain is verbalizes 10 out 10 in left knee per ems. Pt states pain only with movement 8 out 10. Pt v/s on arrival are bp 160/100, hr 92, rr16, spo2 96 room air. Temp 98.1.  Tympanic.  Family notified.  Medical hx of HTN.

## 2019-12-27 DIAGNOSIS — S51812A Laceration without foreign body of left forearm, initial encounter: Secondary | ICD-10-CM | POA: Diagnosis not present

## 2019-12-27 DIAGNOSIS — S79922A Unspecified injury of left thigh, initial encounter: Secondary | ICD-10-CM | POA: Diagnosis not present

## 2019-12-27 DIAGNOSIS — S82042A Displaced comminuted fracture of left patella, initial encounter for closed fracture: Secondary | ICD-10-CM | POA: Diagnosis not present

## 2019-12-27 MED ORDER — OXYCODONE HCL 5 MG PO TABS
10.0000 mg | ORAL_TABLET | Freq: Once | ORAL | Status: AC
  Administered 2019-12-27: 10 mg via ORAL
  Filled 2019-12-27: qty 2

## 2019-12-27 MED ORDER — HYDROMORPHONE HCL 1 MG/ML IJ SOLN
1.0000 mg | Freq: Once | INTRAMUSCULAR | Status: AC
  Administered 2019-12-27: 1 mg via INTRAMUSCULAR
  Filled 2019-12-27: qty 1

## 2019-12-27 NOTE — ED Provider Notes (Signed)
Emergency Department Provider Note  I have reviewed the triage vital signs and the nursing notes.  HISTORY  Chief Complaint Fall and Laceration   HPI [name] is a 86 y.o. male with a history of bilateral knee replacements last 1 in October of last year on the left who presents after fall with left knee pain.  Patient states he was doing his normal activities but his left knee gave out and he fell on it.  He has significant pain and swelling to that knee is not able to stand now because of instability.  Patient states that in between his falls in the past with his knee instability that his knee was fine but today he is not able to stand on it straight.  No injuries elsewhere.   No other associated or modifying symptoms.    Past Medical History:  Diagnosis Date  . ACTINIC KERATOSIS, HEAD 03/23/2008  . ALLERGIC RHINITIS 03/27/2007  . Arthritis   . BENIGN PROSTATIC HYPERTROPHY, WITH URINARY OBSTRUCTION 10/16/2007  . CAD 08/18/2007   "patient denies any issues with heart, does not see cardiologist"  . Carpal tunnel syndrome, bilateral 09/04/2016  . CARPAL TUNNEL SYNDROME, LEFT 04/13/2008  . Chronic back pain   . Chronic neck pain   . DVT (deep venous thrombosis) ([place])   . ERECTILE DYSFUNCTION, ORGANIC 02/20/2010  . Gout, unspecified 02/20/2010  . Headache(784.0)    migraines hx of  . History of melanoma   . HYPERLIPIDEMIA, WITH HIGH HDL 10/18/2008  . HYPERTENSION 05/09/2007   sees Dr. Benay Pillow  . KNEE PAIN 10/10/2009  . LOW BACK PAIN 03/27/2007  . NEOPLASM, MALIGNANT, SKIN, TRUNK 05/09/2007  . Patellar tendinitis 10/10/2009  . SKIN CANCER, HX OF 03/27/2007  . UNS ADVRS EFF UNS RX MEDICINAL&BIOLOGICAL SBSTNC 08/18/2007  . Urinary urgency     Patient Active Problem List   Diagnosis Date Noted  . Rib pain on left side 09/18/2019  . Arthritis of left knee 05/07/2019  . Pre-operative clearance 04/28/2019  . Acute diastolic CHF (congestive heart failure) ([place]) 08/10/2018  . Leg  wound, left 03/19/2018  . Right arm pain 11/11/2017  . Primary osteoarthritis of right foot 08/21/2017  . Syncope 02/27/2017  . Arthritis of right ankle 12/18/2016  . Carpal tunnel syndrome, bilateral 09/04/2016  . Leg swelling 08/16/2016  . Hand weakness 07/12/2016  . Degenerative arthritis of left knee 06/28/2016  . Iron deficiency anemia 03/16/2016  . Arthritis of left acromioclavicular joint 01/03/2016  . Rotator cuff disorder 01/03/2016  . Osteoarthritis of left knee 04/30/2015  . Baker's cyst of knee 04/30/2015  . Esophageal stricture 04/26/2015  . Benign prostatic hyperplasia 02/21/2015  . Constipation 02/21/2015  . Myalgia and myositis 11/18/2014  . Polyarticular arthritis 07/19/2013  . Chronic radicular low back pain 12/15/2012  . History of gout 02/20/2010  . ERECTILE DYSFUNCTION, ORGANIC 02/20/2010  . Hyperlipidemia 10/18/2008  . CAD 08/18/2007  . Essential hypertension 05/09/2007  . ALLERGIC RHINITIS 03/27/2007    Past Surgical History:  Procedure Laterality Date  . APPENDECTOMY  1969  . BACK SURGERY    . BLEPHAROPLASTY    . CARPAL TUNNEL RELEASE    . CHOLECYSTECTOMY    . ESOPHAGOGASTRODUODENOSCOPY (EGD) WITH PROPOFOL N/A 04/26/2015   Procedure: ESOPHAGOGASTRODUODENOSCOPY (EGD) WITH PROPOFOL;  Surgeon: Inda Castle, MD;  Location: [facility];  Service: Endoscopy;  Laterality: N/A;  . EYE SURGERY     bilateral cataracts removed and vitrectomies  . JOINT REPLACEMENT Right  knee  . KNEE ARTHROSCOPY    . LAMINECTOMY    . LATERAL FUSION LUMBAR SPINE, TRANSVERSE    . LUMBAR LAMINECTOMY/DECOMPRESSION MICRODISCECTOMY Left 11/03/2012   Procedure: LUMBAR LAMINECTOMY/DECOMPRESSION MICRODISCECTOMY 1 LEVEL;  Surgeon: Hosie Spangle, MD;  Location: [facility];  Service: Neurosurgery;  Laterality: Left;  LEFT L5S1 laminotomy foraminotomy and possible microdiskectomy  . MELANOMA EXCISION    . NASAL SINUS SURGERY    . POSTERIOR CERVICAL FUSION/FORAMINOTOMY Right  10/01/2012   Procedure: POSTERIOR CERVICAL FUSION/FORAMINOTOMY LEVEL 1;  Surgeon: Hosie Spangle, MD;  Location: [place] [facility];  Service: Neurosurgery;  Laterality: Right;  Right Cervical seven thoracic one Cervical laminectomy/foraminotomy with posterior cervical arthrodesis   . POSTERIOR LUMBAR FUSION N/A 05/23/2013   Procedure: exploration of lumbar wound, explantation of left interbody implant.;  Surgeon: Hosie Spangle, MD;  Location: [facility];  Service: Neurosurgery;  Laterality: N/A;  . RECTAL SURGERY     Fissure  . TOTAL KNEE ARTHROPLASTY Left 05/07/2019   Procedure: LEFT TOTAL KNEE ARTHROPLASTY;  Surgeon: [name], MD;  Location: [facility];  Service: Orthopedics;  Laterality: Left;  Marland Kitchen VITRECTOMY     right and left 2013    Current Outpatient Rx  . Order #: [id number] Class: Normal  . Order #: [id number] Class: Normal  . Order #: [id number] Class: Historical Med  . Order #: [id number] Class: Normal  . Order #: [id number] Class: Normal  . Order #: [id number] Class: Normal  . Order #: [id number] Class: Normal  . Order #: [id number] Class: Historical Med  . Order #: [id number] Class: Normal  . Order #: [id number] Class: Historical Med  . Order #: [id number] Class: Normal  . Order #: [id number] Class: Historical Med  . Order #: [id number] Class: Normal  . Order #: [id number] Class: Normal  . Order #: [id number] Class: Normal  . Order #: [id number] Class: Normal  . Order #: [id number] Class: Normal    Allergies Amlodipine, Gabapentin, and Lyrica [pregabalin]  Family History  Problem Relation Age of Onset  . Dementia Mother   . Anemia Father   . Heart disease Father   . Stroke Sister        ICH  . Lung disease Sister     Social History Social History   Tobacco Use  . Smoking status: Former Smoker    Packs/day: 1.00    Years: 33.00    Pack years: 33.00    Quit date: 09/29/1981    Years since quitting: 38.2  . Smokeless tobacco: Never Used  Substance Use Topics  . Alcohol use: Yes     Alcohol/week: 3.0 standard drinks    Types: 3 Standard drinks or equivalent per week    Comment: Social  . Drug use: No    Review of Systems  All other systems negative except as documented in the HPI. All pertinent positives and negatives as reviewed in the HPI. ____________________________________________  PHYSICAL EXAM:  VITAL SIGNS: ED Triage Vitals  Enc Vitals Group     BP 12/26/19 2323 (!) 150/85     Pulse Rate 12/26/19 2323 87     Resp 12/26/19 2323 16     Temp 12/26/19 2323 98.2 F (36.8 C)     Temp Source 12/26/19 2323 Oral     SpO2 12/26/19 2323 96 %    Constitutional: Alert and oriented. Well appearing and in no acute distress. Eyes: Conjunctivae are normal. PERRL. EOMI. Head: Atraumatic. Nose: No congestion/rhinnorhea. Mouth/Throat: Mucous membranes are moist.  Oropharynx non-erythematous. Neck: No stridor.  No meningeal signs.   Cardiovascular: Normal rate,  regular rhythm. Good peripheral circulation. Grossly normal heart sounds.   Respiratory: Normal respiratory effort.  No retractions. Lungs CTAB. Gastrointestinal: Soft and nontender. No distention.  Musculoskeletal: significant Left knee edema, straight leg raise not able to be performed. Neurologic:  Normal speech and language. No gross focal neurologic deficits are appreciated.  Skin:  9 cm laceration to left forearm with associated avulsions and abrasions. Left elbow with multiple abrasions. Left flank with 3cm circular area of ecchymosis and associated abrasion.  ____________________________________________   RADIOLOGY  DG Tibia/Fibula Left  Result Date: 12/27/2019 CLINICAL DATA:  Fall EXAM: LEFT TIBIA AND FIBULA - 2 VIEW COMPARISON:  None. FINDINGS: There is a comminuted periprosthetic fracture of the left patella. Large amount of soft tissue swelling. The femorotibial components of the knee arthroplasty are approximated. IMPRESSION: Comminuted periprosthetic fracture of the left patella. Electronically  Signed   By: Ulyses Jarred M.D.   On: 12/27/2019 01:01   DG Femur Min 2 Views Left  Result Date: 12/27/2019 CLINICAL DATA:  Fall EXAM: LEFT FEMUR 2 VIEWS COMPARISON:  November 06, 2019 FINDINGS: There now appears to be distraction of the fracture fragments at the inferior patellar pole periprosthetic fracture of approximately 2.6 cm. There is superior subluxation of the remainder of the patella. Diffuse overlying soft tissue swelling and a joint effusion. There is moderate left hip osteoarthritis. IMPRESSION: Interval distraction of the inferior patellar pole periprosthetic fracture since the prior exam with new small fracture fragments. There is superior subluxation of the remainder of the patella. Significant overlying soft tissue swelling. Electronically Signed   By: Prudencio Pair M.D.   On: 12/27/2019 00:44   ____________________________________________  PROCEDURES  Procedure(s) performed:   Marland KitchenMarland KitchenLaceration Repair  Date/Time: 12/27/2019 3:07 AM Performed by: Merrily Pew, MD Authorized by: Merrily Pew, MD   Consent:    Consent obtained:  Verbal   Consent given by:  Patient   Risks discussed:  Infection, need for additional repair, nerve damage, poor wound healing, poor cosmetic result, pain, retained foreign body, tendon damage and vascular damage   Alternatives discussed:  No treatment, delayed treatment and observation Anesthesia (see MAR for exact dosages):    Anesthesia method:  Local infiltration   Local anesthetic:  Lidocaine 2% WITH epi Repair type:    Repair type:  Simple Pre-procedure details:    Preparation:  Patient was prepped and draped in usual sterile fashion and imaging obtained to evaluate for foreign bodies Exploration:    Wound exploration: wound explored through full range of motion and entire depth of wound probed and visualized     Contaminated: no   Treatment:    Area cleansed with:  Saline and Shur-Clens   Amount of cleaning:  Extensive   Irrigation  solution:  Sterile water   Irrigation volume:  200   Irrigation method:  Syringe   Visualized foreign bodies/material removed: no   Skin repair:    Repair method:  Sutures and Steri-Strips   Suture size:  4-0   Suture material:  Prolene   Suture technique:  Simple interrupted   Number of sutures:  8   Number of Steri-Strips:  6 Approximation:    Approximation:  Close Post-procedure details:    Dressing:  Non-adherent dressing and bulky dressing   Patient tolerance of procedure:  Tolerated well, no immediate complications   ____________________________________________  INITIAL IMPRESSION / ASSESSMENT AND PLAN / ED COURSE   This patient presents to the ED for concern of fall and laceration with left knee  pai, this involves an extensive number of treatment options, and is a complaint that carries with it a high risk of complications and morbidity.  The differential diagnosis includes fracture vs contusion     Lab Tests:   I Ordered, reviewed, and interpreted labs, which included none indicated.    Medicines ordered:   I ordered medication oxycodone for pain and lidocaine for sutring.    Imaging Studies ordered:   I independently visualized and interpreted imaging xr's which showed patellar fracture  Additional history obtained:   Additional history obtained from noone  Previous records obtained and reviewed in epic  Consultations Obtained:   I consulted Dr. Sharol Given (Dr. Randel Pigg partner)  and discussed lab and imaging findings and he agreed with immobilization, crutches and close follow up with dr. Marlou Sa.   Reevaluation:  After the interventions stated above, I reevaluated the patient and found improved.   While patient was reposition himself in the bed part of the monitor detached and fell hitting him directly in the mouth.  He suffered a laceration to his inner lip which will not need to be repaired however also now has a loose left front incisor which was not there  previously.  Likely has a fracture there.  Is stable enough to not need immediate removal or stabilization but is obviously loose and will need close follow-up.  Will contact service excellence to contact the patient.   A medical screening exam was performed and I feel the patient has had an appropriate workup for their chief complaint at this time and likelihood of emergent condition existing is low. They have been counseled on decision, discharge, follow up and which symptoms necessitate immediate return to the emergency department. They or their family verbally stated understanding and agreement with plan and discharged in stable condition.   ____________________________________________  FINAL CLINICAL IMPRESSION(S) / ED DIAGNOSES  Final diagnoses:  Fall, initial encounter  Laceration of left forearm, initial encounter  Closed nondisplaced fracture of left patella, unspecified fracture morphology, initial encounter    MEDICATIONS GIVEN DURING THIS VISIT:  Medications  lidocaine-EPINEPHrine (XYLOCAINE W/EPI) 2 %-1:100000 (with pres) injection 20 mL (20 mLs Infiltration Given by Other 12/27/19 0249)  oxyCODONE (Oxy IR/ROXICODONE) immediate release tablet 10 mg (10 mg Oral Given 12/27/19 0123)    NEW OUTPATIENT MEDICATIONS STARTED DURING THIS VISIT:  New Prescriptions   No medications on file    Note:  This note was prepared with assistance of Dragon voice recognition software. Occasional wrong-word or sound-a-like substitutions may have occurred due to the inherent limitations of voice recognition software.   Shaya Reddick, [name], MD 12/27/19 [phone]

## 2019-12-27 NOTE — ED Notes (Signed)
See note

## 2019-12-27 NOTE — ED Notes (Signed)
Patient sat on side of bed to use urinal

## 2019-12-31 ENCOUNTER — Other Ambulatory Visit: Payer: Self-pay | Admitting: Internal Medicine

## 2019-12-31 NOTE — Telephone Encounter (Signed)
Called pt he states no he is not out.. he has more than enough. Pt states he think this is an old script that's bee on file w/automatic refill. Inform will deny AND HAVE PHARMACY TO CANCEL AUTOMATIC REFILL.Marland Kitchen/LMB

## 2019-12-31 NOTE — Telephone Encounter (Signed)
pls advise if ok to refill../lmb 

## 2019-12-31 NOTE — Telephone Encounter (Signed)
Is he out yet, he is supposed to be only taking 1 daily with rare extra for flare?

## 2020-01-06 ENCOUNTER — Other Ambulatory Visit: Payer: Self-pay

## 2020-01-06 ENCOUNTER — Ambulatory Visit (INDEPENDENT_AMBULATORY_CARE_PROVIDER_SITE_OTHER): Payer: Medicare Other | Admitting: Orthopedic Surgery

## 2020-01-06 ENCOUNTER — Encounter: Payer: Self-pay | Admitting: Orthopedic Surgery

## 2020-01-06 DIAGNOSIS — S82032G Displaced transverse fracture of left patella, subsequent encounter for closed fracture with delayed healing: Secondary | ICD-10-CM | POA: Diagnosis not present

## 2020-01-06 DIAGNOSIS — Z85828 Personal history of other malignant neoplasm of skin: Secondary | ICD-10-CM | POA: Diagnosis not present

## 2020-01-06 DIAGNOSIS — C44311 Basal cell carcinoma of skin of nose: Secondary | ICD-10-CM | POA: Diagnosis not present

## 2020-01-06 DIAGNOSIS — C44329 Squamous cell carcinoma of skin of other parts of face: Secondary | ICD-10-CM | POA: Diagnosis not present

## 2020-01-07 NOTE — Progress Notes (Signed)
Office Visit Note   Patient: [name]           Date of Birth: [birth year]           MRN: [id number] Visit Date: 01/06/2020 Requested by: Hoyt Koch, MD [address] [place],  [place] [address] PCP: Hoyt Koch, MD  Subjective: Chief Complaint  Patient presents with  . Left Knee - Pain    HPI: [name] is an 86 year old patient who underwent left total knee replacement in September.  He had a fall about 6 months ago and then 3 months ago.  Had nondisplaced inferior pole patella fracture which was treated with weightbearing in a knee immobilizer.  He was doing well until his recent fall a week ago.  He does not use any assistive devices.  Takes oxycodone mostly for his back.  He does care for his wife who is at home.  Radiographs after his most recent fall demonstrate displaced inferior pole patella fracture.  Patient presents now for operative management and evaluation of extensor mechanism dysfunction following multiple falls.              ROS: All systems reviewed are negative as they relate to the chief complaint within the history of present illness.  Patient denies  fevers or chills.   Assessment & Plan: Visit Diagnoses: No diagnosis found.  Plan: Impression is left knee patella fracture with extensor mechanism disruption.  The patient is able to get by with forward walking only.  I think he has to balance the risk of left leg instability from extensor mechanism disruption and sustaining further injury versus the risk of attempted fixation of this patellar fragment.  Patient understands the risk and benefits of each of these.  He would like to have this fixed which I think in general is the better of 2 relatively poor options.  We will plan to do this next week.  Plan to keep him overnight for 2 doses of IV antibiotics and we will also use topical vancomycin to try and decrease the risk of infection.  All questions answered  Follow-Up Instructions: No  follow-ups on file.   Orders:  No orders of the defined types were placed in this encounter.  No orders of the defined types were placed in this encounter.     Procedures: No procedures performed   Clinical Data: No additional findings.  Objective: Vital Signs: There were no vitals taken for this visit.  Physical Exam:   Constitutional: Patient appears well-developed HEENT:  Head: Normocephalic Eyes:EOM are normal Neck: Normal range of motion Cardiovascular: Normal rate Pulmonary/chest: Effort normal Neurologic: Patient is alert Skin: Skin is warm Psychiatric: Patient has normal mood and affect    Ortho Exam: Ortho exam demonstrates 60 degree extensor lag.  Bruising and ecchymosis is present over the anterior knee.  Compartments are soft.  Collaterals are stable.  Patella has migrated slightly proximally.  Specialty Comments:  No specialty comments available.  Imaging: No results found.   PMFS History: Patient Active Problem List   Diagnosis Date Noted  . Rib pain on left side 09/18/2019  . Arthritis of left knee 05/07/2019  . Pre-operative clearance 04/28/2019  . Acute diastolic CHF (congestive heart failure) ([place]) 08/10/2018  . Leg wound, left 03/19/2018  . Right arm pain 11/11/2017  . Primary osteoarthritis of right foot 08/21/2017  . Syncope 02/27/2017  . Arthritis of right ankle 12/18/2016  . Carpal tunnel syndrome, bilateral 09/04/2016  . Leg swelling  08/16/2016  . Hand weakness 07/12/2016  . Degenerative arthritis of left knee 06/28/2016  . Iron deficiency anemia 03/16/2016  . Arthritis of left acromioclavicular joint 01/03/2016  . Rotator cuff disorder 01/03/2016  . Osteoarthritis of left knee 04/30/2015  . Baker's cyst of knee 04/30/2015  . Esophageal stricture 04/26/2015  . Benign prostatic hyperplasia 02/21/2015  . Constipation 02/21/2015  . Myalgia and myositis 11/18/2014  . Polyarticular arthritis 07/19/2013  . Chronic radicular low  back pain 12/15/2012  . History of gout 02/20/2010  . ERECTILE DYSFUNCTION, ORGANIC 02/20/2010  . Hyperlipidemia 10/18/2008  . CAD 08/18/2007  . Essential hypertension 05/09/2007  . ALLERGIC RHINITIS 03/27/2007   Past Medical History:  Diagnosis Date  . ACTINIC KERATOSIS, HEAD 03/23/2008  . ALLERGIC RHINITIS 03/27/2007  . Arthritis   . BENIGN PROSTATIC HYPERTROPHY, WITH URINARY OBSTRUCTION 10/16/2007  . CAD 08/18/2007   "patient denies any issues with heart, does not see cardiologist"  . Carpal tunnel syndrome, bilateral 09/04/2016  . CARPAL TUNNEL SYNDROME, LEFT 04/13/2008  . Chronic back pain   . Chronic neck pain   . DVT (deep venous thrombosis) ([place])   . ERECTILE DYSFUNCTION, ORGANIC 02/20/2010  . Gout, unspecified 02/20/2010  . Headache(784.0)    migraines hx of  . History of melanoma   . HYPERLIPIDEMIA, WITH HIGH HDL 10/18/2008  . HYPERTENSION 05/09/2007   sees Dr. Benay Pillow  . KNEE PAIN 10/10/2009  . LOW BACK PAIN 03/27/2007  . NEOPLASM, MALIGNANT, SKIN, TRUNK 05/09/2007  . Patellar tendinitis 10/10/2009  . SKIN CANCER, HX OF 03/27/2007  . UNS ADVRS EFF UNS RX MEDICINAL&BIOLOGICAL SBSTNC 08/18/2007  . Urinary urgency     Family History  Problem Relation Age of Onset  . Dementia Mother   . Anemia Father   . Heart disease Father   . Stroke Sister        ICH  . Lung disease Sister     Past Surgical History:  Procedure Laterality Date  . APPENDECTOMY  1969  . BACK SURGERY    . BLEPHAROPLASTY    . CARPAL TUNNEL RELEASE    . CHOLECYSTECTOMY    . ESOPHAGOGASTRODUODENOSCOPY (EGD) WITH PROPOFOL N/A 04/26/2015   Procedure: ESOPHAGOGASTRODUODENOSCOPY (EGD) WITH PROPOFOL;  Surgeon: Inda Castle, MD;  [facility];  Service: Endoscopy;  Laterality: N/A;  . EYE SURGERY     bilateral cataracts removed and vitrectomies  . JOINT REPLACEMENT Right    knee  . KNEE ARTHROSCOPY    . LAMINECTOMY    . LATERAL FUSION LUMBAR SPINE, TRANSVERSE    . LUMBAR  LAMINECTOMY/DECOMPRESSION MICRODISCECTOMY Left 11/03/2012   Procedure: LUMBAR LAMINECTOMY/DECOMPRESSION MICRODISCECTOMY 1 LEVEL;  Surgeon: Hosie Spangle, MD;  Location: [facility];  Service: Neurosurgery;  Laterality: Left;  LEFT L5S1 laminotomy foraminotomy and possible microdiskectomy  . MELANOMA EXCISION    . NASAL SINUS SURGERY    . POSTERIOR CERVICAL FUSION/FORAMINOTOMY Right 10/01/2012   Procedure: POSTERIOR CERVICAL FUSION/FORAMINOTOMY LEVEL 1;  Surgeon: Hosie Spangle, MD;  Location: [facility] [facility];  Service: Neurosurgery;  Laterality: Right;  Right Cervical seven thoracic one Cervical laminectomy/foraminotomy with posterior cervical arthrodesis   . POSTERIOR LUMBAR FUSION N/A 05/23/2013   Procedure: exploration of lumbar wound, explantation of left interbody implant.;  Surgeon: Hosie Spangle, MD;  Location: [facility];  Service: Neurosurgery;  Laterality: N/A;  . RECTAL SURGERY     Fissure  . TOTAL KNEE ARTHROPLASTY Left 05/07/2019   Procedure: LEFT TOTAL KNEE ARTHROPLASTY;  Surgeon: Marcene Duos  Nicki Reaper, MD;  Location: [facility];  Service: Orthopedics;  Laterality: Left;  Marland Kitchen VITRECTOMY     right and left 2013   Social History   Occupational History  . Occupation: Retired  Tobacco Use  . Smoking status: Former Smoker    Packs/day: 1.00    Years: 33.00    Pack years: 33.00    Quit date: 09/29/1981    Years since quitting: 38.2  . Smokeless tobacco: Never Used  Substance and Sexual Activity  . Alcohol use: Yes    Alcohol/week: 3.0 standard drinks    Types: 3 Standard drinks or equivalent per week    Comment: Social  . Drug use: No  . Sexual activity: Yes    Partners: Female    Comment: Married

## 2020-01-08 ENCOUNTER — Other Ambulatory Visit: Payer: Self-pay

## 2020-01-13 ENCOUNTER — Other Ambulatory Visit: Payer: Self-pay | Admitting: Internal Medicine

## 2020-01-13 IMAGING — CT CT ANGIO CHEST
2 of 3 series · 19 of 36 positions shown · IV contrast (isovue)
Comparison: Chest x-ray on 08/10/2018

CLINICAL DATA: Pt c/o SOB and hypertension off and on for a couple
weeks Isovue 370 100ml^100mL DKZIEA-79C IOPAMIDOL (DKZIEA-79C)
INJECTION 76%

EXAM:
CT ANGIOGRAPHY CHEST WITH CONTRAST
TECHNIQUE: Multidetector CT imaging of the chest was performed using the
standard protocol during bolus administration of intravenous
contrast. Multiplanar CT image reconstructions and MIPs were
obtained to evaluate the vascular anatomy.
CONTRAST:  100mL DKZIEA-79C IOPAMIDOL (DKZIEA-79C) INJECTION 76%

[Series 7: pe thins · axial · 0.73mm/px · z∈[-348,-75]mm · 16 of 434 slices shown]
[im 22/434  lung]
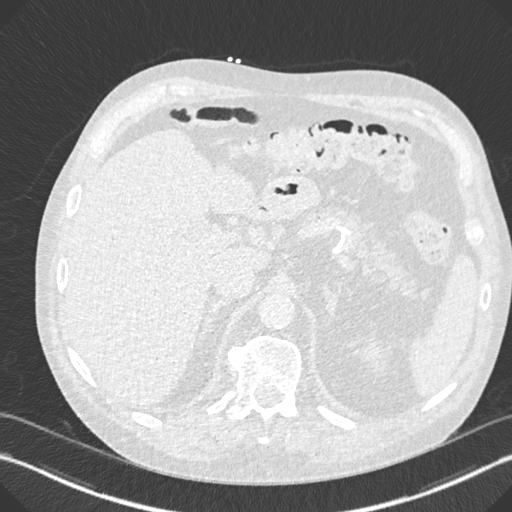
[im 44/434  mediastinal]
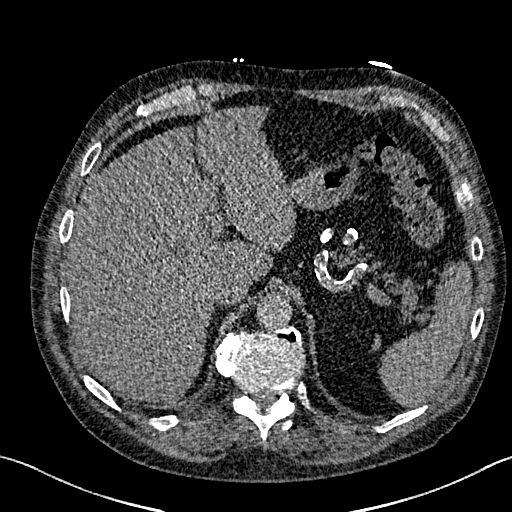
[im 87/434  lung]
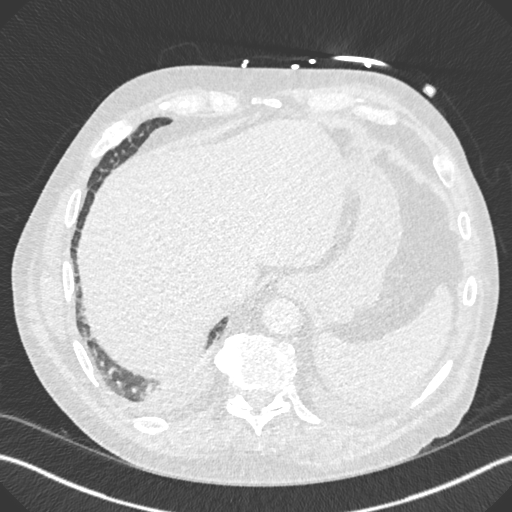
[im 109/434  mediastinal]
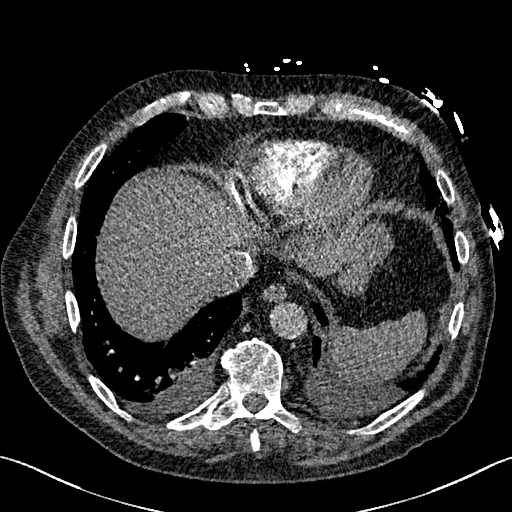
[im 130/434  lung]
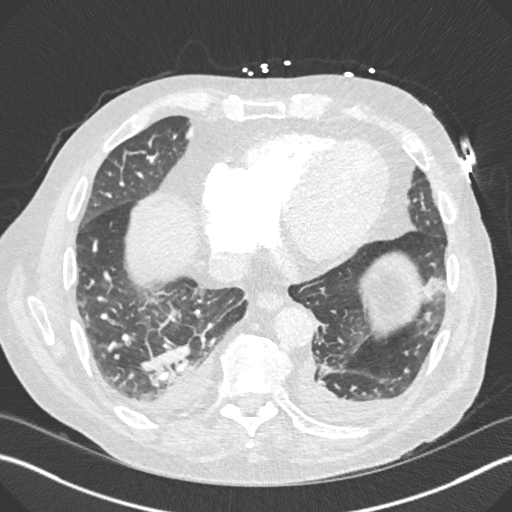
[im 152/434  mediastinal]
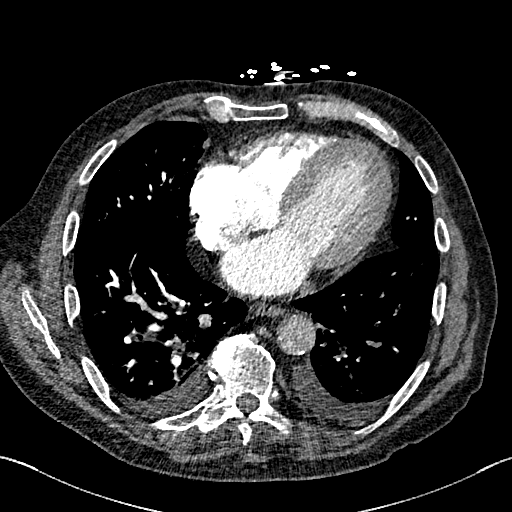
[im 195/434  lung]
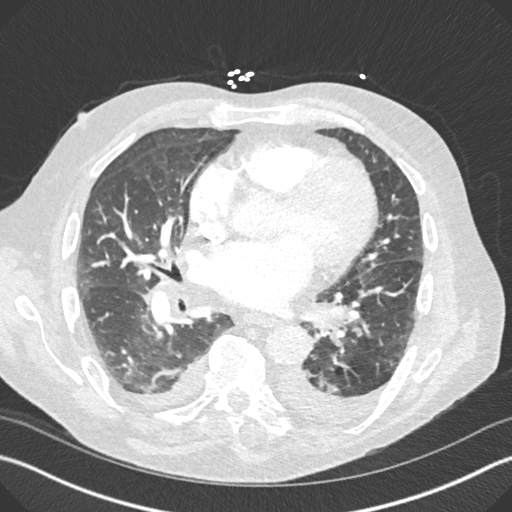
[im 204/434  mediastinal]
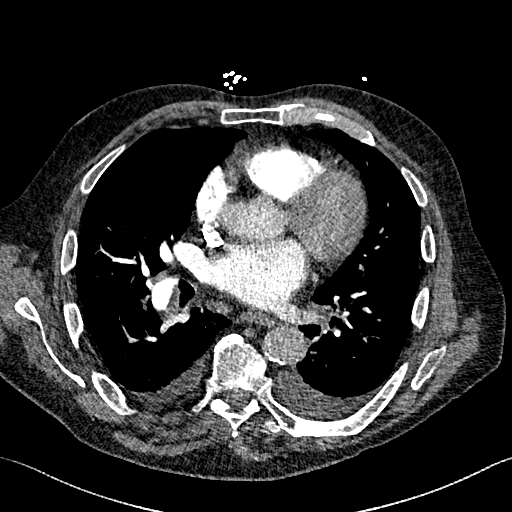
[im 217/434  lung]
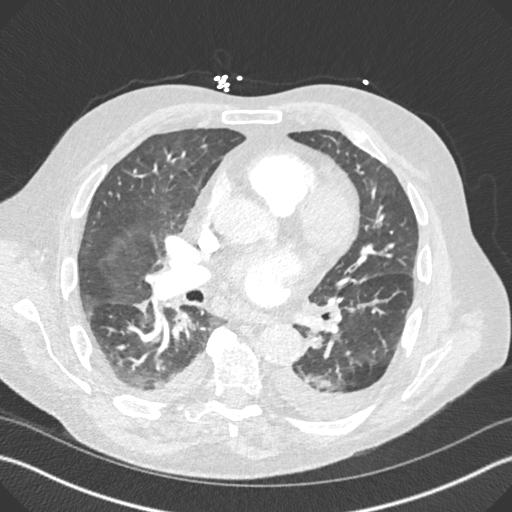
[im 239/434  mediastinal]
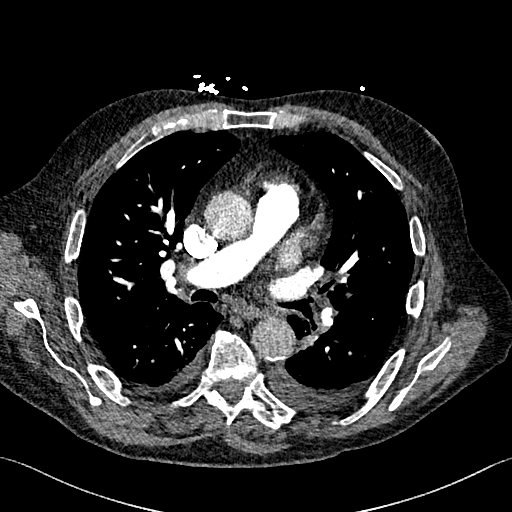
[im 282/434  lung]
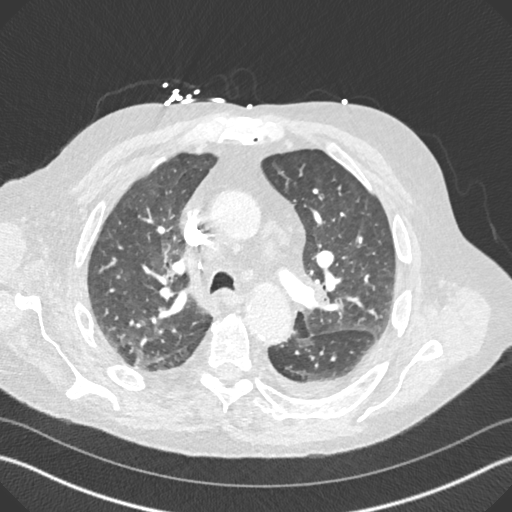
[im 304/434  mediastinal]
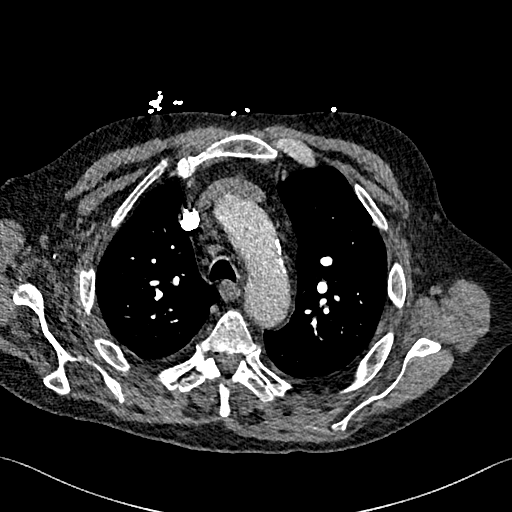
[im 325/434  lung]
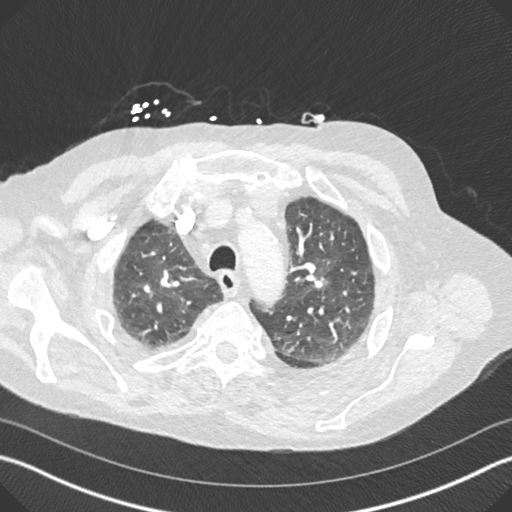
[im 347/434  mediastinal]
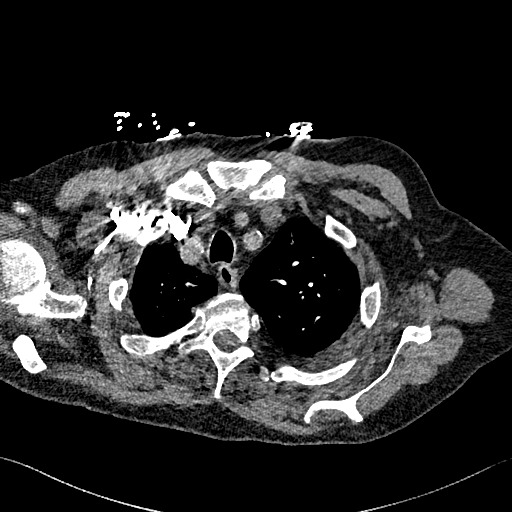
[im 390/434  lung]
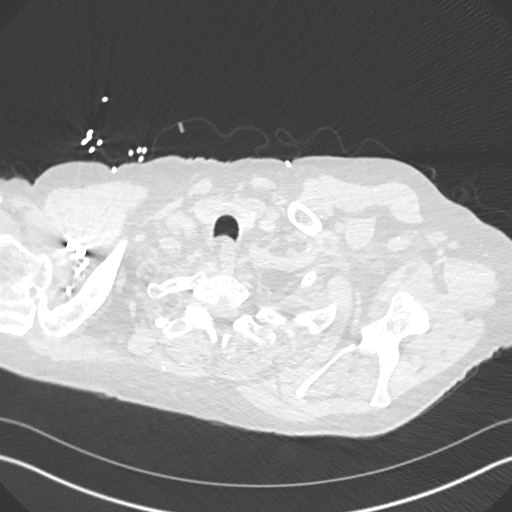
[im 412/434  mediastinal]
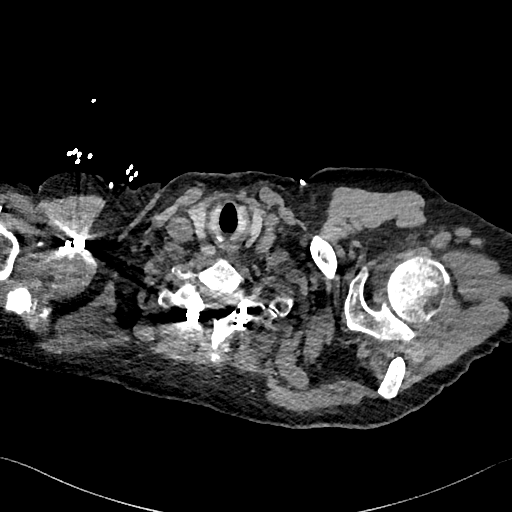

[Series 8: pe 2mm cor · coronal · 0.59mm/px · 3 of 150 slices shown]
[im 30/150  mediastinal]
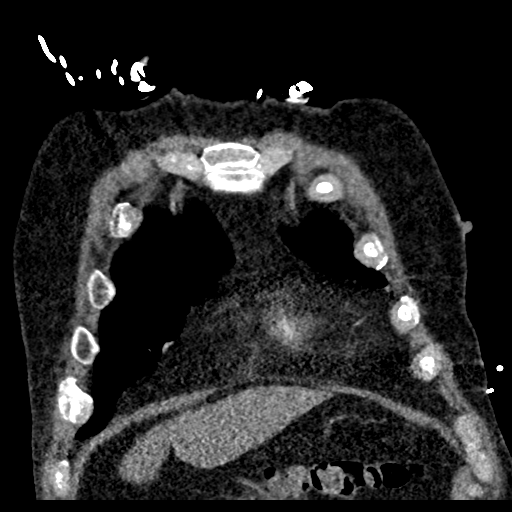
[im 60/150  mediastinal]
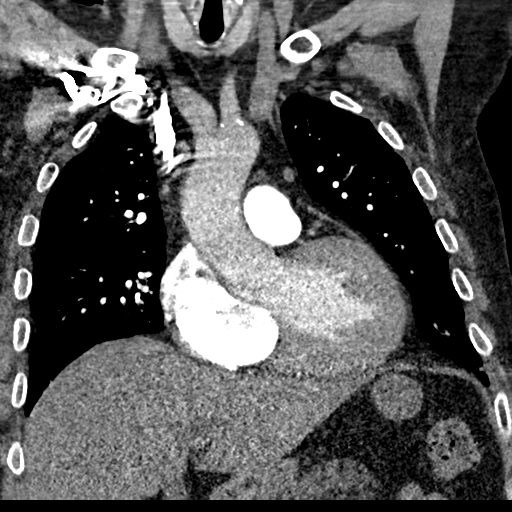
[im 90/150  mediastinal]
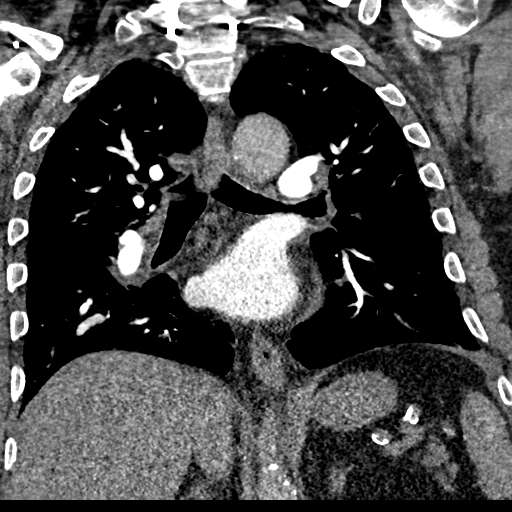

[19 of 36 positions shown; findings below may reference images not displayed]

FINDINGS: Cardiovascular: Satisfactory opacification of the pulmonary arteries
to the segmental level. No evidence of pulmonary embolism. Normal
heart size. No pericardial effusion. There is coronary artery
calcification. Heart size is normal. There is atherosclerotic
calcification of the thoracic aorta not associated with aneurysm.
Mildly tortuous descending aorta.

Mediastinum/Nodes: No enlarged mediastinal, hilar, or axillary lymph
nodes. Thyroid gland, trachea, and esophagus demonstrate no
significant findings.

Lungs/Pleura: There are bilateral pleural effusions. Thickened
interseptal lines are identified primarily with a basilar
predominance. There are dependent ground-glass opacities. Findings
are consistent with mild pulmonary edema. No suspicious pulmonary
nodules. Subsegmental atelectasis at the lung bases. No frank
consolidations.

Upper Abdomen: There is diffuse low-attenuation of the liver,
consistent with hepatic steatosis. There is dense atherosclerotic
calcification of the splenic artery. No acute abnormality in the
UPPER abdomen.

Musculoskeletal: Moderate degenerative changes in the thoracic
spine. No suspicious lytic or blastic lesions are identified.

Review of the MIP images confirms the above findings.
IMPRESSION: 1. Technically adequate exam showing no acute pulmonary embolus.
2. Mild pulmonary edema.
3. Coronary artery disease.
4. Hepatic steatosis.

Aortic atherosclerosis.  ([id number]-NK3.3)

## 2020-01-15 ENCOUNTER — Other Ambulatory Visit (HOSPITAL_COMMUNITY)
Admission: RE | Admit: 2020-01-15 | Discharge: 2020-01-15 | Disposition: A | Discharge status: Home / Self Care | Payer: Medicare Other | Source: Ambulatory Visit | Attending: Orthopedic Surgery | Admitting: Orthopedic Surgery

## 2020-01-15 DIAGNOSIS — Z01812 Encounter for preprocedural laboratory examination: Secondary | ICD-10-CM | POA: Diagnosis not present

## 2020-01-15 DIAGNOSIS — Z20822 Contact with and (suspected) exposure to covid-19: Secondary | ICD-10-CM | POA: Diagnosis not present

## 2020-01-16 LAB — SARS CORONAVIRUS 2 (TAT 6-24 HRS): SARS Coronavirus 2: NEGATIVE

## 2020-01-17 ENCOUNTER — Other Ambulatory Visit: Payer: Self-pay | Admitting: Internal Medicine

## 2020-01-18 ENCOUNTER — Other Ambulatory Visit: Payer: Self-pay

## 2020-01-18 ENCOUNTER — Encounter (HOSPITAL_COMMUNITY): Payer: Self-pay | Admitting: Orthopedic Surgery

## 2020-01-18 NOTE — Progress Notes (Signed)
Spoke with [name] for pre-op call. [name] denies cardiac history or diabetes. States he's never had a heart cath done.   [name] is concerned about being intubated because he had a mouth injury on 01/02/20 (at [facility] ED) when a metal box fell and hit him in the mouth. He has 2 loose front teeth and will be having surgery sometime this month. He wants to make sure the anesthesiologist is aware of this.   Covid test done on 01/15/20 and it's negative. [name] states he's been in quarantine since the test was done and understands that he stays in quarantine until he comes to the hospital tomorrow.

## 2020-01-18 NOTE — Anesthesia Preprocedure Evaluation (Addendum)
Anesthesia Evaluation  Patient identified by MRN, date of birth, ID band Patient awake  General Assessment Comment:Hx/o tooth damage   Reviewed: Allergy & Precautions, NPO status , Patient's Chart, lab work & pertinent test results, reviewed documented beta blocker date and time   History of Anesthesia Complications (+) history of anesthetic complications  Airway Mallampati: I  TM Distance: >3 FB Neck ROM: Full    Dental  (+) Loose,  Loose central incisors:   Pulmonary former smoker,    Pulmonary exam normal breath sounds clear to auscultation       Cardiovascular hypertension, Pt. on medications and Pt. on home beta blockers + CAD and +CHF  Normal cardiovascular exam Rhythm:Regular Rate:Normal  Echo 08/10/18 Left ventricle: The cavity size was mildly dilated. Wall thickness was increased in a pattern of mild LVH. Systolicfunction was normal. The estimated ejection fraction was in therange of 55% to 60%. Doppler parameters are consistent withabnormal left ventricular relaxation (grade 1 diastolicdysfunction).  - Aortic valve: Valve area (VTI): 3.24 cm^2. Valve area (Vmax): 3.01 cm^2. Valve area (Vmean): 3.18 cm^2.  - Mitral valve: There was mild regurgitation.  - Left atrium: The atrium was mildly dilated.  - Pericardium, extracardiac: A trivial pericardial effusion was identified.    Neuro/Psych  Headaches,  Neuromuscular disease negative psych ROS   GI/Hepatic negative GI ROS, Neg liver ROS,   Endo/Other  Hyperlipidemia Gout  Renal/GU negative Renal ROS   ED BPH    Musculoskeletal  (+) Arthritis , Osteoarthritis,  Fx left patella   Abdominal   Peds  Hematology negative hematology ROS (+) anemia ,   Anesthesia Other Findings   Reproductive/Obstetrics                            Anesthesia Physical Anesthesia Plan  ASA: II  Anesthesia Plan: General   Post-op Pain Management:   Regional for Post-op pain   Induction: Intravenous  PONV Risk Score and Plan: 3 and Ondansetron and Treatment may vary due to age or medical condition  Airway Management Planned: LMA  Additional Equipment:   Intra-op Plan:   Post-operative Plan: Extubation in OR  Informed Consent: I have reviewed the patients History and Physical, chart, labs and discussed the procedure including the risks, benefits and alternatives for the proposed anesthesia with the patient or authorized representative who has indicated his/her understanding and acceptance.     Dental advisory given  Plan Discussed with: CRNA and Surgeon  Anesthesia Plan Comments:        Anesthesia Quick Evaluation

## 2020-01-19 ENCOUNTER — Observation Stay (HOSPITAL_COMMUNITY)
Admission: RE | Admit: 2020-01-19 | Discharge: 2020-01-20 | Disposition: A | Discharge status: Home / Self Care | Payer: Medicare Other | Attending: Orthopedic Surgery | Admitting: Orthopedic Surgery

## 2020-01-19 ENCOUNTER — Observation Stay (HOSPITAL_COMMUNITY): Payer: Medicare Other

## 2020-01-19 ENCOUNTER — Ambulatory Visit (HOSPITAL_COMMUNITY): Payer: Medicare Other | Admitting: Registered Nurse

## 2020-01-19 ENCOUNTER — Encounter (HOSPITAL_COMMUNITY): Payer: Self-pay | Admitting: Orthopedic Surgery

## 2020-01-19 ENCOUNTER — Encounter (HOSPITAL_COMMUNITY)
Admission: RE | Disposition: A | Discharge status: Home / Self Care | Payer: Self-pay | Source: Home / Self Care | Attending: Orthopedic Surgery

## 2020-01-19 DIAGNOSIS — I509 Heart failure, unspecified: Secondary | ICD-10-CM | POA: Insufficient documentation

## 2020-01-19 DIAGNOSIS — N401 Enlarged prostate with lower urinary tract symptoms: Secondary | ICD-10-CM | POA: Insufficient documentation

## 2020-01-19 DIAGNOSIS — I2510 Atherosclerotic heart disease of native coronary artery without angina pectoris: Secondary | ICD-10-CM | POA: Diagnosis not present

## 2020-01-19 DIAGNOSIS — G8929 Other chronic pain: Secondary | ICD-10-CM | POA: Insufficient documentation

## 2020-01-19 DIAGNOSIS — S82002A Unspecified fracture of left patella, initial encounter for closed fracture: Secondary | ICD-10-CM | POA: Diagnosis not present

## 2020-01-19 DIAGNOSIS — Z79899 Other long term (current) drug therapy: Secondary | ICD-10-CM | POA: Insufficient documentation

## 2020-01-19 DIAGNOSIS — Z87891 Personal history of nicotine dependence: Secondary | ICD-10-CM | POA: Insufficient documentation

## 2020-01-19 DIAGNOSIS — D649 Anemia, unspecified: Secondary | ICD-10-CM | POA: Insufficient documentation

## 2020-01-19 DIAGNOSIS — I110 Hypertensive heart disease with heart failure: Secondary | ICD-10-CM | POA: Insufficient documentation

## 2020-01-19 DIAGNOSIS — I5031 Acute diastolic (congestive) heart failure: Secondary | ICD-10-CM | POA: Diagnosis not present

## 2020-01-19 DIAGNOSIS — J309 Allergic rhinitis, unspecified: Secondary | ICD-10-CM | POA: Diagnosis not present

## 2020-01-19 DIAGNOSIS — W19XXXA Unspecified fall, initial encounter: Secondary | ICD-10-CM | POA: Insufficient documentation

## 2020-01-19 DIAGNOSIS — M549 Dorsalgia, unspecified: Secondary | ICD-10-CM | POA: Diagnosis not present

## 2020-01-19 DIAGNOSIS — N138 Other obstructive and reflux uropathy: Secondary | ICD-10-CM | POA: Diagnosis not present

## 2020-01-19 DIAGNOSIS — G8918 Other acute postprocedural pain: Secondary | ICD-10-CM | POA: Diagnosis not present

## 2020-01-19 DIAGNOSIS — Z85828 Personal history of other malignant neoplasm of skin: Secondary | ICD-10-CM | POA: Insufficient documentation

## 2020-01-19 DIAGNOSIS — Z85820 Personal history of malignant melanoma of skin: Secondary | ICD-10-CM | POA: Insufficient documentation

## 2020-01-19 DIAGNOSIS — M542 Cervicalgia: Secondary | ICD-10-CM | POA: Insufficient documentation

## 2020-01-19 DIAGNOSIS — Z96652 Presence of left artificial knee joint: Secondary | ICD-10-CM | POA: Diagnosis not present

## 2020-01-19 DIAGNOSIS — S82042G Displaced comminuted fracture of left patella, subsequent encounter for closed fracture with delayed healing: Secondary | ICD-10-CM | POA: Diagnosis not present

## 2020-01-19 DIAGNOSIS — Z86718 Personal history of other venous thrombosis and embolism: Secondary | ICD-10-CM | POA: Insufficient documentation

## 2020-01-19 DIAGNOSIS — Z7952 Long term (current) use of systemic steroids: Secondary | ICD-10-CM | POA: Insufficient documentation

## 2020-01-19 HISTORY — PX: APPLICATION OF WOUND VAC: SHX5189

## 2020-01-19 HISTORY — DX: Other complications of anesthesia, initial encounter: T88.59XA

## 2020-01-19 HISTORY — PX: ORIF PATELLA: SHX5033

## 2020-01-19 HISTORY — DX: Sciatica, unspecified side: M54.30

## 2020-01-19 LAB — BASIC METABOLIC PANEL
Anion gap: 9 (ref 5–15)
BUN: 29 mg/dL — ABNORMAL HIGH (ref 8–23)
CO2: 22 mmol/L (ref 22–32)
Calcium: 8.5 mg/dL — ABNORMAL LOW (ref 8.9–10.3)
Chloride: 104 mmol/L (ref 98–111)
Creatinine, Ser: 1.29 mg/dL — ABNORMAL HIGH (ref 0.61–1.24)
GFR calc Af Amer: 58 mL/min — ABNORMAL LOW (ref >60)
GFR calc non Af Amer: 50 mL/min — ABNORMAL LOW (ref >60)
Glucose, Bld: 125 mg/dL — ABNORMAL HIGH (ref 70–99)
Potassium: 4.3 mmol/L (ref 3.5–5.1)
Sodium: 135 mmol/L (ref 135–145)

## 2020-01-19 LAB — POCT I-STAT, CHEM 8
BUN: 29 mg/dL — ABNORMAL HIGH (ref 8–23)
Calcium, Ion: 1.12 mmol/L — ABNORMAL LOW (ref 1.15–1.40)
Chloride: 104 mmol/L (ref 98–111)
Creatinine, Ser: 1.2 mg/dL (ref 0.61–1.24)
Glucose, Bld: 127 mg/dL — ABNORMAL HIGH (ref 70–99)
HCT: 38 % — ABNORMAL LOW (ref 39.0–52.0)
Hemoglobin: 12.9 g/dL — ABNORMAL LOW (ref 13.0–17.0)
Potassium: 4.3 mmol/L (ref 3.5–5.1)
Sodium: 138 mmol/L (ref 135–145)
TCO2: 22 mmol/L (ref 22–32)

## 2020-01-19 LAB — CBC
HCT: 40.1 % (ref 39.0–52.0)
Hemoglobin: 12.4 g/dL — ABNORMAL LOW (ref 13.0–17.0)
MCH: 28.6 pg (ref 26.0–34.0)
MCHC: 30.9 g/dL (ref 30.0–36.0)
MCV: 92.4 fL (ref 80.0–100.0)
Platelets: 353 10*3/uL (ref 150–400)
RBC: 4.34 MIL/uL (ref 4.22–5.81)
RDW: 15 % (ref 11.5–15.5)
WBC: 10.3 10*3/uL (ref 4.0–10.5)
nRBC: 0 % (ref 0.0–0.2)

## 2020-01-19 SURGERY — OPEN REDUCTION INTERNAL FIXATION (ORIF) PATELLA
Anesthesia: Regional | Site: Knee | Laterality: Left

## 2020-01-19 MED ORDER — LACTATED RINGERS IV SOLN: INTRAVENOUS | Status: AC

## 2020-01-19 MED ORDER — AMLODIPINE BESYLATE 10 MG PO TABS
10.0000 mg | ORAL_TABLET | Freq: Every day | ORAL | Status: DC
  Administered 2020-01-20: 10 mg via ORAL
  Filled 2020-01-19: qty 1

## 2020-01-19 MED ORDER — DEXAMETHASONE SODIUM PHOSPHATE 10 MG/ML IJ SOLN
INTRAMUSCULAR | Status: AC
  Filled 2020-01-19: qty 1

## 2020-01-19 MED ORDER — ONDANSETRON HCL 4 MG/2ML IJ SOLN: 4.0000 mg | Freq: Once | INTRAMUSCULAR | Status: DC | PRN

## 2020-01-19 MED ORDER — 0.9 % SODIUM CHLORIDE (POUR BTL) OPTIME
TOPICAL | Status: DC | PRN
  Administered 2020-01-19 (×5): 1000 mL

## 2020-01-19 MED ORDER — BUPIVACAINE HCL (PF) 0.25 % IJ SOLN
INTRAMUSCULAR | Status: AC
  Filled 2020-01-19: qty 10

## 2020-01-19 MED ORDER — CEFAZOLIN SODIUM-DEXTROSE 2-4 GM/100ML-% IV SOLN
2.0000 g | INTRAVENOUS | Status: AC
  Administered 2020-01-19: 2 g via INTRAVENOUS
  Filled 2020-01-19: qty 100

## 2020-01-19 MED ORDER — ONDANSETRON HCL 4 MG PO TABS: 4.0000 mg | ORAL_TABLET | Freq: Four times a day (QID) | ORAL | Status: DC | PRN

## 2020-01-19 MED ORDER — METHOCARBAMOL 500 MG PO TABS
500.0000 mg | ORAL_TABLET | Freq: Four times a day (QID) | ORAL | Status: DC | PRN
  Administered 2020-01-19: 500 mg via ORAL
  Filled 2020-01-19: qty 1

## 2020-01-19 MED ORDER — CLONIDINE HCL (ANALGESIA) 100 MCG/ML EP SOLN
EPIDURAL | Status: AC
  Filled 2020-01-19: qty 10

## 2020-01-19 MED ORDER — HYDROMORPHONE HCL 1 MG/ML IJ SOLN
0.2500 mg | INTRAMUSCULAR | Status: DC | PRN
  Administered 2020-01-19: 0.5 mg via INTRAVENOUS

## 2020-01-19 MED ORDER — METHOCARBAMOL 1000 MG/10ML IJ SOLN
500.0000 mg | Freq: Four times a day (QID) | INTRAVENOUS | Status: DC | PRN
  Filled 2020-01-19: qty 5

## 2020-01-19 MED ORDER — ACETAMINOPHEN 500 MG PO TABS
1000.0000 mg | ORAL_TABLET | Freq: Four times a day (QID) | ORAL | Status: AC
  Administered 2020-01-19 (×2): 1000 mg via ORAL
  Filled 2020-01-19 (×2): qty 2

## 2020-01-19 MED ORDER — TRANEXAMIC ACID-NACL 1000-0.7 MG/100ML-% IV SOLN
INTRAVENOUS | Status: AC
  Filled 2020-01-19: qty 100

## 2020-01-19 MED ORDER — LIDOCAINE 2% (20 MG/ML) 5 ML SYRINGE
INTRAMUSCULAR | Status: AC
  Filled 2020-01-19: qty 5

## 2020-01-19 MED ORDER — CHLORHEXIDINE GLUCONATE 0.12 % MT SOLN
15.0000 mL | Freq: Once | OROMUCOSAL | Status: AC
  Administered 2020-01-19: 15 mL via OROMUCOSAL
  Filled 2020-01-19: qty 15

## 2020-01-19 MED ORDER — ONDANSETRON HCL 4 MG/2ML IJ SOLN
INTRAMUSCULAR | Status: AC
  Filled 2020-01-19: qty 2

## 2020-01-19 MED ORDER — METOCLOPRAMIDE HCL 5 MG/ML IJ SOLN: 5.0000 mg | Freq: Three times a day (TID) | INTRAMUSCULAR | Status: DC | PRN

## 2020-01-19 MED ORDER — MIDAZOLAM HCL 2 MG/2ML IJ SOLN
INTRAMUSCULAR | Status: AC
  Filled 2020-01-19: qty 2

## 2020-01-19 MED ORDER — FENTANYL CITRATE (PF) 100 MCG/2ML IJ SOLN
INTRAMUSCULAR | Status: DC | PRN
  Administered 2020-01-19 (×2): 50 ug via INTRAVENOUS
  Administered 2020-01-19 (×2): 25 ug via INTRAVENOUS
  Administered 2020-01-19: 100 ug via INTRAVENOUS

## 2020-01-19 MED ORDER — CEFAZOLIN SODIUM-DEXTROSE 2-4 GM/100ML-% IV SOLN
2.0000 g | Freq: Three times a day (TID) | INTRAVENOUS | Status: AC
  Administered 2020-01-19 – 2020-01-20 (×3): 2 g via INTRAVENOUS
  Filled 2020-01-19 (×3): qty 100

## 2020-01-19 MED ORDER — LIDOCAINE 2% (20 MG/ML) 5 ML SYRINGE
INTRAMUSCULAR | Status: DC | PRN
  Administered 2020-01-19: 80 mg via INTRAVENOUS

## 2020-01-19 MED ORDER — VANCOMYCIN HCL 1000 MG IV SOLR
INTRAVENOUS | Status: AC
  Filled 2020-01-19: qty 1000

## 2020-01-19 MED ORDER — MORPHINE SULFATE (PF) 4 MG/ML IV SOLN
INTRAVENOUS | Status: AC
  Filled 2020-01-19: qty 2

## 2020-01-19 MED ORDER — CARVEDILOL 25 MG PO TABS
25.0000 mg | ORAL_TABLET | Freq: Two times a day (BID) | ORAL | Status: DC
  Administered 2020-01-19 – 2020-01-20 (×2): 25 mg via ORAL
  Filled 2020-01-19 (×2): qty 1

## 2020-01-19 MED ORDER — ONDANSETRON HCL 4 MG/2ML IJ SOLN
INTRAMUSCULAR | Status: DC | PRN
  Administered 2020-01-19: 4 mg via INTRAVENOUS

## 2020-01-19 MED ORDER — POVIDONE-IODINE 7.5 % EX SOLN: Freq: Once | CUTANEOUS | Status: DC

## 2020-01-19 MED ORDER — HYDRALAZINE HCL 50 MG PO TABS
50.0000 mg | ORAL_TABLET | Freq: Two times a day (BID) | ORAL | Status: DC
  Administered 2020-01-19 – 2020-01-20 (×2): 50 mg via ORAL
  Filled 2020-01-19 (×2): qty 1

## 2020-01-19 MED ORDER — MORPHINE SULFATE (PF) 4 MG/ML IV SOLN: INTRAVENOUS | Status: DC | PRN

## 2020-01-19 MED ORDER — ONDANSETRON HCL 4 MG/2ML IJ SOLN: 4.0000 mg | Freq: Four times a day (QID) | INTRAMUSCULAR | Status: DC | PRN

## 2020-01-19 MED ORDER — FENTANYL CITRATE (PF) 100 MCG/2ML IJ SOLN
INTRAMUSCULAR | Status: AC
  Filled 2020-01-19: qty 2

## 2020-01-19 MED ORDER — ROPIVACAINE HCL 5 MG/ML IJ SOLN
INTRAMUSCULAR | Status: DC | PRN
Start: 2020-01-19 — End: 2020-01-19
  Administered 2020-01-19: 30 mL via PERINEURAL

## 2020-01-19 MED ORDER — BUPIVACAINE HCL (PF) 0.25 % IJ SOLN
INTRAMUSCULAR | Status: AC
  Filled 2020-01-19: qty 20

## 2020-01-19 MED ORDER — PROPOFOL 10 MG/ML IV BOLUS
INTRAVENOUS | Status: DC | PRN
  Administered 2020-01-19: 120 mg via INTRAVENOUS

## 2020-01-19 MED ORDER — TRANEXAMIC ACID-NACL 1000-0.7 MG/100ML-% IV SOLN
INTRAVENOUS | Status: DC | PRN
Start: 2020-01-19 — End: 2020-01-19
  Administered 2020-01-19: 1000 mg via INTRAVENOUS

## 2020-01-19 MED ORDER — BUPIVACAINE HCL (PF) 0.25 % IJ SOLN: INTRAMUSCULAR | Status: DC | PRN

## 2020-01-19 MED ORDER — SPIRONOLACTONE 25 MG PO TABS
25.0000 mg | ORAL_TABLET | Freq: Every day | ORAL | Status: DC
  Administered 2020-01-20: 25 mg via ORAL
  Filled 2020-01-19: qty 1

## 2020-01-19 MED ORDER — PHENYLEPHRINE 40 MCG/ML (10ML) SYRINGE FOR IV PUSH (FOR BLOOD PRESSURE SUPPORT)
PREFILLED_SYRINGE | INTRAVENOUS | Status: AC
  Filled 2020-01-19: qty 10

## 2020-01-19 MED ORDER — FENTANYL CITRATE (PF) 250 MCG/5ML IJ SOLN
INTRAMUSCULAR | Status: AC
  Filled 2020-01-19: qty 5

## 2020-01-19 MED ORDER — ASPIRIN 81 MG PO CHEW
81.0000 mg | CHEWABLE_TABLET | Freq: Two times a day (BID) | ORAL | Status: DC
  Administered 2020-01-19 – 2020-01-20 (×2): 81 mg via ORAL
  Filled 2020-01-19 (×2): qty 1

## 2020-01-19 MED ORDER — TRANEXAMIC ACID 1000 MG/10ML IV SOLN
INTRAVENOUS | Status: DC | PRN
  Administered 2020-01-19: 2000 mg via TOPICAL

## 2020-01-19 MED ORDER — MEPERIDINE HCL 25 MG/ML IJ SOLN: 6.2500 mg | INTRAMUSCULAR | Status: DC | PRN

## 2020-01-19 MED ORDER — TAMSULOSIN HCL 0.4 MG PO CAPS
0.4000 mg | ORAL_CAPSULE | Freq: Every day | ORAL | Status: DC
  Administered 2020-01-20: 0.4 mg via ORAL
  Filled 2020-01-19: qty 1

## 2020-01-19 MED ORDER — PHENYLEPHRINE HCL (PRESSORS) 10 MG/ML IV SOLN
INTRAVENOUS | Status: DC | PRN
  Administered 2020-01-19: 80 ug via INTRAVENOUS

## 2020-01-19 MED ORDER — FENTANYL CITRATE (PF) 100 MCG/2ML IJ SOLN
25.0000 ug | INTRAMUSCULAR | Status: DC | PRN
  Administered 2020-01-19 (×3): 50 ug via INTRAVENOUS

## 2020-01-19 MED ORDER — PROPOFOL 10 MG/ML IV BOLUS
INTRAVENOUS | Status: AC
  Filled 2020-01-19: qty 20

## 2020-01-19 MED ORDER — OXYCODONE HCL 5 MG PO TABS
5.0000 mg | ORAL_TABLET | ORAL | Status: DC | PRN
  Administered 2020-01-19 – 2020-01-20 (×5): 10 mg via ORAL
  Filled 2020-01-19 (×5): qty 2

## 2020-01-19 MED ORDER — CLONIDINE HCL (ANALGESIA) 100 MCG/ML EP SOLN: EPIDURAL | Status: DC | PRN

## 2020-01-19 MED ORDER — DOCUSATE SODIUM 100 MG PO CAPS
100.0000 mg | ORAL_CAPSULE | Freq: Two times a day (BID) | ORAL | Status: DC
  Administered 2020-01-19 – 2020-01-20 (×2): 100 mg via ORAL
  Filled 2020-01-19 (×2): qty 1

## 2020-01-19 MED ORDER — DEXAMETHASONE SODIUM PHOSPHATE 4 MG/ML IJ SOLN
INTRAMUSCULAR | Status: DC | PRN
  Administered 2020-01-19: 10 mg via INTRAVENOUS

## 2020-01-19 MED ORDER — METOCLOPRAMIDE HCL 5 MG PO TABS: 5.0000 mg | ORAL_TABLET | Freq: Three times a day (TID) | ORAL | Status: DC | PRN

## 2020-01-19 MED ORDER — HYDROMORPHONE HCL 1 MG/ML IJ SOLN
INTRAMUSCULAR | Status: AC
  Filled 2020-01-19: qty 1

## 2020-01-19 MED ORDER — HYDROMORPHONE HCL 1 MG/ML IJ SOLN
0.5000 mg | INTRAMUSCULAR | Status: DC | PRN
  Administered 2020-01-19: 0.5 mg via INTRAVENOUS
  Filled 2020-01-19 (×2): qty 1

## 2020-01-19 MED ORDER — TRANEXAMIC ACID 1000 MG/10ML IV SOLN
2000.0000 mg | Freq: Once | INTRAVENOUS | Status: DC
  Filled 2020-01-19: qty 20

## 2020-01-19 MED ORDER — LACTATED RINGERS IV SOLN
INTRAVENOUS | Status: DC | PRN
Start: 2020-01-19 — End: 2020-01-19

## 2020-01-19 MED ORDER — IRRISEPT - 450ML BOTTLE WITH 0.05% CHG IN STERILE WATER, USP 99.95% OPTIME
TOPICAL | Status: DC | PRN
  Administered 2020-01-19: 450 mL

## 2020-01-19 MED ORDER — POVIDONE-IODINE 10 % EX SWAB: 2.0000 "application " | Freq: Once | CUTANEOUS | Status: DC

## 2020-01-19 MED ORDER — ORAL CARE MOUTH RINSE: 15.0000 mL | Freq: Once | OROMUCOSAL | Status: AC

## 2020-01-19 MED ORDER — VANCOMYCIN HCL 1000 MG IV SOLR
INTRAVENOUS | Status: DC | PRN
  Administered 2020-01-19: 1000 mg via TOPICAL

## 2020-01-19 SURGICAL SUPPLY — 75 items
ANCHOR SUT BIO SW 4.75X19.1 (Anchor) ×3 IMPLANT
BANDAGE ESMARK 6X9 LF (GAUZE/BANDAGES/DRESSINGS) ×1 IMPLANT
BIT DRILL 4.8MMDIAX5IN DISPOSE (BIT) ×1 IMPLANT
BLADE CLIPPER SURG (BLADE) IMPLANT
BNDG COHESIVE 4X5 TAN STRL (GAUZE/BANDAGES/DRESSINGS) ×3 IMPLANT
BNDG ELASTIC 4X5.8 VLCR STR LF (GAUZE/BANDAGES/DRESSINGS) ×3 IMPLANT
BNDG ELASTIC 6X10 VLCR STRL LF (GAUZE/BANDAGES/DRESSINGS) ×3 IMPLANT
BNDG ELASTIC 6X5.8 VLCR STR LF (GAUZE/BANDAGES/DRESSINGS) ×3 IMPLANT
BNDG ESMARK 6X9 LF (GAUZE/BANDAGES/DRESSINGS) ×3
CANISTER WOUND CARE 500ML ATS (WOUND CARE) ×3 IMPLANT
COVER SURGICAL LIGHT HANDLE (MISCELLANEOUS) ×3 IMPLANT
CUFF TOURN SGL QUICK 34 (TOURNIQUET CUFF) ×3
CUFF TRNQT CYL 34X4.125X (TOURNIQUET CUFF) ×1 IMPLANT
DECANTER SPIKE VIAL GLASS SM (MISCELLANEOUS) ×9 IMPLANT
DRAPE C-ARM 42X72 X-RAY (DRAPES) ×3 IMPLANT
DRAPE INCISE IOBAN 66X45 STRL (DRAPES) ×3 IMPLANT
DRILL BIT 4.8MMDIAX5IN DISPOSE (BIT) ×3
DRSG ADAPTIC 3X8 NADH LF (GAUZE/BANDAGES/DRESSINGS) ×3 IMPLANT
DRSG EMULSION OIL 3X3 NADH (GAUZE/BANDAGES/DRESSINGS) ×3 IMPLANT
DRSG PAD ABDOMINAL 8X10 ST (GAUZE/BANDAGES/DRESSINGS) ×3 IMPLANT
ELECT REM PT RETURN 9FT ADLT (ELECTROSURGICAL) ×3
ELECTRODE REM PT RTRN 9FT ADLT (ELECTROSURGICAL) ×1 IMPLANT
GAUZE SPONGE 4X4 12PLY STRL LF (GAUZE/BANDAGES/DRESSINGS) ×3 IMPLANT
GLOVE BIO SURGEON STRL SZ8 (GLOVE) ×6 IMPLANT
GLOVE BIOGEL PI IND STRL 8 (GLOVE) ×1 IMPLANT
GLOVE BIOGEL PI INDICATOR 8 (GLOVE) ×2
GLOVE ECLIPSE 8.0 STRL XLNG CF (GLOVE) ×3 IMPLANT
GOWN STRL REUS W/ TWL LRG LVL3 (GOWN DISPOSABLE) IMPLANT
GOWN STRL REUS W/ TWL XL LVL3 (GOWN DISPOSABLE) ×2 IMPLANT
GOWN STRL REUS W/TWL LRG LVL3 (GOWN DISPOSABLE)
GOWN STRL REUS W/TWL XL LVL3 (GOWN DISPOSABLE) ×6
IMMOBILIZER KNEE 22 UNIV (SOFTGOODS) ×3 IMPLANT
JET LAVAGE IRRISEPT WOUND (IRRIGATION / IRRIGATOR) ×3
KIT BASIN OR (CUSTOM PROCEDURE TRAY) ×3 IMPLANT
KIT BIO-TENODESIS 3X8 DISP (MISCELLANEOUS) ×3
KIT DRSG PREVENA PLUS 7DAY 125 (MISCELLANEOUS) ×3 IMPLANT
KIT INSRT BABSR STRL DISP BTN (MISCELLANEOUS) ×1 IMPLANT
KIT PREVENA INCISION MGT20CM45 (CANNISTER) ×3 IMPLANT
KIT TURNOVER KIT B (KITS) ×3 IMPLANT
LAVAGE JET IRRISEPT WOUND (IRRIGATION / IRRIGATOR) ×1 IMPLANT
MANIFOLD NEPTUNE II (INSTRUMENTS) ×3 IMPLANT
NEEDLE 22X1 1/2 (OR ONLY) (NEEDLE) IMPLANT
NS IRRIG 1000ML POUR BTL (IV SOLUTION) ×3 IMPLANT
PACK ORTHO EXTREMITY (CUSTOM PROCEDURE TRAY) ×3 IMPLANT
PAD ABD 8X10 STRL (GAUZE/BANDAGES/DRESSINGS) ×3 IMPLANT
PAD ARMBOARD 7.5X6 YLW CONV (MISCELLANEOUS) ×6 IMPLANT
PAD CAST 4YDX4 CTTN HI CHSV (CAST SUPPLIES) ×1 IMPLANT
PADDING CAST COTTON 4X4 STRL (CAST SUPPLIES) ×3
PADDING CAST COTTON 6X4 STRL (CAST SUPPLIES) ×3 IMPLANT
PASSER SUT SWANSON 36MM LOOP (INSTRUMENTS) ×3 IMPLANT
PUTTY DBM ALLOSYNC PURE 5CC (Putty) ×3 IMPLANT
RETRIEVER SUT HEWSON (MISCELLANEOUS) ×3 IMPLANT
SPONGE LAP 18X18 X RAY DECT (DISPOSABLE) ×3 IMPLANT
SPONGE LAP 4X18 RFD (DISPOSABLE) ×3 IMPLANT
STAPLER VISISTAT 35W (STAPLE) ×3 IMPLANT
STOCKINETTE IMPERVIOUS 9X36 MD (GAUZE/BANDAGES/DRESSINGS) ×3 IMPLANT
SUCTION FRAZIER HANDLE 10FR (MISCELLANEOUS) ×3
SUCTION TUBE FRAZIER 10FR DISP (MISCELLANEOUS) ×1 IMPLANT
SUT ETHILON 3 0 PS 1 (SUTURE) ×12 IMPLANT
SUT FIBERWIRE #2 38 REV NDL BL (SUTURE)
SUT MNCRL AB 3-0 PS2 27 (SUTURE) ×6 IMPLANT
SUT VIC AB 0 CT1 27 (SUTURE) ×24
SUT VIC AB 0 CT1 27XBRD ANBCTR (SUTURE) ×8 IMPLANT
SUT VIC AB 1 CT1 36 (SUTURE) ×6 IMPLANT
SUT VIC AB 2-0 CT1 27 (SUTURE) ×9
SUT VIC AB 2-0 CT1 TAPERPNT 27 (SUTURE) ×3 IMPLANT
SUTURE FIBERWR#2 38 REV NDL BL (SUTURE) IMPLANT
SYR CONTROL 10ML LL (SYRINGE) ×3 IMPLANT
TOWEL GREEN STERILE (TOWEL DISPOSABLE) ×3 IMPLANT
TOWEL GREEN STERILE FF (TOWEL DISPOSABLE) ×3 IMPLANT
TUBE CONNECTING 12'X1/4 (SUCTIONS) ×1
TUBE CONNECTING 12X1/4 (SUCTIONS) ×2 IMPLANT
UNDERPAD 30X36 HEAVY ABSORB (UNDERPADS AND DIAPERS) ×3 IMPLANT
WATER STERILE IRR 1000ML POUR (IV SOLUTION) ×3 IMPLANT
YANKAUER SUCT BULB TIP NO VENT (SUCTIONS) ×3 IMPLANT

## 2020-01-19 NOTE — H&P (Signed)
[name] is an 86 y.o. male.   Chief Complaint: Left knee pain HPI: [name] is an 86 year old patient with left knee pain.  Had total knee replacement about 8 months ago and was doing very well.  Had several falls and sustained displaced inferior pole patella fracture.  Now presents for operative management in order to prevent further falls.  The fracture is displaced.  Past Medical History:  Diagnosis Date  . ACTINIC KERATOSIS, HEAD 03/23/2008  . ALLERGIC RHINITIS 03/27/2007  . Arthritis   . BENIGN PROSTATIC HYPERTROPHY, WITH URINARY OBSTRUCTION 10/16/2007  . CAD 08/18/2007   "patient denies any issues with heart, does not see cardiologist"  . Carpal tunnel syndrome, bilateral 09/04/2016  . CARPAL TUNNEL SYNDROME, LEFT 04/13/2008  . Chronic back pain   . Chronic neck pain   . Complication of anesthesia    left lower jaw teeth have been injured in the past and he has lost those, now has 2 loose front teeth due to a mouth injury on 01/02/20  . DVT (deep venous thrombosis) ([place])   . ERECTILE DYSFUNCTION, ORGANIC 02/20/2010  . Gout, unspecified 02/20/2010  . Headache(784.0)    migraines hx of  . History of melanoma   . HYPERLIPIDEMIA, WITH HIGH HDL 10/18/2008  . HYPERTENSION 05/09/2007   sees Dr. Benay Pillow  . KNEE PAIN 10/10/2009  . LOW BACK PAIN 03/27/2007  . NEOPLASM, MALIGNANT, SKIN, TRUNK 05/09/2007   squamous cell on scalp, sideburn and ear (right)  . Patellar tendinitis 10/10/2009  . Sciatic nerve pain    on Oxycodone as needed  . SKIN CANCER, HX OF 03/27/2007  . UNS ADVRS EFF UNS RX MEDICINAL&BIOLOGICAL SBSTNC 08/18/2007  . Urinary urgency     Past Surgical History:  Procedure Laterality Date  . APPENDECTOMY  1969  . BACK SURGERY    . BLEPHAROPLASTY    . CARPAL TUNNEL RELEASE    . CHOLECYSTECTOMY    . ESOPHAGOGASTRODUODENOSCOPY (EGD) WITH PROPOFOL N/A 04/26/2015   Procedure: ESOPHAGOGASTRODUODENOSCOPY (EGD) WITH PROPOFOL;  Surgeon: Inda Castle, MD;  Location: [facility];   Service: Endoscopy;  Laterality: N/A;  . EYE SURGERY     bilateral cataracts removed and vitrectomies  . JOINT REPLACEMENT Right    knee  . KNEE ARTHROSCOPY    . LAMINECTOMY    . LATERAL FUSION LUMBAR SPINE, TRANSVERSE    . LUMBAR LAMINECTOMY/DECOMPRESSION MICRODISCECTOMY Left 11/03/2012   Procedure: LUMBAR LAMINECTOMY/DECOMPRESSION MICRODISCECTOMY 1 LEVEL;  Surgeon: Hosie Spangle, MD;  Location: [facility];  Service: Neurosurgery;  Laterality: Left;  LEFT L5S1 laminotomy foraminotomy and possible microdiskectomy  . MELANOMA EXCISION    . NASAL SINUS SURGERY    . POSTERIOR CERVICAL FUSION/FORAMINOTOMY Right 10/01/2012   Procedure: POSTERIOR CERVICAL FUSION/FORAMINOTOMY LEVEL 1;  Surgeon: Hosie Spangle, MD;  Location: [facility];  Service: Neurosurgery;  Laterality: Right;  Right Cervical seven thoracic one Cervical laminectomy/foraminotomy with posterior cervical arthrodesis   . POSTERIOR LUMBAR FUSION N/A 05/23/2013   Procedure: exploration of lumbar wound, explantation of left interbody implant.;  Surgeon: Hosie Spangle, MD;  Location: [facility];  Service: Neurosurgery;  Laterality: N/A;  . RECTAL SURGERY     Fissure  . TOTAL KNEE ARTHROPLASTY Left 05/07/2019   Procedure: LEFT TOTAL KNEE ARTHROPLASTY;  Surgeon: [name], MD;  Location: [facility];  Service: Orthopedics;  Laterality: Left;  Marland Kitchen VITRECTOMY     right and left 2013    Family History  Problem Relation Age of Onset  .  Dementia Mother   . Anemia Father   . Heart disease Father   . Stroke Sister        ICH  . Lung disease Sister    Social History:  reports that he quit smoking about 38 years ago. He has a 33.00 pack-year smoking history. He has never used smokeless tobacco. He reports current alcohol use of about 3.0 standard drinks of alcohol per week. He reports that he does not use drugs.  Allergies:  Allergies  Allergen Reactions  . Gabapentin     Lower extremity edema  . Lyrica [Pregabalin]  Swelling    Medications Prior to Admission  Medication Sig Dispense Refill  . amLODipine (NORVASC) 10 MG tablet TAKE 1 TABLET BY MOUTH EVERY DAY 90 tablet 1  . carvedilol (COREG) 25 MG tablet TAKE 1 TABLET BY MOUTH 2 TIMES DAILY WITH A MEAL (Patient taking differently: Take 25 mg by mouth 2 (two) times daily with a meal. ) 180 tablet 1  . [facility] MAGNESIUM OXIDE 250 MG TABS TAKE 1 TABLET BY MOUTH EVERY DAY (Patient taking differently: Take 250 mg by mouth daily as needed (constipation). ) 90 tablet 0  . doxycycline (VIBRAMYCIN) 100 MG capsule Take 100 mg by mouth 2 (two) times daily.    . fluticasone (FLONASE) 50 MCG/ACT nasal spray Place 1 spray into both nostrils daily.     . hydrALAZINE (APRESOLINE) 50 MG tablet TAKE 1 TABLET BY MOUTH TWICE A DAY (Patient taking differently: Take 50 mg by mouth in the morning and at bedtime. ) 180 tablet 1  . Oxycodone HCl 10 MG TABS Take 1 tablet (10 mg total) by mouth every 4 (four) hours as needed (pain score 4-6). (Patient taking differently: Take 10 mg by mouth 4 (four) times daily as needed (pain score 4-6). ) 42 tablet 0  . predniSONE (DELTASONE) 10 MG tablet TAKE 1-4 TABLETS (10-40 MG TOTAL) BY MOUTH DAILY WITH BREAKFAST. (Patient taking differently: Take 20 mg by mouth daily with breakfast. ) 90 tablet 0  . spironolactone (ALDACTONE) 25 MG tablet Take 1 tablet (25 mg total) by mouth daily. 90 tablet 1  . tamsulosin (FLOMAX) 0.4 MG CAPS capsule TAKE 1 CAPSULE BY MOUTH EVERY DAY 90 capsule 1  . [facility]S VITAMIN B12 1000 MCG tablet TAKE 1 TABLET (1,000 MCG TOTAL) BY MOUTH DAILY. (Patient not taking: No sig reported) 90 tablet 0  . finasteride (PROSCAR) 5 MG tablet TAKE 1 TABLET BY MOUTH EVERY DAY (Patient not taking: No sig reported) 90 tablet 1  . hydrocortisone 2.5 % cream Apply 1 application topically daily as needed (itching).     . methocarbamol (ROBAXIN) 750 MG tablet Take 1 tablet (750 mg total) by mouth every 8 (eight) hours as needed for muscle spasms.  (Patient not taking: Reported on 01/08/2020) 90 tablet 3  . triamcinolone cream (KENALOG) 0.5 % APPLY 1 APPLICATION TOPICALLY 2 (TWO) TIMES DAILY AS NEEDED (DRY SKIN, ITCHING). 90 g 0    Results for orders placed or performed during the hospital encounter of 01/19/20 (from the past 48 hour(s))  Basic metabolic panel     Status: Abnormal   Collection Time: 01/19/20  6:59 AM  Result Value Ref Range   Sodium 135 135 - 145 mmol/L   Potassium 4.3 3.5 - 5.1 mmol/L   Chloride 104 98 - 111 mmol/L   CO2 22 22 - 32 mmol/L   Glucose, Bld 125 (H) 70 - 99 mg/dL    Comment: Glucose reference range  applies only to samples taken after fasting for at least 8 hours.   BUN 29 (H) 8 - 23 mg/dL   Creatinine, Ser 1.29 (H) 0.61 - 1.24 mg/dL   Calcium 8.5 (L) 8.9 - 10.3 mg/dL   GFR calc non Af Amer 50 (L) >60 mL/min   GFR calc Af Amer 58 (L) >60 mL/min   Anion gap 9 5 - 15    Comment: Performed at [facility] [address], [place], [place] [address]   No results found.  Review of Systems  Musculoskeletal: Positive for arthralgias.  All other systems reviewed and are negative.   Blood pressure 119/63, pulse 83, temperature 98.4 F (36.9 C), temperature source Oral, resp. rate 18, height 6' (1.829 m), weight 95.3 kg, SpO2 95 %. Physical Exam  Constitutional: He appears well-developed.  HENT:  Head: Normocephalic.  Eyes: Pupils are equal, round, and reactive to light.  Cardiovascular: Normal rate.  Respiratory: Effort normal.  Musculoskeletal:     Cervical back: Normal range of motion.  Neurological: He is alert.  Skin: Skin is warm.  Psychiatric: He has a normal mood and affect.  Examination of the left knee demonstrates diminished extension strength.  Knee effusion is present.  Collaterals are stable.  Pedal pulses palpable.  No calf tenderness.  Patient is able to walk but he is very guarded.  Assessment/Plan Impression is left knee inferior pole patella fracture status post multiple  falls after total knee replacement.  Plan is open reduction internal fixation of the patella fracture.  We may need to augment this with hamstring autograft.  Risk and benefits of the procedure discussed with the patient including not limited to infection nerve vessel damage incomplete healing as well as incomplete restoration of extensor mechanism.  Patient understands the risk and benefits.  All questions answered.  Plan to use vancomycin powder intra-articular as well as on the surface.  Anderson Malta, MD 01/19/2020, 7:33 AM

## 2020-01-19 NOTE — Evaluation (Signed)
Physical Therapy Evaluation Patient Details Name: [name] MRN: [id number] DOB: September 13, 1931 Today's Date: 01/19/2020   History of Present Illness  86 yo male s/p L ORIF patella with autografting on 01/19/20. PMH includes carpal tunnel, CAD, DVT, headaches, HTN, HLD, lumbar fusion, L TKR 04/2019.  Clinical Impression   Pt presents with severe L knee pain, difficulty performing mobility tasks, impaired standing balance, inability to progress to gait, and decreased activity tolerance. Pt to benefit from acute PT to address deficits. Pt required min assist for bed mobility and transfer to stand, pt unable to take steps this day due to severe pain and required very high bed height to stand. 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 PT recommending SNF vs HHPT to address mobility deficits post-acutely. PT to progress mobility as tolerated, and will continue to follow acutely.      Follow Up Recommendations SNF;Home health PT;Supervision/Assistance - 24 hour    Equipment Recommendations  Other (comment)(TBD)    Recommendations for Other Services       Precautions / Restrictions Precautions Precautions: Fall Required Braces or Orthoses: Knee Immobilizer - Left Knee Immobilizer - Left: On at all times Restrictions Weight Bearing Restrictions: No Other Position/Activity Restrictions: WBAT      Mobility  Bed Mobility Overal bed mobility: Needs Assistance Bed Mobility: Supine to Sit;Sit to Supine     Supine to sit: Min assist;HOB elevated Sit to supine: Min assist;HOB elevated   General bed mobility comments: Min assist for LLE lifting and translation into and out of bed, verbal cuing for sequencing, pt with use of bedrails and increased HOB elevation to perform.  Transfers Overall transfer level: Needs assistance Equipment used: Rolling walker (2 wheeled) Transfers: Sit to/from Stand Sit to Stand: Min  assist;From elevated surface         General transfer comment: min assist for power up, steadying. VERY elevated bed height and pt walking LLE within BOS due to being in [place]. Pt stood EOB x1 minute, unable to take steps.  Ambulation/Gait             General Gait Details: unable this day  Stairs            Wheelchair Mobility    Modified Rankin (Stroke Patients Only)       Balance Overall balance assessment: Needs assistance;History of Falls Sitting-balance support: No upper extremity supported;Feet supported Sitting balance-Leahy Scale: Fair     Standing balance support: Bilateral upper extremity supported Standing balance-Leahy Scale: Poor Standing balance comment: reliant on external assist                             Pertinent Vitals/Pain Pain Assessment: 0-10 Pain Score: 9  Pain Location: L knee, posteriorly Pain Descriptors / Indicators: Sore;Discomfort;Grimacing Pain Intervention(s): Limited activity within patient's tolerance;Monitored during session;Premedicated before session;Patient requesting pain meds-RN notified    Home Living Family/patient expects to be discharged to:: Private residence Living Arrangements: Spouse/significant other Available Help at Discharge: Family(son and niece live close by) Type of Home: House Home Access: Elevator     Home Layout: One level Home Equipment: Environmental consultant - 2 wheels      Prior Function Level of Independence: Independent         Comments: Pt reports he is the caregiver for his wife, reports no use of AD PTA but states frequent falls  due to L knee buckling     Hand Dominance   Dominant Hand: Right    Extremity/Trunk Assessment   Upper Extremity Assessment Upper Extremity Assessment: Overall WFL for tasks assessed    Lower Extremity Assessment Lower Extremity Assessment: Generalized weakness;LLE deficits/detail LLE Deficits / Details: able to perform ankle pumps LLE: Unable to fully  assess due to immobilization    Cervical / Trunk Assessment Cervical / Trunk Assessment: Normal  Communication   Communication: No difficulties  Cognition Arousal/Alertness: Awake/alert Behavior During Therapy: WFL for tasks assessed/performed Overall Cognitive Status: Within Functional Limits for tasks assessed Area of Impairment: Safety/judgement                         Safety/Judgement: Decreased awareness of deficits;Decreased awareness of safety     General Comments: Pt A&Ox4, lacks insight into deficits and how they will carry over into home function/caring for wife.      General Comments      Exercises     Assessment/Plan    PT Assessment Patient needs continued PT services  PT Problem List Decreased strength;Decreased mobility;Decreased safety awareness;Decreased activity tolerance;Decreased balance;Decreased knowledge of use of DME;Pain;Decreased knowledge of precautions       PT Treatment Interventions DME instruction;Therapeutic activities;Gait training;Therapeutic exercise;Patient/family education;Balance training;Stair training;Functional mobility training    PT Goals (Current goals can be found in the Care Plan section)  Acute Rehab PT Goals Patient Stated Goal: go home as soon as possible PT Goal Formulation: With patient Time For Goal Achievement: 02/02/20 Potential to Achieve Goals: Good    Frequency Min 3X/week   Barriers to discharge        Co-evaluation               AM-PAC PT "6 Clicks" Mobility  Outcome Measure Help needed turning from your back to your side while in a flat bed without using bedrails?: A Little Help needed moving from lying on your back to sitting on the side of a flat bed without using bedrails?: A Lot Help needed moving to and from a bed to a chair (including a wheelchair)?: A Lot Help needed standing up from a chair using your arms (e.g., wheelchair or bedside chair)?: A Lot Help needed to walk in  hospital room?: Total Help needed climbing 3-5 steps with a railing? : Total 6 Click Score: 11    End of Session Equipment Utilized During Treatment: Gait belt;Left knee immobilizer Activity Tolerance: Patient limited by pain Patient left: in bed;with call bell/phone within reach;with bed alarm set;with SCD's reapplied Nurse Communication: Mobility status;Patient requests pain meds;Other (comment)(L wrist IV bleeding) PT Visit Diagnosis: Other abnormalities of gait and mobility (R26.89);Muscle weakness (generalized) (M62.81);Difficulty in walking, not elsewhere classified (R26.2)    Time: 3474-2595 PT Time Calculation (min) (ACUTE ONLY): 29 min   Charges:   PT Evaluation $PT Eval Low Complexity: 1 Low PT Treatments $Therapeutic Activity: 8-22 mins       Braedyn Kauk E, PT Acute Rehabilitation Services Pager [phone]  Office [phone]  Keziyah Kneale D Elonda Husky 01/19/2020, 4:49 PM

## 2020-01-19 NOTE — Anesthesia Postprocedure Evaluation (Signed)
Anesthesia Post Note  Patient: ARKIN IMRAN  Procedure(s) Performed: LEFT OPEN REDUCTION INTERNAL (ORIF) FIXATION PATELLA WITH AUTO GRAFTING USING HAMSTRING REINFORCEMENT (Left Knee) Application Of Wound Vac (Left Knee)     Patient location during evaluation: PACU Anesthesia Type: General Level of consciousness: awake and alert Pain management: pain level controlled Vital Signs Assessment: post-procedure vital signs reviewed and stable Respiratory status: spontaneous breathing, nonlabored ventilation and respiratory function stable Cardiovascular status: blood pressure returned to baseline and stable Postop Assessment: no apparent nausea or vomiting Anesthetic complications: no    Last Vitals:  Vitals:   01/19/20 1114 01/19/20 1129  BP: 133/68 134/74  Pulse: 76 83  Resp: 10 14  Temp:    SpO2: 97% 95%    Last Pain:  Vitals:   01/19/20 1129  TempSrc:   PainSc: 7                  Klayton Monie A.

## 2020-01-19 NOTE — Anesthesia Procedure Notes (Signed)
Anesthesia Regional Block: Femoral nerve block   Pre-Anesthetic Checklist: ,, timeout performed, Correct Patient, Correct Site, Correct Laterality, Correct Procedure, Correct Position, site marked, Risks and benefits discussed,  Surgical consent,  Pre-op evaluation,  At surgeon's request and post-op pain management  Laterality: Left  Prep: chloraprep       Needles:  Injection technique: Single-shot  Needle Type: Echogenic Stimulator Needle     Needle Length: 9cm  Needle Gauge: 21   Needle insertion depth: 5 cm   Additional Needles:   Narrative:  Start time: 01/19/2020 7:15 AM End time: 01/19/2020 7:20 AM Injection made incrementally with aspirations every 5 mL.  Performed by: Personally  Anesthesiologist: Josephine Igo, MD  Additional Notes: Timeout performed. Patient sedated. Relevant anatomy ID'd using [place]. Incremental 2-66ml injection of LA with frequent aspiration. Patient tolerated procedure well.        Left Femoral Nerve Block

## 2020-01-19 NOTE — Brief Op Note (Signed)
   01/19/2020  10:53 AM  PATIENT:  [name]  86 y.o. male  PRE-OPERATIVE DIAGNOSIS:  left patella fracture  POST-OPERATIVE DIAGNOSIS:  left patella fracture  PROCEDURE:  Procedure(s): LEFT OPEN REDUCTION INTERNAL (ORIF) FIXATION PATELLA WITH hamstring  AUTO GRAFTING  REINFORCEMENT Application Of Wound Vac  SURGEON:  Surgeon(s): [name], MD  ASSISTANT: magnant pa  ANESTHESIA:   general  EBL: 75 ml    Total I/O In: 1000 [I.V.:1000] Out: 100 [Blood:100]  BLOOD ADMINISTERED: none  DRAINS: none except incisional wound vac  LOCAL MEDICATIONS USED:  none  SPECIMEN:  No Specimen  COUNTS:  YES  TOURNIQUET:   Total Tourniquet Time Documented: Thigh (Left) - 105 minutes Total: Thigh (Left) - 105 minutes   DICTATION: .Other Dictation: Dictation Number [id number]  PLAN OF CARE: Admit for overnight observation  PATIENT DISPOSITION:  PACU - hemodynamically stable

## 2020-01-19 NOTE — Anesthesia Procedure Notes (Signed)
Procedure Name: LMA Insertion Date/Time: 01/19/2020 7:50 AM Performed by: Trinna Post., CRNA Pre-anesthesia Checklist: Patient identified, Emergency Drugs available, Suction available, Patient being monitored and Timeout performed Patient Re-evaluated:Patient Re-evaluated prior to induction Oxygen Delivery Method: Circle system utilized Preoxygenation: Pre-oxygenation with 100% oxygen Induction Type: IV induction LMA: LMA inserted LMA Size: 4.0 Number of attempts: 1 Placement Confirmation: positive ETCO2 and breath sounds checked- equal and bilateral Tube secured with: Tape Dental Injury: Teeth and Oropharynx as per pre-operative assessment

## 2020-01-19 NOTE — Op Note (Signed)
NAME: [name], RUETH MEDICAL RECORD OV:[id number] ACCOUNT [id number] DATE OF BIRTH:[birth year] FACILITY: [facility] LOCATION: [facility] PHYSICIAN:Naira Standiford Randel Pigg, MD  OPERATIVE REPORT  DATE OF PROCEDURE:  01/19/2020  PREOPERATIVE DIAGNOSIS:  Left knee chronic patellar fracture, status post total knee replacement.  POSTOPERATIVE DIAGNOSIS:  Left knee chronic patellar fracture, status post total knee replacement.  PROCEDURE:  Left knee patellar fracture open reduction internal fixation with bone grafting and autograft hamstring reinforcement.  SURGEON:  [name], MD  ASSISTANT:  Annie Main, PA.  INDICATIONS:  The patient is an 86 year old patient who had a total knee replacement 8 months ago.  He has had 2 falls and presents now for treatment of patellar fracture, which is approximately 32 weeks old.  He presents now after explanation of risks  and benefits.  The patient does care for his disabled wife.  PROCEDURE IN DETAIL:  The patient was brought to the operating room where general anesthetic was induced.  Preoperative antibiotics were administered.  Timeout was called.  Left leg was prescrubbed with alcohol and Betadine, allowed to air dry, prepped  with DuraPrep solution and draped in a sterile manner.  TXA was utilized.  Ioban used to cover the operative field.  The leg was elevated, exsanguinated with the Esmarch wrap.  Tourniquet was inflated.  Incision was made where the prior incision was  anteriorly.  Skin and subcutaneous tissue were sharply divided.  Clear fluid was present, but did not have any cloudiness or infected appearance to it.  The full thickness skin flaps were developed medially and laterally.  The fracture site was  identified.  The patellar implant itself was stable within the remnant patella.  The distal pole fragment was identified.  Mobilization of the patellar tendon was performed with circumferential release of the tissue.  Next, Arthrex suture tape was  placed  in a Kessler grasping fashion with 2 sutures in 4 strands, passing into the tendon and then into the inferior pole of the patella.  This was brought out through the posterior aspect of the inferior pole of the patella.  Next, the semitendinosis  hamstring was harvested after extending the incision slightly distally.  This was done with an open tendon harvester and it was left attached at its attachment site on the tibia.  A suture was placed around that tendon end.  It was then tunneled on the  medial aspect of the patellar tendon and then around the superior aspect of the patella itself through the quad tendon and then down laterally just below Gerdy tubercle.  Thorough irrigation was then performed, both with the IrriSept solution as well as  with TXA topical.  Next, the drill holes were placed and the patella remnant.  The 2 drill holes were placed to avoid pegs.  This was done with the smallest drill bit.  Sutures were then passed, 2 limbs through each drill hole.  Next, a thorough  irrigation was performed and bone graft was placed.  With the leg in full extension, we were able to approximate the bone.  The tendon was then placed into the tibia using a SwiveLock with good fixation achieved.  The sutures were then tied to each  other, which gave a good solid construct.  Next, the tourniquet was released.  Bleeding points encountered were controlled using electrocautery.  Thorough irrigation was performed.  Redundant retinacular tissue was excised to facilitate closure.  The  retinaculum was then closed using #1 Vicryl suture, followed by interrupted inverted 0 Vicryl  suture, 2-0 Vicryl suture and 3-0 nylon.  An incisional wound VAC was placed.  Vancomycin was placed, both within the knee joint itself as well as within the  superficial part of the incision.  The patient tolerated the procedure well without immediate complications.  He was transferred to the recovery room in stable condition.   Luke's assistance was required at all times during the case for retraction, as  well as mobilization of tissues, opening and closing.  His assistance was a medical necessity.    He will be weightbearing as tolerated in the knee immobilizer, but we are not going to allow him to bend that knee for at least 4 weeks.  VN/NUANCE  D:01/19/2020 T:01/19/2020 JOB:[id number]

## 2020-01-19 NOTE — Transfer of Care (Signed)
Immediate Anesthesia Transfer of Care Note  Patient: [name]  Procedure(s) Performed: LEFT OPEN REDUCTION INTERNAL (ORIF) FIXATION PATELLA WITH AUTO GRAFTING USING HAMSTRING REINFORCEMENT (Left Knee) Application Of Wound Vac (Left Knee)  Patient Location: PACU  Anesthesia Type:General and Regional  Level of Consciousness: awake, alert  and oriented  Airway & Oxygen Therapy: Patient Spontanous Breathing and Patient connected to nasal cannula oxygen  Post-op Assessment: Report given to RN and Post -op Vital signs reviewed and stable  Post vital signs: Reviewed and stable  Last Vitals:  Vitals Value Taken Time  BP 147/79 01/19/20 1059  Temp 36.5 C 01/19/20 1059  Pulse 78 01/19/20 1103  Resp 16 01/19/20 1103  SpO2 95 % 01/19/20 1103  Vitals shown include unvalidated device data.  Last Pain:  Vitals:   01/19/20 1059  TempSrc:   PainSc: 9       Patients Stated Pain Goal: 3 (37/34/28 7681)  Complications: No apparent anesthesia complications

## 2020-01-20 DIAGNOSIS — S82002A Unspecified fracture of left patella, initial encounter for closed fracture: Secondary | ICD-10-CM | POA: Diagnosis not present

## 2020-01-20 MED ORDER — ASPIRIN 81 MG PO CHEW: 81.0000 mg | CHEWABLE_TABLET | Freq: Every day | ORAL | 0 refills | Status: DC

## 2020-01-20 MED ORDER — METHOCARBAMOL 500 MG PO TABS: 500.0000 mg | ORAL_TABLET | Freq: Three times a day (TID) | ORAL | 0 refills | Status: DC | PRN

## 2020-01-20 NOTE — Progress Notes (Signed)
Provided discharge education/instructions, all questions and concerns addressed, Pt not in distress, to discharge home with belongings accompanied by son.

## 2020-01-20 NOTE — Progress Notes (Signed)
  Subjective: Patient is an 86 year old male who presents s/p left patella ORIF.  He is postop day 1.  He is doing well and ambulating with knee immobilizer in place.  Denies any complaints of intractable pain.  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   Objective: Vital signs in last 24 hours: Temp:  [97.4 F (36.3 C)-99 F (37.2 C)] 98.4 F (36.9 C) (06/09 0818) Pulse Rate:  [71-98] 98 (06/09 0818) Resp:  [10-17] 16 (06/09 0818) BP: (112-138)/(58-77) 112/76 (06/09 0818) SpO2:  [93 %-98 %] 94 % (06/09 0818)  Intake/Output from previous day: 06/08 0701 - 06/09 0700 In: 1560 [P.O.:360; I.V.:1000; IV Piggyback:200] Out: 2800 [Urine:2700; Blood:100] Intake/Output this shift: Total I/O In: 100 [IV Piggyback:100] Out: 1160 [Urine:1160]  Exam:  Wound VAC in place, suction intact Dorsiflexion/plantarflexion intact Left foot warm and well-perfused  Labs: Recent Labs    01/19/20 0650 01/19/20 0659  HGB 12.9* 12.4*   Recent Labs    01/19/20 0650 01/19/20 0659  WBC  --  10.3  RBC  --  4.34  HCT 38.0* 40.1  PLT  --  353   Recent Labs    01/19/20 0650 01/19/20 0659  NA 138 135  K 4.3 4.3  CL 104 104  CO2  --  22  BUN 29* 29*  CREATININE 1.20 1.29*  GLUCOSE 127* 125*  CALCIUM  --  8.5*   No results for input(s): LABPT, INR in the last 72 hours.  Assessment/Plan: Plan for discharge home today.  Remain weightbearing as tolerated in knee immobilizer.  Follow-up with the office to see Dr. Marlou Sa on 01/25/2020.  Will remove wound VAC at that time.  Will evaluate incision and decide for or against suture removal at that time as well.   [place] Satoria Dunlop 01/20/2020, 12:20 PM

## 2020-01-20 NOTE — Progress Notes (Signed)
Physical Therapy Treatment Patient Details Name: BRYAM TABORDA MRN: [id number] DOB: September 24, 1931 Today's Date: 01/20/2020    History of Present Illness 86 yo male s/p L ORIF patella with autografting on 01/19/20. PMH includes carpal tunnel, CAD, DVT, headaches, HTN, HLD, lumbar fusion, L TKR 04/2019.    PT Comments    Pt supine in bed on arrival.  KI donned but poor;y fitted.  Replace stays on brace for correct placement and fit.  Pt's resting on tubing from wound vac.  Placed foam border on shin to allow for padded surface for tubing to rest. He is progressing well and ready to d/c home from a mobility standpoint.  Will inform supervising PT of need for change in recommendation at this time.  He reports his niece lives close by should he need assistance.     Follow Up Recommendations  Home health PT;Supervision/Assistance - 24 hour     Equipment Recommendations  (Son reports he has  RW at home.)    Recommendations for Other Services       Precautions / Restrictions Precautions Precautions: Fall Required Braces or Orthoses: Knee Immobilizer - Left Knee Immobilizer - Left: On at all times Restrictions Weight Bearing Restrictions: Yes LLE Weight Bearing: Weight bearing as tolerated(in knee immobilizer.)    Mobility  Bed Mobility Overal bed mobility: Needs Assistance Bed Mobility: Supine to Sit     Supine to sit: Supervision     General bed mobility comments: Increased time but able to move to edge of bed without assistance.  Transfers Overall transfer level: Needs assistance Equipment used: Rolling walker (2 wheeled) Transfers: Sit to/from Stand Sit to Stand: Supervision;From elevated surface         General transfer comment: assisted height for better fit.  Pt impulsive to rise to stand but good safety noted.  Cues for advancing LLE forward when sitting to deeper recliner as knee will not bend in brace.  Ambulation/Gait Ambulation/Gait assistance: Min  guard;Supervision Gait Distance (Feet): 250 Feet Assistive device: Rolling walker (2 wheeled) Gait Pattern/deviations: Step-through pattern;Decreased weight shift to left;Decreased step length - right;Trunk flexed     General Gait Details: Cues for upper trunk control, increasing R step length and gt symmetry.  Pt leaving RW to the side when backing to seated surface.  required cues to bring RW with him.   Stairs             Wheelchair Mobility    Modified Rankin (Stroke Patients Only)       Balance Overall balance assessment: Needs assistance;History of Falls Sitting-balance support: No upper extremity supported;Feet supported Sitting balance-Leahy Scale: Fair     Standing balance support: Bilateral upper extremity supported Standing balance-Leahy Scale: Poor Standing balance comment: reliant on external assist                            Cognition Arousal/Alertness: Awake/alert Behavior During Therapy: WFL for tasks assessed/performed Overall Cognitive Status: Within Functional Limits for tasks assessed                                 General Comments: Pt very impulsive with poor safety awareness.  Likely baseline,  son agrees to this behavior.      Exercises      General Comments        Pertinent Vitals/Pain Pain Assessment: 0-10 Pain Score: 3  Pain Location:  L knee Pain Descriptors / Indicators: Sore Pain Intervention(s): Monitored during session    Home Living                      Prior Function            PT Goals (current goals can now be found in the care plan section) Acute Rehab PT Goals Patient Stated Goal: go home as soon as possible Potential to Achieve Goals: Good Progress towards PT goals: Progressing toward goals    Frequency    Min 3X/week      PT Plan Discharge plan needs to be updated    Co-evaluation              AM-PAC PT "6 Clicks" Mobility   Outcome Measure  Help needed  turning from your back to your side while in a flat bed without using bedrails?: A Little Help needed moving from lying on your back to sitting on the side of a flat bed without using bedrails?: A Little Help needed moving to and from a bed to a chair (including a wheelchair)?: A Little Help needed standing up from a chair using your arms (e.g., wheelchair or bedside chair)?: A Little Help needed to walk in hospital room?: A Little Help needed climbing 3-5 steps with a railing? : A Little 6 Click Score: 18    End of Session Equipment Utilized During Treatment: Gait belt;Left knee immobilizer Activity Tolerance: Patient limited by pain Patient left: in bed;with call bell/phone within reach;with bed alarm set;with SCD's reapplied Nurse Communication: Mobility status(Pt to d/c home per ortho PA) PT Visit Diagnosis: Other abnormalities of gait and mobility (R26.89);Muscle weakness (generalized) (M62.81);Difficulty in walking, not elsewhere classified (R26.2)     Time: 4503-8882 PT Time Calculation (min) (ACUTE ONLY): 29 min  Charges:  $Gait Training: 8-22 mins $Therapeutic Activity: 8-22 mins                     Erasmo Leventhal , PTA Acute Rehabilitation Services Pager [phone] Office [phone]     Brandyce Dimario Eli Hose 01/20/2020, 12:35 PM

## 2020-01-20 NOTE — TOC Transition Note (Addendum)
Transition of Care [facility]) - CM/SW Discharge Note   Patient Details  Name: [name] MRN: [id number] Date of Birth: August 27, 1931  Transition of Care [facility]) CM/SW Contact:  Sharin Mons, RN Phone Number: [phone] 01/20/2020, 1:33 PM   Clinical Narrative: -  s/p L ORIF patella with autografting on 01/19/20, hx of  carpal tunnel, CAD, DVT, headaches, HTN, HLD, lumbar fusion, L TKR 04/2019.  Pt will transition to home today with wife. MD states will f/u with PT needs with pt  @ Monday's appointment. Pt without DME needs. Pt without Rx med concerns or affordability.  Son to provide transportation home.    Final next level of care: Home/Self Care Barriers to Discharge: No Barriers Identified   Patient Goals and CMS Choice        Discharge Placement                       Discharge Plan and Services      Social Determinants of Health (SDOH) Interventions     Readmission Risk Interventions No flowsheet data found.

## 2020-01-20 NOTE — Plan of Care (Signed)

## 2020-01-20 NOTE — Plan of Care (Signed)

## 2020-01-21 ENCOUNTER — Encounter (HOSPITAL_COMMUNITY): Payer: Self-pay | Admitting: Orthopedic Surgery

## 2020-01-21 ENCOUNTER — Telehealth: Payer: Self-pay | Admitting: *Deleted

## 2020-01-21 NOTE — Telephone Encounter (Signed)
Pt was on TCM report admitted 01/19/20 for Knee fracture. Pt underwent a LEFT OPEN REDUCTION INTERNAL (ORIF) FIXATION PATELLA WITH AUTO GRAFTING USING HAMSTRING REINFORCEMENT. Pt tolerated procedure well and D/C 01/20/20 and will f/u w/orthopedic surgeon.Marland KitchenJohny Chess

## 2020-01-21 NOTE — Progress Notes (Signed)
TC to patient in follow up from report of missing wallet. Patient stated that he did find his walletin the bottom of his belongings bag.

## 2020-01-25 ENCOUNTER — Ambulatory Visit (INDEPENDENT_AMBULATORY_CARE_PROVIDER_SITE_OTHER): Payer: Medicare Other

## 2020-01-25 ENCOUNTER — Ambulatory Visit (INDEPENDENT_AMBULATORY_CARE_PROVIDER_SITE_OTHER): Payer: Medicare Other | Admitting: Orthopedic Surgery

## 2020-01-25 DIAGNOSIS — S82032G Displaced transverse fracture of left patella, subsequent encounter for closed fracture with delayed healing: Secondary | ICD-10-CM | POA: Diagnosis not present

## 2020-01-28 ENCOUNTER — Encounter: Payer: Self-pay | Admitting: Orthopedic Surgery

## 2020-01-28 NOTE — Progress Notes (Signed)
Post-Op Visit Note   Patient: [name]           Date of Birth: 09/27/1931           MRN: [id number] Visit Date: 01/25/2020 PCP: Jordan Koch, MD   Assessment & Plan:  Chief Complaint:  Chief Complaint  Patient presents with  . Left Knee - Routine Post Op   Visit Diagnoses:  1. Displaced transverse fracture of left patella, subsequent encounter for closed fracture with delayed healing     Plan: [name] is a patient who is now about a week out left knee ORIF patella.  He is in a knee immobilizer.  He does have better extension against gravity.  Radiographs do show some displacement of the fracture compared to immediate postoperative films.  Hard to know exactly what is going on at home with [facility] in terms of how much he has been in the knee immobilizer.  On exam he does have better extension strength than he had before.  Incision is intact.  Like to bring him back in a week to remove the sutures.  Continue in knee immobilizer for the next 2 weeks.  No calf tenderness today negative Homans.  Follow-Up Instructions: Return in about 1 week (around 02/01/2020).   Orders:  Orders Placed This Encounter  Procedures  . XR Knee 1-2 Views Left   No orders of the defined types were placed in this encounter.   Imaging: No results found.  PMFS History: Patient Active Problem List   Diagnosis Date Noted  . Left patella fracture 01/19/2020  . Rib pain on left side 09/18/2019  . Arthritis of left knee 05/07/2019  . Pre-operative clearance 04/28/2019  . Acute diastolic CHF (congestive heart failure) ([place]) 08/10/2018  . Leg wound, left 03/19/2018  . Right arm pain 11/11/2017  . Primary osteoarthritis of right foot 08/21/2017  . Syncope 02/27/2017  . Arthritis of right ankle 12/18/2016  . Carpal tunnel syndrome, bilateral 09/04/2016  . Leg swelling 08/16/2016  . Hand weakness 07/12/2016  . Degenerative arthritis of left knee 06/28/2016  . Iron deficiency anemia  03/16/2016  . Arthritis of left acromioclavicular joint 01/03/2016  . Rotator cuff disorder 01/03/2016  . Osteoarthritis of left knee 04/30/2015  . Baker's cyst of knee 04/30/2015  . Esophageal stricture 04/26/2015  . Benign prostatic hyperplasia 02/21/2015  . Constipation 02/21/2015  . Myalgia and myositis 11/18/2014  . Polyarticular arthritis 07/19/2013  . Chronic radicular low back pain 12/15/2012  . History of gout 02/20/2010  . ERECTILE DYSFUNCTION, ORGANIC 02/20/2010  . Hyperlipidemia 10/18/2008  . CAD 08/18/2007  . Essential hypertension 05/09/2007  . ALLERGIC RHINITIS 03/27/2007   Past Medical History:  Diagnosis Date  . ACTINIC KERATOSIS, HEAD 03/23/2008  . ALLERGIC RHINITIS 03/27/2007  . Arthritis   . BENIGN PROSTATIC HYPERTROPHY, WITH URINARY OBSTRUCTION 10/16/2007  . CAD 08/18/2007   "patient denies any issues with heart, does not see cardiologist"  . Carpal tunnel syndrome, bilateral 09/04/2016  . CARPAL TUNNEL SYNDROME, LEFT 04/13/2008  . Chronic back pain   . Chronic neck pain   . Complication of anesthesia    left lower jaw teeth have been injured in the past and he has lost those, now has 2 loose front teeth due to a mouth injury on 01/02/20  . DVT (deep venous thrombosis) ([place])   . ERECTILE DYSFUNCTION, ORGANIC 02/20/2010  . Gout, unspecified 02/20/2010  . Headache(784.0)    migraines hx of  . History of melanoma   .  HYPERLIPIDEMIA, WITH HIGH HDL 10/18/2008  . HYPERTENSION 05/09/2007   sees Dr. Benay Pillow  . KNEE PAIN 10/10/2009  . LOW BACK PAIN 03/27/2007  . NEOPLASM, MALIGNANT, SKIN, TRUNK 05/09/2007   squamous cell on scalp, sideburn and ear (right)  . Patellar tendinitis 10/10/2009  . Sciatic nerve pain    on Oxycodone as needed  . SKIN CANCER, HX OF 03/27/2007  . UNS ADVRS EFF UNS RX MEDICINAL&BIOLOGICAL SBSTNC 08/18/2007  . Urinary urgency     Family History  Problem Relation Age of Onset  . Dementia Mother   . Anemia Father   . Heart disease Father   .  Stroke Sister        ICH  . Lung disease Sister     Past Surgical History:  Procedure Laterality Date  . APPENDECTOMY  1969  . APPLICATION OF WOUND VAC Left 01/19/2020   Procedure: Application Of Wound Vac;  Surgeon: [name], MD;  Location: [place];  Service: Orthopedics;  Laterality: Left;  . BACK SURGERY    . BLEPHAROPLASTY    . CARPAL TUNNEL RELEASE    . CHOLECYSTECTOMY    . ESOPHAGOGASTRODUODENOSCOPY (EGD) WITH PROPOFOL N/A 04/26/2015   Procedure: ESOPHAGOGASTRODUODENOSCOPY (EGD) WITH PROPOFOL;  Surgeon: Inda Castle, MD;  Location: [facility];  Service: Endoscopy;  Laterality: N/A;  . EYE SURGERY     bilateral cataracts removed and vitrectomies  . JOINT REPLACEMENT Right    knee  . KNEE ARTHROSCOPY    . LAMINECTOMY    . LATERAL FUSION LUMBAR SPINE, TRANSVERSE    . LUMBAR LAMINECTOMY/DECOMPRESSION MICRODISCECTOMY Left 11/03/2012   Procedure: LUMBAR LAMINECTOMY/DECOMPRESSION MICRODISCECTOMY 1 LEVEL;  Surgeon: Hosie Spangle, MD;  Location: [place] [facility];  Service: Neurosurgery;  Laterality: Left;  LEFT L5S1 laminotomy foraminotomy and possible microdiskectomy  . MELANOMA EXCISION    . NASAL SINUS SURGERY    . ORIF PATELLA Left 01/19/2020   Procedure: LEFT OPEN REDUCTION INTERNAL (ORIF) FIXATION PATELLA WITH AUTO GRAFTING USING HAMSTRING REINFORCEMENT;  Surgeon: [name], MD;  Location: [place];  Service: Orthopedics;  Laterality: Left;  . POSTERIOR CERVICAL FUSION/FORAMINOTOMY Right 10/01/2012   Procedure: POSTERIOR CERVICAL FUSION/FORAMINOTOMY LEVEL 1;  Surgeon: Hosie Spangle, MD;  Location: [place] [facility];  Service: Neurosurgery;  Laterality: Right;  Right Cervical seven thoracic one Cervical laminectomy/foraminotomy with posterior cervical arthrodesis   . POSTERIOR LUMBAR FUSION N/A 05/23/2013   Procedure: exploration of lumbar wound, explantation of left interbody implant.;  Surgeon: Hosie Spangle, MD;  Location: [place];  Service: Neurosurgery;   Laterality: N/A;  . RECTAL SURGERY     Fissure  . TOTAL KNEE ARTHROPLASTY Left 05/07/2019   Procedure: LEFT TOTAL KNEE ARTHROPLASTY;  Surgeon: [name], MD;  Location: [place];  Service: Orthopedics;  Laterality: Left;  Marland Kitchen VITRECTOMY     right and left 2013   Social History   Occupational History  . Occupation: Retired  Tobacco Use  . Smoking status: Former Smoker    Packs/day: 1.00    Years: 33.00    Pack years: 33.00    Quit date: 09/29/1981    Years since quitting: 38.3  . Smokeless tobacco: Never Used  Vaping Use  . Vaping Use: Never used  Substance and Sexual Activity  . Alcohol use: Yes    Alcohol/week: 3.0 standard drinks    Types: 3 Standard drinks or equivalent per week    Comment: Social  . Drug use: No  . Sexual activity: Yes  Partners: Female    Comment: Married

## 2020-02-01 ENCOUNTER — Ambulatory Visit (INDEPENDENT_AMBULATORY_CARE_PROVIDER_SITE_OTHER): Payer: Medicare Other | Admitting: Orthopedic Surgery

## 2020-02-01 DIAGNOSIS — S82032G Displaced transverse fracture of left patella, subsequent encounter for closed fracture with delayed healing: Secondary | ICD-10-CM

## 2020-02-02 ENCOUNTER — Encounter: Payer: Self-pay | Admitting: Internal Medicine

## 2020-02-02 ENCOUNTER — Ambulatory Visit (INDEPENDENT_AMBULATORY_CARE_PROVIDER_SITE_OTHER): Payer: Medicare Other | Admitting: Internal Medicine

## 2020-02-02 ENCOUNTER — Other Ambulatory Visit: Payer: Self-pay

## 2020-02-02 DIAGNOSIS — L03115 Cellulitis of right lower limb: Secondary | ICD-10-CM | POA: Diagnosis not present

## 2020-02-02 MED ORDER — SPIRONOLACTONE 25 MG PO TABS: 50.0000 mg | ORAL_TABLET | Freq: Every day | ORAL | 1 refills | Status: DC

## 2020-02-02 MED ORDER — SULFAMETHOXAZOLE-TRIMETHOPRIM 800-160 MG PO TABS: 1.0000 | ORAL_TABLET | Freq: Two times a day (BID) | ORAL | 0 refills | Status: DC

## 2020-02-02 NOTE — Progress Notes (Signed)
Subjective:   Patient ID: [name], male    DOB: 04/03/1932, 86 y.o.   MRN: [id number]  HPI The patient is an 86 YO man coming in for swelling right leg around a gash he got about 3-4 weeks ago. He smashed into the side of his bed and cut the skin. This did bleed initially. He kept covered. Now have covering to the wound. He has noticed in the last 1-2 days it is getting red around the area and leg itself is swelling. He had surgery on the left knee about 3 weeks ago and is still in knee immobilizer from that. That leg is also swollen some but this has been present and stable since surgery. He does take prednisone chronically for his arthritis and oxycodone for pain management but this is not helping with the pain in the right leg.   Review of Systems  Constitutional: Negative.   HENT: Negative.   Eyes: Negative.   Respiratory: Negative for cough, chest tightness and shortness of breath.   Cardiovascular: Positive for leg swelling. Negative for chest pain and palpitations.  Gastrointestinal: Negative for abdominal distention, abdominal pain, constipation, diarrhea, nausea and vomiting.  Musculoskeletal: Positive for arthralgias and myalgias.  Skin: Positive for rash and wound.  Neurological: Negative.   Psychiatric/Behavioral: Negative.     Objective:  Physical Exam Constitutional:      Appearance: He is well-developed.  HENT:     Head: Normocephalic and atraumatic.  Cardiovascular:     Rate and Rhythm: Normal rate and regular rhythm.  Pulmonary:     Effort: Pulmonary effort is normal. No respiratory distress.     Breath sounds: Normal breath sounds. No wheezing or rales.  Abdominal:     General: Bowel sounds are normal. There is no distension.     Palpations: Abdomen is soft.     Tenderness: There is no abdominal tenderness. There is no rebound.  Musculoskeletal:        General: Tenderness present.     Cervical back: Normal range of motion.  Skin:    General: Skin is  warm and dry.     Findings: Rash present.     Comments: Wound on the right lower leg about 3-4 cm long and about 2 mm wide eliptical in shape with eschar white covering, not stageable, no purulent drainage present or abscess appreciated under wound, wound not tender to touch, redness surrounding the wound and warmth to the entire shin area with some tenderness to palpation and 2+ swelling  Neurological:     Mental Status: He is alert and oriented to person, place, and time.     Coordination: Coordination normal.     Vitals:   02/02/20 1439  BP: 124/82  Pulse: 92  Temp: 98.5 F (36.9 C)  TempSrc: Oral  SpO2: 96%  Weight: 215 lb (97.5 kg)  Height: 6' (1.829 m)    This visit occurred during the SARS-CoV-2 public health emergency.  Safety protocols were in place, including screening questions prior to the visit, additional usage of staff PPE, and extensive cleaning of exam room while observing appropriate contact time as indicated for disinfecting solutions.   Assessment & Plan:  Visit time 20 minutes in face to face communication with patient and coordination of care, additional 10 minutes spent in record review, coordination or care, ordering tests, communicating/referring to other healthcare professionals, documenting in medical records all on the same day of the visit for total time 30 minutes spent on the  visit.

## 2020-02-02 NOTE — Patient Instructions (Addendum)
We have sent in a strong antibiotic called bactrim to take 1 pill twice a day for 10 days for the leg.   Let us know if this is helping in 2-3 days or not.   We will have you double the spironolactone (2 pills a day) for 5 days to help with the fluid.

## 2020-02-02 NOTE — Discharge Summary (Signed)
Physician Discharge Summary      Patient ID: [name] MRN: [id number] DOB/AGE: 1931-11-04 86 y.o.  Admit date: 01/19/2020 Discharge date: 01/20/2020  Admission Diagnoses:  Active Problems:   Left patella fracture   Discharge Diagnoses:  Same  Surgeries: Procedure(s): LEFT OPEN REDUCTION INTERNAL (ORIF) FIXATION PATELLA WITH AUTO GRAFTING USING HAMSTRING REINFORCEMENT Application Of Wound Vac on 01/19/2020   Consultants:   Discharged Condition: Stable  Hospital Course: [name] is an 86 y.o. male who was admitted 01/19/2020 with a chief complaint of left knee pain, and found to have a diagnosis of left patella fracture.  They were brought to the operating room on 01/19/2020 and underwent the above named procedures.  Pt awoke from anesthesia without complication and was transferred to the floor. On POD1, patient's pain was controlled and he ambulated well.  He was discharged home.  Pt will f/u with Dr. Marlou Sa in clinic in ~1 week.  Wound vac will be removed at that time and will determine whether or not to remove sutures at that time as well.   Antibiotics given:  Anti-infectives (From admission, onward)   Start     Dose/Rate Route Frequency Ordered Stop   01/19/20 1600  ceFAZolin (ANCEF) IVPB 2g/100 mL premix        2 g 200 mL/hr over 30 Minutes Intravenous Every 8 hours 01/19/20 1249 01/20/20 0856   01/19/20 0847  vancomycin (VANCOCIN) powder  Status:  Discontinued          As needed 01/19/20 0847 01/19/20 1055   01/19/20 0645  ceFAZolin (ANCEF) IVPB 2g/100 mL premix        2 g 200 mL/hr over 30 Minutes Intravenous On call to O.R. 01/19/20 0277 01/19/20 4128    .  Recent vital signs:  Vitals:   01/20/20 0555 01/20/20 0818  BP: 127/62 112/76  Pulse: 93 98  Resp: 16 16  Temp: 98.4 F (36.9 C) 98.4 F (36.9 C)  SpO2: 93% 94%    Recent laboratory studies:  Results for orders placed or performed during the hospital encounter of 01/19/20  CBC  Result Value Ref  Range   WBC 10.3 4.0 - 10.5 K/uL   RBC 4.34 4.22 - 5.81 MIL/uL   Hemoglobin 12.4 (L) 13.0 - 17.0 g/dL   HCT 40.1 39 - 52 %   MCV 92.4 80.0 - 100.0 fL   MCH 28.6 26.0 - 34.0 pg   MCHC 30.9 30.0 - 36.0 g/dL   RDW 15.0 11.5 - 15.5 %   Platelets 353 150 - 400 K/uL   nRBC 0.0 0.0 - 0.2 %  Basic metabolic panel  Result Value Ref Range   Sodium 135 135 - 145 mmol/L   Potassium 4.3 3.5 - 5.1 mmol/L   Chloride 104 98 - 111 mmol/L   CO2 22 22 - 32 mmol/L   Glucose, Bld 125 (H) 70 - 99 mg/dL   BUN 29 (H) 8 - 23 mg/dL   Creatinine, Ser 1.29 (H) 0.61 - 1.24 mg/dL   Calcium 8.5 (L) 8.9 - 10.3 mg/dL   GFR calc non Af Amer 50 (L) >60 mL/min   GFR calc Af Amer 58 (L) >60 mL/min   Anion gap 9 5 - 15  I-STAT, chem 8  Result Value Ref Range   Sodium 138 135 - 145 mmol/L   Potassium 4.3 3.5 - 5.1 mmol/L   Chloride 104 98 - 111 mmol/L   BUN 29 (H) 8 - 23 mg/dL  Creatinine, Ser 1.20 0.61 - 1.24 mg/dL   Glucose, Bld 127 (H) 70 - 99 mg/dL   Calcium, Ion 1.12 (L) 1.15 - 1.40 mmol/L   TCO2 22 22 - 32 mmol/L   Hemoglobin 12.9 (L) 13.0 - 17.0 g/dL   HCT 38.0 (L) 39 - 52 %    Discharge Medications:   Allergies as of 01/20/2020      Reactions   Gabapentin    Lower extremity edema   Lyrica [pregabalin] Swelling      Medication List    STOP taking these medications   [employer] VITAMIN B12 1000 MCG tablet Generic drug: cyanocobalamin   finasteride 5 MG tablet Commonly known as: PROSCAR     TAKE these medications   amLODipine 10 MG tablet Commonly known as: NORVASC TAKE 1 TABLET BY MOUTH EVERY DAY   aspirin 81 MG chewable tablet Chew 1 tablet (81 mg total) by mouth daily.   carvedilol 25 MG tablet Commonly known as: COREG TAKE 1 TABLET BY MOUTH 2 TIMES DAILY WITH A MEAL What changed: See the new instructions.   [employer] Magnesium Oxide 250 MG Tabs Generic drug: Magnesium Oxide -Mg Supplement TAKE 1 TABLET BY MOUTH EVERY DAY What changed:   how much to take  when to take this  reasons  to take this Notes to patient: Resume home regimen   doxycycline 100 MG capsule Commonly known as: VIBRAMYCIN Take 100 mg by mouth 2 (two) times daily. Notes to patient: Resume home regimen   fluticasone 50 MCG/ACT nasal spray Commonly known as: FLONASE Place 1 spray into both nostrils daily. Notes to patient: Resume home regimen   hydrALAZINE 50 MG tablet Commonly known as: APRESOLINE TAKE 1 TABLET BY MOUTH TWICE A DAY What changed: when to take this   hydrocortisone 2.5 % cream Apply 1 application topically daily as needed (itching). Notes to patient: Resume home regimen   methocarbamol 500 MG tablet Commonly known as: ROBAXIN Take 1 tablet (500 mg total) by mouth every 8 (eight) hours as needed for muscle spasms. What changed:   medication strength  how much to take   Oxycodone HCl 10 MG Tabs Take 1 tablet (10 mg total) by mouth every 4 (four) hours as needed (pain score 4-6). What changed: when to take this   predniSONE 10 MG tablet Commonly known as: DELTASONE TAKE 1-4 TABLETS (10-40 MG TOTAL) BY MOUTH DAILY WITH BREAKFAST. What changed: how much to take Notes to patient: Resume home regimen   tamsulosin 0.4 MG Caps capsule Commonly known as: FLOMAX TAKE 1 CAPSULE BY MOUTH EVERY DAY   triamcinolone cream 0.5 % Commonly known as: KENALOG APPLY 1 APPLICATION TOPICALLY 2 (TWO) TIMES DAILY AS NEEDED (DRY SKIN, ITCHING). Notes to patient: Resume home regimen       Diagnostic Studies: DG Knee Left Port  Result Date: 01/19/2020 CLINICAL DATA:  Status post fixation of a fracture of the inferior pole of the patella today. Subsequent encounter. EXAM: PORTABLE LEFT KNEE - 1-2 VIEW COMPARISON:  Plain films left knee 05/18/2019. CT left knee 11/06/2019. Plain films left tib fib 12/27/2019. FINDINGS: Total knee arthroplasty is in place. There is gas in the soft tissues from surgery. Again seen is a fracture of the inferior pole of the patella. Based on comparison with the  05/18/2019 plain films and the prior CT, the fracture fragment is rotated 90 degrees clockwise with the inferior pole directed anteriorly and inverted so that the articular surface is directed superiorly. The fragment is  inferiorly displaced 0.3 cm. IMPRESSION: Status post fixation of a fracture of the inferior pole of the patella in a patient with a total knee arthroplasty. Based on comparison with the prior exams, the fragment is rotated 90 degrees clockwise and inverted so that the inferior pole of the patella projects anteriorly and the articular surface is directed superiorly. Electronically Signed   By: Inge Rise M.D.   On: 01/19/2020 14:55   XR Knee 1-2 Views Left  Result Date: 01/28/2020 AP lateral left knee reviewed.  Inferior pole patella fragment has displaced distally approximately 1/2 cm compared to immediate postoperative radiographs.  No new fracture.  Remainder of total knee replacement components in good alignment and position.   Disposition: Discharge disposition: 01-Home or Self Care       Discharge Instructions    Call MD / Call 911   Complete by: As directed    If you experience chest pain or shortness of breath, CALL 911 and be transported to the hospital emergency room.  If you develope a fever above 101 F, pus (white drainage) or increased drainage or redness at the wound, or calf pain, call your surgeon's office.   Constipation Prevention   Complete by: As directed    Drink plenty of fluids.  Prune juice may be helpful.  You may use a stool softener, such as Colace (over the counter) 100 mg twice a day.  Use MiraLax (over the counter) for constipation as needed.   Diet - low sodium heart healthy   Complete by: As directed    Discharge instructions   Complete by: As directed    Weight bear as tolerated on the operative leg while using the knee immobilizer.  Do not bend your knee.  Remain in knee immobilizer.  Okay for shower but do not take bath or go in a  pool.  Keep leg straight while in shower.  Follow-up with Dr. Marlou Sa in clinic on Monday 01/25/2020.  Someone from the office will reach out to schedule appointment on that date.  Call the office at [phone] with any questions or concerns.   Increase activity slowly as tolerated   Complete by: As directed          Signed: Donella Stade 02/02/2020, 6:41 PM

## 2020-02-03 ENCOUNTER — Encounter: Payer: Self-pay | Admitting: Internal Medicine

## 2020-02-03 ENCOUNTER — Encounter: Payer: Self-pay | Admitting: Orthopedic Surgery

## 2020-02-03 DIAGNOSIS — L03115 Cellulitis of right lower limb: Secondary | ICD-10-CM | POA: Insufficient documentation

## 2020-02-03 NOTE — Assessment & Plan Note (Signed)
Rx bactrim 10 day course and if not improving needs follow up visit. No signs of abscess around the wound and wound itself without signs of infection. No calf tenderness but if swelling not improving could consider bilateral doppler given recently surgery.

## 2020-02-03 NOTE — Progress Notes (Signed)
Post-Op Visit Note   Patient: [name]           Date of Birth: Oct 30, 1931           MRN: [id number] Visit Date: 02/01/2020 PCP: Jordan Koch, MD   Assessment & Plan:  Chief Complaint:  Chief Complaint  Patient presents with  . Left Knee - Follow-up   Visit Diagnoses:  1. Displaced transverse fracture of left patella, subsequent encounter for closed fracture with delayed healing     Plan: [name] is now 2 weeks out left knee patella fracture open reduction internal fixation.  He has been doing reasonably well.  Sutures removed today.  He does have full extension of that left leg.  He may or may not have been wearing his knee immobilizer.  Radiographs immediately 1 week postop did show redisplacement of the fracture but he does have hamstring reinforcement around the superior pole of the patella.  On examination there is no extension lag.  He flexes to 90 degrees which I have not recommended and definitely do not recommend at this time.  I think he needs to keep his knee straight for the next 2 weeks.  He can do that full-time in that knee immobilizer when he is walking which would be my recommendation.  I am not sure he is going to follow that though.  I did explain to him that the hamstring tendon really needs to heal into that lateral side before he is too active with his leg.  Come back in 2 weeks for clinical recheck only with no radiographs.  No calf tenderness today.  Overall there is the expected amount of redness in the patellar region but does not look infected.  Follow-Up Instructions: Return in about 2 weeks (around 02/15/2020).   Orders:  No orders of the defined types were placed in this encounter.  No orders of the defined types were placed in this encounter.   Imaging: No results found.  PMFS History: Patient Active Problem List   Diagnosis Date Noted  . Cellulitis of right leg 02/03/2020  . Left patella fracture 01/19/2020  . Rib pain on left side  09/18/2019  . Arthritis of left knee 05/07/2019  . Pre-operative clearance 04/28/2019  . Acute diastolic CHF (congestive heart failure) ([place]) 08/10/2018  . Leg wound, left 03/19/2018  . Right arm pain 11/11/2017  . Primary osteoarthritis of right foot 08/21/2017  . Syncope 02/27/2017  . Arthritis of right ankle 12/18/2016  . Carpal tunnel syndrome, bilateral 09/04/2016  . Leg swelling 08/16/2016  . Hand weakness 07/12/2016  . Degenerative arthritis of left knee 06/28/2016  . Iron deficiency anemia 03/16/2016  . Arthritis of left acromioclavicular joint 01/03/2016  . Rotator cuff disorder 01/03/2016  . Osteoarthritis of left knee 04/30/2015  . Baker's cyst of knee 04/30/2015  . Esophageal stricture 04/26/2015  . Benign prostatic hyperplasia 02/21/2015  . Constipation 02/21/2015  . Myalgia and myositis 11/18/2014  . Polyarticular arthritis 07/19/2013  . Chronic radicular low back pain 12/15/2012  . History of gout 02/20/2010  . ERECTILE DYSFUNCTION, ORGANIC 02/20/2010  . Hyperlipidemia 10/18/2008  . CAD 08/18/2007  . Essential hypertension 05/09/2007  . ALLERGIC RHINITIS 03/27/2007   Past Medical History:  Diagnosis Date  . ACTINIC KERATOSIS, HEAD 03/23/2008  . ALLERGIC RHINITIS 03/27/2007  . Arthritis   . BENIGN PROSTATIC HYPERTROPHY, WITH URINARY OBSTRUCTION 10/16/2007  . CAD 08/18/2007   "patient denies any issues with heart, does not see cardiologist"  .  Carpal tunnel syndrome, bilateral 09/04/2016  . CARPAL TUNNEL SYNDROME, LEFT 04/13/2008  . Chronic back pain   . Chronic neck pain   . Complication of anesthesia    left lower jaw teeth have been injured in the past and he has lost those, now has 2 loose front teeth due to a mouth injury on 01/02/20  . DVT (deep venous thrombosis) ([place])   . ERECTILE DYSFUNCTION, ORGANIC 02/20/2010  . Gout, unspecified 02/20/2010  . Headache(784.0)    migraines hx of  . History of melanoma   . HYPERLIPIDEMIA, WITH HIGH HDL 10/18/2008  .  HYPERTENSION 05/09/2007   sees Dr. Benay Pillow  . KNEE PAIN 10/10/2009  . LOW BACK PAIN 03/27/2007  . NEOPLASM, MALIGNANT, SKIN, TRUNK 05/09/2007   squamous cell on scalp, sideburn and ear (right)  . Patellar tendinitis 10/10/2009  . Sciatic nerve pain    on Oxycodone as needed  . SKIN CANCER, HX OF 03/27/2007  . UNS ADVRS EFF UNS RX MEDICINAL&BIOLOGICAL SBSTNC 08/18/2007  . Urinary urgency     Family History  Problem Relation Age of Onset  . Dementia Mother   . Anemia Father   . Heart disease Father   . Stroke Sister        ICH  . Lung disease Sister     Past Surgical History:  Procedure Laterality Date  . APPENDECTOMY  1969  . APPLICATION OF WOUND VAC Left 01/19/2020   Procedure: Application Of Wound Vac;  Surgeon: [name], MD;  Location: [facility];  Service: Orthopedics;  Laterality: Left;  . BACK SURGERY    . BLEPHAROPLASTY    . CARPAL TUNNEL RELEASE    . CHOLECYSTECTOMY    . ESOPHAGOGASTRODUODENOSCOPY (EGD) WITH PROPOFOL N/A 04/26/2015   Procedure: ESOPHAGOGASTRODUODENOSCOPY (EGD) WITH PROPOFOL;  Surgeon: Inda Castle, MD;  Location: [facility];  Service: Endoscopy;  Laterality: N/A;  . EYE SURGERY     bilateral cataracts removed and vitrectomies  . JOINT REPLACEMENT Right    knee  . KNEE ARTHROSCOPY    . LAMINECTOMY    . LATERAL FUSION LUMBAR SPINE, TRANSVERSE    . LUMBAR LAMINECTOMY/DECOMPRESSION MICRODISCECTOMY Left 11/03/2012   Procedure: LUMBAR LAMINECTOMY/DECOMPRESSION MICRODISCECTOMY 1 LEVEL;  Surgeon: Hosie Spangle, MD;  Location: [facility];  Service: Neurosurgery;  Laterality: Left;  LEFT L5S1 laminotomy foraminotomy and possible microdiskectomy  . MELANOMA EXCISION    . NASAL SINUS SURGERY    . ORIF PATELLA Left 01/19/2020   Procedure: LEFT OPEN REDUCTION INTERNAL (ORIF) FIXATION PATELLA WITH AUTO GRAFTING USING HAMSTRING REINFORCEMENT;  Surgeon: [name], MD;  Location: [facility];  Service: Orthopedics;  Laterality: Left;  . POSTERIOR  CERVICAL FUSION/FORAMINOTOMY Right 10/01/2012   Procedure: POSTERIOR CERVICAL FUSION/FORAMINOTOMY LEVEL 1;  Surgeon: Hosie Spangle, MD;  Location: [facility];  Service: Neurosurgery;  Laterality: Right;  Right Cervical seven thoracic one Cervical laminectomy/foraminotomy with posterior cervical arthrodesis   . POSTERIOR LUMBAR FUSION N/A 05/23/2013   Procedure: exploration of lumbar wound, explantation of left interbody implant.;  Surgeon: Hosie Spangle, MD;  Location: [facility];  Service: Neurosurgery;  Laterality: N/A;  . RECTAL SURGERY     Fissure  . TOTAL KNEE ARTHROPLASTY Left 05/07/2019   Procedure: LEFT TOTAL KNEE ARTHROPLASTY;  Surgeon: [name], MD;  Location: [facility];  Service: Orthopedics;  Laterality: Left;  Marland Kitchen VITRECTOMY     right and left 2013   Social History   Occupational History  . Occupation: Retired  Tobacco Use  . Smoking status: Former Smoker    Packs/day: 1.00    Years: 33.00    Pack years: 33.00    Quit date: 09/29/1981    Years since quitting: 38.3  . Smokeless tobacco: Never Used  Vaping Use  . Vaping Use: Never used  Substance and Sexual Activity  . Alcohol use: Yes    Alcohol/week: 3.0 standard drinks    Types: 3 Standard drinks or equivalent per week    Comment: Social  . Drug use: No  . Sexual activity: Yes    Partners: Female    Comment: Married

## 2020-02-09 ENCOUNTER — Encounter: Payer: Self-pay | Admitting: Internal Medicine

## 2020-02-17 ENCOUNTER — Ambulatory Visit (INDEPENDENT_AMBULATORY_CARE_PROVIDER_SITE_OTHER): Payer: Medicare Other | Admitting: Orthopedic Surgery

## 2020-02-17 DIAGNOSIS — S82032G Displaced transverse fracture of left patella, subsequent encounter for closed fracture with delayed healing: Secondary | ICD-10-CM

## 2020-02-20 ENCOUNTER — Other Ambulatory Visit: Payer: Self-pay | Admitting: Internal Medicine

## 2020-02-21 ENCOUNTER — Encounter: Payer: Self-pay | Admitting: Orthopedic Surgery

## 2020-02-21 NOTE — Progress Notes (Signed)
Post-Op Visit Note   Patient: [name]           Date of Birth: [birth year]           MRN: [id number] Visit Date: 02/17/2020 PCP: Hoyt Koch, MD   Assessment & Plan:  Chief Complaint:  Chief Complaint  Patient presents with  . Left Knee - Follow-up   Visit Diagnoses:  1. Displaced transverse fracture of left patella, subsequent encounter for closed fracture with delayed healing     Plan: Patient is an 86 year old male who presents s/p left patella fracture ORIF on 01/19/2020.  He is doing well overall.  He states that he feels better each day.  He is taking oxycodone 10 mg 4 times per day.  He states that he was compliant with using the knee immobilizer as instructed.  He does note that he "overdid it yesterday" when he used a riding lawnmower to [place] his lawn.  On exam he has 5 degrees extension passively and greater than 90 degrees of flexion.  Incision is healing well with no evidence of infection or dehiscence.  Actively he has a 20 to 25 degree extension lag.  He is able do a straight leg raise and able to walk well and stand from a sitting position with his legs at a 90 degree angle.  He does have a mildly antalgic gait.  He is going to the dentist for partial dentures later today.  He is already on amoxicillin.  Plan to follow-up in 4 weeks.  Follow-Up Instructions: No follow-ups on file.   Orders:  No orders of the defined types were placed in this encounter.  No orders of the defined types were placed in this encounter.   Imaging: No results found.  PMFS History: Patient Active Problem List   Diagnosis Date Noted  . Cellulitis of right leg 02/03/2020  . Left patella fracture 01/19/2020  . Rib pain on left side 09/18/2019  . Arthritis of left knee 05/07/2019  . Pre-operative clearance 04/28/2019  . Acute diastolic CHF (congestive heart failure) ([place]) 08/10/2018  . Leg wound, left 03/19/2018  . Right arm pain 11/11/2017  . Primary osteoarthritis of  right foot 08/21/2017  . Syncope 02/27/2017  . Arthritis of right ankle 12/18/2016  . Carpal tunnel syndrome, bilateral 09/04/2016  . Leg swelling 08/16/2016  . Hand weakness 07/12/2016  . Degenerative arthritis of left knee 06/28/2016  . Iron deficiency anemia 03/16/2016  . Arthritis of left acromioclavicular joint 01/03/2016  . Rotator cuff disorder 01/03/2016  . Osteoarthritis of left knee 04/30/2015  . Baker's cyst of knee 04/30/2015  . Esophageal stricture 04/26/2015  . Benign prostatic hyperplasia 02/21/2015  . Constipation 02/21/2015  . Myalgia and myositis 11/18/2014  . Polyarticular arthritis 07/19/2013  . Chronic radicular low back pain 12/15/2012  . History of gout 02/20/2010  . ERECTILE DYSFUNCTION, ORGANIC 02/20/2010  . Hyperlipidemia 10/18/2008  . CAD 08/18/2007  . Essential hypertension 05/09/2007  . ALLERGIC RHINITIS 03/27/2007   Past Medical History:  Diagnosis Date  . ACTINIC KERATOSIS, HEAD 03/23/2008  . ALLERGIC RHINITIS 03/27/2007  . Arthritis   . BENIGN PROSTATIC HYPERTROPHY, WITH URINARY OBSTRUCTION 10/16/2007  . CAD 08/18/2007   "patient denies any issues with heart, does not see cardiologist"  . Carpal tunnel syndrome, bilateral 09/04/2016  . CARPAL TUNNEL SYNDROME, LEFT 04/13/2008  . Chronic back pain   . Chronic neck pain   . Complication of anesthesia    left lower jaw teeth have  been injured in the past and he has lost those, now has 2 loose front teeth due to a mouth injury on 01/02/20  . DVT (deep venous thrombosis) ([place])   . ERECTILE DYSFUNCTION, ORGANIC 02/20/2010  . Gout, unspecified 02/20/2010  . Headache(784.0)    migraines hx of  . History of melanoma   . HYPERLIPIDEMIA, WITH HIGH HDL 10/18/2008  . HYPERTENSION 05/09/2007   sees Dr. Benay Pillow  . KNEE PAIN 10/10/2009  . LOW BACK PAIN 03/27/2007  . NEOPLASM, MALIGNANT, SKIN, TRUNK 05/09/2007   squamous cell on scalp, sideburn and ear (right)  . Patellar tendinitis 10/10/2009  . Sciatic nerve  pain    on Oxycodone as needed  . SKIN CANCER, HX OF 03/27/2007  . UNS ADVRS EFF UNS RX MEDICINAL&BIOLOGICAL SBSTNC 08/18/2007  . Urinary urgency     Family History  Problem Relation Age of Onset  . Dementia Mother   . Anemia Father   . Heart disease Father   . Stroke Sister        ICH  . Lung disease Sister     Past Surgical History:  Procedure Laterality Date  . APPENDECTOMY  1969  . APPLICATION OF WOUND VAC Left 01/19/2020   Procedure: Application Of Wound Vac;  Surgeon: [name], MD;  Location: [facility];  Service: Orthopedics;  Laterality: Left;  . BACK SURGERY    . BLEPHAROPLASTY    . CARPAL TUNNEL RELEASE    . CHOLECYSTECTOMY    . ESOPHAGOGASTRODUODENOSCOPY (EGD) WITH PROPOFOL N/A 04/26/2015   Procedure: ESOPHAGOGASTRODUODENOSCOPY (EGD) WITH PROPOFOL;  Surgeon: Inda Castle, MD;  [facility];  Service: Endoscopy;  Laterality: N/A;  . EYE SURGERY     bilateral cataracts removed and vitrectomies  . JOINT REPLACEMENT Right    knee  . KNEE ARTHROSCOPY    . LAMINECTOMY    . LATERAL FUSION LUMBAR SPINE, TRANSVERSE    . LUMBAR LAMINECTOMY/DECOMPRESSION MICRODISCECTOMY Left 11/03/2012   Procedure: LUMBAR LAMINECTOMY/DECOMPRESSION MICRODISCECTOMY 1 LEVEL;  Surgeon: Hosie Spangle, MD;  Location: [facility];  Service: Neurosurgery;  Laterality: Left;  LEFT L5S1 laminotomy foraminotomy and possible microdiskectomy  . MELANOMA EXCISION    . NASAL SINUS SURGERY    . ORIF PATELLA Left 01/19/2020   Procedure: LEFT OPEN REDUCTION INTERNAL (ORIF) FIXATION PATELLA WITH AUTO GRAFTING USING HAMSTRING REINFORCEMENT;  Surgeon: [name], MD;  Location: [facility];  Service: Orthopedics;  Laterality: Left;  . POSTERIOR CERVICAL FUSION/FORAMINOTOMY Right 10/01/2012   Procedure: POSTERIOR CERVICAL FUSION/FORAMINOTOMY LEVEL 1;  Surgeon: Hosie Spangle, MD;  Location: [facility];  Service: Neurosurgery;  Laterality: Right;  Right Cervical seven thoracic one Cervical  laminectomy/foraminotomy with posterior cervical arthrodesis   . POSTERIOR LUMBAR FUSION N/A 05/23/2013   Procedure: exploration of lumbar wound, explantation of left interbody implant.;  Surgeon: Hosie Spangle, MD;  Location: [facility];  Service: Neurosurgery;  Laterality: N/A;  . RECTAL SURGERY     Fissure  . TOTAL KNEE ARTHROPLASTY Left 05/07/2019   Procedure: LEFT TOTAL KNEE ARTHROPLASTY;  Surgeon: [name], MD;  Location: [facility];  Service: Orthopedics;  Laterality: Left;  Marland Kitchen VITRECTOMY     right and left 2013   Social History   Occupational History  . Occupation: Retired  Tobacco Use  . Smoking status: Former Smoker    Packs/day: 1.00    Years: 33.00    Pack years: 33.00    Quit date: 09/29/1981    Years since quitting:  38.4  . Smokeless tobacco: Never Used  Vaping Use  . Vaping Use: Never used  Substance and Sexual Activity  . Alcohol use: Yes    Alcohol/week: 3.0 standard drinks    Types: 3 Standard drinks or equivalent per week    Comment: Social  . Drug use: No  . Sexual activity: Yes    Partners: Female    Comment: Married

## 2020-02-26 ENCOUNTER — Other Ambulatory Visit: Payer: Self-pay | Admitting: Internal Medicine

## 2020-03-09 DIAGNOSIS — M5416 Radiculopathy, lumbar region: Secondary | ICD-10-CM | POA: Diagnosis not present

## 2020-03-09 DIAGNOSIS — M48062 Spinal stenosis, lumbar region with neurogenic claudication: Secondary | ICD-10-CM | POA: Diagnosis not present

## 2020-03-16 ENCOUNTER — Ambulatory Visit (INDEPENDENT_AMBULATORY_CARE_PROVIDER_SITE_OTHER): Payer: Medicare Other | Admitting: Orthopedic Surgery

## 2020-03-16 DIAGNOSIS — S82032G Displaced transverse fracture of left patella, subsequent encounter for closed fracture with delayed healing: Secondary | ICD-10-CM

## 2020-03-16 NOTE — Progress Notes (Signed)
Post-Op Visit Note   Patient: [name]           Date of Birth: May 28, 1932           MRN: [id number] Visit Date: 03/16/2020 PCP: Hoyt Koch, MD   Assessment & Plan:  Chief Complaint:  Chief Complaint  Patient presents with  . Left Knee - Pain   Visit Diagnoses:  1. Displaced transverse fracture of left patella, subsequent encounter for closed fracture with delayed healing     Plan: Ennio is an 86 year old patient who is now about 7 weeks out left knee patella fixation with hamstring reinforcement.  Had left total knee replacement done back in September last year.  Reports swelling in the knee for the last 3 days but no fevers and chills.  On exam he does have a bit of a gap which is new compared to prior visits.  Last radiographs did demonstrate some displacement of that inferior pole of the patella.  Plan at this time is he can still do leg extension with about only a 5 to 10 degree lag.  He still is able to function and walk.  I would not recommend any further intervention.  Knee does not appear to be infected.  Does have a little bit of erythema around the incision but it disappears with elevation above his heart.  I would favor quad strengthening exercises just to make the extensor mechanism function he has more functional and stronger.  Follow-up with [place] in 8 weeks for final check  Follow-Up Instructions: Return in about 8 weeks (around 05/11/2020).   Orders:  No orders of the defined types were placed in this encounter.  No orders of the defined types were placed in this encounter.   Imaging: No results found.  PMFS History: Patient Active Problem List   Diagnosis Date Noted  . Cellulitis of right leg 02/03/2020  . Left patella fracture 01/19/2020  . Rib pain on left side 09/18/2019  . Arthritis of left knee 05/07/2019  . Pre-operative clearance 04/28/2019  . Acute diastolic CHF (congestive heart failure) ([place]) 08/10/2018  . Leg wound, left 03/19/2018   . Right arm pain 11/11/2017  . Primary osteoarthritis of right foot 08/21/2017  . Syncope 02/27/2017  . Arthritis of right ankle 12/18/2016  . Carpal tunnel syndrome, bilateral 09/04/2016  . Leg swelling 08/16/2016  . Hand weakness 07/12/2016  . Degenerative arthritis of left knee 06/28/2016  . Iron deficiency anemia 03/16/2016  . Arthritis of left acromioclavicular joint 01/03/2016  . Rotator cuff disorder 01/03/2016  . Osteoarthritis of left knee 04/30/2015  . Baker's cyst of knee 04/30/2015  . Esophageal stricture 04/26/2015  . Benign prostatic hyperplasia 02/21/2015  . Constipation 02/21/2015  . Myalgia and myositis 11/18/2014  . Polyarticular arthritis 07/19/2013  . Chronic radicular low back pain 12/15/2012  . History of gout 02/20/2010  . ERECTILE DYSFUNCTION, ORGANIC 02/20/2010  . Hyperlipidemia 10/18/2008  . CAD 08/18/2007  . Essential hypertension 05/09/2007  . ALLERGIC RHINITIS 03/27/2007   Past Medical History:  Diagnosis Date  . ACTINIC KERATOSIS, HEAD 03/23/2008  . ALLERGIC RHINITIS 03/27/2007  . Arthritis   . BENIGN PROSTATIC HYPERTROPHY, WITH URINARY OBSTRUCTION 10/16/2007  . CAD 08/18/2007   "patient denies any issues with heart, does not see cardiologist"  . Carpal tunnel syndrome, bilateral 09/04/2016  . CARPAL TUNNEL SYNDROME, LEFT 04/13/2008  . Chronic back pain   . Chronic neck pain   . Complication of anesthesia    left  lower jaw teeth have been injured in the past and he has lost those, now has 2 loose front teeth due to a mouth injury on 01/02/20  . DVT (deep venous thrombosis) ([place])   . ERECTILE DYSFUNCTION, ORGANIC 02/20/2010  . Gout, unspecified 02/20/2010  . Headache(784.0)    migraines hx of  . History of melanoma   . HYPERLIPIDEMIA, WITH HIGH HDL 10/18/2008  . HYPERTENSION 05/09/2007   sees Dr. Benay Pillow  . KNEE PAIN 10/10/2009  . LOW BACK PAIN 03/27/2007  . NEOPLASM, MALIGNANT, SKIN, TRUNK 05/09/2007   squamous cell on scalp, sideburn and ear  (right)  . Patellar tendinitis 10/10/2009  . Sciatic nerve pain    on Oxycodone as needed  . SKIN CANCER, HX OF 03/27/2007  . UNS ADVRS EFF UNS RX MEDICINAL&BIOLOGICAL SBSTNC 08/18/2007  . Urinary urgency     Family History  Problem Relation Age of Onset  . Dementia Mother   . Anemia Father   . Heart disease Father   . Stroke Sister        ICH  . Lung disease Sister     Past Surgical History:  Procedure Laterality Date  . APPENDECTOMY  1969  . APPLICATION OF WOUND VAC Left 01/19/2020   Procedure: Application Of Wound Vac;  Surgeon: [name], MD;  Location: [facility];  Service: Orthopedics;  Laterality: Left;  . BACK SURGERY    . BLEPHAROPLASTY    . CARPAL TUNNEL RELEASE    . CHOLECYSTECTOMY    . ESOPHAGOGASTRODUODENOSCOPY (EGD) WITH PROPOFOL N/A 04/26/2015   Procedure: ESOPHAGOGASTRODUODENOSCOPY (EGD) WITH PROPOFOL;  Surgeon: Inda Castle, MD;  [facility];  Service: Endoscopy;  Laterality: N/A;  . EYE SURGERY     bilateral cataracts removed and vitrectomies  . JOINT REPLACEMENT Right    knee  . KNEE ARTHROSCOPY    . LAMINECTOMY    . LATERAL FUSION LUMBAR SPINE, TRANSVERSE    . LUMBAR LAMINECTOMY/DECOMPRESSION MICRODISCECTOMY Left 11/03/2012   Procedure: LUMBAR LAMINECTOMY/DECOMPRESSION MICRODISCECTOMY 1 LEVEL;  Surgeon: Hosie Spangle, MD;  Location: [facility];  Service: Neurosurgery;  Laterality: Left;  LEFT L5S1 laminotomy foraminotomy and possible microdiskectomy  . MELANOMA EXCISION    . NASAL SINUS SURGERY    . ORIF PATELLA Left 01/19/2020   Procedure: LEFT OPEN REDUCTION INTERNAL (ORIF) FIXATION PATELLA WITH AUTO GRAFTING USING HAMSTRING REINFORCEMENT;  Surgeon: [name], MD;  Location: [facility];  Service: Orthopedics;  Laterality: Left;  . POSTERIOR CERVICAL FUSION/FORAMINOTOMY Right 10/01/2012   Procedure: POSTERIOR CERVICAL FUSION/FORAMINOTOMY LEVEL 1;  Surgeon: Hosie Spangle, MD;  Location: [facility];  Service: Neurosurgery;   Laterality: Right;  Right Cervical seven thoracic one Cervical laminectomy/foraminotomy with posterior cervical arthrodesis   . POSTERIOR LUMBAR FUSION N/A 05/23/2013   Procedure: exploration of lumbar wound, explantation of left interbody implant.;  Surgeon: Hosie Spangle, MD;  Location: [facility];  Service: Neurosurgery;  Laterality: N/A;  . RECTAL SURGERY     Fissure  . TOTAL KNEE ARTHROPLASTY Left 05/07/2019   Procedure: LEFT TOTAL KNEE ARTHROPLASTY;  Surgeon: [name], MD;  Location: [facility];  Service: Orthopedics;  Laterality: Left;  Marland Kitchen VITRECTOMY     right and left 2013   Social History   Occupational History  . Occupation: Retired  Tobacco Use  . Smoking status: Former Smoker    Packs/day: 1.00    Years: 33.00    Pack years: 33.00    Quit date: 09/29/1981  Years since quitting: 38.4  . Smokeless tobacco: Never Used  Vaping Use  . Vaping Use: Never used  Substance and Sexual Activity  . Alcohol use: Yes    Alcohol/week: 3.0 standard drinks    Types: 3 Standard drinks or equivalent per week    Comment: Social  . Drug use: No  . Sexual activity: Yes    Partners: Female    Comment: Married

## 2020-03-20 ENCOUNTER — Other Ambulatory Visit: Payer: Self-pay | Admitting: Internal Medicine

## 2020-03-21 ENCOUNTER — Encounter: Payer: Self-pay | Admitting: Internal Medicine

## 2020-03-24 ENCOUNTER — Other Ambulatory Visit: Payer: Self-pay | Admitting: Internal Medicine

## 2020-03-28 ENCOUNTER — Ambulatory Visit: Payer: Medicare Other | Admitting: Family

## 2020-03-28 ENCOUNTER — Ambulatory Visit: Payer: Medicare Other | Admitting: Internal Medicine

## 2020-03-29 ENCOUNTER — Other Ambulatory Visit: Payer: Self-pay

## 2020-03-29 ENCOUNTER — Ambulatory Visit (INDEPENDENT_AMBULATORY_CARE_PROVIDER_SITE_OTHER): Payer: Medicare Other | Admitting: Family

## 2020-03-29 ENCOUNTER — Ambulatory Visit (INDEPENDENT_AMBULATORY_CARE_PROVIDER_SITE_OTHER)
Admission: RE | Admit: 2020-03-29 | Discharge: 2020-03-29 | Disposition: A | Discharge status: Home / Self Care | Payer: Medicare Other | Source: Ambulatory Visit | Attending: Family | Admitting: Family

## 2020-03-29 ENCOUNTER — Encounter: Payer: Self-pay | Admitting: Family

## 2020-03-29 ENCOUNTER — Other Ambulatory Visit (INDEPENDENT_AMBULATORY_CARE_PROVIDER_SITE_OTHER): Payer: Medicare Other

## 2020-03-29 VITALS — BP 106/68 | HR 81 | Temp 98.2°F | Ht 72.0 in | Wt 203.0 lb

## 2020-03-29 DIAGNOSIS — R600 Localized edema: Secondary | ICD-10-CM

## 2020-03-29 DIAGNOSIS — J439 Emphysema, unspecified: Secondary | ICD-10-CM | POA: Diagnosis not present

## 2020-03-29 DIAGNOSIS — J449 Chronic obstructive pulmonary disease, unspecified: Secondary | ICD-10-CM | POA: Diagnosis not present

## 2020-03-29 LAB — CBC WITH DIFFERENTIAL/PLATELET
Basophils Absolute: 0.1 10*3/uL (ref 0.0–0.1)
Basophils Relative: 0.5 % (ref 0.0–3.0)
Eosinophils Absolute: 0 10*3/uL (ref 0.0–0.7)
Eosinophils Relative: 0.1 % (ref 0.0–5.0)
HCT: 40.4 % (ref 39.0–52.0)
Hemoglobin: 13.3 g/dL (ref 13.0–17.0)
Lymphocytes Relative: 14.5 % (ref 12.0–46.0)
Lymphs Abs: 2.1 10*3/uL (ref 0.7–4.0)
MCHC: 32.8 g/dL (ref 30.0–36.0)
MCV: 87.4 fl (ref 78.0–100.0)
Monocytes Absolute: 0.8 10*3/uL (ref 0.1–1.0)
Monocytes Relative: 5.3 % (ref 3.0–12.0)
Neutro Abs: 11.4 10*3/uL — ABNORMAL HIGH (ref 1.4–7.7)
Neutrophils Relative %: 79.6 % — ABNORMAL HIGH (ref 43.0–77.0)
Platelets: 696 10*3/uL — ABNORMAL HIGH (ref 150.0–400.0)
RBC: 4.63 Mil/uL (ref 4.22–5.81)
RDW: 15 % (ref 11.5–15.5)
WBC: 14.4 10*3/uL — ABNORMAL HIGH (ref 4.0–10.5)

## 2020-03-29 LAB — COMPREHENSIVE METABOLIC PANEL
ALT: 14 U/L (ref 0–53)
AST: 14 U/L (ref 0–37)
Albumin: 3.9 g/dL (ref 3.5–5.2)
Alkaline Phosphatase: 69 U/L (ref 39–117)
BUN: 35 mg/dL — ABNORMAL HIGH (ref 6–23)
CO2: 26 mEq/L (ref 19–32)
Calcium: 9.3 mg/dL (ref 8.4–10.5)
Chloride: 101 mEq/L (ref 96–112)
Creatinine, Ser: 1.18 mg/dL (ref 0.40–1.50)
GFR: 58.45 mL/min — ABNORMAL LOW (ref >60.00)
Glucose, Bld: 124 mg/dL — ABNORMAL HIGH (ref 70–99)
Potassium: 5 mEq/L (ref 3.5–5.1)
Sodium: 135 mEq/L (ref 135–145)
Total Bilirubin: 0.5 mg/dL (ref 0.2–1.2)
Total Protein: 6.4 g/dL (ref 6.0–8.3)

## 2020-03-29 LAB — BRAIN NATRIURETIC PEPTIDE: Pro B Natriuretic peptide (BNP): 175 pg/mL — ABNORMAL HIGH (ref 0.0–100.0)

## 2020-03-29 MED ORDER — FUROSEMIDE 20 MG PO TABS
20.0000 mg | ORAL_TABLET | Freq: Two times a day (BID) | ORAL | 0 refills | Status: DC | PRN
Start: 2020-03-29 — End: 2020-04-05

## 2020-03-29 NOTE — Addendum Note (Signed)
Addended by: Trenda Moots on: 9/77/4142 11:13 AM   Modules accepted: Orders

## 2020-03-29 NOTE — Progress Notes (Signed)
[name] is a 86 y.o. male with the following history as recorded in EpicCare:  Patient Active Problem List   Diagnosis Date Noted  . Cellulitis of right leg 02/03/2020  . Left patella fracture 01/19/2020  . Rib pain on left side 09/18/2019  . Arthritis of left knee 05/07/2019  . Pre-operative clearance 04/28/2019  . Acute diastolic CHF (congestive heart failure) ([place]) 08/10/2018  . Leg wound, left 03/19/2018  . Right arm pain 11/11/2017  . Primary osteoarthritis of right foot 08/21/2017  . Syncope 02/27/2017  . Arthritis of right ankle 12/18/2016  . Carpal tunnel syndrome, bilateral 09/04/2016  . Leg swelling 08/16/2016  . Hand weakness 07/12/2016  . Degenerative arthritis of left knee 06/28/2016  . Iron deficiency anemia 03/16/2016  . Arthritis of left acromioclavicular joint 01/03/2016  . Rotator cuff disorder 01/03/2016  . Osteoarthritis of left knee 04/30/2015  . Baker's cyst of knee 04/30/2015  . Esophageal stricture 04/26/2015  . Benign prostatic hyperplasia 02/21/2015  . Constipation 02/21/2015  . Myalgia and myositis 11/18/2014  . Polyarticular arthritis 07/19/2013  . Chronic radicular low back pain 12/15/2012  . History of gout 02/20/2010  . ERECTILE DYSFUNCTION, ORGANIC 02/20/2010  . Hyperlipidemia 10/18/2008  . CAD 08/18/2007  . Essential hypertension 05/09/2007  . ALLERGIC RHINITIS 03/27/2007    Current Outpatient Medications  Medication Sig Dispense Refill  . amLODipine (NORVASC) 10 MG tablet TAKE 1 TABLET BY MOUTH EVERY DAY 90 tablet 1  . aspirin 81 MG chewable tablet Chew 1 tablet (81 mg total) by mouth daily. 21 tablet 0  . carvedilol (COREG) 25 MG tablet TAKE 1 TABLET BY MOUTH 2 TIMES DAILY WITH A MEAL 180 tablet 1  . [facility] MAGNESIUM OXIDE 250 MG TABS TAKE 1 TABLET BY MOUTH EVERY DAY (Patient taking differently: Take 250 mg by mouth daily as needed (constipation). ) 90 tablet 0  . fluticasone (FLONASE) 50 MCG/ACT nasal spray Place 1 spray into both  nostrils daily.     . hydrALAZINE (APRESOLINE) 50 MG tablet TAKE 1 TABLET BY MOUTH TWICE A DAY 180 tablet 1  . hydrocortisone 2.5 % cream Apply 1 application topically daily as needed (itching).     . methocarbamol (ROBAXIN) 500 MG tablet Take 1 tablet (500 mg total) by mouth every 8 (eight) hours as needed for muscle spasms. 30 tablet 0  . Oxycodone HCl 10 MG TABS Take 1 tablet (10 mg total) by mouth every 4 (four) hours as needed (pain score 4-6). (Patient taking differently: Take 10 mg by mouth 4 (four) times daily as needed (pain score 4-6). ) 42 tablet 0  . predniSONE (DELTASONE) 10 MG tablet TAKE 1-4 TABLETS (10-40 MG TOTAL) BY MOUTH DAILY WITH BREAKFAST. 90 tablet 0  . spironolactone (ALDACTONE) 25 MG tablet Take 2 tablets (50 mg total) by mouth daily. 180 tablet 1  . tamsulosin (FLOMAX) 0.4 MG CAPS capsule TAKE 1 CAPSULE BY MOUTH EVERY DAY 90 capsule 1  . triamcinolone cream (KENALOG) 0.5 % APPLY 1 APPLICATION TOPICALLY 2 (TWO) TIMES DAILY AS NEEDED (DRY SKIN, ITCHING). 90 g 0  . doxycycline (VIBRAMYCIN) 100 MG capsule Take 100 mg by mouth 2 (two) times daily. (Patient not taking: Reported on 03/29/2020)    . furosemide (LASIX) 20 MG tablet Take 1 tablet (20 mg total) by mouth 2 (two) times daily as needed for fluid or edema. 30 tablet 0   No current facility-administered medications for this visit.    Allergies: Gabapentin and Lyrica [pregabalin]  Past  Medical History:  Diagnosis Date  . ACTINIC KERATOSIS, HEAD 03/23/2008  . ALLERGIC RHINITIS 03/27/2007  . Arthritis   . BENIGN PROSTATIC HYPERTROPHY, WITH URINARY OBSTRUCTION 10/16/2007  . CAD 08/18/2007   "patient denies any issues with heart, does not see cardiologist"  . Carpal tunnel syndrome, bilateral 09/04/2016  . CARPAL TUNNEL SYNDROME, LEFT 04/13/2008  . Chronic back pain   . Chronic neck pain   . Complication of anesthesia    left lower jaw teeth have been injured in the past and he has lost those, now has 2 loose front teeth due  to a mouth injury on 01/02/20  . DVT (deep venous thrombosis) ([place])   . ERECTILE DYSFUNCTION, ORGANIC 02/20/2010  . Gout, unspecified 02/20/2010  . Headache(784.0)    migraines hx of  . History of melanoma   . HYPERLIPIDEMIA, WITH HIGH HDL 10/18/2008  . HYPERTENSION 05/09/2007   sees Dr. Benay Pillow  . KNEE PAIN 10/10/2009  . LOW BACK PAIN 03/27/2007  . NEOPLASM, MALIGNANT, SKIN, TRUNK 05/09/2007   squamous cell on scalp, sideburn and ear (right)  . Patellar tendinitis 10/10/2009  . Sciatic nerve pain    on Oxycodone as needed  . SKIN CANCER, HX OF 03/27/2007  . UNS ADVRS EFF UNS RX MEDICINAL&BIOLOGICAL SBSTNC 08/18/2007  . Urinary urgency     Past Surgical History:  Procedure Laterality Date  . APPENDECTOMY  1969  . APPLICATION OF WOUND VAC Left 01/19/2020   Procedure: Application Of Wound Vac;  Surgeon: [name], MD;  Location: [facility];  Service: Orthopedics;  Laterality: Left;  . BACK SURGERY    . BLEPHAROPLASTY    . CARPAL TUNNEL RELEASE    . CHOLECYSTECTOMY    . ESOPHAGOGASTRODUODENOSCOPY (EGD) WITH PROPOFOL N/A 04/26/2015   Procedure: ESOPHAGOGASTRODUODENOSCOPY (EGD) WITH PROPOFOL;  Surgeon: Inda Castle, MD;  [facility];  Service: Endoscopy;  Laterality: N/A;  . EYE SURGERY     bilateral cataracts removed and vitrectomies  . JOINT REPLACEMENT Right    knee  . KNEE ARTHROSCOPY    . LAMINECTOMY    . LATERAL FUSION LUMBAR SPINE, TRANSVERSE    . LUMBAR LAMINECTOMY/DECOMPRESSION MICRODISCECTOMY Left 11/03/2012   Procedure: LUMBAR LAMINECTOMY/DECOMPRESSION MICRODISCECTOMY 1 LEVEL;  Surgeon: Hosie Spangle, MD;  Location: [facility];  Service: Neurosurgery;  Laterality: Left;  LEFT L5S1 laminotomy foraminotomy and possible microdiskectomy  . MELANOMA EXCISION    . NASAL SINUS SURGERY    . ORIF PATELLA Left 01/19/2020   Procedure: LEFT OPEN REDUCTION INTERNAL (ORIF) FIXATION PATELLA WITH AUTO GRAFTING USING HAMSTRING REINFORCEMENT;  Surgeon: [name],  MD;  Location: [facility];  Service: Orthopedics;  Laterality: Left;  . POSTERIOR CERVICAL FUSION/FORAMINOTOMY Right 10/01/2012   Procedure: POSTERIOR CERVICAL FUSION/FORAMINOTOMY LEVEL 1;  Surgeon: Hosie Spangle, MD;  Location: [facility];  Service: Neurosurgery;  Laterality: Right;  Right Cervical seven thoracic one Cervical laminectomy/foraminotomy with posterior cervical arthrodesis   . POSTERIOR LUMBAR FUSION N/A 05/23/2013   Procedure: exploration of lumbar wound, explantation of left interbody implant.;  Surgeon: Hosie Spangle, MD;  Location: [facility];  Service: Neurosurgery;  Laterality: N/A;  . RECTAL SURGERY     Fissure  . TOTAL KNEE ARTHROPLASTY Left 05/07/2019   Procedure: LEFT TOTAL KNEE ARTHROPLASTY;  Surgeon: [name], MD;  Location: [facility];  Service: Orthopedics;  Laterality: Left;  Marland Kitchen VITRECTOMY     right and left 2013    Family History  Problem Relation Age of  Onset  . Dementia Mother   . Anemia Father   . Heart disease Father   . Stroke Sister        ICH  . Lung disease Sister     Social History   Tobacco Use  . Smoking status: Former Smoker    Packs/day: 1.00    Years: 33.00    Pack years: 33.00    Quit date: 09/29/1981    Years since quitting: 38.5  . Smokeless tobacco: Never Used  Substance Use Topics  . Alcohol use: Yes    Alcohol/week: 3.0 standard drinks    Types: 3 Standard drinks or equivalent per week    Comment: Social    Subjective:  Bilateral swelling in both lower legs x 5 weeks; had left knee replacement in early June; orthopedist has evaluated earlier this month and did not comment on the swelling; per patient, swelling does improve some overnight and worsens throughout day; problem list indicates a history of diastolic CHF- cannot see that he sees cardiology regularly;      Objective:  Vitals:   03/29/20 1001  BP: 106/68  Pulse: 81  Temp: 98.2 F (36.8 C)  TempSrc: Oral  SpO2: 95%  Weight: 203 lb (92.1 kg)  Height: 6'  (1.829 m)    General: Well developed, well nourished, in no acute distress  Skin : Warm and dry.  Head: Normocephalic and atraumatic  Lungs: Respirations unlabored; clear to auscultation bilaterally without wheeze, rales, rhonchi  CVS exam: normal rate and regular rhythm.  Musculoskeletal: No deformities; swelling noted over left knee Extremities: bilateral pedal edema, no cyanosis, no clubbing  Vessels: Symmetric bilaterally  Neurologic: Alert and oriented; speech intact; face symmetrical; moves all extremities well; CNII-XII intact without focal deficit   Assessment:  1. Pedal edema     Plan:  ? Etiology; may need to consider doppler with history recent orthopedic surgery earlier this year; will update labs and CXR; trial of Lasix 20 mg bid as directed; follow-up to be determined;  This visit occurred during the SARS-CoV-2 public health emergency.  Safety protocols were in place, including screening questions prior to the visit, additional usage of staff PPE, and extensive cleaning of exam room while observing appropriate contact time as indicated for disinfecting solutions.     No follow-ups on file.  Orders Placed This Encounter  Procedures  . DG Chest 2 View    Standing Status:   Future    Standing Expiration Date:   03/29/2021    Order Specific Question:   Reason for Exam (SYMPTOM  OR DIAGNOSIS REQUIRED)    Answer:   pedal edema    Order Specific Question:   Preferred imaging location?    Answer:   [facility]    Order Specific Question:   Radiology Contrast Protocol - do NOT remove file path    Answer:   [email or web address]  . B Nat Peptide    Standing Status:   Future    Standing Expiration Date:   03/29/2021  . CBC with Differential/Platelet    Standing Status:   Future    Standing Expiration Date:   03/29/2021  . Comp Met (CMET)    Standing Status:   Future    Standing Expiration Date:   03/29/2021  . D-Dimer, Quantitative     Standing Status:   Future    Standing Expiration Date:   03/29/2021    Requested Prescriptions   Signed Prescriptions Disp Refills  . furosemide (LASIX) 20 MG  tablet 30 tablet 0    Sig: Take 1 tablet (20 mg total) by mouth 2 (two) times daily as needed for fluid or edema.

## 2020-03-30 ENCOUNTER — Other Ambulatory Visit: Payer: Self-pay | Admitting: Family

## 2020-03-30 ENCOUNTER — Ambulatory Visit (HOSPITAL_COMMUNITY)
Admission: RE | Admit: 2020-03-30 | Discharge: 2020-03-30 | Disposition: A | Discharge status: Home / Self Care | Payer: Medicare Other | Source: Ambulatory Visit | Attending: Family | Admitting: Family

## 2020-03-30 DIAGNOSIS — R600 Localized edema: Secondary | ICD-10-CM

## 2020-03-30 DIAGNOSIS — R7989 Other specified abnormal findings of blood chemistry: Secondary | ICD-10-CM | POA: Diagnosis not present

## 2020-03-30 LAB — D-DIMER, QUANTITATIVE: D-Dimer, Quant: 3.63 mcg/mL FEU — ABNORMAL HIGH (ref <0.50)

## 2020-03-30 NOTE — Progress Notes (Signed)
Lower venous duplex completed.   June Leap, BS, RDMS, RVT

## 2020-04-01 ENCOUNTER — Other Ambulatory Visit: Payer: Self-pay | Admitting: Internal Medicine

## 2020-04-01 DIAGNOSIS — Z85828 Personal history of other malignant neoplasm of skin: Secondary | ICD-10-CM | POA: Diagnosis not present

## 2020-04-01 DIAGNOSIS — L570 Actinic keratosis: Secondary | ICD-10-CM | POA: Diagnosis not present

## 2020-04-05 ENCOUNTER — Other Ambulatory Visit: Payer: Self-pay | Admitting: Family

## 2020-04-08 ENCOUNTER — Ambulatory Visit (INDEPENDENT_AMBULATORY_CARE_PROVIDER_SITE_OTHER): Payer: Medicare Other | Admitting: Family

## 2020-04-08 ENCOUNTER — Other Ambulatory Visit: Payer: Self-pay

## 2020-04-08 ENCOUNTER — Ambulatory Visit (INDEPENDENT_AMBULATORY_CARE_PROVIDER_SITE_OTHER): Payer: Medicare Other

## 2020-04-08 VITALS — BP 142/70 | HR 88 | Temp 98.8°F | Ht 72.0 in

## 2020-04-08 DIAGNOSIS — R110 Nausea: Secondary | ICD-10-CM | POA: Diagnosis not present

## 2020-04-08 DIAGNOSIS — M549 Dorsalgia, unspecified: Secondary | ICD-10-CM

## 2020-04-08 DIAGNOSIS — M545 Low back pain: Secondary | ICD-10-CM | POA: Diagnosis not present

## 2020-04-08 DIAGNOSIS — M546 Pain in thoracic spine: Secondary | ICD-10-CM | POA: Diagnosis not present

## 2020-04-08 MED ORDER — ONDANSETRON 8 MG PO TBDP
8.0000 mg | ORAL_TABLET | Freq: Three times a day (TID) | ORAL | 0 refills | Status: DC | PRN
Start: 2020-04-08 — End: 2020-04-29

## 2020-04-08 MED ORDER — METHYLPREDNISOLONE ACETATE 40 MG/ML IJ SUSP
40.0000 mg | Freq: Once | INTRAMUSCULAR | Status: AC
  Administered 2020-04-08: 40 mg via INTRAMUSCULAR

## 2020-04-08 MED ORDER — KETOROLAC TROMETHAMINE 30 MG/ML IJ SOLN
30.0000 mg | Freq: Once | INTRAMUSCULAR | Status: AC
  Administered 2020-04-08: 30 mg via INTRAMUSCULAR

## 2020-04-08 NOTE — Progress Notes (Signed)
[name] is a 86 y.o. male with the following history as recorded in EpicCare:  Patient Active Problem List   Diagnosis Date Noted  . Cellulitis of right leg 02/03/2020  . Left patella fracture 01/19/2020  . Rib pain on left side 09/18/2019  . Arthritis of left knee 05/07/2019  . Pre-operative clearance 04/28/2019  . Acute diastolic CHF (congestive heart failure) ([place]) 08/10/2018  . Leg wound, left 03/19/2018  . Right arm pain 11/11/2017  . Primary osteoarthritis of right foot 08/21/2017  . Syncope 02/27/2017  . Arthritis of right ankle 12/18/2016  . Carpal tunnel syndrome, bilateral 09/04/2016  . Leg swelling 08/16/2016  . Hand weakness 07/12/2016  . Degenerative arthritis of left knee 06/28/2016  . Iron deficiency anemia 03/16/2016  . Arthritis of left acromioclavicular joint 01/03/2016  . Rotator cuff disorder 01/03/2016  . Osteoarthritis of left knee 04/30/2015  . Baker's cyst of knee 04/30/2015  . Esophageal stricture 04/26/2015  . Benign prostatic hyperplasia 02/21/2015  . Constipation 02/21/2015  . Myalgia and myositis 11/18/2014  . Polyarticular arthritis 07/19/2013  . Chronic radicular low back pain 12/15/2012  . History of gout 02/20/2010  . ERECTILE DYSFUNCTION, ORGANIC 02/20/2010  . Hyperlipidemia 10/18/2008  . CAD 08/18/2007  . Essential hypertension 05/09/2007  . ALLERGIC RHINITIS 03/27/2007    Current Outpatient Medications  Medication Sig Dispense Refill  . amLODipine (NORVASC) 10 MG tablet TAKE 1 TABLET BY MOUTH EVERY DAY 90 tablet 1  . aspirin 81 MG chewable tablet Chew 1 tablet (81 mg total) by mouth daily. 21 tablet 0  . carvedilol (COREG) 25 MG tablet TAKE 1 TABLET BY MOUTH 2 TIMES DAILY WITH A MEAL 180 tablet 1  . [facility] MAGNESIUM OXIDE 250 MG TABS TAKE 1 TABLET BY MOUTH EVERY DAY (Patient taking differently: Take 250 mg by mouth daily as needed (constipation). ) 90 tablet 0  . fluticasone (FLONASE) 50 MCG/ACT nasal spray Place 1 spray into both  nostrils daily.     . furosemide (LASIX) 20 MG tablet TAKE 1 TABLET (20 MG TOTAL) BY MOUTH 2 (TWO) TIMES DAILY AS NEEDED FOR FLUID OR EDEMA. 30 tablet 0  . hydrALAZINE (APRESOLINE) 50 MG tablet TAKE 1 TABLET BY MOUTH TWICE A DAY 180 tablet 1  . hydrocortisone 2.5 % cream Apply 1 application topically daily as needed (itching).     . methocarbamol (ROBAXIN) 500 MG tablet Take 1 tablet (500 mg total) by mouth every 8 (eight) hours as needed for muscle spasms. 30 tablet 0  . Oxycodone HCl 10 MG TABS Take 1 tablet (10 mg total) by mouth every 4 (four) hours as needed (pain score 4-6). (Patient taking differently: Take 10 mg by mouth 4 (four) times daily as needed (pain score 4-6). ) 42 tablet 0  . predniSONE (DELTASONE) 10 MG tablet TAKE 1-4 TABLETS (10-40 MG TOTAL) BY MOUTH DAILY WITH BREAKFAST. 90 tablet 0  . spironolactone (ALDACTONE) 25 MG tablet TAKE 1 TABLET BY MOUTH EVERY DAY 90 tablet 1  . tamsulosin (FLOMAX) 0.4 MG CAPS capsule TAKE 1 CAPSULE BY MOUTH EVERY DAY 90 capsule 1  . triamcinolone cream (KENALOG) 0.5 % APPLY 1 APPLICATION TOPICALLY 2 (TWO) TIMES DAILY AS NEEDED (DRY SKIN, ITCHING). 90 g 0  . ondansetron (ZOFRAN ODT) 8 MG disintegrating tablet Take 1 tablet (8 mg total) by mouth every 8 (eight) hours as needed for nausea or vomiting. 20 tablet 0   No current facility-administered medications for this visit.    Allergies: Gabapentin and  Lyrica [pregabalin]  Past Medical History:  Diagnosis Date  . ACTINIC KERATOSIS, HEAD 03/23/2008  . ALLERGIC RHINITIS 03/27/2007  . Arthritis   . BENIGN PROSTATIC HYPERTROPHY, WITH URINARY OBSTRUCTION 10/16/2007  . CAD 08/18/2007   "patient denies any issues with heart, does not see cardiologist"  . Carpal tunnel syndrome, bilateral 09/04/2016  . CARPAL TUNNEL SYNDROME, LEFT 04/13/2008  . Chronic back pain   . Chronic neck pain   . Complication of anesthesia    left lower jaw teeth have been injured in the past and he has lost those, now has 2 loose  front teeth due to a mouth injury on 01/02/20  . DVT (deep venous thrombosis) ([place])   . ERECTILE DYSFUNCTION, ORGANIC 02/20/2010  . Gout, unspecified 02/20/2010  . Headache(784.0)    migraines hx of  . History of melanoma   . HYPERLIPIDEMIA, WITH HIGH HDL 10/18/2008  . HYPERTENSION 05/09/2007   sees Dr. Benay Pillow  . KNEE PAIN 10/10/2009  . LOW BACK PAIN 03/27/2007  . NEOPLASM, MALIGNANT, SKIN, TRUNK 05/09/2007   squamous cell on scalp, sideburn and ear (right)  . Patellar tendinitis 10/10/2009  . Sciatic nerve pain    on Oxycodone as needed  . SKIN CANCER, HX OF 03/27/2007  . UNS ADVRS EFF UNS RX MEDICINAL&BIOLOGICAL SBSTNC 08/18/2007  . Urinary urgency     Past Surgical History:  Procedure Laterality Date  . APPENDECTOMY  1969  . APPLICATION OF WOUND VAC Left 01/19/2020   Procedure: Application Of Wound Vac;  Surgeon: [name], MD;  Location: [facility];  Service: Orthopedics;  Laterality: Left;  . BACK SURGERY    . BLEPHAROPLASTY    . CARPAL TUNNEL RELEASE    . CHOLECYSTECTOMY    . ESOPHAGOGASTRODUODENOSCOPY (EGD) WITH PROPOFOL N/A 04/26/2015   Procedure: ESOPHAGOGASTRODUODENOSCOPY (EGD) WITH PROPOFOL;  Surgeon: Inda Castle, MD;  [facility];  Service: Endoscopy;  Laterality: N/A;  . EYE SURGERY     bilateral cataracts removed and vitrectomies  . JOINT REPLACEMENT Right    knee  . KNEE ARTHROSCOPY    . LAMINECTOMY    . LATERAL FUSION LUMBAR SPINE, TRANSVERSE    . LUMBAR LAMINECTOMY/DECOMPRESSION MICRODISCECTOMY Left 11/03/2012   Procedure: LUMBAR LAMINECTOMY/DECOMPRESSION MICRODISCECTOMY 1 LEVEL;  Surgeon: Hosie Spangle, MD;  Location: [facility];  Service: Neurosurgery;  Laterality: Left;  LEFT L5S1 laminotomy foraminotomy and possible microdiskectomy  . MELANOMA EXCISION    . NASAL SINUS SURGERY    . ORIF PATELLA Left 01/19/2020   Procedure: LEFT OPEN REDUCTION INTERNAL (ORIF) FIXATION PATELLA WITH AUTO GRAFTING USING HAMSTRING REINFORCEMENT;  Surgeon: [name], MD;  Location: [facility];  Service: Orthopedics;  Laterality: Left;  . POSTERIOR CERVICAL FUSION/FORAMINOTOMY Right 10/01/2012   Procedure: POSTERIOR CERVICAL FUSION/FORAMINOTOMY LEVEL 1;  Surgeon: Hosie Spangle, MD;  Location: [facility];  Service: Neurosurgery;  Laterality: Right;  Right Cervical seven thoracic one Cervical laminectomy/foraminotomy with posterior cervical arthrodesis   . POSTERIOR LUMBAR FUSION N/A 05/23/2013   Procedure: exploration of lumbar wound, explantation of left interbody implant.;  Surgeon: Hosie Spangle, MD;  Location: [facility];  Service: Neurosurgery;  Laterality: N/A;  . RECTAL SURGERY     Fissure  . TOTAL KNEE ARTHROPLASTY Left 05/07/2019   Procedure: LEFT TOTAL KNEE ARTHROPLASTY;  Surgeon: [name], MD;  Location: [facility];  Service: Orthopedics;  Laterality: Left;  Marland Kitchen VITRECTOMY     right and left 2013    Family History  Problem Relation Age of Onset  . Dementia Mother   . Anemia Father   . Heart disease Father   . Stroke Sister        ICH  . Lung disease Sister     Social History   Tobacco Use  . Smoking status: Former Smoker    Packs/day: 1.00    Years: 33.00    Pack years: 33.00    Quit date: 09/29/1981    Years since quitting: 38.5  . Smokeless tobacco: Never Used  Substance Use Topics  . Alcohol use: Yes    Alcohol/week: 3.0 standard drinks    Types: 3 Standard drinks or equivalent per week    Comment: Social    Subjective:  Patient slipped while getting into his truck and landed "flate" on his back; injury occurred 5 days ago; history of chronic back issues; would like to get his back xray to make sure no fracture; does not take Robaxin regularly- does not feel like it is beneficial; Notes that pain does seem to be worsening as the week progresses; now having some nausea especially at night- has actually caused him to throw up occasionally.     Objective:  Vitals:   04/08/20 0922  BP: (!) 142/70  Pulse: 88   Temp: 98.8 F (37.1 C)  SpO2: 97%  Height: 6' (1.829 m)    General: Well developed, well nourished, in no acute distress  Skin : Warm and dry.  Head: Normocephalic and atraumatic  Eyes: Sclera and conjunctiva clear; pupils round and reactive to light; extraocular movements intact  Lungs: Respirations unlabored; clear to auscultation bilaterally without wheeze, rales, rhonchi  Musculoskeletal: No deformities; no active joint inflammation  Extremities: No edema, cyanosis, clubbing  Vessels: Symmetric bilaterally  Neurologic: Alert and oriented; speech intact; face symmetrical; in wheelchair;  Assessment:  1. Acute back pain, unspecified back location, unspecified back pain laterality   2. Nausea     Plan:  Update X-ray to make sure no acute fractures; Toradol IM and Depo-Medrol IM 40 mg given today; he will start 40 mg of Prednisone tomorrow x 5 days and then decrease to 20 mg daily; Check CBC, CMP today; Rx for Zofran- suspect pain is causing nausea;  Follow-up to be determined;  This visit occurred during the SARS-CoV-2 public health emergency.  Safety protocols were in place, including screening questions prior to the visit, additional usage of staff PPE, and extensive cleaning of exam room while observing appropriate contact time as indicated for disinfecting solutions.     No follow-ups on file.  Orders Placed This Encounter  Procedures  . DG Thoracic Spine 2 View    Standing Status:   Future    Number of Occurrences:   1    Standing Expiration Date:   04/08/2021    Order Specific Question:   Reason for Exam (SYMPTOM  OR DIAGNOSIS REQUIRED)    Answer:   fall/ rule out fracture    Order Specific Question:   Preferred imaging location?    Answer:   [facility]    Order Specific Question:   Radiology Contrast Protocol - do NOT remove file path    Answer:   _[email or web address]  . DG Lumbar Spine Complete     Standing Status:   Future    Number of Occurrences:   1    Standing Expiration Date:   04/08/2021    Order Specific Question:   Reason for Exam (SYMPTOM  OR DIAGNOSIS REQUIRED)  Answer:   fall/ rule out fracture    Order Specific Question:   Preferred imaging location?    Answer:   [facility]    Order Specific Question:   Radiology Contrast Protocol - do NOT remove file path    Answer:   _[email or web address]  . CBC with Differential/Platelet    Standing Status:   Future    Number of Occurrences:   1    Standing Expiration Date:   04/08/2021  . Comp Met (CMET)    Standing Status:   Future    Number of Occurrences:   1    Standing Expiration Date:   04/08/2021    Requested Prescriptions   Signed Prescriptions Disp Refills  . ondansetron (ZOFRAN ODT) 8 MG disintegrating tablet 20 tablet 0    Sig: Take 1 tablet (8 mg total) by mouth every 8 (eight) hours as needed for nausea or vomiting.

## 2020-04-08 NOTE — Patient Instructions (Signed)
You are getting a steroid shot and Toradol to help with the pain; please get the X-rays and labs as we discussed. I have sent in something to help with the nausea.  Please start taking 40 mg of Prednisone tomorrow; take for 5 days and then go back to 20 mg daily;

## 2020-04-09 ENCOUNTER — Other Ambulatory Visit: Payer: Self-pay | Admitting: Internal Medicine

## 2020-04-09 LAB — CBC WITH DIFFERENTIAL/PLATELET
Absolute Monocytes: 1260 cells/uL — ABNORMAL HIGH (ref 200–950)
Basophils Absolute: 38 cells/uL (ref 0–200)
Basophils Relative: 0.3 %
Eosinophils Absolute: 25 cells/uL (ref 15–500)
Eosinophils Relative: 0.2 %
HCT: 41.1 % (ref 38.5–50.0)
Hemoglobin: 13.2 g/dL (ref 13.2–17.1)
Lymphs Abs: 2381 cells/uL (ref 850–3900)
MCH: 28.8 pg (ref 27.0–33.0)
MCHC: 32.1 g/dL (ref 32.0–36.0)
MCV: 89.5 fL (ref 80.0–100.0)
MPV: 9.4 fL (ref 7.5–12.5)
Monocytes Relative: 10 %
Neutro Abs: 8896 cells/uL — ABNORMAL HIGH (ref 1500–7800)
Neutrophils Relative %: 70.6 %
Platelets: 401 10*3/uL — ABNORMAL HIGH (ref 140–400)
RBC: 4.59 10*6/uL (ref 4.20–5.80)
RDW: 13.6 % (ref 11.0–15.0)
Total Lymphocyte: 18.9 %
WBC: 12.6 10*3/uL — ABNORMAL HIGH (ref 3.8–10.8)

## 2020-04-09 LAB — COMPREHENSIVE METABOLIC PANEL
AG Ratio: 1.8 (calc) (ref 1.0–2.5)
ALT: 16 U/L (ref 9–46)
AST: 14 U/L (ref 10–35)
Albumin: 4 g/dL (ref 3.6–5.1)
Alkaline phosphatase (APISO): 65 U/L (ref 35–144)
BUN/Creatinine Ratio: 28 (calc) — ABNORMAL HIGH (ref 6–22)
BUN: 33 mg/dL — ABNORMAL HIGH (ref 7–25)
CO2: 31 mmol/L (ref 20–32)
Calcium: 9.3 mg/dL (ref 8.6–10.3)
Chloride: 97 mmol/L — ABNORMAL LOW (ref 98–110)
Creat: 1.17 mg/dL — ABNORMAL HIGH (ref 0.70–1.11)
Globulin: 2.2 g/dL (calc) (ref 1.9–3.7)
Glucose, Bld: 106 mg/dL — ABNORMAL HIGH (ref 65–99)
Potassium: 4.3 mmol/L (ref 3.5–5.3)
Sodium: 136 mmol/L (ref 135–146)
Total Bilirubin: 0.9 mg/dL (ref 0.2–1.2)
Total Protein: 6.2 g/dL (ref 6.1–8.1)

## 2020-04-13 ENCOUNTER — Telehealth: Payer: Self-pay | Admitting: Internal Medicine

## 2020-04-13 NOTE — Telephone Encounter (Signed)
New message:   Pt is calling and states he would like a phone call from [place]. He states he has a question and some information to give her. Please advise.

## 2020-04-14 NOTE — Telephone Encounter (Signed)
Spoke with patient on yesterday. 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 He said he had a few things he wanted to run by you but would not provide specific details.He is fine with call back on Friday as he states it's not urgent.

## 2020-04-15 MED ORDER — POLYETHYLENE GLYCOL 3350 17 GM/SCOOP PO POWD
17.0000 g | Freq: Two times a day (BID) | ORAL | 1 refills | Status: DC | PRN
Start: 2020-04-15 — End: 2020-06-10

## 2020-04-15 NOTE — Telephone Encounter (Signed)
Patient is requesting a call back, He state it is a follow up. He did not want to give any more information.

## 2020-04-15 NOTE — Telephone Encounter (Signed)
Patient had increased constipation with increased dosage of narcotic for recent acute back pain; does not want to pay for another RX; recommended trial of daily Miralax and follow-up if symptoms persist.  He notes he has been able to go to the bathroom is not concerned for bowel obstruction.

## 2020-04-16 ENCOUNTER — Other Ambulatory Visit: Payer: Self-pay | Admitting: Family

## 2020-04-25 ENCOUNTER — Other Ambulatory Visit: Payer: Self-pay

## 2020-04-25 ENCOUNTER — Ambulatory Visit (INDEPENDENT_AMBULATORY_CARE_PROVIDER_SITE_OTHER): Payer: Medicare Other | Admitting: Family

## 2020-04-25 VITALS — BP 145/78 | HR 82 | Temp 98.6°F | Ht 72.0 in | Wt 192.6 lb

## 2020-04-25 DIAGNOSIS — K56699 Other intestinal obstruction unspecified as to partial versus complete obstruction: Secondary | ICD-10-CM | POA: Diagnosis not present

## 2020-04-25 DIAGNOSIS — R109 Unspecified abdominal pain: Secondary | ICD-10-CM

## 2020-04-25 NOTE — Progress Notes (Signed)
[name] is a 86 y.o. male with the following history as recorded in EpicCare:  Patient Active Problem List   Diagnosis Date Noted  . Cellulitis of right leg 02/03/2020  . Left patella fracture 01/19/2020  . Rib pain on left side 09/18/2019  . Arthritis of left knee 05/07/2019  . Pre-operative clearance 04/28/2019  . Acute diastolic CHF (congestive heart failure) ([place]) 08/10/2018  . Leg wound, left 03/19/2018  . Right arm pain 11/11/2017  . Primary osteoarthritis of right foot 08/21/2017  . Syncope 02/27/2017  . Arthritis of right ankle 12/18/2016  . Carpal tunnel syndrome, bilateral 09/04/2016  . Leg swelling 08/16/2016  . Hand weakness 07/12/2016  . Degenerative arthritis of left knee 06/28/2016  . Iron deficiency anemia 03/16/2016  . Arthritis of left acromioclavicular joint 01/03/2016  . Rotator cuff disorder 01/03/2016  . Osteoarthritis of left knee 04/30/2015  . Baker's cyst of knee 04/30/2015  . Esophageal stricture 04/26/2015  . Benign prostatic hyperplasia 02/21/2015  . Constipation 02/21/2015  . Myalgia and myositis 11/18/2014  . Polyarticular arthritis 07/19/2013  . Chronic radicular low back pain 12/15/2012  . History of gout 02/20/2010  . ERECTILE DYSFUNCTION, ORGANIC 02/20/2010  . Hyperlipidemia 10/18/2008  . CAD 08/18/2007  . Essential hypertension 05/09/2007  . ALLERGIC RHINITIS 03/27/2007    Current Outpatient Medications  Medication Sig Dispense Refill  . amLODipine (NORVASC) 10 MG tablet TAKE 1 TABLET BY MOUTH EVERY DAY 90 tablet 1  . carvedilol (COREG) 25 MG tablet TAKE 1 TABLET BY MOUTH 2 TIMES DAILY WITH A MEAL 180 tablet 1  . fluticasone (FLONASE) 50 MCG/ACT nasal spray Place 1 spray into both nostrils daily.     . furosemide (LASIX) 20 MG tablet TAKE 1 TABLET (20 MG TOTAL) BY MOUTH 2 (TWO) TIMES DAILY AS NEEDED FOR FLUID OR EDEMA. 30 tablet 0  . hydrALAZINE (APRESOLINE) 50 MG tablet TAKE 1 TABLET BY MOUTH TWICE A DAY 180 tablet 1  .  Oxycodone HCl 10 MG TABS Take 1 tablet (10 mg total) by mouth every 4 (four) hours as needed (pain score 4-6). (Patient taking differently: Take 10 mg by mouth 4 (four) times daily as needed (pain score 4-6). ) 42 tablet 0  . polyethylene glycol powder (GLYCOLAX/MIRALAX) 17 GM/SCOOP powder Take 17 g by mouth 2 (two) times daily as needed. 3350 g 1  . predniSONE (DELTASONE) 10 MG tablet TAKE 1-4 TABLETS (10-40 MG TOTAL) BY MOUTH DAILY WITH BREAKFAST. 90 tablet 0  . spironolactone (ALDACTONE) 25 MG tablet TAKE 1 TABLET BY MOUTH EVERY DAY 90 tablet 1  . tamsulosin (FLOMAX) 0.4 MG CAPS capsule TAKE 1 CAPSULE BY MOUTH EVERY DAY 90 capsule 1  . aspirin 81 MG chewable tablet Chew 1 tablet (81 mg total) by mouth daily. (Patient not taking: Reported on 04/25/2020) 21 tablet 0  . [facility]S MAGNESIUM OXIDE 250 MG TABS TAKE 1 TABLET BY MOUTH EVERY DAY (Patient not taking: Reported on 04/25/2020) 90 tablet 0  . hydrocortisone 2.5 % cream Apply 1 application topically daily as needed (itching).  (Patient not taking: Reported on 04/25/2020)    . methocarbamol (ROBAXIN) 500 MG tablet Take 1 tablet (500 mg total) by mouth every 8 (eight) hours as needed for muscle spasms. (Patient not taking: Reported on 04/25/2020) 30 tablet 0  . ondansetron (ZOFRAN ODT) 8 MG disintegrating tablet Take 1 tablet (8 mg total) by mouth every 8 (eight) hours as needed for nausea or vomiting. (Patient not taking: Reported on 04/25/2020) 20  tablet 0  . triamcinolone cream (KENALOG) 0.5 % APPLY 1 APPLICATION TOPICALLY 2 (TWO) TIMES DAILY AS NEEDED (DRY SKIN, ITCHING). (Patient not taking: Reported on 04/25/2020) 90 g 0   No current facility-administered medications for this visit.    Allergies: Gabapentin and Lyrica [pregabalin]  Past Medical History:  Diagnosis Date  . ACTINIC KERATOSIS, HEAD 03/23/2008  . ALLERGIC RHINITIS 03/27/2007  . Arthritis   . BENIGN PROSTATIC HYPERTROPHY, WITH URINARY OBSTRUCTION 10/16/2007  . CAD 08/18/2007   "patient  denies any issues with heart, does not see cardiologist"  . Carpal tunnel syndrome, bilateral 09/04/2016  . CARPAL TUNNEL SYNDROME, LEFT 04/13/2008  . Chronic back pain   . Chronic neck pain   . Complication of anesthesia    left lower jaw teeth have been injured in the past and he has lost those, now has 2 loose front teeth due to a mouth injury on 01/02/20  . DVT (deep venous thrombosis) ([place])   . ERECTILE DYSFUNCTION, ORGANIC 02/20/2010  . Gout, unspecified 02/20/2010  . Headache(784.0)    migraines hx of  . History of melanoma   . HYPERLIPIDEMIA, WITH HIGH HDL 10/18/2008  . HYPERTENSION 05/09/2007   sees Dr. Benay Pillow  . KNEE PAIN 10/10/2009  . LOW BACK PAIN 03/27/2007  . NEOPLASM, MALIGNANT, SKIN, TRUNK 05/09/2007   squamous cell on scalp, sideburn and ear (right)  . Patellar tendinitis 10/10/2009  . Sciatic nerve pain    on Oxycodone as needed  . SKIN CANCER, HX OF 03/27/2007  . UNS ADVRS EFF UNS RX MEDICINAL&BIOLOGICAL SBSTNC 08/18/2007  . Urinary urgency     Past Surgical History:  Procedure Laterality Date  . APPENDECTOMY  1969  . APPLICATION OF WOUND VAC Left 01/19/2020   Procedure: Application Of Wound Vac;  Surgeon: [name], MD;  Location: [facility];  Service: Orthopedics;  Laterality: Left;  . BACK SURGERY    . BLEPHAROPLASTY    . CARPAL TUNNEL RELEASE    . CHOLECYSTECTOMY    . ESOPHAGOGASTRODUODENOSCOPY (EGD) WITH PROPOFOL N/A 04/26/2015   Procedure: ESOPHAGOGASTRODUODENOSCOPY (EGD) WITH PROPOFOL;  Surgeon: Inda Castle, MD;  [facility];  Service: Endoscopy;  Laterality: N/A;  . EYE SURGERY     bilateral cataracts removed and vitrectomies  . JOINT REPLACEMENT Right    knee  . KNEE ARTHROSCOPY    . LAMINECTOMY    . LATERAL FUSION LUMBAR SPINE, TRANSVERSE    . LUMBAR LAMINECTOMY/DECOMPRESSION MICRODISCECTOMY Left 11/03/2012   Procedure: LUMBAR LAMINECTOMY/DECOMPRESSION MICRODISCECTOMY 1 LEVEL;  Surgeon: Hosie Spangle, MD;  Location: [facility];   Service: Neurosurgery;  Laterality: Left;  LEFT L5S1 laminotomy foraminotomy and possible microdiskectomy  . MELANOMA EXCISION    . NASAL SINUS SURGERY    . ORIF PATELLA Left 01/19/2020   Procedure: LEFT OPEN REDUCTION INTERNAL (ORIF) FIXATION PATELLA WITH AUTO GRAFTING USING HAMSTRING REINFORCEMENT;  Surgeon: [name], MD;  Location: [facility];  Service: Orthopedics;  Laterality: Left;  . POSTERIOR CERVICAL FUSION/FORAMINOTOMY Right 10/01/2012   Procedure: POSTERIOR CERVICAL FUSION/FORAMINOTOMY LEVEL 1;  Surgeon: Hosie Spangle, MD;  Location: [facility];  Service: Neurosurgery;  Laterality: Right;  Right Cervical seven thoracic one Cervical laminectomy/foraminotomy with posterior cervical arthrodesis   . POSTERIOR LUMBAR FUSION N/A 05/23/2013   Procedure: exploration of lumbar wound, explantation of left interbody implant.;  Surgeon: Hosie Spangle, MD;  Location: [facility];  Service: Neurosurgery;  Laterality: N/A;  . RECTAL SURGERY     Fissure  .  TOTAL KNEE ARTHROPLASTY Left 05/07/2019   Procedure: LEFT TOTAL KNEE ARTHROPLASTY;  Surgeon: [name], MD;  Location: [facility];  Service: Orthopedics;  Laterality: Left;  Marland Kitchen VITRECTOMY     right and left 2013    Family History  Problem Relation Age of Onset  . Dementia Mother   . Anemia Father   . Heart disease Father   . Stroke Sister        ICH  . Lung disease Sister     Social History   Tobacco Use  . Smoking status: Former Smoker    Packs/day: 1.00    Years: 33.00    Pack years: 33.00    Quit date: 09/29/1981    Years since quitting: 38.5  . Smokeless tobacco: Never Used  Substance Use Topics  . Alcohol use: Yes    Alcohol/week: 3.0 standard drinks    Types: 3 Standard drinks or equivalent per week    Comment: Social    Subjective:   Patient had called 2 weeks ago with concerns for constipation secondary to increased Oxycodone usage. He was having bowel movements at the time and  Was asked to try Miralax and  follow-up if symptoms persist. He notes the symptoms have continued to worsen- he used Milk of Magnesia 2 days ago and woke up in the middle of the night with an accident with liquid stool; he denies any blood in his stool/ nausea or vomiting;   Objective:  Vitals:   04/25/20 1549  BP: (!) 145/78  Pulse: 82  Temp: 98.6 F (37 C)  TempSrc: Oral  SpO2: 98%  Weight: 192 lb 9.6 oz (87.4 kg)  Height: 6' (1.829 m)    General: Well developed, well nourished, in no acute distress  Skin : Warm and dry.  Head: Normocephalic and atraumatic  Lungs: Respirations unlabored; clear to auscultation bilaterally without wheeze, rales, rhonchi  CVS exam: normal rate and regular rhythm.  Abdomen: Firm, distended;   Neurologic: Alert and oriented; speech intact; face symmetrical; moves all extremities well; CNII-XII intact without focal deficit   Assessment:  1. Abdominal pain, unspecified abdominal location   2. Other specified intestinal obstruction, unspecified whether partial or complete ([place])     Plan:  Concern for bowel obstruction; unable to get any imaging today and patient does not want to go to ER today; check CBC, CMP and STAT abd/ pevlic CT for tomorrow; follow-up to be determined;  This visit occurred during the SARS-CoV-2 public health emergency.  Safety protocols were in place, including screening questions prior to the visit, additional usage of staff PPE, and extensive cleaning of exam room while observing appropriate contact time as indicated for disinfecting solutions.     No follow-ups on file.  Orders Placed This Encounter  Procedures  . CT Abdomen Pelvis W Contrast    Standing Status:   Future    Standing Expiration Date:   04/25/2021    Order Specific Question:   If indicated for the ordered procedure, I authorize the administration of contrast media per Radiology protocol    Answer:   Yes    Order Specific Question:   Preferred imaging location?    Answer:   [facility]  [facility]    Order Specific Question:   Is Oral Contrast requested for this exam?    Answer:   Yes, Per Radiology protocol    Order Specific Question:   Radiology Contrast Protocol - do NOT remove file path    Answer:   [email or web address]  . CBC with Differential/Platelet    Standing Status:   Future    Number of Occurrences:   1    Standing Expiration Date:   04/25/2021  . Comp Met (CMET)    Standing Status:   Future    Number of Occurrences:   1    Standing Expiration Date:   04/25/2021    Requested Prescriptions    No prescriptions requested or ordered in this encounter

## 2020-04-26 ENCOUNTER — Ambulatory Visit (HOSPITAL_BASED_OUTPATIENT_CLINIC_OR_DEPARTMENT_OTHER): Payer: Medicare Other

## 2020-04-26 ENCOUNTER — Telehealth: Payer: Self-pay | Admitting: Internal Medicine

## 2020-04-26 LAB — CBC WITH DIFFERENTIAL/PLATELET
Absolute Monocytes: 179 cells/uL — ABNORMAL LOW (ref 200–950)
Basophils Absolute: 9 cells/uL (ref 0–200)
Basophils Relative: 0.1 %
Eosinophils Absolute: 0 cells/uL — ABNORMAL LOW (ref 15–500)
Eosinophils Relative: 0 %
HCT: 42.2 % (ref 38.5–50.0)
Hemoglobin: 13.7 g/dL (ref 13.2–17.1)
Lymphs Abs: 876 cells/uL (ref 850–3900)
MCH: 28.6 pg (ref 27.0–33.0)
MCHC: 32.5 g/dL (ref 32.0–36.0)
MCV: 88.1 fL (ref 80.0–100.0)
MPV: 9.1 fL (ref 7.5–12.5)
Monocytes Relative: 2.1 %
Neutro Abs: 7438 cells/uL (ref 1500–7800)
Neutrophils Relative %: 87.5 %
Platelets: 430 10*3/uL — ABNORMAL HIGH (ref 140–400)
RBC: 4.79 10*6/uL (ref 4.20–5.80)
RDW: 13.7 % (ref 11.0–15.0)
Total Lymphocyte: 10.3 %
WBC: 8.5 10*3/uL (ref 3.8–10.8)

## 2020-04-26 LAB — COMPREHENSIVE METABOLIC PANEL
AG Ratio: 1.8 (calc) (ref 1.0–2.5)
ALT: 16 U/L (ref 9–46)
AST: 15 U/L (ref 10–35)
Albumin: 3.9 g/dL (ref 3.6–5.1)
Alkaline phosphatase (APISO): 95 U/L (ref 35–144)
BUN/Creatinine Ratio: 25 (calc) — ABNORMAL HIGH (ref 6–22)
BUN: 30 mg/dL — ABNORMAL HIGH (ref 7–25)
CO2: 29 mmol/L (ref 20–32)
Calcium: 9.3 mg/dL (ref 8.6–10.3)
Chloride: 100 mmol/L (ref 98–110)
Creat: 1.22 mg/dL — ABNORMAL HIGH (ref 0.70–1.11)
Globulin: 2.2 g/dL (calc) (ref 1.9–3.7)
Glucose, Bld: 150 mg/dL — ABNORMAL HIGH (ref 65–99)
Potassium: 5.5 mmol/L — ABNORMAL HIGH (ref 3.5–5.3)
Sodium: 136 mmol/L (ref 135–146)
Total Bilirubin: 0.6 mg/dL (ref 0.2–1.2)
Total Protein: 6.1 g/dL (ref 6.1–8.1)

## 2020-04-26 NOTE — Telephone Encounter (Signed)
Pt will be calling us back for his imaging location and details.  Pt will need to be there today at 2:[address].  [place], [place]

## 2020-04-26 NOTE — Telephone Encounter (Signed)
[name] spoke with patient today.

## 2020-04-26 NOTE — Telephone Encounter (Signed)
Patient states that all the imaging places for [facility] didn't have any space, they were going to try to find him a place in [place] or some place else...  Could he do an x-ray instead? Nothing available.Marland Kitchen

## 2020-04-26 NOTE — Telephone Encounter (Signed)
    Patient declined to have CT done today. He states he has become too tired and weak.

## 2020-04-27 ENCOUNTER — Other Ambulatory Visit: Payer: Self-pay | Admitting: Internal Medicine

## 2020-04-27 ENCOUNTER — Ambulatory Visit (HOSPITAL_BASED_OUTPATIENT_CLINIC_OR_DEPARTMENT_OTHER): Payer: Medicare Other

## 2020-04-27 ENCOUNTER — Other Ambulatory Visit: Payer: Self-pay | Admitting: Family

## 2020-04-27 ENCOUNTER — Ambulatory Visit (HOSPITAL_BASED_OUTPATIENT_CLINIC_OR_DEPARTMENT_OTHER)
Admission: RE | Admit: 2020-04-27 | Discharge: 2020-04-27 | Disposition: A | Discharge status: Home / Self Care | Payer: Medicare Other | Source: Ambulatory Visit | Attending: Family | Admitting: Family

## 2020-04-27 ENCOUNTER — Other Ambulatory Visit: Payer: Self-pay

## 2020-04-27 DIAGNOSIS — K56699 Other intestinal obstruction unspecified as to partial versus complete obstruction: Secondary | ICD-10-CM | POA: Insufficient documentation

## 2020-04-27 DIAGNOSIS — K429 Umbilical hernia without obstruction or gangrene: Secondary | ICD-10-CM | POA: Diagnosis not present

## 2020-04-27 DIAGNOSIS — M4319 Spondylolisthesis, multiple sites in spine: Secondary | ICD-10-CM | POA: Diagnosis not present

## 2020-04-27 DIAGNOSIS — K7689 Other specified diseases of liver: Secondary | ICD-10-CM | POA: Diagnosis not present

## 2020-04-27 MED ORDER — IOHEXOL 300 MG/ML  SOLN
100.0000 mL | Freq: Once | INTRAMUSCULAR | Status: AC | PRN
  Administered 2020-04-27: 100 mL via INTRAVENOUS

## 2020-04-27 NOTE — Telephone Encounter (Signed)
Patient has now missed 2 days of CT appointments.

## 2020-04-27 NOTE — Telephone Encounter (Signed)
Patient is calling requesting a call from Jodi Mourning. He now would like to speak with her about the scan.  Informed him it would most likely be the [place] who calls him back.

## 2020-04-27 NOTE — Telephone Encounter (Signed)
Pt states he would like to have an MRI done of his abdm b/c he is still having the same kind of abdominal pain he was describing on 9/13 OV.  After I informed him of Laura's request that he go to the ER for possible bowel obstruction he stated he had 2 loose ("almost water") stools of "normal" amt today.  I informed him that I would have to give this info to Mickel Baas to see if her recommendation has changed.  Pt verb understanding.

## 2020-04-27 NOTE — Telephone Encounter (Signed)
Pt notified that "Per Mickel Baas yes patient needs to go to the ER. She said it makes her more nervous now for the obstruction.... so he needs to go."  Pt verb understanding & states he will go to ER now.

## 2020-04-27 NOTE — Telephone Encounter (Signed)
Yes- that's appropriate. I think he needs to go to the ER regardless. I am really worried about a bowel obstruction.

## 2020-04-28 ENCOUNTER — Other Ambulatory Visit: Payer: Medicare Other

## 2020-04-28 ENCOUNTER — Other Ambulatory Visit (HOSPITAL_BASED_OUTPATIENT_CLINIC_OR_DEPARTMENT_OTHER): Payer: Medicare Other

## 2020-04-28 ENCOUNTER — Ambulatory Visit (HOSPITAL_BASED_OUTPATIENT_CLINIC_OR_DEPARTMENT_OTHER): Payer: Medicare Other

## 2020-04-29 ENCOUNTER — Other Ambulatory Visit: Payer: Self-pay | Admitting: Family

## 2020-04-29 DIAGNOSIS — K429 Umbilical hernia without obstruction or gangrene: Secondary | ICD-10-CM

## 2020-04-29 MED ORDER — ONDANSETRON 8 MG PO TBDP: 8.0000 mg | ORAL_TABLET | Freq: Three times a day (TID) | ORAL | 0 refills | Status: DC | PRN

## 2020-04-29 NOTE — Telephone Encounter (Signed)
   Patient calling to report nausea, hip and back pain. Patient has Ortho appt 9/29 Has questions about nausea  Please call

## 2020-05-02 ENCOUNTER — Telehealth: Payer: Self-pay | Admitting: Internal Medicine

## 2020-05-02 NOTE — Telephone Encounter (Signed)
Patient called and was asking for a referral for Dr. Tamala Julian in sports medicine. He said that the pain in his right hip has not subsided. He states that it is a sharp pain. He has seen Dr. Tamala Julian before but he is not sure that his referral is still good.

## 2020-05-02 NOTE — Telephone Encounter (Signed)
Needs to see Dr. Marlou Sa per Mickel Baas Phone: [phone]

## 2020-05-02 NOTE — Telephone Encounter (Signed)
Scheduled

## 2020-05-04 ENCOUNTER — Ambulatory Visit (INDEPENDENT_AMBULATORY_CARE_PROVIDER_SITE_OTHER): Payer: Medicare Other

## 2020-05-04 ENCOUNTER — Other Ambulatory Visit: Payer: Self-pay

## 2020-05-04 ENCOUNTER — Encounter: Payer: Self-pay | Admitting: Family Medicine

## 2020-05-04 ENCOUNTER — Ambulatory Visit (INDEPENDENT_AMBULATORY_CARE_PROVIDER_SITE_OTHER): Payer: Medicare Other | Admitting: Family Medicine

## 2020-05-04 VITALS — BP 116/66 | HR 93 | Ht 72.0 in | Wt 197.0 lb

## 2020-05-04 DIAGNOSIS — M160 Bilateral primary osteoarthritis of hip: Secondary | ICD-10-CM | POA: Diagnosis not present

## 2020-05-04 DIAGNOSIS — G8929 Other chronic pain: Secondary | ICD-10-CM | POA: Diagnosis not present

## 2020-05-04 DIAGNOSIS — M7062 Trochanteric bursitis, left hip: Secondary | ICD-10-CM

## 2020-05-04 DIAGNOSIS — M25551 Pain in right hip: Secondary | ICD-10-CM

## 2020-05-04 DIAGNOSIS — M7061 Trochanteric bursitis, right hip: Secondary | ICD-10-CM

## 2020-05-04 DIAGNOSIS — M5416 Radiculopathy, lumbar region: Secondary | ICD-10-CM | POA: Diagnosis not present

## 2020-05-04 DIAGNOSIS — M2550 Pain in unspecified joint: Secondary | ICD-10-CM

## 2020-05-04 DIAGNOSIS — R7989 Other specified abnormal findings of blood chemistry: Secondary | ICD-10-CM | POA: Insufficient documentation

## 2020-05-04 DIAGNOSIS — M25552 Pain in left hip: Secondary | ICD-10-CM

## 2020-05-04 NOTE — Patient Instructions (Addendum)
[phone] to schedule epidural See me in 4 weeks after epidural Labs today Xray today If pain significantly worsens please go into emergency room

## 2020-05-04 NOTE — Assessment & Plan Note (Signed)
I do believe the patient is actually had an exacerbation since his fall and is having more of a spinal stenosis with radiculopathy.  Discussed with patient in great length.  I would recommend patient attempting an epidural at the L4-L5 level secondary to the pain being on the lateral aspect of the hip.  It does show that he has had progression at the L3-L4 level as well.  Patient at this point was in significant amount of pain and even though I did not feel that the greater trochanteric was the main initial problem and likely more how he is walking and compensating attempting to greater trochanteric injections to try to help with some pain relief today.  Patient is on chronic narcotics.  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  Hopefully this will give him some mild respite until he can have the epidural.  Did not feel comfortable making any other changes in the chronic narcotics with the high-dose he is already on encouraged him to talk to primary care provider.  Secondary to the elevation in creatinine level we will recheck again and to make sure we are making some progress.  Follow-up with me again 3 weeks after epidural worsening pain seek medical attention immediately.

## 2020-05-04 NOTE — Assessment & Plan Note (Signed)
Bilateral injections given today, tolerated procedure well.  Discussed icing regimen and home exercises.  Once again felt it is more secondary to the lumbar radiculopathy and patient will be set up for an epidural.  Blood sugars have been well controlled but patient has had difficulty with creatinine.  We will recheck again at this time.  Follow-up again 3 to 4 weeks after epidural.  Once again discussed with patient if worsening pain seek medical attention immediately

## 2020-05-04 NOTE — Assessment & Plan Note (Signed)
Elevated D-dimer.  We will recheck today.  Patient did have Dopplers of the legs bilaterally that were negative.  Patient denies any chest pain, shortness of breath discussed with patient noted that if continuing to be elevated we need to consider a CT angiogram.  Patient states that that would not make a difference with him not being symptomatic was what he was suggesting.  D-dimer could be elevated secondary to the kidney function which once again we are rechecking could be secondary to the postsurgical aspect as well but hopefully we will see a downtrend.

## 2020-05-04 NOTE — Progress Notes (Signed)
[name] Phone: [phone] Subjective:   [name], am serving as a scribe for Dr. Hulan Saas. This visit occurred during the SARS-CoV-2 public health emergency.  Safety protocols were in place, including screening questions prior to the visit, additional usage of staff PPE, and extensive cleaning of exam room while observing appropriate contact time as indicated for disinfecting solutions.   I'm seeing this patient by the request  of:  Jordan Koch, MD  CC: Back pain and hip pain  [name]   04/17/2019 I attempted viscosupplementation today.  Hoping that it will make some improvement.  I expect [name] patient to need a knee replacement.  We discussed we would refer accordingly.  Patient wants to do some research on different providers and then will tell us where to put the referral in.  Encourage patient to continue to wear the brace at the moment.  Follow-up again as needed  Update 05/04/2020 [name] is a 86 y.o. male coming in with complaint of low back pain for one month. Patient fell after slipping on hickory tree nuts. Saw [name] initially. Pain is radiating down into both hips. This morning he had sharp pain radiating down into the right leg. States that he is unstable and experiencing more falls.     CT 04/27/2020 1. [name] acute abnormality. [name] evidence of bowel obstruction or acute bowel inflammation. Tiny umbilical hernia contains a portion of a small bowel loop, without acute bowel complication. 2. Aortic Atherosclerosis (ICD10-I70.0). Patient was found to have spinal fusion at L5-S1 with a marked thoracolumbar spondylosis prominent at L5-S1 with a 12 mm anterior listhesis at L5-S1 Patient then had repeat x-rays in August of that did show progression of chronic ankylosis at L4-L5.    Past Medical History:  Diagnosis Date   ACTINIC KERATOSIS, HEAD 03/23/2008   ALLERGIC  RHINITIS 03/27/2007   Arthritis    BENIGN PROSTATIC HYPERTROPHY, WITH URINARY OBSTRUCTION 10/16/2007   CAD 08/18/2007   "patient denies any issues with heart, does not see cardiologist"   Carpal tunnel syndrome, bilateral 09/04/2016   CARPAL TUNNEL SYNDROME, LEFT 04/13/2008   Chronic back pain    Chronic neck pain    Complication of anesthesia    left lower jaw teeth have been injured in the past and he has lost those, now has 2 loose front teeth due to a mouth injury on 01/02/20   DVT (deep venous thrombosis) ([place])    ERECTILE DYSFUNCTION, ORGANIC 02/20/2010   Gout, unspecified 02/20/2010   Headache(784.0)    migraines hx of   History of melanoma    HYPERLIPIDEMIA, WITH HIGH HDL 10/18/2008   HYPERTENSION 05/09/2007   sees Dr. Benay Pillow   KNEE PAIN 10/10/2009   LOW BACK PAIN 03/27/2007   NEOPLASM, MALIGNANT, SKIN, TRUNK 05/09/2007   squamous cell on scalp, sideburn and ear (right)   Patellar tendinitis 10/10/2009   Sciatic nerve pain    on Oxycodone as needed   SKIN CANCER, HX OF 03/27/2007   UNS ADVRS EFF UNS RX MEDICINAL&BIOLOGICAL SBSTNC 08/18/2007   Urinary urgency    Past Surgical History:  Procedure Laterality Date   APPENDECTOMY  6387   APPLICATION OF WOUND VAC Left 01/19/2020   Procedure: Application Of Wound Vac;  Surgeon: [name], MD;  Location: [facility];  Service: Orthopedics;  Laterality: Left;   BACK SURGERY     BLEPHAROPLASTY     CARPAL TUNNEL  RELEASE     CHOLECYSTECTOMY     ESOPHAGOGASTRODUODENOSCOPY (EGD) WITH PROPOFOL N/A 04/26/2015   Procedure: ESOPHAGOGASTRODUODENOSCOPY (EGD) WITH PROPOFOL;  Surgeon: Inda Castle, MD;  Location: [facility];  Service: Endoscopy;  Laterality: N/A;   EYE SURGERY     bilateral cataracts removed and vitrectomies   JOINT REPLACEMENT Right    knee   KNEE ARTHROSCOPY     LAMINECTOMY     LATERAL FUSION LUMBAR SPINE, TRANSVERSE     LUMBAR LAMINECTOMY/DECOMPRESSION MICRODISCECTOMY Left 11/03/2012    Procedure: LUMBAR LAMINECTOMY/DECOMPRESSION MICRODISCECTOMY 1 LEVEL;  Surgeon: Hosie Spangle, MD;  Location: [facility];  Service: Neurosurgery;  Laterality: Left;  LEFT L5S1 laminotomy foraminotomy and possible microdiskectomy   MELANOMA EXCISION     NASAL SINUS SURGERY     ORIF PATELLA Left 01/19/2020   Procedure: LEFT OPEN REDUCTION INTERNAL (ORIF) FIXATION PATELLA WITH AUTO GRAFTING USING HAMSTRING REINFORCEMENT;  Surgeon: [name], MD;  Location: [facility];  Service: Orthopedics;  Laterality: Left;   POSTERIOR CERVICAL FUSION/FORAMINOTOMY Right 10/01/2012   Procedure: POSTERIOR CERVICAL FUSION/FORAMINOTOMY LEVEL 1;  Surgeon: Hosie Spangle, MD;  Location: [facility];  Service: Neurosurgery;  Laterality: Right;  Right Cervical seven thoracic one Cervical laminectomy/foraminotomy with posterior cervical arthrodesis    POSTERIOR LUMBAR FUSION N/A 05/23/2013   Procedure: exploration of lumbar wound, explantation of left interbody implant.;  Surgeon: Hosie Spangle, MD;  Location: [facility];  Service: Neurosurgery;  Laterality: N/A;   RECTAL SURGERY     Fissure   TOTAL KNEE ARTHROPLASTY Left 05/07/2019   Procedure: LEFT TOTAL KNEE ARTHROPLASTY;  Surgeon: [name], MD;  Location: [facility];  Service: Orthopedics;  Laterality: Left;   VITRECTOMY     right and left 2013   Social History   Socioeconomic History   Marital status: Married    Spouse name: Not on file   Number of children: 3   Years of education: 12   Highest education level: Not on file  Occupational History   Occupation: Retired  Tobacco Use   Smoking status: Former Smoker    Packs/day: 1.00    Years: 33.00    Pack years: 33.00    Quit date: 09/29/1981    Years since quitting: 38.6   Smokeless tobacco: Never Used  Vaping Use   Vaping Use: Never used  Substance and Sexual Activity   Alcohol use: Yes    Alcohol/week: 3.0 standard drinks    Types: 3 Standard drinks or equivalent per  week    Comment: Social   Drug use: [name]   Sexual activity: Yes    Partners: Female    Comment: Married  Other Topics Concern   Not on file  Social History Narrative   Lives at home w/ his wife   Left-handed   Caffeine: 1 cup coffee daily   1 son alive, 2 sons deceased   Retired Building control surveyor, Furniture conservator/restorer.    Education: GED   Social Determinants of Radio broadcast assistant Strain:    Difficulty of Paying Living Expenses: Not on file  Food Insecurity:    Worried About Charity fundraiser in the Last Year: Not on file   [place] in the Last Year: Not on file  Transportation Needs:    Lack of Transportation (Medical): Not on file   Lack of Transportation (Non-Medical): Not on file  Physical Activity:    Days of Exercise per Week: Not on file   Minutes  of Exercise per Session: Not on file  Stress:    Feeling of Stress : Not on file  Social Connections:    Frequency of Communication with Friends and Family: Not on file   Frequency of Social Gatherings with Friends and Family: Not on file   Attends Religious Services: Not on file   Active Member of Clubs or Organizations: Not on file   Attends Archivist Meetings: Not on file   Marital Status: Not on file   Allergies  Allergen Reactions   Gabapentin     Lower extremity edema   Lyrica [Pregabalin] Swelling   Family History  Problem Relation Age of Onset   Dementia Mother    Anemia Father    Heart disease Father    Stroke Sister        ICH   Lung disease Sister     Current Outpatient Medications (Endocrine & Metabolic):    predniSONE (DELTASONE) 10 MG tablet, TAKE 1-4 TABLETS (10-40 MG TOTAL) BY MOUTH DAILY WITH BREAKFAST.  Current Outpatient Medications (Cardiovascular):    amLODipine (NORVASC) 10 MG tablet, TAKE 1 TABLET BY MOUTH EVERY DAY   carvedilol (COREG) 25 MG tablet, TAKE 1 TABLET BY MOUTH 2 TIMES DAILY WITH A MEAL   furosemide (LASIX) 20 MG tablet, TAKE 1 TABLET (20  MG TOTAL) BY MOUTH 2 (TWO) TIMES DAILY AS NEEDED FOR FLUID OR EDEMA.   hydrALAZINE (APRESOLINE) 50 MG tablet, TAKE 1 TABLET BY MOUTH TWICE A DAY   spironolactone (ALDACTONE) 25 MG tablet, TAKE 1 TABLET BY MOUTH EVERY DAY  Current Outpatient Medications (Respiratory):    fluticasone (FLONASE) 50 MCG/ACT nasal spray, Place 1 spray into both nostrils daily.   Current Outpatient Medications (Analgesics):    aspirin 81 MG chewable tablet, Chew 1 tablet (81 mg total) by mouth daily.   Oxycodone HCl 10 MG TABS, Take 1 tablet (10 mg total) by mouth every 4 (four) hours as needed (pain score 4-6). (Patient taking differently: Take 10 mg by mouth 4 (four) times daily as needed (pain score 4-6). )   Current Outpatient Medications (Other):    [facility] MAGNESIUM OXIDE 250 MG TABS, TAKE 1 TABLET BY MOUTH EVERY DAY   hydrocortisone 2.5 % cream, Apply 1 application topically daily as needed (itching).    methocarbamol (ROBAXIN) 500 MG tablet, Take 1 tablet (500 mg total) by mouth every 8 (eight) hours as needed for muscle spasms.   ondansetron (ZOFRAN ODT) 8 MG disintegrating tablet, Take 1 tablet (8 mg total) by mouth every 8 (eight) hours as needed for nausea or vomiting.   polyethylene glycol powder (GLYCOLAX/MIRALAX) 17 GM/SCOOP powder, Take 17 g by mouth 2 (two) times daily as needed.   tamsulosin (FLOMAX) 0.4 MG CAPS capsule, TAKE 1 CAPSULE BY MOUTH EVERY DAY   triamcinolone cream (KENALOG) 0.5 %, APPLY 1 APPLICATION TOPICALLY 2 (TWO) TIMES DAILY AS NEEDED (DRY SKIN, ITCHING).   Reviewed prior external information including notes and imaging from  primary care provider As well as notes that were available from care everywhere and other healthcare systems.  Past medical history, social, surgical and family history all reviewed in electronic medical record.  [name] pertanent information unless stated regarding to the chief complaint.   Review of Systems:  [name] headache, visual changes, nausea,  vomiting, diarrhea, constipation, dizziness, abdominal pain, skin rash, fevers, chills, night sweats, weight loss, swollen lymph nodes, body aches, joint swelling, chest pain, shortness of breath, mood changes. POSITIVE muscle aches  Objective  Blood pressure 116/66, pulse 93, height 6' (1.829 m), weight 197 lb (89.4 kg), SpO2 97 %.   General: Alert and oriented x3 the patient is moving in significant amount of pain HEENT: Pupils equal, extraocular movements intact  Respiratory: Patient's speak in full sentences and does not appear short of breath  Cardiovascular: 1+ lower extremity edema, non tender, [name] erythema  Gait antalgic MSK: Severe arthritic changes of multiple joints noted. Left knee replacement still has abnormality noted.  Seems to have some mild valgus and varus clicking noted that does cause some pain. Patient is severely tender on the lateral aspect of the hips bilaterally.  Patient does have tenderness over the greater trochanteric area.  Does have good range of motion [name] noted of the hip still at this time.  Patient's low back is severely tender to palpation mostly around the L3-S1 bilaterally.   After verbal consent patient was prepped in sterile fashion with alcohol swabs. Ethyl chloride used patient was injected with a 22-gauge 3 inch needle into the RIGHT lateral hip in the greater trochanteric area under ultrasound guidance. Picture was taken. Patient had 4 cc of 0.5% Marcaine and 1 cc of Kenalog 40 mg/dL injected. Patient tolerated the procedure well and [name] blood loss. Pain completely resolved after injection stating proper placement. Post injection instructions given.  After verbal consent patient was prepped in sterile fashion with alcohol swabs. Ethyl chloride used patient was injected with a 22-gauge 3 inch needle into the left lateral hip in the greater trochanteric area under ultrasound guidance. Picture was taken. Patient had 4 cc of 0.5% Marcaine and 1 cc of Kenalog 40  mg/dL injected. Patient tolerated the procedure well and [name] blood loss. Pain completely resolved after injection stating proper placement. Post injection instructions given.   Impression and Recommendations:     The above documentation has been reviewed and is accurate and complete Lyndal Pulley, DO       Note: This dictation was prepared with Dragon dictation along with smaller phrase technology. Any transcriptional errors that result from this process are unintentional.

## 2020-05-05 LAB — COMPREHENSIVE METABOLIC PANEL
AG Ratio: 1.8 (calc) (ref 1.0–2.5)
ALT: 13 U/L (ref 9–46)
AST: 13 U/L (ref 10–35)
Albumin: 3.8 g/dL (ref 3.6–5.1)
Alkaline phosphatase (APISO): 91 U/L (ref 35–144)
BUN: 19 mg/dL (ref 7–25)
CO2: 26 mmol/L (ref 20–32)
Calcium: 9 mg/dL (ref 8.6–10.3)
Chloride: 103 mmol/L (ref 98–110)
Creat: 1 mg/dL (ref 0.70–1.11)
Globulin: 2.1 g/dL (calc) (ref 1.9–3.7)
Glucose, Bld: 92 mg/dL (ref 65–99)
Potassium: 4.5 mmol/L (ref 3.5–5.3)
Sodium: 137 mmol/L (ref 135–146)
Total Bilirubin: 0.4 mg/dL (ref 0.2–1.2)
Total Protein: 5.9 g/dL — ABNORMAL LOW (ref 6.1–8.1)

## 2020-05-05 LAB — CBC WITH DIFFERENTIAL/PLATELET
Absolute Monocytes: 717 cells/uL (ref 200–950)
Basophils Absolute: 20 cells/uL (ref 0–200)
Basophils Relative: 0.2 %
Eosinophils Absolute: 0 cells/uL — ABNORMAL LOW (ref 15–500)
Eosinophils Relative: 0 %
HCT: 40.6 % (ref 38.5–50.0)
Hemoglobin: 13.4 g/dL (ref 13.2–17.1)
Lymphs Abs: 1788 cells/uL (ref 850–3900)
MCH: 29.1 pg (ref 27.0–33.0)
MCHC: 33 g/dL (ref 32.0–36.0)
MCV: 88.3 fL (ref 80.0–100.0)
MPV: 9.1 fL (ref 7.5–12.5)
Monocytes Relative: 7.1 %
Neutro Abs: 7575 cells/uL (ref 1500–7800)
Neutrophils Relative %: 75 %
Platelets: 398 10*3/uL (ref 140–400)
RBC: 4.6 10*6/uL (ref 4.20–5.80)
RDW: 13.9 % (ref 11.0–15.0)
Total Lymphocyte: 17.7 %
WBC: 10.1 10*3/uL (ref 3.8–10.8)

## 2020-05-05 LAB — D-DIMER, QUANTITATIVE: D-Dimer, Quant: 1.51 mcg/mL FEU — ABNORMAL HIGH (ref <0.50)

## 2020-05-09 ENCOUNTER — Telehealth: Payer: Self-pay | Admitting: Internal Medicine

## 2020-05-09 ENCOUNTER — Ambulatory Visit
Admission: RE | Admit: 2020-05-09 | Discharge: 2020-05-09 | Disposition: A | Discharge status: Home / Self Care | Payer: Medicare Other | Source: Ambulatory Visit | Attending: Family Medicine | Admitting: Family Medicine

## 2020-05-09 ENCOUNTER — Other Ambulatory Visit: Payer: Self-pay

## 2020-05-09 DIAGNOSIS — M5416 Radiculopathy, lumbar region: Secondary | ICD-10-CM | POA: Diagnosis not present

## 2020-05-09 DIAGNOSIS — M47817 Spondylosis without myelopathy or radiculopathy, lumbosacral region: Secondary | ICD-10-CM | POA: Diagnosis not present

## 2020-05-09 MED ORDER — METHYLPREDNISOLONE ACETATE 40 MG/ML INJ SUSP (RADIOLOG
120.0000 mg | Freq: Once | INTRAMUSCULAR | Status: AC
  Administered 2020-05-09: 120 mg via EPIDURAL

## 2020-05-09 MED ORDER — IOPAMIDOL (ISOVUE-M 200) INJECTION 41%
1.0000 mL | Freq: Once | INTRAMUSCULAR | Status: AC
  Administered 2020-05-09: 1 mL via EPIDURAL

## 2020-05-09 NOTE — Discharge Instructions (Signed)

## 2020-05-09 NOTE — Progress Notes (Signed)
  Chronic Care Management   Outreach Note  05/09/2020 Name: [name] MRN: [id number] DOB: 06/25/1932  Referred by: Hoyt Koch, MD Reason for referral : No chief complaint on file.   An unsuccessful telephone outreach was attempted today. The patient was referred to the pharmacist for assistance with care management and care coordination.   Follow Up Plan:   Carley Perdue [employer] Scheduler

## 2020-05-11 ENCOUNTER — Ambulatory Visit (INDEPENDENT_AMBULATORY_CARE_PROVIDER_SITE_OTHER): Payer: Medicare Other | Admitting: Orthopedic Surgery

## 2020-05-11 ENCOUNTER — Ambulatory Visit: Payer: Self-pay

## 2020-05-11 ENCOUNTER — Encounter: Payer: Self-pay | Admitting: Orthopedic Surgery

## 2020-05-11 DIAGNOSIS — M48062 Spinal stenosis, lumbar region with neurogenic claudication: Secondary | ICD-10-CM | POA: Diagnosis not present

## 2020-05-11 DIAGNOSIS — M25562 Pain in left knee: Secondary | ICD-10-CM | POA: Diagnosis not present

## 2020-05-11 DIAGNOSIS — M5416 Radiculopathy, lumbar region: Secondary | ICD-10-CM | POA: Diagnosis not present

## 2020-05-11 NOTE — Progress Notes (Signed)
Office Visit Note   Patient: [name]           Date of Birth: 03-27-1932           MRN: [id number] Visit Date: 05/11/2020 Requested by: Hoyt Koch, MD [address] [place],   [id number] PCP: Hoyt Koch, MD  Subjective: Chief Complaint  Patient presents with  . Left Knee - Routine Post Op    HPI: [name] is an 86 year old patient who underwent left total knee replacement 05/07/2019 and patella open reduction internal fixation 01/19/2020.  He has been having some weakness issues.  He has had progressive superior migration of the patella but still is able to extend the leg.  Recently was diagnosed with disc herniation and had lumbar spine ESI 3 days ago and is feeling better.  Does take chronic oxycodone.  Having balance issues as well.  Does not want to use a brace and has a cane but does not want to use.              ROS: All systems reviewed are negative as they relate to the chief complaint within the history of present illness.  Patient denies  fevers or chills.   Assessment & Plan: Visit Diagnoses:  1. Left knee pain, unspecified chronicity     Plan: Impression is left knee patella fracture inferior pole with extensor mechanism incompetency but patient still is able to achieve full extension against gravity.  I think his knee may feel to some degree unstable but there is nothing else really to be done at this time that would not put the knee at significant risk of further complication particularly involving infection.  For that reason he is going to continue to work with what he has.  I think his leg may get stronger over time.  Follow-up in 6 months for clinical recheck.  He is able to go up and down stairs but in a measured fashion.  Follow-Up Instructions: Return in about 6 months (around 11/08/2020).   Orders:  Orders Placed This Encounter  Procedures  . XR KNEE 3 VIEW LEFT   No orders of the defined types were placed in this  encounter.     Procedures: No procedures performed   Clinical Data: No additional findings.  Objective: Vital Signs: There were no vitals taken for this visit.  Physical Exam:   Constitutional: Patient appears well-developed HEENT:  Head: Normocephalic Eyes:EOM are normal Neck: Normal range of motion Cardiovascular: Normal rate Pulmonary/chest: Effort normal Neurologic: Patient is alert Skin: Skin is warm Psychiatric: Patient has normal mood and affect    Ortho Exam: Ortho exam demonstrates full extension against gravity.  Palpable defect present at the inferior pole of patella.  Collaterals are stable.  Patient has the ability to walk heel-to-toe.  Extensor mechanism strength 5- out of 5 on the left 5 out of 5 on the right he did bump his knee and has a small abrasion over the incision but no erythema or fluctuance or effusion in the left knee.  Specialty Comments:  No specialty comments available.  Imaging: XR KNEE 3 VIEW LEFT  Result Date: 05/11/2020 AP lateral merchant left knee reviewed.  Patella alta is present.  Total knee replacement in good position and alignment.  Some calcification at the proximal aspect of the patella tendon is present.    PMFS History: Patient Active Problem List   Diagnosis Date Noted  . Greater trochanteric bursitis of both hips 05/04/2020  .  Elevated d-dimer 05/04/2020  . Cellulitis of right leg 02/03/2020  . Left patella fracture 01/19/2020  . Rib pain on left side 09/18/2019  . Arthritis of left knee 05/07/2019  . Pre-operative clearance 04/28/2019  . Acute diastolic CHF (congestive heart failure) ([place]) 08/10/2018  . Leg wound, left 03/19/2018  . Right arm pain 11/11/2017  . Primary osteoarthritis of right foot 08/21/2017  . Syncope 02/27/2017  . Arthritis of right ankle 12/18/2016  . Carpal tunnel syndrome, bilateral 09/04/2016  . Leg swelling 08/16/2016  . Hand weakness 07/12/2016  . Degenerative arthritis of left knee  06/28/2016  . Iron deficiency anemia 03/16/2016  . Arthritis of left acromioclavicular joint 01/03/2016  . Rotator cuff disorder 01/03/2016  . Osteoarthritis of left knee 04/30/2015  . Baker's cyst of knee 04/30/2015  . Esophageal stricture 04/26/2015  . Benign prostatic hyperplasia 02/21/2015  . Constipation 02/21/2015  . Myalgia and myositis 11/18/2014  . Polyarticular arthritis 07/19/2013  . Chronic radicular low back pain 12/15/2012  . History of gout 02/20/2010  . ERECTILE DYSFUNCTION, ORGANIC 02/20/2010  . Hyperlipidemia 10/18/2008  . CAD 08/18/2007  . Essential hypertension 05/09/2007  . ALLERGIC RHINITIS 03/27/2007   Past Medical History:  Diagnosis Date  . ACTINIC KERATOSIS, HEAD 03/23/2008  . ALLERGIC RHINITIS 03/27/2007  . Arthritis   . BENIGN PROSTATIC HYPERTROPHY, WITH URINARY OBSTRUCTION 10/16/2007  . CAD 08/18/2007   "patient denies any issues with heart, does not see cardiologist"  . Carpal tunnel syndrome, bilateral 09/04/2016  . CARPAL TUNNEL SYNDROME, LEFT 04/13/2008  . Chronic back pain   . Chronic neck pain   . Complication of anesthesia    left lower jaw teeth have been injured in the past and he has lost those, now has 2 loose front teeth due to a mouth injury on 01/02/20  . DVT (deep venous thrombosis) ([place])   . ERECTILE DYSFUNCTION, ORGANIC 02/20/2010  . Gout, unspecified 02/20/2010  . Headache(784.0)    migraines hx of  . History of melanoma   . HYPERLIPIDEMIA, WITH HIGH HDL 10/18/2008  . HYPERTENSION 05/09/2007   sees Dr. Benay Pillow  . KNEE PAIN 10/10/2009  . LOW BACK PAIN 03/27/2007  . NEOPLASM, MALIGNANT, SKIN, TRUNK 05/09/2007   squamous cell on scalp, sideburn and ear (right)  . Patellar tendinitis 10/10/2009  . Sciatic nerve pain    on Oxycodone as needed  . SKIN CANCER, HX OF 03/27/2007  . UNS ADVRS EFF UNS RX MEDICINAL&BIOLOGICAL SBSTNC 08/18/2007  . Urinary urgency     Family History  Problem Relation Age of Onset  . Dementia Mother   . Anemia  Father   . Heart disease Father   . Stroke Sister        ICH  . Lung disease Sister     Past Surgical History:  Procedure Laterality Date  . APPENDECTOMY  1969  . APPLICATION OF WOUND VAC Left 01/19/2020   Procedure: Application Of Wound Vac;  Surgeon: [name], MD;  Location: [facility];  Service: Orthopedics;  Laterality: Left;  . BACK SURGERY    . BLEPHAROPLASTY    . CARPAL TUNNEL RELEASE    . CHOLECYSTECTOMY    . ESOPHAGOGASTRODUODENOSCOPY (EGD) WITH PROPOFOL N/A 04/26/2015   Procedure: ESOPHAGOGASTRODUODENOSCOPY (EGD) WITH PROPOFOL;  Surgeon: Inda Castle, MD;  Location: [facility];  Service: Endoscopy;  Laterality: N/A;  . EYE SURGERY     bilateral cataracts removed and vitrectomies  . JOINT REPLACEMENT Right    knee  . KNEE  ARTHROSCOPY    . LAMINECTOMY    . LATERAL FUSION LUMBAR SPINE, TRANSVERSE    . LUMBAR LAMINECTOMY/DECOMPRESSION MICRODISCECTOMY Left 11/03/2012   Procedure: LUMBAR LAMINECTOMY/DECOMPRESSION MICRODISCECTOMY 1 LEVEL;  Surgeon: Hosie Spangle, MD;  Location: [facility];  Service: Neurosurgery;  Laterality: Left;  LEFT L5S1 laminotomy foraminotomy and possible microdiskectomy  . MELANOMA EXCISION    . NASAL SINUS SURGERY    . ORIF PATELLA Left 01/19/2020   Procedure: LEFT OPEN REDUCTION INTERNAL (ORIF) FIXATION PATELLA WITH AUTO GRAFTING USING HAMSTRING REINFORCEMENT;  Surgeon: [name], MD;  Location: [facility];  Service: Orthopedics;  Laterality: Left;  . POSTERIOR CERVICAL FUSION/FORAMINOTOMY Right 10/01/2012   Procedure: POSTERIOR CERVICAL FUSION/FORAMINOTOMY LEVEL 1;  Surgeon: Hosie Spangle, MD;  Location: [facility];  Service: Neurosurgery;  Laterality: Right;  Right Cervical seven thoracic one Cervical laminectomy/foraminotomy with posterior cervical arthrodesis   . POSTERIOR LUMBAR FUSION N/A 05/23/2013   Procedure: exploration of lumbar wound, explantation of left interbody implant.;  Surgeon: Hosie Spangle, MD;  Location: [facility];  Service: Neurosurgery;  Laterality: N/A;  . RECTAL SURGERY     Fissure  . TOTAL KNEE ARTHROPLASTY Left 05/07/2019   Procedure: LEFT TOTAL KNEE ARTHROPLASTY;  Surgeon: [name], MD;  Location: [facility];  Service: Orthopedics;  Laterality: Left;  Marland Kitchen VITRECTOMY     right and left 2013   Social History   Occupational History  . Occupation: Retired  Tobacco Use  . Smoking status: Former Smoker    Packs/day: 1.00    Years: 33.00    Pack years: 33.00    Quit date: 09/29/1981    Years since quitting: 38.6  . Smokeless tobacco: Never Used  Vaping Use  . Vaping Use: Never used  Substance and Sexual Activity  . Alcohol use: Yes    Alcohol/week: 3.0 standard drinks    Types: 3 Standard drinks or equivalent per week    Comment: Social  . Drug use: No  . Sexual activity: Yes    Partners: Female    Comment: Married

## 2020-05-12 ENCOUNTER — Telehealth: Payer: Self-pay | Admitting: *Deleted

## 2020-05-12 NOTE — Telephone Encounter (Signed)
1 year post op call and survey completed.

## 2020-05-14 ENCOUNTER — Other Ambulatory Visit: Payer: Self-pay | Admitting: Internal Medicine

## 2020-05-15 ENCOUNTER — Other Ambulatory Visit: Payer: Self-pay | Admitting: Family

## 2020-05-16 ENCOUNTER — Telehealth: Payer: Self-pay | Admitting: Internal Medicine

## 2020-05-16 NOTE — Telephone Encounter (Signed)
Patient is requesting a call back from [place] about what this is all about. I informed patient Dr.Crawford is out of office. He would like a call from her assistant to go over why he was referred to this.

## 2020-05-16 NOTE — Progress Notes (Signed)
  Chronic Care Management   Note  05/16/2020 Name: RESEAN BRANDER MRN: [id number] DOB: [birth year]  STELLAN VICK is a 86 y.o. year old male who is a primary care patient of Hoyt Koch, MD. I reached out to Stark Klein by phone today in response to a referral sent by Mr. Hilbert Corrigan PCP, Hoyt Koch, MD.   Mr. Talavera was given information about Chronic Care Management services today including:  1. CCM service includes personalized support from designated clinical staff supervised by his physician, including individualized plan of care and coordination with other care providers 2. 24/7 contact phone numbers for assistance for urgent and routine care needs. 3. Service will only be billed when office clinical staff spend 20 minutes or more in a month to coordinate care. 4. Only one practitioner may furnish and bill the service in a calendar month. 5. The patient may stop CCM services at any time (effective at the end of the month) by phone call to the office staff.   Patient wishes to consider information provided and/or speak with a member of the care team before deciding about enrollment in care management services.   Follow up plan:   Carley Perdue [employer] Scheduler

## 2020-05-18 NOTE — Progress Notes (Signed)
Contacted to patient to discuss CCM services. Patient reports he is in a lot of pain today and would like to defer discussion. He requested a call back in a few weeks to revisit.

## 2020-05-19 ENCOUNTER — Encounter: Payer: Self-pay | Admitting: Family Medicine

## 2020-05-20 ENCOUNTER — Encounter (HOSPITAL_COMMUNITY): Payer: Self-pay | Admitting: *Deleted

## 2020-05-20 ENCOUNTER — Emergency Department (HOSPITAL_COMMUNITY)
Admission: EM | Admit: 2020-05-20 | Discharge: 2020-05-20 | Disposition: A | Discharge status: Home / Self Care | Payer: Medicare Other | Attending: Emergency Medicine | Admitting: Emergency Medicine

## 2020-05-20 ENCOUNTER — Other Ambulatory Visit: Payer: Self-pay

## 2020-05-20 DIAGNOSIS — G8929 Other chronic pain: Secondary | ICD-10-CM | POA: Diagnosis not present

## 2020-05-20 DIAGNOSIS — M47816 Spondylosis without myelopathy or radiculopathy, lumbar region: Secondary | ICD-10-CM | POA: Diagnosis not present

## 2020-05-20 DIAGNOSIS — Z981 Arthrodesis status: Secondary | ICD-10-CM | POA: Diagnosis not present

## 2020-05-20 DIAGNOSIS — M25551 Pain in right hip: Secondary | ICD-10-CM | POA: Diagnosis not present

## 2020-05-20 DIAGNOSIS — G8911 Acute pain due to trauma: Secondary | ICD-10-CM | POA: Diagnosis not present

## 2020-05-20 DIAGNOSIS — M47897 Other spondylosis, lumbosacral region: Secondary | ICD-10-CM | POA: Diagnosis not present

## 2020-05-20 DIAGNOSIS — Z9889 Other specified postprocedural states: Secondary | ICD-10-CM | POA: Diagnosis not present

## 2020-05-20 DIAGNOSIS — M5440 Lumbago with sciatica, unspecified side: Secondary | ICD-10-CM | POA: Diagnosis not present

## 2020-05-20 DIAGNOSIS — I10 Essential (primary) hypertension: Secondary | ICD-10-CM | POA: Diagnosis not present

## 2020-05-20 DIAGNOSIS — Z79899 Other long term (current) drug therapy: Secondary | ICD-10-CM | POA: Insufficient documentation

## 2020-05-20 DIAGNOSIS — M5136 Other intervertebral disc degeneration, lumbar region: Secondary | ICD-10-CM | POA: Diagnosis not present

## 2020-05-20 DIAGNOSIS — M7061 Trochanteric bursitis, right hip: Secondary | ICD-10-CM | POA: Diagnosis not present

## 2020-05-20 DIAGNOSIS — Z87891 Personal history of nicotine dependence: Secondary | ICD-10-CM | POA: Diagnosis not present

## 2020-05-20 DIAGNOSIS — M545 Low back pain, unspecified: Secondary | ICD-10-CM | POA: Diagnosis present

## 2020-05-20 DIAGNOSIS — M47896 Other spondylosis, lumbar region: Secondary | ICD-10-CM | POA: Diagnosis not present

## 2020-05-20 DIAGNOSIS — Z7982 Long term (current) use of aspirin: Secondary | ICD-10-CM | POA: Diagnosis not present

## 2020-05-20 DIAGNOSIS — Z85828 Personal history of other malignant neoplasm of skin: Secondary | ICD-10-CM | POA: Insufficient documentation

## 2020-05-20 DIAGNOSIS — M5441 Lumbago with sciatica, right side: Secondary | ICD-10-CM | POA: Diagnosis not present

## 2020-05-20 DIAGNOSIS — I5031 Acute diastolic (congestive) heart failure: Secondary | ICD-10-CM | POA: Diagnosis not present

## 2020-05-20 DIAGNOSIS — M47817 Spondylosis without myelopathy or radiculopathy, lumbosacral region: Secondary | ICD-10-CM | POA: Diagnosis not present

## 2020-05-20 DIAGNOSIS — M5137 Other intervertebral disc degeneration, lumbosacral region: Secondary | ICD-10-CM | POA: Diagnosis not present

## 2020-05-20 DIAGNOSIS — I110 Hypertensive heart disease with heart failure: Secondary | ICD-10-CM | POA: Insufficient documentation

## 2020-05-20 DIAGNOSIS — Z96651 Presence of right artificial knee joint: Secondary | ICD-10-CM | POA: Insufficient documentation

## 2020-05-20 DIAGNOSIS — I2510 Atherosclerotic heart disease of native coronary artery without angina pectoris: Secondary | ICD-10-CM | POA: Insufficient documentation

## 2020-05-20 DIAGNOSIS — Z888 Allergy status to other drugs, medicaments and biological substances status: Secondary | ICD-10-CM | POA: Diagnosis not present

## 2020-05-20 DIAGNOSIS — Z043 Encounter for examination and observation following other accident: Secondary | ICD-10-CM | POA: Diagnosis not present

## 2020-05-20 MED ORDER — ONDANSETRON HCL 4 MG/2ML IJ SOLN
4.0000 mg | Freq: Once | INTRAMUSCULAR | Status: AC
  Administered 2020-05-20: 4 mg via INTRAVENOUS
  Filled 2020-05-20: qty 2

## 2020-05-20 MED ORDER — HYDROMORPHONE HCL 1 MG/ML IJ SOLN
1.0000 mg | Freq: Once | INTRAMUSCULAR | Status: AC
  Administered 2020-05-20: 1 mg via INTRAVENOUS
  Filled 2020-05-20: qty 1

## 2020-05-20 MED ORDER — OXYCODONE HCL 5 MG PO TABS
10.0000 mg | ORAL_TABLET | Freq: Once | ORAL | Status: AC
  Administered 2020-05-20: 10 mg via ORAL
  Filled 2020-05-20: qty 2

## 2020-05-20 NOTE — ED Notes (Signed)
Pt discharged with daughter, took  all belongings, discharge instructions given non furthewr questions asked at this time, vss on ccm. Pt able to pivot from stretcher to wheelchair to car.

## 2020-05-20 NOTE — ED Triage Notes (Signed)
Patient presents to ed c/o lower back pain onset 2 weeks ago, states he had an epidural last Monday and hasn't helped, pain isn't any different tonight just not going away. Hasn't followed up with his PCP.

## 2020-05-20 NOTE — ED Notes (Signed)
PA browning at bedside

## 2020-05-20 NOTE — ED Notes (Signed)
Pt aao4, gcs15, reporting going to pain clinic after recent emergency room visit, pt has difficulty controlling pain despite clinic interventions, Pt has difficulty ambulating but other wise stable. Vss on ccm, daughter at bedside, call bell in reach

## 2020-05-20 NOTE — ED Provider Notes (Signed)
[facility] EMERGENCY DEPARTMENT Provider Note   CSN: [id number] Arrival date & time: 05/20/20  0350     History Chief Complaint  Patient presents with  . Back Pain    [name] is a 86 y.o. male.  Patient presents to the emergency department with a chief complaint of low back pain.  He has chronic back pain.  He has been on chronic oxycodone for the past 17 years.  He states that he recently had a flareup of his back pain and was seen by sports medicine and had an epidural injection.  He reported no improvement with this, and went to a pain management doctor and they discontinued his oxycodone and started him on Nucynta.  He states that he has had no relief with the Nucynta and now his pain is intolerable.  He denies fever, chills, bowel or bladder incontinence, or extremity weakness.  The pain does radiate into his lower extremity.  He denies any other associated symptoms.  He states that his pain management doctor does not work on Friday and does not return to the office until Tuesday of next week.  The history is provided by the patient. No language interpreter was used.       Past Medical History:  Diagnosis Date  . ACTINIC KERATOSIS, HEAD 03/23/2008  . ALLERGIC RHINITIS 03/27/2007  . Arthritis   . BENIGN PROSTATIC HYPERTROPHY, WITH URINARY OBSTRUCTION 10/16/2007  . CAD 08/18/2007   "patient denies any issues with heart, does not see cardiologist"  . Carpal tunnel syndrome, bilateral 09/04/2016  . CARPAL TUNNEL SYNDROME, LEFT 04/13/2008  . Chronic back pain   . Chronic neck pain   . Complication of anesthesia    left lower jaw teeth have been injured in the past and he has lost those, now has 2 loose front teeth due to a mouth injury on 01/02/20  . DVT (deep venous thrombosis) ([place])   . ERECTILE DYSFUNCTION, ORGANIC 02/20/2010  . Gout, unspecified 02/20/2010  . Headache(784.0)    migraines hx of  . History of melanoma   . HYPERLIPIDEMIA, WITH HIGH HDL  10/18/2008  . HYPERTENSION 05/09/2007   sees Dr. Benay Pillow  . KNEE PAIN 10/10/2009  . LOW BACK PAIN 03/27/2007  . NEOPLASM, MALIGNANT, SKIN, TRUNK 05/09/2007   squamous cell on scalp, sideburn and ear (right)  . Patellar tendinitis 10/10/2009  . Sciatic nerve pain    on Oxycodone as needed  . SKIN CANCER, HX OF 03/27/2007  . UNS ADVRS EFF UNS RX MEDICINAL&BIOLOGICAL SBSTNC 08/18/2007  . Urinary urgency     Patient Active Problem List   Diagnosis Date Noted  . Greater trochanteric bursitis of both hips 05/04/2020  . Elevated d-dimer 05/04/2020  . Cellulitis of right leg 02/03/2020  . Left patella fracture 01/19/2020  . Rib pain on left side 09/18/2019  . Arthritis of left knee 05/07/2019  . Pre-operative clearance 04/28/2019  . Acute diastolic CHF (congestive heart failure) ([place]) 08/10/2018  . Leg wound, left 03/19/2018  . Right arm pain 11/11/2017  . Primary osteoarthritis of right foot 08/21/2017  . Syncope 02/27/2017  . Arthritis of right ankle 12/18/2016  . Carpal tunnel syndrome, bilateral 09/04/2016  . Leg swelling 08/16/2016  . Hand weakness 07/12/2016  . Degenerative arthritis of left knee 06/28/2016  . Iron deficiency anemia 03/16/2016  . Arthritis of left acromioclavicular joint 01/03/2016  . Rotator cuff disorder 01/03/2016  . Osteoarthritis of left knee 04/30/2015  . Baker's cyst of  knee 04/30/2015  . Esophageal stricture 04/26/2015  . Benign prostatic hyperplasia 02/21/2015  . Constipation 02/21/2015  . Myalgia and myositis 11/18/2014  . Polyarticular arthritis 07/19/2013  . Chronic radicular low back pain 12/15/2012  . History of gout 02/20/2010  . ERECTILE DYSFUNCTION, ORGANIC 02/20/2010  . Hyperlipidemia 10/18/2008  . CAD 08/18/2007  . Essential hypertension 05/09/2007  . ALLERGIC RHINITIS 03/27/2007    Past Surgical History:  Procedure Laterality Date  . APPENDECTOMY  1969  . APPLICATION OF WOUND VAC Left 01/19/2020   Procedure: Application Of Wound  Vac;  Surgeon: [name], MD;  Location: [facility];  Service: Orthopedics;  Laterality: Left;  . BACK SURGERY    . BLEPHAROPLASTY    . CARPAL TUNNEL RELEASE    . CHOLECYSTECTOMY    . ESOPHAGOGASTRODUODENOSCOPY (EGD) WITH PROPOFOL N/A 04/26/2015   Procedure: ESOPHAGOGASTRODUODENOSCOPY (EGD) WITH PROPOFOL;  Surgeon: Inda Castle, MD;  [facility];  Service: Endoscopy;  Laterality: N/A;  . EYE SURGERY     bilateral cataracts removed and vitrectomies  . JOINT REPLACEMENT Right    knee  . KNEE ARTHROSCOPY    . LAMINECTOMY    . LATERAL FUSION LUMBAR SPINE, TRANSVERSE    . LUMBAR LAMINECTOMY/DECOMPRESSION MICRODISCECTOMY Left 11/03/2012   Procedure: LUMBAR LAMINECTOMY/DECOMPRESSION MICRODISCECTOMY 1 LEVEL;  Surgeon: Hosie Spangle, MD;  Location: [facility];  Service: Neurosurgery;  Laterality: Left;  LEFT L5S1 laminotomy foraminotomy and possible microdiskectomy  . MELANOMA EXCISION    . NASAL SINUS SURGERY    . ORIF PATELLA Left 01/19/2020   Procedure: LEFT OPEN REDUCTION INTERNAL (ORIF) FIXATION PATELLA WITH AUTO GRAFTING USING HAMSTRING REINFORCEMENT;  Surgeon: [name], MD;  Location: [facility];  Service: Orthopedics;  Laterality: Left;  . POSTERIOR CERVICAL FUSION/FORAMINOTOMY Right 10/01/2012   Procedure: POSTERIOR CERVICAL FUSION/FORAMINOTOMY LEVEL 1;  Surgeon: Hosie Spangle, MD;  Location: [facility];  Service: Neurosurgery;  Laterality: Right;  Right Cervical seven thoracic one Cervical laminectomy/foraminotomy with posterior cervical arthrodesis   . POSTERIOR LUMBAR FUSION N/A 05/23/2013   Procedure: exploration of lumbar wound, explantation of left interbody implant.;  Surgeon: Hosie Spangle, MD;  Location: [facility];  Service: Neurosurgery;  Laterality: N/A;  . RECTAL SURGERY     Fissure  . TOTAL KNEE ARTHROPLASTY Left 05/07/2019   Procedure: LEFT TOTAL KNEE ARTHROPLASTY;  Surgeon: [name], MD;  Location: [facility];  Service: Orthopedics;   Laterality: Left;  Marland Kitchen VITRECTOMY     right and left 2013       Family History  Problem Relation Age of Onset  . Dementia Mother   . Anemia Father   . Heart disease Father   . Stroke Sister        ICH  . Lung disease Sister     Social History   Tobacco Use  . Smoking status: Former Smoker    Packs/day: 1.00    Years: 33.00    Pack years: 33.00    Quit date: 09/29/1981    Years since quitting: 38.6  . Smokeless tobacco: Never Used  Vaping Use  . Vaping Use: Never used  Substance Use Topics  . Alcohol use: Yes    Alcohol/week: 3.0 standard drinks    Types: 3 Standard drinks or equivalent per week    Comment: Social  . Drug use: No    Home Medications Prior to Admission medications   Medication Sig Start Date End Date Taking? Authorizing Provider  amLODipine (NORVASC) 10 MG  tablet TAKE 1 TABLET BY MOUTH EVERY DAY 01/13/20   Hoyt Koch, MD  aspirin 81 MG chewable tablet Chew 1 tablet (81 mg total) by mouth daily. 01/20/20   Magnant, Charles L, PA-C  carvedilol (COREG) 25 MG tablet TAKE 1 TABLET BY MOUTH 2 TIMES DAILY WITH A MEAL 02/26/20   Hoyt Koch, MD  [facility] MAGNESIUM OXIDE 250 MG TABS TAKE 1 TABLET BY MOUTH EVERY DAY 08/04/19   Jessy Oto, MD  fluticasone (FLONASE) 50 MCG/ACT nasal spray Place 1 spray into both nostrils daily.     [provider]  furosemide (LASIX) 20 MG tablet TAKE 1 TABLET (20 MG TOTAL) BY MOUTH 2 (TWO) TIMES DAILY AS NEEDED FOR FLUID OR EDEMA. 05/16/20   Marrian Salvage, FNP  hydrALAZINE (APRESOLINE) 50 MG tablet TAKE 1 TABLET BY MOUTH TWICE A DAY 03/21/20   Hoyt Koch, MD  hydrocortisone 2.5 % cream Apply 1 application topically daily as needed (itching).     [provider]  methocarbamol (ROBAXIN) 500 MG tablet Take 1 tablet (500 mg total) by mouth every 8 (eight) hours as needed for muscle spasms. 01/20/20   Magnant, Charles L, PA-C  ondansetron (ZOFRAN ODT) 8 MG disintegrating tablet Take 1  tablet (8 mg total) by mouth every 8 (eight) hours as needed for nausea or vomiting. 04/29/20   Marrian Salvage, FNP  Oxycodone HCl 10 MG TABS Take 1 tablet (10 mg total) by mouth every 4 (four) hours as needed (pain score 4-6). Patient taking differently: Take 10 mg by mouth 4 (four) times daily as needed (pain score 4-6).  06/01/19   Magnant, Charles L, PA-C  polyethylene glycol powder (GLYCOLAX/MIRALAX) 17 GM/SCOOP powder Take 17 g by mouth 2 (two) times daily as needed. 04/15/20   Marrian Salvage, FNP  predniSONE (DELTASONE) 10 MG tablet TAKE 1-4 TABLETS (10-40 MG TOTAL) BY MOUTH DAILY WITH BREAKFAST. 05/16/20   Hoyt Koch, MD  spironolactone (ALDACTONE) 25 MG tablet TAKE 1 TABLET BY MOUTH EVERY DAY 04/05/20   Hoyt Koch, MD  tamsulosin (FLOMAX) 0.4 MG CAPS capsule TAKE 1 CAPSULE BY MOUTH EVERY DAY 01/18/20   Hoyt Koch, MD  triamcinolone cream (KENALOG) 0.5 % APPLY 1 APPLICATION TOPICALLY 2 (TWO) TIMES DAILY AS NEEDED (DRY SKIN, ITCHING). 08/19/19   Hoyt Koch, MD    Allergies    Gabapentin and Lyrica [pregabalin]  Review of Systems   Review of Systems  All other systems reviewed and are negative.   Physical Exam Updated Vital Signs BP (!) 152/84 (BP Location: Right Arm)   Pulse 93   Temp 98 F (36.7 C) (Oral)   Resp 18   Ht 6' (1.829 m)   Wt 85.7 kg   SpO2 99%   BMI 25.63 kg/m   Physical Exam Physical Exam  Constitutional: Pt appears well-developed and well-nourished. No distress.  HENT:  Head: Normocephalic and atraumatic.  Mouth/Throat: Oropharynx is clear and moist. No oropharyngeal exudate.  Eyes: Conjunctivae are normal.  Neck: Normal range of motion. Neck supple.  No meningismus Cardiovascular: Normal rate, regular rhythm and intact distal pulses.   Pulmonary/Chest: Effort normal and breath sounds normal. No respiratory distress. Pt has no wheezes.  Abdominal: Pt exhibits no distension Musculoskeletal:  Lumbar  paraspinal muscles tender to palpation, no bony CTLS spine tenderness, deformity, step-off, or crepitus Lymphadenopathy: Pt has no cervical adenopathy.  Neurological: Pt is alert and oriented Speech is clear and goal oriented, follows commands  Normal 5/5 strength in upper and lower extremities bilaterally including dorsiflexion and plantar flexion, strong and equal grip strength Sensation intact Great toe extension intact Moves extremities without ataxia, coordination intact Ankle and knee jerk reflexes intact and symmetrical  No Clonus Skin: Skin is warm and dry. No rash noted. Pt is not diaphoretic. No erythema.  Psychiatric: Pt has a normal mood and affect. Behavior is normal.  Nursing note and vitals reviewed.  ED Results / Procedures / Treatments   Labs (all labs ordered are listed, but only abnormal results are displayed) Labs Reviewed - No data to display  EKG None  Radiology No results found.  Procedures Procedures (including critical care time)  Medications Ordered in ED Medications  HYDROmorphone (DILAUDID) injection 1 mg (has no administration in time range)    ED Course  I have reviewed the triage vital signs and the nursing notes.  Pertinent labs & imaging results that were available during my care of the patient were reviewed by me and considered in my medical decision making (see chart for details).    MDM Rules/Calculators/A&P                          Patient with back pain.    No neurological deficits and normal neuro exam.  Patient is ambulatory.  No loss of bowel or bladder control.  Doubt cauda equina.  Denies fever,  doubt epidural abscess or other lesion. Recommend back exercises, stretching, RICE, and f/u with his pain management doctor.  Encouraged the patient that there could be a need for additional workup and/or imaging such as MRI, if the symptoms do not resolve. Patient advised that if the back pain does not resolve, or radiates, this could  progress to more serious conditions and is encouraged to follow-up with PCP or orthopedics within 2 weeks.    Patient seen by and discussed with Dr. Laverta Baltimore, who agrees with the plan.  Final Clinical Impression(s) / ED Diagnoses Final diagnoses:  Chronic low back pain with sciatica, sciatica laterality unspecified, unspecified back pain laterality    Rx / DC Orders ED Discharge Orders    None       Montine Circle, PA-C 05/20/20 0542    Margette Fast, MD 05/20/20 [phone]

## 2020-05-23 ENCOUNTER — Encounter: Payer: Self-pay | Admitting: Family Medicine

## 2020-05-25 ENCOUNTER — Telehealth: Payer: Self-pay | Admitting: Internal Medicine

## 2020-05-25 NOTE — Telephone Encounter (Signed)
Called pt to verify msg. Pt confuse on why he need to see surgeon. Verified CT results that was done on 9/17. Gave him Mickel Baas response from report again. Inform him the reason she sent him to see a surgeon is because he has a umbilical hernia, and he had been c/o of nausea. Pt states he is no longer having the nausea. He states the only problem he is having is being in pain from his back. He does not meed to see a Psychologist, sport and exercise. Inform pt he would need to contact his back specialist and make a f/u appt w/them concerning his pain. Inform him he does not have to see the surgeon if he does not want to. It is up to him.pt states he is going to hold off seeing the surgeon concerning the umbilical hernia. He will f/u w/his back specialist bcz he is in bad shape due to all this pain...Johny Chess

## 2020-05-25 NOTE — Telephone Encounter (Signed)
    Patient wants to discuss the reason for CT scan. Patient states he is not sure why he needs the scan (ordered by L. Valere Dross) Patient requesting call back to discuss

## 2020-05-26 DIAGNOSIS — Z6824 Body mass index (BMI) 24.0-24.9, adult: Secondary | ICD-10-CM | POA: Diagnosis not present

## 2020-05-26 DIAGNOSIS — I10 Essential (primary) hypertension: Secondary | ICD-10-CM | POA: Diagnosis not present

## 2020-05-26 DIAGNOSIS — M4726 Other spondylosis with radiculopathy, lumbar region: Secondary | ICD-10-CM | POA: Diagnosis not present

## 2020-05-26 DIAGNOSIS — M48062 Spinal stenosis, lumbar region with neurogenic claudication: Secondary | ICD-10-CM | POA: Diagnosis not present

## 2020-05-28 ENCOUNTER — Other Ambulatory Visit: Payer: Self-pay | Admitting: Family

## 2020-06-01 DIAGNOSIS — M4726 Other spondylosis with radiculopathy, lumbar region: Secondary | ICD-10-CM | POA: Diagnosis not present

## 2020-06-01 DIAGNOSIS — M545 Low back pain, unspecified: Secondary | ICD-10-CM | POA: Diagnosis not present

## 2020-06-07 ENCOUNTER — Other Ambulatory Visit: Payer: Self-pay | Admitting: Internal Medicine

## 2020-06-07 ENCOUNTER — Other Ambulatory Visit: Payer: Self-pay | Admitting: Neurosurgery

## 2020-06-07 DIAGNOSIS — S32010A Wedge compression fracture of first lumbar vertebra, initial encounter for closed fracture: Secondary | ICD-10-CM | POA: Diagnosis not present

## 2020-06-07 DIAGNOSIS — S32020A Wedge compression fracture of second lumbar vertebra, initial encounter for closed fracture: Secondary | ICD-10-CM | POA: Diagnosis not present

## 2020-06-07 DIAGNOSIS — S22080A Wedge compression fracture of T11-T12 vertebra, initial encounter for closed fracture: Secondary | ICD-10-CM | POA: Diagnosis not present

## 2020-06-08 ENCOUNTER — Other Ambulatory Visit (HOSPITAL_COMMUNITY)
Admission: RE | Admit: 2020-06-08 | Discharge: 2020-06-08 | Disposition: A | Discharge status: Home / Self Care | Payer: Medicare Other | Source: Ambulatory Visit | Attending: Neurosurgery | Admitting: Neurosurgery

## 2020-06-08 DIAGNOSIS — Z01812 Encounter for preprocedural laboratory examination: Secondary | ICD-10-CM | POA: Insufficient documentation

## 2020-06-08 DIAGNOSIS — Z20822 Contact with and (suspected) exposure to covid-19: Secondary | ICD-10-CM | POA: Insufficient documentation

## 2020-06-08 LAB — SARS CORONAVIRUS 2 (TAT 6-24 HRS): SARS Coronavirus 2: NEGATIVE

## 2020-06-09 ENCOUNTER — Encounter (HOSPITAL_COMMUNITY): Payer: Self-pay | Admitting: Neurosurgery

## 2020-06-09 ENCOUNTER — Other Ambulatory Visit (HOSPITAL_COMMUNITY): Payer: Medicare Other

## 2020-06-10 ENCOUNTER — Ambulatory Visit (HOSPITAL_COMMUNITY)
Admission: RE | Admit: 2020-06-10 | Discharge: 2020-06-10 | Disposition: A | Discharge status: Home / Self Care | Payer: Medicare Other | Attending: Neurosurgery | Admitting: Neurosurgery

## 2020-06-10 ENCOUNTER — Encounter (HOSPITAL_COMMUNITY): Payer: Self-pay | Admitting: Neurosurgery

## 2020-06-10 ENCOUNTER — Ambulatory Visit (HOSPITAL_COMMUNITY): Payer: Medicare Other | Admitting: Physician Assistant

## 2020-06-10 ENCOUNTER — Ambulatory Visit (HOSPITAL_COMMUNITY): Payer: Medicare Other

## 2020-06-10 ENCOUNTER — Other Ambulatory Visit: Payer: Self-pay

## 2020-06-10 ENCOUNTER — Encounter (HOSPITAL_COMMUNITY)
Admission: RE | Disposition: A | Discharge status: Home / Self Care | Payer: Self-pay | Source: Home / Self Care | Attending: Neurosurgery

## 2020-06-10 DIAGNOSIS — M4856XA Collapsed vertebra, not elsewhere classified, lumbar region, initial encounter for fracture: Secondary | ICD-10-CM | POA: Diagnosis not present

## 2020-06-10 DIAGNOSIS — I110 Hypertensive heart disease with heart failure: Secondary | ICD-10-CM | POA: Diagnosis not present

## 2020-06-10 DIAGNOSIS — I5031 Acute diastolic (congestive) heart failure: Secondary | ICD-10-CM | POA: Diagnosis not present

## 2020-06-10 DIAGNOSIS — S32020A Wedge compression fracture of second lumbar vertebra, initial encounter for closed fracture: Secondary | ICD-10-CM | POA: Diagnosis not present

## 2020-06-10 DIAGNOSIS — S32010A Wedge compression fracture of first lumbar vertebra, initial encounter for closed fracture: Secondary | ICD-10-CM | POA: Diagnosis not present

## 2020-06-10 DIAGNOSIS — Z419 Encounter for procedure for purposes other than remedying health state, unspecified: Secondary | ICD-10-CM

## 2020-06-10 DIAGNOSIS — M4855XA Collapsed vertebra, not elsewhere classified, thoracolumbar region, initial encounter for fracture: Secondary | ICD-10-CM | POA: Diagnosis not present

## 2020-06-10 DIAGNOSIS — M4854XA Collapsed vertebra, not elsewhere classified, thoracic region, initial encounter for fracture: Secondary | ICD-10-CM | POA: Insufficient documentation

## 2020-06-10 DIAGNOSIS — M5147 Schmorl's nodes, lumbosacral region: Secondary | ICD-10-CM | POA: Diagnosis not present

## 2020-06-10 DIAGNOSIS — E785 Hyperlipidemia, unspecified: Secondary | ICD-10-CM | POA: Diagnosis not present

## 2020-06-10 DIAGNOSIS — S22080A Wedge compression fracture of T11-T12 vertebra, initial encounter for closed fracture: Secondary | ICD-10-CM | POA: Diagnosis not present

## 2020-06-10 HISTORY — PX: KYPHOPLASTY: SHX5884

## 2020-06-10 LAB — BASIC METABOLIC PANEL
Anion gap: 13 (ref 5–15)
BUN: 36 mg/dL — ABNORMAL HIGH (ref 8–23)
CO2: 25 mmol/L (ref 22–32)
Calcium: 9.3 mg/dL (ref 8.9–10.3)
Chloride: 99 mmol/L (ref 98–111)
Creatinine, Ser: 1.23 mg/dL (ref 0.61–1.24)
GFR, Estimated: 57 mL/min — ABNORMAL LOW (ref >60)
Glucose, Bld: 110 mg/dL — ABNORMAL HIGH (ref 70–99)
Potassium: 3.7 mmol/L (ref 3.5–5.1)
Sodium: 137 mmol/L (ref 135–145)

## 2020-06-10 LAB — CBC
HCT: 44.1 % (ref 39.0–52.0)
Hemoglobin: 13.9 g/dL (ref 13.0–17.0)
MCH: 28.9 pg (ref 26.0–34.0)
MCHC: 31.5 g/dL (ref 30.0–36.0)
MCV: 91.7 fL (ref 80.0–100.0)
Platelets: 394 10*3/uL (ref 150–400)
RBC: 4.81 MIL/uL (ref 4.22–5.81)
RDW: 15 % (ref 11.5–15.5)
WBC: 10.2 10*3/uL (ref 4.0–10.5)
nRBC: 0 % (ref 0.0–0.2)

## 2020-06-10 LAB — SURGICAL PCR SCREEN
MRSA, PCR: NEGATIVE
Staphylococcus aureus: POSITIVE — AB

## 2020-06-10 SURGERY — KYPHOPLASTY
Anesthesia: General | Site: Back

## 2020-06-10 MED ORDER — CEFAZOLIN SODIUM-DEXTROSE 2-4 GM/100ML-% IV SOLN
INTRAVENOUS | Status: AC
  Filled 2020-06-10: qty 100

## 2020-06-10 MED ORDER — ONDANSETRON HCL 4 MG/2ML IJ SOLN: 4.0000 mg | Freq: Once | INTRAMUSCULAR | Status: DC | PRN

## 2020-06-10 MED ORDER — IOPAMIDOL (ISOVUE-300) INJECTION 61%
INTRAVENOUS | Status: DC | PRN
  Administered 2020-06-10 (×2): 50 mL

## 2020-06-10 MED ORDER — LACTATED RINGERS IV SOLN: INTRAVENOUS | Status: DC

## 2020-06-10 MED ORDER — PROPOFOL 10 MG/ML IV BOLUS
INTRAVENOUS | Status: AC
  Filled 2020-06-10: qty 20

## 2020-06-10 MED ORDER — CHLORHEXIDINE GLUCONATE 0.12 % MT SOLN
15.0000 mL | Freq: Once | OROMUCOSAL | Status: AC
  Administered 2020-06-10: 15 mL via OROMUCOSAL
  Filled 2020-06-10: qty 15

## 2020-06-10 MED ORDER — ONDANSETRON HCL 4 MG/2ML IJ SOLN
INTRAMUSCULAR | Status: DC | PRN
  Administered 2020-06-10: 4 mg via INTRAVENOUS

## 2020-06-10 MED ORDER — SUGAMMADEX SODIUM 200 MG/2ML IV SOLN
INTRAVENOUS | Status: DC | PRN
  Administered 2020-06-10: 200 mg via INTRAVENOUS

## 2020-06-10 MED ORDER — ACETAMINOPHEN 500 MG PO TABS
1000.0000 mg | ORAL_TABLET | Freq: Once | ORAL | Status: AC
  Administered 2020-06-10: 1000 mg via ORAL
  Filled 2020-06-10: qty 2

## 2020-06-10 MED ORDER — HYDROMORPHONE HCL 1 MG/ML IJ SOLN
0.2500 mg | INTRAMUSCULAR | Status: DC | PRN
  Administered 2020-06-10: 0.5 mg via INTRAVENOUS

## 2020-06-10 MED ORDER — LIDOCAINE-EPINEPHRINE 0.5 %-1:200000 IJ SOLN
INTRAMUSCULAR | Status: AC
  Filled 2020-06-10: qty 1

## 2020-06-10 MED ORDER — ACETAMINOPHEN 160 MG/5ML PO SOLN: 960.0000 mg | Freq: Once | ORAL | Status: AC

## 2020-06-10 MED ORDER — FENTANYL CITRATE (PF) 250 MCG/5ML IJ SOLN
INTRAMUSCULAR | Status: AC
  Filled 2020-06-10: qty 5

## 2020-06-10 MED ORDER — LIDOCAINE 2% (20 MG/ML) 5 ML SYRINGE
INTRAMUSCULAR | Status: DC | PRN
  Administered 2020-06-10: 60 mg via INTRAVENOUS

## 2020-06-10 MED ORDER — LIDOCAINE-EPINEPHRINE 0.5 %-1:200000 IJ SOLN
INTRAMUSCULAR | Status: DC | PRN
  Administered 2020-06-10: 14 mL

## 2020-06-10 MED ORDER — CHLORHEXIDINE GLUCONATE CLOTH 2 % EX PADS: 6.0000 | MEDICATED_PAD | Freq: Once | CUTANEOUS | Status: DC

## 2020-06-10 MED ORDER — OXYCODONE HCL 5 MG PO TABS
ORAL_TABLET | ORAL | Status: AC
  Administered 2020-06-10: 5 mg via ORAL
  Filled 2020-06-10: qty 1

## 2020-06-10 MED ORDER — OXYCODONE HCL 5 MG PO TABS: 5.0000 mg | ORAL_TABLET | Freq: Once | ORAL | Status: AC

## 2020-06-10 MED ORDER — FENTANYL CITRATE (PF) 250 MCG/5ML IJ SOLN
INTRAMUSCULAR | Status: DC | PRN
  Administered 2020-06-10 (×2): 50 ug via INTRAVENOUS
  Administered 2020-06-10 (×2): 25 ug via INTRAVENOUS
  Administered 2020-06-10: 50 ug via INTRAVENOUS

## 2020-06-10 MED ORDER — ROCURONIUM BROMIDE 10 MG/ML (PF) SYRINGE
PREFILLED_SYRINGE | INTRAVENOUS | Status: DC | PRN
  Administered 2020-06-10: 40 mg via INTRAVENOUS

## 2020-06-10 MED ORDER — PROPOFOL 10 MG/ML IV BOLUS
INTRAVENOUS | Status: DC | PRN
  Administered 2020-06-10: 150 mg via INTRAVENOUS

## 2020-06-10 MED ORDER — DEXAMETHASONE SODIUM PHOSPHATE 10 MG/ML IJ SOLN
INTRAMUSCULAR | Status: DC | PRN
  Administered 2020-06-10: 10 mg via INTRAVENOUS

## 2020-06-10 MED ORDER — ORAL CARE MOUTH RINSE: 15.0000 mL | Freq: Once | OROMUCOSAL | Status: AC

## 2020-06-10 MED ORDER — 0.9 % SODIUM CHLORIDE (POUR BTL) OPTIME
TOPICAL | Status: DC | PRN
  Administered 2020-06-10: 1000 mL

## 2020-06-10 MED ORDER — HYDROMORPHONE HCL 1 MG/ML IJ SOLN
INTRAMUSCULAR | Status: AC
  Administered 2020-06-10: 0.5 mg via INTRAVENOUS
  Filled 2020-06-10: qty 1

## 2020-06-10 MED ORDER — CEFAZOLIN SODIUM-DEXTROSE 2-4 GM/100ML-% IV SOLN
2.0000 g | INTRAVENOUS | Status: AC
  Administered 2020-06-10: 2 g via INTRAVENOUS

## 2020-06-10 MED ORDER — PHENYLEPHRINE HCL-NACL 10-0.9 MG/250ML-% IV SOLN
INTRAVENOUS | Status: DC | PRN
  Administered 2020-06-10: 25 ug/min via INTRAVENOUS

## 2020-06-10 SURGICAL SUPPLY — 30 items
BLADE SURG 15 STRL LF DISP TIS (BLADE) ×1 IMPLANT
BLADE SURG 15 STRL SS (BLADE) ×3
CEMENT KYPHON CX01A KIT/MIXER (Cement) ×6 IMPLANT
COVER WAND RF STERILE (DRAPES) ×3 IMPLANT
DERMABOND ADVANCED (GAUZE/BANDAGES/DRESSINGS) ×2
DERMABOND ADVANCED .7 DNX12 (GAUZE/BANDAGES/DRESSINGS) ×1 IMPLANT
DRAPE C-ARM 42X72 X-RAY (DRAPES) ×3 IMPLANT
DRAPE HALF SHEET 40X57 (DRAPES) ×3 IMPLANT
DRAPE LAPAROTOMY 100X72X124 (DRAPES) ×3 IMPLANT
DRAPE SURG 17X23 STRL (DRAPES) ×3 IMPLANT
DURAPREP 26ML APPLICATOR (WOUND CARE) ×3 IMPLANT
GAUZE 4X4 16PLY RFD (DISPOSABLE) ×3 IMPLANT
GLOVE ECLIPSE 6.5 STRL STRAW (GLOVE) ×3 IMPLANT
GOWN STRL REUS W/ TWL LRG LVL3 (GOWN DISPOSABLE) ×1 IMPLANT
GOWN STRL REUS W/ TWL XL LVL3 (GOWN DISPOSABLE) ×1 IMPLANT
GOWN STRL REUS W/TWL LRG LVL3 (GOWN DISPOSABLE) ×3
GOWN STRL REUS W/TWL XL LVL3 (GOWN DISPOSABLE) ×3
KIT BASIN OR (CUSTOM PROCEDURE TRAY) ×3 IMPLANT
KIT TURNOVER KIT B (KITS) ×3 IMPLANT
NEEDLE HYPO 25X1 1.5 SAFETY (NEEDLE) ×3 IMPLANT
NS IRRIG 1000ML POUR BTL (IV SOLUTION) ×3 IMPLANT
PACK SURGICAL SETUP 50X90 (CUSTOM PROCEDURE TRAY) ×3 IMPLANT
PAD ARMBOARD 7.5X6 YLW CONV (MISCELLANEOUS) ×9 IMPLANT
SPECIMEN JAR SMALL (MISCELLANEOUS) ×3 IMPLANT
STAPLER SKIN PROX WIDE 3.9 (STAPLE) ×3 IMPLANT
SUT VIC AB 3-0 SH 8-18 (SUTURE) ×3 IMPLANT
SYR CONTROL 10ML LL (SYRINGE) ×3 IMPLANT
TOWEL GREEN STERILE (TOWEL DISPOSABLE) ×3 IMPLANT
TOWEL GREEN STERILE FF (TOWEL DISPOSABLE) ×3 IMPLANT
TRAY KYPHOPAK 15/2 EXPRESS CDS (KITS) ×6 IMPLANT

## 2020-06-10 NOTE — H&P (Signed)
[name] is a 86 y.o. male With three acute to subacute lumbar and thoracic compression fractures at T12, L1, and L2.  He is admitted for a kyphoplasty at the affected levels. Allergies  Allergen Reactions  . Gabapentin     Lower extremity edema  . Lyrica [Pregabalin] Swelling   Past Medical History:  Diagnosis Date  . ACTINIC KERATOSIS, HEAD 03/23/2008  . ALLERGIC RHINITIS 03/27/2007  . Arthritis   . BENIGN PROSTATIC HYPERTROPHY, WITH URINARY OBSTRUCTION 10/16/2007  . CAD 08/18/2007   "patient denies any issues with heart, does not see cardiologist"  . Carpal tunnel syndrome, bilateral 09/04/2016  . CARPAL TUNNEL SYNDROME, LEFT 04/13/2008  . Chronic back pain   . Chronic neck pain   . Complication of anesthesia    left lower jaw teeth have been injured in the past and he has lost those, now has 2 loose front teeth due to a mouth injury on 01/02/20  . DVT (deep venous thrombosis) ([place])   . ERECTILE DYSFUNCTION, ORGANIC 02/20/2010  . Gout, unspecified 02/20/2010  . Headache(784.0)    migraines hx of  . History of melanoma   . HYPERLIPIDEMIA, WITH HIGH HDL 10/18/2008  . HYPERTENSION 05/09/2007   sees Dr. Benay Pillow  . KNEE PAIN 10/10/2009  . LOW BACK PAIN 03/27/2007  . NEOPLASM, MALIGNANT, SKIN, TRUNK 05/09/2007   squamous cell on scalp, sideburn and ear (right)  . Patellar tendinitis 10/10/2009  . Sciatic nerve pain    on Oxycodone as needed  . SKIN CANCER, HX OF 03/27/2007  . UNS ADVRS EFF UNS RX MEDICINAL&BIOLOGICAL SBSTNC 08/18/2007  . Urinary urgency    Past Surgical History:  Procedure Laterality Date  . APPENDECTOMY  1969  . APPLICATION OF WOUND VAC Left 01/19/2020   Procedure: Application Of Wound Vac;  Surgeon: [name], MD;  Location: [place];  Service: Orthopedics;  Laterality: Left;  . BACK SURGERY    . BLEPHAROPLASTY    . CARPAL TUNNEL RELEASE    . CHOLECYSTECTOMY    . ESOPHAGOGASTRODUODENOSCOPY (EGD) WITH PROPOFOL N/A 04/26/2015   Procedure:  ESOPHAGOGASTRODUODENOSCOPY (EGD) WITH PROPOFOL;  Surgeon: Inda Castle, MD;  Location: [facility];  Service: Endoscopy;  Laterality: N/A;  . EYE SURGERY     bilateral cataracts removed and vitrectomies  . JOINT REPLACEMENT Right    knee  . KNEE ARTHROSCOPY    . LAMINECTOMY    . LATERAL FUSION LUMBAR SPINE, TRANSVERSE    . LUMBAR LAMINECTOMY/DECOMPRESSION MICRODISCECTOMY Left 11/03/2012   Procedure: LUMBAR LAMINECTOMY/DECOMPRESSION MICRODISCECTOMY 1 LEVEL;  Surgeon: Hosie Spangle, MD;  Location: [place] [facility];  Service: Neurosurgery;  Laterality: Left;  LEFT L5S1 laminotomy foraminotomy and possible microdiskectomy  . MELANOMA EXCISION    . NASAL SINUS SURGERY    . ORIF PATELLA Left 01/19/2020   Procedure: LEFT OPEN REDUCTION INTERNAL (ORIF) FIXATION PATELLA WITH AUTO GRAFTING USING HAMSTRING REINFORCEMENT;  Surgeon: [name], MD;  Location: [place];  Service: Orthopedics;  Laterality: Left;  . POSTERIOR CERVICAL FUSION/FORAMINOTOMY Right 10/01/2012   Procedure: POSTERIOR CERVICAL FUSION/FORAMINOTOMY LEVEL 1;  Surgeon: Hosie Spangle, MD;  Location: [place] [facility];  Service: Neurosurgery;  Laterality: Right;  Right Cervical seven thoracic one Cervical laminectomy/foraminotomy with posterior cervical arthrodesis   . POSTERIOR LUMBAR FUSION N/A 05/23/2013   Procedure: exploration of lumbar wound, explantation of left interbody implant.;  Surgeon: Hosie Spangle, MD;  Location: [place];  Service: Neurosurgery;  Laterality: N/A;  . RECTAL SURGERY  Fissure  . TOTAL KNEE ARTHROPLASTY Left 05/07/2019   Procedure: LEFT TOTAL KNEE ARTHROPLASTY;  Surgeon: [name], MD;  Location: [place];  Service: Orthopedics;  Laterality: Left;  Marland Kitchen VITRECTOMY     right and left 2013   Prior to Admission medications   Medication Sig Start Date End Date Taking? Authorizing Provider  amLODipine (NORVASC) 10 MG tablet TAKE 1 TABLET BY MOUTH EVERY DAY Patient taking differently: Take 10 mg by  mouth daily.  01/13/20  Yes Hoyt Koch, MD  carvedilol (COREG) 25 MG tablet TAKE 1 TABLET BY MOUTH 2 TIMES DAILY WITH A MEAL Patient taking differently: Take 25 mg by mouth 2 (two) times daily with a meal.  02/26/20  Yes Hoyt Koch, MD  fluticasone [facility]) 50 MCG/ACT nasal spray Place 1 spray into both nostrils daily as needed for allergies.    Yes [provider]  furosemide (LASIX) 20 MG tablet TAKE 1 TABLET (20 MG TOTAL) BY MOUTH 2 (TWO) TIMES DAILY AS NEEDED FOR FLUID OR EDEMA. 05/30/20  Yes Marrian Salvage, FNP  hydrALAZINE (APRESOLINE) 50 MG tablet TAKE 1 TABLET BY MOUTH TWICE A DAY Patient taking differently: Take 50 mg by mouth 2 (two) times daily.  03/21/20  Yes Hoyt Koch, MD  Oxycodone HCl 10 MG TABS Take 1 tablet (10 mg total) by mouth every 4 (four) hours as needed (pain score 4-6). Patient taking differently: Take 10 mg by mouth 5 (five) times daily as needed (pain score 4-6).  06/01/19  Yes Magnant, Charles L, PA-C  predniSONE (DELTASONE) 10 MG tablet TAKE 1-4 TABLETS (10-40 MG TOTAL) BY MOUTH DAILY WITH BREAKFAST. 06/10/20  Yes Hoyt Koch, MD  spironolactone (ALDACTONE) 25 MG tablet TAKE 1 TABLET BY MOUTH EVERY DAY Patient taking differently: Take 25 mg by mouth daily.  04/05/20  Yes Hoyt Koch, MD  tamsulosin (FLOMAX) 0.4 MG CAPS capsule TAKE 1 CAPSULE BY MOUTH EVERY DAY Patient taking differently: Take 0.4 mg by mouth daily.  01/18/20  Yes Hoyt Koch, MD  aspirin 81 MG chewable tablet Chew 1 tablet (81 mg total) by mouth daily. Patient not taking: Reported on 06/08/2020 01/20/20   Magnant, Gerrianne Scale, PA-C  [facility] MAGNESIUM OXIDE 250 MG TABS TAKE 1 TABLET BY MOUTH EVERY DAY Patient not taking: Reported on 06/08/2020 08/04/19   Jessy Oto, MD  magnesium citrate SOLN Take 0.5 Bottles by mouth daily as needed for severe constipation.    [provider]  methocarbamol (ROBAXIN) 500 MG tablet Take 1  tablet (500 mg total) by mouth every 8 (eight) hours as needed for muscle spasms. Patient not taking: Reported on 06/08/2020 01/20/20   Magnant, Charles L, PA-C  ondansetron (ZOFRAN ODT) 8 MG disintegrating tablet Take 1 tablet (8 mg total) by mouth every 8 (eight) hours as needed for nausea or vomiting. Patient not taking: Reported on 06/08/2020 04/29/20   Marrian Salvage, FNP  polyethylene glycol powder [facility]) 17 GM/SCOOP powder Take 17 g by mouth 2 (two) times daily as needed. Patient not taking: Reported on 06/08/2020 04/15/20   Marrian Salvage, FNP  triamcinolone cream (KENALOG) 0.5 % APPLY 1 APPLICATION TOPICALLY 2 (TWO) TIMES DAILY AS NEEDED (DRY SKIN, ITCHING). 08/19/19   Hoyt Koch, MD   Family History  Problem Relation Age of Onset  . Dementia Mother   . Anemia Father   . Heart disease Father   . Stroke Sister  ICH  . Lung disease Sister    Social History   Socioeconomic History  . Marital status: Married    Spouse name: Not on file  . Number of children: 3  . Years of education: 48  . Highest education level: Not on file  Occupational History  . Occupation: Retired  Tobacco Use  . Smoking status: Former Smoker    Packs/day: 1.00    Years: 33.00    Pack years: 33.00    Quit date: 09/29/1981    Years since quitting: 38.7  . Smokeless tobacco: Never Used  Vaping Use  . Vaping Use: Never used  Substance and Sexual Activity  . Alcohol use: Yes    Alcohol/week: 3.0 standard drinks    Types: 3 Standard drinks or equivalent per week    Comment: Social  . Drug use: No  . Sexual activity: Yes    Partners: Female    Comment: Married  Other Topics Concern  . Not on file  Social History Narrative   Lives at home w/ his wife   Left-handed   Caffeine: 1 cup coffee daily   1 son alive, 2 sons deceased   Retired Building control surveyor, Furniture conservator/restorer.    Education: GED   Social Determinants of Radio broadcast assistant Strain:   . Difficulty of  Paying Living Expenses: Not on file  Food Insecurity:   . Worried About Charity fundraiser in the Last Year: Not on file  . Ran Out of Food in the Last Year: Not on file  Transportation Needs:   . Lack of Transportation (Medical): Not on file  . Lack of Transportation (Non-Medical): Not on file  Physical Activity:   . Days of Exercise per Week: Not on file  . Minutes of Exercise per Session: Not on file  Stress:   . Feeling of Stress : Not on file  Social Connections:   . Frequency of Communication with Friends and Family: Not on file  . Frequency of Social Gatherings with Friends and Family: Not on file  . Attends Religious Services: Not on file  . Active Member of Clubs or Organizations: Not on file  . Attends Archivist Meetings: Not on file  . Marital Status: Not on file  Intimate Partner Violence:   . Fear of Current or Ex-Partner: Not on file  . Emotionally Abused: Not on file  . Physically Abused: Not on file  . Sexually Abused: Not on file   Physical Exam Constitutional:      General: He is in acute distress.     Appearance: Normal appearance. He is normal weight.  HENT:     Head: Normocephalic and atraumatic.     Nose: Nose normal.     Mouth/Throat:     Mouth: Mucous membranes are moist.     Pharynx: Oropharynx is clear.  Eyes:     Extraocular Movements: Extraocular movements intact.     Pupils: Pupils are equal, round, and reactive to light.  Cardiovascular:     Rate and Rhythm: Normal rate and regular rhythm.  Pulmonary:     Effort: Pulmonary effort is normal.     Breath sounds: Normal breath sounds.  Abdominal:     General: Abdomen is flat.     Palpations: Abdomen is soft.  Musculoskeletal:        General: Normal range of motion.     Cervical back: Normal range of motion and neck supple.  Skin:    General: Skin  is warm and dry.  Neurological:     General: No focal deficit present.     Mental Status: He is alert and oriented to person, place,  and time. Mental status is at baseline.     Cranial Nerves: No cranial nerve deficit.     Sensory: No sensory deficit.     Motor: No weakness.     Coordination: Coordination normal.     Gait: Gait abnormal.     Deep Tendon Reflexes: Reflexes normal.  Psychiatric:        Mood and Affect: Mood normal.        Behavior: Behavior normal.        Thought Content: Thought content normal.        Judgment: Judgment normal.   admit for kyphoplasty. Risks including but not limited to bleeding, infection, weakness, spinal cord damage, continued pain, vascular problems, dvt, nerve damage, bowel and or bladder dysfunction, and other risks were discussed. He understands and wishes to proceed.

## 2020-06-10 NOTE — Anesthesia Procedure Notes (Signed)
Procedure Name: Intubation Date/Time: 06/10/2020 2:45 PM Performed by: Clearnce Sorrel, CRNA Pre-anesthesia Checklist: Patient identified, Emergency Drugs available, Suction available, Patient being monitored and Timeout performed Patient Re-evaluated:Patient Re-evaluated prior to induction Oxygen Delivery Method: Circle system utilized Preoxygenation: Pre-oxygenation with 100% oxygen Induction Type: IV induction Ventilation: Mask ventilation without difficulty and Oral airway inserted - appropriate to patient size Laryngoscope Size: Mac and 4 Grade View: Grade I Tube type: Oral Tube size: 7.5 mm Number of attempts: 1 Airway Equipment and Method: Stylet Placement Confirmation: ETT inserted through vocal cords under direct vision,  positive ETCO2 and breath sounds checked- equal and bilateral Secured at: 23 cm Tube secured with: Tape Dental Injury: Teeth and Oropharynx as per pre-operative assessment

## 2020-06-10 NOTE — Anesthesia Preprocedure Evaluation (Addendum)
Anesthesia Evaluation  Patient identified by MRN, date of birth, ID band Patient awake  General Assessment Comment:Hx/o tooth damage   Reviewed: Allergy & Precautions, NPO status , Patient's Chart, lab work & pertinent test results, Unable to perform ROS - Chart review only  History of Anesthesia Complications (+) history of anesthetic complications  Airway Mallampati: II  TM Distance: >3 FB Neck ROM: Full    Dental  (+) Missing, Dental Advisory Given, Chipped,  Loose central incisors:   Pulmonary former smoker,    breath sounds clear to auscultation       Cardiovascular hypertension, Pt. on medications and Pt. on home beta blockers + CAD and +CHF   Rhythm:Regular Rate:Normal  Echo 08/10/18 Left ventricle: The cavity size was mildly dilated. Wall thickness was increased in a pattern of mild LVH. Systolicfunction was normal. The estimated ejection fraction was in therange of 55% to 60%. Doppler parameters are consistent withabnormal left ventricular relaxation (grade 1 diastolicdysfunction).  - Aortic valve: Valve area (VTI): 3.24 cm^2. Valve area (Vmax): 3.01 cm^2. Valve area (Vmean): 3.18 cm^2.  - Mitral valve: There was mild regurgitation.  - Left atrium: The atrium was mildly dilated.  - Pericardium, extracardiac: A trivial pericardial effusion was identified.    Neuro/Psych  Headaches,  Neuromuscular disease negative psych ROS   GI/Hepatic negative GI ROS, Neg liver ROS,   Endo/Other  Hyperlipidemia Gout  Renal/GU negative Renal ROS   ED BPH    Musculoskeletal  (+) Arthritis , Osteoarthritis,  Fx left patella   Abdominal   Peds  Hematology negative hematology ROS (+) anemia ,   Anesthesia Other Findings   Reproductive/Obstetrics                            Anesthesia Physical  Anesthesia Plan  ASA: III  Anesthesia Plan: General   Post-op Pain Management:    Induction:  Intravenous  PONV Risk Score and Plan: 3 and Ondansetron, Treatment may vary due to age or medical condition and Dexamethasone  Airway Management Planned: Oral ETT  Additional Equipment: None  Intra-op Plan:   Post-operative Plan: Extubation in OR  Informed Consent: I have reviewed the patients History and Physical, chart, labs and discussed the procedure including the risks, benefits and alternatives for the proposed anesthesia with the patient or authorized representative who has indicated his/her understanding and acceptance.     Dental advisory given  Plan Discussed with: CRNA  Anesthesia Plan Comments:        Anesthesia Quick Evaluation

## 2020-06-10 NOTE — Discharge Instructions (Addendum)
Care After ?These instructions give you information on caring for yourself after your procedure. Your doctor may also give you more specific instructions. Call your doctor if you have any problems or questions after your procedure. ?HOME CARE ?Take medicine as told by your doctor. ?Keep your wound covered for 24 hours or as told by your doctor. ?Ask your doctor when you can bathe or shower. ?Put ice on your wound if it helps your pain. ?Put ice in a plastic bag. ?Place a towel between your skin and the bag. ?Leave the ice on for 15 to 20 minutes, 3 to 4 times a day. ? ?Return to normal activities as told by your doctor. ?Ask your doctor what stretches and exercises you can do. ?Do not bend or lift anything heavy as told by your doctor. ?GET HELP RIGHT AWAY IF:  ?You have bad back pain that comes on suddenly. ?You cannot control when you pee (urinate) or poop (bowel movement). ?You lose feeling (numbness) in your legs or feet, or they become weak. ?You have shooting pain down your legs. ?You have a fever. ?Your wound becomes red, puffy (swollen), or tender to the touch. ?You are bleeding or leaking fluid from the wound. ?You are sick to your stomach (nauseous) or throw up (vomit) for more than 24 hours after the procedure. ?Your back pain does not get better. ?MAKE SURE YOU: ?Understand these instructions. ?Will watch your condition. ?Will get help right away if you are not doing well or get worse. ?Document Released: 10/24/2009 Document Revised: 10/22/2011 Document Reviewed: 01/03/2011 ?ExitCare? Patient Information ?2013 [employer]  ?

## 2020-06-10 NOTE — Op Note (Signed)
06/10/2020  4:50 PM  PATIENT:  [name]  86 y.o. male  PRE-OPERATIVE DIAGNOSIS:  Compression fracture T12,L1,2  POST-OPERATIVE DIAGNOSIS:  Compression fracture T12, L1,2  PROCEDURE:  Procedure(s): Thoracic Twelve, Lumbar One, Lumbar Two Kyphoplasty  SURGEON:  Surgeon(s): Ashok Pall, MD  ANESTHESIA:   local and general  EBL:  Total I/O In: 700 [I.V.:700] Out: 10 [Blood:10]  BLOOD ADMINISTERED:none  COUNT:per nursing  SPECIMEN:  No Specimen  DICTATION: Mr. Watchman was taken to the operating room, intubated and placed under a general anesthetic without difficulty. He was positioned prone on the operating room table with all pressure points properly padded. Her back was prepped and draped in a sterile manner. With fluoroscopy I localized the T12, L1, and L2 pedicles bilaterally. I injected lidocaine into the entry sites on both the left and right sides. I started by making a stab incision on the left at each level side and entering the Left pedicles with fluoroscopic guidance. I did the same on the right side. Once good position was obtained, I drilled into the vertebral body. I then placed the kyphoplasty balloon into the named vertebra and inflated the balloons. I then inserted methylmethacrylate into the vertebral body under fluoroscopic guidance. I achieved a good fill of the cavity in each body, staying within the confines of the vertebral body. I removed the instrumentation from the vertebral body, and the final films looked good.the same procedure was performed at each level.  I closed the stab incisions with vicryl suture and used Dermabond for a sterile dressing. Marland Kitchen    PLAN OF CARE: Admit to inpatient   PATIENT DISPOSITION:  PACU - hemodynamically stable.   Delay start of Pharmacological VTE agent (>24hrs) due to surgical blood loss or risk of bleeding:  yes

## 2020-06-10 NOTE — Anesthesia Postprocedure Evaluation (Signed)
Anesthesia Post Note  Patient: [name]  Procedure(s) Performed: Thoracic Twelve, Lumbar One, Lumbar Two Kyphoplasty (N/A Back)     Patient location during evaluation: PACU Anesthesia Type: General Level of consciousness: awake Pain management: pain level controlled Vital Signs Assessment: post-procedure vital signs reviewed and stable Respiratory status: spontaneous breathing, nonlabored ventilation, respiratory function stable and patient connected to nasal cannula oxygen Cardiovascular status: blood pressure returned to baseline and stable Postop Assessment: no apparent nausea or vomiting Anesthetic complications: no   No complications documented.  Last Vitals:  Vitals:   06/10/20 1745 06/10/20 1750  BP: 127/67   Pulse: 77   Resp: 11   Temp: (!) 36.1 C   SpO2: 90% 90%    Last Pain:  Vitals:   06/10/20 1745  TempSrc:   PainSc: 3                  Koa Zoeller P Kahil Agner

## 2020-06-10 NOTE — Transfer of Care (Signed)
Immediate Anesthesia Transfer of Care Note  Patient: [name]  Procedure(s) Performed: Thoracic Twelve, Lumbar One, Lumbar Two Kyphoplasty (N/A Back)  Patient Location: PACU  Anesthesia Type:General  Level of Consciousness: awake, alert  and oriented  Airway & Oxygen Therapy: Patient Spontanous Breathing and Patient connected to nasal cannula oxygen  Post-op Assessment: Report given to RN and Post -op Vital signs reviewed and stable  Post vital signs: Reviewed and stable  Last Vitals:  Vitals Value Taken Time  BP 176/78 06/10/20 1645  Temp    Pulse 72 06/10/20 1651  Resp 15 06/10/20 1651  SpO2 91 % 06/10/20 1651  Vitals shown include unvalidated device data.  Last Pain:  Vitals:   06/10/20 1212  TempSrc: Oral  PainSc: 5       Patients Stated Pain Goal: 3 (17/98/10 2548)  Complications: No complications documented.

## 2020-06-13 ENCOUNTER — Encounter (HOSPITAL_COMMUNITY): Payer: Self-pay | Admitting: Neurosurgery

## 2020-06-23 DIAGNOSIS — M5136 Other intervertebral disc degeneration, lumbar region: Secondary | ICD-10-CM | POA: Diagnosis not present

## 2020-06-23 DIAGNOSIS — M5416 Radiculopathy, lumbar region: Secondary | ICD-10-CM | POA: Diagnosis not present

## 2020-06-27 ENCOUNTER — Other Ambulatory Visit: Payer: Self-pay | Admitting: Internal Medicine

## 2020-06-28 DIAGNOSIS — R2689 Other abnormalities of gait and mobility: Secondary | ICD-10-CM | POA: Diagnosis not present

## 2020-06-28 DIAGNOSIS — S32020A Wedge compression fracture of second lumbar vertebra, initial encounter for closed fracture: Secondary | ICD-10-CM | POA: Diagnosis not present

## 2020-06-30 ENCOUNTER — Other Ambulatory Visit: Payer: Self-pay | Admitting: Neurosurgery

## 2020-06-30 DIAGNOSIS — R2689 Other abnormalities of gait and mobility: Secondary | ICD-10-CM

## 2020-07-04 ENCOUNTER — Other Ambulatory Visit: Payer: Self-pay | Admitting: Internal Medicine

## 2020-07-05 ENCOUNTER — Other Ambulatory Visit: Payer: Self-pay | Admitting: Family

## 2020-07-05 NOTE — Telephone Encounter (Signed)
Please advise 

## 2020-07-08 ENCOUNTER — Other Ambulatory Visit: Payer: Self-pay | Admitting: Internal Medicine

## 2020-07-11 NOTE — Telephone Encounter (Signed)
    Patient calling for status of refill on Tamsulosin HCl TAKE 1 CAPSULE BY MOUTH EVERY DAY Patient has no medication remaining

## 2020-07-13 ENCOUNTER — Ambulatory Visit
Admission: RE | Admit: 2020-07-13 | Discharge: 2020-07-13 | Disposition: A | Discharge status: Home / Self Care | Payer: Medicare Other | Source: Ambulatory Visit | Attending: Neurosurgery | Admitting: Neurosurgery

## 2020-07-13 DIAGNOSIS — R2681 Unsteadiness on feet: Secondary | ICD-10-CM | POA: Diagnosis not present

## 2020-07-13 DIAGNOSIS — R2689 Other abnormalities of gait and mobility: Secondary | ICD-10-CM

## 2020-07-19 DIAGNOSIS — R2689 Other abnormalities of gait and mobility: Secondary | ICD-10-CM | POA: Diagnosis not present

## 2020-07-19 DIAGNOSIS — S32020A Wedge compression fracture of second lumbar vertebra, initial encounter for closed fracture: Secondary | ICD-10-CM | POA: Diagnosis not present

## 2020-07-19 DIAGNOSIS — I10 Essential (primary) hypertension: Secondary | ICD-10-CM | POA: Diagnosis not present

## 2020-07-19 DIAGNOSIS — Z6826 Body mass index (BMI) 26.0-26.9, adult: Secondary | ICD-10-CM | POA: Diagnosis not present

## 2020-07-21 DIAGNOSIS — M5416 Radiculopathy, lumbar region: Secondary | ICD-10-CM | POA: Diagnosis not present

## 2020-07-21 DIAGNOSIS — M5136 Other intervertebral disc degeneration, lumbar region: Secondary | ICD-10-CM | POA: Diagnosis not present

## 2020-07-22 ENCOUNTER — Other Ambulatory Visit: Payer: Self-pay

## 2020-07-22 ENCOUNTER — Ambulatory Visit (INDEPENDENT_AMBULATORY_CARE_PROVIDER_SITE_OTHER): Payer: Medicare Other | Admitting: Internal Medicine

## 2020-07-22 ENCOUNTER — Encounter: Payer: Self-pay | Admitting: Internal Medicine

## 2020-07-22 VITALS — BP 122/70 | HR 96 | Temp 98.3°F | Ht 72.0 in | Wt 204.0 lb

## 2020-07-22 DIAGNOSIS — G8929 Other chronic pain: Secondary | ICD-10-CM | POA: Diagnosis not present

## 2020-07-22 DIAGNOSIS — M549 Dorsalgia, unspecified: Secondary | ICD-10-CM | POA: Diagnosis not present

## 2020-07-22 DIAGNOSIS — M5416 Radiculopathy, lumbar region: Secondary | ICD-10-CM

## 2020-07-22 MED ORDER — KETOROLAC TROMETHAMINE 30 MG/ML IJ SOLN
30.0000 mg | Freq: Once | INTRAMUSCULAR | Status: AC
  Administered 2020-07-22: 30 mg via INTRAVENOUS

## 2020-07-22 MED ORDER — METHYLPREDNISOLONE ACETATE 40 MG/ML IJ SUSP
40.0000 mg | Freq: Once | INTRAMUSCULAR | Status: AC
  Administered 2020-07-22: 40 mg via INTRAMUSCULAR

## 2020-07-22 NOTE — Progress Notes (Signed)
   Subjective:   Patient ID: [name], male    DOB: [birth year], 86 y.o.   MRN: [id number]  HPI The patient is an 86 YO man coming in for concerns about ongoing and worsening back pain. He is taking oxycodone from neurosurgeon which is helping but only slightly and for short amounts of time. He is taking prednisone chronically for pain relief and did have compression fractures in his back and kyphoplasty back in October. This did not provide relief from the pain. This is significantly limiting his QOL.   Review of Systems  Constitutional: Positive for activity change. Negative for appetite change, fatigue, fever and unexpected weight change.  Respiratory: Negative.   Cardiovascular: Negative.   Musculoskeletal: Positive for back pain and myalgias. Negative for arthralgias.  Skin: Negative.   Neurological: Negative for syncope, weakness and numbness.    Objective:  Physical Exam Constitutional:      Appearance: He is well-developed and well-nourished.  HENT:     Head: Normocephalic and atraumatic.  Eyes:     Extraocular Movements: EOM normal.  Cardiovascular:     Rate and Rhythm: Normal rate and regular rhythm.  Pulmonary:     Effort: Pulmonary effort is normal. No respiratory distress.     Breath sounds: Normal breath sounds. No wheezing or rales.  Abdominal:     General: Bowel sounds are normal. There is no distension.     Palpations: Abdomen is soft.     Tenderness: There is no abdominal tenderness. There is no rebound.  Musculoskeletal:        General: Tenderness present. No edema.     Cervical back: Normal range of motion.  Skin:    General: Skin is warm and dry.  Neurological:     Mental Status: He is alert and oriented to person, place, and time.     Coordination: Coordination normal.  Psychiatric:        Mood and Affect: Mood and affect normal.     Vitals:   07/22/20 1443  BP: 122/70  Pulse: 96  Temp: 98.3 F (36.8 C)  TempSrc: Oral  SpO2: 96%  Weight:  204 lb (92.5 kg)  Height: 6' (1.829 m)    This visit occurred during the SARS-CoV-2 public health emergency.  Safety protocols were in place, including screening questions prior to the visit, additional usage of staff PPE, and extensive cleaning of exam room while observing appropriate contact time as indicated for disinfecting solutions.   Assessment & Plan:  Depo-medrol 40 mg IM and toradol 30 mg IM

## 2020-07-22 NOTE — Assessment & Plan Note (Signed)
Given toradol 30 mg IM and depo-medrol 40 mg IM. He is taking prednisone and oxycodone. Has taken gabapentin and lyrica with side effects which limited usage.

## 2020-07-22 NOTE — Patient Instructions (Signed)
We have given you a shot today which will hopefully help the back. Let us know how you are doing.

## 2020-07-25 ENCOUNTER — Encounter: Payer: Self-pay | Admitting: Internal Medicine

## 2020-07-25 ENCOUNTER — Other Ambulatory Visit: Payer: Self-pay

## 2020-07-25 ENCOUNTER — Ambulatory Visit (INDEPENDENT_AMBULATORY_CARE_PROVIDER_SITE_OTHER): Payer: Medicare Other | Admitting: Internal Medicine

## 2020-07-25 VITALS — BP 124/68 | HR 102 | Temp 98.9°F | Ht 72.0 in | Wt 207.8 lb

## 2020-07-25 DIAGNOSIS — M7989 Other specified soft tissue disorders: Secondary | ICD-10-CM

## 2020-07-25 DIAGNOSIS — G8929 Other chronic pain: Secondary | ICD-10-CM | POA: Diagnosis not present

## 2020-07-25 DIAGNOSIS — M5416 Radiculopathy, lumbar region: Secondary | ICD-10-CM | POA: Diagnosis not present

## 2020-07-25 DIAGNOSIS — Z23 Encounter for immunization: Secondary | ICD-10-CM | POA: Diagnosis not present

## 2020-07-25 MED ORDER — KETOROLAC TROMETHAMINE 30 MG/ML IJ SOLN
30.0000 mg | Freq: Once | INTRAMUSCULAR | Status: AC
  Administered 2020-07-25: 17:00:00 30 mg via INTRAMUSCULAR

## 2020-07-25 MED ORDER — KETOROLAC TROMETHAMINE 15.75 MG/SPRAY NA SOLN: 1.0000 | Freq: Three times a day (TID) | NASAL | 1 refills | Status: DC | PRN

## 2020-07-25 MED ORDER — TORSEMIDE 20 MG PO TABS
20.0000 mg | ORAL_TABLET | Freq: Every day | ORAL | 6 refills | Status: DC
Start: 2020-07-25 — End: 2020-10-11

## 2020-07-25 NOTE — Progress Notes (Signed)
[id number]

## 2020-07-25 NOTE — Patient Instructions (Signed)
We have given you an injection today for the pain. We have sent in the same medicine in a nose spray to use 1 spray up to 3 times a day to use at home.  We have sent in a different fluid pill called torsemide to replace the lasix (furosemide) to take 1-2 pills daily for the fluid.

## 2020-07-26 ENCOUNTER — Telehealth: Payer: Self-pay | Admitting: Internal Medicine

## 2020-07-26 NOTE — Assessment & Plan Note (Signed)
Given toradol 30 mg IM at visit and rx for nasal toradol to use prn. Taking oxycodone from other provider and will not adjust. Has had previous reaction and lack of benefit with gabapentin and lyrica.

## 2020-07-26 NOTE — Assessment & Plan Note (Signed)
Will switch lasix to torsemide to see if this provides benefit. Advised not to increase dosing on his own.

## 2020-07-26 NOTE — Progress Notes (Signed)
   Subjective:   Patient ID: JMARI PELC, male    DOB: Aug 07, 1932, 86 y.o.   MRN: [id number]  HPI The patient is an 86 YO man coming in for concerns about ongoing leg pain and weakness. Compression fractures lumbar in the last several months. Is s/p kyphoplasty which did not relieve the pain. He is in miserable pain and getting oxycodone for pain from pain management. Has been on chronic prednisone for some time as it was the only thing that allowed him to function. He is caregiver for his wife. He had discussed the side effects of bone loss from prednisone on numerous occasions. He is concerned about leg swelling at this time. Does take lasix regularly and has recently decided to increase dose to 7 pills yesterday and still feels that this did not help with urinating more often or losing fluid from his legs.   Review of Systems  Constitutional: Negative.   HENT: Negative.   Eyes: Negative.   Respiratory: Negative for cough, chest tightness and shortness of breath.   Cardiovascular: Negative for chest pain, palpitations and leg swelling.  Gastrointestinal: Negative for abdominal distention, abdominal pain, constipation, diarrhea, nausea and vomiting.  Musculoskeletal: Negative.   Skin: Negative.   Neurological: Negative.   Psychiatric/Behavioral: Negative.     Objective:  Physical Exam Constitutional:      Appearance: He is well-developed and well-nourished.  HENT:     Head: Normocephalic and atraumatic.  Eyes:     Extraocular Movements: EOM normal.  Cardiovascular:     Rate and Rhythm: Normal rate and regular rhythm.  Pulmonary:     Effort: Pulmonary effort is normal. No respiratory distress.     Breath sounds: Normal breath sounds. No wheezing or rales.  Abdominal:     General: Bowel sounds are normal. There is no distension.     Palpations: Abdomen is soft.     Tenderness: There is no abdominal tenderness. There is no rebound.  Musculoskeletal:        General: Tenderness  present.     Cervical back: Normal range of motion.     Right lower leg: Edema present.     Left lower leg: Edema present.  Skin:    General: Skin is warm and dry.  Neurological:     Mental Status: He is alert and oriented to person, place, and time.     Coordination: Coordination abnormal.     Comments: Walker for ambulation  Psychiatric:        Mood and Affect: Mood and affect normal.     Vitals:   07/25/20 1552  BP: 124/68  Pulse: (!) 102  Temp: 98.9 F (37.2 C)  TempSrc: Oral  SpO2: 95%  Weight: 207 lb 12.8 oz (94.3 kg)  Height: 6' (1.829 m)    This visit occurred during the SARS-CoV-2 public health emergency.  Safety protocols were in place, including screening questions prior to the visit, additional usage of staff PPE, and extensive cleaning of exam room while observing appropriate contact time as indicated for disinfecting solutions.   Assessment & Plan:  Flu shot given at visit Toradol 30 mg IM given at visit

## 2020-07-26 NOTE — Telephone Encounter (Signed)
Patient called and said that Ketorolac Tromethamine 15.75 MG/SPRAY SOLN  Was not covered under his insurance and was $900 he was wondering if Dr. Sharlet Salina would call him something else in to [id number] - [place], [place].

## 2020-07-27 ENCOUNTER — Telehealth: Payer: Self-pay | Admitting: Internal Medicine

## 2020-07-27 NOTE — Telephone Encounter (Addendum)
    Patient calling to report torsemide (DEMADEX) 20 MG tablet is not helping with swelling. Patient has swelling from the knee down

## 2020-07-28 NOTE — Telephone Encounter (Signed)
Unfortunately there are not a lot of other options which are similar to this medication. He can try over the counter lidocaine patches to see if these help instead.

## 2020-07-28 NOTE — Telephone Encounter (Signed)
Pt informed of below.  He states his symptoms are bearable at this time and he will contact us if he needs further advice.

## 2020-07-28 NOTE — Telephone Encounter (Signed)
Is the torsemide causing him to urinate more often?

## 2020-07-29 ENCOUNTER — Ambulatory Visit (INDEPENDENT_AMBULATORY_CARE_PROVIDER_SITE_OTHER): Payer: Medicare Other | Admitting: Internal Medicine

## 2020-07-29 ENCOUNTER — Encounter: Payer: Self-pay | Admitting: Internal Medicine

## 2020-07-29 ENCOUNTER — Other Ambulatory Visit: Payer: Self-pay

## 2020-07-29 VITALS — BP 114/78 | HR 88 | Temp 98.5°F | Ht 72.0 in | Wt 200.0 lb

## 2020-07-29 DIAGNOSIS — G8929 Other chronic pain: Secondary | ICD-10-CM | POA: Diagnosis not present

## 2020-07-29 DIAGNOSIS — M5416 Radiculopathy, lumbar region: Secondary | ICD-10-CM | POA: Diagnosis not present

## 2020-07-29 DIAGNOSIS — I10 Essential (primary) hypertension: Secondary | ICD-10-CM | POA: Diagnosis not present

## 2020-07-29 DIAGNOSIS — R0600 Dyspnea, unspecified: Secondary | ICD-10-CM

## 2020-07-29 LAB — CBC
HCT: 36.3 % — ABNORMAL LOW (ref 39.0–52.0)
Hemoglobin: 12.3 g/dL — ABNORMAL LOW (ref 13.0–17.0)
MCHC: 33.9 g/dL (ref 30.0–36.0)
MCV: 87.6 fl (ref 78.0–100.0)
Platelets: 447 10*3/uL — ABNORMAL HIGH (ref 150.0–400.0)
RBC: 4.14 Mil/uL — ABNORMAL LOW (ref 4.22–5.81)
RDW: 15.8 % — ABNORMAL HIGH (ref 11.5–15.5)
WBC: 10.3 10*3/uL (ref 4.0–10.5)

## 2020-07-29 LAB — COMPREHENSIVE METABOLIC PANEL
ALT: 15 U/L (ref 0–53)
AST: 14 U/L (ref 0–37)
Albumin: 3.9 g/dL (ref 3.5–5.2)
Alkaline Phosphatase: 92 U/L (ref 39–117)
BUN: 50 mg/dL — ABNORMAL HIGH (ref 6–23)
CO2: 28 mEq/L (ref 19–32)
Calcium: 9 mg/dL (ref 8.4–10.5)
Chloride: 93 mEq/L — ABNORMAL LOW (ref 96–112)
Creatinine, Ser: 1.65 mg/dL — ABNORMAL HIGH (ref 0.40–1.50)
GFR: 37.25 mL/min — ABNORMAL LOW (ref >60.00)
Glucose, Bld: 147 mg/dL — ABNORMAL HIGH (ref 70–99)
Potassium: 3.5 mEq/L (ref 3.5–5.1)
Sodium: 133 mEq/L — ABNORMAL LOW (ref 135–145)
Total Bilirubin: 0.5 mg/dL (ref 0.2–1.2)
Total Protein: 6.7 g/dL (ref 6.0–8.3)

## 2020-07-29 LAB — BRAIN NATRIURETIC PEPTIDE: Pro B Natriuretic peptide (BNP): 222 pg/mL — ABNORMAL HIGH (ref 0.0–100.0)

## 2020-07-29 MED ORDER — PREGABALIN 75 MG PO CAPS: 75.0000 mg | ORAL_CAPSULE | Freq: Two times a day (BID) | ORAL | 0 refills | Status: DC

## 2020-07-29 MED ORDER — KETOROLAC TROMETHAMINE 30 MG/ML IJ SOLN: 30.0000 mg | Freq: Once | INTRAMUSCULAR | Status: DC

## 2020-07-29 MED ORDER — KETOROLAC TROMETHAMINE 30 MG/ML IJ SOLN
30.0000 mg | Freq: Once | INTRAMUSCULAR | Status: AC
  Administered 2020-07-29: 15:00:00 30 mg via INTRAMUSCULAR

## 2020-07-29 NOTE — Patient Instructions (Signed)
We have sent in the lyrica for the pain to take up to twice a day.   We are checking the labs and have given you the toradol shot to help with the pain.

## 2020-07-29 NOTE — Progress Notes (Signed)
   Subjective:   Patient ID: [name], male    DOB: 01-Mar-1932, 86 y.o.   MRN: [id number]  HPI The patient is an 86 YO man coming in for ongoing problems with low back and hip/knee pain. He has been taking torsemide for the swelling and it seems that they are a little less swollen. He did not feel he urinated well after taking even 3 pills of the torsemide. He was hoping that this would help with heaviness and weakness in his leg. Recent compression fracture lumbar s/p kyphoplasty. He does not feel that this helped for pain. Does well with toradol injections IM and is wanting something to try for relief at home.   Review of Systems  Constitutional: Positive for activity change. Negative for appetite change, fatigue, fever and unexpected weight change.  Respiratory: Negative.   Cardiovascular: Negative.   Musculoskeletal: Positive for back pain and myalgias. Negative for arthralgias.  Skin: Negative.   Neurological: Negative for syncope, weakness and numbness.    Objective:  Physical Exam Constitutional:      Appearance: He is well-developed and well-nourished.  HENT:     Head: Normocephalic and atraumatic.  Eyes:     Extraocular Movements: EOM normal.  Cardiovascular:     Rate and Rhythm: Normal rate and regular rhythm.  Pulmonary:     Effort: Pulmonary effort is normal. No respiratory distress.     Breath sounds: Normal breath sounds. No wheezing or rales.  Abdominal:     General: Bowel sounds are normal. There is no distension.     Palpations: Abdomen is soft.     Tenderness: There is no abdominal tenderness. There is no rebound.  Musculoskeletal:        General: Tenderness present. No edema.     Cervical back: Normal range of motion.  Skin:    General: Skin is warm and dry.  Neurological:     Mental Status: He is alert and oriented to person, place, and time.     Coordination: Coordination abnormal.     Comments: Walker for ambulation, slow gait and in pain with  walking  Psychiatric:        Mood and Affect: Mood and affect normal.     Vitals:   07/29/20 1329  BP: 114/78  Pulse: 88  Temp: 98.5 F (36.9 C)  Weight: 200 lb (90.7 kg)  Height: 6' (1.829 m)    This visit occurred during the SARS-CoV-2 public health emergency.  Safety protocols were in place, including screening questions prior to the visit, additional usage of staff PPE, and extensive cleaning of exam room while observing appropriate contact time as indicated for disinfecting solutions.   Assessment & Plan:  Toradol 30 mg IM given at visit

## 2020-07-29 NOTE — Assessment & Plan Note (Signed)
Given toradol 30 mg IM at visit and rx for lyrica 75 mg BID.

## 2020-07-29 NOTE — Telephone Encounter (Signed)
Pt coming in for visit at 1:20 today.

## 2020-08-02 NOTE — Telephone Encounter (Signed)
He was advised to stop torsemide based on labs. We can do compression stockings for swelling if he would like let me know I can print rx.

## 2020-08-02 NOTE — Telephone Encounter (Signed)
Pt informed of below.  He states he already has compression stockings but can't get them on. He wants to know if there is an alternative?

## 2020-08-02 NOTE — Telephone Encounter (Signed)
Patient calling to report torsemide (DEMADEX) 20 MG tablet is not helping with his swelling. He was wondering if something else could be called in. It can be sent to [id number] - [place], [place].

## 2020-08-03 ENCOUNTER — Telehealth: Payer: Self-pay | Admitting: Internal Medicine

## 2020-08-03 ENCOUNTER — Other Ambulatory Visit: Payer: Self-pay | Admitting: Internal Medicine

## 2020-08-03 NOTE — Telephone Encounter (Signed)
Pt states his back pain has gotten worse & he is unable to stand up straight.  Pt states he started taking lyrica as directed & it is only helping a little bit.  He is requesting Dr Sharlet Salina reach out to his surgeon Dr Arneta Cliche to see if that would be helpful to get him in to see him quicker.

## 2020-08-03 NOTE — Telephone Encounter (Signed)
Pt notified that there Dr Sharlet Salina states there are no alternatives.  Pt encouraged to ask pharmacist for assistance on getting the stockings on.  Pt verb understanding.

## 2020-08-03 NOTE — Telephone Encounter (Signed)
I would not recommend alternative medication at this time.

## 2020-08-03 NOTE — Telephone Encounter (Signed)
Refill request for prednisone 10 mg tablets.  LOV- 07/29/20 Next OV- not scheduled Last refilled- 07/12/20

## 2020-08-03 NOTE — Telephone Encounter (Signed)
Patient reaching out stating his back pain has gotten unbearable and he has also reached out to his back surgeon about this but he hasn't got back to him so he wanted to reach out to [place] as well. Patient is not sure what he needs to do. [phone]

## 2020-08-04 ENCOUNTER — Telehealth: Payer: Self-pay | Admitting: Internal Medicine

## 2020-08-04 NOTE — Telephone Encounter (Signed)
Please see separate note from today regarding same for more instructions.

## 2020-08-04 NOTE — Telephone Encounter (Signed)
I would recommend elevating legs and using tight socks if cannot use compression stockings. Can have visit with Saturday clinic or next week in office.

## 2020-08-04 NOTE — Telephone Encounter (Signed)
Pt informed of below.  

## 2020-08-04 NOTE — Telephone Encounter (Signed)
Patient called and said that his right leg started weeping and he has his leg in a bucket. He said that it will be hard for him to walk if he tried to stand up. Transferred to [facility].

## 2020-08-04 NOTE — Telephone Encounter (Signed)
Spoke with patient and info given 

## 2020-08-04 NOTE — Telephone Encounter (Signed)
[facility] called back  Patient has a lot of edema and is unable to walk. [facility] advised patient to go to ED now.   Patient declined. Patient would rather come in office instead.   Please advise.

## 2020-08-04 NOTE — Telephone Encounter (Signed)
Patient called and was wondering if there was anything else that he could do for the edema on his right leg. Please call him back at [phone].

## 2020-08-04 NOTE — Telephone Encounter (Signed)
We have to wait at least a week before we increase dose of lyrica but can increase the dose to 2 pills twice a day once he is at a week. I do not have any pull with getting him in sooner with neurosurgeon unfortunately.

## 2020-08-08 ENCOUNTER — Observation Stay (HOSPITAL_COMMUNITY): Payer: Medicare Other

## 2020-08-08 ENCOUNTER — Telehealth: Payer: Self-pay | Admitting: Internal Medicine

## 2020-08-08 ENCOUNTER — Encounter (HOSPITAL_COMMUNITY): Payer: Self-pay

## 2020-08-08 ENCOUNTER — Inpatient Hospital Stay (HOSPITAL_COMMUNITY)
Admission: EM | Admit: 2020-08-08 | Discharge: 2020-08-11 | DRG: 872 | Disposition: A | Discharge status: Home Health | Payer: Medicare Other | Attending: Internal Medicine | Admitting: Internal Medicine

## 2020-08-08 ENCOUNTER — Other Ambulatory Visit: Payer: Self-pay

## 2020-08-08 ENCOUNTER — Emergency Department (HOSPITAL_BASED_OUTPATIENT_CLINIC_OR_DEPARTMENT_OTHER): Payer: Medicare Other

## 2020-08-08 ENCOUNTER — Emergency Department (HOSPITAL_COMMUNITY): Payer: Medicare Other

## 2020-08-08 DIAGNOSIS — Z9049 Acquired absence of other specified parts of digestive tract: Secondary | ICD-10-CM

## 2020-08-08 DIAGNOSIS — N179 Acute kidney failure, unspecified: Secondary | ICD-10-CM | POA: Diagnosis not present

## 2020-08-08 DIAGNOSIS — Z743 Need for continuous supervision: Secondary | ICD-10-CM | POA: Diagnosis not present

## 2020-08-08 DIAGNOSIS — Z66 Do not resuscitate: Secondary | ICD-10-CM | POA: Diagnosis not present

## 2020-08-08 DIAGNOSIS — I2510 Atherosclerotic heart disease of native coronary artery without angina pectoris: Secondary | ICD-10-CM | POA: Diagnosis present

## 2020-08-08 DIAGNOSIS — Z888 Allergy status to other drugs, medicaments and biological substances status: Secondary | ICD-10-CM

## 2020-08-08 DIAGNOSIS — Z85820 Personal history of malignant melanoma of skin: Secondary | ICD-10-CM

## 2020-08-08 DIAGNOSIS — G8929 Other chronic pain: Secondary | ICD-10-CM | POA: Diagnosis present

## 2020-08-08 DIAGNOSIS — I5032 Chronic diastolic (congestive) heart failure: Secondary | ICD-10-CM | POA: Diagnosis present

## 2020-08-08 DIAGNOSIS — L03115 Cellulitis of right lower limb: Secondary | ICD-10-CM | POA: Diagnosis not present

## 2020-08-08 DIAGNOSIS — Z87891 Personal history of nicotine dependence: Secondary | ICD-10-CM

## 2020-08-08 DIAGNOSIS — Z79899 Other long term (current) drug therapy: Secondary | ICD-10-CM

## 2020-08-08 DIAGNOSIS — L0390 Cellulitis, unspecified: Secondary | ICD-10-CM | POA: Diagnosis not present

## 2020-08-08 DIAGNOSIS — M159 Polyosteoarthritis, unspecified: Secondary | ICD-10-CM | POA: Diagnosis present

## 2020-08-08 DIAGNOSIS — N401 Enlarged prostate with lower urinary tract symptoms: Secondary | ICD-10-CM | POA: Diagnosis present

## 2020-08-08 DIAGNOSIS — I82812 Embolism and thrombosis of superficial veins of left lower extremities: Secondary | ICD-10-CM | POA: Diagnosis present

## 2020-08-08 DIAGNOSIS — J309 Allergic rhinitis, unspecified: Secondary | ICD-10-CM | POA: Diagnosis present

## 2020-08-08 DIAGNOSIS — A419 Sepsis, unspecified organism: Principal | ICD-10-CM | POA: Diagnosis present

## 2020-08-08 DIAGNOSIS — N1831 Chronic kidney disease, stage 3a: Secondary | ICD-10-CM | POA: Diagnosis not present

## 2020-08-08 DIAGNOSIS — Z96653 Presence of artificial knee joint, bilateral: Secondary | ICD-10-CM | POA: Diagnosis present

## 2020-08-08 DIAGNOSIS — R609 Edema, unspecified: Secondary | ICD-10-CM | POA: Diagnosis not present

## 2020-08-08 DIAGNOSIS — Z8249 Family history of ischemic heart disease and other diseases of the circulatory system: Secondary | ICD-10-CM

## 2020-08-08 DIAGNOSIS — N400 Enlarged prostate without lower urinary tract symptoms: Secondary | ICD-10-CM | POA: Diagnosis present

## 2020-08-08 DIAGNOSIS — Z20822 Contact with and (suspected) exposure to covid-19: Secondary | ICD-10-CM | POA: Diagnosis not present

## 2020-08-08 DIAGNOSIS — N138 Other obstructive and reflux uropathy: Secondary | ICD-10-CM | POA: Diagnosis present

## 2020-08-08 DIAGNOSIS — Z7952 Long term (current) use of systemic steroids: Secondary | ICD-10-CM

## 2020-08-08 DIAGNOSIS — M109 Gout, unspecified: Secondary | ICD-10-CM | POA: Diagnosis present

## 2020-08-08 DIAGNOSIS — R918 Other nonspecific abnormal finding of lung field: Secondary | ICD-10-CM | POA: Diagnosis not present

## 2020-08-08 DIAGNOSIS — Z981 Arthrodesis status: Secondary | ICD-10-CM

## 2020-08-08 DIAGNOSIS — Z85828 Personal history of other malignant neoplasm of skin: Secondary | ICD-10-CM

## 2020-08-08 DIAGNOSIS — R5381 Other malaise: Secondary | ICD-10-CM | POA: Diagnosis not present

## 2020-08-08 DIAGNOSIS — I5023 Acute on chronic systolic (congestive) heart failure: Secondary | ICD-10-CM | POA: Diagnosis present

## 2020-08-08 DIAGNOSIS — M130 Polyarthritis, unspecified: Secondary | ICD-10-CM | POA: Diagnosis present

## 2020-08-08 DIAGNOSIS — E785 Hyperlipidemia, unspecified: Secondary | ICD-10-CM | POA: Diagnosis present

## 2020-08-08 DIAGNOSIS — Z86718 Personal history of other venous thrombosis and embolism: Secondary | ICD-10-CM

## 2020-08-08 DIAGNOSIS — I130 Hypertensive heart and chronic kidney disease with heart failure and stage 1 through stage 4 chronic kidney disease, or unspecified chronic kidney disease: Secondary | ICD-10-CM | POA: Diagnosis present

## 2020-08-08 DIAGNOSIS — R6889 Other general symptoms and signs: Secondary | ICD-10-CM | POA: Diagnosis not present

## 2020-08-08 DIAGNOSIS — I10 Essential (primary) hypertension: Secondary | ICD-10-CM | POA: Diagnosis present

## 2020-08-08 DIAGNOSIS — I5022 Chronic systolic (congestive) heart failure: Secondary | ICD-10-CM | POA: Diagnosis present

## 2020-08-08 LAB — COMPREHENSIVE METABOLIC PANEL
ALT: 20 U/L (ref 0–44)
AST: 19 U/L (ref 15–41)
Albumin: 3.2 g/dL — ABNORMAL LOW (ref 3.5–5.0)
Alkaline Phosphatase: 86 U/L (ref 38–126)
Anion gap: 11 (ref 5–15)
BUN: 59 mg/dL — ABNORMAL HIGH (ref 8–23)
CO2: 28 mmol/L (ref 22–32)
Calcium: 9.5 mg/dL (ref 8.9–10.3)
Chloride: 94 mmol/L — ABNORMAL LOW (ref 98–111)
Creatinine, Ser: 1.76 mg/dL — ABNORMAL HIGH (ref 0.61–1.24)
GFR, Estimated: 37 mL/min — ABNORMAL LOW (ref >60)
Glucose, Bld: 103 mg/dL — ABNORMAL HIGH (ref 70–99)
Potassium: 5.1 mmol/L (ref 3.5–5.1)
Sodium: 133 mmol/L — ABNORMAL LOW (ref 135–145)
Total Bilirubin: 0.5 mg/dL (ref 0.3–1.2)
Total Protein: 6.7 g/dL (ref 6.5–8.1)

## 2020-08-08 LAB — CBC
HCT: 39.8 % (ref 39.0–52.0)
Hemoglobin: 12.5 g/dL — ABNORMAL LOW (ref 13.0–17.0)
MCH: 28.9 pg (ref 26.0–34.0)
MCHC: 31.4 g/dL (ref 30.0–36.0)
MCV: 92.1 fL (ref 80.0–100.0)
Platelets: 344 10*3/uL (ref 150–400)
RBC: 4.32 MIL/uL (ref 4.22–5.81)
RDW: 14.4 % (ref 11.5–15.5)
WBC: 15.1 10*3/uL — ABNORMAL HIGH (ref 4.0–10.5)
nRBC: 0 % (ref 0.0–0.2)

## 2020-08-08 LAB — SEDIMENTATION RATE: Sed Rate: 54 mm/hr — ABNORMAL HIGH (ref 0–16)

## 2020-08-08 LAB — BRAIN NATRIURETIC PEPTIDE: B Natriuretic Peptide: 277.9 pg/mL — ABNORMAL HIGH (ref 0.0–100.0)

## 2020-08-08 LAB — C-REACTIVE PROTEIN: CRP: 14.3 mg/dL — ABNORMAL HIGH (ref <1.0)

## 2020-08-08 MED ORDER — AMLODIPINE BESYLATE 10 MG PO TABS
10.0000 mg | ORAL_TABLET | Freq: Every day | ORAL | Status: DC
  Administered 2020-08-09 – 2020-08-11 (×3): 10 mg via ORAL
  Filled 2020-08-08 (×2): qty 1
  Filled 2020-08-08: qty 2

## 2020-08-08 MED ORDER — TAMSULOSIN HCL 0.4 MG PO CAPS
0.4000 mg | ORAL_CAPSULE | Freq: Every day | ORAL | Status: DC
  Administered 2020-08-09 – 2020-08-11 (×3): 0.4 mg via ORAL
  Filled 2020-08-08 (×3): qty 1

## 2020-08-08 MED ORDER — ENOXAPARIN SODIUM 40 MG/0.4ML ~~LOC~~ SOLN
40.0000 mg | SUBCUTANEOUS | Status: DC
  Administered 2020-08-09 – 2020-08-10 (×3): 40 mg via SUBCUTANEOUS
  Filled 2020-08-08 (×3): qty 0.4

## 2020-08-08 MED ORDER — MAGNESIUM CITRATE PO SOLN: 0.5000 | Freq: Every day | ORAL | Status: DC | PRN

## 2020-08-08 MED ORDER — CEFAZOLIN SODIUM-DEXTROSE 1-4 GM/50ML-% IV SOLN
1.0000 g | Freq: Three times a day (TID) | INTRAVENOUS | Status: DC
  Administered 2020-08-09 – 2020-08-11 (×7): 1 g via INTRAVENOUS
  Filled 2020-08-08 (×10): qty 50

## 2020-08-08 MED ORDER — ACETAMINOPHEN 650 MG RE SUPP: 650.0000 mg | Freq: Four times a day (QID) | RECTAL | Status: DC | PRN

## 2020-08-08 MED ORDER — OXYCODONE HCL 5 MG PO TABS
5.0000 mg | ORAL_TABLET | ORAL | Status: DC | PRN
  Administered 2020-08-09 – 2020-08-10 (×7): 5 mg via ORAL
  Filled 2020-08-08 (×7): qty 1

## 2020-08-08 MED ORDER — SODIUM CHLORIDE 0.9% FLUSH
3.0000 mL | Freq: Two times a day (BID) | INTRAVENOUS | Status: DC
  Administered 2020-08-09 – 2020-08-10 (×4): 3 mL via INTRAVENOUS

## 2020-08-08 MED ORDER — OXYCODONE HCL 5 MG PO TABS: 10.0000 mg | ORAL_TABLET | ORAL | Status: DC | PRN

## 2020-08-08 MED ORDER — ACETAMINOPHEN 325 MG PO TABS
650.0000 mg | ORAL_TABLET | Freq: Four times a day (QID) | ORAL | Status: DC | PRN
  Administered 2020-08-09 – 2020-08-10 (×3): 650 mg via ORAL
  Filled 2020-08-08 (×3): qty 2

## 2020-08-08 MED ORDER — PREDNISONE 20 MG PO TABS: 50.0000 mg | ORAL_TABLET | Freq: Every day | ORAL | Status: DC

## 2020-08-08 MED ORDER — ONDANSETRON 4 MG PO TBDP
4.0000 mg | ORAL_TABLET | Freq: Three times a day (TID) | ORAL | Status: DC | PRN
  Filled 2020-08-08: qty 1

## 2020-08-08 MED ORDER — CARVEDILOL 25 MG PO TABS
25.0000 mg | ORAL_TABLET | Freq: Two times a day (BID) | ORAL | Status: DC
  Administered 2020-08-09 – 2020-08-11 (×5): 25 mg via ORAL
  Filled 2020-08-08: qty 1
  Filled 2020-08-08: qty 8
  Filled 2020-08-08 (×3): qty 1

## 2020-08-08 MED ORDER — FUROSEMIDE 10 MG/ML IJ SOLN: 40.0000 mg | Freq: Once | INTRAMUSCULAR | Status: DC

## 2020-08-08 NOTE — Progress Notes (Signed)
Bilateral lower extremity venous study completed.      Please see CV Proc for preliminary results.   Laderrick Wilk, RVT  

## 2020-08-08 NOTE — H&P (Addendum)
History and Physical   [name] ERX:[id number] DOB: 10-02-1931 DOA: 08/08/2020  PCP: Hoyt Koch, MD   Patient coming from: Home  Chief Complaint: Leg swelling and pain  HPI: [name] is a 86 y.o. male with medical history significant of CAD, CHF, prior DVT, hyperlipidemia, hypertension, gout, BPH, arthritis who presents with ongoing leg swelling and new skin changes and pain.  Patient states that he has had months of swelling in his right lower extremity.  He states that he has additionally had 3 to 5 days of increased redness in his right lower extremity and it has become painful.  He has never had similar symptoms before.  The extremity has been weeping and he tried applying a paper towel that he secured with some masking tape which she is concerned may have worsened it.  He was seen about 10 days ago by his PCP for his ongoing leg swelling and he has tried increasing doses of his diuretic to improve this, but this has not helped.  He reports he started increasing his diuretic prior to seeing his PCP 10 days ago.  This is notable as he had elevated creatinine already on labs 10 days ago. He denies fever, cough, chills, shortness of breath, chest pain, abdominal pain, nausea, vomiting, diarrhea, constipation.    ED Course: Vitals in the ED were stable, did have softer blood pressure reading of 950 systolic.  Lab work-up showed BMP with sodium 133, chloride 94, creatinine of 1.76 increased from baseline of 1.2 about a month ago but similar to labs taken 10 days ago.  LFTs showed albumin 3.2.  CBC showed hemoglobin of 12.5 from baseline of 13.9 and leukocytosis to 15.  BMP, ESR, CRP are pending.  Respiratory panel for flu and Covid is pending.  Patient received a dose of Ancef in ED.  Review of Systems: As per HPI otherwise all other systems reviewed and are negative.  Past Medical History:  Diagnosis Date  . ACTINIC KERATOSIS, HEAD 03/23/2008  . ALLERGIC RHINITIS 03/27/2007   . Arthritis   . BENIGN PROSTATIC HYPERTROPHY, WITH URINARY OBSTRUCTION 10/16/2007  . CAD 08/18/2007   "patient denies any issues with heart, does not see cardiologist"  . Carpal tunnel syndrome, bilateral 09/04/2016  . CARPAL TUNNEL SYNDROME, LEFT 04/13/2008  . Chronic back pain   . Chronic neck pain   . Complication of anesthesia    left lower jaw teeth have been injured in the past and he has lost those, now has 2 loose front teeth due to a mouth injury on 01/02/20  . DVT (deep venous thrombosis) ([place])   . ERECTILE DYSFUNCTION, ORGANIC 02/20/2010  . Gout, unspecified 02/20/2010  . Headache(784.0)    migraines hx of  . History of melanoma   . HYPERLIPIDEMIA, WITH HIGH HDL 10/18/2008  . HYPERTENSION 05/09/2007   sees Dr. Benay Pillow  . KNEE PAIN 10/10/2009  . LOW BACK PAIN 03/27/2007  . NEOPLASM, MALIGNANT, SKIN, TRUNK 05/09/2007   squamous cell on scalp, sideburn and ear (right)  . Patellar tendinitis 10/10/2009  . Sciatic nerve pain    on Oxycodone as needed  . SKIN CANCER, HX OF 03/27/2007  . UNS ADVRS EFF UNS RX MEDICINAL&BIOLOGICAL SBSTNC 08/18/2007  . Urinary urgency     Past Surgical History:  Procedure Laterality Date  . APPENDECTOMY  1969  . APPLICATION OF WOUND VAC Left 01/19/2020   Procedure: Application Of Wound Vac;  Surgeon: [name], MD;  Location: [place];  Service: Orthopedics;  Laterality: Left;  . BACK SURGERY    . BLEPHAROPLASTY    . CARPAL TUNNEL RELEASE    . CHOLECYSTECTOMY    . ESOPHAGOGASTRODUODENOSCOPY (EGD) WITH PROPOFOL N/A 04/26/2015   Procedure: ESOPHAGOGASTRODUODENOSCOPY (EGD) WITH PROPOFOL;  Surgeon: Inda Castle, MD;  Location: [facility];  Service: Endoscopy;  Laterality: N/A;  . EYE SURGERY     bilateral cataracts removed and vitrectomies  . JOINT REPLACEMENT Right    knee  . KNEE ARTHROSCOPY    . KYPHOPLASTY N/A 06/10/2020   Procedure: Thoracic Twelve, Lumbar One, Lumbar Two Kyphoplasty;  Surgeon: Ashok Pall, MD;  Location: [place];   Service: Neurosurgery;  Laterality: N/A;  3C/RM 21  . LAMINECTOMY    . LATERAL FUSION LUMBAR SPINE, TRANSVERSE    . LUMBAR LAMINECTOMY/DECOMPRESSION MICRODISCECTOMY Left 11/03/2012   Procedure: LUMBAR LAMINECTOMY/DECOMPRESSION MICRODISCECTOMY 1 LEVEL;  Surgeon: Hosie Spangle, MD;  Location: [place] [facility];  Service: Neurosurgery;  Laterality: Left;  LEFT L5S1 laminotomy foraminotomy and possible microdiskectomy  . MELANOMA EXCISION    . NASAL SINUS SURGERY    . ORIF PATELLA Left 01/19/2020   Procedure: LEFT OPEN REDUCTION INTERNAL (ORIF) FIXATION PATELLA WITH AUTO GRAFTING USING HAMSTRING REINFORCEMENT;  Surgeon: [name], MD;  Location: [place];  Service: Orthopedics;  Laterality: Left;  . POSTERIOR CERVICAL FUSION/FORAMINOTOMY Right 10/01/2012   Procedure: POSTERIOR CERVICAL FUSION/FORAMINOTOMY LEVEL 1;  Surgeon: Hosie Spangle, MD;  Location: [place] [facility];  Service: Neurosurgery;  Laterality: Right;  Right Cervical seven thoracic one Cervical laminectomy/foraminotomy with posterior cervical arthrodesis   . POSTERIOR LUMBAR FUSION N/A 05/23/2013   Procedure: exploration of lumbar wound, explantation of left interbody implant.;  Surgeon: Hosie Spangle, MD;  Location: [place];  Service: Neurosurgery;  Laterality: N/A;  . RECTAL SURGERY     Fissure  . TOTAL KNEE ARTHROPLASTY Left 05/07/2019   Procedure: LEFT TOTAL KNEE ARTHROPLASTY;  Surgeon: [name], MD;  Location: [place];  Service: Orthopedics;  Laterality: Left;  Marland Kitchen VITRECTOMY     right and left 2013    Social History  reports that he quit smoking about 38 years ago. He has a 33.00 pack-year smoking history. He has never used smokeless tobacco. He reports current alcohol use of about 3.0 standard drinks of alcohol per week. He reports that he does not use drugs.  Allergies  Allergen Reactions  . Lyrica [Pregabalin] Swelling    Taking sometime as needed now  . Gabapentin Other (See Comments)    Lower extremity edema     Family History  Problem Relation Age of Onset  . Dementia Mother   . Anemia Father   . Heart disease Father   . Stroke Sister        ICH  . Lung disease Sister   Reviewed on admission  Prior to Admission medications   Medication Sig Start Date End Date Taking? Authorizing Provider  amLODipine (NORVASC) 10 MG tablet TAKE 1 TABLET BY MOUTH EVERY DAY Patient taking differently: Take 10 mg by mouth daily. 07/12/20  Yes Hoyt Koch, MD  carvedilol (COREG) 25 MG tablet TAKE 1 TABLET BY MOUTH 2 TIMES DAILY WITH A MEAL Patient taking differently: Take 25 mg by mouth 2 (two) times daily with a meal. 07/12/20  Yes Hoyt Koch, MD  fluticasone (FLONASE) 50 MCG/ACT nasal spray Place 1 spray into both nostrils daily.   Yes [provider]  hydrALAZINE (APRESOLINE) 50 MG tablet TAKE 1 TABLET BY  MOUTH TWICE A DAY Patient taking differently: Take 50 mg by mouth 2 (two) times daily. 03/21/20  Yes Hoyt Koch, MD  magnesium citrate SOLN Take 0.5 Bottles by mouth daily as needed for moderate constipation.   Yes [provider]  ondansetron (ZOFRAN-ODT) 8 MG disintegrating tablet TAKE 1 TABLET (8 MG TOTAL) BY MOUTH EVERY 8 (EIGHT) HOURS AS NEEDED FOR NAUSEA OR VOMITING. 07/05/20  Yes Hoyt Koch, MD  Oxycodone HCl 10 MG TABS Take 1 tablet (10 mg total) by mouth every 4 (four) hours as needed (pain score 4-6). Patient taking differently: Take 10 mg by mouth 4 (four) times daily. 06/01/19  Yes Magnant, Charles L, PA-C  predniSONE (DELTASONE) 10 MG tablet TAKE 1-4 TABLETS (10-40 MG TOTAL) BY MOUTH DAILY WITH BREAKFAST. Patient taking differently: Take 50 mg by mouth daily with breakfast. 08/04/20  Yes Hoyt Koch, MD  pregabalin (LYRICA) 75 MG capsule Take 1 capsule (75 mg total) by mouth 2 (two) times daily. Patient taking differently: Take 75 mg by mouth 2 (two) times daily as needed (pain). 07/29/20  Yes Hoyt Koch, MD   spironolactone (ALDACTONE) 25 MG tablet TAKE 1 TABLET BY MOUTH EVERY DAY Patient taking differently: Take 25 mg by mouth daily. 04/05/20  Yes Hoyt Koch, MD  tamsulosin (FLOMAX) 0.4 MG CAPS capsule TAKE 1 CAPSULE BY MOUTH EVERY DAY Patient taking differently: Take 0.4 mg by mouth at bedtime. 07/11/20  Yes Hoyt Koch, MD  torsemide (DEMADEX) 20 MG tablet Take 1-2 tablets (20-40 mg total) by mouth daily. Patient taking differently: Take 40-100 mg by mouth daily as needed (fluid). 07/25/20  Yes Hoyt Koch, MD  Ketorolac Tromethamine 15.75 MG/SPRAY SOLN Place 1 spray into the nose 3 (three) times daily as needed. 07/25/20   Hoyt Koch, MD    Physical Exam: Vitals:   08/08/20 2115 08/08/20 2130 08/08/20 2145 08/08/20 2200  BP: (!) 124/57 109/60 (!) 117/54 (!) 101/51  Pulse: 93 95 90 85  Resp: $Remo'15 20 13 14  'UhcjQ$ Temp:      TempSrc:      SpO2: 95% 97% 91% 90%   Physical Exam Constitutional:      General: He is not in acute distress.    Appearance: Normal appearance.     Comments: Tired appearing when seen  HENT:     Head: Normocephalic and atraumatic.     Mouth/Throat:     Mouth: Mucous membranes are moist.     Pharynx: Oropharynx is clear.  Eyes:     Extraocular Movements: Extraocular movements intact.     Pupils: Pupils are equal, round, and reactive to light.  Cardiovascular:     Rate and Rhythm: Normal rate and regular rhythm.     Pulses: Normal pulses.     Heart sounds: Normal heart sounds.  Pulmonary:     Effort: Pulmonary effort is normal. No respiratory distress.     Breath sounds: Normal breath sounds.  Abdominal:     General: Bowel sounds are normal. There is no distension.     Palpations: Abdomen is soft.     Tenderness: There is no abdominal tenderness.  Musculoskeletal:        General: No swelling or deformity.     Right lower leg: Edema present.     Left lower leg: Edema present.  Skin:    General: Skin is warm and dry.      Comments: Right lower extremity with erythema, edema, warmth  Neurological:  General: No focal deficit present.     Mental Status: He is oriented to person, place, and time. Mental status is at baseline.    Labs on Admission: I have personally reviewed following labs and imaging studies  CBC: Recent Labs  Lab 08/08/20 1431  WBC 15.1*  HGB 12.5*  HCT 39.8  MCV 92.1  PLT 062    Basic Metabolic Panel: Recent Labs  Lab 08/08/20 1431  NA 133*  K 5.1  CL 94*  CO2 28  GLUCOSE 103*  BUN 59*  CREATININE 1.76*  CALCIUM 9.5    GFR: Estimated Creatinine Clearance: 33.1 mL/min (A) (by C-G formula based on SCr of 1.76 mg/dL (H)).  Liver Function Tests: Recent Labs  Lab 08/08/20 1431  AST 19  ALT 20  ALKPHOS 86  BILITOT 0.5  PROT 6.7  ALBUMIN 3.2*    Urine analysis:    Component Value Date/Time   COLORURINE YELLOW 04/30/2019 1033   APPEARANCEUR CLEAR 04/30/2019 1033   LABSPEC 1.017 04/30/2019 1033   PHURINE 7.0 04/30/2019 1033   GLUCOSEU NEGATIVE 04/30/2019 1033   GLUCOSEU NEGATIVE 10/03/2018 1036   HGBUR NEGATIVE 04/30/2019 1033   BILIRUBINUR NEGATIVE 04/30/2019 1033   BILIRUBINUR negative 10/17/2018 1555   KETONESUR NEGATIVE 04/30/2019 1033   PROTEINUR NEGATIVE 04/30/2019 1033   UROBILINOGEN 0.2 10/17/2018 1555   UROBILINOGEN 0.2 10/03/2018 1036   NITRITE NEGATIVE 04/30/2019 1033   LEUKOCYTESUR NEGATIVE 04/30/2019 1033    Radiological Exams on Admission: DG Chest Portable 1 View  Result Date: 08/08/2020 CLINICAL DATA:  Lower extremity edema.  Leg swelling for 1 month. EXAM: PORTABLE CHEST 1 VIEW COMPARISON:  Chest radiograph 03/29/2020 FINDINGS: Normal heart size with unchanged mediastinal contours. Aortic atherosclerosis. No pulmonary edema. Subsegmental atelectasis or scarring at the left lung base. No confluent consolidation. No pneumothorax or significant pleural effusion. Surgical hardware in the lower cervical spine partially included. High-riding  humeral heads. No acute osseous abnormalities are seen. IMPRESSION: 1. Subsegmental atelectasis or scarring at the left lung base. 2. No evidence of fluid overload or pulmonary edema. Aortic Atherosclerosis (ICD10-I70.0). Electronically Signed   By: Keith Rake M.D.   On: 08/08/2020 20:51   VAS [place] LOWER EXTREMITY VENOUS (DVT) (ONLY [facility] & [facility])  Result Date: 08/08/2020  Lower Venous DVT Study Indications: Edema.  Comparison Study: Previous exam 03/30/2020 Performing Technologist: Vonzell Schlatter RVT  Examination Guidelines: A complete evaluation includes B-mode imaging, spectral Doppler, color Doppler, and power Doppler as needed of all accessible portions of each vessel. Bilateral testing is considered an integral part of a complete examination. Limited examinations for reoccurring indications may be performed as noted. The reflux portion of the exam is performed with the patient in reverse Trendelenburg.  +---------+---------------+---------+-----------+----------+--------------+ RIGHT    CompressibilityPhasicitySpontaneityPropertiesThrombus Aging +---------+---------------+---------+-----------+----------+--------------+ CFV      Full           Yes      Yes                                 +---------+---------------+---------+-----------+----------+--------------+ SFJ      Full                                                        +---------+---------------+---------+-----------+----------+--------------+ FV Prox  Full                                                        +---------+---------------+---------+-----------+----------+--------------+  FV Mid   Full                                                        +---------+---------------+---------+-----------+----------+--------------+ FV DistalFull                                                        +---------+---------------+---------+-----------+----------+--------------+ PFV      Full                                                         +---------+---------------+---------+-----------+----------+--------------+ POP      Full           Yes      Yes                                 +---------+---------------+---------+-----------+----------+--------------+ PTV      Full                                                        +---------+---------------+---------+-----------+----------+--------------+ PERO     Full                                                        +---------+---------------+---------+-----------+----------+--------------+   +---------+---------------+---------+-----------+----------+--------------+ LEFT     CompressibilityPhasicitySpontaneityPropertiesThrombus Aging +---------+---------------+---------+-----------+----------+--------------+ CFV      Full           Yes      Yes                                 +---------+---------------+---------+-----------+----------+--------------+ SFJ      Full                                                        +---------+---------------+---------+-----------+----------+--------------+ FV Prox  Full                                                        +---------+---------------+---------+-----------+----------+--------------+ FV Mid   Full                                                        +---------+---------------+---------+-----------+----------+--------------+  FV DistalFull                                                        +---------+---------------+---------+-----------+----------+--------------+ PFV      Full                                                        +---------+---------------+---------+-----------+----------+--------------+ POP      Full           Yes      Yes                                 +---------+---------------+---------+-----------+----------+--------------+ PTV      Full                                                         +---------+---------------+---------+-----------+----------+--------------+ PERO     Full                                                        +---------+---------------+---------+-----------+----------+--------------+ SSV      Partial                                      Chronic        +---------+---------------+---------+-----------+----------+--------------+     Summary: RIGHT: - There is no evidence of deep vein thrombosis in the lower extremity.  - No cystic structure found in the popliteal fossa.  LEFT: - Known chronic superficial vein thrombosis involving the left small saphenous vein. - There is no evidence of deep vein thrombosis in the lower extremity.  - No cystic structure found in the popliteal fossa. - Known Chronic SSV found 03/2020  *See table(s) above for measurements and observations. Electronically signed by Ruta Hinds MD on 08/08/2020 at 5:21:53 PM.    Final     EKG: Independently reviewed.  Normal sinus rhythm at 87 bpm.  Significant baseline artifact in all leads.  Assessment/Plan Principal Problem:   Cellulitis Active Problems:   Hyperlipidemia   Essential hypertension   CAD (coronary artery disease)   Polyarticular arthritis  Cellulitis > Ongoing swelling in the right lower extremity with development of pain, redness and weeping over the last 3 to 5 days. > Also noted is leukocytosis to 15 > Does have history of DVT but DVT study in ED is negative bilaterally other than chronic superficial vein thrombosis in left lower extremity - Continue Ancef - Monitor demarcation - Monitor fever curve and white count - Follow-up ESR and CRP  Acute renal failure > Creatinine elevated to 1.76 from baseline of 1.2, of note 10 days ago his creatinine was 1.65 > Possibly due to taking increasing  doses of torsemide at home now up to 5 pills a day, he states he has not had much response from this possibly due to worsening renal function. - Follow-up BNP, if not  elevated will start IV fluids - Avoid nephrotoxic agents - Trend renal function and electrolytes - Check Renal U/S considering BPH  CAD Hypertension Hyperlipidemia CHF > Last echo in 2019 with EF 55-60%, G1DD > Does have bilateral lower extremity edema not responding to increasing dose of diuretics at home, unclear if it is not responding as it is not related to his CHF or due to worsening renal function and higher threshold. - Holding home spironolactone, torsemide, hydralazine given soft blood pressure in ED and AKI - Continue home metoprolol - Follow-up BNP, if significantly elevated will work on CHF exacerbation work-up ADENDUM: > BNP came back mildly elevated at 277 > As edema is chronic and blood pressures have been soft we will hold off on additional Lasix for now but will get echo and further evaluation. - We will hold off on further IV fluids - Remain on telemetry, I/O, daily weight - Echocardiogram  BPH - Continue home tamsulosin  Chronic pain  - Continue home OxyIR at reduced dose due to AKI - Holding as needed prednisone - Holding Lyrica  DVT prophylaxis: Lovenox  Code Status:   DNR Family Communication:  Updated wife Jana Half by phone.  She reports he has had some increase in the size of his abdomen recently as well, possibly edema.  Disposition Plan:   Patient is from:  Home  Anticipated DC to:  Home  Anticipated DC date:  1 to 2 days  Anticipated DC barriers: None  Consults called:  None  Admission status:  Observation, telemetry   Severity of Illness: The appropriate patient status for this patient is OBSERVATION. Observation status is judged to be reasonable and necessary in order to provide the required intensity of service to ensure the patient's safety. The patient's presenting symptoms, physical exam findings, and initial radiographic and laboratory data in the context of their medical condition is felt to place them at decreased risk for further clinical  deterioration. Furthermore, it is anticipated that the patient will be medically stable for discharge from the hospital within 2 midnights of admission. The following factors support the patient status of observation.   " The patient's presenting symptoms include right leg swelling, pain, redness. " The physical exam findings include right leg erythema, edema, warmth. " The initial radiographic and laboratory data are concerning for AKI and infection given creatinine elevated to 1.76 and leukocytosis to 15.Marcelyn Bruins MD [facility]  How to contact the [facility] Attending or Consulting provider [place] or covering provider during after hours [place], for this patient?   1. Check the care team in [facility] and look for a) attending/consulting [facility] provider listed and b) the [facility] team listed 2. Log into [email or web address] and use [place]'s universal password to access. If you do not have the password, please contact the hospital operator. 3. Locate the [facility] provider you are looking for under [facility] and page to a number that you can be directly reached. 4. If you still have difficulty reaching the provider, please page the [facility] (Director on Call) for the Hospitalists listed on amion for assistance.  08/08/2020, 10:23 PM

## 2020-08-08 NOTE — Telephone Encounter (Signed)
Patient calling regarding right leg inflammed, have had seepage with water  What can he do about it?  Please call the patient at [phone]

## 2020-08-08 NOTE — ED Provider Notes (Signed)
[facility] EMERGENCY DEPARTMENT Provider Note   CSN: [id number] Arrival date & time: 08/08/20  1419     History Chief Complaint  Patient presents with  . Leg Swelling    [name] is a 86 y.o. male.  HPI Pt is an 86 y/o male with a history of CAD, CHF, prior DVT, HLD, HTN, gout, arthritis, BPH who presents to ED with complaint of bilateral lower extremity swelling. Pt states he has had swelling for approximately 1 month with increasing redness to the R lower leg for the last 3-5 days. The swelling and skin changes are painful. He denies ever having anything similar prior. He also notes that within the last few days, he applied masking tape directly to the skin to hold a paper towel in place over the weeping areas of the legs - he is worried this may have exacerbated his skin problems. He has seen his PCP regarding the swelling and has been placed on increasing doses of diuretics (states he has taken "one, then two, then three, then four, then five" doses of daily Lasix) without any improvement in his swelling. Pt denies any chest pain, dyspnea, or fevers.    Past Medical History:  Diagnosis Date  . ACTINIC KERATOSIS, HEAD 03/23/2008  . ALLERGIC RHINITIS 03/27/2007  . Arthritis   . BENIGN PROSTATIC HYPERTROPHY, WITH URINARY OBSTRUCTION 10/16/2007  . CAD 08/18/2007   "patient denies any issues with heart, does not see cardiologist"  . Carpal tunnel syndrome, bilateral 09/04/2016  . CARPAL TUNNEL SYNDROME, LEFT 04/13/2008  . Chronic back pain   . Chronic neck pain   . Complication of anesthesia    left lower jaw teeth have been injured in the past and he has lost those, now has 2 loose front teeth due to a mouth injury on 01/02/20  . DVT (deep venous thrombosis) ([place])   . ERECTILE DYSFUNCTION, ORGANIC 02/20/2010  . Gout, unspecified 02/20/2010  . Headache(784.0)    migraines hx of  . History of melanoma   . HYPERLIPIDEMIA, WITH HIGH HDL 10/18/2008  . HYPERTENSION  05/09/2007   sees Dr. Benay Pillow  . KNEE PAIN 10/10/2009  . LOW BACK PAIN 03/27/2007  . NEOPLASM, MALIGNANT, SKIN, TRUNK 05/09/2007   squamous cell on scalp, sideburn and ear (right)  . Patellar tendinitis 10/10/2009  . Sciatic nerve pain    on Oxycodone as needed  . SKIN CANCER, HX OF 03/27/2007  . UNS ADVRS EFF UNS RX MEDICINAL&BIOLOGICAL SBSTNC 08/18/2007  . Urinary urgency     Patient Active Problem List   Diagnosis Date Noted  . Greater trochanteric bursitis of both hips 05/04/2020  . Elevated d-dimer 05/04/2020  . Left patella fracture 01/19/2020  . Rib pain on left side 09/18/2019  . Arthritis of left knee 05/07/2019  . Pre-operative clearance 04/28/2019  . Acute diastolic CHF (congestive heart failure) ([place]) 08/10/2018  . Leg wound, left 03/19/2018  . Right arm pain 11/11/2017  . Primary osteoarthritis of right foot 08/21/2017  . Syncope 02/27/2017  . Arthritis of right ankle 12/18/2016  . Carpal tunnel syndrome, bilateral 09/04/2016  . Leg swelling 08/16/2016  . Hand weakness 07/12/2016  . Degenerative arthritis of left knee 06/28/2016  . Iron deficiency anemia 03/16/2016  . Arthritis of left acromioclavicular joint 01/03/2016  . Rotator cuff disorder 01/03/2016  . Osteoarthritis of left knee 04/30/2015  . Baker's cyst of knee 04/30/2015  . Esophageal stricture 04/26/2015  . Benign prostatic hyperplasia 02/21/2015  . Constipation  02/21/2015  . Myalgia and myositis 11/18/2014  . Polyarticular arthritis 07/19/2013  . Chronic radicular low back pain 12/15/2012  . History of gout 02/20/2010  . ERECTILE DYSFUNCTION, ORGANIC 02/20/2010  . Hyperlipidemia 10/18/2008  . CAD 08/18/2007  . Essential hypertension 05/09/2007  . ALLERGIC RHINITIS 03/27/2007    Past Surgical History:  Procedure Laterality Date  . APPENDECTOMY  1969  . APPLICATION OF WOUND VAC Left 01/19/2020   Procedure: Application Of Wound Vac;  Surgeon: [name], MD;  Location: [facility];  Service:  Orthopedics;  Laterality: Left;  . BACK SURGERY    . BLEPHAROPLASTY    . CARPAL TUNNEL RELEASE    . CHOLECYSTECTOMY    . ESOPHAGOGASTRODUODENOSCOPY (EGD) WITH PROPOFOL N/A 04/26/2015   Procedure: ESOPHAGOGASTRODUODENOSCOPY (EGD) WITH PROPOFOL;  Surgeon: Inda Castle, MD;  Location: [facility];  Service: Endoscopy;  Laterality: N/A;  . EYE SURGERY     bilateral cataracts removed and vitrectomies  . JOINT REPLACEMENT Right    knee  . KNEE ARTHROSCOPY    . KYPHOPLASTY N/A 06/10/2020   Procedure: Thoracic Twelve, Lumbar One, Lumbar Two Kyphoplasty;  Surgeon: Ashok Pall, MD;  Location: [facility];  Service: Neurosurgery;  Laterality: N/A;  3C/RM 21  . LAMINECTOMY    . LATERAL FUSION LUMBAR SPINE, TRANSVERSE    . LUMBAR LAMINECTOMY/DECOMPRESSION MICRODISCECTOMY Left 11/03/2012   Procedure: LUMBAR LAMINECTOMY/DECOMPRESSION MICRODISCECTOMY 1 LEVEL;  Surgeon: Hosie Spangle, MD;  Location: [facility];  Service: Neurosurgery;  Laterality: Left;  LEFT L5S1 laminotomy foraminotomy and possible microdiskectomy  . MELANOMA EXCISION    . NASAL SINUS SURGERY    . ORIF PATELLA Left 01/19/2020   Procedure: LEFT OPEN REDUCTION INTERNAL (ORIF) FIXATION PATELLA WITH AUTO GRAFTING USING HAMSTRING REINFORCEMENT;  Surgeon: [name], MD;  Location: [facility];  Service: Orthopedics;  Laterality: Left;  . POSTERIOR CERVICAL FUSION/FORAMINOTOMY Right 10/01/2012   Procedure: POSTERIOR CERVICAL FUSION/FORAMINOTOMY LEVEL 1;  Surgeon: Hosie Spangle, MD;  Location: [facility];  Service: Neurosurgery;  Laterality: Right;  Right Cervical seven thoracic one Cervical laminectomy/foraminotomy with posterior cervical arthrodesis   . POSTERIOR LUMBAR FUSION N/A 05/23/2013   Procedure: exploration of lumbar wound, explantation of left interbody implant.;  Surgeon: Hosie Spangle, MD;  Location: [facility];  Service: Neurosurgery;  Laterality: N/A;  . RECTAL SURGERY     Fissure  . TOTAL KNEE ARTHROPLASTY Left  05/07/2019   Procedure: LEFT TOTAL KNEE ARTHROPLASTY;  Surgeon: [name], MD;  Location: [facility];  Service: Orthopedics;  Laterality: Left;  Marland Kitchen VITRECTOMY     right and left 2013       Family History  Problem Relation Age of Onset  . Dementia Mother   . Anemia Father   . Heart disease Father   . Stroke Sister        ICH  . Lung disease Sister     Social History   Tobacco Use  . Smoking status: Former Smoker    Packs/day: 1.00    Years: 33.00    Pack years: 33.00    Quit date: 09/29/1981    Years since quitting: 38.8  . Smokeless tobacco: Never Used  Vaping Use  . Vaping Use: Never used  Substance Use Topics  . Alcohol use: Yes    Alcohol/week: 3.0 standard drinks    Types: 3 Standard drinks or equivalent per week    Comment: Social  . Drug use: No    Home Medications Prior to Admission  medications   Medication Sig Start Date End Date Taking? Authorizing Provider  amLODipine (NORVASC) 10 MG tablet TAKE 1 TABLET BY MOUTH EVERY DAY Patient taking differently: Take 10 mg by mouth daily. 07/12/20   Hoyt Koch, MD  carvedilol (COREG) 25 MG tablet TAKE 1 TABLET BY MOUTH 2 TIMES DAILY WITH A MEAL 07/12/20   Hoyt Koch, MD  fluticasone [facility]) 50 MCG/ACT nasal spray Place 1 spray into both nostrils daily as needed for allergies.     [provider]  hydrALAZINE (APRESOLINE) 50 MG tablet TAKE 1 TABLET BY MOUTH TWICE A DAY Patient taking differently: Take 50 mg by mouth 2 (two) times daily. 03/21/20   Hoyt Koch, MD  Ketorolac Tromethamine 15.75 MG/SPRAY SOLN Place 1 spray into the nose 3 (three) times daily as needed. Patient not taking: Reported on 07/29/2020 07/25/20   Hoyt Koch, MD  magnesium citrate SOLN Take 0.5 Bottles by mouth daily as needed for severe constipation.    [provider]  ondansetron (ZOFRAN-ODT) 8 MG disintegrating tablet TAKE 1 TABLET (8 MG TOTAL) BY MOUTH EVERY 8 (EIGHT) HOURS AS  NEEDED FOR NAUSEA OR VOMITING. 07/05/20   Hoyt Koch, MD  Oxycodone HCl 10 MG TABS Take 1 tablet (10 mg total) by mouth every 4 (four) hours as needed (pain score 4-6). Patient taking differently: Take 10 mg by mouth 5 (five) times daily as needed (pain score 4-6). 06/01/19   Magnant, Charles L, PA-C  predniSONE (DELTASONE) 10 MG tablet TAKE 1-4 TABLETS (10-40 MG TOTAL) BY MOUTH DAILY WITH BREAKFAST. 08/04/20   Hoyt Koch, MD  pregabalin (LYRICA) 75 MG capsule Take 1 capsule (75 mg total) by mouth 2 (two) times daily. 07/29/20   Hoyt Koch, MD  spironolactone (ALDACTONE) 25 MG tablet TAKE 1 TABLET BY MOUTH EVERY DAY Patient taking differently: Take 25 mg by mouth daily. 04/05/20   Hoyt Koch, MD  tamsulosin (FLOMAX) 0.4 MG CAPS capsule TAKE 1 CAPSULE BY MOUTH EVERY DAY 07/11/20   Hoyt Koch, MD  torsemide (DEMADEX) 20 MG tablet Take 1-2 tablets (20-40 mg total) by mouth daily. Patient not taking: Reported on 07/29/2020 07/25/20   Hoyt Koch, MD  triamcinolone cream (KENALOG) 0.5 % APPLY 1 APPLICATION TOPICALLY 2 (TWO) TIMES DAILY AS NEEDED (DRY SKIN, ITCHING). 08/19/19   Hoyt Koch, MD    Allergies    Gabapentin and Lyrica [pregabalin]  Review of Systems   Review of Systems  Constitutional: Negative for chills and fever.  HENT: Negative for ear pain, rhinorrhea and sore throat.   Eyes: Negative for pain and visual disturbance.  Respiratory: Negative for cough, shortness of breath and wheezing.   Cardiovascular: Positive for leg swelling. Negative for chest pain.  Gastrointestinal: Negative for abdominal distention, abdominal pain, diarrhea, nausea and vomiting.  Genitourinary: Negative for dysuria and hematuria.  Musculoskeletal: Positive for gait problem and joint swelling.       Knee pain and difficulty walking - uses walker at baseline and has still been able to continue with this despite swelling  Skin: Positive  for color change. Negative for rash.  Neurological: Negative for syncope, light-headedness and headaches.  Psychiatric/Behavioral: Negative for confusion and suicidal ideas.  All other systems reviewed and are negative.   Physical Exam Updated Vital Signs BP 121/73   Pulse 89   Temp 97.8 F (36.6 C) (Oral)   Resp 17   SpO2 96%   Physical Exam Vitals and nursing  note reviewed.  Constitutional:      General: He is not in acute distress.    Appearance: Normal appearance. He is well-developed and well-nourished. He is not ill-appearing or toxic-appearing.  HENT:     Head: Normocephalic and atraumatic.     Nose: Nose normal.     Mouth/Throat:     Mouth: Mucous membranes are moist.     Pharynx: Oropharynx is clear.  Eyes:     General: No scleral icterus.    Pupils: Pupils are equal, round, and reactive to light.  Cardiovascular:     Rate and Rhythm: Normal rate and regular rhythm.     Pulses: Normal pulses.     Heart sounds: No friction rub. No gallop.      Comments: Distant heart sounds Pulmonary:     Effort: Pulmonary effort is normal. No respiratory distress.     Breath sounds: Normal breath sounds. No wheezing, rhonchi or rales.  Abdominal:     General: There is distension.     Palpations: Abdomen is soft.     Tenderness: There is no abdominal tenderness.  Musculoskeletal:     Cervical back: Neck supple.     Right lower leg: Edema present.     Left lower leg: Edema present.     Comments: Bilateral 3+ pitting edema to lower legs. RLE has additional significant diffuse erythema. There is weeping to both sides. LLE with ulcerated wound to lateral lower leg.  Skin:    General: Skin is warm and dry.     Findings: Erythema present.  Neurological:     Mental Status: He is alert.     Comments: Alert, grossly oriented, moves all extremities spontaneously  Psychiatric:        Mood and Affect: Mood and affect normal.        Behavior: Behavior normal.     ED Results /  Procedures / Treatments   Labs (all labs ordered are listed, but only abnormal results are displayed) Labs Reviewed  CBC - Abnormal; Notable for the following components:      Result Value   WBC 15.1 (*)    Hemoglobin 12.5 (*)    All other components within normal limits  COMPREHENSIVE METABOLIC PANEL - Abnormal; Notable for the following components:   Sodium 133 (*)    Chloride 94 (*)    Glucose, Bld 103 (*)    BUN 59 (*)    Creatinine, Ser 1.76 (*)    Albumin 3.2 (*)    GFR, Estimated 37 (*)    All other components within normal limits  BRAIN NATRIURETIC PEPTIDE - Abnormal; Notable for the following components:   B Natriuretic Peptide 277.9 (*)    All other components within normal limits  SEDIMENTATION RATE - Abnormal; Notable for the following components:   Sed Rate 54 (*)    All other components within normal limits  C-REACTIVE PROTEIN - Abnormal; Notable for the following components:   CRP 14.3 (*)    All other components within normal limits  BASIC METABOLIC PANEL  CBC    EKG EKG Interpretation  Date/Time:  Monday August 08 2020 14:40:37 EST Ventricular Rate:  87 PR Interval:  186 QRS Duration: 86 QT Interval:  340 QTC Calculation: 409 R Axis:   63 Text Interpretation: Normal sinus rhythm Normal ECG Confirmed by Kennis Carina [phone]) on 08/08/2020 9:42:29 PM   Radiology DG Chest Portable 1 View  Result Date: 08/08/2020 CLINICAL DATA:  Lower extremity edema.  Leg  swelling for 1 month. EXAM: PORTABLE CHEST 1 VIEW COMPARISON:  Chest radiograph 03/29/2020 FINDINGS: Normal heart size with unchanged mediastinal contours. Aortic atherosclerosis. No pulmonary edema. Subsegmental atelectasis or scarring at the left lung base. No confluent consolidation. No pneumothorax or significant pleural effusion. Surgical hardware in the lower cervical spine partially included. High-riding humeral heads. No acute osseous abnormalities are seen. IMPRESSION: 1. Subsegmental  atelectasis or scarring at the left lung base. 2. No evidence of fluid overload or pulmonary edema. Aortic Atherosclerosis (ICD10-I70.0). Electronically Signed   By: Keith Rake M.D.   On: 08/08/2020 20:51   VAS [place] LOWER EXTREMITY VENOUS (DVT) (ONLY [facility] & [facility])  Result Date: 08/08/2020  Lower Venous DVT Study Indications: Edema.  Comparison Study: Previous exam 03/30/2020 Performing Technologist: Vonzell Schlatter RVT  Examination Guidelines: A complete evaluation includes B-mode imaging, spectral Doppler, color Doppler, and power Doppler as needed of all accessible portions of each vessel. Bilateral testing is considered an integral part of a complete examination. Limited examinations for reoccurring indications may be performed as noted. The reflux portion of the exam is performed with the patient in reverse Trendelenburg.  +---------+---------------+---------+-----------+----------+--------------+ RIGHT    CompressibilityPhasicitySpontaneityPropertiesThrombus Aging +---------+---------------+---------+-----------+----------+--------------+ CFV      Full           Yes      Yes                                 +---------+---------------+---------+-----------+----------+--------------+ SFJ      Full                                                        +---------+---------------+---------+-----------+----------+--------------+ FV Prox  Full                                                        +---------+---------------+---------+-----------+----------+--------------+ FV Mid   Full                                                        +---------+---------------+---------+-----------+----------+--------------+ FV DistalFull                                                        +---------+---------------+---------+-----------+----------+--------------+ PFV      Full                                                         +---------+---------------+---------+-----------+----------+--------------+ POP      Full           Yes      Yes                                 +---------+---------------+---------+-----------+----------+--------------+  PTV      Full                                                        +---------+---------------+---------+-----------+----------+--------------+ PERO     Full                                                        +---------+---------------+---------+-----------+----------+--------------+   +---------+---------------+---------+-----------+----------+--------------+ LEFT     CompressibilityPhasicitySpontaneityPropertiesThrombus Aging +---------+---------------+---------+-----------+----------+--------------+ CFV      Full           Yes      Yes                                 +---------+---------------+---------+-----------+----------+--------------+ SFJ      Full                                                        +---------+---------------+---------+-----------+----------+--------------+ FV Prox  Full                                                        +---------+---------------+---------+-----------+----------+--------------+ FV Mid   Full                                                        +---------+---------------+---------+-----------+----------+--------------+ FV DistalFull                                                        +---------+---------------+---------+-----------+----------+--------------+ PFV      Full                                                        +---------+---------------+---------+-----------+----------+--------------+ POP      Full           Yes      Yes                                 +---------+---------------+---------+-----------+----------+--------------+ PTV      Full                                                         +---------+---------------+---------+-----------+----------+--------------+  PERO     Full                                                        +---------+---------------+---------+-----------+----------+--------------+ SSV      Partial                                      Chronic        +---------+---------------+---------+-----------+----------+--------------+     Summary: RIGHT: - There is no evidence of deep vein thrombosis in the lower extremity.  - No cystic structure found in the popliteal fossa.  LEFT: - Known chronic superficial vein thrombosis involving the left small saphenous vein. - There is no evidence of deep vein thrombosis in the lower extremity.  - No cystic structure found in the popliteal fossa. - Known Chronic SSV found 03/2020  *See table(s) above for measurements and observations. Electronically signed by Ruta Hinds MD on 08/08/2020 at 5:21:53 PM.    Final     Procedures Procedures (including critical care time)  Medications Ordered in ED Medications - No data to display  ED Course  I have reviewed the triage vital signs and the nursing notes.  Pertinent labs & imaging results that were available during my care of the patient were reviewed by me and considered in my medical decision making (see chart for details).    MDM Rules/Calculators/A&P                          86 y/o male with lower extremity edema and erythema. Pt overall very well appearing on initial exam. I am most concerned for cellulitis in the context of possible heart failure exacerbation; differential includes DVT, peripheral vascular disease. Pt is without pulmonary edema or respiratory distress at this time. Antibiotic coverage initiated with ancef.  Labs reviewed and notable for leukocytosis and AKI. U/S of the lower extremities does not show DVT.  Pt will require admission for continued IV antibiotic coverage and careful titration of his volume status. He will be admitted to the  Hospitalist service. Remained in stable condition at time of admission.  Final Clinical Impression(s) / ED Diagnoses Final diagnoses:  AKI (acute kidney injury) ([place])  Cellulitis of right lower extremity     Vanna Scotland, MD [id number]    Maudie Flakes, MD 08/10/20 [phone]

## 2020-08-08 NOTE — ED Triage Notes (Signed)
Pt here today due to bilateral leg swelling. Pt reports x1 month he has been having leg swelling. Pt also has open wound to left leg x4 days.

## 2020-08-09 ENCOUNTER — Observation Stay (HOSPITAL_BASED_OUTPATIENT_CLINIC_OR_DEPARTMENT_OTHER): Payer: Medicare Other

## 2020-08-09 DIAGNOSIS — N400 Enlarged prostate without lower urinary tract symptoms: Secondary | ICD-10-CM | POA: Diagnosis present

## 2020-08-09 DIAGNOSIS — L03115 Cellulitis of right lower limb: Secondary | ICD-10-CM

## 2020-08-09 DIAGNOSIS — I130 Hypertensive heart and chronic kidney disease with heart failure and stage 1 through stage 4 chronic kidney disease, or unspecified chronic kidney disease: Secondary | ICD-10-CM | POA: Diagnosis present

## 2020-08-09 DIAGNOSIS — N1831 Chronic kidney disease, stage 3a: Secondary | ICD-10-CM | POA: Diagnosis present

## 2020-08-09 DIAGNOSIS — I5032 Chronic diastolic (congestive) heart failure: Secondary | ICD-10-CM | POA: Diagnosis present

## 2020-08-09 DIAGNOSIS — A419 Sepsis, unspecified organism: Secondary | ICD-10-CM | POA: Diagnosis present

## 2020-08-09 DIAGNOSIS — N179 Acute kidney failure, unspecified: Secondary | ICD-10-CM | POA: Diagnosis present

## 2020-08-09 DIAGNOSIS — Z66 Do not resuscitate: Secondary | ICD-10-CM | POA: Diagnosis present

## 2020-08-09 DIAGNOSIS — N401 Enlarged prostate with lower urinary tract symptoms: Secondary | ICD-10-CM | POA: Diagnosis present

## 2020-08-09 DIAGNOSIS — J309 Allergic rhinitis, unspecified: Secondary | ICD-10-CM | POA: Diagnosis present

## 2020-08-09 DIAGNOSIS — N138 Other obstructive and reflux uropathy: Secondary | ICD-10-CM | POA: Diagnosis present

## 2020-08-09 DIAGNOSIS — I2510 Atherosclerotic heart disease of native coronary artery without angina pectoris: Secondary | ICD-10-CM | POA: Diagnosis present

## 2020-08-09 DIAGNOSIS — I82812 Embolism and thrombosis of superficial veins of left lower extremities: Secondary | ICD-10-CM | POA: Diagnosis present

## 2020-08-09 DIAGNOSIS — I5031 Acute diastolic (congestive) heart failure: Secondary | ICD-10-CM | POA: Diagnosis not present

## 2020-08-09 DIAGNOSIS — Z85828 Personal history of other malignant neoplasm of skin: Secondary | ICD-10-CM | POA: Diagnosis not present

## 2020-08-09 DIAGNOSIS — Z85820 Personal history of malignant melanoma of skin: Secondary | ICD-10-CM | POA: Diagnosis not present

## 2020-08-09 DIAGNOSIS — G8929 Other chronic pain: Secondary | ICD-10-CM | POA: Diagnosis present

## 2020-08-09 DIAGNOSIS — L0390 Cellulitis, unspecified: Secondary | ICD-10-CM | POA: Diagnosis not present

## 2020-08-09 DIAGNOSIS — M109 Gout, unspecified: Secondary | ICD-10-CM | POA: Diagnosis present

## 2020-08-09 DIAGNOSIS — I10 Essential (primary) hypertension: Secondary | ICD-10-CM

## 2020-08-09 DIAGNOSIS — Z981 Arthrodesis status: Secondary | ICD-10-CM | POA: Diagnosis not present

## 2020-08-09 DIAGNOSIS — M159 Polyosteoarthritis, unspecified: Secondary | ICD-10-CM | POA: Diagnosis present

## 2020-08-09 DIAGNOSIS — Z20822 Contact with and (suspected) exposure to covid-19: Secondary | ICD-10-CM | POA: Diagnosis present

## 2020-08-09 DIAGNOSIS — Z96653 Presence of artificial knee joint, bilateral: Secondary | ICD-10-CM | POA: Diagnosis present

## 2020-08-09 DIAGNOSIS — E785 Hyperlipidemia, unspecified: Secondary | ICD-10-CM | POA: Diagnosis present

## 2020-08-09 DIAGNOSIS — Z86718 Personal history of other venous thrombosis and embolism: Secondary | ICD-10-CM | POA: Diagnosis not present

## 2020-08-09 DIAGNOSIS — Z9049 Acquired absence of other specified parts of digestive tract: Secondary | ICD-10-CM | POA: Diagnosis not present

## 2020-08-09 LAB — BASIC METABOLIC PANEL
Anion gap: 10 (ref 5–15)
BUN: 45 mg/dL — ABNORMAL HIGH (ref 8–23)
CO2: 27 mmol/L (ref 22–32)
Calcium: 8.8 mg/dL — ABNORMAL LOW (ref 8.9–10.3)
Chloride: 97 mmol/L — ABNORMAL LOW (ref 98–111)
Creatinine, Ser: 1.31 mg/dL — ABNORMAL HIGH (ref 0.61–1.24)
GFR, Estimated: 53 mL/min — ABNORMAL LOW (ref >60)
Glucose, Bld: 100 mg/dL — ABNORMAL HIGH (ref 70–99)
Potassium: 4.4 mmol/L (ref 3.5–5.1)
Sodium: 134 mmol/L — ABNORMAL LOW (ref 135–145)

## 2020-08-09 LAB — CBC
HCT: 34.2 % — ABNORMAL LOW (ref 39.0–52.0)
Hemoglobin: 11.4 g/dL — ABNORMAL LOW (ref 13.0–17.0)
MCH: 30 pg (ref 26.0–34.0)
MCHC: 33.3 g/dL (ref 30.0–36.0)
MCV: 90 fL (ref 80.0–100.0)
Platelets: 319 10*3/uL (ref 150–400)
RBC: 3.8 MIL/uL — ABNORMAL LOW (ref 4.22–5.81)
RDW: 14.7 % (ref 11.5–15.5)
WBC: 11 10*3/uL — ABNORMAL HIGH (ref 4.0–10.5)
nRBC: 0 % (ref 0.0–0.2)

## 2020-08-09 LAB — ECHOCARDIOGRAM COMPLETE
Area-P 1/2: 4.29 cm2
S' Lateral: 2.8 cm

## 2020-08-09 MED ORDER — SODIUM CHLORIDE 0.9 % IV SOLN
INTRAVENOUS | Status: DC | PRN
  Administered 2020-08-09 – 2020-08-10 (×2): 250 mL via INTRAVENOUS

## 2020-08-09 NOTE — ED Notes (Signed)
Tele  Breakfast Ordered 

## 2020-08-09 NOTE — ED Notes (Signed)
Lunch Tray Ordered @ 1029. 

## 2020-08-09 NOTE — Progress Notes (Signed)
PROGRESS NOTE  [name] YQM:[id number] DOB: May 14, 1932 DOA: 08/08/2020 PCP: Hoyt Koch, MD  HPI/Recap of past 51 hours: 86 year old male with past medical history of CAD, diastolic CHF, prior DVT and hypertension presented to the emergency room on 12/27 with swelling in his right lower extremity that been going on for months but over the past few days has been having increased redness and even painful which she had not had before.  Seen 10 days prior by his PCP before erythema started and at that time had his diuretic increased which did not help.  In the emergency room, noted to have an elevated creatinine, white count of 15 and signs consistent with sepsis.  Patient received dose of Ancef in the emergency room and admitted to the hospital service.  Covid test was negative.  This morning, white blood cell count down to 11 and creatinine has improved as well.  Right lower extremity still markedly erythematous and tender.  Patient himself quite exhausted as he was up for most of the night and is quite somnolent.  He is unable to give me any clear review of systems.  Assessment/Plan: Principal Problem:   Sepsis ([place]) secondary to right lower extremity cellulitis: Patient met criteria given SIRS and cellulitis source.  White blood cell count improving.  Received some fluids and antibiotics.  Sepsis has since stabilized.  Active Problems:   Hyperlipidemia: Continue statin.    Essential hypertension: Stable, blood pressure little soft.    CAD (coronary artery disease): Stable   Polyarticular arthritis    Chronic diastolic CHF (congestive heart failure) (Progress): Confirmed on echocardiogram done today.  Minimal BNP elevation although that may be in part due to his acute kidney injury.  He does not look euvolemic and if anything dry.     Acute renal failure (HCC) in the setting of stage IIIa chronic kidney disease: Improved following initial fluids.   Code Status: DNR  Family  Communication: Left message for family  Disposition Plan: Anticipate discharge the next few days once cellulitis greatly improves   Consultants:  None  Procedures:  Echocardiogram done 8/89: Diastolic dysfunction  Antimicrobials:  IV Ancef 12/27-present  DVT prophylaxis: Lovenox   Objective: Vitals:   08/09/20 1230 08/09/20 1245  BP: 104/63 (!) 105/56  Pulse: 85 91  Resp: (!) 24 17  Temp:    SpO2: 94% 96%    Intake/Output Summary (Last 24 hours) at 08/09/2020 1345 Last data filed at 08/09/2020 1111 Gross per 24 hour  Intake 3 ml  Output 1000 ml  Net -997 ml   There were no vitals filed for this visit. There is no height or weight on file to calculate BMI.  Exam:   General: Somnolent, no acute distress  HEENT: Soft and atraumatic mucous membranes slightly dry  Cardiovascular: Regular rate and rhythm, S1-S2, 2 out of 6 stock ejection murmur  Respiratory: Clear to auscultation bilaterally  Abdomen: Soft, nontender, nondistended, positive bowel sounds  Musculoskeletal: 2+ pitting edema from the knees on the right as compared to 1+ on left.  No clubbing or cyanosis  Skin: Chronic venous stasis on left lower extremity with erythema on anterior aspect below the knee down to the ankle level on the right lower extremity  Psychiatry: Appropriate, no evidence of psychoses   Data Reviewed: CBC: Recent Labs  Lab 08/08/20 1431 08/09/20 0417  WBC 15.1* 11.0*  HGB 12.5* 11.4*  HCT 39.8 34.2*  MCV 92.1 90.0  PLT 344 319   Basic  Metabolic Panel: Recent Labs  Lab 08/08/20 1431 08/09/20 0417  NA 133* 134*  K 5.1 4.4  CL 94* 97*  CO2 28 27  GLUCOSE 103* 100*  BUN 59* 45*  CREATININE 1.76* 1.31*  CALCIUM 9.5 8.8*   GFR: Estimated Creatinine Clearance: 44.4 mL/min (A) (by C-G formula based on SCr of 1.31 mg/dL (H)). Liver Function Tests: Recent Labs  Lab 08/08/20 1431  AST 19  ALT 20  ALKPHOS 86  BILITOT 0.5  PROT 6.7  ALBUMIN 3.2*   No  results for input(s): LIPASE, AMYLASE in the last 168 hours. No results for input(s): AMMONIA in the last 168 hours. Coagulation Profile: No results for input(s): INR, PROTIME in the last 168 hours. Cardiac Enzymes: No results for input(s): CKTOTAL, CKMB, CKMBINDEX, TROPONINI in the last 168 hours. BNP (last 3 results) Recent Labs    03/29/20 1113 07/29/20 1408  PROBNP 175.0* 222.0*   HbA1C: No results for input(s): HGBA1C in the last 72 hours. CBG: No results for input(s): GLUCAP in the last 168 hours. Lipid Profile: No results for input(s): CHOL, HDL, LDLCALC, TRIG, CHOLHDL, LDLDIRECT in the last 72 hours. Thyroid Function Tests: No results for input(s): TSH, T4TOTAL, FREET4, T3FREE, THYROIDAB in the last 72 hours. Anemia Panel: No results for input(s): VITAMINB12, FOLATE, FERRITIN, TIBC, IRON, RETICCTPCT in the last 72 hours. Urine analysis:    Component Value Date/Time   COLORURINE YELLOW 04/30/2019 [place] 04/30/2019 1033   LABSPEC 1.017 04/30/2019 1033   PHURINE 7.0 04/30/2019 1033   GLUCOSEU NEGATIVE 04/30/2019 1033   GLUCOSEU NEGATIVE 10/03/2018 1036   HGBUR NEGATIVE 04/30/2019 1033   [place] 04/30/2019 1033   BILIRUBINUR negative 10/17/2018 [place] 04/30/2019 1033   PROTEINUR NEGATIVE 04/30/2019 1033   UROBILINOGEN 0.2 10/17/2018 1555   UROBILINOGEN 0.2 10/03/2018 1036   NITRITE NEGATIVE 04/30/2019 1033   LEUKOCYTESUR NEGATIVE 04/30/2019 1033   Sepsis Labs: _0 (procalcitonin:4,lacticidven:4)  )No results found for this or any previous visit (from the past 240 hour(s)).    Studies: US RENAL  Result Date: 08/09/2020 CLINICAL DATA:  Acute kidney injury EXAM: RENAL / URINARY TRACT ULTRASOUND COMPLETE COMPARISON:  None. FINDINGS: Right Kidney: Renal measurements: 11.8 x 6.0 x 5.5 cm = volume: 205 mL. Echogenicity within normal limits. There is diffuse cortical thinning seen. This is shadowing calculi seen  within the midpole measuring 1.1 cm. No hydronephrosis is noted. Left Kidney: Renal measurements: 11.5 x 6.5 x 5.1 cm = volume: 202 mL. Echogenicity within normal limits. There is diffuse cortical thinning seen. Shadowing calculi are noted measuring up to 7 mm. No hydronephrosis. Bladder: Appears normal for degree of bladder distention. Other: None. IMPRESSION: Bilateral nonobstructing renal calculi. Mild cortical thinning. Electronically Signed   By: Prudencio Pair M.D.   On: 08/09/2020 00:30   DG Chest Portable 1 View  Result Date: 08/08/2020 CLINICAL DATA:  Lower extremity edema.  Leg swelling for 1 month. EXAM: PORTABLE CHEST 1 VIEW COMPARISON:  Chest radiograph 03/29/2020 FINDINGS: Normal heart size with unchanged mediastinal contours. Aortic atherosclerosis. No pulmonary edema. Subsegmental atelectasis or scarring at the left lung base. No confluent consolidation. No pneumothorax or significant pleural effusion. Surgical hardware in the lower cervical spine partially included. High-riding humeral heads. No acute osseous abnormalities are seen. IMPRESSION: 1. Subsegmental atelectasis or scarring at the left lung base. 2. No evidence of fluid overload or pulmonary edema. Aortic Atherosclerosis (ICD10-I70.0). Electronically Signed   By: Aurther Loft.D.  On: 08/08/2020 20:51   ECHOCARDIOGRAM COMPLETE  Result Date: 08/09/2020    ECHOCARDIOGRAM REPORT   Patient Name:   [name] Date of Exam: 08/09/2020 Medical Rec #:  [id number]       Height:       72.0 in Accession #:    [id number]      Weight:       200.0 lb Date of Birth:  11/13/1931        BSA:          2.131 m Patient Age:    77 years        BP:           139/74 mmHg Patient Gender: M               HR:           100 bpm. Exam Location:  Inpatient Procedure: 2D Echo, Color Doppler and Cardiac Doppler Indications:    K91.79 Acute diastolic (congestive) heart failure  History:        Patient has prior history of Echocardiogram examinations,  most                 recent 08/10/2018. CHF, CAD; Risk Factors:Hypertension and                 Dyslipidemia.  Sonographer:    Raquel Sarna Senior RDCS Referring Phys: [id number] [place]  1. Left ventricular ejection fraction, by estimation, is 60 to 65%. The left ventricle has normal function. The left ventricle has no regional wall motion abnormalities. Left ventricular diastolic parameters are consistent with Grade I diastolic dysfunction (impaired relaxation).  2. Right ventricular systolic function is normal. The right ventricular size is normal. There is normal pulmonary artery systolic pressure. The estimated right ventricular systolic pressure is 94.8 mmHg.  3. The mitral valve is normal in structure. No evidence of mitral valve regurgitation. No evidence of mitral stenosis.  4. The aortic valve is normal in structure. Aortic valve regurgitation is not visualized. No aortic stenosis is present.  5. The inferior vena cava is normal in size with greater than 50% respiratory variability, suggesting right atrial pressure of 3 mmHg. Comparison(s): No significant change from prior study. Prior images reviewed side by side. FINDINGS  Left Ventricle: Left ventricular ejection fraction, by estimation, is 60 to 65%. The left ventricle has normal function. The left ventricle has no regional wall motion abnormalities. The left ventricular internal cavity size was normal in size. There is  no left ventricular hypertrophy. Left ventricular diastolic parameters are consistent with Grade I diastolic dysfunction (impaired relaxation). Indeterminate filling pressures. Right Ventricle: The right ventricular size is normal. No increase in right ventricular wall thickness. Right ventricular systolic function is normal. There is normal pulmonary artery systolic pressure. The tricuspid regurgitant velocity is 2.67 m/s, and  with an assumed right atrial pressure of 3 mmHg, the estimated right ventricular systolic  pressure is 01.6 mmHg. Left Atrium: Left atrial size was normal in size. Right Atrium: Right atrial size was normal in size. Pericardium: There is no evidence of pericardial effusion. Mitral Valve: The mitral valve is normal in structure. No evidence of mitral valve regurgitation. No evidence of mitral valve stenosis. Tricuspid Valve: The tricuspid valve is normal in structure. Tricuspid valve regurgitation is mild . No evidence of tricuspid stenosis. Aortic Valve: The aortic valve is normal in structure. Aortic valve regurgitation is not visualized. No aortic stenosis is present. Pulmonic Valve: The pulmonic  valve was normal in structure. Pulmonic valve regurgitation is not visualized. No evidence of pulmonic stenosis. Aorta: The aortic root is normal in size and structure. Venous: The inferior vena cava is normal in size with greater than 50% respiratory variability, suggesting right atrial pressure of 3 mmHg. IAS/Shunts: No atrial level shunt detected by color flow Doppler.  LEFT VENTRICLE PLAX 2D LVIDd:         4.10 cm  Diastology LVIDs:         2.80 cm  LV e' medial:    7.51 cm/s LV PW:         1.10 cm  LV E/e' medial:  8.0 LV IVS:        1.00 cm  LV e' lateral:   5.44 cm/s LVOT diam:     2.20 cm  LV E/e' lateral: 11.1 LV SV:         68 LV SV Index:   32 LVOT Area:     3.80 cm  RIGHT VENTRICLE RV S prime:     14.40 cm/s TAPSE (M-mode): 1.8 cm LEFT ATRIUM           Index       RIGHT ATRIUM           Index LA diam:      3.80 cm 1.78 cm/m  RA Area:     19.00 cm LA Vol (A2C): 51.1 ml 23.98 ml/m RA Volume:   49.90 ml  23.42 ml/m LA Vol (A4C): 59.9 ml 28.11 ml/m  AORTIC VALVE LVOT Vmax:   88.80 cm/s LVOT Vmean:  66.200 cm/s LVOT VTI:    0.179 m  AORTA Ao Root diam: 3.50 cm Ao Asc diam:  3.20 cm MITRAL VALVE                TRICUSPID VALVE MV Area (PHT): 4.29 cm     TR Peak grad:   28.5 mmHg MV Decel Time: 177 msec     TR Vmax:        267.00 cm/s MV E velocity: 60.40 cm/s MV A velocity: 100.00 cm/s  SHUNTS MV  E/A ratio:  0.60         Systemic VTI:  0.18 m                             Systemic Diam: 2.20 cm Dani Gobble Croitoru MD Electronically signed by Sanda Klein MD Signature Date/Time: 08/09/2020/9:50:22 AM    Final    VAS [place] LOWER EXTREMITY VENOUS (DVT) (ONLY [facility] & [facility])  Result Date: 08/08/2020  Lower Venous DVT Study Indications: Edema.  Comparison Study: Previous exam 03/30/2020 Performing Technologist: Vonzell Schlatter RVT  Examination Guidelines: A complete evaluation includes B-mode imaging, spectral Doppler, color Doppler, and power Doppler as needed of all accessible portions of each vessel. Bilateral testing is considered an integral part of a complete examination. Limited examinations for reoccurring indications may be performed as noted. The reflux portion of the exam is performed with the patient in reverse Trendelenburg.  +---------+---------------+---------+-----------+----------+--------------+ RIGHT    CompressibilityPhasicitySpontaneityPropertiesThrombus Aging +---------+---------------+---------+-----------+----------+--------------+ CFV      Full           Yes      Yes                                 +---------+---------------+---------+-----------+----------+--------------+ SFJ      Full                                                        +---------+---------------+---------+-----------+----------+--------------+  FV Prox  Full                                                        +---------+---------------+---------+-----------+----------+--------------+ FV Mid   Full                                                        +---------+---------------+---------+-----------+----------+--------------+ FV DistalFull                                                        +---------+---------------+---------+-----------+----------+--------------+ PFV      Full                                                         +---------+---------------+---------+-----------+----------+--------------+ POP      Full           Yes      Yes                                 +---------+---------------+---------+-----------+----------+--------------+ PTV      Full                                                        +---------+---------------+---------+-----------+----------+--------------+ PERO     Full                                                        +---------+---------------+---------+-----------+----------+--------------+   +---------+---------------+---------+-----------+----------+--------------+ LEFT     CompressibilityPhasicitySpontaneityPropertiesThrombus Aging +---------+---------------+---------+-----------+----------+--------------+ CFV      Full           Yes      Yes                                 +---------+---------------+---------+-----------+----------+--------------+ SFJ      Full                                                        +---------+---------------+---------+-----------+----------+--------------+ FV Prox  Full                                                        +---------+---------------+---------+-----------+----------+--------------+  FV Mid   Full                                                        +---------+---------------+---------+-----------+----------+--------------+ FV DistalFull                                                        +---------+---------------+---------+-----------+----------+--------------+ PFV      Full                                                        +---------+---------------+---------+-----------+----------+--------------+ POP      Full           Yes      Yes                                 +---------+---------------+---------+-----------+----------+--------------+ PTV      Full                                                         +---------+---------------+---------+-----------+----------+--------------+ PERO     Full                                                        +---------+---------------+---------+-----------+----------+--------------+ SSV      Partial                                      Chronic        +---------+---------------+---------+-----------+----------+--------------+     Summary: RIGHT: - There is no evidence of deep vein thrombosis in the lower extremity.  - No cystic structure found in the popliteal fossa.  LEFT: - Known chronic superficial vein thrombosis involving the left small saphenous vein. - There is no evidence of deep vein thrombosis in the lower extremity.  - No cystic structure found in the popliteal fossa. - Known Chronic SSV found 03/2020  *See table(s) above for measurements and observations. Electronically signed by Ruta Hinds MD on 08/08/2020 at 5:21:53 PM.    Final     Scheduled Meds: . amLODipine  10 mg Oral Daily  . carvedilol  25 mg Oral BID WC  . enoxaparin (LOVENOX) injection  40 mg Subcutaneous Q24H  . sodium chloride flush  3 mL Intravenous Q12H  . tamsulosin  0.4 mg Oral Daily    Continuous Infusions: .  ceFAZolin (ANCEF) IV Stopped (08/09/20 0307)     LOS: 0 days     Annita Brod, MD [facility]   08/09/2020, 1:45 PM

## 2020-08-09 NOTE — Progress Notes (Signed)
Echocardiogram 2D Echocardiogram has been performed.  Warren Lacy [name] 08/09/2020, 8:43 AM

## 2020-08-09 NOTE — Plan of Care (Signed)
  Problem: Education: Goal: Ability to demonstrate management of disease process will improve Outcome: Progressing   Problem: Education: Goal: Ability to verbalize understanding of medication therapies will improve Outcome: Progressing   Problem: Education: Goal: Knowledge of General Education information will improve Description: Including pain rating scale, medication(s)/side effects and non-pharmacologic comfort measures Outcome: Progressing   

## 2020-08-10 ENCOUNTER — Telehealth: Payer: Self-pay | Admitting: Internal Medicine

## 2020-08-10 DIAGNOSIS — I5032 Chronic diastolic (congestive) heart failure: Secondary | ICD-10-CM | POA: Diagnosis not present

## 2020-08-10 DIAGNOSIS — N179 Acute kidney failure, unspecified: Secondary | ICD-10-CM | POA: Diagnosis not present

## 2020-08-10 DIAGNOSIS — A419 Sepsis, unspecified organism: Secondary | ICD-10-CM | POA: Diagnosis not present

## 2020-08-10 DIAGNOSIS — L03115 Cellulitis of right lower limb: Secondary | ICD-10-CM | POA: Diagnosis not present

## 2020-08-10 LAB — CBC
HCT: 32.6 % — ABNORMAL LOW (ref 39.0–52.0)
Hemoglobin: 10.3 g/dL — ABNORMAL LOW (ref 13.0–17.0)
MCH: 29.3 pg (ref 26.0–34.0)
MCHC: 31.6 g/dL (ref 30.0–36.0)
MCV: 92.9 fL (ref 80.0–100.0)
Platelets: 295 10*3/uL (ref 150–400)
RBC: 3.51 MIL/uL — ABNORMAL LOW (ref 4.22–5.81)
RDW: 14.6 % (ref 11.5–15.5)
WBC: 9.2 10*3/uL (ref 4.0–10.5)
nRBC: 0 % (ref 0.0–0.2)

## 2020-08-10 LAB — BASIC METABOLIC PANEL
Anion gap: 7 (ref 5–15)
BUN: 28 mg/dL — ABNORMAL HIGH (ref 8–23)
CO2: 25 mmol/L (ref 22–32)
Calcium: 8.3 mg/dL — ABNORMAL LOW (ref 8.9–10.3)
Chloride: 102 mmol/L (ref 98–111)
Creatinine, Ser: 1.08 mg/dL (ref 0.61–1.24)
GFR, Estimated: >60 mL/min (ref >60)
Glucose, Bld: 102 mg/dL — ABNORMAL HIGH (ref 70–99)
Potassium: 4.5 mmol/L (ref 3.5–5.1)
Sodium: 134 mmol/L — ABNORMAL LOW (ref 135–145)

## 2020-08-10 LAB — SARS CORONAVIRUS 2 (TAT 6-24 HRS): SARS Coronavirus 2: NEGATIVE

## 2020-08-10 MED ORDER — FUROSEMIDE 10 MG/ML IJ SOLN
20.0000 mg | Freq: Once | INTRAMUSCULAR | Status: AC
  Administered 2020-08-10: 10:00:00 20 mg via INTRAVENOUS
  Filled 2020-08-10: qty 2

## 2020-08-10 MED ORDER — OXYCODONE HCL 5 MG PO TABS
7.5000 mg | ORAL_TABLET | Freq: Four times a day (QID) | ORAL | Status: DC
Start: 2020-08-10 — End: 2020-08-10
  Filled 2020-08-10: qty 2

## 2020-08-10 MED ORDER — OXYCODONE HCL 5 MG PO TABS
10.0000 mg | ORAL_TABLET | Freq: Four times a day (QID) | ORAL | Status: DC
Start: 2020-08-10 — End: 2020-08-11
  Administered 2020-08-10 – 2020-08-11 (×4): 10 mg via ORAL
  Filled 2020-08-10 (×4): qty 2

## 2020-08-10 NOTE — Progress Notes (Signed)
Patient was adamant that the pain medication dosage of 7.5 mg oxycodone was not adequate. MD notified. MD changed dose to 10 mg. Returned 5 mg of 7.5 dose and wasted 2.5 mg in stericycle. Witnessed by Delorise Shiner, RN.

## 2020-08-10 NOTE — Plan of Care (Signed)
  Problem: Activity: Goal: Capacity to carry out activities will improve 08/10/2020 1447 by Caswell Corwin, RN Outcome: Progressing 08/10/2020 1446 by Caswell Corwin, RN Outcome: Progressing   Problem: Nutrition: Goal: Adequate nutrition will be maintained 08/10/2020 1447 by Caswell Corwin, RN Outcome: Progressing 08/10/2020 1446 by Caswell Corwin, RN Outcome: Progressing   Problem: Pain Managment: Goal: General experience of comfort will improve Outcome: Progressing

## 2020-08-10 NOTE — Telephone Encounter (Signed)
   Call from [facility] at [facility] stating spouse called to report patient is hospitalized. Appointment for 12/30 canceled

## 2020-08-10 NOTE — Plan of Care (Signed)
  Problem: Education: Goal: Ability to demonstrate management of disease process will improve Outcome: Progressing   Problem: Education: Goal: Knowledge of General Education information will improve Description: Including pain rating scale, medication(s)/side effects and non-pharmacologic comfort measures Outcome: Progressing   

## 2020-08-10 NOTE — Evaluation (Signed)
Physical Therapy Evaluation Patient Details Name: [name] MRN: [id number] DOB: 14-Feb-1932 Today's Date: 08/10/2020   History of Present Illness  86yo male c/o BLE pain and skin changes, pain. DVT negative. Admitted with cellulitis, acute renal failure. PMH CAD, chronic neck and back pain, hx DVT, gout, HLD, HTN, TKR, back surgery, T12/L1/L2  kyphoplasty 06/10/20, posterior cervical surgery  Clinical Impression   Patient received in bed, jovial and pleasant but at the same time seems possibly a bit confused- insistent that his back surgery was just recently and he will "be fine once that heals", but per chart it was actually 2 months ago this year; had quite a hard time with functional problem solving and sequencing as well. Needed Min-ModA for bed mobility and transfers, but did well with the RW once up on his feet, although gait distance was limited by pain. Left in bed positioned to comfort with all needs met, bed alarm active. 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     Follow Up Recommendations SNF;Supervision/Assistance - 24 hour    Equipment Recommendations  Rolling walker with 5" wheels;3in1 (PT)    Recommendations for Other Services       Precautions / Restrictions Precautions Precautions: Fall;Other (comment) Precaution Comments: kyphoplasty 06/10/20- back precautions for safety/comfort Restrictions Weight Bearing Restrictions: No      Mobility  Bed Mobility Overal bed mobility: Needs Assistance Bed Mobility: Rolling;Sidelying to Sit;Sit to Sidelying Rolling: Min guard Sidelying to sit: Mod assist     Sit to sidelying: Mod assist General bed mobility comments: at first tried to throw BLEs off bed while he was flat on his back and do a "sit-up" and could not figure out how to get up otherwise; needed cues from PT for rolling/side to sit for good mechanics for his back    Transfers Overall transfer level:  Needs assistance Equipment used: Rolling walker (2 wheeled) Transfers: Sit to/from Stand Sit to Stand: Min assist         General transfer comment: MinA to boost to full upright and gain balance, cues for hand placement and sequencing  Ambulation/Gait Ambulation/Gait assistance: Min guard Gait Distance (Feet): 25 Feet Assistive device: Rolling walker (2 wheeled) Gait Pattern/deviations: Step-through pattern;Decreased step length - right;Decreased step length - left;Decreased stride length;Trunk flexed Gait velocity: decreased   General Gait Details: slow but steady with RW, gait distance limited by pain  Stairs            Wheelchair Mobility    Modified Rankin (Stroke Patients Only)       Balance Overall balance assessment: Needs assistance Sitting-balance support: Bilateral upper extremity supported;Feet supported Sitting balance-Leahy Scale: Fair     Standing balance support: Bilateral upper extremity supported;During functional activity Standing balance-Leahy Scale: Fair Standing balance comment: reliant on BUE support                             Pertinent Vitals/Pain Pain Assessment: Faces Faces Pain Scale: Hurts whole lot Pain Location: back and hips with movement Pain Descriptors / Indicators: Discomfort;Grimacing;Tightness Pain Intervention(s): Limited activity within patient's tolerance;Monitored during session    [place] expects to be discharged to:: Private residence Living Arrangements: Spouse/significant other Available Help at Discharge: Family (son and niece live close by) Type of Home: House Home Access: Elevator     Home Layout: One level Home Equipment: Environmental consultant - 2  wheels      Prior Function Level of Independence: Independent         Comments: Pt reports he is the caregiver for his wife, reports no use of AD PTA but states frequent falls due to L knee buckling; patient a little unclear today, so some  history taken from prior charting     Hand Dominance   Dominant Hand: Right    Extremity/Trunk Assessment   Upper Extremity Assessment Upper Extremity Assessment: Defer to OT evaluation    Lower Extremity Assessment Lower Extremity Assessment: Generalized weakness    Cervical / Trunk Assessment Cervical / Trunk Assessment: Kyphotic  Communication   Communication: No difficulties  Cognition Arousal/Alertness: Awake/alert Behavior During Therapy: WFL for tasks assessed/performed Overall Cognitive Status: Impaired/Different from baseline Area of Impairment: Memory;Safety/judgement;Awareness;Problem solving                     Memory: Decreased short-term memory   Safety/Judgement: Decreased awareness of safety;Decreased awareness of deficits Awareness: Emergent Problem Solving: Difficulty sequencing;Requires verbal cues General Comments: keeps telling me he literally just had back surgery (but per chart this was 2 months ago); he is confident "I will be OK at home" but needs at least mod cues to problem solve through bed mobility and transfers      General Comments      Exercises     Assessment/Plan    PT Assessment Patient needs continued PT services  PT Problem List Decreased strength;Decreased activity tolerance;Decreased safety awareness;Decreased balance;Decreased knowledge of precautions;Decreased mobility;Decreased coordination       PT Treatment Interventions DME instruction;Balance training;Gait training;Functional mobility training;Patient/family education;Therapeutic activities;Therapeutic exercise    PT Goals (Current goals can be found in the Care Plan section)  Acute Rehab PT Goals Patient Stated Goal: go to rehab if it will help, less pain PT Goal Formulation: With patient Time For Goal Achievement: 08/24/20 Potential to Achieve Goals: Fair    Frequency Min 3X/week   Barriers to discharge Decreased caregiver support he is the caregiver  for his wife, family lives nearby but unsure if they could give 24/7 support    Co-evaluation               AM-PAC PT "6 Clicks" Mobility  Outcome Measure Help needed turning from your back to your side while in a flat bed without using bedrails?: A Little Help needed moving from lying on your back to sitting on the side of a flat bed without using bedrails?: A Lot Help needed moving to and from a bed to a chair (including a wheelchair)?: A Little Help needed standing up from a chair using your arms (e.g., wheelchair or bedside chair)?: A Little Help needed to walk in hospital room?: A Little Help needed climbing 3-5 steps with a railing? : A Lot 6 Click Score: 16    End of Session Equipment Utilized During Treatment: Gait belt Activity Tolerance: Patient tolerated treatment well Patient left: in bed;with call bell/phone within reach;with bed alarm set Nurse Communication: Mobility status PT Visit Diagnosis: Difficulty in walking, not elsewhere classified (R26.2);Other abnormalities of gait and mobility (R26.89);Muscle weakness (generalized) (M62.81);Pain Pain - Right/Left:  (back and B hips) Pain - part of body: Hip (back and B hips)    Time: 0277-4128 PT Time Calculation (min) (ACUTE ONLY): 21 min   Charges:   PT Evaluation $PT Eval Moderate Complexity: 1 Mod         Jeffrey Graefe U PT, DPT, PN1   Supplemental  Physical Therapist [place] [facility]    Pager [phone] Acute Rehab Office [phone]    

## 2020-08-10 NOTE — Telephone Encounter (Signed)
He is admitted, will done this.

## 2020-08-10 NOTE — Progress Notes (Signed)
PROGRESS NOTE  [name] WNI:[id number] DOB: Mar 06, 1932 DOA: 08/08/2020 PCP: Hoyt Koch, MD  HPI/Recap of past 80 hours: 86 year old male with past medical history of CAD, diastolic CHF, prior DVT and hypertension presented to the emergency room on 12/27 with swelling in his right lower extremity that been going on for months but over the past few days has been having increased redness and even painful which she had not had before.  Seen 10 days prior by his PCP before erythema started and at that time had his diuretic increased which did not help.  In the emergency room, noted to have an elevated creatinine, white count of 15 and signs consistent with sepsis.  Patient received dose of Ancef in the emergency room and admitted to the hospital service.  Covid test was negative.  Since admission, white count and renal function have continued to improve with sepsis stabilizing.  Patient's cellulitis looks to be slowly improving with his biggest complaint being of pain in his hips and back-chronic  Assessment/Plan: Principal Problem:   Sepsis ([place]) secondary to right lower extremity cellulitis: Patient met criteria given SIRS and cellulitis source.  White blood cell count normalized and sepsis resolved.  Have stopped fluids given CHF.  Continuing antibiotics.  Active Problems:   Hyperlipidemia: Continue statin.    Essential hypertension: Stable, blood pressure little soft.    CAD (coronary artery disease): Stable   Polyarticular arthritis: Resuming patient's home pain medications.    Chronic diastolic CHF (congestive heart failure) ([place]): Confirmed on echocardiogram done today.  Minimal BNP elevation although that may be in part due to his acute kidney injury.  Give 1 dose of Lasix today.     Acute renal failure ([facility]) in the setting of stage II chronic kidney disease: Improved following initial fluids.  Give 1 dose of Lasix.  Back to baseline.   Code Status: DNR  Family  Communication: Left message for family  Disposition Plan: Anticipate discharge hopefully tomorrow.  Outstanding issues include improvement in cellulitis and to be seen by physical therapy.  Consultants:  None  Procedures:  Echocardiogram done 3/81: Diastolic dysfunction  Antimicrobials:  IV Ancef 12/27-present  DVT prophylaxis: Lovenox   Objective: Vitals:   08/10/20 0640 08/10/20 0935  BP: (!) 123/98 105/70  Pulse:    Resp:    Temp:    SpO2:      Intake/Output Summary (Last 24 hours) at 08/10/2020 1121 Last data filed at 08/10/2020 1007 Gross per 24 hour  Intake 809.49 ml  Output 1575 ml  Net -765.51 ml   Filed Weights   08/10/20 0413  Weight: 87.7 kg   Body mass index is 26.22 kg/m.  Exam:   General: Alert and oriented x3, mild distress secondary to hip and back pain  HEENT: Soft and atraumatic mucous membranes slightly dry  Cardiovascular: Regular rate and rhythm, S1-S2, 2 out of 6 systolic ejection murmur  Respiratory: Clear to auscultation bilaterally  Abdomen: Soft, nontender, nondistended, positive bowel sounds  Musculoskeletal: 1+ pitting edema from the knees on the right as compared to trace on left.  No clubbing or cyanosis  Skin: Chronic venous stasis on left lower extremity with improving erythema on anterior aspect below the knee down to the ankle level on the right lower extremity  Psychiatry: Appropriate, no evidence of psychoses   Data Reviewed: CBC: Recent Labs  Lab 08/08/20 1431 08/09/20 0417 08/10/20 0355  WBC 15.1* 11.0* 9.2  HGB 12.5* 11.4* 10.3*  HCT 39.8 34.2*  32.6*  MCV 92.1 90.0 92.9  PLT 344 319 741   Basic Metabolic Panel: Recent Labs  Lab 08/08/20 1431 08/09/20 0417 08/10/20 0355  NA 133* 134* 134*  K 5.1 4.4 4.5  CL 94* 97* 102  CO2 _0 GLUCOSE 103* 100* 102*  BUN 59* 45* 28*  CREATININE 1.76* 1.31* 1.08  CALCIUM 9.5 8.8* 8.3*   GFR: Estimated Creatinine Clearance: 53.9 mL/min (by C-G  formula based on SCr of 1.08 mg/dL). Liver Function Tests: Recent Labs  Lab 08/08/20 1431  AST 19  ALT 20  ALKPHOS 86  BILITOT 0.5  PROT 6.7  ALBUMIN 3.2*   No results for input(s): LIPASE, AMYLASE in the last 168 hours. No results for input(s): AMMONIA in the last 168 hours. Coagulation Profile: No results for input(s): INR, PROTIME in the last 168 hours. Cardiac Enzymes: No results for input(s): CKTOTAL, CKMB, CKMBINDEX, TROPONINI in the last 168 hours. BNP (last 3 results) Recent Labs    03/29/20 1113 07/29/20 1408  PROBNP 175.0* 222.0*   HbA1C: No results for input(s): HGBA1C in the last 72 hours. CBG: No results for input(s): GLUCAP in the last 168 hours. Lipid Profile: No results for input(s): CHOL, HDL, LDLCALC, TRIG, CHOLHDL, LDLDIRECT in the last 72 hours. Thyroid Function Tests: No results for input(s): TSH, T4TOTAL, FREET4, T3FREE, THYROIDAB in the last 72 hours. Anemia Panel: No results for input(s): VITAMINB12, FOLATE, FERRITIN, TIBC, IRON, RETICCTPCT in the last 72 hours. Urine analysis:    Component Value Date/Time   COLORURINE YELLOW 04/30/2019 [place] 04/30/2019 1033   LABSPEC 1.017 04/30/2019 1033   PHURINE 7.0 04/30/2019 1033   GLUCOSEU NEGATIVE 04/30/2019 1033   GLUCOSEU NEGATIVE 10/03/2018 1036   HGBUR NEGATIVE 04/30/2019 1033   BILIRUBINUR NEGATIVE 04/30/2019 1033   BILIRUBINUR negative 10/17/2018 1555   KETONESUR NEGATIVE 04/30/2019 1033   PROTEINUR NEGATIVE 04/30/2019 1033   UROBILINOGEN 0.2 10/17/2018 1555   UROBILINOGEN 0.2 10/03/2018 1036   NITRITE NEGATIVE 04/30/2019 1033   LEUKOCYTESUR NEGATIVE 04/30/2019 1033   Sepsis Labs: _1 (procalcitonin:4,lacticidven:4)  ) Recent Results (from the past 240 hour(s))  SARS CORONAVIRUS 2 (TAT 6-24 HRS) Nasopharyngeal Nasopharyngeal Swab     Status: None   Collection Time: 08/09/20 10:22 PM   Specimen: Nasopharyngeal Swab  Result Value Ref Range Status   SARS  Coronavirus 2 NEGATIVE NEGATIVE Final    Comment: (NOTE) SARS-CoV-2 target nucleic acids are NOT DETECTED.  The SARS-CoV-2 RNA is generally detectable in upper and lower respiratory specimens during the acute phase of infection. Negative results do not preclude SARS-CoV-2 infection, do not rule out co-infections with other pathogens, and should not be used as the sole basis for treatment or other patient management decisions. Negative results must be combined with clinical observations, patient history, and epidemiological information. The expected result is Negative.  Fact Sheet for Patients: SugarRoll.be  Fact Sheet for Healthcare Providers: https://www.woods-mathews.com/  This test is not yet approved or cleared by the [place] FDA and  has been authorized for detection and/or diagnosis of SARS-CoV-2 by FDA under an Emergency Use Authorization (EUA). This EUA will remain  in effect (meaning this test can be used) for the duration of the COVID-19 declaration under Se ction 564(b)(1) of the Act, 21 U.S.C. section 360bbb-3(b)(1), unless the authorization is terminated or revoked sooner.  Performed at [facility], [place] [address] [place], [place] [address]       Studies: No results found.  Scheduled  Meds:  amLODipine  10 mg Oral Daily   carvedilol  25 mg Oral BID WC   enoxaparin (LOVENOX) injection  40 mg Subcutaneous Q24H   oxyCODONE  7.5 mg Oral QID   sodium chloride flush  3 mL Intravenous Q12H   tamsulosin  0.4 mg Oral Daily    Continuous Infusions:  sodium chloride 250 mL (08/10/20 0639)    ceFAZolin (ANCEF) IV 1 g (08/10/20 0641)     LOS: 1 day     Annita Brod, MD [facility]   08/10/2020, 11:21 AM

## 2020-08-11 ENCOUNTER — Ambulatory Visit: Payer: Medicare Other | Admitting: Internal Medicine

## 2020-08-11 DIAGNOSIS — A419 Sepsis, unspecified organism: Secondary | ICD-10-CM | POA: Diagnosis not present

## 2020-08-11 DIAGNOSIS — I10 Essential (primary) hypertension: Secondary | ICD-10-CM | POA: Diagnosis not present

## 2020-08-11 DIAGNOSIS — N179 Acute kidney failure, unspecified: Secondary | ICD-10-CM | POA: Diagnosis not present

## 2020-08-11 DIAGNOSIS — I5032 Chronic diastolic (congestive) heart failure: Secondary | ICD-10-CM | POA: Diagnosis not present

## 2020-08-11 LAB — BASIC METABOLIC PANEL
Anion gap: 9 (ref 5–15)
BUN: 22 mg/dL (ref 8–23)
CO2: 25 mmol/L (ref 22–32)
Calcium: 8.7 mg/dL — ABNORMAL LOW (ref 8.9–10.3)
Chloride: 99 mmol/L (ref 98–111)
Creatinine, Ser: 1.16 mg/dL (ref 0.61–1.24)
GFR, Estimated: >60 mL/min (ref >60)
Glucose, Bld: 97 mg/dL (ref 70–99)
Potassium: 4.4 mmol/L (ref 3.5–5.1)
Sodium: 133 mmol/L — ABNORMAL LOW (ref 135–145)

## 2020-08-11 LAB — CBC
HCT: 33.9 % — ABNORMAL LOW (ref 39.0–52.0)
Hemoglobin: 11.1 g/dL — ABNORMAL LOW (ref 13.0–17.0)
MCH: 29.9 pg (ref 26.0–34.0)
MCHC: 32.7 g/dL (ref 30.0–36.0)
MCV: 91.4 fL (ref 80.0–100.0)
Platelets: 306 10*3/uL (ref 150–400)
RBC: 3.71 MIL/uL — ABNORMAL LOW (ref 4.22–5.81)
RDW: 14.5 % (ref 11.5–15.5)
WBC: 7.9 10*3/uL (ref 4.0–10.5)
nRBC: 0 % (ref 0.0–0.2)

## 2020-08-11 MED ORDER — AMOXICILLIN-POT CLAVULANATE 875-125 MG PO TABS: 1.0000 | ORAL_TABLET | Freq: Two times a day (BID) | ORAL | 0 refills | Status: AC

## 2020-08-11 MED ORDER — HYDROMORPHONE HCL 1 MG/ML IJ SOLN
1.0000 mg | Freq: Once | INTRAMUSCULAR | Status: AC | PRN
  Administered 2020-08-11: 1 mg via INTRAVENOUS
  Filled 2020-08-11: qty 1

## 2020-08-11 NOTE — Plan of Care (Signed)

## 2020-08-11 NOTE — Discharge Summary (Signed)
Discharge Summary  [name] HBZ:[id number] DOB: 1932-02-26  PCP: Hoyt Koch, MD  Admit date: 08/08/2020 Discharge date: 08/11/2020  Time spent: 25 minutes  Recommendations for Outpatient Follow-up:  1. New medication: Augmentin 875 p.o. twice daily x5 more days 2. Patient will follow up with his PCP in the next 2 weeks  Discharge Diagnoses:  Active Hospital Problems   Diagnosis Date Noted  . Sepsis ([place]) 08/09/2020  . Cellulitis 08/08/2020  . Acute renal failure ([place]) 08/08/2020  . Cellulitis of leg, right 02/03/2020  . Chronic diastolic CHF (congestive heart failure) ([place]) 08/10/2018  . Polyarticular arthritis 07/19/2013  . Hyperlipidemia 10/18/2008  . CAD (coronary artery disease) 08/18/2007  . Essential hypertension 05/09/2007    Resolved Hospital Problems  No resolved problems to display.    Discharge Condition: Improved, being discharged home  Diet recommendation: Heart healthy  Vitals:   08/11/20 0304 08/11/20 0902  BP: 139/85 124/63  Pulse: (!) 103 97  Resp: 16 (!) 21  Temp: 98.5 F (36.9 C) 98.4 F (36.9 C)  SpO2: 95% 96%    History of present illness:  86 year old male with past medical history of CAD, diastolic CHF, prior DVT and hypertension presented to the emergency room on 12/27 with swelling in his right lower extremity that been going on for months but over the past few days has been having increased redness and even painful which she had not had before.  Seen 10 days prior by his PCP before erythema started and at that time had his diuretic increased which did not help.  In the emergency room, noted to have an elevated creatinine, white count of 15 and signs consistent with sepsis.  Patient received dose of Ancef in the emergency room and admitted to the hospital service.  Covid test was negative.   Hospital Course:  Principal Problem:   Sepsis ([place]) secondary to right lower extremity cellulitis: Patient met criteria given SIRS and  cellulitis source.  White blood cell count normalized and sepsis resolved.  Have stopped fluids given CHF.    Will transition over to p.o. Augmentin x5 more days.  Active Problems:   Hyperlipidemia: Continue statin.    Essential hypertension: Stable, blood pressure initially soft, improved.    CAD (coronary artery disease): Stable    Polyarticular arthritis: Resuming patient's home pain medications.  He takes his as needed OxyContin at home on a scheduled basis.    Chronic diastolic CHF (congestive heart failure) ([place]): Confirmed on echocardiogram done today.  Minimal BNP elevation although that may be in part due to his acute kidney injury.  Give 1 dose of Lasix today.     AKI/acute renal failure (H[facility]) in the setting of stage II chronic kidney disease: Improved following initial fluids.  Give 1 dose of Lasix.  Back to baseline.  Consultants:  None  Procedures:  Echocardiogram done 10/17: Diastolic dysfunction  Discharge Exam: BP 124/63 (BP Location: Left Arm)   Pulse 97   Temp 98.4 F (36.9 C) (Oral)   Resp (!) 21   Wt 85.4 kg Comment: scale c  SpO2 96%   BMI 25.52 kg/m   General: Alert and oriented x3 Cardiovascular: Regular rate and rhythm, S1-S2 Respiratory: Clear to auscultation bilaterally  Discharge Instructions You were cared for by a hospitalist during your hospital stay. If you have any questions about your discharge medications or the care you received while you were in the hospital after you are discharged, you can call the unit and asked  to speak with the hospitalist on call if the hospitalist that took care of you is not available. Once you are discharged, your primary care physician will handle any further medical issues. Please note that NO REFILLS for any discharge medications will be authorized once you are discharged, as it is imperative that you return to your primary care physician (or establish a relationship with a primary care physician if you do  not have one) for your aftercare needs so that they can reassess your need for medications and monitor your lab values.   Allergies as of 08/11/2020      Reactions   Lyrica [pregabalin] Swelling   Taking sometime as needed now   Gabapentin Other (See Comments)   Lower extremity edema      Medication List    TAKE these medications   amLODipine 10 MG tablet Commonly known as: NORVASC TAKE 1 TABLET BY MOUTH EVERY DAY   amoxicillin-clavulanate 875-125 MG tablet Commonly known as: Augmentin Take 1 tablet by mouth 2 (two) times daily for 5 days.   carvedilol 25 MG tablet Commonly known as: COREG TAKE 1 TABLET BY MOUTH 2 TIMES DAILY WITH A MEAL What changed: See the new instructions.   fluticasone 50 MCG/ACT nasal spray Commonly known as: FLONASE Place 1 spray into both nostrils daily.   hydrALAZINE 50 MG tablet Commonly known as: APRESOLINE TAKE 1 TABLET BY MOUTH TWICE A DAY   Ketorolac Tromethamine 15.75 MG/SPRAY Soln Place 1 spray into the nose 3 (three) times daily as needed.   magnesium citrate Soln Take 0.5 Bottles by mouth daily as needed for moderate constipation.   ondansetron 8 MG disintegrating tablet Commonly known as: ZOFRAN-ODT TAKE 1 TABLET (8 MG TOTAL) BY MOUTH EVERY 8 (EIGHT) HOURS AS NEEDED FOR NAUSEA OR VOMITING.   Oxycodone HCl 10 MG Tabs Take 1 tablet (10 mg total) by mouth every 4 (four) hours as needed (pain score 4-6). What changed: when to take this   predniSONE 10 MG tablet Commonly known as: DELTASONE TAKE 1-4 TABLETS (10-40 MG TOTAL) BY MOUTH DAILY WITH BREAKFAST. What changed: See the new instructions.   pregabalin 75 MG capsule Commonly known as: Lyrica Take 1 capsule (75 mg total) by mouth 2 (two) times daily. What changed:   when to take this  reasons to take this   spironolactone 25 MG tablet Commonly known as: ALDACTONE TAKE 1 TABLET BY MOUTH EVERY DAY   tamsulosin 0.4 MG Caps capsule Commonly known as: FLOMAX TAKE 1  CAPSULE BY MOUTH EVERY DAY What changed: when to take this   torsemide 20 MG tablet Commonly known as: Demadex Take 1-2 tablets (20-40 mg total) by mouth daily. What changed:   how much to take  when to take this  reasons to take this       Allergies  Allergen Reactions  . Lyrica [Pregabalin] Swelling    Taking sometime as needed now  . Gabapentin Other (See Comments)    Lower extremity edema      The results of significant diagnostics from this hospitalization (including imaging, microbiology, ancillary and laboratory) are listed below for reference.    Significant Diagnostic Studies: CT HEAD WO CONTRAST  Result Date: 07/13/2020 CLINICAL DATA:  Poor balance with unsteady gait for 6-8 months. EXAM: CT HEAD WITHOUT CONTRAST TECHNIQUE: Contiguous axial images were obtained from the base of the skull through the vertex without intravenous contrast. COMPARISON:  None. FINDINGS: Brain: There is no evidence of an acute infarct, intracranial  hemorrhage, mass, midline shift, or extra-axial fluid collection. Mild cerebral atrophy is likely within normal limits for age. Vascular: Calcified atherosclerosis at the skull base. No hyperdense vessel. Skull: No fracture or suspicious osseous lesion. Sinuses/Orbits: Bilateral cataract extraction. Evidence of chronic maxillary sinusitis with small maxillary sinuses bilaterally and with the right being completely opacified. Mild left ethmoid and sphenoid sinus mucosal thickening. Clear mastoid air cells. Other: None. IMPRESSION: No evidence of acute intracranial abnormality. Electronically Signed   By: Logan Bores M.D.   On: 07/13/2020 11:03   US RENAL  Result Date: 08/09/2020 CLINICAL DATA:  Acute kidney injury EXAM: RENAL / URINARY TRACT ULTRASOUND COMPLETE COMPARISON:  None. FINDINGS: Right Kidney: Renal measurements: 11.8 x 6.0 x 5.5 cm = volume: 205 mL. Echogenicity within normal limits. There is diffuse cortical thinning seen. This is  shadowing calculi seen within the midpole measuring 1.1 cm. No hydronephrosis is noted. Left Kidney: Renal measurements: 11.5 x 6.5 x 5.1 cm = volume: 202 mL. Echogenicity within normal limits. There is diffuse cortical thinning seen. Shadowing calculi are noted measuring up to 7 mm. No hydronephrosis. Bladder: Appears normal for degree of bladder distention. Other: None. IMPRESSION: Bilateral nonobstructing renal calculi. Mild cortical thinning. Electronically Signed   By: Prudencio Pair M.D.   On: 08/09/2020 00:30   DG Chest Portable 1 View  Result Date: 08/08/2020 CLINICAL DATA:  Lower extremity edema.  Leg swelling for 1 month. EXAM: PORTABLE CHEST 1 VIEW COMPARISON:  Chest radiograph 03/29/2020 FINDINGS: Normal heart size with unchanged mediastinal contours. Aortic atherosclerosis. No pulmonary edema. Subsegmental atelectasis or scarring at the left lung base. No confluent consolidation. No pneumothorax or significant pleural effusion. Surgical hardware in the lower cervical spine partially included. High-riding humeral heads. No acute osseous abnormalities are seen. IMPRESSION: 1. Subsegmental atelectasis or scarring at the left lung base. 2. No evidence of fluid overload or pulmonary edema. Aortic Atherosclerosis (ICD10-I70.0). Electronically Signed   By: Keith Rake M.D.   On: 08/08/2020 20:51   ECHOCARDIOGRAM COMPLETE  Result Date: 08/09/2020    ECHOCARDIOGRAM REPORT   Patient Name:   NIV DARLEY Date of Exam: 08/09/2020 Medical Rec #:  [id number]       Height:       72.0 in Accession #:    [id number]      Weight:       200.0 lb Date of Birth:  02-11-1932        BSA:          2.131 m Patient Age:    86 years        BP:           139/74 mmHg Patient Gender: M               HR:           100 bpm. Exam Location:  Inpatient Procedure: 2D Echo, Color Doppler and Cardiac Doppler Indications:    Z00.17 Acute diastolic (congestive) heart failure  History:        Patient has prior history of  Echocardiogram examinations, most                 recent 08/10/2018. CHF, CAD; Risk Factors:Hypertension and                 Dyslipidemia.  Sonographer:    Raquel Sarna Senior RDCS Referring Phys: [id number] [place]  1. Left ventricular ejection fraction, by estimation, is 60 to 65%. The left ventricle has  normal function. The left ventricle has no regional wall motion abnormalities. Left ventricular diastolic parameters are consistent with Grade I diastolic dysfunction (impaired relaxation).  2. Right ventricular systolic function is normal. The right ventricular size is normal. There is normal pulmonary artery systolic pressure. The estimated right ventricular systolic pressure is 98.3 mmHg.  3. The mitral valve is normal in structure. No evidence of mitral valve regurgitation. No evidence of mitral stenosis.  4. The aortic valve is normal in structure. Aortic valve regurgitation is not visualized. No aortic stenosis is present.  5. The inferior vena cava is normal in size with greater than 50% respiratory variability, suggesting right atrial pressure of 3 mmHg. Comparison(s): No significant change from prior study. Prior images reviewed side by side. FINDINGS  Left Ventricle: Left ventricular ejection fraction, by estimation, is 60 to 65%. The left ventricle has normal function. The left ventricle has no regional wall motion abnormalities. The left ventricular internal cavity size was normal in size. There is  no left ventricular hypertrophy. Left ventricular diastolic parameters are consistent with Grade I diastolic dysfunction (impaired relaxation). Indeterminate filling pressures. Right Ventricle: The right ventricular size is normal. No increase in right ventricular wall thickness. Right ventricular systolic function is normal. There is normal pulmonary artery systolic pressure. The tricuspid regurgitant velocity is 2.67 m/s, and  with an assumed right atrial pressure of 3 mmHg, the estimated  right ventricular systolic pressure is 38.2 mmHg. Left Atrium: Left atrial size was normal in size. Right Atrium: Right atrial size was normal in size. Pericardium: There is no evidence of pericardial effusion. Mitral Valve: The mitral valve is normal in structure. No evidence of mitral valve regurgitation. No evidence of mitral valve stenosis. Tricuspid Valve: The tricuspid valve is normal in structure. Tricuspid valve regurgitation is mild . No evidence of tricuspid stenosis. Aortic Valve: The aortic valve is normal in structure. Aortic valve regurgitation is not visualized. No aortic stenosis is present. Pulmonic Valve: The pulmonic valve was normal in structure. Pulmonic valve regurgitation is not visualized. No evidence of pulmonic stenosis. Aorta: The aortic root is normal in size and structure. Venous: The inferior vena cava is normal in size with greater than 50% respiratory variability, suggesting right atrial pressure of 3 mmHg. IAS/Shunts: No atrial level shunt detected by color flow Doppler.  LEFT VENTRICLE PLAX 2D LVIDd:         4.10 cm  Diastology LVIDs:         2.80 cm  LV e' medial:    7.51 cm/s LV PW:         1.10 cm  LV E/e' medial:  8.0 LV IVS:        1.00 cm  LV e' lateral:   5.44 cm/s LVOT diam:     2.20 cm  LV E/e' lateral: 11.1 LV SV:         68 LV SV Index:   32 LVOT Area:     3.80 cm  RIGHT VENTRICLE RV S prime:     14.40 cm/s TAPSE (M-mode): 1.8 cm LEFT ATRIUM           Index       RIGHT ATRIUM           Index LA diam:      3.80 cm 1.78 cm/m  RA Area:     19.00 cm LA Vol (A2C): 51.1 ml 23.98 ml/m RA Volume:   49.90 ml  23.42 ml/m LA Vol (A4C): 59.9 ml 28.Theresa  ml/m  AORTIC VALVE LVOT Vmax:   88.80 cm/s LVOT Vmean:  66.200 cm/s LVOT VTI:    0.179 m  AORTA Ao Root diam: 3.50 cm Ao Asc diam:  3.20 cm MITRAL VALVE                TRICUSPID VALVE MV Area (PHT): 4.29 cm     TR Peak grad:   28.5 mmHg MV Decel Time: 177 msec     TR Vmax:        267.00 cm/s MV E velocity: 60.40 cm/s MV A  velocity: 100.00 cm/s  SHUNTS MV E/A ratio:  0.60         Systemic VTI:  0.18 m                             Systemic Diam: 2.20 cm Dani Gobble Croitoru MD Electronically signed by Sanda Klein MD Signature Date/Time: 08/09/2020/9:50:22 AM    Final    VAS [place] LOWER EXTREMITY VENOUS (DVT) (ONLY [facility] & [facility])  Result Date: 08/08/2020  Lower Venous DVT Study Indications: Edema.  Comparison Study: Previous exam 03/30/2020 Performing Technologist: Vonzell Schlatter RVT  Examination Guidelines: A complete evaluation includes B-mode imaging, spectral Doppler, color Doppler, and power Doppler as needed of all accessible portions of each vessel. Bilateral testing is considered an integral part of a complete examination. Limited examinations for reoccurring indications may be performed as noted. The reflux portion of the exam is performed with the patient in reverse Trendelenburg.  +---------+---------------+---------+-----------+----------+--------------+ RIGHT    CompressibilityPhasicitySpontaneityPropertiesThrombus Aging +---------+---------------+---------+-----------+----------+--------------+ CFV      Full           Yes      Yes                                 +---------+---------------+---------+-----------+----------+--------------+ SFJ      Full                                                        +---------+---------------+---------+-----------+----------+--------------+ FV Prox  Full                                                        +---------+---------------+---------+-----------+----------+--------------+ FV Mid   Full                                                        +---------+---------------+---------+-----------+----------+--------------+ FV DistalFull                                                        +---------+---------------+---------+-----------+----------+--------------+ PFV      Full                                                         +---------+---------------+---------+-----------+----------+--------------+  POP      Full           Yes      Yes                                 +---------+---------------+---------+-----------+----------+--------------+ PTV      Full                                                        +---------+---------------+---------+-----------+----------+--------------+ PERO     Full                                                        +---------+---------------+---------+-----------+----------+--------------+   +---------+---------------+---------+-----------+----------+--------------+ LEFT     CompressibilityPhasicitySpontaneityPropertiesThrombus Aging +---------+---------------+---------+-----------+----------+--------------+ CFV      Full           Yes      Yes                                 +---------+---------------+---------+-----------+----------+--------------+ SFJ      Full                                                        +---------+---------------+---------+-----------+----------+--------------+ FV Prox  Full                                                        +---------+---------------+---------+-----------+----------+--------------+ FV Mid   Full                                                        +---------+---------------+---------+-----------+----------+--------------+ FV DistalFull                                                        +---------+---------------+---------+-----------+----------+--------------+ PFV      Full                                                        +---------+---------------+---------+-----------+----------+--------------+ POP      Full           Yes      Yes                                 +---------+---------------+---------+-----------+----------+--------------+  PTV      Full                                                         +---------+---------------+---------+-----------+----------+--------------+ PERO     Full                                                        +---------+---------------+---------+-----------+----------+--------------+ SSV      Partial                                      Chronic        +---------+---------------+---------+-----------+----------+--------------+     Summary: RIGHT: - There is no evidence of deep vein thrombosis in the lower extremity.  - No cystic structure found in the popliteal fossa.  LEFT: - Known chronic superficial vein thrombosis involving the left small saphenous vein. - There is no evidence of deep vein thrombosis in the lower extremity.  - No cystic structure found in the popliteal fossa. - Known Chronic SSV found 03/2020  *See table(s) above for measurements and observations. Electronically signed by Ruta Hinds MD on 08/08/2020 at 5:21:53 PM.    Final     Microbiology: Recent Results (from the past 240 hour(s))  SARS CORONAVIRUS 2 (TAT 6-24 HRS) Nasopharyngeal Nasopharyngeal Swab     Status: None   Collection Time: 08/09/20 10:22 PM   Specimen: Nasopharyngeal Swab  Result Value Ref Range Status   SARS Coronavirus 2 NEGATIVE NEGATIVE Final    Comment: (NOTE) SARS-CoV-2 target nucleic acids are NOT DETECTED.  The SARS-CoV-2 RNA is generally detectable in upper and lower respiratory specimens during the acute phase of infection. Negative results do not preclude SARS-CoV-2 infection, do not rule out co-infections with other pathogens, and should not be used as the sole basis for treatment or other patient management decisions. Negative results must be combined with clinical observations, patient history, and epidemiological information. The expected result is Negative.  Fact Sheet for Patients: SugarRoll.be  Fact Sheet for Healthcare Providers: https://www.woods-mathews.com/  This test is not yet approved or  cleared by the [place] FDA and  has been authorized for detection and/or diagnosis of SARS-CoV-2 by FDA under an Emergency Use Authorization (EUA). This EUA will remain  in effect (meaning this test can be used) for the duration of the COVID-19 declaration under Se ction 564(b)(1) of the Act, 21 U.S.C. section 360bbb-3(b)(1), unless the authorization is terminated or revoked sooner.  Performed at [facility], [place] [address] [place], [place] [address]      Labs: Basic Metabolic Panel: Recent Labs  Lab 08/08/20 1431 08/09/20 0417 08/10/20 0355 08/11/20 0237  NA 133* 134* 134* 133*  K 5.1 4.4 4.5 4.4  CL 94* 97* 102 99  CO2 $Re'28 27 25 25  'UZX$ GLUCOSE 103* 100* 102* 97  BUN 59* 45* 28* 22  CREATININE 1.76* 1.31* 1.08 1.16  CALCIUM 9.5 8.8* 8.3* 8.7*   Liver Function Tests: Recent Labs  Lab 08/08/20 1431  AST 19  ALT 20  ALKPHOS 86  BILITOT 0.5  PROT 6.7  ALBUMIN 3.2*   No results for input(s): LIPASE, AMYLASE in the last 168 hours. No results for input(s): AMMONIA in the last 168 hours. CBC: Recent Labs  Lab 08/08/20 1431 08/09/20 0417 08/10/20 0355 08/11/20 0237  WBC 15.1* 11.0* 9.2 7.9  HGB 12.5* 11.4* 10.3* 11.1*  HCT 39.8 34.2* 32.6* 33.9*  MCV 92.1 90.0 92.9 91.4  PLT 344 319 295 306   Cardiac Enzymes: No results for input(s): CKTOTAL, CKMB, CKMBINDEX, TROPONINI in the last 168 hours. BNP: BNP (last 3 results) Recent Labs    08/08/20 2138  BNP 277.9*    ProBNP (last 3 results) Recent Labs    03/29/20 1113 07/29/20 1408  PROBNP 175.0* 222.0*    CBG: No results for input(s): GLUCAP in the last 168 hours.     Signed:  Annita Brod, MD [facility] 08/11/2020, 9:32 AM

## 2020-08-11 NOTE — TOC Progression Note (Signed)
Transition of Care [facility]) - Progression Note    Patient Details  Name: [name] MRN: [id number] Date of Birth: Dec 09, 1931  [facility] CM/SW Contact  Beckie Busing, RN Phone Number: [phone]  08/11/2020, 12:54 PM  Clinical Narrative:    [facility] consulted for [facility] and DME. CM unable to reach patient called wife Pilot Prindle to offer choice for home health and DME needs. Home Health has been set up with [facility] . DME 3 in 1 to be delivered per [facility]. No other needs noted at this time. TOC will sign off.         Expected Discharge Plan and Services           Expected Discharge Date: 08/11/20                                     Social Determinants of Health (SDOH) Interventions    Readmission Risk Interventions No flowsheet data found.

## 2020-08-11 NOTE — Progress Notes (Signed)
Patient upset about not getting pain enough medicine, RN explained that he is now getting his home dose an that it is scheduled to help maintain pain. Patient is used to taking meds at a different time. Also states he is ready to go home because his "legs are down now".

## 2020-08-11 NOTE — Progress Notes (Signed)
Physical Therapy Treatment Patient Details Name: [name] MRN: [id number] DOB: [birth year] Today's Date: 08/11/2020    History of Present Illness 86yo male c/o BLE pain and skin changes, pain. DVT negative. Admitted with cellulitis, acute renal failure. PMH CAD, chronic neck and back pain, hx DVT, gout, HLD, HTN, TKR, back surgery, T12/L1/L2  kyphoplasty 06/10/20, posterior cervical surgery    PT Comments    Patient received in bed, he agrees to PT session. ( initially refused reported he needed oxycontin prior to session.) Patient requires min assist with supine >< sit. Min guard with sit to stand from low bed. He ambulated 200 feet with RW and min guard. Patient requesting pain medicine again at end of session. RN notified. Patient will continue to benefit from skilled PT while here to improve functional mobility and safety.       Follow Up Recommendations  Home health PT     Equipment Recommendations  3in1 (PT)    Recommendations for Other Services       Precautions / Restrictions Precautions Precautions: Fall Precaution Comments: kyphoplasty 06/10/20 Restrictions Weight Bearing Restrictions: No    Mobility  Bed Mobility Overal bed mobility: Needs Assistance Bed Mobility: Supine to Sit;Sit to Supine Rolling: Min assist   Supine to sit: Min assist Sit to supine: Min assist   General bed mobility comments: patient reports "I need a hand" when trying to sit up. Then required min assist to bring LEs back up onto bed.  Transfers   Equipment used: Rolling walker (2 wheeled)   Sit to Stand: Min guard            Ambulation/Gait   Gait Distance (Feet): 200 Feet Assistive device: Rolling walker (2 wheeled) Gait Pattern/deviations: Step-through pattern;Decreased step length - right;Decreased step length - left;Decreased stride length;Trunk flexed Gait velocity: decreased   General Gait Details: slow but steady with RW   Stairs             Wheelchair  Mobility    Modified Rankin (Stroke Patients Only)       Balance Overall balance assessment: Needs assistance Sitting-balance support: Feet supported Sitting balance-Leahy Scale: Good     Standing balance support: Bilateral upper extremity supported;During functional activity Standing balance-Leahy Scale: Good Standing balance comment: reliant on BUE support                            Cognition Arousal/Alertness: Awake/alert Behavior During Therapy: WFL for tasks assessed/performed Overall Cognitive Status: Within Functional Limits for tasks assessed                                        Exercises      General Comments General comments (skin integrity, edema, etc.): B LEs red especially after walking. Patient appears to have no pain during session, but then requesting pain medicine at end of session. (Just received about an hour prior to session)      Pertinent Vitals/Pain Pain Assessment: No/denies pain    Home Living                      Prior Function            PT Goals (current goals can now be found in the care plan section) Acute Rehab PT Goals Patient Stated Goal: patient wants to go home PT  Goal Formulation: With patient Time For Goal Achievement: 08/24/20 Potential to Achieve Goals: Fair Progress towards PT goals: Progressing toward goals    Frequency    Min 3X/week      PT Plan Discharge plan needs to be updated    Co-evaluation              AM-PAC PT "6 Clicks" Mobility   Outcome Measure  Help needed turning from your back to your side while in a flat bed without using bedrails?: None Help needed moving from lying on your back to sitting on the side of a flat bed without using bedrails?: A Little Help needed moving to and from a bed to a chair (including a wheelchair)?: A Little Help needed standing up from a chair using your arms (e.g., wheelchair or bedside chair)?: A Little Help needed to  walk in hospital room?: A Little Help needed climbing 3-5 steps with a railing? : A Little 6 Click Score: 19    End of Session Equipment Utilized During Treatment: Gait belt Activity Tolerance: Patient tolerated treatment well Patient left: in bed;with call bell/phone within reach;with bed alarm set Nurse Communication: Mobility status;Patient requests pain meds PT Visit Diagnosis: Muscle weakness (generalized) (M62.81)     Time: 1040-1057 PT Time Calculation (min) (ACUTE ONLY): 17 min  Charges:  $Gait Training: 8-22 mins                     Andree Heeg, PT, GCS 08/11/20,11:14 AM

## 2020-08-11 NOTE — Progress Notes (Signed)
Patient repositioned multiple times, scheduled oxy given, and a one time dose of dilaudid given for breakthrough pain. Patient still complains of pain and states he needs to go home. Kpad ordered to see if it will help. Will continue to monitor.

## 2020-08-11 NOTE — Progress Notes (Signed)
D/C instructions given and reviewed. No questions asked but encouraged to call with any concerns. Tele and IV removed, tolerated well. 

## 2020-08-13 ENCOUNTER — Other Ambulatory Visit: Payer: Self-pay | Admitting: Internal Medicine

## 2020-08-16 ENCOUNTER — Telehealth: Payer: Self-pay | Admitting: Internal Medicine

## 2020-08-16 NOTE — Telephone Encounter (Signed)
Fine for verbals °

## 2020-08-16 NOTE — Telephone Encounter (Signed)
Darnelle a physical therapist with [facility] calling to request verbal orders for physical therapy for 2 times a week for 3 weeks. Also requesting a occupational therapy consult to assist with ADL. Darnelle- [phone] Okay to LVM

## 2020-08-16 NOTE — Telephone Encounter (Signed)
Verbal orders given for below.  

## 2020-08-19 ENCOUNTER — Inpatient Hospital Stay: Payer: Medicare Other | Admitting: Internal Medicine

## 2020-08-21 ENCOUNTER — Other Ambulatory Visit: Payer: Self-pay | Admitting: Internal Medicine

## 2020-08-22 ENCOUNTER — Encounter: Payer: Self-pay | Admitting: Internal Medicine

## 2020-08-22 ENCOUNTER — Ambulatory Visit (INDEPENDENT_AMBULATORY_CARE_PROVIDER_SITE_OTHER): Payer: Medicare Other | Admitting: Internal Medicine

## 2020-08-22 ENCOUNTER — Other Ambulatory Visit: Payer: Self-pay

## 2020-08-22 VITALS — BP 142/60 | HR 95 | Temp 98.3°F | Wt 194.0 lb

## 2020-08-22 DIAGNOSIS — M5416 Radiculopathy, lumbar region: Secondary | ICD-10-CM

## 2020-08-22 DIAGNOSIS — L03115 Cellulitis of right lower limb: Secondary | ICD-10-CM

## 2020-08-22 DIAGNOSIS — G8929 Other chronic pain: Secondary | ICD-10-CM | POA: Diagnosis not present

## 2020-08-22 MED ORDER — KETOROLAC TROMETHAMINE 30 MG/ML IJ SOLN
30.0000 mg | Freq: Once | INTRAMUSCULAR | Status: AC
  Administered 2020-08-22: 30 mg via INTRAVENOUS

## 2020-08-22 MED ORDER — DULOXETINE HCL 30 MG PO CPEP: 30.0000 mg | ORAL_CAPSULE | Freq: Every day | ORAL | 3 refills | Status: DC

## 2020-08-22 MED ORDER — CELECOXIB 100 MG PO CAPS
100.0000 mg | ORAL_CAPSULE | Freq: Two times a day (BID) | ORAL | 3 refills | Status: DC
Start: 2020-08-22 — End: 2020-11-29

## 2020-08-22 NOTE — Patient Instructions (Signed)
We have sent in celebrex to take 1 pill twice a day for pain.  We will also start cymbalta 1 pill daily to help with the back pain (this can take 1-2 weeks to kick in).  We will have you take 3 pills of torsemide daily for the next 2-3 days to see if we can get the fluid down.

## 2020-08-22 NOTE — Progress Notes (Signed)
Subjective:   Patient ID: [name], male    DOB: Jun 22, 1932, 87 y.o.   MRN: [id number]  HPI The patient is an 87 YO man coming in for hospital follow up (in for cellulitis and sepsis, given antibiotics and some diuretics, severe pain in his back). He is still having the severe pain in the back and this is his main focus. This has been worse since the fall when he had compression fractures in the lumbar spine. He did have kyphoplasty and this did not help his pain. He has not returned to that doctor since that time. He has gotten some temporary relief with toradol injections in the office but these last for a short time. Denies current fevers or chills. Feels weakness in his legs. Swelling is still present and he did take 3 torsemide one night and this made him urinate. He has not taken today due to coming here and had not been taking consistently since leaving hospital. He is in miserable pain and taking oxycodone from pain management but this is not helping well. Tried lyrica prior to hospital and this caused severe mental status change and lethargy and cannot take that. He does not recall that it helped with the pain either. Denies new falls but using walker and being careful. He is severely limited in his daily activities due to the pain. Has tried lidocaine patches in the past without relief. Has tried many other medications as well.   PMH, [facility], social history reviewed and updated  Review of Systems  Constitutional: Positive for activity change, appetite change and fatigue.  HENT: Negative.   Eyes: Negative.   Respiratory: Negative for cough, chest tightness and shortness of breath.   Cardiovascular: Positive for leg swelling. Negative for chest pain and palpitations.  Gastrointestinal: Negative for abdominal distention, abdominal pain, constipation, diarrhea, nausea and vomiting.  Musculoskeletal: Positive for arthralgias, back pain and gait problem.  Skin: Negative.    Psychiatric/Behavioral: Negative.     Objective:  Physical Exam Constitutional:      Appearance: He is well-developed and well-nourished.     Comments: Chronically ill appearing  HENT:     Head: Normocephalic and atraumatic.  Eyes:     Extraocular Movements: EOM normal.  Cardiovascular:     Rate and Rhythm: Normal rate and regular rhythm.  Pulmonary:     Effort: Pulmonary effort is normal. No respiratory distress.     Breath sounds: Normal breath sounds. No wheezing or rales.  Abdominal:     General: Bowel sounds are normal. There is no distension.     Palpations: Abdomen is soft.     Tenderness: There is no abdominal tenderness. There is no rebound.  Musculoskeletal:        General: Tenderness present. No edema.     Cervical back: Normal range of motion.  Skin:    General: Skin is warm and dry.  Neurological:     Mental Status: He is alert and oriented to person, place, and time. Mental status is at baseline.     Coordination: Coordination abnormal.  Psychiatric:        Mood and Affect: Mood and affect normal.     Vitals:   08/22/20 1409  BP: (!) 142/60  Pulse: 95  Temp: 98.3 F (36.8 C)  TempSrc: Oral  SpO2: 95%  Weight: 194 lb (88 kg)    This visit occurred during the SARS-CoV-2 public health emergency.  Safety protocols were in place, including screening questions prior  to the visit, additional usage of staff PPE, and extensive cleaning of exam room while observing appropriate contact time as indicated for disinfecting solutions.   Assessment & Plan:  Toradol 30 mg IM given at visit  Visit time 35 minutes in face to face communication with patient and coordination of care, additional 15 minutes spent in record review, coordination or care, ordering tests, communicating/referring to other healthcare professionals, documenting in medical records all on the same day of the visit for total time 50 minutes spent on the visit.

## 2020-08-23 DIAGNOSIS — I130 Hypertensive heart and chronic kidney disease with heart failure and stage 1 through stage 4 chronic kidney disease, or unspecified chronic kidney disease: Secondary | ICD-10-CM | POA: Diagnosis not present

## 2020-08-23 DIAGNOSIS — Z87891 Personal history of nicotine dependence: Secondary | ICD-10-CM

## 2020-08-23 DIAGNOSIS — Z7952 Long term (current) use of systemic steroids: Secondary | ICD-10-CM

## 2020-08-23 DIAGNOSIS — E785 Hyperlipidemia, unspecified: Secondary | ICD-10-CM

## 2020-08-23 DIAGNOSIS — L03115 Cellulitis of right lower limb: Secondary | ICD-10-CM

## 2020-08-23 DIAGNOSIS — G8929 Other chronic pain: Secondary | ICD-10-CM

## 2020-08-23 DIAGNOSIS — M1990 Unspecified osteoarthritis, unspecified site: Secondary | ICD-10-CM

## 2020-08-23 DIAGNOSIS — N138 Other obstructive and reflux uropathy: Secondary | ICD-10-CM

## 2020-08-23 DIAGNOSIS — N179 Acute kidney failure, unspecified: Secondary | ICD-10-CM

## 2020-08-23 DIAGNOSIS — I82812 Embolism and thrombosis of superficial veins of left lower extremities: Secondary | ICD-10-CM

## 2020-08-23 DIAGNOSIS — I2510 Atherosclerotic heart disease of native coronary artery without angina pectoris: Secondary | ICD-10-CM

## 2020-08-23 DIAGNOSIS — N182 Chronic kidney disease, stage 2 (mild): Secondary | ICD-10-CM | POA: Diagnosis not present

## 2020-08-23 DIAGNOSIS — Z85828 Personal history of other malignant neoplasm of skin: Secondary | ICD-10-CM

## 2020-08-23 DIAGNOSIS — M549 Dorsalgia, unspecified: Secondary | ICD-10-CM

## 2020-08-23 DIAGNOSIS — M542 Cervicalgia: Secondary | ICD-10-CM

## 2020-08-23 DIAGNOSIS — Z9181 History of falling: Secondary | ICD-10-CM

## 2020-08-23 DIAGNOSIS — N401 Enlarged prostate with lower urinary tract symptoms: Secondary | ICD-10-CM

## 2020-08-23 DIAGNOSIS — Z79899 Other long term (current) drug therapy: Secondary | ICD-10-CM

## 2020-08-23 DIAGNOSIS — Z85820 Personal history of malignant melanoma of skin: Secondary | ICD-10-CM

## 2020-08-23 DIAGNOSIS — Z79891 Long term (current) use of opiate analgesic: Secondary | ICD-10-CM

## 2020-08-23 DIAGNOSIS — I509 Heart failure, unspecified: Secondary | ICD-10-CM

## 2020-08-23 DIAGNOSIS — Z981 Arthrodesis status: Secondary | ICD-10-CM

## 2020-08-23 DIAGNOSIS — M109 Gout, unspecified: Secondary | ICD-10-CM

## 2020-08-24 ENCOUNTER — Telehealth: Payer: Self-pay | Admitting: Internal Medicine

## 2020-08-24 NOTE — Telephone Encounter (Signed)
Fine for verbals °

## 2020-08-24 NOTE — Telephone Encounter (Signed)
Verbal orders given for below.  

## 2020-08-24 NOTE — Telephone Encounter (Signed)
° ° °  Katy from [facility] requesting orders for nursing 2W2 1W6 For nursing and wound care  Patient has a wound left lower leg Change dressing 3x week, skin prep,Medihoney and foam dressing  Phone [phone]

## 2020-08-25 ENCOUNTER — Other Ambulatory Visit: Payer: Self-pay | Admitting: Internal Medicine

## 2020-08-25 NOTE — Assessment & Plan Note (Signed)
Still with swelling with cellulitis appears to be resolving. No further antibiotics prescribed today.

## 2020-08-25 NOTE — Assessment & Plan Note (Signed)
Toradol 30 mg IM given at visit. Discussed that options are limited for his medical treatment of the back pain. Advised to contact and schedule with his neurosurgeon to see what options are available from them. Tried and unable to take gabapentin and lyrica. Rx done today for celebrex to see if this helps. Given toradol injection in the office 30 mg IM today which has helped in the past. Will rx cymbalta 30 mg today to see if we can get some relief from pain. He is already taking oxycodone from pain management which is not providing sufficient relief. Using walker for ambulation.

## 2020-08-26 NOTE — Telephone Encounter (Signed)
I called pt- he states he actually stopped taking prednisone qd due to risks with increased bone loss. He states he does not need a refill and only takes it prn.

## 2020-08-26 NOTE — Telephone Encounter (Signed)
Can we call and clarify dosing? He should only be taking 1 pill daily and rarely more if flare so he should not be out yet.

## 2020-09-05 ENCOUNTER — Other Ambulatory Visit: Payer: Self-pay | Admitting: Neurosurgery

## 2020-09-06 ENCOUNTER — Encounter (HOSPITAL_COMMUNITY): Payer: Self-pay | Admitting: Neurosurgery

## 2020-09-06 ENCOUNTER — Other Ambulatory Visit (HOSPITAL_COMMUNITY)
Admission: RE | Admit: 2020-09-06 | Discharge: 2020-09-06 | Disposition: A | Discharge status: Home / Self Care | Payer: 59 | Source: Ambulatory Visit | Attending: Neurosurgery | Admitting: Neurosurgery

## 2020-09-06 ENCOUNTER — Other Ambulatory Visit: Payer: Self-pay

## 2020-09-06 DIAGNOSIS — Z01812 Encounter for preprocedural laboratory examination: Secondary | ICD-10-CM | POA: Diagnosis present

## 2020-09-06 DIAGNOSIS — Z20822 Contact with and (suspected) exposure to covid-19: Secondary | ICD-10-CM | POA: Diagnosis not present

## 2020-09-06 NOTE — Progress Notes (Addendum)
[name] denies chest pain or shortness of breath.  Patient denies s/s of Covid in himself, wife or anyone they have been in contact with. [name] will be tested for Covid today. I instructed [name] to not take Celebrex today or in am.

## 2020-09-07 ENCOUNTER — Ambulatory Visit (HOSPITAL_COMMUNITY): Payer: Medicare Other

## 2020-09-07 ENCOUNTER — Observation Stay (HOSPITAL_COMMUNITY)
Admission: RE | Admit: 2020-09-07 | Discharge: 2020-09-09 | Disposition: A | Discharge status: Home / Self Care | Payer: Medicare Other | Attending: Neurosurgery | Admitting: Neurosurgery

## 2020-09-07 ENCOUNTER — Encounter (HOSPITAL_COMMUNITY)
Admission: RE | Disposition: A | Discharge status: Home / Self Care | Payer: Self-pay | Source: Home / Self Care | Attending: Neurosurgery

## 2020-09-07 ENCOUNTER — Other Ambulatory Visit: Payer: Self-pay

## 2020-09-07 ENCOUNTER — Encounter (HOSPITAL_COMMUNITY): Payer: Self-pay | Admitting: Neurosurgery

## 2020-09-07 DIAGNOSIS — X58XXXA Exposure to other specified factors, initial encounter: Secondary | ICD-10-CM | POA: Diagnosis not present

## 2020-09-07 DIAGNOSIS — Z85828 Personal history of other malignant neoplasm of skin: Secondary | ICD-10-CM | POA: Diagnosis not present

## 2020-09-07 DIAGNOSIS — S32030A Wedge compression fracture of third lumbar vertebra, initial encounter for closed fracture: Principal | ICD-10-CM | POA: Insufficient documentation

## 2020-09-07 DIAGNOSIS — Z87891 Personal history of nicotine dependence: Secondary | ICD-10-CM | POA: Insufficient documentation

## 2020-09-07 DIAGNOSIS — R1115 Cyclical vomiting syndrome unrelated to migraine: Secondary | ICD-10-CM

## 2020-09-07 DIAGNOSIS — S32000A Wedge compression fracture of unspecified lumbar vertebra, initial encounter for closed fracture: Secondary | ICD-10-CM | POA: Diagnosis present

## 2020-09-07 DIAGNOSIS — I110 Hypertensive heart disease with heart failure: Secondary | ICD-10-CM | POA: Insufficient documentation

## 2020-09-07 DIAGNOSIS — I2510 Atherosclerotic heart disease of native coronary artery without angina pectoris: Secondary | ICD-10-CM | POA: Diagnosis not present

## 2020-09-07 DIAGNOSIS — I509 Heart failure, unspecified: Secondary | ICD-10-CM | POA: Insufficient documentation

## 2020-09-07 DIAGNOSIS — Z96653 Presence of artificial knee joint, bilateral: Secondary | ICD-10-CM | POA: Insufficient documentation

## 2020-09-07 DIAGNOSIS — Z79899 Other long term (current) drug therapy: Secondary | ICD-10-CM | POA: Diagnosis not present

## 2020-09-07 DIAGNOSIS — Z419 Encounter for procedure for purposes other than remedying health state, unspecified: Secondary | ICD-10-CM

## 2020-09-07 HISTORY — PX: KYPHOPLASTY: SHX5884

## 2020-09-07 HISTORY — DX: Heart failure, unspecified: I50.9

## 2020-09-07 LAB — CBC
HCT: 38.3 % — ABNORMAL LOW (ref 39.0–52.0)
Hemoglobin: 12.3 g/dL — ABNORMAL LOW (ref 13.0–17.0)
MCH: 29 pg (ref 26.0–34.0)
MCHC: 32.1 g/dL (ref 30.0–36.0)
MCV: 90.3 fL (ref 80.0–100.0)
Platelets: 484 10*3/uL — ABNORMAL HIGH (ref 150–400)
RBC: 4.24 MIL/uL (ref 4.22–5.81)
RDW: 14 % (ref 11.5–15.5)
WBC: 9.3 10*3/uL (ref 4.0–10.5)
nRBC: 0 % (ref 0.0–0.2)

## 2020-09-07 LAB — BASIC METABOLIC PANEL
Anion gap: 15 (ref 5–15)
BUN: 34 mg/dL — ABNORMAL HIGH (ref 8–23)
CO2: 22 mmol/L (ref 22–32)
Calcium: 9.4 mg/dL (ref 8.9–10.3)
Chloride: 97 mmol/L — ABNORMAL LOW (ref 98–111)
Creatinine, Ser: 1.92 mg/dL — ABNORMAL HIGH (ref 0.61–1.24)
GFR, Estimated: 33 mL/min — ABNORMAL LOW (ref >60)
Glucose, Bld: 104 mg/dL — ABNORMAL HIGH (ref 70–99)
Potassium: 3.8 mmol/L (ref 3.5–5.1)
Sodium: 134 mmol/L — ABNORMAL LOW (ref 135–145)

## 2020-09-07 LAB — SARS CORONAVIRUS 2 (TAT 6-24 HRS): SARS Coronavirus 2: NEGATIVE

## 2020-09-07 LAB — SURGICAL PCR SCREEN
MRSA, PCR: NEGATIVE
Staphylococcus aureus: POSITIVE — AB

## 2020-09-07 SURGERY — KYPHOPLASTY
Anesthesia: General | Site: Back

## 2020-09-07 MED ORDER — ONDANSETRON HCL 4 MG/2ML IJ SOLN
INTRAMUSCULAR | Status: AC
  Filled 2020-09-07: qty 2

## 2020-09-07 MED ORDER — VASOPRESSIN 20 UNIT/ML IV SOLN
INTRAVENOUS | Status: DC | PRN
  Administered 2020-09-07 (×3): 1 [IU] via INTRAVENOUS

## 2020-09-07 MED ORDER — 0.9 % SODIUM CHLORIDE (POUR BTL) OPTIME
TOPICAL | Status: DC | PRN
  Administered 2020-09-07: 1000 mL

## 2020-09-07 MED ORDER — AMISULPRIDE (ANTIEMETIC) 5 MG/2ML IV SOLN
INTRAVENOUS | Status: AC
  Filled 2020-09-07: qty 4

## 2020-09-07 MED ORDER — ACETAMINOPHEN 500 MG PO TABS
1000.0000 mg | ORAL_TABLET | Freq: Once | ORAL | Status: AC
  Administered 2020-09-07: 1000 mg via ORAL
  Filled 2020-09-07: qty 2

## 2020-09-07 MED ORDER — ROCURONIUM BROMIDE 10 MG/ML (PF) SYRINGE
PREFILLED_SYRINGE | INTRAVENOUS | Status: AC
  Filled 2020-09-07: qty 10

## 2020-09-07 MED ORDER — DIPHENHYDRAMINE HCL 50 MG/ML IJ SOLN
INTRAMUSCULAR | Status: DC | PRN
  Administered 2020-09-07 (×2): 12.5 mg via INTRAVENOUS

## 2020-09-07 MED ORDER — CEFAZOLIN SODIUM-DEXTROSE 2-4 GM/100ML-% IV SOLN
2.0000 g | INTRAVENOUS | Status: AC
  Administered 2020-09-07: 2 g via INTRAVENOUS

## 2020-09-07 MED ORDER — ALBUMIN HUMAN 5 % IV SOLN: INTRAVENOUS | Status: DC | PRN

## 2020-09-07 MED ORDER — CHLORHEXIDINE GLUCONATE CLOTH 2 % EX PADS: 6.0000 | MEDICATED_PAD | Freq: Once | CUTANEOUS | Status: DC

## 2020-09-07 MED ORDER — ALBUMIN HUMAN 5 % IV SOLN
INTRAVENOUS | Status: AC
  Filled 2020-09-07: qty 250

## 2020-09-07 MED ORDER — ONDANSETRON 4 MG PO TBDP
8.0000 mg | ORAL_TABLET | Freq: Three times a day (TID) | ORAL | Status: DC | PRN
  Administered 2020-09-07: 8 mg via ORAL
  Filled 2020-09-07: qty 2

## 2020-09-07 MED ORDER — LACTATED RINGERS IV SOLN: INTRAVENOUS | Status: DC

## 2020-09-07 MED ORDER — LIDOCAINE-EPINEPHRINE 0.5 %-1:200000 IJ SOLN
INTRAMUSCULAR | Status: DC | PRN
  Administered 2020-09-07: 16 mL

## 2020-09-07 MED ORDER — LIDOCAINE 2% (20 MG/ML) 5 ML SYRINGE
INTRAMUSCULAR | Status: AC
  Filled 2020-09-07: qty 5

## 2020-09-07 MED ORDER — ONDANSETRON HCL 4 MG/2ML IJ SOLN
INTRAMUSCULAR | Status: DC | PRN
  Administered 2020-09-07: 4 mg via INTRAVENOUS

## 2020-09-07 MED ORDER — PHENYLEPHRINE 40 MCG/ML (10ML) SYRINGE FOR IV PUSH (FOR BLOOD PRESSURE SUPPORT)
PREFILLED_SYRINGE | INTRAVENOUS | Status: DC | PRN
  Administered 2020-09-07: 40 ug via INTRAVENOUS
  Administered 2020-09-07: 80 ug via INTRAVENOUS
  Administered 2020-09-07: 120 ug via INTRAVENOUS
  Administered 2020-09-07: 40 ug via INTRAVENOUS
  Administered 2020-09-07: 120 ug via INTRAVENOUS
  Administered 2020-09-07: 160 ug via INTRAVENOUS

## 2020-09-07 MED ORDER — CHLORHEXIDINE GLUCONATE 0.12 % MT SOLN: 15.0000 mL | Freq: Once | OROMUCOSAL | Status: AC

## 2020-09-07 MED ORDER — CHLORHEXIDINE GLUCONATE 0.12 % MT SOLN
OROMUCOSAL | Status: AC
  Administered 2020-09-07: 15 mL via OROMUCOSAL
  Filled 2020-09-07: qty 15

## 2020-09-07 MED ORDER — EPHEDRINE SULFATE-NACL 50-0.9 MG/10ML-% IV SOSY
PREFILLED_SYRINGE | INTRAVENOUS | Status: DC | PRN
  Administered 2020-09-07: 15 mg via INTRAVENOUS
  Administered 2020-09-07: 5 mg via INTRAVENOUS
  Administered 2020-09-07 (×2): 10 mg via INTRAVENOUS

## 2020-09-07 MED ORDER — CARVEDILOL 25 MG PO TABS
25.0000 mg | ORAL_TABLET | Freq: Two times a day (BID) | ORAL | Status: DC
  Administered 2020-09-08 – 2020-09-09 (×3): 25 mg via ORAL
  Filled 2020-09-07 (×2): qty 1

## 2020-09-07 MED ORDER — FENTANYL CITRATE (PF) 250 MCG/5ML IJ SOLN
INTRAMUSCULAR | Status: AC
  Filled 2020-09-07: qty 5

## 2020-09-07 MED ORDER — PROPOFOL 10 MG/ML IV BOLUS
INTRAVENOUS | Status: AC
  Filled 2020-09-07: qty 20

## 2020-09-07 MED ORDER — DEXAMETHASONE SODIUM PHOSPHATE 10 MG/ML IJ SOLN
INTRAMUSCULAR | Status: AC
  Filled 2020-09-07: qty 1

## 2020-09-07 MED ORDER — FLUTICASONE PROPIONATE 50 MCG/ACT NA SUSP
1.0000 | Freq: Every day | NASAL | Status: DC | PRN
  Filled 2020-09-07: qty 16

## 2020-09-07 MED ORDER — ALBUMIN HUMAN 5 % IV SOLN
12.5000 g | Freq: Once | INTRAVENOUS | Status: AC
  Administered 2020-09-07: 12.5 g via INTRAVENOUS

## 2020-09-07 MED ORDER — CELECOXIB 100 MG PO CAPS
100.0000 mg | ORAL_CAPSULE | Freq: Two times a day (BID) | ORAL | Status: DC
  Administered 2020-09-07 – 2020-09-09 (×4): 100 mg via ORAL
  Filled 2020-09-07 (×5): qty 1

## 2020-09-07 MED ORDER — TORSEMIDE 20 MG PO TABS
40.0000 mg | ORAL_TABLET | Freq: Every day | ORAL | Status: DC | PRN
Start: 2020-09-07 — End: 2020-09-09
  Filled 2020-09-07: qty 2

## 2020-09-07 MED ORDER — DULOXETINE HCL 30 MG PO CPEP
30.0000 mg | ORAL_CAPSULE | Freq: Every day | ORAL | Status: DC
  Administered 2020-09-08 – 2020-09-09 (×2): 30 mg via ORAL
  Filled 2020-09-07 (×2): qty 1

## 2020-09-07 MED ORDER — LIDOCAINE-EPINEPHRINE 0.5 %-1:200000 IJ SOLN
INTRAMUSCULAR | Status: AC
  Filled 2020-09-07: qty 1

## 2020-09-07 MED ORDER — SUGAMMADEX SODIUM 200 MG/2ML IV SOLN
INTRAVENOUS | Status: DC | PRN
  Administered 2020-09-07: 200 mg via INTRAVENOUS

## 2020-09-07 MED ORDER — VASOPRESSIN 20 UNIT/ML IV SOLN
INTRAVENOUS | Status: AC
  Filled 2020-09-07: qty 1

## 2020-09-07 MED ORDER — CEFAZOLIN SODIUM-DEXTROSE 2-4 GM/100ML-% IV SOLN
INTRAVENOUS | Status: AC
  Filled 2020-09-07: qty 100

## 2020-09-07 MED ORDER — KETOROLAC TROMETHAMINE 15 MG/ML IJ SOLN
INTRAMUSCULAR | Status: AC
  Filled 2020-09-07: qty 1

## 2020-09-07 MED ORDER — SPIRONOLACTONE 25 MG PO TABS
50.0000 mg | ORAL_TABLET | Freq: Every day | ORAL | Status: DC
  Administered 2020-09-08 – 2020-09-09 (×2): 50 mg via ORAL
  Filled 2020-09-07 (×2): qty 2

## 2020-09-07 MED ORDER — AMISULPRIDE (ANTIEMETIC) 5 MG/2ML IV SOLN: 10.0000 mg | Freq: Once | INTRAVENOUS | Status: DC | PRN

## 2020-09-07 MED ORDER — LIDOCAINE 2% (20 MG/ML) 5 ML SYRINGE
INTRAMUSCULAR | Status: DC | PRN
  Administered 2020-09-07: 40 mg via INTRAVENOUS

## 2020-09-07 MED ORDER — DEXAMETHASONE SODIUM PHOSPHATE 10 MG/ML IJ SOLN
INTRAMUSCULAR | Status: DC | PRN
  Administered 2020-09-07: 5 mg via INTRAVENOUS
  Administered 2020-09-07: 4 mg via INTRAVENOUS

## 2020-09-07 MED ORDER — EPINEPHRINE 1 MG/10ML IJ SOSY
PREFILLED_SYRINGE | INTRAMUSCULAR | Status: DC | PRN
  Administered 2020-09-07: .1 mg via INTRAVENOUS
  Administered 2020-09-07: .05 mg via INTRAVENOUS

## 2020-09-07 MED ORDER — CELECOXIB 200 MG PO CAPS
200.0000 mg | ORAL_CAPSULE | Freq: Once | ORAL | Status: DC
  Filled 2020-09-07: qty 1

## 2020-09-07 MED ORDER — MAGNESIUM CITRATE PO SOLN: 0.5000 | Freq: Every day | ORAL | Status: DC | PRN

## 2020-09-07 MED ORDER — FENTANYL CITRATE (PF) 100 MCG/2ML IJ SOLN
INTRAMUSCULAR | Status: AC
  Filled 2020-09-07: qty 2

## 2020-09-07 MED ORDER — TAMSULOSIN HCL 0.4 MG PO CAPS
0.4000 mg | ORAL_CAPSULE | Freq: Every day | ORAL | Status: DC
  Administered 2020-09-07 – 2020-09-08 (×2): 0.4 mg via ORAL
  Filled 2020-09-07 (×2): qty 1

## 2020-09-07 MED ORDER — KETOROLAC TROMETHAMINE 15 MG/ML IJ SOLN
15.0000 mg | Freq: Once | INTRAMUSCULAR | Status: AC
  Administered 2020-09-07: 15 mg via INTRAVENOUS

## 2020-09-07 MED ORDER — OXYCODONE HCL 5 MG PO TABS
5.0000 mg | ORAL_TABLET | ORAL | Status: DC | PRN
  Administered 2020-09-08 – 2020-09-09 (×3): 5 mg via ORAL
  Filled 2020-09-07 (×3): qty 1

## 2020-09-07 MED ORDER — EPHEDRINE 5 MG/ML INJ
INTRAVENOUS | Status: AC
  Filled 2020-09-07: qty 10

## 2020-09-07 MED ORDER — AMLODIPINE BESYLATE 5 MG PO TABS
10.0000 mg | ORAL_TABLET | Freq: Every day | ORAL | Status: DC
  Administered 2020-09-09: 10 mg via ORAL
  Filled 2020-09-07: qty 2

## 2020-09-07 MED ORDER — FENTANYL CITRATE (PF) 100 MCG/2ML IJ SOLN
INTRAMUSCULAR | Status: DC | PRN
  Administered 2020-09-07: 50 ug via INTRAVENOUS

## 2020-09-07 MED ORDER — POTASSIUM CHLORIDE IN NACL 20-0.9 MEQ/L-% IV SOLN: INTRAVENOUS | Status: DC

## 2020-09-07 MED ORDER — ORAL CARE MOUTH RINSE: 15.0000 mL | Freq: Once | OROMUCOSAL | Status: AC

## 2020-09-07 MED ORDER — PROPOFOL 10 MG/ML IV BOLUS
INTRAVENOUS | Status: DC | PRN
  Administered 2020-09-07: 20 mg via INTRAVENOUS
  Administered 2020-09-07: 100 mg via INTRAVENOUS

## 2020-09-07 MED ORDER — AMISULPRIDE (ANTIEMETIC) 5 MG/2ML IV SOLN
5.0000 mg | Freq: Once | INTRAVENOUS | Status: AC
  Administered 2020-09-07: 5 mg via INTRAVENOUS

## 2020-09-07 MED ORDER — HYDRALAZINE HCL 10 MG PO TABS
50.0000 mg | ORAL_TABLET | Freq: Two times a day (BID) | ORAL | Status: DC
  Administered 2020-09-07: 50 mg via ORAL
  Filled 2020-09-07 (×3): qty 5

## 2020-09-07 MED ORDER — IOHEXOL 300 MG/ML  SOLN
INTRAMUSCULAR | Status: DC | PRN
  Administered 2020-09-07: 50 mL

## 2020-09-07 MED ORDER — FENTANYL CITRATE (PF) 100 MCG/2ML IJ SOLN
25.0000 ug | INTRAMUSCULAR | Status: DC | PRN
  Administered 2020-09-07 (×3): 25 ug via INTRAVENOUS

## 2020-09-07 MED ORDER — OXYCODONE HCL ER 15 MG PO T12A
15.0000 mg | EXTENDED_RELEASE_TABLET | Freq: Two times a day (BID) | ORAL | Status: DC
Start: 2020-09-07 — End: 2020-09-09
  Administered 2020-09-07 – 2020-09-09 (×4): 15 mg via ORAL
  Filled 2020-09-07 (×4): qty 1

## 2020-09-07 MED ORDER — ROCURONIUM BROMIDE 10 MG/ML (PF) SYRINGE
PREFILLED_SYRINGE | INTRAVENOUS | Status: DC | PRN
  Administered 2020-09-07: 50 mg via INTRAVENOUS

## 2020-09-07 MED ORDER — TORSEMIDE 20 MG PO TABS
40.0000 mg | ORAL_TABLET | Freq: Every day | ORAL | Status: DC | PRN
  Filled 2020-09-07: qty 5

## 2020-09-07 SURGICAL SUPPLY — 38 items
BLADE CLIPPER SURG (BLADE) IMPLANT
BLADE SURG 15 STRL LF DISP TIS (BLADE) ×1 IMPLANT
BLADE SURG 15 STRL SS (BLADE) ×2
CARTRIDGE OIL MAESTRO DRILL (MISCELLANEOUS) IMPLANT
CEMENT KYPHON C01A KIT/MIXER (Cement) ×2 IMPLANT
COVER WAND RF STERILE (DRAPES) IMPLANT
DERMABOND ADVANCED (GAUZE/BANDAGES/DRESSINGS) ×1
DERMABOND ADVANCED .7 DNX12 (GAUZE/BANDAGES/DRESSINGS) ×1 IMPLANT
DIFFUSER DRILL AIR PNEUMATIC (MISCELLANEOUS) IMPLANT
DRAPE C-ARM 42X72 X-RAY (DRAPES) ×2 IMPLANT
DRAPE HALF SHEET 40X57 (DRAPES) ×2 IMPLANT
DRAPE LAPAROTOMY 100X72X124 (DRAPES) ×2 IMPLANT
DRAPE SURG 17X23 STRL (DRAPES) ×2 IMPLANT
DRAPE WARM FLUID 44X44 (DRAPES) ×2 IMPLANT
DURAPREP 26ML APPLICATOR (WOUND CARE) ×2 IMPLANT
GAUZE 4X4 16PLY RFD (DISPOSABLE) ×2 IMPLANT
GLOVE ECLIPSE 6.5 STRL STRAW (GLOVE) ×2 IMPLANT
GLOVE EXAM NITRILE XL STR (GLOVE) IMPLANT
GLOVE INDICATOR 8.0 STRL GRN (GLOVE) ×4 IMPLANT
GOWN STRL REUS W/ TWL LRG LVL3 (GOWN DISPOSABLE) ×1 IMPLANT
GOWN STRL REUS W/ TWL XL LVL3 (GOWN DISPOSABLE) IMPLANT
GOWN STRL REUS W/TWL 2XL LVL3 (GOWN DISPOSABLE) ×2 IMPLANT
GOWN STRL REUS W/TWL LRG LVL3 (GOWN DISPOSABLE) ×2
GOWN STRL REUS W/TWL XL LVL3 (GOWN DISPOSABLE)
KIT BASIN OR (CUSTOM PROCEDURE TRAY) ×2 IMPLANT
KIT TURNOVER KIT B (KITS) ×2 IMPLANT
NEEDLE HYPO 25X1 1.5 SAFETY (NEEDLE) ×2 IMPLANT
NS IRRIG 1000ML POUR BTL (IV SOLUTION) ×2 IMPLANT
OIL CARTRIDGE MAESTRO DRILL (MISCELLANEOUS)
PACK SURGICAL SETUP 50X90 (CUSTOM PROCEDURE TRAY) ×2 IMPLANT
PAD ARMBOARD 7.5X6 YLW CONV (MISCELLANEOUS) ×6 IMPLANT
SPECIMEN JAR SMALL (MISCELLANEOUS) IMPLANT
STAPLER SKIN PROX WIDE 3.9 (STAPLE) IMPLANT
SUT VIC AB 3-0 SH 8-18 (SUTURE) ×2 IMPLANT
SYR CONTROL 10ML LL (SYRINGE) ×2 IMPLANT
TOWEL GREEN STERILE (TOWEL DISPOSABLE) ×2 IMPLANT
TOWEL GREEN STERILE FF (TOWEL DISPOSABLE) ×2 IMPLANT
TRAY KYPHOPAK 20/3 ONESTEP 1ST (MISCELLANEOUS) ×2 IMPLANT

## 2020-09-07 NOTE — Anesthesia Procedure Notes (Signed)
Procedure Name: Intubation Date/Time: 09/07/2020 11:22 AM Performed by: Bea Graff, RN Pre-anesthesia Checklist: Patient identified, Emergency Drugs available, Suction available and Patient being monitored Patient Re-evaluated:Patient Re-evaluated prior to induction Oxygen Delivery Method: Circle system utilized Preoxygenation: Pre-oxygenation with 100% oxygen Induction Type: IV induction Ventilation: Mask ventilation without difficulty and Oral airway inserted - appropriate to patient size Laryngoscope Size: Miller and 2 Tube type: Oral Tube size: 7.5 mm Number of attempts: 1 Airway Equipment and Method: Stylet Placement Confirmation: ETT inserted through vocal cords under direct vision,  positive ETCO2 and breath sounds checked- equal and bilateral Secured at: 23 cm Tube secured with: Tape Dental Injury: Teeth and Oropharynx as per pre-operative assessment

## 2020-09-07 NOTE — Op Note (Signed)
09/07/2020  1:14 PM  PATIENT:  [name]  87 y.o. male with an L3 compression fracture.  PRE-OPERATIVE DIAGNOSIS:  Compression fracture of Lumbar three vertebra  POST-OPERATIVE DIAGNOSIS:  Compression fracture of Lumbar three vertebra  PROCEDURE:  Procedure(s): Lumbar three KYPHOPLASTY  SURGEON:  Surgeon(s): Ashok Pall, MD  ANESTHESIA:   general  EBL:  Total I/O In: 1100 [I.V.:500; IV Piggyback:600] Out: 25 [Blood:25]  BLOOD ADMINISTERED:none  COUNT:per nursing  SPECIMEN:  No Specimen  DICTATION: Mr. Skowron was taken to the operating room, intubated and placed under a general anesthetic without difficulty. He was positioned prone on the operating room table with all pressure points properly padded. Her back was prepped and draped in a sterile manner. With fluoroscopy I localized the L3 pedicles bilaterally. I injected lidocaine into the entry sites on both the left and right sides. I started by making a stab incision on the left side and entering the left L3 pedicle with fluoroscopic guidance. Once good position was obtained, I drilled into the vertebral body. I then placed the kyphoplasty balloon into the L3 vertebra and inflated the balloon. I then inserted 4.5 of methylmethacrylate into the vertebral body under fluoroscopic guidance. I achieved a good fill of the cavity, staying within the confines of the vertebral body. I repeated the same procedure on the right side and injected 1.5cc methylmethacrylate.  I removed the instrumentation from the vertebral body, and the final films looked good. I closed the stab incision with vicryl suture and used Dermabond for a sterile dressing. After extubation Mr. Jaskiewicz blood pressure dropped, he desaturated, and developed bradycardia, he was reintubated in the room, given colloids, and epinephrine along with other drugs. He responded well, and was taken to the Pacu intubated. He was extubated shortly thereafter following all commands,  and moving all extremities.    PLAN OF CARE: discharge to home  PATIENT DISPOSITION:  PACU - hemodynamically stable.   Delay start of Pharmacological VTE agent (>24hrs) due to surgical blood loss or risk of bleeding:  yes

## 2020-09-07 NOTE — Anesthesia Postprocedure Evaluation (Signed)
Anesthesia Post Note  Patient: ANTHONE PRIEUR  Procedure(s) Performed: Lumbar three KYPHOPLASTY (N/A Back)     Patient location during evaluation: PACU Anesthesia Type: General Level of consciousness: sedated Pain management: pain level controlled Vital Signs Assessment: post-procedure vital signs reviewed and stable Respiratory status: spontaneous breathing and respiratory function stable Cardiovascular status: stable Postop Assessment: no apparent nausea or vomiting Anesthetic complications: yes   Encounter Complications  Complication Outcome Phase Comment  Anaphylaxis  Intraprocedure Probable Allergic reaction intra-op, treated with benedryl, dexamethasone, and epinephrine.  Pt developed hypotension, bradycardia which resolved with medical treatment and reintubation, then extubation.    Last Vitals:  Vitals:   09/07/20 1600 09/07/20 1615  BP: (!) 110/58 (!) 113/56  Pulse: 69 66  Resp: 13 15  Temp:    SpO2: 97% 94%    Last Pain:  Vitals:   09/07/20 1615  TempSrc:   PainSc: Asleep                 Jonnae Fonseca DANIEL

## 2020-09-07 NOTE — Discharge Instructions (Signed)
Care After ?These instructions give you information on caring for yourself after your procedure. Your doctor may also give you more specific instructions. Call your doctor if you have any problems or questions after your procedure. ?HOME CARE ?Take medicine as told by your doctor. ?Keep your wound covered for 24 hours or as told by your doctor. ?Ask your doctor when you can bathe or shower. ?Put ice on your wound if it helps your pain. ?Put ice in a plastic bag. ?Place a towel between your skin and the bag. ?Leave the ice on for 15 to 20 minutes, 3 to 4 times a day. ? ?Return to normal activities as told by your doctor. ?Ask your doctor what stretches and exercises you can do. ?Do not bend or lift anything heavy as told by your doctor. ?GET HELP RIGHT AWAY IF:  ?You have bad back pain that comes on suddenly. ?You cannot control when you pee (urinate) or poop (bowel movement). ?You lose feeling (numbness) in your legs or feet, or they become weak. ?You have shooting pain down your legs. ?You have a fever. ?Your wound becomes red, puffy (swollen), or tender to the touch. ?You are bleeding or leaking fluid from the wound. ?You are sick to your stomach (nauseous) or throw up (vomit) for more than 24 hours after the procedure. ?Your back pain does not get better. ?MAKE SURE YOU: ?Understand these instructions. ?Will watch your condition. ?Will get help right away if you are not doing well or get worse. ?Document Released: 10/24/2009 Document Revised: 10/22/2011 Document Reviewed: 01/03/2011 ?ExitCare? Patient Information ?2013 [employer]  ?

## 2020-09-07 NOTE — Transfer of Care (Signed)
Immediate Anesthesia Transfer of Care Note  Patient: ZAKARI BATHE  Procedure(s) Performed: Lumbar three KYPHOPLASTY (N/A Back)  Patient Location: PACU  Anesthesia Type:General  Level of Consciousness: awake and drowsy  Airway & Oxygen Therapy: Patient Spontanous Breathing and Patient connected to face mask oxygen  Post-op Assessment: Report given to RN and Post -op Vital signs reviewed and stable  Post vital signs: Reviewed and stable  Last Vitals:  Vitals Value Taken Time  BP 101/76 09/07/20 1301  Temp 35.8 C 09/07/20 1247  Pulse 70 09/07/20 1305  Resp 15 09/07/20 1305  SpO2 98 % 09/07/20 1305  Vitals shown include unvalidated device data.  Last Pain:  Vitals:   09/07/20 0927  TempSrc:   PainSc: 8       Patients Stated Pain Goal: 2 (48/27/07 8675)  Complications: No complications documented.

## 2020-09-07 NOTE — Anesthesia Preprocedure Evaluation (Addendum)
Anesthesia Evaluation  Patient identified by MRN, date of birth, ID band Patient awake  General Assessment Comment:Hx/o tooth damage   Reviewed: Allergy & Precautions, NPO status , Patient's Chart, lab work & pertinent test results, Unable to perform ROS - Chart review only  History of Anesthesia Complications (+) history of anesthetic complications  Airway Mallampati: III  TM Distance: >3 FB Neck ROM: Full    Dental  (+) Implants, Dental Advisory Given,   Hx of Dental injury with anesthesia:   Pulmonary former smoker,    Pulmonary exam normal        Cardiovascular hypertension, Pt. on medications and Pt. on home beta blockers + CAD and +CHF  Normal cardiovascular exam  Echo 08/10/18 Left ventricle: The cavity size was mildly dilated. Wall thickness was increased in a pattern of mild LVH. Systolicfunction was normal. The estimated ejection fraction was in therange of 55% to 60%. Doppler parameters are consistent withabnormal left ventricular relaxation (grade 1 diastolicdysfunction).  - Aortic valve: Valve area (VTI): 3.24 cm^2. Valve area (Vmax): 3.01 cm^2. Valve area (Vmean): 3.18 cm^2.  - Mitral valve: There was mild regurgitation.  - Left atrium: The atrium was mildly dilated.  - Pericardium, extracardiac: A trivial pericardial effusion was identified.    Neuro/Psych  Headaches,  Neuromuscular disease negative psych ROS   GI/Hepatic negative GI ROS, Neg liver ROS,   Endo/Other  Hyperlipidemia Gout  Renal/GU negative Renal ROS   ED BPH    Musculoskeletal  (+) Arthritis , Osteoarthritis,  Fx left patella   Abdominal   Peds  Hematology negative hematology ROS (+) anemia ,   Anesthesia Other Findings   Reproductive/Obstetrics                            Anesthesia Physical  Anesthesia Plan  ASA: III  Anesthesia Plan: General   Post-op Pain Management:    Induction:  Intravenous  PONV Risk Score and Plan: 3 and Ondansetron, Treatment may vary due to age or medical condition and Dexamethasone  Airway Management Planned: Oral ETT  Additional Equipment: None  Intra-op Plan:   Post-operative Plan: Extubation in OR  Informed Consent: I have reviewed the patients History and Physical, chart, labs and discussed the procedure including the risks, benefits and alternatives for the proposed anesthesia with the patient or authorized representative who has indicated his/her understanding and acceptance.     Dental advisory given  Plan Discussed with: Anesthesiologist and CRNA  Anesthesia Plan Comments:        Anesthesia Quick Evaluation

## 2020-09-07 NOTE — H&P (Signed)
BP 134/67   Pulse 70   Temp (!) 97.1 F (36.2 C) (Temporal)   Resp 18   Ht 6' (1.829 m)   Wt 81.9 kg   SpO2 98%   BMI 24.49 kg/m  [name] presents for a lumbar three kyphoplasty to treat a lumbar three compression fracture.  Allergies  Allergen Reactions  . Lyrica [Pregabalin] Swelling    Taking sometime as needed now  . Gabapentin Other (See Comments)    Lower extremity edema   Past Medical History:  Diagnosis Date  . ACTINIC KERATOSIS, HEAD 03/23/2008  . ALLERGIC RHINITIS 03/27/2007  . Arthritis   . BENIGN PROSTATIC HYPERTROPHY, WITH URINARY OBSTRUCTION 10/16/2007  . CAD 08/18/2007   "patient denies any issues with heart, does not see cardiologist"  . Carpal tunnel syndrome, bilateral 09/04/2016  . CARPAL TUNNEL SYNDROME, LEFT 04/13/2008  . CHF (congestive heart failure) ([place])   . Chronic back pain   . Chronic neck pain   . Complication of anesthesia    left lower jaw teeth have been injured in the past and he has lost those, now has 2 loose front teeth due to a mouth injury on 01/02/20  . DVT (deep venous thrombosis) ([place])   . ERECTILE DYSFUNCTION, ORGANIC 02/20/2010  . Gout, unspecified 02/20/2010  . Headache(784.0)    migraines hx of- started at age 35- none since 15  . History of melanoma   . HYPERLIPIDEMIA, WITH HIGH HDL 10/18/2008  . HYPERTENSION 05/09/2007   sees Dr. Benay Pillow  . KNEE PAIN 10/10/2009  . LOW BACK PAIN 03/27/2007  . NEOPLASM, MALIGNANT, SKIN, TRUNK 05/09/2007   squamous cell on scalp, sideburn and ear (right). meyloma on truckl.  . Patellar tendinitis 10/10/2009  . Sciatic nerve pain    on Oxycodone as needed  . SKIN CANCER, HX OF 03/27/2007  . UNS ADVRS EFF UNS RX MEDICINAL&BIOLOGICAL SBSTNC 08/18/2007  . Urinary urgency    Past Surgical History:  Procedure Laterality Date  . APPENDECTOMY  1969  . APPLICATION OF WOUND VAC Left 01/19/2020   Procedure: Application Of Wound Vac;  Surgeon: [name], MD;  Location: [facility];  Service: Orthopedics;   Laterality: Left;  . BLEPHAROPLASTY    . CARPAL TUNNEL RELEASE Right   . CHOLECYSTECTOMY    . ESOPHAGOGASTRODUODENOSCOPY (EGD) WITH PROPOFOL N/A 04/26/2015   Procedure: ESOPHAGOGASTRODUODENOSCOPY (EGD) WITH PROPOFOL;  Surgeon: Inda Castle, MD;  Location: [facility];  Service: Endoscopy;  Laterality: N/A;  . EYE SURGERY     bilateral cataracts removed and vitrectomies  . JOINT REPLACEMENT Right    knee  . KNEE ARTHROSCOPY    . KYPHOPLASTY N/A 06/10/2020   Procedure: Thoracic Twelve, Lumbar One, Lumbar Two Kyphoplasty;  Surgeon: Ashok Pall, MD;  Location: [facility];  Service: Neurosurgery;  Laterality: N/A;  3C/RM 21  . LAMINECTOMY    . LATERAL FUSION LUMBAR SPINE, TRANSVERSE    . LUMBAR LAMINECTOMY/DECOMPRESSION MICRODISCECTOMY Left 11/03/2012   Procedure: LUMBAR LAMINECTOMY/DECOMPRESSION MICRODISCECTOMY 1 LEVEL;  Surgeon: Hosie Spangle, MD;  Location: [facility];  Service: Neurosurgery;  Laterality: Left;  LEFT L5S1 laminotomy foraminotomy and possible microdiskectomy  . MELANOMA EXCISION     scalp  . NASAL SINUS SURGERY    . ORIF PATELLA Left 01/19/2020   Procedure: LEFT OPEN REDUCTION INTERNAL (ORIF) FIXATION PATELLA WITH AUTO GRAFTING USING HAMSTRING REINFORCEMENT;  Surgeon: [name], MD;  Location: [facility];  Service: Orthopedics;  Laterality: Left;  . POSTERIOR CERVICAL FUSION/FORAMINOTOMY Right  10/01/2012   Procedure: POSTERIOR CERVICAL FUSION/FORAMINOTOMY LEVEL 1;  Surgeon: Hosie Spangle, MD;  Location: [facility];  Service: Neurosurgery;  Laterality: Right;  Right Cervical seven thoracic one Cervical laminectomy/foraminotomy with posterior cervical arthrodesis   . POSTERIOR LUMBAR FUSION N/A 05/23/2013   Procedure: exploration of lumbar wound, explantation of left interbody implant.;  Surgeon: Hosie Spangle, MD;  Location: [facility];  Service: Neurosurgery;  Laterality: N/A;  . RECTAL SURGERY     Fissure  . TOTAL KNEE ARTHROPLASTY Left 05/07/2019   Procedure:  LEFT TOTAL KNEE ARTHROPLASTY;  Surgeon: [name], MD;  Location: [facility];  Service: Orthopedics;  Laterality: Left;  Marland Kitchen VITRECTOMY     right and left 2013   Family History  Problem Relation Age of Onset  . Dementia Mother   . Anemia Father   . Heart disease Father   . Stroke Sister        ICH  . Lung disease Sister    Social History   Socioeconomic History  . Marital status: Married    Spouse name: Not on file  . Number of children: 3  . Years of education: 40  . Highest education level: Not on file  Occupational History  . Occupation: Retired  Tobacco Use  . Smoking status: Former Smoker    Packs/day: 1.00    Years: 33.00    Pack years: 33.00    Quit date: 09/29/1981    Years since quitting: 38.9  . Smokeless tobacco: Never Used  Vaping Use  . Vaping Use: Never used  Substance and Sexual Activity  . Alcohol use: Not Currently    Comment: rare  . Drug use: No  . Sexual activity: Yes    Partners: Female    Comment: Married  Other Topics Concern  . Not on file  Social History Narrative   Lives at home w/ his wife   Left-handed   Caffeine: 1 cup coffee daily   1 son alive, 2 sons deceased   Retired Building control surveyor, Furniture conservator/restorer.    Education: GED   Social Determinants of Radio broadcast assistant Strain: Not on file  Food Insecurity: Not on file  Transportation Needs: Not on file  Physical Activity: Not on file  Stress: Not on file  Social Connections: Not on file  Intimate Partner Violence: Not on file   Prior to Admission medications   Medication Sig Start Date End Date Taking? Authorizing Provider  amLODipine (NORVASC) 10 MG tablet TAKE 1 TABLET BY MOUTH EVERY DAY Patient taking differently: Take 10 mg by mouth daily. 07/12/20  Yes Hoyt Koch, MD  carvedilol (COREG) 25 MG tablet TAKE 1 TABLET BY MOUTH 2 TIMES DAILY WITH A MEAL Patient taking differently: Take 25 mg by mouth 2 (two) times daily with a meal. 07/12/20  Yes Hoyt Koch,  MD  celecoxib (CELEBREX) 100 MG capsule Take 1 capsule (100 mg total) by mouth 2 (two) times daily. 08/22/20  Yes Hoyt Koch, MD  DULoxetine (CYMBALTA) 30 MG capsule Take 1 capsule (30 mg total) by mouth daily. 08/22/20  Yes Hoyt Koch, MD  fluticasone [facility]) 50 MCG/ACT nasal spray Place 1 spray into both nostrils daily as needed for allergies.   Yes [provider]  hydrALAZINE (APRESOLINE) 50 MG tablet TAKE 1 TABLET BY MOUTH TWICE A DAY Patient taking differently: Take 50 mg by mouth in the morning and at bedtime. 08/23/20  Yes Hoyt Koch, MD  magnesium citrate  SOLN Take 0.5 Bottles by mouth daily as needed for moderate constipation.   Yes [provider]  Oxycodone HCl 10 MG TABS Take 1 tablet (10 mg total) by mouth every 4 (four) hours as needed (pain score 4-6). Patient taking differently: Take 10 mg by mouth 4 (four) times daily. 06/01/19  Yes Magnant, Charles L, PA-C  predniSONE (DELTASONE) 10 MG tablet TAKE 1-4 TABLETS (10-40 MG TOTAL) BY MOUTH DAILY WITH BREAKFAST. Patient taking differently: Take 50 mg by mouth daily as needed (pain). 08/04/20  Yes Hoyt Koch, MD  spironolactone (ALDACTONE) 25 MG tablet TAKE 2 TABLETS BY MOUTH EVERY DAY Patient taking differently: Take 50 mg by mouth daily. 08/15/20  Yes Hoyt Koch, MD  tamsulosin (FLOMAX) 0.4 MG CAPS capsule TAKE 1 CAPSULE BY MOUTH EVERY DAY Patient taking differently: Take 0.4 mg by mouth at bedtime. 07/11/20  Yes Hoyt Koch, MD  torsemide (DEMADEX) 20 MG tablet Take 1-2 tablets (20-40 mg total) by mouth daily. Patient taking differently: Take 40-100 mg by mouth daily as needed (fluid). 07/25/20  Yes Hoyt Koch, MD  Ketorolac Tromethamine 15.75 MG/SPRAY SOLN Place 1 spray into the nose 3 (three) times daily as needed. Patient not taking: Reported on 09/05/2020 07/25/20   Hoyt Koch, MD  ondansetron (ZOFRAN-ODT) 8 MG disintegrating  tablet TAKE 1 TABLET (8 MG TOTAL) BY MOUTH EVERY 8 (EIGHT) HOURS AS NEEDED FOR NAUSEA OR VOMITING. 07/05/20   Hoyt Koch, MD   Physical Exam Constitutional:      General: He is in acute distress.     Appearance: Normal appearance. He is normal weight.  HENT:     Head: Normocephalic and atraumatic.     Right Ear: Tympanic membrane normal.     Left Ear: Tympanic membrane normal.     Nose: Nose normal.     Mouth/Throat:     Mouth: Mucous membranes are moist.     Pharynx: Oropharynx is clear.  Eyes:     Extraocular Movements: Extraocular movements intact.     Conjunctiva/sclera: Conjunctivae normal.     Pupils: Pupils are equal, round, and reactive to light.  Cardiovascular:     Rate and Rhythm: Normal rate and regular rhythm.     Pulses: Normal pulses.  Pulmonary:     Effort: Pulmonary effort is normal.  Abdominal:     General: Abdomen is flat.     Palpations: Abdomen is soft.  Musculoskeletal:        General: Normal range of motion.     Cervical back: Normal range of motion.  Skin:    General: Skin is warm and dry.  Neurological:     General: No focal deficit present.     Mental Status: He is alert and oriented to person, place, and time.     Cranial Nerves: No cranial nerve deficit.     Sensory: No sensory deficit.     Coordination: Coordination normal.     Gait: Gait abnormal.     Deep Tendon Reflexes: Reflexes normal.  Psychiatric:        Mood and Affect: Mood normal.        Behavior: Behavior normal.        Thought Content: Thought content normal.        Judgment: Judgment normal.   A/P [name] is a 87 y.o. male With a new compression fracture at L3. He will undergo a kyphoplasty at L3. Risks and benefits including but not limited  to bleeding, no pain relief, damage to the spinal cord, spinal cord infarction, and other risks were discussed. He understands and wishes to proceed.

## 2020-09-08 ENCOUNTER — Observation Stay (HOSPITAL_COMMUNITY): Payer: Medicare Other

## 2020-09-08 ENCOUNTER — Encounter (HOSPITAL_COMMUNITY): Payer: Self-pay | Admitting: Neurosurgery

## 2020-09-08 DIAGNOSIS — S32030A Wedge compression fracture of third lumbar vertebra, initial encounter for closed fracture: Secondary | ICD-10-CM | POA: Diagnosis not present

## 2020-09-08 LAB — URINALYSIS, ROUTINE W REFLEX MICROSCOPIC
Bilirubin Urine: NEGATIVE
Glucose, UA: NEGATIVE mg/dL
Ketones, ur: NEGATIVE mg/dL
Nitrite: NEGATIVE
Protein, ur: NEGATIVE mg/dL
Specific Gravity, Urine: 1.015 (ref 1.005–1.030)
pH: 5 (ref 5.0–8.0)

## 2020-09-08 LAB — COMPREHENSIVE METABOLIC PANEL
ALT: 10 U/L (ref 0–44)
AST: 16 U/L (ref 15–41)
Albumin: 3.2 g/dL — ABNORMAL LOW (ref 3.5–5.0)
Alkaline Phosphatase: 61 U/L (ref 38–126)
Anion gap: 12 (ref 5–15)
BUN: 33 mg/dL — ABNORMAL HIGH (ref 8–23)
CO2: 24 mmol/L (ref 22–32)
Calcium: 8.9 mg/dL (ref 8.9–10.3)
Chloride: 102 mmol/L (ref 98–111)
Creatinine, Ser: 1.57 mg/dL — ABNORMAL HIGH (ref 0.61–1.24)
GFR, Estimated: 42 mL/min — ABNORMAL LOW (ref >60)
Glucose, Bld: 137 mg/dL — ABNORMAL HIGH (ref 70–99)
Potassium: 3.6 mmol/L (ref 3.5–5.1)
Sodium: 138 mmol/L (ref 135–145)
Total Bilirubin: 0.8 mg/dL (ref 0.3–1.2)
Total Protein: 5.4 g/dL — ABNORMAL LOW (ref 6.5–8.1)

## 2020-09-08 LAB — LIPASE, BLOOD: Lipase: 18 U/L (ref 11–51)

## 2020-09-08 LAB — AMYLASE: Amylase: 59 U/L (ref 28–100)

## 2020-09-08 MED ORDER — SALINE SPRAY 0.65 % NA SOLN
1.0000 | NASAL | Status: DC | PRN
  Filled 2020-09-08: qty 44

## 2020-09-08 MED ORDER — HYDROXYZINE HCL 50 MG/ML IM SOLN
25.0000 mg | Freq: Four times a day (QID) | INTRAMUSCULAR | Status: DC | PRN
  Administered 2020-09-08: 25 mg via INTRAMUSCULAR
  Filled 2020-09-08: qty 1

## 2020-09-08 NOTE — Evaluation (Signed)
Occupational Therapy Evaluation Patient Details Name: [name] MRN: [id number] DOB: 1932-02-06 Today's Date: 09/08/2020    History of Present Illness 87 y.o. male presenting with L3 compression fx s/p kyphoplasty. Patient recently admitted in 07/2020 with cellulitis and acute renal failure. PMHx significant for previous multiple back surgeries, R TKA 2021, ORIF L patella 2021, CAD, CHF, lumbago, HTN, HLD, Hx of melanoma and skin cancer.   Clinical Impression   PTA patient was living with his wife Jana Half in a 2-level private residence with level entry and elevator to bedroom/bathroom on 2nd floor. Patient reports independence with ADLs/IADLs without AD. Patient also states that he was driving. Jana Half his wife does not drive. Patient currently presents below baseline level of function demonstrating Min A grossly for self-care tasks with use of RW. Evaluation limited 2/2 lethargy and pain with patient tolerating 3 lateral steps toward HOB prior to return to supine. OT provided education on spinal precautions. Patient would benefit from continued education. Patient would also benefit from continued acute OT services to maximize safety and independence with self-care tasks, ADL transfers, and household mobility in prep for ADLs. Recommendation for return home with 24hr supervision/assist from family and [place].     Follow Up Recommendations  Home health OT;Supervision/Assistance - 24 hour    Equipment Recommendations  None recommended by OT (Patient has necessary DME.)    Recommendations for Other Services       Precautions / Restrictions Precautions Precautions: Back;Fall Precaution Booklet Issued: Yes (comment) Precaution Comments: Reviewed precautions with patient. Will likely require continued education. Required Braces or Orthoses:  (No brace needed) Restrictions Weight Bearing Restrictions: No      Mobility Bed Mobility Overal bed mobility: Needs Assistance Bed Mobility:  Rolling;Sidelying to Sit;Sit to Supine Rolling: Min guard Sidelying to sit: Min guard   Sit to supine: Min assist   General bed mobility comments: Min guard for rolling to R and for sidelying to sit +rail and HOB slightly elevated. Increased time/effort. Min A to elevate LLE from EOB to bed surface 2/2 pain from recent fall.    Transfers Overall transfer level: Needs assistance Equipment used: Rolling walker (2 wheeled) Transfers: Sit to/from Stand Sit to Stand: Min guard;Min assist         General transfer comment: Min guard from elevated surface and Min A from low surface. Good awareness of proper hand placement.    Balance Overall balance assessment: Needs assistance Sitting-balance support: Single extremity supported;Feet supported Sitting balance-Leahy Scale: Fair Sitting balance - Comments: Able to maintain static sitting at EOB with Min guard for safety.   Standing balance support: Bilateral upper extremity supported;During functional activity Standing balance-Leahy Scale: Poor Standing balance comment: Reliant on BUE on RW.                           ADL either performed or assessed with clinical judgement   ADL Overall ADL's : Needs assistance/impaired Eating/Feeding: Set up;Bed level   Grooming: Set up;Sitting Grooming Details (indicate cue type and reason): Face washing seated EOB with set-up assist.         Upper Body Dressing : Set up;Sitting Upper Body Dressing Details (indicate cue type and reason): Donned anterior hospital gown. Lower Body Dressing: Moderate assistance;Sit to/from stand Lower Body Dressing Details (indicate cue type and reason): Unable to attain figure-4 position requiring Mod A to don footwear seated EOB.  Functional mobility during ADLs: Min guard;Minimal assistance;Rolling walker General ADL Comments: Min gaurd for 3 steps toward [facility] with use of RW. Further mobility deferred for patient/therapist safety.      Vision Baseline Vision/History: Wears glasses Patient Visual Report: No change from baseline Vision Assessment?: No apparent visual deficits     Perception     Praxis      Pertinent Vitals/Pain Pain Assessment: Faces Faces Pain Scale: Hurts even more Pain Location: Low back Pain Descriptors / Indicators: Grimacing;Guarding;Moaning Pain Intervention(s): Limited activity within patient's tolerance;Monitored during session;Repositioned     Hand Dominance Left   Extremity/Trunk Assessment Upper Extremity Assessment Upper Extremity Assessment: Generalized weakness   Lower Extremity Assessment Lower Extremity Assessment: Defer to PT evaluation   Cervical / Trunk Assessment Cervical / Trunk Assessment: Other exceptions Cervical / Trunk Exceptions: s/p spinal surgery   Communication Communication Communication: No difficulties   Cognition Arousal/Alertness: Lethargic;Suspect due to medications Behavior During Therapy: Flat affect Overall Cognitive Status: Impaired/Different from baseline Area of Impairment: Orientation;Awareness;Problem solving                 Orientation Level: Disoriented to;Time         Awareness: Anticipatory Problem Solving: Slow processing General Comments: Patient able to recall place and situation but not time. Incraesed time to process verbal information.   General Comments  Patient c/o nausea upon sitting EOB. Vitals assessed and WNL.    Exercises     Shoulder Instructions      Home Living Family/patient expects to be discharged to:: Private residence Living Arrangements: Spouse/significant other Available Help at Discharge: Family;Available 24 hours/day (Son and niece live close by) Type of Home: House Home Access: Elevator     Home Layout: Two level     Bathroom Shower/Tub: Walk-in Psychologist, prison and probation services: [place] - single point;Walker - 2 wheels;Crutches;Bedside  commode;Shower seat;Grab bars - tub/shower          Prior Functioning/Environment Level of Independence: Independent        Comments: Patient is a questionable historian. Reports living with his wife but denies being her caregiver. Per chart review, patient reports caring for his wife at time of previous admission last month. Reports fall 1-2 weeks ago with L knee swelling still present.        OT Problem List: Decreased strength;Decreased activity tolerance;Impaired balance (sitting and/or standing);Decreased knowledge of use of DME or AE;Decreased knowledge of precautions      OT Treatment/Interventions: Self-care/ADL training;Therapeutic exercise;Energy conservation;DME and/or AE instruction;Therapeutic activities;Patient/family education;Balance training    OT Goals(Current goals can be found in the care plan section) Acute Rehab OT Goals Patient Stated Goal: To feel better. OT Goal Formulation: With patient Time For Goal Achievement: 09/22/20 Potential to Achieve Goals: Good ADL Goals Pt Will Perform Grooming: with modified independence;standing Pt Will Perform Upper Body Dressing: with modified independence;sitting Pt Will Perform Lower Body Dressing: with modified independence;sitting/lateral leans;sit to/from stand;with adaptive equipment Pt Will Transfer to Toilet: with modified independence Pt Will Perform Toileting - Clothing Manipulation and hygiene: with modified independence;sit to/from stand  OT Frequency: Min 2X/week   Barriers to D/C:            Co-evaluation              AM-PAC OT "6 Clicks" Daily Activity     Outcome Measure Help from another person eating meals?: None Help from another person taking care of personal grooming?:  A Little Help from another person toileting, which includes using toliet, bedpan, or urinal?: A Little Help from another person bathing (including washing, rinsing, drying)?: A Lot Help from another person to put on and  taking off regular upper body clothing?: A Little Help from another person to put on and taking off regular lower body clothing?: A Lot 6 Click Score: 17   End of Session Equipment Utilized During Treatment: Gait belt;Rolling walker Nurse Communication: Mobility status  Activity Tolerance: Treatment limited secondary to medical complications (Comment);Other (comment) (N/V and lethargy) Patient left: in bed;with call bell/phone within reach;with bed alarm set  OT Visit Diagnosis: Unsteadiness on feet (R26.81);Muscle weakness (generalized) (M62.81);History of falling (Z91.81);Pain Pain - Right/Left:  (bilateral) Pain - part of body:  (low back)                Time: 5638-7564 OT Time Calculation (min): 28 min Charges:  OT General Charges $OT Visit: 1 Visit OT Evaluation $OT Eval Moderate Complexity: 1 Mod OT Treatments $Self Care/Home Management : 8-22 mins  Kahleah Crass H. OTR/L Supplemental OT, Department of rehab services [phone]  Cobi Delph R H. 09/08/2020, 9:02 AM

## 2020-09-08 NOTE — Evaluation (Signed)
Physical Therapy Evaluation Patient Details Name: [name] MRN: [id number] DOB: [birth year] Today's Date: 09/08/2020   History of Present Illness  87 y.o. male presenting with L3 compression fx s/p kyphoplasty. Patient recently admitted in 07/2020 with cellulitis and acute renal failure, and underwent T12-L2 kyphoplasties 05/2020. PMHx significant for previous multiple back surgeries, R TKA 2021, ORIF L patella 2021, CAD, CHF, HTN, Hx of melanoma and skin cancer.    Clinical Impression  Pt admitted with above diagnosis. At the time of PT eval, pt was able to demonstrate transfers and ambulation with increased time and gross min assist with RW for support. Pt nauseated throughout session and with notable trunk/UE jerking which pt reports is new since surgery. Pt was educated on precautions, brace application/wearing schedule, appropriate activity progression, and car transfer. Pt currently with functional limitations due to the deficits listed below (see PT Problem List). Pt will benefit from skilled PT to increase their independence and safety with mobility to allow discharge to the venue listed below.      Follow Up Recommendations Home health PT;Supervision/Assistance - 24 hour    Equipment Recommendations  None recommended by PT    Recommendations for Other Services       Precautions / Restrictions Precautions Precautions: Back;Fall Precaution Booklet Issued: Yes (comment) Precaution Comments: Verbally reviewed precautions during functional mobility. Required Braces or Orthoses:  (No brace needed order) Restrictions Weight Bearing Restrictions: No      Mobility  Bed Mobility Overal bed mobility: Needs Assistance Bed Mobility: Rolling;Sidelying to Sit;Sit to Supine Rolling: Min guard Sidelying to sit: Min guard   Sit to supine: Min assist   General bed mobility comments: Assist for proper log roll technique, trunk elevation to full sitting position, and for LE elevation  back up into bed at end of session.    Transfers Overall transfer level: Needs assistance Equipment used: Rolling walker (2 wheeled) Transfers: Sit to/from Stand Sit to Stand: Min assist         General transfer comment: Assist for power-up to full standing position. Increased time to transition hands from bed to walker.  Ambulation/Gait Ambulation/Gait assistance: Min assist Gait Distance (Feet): 15 Feet Assistive device: Rolling walker (2 wheeled) Gait Pattern/deviations: Step-through pattern;Decreased stride length;Trunk flexed Gait velocity: Decreased Gait velocity interpretation: <1.31 ft/sec, indicative of household ambulator General Gait Details: Slow and guarded. Pt only wanting to ambulate in room only as he was fearful of falling.  Stairs            Wheelchair Mobility    Modified Rankin (Stroke Patients Only)       Balance Overall balance assessment: Needs assistance Sitting-balance support: Single extremity supported;Feet supported Sitting balance-Leahy Scale: Fair Sitting balance - Comments: Noted jerking in trunk/UE's in sitting   Standing balance support: Bilateral upper extremity supported;During functional activity Standing balance-Leahy Scale: Poor Standing balance comment: Reliant on BUE on RW.                             Pertinent Vitals/Pain Pain Assessment: Faces Faces Pain Scale: Hurts even more Pain Location: Low back Pain Descriptors / Indicators: Grimacing;Guarding;Moaning Pain Intervention(s): Limited activity within patient's tolerance;Monitored during session;Repositioned    Home Living Family/patient expects to be discharged to:: Private residence Living Arrangements: Spouse/significant other Available Help at Discharge: Family;Available 24 hours/day (Son and niece live close by) Type of Home: House Home Access: Elevator     Home Layout: Two level Home  Equipment: Kasandra Knudsen - quad;Cane - single point;Walker - 2  wheels;Crutches;Bedside commode;Shower seat;Grab bars - tub/shower      Prior Function Level of Independence: Independent         Comments: Patient is a questionable historian. Reports living with his wife but denies being her caregiver. Per chart review, patient reports caring for his wife at time of previous admission last month. Reports fall 1-2 weeks ago with L knee swelling still present.     Hand Dominance   Dominant Hand: Left    Extremity/Trunk Assessment   Upper Extremity Assessment Upper Extremity Assessment: Generalized weakness    Lower Extremity Assessment Lower Extremity Assessment: Generalized weakness    Cervical / Trunk Assessment Cervical / Trunk Assessment: Other exceptions Cervical / Trunk Exceptions: s/p spinal surgery  Communication   Communication: No difficulties  Cognition Arousal/Alertness: Lethargic;Suspect due to medications Behavior During Therapy: Flat affect Overall Cognitive Status: Impaired/Different from baseline Area of Impairment: Orientation;Problem solving                 Orientation Level: Disoriented to;Time           Problem Solving: Slow processing General Comments: . Increased time to process verbal information.      General Comments      Exercises     Assessment/Plan    PT Assessment Patient needs continued PT services  PT Problem List Decreased strength;Decreased activity tolerance;Decreased balance;Decreased mobility;Decreased knowledge of use of DME;Decreased safety awareness;Decreased knowledge of precautions;Pain       PT Treatment Interventions DME instruction;Gait training;Functional mobility training;Therapeutic activities;Therapeutic exercise;Patient/family education    PT Goals (Current goals can be found in the Care Plan section)  Acute Rehab PT Goals Patient Stated Goal: To feel better. PT Goal Formulation: With patient Time For Goal Achievement: 09/15/20 Potential to Achieve Goals:  Good    Frequency Min 5X/week   Barriers to discharge        Co-evaluation               AM-PAC PT "6 Clicks" Mobility  Outcome Measure Help needed turning from your back to your side while in a flat bed without using bedrails?: A Little Help needed moving from lying on your back to sitting on the side of a flat bed without using bedrails?: A Little Help needed moving to and from a bed to a chair (including a wheelchair)?: A Little Help needed standing up from a chair using your arms (e.g., wheelchair or bedside chair)?: A Little Help needed to walk in hospital room?: A Little Help needed climbing 3-5 steps with a railing? : A Lot 6 Click Score: 17    End of Session Equipment Utilized During Treatment: Gait belt Activity Tolerance: Patient tolerated treatment well Patient left: in bed;with call bell/phone within reach Nurse Communication: Mobility status PT Visit Diagnosis: Unsteadiness on feet (R26.81);Pain Pain - part of body:  (back)    Time: 3825-0539 PT Time Calculation (min) (ACUTE ONLY): 25 min   Charges:   PT Evaluation $PT Eval Moderate Complexity: 1 Mod PT Treatments $Gait Training: 8-22 mins        Rolinda Roan, PT, DPT Acute Rehabilitation Services Pager: [phone] Office: [phone]   Thelma Comp 09/08/2020, 12:37 PM

## 2020-09-09 DIAGNOSIS — S32030A Wedge compression fracture of third lumbar vertebra, initial encounter for closed fracture: Secondary | ICD-10-CM | POA: Diagnosis not present

## 2020-09-09 LAB — URINE CULTURE: Culture: NO GROWTH

## 2020-09-09 NOTE — Progress Notes (Signed)
Patient ID: MACARTHUR LORUSSO, male   DOB: 09/21/1931, 87 y.o.   MRN: [id number] BP 121/60 (BP Location: Left Arm)   Pulse 78   Temp 98.2 F (36.8 C) (Oral)   Resp 20   Ht 6' (1.829 m)   Wt 81.9 kg   SpO2 97%   BMI 24.49 kg/m  Alert oriented x 4 Sitting up earlier, moving lower extremities kub was normal.  Results for orders placed or performed during the hospital encounter of 09/07/20 (from the past 24 hour(s))  Lipase, blood     Status: None   Collection Time: 09/08/20 11:19 AM  Result Value Ref Range   Lipase 18 11 - 51 U/L  Amylase     Status: None   Collection Time: 09/08/20 11:19 AM  Result Value Ref Range   Amylase 59 28 - 100 U/L  Comprehensive metabolic panel     Status: Abnormal   Collection Time: 09/08/20 11:19 AM  Result Value Ref Range   Sodium 138 135 - 145 mmol/L   Potassium 3.6 3.5 - 5.1 mmol/L   Chloride 102 98 - 111 mmol/L   CO2 24 22 - 32 mmol/L   Glucose, Bld 137 (H) 70 - 99 mg/dL   BUN 33 (H) 8 - 23 mg/dL   Creatinine, Ser 1.57 (H) 0.61 - 1.24 mg/dL   Calcium 8.9 8.9 - 10.3 mg/dL   Total Protein 5.4 (L) 6.5 - 8.1 g/dL   Albumin 3.2 (L) 3.5 - 5.0 g/dL   AST 16 15 - 41 U/L   ALT 10 0 - 44 U/L   Alkaline Phosphatase 61 38 - 126 U/L   Total Bilirubin 0.8 0.3 - 1.2 mg/dL   GFR, Estimated 42 (L) >60 mL/min   Anion gap 12 5 - 15  Urinalysis, Routine w reflex microscopic Urine, Catheterized     Status: Abnormal   Collection Time: 09/08/20  4:51 PM  Result Value Ref Range   Color, Urine YELLOW YELLOW   APPearance CLEAR CLEAR   Specific Gravity, Urine 1.015 1.005 - 1.030   pH 5.0 5.0 - 8.0   Glucose, UA NEGATIVE NEGATIVE mg/dL   Hgb urine dipstick SMALL (A) NEGATIVE   Bilirubin Urine NEGATIVE NEGATIVE   Ketones, ur NEGATIVE NEGATIVE mg/dL   Protein, ur NEGATIVE NEGATIVE mg/dL   Nitrite NEGATIVE NEGATIVE   Leukocytes,Ua TRACE (A) NEGATIVE   RBC / HPF 0-5 0 - 5 RBC/hpf   WBC, UA 0-5 0 - 5 WBC/hpf   Bacteria, UA RARE (A) NONE SEEN   Squamous Epithelial /  LPF 0-5 0 - 5   Mucus PRESENT    Hyaline Casts, UA PRESENT    Uric Acid Crys, UA PRESENT   he is being hydrated. No apparent uti. Will continue iv fluids Will need more work with PT

## 2020-09-09 NOTE — Discharge Summary (Signed)
Physician Discharge Summary  Patient ID: [name] MRN: [id number] DOB/AGE: 85-Mar-1933 85 y.o.  Admit date: 09/07/2020 Discharge date: 09/09/2020  Admission Diagnoses:L3 compression fracture  Discharge Diagnoses: same Active Problems:   Lumbar compression fracture [facility])   Discharged Condition: good  Hospital Course: Mr. Hauss underwent an uncomplicatd kyphoplasty. Post op he was reluctant to ambulate or participate in PT. He, today has reversed course is desirous of returning home. He is voiding, ambulating, and tolerating a regular die  Treatments: surgery: Kyphoplasty, L3  Discharge Exam: Blood pressure (!) 121/58, pulse 69, temperature 98.1 F (36.7 C), temperature source Oral, resp. rate 17, height 6' (1.829 m), weight 81.9 kg, SpO2 95 %. General appearance: alert, cooperative and no distress  Disposition:  Compression fracture of Lumbar three vertebra  Allergies as of 09/09/2020      Reactions   Lyrica [pregabalin] Swelling   Taking sometime as needed now   Gabapentin Other (See Comments)   Lower extremity edema      Medication List    TAKE these medications   amLODipine 10 MG tablet Commonly known as: NORVASC TAKE 1 TABLET BY MOUTH EVERY DAY   carvedilol 25 MG tablet Commonly known as: COREG TAKE 1 TABLET BY MOUTH 2 TIMES DAILY WITH A MEAL What changed: See the new instructions.   celecoxib 100 MG capsule Commonly known as: CeleBREX Take 1 capsule (100 mg total) by mouth 2 (two) times daily.   DULoxetine 30 MG capsule Commonly known as: Cymbalta Take 1 capsule (30 mg total) by mouth daily.   fluticasone 50 MCG/ACT nasal spray Commonly known as: FLONASE Place 1 spray into both nostrils daily as needed for allergies.   hydrALAZINE 50 MG tablet Commonly known as: APRESOLINE TAKE 1 TABLET BY MOUTH TWICE A DAY What changed: when to take this   Ketorolac Tromethamine 15.75 MG/SPRAY Soln Place 1 spray into the nose 3 (three) times daily as  needed.   magnesium citrate Soln Take 0.5 Bottles by mouth daily as needed for moderate constipation.   ondansetron 8 MG disintegrating tablet Commonly known as: ZOFRAN-ODT TAKE 1 TABLET (8 MG TOTAL) BY MOUTH EVERY 8 (EIGHT) HOURS AS NEEDED FOR NAUSEA OR VOMITING.   Oxycodone HCl 10 MG Tabs Take 1 tablet (10 mg total) by mouth every 4 (four) hours as needed (pain score 4-6). What changed: when to take this   predniSONE 10 MG tablet Commonly known as: DELTASONE TAKE 1-4 TABLETS (10-40 MG TOTAL) BY MOUTH DAILY WITH BREAKFAST. What changed: See the new instructions.   spironolactone 25 MG tablet Commonly known as: ALDACTONE TAKE 2 TABLETS BY MOUTH EVERY DAY   tamsulosin 0.4 MG Caps capsule Commonly known as: FLOMAX TAKE 1 CAPSULE BY MOUTH EVERY DAY What changed: when to take this   torsemide 20 MG tablet Commonly known as: Demadex Take 1-2 tablets (20-40 mg total) by mouth daily. What changed:   how much to take  when to take this  reasons to take this       Follow-up Information    Ashok Pall, MD In 3 weeks.   Specialty: Neurosurgery Why: please call for an appointment Contact information: [address] [place] [address] [phone]               Signed: Ashok Pall 09/09/2020, 2:35 PM

## 2020-09-09 NOTE — Plan of Care (Signed)
Patient alert and oriented  with no c/o pain, voiding adequately and ready for discharged. Patient discharged home as ordered. Patient has all belongings at bedside. Patient has an appointment with MD in 3 weeks

## 2020-09-09 NOTE — Progress Notes (Signed)
Occupational Therapy Treatment Patient Details Name: [name] MRN: [id number] DOB: [birth year] Today's Date: 09/09/2020    History of present illness 87 y.o. male presenting with L3 compression fx s/p kyphoplasty. Patient recently admitted in 07/2020 with cellulitis and acute renal failure, and underwent T12-L2 kyphoplasties 05/2020. PMHx significant for previous multiple back surgeries, R TKA 2021, ORIF L patella 2021, CAD, CHF, HTN, Hx of melanoma and skin cancer.   OT comments  Pt making steady progress towards OT goals this session. Pt continues to present with decreased activity tolerance, pain, and back precautions impacting pts ability to complete BADLs independently. Pt currently requires min guard for bed mobility, supervision for UB ADLS at sink, and min guard for toilet transfer. Pt would continue to benefit from skilled occupational therapy while admitted and after d/c to address the below listed limitations in order to improve overall functional mobility and facilitate independence with BADL participation. DC plan remains appropriate, will follow acutely per POC.     Follow Up Recommendations  Home health OT;Supervision/Assistance - 24 hour    Equipment Recommendations  None recommended by OT;Other (comment) (Patient has necessary DME)    Recommendations for Other Services      Precautions / Restrictions Precautions Precautions: Back;Fall Precaution Booklet Issued: No Precaution Comments: pt able to state 3/3 preacautions during session, but required cues during ADLs to carryover into routine Required Braces or Orthoses:  (No brace needed order) Restrictions Weight Bearing Restrictions: No       Mobility Bed Mobility Overal bed mobility: Needs Assistance Bed Mobility: Rolling;Sidelying to Sit Rolling: Min guard Sidelying to sit: Min guard       General bed mobility comments: pt able to initiate log roll, cues for technique and sequencing of task, min guard  for safety and line mgmt  Transfers Overall transfer level: Needs assistance Equipment used: Rolling walker (2 wheeled) Transfers: Sit to/from Stand Sit to Stand: Min guard         General transfer comment: min guard to rise from EOB and toilet seat, cues for hand placement on grab bars    Balance Overall balance assessment: Needs assistance Sitting-balance support: No upper extremity supported;Feet supported Sitting balance-Leahy Scale: Good Sitting balance - Comments: able to sit EOB with no UE support, no jerking noted this session   Standing balance support: No upper extremity supported;During functional activity Standing balance-Leahy Scale: Fair Standing balance comment: pt able to stand at sink to complete oral care with no UE support, close supervision for safety                           ADL either performed or assessed with clinical judgement   ADL Overall ADL's : Needs assistance/impaired     Grooming: Standing;Oral care;Supervision/safety;Cueing for compensatory techniques;Cueing for safety Grooming Details (indicate cue type and reason): cues to stand up straight during ADLs and cues to incorporate compensatory method of using 2 cups vs spitting into sink               Lower Body Dressing Details (indicate cue type and reason): pt reports at home he does not wear socks and he "steps into his shoes" Toilet Transfer: Min guard;RW;Ambulation;Grab Information systems manager Details (indicate cue type and reason): min guard for safety, mostly for line mgmt and cues for safety related to reaching back for grab bar       Tub/Shower Transfer Details (indicate cue type and reason): pt reports  walkin shower with seat Functional mobility during ADLs: Min guard;Supervision/safety;Rolling walker General ADL Comments: pt completing household distance functional mobility, toileting, and UB ADLs at sink     Vision       Perception     Praxis       Cognition Arousal/Alertness: Awake/alert Behavior During Therapy: Flat affect Overall Cognitive Status: Impaired/Different from baseline                               Problem Solving: Slow processing;Requires verbal cues General Comments: slow to process commands and requires verbal cuse to carryover education        Exercises     Shoulder Instructions       General Comments      Pertinent Vitals/ Pain       Pain Assessment: 0-10 Pain Score: 6  Pain Location: Low back, RLE Pain Descriptors / Indicators: Grimacing;Guarding;Moaning;Shooting;Discomfort Pain Intervention(s): Monitored during session;Repositioned  Home Living                                          Prior Functioning/Environment              Frequency  Min 2X/week        Progress Toward Goals  OT Goals(current goals can now be found in the care plan section)  Progress towards OT goals: Progressing toward goals  Acute Rehab OT Goals Patient Stated Goal: To feel better. OT Goal Formulation: With patient Time For Goal Achievement: 09/22/20 Potential to Achieve Goals: Good  Plan Discharge plan remains appropriate;Frequency remains appropriate    Co-evaluation                 AM-PAC OT "6 Clicks" Daily Activity     Outcome Measure   Help from another person eating meals?: None Help from another person taking care of personal grooming?: A Little Help from another person toileting, which includes using toliet, bedpan, or urinal?: A Little Help from another person bathing (including washing, rinsing, drying)?: A Little Help from another person to put on and taking off regular upper body clothing?: A Little Help from another person to put on and taking off regular lower body clothing?: A Lot 6 Click Score: 18    End of Session Equipment Utilized During Treatment: Gait belt;Rolling walker  OT Visit Diagnosis: Unsteadiness on feet (R26.81);Muscle weakness  (generalized) (M62.81);History of falling (Z91.81);Pain Pain - Right/Left: Right (RLE) Pain - part of body:  (back, RLE)   Activity Tolerance Patient tolerated treatment well   Patient Left in chair;with call bell/phone within reach   Nurse Communication Mobility status        Time: 8563-1497 OT Time Calculation (min): 20 min  Charges: OT General Charges $OT Visit: 1 Visit OT Treatments $Self Care/Home Management : 8-22 mins  Harley Alto., COTA/L Acute Rehabilitation Services [phone]   Precious Haws 09/09/2020, 9:35 AM

## 2020-09-09 NOTE — Progress Notes (Signed)
Physical Therapy Treatment Patient Details Name: [name] MRN: [id number] DOB: [birth year] Today's Date: 09/09/2020    History of Present Illness 87 y.o. male presenting with L3 compression fx s/p kyphoplasty. Patient recently admitted in 07/2020 with cellulitis and acute renal failure, and underwent T12-L2 kyphoplasties 05/2020. PMHx significant for previous multiple back surgeries, R TKA 2021, ORIF L patella 2021, CAD, CHF, HTN, Hx of melanoma and skin cancer.    PT Comments    Pt progressing well with post-op mobility. He was able to demonstrate transfers and ambulation with gross min guard assist and RW for support. Pt with no complaints of nausea, lightheadedness, or jerking that was noted last session. Pt was educated on precautions, brace application/wearing schedule, appropriate activity progression, and car transfer. Will continue to follow.      Follow Up Recommendations  Home health PT;Supervision/Assistance - 24 hour     Equipment Recommendations  None recommended by PT    Recommendations for Other Services       Precautions / Restrictions Precautions Precautions: Back;Fall Precaution Booklet Issued: No Precaution Comments: pt able to state 3/3 precautions during session Required Braces or Orthoses:  (No brace needed order) Restrictions Weight Bearing Restrictions: No    Mobility  Bed Mobility Overal bed mobility: Needs Assistance Bed Mobility: Rolling;Sidelying to Sit;Sit to Sidelying Rolling: Supervision Sidelying to sit: Min guard     Sit to sidelying: Min guard General bed mobility comments: VC's for optimal log roll technique. Pt required multimodal cues for maintenance of precautions and log roll with return to bed at end of session.  Transfers Overall transfer level: Needs assistance Equipment used: Rolling walker (2 wheeled) Transfers: Sit to/from Stand Sit to Stand: Min guard         General transfer comment: Hands on guarding for safety  as pt powered up to full stand.  Ambulation/Gait Ambulation/Gait assistance: Min guard Gait Distance (Feet): 300 Feet Assistive device: Rolling walker (2 wheeled) Gait Pattern/deviations: Step-through pattern;Decreased stride length;Trunk flexed Gait velocity: Decreased Gait velocity interpretation: <1.31 ft/sec, indicative of household ambulator General Gait Details: Generally flexed trunk throughout gait training with little improvement able to be made with VC's. Pt reports improved pain with ambulation this session and was able to progress distance as well.   Stairs             Wheelchair Mobility    Modified Rankin (Stroke Patients Only)       Balance Overall balance assessment: Needs assistance Sitting-balance support: No upper extremity supported;Feet supported Sitting balance-Leahy Scale: Good Sitting balance - Comments: able to sit EOB with no UE support, no jerking noted this session   Standing balance support: No upper extremity supported;During functional activity Standing balance-Leahy Scale: Fair Standing balance comment: pt able to stand at sink to complete oral care with no UE support, close supervision for safety                            Cognition Arousal/Alertness: Awake/alert Behavior During Therapy: Flat affect Overall Cognitive Status: Impaired/Different from baseline Area of Impairment: Problem solving                             Problem Solving: Slow processing General Comments: slow to process commands and requires verbal cues to carryover education      Exercises      General Comments  Pertinent Vitals/Pain Pain Assessment: Faces Pain Score: 6  Faces Pain Scale: Hurts little more Pain Location: Low back, RLE Pain Descriptors / Indicators: Grimacing;Discomfort;Operative site guarding Pain Intervention(s): Limited activity within patient's tolerance;Monitored during session;Repositioned    Home  Living                      Prior Function            PT Goals (current goals can now be found in the care plan section) Acute Rehab PT Goals Patient Stated Goal: To feel better. PT Goal Formulation: With patient Time For Goal Achievement: 09/15/20 Potential to Achieve Goals: Good Progress towards PT goals: Progressing toward goals    Frequency    Min 5X/week      PT Plan Current plan remains appropriate    Co-evaluation              AM-PAC PT "6 Clicks" Mobility   Outcome Measure  Help needed turning from your back to your side while in a flat bed without using bedrails?: A Little Help needed moving from lying on your back to sitting on the side of a flat bed without using bedrails?: A Little Help needed moving to and from a bed to a chair (including a wheelchair)?: A Little Help needed standing up from a chair using your arms (e.g., wheelchair or bedside chair)?: A Little Help needed to walk in hospital room?: A Little Help needed climbing 3-5 steps with a railing? : A Lot 6 Click Score: 17    End of Session Equipment Utilized During Treatment: Gait belt Activity Tolerance: Patient tolerated treatment well Patient left: in bed;with call bell/phone within reach Nurse Communication: Mobility status PT Visit Diagnosis: Unsteadiness on feet (R26.81);Pain Pain - part of body:  (back)     Time: 3382-5053 PT Time Calculation (min) (ACUTE ONLY): 16 min  Charges:  $Gait Training: 8-22 mins                     Rolinda Roan, PT, DPT Acute Rehabilitation Services Pager: [phone] Office: [phone]    Thelma Comp 09/09/2020, 12:33 PM

## 2020-09-13 ENCOUNTER — Other Ambulatory Visit: Payer: Self-pay | Admitting: Internal Medicine

## 2020-09-14 ENCOUNTER — Telehealth: Payer: Self-pay | Admitting: Internal Medicine

## 2020-09-14 ENCOUNTER — Other Ambulatory Visit: Payer: Self-pay | Admitting: Internal Medicine

## 2020-09-14 NOTE — Telephone Encounter (Signed)
Ok to leave verbal orders?

## 2020-09-14 NOTE — Telephone Encounter (Signed)
Judson Roch [facility] calling, states the patient got a new skin tear and is requesting verbal orders to clean dry and apply antibiotic cream  Judson Roch- [phone] Okay to LVM

## 2020-09-15 NOTE — Telephone Encounter (Signed)
Fine

## 2020-09-16 NOTE — Telephone Encounter (Signed)
LVM giving Judson Roch verbal orders. Office number provided in case she has additional questions or concerns.

## 2020-09-23 ENCOUNTER — Other Ambulatory Visit: Payer: Self-pay | Admitting: Internal Medicine

## 2020-09-29 NOTE — Telephone Encounter (Signed)
See below

## 2020-09-29 NOTE — Telephone Encounter (Signed)
[name] calling to report patient was seen today. Pitting edema right leg 2+, left leg 1+  Requesting issue to be discussed during next office visit with Dr Sharlet Salina

## 2020-10-06 ENCOUNTER — Encounter: Payer: Self-pay | Admitting: Internal Medicine

## 2020-10-06 ENCOUNTER — Ambulatory Visit (INDEPENDENT_AMBULATORY_CARE_PROVIDER_SITE_OTHER): Payer: Medicare Other | Admitting: Internal Medicine

## 2020-10-06 ENCOUNTER — Other Ambulatory Visit: Payer: Self-pay

## 2020-10-06 VITALS — BP 128/84 | HR 75 | Temp 98.0°F | Resp 18 | Ht 72.0 in | Wt 194.8 lb

## 2020-10-06 DIAGNOSIS — I10 Essential (primary) hypertension: Secondary | ICD-10-CM

## 2020-10-06 DIAGNOSIS — M7989 Other specified soft tissue disorders: Secondary | ICD-10-CM

## 2020-10-06 DIAGNOSIS — M818 Other osteoporosis without current pathological fracture: Secondary | ICD-10-CM

## 2020-10-06 DIAGNOSIS — S32000S Wedge compression fracture of unspecified lumbar vertebra, sequela: Secondary | ICD-10-CM | POA: Diagnosis not present

## 2020-10-06 LAB — BRAIN NATRIURETIC PEPTIDE: Pro B Natriuretic peptide (BNP): 212 pg/mL — ABNORMAL HIGH (ref 0.0–100.0)

## 2020-10-06 LAB — COMPREHENSIVE METABOLIC PANEL
ALT: 8 U/L (ref 0–53)
AST: 15 U/L (ref 0–37)
Albumin: 3.7 g/dL (ref 3.5–5.2)
Alkaline Phosphatase: 82 U/L (ref 39–117)
BUN: 30 mg/dL — ABNORMAL HIGH (ref 6–23)
CO2: 29 mEq/L (ref 19–32)
Calcium: 9.7 mg/dL (ref 8.4–10.5)
Chloride: 102 mEq/L (ref 96–112)
Creatinine, Ser: 1.54 mg/dL — ABNORMAL HIGH (ref 0.40–1.50)
GFR: 40.41 mL/min — ABNORMAL LOW (ref >60.00)
Glucose, Bld: 110 mg/dL — ABNORMAL HIGH (ref 70–99)
Potassium: 4.6 mEq/L (ref 3.5–5.1)
Sodium: 138 mEq/L (ref 135–145)
Total Bilirubin: 0.4 mg/dL (ref 0.2–1.2)
Total Protein: 6.6 g/dL (ref 6.0–8.3)

## 2020-10-06 LAB — CBC
HCT: 35 % — ABNORMAL LOW (ref 39.0–52.0)
Hemoglobin: 11.3 g/dL — ABNORMAL LOW (ref 13.0–17.0)
MCHC: 32.2 g/dL (ref 30.0–36.0)
MCV: 86.3 fl (ref 78.0–100.0)
Platelets: 549 10*3/uL — ABNORMAL HIGH (ref 150.0–400.0)
RBC: 4.06 Mil/uL — ABNORMAL LOW (ref 4.22–5.81)
RDW: 15 % (ref 11.5–15.5)
WBC: 10.3 10*3/uL (ref 4.0–10.5)

## 2020-10-06 NOTE — Progress Notes (Signed)
   Subjective:   Patient ID: [name], male    DOB: [birth year], 87 y.o.   MRN: [id number]  HPI The patient is an 87 YO man coming in for several concerns including back pain (overall improving now since most recent kyphoplasty, is not taking prednisone any longer, is taking celebrex and oxycodone for pain, is able to tolerate it better now and be more active) and leg swelling (chronic for awhile, had been on furosemide which stopped working, then switch to torsemide which has not worked well, they are down in the morning and increase in size throughout the day, recent echo he wants to discuss from Dec 2021) and bone strength (due to the fractures recently he is worried about the bone strength, he has stopped prednisone as he remembered [place] talking about this before and it was not helping for pain any longer several weeks ago, wants to avoid another fracture in his back).   Review of Systems  Constitutional: Positive for activity change.  HENT: Negative.   Eyes: Negative.   Respiratory: Negative for cough, chest tightness and shortness of breath.   Cardiovascular: Positive for leg swelling. Negative for chest pain and palpitations.  Gastrointestinal: Negative for abdominal distention, abdominal pain, constipation, diarrhea, nausea and vomiting.  Musculoskeletal: Positive for arthralgias and back pain.  Skin: Negative.   Neurological: Negative.   Psychiatric/Behavioral: Negative.     Objective:  Physical Exam Constitutional:      Appearance: He is well-developed and well-nourished.  HENT:     Head: Normocephalic and atraumatic.  Eyes:     Extraocular Movements: EOM normal.  Cardiovascular:     Rate and Rhythm: Normal rate and regular rhythm.  Pulmonary:     Effort: Pulmonary effort is normal. No respiratory distress.     Breath sounds: Normal breath sounds. No wheezing or rales.  Abdominal:     General: Bowel sounds are normal. There is no distension.     Palpations: Abdomen is  soft.     Tenderness: There is no abdominal tenderness. There is no rebound.  Musculoskeletal:        General: Tenderness present.     Cervical back: Normal range of motion.     Right lower leg: Edema present.     Left lower leg: Edema present.     Comments: Stable edema, no signs of cellulitis  Skin:    General: Skin is warm and dry.  Neurological:     Mental Status: He is alert and oriented to person, place, and time.     Coordination: Coordination normal.  Psychiatric:        Mood and Affect: Mood and affect normal.     Vitals:   10/06/20 1028  BP: 128/84  Pulse: 75  Resp: 18  Temp: 98 F (36.7 C)  TempSrc: Oral  SpO2: 99%  Weight: 194 lb 12.8 oz (88.4 kg)  Height: 6' (1.829 m)    This visit occurred during the SARS-CoV-2 public health emergency.  Safety protocols were in place, including screening questions prior to the visit, additional usage of staff PPE, and extensive cleaning of exam room while observing appropriate contact time as indicated for disinfecting solutions.   Assessment & Plan:

## 2020-10-06 NOTE — Patient Instructions (Addendum)
We will work on the prolia shots twice a year for the bone strength.   We will switch back to the lasix (furosemide) to take 80 mg daily.  We will check the blood work today.

## 2020-10-07 ENCOUNTER — Telehealth: Payer: Self-pay | Admitting: Internal Medicine

## 2020-10-07 DIAGNOSIS — M810 Age-related osteoporosis without current pathological fracture: Secondary | ICD-10-CM | POA: Insufficient documentation

## 2020-10-07 NOTE — Telephone Encounter (Signed)
Spoke with the patient and advised him that Dr. Sharlet Salina wants him to take Lasix 80 mg daily as discussed in his appointment yesterday. He was very appreciative for the call back. No other questions or concerns at this time.

## 2020-10-07 NOTE — Assessment & Plan Note (Signed)
Needs to start prolia shots to address his bone strength with multiple compression fractures in the last year. Has had some pain relief with prolia and has stopped prednisone.

## 2020-10-07 NOTE — Telephone Encounter (Signed)
   Patient calling for clarification on raking lasix (furosemide) to take 80 mg daily.

## 2020-10-07 NOTE — Assessment & Plan Note (Signed)
Will return to lasix which he has at home to see if this helps. Suspect more venous insufficiency and we talked about today how swelling tends to stay and become more chronic the longer it is present. Checking CBC, CMP, BNP today. Discussed echo results and that the diastolic dysfunction is likely normal for age.

## 2020-10-07 NOTE — Assessment & Plan Note (Signed)
Agrees to starting prolia injections to help prevent additional compression fracture.

## 2020-10-10 NOTE — Telephone Encounter (Signed)
See below

## 2020-10-10 NOTE — Telephone Encounter (Signed)
Patient requesting order for Lasix be sent to pharmacy on file

## 2020-10-11 MED ORDER — FUROSEMIDE 80 MG PO TABS: 80.0000 mg | ORAL_TABLET | Freq: Every day | ORAL | 3 refills | Status: DC

## 2020-10-11 NOTE — Addendum Note (Signed)
Addended by: Pricilla Holm A on: 10/11/2020 01:13 PM   Modules accepted: Orders

## 2020-10-11 NOTE — Telephone Encounter (Signed)
Sent in lasix.

## 2020-10-23 ENCOUNTER — Other Ambulatory Visit: Payer: Self-pay | Admitting: Internal Medicine

## 2020-10-25 ENCOUNTER — Telehealth: Payer: Self-pay | Admitting: Internal Medicine

## 2020-10-25 NOTE — Telephone Encounter (Signed)
New Prolia start, benefits investigation initiated 3.18.22, once have results will contact the patient

## 2020-10-28 ENCOUNTER — Telehealth: Payer: Self-pay | Admitting: Internal Medicine

## 2020-10-28 NOTE — Telephone Encounter (Signed)
Patient wanted to make Dr. Sharlet Salina aware that he has no fluid in his legs any longer and hasn't had to take a diuretic in the past 4 days.

## 2020-10-28 NOTE — Telephone Encounter (Signed)
Noted  

## 2020-10-28 NOTE — Telephone Encounter (Signed)
See below

## 2020-11-09 ENCOUNTER — Ambulatory Visit (INDEPENDENT_AMBULATORY_CARE_PROVIDER_SITE_OTHER): Payer: Medicare Other | Admitting: Orthopedic Surgery

## 2020-11-09 ENCOUNTER — Ambulatory Visit: Payer: Self-pay

## 2020-11-09 ENCOUNTER — Encounter: Payer: Self-pay | Admitting: Orthopedic Surgery

## 2020-11-09 DIAGNOSIS — G5602 Carpal tunnel syndrome, left upper limb: Secondary | ICD-10-CM | POA: Diagnosis not present

## 2020-11-09 DIAGNOSIS — M25562 Pain in left knee: Secondary | ICD-10-CM | POA: Diagnosis not present

## 2020-11-09 DIAGNOSIS — S82032G Displaced transverse fracture of left patella, subsequent encounter for closed fracture with delayed healing: Secondary | ICD-10-CM

## 2020-11-09 NOTE — Progress Notes (Signed)
I called and we talked about his situation.  He may come back in at some point in the near future to get his left hand addressed.  Otherwise he is going to stay the course.

## 2020-11-09 NOTE — Progress Notes (Signed)
Office Visit Note   Patient: [name]           Date of Birth: [birth year]           MRN: [id number] Visit Date: 11/09/2020 Requested by: Hoyt Koch, MD [address] [place],  [place] [address] PCP: Hoyt Koch, MD  Subjective: Chief Complaint  Patient presents with  . Left Knee - Pain    HPI: [name] is a 87 y.o. male who presents to the office complaining of left knee instability.  Patient has history of left total knee arthroplasty with subsequent left patella fracture.  He underwent left patella fracture ORIF on 01/19/2020 but fixation failed it had he had significant superior retraction of proximal patella.  Over the last several months he has had increased weakness in the left leg when he is ambulatory.  It is giving out on him 2-3 times over the last 69-month.  Which has resulted in multiple falls.  He says this does not happen often but when they do their "bad falls".  He has had multiple fractures in his lumbar spine that required kyphoplasty.  He had kyphoplasty of T12, L1, L2, L3 by Dr. Christella Noa.  He ambulates with a walker at all times.   Patient also reports 6 months of left hand numbness/tingling.  He feels this is similar to the right carpal tunnel syndrome he experienced on his right hand.  Had carpal tunnel release on his right side years ago with good relief.              ROS: All systems reviewed are negative as they relate to the chief complaint within the history of present illness.  Patient denies fevers or chills.  Assessment & Plan: Visit Diagnoses:  1. Displaced transverse fracture of left patella, subsequent encounter for closed fracture with delayed healing   2. Left knee pain, unspecified chronicity   3. Left carpal tunnel syndrome     Plan: Patient is a 87 year old male who is almost 10 months out from left knee patella ORIF.  Fixation had failure with proximal migration of the proximal patella.  He still has some function in  his extensor mechanism on exam today, able to extend the leg and perform straight leg raise.  However this has caused instability of the left knee with several falls over the last several months that have resulted in compression fractures throughout his lumbar spine.  He is here today for recheck and to see if there are any options regarding his knee.  Discussed patient's pathology with him today and reviewed the radiographs.  Patient understands that this is a difficult problem and there is no reliable solution.  There is potential for use of an allograft to reconnect the extensor mechanism but this is not a 100% success option and especially has the potential to make his knee problem worse if the repair were to fail or if his knee were to become infected with another surgery.  A prosthetic joint infection has a high likelihood given that this would be the second time reopening his total knee replacement and such infection could be very life-threatening for him at his current age.  Patient understands this and plan to hold off on any surgical management at this time.  He would like to have his carpal tunnel released on the left side in the future.  He does have history of nerve conduction study from January 2018 that showed moderate carpal tunnel on  the right and mild carpal tunnel on the left.  He had the right released but states that the left is now causing him significant numbness and tingling over the last 6 months.  Plan for patient to reach out to the office when he is ready to schedule for procedure.  Follow-Up Instructions: No follow-ups on file.   Orders:  Orders Placed This Encounter  Procedures  . XR Knee 1-2 Views Left   No orders of the defined types were placed in this encounter.     Procedures: No procedures performed   Clinical Data: No additional findings.  Objective: Vital Signs: There were no vitals taken for this visit.  Physical Exam:  Constitutional: Patient appears  well-developed HEENT:  Head: Normocephalic Eyes:EOM are normal Neck: Normal range of motion Cardiovascular: Normal rate Pulmonary/chest: Effort normal Neurologic: Patient is alert Skin: Skin is warm Psychiatric: Patient has normal mood and affect  Ortho Exam: Ortho exam demonstrates left knee with intact incision with small abrasion over the distal aspect of the incision from a prior fall.  There is no dehiscence of the incision.  No drainage coming from the incision.  No significant warmth over the knee.  There is proximal migration of the patella that is palpable.  He is able to extend the knee to full extension and perform a straight leg raise as well.  Describes tingling and numbness in the left ring finger, long finger, index finger.  Tingling worsens in the long and index fingers with Phalen test.  Negative Tinel sign.  No tenderness throughout the wrist.  Specialty Comments:  No specialty comments available.  Imaging: No results found.   PMFS History: Patient Active Problem List   Diagnosis Date Noted  . Osteoporosis 10/07/2020  . Lumbar compression fracture ([place]) 09/07/2020  . Sepsis ([place]) 08/09/2020  . Cellulitis 08/08/2020  . Acute renal failure ([place]) 08/08/2020  . Greater trochanteric bursitis of both hips 05/04/2020  . Elevated d-dimer 05/04/2020  . Cellulitis of leg, right 02/03/2020  . Left patella fracture 01/19/2020  . Rib pain on left side 09/18/2019  . Arthritis of left knee 05/07/2019  . Pre-operative clearance 04/28/2019  . Chronic diastolic CHF (congestive heart failure) ([place]) 08/10/2018  . Leg wound, left 03/19/2018  . Right arm pain 11/11/2017  . Primary osteoarthritis of right foot 08/21/2017  . Syncope 02/27/2017  . Arthritis of right ankle 12/18/2016  . Carpal tunnel syndrome, bilateral 09/04/2016  . Leg swelling 08/16/2016  . Hand weakness 07/12/2016  . Degenerative arthritis of left knee 06/28/2016  . Iron deficiency anemia 03/16/2016  .  Arthritis of left acromioclavicular joint 01/03/2016  . Rotator cuff disorder 01/03/2016  . Osteoarthritis of left knee 04/30/2015  . Baker's cyst of knee 04/30/2015  . Esophageal stricture 04/26/2015  . Benign prostatic hyperplasia 02/21/2015  . Constipation 02/21/2015  . Myalgia and myositis 11/18/2014  . Polyarticular arthritis 07/19/2013  . Chronic radicular low back pain 12/15/2012  . History of gout 02/20/2010  . ERECTILE DYSFUNCTION, ORGANIC 02/20/2010  . Hyperlipidemia 10/18/2008  . CAD (coronary artery disease) 08/18/2007  . Essential hypertension 05/09/2007  . ALLERGIC RHINITIS 03/27/2007   Past Medical History:  Diagnosis Date  . ACTINIC KERATOSIS, HEAD 03/23/2008  . ALLERGIC RHINITIS 03/27/2007  . Arthritis   . BENIGN PROSTATIC HYPERTROPHY, WITH URINARY OBSTRUCTION 10/16/2007  . CAD 08/18/2007   "patient denies any issues with heart, does not see cardiologist"  . Carpal tunnel syndrome, bilateral 09/04/2016  . CARPAL TUNNEL SYNDROME, LEFT 04/13/2008  .  CHF (congestive heart failure) ([place])   . Chronic back pain   . Chronic neck pain   . Complication of anesthesia    left lower jaw teeth have been injured in the past and he has lost those, now has 2 loose front teeth due to a mouth injury on 01/02/20  . DVT (deep venous thrombosis) ([place])   . ERECTILE DYSFUNCTION, ORGANIC 02/20/2010  . Gout, unspecified 02/20/2010  . Headache(784.0)    migraines hx of- started at age 53- none since 33  . History of melanoma   . HYPERLIPIDEMIA, WITH HIGH HDL 10/18/2008  . HYPERTENSION 05/09/2007   sees Dr. Benay Pillow  . KNEE PAIN 10/10/2009  . LOW BACK PAIN 03/27/2007  . NEOPLASM, MALIGNANT, SKIN, TRUNK 05/09/2007   squamous cell on scalp, sideburn and ear (right). meyloma on truckl.  . Patellar tendinitis 10/10/2009  . Sciatic nerve pain    on Oxycodone as needed  . SKIN CANCER, HX OF 03/27/2007  . UNS ADVRS EFF UNS RX MEDICINAL&BIOLOGICAL SBSTNC 08/18/2007  . Urinary urgency     Family  History  Problem Relation Age of Onset  . Dementia Mother   . Anemia Father   . Heart disease Father   . Stroke Sister        ICH  . Lung disease Sister     Past Surgical History:  Procedure Laterality Date  . APPENDECTOMY  1969  . APPLICATION OF WOUND VAC Left 01/19/2020   Procedure: Application Of Wound Vac;  Surgeon: [name], MD;  Location: [facility];  Service: Orthopedics;  Laterality: Left;  . BLEPHAROPLASTY    . CARPAL TUNNEL RELEASE Right   . CHOLECYSTECTOMY    . ESOPHAGOGASTRODUODENOSCOPY (EGD) WITH PROPOFOL N/A 04/26/2015   Procedure: ESOPHAGOGASTRODUODENOSCOPY (EGD) WITH PROPOFOL;  Surgeon: Inda Castle, MD;  Location: [facility];  Service: Endoscopy;  Laterality: N/A;  . EYE SURGERY     bilateral cataracts removed and vitrectomies  . JOINT REPLACEMENT Right    knee  . KNEE ARTHROSCOPY    . KYPHOPLASTY N/A 06/10/2020   Procedure: Thoracic Twelve, Lumbar One, Lumbar Two Kyphoplasty;  Surgeon: Ashok Pall, MD;  Location: [facility];  Service: Neurosurgery;  Laterality: N/A;  3C/RM 21  . KYPHOPLASTY N/A 09/07/2020   Procedure: Lumbar three KYPHOPLASTY;  Surgeon: Ashok Pall, MD;  Location: [facility];  Service: Neurosurgery;  Laterality: N/A;  . LAMINECTOMY    . LATERAL FUSION LUMBAR SPINE, TRANSVERSE    . LUMBAR LAMINECTOMY/DECOMPRESSION MICRODISCECTOMY Left 11/03/2012   Procedure: LUMBAR LAMINECTOMY/DECOMPRESSION MICRODISCECTOMY 1 LEVEL;  Surgeon: Hosie Spangle, MD;  Location: [facility];  Service: Neurosurgery;  Laterality: Left;  LEFT L5S1 laminotomy foraminotomy and possible microdiskectomy  . MELANOMA EXCISION     scalp  . NASAL SINUS SURGERY    . ORIF PATELLA Left 01/19/2020   Procedure: LEFT OPEN REDUCTION INTERNAL (ORIF) FIXATION PATELLA WITH AUTO GRAFTING USING HAMSTRING REINFORCEMENT;  Surgeon: [name], MD;  Location: [facility];  Service: Orthopedics;  Laterality: Left;  . POSTERIOR CERVICAL FUSION/FORAMINOTOMY Right 10/01/2012   Procedure:  POSTERIOR CERVICAL FUSION/FORAMINOTOMY LEVEL 1;  Surgeon: Hosie Spangle, MD;  Location: [facility];  Service: Neurosurgery;  Laterality: Right;  Right Cervical seven thoracic one Cervical laminectomy/foraminotomy with posterior cervical arthrodesis   . POSTERIOR LUMBAR FUSION N/A 05/23/2013   Procedure: exploration of lumbar wound, explantation of left interbody implant.;  Surgeon: Hosie Spangle, MD;  Location: [facility];  Service: Neurosurgery;  Laterality: N/A;  .  RECTAL SURGERY     Fissure  . TOTAL KNEE ARTHROPLASTY Left 05/07/2019   Procedure: LEFT TOTAL KNEE ARTHROPLASTY;  Surgeon: [name], MD;  Location: [facility];  Service: Orthopedics;  Laterality: Left;  Marland Kitchen VITRECTOMY     right and left 2013   Social History   Occupational History  . Occupation: Retired  Tobacco Use  . Smoking status: Former Smoker    Packs/day: 1.00    Years: 33.00    Pack years: 33.00    Quit date: 09/29/1981    Years since quitting: 39.1  . Smokeless tobacco: Never Used  Vaping Use  . Vaping Use: Never used  Substance and Sexual Activity  . Alcohol use: Not Currently    Comment: rare  . Drug use: No  . Sexual activity: Yes    Partners: Female    Comment: Married

## 2020-11-14 ENCOUNTER — Other Ambulatory Visit: Payer: Self-pay | Admitting: Internal Medicine

## 2020-11-14 ENCOUNTER — Other Ambulatory Visit: Payer: Self-pay

## 2020-11-17 ENCOUNTER — Encounter: Payer: Self-pay | Admitting: Internal Medicine

## 2020-11-17 ENCOUNTER — Ambulatory Visit (INDEPENDENT_AMBULATORY_CARE_PROVIDER_SITE_OTHER): Payer: Medicare Other | Admitting: Internal Medicine

## 2020-11-17 ENCOUNTER — Other Ambulatory Visit: Payer: Self-pay

## 2020-11-17 DIAGNOSIS — L03115 Cellulitis of right lower limb: Secondary | ICD-10-CM

## 2020-11-17 MED ORDER — SULFAMETHOXAZOLE-TRIMETHOPRIM 800-160 MG PO TABS: 1.0000 | ORAL_TABLET | Freq: Every day | ORAL | 3 refills | Status: DC

## 2020-11-17 NOTE — Patient Instructions (Addendum)
We have sent in bactrim to take 1 pill daily to prevent cellulitis again on the legs.   You can try yoga or tai chi to help with balance or google core muscle exercises.   Back Exercises These exercises help to make your trunk and back strong. They also help to keep the lower back flexible. Doing these exercises can help to prevent back pain or lessen existing pain.  If you have back pain, try to do these exercises 2-3 times each day or as told by your doctor.  As you get better, do the exercises once each day. Repeat the exercises more often as told by your doctor.  To stop back pain from coming back, do the exercises once each day, or as told by your doctor. Exercises Single knee to chest Do these steps 3-5 times in a row for each leg: 1. Lie on your back on a firm bed or the floor with your legs stretched out. 2. Bring one knee to your chest. 3. Grab your knee or thigh with both hands and hold them it in place. 4. Pull on your knee until you feel a gentle stretch in your lower back or buttocks. 5. Keep doing the stretch for 10-30 seconds. 6. Slowly let go of your leg and straighten it. Pelvic tilt Do these steps 5-10 times in a row: 1. Lie on your back on a firm bed or the floor with your legs stretched out. 2. Bend your knees so they point up to the ceiling. Your feet should be flat on the floor. 3. Tighten your lower belly (abdomen) muscles to press your lower back against the floor. This will make your tailbone point up to the ceiling instead of pointing down to your feet or the floor. 4. Stay in this position for 5-10 seconds while you gently tighten your muscles and breathe evenly. Cat-cow Do these steps until your lower back bends more easily: 1. Get on your hands and knees on a firm surface. Keep your hands under your shoulders, and keep your knees under your hips. You may put padding under your knees. 2. Let your head hang down toward your chest. Tighten (contract) the  muscles in your belly. Point your tailbone toward the floor so your lower back becomes rounded like the back of a cat. 3. Stay in this position for 5 seconds. 4. Slowly lift your head. Let the muscles of your belly relax. Point your tailbone up toward the ceiling so your back forms a sagging arch like the back of a cow. 5. Stay in this position for 5 seconds.   Press-ups Do these steps 5-10 times in a row: 1. Lie on your belly (face-down) on the floor. 2. Place your hands near your head, about shoulder-width apart. 3. While you keep your back relaxed and keep your hips on the floor, slowly straighten your arms to raise the top half of your body and lift your shoulders. Do not use your back muscles. You may change where you place your hands in order to make yourself more comfortable. 4. Stay in this position for 5 seconds. 5. Slowly return to lying flat on the floor.   Bridges Do these steps 10 times in a row: 1. Lie on your back on a firm surface. 2. Bend your knees so they point up to the ceiling. Your feet should be flat on the floor. Your arms should be flat at your sides, next to your body. 3. Tighten your butt muscles  and lift your butt off the floor until your waist is almost as high as your knees. If you do not feel the muscles working in your butt and the back of your thighs, slide your feet 1-2 inches farther away from your butt. 4. Stay in this position for 3-5 seconds. 5. Slowly lower your butt to the floor, and let your butt muscles relax. If this exercise is too easy, try doing it with your arms crossed over your chest.   Belly crunches Do these steps 5-10 times in a row: 1. Lie on your back on a firm bed or the floor with your legs stretched out. 2. Bend your knees so they point up to the ceiling. Your feet should be flat on the floor. 3. Cross your arms over your chest. 4. Tip your chin a little bit toward your chest but do not bend your neck. 5. Tighten your belly muscles and  slowly raise your chest just enough to lift your shoulder blades a tiny bit off of the floor. Avoid raising your body higher than that, because it can put too much stress on your low back. 6. Slowly lower your chest and your head to the floor. Back lifts Do these steps 5-10 times in a row: 1. Lie on your belly (face-down) with your arms at your sides, and rest your forehead on the floor. 2. Tighten the muscles in your legs and your butt. 3. Slowly lift your chest off of the floor while you keep your hips on the floor. Keep the back of your head in line with the curve in your back. Look at the floor while you do this. 4. Stay in this position for 3-5 seconds. 5. Slowly lower your chest and your face to the floor. Contact a doctor if:  Your back pain gets a lot worse when you do an exercise.  Your back pain does not get better 2 hours after you exercise. If you have any of these problems, stop doing the exercises. Do not do them again unless your doctor says it is okay. Get help right away if:  You have sudden, very bad back pain. If this happens, stop doing the exercises. Do not do them again unless your doctor says it is okay. This information is not intended to replace advice given to you by your health care provider. Make sure you discuss any questions you have with your health care provider. Document Revised: 04/24/2018 Document Reviewed: 04/24/2018 Elsevier Patient Education  2021 [employer].

## 2020-11-17 NOTE — Progress Notes (Signed)
   Subjective:   Patient ID: [name], male    DOB: 1932/04/24, 87 y.o.   MRN: [id number]  HPI The patient is an 87 YO man coming in for leg swelling and redness. Concerned about recurrent cellulitis. Does not seem like it now but worried about this. Does have chronic swelling and does take lasix when able. Usually for it to work he takes lasix 160 mg daily. Denies fevers or chills. Has missed a few doses lasix lately.   Review of Systems  Constitutional: Negative.   HENT: Negative.   Eyes: Negative.   Respiratory: Negative for cough, chest tightness and shortness of breath.   Cardiovascular: Positive for leg swelling. Negative for chest pain and palpitations.  Gastrointestinal: Negative for abdominal distention, abdominal pain, constipation, diarrhea, nausea and vomiting.  Musculoskeletal: Negative.   Skin: Negative.   Neurological: Negative.   Psychiatric/Behavioral: Negative.     Objective:  Physical Exam Constitutional:      Appearance: He is well-developed.  HENT:     Head: Normocephalic and atraumatic.  Cardiovascular:     Rate and Rhythm: Normal rate and regular rhythm.  Pulmonary:     Effort: Pulmonary effort is normal. No respiratory distress.     Breath sounds: Normal breath sounds. No wheezing or rales.  Abdominal:     General: Bowel sounds are normal. There is no distension.     Palpations: Abdomen is soft.     Tenderness: There is no abdominal tenderness. There is no rebound.  Musculoskeletal:     Cervical back: Normal range of motion.     Right lower leg: Edema present.     Left lower leg: Edema present.     Comments: Swelling stable from prior, minimal erythema  Skin:    General: Skin is warm and dry.  Neurological:     Mental Status: He is alert and oriented to person, place, and time.     Coordination: Coordination normal.     Vitals:   11/17/20 1555  BP: 116/80  Pulse: 90  Resp: 18  Temp: 98.6 F (37 C)  TempSrc: Oral  SpO2: 96%  Weight:  190 lb 9.6 oz (86.5 kg)  Height: 6' (1.829 m)    This visit occurred during the SARS-CoV-2 public health emergency.  Safety protocols were in place, including screening questions prior to the visit, additional usage of staff PPE, and extensive cleaning of exam room while observing appropriate contact time as indicated for disinfecting solutions.   Assessment & Plan:

## 2020-11-18 ENCOUNTER — Encounter: Payer: Self-pay | Admitting: Internal Medicine

## 2020-11-18 NOTE — Assessment & Plan Note (Signed)
Will keep on preventative bactrim daily for now. Use lasix up to 160 mg daily for swelling.

## 2020-11-24 ENCOUNTER — Other Ambulatory Visit: Payer: Self-pay | Admitting: Internal Medicine

## 2020-11-24 ENCOUNTER — Telehealth: Payer: Self-pay | Admitting: Internal Medicine

## 2020-11-24 NOTE — Telephone Encounter (Signed)
Patient calling, states he thinks his pain is coming from inflammation and swelling. He is wondering if there is anything that can be done because he is in so much pain he can barely get out of bed.

## 2020-11-24 NOTE — Telephone Encounter (Signed)
Patient demanded to speak to someone, states he is unable to even get out of the bed. He states he needs something to help. Relayed the message of no prednisone and a visit to assess and he said he cant even get out of the bed to come see Dr. Sharlet Salina.

## 2020-11-24 NOTE — Telephone Encounter (Signed)
Should not take prednisone with the recent compression fractures in the back. Can offer visit to assess for other options.

## 2020-11-24 NOTE — Telephone Encounter (Signed)
Patient called back and said that he remembers this happening before and he took Prednisone to help. He was wondering if it could be called into [id number] - [place], [place] - [place]. Please advise

## 2020-11-24 NOTE — Telephone Encounter (Signed)
See below

## 2020-11-28 ENCOUNTER — Other Ambulatory Visit: Payer: Self-pay

## 2020-11-28 NOTE — Telephone Encounter (Signed)
Could do virtual.

## 2020-11-28 NOTE — Telephone Encounter (Signed)
Patient is scheduled to be seen in office 4/19/20222 at 10:20 am.

## 2020-11-28 NOTE — Telephone Encounter (Signed)
See below. Ok to schedule a virtual? Please advise

## 2020-11-29 ENCOUNTER — Ambulatory Visit: Payer: Medicare Other | Admitting: Internal Medicine

## 2020-11-29 ENCOUNTER — Encounter: Payer: Self-pay | Admitting: Internal Medicine

## 2020-11-29 DIAGNOSIS — M130 Polyarthritis, unspecified: Secondary | ICD-10-CM

## 2020-11-29 MED ORDER — AMITRIPTYLINE HCL 50 MG PO TABS: 50.0000 mg | ORAL_TABLET | Freq: Every day | ORAL | 3 refills | Status: DC

## 2020-11-29 MED ORDER — CELECOXIB 200 MG PO CAPS: 200.0000 mg | ORAL_CAPSULE | Freq: Two times a day (BID) | ORAL | 0 refills | Status: DC

## 2020-11-29 MED ORDER — PREDNISONE 20 MG PO TABS
40.0000 mg | ORAL_TABLET | Freq: Every day | ORAL | 0 refills | Status: DC
Start: 2020-11-29 — End: 2021-01-17

## 2020-11-29 NOTE — Patient Instructions (Addendum)
We have sent in the celebrex to take 1 pill twice a day for the pain.  We have sent in prednisone to take 2 pills daily for 5 days.  We have also sent in elavil (amitriptyline) to take 1 pill at night time. This may take up to 2 weeks before you notice a difference.   You can try a carpal tunnel brace on the left hand at night time while sleeping to see if this helps.

## 2020-11-29 NOTE — Progress Notes (Signed)
   Subjective:   Patient ID: [name], male    DOB: [birth year], 87 y.o.   MRN: [id number]  HPI The patient is an 87 YO man coming in for concerns about worsening hand and all over pain. He had previously been on prednisone all the time which was stopped after several compression fractures in spine. He is now struggling with severe pain. Taking oxycodone and states he took 50 mg today and it did not touch the pain. Has tried gabapentin and lyrica with side effects and unable to tolerate. He is taking celebrex as well 100 mg. Would like to consider prednisone course for the worsening pain and explore other options for pain.   Review of Systems  Constitutional: Positive for activity change. Negative for appetite change, fatigue, fever and unexpected weight change.  HENT: Negative.   Eyes: Negative.   Respiratory: Negative.  Negative for cough, chest tightness and shortness of breath.   Cardiovascular: Negative.  Negative for chest pain, palpitations and leg swelling.  Gastrointestinal: Negative for abdominal distention, abdominal pain, constipation, diarrhea, nausea and vomiting.  Musculoskeletal: Positive for arthralgias, back pain and myalgias.  Skin: Negative.   Neurological: Negative.  Negative for syncope, weakness and numbness.  Psychiatric/Behavioral: Negative.     Objective:  Physical Exam Constitutional:      Appearance: He is well-developed.  HENT:     Head: Normocephalic and atraumatic.  Cardiovascular:     Rate and Rhythm: Normal rate and regular rhythm.  Pulmonary:     Effort: Pulmonary effort is normal. No respiratory distress.     Breath sounds: Normal breath sounds. No wheezing or rales.  Abdominal:     General: Bowel sounds are normal. There is no distension.     Palpations: Abdomen is soft.     Tenderness: There is no abdominal tenderness. There is no rebound.  Musculoskeletal:        General: Tenderness and deformity present.     Cervical back: Normal range  of motion.     Comments: Severe deformity in the hands from arthritis  Skin:    General: Skin is warm and dry.  Neurological:     Mental Status: He is alert and oriented to person, place, and time.     Coordination: Coordination normal.     Vitals:   11/29/20 1029  BP: 136/76  Pulse: 80  Resp: 18  Temp: 98 F (36.7 C)  TempSrc: Oral  SpO2: 91%  Weight: 184 lb (83.5 kg)  Height: 6' (1.829 m)    This visit occurred during the SARS-CoV-2 public health emergency.  Safety protocols were in place, including screening questions prior to the visit, additional usage of staff PPE, and extensive cleaning of exam room while observing appropriate contact time as indicated for disinfecting solutions.   Assessment & Plan:  Visit time 25 minutes in face to face communication with patient and coordination of care, additional 10 minutes spent in record review, coordination or care, ordering tests, communicating/referring to other healthcare professionals, documenting in medical records all on the same day of the visit for total time 35 minutes spent on the visit.

## 2020-12-02 ENCOUNTER — Telehealth: Payer: Self-pay | Admitting: Internal Medicine

## 2020-12-02 NOTE — Telephone Encounter (Signed)
predniSONE (DELTASONE) 20 MG tablet Patient wondering if Dr. Sharlet Salina would be willing to extend his refill

## 2020-12-02 NOTE — Telephone Encounter (Signed)
That should not be refilled. It is meant to be a temporary course. It would not be long term.

## 2020-12-02 NOTE — Telephone Encounter (Signed)
Spoke with the patient and he verbalized understanding. He would like to know if there is anything else that he can prescribed? Please advise

## 2020-12-02 NOTE — Telephone Encounter (Signed)
We did talk about a couple of things at the visit including elavil which he could try.

## 2020-12-02 NOTE — Telephone Encounter (Signed)
See below

## 2020-12-02 NOTE — Assessment & Plan Note (Signed)
Discussed again options for pain. He is taking oxycodone from pain management and may need adjustment of this regimen. Can try 5 day course prednisone but would not do long term given multiple compression fractures. Offered today many options including increase celebrex to 200 mg BID to see if this helps more. Also offered cymbalta increase (was put on 20 mg daily) although he informs me he was not able to tolerate this so we did then offer nortripyline or elavil and we elected to rx elavil 50 mg daily to try and advised that this can take 2-4 weeks to help and likely would have to be titrated.

## 2020-12-05 NOTE — Telephone Encounter (Signed)
Patient state that he is already taking elavil. Patient mentioned that he has a appointment in [place] Wednesday afternoon about is neuropathy. He stated that he would call us with a update after his appointment. No other questions or concerns at this time.

## 2020-12-21 ENCOUNTER — Other Ambulatory Visit: Payer: Self-pay | Admitting: Internal Medicine

## 2021-01-03 ENCOUNTER — Telehealth: Payer: Self-pay | Admitting: Internal Medicine

## 2021-01-03 NOTE — Telephone Encounter (Signed)
LVM for pt to rtn my call to schedule AWV with [facility]. Please schedule AWV if pt calls the office  

## 2021-01-04 NOTE — Telephone Encounter (Signed)
Patient is not up to having the wellness visit at this time, prefers to wait as he is in bed dealing with neuropathy and is seeing doctors on Tuesdays and Thursdays.  Patient states that if he does have it, a phone call is better, but would like to wait until later

## 2021-01-05 ENCOUNTER — Other Ambulatory Visit: Payer: Self-pay | Admitting: Internal Medicine

## 2021-01-06 ENCOUNTER — Other Ambulatory Visit: Payer: Self-pay | Admitting: Internal Medicine

## 2021-01-11 ENCOUNTER — Emergency Department (HOSPITAL_COMMUNITY): Payer: Medicare Other

## 2021-01-11 ENCOUNTER — Inpatient Hospital Stay (HOSPITAL_COMMUNITY)
Admission: EM | Admit: 2021-01-11 | Discharge: 2021-01-17 | DRG: 178 | Disposition: A | Discharge status: Skilled Nursing Facility | Payer: Medicare Other | Attending: Internal Medicine | Admitting: Internal Medicine

## 2021-01-11 ENCOUNTER — Other Ambulatory Visit: Payer: Self-pay

## 2021-01-11 ENCOUNTER — Encounter (HOSPITAL_COMMUNITY): Payer: Self-pay

## 2021-01-11 DIAGNOSIS — W19XXXS Unspecified fall, sequela: Secondary | ICD-10-CM | POA: Diagnosis not present

## 2021-01-11 DIAGNOSIS — N400 Enlarged prostate without lower urinary tract symptoms: Secondary | ICD-10-CM | POA: Diagnosis present

## 2021-01-11 DIAGNOSIS — R296 Repeated falls: Secondary | ICD-10-CM | POA: Diagnosis present

## 2021-01-11 DIAGNOSIS — M109 Gout, unspecified: Secondary | ICD-10-CM | POA: Diagnosis present

## 2021-01-11 DIAGNOSIS — I2510 Atherosclerotic heart disease of native coronary artery without angina pectoris: Secondary | ICD-10-CM | POA: Diagnosis present

## 2021-01-11 DIAGNOSIS — Z79899 Other long term (current) drug therapy: Secondary | ICD-10-CM

## 2021-01-11 DIAGNOSIS — J9811 Atelectasis: Secondary | ICD-10-CM | POA: Diagnosis present

## 2021-01-11 DIAGNOSIS — W19XXXA Unspecified fall, initial encounter: Secondary | ICD-10-CM | POA: Diagnosis present

## 2021-01-11 DIAGNOSIS — M4854XA Collapsed vertebra, not elsewhere classified, thoracic region, initial encounter for fracture: Secondary | ICD-10-CM | POA: Diagnosis present

## 2021-01-11 DIAGNOSIS — Z888 Allergy status to other drugs, medicaments and biological substances status: Secondary | ICD-10-CM | POA: Diagnosis not present

## 2021-01-11 DIAGNOSIS — I951 Orthostatic hypotension: Secondary | ICD-10-CM | POA: Diagnosis present

## 2021-01-11 DIAGNOSIS — Z86718 Personal history of other venous thrombosis and embolism: Secondary | ICD-10-CM

## 2021-01-11 DIAGNOSIS — Z981 Arthrodesis status: Secondary | ICD-10-CM | POA: Diagnosis not present

## 2021-01-11 DIAGNOSIS — S22080A Wedge compression fracture of T11-T12 vertebra, initial encounter for closed fracture: Secondary | ICD-10-CM | POA: Diagnosis not present

## 2021-01-11 DIAGNOSIS — I5022 Chronic systolic (congestive) heart failure: Secondary | ICD-10-CM | POA: Diagnosis present

## 2021-01-11 DIAGNOSIS — Z96653 Presence of artificial knee joint, bilateral: Secondary | ICD-10-CM | POA: Diagnosis present

## 2021-01-11 DIAGNOSIS — Z66 Do not resuscitate: Secondary | ICD-10-CM | POA: Diagnosis present

## 2021-01-11 DIAGNOSIS — Z7401 Bed confinement status: Secondary | ICD-10-CM | POA: Diagnosis not present

## 2021-01-11 DIAGNOSIS — Z85820 Personal history of malignant melanoma of skin: Secondary | ICD-10-CM | POA: Diagnosis not present

## 2021-01-11 DIAGNOSIS — R0902 Hypoxemia: Secondary | ICD-10-CM

## 2021-01-11 DIAGNOSIS — N182 Chronic kidney disease, stage 2 (mild): Secondary | ICD-10-CM | POA: Diagnosis present

## 2021-01-11 DIAGNOSIS — T502X5A Adverse effect of carbonic-anhydrase inhibitors, benzothiadiazides and other diuretics, initial encounter: Secondary | ICD-10-CM | POA: Diagnosis present

## 2021-01-11 DIAGNOSIS — M545 Low back pain, unspecified: Secondary | ICD-10-CM | POA: Diagnosis present

## 2021-01-11 DIAGNOSIS — U071 COVID-19: Principal | ICD-10-CM | POA: Diagnosis present

## 2021-01-11 DIAGNOSIS — G629 Polyneuropathy, unspecified: Secondary | ICD-10-CM | POA: Diagnosis present

## 2021-01-11 DIAGNOSIS — I10 Essential (primary) hypertension: Secondary | ICD-10-CM | POA: Diagnosis present

## 2021-01-11 DIAGNOSIS — G894 Chronic pain syndrome: Secondary | ICD-10-CM | POA: Diagnosis present

## 2021-01-11 DIAGNOSIS — Z791 Long term (current) use of non-steroidal anti-inflammatories (NSAID): Secondary | ICD-10-CM

## 2021-01-11 DIAGNOSIS — K5900 Constipation, unspecified: Secondary | ICD-10-CM | POA: Diagnosis present

## 2021-01-11 DIAGNOSIS — I130 Hypertensive heart and chronic kidney disease with heart failure and stage 1 through stage 4 chronic kidney disease, or unspecified chronic kidney disease: Secondary | ICD-10-CM | POA: Diagnosis present

## 2021-01-11 DIAGNOSIS — I5032 Chronic diastolic (congestive) heart failure: Secondary | ICD-10-CM | POA: Diagnosis present

## 2021-01-11 DIAGNOSIS — R531 Weakness: Secondary | ICD-10-CM | POA: Diagnosis not present

## 2021-01-11 DIAGNOSIS — E871 Hypo-osmolality and hyponatremia: Secondary | ICD-10-CM | POA: Diagnosis present

## 2021-01-11 DIAGNOSIS — D72829 Elevated white blood cell count, unspecified: Secondary | ICD-10-CM | POA: Diagnosis present

## 2021-01-11 DIAGNOSIS — I5023 Acute on chronic systolic (congestive) heart failure: Secondary | ICD-10-CM | POA: Diagnosis present

## 2021-01-11 DIAGNOSIS — E785 Hyperlipidemia, unspecified: Secondary | ICD-10-CM | POA: Diagnosis present

## 2021-01-11 DIAGNOSIS — E861 Hypovolemia: Secondary | ICD-10-CM | POA: Diagnosis present

## 2021-01-11 DIAGNOSIS — Z79891 Long term (current) use of opiate analgesic: Secondary | ICD-10-CM

## 2021-01-11 DIAGNOSIS — Z87891 Personal history of nicotine dependence: Secondary | ICD-10-CM

## 2021-01-11 DIAGNOSIS — Y92009 Unspecified place in unspecified non-institutional (private) residence as the place of occurrence of the external cause: Secondary | ICD-10-CM

## 2021-01-11 DIAGNOSIS — Z8249 Family history of ischemic heart disease and other diseases of the circulatory system: Secondary | ICD-10-CM

## 2021-01-11 DIAGNOSIS — E860 Dehydration: Secondary | ICD-10-CM | POA: Diagnosis present

## 2021-01-11 LAB — CBC WITH DIFFERENTIAL/PLATELET
Abs Immature Granulocytes: 0.07 10*3/uL (ref 0.00–0.07)
Basophils Absolute: 0 10*3/uL (ref 0.0–0.1)
Basophils Relative: 0 %
Eosinophils Absolute: 0 10*3/uL (ref 0.0–0.5)
Eosinophils Relative: 0 %
HCT: 38 % — ABNORMAL LOW (ref 39.0–52.0)
Hemoglobin: 12.4 g/dL — ABNORMAL LOW (ref 13.0–17.0)
Immature Granulocytes: 1 %
Lymphocytes Relative: 17 %
Lymphs Abs: 2.1 10*3/uL (ref 0.7–4.0)
MCH: 27.2 pg (ref 26.0–34.0)
MCHC: 32.6 g/dL (ref 30.0–36.0)
MCV: 83.3 fL (ref 80.0–100.0)
Monocytes Absolute: 0.9 10*3/uL (ref 0.1–1.0)
Monocytes Relative: 8 %
Neutro Abs: 9 10*3/uL — ABNORMAL HIGH (ref 1.7–7.7)
Neutrophils Relative %: 74 %
Platelets: 508 10*3/uL — ABNORMAL HIGH (ref 150–400)
RBC: 4.56 MIL/uL (ref 4.22–5.81)
RDW: 14.2 % (ref 11.5–15.5)
WBC: 12.2 10*3/uL — ABNORMAL HIGH (ref 4.0–10.5)
nRBC: 0 % (ref 0.0–0.2)

## 2021-01-11 LAB — URINALYSIS, ROUTINE W REFLEX MICROSCOPIC
Bacteria, UA: NONE SEEN
Bilirubin Urine: NEGATIVE
Glucose, UA: NEGATIVE mg/dL
Ketones, ur: NEGATIVE mg/dL
Leukocytes,Ua: NEGATIVE
Nitrite: NEGATIVE
Protein, ur: NEGATIVE mg/dL
Specific Gravity, Urine: 1.008 (ref 1.005–1.030)
pH: 6 (ref 5.0–8.0)

## 2021-01-11 LAB — COMPREHENSIVE METABOLIC PANEL
ALT: 18 U/L (ref 0–44)
AST: 36 U/L (ref 15–41)
Albumin: 3.1 g/dL — ABNORMAL LOW (ref 3.5–5.0)
Alkaline Phosphatase: 101 U/L (ref 38–126)
Anion gap: 11 (ref 5–15)
BUN: 21 mg/dL (ref 8–23)
CO2: 24 mmol/L (ref 22–32)
Calcium: 8.9 mg/dL (ref 8.9–10.3)
Chloride: 91 mmol/L — ABNORMAL LOW (ref 98–111)
Creatinine, Ser: 1.54 mg/dL — ABNORMAL HIGH (ref 0.61–1.24)
GFR, Estimated: 43 mL/min — ABNORMAL LOW (ref >60)
Glucose, Bld: 123 mg/dL — ABNORMAL HIGH (ref 70–99)
Potassium: 3.7 mmol/L (ref 3.5–5.1)
Sodium: 126 mmol/L — ABNORMAL LOW (ref 135–145)
Total Bilirubin: 1.1 mg/dL (ref 0.3–1.2)
Total Protein: 6.3 g/dL — ABNORMAL LOW (ref 6.5–8.1)

## 2021-01-11 LAB — BASIC METABOLIC PANEL
Anion gap: 13 (ref 5–15)
BUN: 18 mg/dL (ref 8–23)
CO2: 26 mmol/L (ref 22–32)
Calcium: 8.3 mg/dL — ABNORMAL LOW (ref 8.9–10.3)
Chloride: 90 mmol/L — ABNORMAL LOW (ref 98–111)
Creatinine, Ser: 1.31 mg/dL — ABNORMAL HIGH (ref 0.61–1.24)
GFR, Estimated: 53 mL/min — ABNORMAL LOW (ref >60)
Glucose, Bld: 114 mg/dL — ABNORMAL HIGH (ref 70–99)
Potassium: 3.6 mmol/L (ref 3.5–5.1)
Sodium: 129 mmol/L — ABNORMAL LOW (ref 135–145)

## 2021-01-11 LAB — RESP PANEL BY RT-PCR (FLU A&B, COVID) ARPGX2
Influenza A by PCR: NEGATIVE
Influenza B by PCR: NEGATIVE
SARS Coronavirus 2 by RT PCR: POSITIVE — AB

## 2021-01-11 LAB — OSMOLALITY: Osmolality: 272 mOsm/kg — ABNORMAL LOW (ref 275–295)

## 2021-01-11 LAB — OSMOLALITY, URINE: Osmolality, Ur: 338 mOsm/kg (ref 300–900)

## 2021-01-11 LAB — SODIUM, URINE, RANDOM: Sodium, Ur: 73 mmol/L

## 2021-01-11 MED ORDER — ORAL CARE MOUTH RINSE
15.0000 mL | Freq: Two times a day (BID) | OROMUCOSAL | Status: DC
  Administered 2021-01-12 – 2021-01-17 (×9): 15 mL via OROMUCOSAL

## 2021-01-11 MED ORDER — SODIUM CHLORIDE 0.9 % IV BOLUS
500.0000 mL | Freq: Once | INTRAVENOUS | Status: AC
  Administered 2021-01-11: 500 mL via INTRAVENOUS

## 2021-01-11 MED ORDER — SODIUM CHLORIDE 0.9 % IV SOLN: INTRAVENOUS | Status: DC

## 2021-01-11 MED ORDER — ENOXAPARIN SODIUM 40 MG/0.4ML IJ SOSY
40.0000 mg | PREFILLED_SYRINGE | INTRAMUSCULAR | Status: DC
  Administered 2021-01-11 – 2021-01-17 (×7): 40 mg via SUBCUTANEOUS
  Filled 2021-01-11 (×7): qty 0.4

## 2021-01-11 MED ORDER — AMITRIPTYLINE HCL 50 MG PO TABS
50.0000 mg | ORAL_TABLET | Freq: Every day | ORAL | Status: DC
  Administered 2021-01-11 – 2021-01-17 (×7): 50 mg via ORAL
  Filled 2021-01-11: qty 2
  Filled 2021-01-11 (×5): qty 1
  Filled 2021-01-11 (×4): qty 2
  Filled 2021-01-11 (×2): qty 1

## 2021-01-11 MED ORDER — ONDANSETRON HCL 4 MG/2ML IJ SOLN
4.0000 mg | Freq: Once | INTRAMUSCULAR | Status: AC
  Administered 2021-01-11: 4 mg via INTRAVENOUS
  Filled 2021-01-11: qty 2

## 2021-01-11 MED ORDER — CARVEDILOL 12.5 MG PO TABS
12.5000 mg | ORAL_TABLET | Freq: Two times a day (BID) | ORAL | Status: DC
  Administered 2021-01-11 – 2021-01-13 (×4): 12.5 mg via ORAL
  Filled 2021-01-11 (×2): qty 1
  Filled 2021-01-11: qty 4
  Filled 2021-01-11: qty 1

## 2021-01-11 MED ORDER — CELECOXIB 200 MG PO CAPS
200.0000 mg | ORAL_CAPSULE | Freq: Two times a day (BID) | ORAL | Status: DC
  Administered 2021-01-11 – 2021-01-17 (×13): 200 mg via ORAL
  Filled 2021-01-11 (×13): qty 1

## 2021-01-11 MED ORDER — SPIRONOLACTONE 25 MG PO TABS
25.0000 mg | ORAL_TABLET | Freq: Every day | ORAL | Status: DC
  Administered 2021-01-11 – 2021-01-13 (×3): 25 mg via ORAL
  Filled 2021-01-11 (×3): qty 1

## 2021-01-11 MED ORDER — HYDRALAZINE HCL 25 MG PO TABS
25.0000 mg | ORAL_TABLET | Freq: Four times a day (QID) | ORAL | Status: DC | PRN
  Administered 2021-01-14: 25 mg via ORAL
  Filled 2021-01-11: qty 1

## 2021-01-11 MED ORDER — OXYCODONE HCL 5 MG PO TABS
10.0000 mg | ORAL_TABLET | Freq: Four times a day (QID) | ORAL | Status: DC
Start: 2021-01-11 — End: 2021-01-14
  Administered 2021-01-11 – 2021-01-13 (×9): 10 mg via ORAL
  Filled 2021-01-11 (×9): qty 2

## 2021-01-11 MED ORDER — MAGNESIUM CITRATE PO SOLN
0.5000 | Freq: Every day | ORAL | Status: DC | PRN
  Filled 2021-01-11: qty 296

## 2021-01-11 MED ORDER — ONDANSETRON 4 MG PO TBDP
8.0000 mg | ORAL_TABLET | Freq: Three times a day (TID) | ORAL | Status: DC | PRN
  Administered 2021-01-11 – 2021-01-15 (×4): 8 mg via ORAL
  Filled 2021-01-11 (×4): qty 2

## 2021-01-11 MED ORDER — AMLODIPINE BESYLATE 5 MG PO TABS
5.0000 mg | ORAL_TABLET | Freq: Every day | ORAL | Status: DC
  Administered 2021-01-11 – 2021-01-12 (×2): 5 mg via ORAL
  Filled 2021-01-11 (×2): qty 1

## 2021-01-11 MED ORDER — TAMSULOSIN HCL 0.4 MG PO CAPS
0.4000 mg | ORAL_CAPSULE | Freq: Every day | ORAL | Status: DC
  Administered 2021-01-11 – 2021-01-17 (×7): 0.4 mg via ORAL
  Filled 2021-01-11 (×7): qty 1

## 2021-01-11 MED ORDER — FLUTICASONE PROPIONATE 50 MCG/ACT NA SUSP
1.0000 | Freq: Every day | NASAL | Status: DC | PRN
  Administered 2021-01-17: 1 via NASAL
  Filled 2021-01-11 (×2): qty 16

## 2021-01-11 NOTE — Plan of Care (Signed)

## 2021-01-11 NOTE — ED Notes (Signed)
Help get patient undressed on the monitor patient is resting with call bell in reach

## 2021-01-11 NOTE — ED Notes (Signed)
Assisted pt with using the urinal. Pt stated he was done peeing but he still had a steady stream flowing. He stated he did not feel the stream still proceeding.

## 2021-01-11 NOTE — H&P (Signed)
History and Physical    [name] UXN:[id number] DOB: 12/31/1931 DOA: 01/11/2021  PCP: Hoyt Koch, MD (Confirm with patient/family/NH records and if not entered, this has to be entered at [facility] point of entry) Patient coming from: Home  I have personally briefly reviewed patient's old medical records in [place]  Chief Complaint: Falls  HPI: [name] is a 87 y.o. male with medical history significant of chronic back pain, chronic peripheral neuropathy, chronic T-spine and lumbar spine fracture status post kyphoplasty of T12, L1-3, HTN, chronic diastolic CHF, BPH, presented with worsening of weakness and frequent falls.  Patient has baseline ambulation dysfunction, uses walker to ambulate.  However for the last 6 weeks or so, patient's mobilization has significantly reduced due to worsening of his back pain and neuropathy.  Suspect pain is worse when standing up and walk and relieved by lying flat.  As a result, for last 6+ weeks, patient has largely remained bedbound. "Tried all the medications, but nothing worked" And he only take oxycodone for his back pain.  Recently, 3 weeks ago, he enrolled himself in "ALIGN" program with laser beam treatment of the his neuropathy with some success.  Patient has BPH, and once a while, he will take Lasix for urination.  This morning, patient felt bladder is full, he took 80 mg of Lasix and then went to bathroom.  When he trying to stand up, he felt lightheaded and blurry vision and then fell down backward.  He then supported himself up and soon he had another episode of feeling lightheaded and then fell backward.  Denied any head injury, no loss of consciousness. ED Course: Positive for orthostatic vital signs.  CT head negative.  CT thoracic and lumbar spine showed progress of chronic T11 endplate fracture.  Review of Systems: As per HPI otherwise 14 point review of systems negative.    Past Medical History:  Diagnosis Date  .  ACTINIC KERATOSIS, HEAD 03/23/2008  . ALLERGIC RHINITIS 03/27/2007  . Arthritis   . BENIGN PROSTATIC HYPERTROPHY, WITH URINARY OBSTRUCTION 10/16/2007  . CAD 08/18/2007   "patient denies any issues with heart, does not see cardiologist"  . Carpal tunnel syndrome, bilateral 09/04/2016  . CARPAL TUNNEL SYNDROME, LEFT 04/13/2008  . CHF (congestive heart failure) ([place])   . Chronic back pain   . Chronic neck pain   . Complication of anesthesia    left lower jaw teeth have been injured in the past and he has lost those, now has 2 loose front teeth due to a mouth injury on 01/02/20  . DVT (deep venous thrombosis) ([place])   . ERECTILE DYSFUNCTION, ORGANIC 02/20/2010  . Gout, unspecified 02/20/2010  . Headache(784.0)    migraines hx of- started at age 18- none since 58  . History of melanoma   . HYPERLIPIDEMIA, WITH HIGH HDL 10/18/2008  . HYPERTENSION 05/09/2007   sees Dr. Benay Pillow  . KNEE PAIN 10/10/2009  . LOW BACK PAIN 03/27/2007  . NEOPLASM, MALIGNANT, SKIN, TRUNK 05/09/2007   squamous cell on scalp, sideburn and ear (right). meyloma on truckl.  . Patellar tendinitis 10/10/2009  . Sciatic nerve pain    on Oxycodone as needed  . SKIN CANCER, HX OF 03/27/2007  . UNS ADVRS EFF UNS RX MEDICINAL&BIOLOGICAL SBSTNC 08/18/2007  . Urinary urgency     Past Surgical History:  Procedure Laterality Date  . APPENDECTOMY  1969  . APPLICATION OF WOUND VAC Left 01/19/2020   Procedure: Application Of Wound Vac;  Surgeon: [name], MD;  Location: [place];  Service: Orthopedics;  Laterality: Left;  . BLEPHAROPLASTY    . CARPAL TUNNEL RELEASE Right   . CHOLECYSTECTOMY    . ESOPHAGOGASTRODUODENOSCOPY (EGD) WITH PROPOFOL N/A 04/26/2015   Procedure: ESOPHAGOGASTRODUODENOSCOPY (EGD) WITH PROPOFOL;  Surgeon: Inda Castle, MD;  Location: [facility];  Service: Endoscopy;  Laterality: N/A;  . EYE SURGERY     bilateral cataracts removed and vitrectomies  . JOINT REPLACEMENT Right    knee  . KNEE ARTHROSCOPY    .  KYPHOPLASTY N/A 06/10/2020   Procedure: Thoracic Twelve, Lumbar One, Lumbar Two Kyphoplasty;  Surgeon: Ashok Pall, MD;  Location: [place];  Service: Neurosurgery;  Laterality: N/A;  3C/RM 21  . KYPHOPLASTY N/A 09/07/2020   Procedure: Lumbar three KYPHOPLASTY;  Surgeon: Ashok Pall, MD;  Location: [place];  Service: Neurosurgery;  Laterality: N/A;  . LAMINECTOMY    . LATERAL FUSION LUMBAR SPINE, TRANSVERSE    . LUMBAR LAMINECTOMY/DECOMPRESSION MICRODISCECTOMY Left 11/03/2012   Procedure: LUMBAR LAMINECTOMY/DECOMPRESSION MICRODISCECTOMY 1 LEVEL;  Surgeon: Hosie Spangle, MD;  Location: [place] [facility];  Service: Neurosurgery;  Laterality: Left;  LEFT L5S1 laminotomy foraminotomy and possible microdiskectomy  . MELANOMA EXCISION     scalp  . NASAL SINUS SURGERY    . ORIF PATELLA Left 01/19/2020   Procedure: LEFT OPEN REDUCTION INTERNAL (ORIF) FIXATION PATELLA WITH AUTO GRAFTING USING HAMSTRING REINFORCEMENT;  Surgeon: [name], MD;  Location: [place];  Service: Orthopedics;  Laterality: Left;  . POSTERIOR CERVICAL FUSION/FORAMINOTOMY Right 10/01/2012   Procedure: POSTERIOR CERVICAL FUSION/FORAMINOTOMY LEVEL 1;  Surgeon: Hosie Spangle, MD;  Location: [place] [facility];  Service: Neurosurgery;  Laterality: Right;  Right Cervical seven thoracic one Cervical laminectomy/foraminotomy with posterior cervical arthrodesis   . POSTERIOR LUMBAR FUSION N/A 05/23/2013   Procedure: exploration of lumbar wound, explantation of left interbody implant.;  Surgeon: Hosie Spangle, MD;  Location: [place];  Service: Neurosurgery;  Laterality: N/A;  . RECTAL SURGERY     Fissure  . TOTAL KNEE ARTHROPLASTY Left 05/07/2019   Procedure: LEFT TOTAL KNEE ARTHROPLASTY;  Surgeon: [name], MD;  Location: [place];  Service: Orthopedics;  Laterality: Left;  Marland Kitchen VITRECTOMY     right and left 2013     reports that he quit smoking about 39 years ago. He has a 33.00 pack-year smoking history. He has never used  smokeless tobacco. He reports previous alcohol use. He reports that he does not use drugs.  Allergies  Allergen Reactions  . Lyrica [Pregabalin] Swelling    Taking sometime as needed now  . Gabapentin Other (See Comments)    Lower extremity edema    Family History  Problem Relation Age of Onset  . Dementia Mother   . Anemia Father   . Heart disease Father   . Stroke Sister        ICH  . Lung disease Sister      Prior to Admission medications   Medication Sig Start Date End Date Taking? Authorizing Provider  amitriptyline (ELAVIL) 50 MG tablet TAKE 1 TABLET BY MOUTH EVERYDAY AT BEDTIME Patient taking differently: Take 50 mg by mouth at bedtime. 12/21/20  Yes Hoyt Koch, MD  amLODipine (NORVASC) 10 MG tablet TAKE 1 TABLET BY MOUTH EVERY DAY Patient taking differently: Take 10 mg by mouth daily. 10/24/20  Yes Hoyt Koch, MD  carvedilol (COREG) 25 MG tablet TAKE 1 TABLET BY MOUTH 2 TIMES DAILY WITH A MEAL  Patient taking differently: Take 25 mg by mouth 2 (two) times daily with a meal. 07/12/20  Yes Hoyt Koch, MD  celecoxib (CELEBREX) 200 MG capsule TAKE 1 CAPSULE BY MOUTH TWICE A DAY Patient taking differently: Take 200 mg by mouth 2 (two) times daily. 01/06/21  Yes Hoyt Koch, MD  fluticasone [facility]) 50 MCG/ACT nasal spray Place 1 spray into both nostrils daily as needed for allergies.   Yes [provider]  furosemide (LASIX) 80 MG tablet TAKE 1 TABLET BY MOUTH EVERY DAY Patient taking differently: Take 80 mg by mouth daily. 01/06/21  Yes Hoyt Koch, MD  hydrALAZINE (APRESOLINE) 50 MG tablet TAKE 1 TABLET BY MOUTH TWICE A DAY Patient taking differently: Take 50 mg by mouth in the morning and at bedtime. 08/23/20  Yes Hoyt Koch, MD  magnesium citrate SOLN Take 0.5 Bottles by mouth daily as needed for moderate constipation.   Yes [provider]  ondansetron (ZOFRAN-ODT) 8 MG disintegrating tablet TAKE  1 TABLET (8 MG TOTAL) BY MOUTH EVERY 8 (EIGHT) HOURS AS NEEDED FOR NAUSEA OR VOMITING. 07/05/20  Yes Hoyt Koch, MD  Oxycodone HCl 10 MG TABS Take 1 tablet (10 mg total) by mouth every 4 (four) hours as needed (pain score 4-6). Patient taking differently: Take 10 mg by mouth 4 (four) times daily. 06/01/19  Yes Magnant, Gerrianne Scale, PA-C  spironolactone (ALDACTONE) 25 MG tablet TAKE 2 TABLETS BY MOUTH EVERY DAY Patient taking differently: Take 50 mg by mouth 2 (two) times daily. 08/15/20  Yes Hoyt Koch, MD  tamsulosin (FLOMAX) 0.4 MG CAPS capsule TAKE 1 CAPSULE BY MOUTH EVERY DAY Patient taking differently: Take 0.4 mg by mouth daily. 01/05/21  Yes Hoyt Koch, MD  predniSONE (DELTASONE) 20 MG tablet Take 2 tablets (40 mg total) by mouth daily with breakfast. Patient not taking: No sig reported 11/29/20   Hoyt Koch, MD  sulfamethoxazole-trimethoprim (BACTRIM DS) 800-160 MG tablet Take 1 tablet by mouth daily. Patient not taking: No sig reported 11/17/20   Hoyt Koch, MD    Physical Exam: Vitals:   01/11/21 1456 01/11/21 1459 01/11/21 1500 01/11/21 1530  BP: 129/69  134/74 110/62  Pulse: 95 98 93 78  Resp: 14 (!) 22 18 17   Temp:      TempSrc:      SpO2: 94% 94% 93% 94%    Constitutional: NAD, calm, comfortable Vitals:   01/11/21 1456 01/11/21 1459 01/11/21 1500 01/11/21 1530  BP: 129/69  134/74 110/62  Pulse: 95 98 93 78  Resp: 14 (!) 22 18 17   Temp:      TempSrc:      SpO2: 94% 94% 93% 94%   Eyes: PERRL, lids and conjunctivae normal ENMT: Mucous membranes are dry. Posterior pharynx clear of any exudate or lesions.Normal dentition.  Neck: normal, supple, no masses, no thyromegaly Respiratory: clear to auscultation bilaterally, no wheezing, no crackles. Normal respiratory effort. No accessory muscle use.  Cardiovascular: Regular rate and rhythm, no murmurs / rubs / gallops. No extremity edema. 2+ pedal pulses. No carotid bruits.   Abdomen: no tenderness, no masses palpated. No hepatosplenomegaly. Bowel sounds positive.  Musculoskeletal: no clubbing / cyanosis. No joint deformity upper and lower extremities.  Severe tenderness on upper back. Normal muscle tone.  Skin: no rashes, lesions, ulcers. No induration Neurologic: CN 2-12 grossly intact. Sensation intact, DTR normal. Strength 5/5 in all 4.  Psychiatric: Normal judgment and insight. Alert and oriented x 3.  Normal mood.     Labs on Admission: I have personally reviewed following labs and imaging studies  CBC: Recent Labs  Lab 01/11/21 1241  WBC 12.2*  NEUTROABS 9.0*  HGB 12.4*  HCT 38.0*  MCV 83.3  PLT 295*   Basic Metabolic Panel: Recent Labs  Lab 01/11/21 1241  NA 126*  K 3.7  CL 91*  CO2 24  GLUCOSE 123*  BUN 21  CREATININE 1.54*  CALCIUM 8.9   GFR: CrCl cannot be calculated (Unknown ideal weight.). Liver Function Tests: Recent Labs  Lab 01/11/21 1241  AST 36  ALT 18  ALKPHOS 101  BILITOT 1.1  PROT 6.3*  ALBUMIN 3.1*   No results for input(s): LIPASE, AMYLASE in the last 168 hours. No results for input(s): AMMONIA in the last 168 hours. Coagulation Profile: No results for input(s): INR, PROTIME in the last 168 hours. Cardiac Enzymes: No results for input(s): CKTOTAL, CKMB, CKMBINDEX, TROPONINI in the last 168 hours. BNP (last 3 results) Recent Labs    03/29/20 1113 07/29/20 1408 10/06/20 1056  PROBNP 175.0* 222.0* 212.0*   HbA1C: No results for input(s): HGBA1C in the last 72 hours. CBG: No results for input(s): GLUCAP in the last 168 hours. Lipid Profile: No results for input(s): CHOL, HDL, LDLCALC, TRIG, CHOLHDL, LDLDIRECT in the last 72 hours. Thyroid Function Tests: No results for input(s): TSH, T4TOTAL, FREET4, T3FREE, THYROIDAB in the last 72 hours. Anemia Panel: No results for input(s): VITAMINB12, FOLATE, FERRITIN, TIBC, IRON, RETICCTPCT in the last 72 hours. Urine analysis:    Component Value Date/Time    COLORURINE YELLOW 01/11/2021 [place] 01/11/2021 1242   LABSPEC 1.008 01/11/2021 1242   PHURINE 6.0 01/11/2021 1242   GLUCOSEU NEGATIVE 01/11/2021 1242   GLUCOSEU NEGATIVE 10/03/2018 1036   HGBUR SMALL (A) 01/11/2021 1242   BILIRUBINUR NEGATIVE 01/11/2021 1242   BILIRUBINUR negative 10/17/2018 [place] 01/11/2021 1242   PROTEINUR NEGATIVE 01/11/2021 1242   UROBILINOGEN 0.2 10/17/2018 1555   UROBILINOGEN 0.2 10/03/2018 1036   NITRITE NEGATIVE 01/11/2021 1242   LEUKOCYTESUR NEGATIVE 01/11/2021 1242    Radiological Exams on Admission: DG Chest 1 View  Result Date: 01/11/2021 CLINICAL DATA:  Fall.  Abdominal pain.  Back pain. EXAM: CHEST  1 VIEW COMPARISON:  One-view chest x-ray 08/08/2020 FINDINGS: Heart size normal. Aortic atherosclerosis noted. Lung volumes are low. Lungs are clear. IMPRESSION: 1. Low lung volumes. 2. No acute cardiopulmonary disease. 3. Aortic atherosclerosis. Electronically Signed   By: San Morelle M.D.   On: 01/11/2021 13:30   DG Thoracic Spine 2 View  Result Date: 01/11/2021 CLINICAL DATA:  Back pain.  Fall. EXAM: THORACIC SPINE 2 VIEWS COMPARISON:  Thoracic spine radiographs 11/21/2020 and thoracolumbar radiograph FINDINGS: T11 superior endplate compression fracture is new from November and appears to have progressed from April. No additional thoracic fractures are present. Multilevel endplate degenerative changes noted. Aortic atherosclerosis present. Thoracic kyphosis stable. IMPRESSION: 1. Probable progression of T11 superior endplate compression fracture since April. MRI of the thoracic spine or lumbar spine could be used for further evaluation. Both are not necessary. 2. No additional thoracic fractures. 3. Aortic atherosclerosis. Electronically Signed   By: San Morelle M.D.   On: 01/11/2021 13:29   DG Lumbar Spine Complete  Result Date: 01/11/2021 CLINICAL DATA:  Fall.  Back pain. EXAM: LUMBAR SPINE - COMPLETE  4+ VIEW COMPARISON:  09/07/2020. FINDINGS: Lumbar spine numbered the lowest segmented appearing lumbar shaped on lateral view vertebra as  L5. L5-S1 posterior fusion. T12, L1, L2, L3 vertebroplasties again noted. Diffuse multilevel degenerative change. Stable mild anterolisthesis L5 on S1. No acute bony abnormality. Aortoiliac and visceral atherosclerotic vascular changes. IMPRESSION: Postsurgical changes lumbar spine as above. Diffuse osteopenia degenerative change. Mild stable L5 on S1 anterolisthesis. No acute bony abnormality. 2.  Aortoiliac atherosclerotic vascular disease. Electronically Signed   By: Marcello Moores  Register   On: 01/11/2021 13:33   CT Head Wo Contrast  Result Date: 01/11/2021 CLINICAL DATA:  Fall EXAM: CT HEAD WITHOUT CONTRAST TECHNIQUE: Contiguous axial images were obtained from the base of the skull through the vertex without intravenous contrast. COMPARISON:  07/13/2020 FINDINGS: Brain: There is no acute intracranial hemorrhage, mass effect, or edema. Gray-white differentiation is preserved. There is no extra-axial fluid collection. Prominence of the ventricles and sulci reflects stable parenchymal volume loss. Patchy hypoattenuation in the supratentorial white matter is nonspecific but probably reflects similar minor chronic microvascular ischemic changes. Vascular: There is atherosclerotic calcification at the skull base. Skull: Calvarium is unremarkable. Sinuses/Orbits: Pansinus mucosal thickening. Other: No evidence of acute intracranial injury. IMPRESSION: No evidence of acute intracranial injury. Electronically Signed   By: Macy Mis M.D.   On: 01/11/2021 13:42    EKG: Independently reviewed.  Sinus, no acute ST-T changes  Assessment/Plan Active Problems:   Fall at home, initial encounter   Fall  (please populate well all problems here in Problem List. (For example, if patient is on BP meds at home and you resume or decide to hold them, it is a problem that needs to be her.  Same for CAD, COPD, HLD and so on)  Fall and near syncope -Has signs of hypovolemia, probably from overdiuresis of high-dose Lasix. -Hold Lasix, start IV fluids x24 hours.  Check orthostatic vital signs twice daily.  Hyponatremia -Hypovolemic, secondary from overdiuresis. IVF x24 hour, recheck Na level tonight. -May also have underlying SIADH secondary to chronic back pain.  We will check hyponatremia work-up.  HTN with orthostatic hypotension -Cut down amlodipine, cut down Coreg, cut down Aldactone -Switch hydralazine to as needed  Leukocytosis -UA negative for UTI, chest x-ray negative for acute infiltrates.  Suspect this is reactive, will monitor off antibiotics for now.  Chronic T11 fracture with chronic ambulation dysfunction -Outpatient pain management -PT evaluation.  BPH -Continue Flowmax.  CKD stage II -Cre level stable. On IVF.  DVT prophylaxis: Heparin subcu code Status: DNR Family Communication: None at bedside Disposition Plan: Expect more than 2 midnight hospital stay to correct volume status and sodium level Consults called: None Admission status: Tele admit   Lequita Halt MD [facility] Pager [phone]  01/11/2021, 4:03 PM

## 2021-01-11 NOTE — ED Notes (Signed)
PO meds administered and pt vomited just after taking all PO meds.

## 2021-01-11 NOTE — ED Triage Notes (Signed)
Pt BIB EMS from home due to 6 falls since last night. Pt reports stomach pain & back pain.Pt is vomiting in triage.

## 2021-01-11 NOTE — ED Notes (Signed)
Pt states " I'm unable to stand".

## 2021-01-11 NOTE — ED Notes (Signed)
Ok to speak with Son- Ronalee Belts - who is MPOA .

## 2021-01-11 NOTE — ED Provider Notes (Signed)
[facility] EMERGENCY DEPARTMENT Provider Note   CSN: [id number] Arrival date & time: 01/11/21  1219     History Chief Complaint  Patient presents with  . Fall    [name] is a 87 y.o. male.  Patient with history of DVT, heart failure on furosemide, lumbar fusion, hypertension --presents the emergency department for frequent falls, and on arrival vomiting.  Patient states that he falls frequently, typically 1-2 times a week.  Typically he will fall backwards.  Over the past day he has had approximately 4 falls including 2 last night and 2 this morning.  He has hit his head.  He also complains of pain in the mid to lower back.  Upon arrival to the emergency department today he developed vomiting.  He denies any recent fevers, URI symptoms, chest pain or shortness of breath.  No constipation, diarrhea, or urinary symptoms.  No skin rashes however he does have several bruises.  The onset of this condition was acute. The course is constant. Aggravating factors: movement. Alleviating factors: none.           Past Medical History:  Diagnosis Date  . ACTINIC KERATOSIS, HEAD 03/23/2008  . ALLERGIC RHINITIS 03/27/2007  . Arthritis   . BENIGN PROSTATIC HYPERTROPHY, WITH URINARY OBSTRUCTION 10/16/2007  . CAD 08/18/2007   "patient denies any issues with heart, does not see cardiologist"  . Carpal tunnel syndrome, bilateral 09/04/2016  . CARPAL TUNNEL SYNDROME, LEFT 04/13/2008  . CHF (congestive heart failure) ([place])   . Chronic back pain   . Chronic neck pain   . Complication of anesthesia    left lower jaw teeth have been injured in the past and he has lost those, now has 2 loose front teeth due to a mouth injury on 01/02/20  . DVT (deep venous thrombosis) ([place])   . ERECTILE DYSFUNCTION, ORGANIC 02/20/2010  . Gout, unspecified 02/20/2010  . Headache(784.0)    migraines hx of- started at age 74- none since 51  . History of melanoma   . HYPERLIPIDEMIA, WITH HIGH HDL  10/18/2008  . HYPERTENSION 05/09/2007   sees Dr. Benay Pillow  . KNEE PAIN 10/10/2009  . LOW BACK PAIN 03/27/2007  . NEOPLASM, MALIGNANT, SKIN, TRUNK 05/09/2007   squamous cell on scalp, sideburn and ear (right). meyloma on truckl.  . Patellar tendinitis 10/10/2009  . Sciatic nerve pain    on Oxycodone as needed  . SKIN CANCER, HX OF 03/27/2007  . UNS ADVRS EFF UNS RX MEDICINAL&BIOLOGICAL SBSTNC 08/18/2007  . Urinary urgency     Patient Active Problem List   Diagnosis Date Noted  . Osteoporosis 10/07/2020  . Lumbar compression fracture ([place]) 09/07/2020  . Cellulitis 08/08/2020  . Acute renal failure ([place]) 08/08/2020  . Greater trochanteric bursitis of both hips 05/04/2020  . Elevated d-dimer 05/04/2020  . Cellulitis of leg, right 02/03/2020  . Left patella fracture 01/19/2020  . Arthritis of left knee 05/07/2019  . Pre-operative clearance 04/28/2019  . Chronic diastolic CHF (congestive heart failure) ([place]) 08/10/2018  . Leg wound, left 03/19/2018  . Right arm pain 11/11/2017  . Primary osteoarthritis of right foot 08/21/2017  . Syncope 02/27/2017  . Arthritis of right ankle 12/18/2016  . Carpal tunnel syndrome, bilateral 09/04/2016  . Leg swelling 08/16/2016  . Hand weakness 07/12/2016  . Degenerative arthritis of left knee 06/28/2016  . Iron deficiency anemia 03/16/2016  . Arthritis of left acromioclavicular joint 01/03/2016  . Rotator cuff disorder 01/03/2016  . Osteoarthritis  of left knee 04/30/2015  . Baker's cyst of knee 04/30/2015  . Esophageal stricture 04/26/2015  . Benign prostatic hyperplasia 02/21/2015  . Constipation 02/21/2015  . Myalgia and myositis 11/18/2014  . Polyarticular arthritis 07/19/2013  . Chronic radicular low back pain 12/15/2012  . History of gout 02/20/2010  . ERECTILE DYSFUNCTION, ORGANIC 02/20/2010  . Hyperlipidemia 10/18/2008  . CAD (coronary artery disease) 08/18/2007  . Essential hypertension 05/09/2007  . ALLERGIC RHINITIS 03/27/2007     Past Surgical History:  Procedure Laterality Date  . APPENDECTOMY  1969  . APPLICATION OF WOUND VAC Left 01/19/2020   Procedure: Application Of Wound Vac;  Surgeon: [name], MD;  Location: [place];  Service: Orthopedics;  Laterality: Left;  . BLEPHAROPLASTY    . CARPAL TUNNEL RELEASE Right   . CHOLECYSTECTOMY    . ESOPHAGOGASTRODUODENOSCOPY (EGD) WITH PROPOFOL N/A 04/26/2015   Procedure: ESOPHAGOGASTRODUODENOSCOPY (EGD) WITH PROPOFOL;  Surgeon: Inda Castle, MD;  Location: [facility];  Service: Endoscopy;  Laterality: N/A;  . EYE SURGERY     bilateral cataracts removed and vitrectomies  . JOINT REPLACEMENT Right    knee  . KNEE ARTHROSCOPY    . KYPHOPLASTY N/A 06/10/2020   Procedure: Thoracic Twelve, Lumbar One, Lumbar Two Kyphoplasty;  Surgeon: Ashok Pall, MD;  Location: [place];  Service: Neurosurgery;  Laterality: N/A;  3C/RM 21  . KYPHOPLASTY N/A 09/07/2020   Procedure: Lumbar three KYPHOPLASTY;  Surgeon: Ashok Pall, MD;  Location: [place];  Service: Neurosurgery;  Laterality: N/A;  . LAMINECTOMY    . LATERAL FUSION LUMBAR SPINE, TRANSVERSE    . LUMBAR LAMINECTOMY/DECOMPRESSION MICRODISCECTOMY Left 11/03/2012   Procedure: LUMBAR LAMINECTOMY/DECOMPRESSION MICRODISCECTOMY 1 LEVEL;  Surgeon: Hosie Spangle, MD;  Location: [place] [facility];  Service: Neurosurgery;  Laterality: Left;  LEFT L5S1 laminotomy foraminotomy and possible microdiskectomy  . MELANOMA EXCISION     scalp  . NASAL SINUS SURGERY    . ORIF PATELLA Left 01/19/2020   Procedure: LEFT OPEN REDUCTION INTERNAL (ORIF) FIXATION PATELLA WITH AUTO GRAFTING USING HAMSTRING REINFORCEMENT;  Surgeon: [name], MD;  Location: [place];  Service: Orthopedics;  Laterality: Left;  . POSTERIOR CERVICAL FUSION/FORAMINOTOMY Right 10/01/2012   Procedure: POSTERIOR CERVICAL FUSION/FORAMINOTOMY LEVEL 1;  Surgeon: Hosie Spangle, MD;  Location: [place] [facility];  Service: Neurosurgery;  Laterality: Right;  Right Cervical  seven thoracic one Cervical laminectomy/foraminotomy with posterior cervical arthrodesis   . POSTERIOR LUMBAR FUSION N/A 05/23/2013   Procedure: exploration of lumbar wound, explantation of left interbody implant.;  Surgeon: Hosie Spangle, MD;  Location: [place];  Service: Neurosurgery;  Laterality: N/A;  . RECTAL SURGERY     Fissure  . TOTAL KNEE ARTHROPLASTY Left 05/07/2019   Procedure: LEFT TOTAL KNEE ARTHROPLASTY;  Surgeon: [name], MD;  Location: [place];  Service: Orthopedics;  Laterality: Left;  Marland Kitchen VITRECTOMY     right and left 2013       Family History  Problem Relation Age of Onset  . Dementia Mother   . Anemia Father   . Heart disease Father   . Stroke Sister        ICH  . Lung disease Sister     Social History   Tobacco Use  . Smoking status: Former Smoker    Packs/day: 1.00    Years: 33.00    Pack years: 33.00    Quit date: 09/29/1981    Years since quitting: 39.3  . Smokeless tobacco: Never Used  Vaping Use  .  Vaping Use: Never used  Substance Use Topics  . Alcohol use: Not Currently    Comment: rare  . Drug use: No    Home Medications Prior to Admission medications   Medication Sig Start Date End Date Taking? Authorizing Provider  amitriptyline (ELAVIL) 50 MG tablet TAKE 1 TABLET BY MOUTH EVERYDAY AT BEDTIME 12/21/20   Hoyt Koch, MD  amLODipine (NORVASC) 10 MG tablet TAKE 1 TABLET BY MOUTH EVERY DAY 10/24/20   Hoyt Koch, MD  carvedilol (COREG) 25 MG tablet TAKE 1 TABLET BY MOUTH 2 TIMES DAILY WITH A MEAL Patient taking differently: Take 25 mg by mouth 2 (two) times daily with a meal. 07/12/20   Hoyt Koch, MD  celecoxib (CELEBREX) 200 MG capsule TAKE 1 CAPSULE BY MOUTH TWICE A DAY 01/06/21   Hoyt Koch, MD  fluticasone (FLONASE) 50 MCG/ACT nasal spray Place 1 spray into both nostrils daily as needed for allergies.    [provider]  furosemide (LASIX) 80 MG tablet TAKE 1 TABLET BY MOUTH EVERY  DAY 01/06/21   Hoyt Koch, MD  hydrALAZINE (APRESOLINE) 50 MG tablet TAKE 1 TABLET BY MOUTH TWICE A DAY Patient taking differently: Take 50 mg by mouth in the morning and at bedtime. 08/23/20   Hoyt Koch, MD  Ketorolac Tromethamine 15.75 MG/SPRAY SOLN Place 1 spray into the nose 3 (three) times daily as needed. 07/25/20   Hoyt Koch, MD  magnesium citrate SOLN Take 0.5 Bottles by mouth daily as needed for moderate constipation.    [provider]  ondansetron (ZOFRAN-ODT) 8 MG disintegrating tablet TAKE 1 TABLET (8 MG TOTAL) BY MOUTH EVERY 8 (EIGHT) HOURS AS NEEDED FOR NAUSEA OR VOMITING. 07/05/20   Hoyt Koch, MD  Oxycodone HCl 10 MG TABS Take 1 tablet (10 mg total) by mouth every 4 (four) hours as needed (pain score 4-6). Patient taking differently: Take 10 mg by mouth 4 (four) times daily. 06/01/19   Magnant, Charles L, PA-C  predniSONE (DELTASONE) 20 MG tablet Take 2 tablets (40 mg total) by mouth daily with breakfast. 11/29/20   Hoyt Koch, MD  spironolactone (ALDACTONE) 25 MG tablet TAKE 2 TABLETS BY MOUTH EVERY DAY Patient taking differently: Take 50 mg by mouth daily. 08/15/20   Hoyt Koch, MD  sulfamethoxazole-trimethoprim (BACTRIM DS) 800-160 MG tablet Take 1 tablet by mouth daily. 11/17/20   Hoyt Koch, MD  tamsulosin (FLOMAX) 0.4 MG CAPS capsule TAKE 1 CAPSULE BY MOUTH EVERY DAY 01/05/21   Hoyt Koch, MD    Allergies    Lyrica [pregabalin] and Gabapentin  Review of Systems   Review of Systems  Constitutional: Negative for fatigue.  HENT: Negative for tinnitus.   Eyes: Negative for photophobia, pain and visual disturbance.  Respiratory: Negative for shortness of breath.   Cardiovascular: Negative for chest pain.  Gastrointestinal: Positive for nausea and vomiting.  Musculoskeletal: Positive for back pain. Negative for gait problem and neck pain.  Skin: Positive for wound.  Neurological:  Negative for dizziness, weakness, light-headedness, numbness and headaches.  Psychiatric/Behavioral: Negative for confusion and decreased concentration.    Physical Exam Updated Vital Signs BP 134/83   Pulse 94   Temp 98.5 F (36.9 C) (Oral)   Resp 20   SpO2 90%   Physical Exam Vitals and nursing note reviewed.  Constitutional:      Appearance: He is well-developed.  HENT:     Head: Normocephalic and atraumatic. No raccoon eyes  or Battle's sign.     Right Ear: Tympanic membrane, ear canal and external ear normal. No hemotympanum.     Left Ear: Tympanic membrane, ear canal and external ear normal. No hemotympanum.     Nose: Nose normal.     Mouth/Throat:     Mouth: Mucous membranes are moist.  Eyes:     General: Lids are normal.     Conjunctiva/sclera: Conjunctivae normal.     Pupils: Pupils are equal, round, and reactive to light.     Comments: No visible hyphema  Cardiovascular:     Rate and Rhythm: Normal rate and regular rhythm.  Pulmonary:     Effort: Pulmonary effort is normal.     Breath sounds: Normal breath sounds.  Abdominal:     Palpations: Abdomen is soft.     Tenderness: There is no abdominal tenderness. There is no guarding or rebound.     Comments: Patient with old ecchymosis on bilateral abdomen.  Musculoskeletal:        General: Tenderness present. Normal range of motion.     Cervical back: Normal range of motion and neck supple. No tenderness or bony tenderness.     Thoracic back: Tenderness and bony tenderness present.     Lumbar back: Tenderness and bony tenderness present.     Comments: Patient winces in pain when I palpate down the mid thoracic spine to the lumbar spine.  Skin:    General: Skin is warm and dry.  Neurological:     Mental Status: He is alert and oriented to person, place, and time.     GCS: GCS eye subscore is 4. GCS verbal subscore is 5. GCS motor subscore is 6.     Cranial Nerves: No cranial nerve deficit.     Sensory: No sensory  deficit.     Coordination: Coordination normal.     Deep Tendon Reflexes: Reflexes are normal and symmetric.     ED Results / Procedures / Treatments   Labs (all labs ordered are listed, but only abnormal results are displayed) Labs Reviewed  CBC WITH DIFFERENTIAL/PLATELET - Abnormal; Notable for the following components:      Result Value   WBC 12.2 (*)    Hemoglobin 12.4 (*)    HCT 38.0 (*)    Platelets 508 (*)    Neutro Abs 9.0 (*)    All other components within normal limits  COMPREHENSIVE METABOLIC PANEL - Abnormal; Notable for the following components:   Sodium 126 (*)    Chloride 91 (*)    Glucose, Bld 123 (*)    Creatinine, Ser 1.54 (*)    Total Protein 6.3 (*)    Albumin 3.1 (*)    GFR, Estimated 43 (*)    All other components within normal limits  URINALYSIS, ROUTINE W REFLEX MICROSCOPIC - Abnormal; Notable for the following components:   Hgb urine dipstick SMALL (*)    All other components within normal limits  RESP PANEL BY RT-PCR (FLU A&B, COVID) [id number]    ED ECG REPORT   Date: 01/11/2021  Rate: 96  Rhythm: normal sinus rhythm  QRS Axis: normal  Intervals: normal  ST/T Wave abnormalities: normal  Conduction Disutrbances:none  Narrative Interpretation:   Old EKG Reviewed: unchanged  I have personally reviewed the EKG tracing and agree with the computerized printout as noted.  Radiology DG Chest 1 View  Result Date: 01/11/2021 CLINICAL DATA:  Fall.  Abdominal pain.  Back pain. EXAM: CHEST  1 VIEW COMPARISON:  One-view chest x-ray 08/08/2020 FINDINGS: Heart size normal. Aortic atherosclerosis noted. Lung volumes are low. Lungs are clear. IMPRESSION: 1. Low lung volumes. 2. No acute cardiopulmonary disease. 3. Aortic atherosclerosis. Electronically Signed   By: San Morelle M.D.   On: 01/11/2021 13:30   DG Thoracic Spine 2 View  Result Date: 01/11/2021 CLINICAL DATA:  Back pain.  Fall. EXAM: THORACIC SPINE 2 VIEWS COMPARISON:  Thoracic spine  radiographs 11/21/2020 and thoracolumbar radiograph FINDINGS: T11 superior endplate compression fracture is new from November and appears to have progressed from April. No additional thoracic fractures are present. Multilevel endplate degenerative changes noted. Aortic atherosclerosis present. Thoracic kyphosis stable. IMPRESSION: 1. Probable progression of T11 superior endplate compression fracture since April. MRI of the thoracic spine or lumbar spine could be used for further evaluation. Both are not necessary. 2. No additional thoracic fractures. 3. Aortic atherosclerosis. Electronically Signed   By: San Morelle M.D.   On: 01/11/2021 13:29   DG Lumbar Spine Complete  Result Date: 01/11/2021 CLINICAL DATA:  Fall.  Back pain. EXAM: LUMBAR SPINE - COMPLETE 4+ VIEW COMPARISON:  09/07/2020. FINDINGS: Lumbar spine numbered the lowest segmented appearing lumbar shaped on lateral view vertebra as L5. L5-S1 posterior fusion. T12, L1, L2, L3 vertebroplasties again noted. Diffuse multilevel degenerative change. Stable mild anterolisthesis L5 on S1. No acute bony abnormality. Aortoiliac and visceral atherosclerotic vascular changes. IMPRESSION: Postsurgical changes lumbar spine as above. Diffuse osteopenia degenerative change. Mild stable L5 on S1 anterolisthesis. No acute bony abnormality. 2.  Aortoiliac atherosclerotic vascular disease. Electronically Signed   By: Marcello Moores  Register   On: 01/11/2021 13:33   CT Head Wo Contrast  Result Date: 01/11/2021 CLINICAL DATA:  Fall EXAM: CT HEAD WITHOUT CONTRAST TECHNIQUE: Contiguous axial images were obtained from the base of the skull through the vertex without intravenous contrast. COMPARISON:  07/13/2020 FINDINGS: Brain: There is no acute intracranial hemorrhage, mass effect, or edema. Gray-white differentiation is preserved. There is no extra-axial fluid collection. Prominence of the ventricles and sulci reflects stable parenchymal volume loss. Patchy  hypoattenuation in the supratentorial white matter is nonspecific but probably reflects similar minor chronic microvascular ischemic changes. Vascular: There is atherosclerotic calcification at the skull base. Skull: Calvarium is unremarkable. Sinuses/Orbits: Pansinus mucosal thickening. Other: No evidence of acute intracranial injury. IMPRESSION: No evidence of acute intracranial injury. Electronically Signed   By: Macy Mis M.D.   On: 01/11/2021 13:42    Procedures Procedures   Medications Ordered in ED Medications  ondansetron (ZOFRAN) injection 4 mg (4 mg Intravenous Given 01/11/21 1355)  sodium chloride 0.9 % bolus 500 mL (0 mLs Intravenous Stopped 01/11/21 1505)    ED Course  I have reviewed the triage vital signs and the nursing notes.  Pertinent labs & imaging results that were available during my care of the patient were reviewed by me and considered in my medical decision making (see chart for details).  Patient seen and examined. Work-up initiated. Medications ordered.   Vital signs reviewed and are as follows: BP 134/83   Pulse 94   Temp 98.5 F (36.9 C) (Oral)   Resp 20   SpO2 90%   Patient discussed with and seen by Dr. Vanita Panda.  When patient attempted to ambulate, blood pressure dropped into the 70s with standing.  He is severely orthostatic.  Likely due to overdiuresis.  Will not be able to go home today.   BP (!) 161/69   Pulse 97   Temp 98.5 F (36.9 C) (Oral)  Resp 18   SpO2 90%   3:24 PM Spoke with Dr. Roosevelt Locks who will see.     MDM Rules/Calculators/A&P                          Admit.    Final Clinical Impression(s) / ED Diagnoses Final diagnoses:  Orthostatic hypotension  Compression fracture of T11 vertebra, initial encounter ([place])  Fall in home, initial encounter  Hyponatremia    Rx / DC Orders ED Discharge Orders    None       Carlisle Cater, Hershal Coria 01/11/21 1525    Carmin Muskrat, MD 01/11/21 [phone]

## 2021-01-11 NOTE — ED Notes (Signed)
Pt sat on the side of the bed and c/o back pain . Pt BP dropped from lying to sitting.Pt states he felt dizzy.Pt tried to stand and stated the pain was unbearable and he was unable to stand.Pt helped back to laying position in bed.

## 2021-01-12 DIAGNOSIS — E871 Hypo-osmolality and hyponatremia: Secondary | ICD-10-CM

## 2021-01-12 DIAGNOSIS — S22080A Wedge compression fracture of T11-T12 vertebra, initial encounter for closed fracture: Secondary | ICD-10-CM

## 2021-01-12 DIAGNOSIS — U071 COVID-19: Principal | ICD-10-CM

## 2021-01-12 LAB — BASIC METABOLIC PANEL WITH GFR
Anion gap: 10 (ref 5–15)
BUN: 18 mg/dL (ref 8–23)
CO2: 25 mmol/L (ref 22–32)
Calcium: 8.3 mg/dL — ABNORMAL LOW (ref 8.9–10.3)
Chloride: 94 mmol/L — ABNORMAL LOW (ref 98–111)
Creatinine, Ser: 1.29 mg/dL — ABNORMAL HIGH (ref 0.61–1.24)
GFR, Estimated: 54 mL/min — ABNORMAL LOW
Glucose, Bld: 99 mg/dL (ref 70–99)
Potassium: 3 mmol/L — ABNORMAL LOW (ref 3.5–5.1)
Sodium: 129 mmol/L — ABNORMAL LOW (ref 135–145)

## 2021-01-12 LAB — CBC
HCT: 31.1 % — ABNORMAL LOW (ref 39.0–52.0)
Hemoglobin: 10.2 g/dL — ABNORMAL LOW (ref 13.0–17.0)
MCH: 27 pg (ref 26.0–34.0)
MCHC: 32.8 g/dL (ref 30.0–36.0)
MCV: 82.3 fL (ref 80.0–100.0)
Platelets: 413 K/uL — ABNORMAL HIGH (ref 150–400)
RBC: 3.78 MIL/uL — ABNORMAL LOW (ref 4.22–5.81)
RDW: 14.1 % (ref 11.5–15.5)
WBC: 8.2 K/uL (ref 4.0–10.5)
nRBC: 0 % (ref 0.0–0.2)

## 2021-01-12 MED ORDER — SODIUM CHLORIDE 0.9 % IV SOLN: INTRAVENOUS | Status: AC | PRN

## 2021-01-12 MED ORDER — EPINEPHRINE 0.3 MG/0.3ML IJ SOAJ
0.3000 mg | Freq: Once | INTRAMUSCULAR | Status: AC | PRN
  Filled 2021-01-12: qty 0.6

## 2021-01-12 MED ORDER — METHYLPREDNISOLONE SODIUM SUCC 125 MG IJ SOLR: 125.0000 mg | Freq: Once | INTRAMUSCULAR | Status: AC | PRN

## 2021-01-12 MED ORDER — FAMOTIDINE IN NACL 20-0.9 MG/50ML-% IV SOLN
20.0000 mg | Freq: Once | INTRAVENOUS | Status: AC | PRN
  Filled 2021-01-12: qty 50

## 2021-01-12 MED ORDER — BEBTELOVIMAB 175 MG/2 ML IV (EUA)
175.0000 mg | Freq: Once | INTRAMUSCULAR | Status: AC
  Administered 2021-01-12: 175 mg via INTRAVENOUS
  Filled 2021-01-12: qty 2

## 2021-01-12 MED ORDER — DIPHENHYDRAMINE HCL 50 MG/ML IJ SOLN: 50.0000 mg | Freq: Once | INTRAMUSCULAR | Status: AC | PRN

## 2021-01-12 MED ORDER — MENTHOL 3 MG MT LOZG
1.0000 | LOZENGE | OROMUCOSAL | Status: DC | PRN
  Administered 2021-01-13: 3 mg via ORAL
  Filled 2021-01-12 (×2): qty 9

## 2021-01-12 MED ORDER — ALBUTEROL SULFATE HFA 108 (90 BASE) MCG/ACT IN AERS
2.0000 | INHALATION_SPRAY | Freq: Once | RESPIRATORY_TRACT | Status: AC | PRN
  Filled 2021-01-12: qty 6.7

## 2021-01-12 NOTE — Progress Notes (Addendum)
PROGRESS NOTE    [name]  KNL:[id number] DOB: 06-23-1932 DOA: 01/11/2021 PCP: Hoyt Koch, MD    Chief Complaint  Patient presents with  . Fall    Brief Narrative:    [name] is a 87 y.o. male with medical history significant of chronic back pain, chronic peripheral neuropathy, chronic T-spine and lumbar spine fracture status post kyphoplasty of T12, L1-3, HTN, chronic diastolic CHF, BPH, presented with worsening of weakness and frequent falls.  Patient has baseline ambulation dysfunction, uses walker to ambulate.  However for the last 6 weeks or so, patient's mobilization has significantly reduced due to worsening of his back pain and neuropathy.  Suspect pain is worse when standing up and walk and relieved by lying flat.  As a result, for last 6+ weeks, patient has largely remained bedbound. "Tried all the medications, but nothing worked" And he only take oxycodone for his back pain.  Recently, 3 weeks ago, he enrolled himself in "[facility]" program with laser beam treatment of the his neuropathy with some success.  Patient has BPH, and once a while, he will take Lasix for urination.  This morning, patient felt bladder is full, he took 80 mg of Lasix and then went to bathroom.  When he trying to stand up, he felt lightheaded and blurry vision and then fell down backward.  He then supported himself up and soon he had another episode of feeling lightheaded and then fell backward.  Denied any head injury, no loss of consciousness. ED Course: Positive for orthostatic vital signs.  CT head negative.  CT thoracic and lumbar spine showed progress of chronic T11 endplate fracture.  Assessment & Plan:   Active Problems:   Fall at home, initial encounter   Fall  Fall and near syncope -This is in the setting of orthostasis, from overdiuresis, volume depletion and dehydration likely in the setting of COVID infection as well. -Patient blood pressure this morning remains soft,  and he remains clinically dry, as well he remains significantly orthostatic with PT today 116/58 down to 89/47, just sitting at the edge of the bed, even before standing, where he was pretty symptomatic ,will continue with IV fluid at current rate 75 cc/h. -Continue to monitor on telemetry -Patient with increased falls recently, and unsteady gait, will consult PT  Hyponatremia -Sodium significantly low at 126 on presentation, this is due to volume depletion, continue with IV fluids . -Repeat BMP in a.m. .  COVID19 infection  -There is no hypoxia, no pneumonia,, will order MAB  HTN with orthostatic hypotension -Blood pressure remains low, will discontinue amlodipine, continue with decreased low-dose Coreg and Aldactone  Leukocytosis -UA negative for UTI, chest x-ray negative for acute infiltrates.  Suspect this is reactive, will monitor off antibiotics for now.  Chronic T11 fracture with chronic ambulation dysfunction -Outpatient pain management -PT evaluation.  BPH -Continue Flowmax.  CKD stage II -Cre level stable. On IVF.     DVT prophylaxis: Lovenox Code Status: DNR Family Communication: Discussed with wife by phone Disposition:   Status is: Inpatient  Remains inpatient appropriate because:Persistent severe electrolyte disturbances and IV treatments appropriate due to intensity of illness or inability to take PO   Dispo: The patient is from: Home              Anticipated d/c is to: SNF              Patient currently is not medically stable to d/c.   Difficult to  place patient No       Consultants:   none    Subjective:  He is complaining neurolyse weakness, fatigue, poor appetite   Objective: Vitals:   01/12/21 0228 01/12/21 0400 01/12/21 0430 01/12/21 0808  BP: (!) 100/52 109/60  113/60  Pulse:  82  93  Resp:  (!) 23  14  Temp:  98.9 F (37.2 C)  98.3 F (36.8 C)  TempSrc:  Axillary  Oral  SpO2:  (!) 89% 93% 94%  Weight:      Height:         Intake/Output Summary (Last 24 hours) at 01/12/2021 1308 Last data filed at 01/12/2021 1145 Gross per 24 hour  Intake --  Output 525 ml  Net -525 ml   Filed Weights   01/11/21 2112  Weight: 78.4 kg    Examination:  Awake Alert, extremely frail, deconditioned  .  symmetrical Chest wall movement, Good air movement bilaterally, CTAB RRR,No Gallops,Rubs or new Murmurs, No Parasternal Heave +ve B.Sounds, Abd Soft, No tenderness, No rebound - guarding or rigidity. No Cyanosis, Clubbing or edema, No new Rash or bruise      Data Reviewed: I have personally reviewed following labs and imaging studies  CBC: Recent Labs  Lab 01/11/21 1241 01/12/21 0122  WBC 12.2* 8.2  NEUTROABS 9.0*  --   HGB 12.4* 10.2*  HCT 38.0* 31.1*  MCV 83.3 82.3  PLT 508* 413*    Basic Metabolic Panel: Recent Labs  Lab 01/11/21 1241 01/11/21 2106 01/12/21 0122  NA 126* 129* 129*  K 3.7 3.6 3.0*  CL 91* 90* 94*  CO2 24 26 25   GLUCOSE 123* 114* 99  BUN 21 18 18   CREATININE 1.54* 1.31* 1.29*  CALCIUM 8.9 8.3* 8.3*    GFR: Estimated Creatinine Clearance: 44.3 mL/min (A) (by C-G formula based on SCr of 1.29 mg/dL (H)).  Liver Function Tests: Recent Labs  Lab 01/11/21 1241  AST 36  ALT 18  ALKPHOS 101  BILITOT 1.1  PROT 6.3*  ALBUMIN 3.1*    CBG: No results for input(s): GLUCAP in the last 168 hours.   Recent Results (from the past 240 hour(s))  Resp Panel by RT-PCR (Flu A&B, Covid) Nasopharyngeal Swab     Status: Abnormal   Collection Time: 01/11/21  2:35 PM   Specimen: Nasopharyngeal Swab; Nasopharyngeal(NP) swabs in vial transport medium  Result Value Ref Range Status   SARS Coronavirus 2 by RT PCR POSITIVE (A) NEGATIVE Final    Comment: RESULT CALLED TO, READ BACK BY AND VERIFIED WITH: Senaida Ores RN [id number] 01/11/21 A BROWNING (NOTE) SARS-CoV-2 target nucleic acids are DETECTED.  The SARS-CoV-2 RNA is generally detectable in upper respiratory specimens during the acute phase  of infection. Positive results are indicative of the presence of the identified virus, but do not rule out bacterial infection or co-infection with other pathogens not detected by the test. Clinical correlation with patient history and other diagnostic information is necessary to determine patient infection status. The expected result is Negative.  Fact Sheet for Patients: EntrepreneurPulse.com.au  Fact Sheet for Healthcare Providers: IncredibleEmployment.be  This test is not yet approved or cleared by the [place] FDA and  has been authorized for detection and/or diagnosis of SARS-CoV-2 by FDA under an Emergency Use Authorization (EUA).  This EUA will remain in effect (meaning this test can be  used) for the duration of  the COVID-19 declaration under Section 564(b)(1) of the Act, 21 U.S.C. section 360bbb-3(b)(1), unless the  authorization is terminated or revoked sooner.     Influenza A by PCR NEGATIVE NEGATIVE Final   Influenza B by PCR NEGATIVE NEGATIVE Final    Comment: (NOTE) The Xpert Xpress SARS-CoV-2/FLU/RSV plus assay is intended as an aid in the diagnosis of influenza from Nasopharyngeal swab specimens and should not be used as a sole basis for treatment. Nasal washings and aspirates are unacceptable for Xpert Xpress SARS-CoV-2/FLU/RSV testing.  Fact Sheet for Patients: EntrepreneurPulse.com.au  Fact Sheet for Healthcare Providers: IncredibleEmployment.be  This test is not yet approved or cleared by the [place] FDA and has been authorized for detection and/or diagnosis of SARS-CoV-2 by FDA under an Emergency Use Authorization (EUA). This EUA will remain in effect (meaning this test can be used) for the duration of the COVID-19 declaration under Section 564(b)(1) of the Act, 21 U.S.C. section 360bbb-3(b)(1), unless the authorization is terminated or revoked.  Performed at [facility], [place] [address], [place], [place] [address]          Radiology Studies: DG Chest 1 View  Result Date: 01/11/2021 CLINICAL DATA:  Fall.  Abdominal pain.  Back pain. EXAM: CHEST  1 VIEW COMPARISON:  One-view chest x-ray 08/08/2020 FINDINGS: Heart size normal. Aortic atherosclerosis noted. Lung volumes are low. Lungs are clear. IMPRESSION: 1. Low lung volumes. 2. No acute cardiopulmonary disease. 3. Aortic atherosclerosis. Electronically Signed   By: San Morelle M.D.   On: 01/11/2021 13:30   DG Thoracic Spine 2 View  Result Date: 01/11/2021 CLINICAL DATA:  Back pain.  Fall. EXAM: THORACIC SPINE 2 VIEWS COMPARISON:  Thoracic spine radiographs 11/21/2020 and thoracolumbar radiograph FINDINGS: T11 superior endplate compression fracture is new from November and appears to have progressed from April. No additional thoracic fractures are present. Multilevel endplate degenerative changes noted. Aortic atherosclerosis present. Thoracic kyphosis stable. IMPRESSION: 1. Probable progression of T11 superior endplate compression fracture since April. MRI of the thoracic spine or lumbar spine could be used for further evaluation. Both are not necessary. 2. No additional thoracic fractures. 3. Aortic atherosclerosis. Electronically Signed   By: San Morelle M.D.   On: 01/11/2021 13:29   DG Lumbar Spine Complete  Result Date: 01/11/2021 CLINICAL DATA:  Fall.  Back pain. EXAM: LUMBAR SPINE - COMPLETE 4+ VIEW COMPARISON:  09/07/2020. FINDINGS: Lumbar spine numbered the lowest segmented appearing lumbar shaped on lateral view vertebra as L5. L5-S1 posterior fusion. T12, L1, L2, L3 vertebroplasties again noted. Diffuse multilevel degenerative change. Stable mild anterolisthesis L5 on S1. No acute bony abnormality. Aortoiliac and visceral atherosclerotic vascular changes. IMPRESSION: Postsurgical changes lumbar spine as above. Diffuse osteopenia degenerative change. Mild stable L5 on S1  anterolisthesis. No acute bony abnormality. 2.  Aortoiliac atherosclerotic vascular disease. Electronically Signed   By: Marcello Moores  Register   On: 01/11/2021 13:33   CT Head Wo Contrast  Result Date: 01/11/2021 CLINICAL DATA:  Fall EXAM: CT HEAD WITHOUT CONTRAST TECHNIQUE: Contiguous axial images were obtained from the base of the skull through the vertex without intravenous contrast. COMPARISON:  07/13/2020 FINDINGS: Brain: There is no acute intracranial hemorrhage, mass effect, or edema. Gray-white differentiation is preserved. There is no extra-axial fluid collection. Prominence of the ventricles and sulci reflects stable parenchymal volume loss. Patchy hypoattenuation in the supratentorial white matter is nonspecific but probably reflects similar minor chronic microvascular ischemic changes. Vascular: There is atherosclerotic calcification at the skull base. Skull: Calvarium is unremarkable. Sinuses/Orbits: Pansinus mucosal thickening. Other: No evidence of acute intracranial injury. IMPRESSION: No evidence  of acute intracranial injury. Electronically Signed   By: Macy Mis M.D.   On: 01/11/2021 13:42        Scheduled Meds: . amitriptyline  50 mg Oral QHS  . amLODipine  5 mg Oral Daily  . carvedilol  12.5 mg Oral BID WC  . celecoxib  200 mg Oral BID  . enoxaparin (LOVENOX) injection  40 mg Subcutaneous Q24H  . mouth rinse  15 mL Mouth Rinse BID  . oxyCODONE  10 mg Oral QID  . spironolactone  25 mg Oral Daily  . tamsulosin  0.4 mg Oral Daily   Continuous Infusions: . sodium chloride 75 mL/hr at 01/11/21 1916     LOS: 1 day       Phillips Climes, MD [facility]   To contact the attending provider between 7A-7P or the covering provider during after hours 7P-7A, please log into the web site [email or web address] and access using universal [place] password for that web site. If you do not have the password, please call the hospital operator.  01/12/2021, 1:08 PM

## 2021-01-12 NOTE — Evaluation (Signed)
Physical Therapy Evaluation Patient Details Name: MONTE ZINNI MRN: [id number] DOB: 07/02/1932 Today's Date: 01/12/2021   History of Present Illness  87yo male admitted 01/11/21 due to weakness, frequent falls, ongoing back pain, and some near syncopal episodes. Has been bed bound for the past 6 weeks secondary to back pain. Found to be Covid positive. PMH CAD, carpal tunnel s/p release, CHF, chronic back and neck pain, hx DVT, gout, HLD, HTN, kyphoplasty, TKA, patella ORIF, lumbar and cervical fusions  Clinical Impression    Patient received in bed, pleasant and cooperative with PT but with virtually no awareness of current deficits or safety. Needed fairly constant MinA for all aspects of mobility, and only able to tolerate sitting at EOB long enough for sitting BP to be taken before needing to lay back down due to back pain and wooziness. Orthostatic blood pressures as follows, patient symptomatic:  Supine 116/58 Sitting EOB 89/47 Return to supine 120/65 EOS with HOB at 23 degrees 124/68  After returning to supine, he stated that he needed to pee and wanted to get to the bedside commode- has very poor awareness of functional deficits and safety. Set him up with urinal in bed, left with bed alarm active and all needs otherwise met. Definitely in need of skilled rehab services in SNF setting moving forward.     Follow Up Recommendations SNF;Supervision/Assistance - 24 hour    Equipment Recommendations  Rolling walker with 5" wheels;3in1 (PT);Wheelchair (measurements PT);Wheelchair cushion (measurements PT)    Recommendations for Other Services       Precautions / Restrictions Precautions Precautions: Fall;Other (comment) Precaution Comments: chronic back pain, multiple kyphoplasties (most recent January 2022), back precautions for comfort, watch BP  Restrictions Weight Bearing Restrictions: No      Mobility  Bed Mobility Overal bed mobility: Needs Assistance Bed Mobility:  Rolling;Sidelying to Sit;Sit to Sidelying Rolling: Min assist Sidelying to sit: Min assist     Sit to sidelying: Min assist General bed mobility comments: MinA to roll completely over onto his side, also MinA to help control motion and ensure good mechanics while getting from side<-> sit at EOB    Transfers                 General transfer comment: deferred- hypotensive  Ambulation/Gait             General Gait Details: deferred- hypotensive  Stairs            Wheelchair Mobility    Modified Rankin (Stroke Patients Only)       Balance Overall balance assessment: Needs assistance;History of Falls Sitting-balance support: Feet supported;Single extremity supported Sitting balance-Leahy Scale: Fair Sitting balance - Comments: MinA to maintain balance at EOB even with U UE support       Standing balance comment: deferred- hypotension                             Pertinent Vitals/Pain Pain Assessment: 0-10 Pain Score: 7  Pain Location: lower back Pain Descriptors / Indicators: Aching;Sore Pain Intervention(s): Limited activity within patient's tolerance;Monitored during session    [place] expects to be discharged to:: Private residence Living Arrangements: Spouse/significant other Available Help at Discharge: Family;Available 24 hours/day (niece and her son live nearby too) Type of Home: House Home Access: Elevator     Home Layout: Two level Home Equipment: Cane - quad;Cane - single point;Walker - 2 wheels;Crutches;Shower seat;Grab bars - tub/shower Additional  Comments: DME belongs to his wife but "I could borrow it"    Prior Function Level of Independence: Independent with assistive device(s)         Comments: uses a walker at this point; 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 Had 2 falls which led to admission, no close calls but admits "I start to do something then I fall backwards"      Hand Dominance   Dominant Hand: Left    Extremity/Trunk Assessment   Upper Extremity Assessment Upper Extremity Assessment: Defer to OT evaluation    Lower Extremity Assessment Lower Extremity Assessment: Generalized weakness    Cervical / Trunk Assessment Cervical / Trunk Assessment: Kyphotic  Communication   Communication: No difficulties  Cognition Arousal/Alertness: Awake/alert Behavior During Therapy: WFL for tasks assessed/performed;Flat affect Overall Cognitive Status: No family/caregiver present to determine baseline cognitive functioning Area of Impairment: Orientation;Attention;Memory;Following commands;Safety/judgement;Awareness;Problem solving                 Orientation Level: Disoriented to;Time Current Attention Level: Selective Memory: Decreased short-term memory Following Commands: Follows one step commands consistently;Follows one step commands with increased time Safety/Judgement: Decreased awareness of safety;Decreased awareness of deficits Awareness: Intellectual Problem Solving: Slow processing;Difficulty sequencing;Requires verbal cues General Comments: able to tell me we are in June but not what year it is; after trying mobility and not being able tolerate sitting at EOB for longer than it took to get BP reading, he became insistant that he needed to stand to pee. Poor awareness of deficits and safety, poor problem solving noted.      General Comments General comments (skin integrity, edema, etc.): session limited due to orthostasis, MD aware    Exercises     Assessment/Plan    PT Assessment Patient needs continued PT services  PT Problem List Decreased strength;Decreased cognition;Decreased knowledge of use of DME;Decreased activity tolerance;Decreased safety awareness;Decreased balance;Decreased knowledge of precautions;Pain;Decreased mobility;Decreased coordination       PT Treatment Interventions DME instruction;Balance  training;Gait training;Stair training;Cognitive remediation;Functional mobility training;Patient/family education;Therapeutic activities;Therapeutic exercise    PT Goals (Current goals can be found in the Care Plan section)  Acute Rehab PT Goals Patient Stated Goal: go home after Covid is resolved PT Goal Formulation: With patient Time For Goal Achievement: 01/26/21 Potential to Achieve Goals: Fair    Frequency Min 2X/week   Barriers to discharge        Co-evaluation               AM-PAC PT "6 Clicks" Mobility  Outcome Measure Help needed turning from your back to your side while in a flat bed without using bedrails?: A Little Help needed moving from lying on your back to sitting on the side of a flat bed without using bedrails?: A Lot Help needed moving to and from a bed to a chair (including a wheelchair)?: Total Help needed standing up from a chair using your arms (e.g., wheelchair or bedside chair)?: Total Help needed to walk in hospital room?: Total Help needed climbing 3-5 steps with a railing? : Total 6 Click Score: 9    End of Session Equipment Utilized During Treatment: Oxygen Activity Tolerance: Patient tolerated treatment well Patient left: in bed;with call bell/phone within reach;with bed alarm set Nurse Communication: Mobility status;Other (comment) (MD aware of orthostasis) PT Visit Diagnosis: Unsteadiness on feet (R26.81);Difficulty in walking, not elsewhere classified (R26.2);Muscle weakness (generalized) (M62.81);Other abnormalities of gait and mobility (R26.89);Pain Pain - Right/Left:  (midline) Pain - part  of body:  (back pain)    Time: 4765-4650 PT Time Calculation (min) (ACUTE ONLY): 28 min   Charges:   PT Evaluation $PT Eval Moderate Complexity: 1 Mod PT Treatments $Therapeutic Activity: 8-22 mins       Windell Norfolk, DPT, PN1   Supplemental Physical Therapist [place]    Pager [phone] Acute Rehab Office [phone]

## 2021-01-13 DIAGNOSIS — W19XXXS Unspecified fall, sequela: Secondary | ICD-10-CM

## 2021-01-13 LAB — BASIC METABOLIC PANEL
Anion gap: 6 (ref 5–15)
BUN: 15 mg/dL (ref 8–23)
CO2: 28 mmol/L (ref 22–32)
Calcium: 8.6 mg/dL — ABNORMAL LOW (ref 8.9–10.3)
Chloride: 97 mmol/L — ABNORMAL LOW (ref 98–111)
Creatinine, Ser: 1.28 mg/dL — ABNORMAL HIGH (ref 0.61–1.24)
GFR, Estimated: 54 mL/min — ABNORMAL LOW (ref >60)
Glucose, Bld: 91 mg/dL (ref 70–99)
Potassium: 4 mmol/L (ref 3.5–5.1)
Sodium: 131 mmol/L — ABNORMAL LOW (ref 135–145)

## 2021-01-13 LAB — CBC
HCT: 33.8 % — ABNORMAL LOW (ref 39.0–52.0)
Hemoglobin: 10.9 g/dL — ABNORMAL LOW (ref 13.0–17.0)
MCH: 27.2 pg (ref 26.0–34.0)
MCHC: 32.2 g/dL (ref 30.0–36.0)
MCV: 84.3 fL (ref 80.0–100.0)
Platelets: 451 10*3/uL — ABNORMAL HIGH (ref 150–400)
RBC: 4.01 MIL/uL — ABNORMAL LOW (ref 4.22–5.81)
RDW: 14.2 % (ref 11.5–15.5)
WBC: 8 10*3/uL (ref 4.0–10.5)
nRBC: 0 % (ref 0.0–0.2)

## 2021-01-13 LAB — MAGNESIUM: Magnesium: 1.9 mg/dL (ref 1.7–2.4)

## 2021-01-13 MED ORDER — WHITE PETROLATUM EX OINT
TOPICAL_OINTMENT | CUTANEOUS | Status: AC
  Administered 2021-01-13: 0.2
  Filled 2021-01-13: qty 28.35

## 2021-01-13 MED ORDER — SODIUM CHLORIDE 0.9 % IV SOLN: INTRAVENOUS | Status: DC

## 2021-01-13 MED ORDER — SALINE SPRAY 0.65 % NA SOLN
1.0000 | NASAL | Status: DC | PRN
  Administered 2021-01-14 – 2021-01-17 (×2): 1 via NASAL
  Filled 2021-01-13: qty 44

## 2021-01-13 MED ORDER — LIP MEDEX EX OINT
TOPICAL_OINTMENT | CUTANEOUS | Status: DC | PRN
  Filled 2021-01-13: qty 7

## 2021-01-13 MED ORDER — CARVEDILOL 3.125 MG PO TABS
3.1250 mg | ORAL_TABLET | Freq: Two times a day (BID) | ORAL | Status: DC
  Administered 2021-01-13 – 2021-01-15 (×5): 3.125 mg via ORAL
  Filled 2021-01-13 (×5): qty 1

## 2021-01-13 MED ORDER — SENNOSIDES-DOCUSATE SODIUM 8.6-50 MG PO TABS
1.0000 | ORAL_TABLET | Freq: Two times a day (BID) | ORAL | Status: DC
  Administered 2021-01-13 – 2021-01-17 (×6): 1 via ORAL
  Filled 2021-01-13 (×7): qty 1

## 2021-01-13 MED ORDER — POLYETHYLENE GLYCOL 3350 17 G PO PACK
34.0000 g | PACK | Freq: Once | ORAL | Status: AC
  Administered 2021-01-13: 34 g via ORAL
  Filled 2021-01-13: qty 2

## 2021-01-13 NOTE — Evaluation (Signed)
2Occupational Therapy Evaluation Patient Details Name: [name] MRN: [id number] DOB: 05-24-1932 Today's Date: 01/13/2021    History of Present Illness 87yo male admitted 01/11/21 due to weakness, frequent falls, ongoing back pain, and some near syncopal episodes. Has been bed bound for the past 6 weeks secondary to back pain. Found to be Covid positive. PMH CAD, carpal tunnel s/p release, CHF, chronic back and neck pain, hx DVT, gout, HLD, HTN, kyphoplasty, TKA, patella ORIF, lumbar and cervical fusions   Clinical Impression   This 87 yo male admitted with above presents to acute OT with PLOF as of the last 6 weeks of being primarily bed bound due pt back pain and needing A for all basic ADLs. Currently he is min guard A for rolling, sidelying<>sit, but can only tolerate sitting EOB long enough to get BP reading then needs to lay back down due to pain; thus ADLs are not at a functional level. He will continue to benefit from acute OT (trying to optimize pain meds before seeing him) with follow up at [facility]. We will continue to follow.    Follow Up Recommendations  SNF;Supervision/Assistance - 24 hour    Equipment Recommendations  Other (comment) (TBD next venue)       Precautions / Restrictions Precautions Precautions: Fall Precaution Comments: chronic back pain, multiple kyphoplasties (most recent January 2022), back precautions for comfort Restrictions Weight Bearing Restrictions: No      Mobility Bed Mobility Overal bed mobility: Needs Assistance Bed Mobility: Rolling;Sidelying to Sit;Sit to Sidelying Rolling: Min guard Sidelying to sit: Min guard     Sit to sidelying: Min guard General bed mobility comments: VCs for sequencing       Balance Overall balance assessment: Needs assistance Sitting-balance support: Single extremity supported;Feet supported Sitting balance-Leahy Scale: Poor Sitting balance - Comments: Needed one UE for support at EOB due to makes the pain in  his back a little better--would prefer two hands but needed to get his BP                                   ADL either performed or assessed with clinical judgement   ADL Overall ADL's : Needs assistance/impaired Eating/Feeding: Set up;Bed level   Grooming: Set up;Bed level   Upper Body Bathing: Set up;Bed level   Lower Body Bathing: Total assistance;Bed level   Upper Body Dressing : Total assistance;Bed level   Lower Body Dressing: Total assistance;Bed level     Toilet Transfer Details (indicate cue type and reason): unable to get out of bed to [facility] due to back pain Toileting- Clothing Manipulation and Hygiene: Total assistance;Bed level               Vision Patient Visual Report: No change from baseline              Pertinent Vitals/Pain Pain Assessment: 0-10 Pain Score: 8  Pain Location: mid back Pain Descriptors / Indicators: Aching;Sore;Guarding;Constant Pain Intervention(s): Limited activity within patient's tolerance;Monitored during session;Premedicated before session     Hand Dominance Left   Extremity/Trunk Assessment Upper Extremity Assessment Upper Extremity Assessment: Overall WFL for tasks assessed           Communication Communication Communication: No difficulties   Cognition Arousal/Alertness: Awake/alert Behavior During Therapy: WFL for tasks assessed/performed Overall Cognitive Status: No family/caregiver present to determine baseline cognitive functioning Area of Impairment: Awareness;Safety/judgement  Safety/Judgement: Decreased awareness of safety Awareness: Intellectual   General Comments: reported no one had been in to see him today--but RN had.              Home Living Family/patient expects to be discharged to:: Skilled nursing facility                                        Prior Functioning/Environment          Comments: mainly bedbound the last 6  weeks due to back pain        OT Problem List: Decreased strength;Decreased range of motion;Decreased activity tolerance;Impaired balance (sitting and/or standing);Cardiopulmonary status limiting activity;Pain      OT Treatment/Interventions: Self-care/ADL training;DME and/or AE instruction;Patient/family education;Balance training    OT Goals(Current goals can be found in the care plan section) Acute Rehab OT Goals Patient Stated Goal: for pain to be better OT Goal Formulation: With patient Time For Goal Achievement: 01/27/21 Potential to Achieve Goals: Fair  OT Frequency: Min 2X/week              AM-PAC OT "6 Clicks" Daily Activity     Outcome Measure Help from another person eating meals?: A Little Help from another person taking care of personal grooming?: A Little Help from another person toileting, which includes using toliet, bedpan, or urinal?: Total Help from another person bathing (including washing, rinsing, drying)?: A Lot Help from another person to put on and taking off regular upper body clothing?: Total Help from another person to put on and taking off regular lower body clothing?: Total 6 Click Score: 11   End of Session Nurse Communication:  (told covering RN that pt reports he has not had a bowel movement in 3 days)  Activity Tolerance: Patient limited by pain Patient left: in bed;with call bell/phone within reach;with bed alarm set  OT Visit Diagnosis: Other abnormalities of gait and mobility (R26.89);Muscle weakness (generalized) (M62.81);Pain Pain - part of body:  (back)                Time: 1210-1228 OT Time Calculation (min): 18 min Charges:  OT General Charges $OT Visit: 1 Visit OT Evaluation $OT Eval Moderate Complexity: [place], OTR/L Acute [employer] Pager [phone] Office [phone]     Donye, Campanelli 01/13/2021, 12:46 PM

## 2021-01-13 NOTE — TOC Progression Note (Addendum)
Transition of Care [facility]) - Progression Note    Patient Details  Name: MATISSE SALAIS MRN: [id number] Date of Birth: [birth year]  Transition of Care [facility]) CM/SW [place], LCSW Phone Number: 01/13/2021, 5:00 PM  Clinical Narrative:    CSW received additional SNF bed offer of [facility]. CSW updated patient's spouse. She stated that would be fine if patient is in agreement. [facility] double-checking to make sure they have a spot for patient; they accept weekend admissions if staff is available. Will have weekend CSW follow up. CSW submitted clinicals to insurance, Ref# [id number].     Barriers to Discharge: Continued Medical Work up  Expected Discharge Plan and Services   In-house Referral: Clinical Social Work   Post Acute Care Choice: [place] arrangements for the past 2 months: Single Family Home                                       Social Determinants of Health (SDOH) Interventions    Readmission Risk Interventions No flowsheet data found.

## 2021-01-13 NOTE — TOC Initial Note (Signed)
Transition of Care [facility]) - Initial/Assessment Note    Patient Details  Name: [name] MRN: [id number] Date of Birth: [birth year]  Transition of Care [facility]) CM/SW Contact:    Benard Halsted, LCSW Phone Number: 01/13/2021, 11:16 AM  Clinical Narrative:                 CSW received consult for possible SNF placement at time of discharge. CSW spoke with patient. Patient reported that patient's spouse is currently unable to care for patient at their home given patient's current physical needs and fall risk. Patient expressed understanding of PT recommendation and may be agreeable to SNF placement at time of discharge. CSW discussed insurance authorization process and barrier of being COVID positive. [facility] is the only SNF accepting COVID patient at this time but patient stated he will not go there due to his wife having a bad experience there. No further questions reported at this time. CSW will check back with patient.     Barriers to Discharge: Continued Medical Work up   Patient Goals and CMS Choice Patient states their goals for this hospitalization and ongoing recovery are:: Rehab CMS Medicare.gov Compare Post Acute Care list provided to:: Patient Choice offered to / list presented to : Patient  Expected Discharge Plan and Services   In-house Referral: Clinical Social Work   Post Acute Care Choice: [place] Living arrangements for the past 2 months: [place]                                      Prior Living Arrangements/Services Living arrangements for the past 2 months: Single Family Home Lives with:: Spouse Patient language and need for interpreter reviewed:: Yes Do you feel safe going back to the place where you live?: Yes      Need for Family Participation in Patient Care: Yes (Comment) Care giver support system in place?: Yes (comment) Current home services: DME Criminal Activity/Legal Involvement Pertinent to Current  Situation/Hospitalization: No - Comment as needed  Activities of Daily Living Home Assistive Devices/Equipment: Gilford Rile (specify type) (front wheel walker) ADL Screening (condition at time of admission) Patient's cognitive ability adequate to safely complete daily activities?: Yes Is the patient deaf or have difficulty hearing?: No Does the patient have difficulty seeing, even when wearing glasses/contacts?: No Does the patient have difficulty concentrating, remembering, or making decisions?: No Patient able to express need for assistance with ADLs?: No Does the patient have difficulty dressing or bathing?: No Independently performs ADLs?: Yes (appropriate for developmental age) Does the patient have difficulty walking or climbing stairs?: Yes Weakness of Legs: Both Weakness of Arms/Hands: None  Permission Sought/Granted Permission sought to share information with : Facility Art therapist granted to share information with : Yes, Verbal Permission Granted     Permission granted to share info w AGENCY: SNFs        Emotional Assessment   Attitude/Demeanor/Rapport: Engaged,Gracious Affect (typically observed): Accepting,Appropriate Orientation: : Oriented to Self,Oriented to Place,Oriented to  Time,Oriented to Situation Alcohol / Substance Use: Not Applicable Psych Involvement: No (comment)  Admission diagnosis:  Orthostatic hypotension [I95.1] Hyponatremia [E87.1] Weakness [R53.1] Fall [W19.XXXA] Fall in home, initial encounter [W19.XXXA, Y92.009] Compression fracture of T11 vertebra, initial encounter [facility]) [W09.811B] Patient Active Problem List   Diagnosis Date Noted  . Fall at home, initial encounter 01/11/2021  . Fall 01/11/2021  . Orthostatic hypotension   .  Weakness   . Osteoporosis 10/07/2020  . Lumbar compression fracture ([place]) 09/07/2020  . Cellulitis 08/08/2020  . Acute renal failure ([place]) 08/08/2020  . Greater trochanteric bursitis of both  hips 05/04/2020  . Elevated d-dimer 05/04/2020  . Cellulitis of leg, right 02/03/2020  . Left patella fracture 01/19/2020  . Arthritis of left knee 05/07/2019  . Pre-operative clearance 04/28/2019  . Chronic diastolic CHF (congestive heart failure) ([place]) 08/10/2018  . Leg wound, left 03/19/2018  . Right arm pain 11/11/2017  . Primary osteoarthritis of right foot 08/21/2017  . Syncope 02/27/2017  . Arthritis of right ankle 12/18/2016  . Carpal tunnel syndrome, bilateral 09/04/2016  . Leg swelling 08/16/2016  . Hand weakness 07/12/2016  . Degenerative arthritis of left knee 06/28/2016  . Iron deficiency anemia 03/16/2016  . Arthritis of left acromioclavicular joint 01/03/2016  . Rotator cuff disorder 01/03/2016  . Osteoarthritis of left knee 04/30/2015  . Baker's cyst of knee 04/30/2015  . Esophageal stricture 04/26/2015  . Benign prostatic hyperplasia 02/21/2015  . Constipation 02/21/2015  . Myalgia and myositis 11/18/2014  . Polyarticular arthritis 07/19/2013  . Chronic radicular low back pain 12/15/2012  . History of gout 02/20/2010  . ERECTILE DYSFUNCTION, ORGANIC 02/20/2010  . Hyperlipidemia 10/18/2008  . CAD (coronary artery disease) 08/18/2007  . Essential hypertension 05/09/2007  . ALLERGIC RHINITIS 03/27/2007   PCP:  Hoyt Koch, MD Pharmacy:   [id number] - [place], [place] [address] Phone: [phone] Fax: [phone]     Social Determinants of Health (SDOH) Interventions    Readmission Risk Interventions No flowsheet data found.

## 2021-01-13 NOTE — NC FL2 (Signed)
[place] LEVEL OF CARE SCREENING TOOL     IDENTIFICATION  Patient Name: [name] Birthdate: [birth year] Sex: male Admission Date (Current Location): 01/11/2021  [facility] and [place] Number:  Herbalist and Address:  [facility], [place] [address] [place], [place] [address]      Provider Number: [id number]  Attending Physician Name and Address:  Albertine Patricia, MD  Relative Name and Phone Number:  Jana Half, spouse, [phone]    Current Level of Care: Hospital Recommended Level of Care: [place] Prior Approval Number:    Date Approved/Denied:   PASRR Number: [id number]  Discharge Plan: SNF    Current Diagnoses: Patient Active Problem List   Diagnosis Date Noted  . Fall at home, initial encounter 01/11/2021  . Fall 01/11/2021  . Orthostatic hypotension   . Weakness   . Osteoporosis 10/07/2020  . Lumbar compression fracture ([place]) 09/07/2020  . Cellulitis 08/08/2020  . Acute renal failure ([place]) 08/08/2020  . Greater trochanteric bursitis of both hips 05/04/2020  . Elevated d-dimer 05/04/2020  . Cellulitis of leg, right 02/03/2020  . Left patella fracture 01/19/2020  . Arthritis of left knee 05/07/2019  . Pre-operative clearance 04/28/2019  . Chronic diastolic CHF (congestive heart failure) ([place]) 08/10/2018  . Leg wound, left 03/19/2018  . Right arm pain 11/11/2017  . Primary osteoarthritis of right foot 08/21/2017  . Syncope 02/27/2017  . Arthritis of right ankle 12/18/2016  . Carpal tunnel syndrome, bilateral 09/04/2016  . Leg swelling 08/16/2016  . Hand weakness 07/12/2016  . Degenerative arthritis of left knee 06/28/2016  . Iron deficiency anemia 03/16/2016  . Arthritis of left acromioclavicular joint 01/03/2016  . Rotator cuff disorder 01/03/2016  . Osteoarthritis of left knee 04/30/2015  . Baker's cyst of knee 04/30/2015  . Esophageal stricture 04/26/2015  . Benign prostatic  hyperplasia 02/21/2015  . Constipation 02/21/2015  . Myalgia and myositis 11/18/2014  . Polyarticular arthritis 07/19/2013  . Chronic radicular low back pain 12/15/2012  . History of gout 02/20/2010  . ERECTILE DYSFUNCTION, ORGANIC 02/20/2010  . Hyperlipidemia 10/18/2008  . CAD (coronary artery disease) 08/18/2007  . Essential hypertension 05/09/2007  . ALLERGIC RHINITIS 03/27/2007    Orientation RESPIRATION BLADDER Height & Weight     Self,Time,Situation,Place  O2 (Nasal cannula 2L) Continent Weight: 173 lb 11.6 oz (78.8 kg) Height:  6' (182.9 cm)  BEHAVIORAL SYMPTOMS/MOOD NEUROLOGICAL BOWEL NUTRITION STATUS      Continent Diet (Please see DC Summary)  AMBULATORY STATUS COMMUNICATION OF NEEDS Skin   Extensive Assist Verbally Normal                       Personal Care Assistance Level of Assistance  Bathing,Feeding,Dressing Bathing Assistance: Limited assistance Feeding assistance: Limited assistance Dressing Assistance: Limited assistance     Functional Limitations Info  Sight,Hearing,Speech Sight Info: Adequate Hearing Info: Adequate Speech Info: Adequate    SPECIAL CARE FACTORS FREQUENCY  PT (By licensed PT),OT (By licensed OT)     PT Frequency: 5x/week OT Frequency: 5x/week            Contractures Contractures Info: Not present    Additional Factors Info  Code Status,Allergies,Isolation Precautions Code Status Info: DNR Allergies Info: Lyrica (Pregabalin), Gabapentin     Isolation Precautions Info: COVID+ 01/11/21     Current Medications (01/13/2021):  This is the current hospital active medication list Current Facility-Administered Medications  Medication Dose Route Frequency Provider Last Rate Last  Admin  . 0.9 %  sodium chloride infusion   Intravenous Continuous Elgergawy, Silver Huguenin, MD 75 mL/hr at 01/13/21 1422 New Bag at 01/13/21 1422  . amitriptyline (ELAVIL) tablet 50 mg  50 mg Oral QHS Wynetta Fines T, MD   50 mg at 01/12/21 2142  . carvedilol  (COREG) tablet 3.125 mg  3.125 mg Oral BID WC Elgergawy, Silver Huguenin, MD      . celecoxib (CELEBREX) capsule 200 mg  200 mg Oral BID Wynetta Fines T, MD   200 mg at 01/13/21 0908  . enoxaparin (LOVENOX) injection 40 mg  40 mg Subcutaneous Q24H Wynetta Fines T, MD   40 mg at 01/12/21 1834  . fluticasone (FLONASE) 50 MCG/ACT nasal spray 1 spray  1 spray Each Nare Daily PRN Wynetta Fines T, MD      . hydrALAZINE (APRESOLINE) tablet 25 mg  25 mg Oral Q6H PRN Wynetta Fines T, MD      . lip balm (CARMEX) ointment   Topical PRN Elgergawy, Silver Huguenin, MD      . magnesium citrate solution 0.5 Bottle  0.5 Bottle Oral Daily PRN Wynetta Fines T, MD      . MEDLINE mouth rinse  15 mL Mouth Rinse BID Shalhoub, Sherryll Burger, MD   15 mL at 01/13/21 0910  . menthol-cetylpyridinium (CEPACOL) lozenge 3 mg  1 lozenge Oral PRN Shalhoub, Sherryll Burger, MD   3 mg at 01/13/21 0007  . ondansetron (ZOFRAN-ODT) disintegrating tablet 8 mg  8 mg Oral Q8H PRN Wynetta Fines T, MD   8 mg at 01/12/21 2203  . oxyCODONE (Oxy IR/ROXICODONE) immediate release tablet 10 mg  10 mg Oral QID Wynetta Fines T, MD   10 mg at 01/13/21 1421  . polyethylene glycol (MIRALAX / GLYCOLAX) packet 34 g  34 g Oral Once Elgergawy, Silver Huguenin, MD      . senna-docusate (Senokot-S) tablet 1 tablet  1 tablet Oral BID Elgergawy, Silver Huguenin, MD      . tamsulosin (FLOMAX) capsule 0.4 mg  0.4 mg Oral Daily Wynetta Fines T, MD   0.4 mg at 01/13/21 0909     Discharge Medications: Please see discharge summary for a list of discharge medications.  Relevant Imaging Results:  Relevant Lab Results:   Additional Information SSN: [id number]. [place] COVID-19 Vaccine 08/01/2020 , 10/12/2019 , 09/17/2019  Benard Halsted, LCSW

## 2021-01-13 NOTE — Progress Notes (Signed)
PROGRESS NOTE    [name]  HMC:[id number] DOB: [birth year] DOA: 01/11/2021 PCP: Hoyt Koch, MD    Chief Complaint  Patient presents with  . Fall    Brief Narrative:    [name] is a 87 y.o. male with medical history significant of chronic back pain, chronic peripheral neuropathy, chronic T-spine and lumbar spine fracture status post kyphoplasty of T12, L1-3, HTN, chronic diastolic CHF, BPH, presented with worsening of weakness and frequent falls.  Patient has baseline ambulation dysfunction, uses walker to ambulate.  However for the last 6 weeks or so, patient's mobilization has significantly reduced due to worsening of his back pain and neuropathy.  Suspect pain is worse when standing up and walk and relieved by lying flat.  As a result, for last 6+ weeks, patient has largely remained bedbound. "Tried all the medications, but nothing worked" And he only take oxycodone for his back pain.  Recently, 3 weeks ago, he enrolled himself in "ALIGN" program with laser beam treatment of the his neuropathy with some success.  Patient has BPH, and once a while, he will take Lasix for urination.  This morning, patient felt bladder is full, he took 80 mg of Lasix and then went to bathroom.  When he trying to stand up, he felt lightheaded and blurry vision and then fell down backward.  He then supported himself up and soon he had another episode of feeling lightheaded and then fell backward.  Denied any head injury, no loss of consciousness. ED Course: Positive for orthostatic vital signs.  CT head negative.  CT thoracic and lumbar spine showed progress of chronic T11 endplate fracture.  Assessment & Plan:   Active Problems:   Fall at home, initial encounter   Fall  Fall and near syncope -This is in the setting of orthostasis, from overdiuresis, volume depletion and dehydration likely in the setting of COVID infection as well. -He remains orthostatic today, but less  symptomatic, he was encouraged to increase his fluid intake, hyponatremia is improving, will continue with IV normal saline.  . -Patient was seen by PT, recommendation for SNF placement  Hyponatremia -Sodium significantly low at 126 on presentation, this is due to volume depletion, continue with IV fluids .  COVID19 infection  -There is no hypoxia, no pneumonia,, will order MAB  HTN with orthostatic hypotension -Blood pressure remains low, so we have discontinued his amlodipine, and I have discontinued his Aldactone today, and I have decreased his Coreg to lowest dose at 3.125 mg oral twice daily.  ALU yearly to Leukocytosis -UA negative for UTI, chest x-ray negative for acute infiltrates.  Suspect this is reactive, will monitor off antibiotics for now.  Chronic T11 fracture with chronic ambulation dysfunction -Outpatient pain management -PT evaluation.  BPH -Continue Flowmax.  CKD stage II -Cre level stable. On IVF.     DVT prophylaxis: Lovenox Code Status: DNR Family Communication: Discussed with wife by phone daily Jackqulyn Livings Disposition:   Status is: Inpatient  Remains inpatient appropriate because:Persistent severe electrolyte disturbances and IV treatments appropriate due to intensity of illness or inability to take PO   Dispo: The patient is from: Home              Anticipated d/c is to: SNF              Patient currently is not medically stable to d/c.   Difficult to place patient No       Consultants:   none  Subjective:  He reports constipation, no BM for last 3 days. He reports appetite still poor, but he is drinking fluids.    Objective: Vitals:   01/13/21 0300 01/13/21 0447 01/13/21 0600 01/13/21 0801  BP:  103/81  (!) 108/56  Pulse:  81  90  Resp:  14  14  Temp:  98.3 F (36.8 C)  98.1 F (36.7 C)  TempSrc:  Axillary  Oral  SpO2: 93% 95%  95%  Weight:   78.8 kg   Height:        Intake/Output Summary (Last 24 hours) at  01/13/2021 1513 Last data filed at 01/13/2021 1447 Gross per 24 hour  Intake 1420.03 ml  Output 710 ml  Net 710.03 ml   Filed Weights   01/11/21 2112 01/13/21 0600  Weight: 78.4 kg 78.8 kg    Examination:  Awake Alert, Oriented X 3,frail, deconditioned,  No new F.N deficits. Symmetrical Chest wall movement, Good air movement bilaterally, CTAB RRR,No Gallops,Rubs or new Murmurs, No Parasternal Heave +ve B.Sounds, Abd Soft, No tenderness, No rebound - guarding or rigidity. No Cyanosis, Clubbing or edema, No new Rash or bruise      Data Reviewed: I have personally reviewed following labs and imaging studies  CBC: Recent Labs  Lab 01/11/21 1241 01/12/21 0122 01/13/21 0436  WBC 12.2* 8.2 8.0  NEUTROABS 9.0*  --   --   HGB 12.4* 10.2* 10.9*  HCT 38.0* 31.1* 33.8*  MCV 83.3 82.3 84.3  PLT 508* 413* 451*    Basic Metabolic Panel: Recent Labs  Lab 01/11/21 1241 01/11/21 2106 01/12/21 0122 01/13/21 0436  NA 126* 129* 129* 131*  K 3.7 3.6 3.0* 4.0  CL 91* 90* 94* 97*  CO2 24 26 25 28   GLUCOSE 123* 114* 99 91  BUN 21 18 18 15   CREATININE 1.54* 1.31* 1.29* 1.28*  CALCIUM 8.9 8.3* 8.3* 8.6*  MG  --   --   --  1.9    GFR: Estimated Creatinine Clearance: 44.6 mL/min (A) (by C-G formula based on SCr of 1.28 mg/dL (H)).  Liver Function Tests: Recent Labs  Lab 01/11/21 1241  AST 36  ALT 18  ALKPHOS 101  BILITOT 1.1  PROT 6.3*  ALBUMIN 3.1*    CBG: No results for input(s): GLUCAP in the last 168 hours.   Recent Results (from the past 240 hour(s))  Resp Panel by RT-PCR (Flu A&B, Covid) Nasopharyngeal Swab     Status: Abnormal   Collection Time: 01/11/21  2:35 PM   Specimen: Nasopharyngeal Swab; Nasopharyngeal(NP) swabs in vial transport medium  Result Value Ref Range Status   SARS Coronavirus 2 by RT PCR POSITIVE (A) NEGATIVE Final    Comment: RESULT CALLED TO, READ BACK BY AND VERIFIED WITH: Senaida Ores RN 1746 01/11/21 A BROWNING (NOTE) SARS-CoV-2 target nucleic  acids are DETECTED.  The SARS-CoV-2 RNA is generally detectable in upper respiratory specimens during the acute phase of infection. Positive results are indicative of the presence of the identified virus, but do not rule out bacterial infection or co-infection with other pathogens not detected by the test. Clinical correlation with patient history and other diagnostic information is necessary to determine patient infection status. The expected result is Negative.  Fact Sheet for Patients: EntrepreneurPulse.com.au  Fact Sheet for Healthcare Providers: IncredibleEmployment.be  This test is not yet approved or cleared by the [place] FDA and  has been authorized for detection and/or diagnosis of SARS-CoV-2 by FDA under an Emergency Use Authorization (  EUA).  This EUA will remain in effect (meaning this test can be  used) for the duration of  the COVID-19 declaration under Section 564(b)(1) of the Act, 21 U.S.C. section 360bbb-3(b)(1), unless the authorization is terminated or revoked sooner.     Influenza A by PCR NEGATIVE NEGATIVE Final   Influenza B by PCR NEGATIVE NEGATIVE Final    Comment: (NOTE) The Xpert Xpress SARS-CoV-2/FLU/RSV plus assay is intended as an aid in the diagnosis of influenza from Nasopharyngeal swab specimens and should not be used as a sole basis for treatment. Nasal washings and aspirates are unacceptable for Xpert Xpress SARS-CoV-2/FLU/RSV testing.  Fact Sheet for Patients: EntrepreneurPulse.com.au  Fact Sheet for Healthcare Providers: IncredibleEmployment.be  This test is not yet approved or cleared by the [place] FDA and has been authorized for detection and/or diagnosis of SARS-CoV-2 by FDA under an Emergency Use Authorization (EUA). This EUA will remain in effect (meaning this test can be used) for the duration of the COVID-19 declaration under Section 564(b)(1) of  the Act, 21 U.S.C. section 360bbb-3(b)(1), unless the authorization is terminated or revoked.  Performed at [facility], [place] [address] [place], [place] [address]          Radiology Studies: No results found.      Scheduled Meds: . amitriptyline  50 mg Oral QHS  . carvedilol  3.125 mg Oral BID WC  . celecoxib  200 mg Oral BID  . enoxaparin (LOVENOX) injection  40 mg Subcutaneous Q24H  . mouth rinse  15 mL Mouth Rinse BID  . oxyCODONE  10 mg Oral QID  . polyethylene glycol  34 g Oral Once  . senna-docusate  1 tablet Oral BID  . tamsulosin  0.4 mg Oral Daily   Continuous Infusions: . sodium chloride 75 mL/hr at 01/13/21 1422     LOS: 2 days       Phillips Climes, MD [facility]   To contact the attending provider between 7A-7P or the covering provider during after hours 7P-7A, please log into the web site [email or web address] and access using universal [place] password for that web site. If you do not have the password, please call the hospital operator.  01/13/2021, 3:13 PM

## 2021-01-14 ENCOUNTER — Inpatient Hospital Stay (HOSPITAL_COMMUNITY): Payer: Medicare Other

## 2021-01-14 LAB — BRAIN NATRIURETIC PEPTIDE: B Natriuretic Peptide: 148.8 pg/mL — ABNORMAL HIGH (ref 0.0–100.0)

## 2021-01-14 MED ORDER — OXYCODONE HCL ER 15 MG PO T12A
15.0000 mg | EXTENDED_RELEASE_TABLET | Freq: Two times a day (BID) | ORAL | Status: DC
  Administered 2021-01-14 – 2021-01-16 (×4): 15 mg via ORAL
  Filled 2021-01-14 (×4): qty 1

## 2021-01-14 MED ORDER — OXYCODONE HCL 5 MG PO TABS
20.0000 mg | ORAL_TABLET | Freq: Two times a day (BID) | ORAL | Status: DC
  Administered 2021-01-14: 20 mg via ORAL
  Filled 2021-01-14: qty 4

## 2021-01-14 MED ORDER — GUAIFENESIN-DM 100-10 MG/5ML PO SYRP
5.0000 mL | ORAL_SOLUTION | ORAL | Status: DC | PRN
  Administered 2021-01-14 – 2021-01-15 (×2): 5 mL via ORAL
  Filled 2021-01-14 (×2): qty 5

## 2021-01-14 NOTE — TOC Progression Note (Signed)
Transition of Care [facility]) - Progression Note    Patient Details  Name: [name] MRN: [id number] Date of Birth: [birth year]  Transition of Care [facility]) CM/SW [place], [place] Phone Number: 01/14/2021, 1:10 PM  Clinical Narrative:     CSW spoke with patient and gave SNF bed offers. Patient accepted SNF bed offer with [facility]. CSW spoke with [place] with [facility] who confirmed they can accept patient for SNF placement. Insurance authorization is pending. CSW will continue to follow and assist with discharge planning needs.      Barriers to Discharge: Continued Medical Work up  Expected Discharge Plan and Services   In-house Referral: Clinical Social Work   Post Acute Care Choice: [place] arrangements for the past 2 months: Single Family Home                                       Social Determinants of Health (SDOH) Interventions    Readmission Risk Interventions No flowsheet data found.

## 2021-01-14 NOTE — Progress Notes (Signed)
PROGRESS NOTE    [name]  SAY:[id number] DOB: [birth year] DOA: 01/11/2021 PCP: Hoyt Koch, MD    Chief Complaint  Patient presents with  . Fall    Brief Narrative:    [name] is a 87 y.o. male with medical history significant of chronic back pain, chronic peripheral neuropathy, chronic T-spine and lumbar spine fracture status post kyphoplasty of T12, L1-3, HTN, chronic diastolic CHF, BPH, presented with worsening of weakness and frequent falls.  Patient has baseline ambulation dysfunction, uses walker to ambulate.  However for the last 6 weeks or so, patient's mobilization has significantly reduced due to worsening of his back pain and neuropathy.  Suspect pain is worse when standing up and walk and relieved by lying flat.  As a result, for last 6+ weeks, patient has largely remained bedbound. "Tried all the medications, but nothing worked" And he only take oxycodone for his back pain.  Recently, 3 weeks ago, he enrolled himself in "ALIGN" program with laser beam treatment of the his neuropathy with some success.  Patient has BPH, and once a while, he will take Lasix for urination.  This morning, patient felt bladder is full, he took 80 mg of Lasix and then went to bathroom.  When he trying to stand up, he felt lightheaded and blurry vision and then fell down backward.  He then supported himself up and soon he had another episode of feeling lightheaded and then fell backward.  Denied any head injury, no loss of consciousness.  Assessment & Plan:   Active Problems:   Fall at home, initial encounter   Fall  Fall and near syncope -This is in the setting of orthostasis, from overdiuresis, volume depletion and dehydration likely in the setting of COVID infection as well.  We will multiple antihypertensive medications -Prostatic and symptomatic on presentation, this has significantly improved with IV hydration and holding his medications, I will DC his IV fluids today.   .  . -Patient was seen by PT, recommendation for SNF placement  Hyponatremia -Sodium significantly low at 126 on presentation, this is due to volume depletion, difficultly improved with IV normal saline  COVID19 infection  -There is no hypoxia, no pneumonia, he received monoclonal antibodies  HTN with orthostatic hypotension -Pressure on the lower side, continue to hold Norvasc, and Aldactone,I have lowered his Coreg to 3.125 mg p.o. twice daily .  Leukocytosis -UA negative for UTI, chest x-ray negative for acute infiltrates.  Suspect this is reactive, will monitor off antibiotics for now.  Chronic T11 fracture with chronic ambulation dysfunction -Outpatient pain management -PT evaluation.  BPH -Continue Flowmax.  CKD stage II -Cre level stable. On IVF.  Chronic pain syndrome -She is on oxycodone 10 mg 4 times daily, scheduled, he is being transitioned to long-acting, I will switch to milligram twice daily, may need to increase to 20 mg twice daily if pain remains uncontrolled.     DVT prophylaxis: Lovenox Code Status: DNR Family Communication: Discussed with wife by phone 6/3 Disposition:   Status is: Inpatient  Remains inpatient appropriate because:Persistent severe electrolyte disturbances and IV treatments appropriate due to intensity of illness or inability to take PO   Dispo: The patient is from: Home              Anticipated d/c is to: SNF              Patient currently is not medically stable to d/c.   Difficult to place patient No  Consultants:   none    Subjective:  Complains of lower back pain, denies any fever, chest pain.     Objective: Vitals:   01/14/21 0000 01/14/21 0400 01/14/21 0754 01/14/21 1135  BP: 102/64 101/60 105/62 (!) 104/54  Pulse: 84 69 79   Resp: 18 13 13    Temp: 98.1 F (36.7 C) 98 F (36.7 C) 98.4 F (36.9 C) 98.1 F (36.7 C)  TempSrc: Axillary Axillary Oral Oral  SpO2: 94% 96% 97%   Weight:       Height:        Intake/Output Summary (Last 24 hours) at 01/14/2021 1536 Last data filed at 01/14/2021 0845 Gross per 24 hour  Intake --  Output 750 ml  Net -750 ml   Filed Weights   01/11/21 2112 01/13/21 0600  Weight: 78.4 kg 78.8 kg    Examination:  Awake Alert, Oriented X 3, No new F.N deficits, Normal affect Symmetrical Chest wall movement, Good air movement bilaterally, CTAB RRR,No Gallops,Rubs or new Murmurs, No Parasternal Heave +ve B.Sounds, Abd Soft, No tenderness, No rebound - guarding or rigidity. No Cyanosis, Clubbing or edema, No new Rash or bruise        Data Reviewed: I have personally reviewed following labs and imaging studies  CBC: Recent Labs  Lab 01/11/21 1241 01/12/21 0122 01/13/21 0436  WBC 12.2* 8.2 8.0  NEUTROABS 9.0*  --   --   HGB 12.4* 10.2* 10.9*  HCT 38.0* 31.1* 33.8*  MCV 83.3 82.3 84.3  PLT 508* 413* 451*    Basic Metabolic Panel: Recent Labs  Lab 01/11/21 1241 01/11/21 2106 01/12/21 0122 01/13/21 0436  NA 126* 129* 129* 131*  K 3.7 3.6 3.0* 4.0  CL 91* 90* 94* 97*  CO2 24 26 25 28   GLUCOSE 123* 114* 99 91  BUN 21 18 18 15   CREATININE 1.54* 1.31* 1.29* 1.28*  CALCIUM 8.9 8.3* 8.3* 8.6*  MG  --   --   --  1.9    GFR: Estimated Creatinine Clearance: 44.6 mL/min (A) (by C-G formula based on SCr of 1.28 mg/dL (H)).  Liver Function Tests: Recent Labs  Lab 01/11/21 1241  AST 36  ALT 18  ALKPHOS 101  BILITOT 1.1  PROT 6.3*  ALBUMIN 3.1*    CBG: No results for input(s): GLUCAP in the last 168 hours.   Recent Results (from the past 240 hour(s))  Resp Panel by RT-PCR (Flu A&B, Covid) Nasopharyngeal Swab     Status: Abnormal   Collection Time: 01/11/21  2:35 PM   Specimen: Nasopharyngeal Swab; Nasopharyngeal(NP) swabs in vial transport medium  Result Value Ref Range Status   SARS Coronavirus 2 by RT PCR POSITIVE (A) NEGATIVE Final    Comment: RESULT CALLED TO, READ BACK BY AND VERIFIED WITH: Senaida Ores RN 1746  01/11/21 A BROWNING (NOTE) SARS-CoV-2 target nucleic acids are DETECTED.  The SARS-CoV-2 RNA is generally detectable in upper respiratory specimens during the acute phase of infection. Positive results are indicative of the presence of the identified virus, but do not rule out bacterial infection or co-infection with other pathogens not detected by the test. Clinical correlation with patient history and other diagnostic information is necessary to determine patient infection status. The expected result is Negative.  Fact Sheet for Patients: EntrepreneurPulse.com.au  Fact Sheet for Healthcare Providers: IncredibleEmployment.be  This test is not yet approved or cleared by the [place] FDA and  has been authorized for detection and/or diagnosis of SARS-CoV-2 by FDA  under an Emergency Use Authorization (EUA).  This EUA will remain in effect (meaning this test can be  used) for the duration of  the COVID-19 declaration under Section 564(b)(1) of the Act, 21 U.S.C. section 360bbb-3(b)(1), unless the authorization is terminated or revoked sooner.     Influenza A by PCR NEGATIVE NEGATIVE Final   Influenza B by PCR NEGATIVE NEGATIVE Final    Comment: (NOTE) The Xpert Xpress SARS-CoV-2/FLU/RSV plus assay is intended as an aid in the diagnosis of influenza from Nasopharyngeal swab specimens and should not be used as a sole basis for treatment. Nasal washings and aspirates are unacceptable for Xpert Xpress SARS-CoV-2/FLU/RSV testing.  Fact Sheet for Patients: EntrepreneurPulse.com.au  Fact Sheet for Healthcare Providers: IncredibleEmployment.be  This test is not yet approved or cleared by the [place] FDA and has been authorized for detection and/or diagnosis of SARS-CoV-2 by FDA under an Emergency Use Authorization (EUA). This EUA will remain in effect (meaning this test can be used) for the duration of  the COVID-19 declaration under Section 564(b)(1) of the Act, 21 U.S.C. section 360bbb-3(b)(1), unless the authorization is terminated or revoked.  Performed at [facility], [place] [address] [place], [place] [address]          Radiology Studies: DG Chest Port 1 View  Result Date: 01/14/2021 CLINICAL DATA:  Positive COVID.  Short of breath EXAM: PORTABLE CHEST 1 VIEW COMPARISON:  Radiograph 01/11/2021 FINDINGS: Posterior cervical fusion. Stable cardiac silhouette. No effusion, infiltrate or pneumothorax. IMPRESSION: No evidence of pneumonia. Electronically Signed   By: Suzy Bouchard M.D.   On: 01/14/2021 11:48        Scheduled Meds: . amitriptyline  50 mg Oral QHS  . carvedilol  3.125 mg Oral BID WC  . celecoxib  200 mg Oral BID  . enoxaparin (LOVENOX) injection  40 mg Subcutaneous Q24H  . mouth rinse  15 mL Mouth Rinse BID  . oxyCODONE  15 mg Oral Q12H  . senna-docusate  1 tablet Oral BID  . tamsulosin  0.4 mg Oral Daily   Continuous Infusions:    LOS: 3 days       Phillips Climes, MD [facility]   To contact the attending provider between 7A-7P or the covering provider during after hours 7P-7A, please log into the web site [email or web address] and access using universal [place] password for that web site. If you do not have the password, please call the hospital operator.  01/14/2021, 3:36 PM

## 2021-01-15 DIAGNOSIS — Y92009 Unspecified place in unspecified non-institutional (private) residence as the place of occurrence of the external cause: Secondary | ICD-10-CM

## 2021-01-15 LAB — CBC
HCT: 32.2 % — ABNORMAL LOW (ref 39.0–52.0)
Hemoglobin: 10.4 g/dL — ABNORMAL LOW (ref 13.0–17.0)
MCH: 27.4 pg (ref 26.0–34.0)
MCHC: 32.3 g/dL (ref 30.0–36.0)
MCV: 84.7 fL (ref 80.0–100.0)
Platelets: 535 10*3/uL — ABNORMAL HIGH (ref 150–400)
RBC: 3.8 MIL/uL — ABNORMAL LOW (ref 4.22–5.81)
RDW: 14.1 % (ref 11.5–15.5)
WBC: 10.9 10*3/uL — ABNORMAL HIGH (ref 4.0–10.5)
nRBC: 0 % (ref 0.0–0.2)

## 2021-01-15 LAB — BASIC METABOLIC PANEL WITH GFR
Anion gap: 6 (ref 5–15)
BUN: 10 mg/dL (ref 8–23)
CO2: 25 mmol/L (ref 22–32)
Calcium: 8.4 mg/dL — ABNORMAL LOW (ref 8.9–10.3)
Chloride: 100 mmol/L (ref 98–111)
Creatinine, Ser: 0.91 mg/dL (ref 0.61–1.24)
GFR, Estimated: >60 mL/min
Glucose, Bld: 100 mg/dL — ABNORMAL HIGH (ref 70–99)
Potassium: 4 mmol/L (ref 3.5–5.1)
Sodium: 131 mmol/L — ABNORMAL LOW (ref 135–145)

## 2021-01-15 MED ORDER — MAGNESIUM CITRATE PO SOLN
1.0000 | Freq: Once | ORAL | Status: AC
  Administered 2021-01-15: 1 via ORAL

## 2021-01-15 MED ORDER — OXYCODONE HCL 5 MG PO TABS
5.0000 mg | ORAL_TABLET | Freq: Four times a day (QID) | ORAL | Status: DC | PRN
  Administered 2021-01-15 – 2021-01-16 (×3): 5 mg via ORAL
  Filled 2021-01-15 (×3): qty 1

## 2021-01-15 MED ORDER — POLYETHYLENE GLYCOL 3350 17 G PO PACK
17.0000 g | PACK | Freq: Once | ORAL | Status: AC
  Administered 2021-01-15: 17 g via ORAL
  Filled 2021-01-15: qty 1

## 2021-01-15 NOTE — Progress Notes (Signed)
PROGRESS NOTE    [name]  DEY:[id number] DOB: 07/23/1932 DOA: 01/11/2021 PCP: Jordan Koch, MD    Chief Complaint  Patient presents with  . Fall    Brief Narrative:    [name] is a 87 y.o. male with medical history significant of chronic back pain, chronic peripheral neuropathy, chronic T-spine and lumbar spine fracture status post kyphoplasty of T12, L1-3, HTN, chronic diastolic CHF, BPH, presented with worsening of weakness and frequent falls.  Patient has baseline ambulation dysfunction, uses walker to ambulate.  However for the last 6 weeks or so, patient's mobilization has significantly reduced due to worsening of his back pain and neuropathy.  Suspect pain is worse when standing up and walk and relieved by lying flat.  As a result, for last 6+ weeks, patient has largely remained bedbound. "Tried all the medications, but nothing worked" And he only take oxycodone for his back pain.  Recently, 3 weeks ago, he enrolled himself in "[facility]" program with laser beam treatment of the his neuropathy with some success.  Patient has BPH, and once a while, he will take Lasix for urination.  This morning, patient felt bladder is full, he took 80 mg of Lasix and then went to bathroom.  When he trying to stand up, he felt lightheaded and blurry vision and then fell down backward.  He then supported himself up and soon he had another episode of feeling lightheaded and then fell backward.  Denied any head injury, no loss of consciousness.  Assessment & Plan:   Active Problems:   Fall at home, initial encounter   Fall  Fall and near syncope -This is in the setting of orthostasis, from overdiuresis, volume depletion and dehydration likely in the setting of COVID infection as well.  We will multiple antihypertensive medications -Prostatic and symptomatic on presentation, this has significantly improved with IV hydration and holding his medications.  . -Patient was seen by PT,  recommendation for SNF placement  Hyponatremia -Sodium significantly low at 126 on presentation, this is due to volume depletion, did improve with IV hydration.  COVID19 infection  -There is no hypoxia, no pneumonia, he received monoclonal antibodies  HTN with orthostatic hypotension -Pressure on the lower side, continue to hold Norvasc, and Aldactone,I have lowered his Coreg to 3.125 mg p.o. twice daily .  Leukocytosis -UA negative for UTI, chest x-ray negative for acute infiltrates.  Suspect this is reactive, will monitor off antibiotics for now.  Chronic T11 fracture with chronic ambulation dysfunction -Outpatient pain management -PT evaluation.  BPH -Continue Flowmax.  CKD stage II -Cre level stable. On IVF.  Chronic pain syndrome -he is on oxycodone 10 mg 4 times daily, scheduled, he is being transitioned to long-acting, he is currently on OxyContin 15 mg p.o. twice daily, reports adequate control, I have added 5 mg of oxycodone every 6 hours as needed as well.    Constipation -Will be started on good bowel regimen     DVT prophylaxis: Lovenox Code Status: DNR Family Communication: Discussed with wife by phone 6/3 Disposition:   Status is: Inpatient  Remains inpatient appropriate because:Persistent severe electrolyte disturbances and IV treatments appropriate due to intensity of illness or inability to take PO   Dispo: The patient is from: Home              Anticipated d/c is to: SNF              Patient currently is not medically stable to  d/c.   Difficult to place patient No       Consultants:   none    Subjective:  Reports lower back pain remains the same on current regimen, he still reports no bowel movement.   Objective: Vitals:   01/15/21 0400 01/15/21 0425 01/15/21 0800 01/15/21 1200  BP: (!) 92/43 (!) 105/57 (!) 114/55 103/61  Pulse: 81 80 76   Resp: 11 13 13 14   Temp: 98.3 F (36.8 C) 98.5 F (36.9 C)  98.2 F (36.8 C)   TempSrc: Axillary Axillary  Oral  SpO2: 92% 93% 93% 94%  Weight:      Height:        Intake/Output Summary (Last 24 hours) at 01/15/2021 1452 Last data filed at 01/15/2021 0813 Gross per 24 hour  Intake --  Output 625 ml  Net -625 ml   Filed Weights   01/11/21 2112 01/13/21 0600  Weight: 78.4 kg 78.8 kg    Examination:  Awake Alert, Oriented X 3, No new F.N deficits, Normal affect Symmetrical Chest wall movement, Good air movement bilaterally, CTAB RRR,No Gallops,Rubs or new Murmurs, No Parasternal Heave +ve B.Sounds, Abd Soft, No tenderness, No rebound - guarding or rigidity. No Cyanosis, Clubbing or edema, No new Rash or bruise         Data Reviewed: I have personally reviewed following labs and imaging studies  CBC: Recent Labs  Lab 01/11/21 1241 01/12/21 0122 01/13/21 0436 01/15/21 0027  WBC 12.2* 8.2 8.0 10.9*  NEUTROABS 9.0*  --   --   --   HGB 12.4* 10.2* 10.9* 10.4*  HCT 38.0* 31.1* 33.8* 32.2*  MCV 83.3 82.3 84.3 84.7  PLT 508* 413* 451* 535*    Basic Metabolic Panel: Recent Labs  Lab 01/11/21 1241 01/11/21 2106 01/12/21 0122 01/13/21 0436 01/15/21 0027  NA 126* 129* 129* 131* 131*  K 3.7 3.6 3.0* 4.0 4.0  CL 91* 90* 94* 97* 100  CO2 24 26 25 28 25   GLUCOSE 123* 114* 99 91 100*  BUN 21 18 18 15 10   CREATININE 1.54* 1.31* 1.29* 1.28* 0.91  CALCIUM 8.9 8.3* 8.3* 8.6* 8.4*  MG  --   --   --  1.9  --     GFR: Estimated Creatinine Clearance: 62.8 mL/min (by C-G formula based on SCr of 0.91 mg/dL).  Liver Function Tests: Recent Labs  Lab 01/11/21 1241  AST 36  ALT 18  ALKPHOS 101  BILITOT 1.1  PROT 6.3*  ALBUMIN 3.1*    CBG: No results for input(s): GLUCAP in the last 168 hours.   Recent Results (from the past 240 hour(s))  Resp Panel by RT-PCR (Flu A&B, Covid) Nasopharyngeal Swab     Status: Abnormal   Collection Time: 01/11/21  2:35 PM   Specimen: Nasopharyngeal Swab; Nasopharyngeal(NP) swabs in vial transport medium  Result  Value Ref Range Status   SARS Coronavirus 2 by RT PCR POSITIVE (A) NEGATIVE Final    Comment: RESULT CALLED TO, READ BACK BY AND VERIFIED WITH: Senaida Ores RN [id number] 01/11/21 A BROWNING (NOTE) SARS-CoV-2 target nucleic acids are DETECTED.  The SARS-CoV-2 RNA is generally detectable in upper respiratory specimens during the acute phase of infection. Positive results are indicative of the presence of the identified virus, but do not rule out bacterial infection or co-infection with other pathogens not detected by the test. Clinical correlation with patient history and other diagnostic information is necessary to determine patient infection status. The expected result is Negative.  Fact Sheet for Patients: EntrepreneurPulse.com.au  Fact Sheet for Healthcare Providers: IncredibleEmployment.be  This test is not yet approved or cleared by the [place] FDA and  has been authorized for detection and/or diagnosis of SARS-CoV-2 by FDA under an Emergency Use Authorization (EUA).  This EUA will remain in effect (meaning this test can be  used) for the duration of  the COVID-19 declaration under Section 564(b)(1) of the Act, 21 U.S.C. section 360bbb-3(b)(1), unless the authorization is terminated or revoked sooner.     Influenza A by PCR NEGATIVE NEGATIVE Final   Influenza B by PCR NEGATIVE NEGATIVE Final    Comment: (NOTE) The Xpert Xpress SARS-CoV-2/FLU/RSV plus assay is intended as an aid in the diagnosis of influenza from Nasopharyngeal swab specimens and should not be used as a sole basis for treatment. Nasal washings and aspirates are unacceptable for Xpert Xpress SARS-CoV-2/FLU/RSV testing.  Fact Sheet for Patients: EntrepreneurPulse.com.au  Fact Sheet for Healthcare Providers: IncredibleEmployment.be  This test is not yet approved or cleared by the [place] FDA and has been authorized for detection and/or  diagnosis of SARS-CoV-2 by FDA under an Emergency Use Authorization (EUA). This EUA will remain in effect (meaning this test can be used) for the duration of the COVID-19 declaration under Section 564(b)(1) of the Act, 21 U.S.C. section 360bbb-3(b)(1), unless the authorization is terminated or revoked.  Performed at [facility], [place] [address] [place], [place] [address]          Radiology Studies: DG Chest Port 1 View  Result Date: 01/14/2021 CLINICAL DATA:  Positive COVID.  Short of breath EXAM: PORTABLE CHEST 1 VIEW COMPARISON:  Radiograph 01/11/2021 FINDINGS: Posterior cervical fusion. Stable cardiac silhouette. No effusion, infiltrate or pneumothorax. IMPRESSION: No evidence of pneumonia. Electronically Signed   By: Suzy Bouchard M.D.   On: 01/14/2021 11:48        Scheduled Meds: . amitriptyline  50 mg Oral QHS  . carvedilol  3.125 mg Oral BID WC  . celecoxib  200 mg Oral BID  . enoxaparin (LOVENOX) injection  40 mg Subcutaneous Q24H  . mouth rinse  15 mL Mouth Rinse BID  . oxyCODONE  15 mg Oral Q12H  . senna-docusate  1 tablet Oral BID  . tamsulosin  0.4 mg Oral Daily   Continuous Infusions:    LOS: 4 days       Phillips Climes, MD [facility]   To contact the attending provider between 7A-7P or the covering provider during after hours 7P-7A, please log into the web site [email or web address] and access using universal [place] password for that web site. If you do not have the password, please call the hospital operator.  01/15/2021, 2:52 PM

## 2021-01-16 MED ORDER — MIDODRINE HCL 5 MG PO TABS
5.0000 mg | ORAL_TABLET | Freq: Three times a day (TID) | ORAL | Status: DC
  Administered 2021-01-16 – 2021-01-17 (×4): 5 mg via ORAL
  Filled 2021-01-16 (×4): qty 1

## 2021-01-16 MED ORDER — OXYCODONE HCL ER 10 MG PO T12A
20.0000 mg | EXTENDED_RELEASE_TABLET | Freq: Two times a day (BID) | ORAL | Status: DC
  Administered 2021-01-16 – 2021-01-17 (×3): 20 mg via ORAL
  Filled 2021-01-16 (×3): qty 2

## 2021-01-16 MED ORDER — SODIUM CHLORIDE 0.9 % IV SOLN: INTRAVENOUS | Status: DC

## 2021-01-16 MED ORDER — OXYCODONE HCL 5 MG PO TABS
5.0000 mg | ORAL_TABLET | ORAL | Status: DC | PRN
  Administered 2021-01-16 – 2021-01-17 (×3): 5 mg via ORAL
  Filled 2021-01-16 (×3): qty 1

## 2021-01-16 NOTE — Progress Notes (Signed)
Physical Therapy Treatment Patient Details Name: [name] MRN: [id number] DOB: [birth year] Today's Date: 01/16/2021    History of Present Illness 87yo male admitted 01/11/21 due to weakness, frequent falls, ongoing back pain, and some near syncopal episodes. Has been bed bound for the past 6 weeks secondary to back pain. Found to be Covid positive. PMH CAD, carpal tunnel s/p release, CHF, chronic back and neck pain, hx DVT, gout, HLD, HTN, kyphoplasty, TKA, patella ORIF, lumbar and cervical fusions    PT Comments    Patient received in bed, pleasant and seems to be a bit more clear cognitively today- but did need cues/leading questions to determine that he tested positive for covid. Tolerated significantly more mobility today, and able to perform partial sit to stand at EOB as well as lateral scoots along edge of bed and several bed level exercises today (SAQs, ankle pumps, bridges, glute squeezes). Did have a bit of a drop in BP but asymptomatic. Somewhat limited by back pain but very motivated to participate and try new tasks today- states "I need to be able to move and get better" . Left in bed positioned to comfort with HOB at 30 degrees and VSS, educated on importance of not laying flat to help reduce risk of orthostatic hypotension. Continue to recommend skilled PT services in SNF setting moving forward.  Supine 113/55   Sitting 86/56 to 88/68    Follow Up Recommendations  SNF;Supervision/Assistance - 24 hour     Equipment Recommendations  Rolling walker with 5" wheels;3in1 (PT);Wheelchair (measurements PT);Wheelchair cushion (measurements PT)    Recommendations for Other Services       Precautions / Restrictions Precautions Precautions: Fall Precaution Comments: chronic back pain, multiple kyphoplasties (most recent January 2022), back precautions for comfort Restrictions Weight Bearing Restrictions: No    Mobility  Bed Mobility Overal bed mobility: Needs Assistance Bed  Mobility: Rolling;Sidelying to Sit;Sit to Sidelying Rolling: Modified independent (Device/Increase time) Sidelying to sit: Min assist     Sit to sidelying: Min assist General bed mobility comments: able to roll with Mod(I) with use of railing, continues to require MinA to boost to sitting midline at EOB and to control descent/technique with return to supine    Transfers Overall transfer level: Needs assistance Equipment used: Rolling walker (2 wheeled) Transfers: Sit to/from Stand Sit to Stand: Mod assist         General transfer comment: able to perform partial sit to stand- only able to raise hips half way up to full standing but limited by pain  Ambulation/Gait             General Gait Details: unable   Stairs             Wheelchair Mobility    Modified Rankin (Stroke Patients Only)       Balance Overall balance assessment: Needs assistance Sitting-balance support: Feet supported;Single extremity supported Sitting balance-Leahy Scale: Fair Sitting balance - Comments: BUE support and light min guard for safety, much more stable   Standing balance support: Bilateral upper extremity supported;During functional activity Standing balance-Leahy Scale: Zero Standing balance comment: uanble to get all the way up to standing, pain. Reliant on BUE support                            Cognition Arousal/Alertness: Awake/alert Behavior During Therapy: WFL for tasks assessed/performed;Flat affect Overall Cognitive Status: No family/caregiver present to determine baseline cognitive functioning Area of Impairment:  Awareness;Safety/judgement                 Orientation Level: Disoriented to;Situation Current Attention Level: Selective Memory: Decreased short-term memory Following Commands: Follows one step commands consistently;Follows one step commands with increased time Safety/Judgement: Decreased awareness of safety Awareness:  Intellectual Problem Solving: Slow processing;Difficulty sequencing;Requires verbal cues General Comments: somewhat more alert and sharp than at initial PT eval- but did need leading questions to tell that he has tested positive for covid. Still needs increased time to process/follow cues and commands but much more motivated today      Exercises      General Comments        Pertinent Vitals/Pain Pain Assessment: Faces Faces Pain Scale: Hurts even more Pain Location: back, generalized with activity Pain Descriptors / Indicators: Aching;Sore;Guarding;Constant;Grimacing Pain Intervention(s): Limited activity within patient's tolerance;Monitored during session    Home Living                      Prior Function            PT Goals (current goals can now be found in the care plan section) Acute Rehab PT Goals Patient Stated Goal: for pain to be better PT Goal Formulation: With patient Time For Goal Achievement: 01/26/21 Potential to Achieve Goals: Fair Progress towards PT goals: Progressing toward goals    Frequency    Min 2X/week      PT Plan Current plan remains appropriate    Co-evaluation              AM-PAC PT "6 Clicks" Mobility   Outcome Measure  Help needed turning from your back to your side while in a flat bed without using bedrails?: None Help needed moving from lying on your back to sitting on the side of a flat bed without using bedrails?: A Little Help needed moving to and from a bed to a chair (including a wheelchair)?: Total Help needed standing up from a chair using your arms (e.g., wheelchair or bedside chair)?: Total Help needed to walk in hospital room?: Total Help needed climbing 3-5 steps with a railing? : Total 6 Click Score: 11    End of Session   Activity Tolerance: Patient limited by pain Patient left: in bed;with call bell/phone within reach;with bed alarm set Nurse Communication: Mobility status PT Visit Diagnosis:  Unsteadiness on feet (R26.81);Difficulty in walking, not elsewhere classified (R26.2);Muscle weakness (generalized) (M62.81);Other abnormalities of gait and mobility (R26.89);Pain Pain - Right/Left:  (midline) Pain - part of body:  (back pain)     Time: 9371-6967 PT Time Calculation (min) (ACUTE ONLY): 28 min  Charges:  $Therapeutic Activity: 23-37 mins                     Windell Norfolk, DPT, PN1   Supplemental Physical Therapist [place]    Pager [phone] Acute Rehab Office [phone]

## 2021-01-16 NOTE — Progress Notes (Signed)
   01/16/21 0414  Assess: MEWS Score  Temp 98.1 F (36.7 C)  BP (!) 99/54  Pulse Rate 69  ECG Heart Rate 67  Resp 13  SpO2 93 %  O2 Device Room Air  Assess: MEWS Score  MEWS Temp 0  MEWS Systolic 1  MEWS Pulse 0  MEWS RR 1  MEWS LOC 0  MEWS Score 2  MEWS Score Color Yellow  Assess: if the MEWS score is Yellow or Red  Were vital signs taken at a resting state? Yes  Focused Assessment No change from prior assessment  Early Detection of Sepsis Score *See Row Information* Low  MEWS guidelines implemented *See Row Information* Yes  Treat  MEWS Interventions Escalated (See documentation below)  Pain Scale 0-10  Pain Score 7  Pain Type Acute pain  Pain Location Back  Pain Orientation Lower  Pain Descriptors / Indicators Aching  Pain Frequency Intermittent  Pain Onset Gradual  Patients Stated Pain Goal 7  Pain Intervention(s) Repositioned  Take Vital Signs  Increase Vital Sign Frequency  Yellow: Q 2hr X 2 then Q 4hr X 2, if remains yellow, continue Q 4hrs  Escalate  MEWS: Escalate Yellow: discuss with charge nurse/RN and consider discussing with provider and RRT  Notify: Charge Nurse/RN  Name of Charge Nurse/RN Notified Nikki, RN  Date Charge Nurse/RN Notified 01/16/21  Time Charge Nurse/RN Notified 0414  Document  Patient Outcome Stabilized after interventions  Progress note created (see row info) Yes

## 2021-01-16 NOTE — Progress Notes (Signed)
PROGRESS NOTE    [name]  XVQ:[id number] DOB: 15-Oct-1931 DOA: 01/11/2021 PCP: Hoyt Koch, MD    Chief Complaint  Patient presents with  . Fall    Brief Narrative:   [name] is a 87 y.o. male with medical history significant of chronic back pain, chronic peripheral neuropathy, chronic T-spine and lumbar spine fracture status post kyphoplasty of T12, L1-3, HTN, chronic diastolic CHF, BPH, presented with worsening of weakness and frequent falls.  Patient has baseline ambulation dysfunction, uses walker to ambulate.  However for the last 6 weeks or so, patient's mobilization has significantly reduced due to worsening of his back pain and neuropathy.  Suspect pain is worse when standing up and walk and relieved by lying flat.  As a result, for last 6+ weeks, patient has largely remained bedbound. "Tried all the medications, but nothing worked" And he only take oxycodone for his back pain.  Recently, 3 weeks ago, he enrolled himself in "ALIGN" program with laser beam treatment of the his neuropathy with some success.  Patient has BPH, and once a while, he will take Lasix for urination.  This morning, patient felt bladder is full, he took 80 mg of Lasix and then went to bathroom.  When he trying to stand up, he felt lightheaded and blurry vision and then fell down backward.  He then supported himself up and soon he had another episode of feeling lightheaded and then fell backward.  Denied any head injury, no loss of consciousness.  Assessment & Plan:   Active Problems:   Fall at home, initial encounter   Fall  Fall and near syncope -This is in the setting of orthostasis, from overdiuresis, volume depletion and dehydration likely in the setting of COVID infection as well.  We will multiple antihypertensive medications -Orthostatic and symptomatic on presentation, this has significantly improved with IV hydration and holding his medications.  . -Patient was seen by PT,  recommendation for SNF placement  Hyponatremia -Sodium significantly low at 126 on presentation, this is due to volume depletion, did improve with IV hydration.  COVID19 infection  -There is no hypoxia, no pneumonia, he received monoclonal antibodies, he was encouraged to keep using incentive spirometer.  HTN with orthostatic hypotension -Blood pressure on the lower side initially, this has significantly improved with holding Norvasc, Aldactone and Coreg. -Moved with IV fluids . -This morning he is with mild orthostasis, but asymptomatic . -We will start on low-dose midodrine .  Leukocytosis -UA negative for UTI, chest x-ray negative for acute infiltrates.  Suspect this is reactive, will monitor off antibiotics for now.  chronic lower back pain, with multiple vertebral fracture s/p kyphoplasty including T11, L1/L2 . -Continue with PT/OT, please see pain medicine discussion below   BPH -Continue Flowmax.  CKD stage II -Cre level stable. On IVF.  Chronic pain syndrome -Patient on oxycodone 10 mg 4 times daily, he is being gradually transition to long-acting OxyContin, will increase to 20 mg p.o. twice daily, continue with oxycodone 5 mg every 4 hours as needed, and will adjust OxyContin dose pending his as needed oxycodone requirement .  Constipation -improved with magnesium citrate    DVT prophylaxis: Lovenox Code Status: DNR Family Communication: None at bedside Disposition:   Status is: Inpatient  Remains inpatient appropriate because:Persistent severe electrolyte disturbances and IV treatments appropriate due to intensity of illness or inability to take PO   Dispo: The patient is from: Home  Anticipated d/c is to: SNF              Patient currently is not medically stable to d/c.  Patient accepted to SNF after peer to peer today.   Difficult to place patient No       Consultants:   none    Subjective:  Reports his lower back pain has  improved, reports good bowel movement yesterday.   Objective: Vitals:   01/16/21 0414 01/16/21 0744 01/16/21 0923 01/16/21 1204  BP: (!) 99/54 (!) 98/54 (!) 106/56 115/67  Pulse: 69 83  84  Resp: 13 18  18   Temp: 98.1 F (36.7 C) 98.1 F (36.7 C)  98.7 F (37.1 C)  TempSrc: Oral Axillary  Axillary  SpO2: 93% 95%  100%  Weight:      Height:        Intake/Output Summary (Last 24 hours) at 01/16/2021 1421 Last data filed at 01/15/2021 1614 Gross per 24 hour  Intake 120 ml  Output --  Net 120 ml   Filed Weights   01/11/21 2112 01/13/21 0600  Weight: 78.4 kg 78.8 kg    Examination:  Awake Alert, Oriented X 3, No new F.N deficits, Normal affect Symmetrical Chest wall movement, Good air movement bilaterally, CTAB RRR,No Gallops,Rubs or new Murmurs, No Parasternal Heave +ve B.Sounds, Abd Soft, No tenderness, No rebound - guarding or rigidity. No Cyanosis, Clubbing or edema, No new Rash or bruise          Data Reviewed: I have personally reviewed following labs and imaging studies  CBC: Recent Labs  Lab 01/11/21 1241 01/12/21 0122 01/13/21 0436 01/15/21 0027  WBC 12.2* 8.2 8.0 10.9*  NEUTROABS 9.0*  --   --   --   HGB 12.4* 10.2* 10.9* 10.4*  HCT 38.0* 31.1* 33.8* 32.2*  MCV 83.3 82.3 84.3 84.7  PLT 508* 413* 451* 535*    Basic Metabolic Panel: Recent Labs  Lab 01/11/21 1241 01/11/21 2106 01/12/21 0122 01/13/21 0436 01/15/21 0027  NA 126* 129* 129* 131* 131*  K 3.7 3.6 3.0* 4.0 4.0  CL 91* 90* 94* 97* 100  CO2 24 26 25 28 25   GLUCOSE 123* 114* 99 91 100*  BUN 21 18 18 15 10   CREATININE 1.54* 1.31* 1.29* 1.28* 0.91  CALCIUM 8.9 8.3* 8.3* 8.6* 8.4*  MG  --   --   --  1.9  --     GFR: Estimated Creatinine Clearance: 62.8 mL/min (by C-G formula based on SCr of 0.91 mg/dL).  Liver Function Tests: Recent Labs  Lab 01/11/21 1241  AST 36  ALT 18  ALKPHOS 101  BILITOT 1.1  PROT 6.3*  ALBUMIN 3.1*    CBG: No results for input(s): GLUCAP in the  last 168 hours.   Recent Results (from the past 240 hour(s))  Resp Panel by RT-PCR (Flu A&B, Covid) Nasopharyngeal Swab     Status: Abnormal   Collection Time: 01/11/21  2:35 PM   Specimen: Nasopharyngeal Swab; Nasopharyngeal(NP) swabs in vial transport medium  Result Value Ref Range Status   SARS Coronavirus 2 by RT PCR POSITIVE (A) NEGATIVE Final    Comment: RESULT CALLED TO, READ BACK BY AND VERIFIED WITH: Senaida Ores RN 1746 01/11/21 A BROWNING (NOTE) SARS-CoV-2 target nucleic acids are DETECTED.  The SARS-CoV-2 RNA is generally detectable in upper respiratory specimens during the acute phase of infection. Positive results are indicative of the presence of the identified virus, but do not rule out bacterial infection  or co-infection with other pathogens not detected by the test. Clinical correlation with patient history and other diagnostic information is necessary to determine patient infection status. The expected result is Negative.  Fact Sheet for Patients: EntrepreneurPulse.com.au  Fact Sheet for Healthcare Providers: IncredibleEmployment.be  This test is not yet approved or cleared by the [place] FDA and  has been authorized for detection and/or diagnosis of SARS-CoV-2 by FDA under an Emergency Use Authorization (EUA).  This EUA will remain in effect (meaning this test can be  used) for the duration of  the COVID-19 declaration under Section 564(b)(1) of the Act, 21 U.S.C. section 360bbb-3(b)(1), unless the authorization is terminated or revoked sooner.     Influenza A by PCR NEGATIVE NEGATIVE Final   Influenza B by PCR NEGATIVE NEGATIVE Final    Comment: (NOTE) The Xpert Xpress SARS-CoV-2/FLU/RSV plus assay is intended as an aid in the diagnosis of influenza from Nasopharyngeal swab specimens and should not be used as a sole basis for treatment. Nasal washings and aspirates are unacceptable for Xpert Xpress  SARS-CoV-2/FLU/RSV testing.  Fact Sheet for Patients: EntrepreneurPulse.com.au  Fact Sheet for Healthcare Providers: IncredibleEmployment.be  This test is not yet approved or cleared by the [place] FDA and has been authorized for detection and/or diagnosis of SARS-CoV-2 by FDA under an Emergency Use Authorization (EUA). This EUA will remain in effect (meaning this test can be used) for the duration of the COVID-19 declaration under Section 564(b)(1) of the Act, 21 U.S.C. section 360bbb-3(b)(1), unless the authorization is terminated or revoked.  Performed at [facility], [place] [address] [place], [place] [address]          Radiology Studies: No results found.      Scheduled Meds: . amitriptyline  50 mg Oral QHS  . celecoxib  200 mg Oral BID  . enoxaparin (LOVENOX) injection  40 mg Subcutaneous Q24H  . mouth rinse  15 mL Mouth Rinse BID  . midodrine  5 mg Oral TID WC  . oxyCODONE  20 mg Oral Q12H  . senna-docusate  1 tablet Oral BID  . tamsulosin  0.4 mg Oral Daily   Continuous Infusions: . sodium chloride 75 mL/hr at 01/16/21 1258     LOS: 5 days       Phillips Climes, MD [facility]   To contact the attending provider between 7A-7P or the covering provider during after hours 7P-7A, please log into the web site [email or web address] and access using universal [place] password for that web site. If you do not have the password, please call the hospital operator.  01/16/2021, 2:21 PM

## 2021-01-16 NOTE — Progress Notes (Signed)
Pt mobilized to chair and tolerated sitting up for 3 hrs with pain controlled.

## 2021-01-16 NOTE — TOC Progression Note (Addendum)
Transition of Care [facility]) - Progression Note    Patient Details  Name: [name] MRN: [id number] Date of Birth: 1931-08-28  Transition of Care [facility]) CM/SW [place], LCSW Phone Number: 01/16/2021, 10:03 AM  Clinical Narrative:    10am-CSW contacted [employer] to check status of SNF authorization.They are requesting a peer to peer: [phone], extension 5. Due by 12pm today. CSW requested therapy work with patient today as well.   2:48pm-CSW notified patient's spouse that per MD he can discharge tomorrow to SNF (peer to peer completed).  Approval detailsGeryl Councilman, Ref #[id number], effective 01/16/21-01/18/21.        Barriers to Discharge: Continued Medical Work up  Expected Discharge Plan and Services   In-house Referral: Clinical Social Work   Post Acute Care Choice: [place] arrangements for the past 2 months: Single Family Home                                       Social Determinants of Health (SDOH) Interventions    Readmission Risk Interventions No flowsheet data found.

## 2021-01-17 MED ORDER — HYDROCORTISONE 1 % EX LOTN
TOPICAL_LOTION | Freq: Two times a day (BID) | CUTANEOUS | Status: DC
  Filled 2021-01-17: qty 118

## 2021-01-17 MED ORDER — HYDROCORTISONE 1 % EX CREA
TOPICAL_CREAM | Freq: Two times a day (BID) | CUTANEOUS | Status: DC
  Filled 2021-01-17: qty 28

## 2021-01-17 MED ORDER — SENNOSIDES-DOCUSATE SODIUM 8.6-50 MG PO TABS: 2.0000 | ORAL_TABLET | Freq: Two times a day (BID) | ORAL | Status: DC

## 2021-01-17 MED ORDER — ONDANSETRON HCL 4 MG PO TABS: 4.0000 mg | ORAL_TABLET | Freq: Every day | ORAL | 1 refills | Status: DC | PRN

## 2021-01-17 MED ORDER — OXYCODONE HCL 5 MG PO TABS: 5.0000 mg | ORAL_TABLET | ORAL | 0 refills | Status: DC | PRN

## 2021-01-17 MED ORDER — MIDODRINE HCL 5 MG PO TABS: 5.0000 mg | ORAL_TABLET | Freq: Two times a day (BID) | ORAL | Status: DC

## 2021-01-17 MED ORDER — OXYCODONE HCL ER 20 MG PO T12A: 20.0000 mg | EXTENDED_RELEASE_TABLET | Freq: Two times a day (BID) | ORAL | 0 refills | Status: AC

## 2021-01-17 NOTE — Discharge Instructions (Signed)
Follow with Primary MD Hoyt Koch, MD  Or SNF Physician.  Get CBC, CMP,  checked  by Primary MD next visit.    Activity: As tolerated with Full fall precautions use walker/cane & assistance as needed   Disposition Home    Diet: Regular diet   On your next visit with your primary care physician please Get Medicines reviewed and adjusted.   Please request your Prim.MD to go over all Hospital Tests and Procedure/Radiological results at the follow up, please get all Hospital records sent to your Prim MD by signing hospital release before you go home.   If you experience worsening of your admission symptoms, develop shortness of breath, life threatening emergency, suicidal or homicidal thoughts you must seek medical attention immediately by calling 911 or calling your MD immediately  if symptoms less severe.  You Must read complete instructions/literature along with all the possible adverse reactions/side effects for all the Medicines you take and that have been prescribed to you. Take any new Medicines after you have completely understood and accpet all the possible adverse reactions/side effects.   Do not drive, operating heavy machinery, perform activities at heights, swimming or participation in water activities or provide baby sitting services if your were admitted for syncope or siezures until you have seen by Primary MD or a Neurologist and advised to do so again.  Do not drive when taking Pain medications.    Do not take more than prescribed Pain, Sleep and Anxiety Medications  Special Instructions: If you have smoked or chewed Tobacco  in the last 2 yrs please stop smoking, stop any regular Alcohol  and or any Recreational drug use.  Wear Seat belts while driving.   Please note  You were cared for by a hospitalist during your hospital stay. If you have any questions about your discharge medications or the care you received while you were in the hospital after you  are discharged, you can call the unit and asked to speak with the hospitalist on call if the hospitalist that took care of you is not available. Once you are discharged, your primary care physician will handle any further medical issues. Please note that NO REFILLS for any discharge medications will be authorized once you are discharged, as it is imperative that you return to your primary care physician (or establish a relationship with a primary care physician if you do not have one) for your aftercare needs so that they can reassess your need for medications and monitor your lab values.

## 2021-01-17 NOTE — TOC Progression Note (Addendum)
Transition of Care [facility]) - Progression Note    Patient Details  Name: LEDELL CODRINGTON MRN: [id number] Date of Birth: April 10, 1932  Transition of Care [facility]) CM/SW [place], LCSW Phone Number: 01/17/2021, 2:15 PM  Clinical Narrative:    2:15pm-CSW notified that when RN called [facility] to provide report, they told RN that they can no longer accept COVID + patients. CSW spoke with their admission coordinator, Kenney Houseman, and emphasized that this has been set up since last Friday. She apologized and said they are not able to take patient. CSW contacted [facility] to see if they could accept patient. They can accept patient but are waiting to hear back from insurance to switch the facility choice. CSW updated patient and he is agreeable to the plan change.   4:35pm-CSW received insurance approval for [facility]: Ref# [id number], effective 01/17/21-01/19/21. Will arrange PTAR.    Expected Discharge Plan: Skilled Nursing Facility Barriers to Discharge: Barriers Resolved  Expected Discharge Plan and Services Expected Discharge Plan: [place] In-house Referral: Clinical Social Work   Post Acute Care Choice: [place] Living arrangements for the past 2 months: Single Family Home Expected Discharge Date: 01/17/21                                     Social Determinants of Health (SDOH) Interventions    Readmission Risk Interventions No flowsheet data found.

## 2021-01-17 NOTE — TOC Transition Note (Signed)
Transition of Care [facility]) - CM/SW Discharge Note   Patient Details  Name: [name] MRN: [id number] Date of Birth: 10-Oct-1931  Transition of Care [facility]) CM/SW Contact:  Benard Halsted, LCSW Phone Number: 01/17/2021, 4:40 PM   Clinical Narrative:    Patient will DC to: [facility] Anticipated DC date: 01/17/21 Family notified: Jana Half, wife Transport by: Corey Harold  Per MD patient ready for DC to [facility]. RN to call report prior to discharge([phone]). RN, patient, patient's family, and facility notified of DC. Discharge Summary and FL2 sent to facility. DC packet on chart. Ambulance transport requested for patient.   CSW will sign off for now as social work intervention is no longer needed. Please consult [place] again if new needs arise.   Final next level of care: Skilled Nursing Facility Barriers to Discharge: Barriers Resolved   Patient Goals and CMS Choice Patient states their goals for this hospitalization and ongoing recovery are:: Rehab CMS Medicare.gov Compare Post Acute Care list provided to:: Patient Choice offered to / list presented to : Patient  Discharge Placement   Existing PASRR number confirmed : 01/17/21          Patient chooses bed at: [facility] Patient to be transferred to facility by: [place] Name of family member notified: Wife Patient and family notified of of transfer: 01/17/21  Discharge Plan and Services In-house Referral: Clinical Social Work   Post Acute Care Choice: [place]                               Social Determinants of Health (SDOH) Interventions     Readmission Risk Interventions No flowsheet data found.

## 2021-01-17 NOTE — Progress Notes (Signed)
Report called to [facility], spoke with [place], at this time.

## 2021-01-17 NOTE — Plan of Care (Signed)

## 2021-01-17 NOTE — TOC Transition Note (Signed)
Transition of Care [facility]) - CM/SW Discharge Note   Patient Details  Name: [name] MRN: [id number] Date of Birth: October 07, 1931  Transition of Care [facility]) CM/SW Contact:  Benard Halsted, LCSW Phone Number: 01/17/2021, 11:04 AM   Clinical Narrative:    Patient will DC to: [facility] Anticipated DC date: 01/17/21 Family notified: Jana Half, wife Transport by: Corey Harold   Per MD patient ready for DC to [facility]. RN to call report prior to discharge ([phone]). RN, patient, patient's family, and facility notified of DC. Discharge Summary and FL2 sent to facility. DC packet on chart. Ambulance transport requested for patient.   CSW will sign off for now as social work intervention is no longer needed. Please consult [place] again if new needs arise.      Final next level of care: Skilled Nursing Facility Barriers to Discharge: Barriers Resolved   Patient Goals and CMS Choice Patient states their goals for this hospitalization and ongoing recovery are:: Rehab CMS Medicare.gov Compare Post Acute Care list provided to:: Patient Choice offered to / list presented to : Patient  Discharge Placement   Existing PASRR number confirmed : 01/17/21          Patient chooses bed at: [facility] Patient to be transferred to facility by: [facility] Name of family member notified: Wife Patient and family notified of of transfer: 01/17/21  Discharge Plan and Services In-house Referral: Clinical Social Work   Post Acute Care Choice: [place]                               Social Determinants of Health (SDOH) Interventions     Readmission Risk Interventions No flowsheet data found.

## 2021-01-17 NOTE — Discharge Summary (Addendum)
Physician Discharge Summary  BUTLER VEGH ZOX:[id number] DOB: [birth year] DOA: 01/11/2021  PCP: Hoyt Koch, MD  Admit date: 01/11/2021 Discharge date: 01/17/2021  Admitted From: Home Disposition:  SNF   Recommendations for Outpatient Follow-up:  1. Antihypertensive regimen has been stopped, started on midodrine for soft blood pressure, his multiple pressure closely and adjust medications as needed 2. He is currently off Lasix, no evidence of volume overload, this can be resumed if needed 3. Please try to medicate patient with as needed codon before PT therapy sessions, adjust his pain regimen as needed, please see discussion below 4. Monitor closely for constipation and give as needed medications  Home Health:NO Equipment/Devices:NO  Discharge Condition:Stable CODE STATUS:DNR,  Diet recommendation:  Regular  HPI on admission 01/11/2021:  [name] a 87 y.o.malewith medical history significant ofchronic back pain,chronic peripheral neuropathy, chronic T-spine and lumbar spine fracture status post kyphoplasty of T12, L1-3, HTN,chronic diastolic CHF, BPH, presented with worsening of weakness and frequent falls.  Patient has baseline ambulation dysfunction, uses walker to ambulate. However for the last 6 weeks or so, patient's mobilization has significantly reduced due to worsening of his back pain and neuropathy. Suspect pain is worse when standing up and walk and relieved by lying flat. As a result,for last 6+ weeks, patient has largely remained bedbound. "Tried all the medications, but nothing worked" And he only takeoxycodone for his back pain. Recently, 3 weeks ago, he enrolled himself in "ALIGN" program with laser beam treatment of the his neuropathy with some success.  Patient has BPH, and once a while, he will take Lasix for urination. This morning, patient feltbladder is full, he took 80 mg of Lasix and then went to bathroom. When he trying to stand up,  he felt lightheaded and blurry vision and then fell down backward. He then supported himself up and soon he had another episode of feeling lightheaded and then fell backward. Denied any head injury, no loss of consciousness  Discharge Diagnoses:  Active Problems:   Essential hypertension   Benign prostatic hyperplasia   Chronic diastolic CHF (congestive heart failure) ([place])   Fall at home, initial encounter   Fall    Falland near syncope -This is in the setting of orthostasis, from  volume depletion and dehydration likely in the setting of COVID infection as well.  We will multiple antihypertensive medications -Orthostatic and symptomatic on presentation, this has significantly improved with IV hydration and holding his antihypertensive medications.  . -Patient was seen by PT, recommendation for SNF placement -I have discontinued his Lasix to avoid any more overdiuresis, patient report he was taking it as needed to start with at home.  Hyponatremia -Sodium significantly low at 126 on presentation, this is due to volume depletion, did improve with IV NS, most recent sodium 131.  COVID19 infection  -There is no hypoxia, no pneumonia, he received monoclonal antibodies, he was encouraged to keep using incentive spirometer. -Patient with some intermittent dyspnea during hospital stay, but this is most likely due to atelectasis, this has resolved with incentive spirometer  HTN withorthostatic hypotension -Blood pressure on the lower side initially, as well he was orthostatic, on multiple antihypertensive medication including Norvasc, Coreg, Aldactone and Lasix, all these meds has been stopped, blood pressure limit on the soft side and he was orthostatic, he was darted on midodrine with good improvement . -All of his medications will be discontinued on discharge, he will be continued on midodrine . -Continue with TED hose as well .  Leukocytosis -UA negative for UTI, chest x-ray  negative for acute infiltrates. No evidence of infection.  chronic lower back pain, with multiple vertebral fracture s/p kyphoplasty including T11, L1/L2 . -Continue with PT/OT, please see pain medicine discussion below  Chronic pain syndrome -Patient with significant lower back pain, this is secondary to his chronic T11/L1/L2 fractures, status post kyphoplasty, he is on oxycodone 10 mg 4 times daily scheduled at baseline at home, his regimen has been adjusted, he was started on long-acting oxycodone, he will be discharged on OxyContin 20 mg oral twice daily, and will add as needed oxycodone 5 mg every 6 hours as needed for better control, especially he is about to do some activity and do physical therapy, this need to be monitored closely and adjusted as needed.  BPH -Continue Flowmax.  CKD stage II -Cre level stable. On IVF.  Chronic diastolic CHF -He is dry on presentation, he is euvolemic on discharge, Lasix has been held on discharge, resume if needed.   Constipation -improved with magnesium citrate  Discharge Instructions  Discharge Instructions    Discharge instructions   Complete by: As directed    Follow with Primary MD Hoyt Koch, MD  or SNF Physician.  Get CBC, CMP,  checked  by Primary MD next visit.    Activity: As tolerated with Full fall precautions use walker/cane & assistance as needed   Disposition Home    Diet: Regular diet   On your next visit with your primary care physician please Get Medicines reviewed and adjusted.   Please request your Prim.MD to go over all Hospital Tests and Procedure/Radiological results at the follow up, please get all Hospital records sent to your Prim MD by signing hospital release before you go home.   If you experience worsening of your admission symptoms, develop shortness of breath, life threatening emergency, suicidal or homicidal thoughts you must seek medical attention immediately by calling 911 or  calling your MD immediately  if symptoms less severe.  You Must read complete instructions/literature along with all the possible adverse reactions/side effects for all the Medicines you take and that have been prescribed to you. Take any new Medicines after you have completely understood and accpet all the possible adverse reactions/side effects.   Do not drive, operating heavy machinery, perform activities at heights, swimming or participation in water activities or provide baby sitting services if your were admitted for syncope or siezures until you have seen by Primary MD or a Neurologist and advised to do so again.  Do not drive when taking Pain medications.    Do not take more than prescribed Pain, Sleep and Anxiety Medications  Special Instructions: If you have smoked or chewed Tobacco  in the last 2 yrs please stop smoking, stop any regular Alcohol  and or any Recreational drug use.  Wear Seat belts while driving.   Please note  You were cared for by a hospitalist during your hospital stay. If you have any questions about your discharge medications or the care you received while you were in the hospital after you are discharged, you can call the unit and asked to speak with the hospitalist on call if the hospitalist that took care of you is not available. Once you are discharged, your primary care physician will handle any further medical issues. Please note that NO REFILLS for any discharge medications will be authorized once you are discharged, as it is imperative that you return to your primary care physician (or  establish a relationship with a primary care physician if you do not have one) for your aftercare needs so that they can reassess your need for medications and monitor your lab values.   Increase activity slowly   Complete by: As directed      Allergies as of 01/17/2021      Reactions   Lyrica [pregabalin] Swelling   Taking sometime as needed now   Gabapentin Other (See  Comments)   Lower extremity edema      Medication List    STOP taking these medications   amLODipine 10 MG tablet Commonly known as: NORVASC   carvedilol 25 MG tablet Commonly known as: COREG   furosemide 80 MG tablet Commonly known as: LASIX   hydrALAZINE 50 MG tablet Commonly known as: APRESOLINE   ondansetron 8 MG disintegrating tablet Commonly known as: ZOFRAN-ODT   Oxycodone HCl 10 MG Tabs Replaced by: oxyCODONE 20 mg 12 hr tablet   predniSONE 20 MG tablet Commonly known as: DELTASONE   spironolactone 25 MG tablet Commonly known as: ALDACTONE   sulfamethoxazole-trimethoprim 800-160 MG tablet Commonly known as: BACTRIM DS     TAKE these medications   amitriptyline 50 MG tablet Commonly known as: ELAVIL TAKE 1 TABLET BY MOUTH EVERYDAY AT BEDTIME What changed: See the new instructions.   celecoxib 200 MG capsule Commonly known as: CELEBREX TAKE 1 CAPSULE BY MOUTH TWICE A DAY   fluticasone 50 MCG/ACT nasal spray Commonly known as: FLONASE Place 1 spray into both nostrils daily as needed for allergies.   magnesium citrate Soln Take 0.5 Bottles by mouth daily as needed for moderate constipation.   midodrine 5 MG tablet Commonly known as: PROAMATINE Take 1 tablet (5 mg total) by mouth 2 (two) times daily with a meal.   ondansetron 4 MG tablet Commonly known as: Zofran Take 1 tablet (4 mg total) by mouth daily as needed for nausea or vomiting.   oxyCODONE 20 mg 12 hr tablet Commonly known as: OXYCONTIN Take 1 tablet (20 mg total) by mouth every 12 (twelve) hours for 5 days. Replaces: Oxycodone HCl 10 MG Tabs   oxyCODONE 5 MG immediate release tablet Commonly known as: Oxy IR/ROXICODONE Take 1 tablet (5 mg total) by mouth every 4 (four) hours as needed for breakthrough pain or severe pain.   senna-docusate 8.6-50 MG tablet Commonly known as: Senokot-S Take 2 tablets by mouth 2 (two) times daily.   tamsulosin 0.4 MG Caps capsule Commonly known as:  FLOMAX TAKE 1 CAPSULE BY MOUTH EVERY DAY       Allergies  Allergen Reactions  . Lyrica [Pregabalin] Swelling    Taking sometime as needed now  . Gabapentin Other (See Comments)    Lower extremity edema    Consultations:  None   Procedures/Studies: DG Chest 1 View  Result Date: 01/11/2021 CLINICAL DATA:  Fall.  Abdominal pain.  Back pain. EXAM: CHEST  1 VIEW COMPARISON:  One-view chest x-ray 08/08/2020 FINDINGS: Heart size normal. Aortic atherosclerosis noted. Lung volumes are low. Lungs are clear. IMPRESSION: 1. Low lung volumes. 2. No acute cardiopulmonary disease. 3. Aortic atherosclerosis. Electronically Signed   By: San Morelle M.D.   On: 01/11/2021 13:30   DG Thoracic Spine 2 View  Result Date: 01/11/2021 CLINICAL DATA:  Back pain.  Fall. EXAM: THORACIC SPINE 2 VIEWS COMPARISON:  Thoracic spine radiographs 11/21/2020 and thoracolumbar radiograph FINDINGS: T11 superior endplate compression fracture is new from November and appears to have progressed from April. No additional thoracic  fractures are present. Multilevel endplate degenerative changes noted. Aortic atherosclerosis present. Thoracic kyphosis stable. IMPRESSION: 1. Probable progression of T11 superior endplate compression fracture since April. MRI of the thoracic spine or lumbar spine could be used for further evaluation. Both are not necessary. 2. No additional thoracic fractures. 3. Aortic atherosclerosis. Electronically Signed   By: San Morelle M.D.   On: 01/11/2021 13:29   DG Lumbar Spine Complete  Result Date: 01/11/2021 CLINICAL DATA:  Fall.  Back pain. EXAM: LUMBAR SPINE - COMPLETE 4+ VIEW COMPARISON:  09/07/2020. FINDINGS: Lumbar spine numbered the lowest segmented appearing lumbar shaped on lateral view vertebra as L5. L5-S1 posterior fusion. T12, L1, L2, L3 vertebroplasties again noted. Diffuse multilevel degenerative change. Stable mild anterolisthesis L5 on S1. No acute bony abnormality.  Aortoiliac and visceral atherosclerotic vascular changes. IMPRESSION: Postsurgical changes lumbar spine as above. Diffuse osteopenia degenerative change. Mild stable L5 on S1 anterolisthesis. No acute bony abnormality. 2.  Aortoiliac atherosclerotic vascular disease. Electronically Signed   By: Marcello Moores  Register   On: 01/11/2021 13:33   CT Head Wo Contrast  Result Date: 01/11/2021 CLINICAL DATA:  Fall EXAM: CT HEAD WITHOUT CONTRAST TECHNIQUE: Contiguous axial images were obtained from the base of the skull through the vertex without intravenous contrast. COMPARISON:  07/13/2020 FINDINGS: Brain: There is no acute intracranial hemorrhage, mass effect, or edema. Gray-white differentiation is preserved. There is no extra-axial fluid collection. Prominence of the ventricles and sulci reflects stable parenchymal volume loss. Patchy hypoattenuation in the supratentorial white matter is nonspecific but probably reflects similar minor chronic microvascular ischemic changes. Vascular: There is atherosclerotic calcification at the skull base. Skull: Calvarium is unremarkable. Sinuses/Orbits: Pansinus mucosal thickening. Other: No evidence of acute intracranial injury. IMPRESSION: No evidence of acute intracranial injury. Electronically Signed   By: Macy Mis M.D.   On: 01/11/2021 13:42   DG Chest Port 1 View  Result Date: 01/14/2021 CLINICAL DATA:  Positive COVID.  Short of breath EXAM: PORTABLE CHEST 1 VIEW COMPARISON:  Radiograph 01/11/2021 FINDINGS: Posterior cervical fusion. Stable cardiac silhouette. No effusion, infiltrate or pneumothorax. IMPRESSION: No evidence of pneumonia. Electronically Signed   By: Suzy Bouchard M.D.   On: 01/14/2021 11:48     Subjective:  He denies any fever, chills, he had good bowel movement with magnesium citrate, able to sit on the chair for 3 hours yesterday, pain is much better controlled on current regimen.   Discharge Exam: Vitals:   01/16/21 2042 01/17/21 0404   BP: 101/70 110/73  Pulse: 93 89  Resp: 16 17  Temp: 98.5 F (36.9 C) 98.3 F (36.8 C)  SpO2: 92% 90%   Vitals:   01/16/21 0923 01/16/21 1204 01/16/21 2042 01/17/21 0404  BP: (!) 106/56 115/67 101/70 110/73  Pulse:  84 93 89  Resp:  18 16 17   Temp:  98.7 F (37.1 C) 98.5 F (36.9 C) 98.3 F (36.8 C)  TempSrc:  Axillary Axillary Axillary  SpO2:  100% 92% 90%  Weight:      Height:        General: Pt is alert, awake, not in acute distress Cardiovascular: RRR, S1/S2 +, no rubs, no gallops Respiratory: CTA bilaterally, no wheezing, no rhonchi Abdominal: Soft, NT, ND, bowel sounds + Extremities: no edema, no cyanosis    The results of significant diagnostics from this hospitalization (including imaging, microbiology, ancillary and laboratory) are listed below for reference.     Microbiology: Recent Results (from the past 240 hour(s))  Resp Panel by RT-PCR (  Flu A&B, Covid) Nasopharyngeal Swab     Status: Abnormal   Collection Time: 01/11/21  2:35 PM   Specimen: Nasopharyngeal Swab; Nasopharyngeal(NP) swabs in vial transport medium  Result Value Ref Range Status   SARS Coronavirus 2 by RT PCR POSITIVE (A) NEGATIVE Final    Comment: RESULT CALLED TO, READ BACK BY AND VERIFIED WITH: Senaida Ores RN 1746 01/11/21 A BROWNING (NOTE) SARS-CoV-2 target nucleic acids are DETECTED.  The SARS-CoV-2 RNA is generally detectable in upper respiratory specimens during the acute phase of infection. Positive results are indicative of the presence of the identified virus, but do not rule out bacterial infection or co-infection with other pathogens not detected by the test. Clinical correlation with patient history and other diagnostic information is necessary to determine patient infection status. The expected result is Negative.  Fact Sheet for Patients: EntrepreneurPulse.com.au  Fact Sheet for Healthcare Providers: IncredibleEmployment.be  This test is  not yet approved or cleared by the [place] FDA and  has been authorized for detection and/or diagnosis of SARS-CoV-2 by FDA under an Emergency Use Authorization (EUA).  This EUA will remain in effect (meaning this test can be  used) for the duration of  the COVID-19 declaration under Section 564(b)(1) of the Act, 21 U.S.C. section 360bbb-3(b)(1), unless the authorization is terminated or revoked sooner.     Influenza A by PCR NEGATIVE NEGATIVE Final   Influenza B by PCR NEGATIVE NEGATIVE Final    Comment: (NOTE) The Xpert Xpress SARS-CoV-2/FLU/RSV plus assay is intended as an aid in the diagnosis of influenza from Nasopharyngeal swab specimens and should not be used as a sole basis for treatment. Nasal washings and aspirates are unacceptable for Xpert Xpress SARS-CoV-2/FLU/RSV testing.  Fact Sheet for Patients: EntrepreneurPulse.com.au  Fact Sheet for Healthcare Providers: IncredibleEmployment.be  This test is not yet approved or cleared by the [place] FDA and has been authorized for detection and/or diagnosis of SARS-CoV-2 by FDA under an Emergency Use Authorization (EUA). This EUA will remain in effect (meaning this test can be used) for the duration of the COVID-19 declaration under Section 564(b)(1) of the Act, 21 U.S.C. section 360bbb-3(b)(1), unless the authorization is terminated or revoked.  Performed at [facility], [place] [address], [place], [place] [address]      Labs: BNP (last 3 results) Recent Labs    08/08/20 2138 01/13/21 1047  BNP 277.9* 703.5*   Basic Metabolic Panel: Recent Labs  Lab 01/11/21 1241 01/11/21 2106 01/12/21 0122 01/13/21 0436 01/15/21 0027  NA 126* 129* 129* 131* 131*  K 3.7 3.6 3.0* 4.0 4.0  CL 91* 90* 94* 97* 100  CO2 24 26 25 28 25   GLUCOSE 123* 114* 99 91 100*  BUN 21 18 18 15 10   CREATININE 1.54* 1.31* 1.29* 1.28* 0.91  CALCIUM 8.9 8.3* 8.3* 8.6* 8.4*  MG  --   --   --   1.9  --    Liver Function Tests: Recent Labs  Lab 01/11/21 1241  AST 36  ALT 18  ALKPHOS 101  BILITOT 1.1  PROT 6.3*  ALBUMIN 3.1*   No results for input(s): LIPASE, AMYLASE in the last 168 hours. No results for input(s): AMMONIA in the last 168 hours. CBC: Recent Labs  Lab 01/11/21 1241 01/12/21 0122 01/13/21 0436 01/15/21 0027  WBC 12.2* 8.2 8.0 10.9*  NEUTROABS 9.0*  --   --   --   HGB 12.4* 10.2* 10.9* 10.4*  HCT 38.0* 31.1* 33.8* 32.2*  MCV 83.3 82.3 84.3 84.7  PLT 508* 413* 451* 535*   Cardiac Enzymes: No results for input(s): CKTOTAL, CKMB, CKMBINDEX, TROPONINI in the last 168 hours. BNP: Invalid input(s): POCBNP CBG: No results for input(s): GLUCAP in the last 168 hours. D-Dimer No results for input(s): DDIMER in the last 72 hours. Hgb A1c No results for input(s): HGBA1C in the last 72 hours. Lipid Profile No results for input(s): CHOL, HDL, LDLCALC, TRIG, CHOLHDL, LDLDIRECT in the last 72 hours. Thyroid function studies No results for input(s): TSH, T4TOTAL, T3FREE, THYROIDAB in the last 72 hours.  Invalid input(s): FREET3 Anemia work up No results for input(s): VITAMINB12, FOLATE, FERRITIN, TIBC, IRON, RETICCTPCT in the last 72 hours. Urinalysis    Component Value Date/Time   COLORURINE YELLOW 01/11/2021 1242   APPEARANCEUR CLEAR 01/11/2021 1242   LABSPEC 1.008 01/11/2021 1242   PHURINE 6.0 01/11/2021 1242   GLUCOSEU NEGATIVE 01/11/2021 1242   GLUCOSEU NEGATIVE 10/03/2018 1036   HGBUR SMALL (A) 01/11/2021 1242   BILIRUBINUR NEGATIVE 01/11/2021 1242   BILIRUBINUR negative 10/17/2018 [place] 01/11/2021 1242   PROTEINUR NEGATIVE 01/11/2021 1242   UROBILINOGEN 0.2 10/17/2018 1555   UROBILINOGEN 0.2 10/03/2018 1036   NITRITE NEGATIVE 01/11/2021 1242   LEUKOCYTESUR NEGATIVE 01/11/2021 1242   Sepsis Labs Invalid input(s): PROCALCITONIN,  WBC,  LACTICIDVEN Microbiology Recent Results (from the past 240 hour(s))  Resp Panel by  RT-PCR (Flu A&B, Covid) Nasopharyngeal Swab     Status: Abnormal   Collection Time: 01/11/21  2:35 PM   Specimen: Nasopharyngeal Swab; Nasopharyngeal(NP) swabs in vial transport medium  Result Value Ref Range Status   SARS Coronavirus 2 by RT PCR POSITIVE (A) NEGATIVE Final    Comment: RESULT CALLED TO, READ BACK BY AND VERIFIED WITH: Senaida Ores RN 1746 01/11/21 A BROWNING (NOTE) SARS-CoV-2 target nucleic acids are DETECTED.  The SARS-CoV-2 RNA is generally detectable in upper respiratory specimens during the acute phase of infection. Positive results are indicative of the presence of the identified virus, but do not rule out bacterial infection or co-infection with other pathogens not detected by the test. Clinical correlation with patient history and other diagnostic information is necessary to determine patient infection status. The expected result is Negative.  Fact Sheet for Patients: EntrepreneurPulse.com.au  Fact Sheet for Healthcare Providers: IncredibleEmployment.be  This test is not yet approved or cleared by the [place] FDA and  has been authorized for detection and/or diagnosis of SARS-CoV-2 by FDA under an Emergency Use Authorization (EUA).  This EUA will remain in effect (meaning this test can be  used) for the duration of  the COVID-19 declaration under Section 564(b)(1) of the Act, 21 U.S.C. section 360bbb-3(b)(1), unless the authorization is terminated or revoked sooner.     Influenza A by PCR NEGATIVE NEGATIVE Final   Influenza B by PCR NEGATIVE NEGATIVE Final    Comment: (NOTE) The Xpert Xpress SARS-CoV-2/FLU/RSV plus assay is intended as an aid in the diagnosis of influenza from Nasopharyngeal swab specimens and should not be used as a sole basis for treatment. Nasal washings and aspirates are unacceptable for Xpert Xpress SARS-CoV-2/FLU/RSV testing.  Fact Sheet for  Patients: EntrepreneurPulse.com.au  Fact Sheet for Healthcare Providers: IncredibleEmployment.be  This test is not yet approved or cleared by the [place] FDA and has been authorized for detection and/or diagnosis of SARS-CoV-2 by FDA under an Emergency Use Authorization (EUA). This EUA will remain in effect (meaning this test can be used) for the duration of  the COVID-19 declaration under Section 564(b)(1) of the Act, 21 U.S.C. section 360bbb-3(b)(1), unless the authorization is terminated or revoked.  Performed at [facility], [place] [address], [place], [place] [address]      Time coordinating discharge: Over 30 minutes  SIGNED:   Phillips Climes, MD  [facility] 01/17/2021, 10:32 AM Pager   If 7PM-7AM, please contact night-coverage [email or web address] Password [email or web address]

## 2021-01-17 NOTE — Progress Notes (Signed)
PTAR here to pick patient up to transport to [facility].Discharge packet given to PTAR.

## 2021-01-17 NOTE — Care Management Important Message (Signed)
Important Message  Patient Details  Name: [name] MRN: [id number] Date of Birth: 07-19-1932   Medicare Important Message Given:  Yes - Important Message mailed due to current National Emergency   Verbal consent obtained due to current National Emergency  Relationship to patient: Self Contact Name: [facility] Call Date: 01/17/21  Time: 1407 Phone: [phone] Outcome: Spoke with contact Important Message mailed to: Patient address on file   [name] 01/17/2021, 2:08 PM

## 2021-01-19 ENCOUNTER — Other Ambulatory Visit: Payer: Self-pay | Admitting: Internal Medicine

## 2021-01-31 ENCOUNTER — Telehealth: Payer: Self-pay | Admitting: Internal Medicine

## 2021-01-31 NOTE — Telephone Encounter (Signed)
Darlene from [facility] calling to report start of service was moved from 6/22 to 6/27 per patient request  Darlene [phone]

## 2021-01-31 NOTE — Telephone Encounter (Signed)
Noted  

## 2021-01-31 NOTE — Telephone Encounter (Signed)
Fyi.

## 2021-02-06 ENCOUNTER — Telehealth: Payer: Self-pay

## 2021-02-06 NOTE — Telephone Encounter (Signed)
Pt called regarding his left hand and wanted to let luke know that it is not carpal tunnel it is neuropathy

## 2021-02-08 ENCOUNTER — Telehealth: Payer: Self-pay | Admitting: Internal Medicine

## 2021-02-08 NOTE — Telephone Encounter (Signed)
[place] Name: [place] Agency Name: Moreen Fowler Phone #: [phone] LVM Service Requested: PT  (examples: OT/PT/Skilled Nursing/Social Work/Speech Therapy/Wound Care) Frequency of Visits: 1w5

## 2021-02-08 NOTE — Telephone Encounter (Signed)
See below

## 2021-02-09 ENCOUNTER — Telehealth: Payer: Self-pay | Admitting: Internal Medicine

## 2021-02-09 NOTE — Telephone Encounter (Signed)
It looks like he was D/C from hospital to SNF, is he now home? Okay for verbals but needs follow up with [place] within 30 days for this supervision.

## 2021-02-09 NOTE — Telephone Encounter (Signed)
   [place] Name: [facility] Agency Name: Moreen Fowler Phone #: [phone] LVM  Service Requested: nursing and social work eval  (examples: OT/PT/Skilled Nursing/Social Work/Speech Therapy/Wound Care) Frequency of Visits: 1w4   [name] said that the patient has not been compliant with the med changes that were made when he was in the hospital. She said that his BP has been running low and she said that she instructed the patient to follow the discharge instructions and told the patient to follow up with PCP as soon as he can.

## 2021-02-10 NOTE — Telephone Encounter (Signed)
See below. Patient has a follow up scheduled for July 8th at 10:00 am

## 2021-02-10 NOTE — Telephone Encounter (Signed)
Patient is scheduled for July 8 at 10:00 am.

## 2021-02-10 NOTE — Telephone Encounter (Signed)
Noted will address at follow up.Okay for verbals

## 2021-02-11 ENCOUNTER — Other Ambulatory Visit: Payer: Self-pay | Admitting: Internal Medicine

## 2021-02-15 NOTE — Telephone Encounter (Signed)
Does look like he has nerve study from 2018 showing mild CTS at the time so if he has persistent numbness and tingling to the point that it is bothering him, could consider repeat nerve study but that is up to him and the study is only really worth it if it's bothering him enough to consider surgery which would likely be carpal tunnel release depending on the results

## 2021-02-16 NOTE — Telephone Encounter (Signed)
Tried calling to discuss. No answer LM to [facility]

## 2021-02-17 ENCOUNTER — Ambulatory Visit (INDEPENDENT_AMBULATORY_CARE_PROVIDER_SITE_OTHER): Payer: Medicare Other | Admitting: Internal Medicine

## 2021-02-17 ENCOUNTER — Encounter: Payer: Self-pay | Admitting: Internal Medicine

## 2021-02-17 ENCOUNTER — Other Ambulatory Visit: Payer: Self-pay

## 2021-02-17 DIAGNOSIS — I10 Essential (primary) hypertension: Secondary | ICD-10-CM | POA: Diagnosis not present

## 2021-02-17 DIAGNOSIS — M5416 Radiculopathy, lumbar region: Secondary | ICD-10-CM | POA: Diagnosis not present

## 2021-02-17 DIAGNOSIS — I5032 Chronic diastolic (congestive) heart failure: Secondary | ICD-10-CM

## 2021-02-17 DIAGNOSIS — G8929 Other chronic pain: Secondary | ICD-10-CM | POA: Diagnosis not present

## 2021-02-17 MED ORDER — AMITRIPTYLINE HCL 100 MG PO TABS: 100.0000 mg | ORAL_TABLET | Freq: Every day | ORAL | 3 refills | Status: DC

## 2021-02-17 NOTE — Assessment & Plan Note (Signed)
Seeing pain management for oxycodone 10 mg 5 times a day. We will increase elavil to 100 mg nightly to help more with pain and sleeping and keep celebrex 200 mg BID for pain as these seem to be helping some.

## 2021-02-17 NOTE — Patient Instructions (Signed)
We will increase the amitriptyline to 100 mg at night. Until you run out you can take 2 pills at night time.

## 2021-02-17 NOTE — Assessment & Plan Note (Signed)
BP at goal off medications. Was discharged to SNF on midodrine which was either stopped there or not prescribed on discharge. Patient is not taking and BP is normal today. Will maintain off medications with close monitoring.

## 2021-02-17 NOTE — Assessment & Plan Note (Signed)
No signs of exacerbation today and given recent low BP with overdiuresis will have lasix off medication list.

## 2021-02-17 NOTE — Progress Notes (Signed)
   Subjective:   Patient ID: [name], male    DOB: Sep 08, 1931, 87 y.o.   MRN: [id number]  HPI The patient is an 87 YO man coming in for SNF and hospital follow up (in with low BP, covid-19 sepsis, fall with T11 fracture, pain control and fluids and medication adjustment, got monoclonal antibody for covid-19). He is doing okay at home now. Denies falls. Mobility is limited. Pain is slightly better controlled. Using prune juice and senokot for constipation. Not taking midodrine since leave SNF. Denies chest pains or SOB or cough. Can sit up for longer due to less pain. Trying not to fall.  PMH, [facility], social history reviewed and updated  Review of Systems  Constitutional:  Positive for activity change.  HENT: Negative.    Eyes: Negative.   Respiratory:  Negative for cough, chest tightness and shortness of breath.   Cardiovascular:  Negative for chest pain, palpitations and leg swelling.  Gastrointestinal:  Negative for abdominal distention, abdominal pain, constipation, diarrhea, nausea and vomiting.  Musculoskeletal:  Positive for arthralgias, back pain, gait problem and myalgias.  Skin: Negative.   Neurological:  Positive for weakness.  Psychiatric/Behavioral: Negative.     Objective:  Physical Exam Constitutional:      Appearance: He is well-developed.     Comments: Chronically ill appearing  HENT:     Head: Normocephalic and atraumatic.  Cardiovascular:     Rate and Rhythm: Normal rate and regular rhythm.  Pulmonary:     Effort: Pulmonary effort is normal. No respiratory distress.     Breath sounds: Normal breath sounds. No wheezing or rales.  Abdominal:     General: Bowel sounds are normal. There is no distension.     Palpations: Abdomen is soft.     Tenderness: There is no abdominal tenderness. There is no rebound.  Musculoskeletal:        General: Tenderness present.     Cervical back: Normal range of motion.  Skin:    General: Skin is warm and dry.  Neurological:      Mental Status: He is alert and oriented to person, place, and time.     Coordination: Coordination abnormal.     Comments: wheelchair    Vitals:   02/17/21 1006  BP: 126/78  Pulse: (!) 102  Resp: 18  Temp: 98.5 F (36.9 C)  TempSrc: Oral  SpO2: 96%  Weight: 173 lb 9.6 oz (78.7 kg)  Height: 6' (1.829 m)    This visit occurred during the SARS-CoV-2 public health emergency.  Safety protocols were in place, including screening questions prior to the visit, additional usage of staff PPE, and extensive cleaning of exam room while observing appropriate contact time as indicated for disinfecting solutions.   Assessment & Plan:

## 2021-02-21 ENCOUNTER — Telehealth: Payer: Self-pay | Admitting: Internal Medicine

## 2021-02-21 NOTE — Telephone Encounter (Signed)
   Patient called and said he woke up this morning with his hands and feet swollen.

## 2021-02-21 NOTE — Telephone Encounter (Signed)
See below

## 2021-02-21 NOTE — Telephone Encounter (Signed)
    Follow up message    Patient calling to report he has some Furosemide at home and plans to take medication for swelling. Advised patient to wait for provider advice first

## 2021-02-21 NOTE — Telephone Encounter (Signed)
Spoke with the patient and he stated that he is not having any breathing problems. His hands are more swollen than this his feet. He verbalized understanding not to change his medication.

## 2021-02-21 NOTE — Telephone Encounter (Signed)
I would not recommend change to medicine. Is he having any breathing problems?

## 2021-02-22 NOTE — Telephone Encounter (Signed)
See below

## 2021-02-22 NOTE — Telephone Encounter (Signed)
I would recommend not to take furosemide given recent hospital stay for low BP. Again it is not safe to take furosemide for him.

## 2021-02-22 NOTE — Telephone Encounter (Signed)
Spoke with the patient and he verbalized understanding that it is not safe for him to lasix. Swelling has improved.

## 2021-03-07 ENCOUNTER — Encounter: Payer: Self-pay | Admitting: Internal Medicine

## 2021-03-07 ENCOUNTER — Other Ambulatory Visit: Payer: Self-pay

## 2021-03-07 ENCOUNTER — Ambulatory Visit: Payer: Medicare Other | Admitting: Internal Medicine

## 2021-03-07 DIAGNOSIS — M79601 Pain in right arm: Secondary | ICD-10-CM

## 2021-03-07 NOTE — Progress Notes (Signed)
   Subjective:   Patient ID: [name], male    DOB: 11/24/1931, 87 y.o.   MRN: [id number]  HPI The patient is an 87 YO man coming in for wounds on his arms from swelling. He is keeping covered with bandages but is having a hard time applying and worries about them healing. The swelling is down in his arms. Some swelling in his legs.   Review of Systems  Constitutional:  Positive for activity change and fatigue.  HENT: Negative.    Eyes: Negative.   Respiratory:  Negative for cough, chest tightness and shortness of breath.   Cardiovascular:  Negative for chest pain, palpitations and leg swelling.  Gastrointestinal:  Negative for abdominal distention, abdominal pain, constipation, diarrhea, nausea and vomiting.  Musculoskeletal:  Positive for arthralgias, back pain and myalgias.  Skin:  Positive for wound.  Neurological: Negative.   Psychiatric/Behavioral: Negative.     Objective:  Physical Exam Constitutional:      Appearance: He is well-developed.  HENT:     Head: Normocephalic and atraumatic.  Cardiovascular:     Rate and Rhythm: Normal rate and regular rhythm.  Pulmonary:     Effort: Pulmonary effort is normal. No respiratory distress.     Breath sounds: Normal breath sounds. No wheezing or rales.  Abdominal:     General: Bowel sounds are normal. There is no distension.     Palpations: Abdomen is soft.     Tenderness: There is no abdominal tenderness. There is no rebound.  Musculoskeletal:        General: Tenderness present.     Cervical back: Normal range of motion.  Skin:    General: Skin is warm and dry.     Comments: Wound on the left and right forearm without evidence for infection about 1 cm circular each side, applied antibiotic ointment and covered with non-stick pad and then wrapped with ace-wrap  Neurological:     Mental Status: He is alert and oriented to person, place, and time.     Coordination: Coordination normal.    Vitals:   03/07/21 1339  BP:  130/68  Pulse: (!) 109  Resp: 18  Temp: 98.5 F (36.9 C)  TempSrc: Oral  SpO2: 91%  Weight: 181 lb 3.2 oz (82.2 kg)    This visit occurred during the SARS-CoV-2 public health emergency.  Safety protocols were in place, including screening questions prior to the visit, additional usage of staff PPE, and extensive cleaning of exam room while observing appropriate contact time as indicated for disinfecting solutions.   Assessment & Plan:

## 2021-03-07 NOTE — Patient Instructions (Signed)
We will get the wounds wrapped up.

## 2021-03-09 NOTE — Assessment & Plan Note (Signed)
Wound on the right and left forearm appear uninfected. Wrapped and antibiotic ointment applied in office. Showed how to wrap and advised to keep wrapped except for washing for better healing.

## 2021-03-10 ENCOUNTER — Ambulatory Visit: Payer: Medicare Other | Admitting: Internal Medicine

## 2021-03-22 ENCOUNTER — Other Ambulatory Visit: Payer: Self-pay | Admitting: Internal Medicine

## 2021-04-06 ENCOUNTER — Other Ambulatory Visit: Payer: Self-pay | Admitting: Internal Medicine

## 2021-05-13 ENCOUNTER — Telehealth: Payer: Self-pay

## 2021-05-13 NOTE — Telephone Encounter (Signed)
Prolia VOB initiated via [email or web address]  Last OV:  Next OV:  Last Prolia inj: NEW START Next Prolia inj DUE:   

## 2021-06-15 NOTE — Telephone Encounter (Signed)
Prior Auth required by [employer]  PA PROCESS DETAILS: Prior Authorization is required and can be obtained online at [email or web address] or faxed to [phone].

## 2021-06-16 ENCOUNTER — Telehealth: Payer: Self-pay | Admitting: Internal Medicine

## 2021-06-16 NOTE — Telephone Encounter (Signed)
Fyi.

## 2021-06-16 NOTE — Telephone Encounter (Signed)
Team health recommends patient be seen within 24 hours, notes are below-consult note from team health will be scanned into the media tab:  -Caller states right hand swollen and fingers are curled in and stiff. right foot is swollen as well. Both started swelling on Monday (5 days) no hx of arthritis for several years patient states there has been a water filled lump on left wrist. two weeks ago a couple of solid lumps came up the areas are not painful to touch there are a total of 4 knots no fever has neuropathy left sciatic pain and uses daily meds for it no impedment of ROM in hand or foot but there is numbness  Scheduled with PCP on Monday 11.7.22 at 2:20pm, spoke to patient and he understood.

## 2021-06-16 NOTE — Telephone Encounter (Signed)
Patient states he is having pain, stiffness, and swelling in both hands  Patient states right foot is stiff and swollen  Patient denied any other symptoms   Patient transferred to team health

## 2021-06-19 ENCOUNTER — Encounter: Payer: Self-pay | Admitting: Internal Medicine

## 2021-06-19 ENCOUNTER — Other Ambulatory Visit: Payer: Self-pay

## 2021-06-19 ENCOUNTER — Ambulatory Visit: Payer: Medicare Other | Admitting: Internal Medicine

## 2021-06-19 VITALS — BP 128/78 | HR 121 | Resp 18 | Ht 72.0 in | Wt 187.0 lb

## 2021-06-19 DIAGNOSIS — M25531 Pain in right wrist: Secondary | ICD-10-CM

## 2021-06-19 DIAGNOSIS — M25532 Pain in left wrist: Secondary | ICD-10-CM | POA: Diagnosis not present

## 2021-06-19 DIAGNOSIS — Z23 Encounter for immunization: Secondary | ICD-10-CM

## 2021-06-19 NOTE — Progress Notes (Signed)
   Subjective:   Patient ID: [name], male    DOB: 11-May-1932, 87 y.o.   MRN: [id number]  HPI The patient is an 87 YO man coming in for bilateral wrist pain. Cysts and lumps on the left wrist, pain and swelling right with some stiffness morning.   Review of Systems  Constitutional: Negative.   HENT: Negative.    Eyes: Negative.   Respiratory:  Negative for cough, chest tightness and shortness of breath.   Cardiovascular:  Negative for chest pain, palpitations and leg swelling.  Gastrointestinal:  Negative for abdominal distention, abdominal pain, constipation, diarrhea, nausea and vomiting.  Musculoskeletal: Negative.   Skin: Negative.   Neurological: Negative.   Psychiatric/Behavioral: Negative.     Objective:  Physical Exam Constitutional:      Appearance: He is well-developed.  HENT:     Head: Normocephalic and atraumatic.  Cardiovascular:     Rate and Rhythm: Normal rate and regular rhythm.  Pulmonary:     Effort: Pulmonary effort is normal. No respiratory distress.     Breath sounds: Normal breath sounds. No wheezing or rales.  Abdominal:     General: Bowel sounds are normal. There is no distension.     Palpations: Abdomen is soft.     Tenderness: There is no abdominal tenderness. There is no rebound.  Musculoskeletal:        General: Swelling, tenderness and deformity present.     Cervical back: Normal range of motion.  Skin:    General: Skin is warm and dry.  Neurological:     Mental Status: He is alert and oriented to person, place, and time.     Coordination: Coordination abnormal.     Comments: walker    Vitals:   06/19/21 1422  BP: 128/78  Pulse: (!) 121  Resp: 18  SpO2: 94%  Weight: 187 lb (84.8 kg)  Height: 6' (1.829 m)    This visit occurred during the SARS-CoV-2 public health emergency.  Safety protocols were in place, including screening questions prior to the visit, additional usage of staff PPE, and extensive cleaning of exam room while  observing appropriate contact time as indicated for disinfecting solutions.   Flu shot given at visit  Assessment & Plan:  Visit time 15 minutes in face to face communication with patient and coordination of care, additional 5 minutes spent in record review, coordination or care, ordering tests, communicating/referring to other healthcare professionals, documenting in medical records all on the same day of the visit for total time 20 minutes spent on the visit.

## 2021-06-19 NOTE — Assessment & Plan Note (Signed)
Referral to hand surgeon for possible injection or removal of the cyst left wrist. He will continue celebrex 200 mg BID and elavil 100 mg qhs and oxycodone up to 5 pills per day. Offered toradol injection at visit and he declined.

## 2021-06-19 NOTE — Patient Instructions (Addendum)
We will get you in with the hand surgeon for the hands.

## 2021-07-24 NOTE — Progress Notes (Signed)
[name] D.[address] [place] Phone: [phone]   Assessment and Plan:     1. Left shoulder pain, unspecified chronicity 2. Osteoarthritis of left knee, unspecified osteoarthritis type 3.  Strain of left trapezius - Chronic exacerbation, initial sports medicine visit - Patient presents for significant left shoulder pain radiating into trapezius for the past 4 days without significant MOI - Suspect acute flare of chronic osteoarthritis and trapezius strain from overcompensation - Patient elects for CSI subacromial left shoulder and trigger point injections.  Tolerated well per procedure note below - Can continue Tylenol/NSAIDs as needed for pain control - Start HEP for gentle range of motion to prevent frozen shoulder -X-ray obtained in clinic.  My interpretation: No acute fracture or dislocation.  Severe osteoarthritis of glenohumeral joint with cortical irregularities and decreased space  Procedure: Subacromial Injection Side: Left  Risks explained and consent was given verbally. The site was cleaned with alcohol prep. A steroid injection was performed from posterior approach using 67mL of 1% lidocaine without epinephrine and 44mL of kenalog 40mg /ml. This was well tolerated and resulted in symptomatic relief.  Needle was removed, hemostasis achieved, and post injection instructions were explained.   Pt was advised to call or return to clinic if these symptoms worsen or fail to improve as anticipated.  Trigger Point Injection: After informed consent was obtained, skin cleaned with alcohol  prep.  A total of 3 trigger points identified along left trapezius.  Injections given over area of pain for total injection of 2 ml lidocaine 1% w/o epi and 1 mL Kenalog.  Patient had relief after the injection without side effects.  Pt given signs of infection to watch for.  Pertinent previous records reviewed include none   Follow  Up: 3 weeks for reevaluation.  Could consider glenohumeral CSI versus formal PT versus advanced imaging at that time.   Subjective:    I, [name], am serving as a Education administrator for Doctor Glennon Mac  Chief Complaint: left shoulder pain and weakness   HPI:  07/25/2021 Patient is an 87 year old male complaining of left shoulder pain and weakness. Patient states both of his shoulders are tore up from using chain saws over the years. Pain is on top of shoulder 4 days does not remember MOI cant pick up anything . R hand is swollen   Radiates: yes up into neck  Mechanical symptoms: yes  Numbness/tingling:yes for 2 years comes and goes L hand  neuropathy (1 year ago)  Weakness:yes  Aggravates:lifting arm hurts  Treatments tried: already on oxycodone seems to help a little     Relevant Historical Information: Hypertension, CAD, diastolic CHF, osteoarthritis of multiple joints  Additional pertinent review of systems negative.   Current Outpatient Medications:    amitriptyline (ELAVIL) 100 MG tablet, Take 1 tablet (100 mg total) by mouth at bedtime., Disp: 90 tablet, Rfl: 3   celecoxib (CELEBREX) 200 MG capsule, TAKE 1 CAPSULE BY MOUTH TWICE A DAY (Patient taking differently: Take 200 mg by mouth 2 (two) times daily.), Disp: 60 capsule, Rfl: 3   fluticasone (FLONASE) 50 MCG/ACT nasal spray, Place 1 spray into both nostrils daily as needed for allergies., Disp: , Rfl:    magnesium citrate SOLN, Take 0.5 Bottles by mouth daily as needed for moderate constipation., Disp: , Rfl:    ondansetron (ZOFRAN) 4 MG tablet, Take 1 tablet (4 mg total) by mouth daily as needed for nausea or vomiting. (  Patient taking differently: Take 4 mg by mouth as needed for nausea or vomiting.), Disp: 30 tablet, Rfl: 1   oxyCODONE (OXY IR/ROXICODONE) 5 MG immediate release tablet, Take 1 tablet (5 mg total) by mouth every 4 (four) hours as needed for breakthrough pain or severe pain., Disp: 15 tablet, Rfl: 0    tamsulosin (FLOMAX) 0.4 MG CAPS capsule, TAKE 1 CAPSULE BY MOUTH EVERY DAY (Patient taking differently: Take 0.4 mg by mouth daily.), Disp: 90 capsule, Rfl: 1   Objective:     Vitals:   07/25/21 1432 07/25/21 1435  BP:  130/80  Pulse:  (!) 115  SpO2:  95%  Height: 6' (1.829 m) 6' (1.829 m)      Body mass index is 25.36 kg/m.    Physical Exam:    Gen: Appears well, nad, nontoxic and pleasant Neuro:sensation intact, strength is 5/5 with df/pf/inv/ev, muscle tone wnl Skin: no suspicious lesion or defmority Psych: A&O, appropriate mood and affect  Left shoulder: no deformity, swelling or muscle wasting No scapular winging FF 20, abd 20, int 10, ext 20 TTP trapezius, deltoid NTTP over the [place], clavicle, ac, coracoid, biceps groove, humerus,   cervical spine Unable to perform special testing due to patient tenderness Neg ant drawer, sulcus sign, apprehension Negative Spurling's test bilat FROM of neck    Electronically signed by:  [name] D.[facility] 3:07 PM 07/25/21

## 2021-07-24 NOTE — Progress Notes (Deleted)
    [name] D.[place] Phone: [phone]   Assessment and Plan:     There are no diagnoses linked to this encounter.  ***   Pertinent previous records reviewed include ***   Follow Up: ***     Subjective:    I, [name], am serving as a Education administrator for Doctor Glennon Mac  Chief Complaint: left shoulder pain and weakness   HPI:   07/25/2021 Patient is an 87 year old male complaining of left shoulder pain and weakness. Patient states    Relevant Historical Information: ***  Additional pertinent review of systems negative.   Current Outpatient Medications:    amitriptyline (ELAVIL) 100 MG tablet, Take 1 tablet (100 mg total) by mouth at bedtime., Disp: 90 tablet, Rfl: 3   celecoxib (CELEBREX) 200 MG capsule, TAKE 1 CAPSULE BY MOUTH TWICE A DAY (Patient taking differently: Take 200 mg by mouth 2 (two) times daily.), Disp: 60 capsule, Rfl: 3   fluticasone (FLONASE) 50 MCG/ACT nasal spray, Place 1 spray into both nostrils daily as needed for allergies., Disp: , Rfl:    magnesium citrate SOLN, Take 0.5 Bottles by mouth daily as needed for moderate constipation., Disp: , Rfl:    ondansetron (ZOFRAN) 4 MG tablet, Take 1 tablet (4 mg total) by mouth daily as needed for nausea or vomiting. (Patient taking differently: Take 4 mg by mouth as needed for nausea or vomiting.), Disp: 30 tablet, Rfl: 1   oxyCODONE (OXY IR/ROXICODONE) 5 MG immediate release tablet, Take 1 tablet (5 mg total) by mouth every 4 (four) hours as needed for breakthrough pain or severe pain., Disp: 15 tablet, Rfl: 0   tamsulosin (FLOMAX) 0.4 MG CAPS capsule, TAKE 1 CAPSULE BY MOUTH EVERY DAY (Patient taking differently: Take 0.4 mg by mouth daily.), Disp: 90 capsule, Rfl: 1   Objective:     There were no vitals filed for this visit.    There is no height or weight on file to calculate BMI.    Physical Exam:     ***   Electronically signed by:  [name] D.[facility] 1:52 PM 07/24/21

## 2021-07-25 ENCOUNTER — Ambulatory Visit: Payer: Medicare Other

## 2021-07-25 ENCOUNTER — Other Ambulatory Visit: Payer: Self-pay

## 2021-07-25 ENCOUNTER — Ambulatory Visit: Payer: Medicare Other | Admitting: Sports Medicine

## 2021-07-25 ENCOUNTER — Ambulatory Visit (INDEPENDENT_AMBULATORY_CARE_PROVIDER_SITE_OTHER): Payer: Medicare Other | Admitting: Sports Medicine

## 2021-07-25 ENCOUNTER — Ambulatory Visit (INDEPENDENT_AMBULATORY_CARE_PROVIDER_SITE_OTHER): Payer: Medicare Other

## 2021-07-25 VITALS — BP 130/80 | HR 115 | Ht 72.0 in

## 2021-07-25 DIAGNOSIS — M25512 Pain in left shoulder: Secondary | ICD-10-CM | POA: Diagnosis not present

## 2021-07-25 DIAGNOSIS — M1712 Unilateral primary osteoarthritis, left knee: Secondary | ICD-10-CM | POA: Diagnosis not present

## 2021-07-25 DIAGNOSIS — S46812A Strain of other muscles, fascia and tendons at shoulder and upper arm level, left arm, initial encounter: Secondary | ICD-10-CM | POA: Diagnosis not present

## 2021-07-25 NOTE — Patient Instructions (Addendum)
Good to see you Shoulder HEP 3 week follow up  

## 2021-07-27 ENCOUNTER — Other Ambulatory Visit: Payer: Self-pay | Admitting: Internal Medicine

## 2021-08-11 NOTE — Progress Notes (Signed)
[name] D.[name] Phone: [phone]   Assessment and Plan:     1. Left shoulder pain, unspecified chronicity 2. 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 - Patient received significant improvement with subacromial CSI and trigger point injections -Continue HEP  Pertinent previous records reviewed include x-ray shoulder 07/26/2021   Follow Up: As needed if no improvement or worsening of symptoms   Subjective:   I, [name], am serving as a Education administrator for [name]  Chief Complaint: left shoulder pain   HPI:   07/25/2021 Patient is an 87 year old male complaining of left shoulder pain and weakness. Patient states both of his shoulders are tore up from using chain saws over the years. Pain is on top of shoulder 4 days does not remember MOI cant pick up anything . R hand is swollen    Radiates: yes up into neck  Mechanical symptoms: yes  Numbness/tingling:yes for 2 years comes and goes L hand  neuropathy (1 year ago)  Weakness:yes  Aggravates:lifting arm hurts  Treatments tried: already on oxycodone seems to help a little    08/15/2020 Patient states that the pain is gone and can use it very well and has a little pain with ROM but just feels tight. Pain in L hand is gone along with numbness    Relevant Historical Information: Hypertension, CAD, diastolic CHF, osteoarthritis of multiple joints    Additional pertinent review of systems negative.   Current Outpatient Medications:     amitriptyline (ELAVIL) 100 MG tablet, Take 1 tablet (100 mg total) by mouth at bedtime., Disp: 90 tablet, Rfl: 3   celecoxib (CELEBREX) 200 MG capsule, TAKE 1 CAPSULE BY MOUTH TWICE A DAY (Patient taking differently: Take 200 mg by mouth 2 (two) times daily.), Disp: 60 capsule, Rfl: 3   fluticasone (FLONASE) 50 MCG/ACT nasal spray, Place 1 spray into both nostrils daily as needed for allergies., Disp: , Rfl:    magnesium citrate SOLN, Take 0.5 Bottles by mouth daily as needed for moderate constipation., Disp: , Rfl:    ondansetron (ZOFRAN) 4 MG tablet, Take 1 tablet (4 mg total) by mouth daily as needed for nausea or vomiting. (Patient taking differently: Take 4 mg by mouth as needed for nausea or vomiting.), Disp: 30 tablet, Rfl: 1   oxyCODONE (OXY IR/ROXICODONE) 5 MG immediate release tablet, Take 1 tablet (5 mg total) by mouth every 4 (four) hours as needed for breakthrough pain or severe pain., Disp: 15 tablet, Rfl: 0   tamsulosin (FLOMAX) 0.4 MG CAPS capsule, TAKE 1 CAPSULE BY MOUTH EVERY DAY, Disp: 90 capsule, Rfl: 1   Objective:     Vitals:   08/15/21 1032  BP: 120/80  Pulse: 86  SpO2: 90%  Height: 6' (1.829 m)      Body mass index is 25.36 kg/m.    Physical Exam:    Gen: Appears well, nad, nontoxic and pleasant Neuro:sensation intact, strength is 5/5 with df/pf/inv/ev, muscle tone wnl Skin: no suspicious lesion or defmority Psych: A&O, appropriate mood and affect   Left shoulder: no deformity, swelling or muscle wasting No  scapular winging FF 90, abd 90, int 15, ext 30 TTP mildly trapezius, deltoid NTTP over the [place], clavicle, ac, coracoid, biceps groove, humerus,   cervical spine Neg ant drawer, sulcus sign, apprehension Negative Spurling's test bilat FROM of neck    Electronically signed by:  [name] D.[facility] 10:49 AM 08/15/21

## 2021-08-15 ENCOUNTER — Ambulatory Visit (INDEPENDENT_AMBULATORY_CARE_PROVIDER_SITE_OTHER): Payer: Medicare Other | Admitting: Sports Medicine

## 2021-08-15 ENCOUNTER — Other Ambulatory Visit: Payer: Self-pay

## 2021-08-15 VITALS — BP 120/80 | HR 86 | Ht 72.0 in

## 2021-08-15 DIAGNOSIS — S42122K Displaced fracture of acromial process, left shoulder, subsequent encounter for fracture with nonunion: Secondary | ICD-10-CM

## 2021-08-15 DIAGNOSIS — M25512 Pain in left shoulder: Secondary | ICD-10-CM | POA: Diagnosis not present

## 2021-08-15 NOTE — Patient Instructions (Addendum)
Good to see you  Follow up as needed  

## 2021-08-17 NOTE — Telephone Encounter (Signed)
Prior Auth initiated via [email or web address] KEY:  [id number]  PA Case ID: [id number]

## 2021-08-21 NOTE — Telephone Encounter (Signed)
Decision Notes:  PROLIA INJ 60MG /ML is a benefit exclusion under your Medicare Advantage (MA) plan. Drug coverage provided by your plan is limited only to those drugs covered under your medical benefit. Please refer to your Evidence of Coverage (EOC) For further information. Your Medicare Advantage (MA) plan does not cover outpatient prescription drugs. However, you may have a separate commercial pharmacy benefit or Medicare Part D benefit managed by another carrier. If you have prescription drug coverage with another carrier, please contact them directly for information about how to access your prescription drug benefit. Reviewed by: Sherrlyn [email or web address].

## 2021-09-05 ENCOUNTER — Other Ambulatory Visit: Payer: Self-pay

## 2021-09-05 ENCOUNTER — Ambulatory Visit (INDEPENDENT_AMBULATORY_CARE_PROVIDER_SITE_OTHER): Payer: Medicare Other

## 2021-09-05 ENCOUNTER — Ambulatory Visit: Payer: Medicare Other | Admitting: Sports Medicine

## 2021-09-05 ENCOUNTER — Ambulatory Visit: Payer: Self-pay

## 2021-09-05 VITALS — BP 134/80 | HR 101 | Ht 72.0 in

## 2021-09-05 DIAGNOSIS — M25551 Pain in right hip: Secondary | ICD-10-CM | POA: Diagnosis not present

## 2021-09-05 DIAGNOSIS — T148XXA Other injury of unspecified body region, initial encounter: Secondary | ICD-10-CM | POA: Diagnosis not present

## 2021-09-05 NOTE — Patient Instructions (Addendum)
Good to see you  Recommend icing lateral hip 6 week follow up

## 2021-09-05 NOTE — Addendum Note (Signed)
Addended by: Boris Lown B on: 09/05/2021 02:14 PM   Modules accepted: Orders

## 2021-09-05 NOTE — Progress Notes (Signed)
[name] Phone: [phone]   Assessment and Plan:     1. Right hip pain -Chronic with exacerbation, initial sports medicine visit - Large hematoma seen on ultrasound.  No acute fracture seen on x-ray.  Hematoma aspirated per procedure note below.  Based on location of hematoma and appearance, I suspect patient may have torn gluteal musculature at greater trochanteric insertion site.  As fluid has been present for 3 months, we will send out for fluid analysis.  No warmth or erythema to surrounding tissues - X-ray obtained in clinic.  My interpretation: No acute fracture or dislocation of hip.  Chronic appearing cortical changes at femoral head and acetabulum consistent with moderate osteoarthritis - [place] LIMITED JOINT SPACE STRUCTURES LOW RIGHT(NO LINKED CHARGES) - DG HIP UNILAT WITH PELVIS 2-3 VIEWS RIGHT; Future   Pertinent previous records reviewed include none  Procedure: Ultrasound Guided lateral hip aspiration Side: Right Diagnosis: Hematoma [place] Indication:  - accuracy is paramount for diagnosis - to ensure therapeutic efficacy or procedural success - to reduce procedural risk  After PARQ discussed and consent was given verbally. The site was cleaned with chlorhexidine prep. An ultrasound transducer was placed on the lateral thigh/hip.  Image taken of large hypoechoic, mobile mass with a hyperechoic circular appearing mass within hypoechoic fluid mass.  In 18-gauge needle was advanced into hematoma under ultrasound guidance and 95 cc red-yellow fluid was aspirated.  This was well tolerated and resulted in  relief.  Needle was removed and dressing placed and post injection instructions were given including  a discussion of likely return of pain today after the anesthetic wears off (with the possibility of worsened pain) until the steroid starts to work in 1-3 days.   Pt was advised to call or  return to clinic if these symptoms worsen or fail to improve as anticipated.  Follow Up: 6 weeks for reevaluation of right hip hematoma as well as repeat CSI left shoulder   Subjective:   I, [name], am serving as a Education administrator for Doctor Glennon Mac  Chief Complaint: right hip bruise   HPI:   09/05/21 Patient is a 88 year old male complaining of right hip bruise. Patient states he had something in his pocket and when he got in the car he rolled over on it now he has swelling and a some pain for about 3 months. would also like to talk about shoulder injection again. today is his 44 anniversary  Relevant Historical Information: Hypertension, CAD, diastolic CHF, osteoarthritis of multiple joints  Additional pertinent review of systems negative.   Current Outpatient Medications:    amitriptyline (ELAVIL) 100 MG tablet, Take 1 tablet (100 mg total) by mouth at bedtime., Disp: 90 tablet, Rfl: 3   celecoxib (CELEBREX) 200 MG capsule, TAKE 1 CAPSULE BY MOUTH TWICE A DAY (Patient taking differently: Take 200 mg by mouth 2 (two) times daily.), Disp: 60 capsule, Rfl: 3   fluticasone (FLONASE) 50 MCG/ACT nasal spray, Place 1 spray into both nostrils daily as needed for allergies., Disp: , Rfl:    magnesium citrate SOLN, Take 0.5 Bottles by mouth daily as needed for moderate constipation., Disp: , Rfl:    ondansetron (ZOFRAN) 4 MG tablet, Take 1 tablet (4 mg total) by mouth daily as needed for nausea or vomiting. (Patient taking differently: Take 4 mg by mouth as needed for nausea or vomiting.), Disp: 30 tablet, Rfl: 1   oxyCODONE (  OXY IR/ROXICODONE) 5 MG immediate release tablet, Take 1 tablet (5 mg total) by mouth every 4 (four) hours as needed for breakthrough pain or severe pain., Disp: 15 tablet, Rfl: 0   tamsulosin (FLOMAX) 0.4 MG CAPS capsule, TAKE 1 CAPSULE BY MOUTH EVERY DAY, Disp: 90 capsule, Rfl: 1   Objective:     Vitals:   09/05/21 1250  BP: 134/80  Pulse: (!) 101  SpO2:  95%  Height: 6' (1.829 m)      Body mass index is 25.36 kg/m.    Physical Exam:    General: awake, alert, and oriented no acute distress, nontoxic Skin: no suspicious lesions or rashes Neuro:sensation intact distally with no dificits, normal muscle tone, no atrophy, strength 5/5 in all tested lower ext groups Psych: normal mood and affect, speech clear  Right hip: Approximately 4 x 4cm soft, mobile mass over right greater trochanter with TTP ROM Fexion 90, ext 15, IR 35, ER 35 NTTP over the hip flexors, glute musculature, si joint, lumbar spine Negative log roll with FROM Negative FABER Negative FADIR  Gait slow and shuffling using 4 pronged walker for support   Electronically signed by:  [name] D.[facility] 1:40 PM 09/05/21

## 2021-09-06 LAB — SYNOVIAL FLUID, CRYSTAL

## 2021-09-11 LAB — ANAEROBIC AND AEROBIC CULTURE
AER RESULT:: NO GROWTH
MICRO NUMBER:: 12911883
MICRO NUMBER:: 12911884
SPECIMEN QUALITY:: ADEQUATE
SPECIMEN QUALITY:: ADEQUATE

## 2021-09-19 ENCOUNTER — Other Ambulatory Visit: Payer: Self-pay | Admitting: Anesthesiology

## 2021-09-19 DIAGNOSIS — M5417 Radiculopathy, lumbosacral region: Secondary | ICD-10-CM

## 2021-09-19 DIAGNOSIS — M546 Pain in thoracic spine: Secondary | ICD-10-CM

## 2021-09-19 DIAGNOSIS — M48062 Spinal stenosis, lumbar region with neurogenic claudication: Secondary | ICD-10-CM

## 2021-09-19 DIAGNOSIS — G8929 Other chronic pain: Secondary | ICD-10-CM

## 2021-09-26 ENCOUNTER — Ambulatory Visit
Admission: RE | Admit: 2021-09-26 | Discharge: 2021-09-26 | Disposition: A | Discharge status: Home / Self Care | Payer: Medicare Other | Source: Ambulatory Visit | Attending: Anesthesiology | Admitting: Anesthesiology

## 2021-09-26 ENCOUNTER — Other Ambulatory Visit: Payer: Self-pay

## 2021-09-26 DIAGNOSIS — M48062 Spinal stenosis, lumbar region with neurogenic claudication: Secondary | ICD-10-CM

## 2021-09-26 DIAGNOSIS — M5417 Radiculopathy, lumbosacral region: Secondary | ICD-10-CM

## 2021-09-26 DIAGNOSIS — M546 Pain in thoracic spine: Secondary | ICD-10-CM

## 2021-09-26 DIAGNOSIS — G8929 Other chronic pain: Secondary | ICD-10-CM

## 2021-10-02 ENCOUNTER — Ambulatory Visit (INDEPENDENT_AMBULATORY_CARE_PROVIDER_SITE_OTHER): Payer: Medicare Other

## 2021-10-02 ENCOUNTER — Ambulatory Visit (INDEPENDENT_AMBULATORY_CARE_PROVIDER_SITE_OTHER): Payer: Medicare Other | Admitting: Sports Medicine

## 2021-10-02 ENCOUNTER — Other Ambulatory Visit: Payer: Self-pay

## 2021-10-02 VITALS — Ht 72.0 in

## 2021-10-02 DIAGNOSIS — S42122K Displaced fracture of acromial process, left shoulder, subsequent encounter for fracture with nonunion: Secondary | ICD-10-CM

## 2021-10-02 DIAGNOSIS — M546 Pain in thoracic spine: Secondary | ICD-10-CM

## 2021-10-02 NOTE — Patient Instructions (Addendum)
Good to see you Use heating pad over back and painful areas  Recommend calling pain management to set up a follow up  Tylenol 500 mg  2 tablets 3 times a day for pain relief  2 week follow up with [place]

## 2021-10-02 NOTE — Progress Notes (Signed)
[name] [place] Phone: [phone]   Assessment and Plan:     1. Acute bilateral thoracic back pain 2. 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  My interpretation: No acute fracture or vertebral collapse.  Prior vertebral fractures T12 seen with calcifications with similar appearance to recent MRI - Patient at high risk for fracture due to history of multiple fractures, age and comorbidities.  No bony tenderness on physical exam.  No bruising or deformity on physical exam.  No pain with palpation of prior acromion process fracture - Patient is currently being evaluated by pain management and has had recent lumbar and thoracic MRIs.  Recommend following up with pain management to discuss these results in full and to discuss treatment plan - Continue current pain medications and can start Tylenol 1000 mg 3 times daily for the next 2 weeks for pain relief    Pertinent previous records reviewed include lumbar MRI 09/26/2021, thoracic spine MRI 09/26/2021   Follow Up: 2 weeks for reevaluation.  Would consider repeat imaging if patient still experiences sharp pain   Subjective:   I, [name], am serving as a Education administrator for Doctor Glennon Mac  Chief Complaint: pain in shoulder blades back and chest  HPI:   10/02/21 Patient is an 88 year old male complaining of shoulder blade back and chest pain. Patient states he fell backwards on saturday while pushing his wife in her wheelchair  between shoulder blades, stomach area   Relevant Historical Information: Hypertension, CAD, diastolic CHF, osteoarthritis of multiple joints  Additional pertinent review of systems  negative.   Current Outpatient Medications:    amitriptyline (ELAVIL) 100 MG tablet, Take 1 tablet (100 mg total) by mouth at bedtime., Disp: 90 tablet, Rfl: 3   celecoxib (CELEBREX) 200 MG capsule, TAKE 1 CAPSULE BY MOUTH TWICE A DAY (Patient taking differently: Take 200 mg by mouth 2 (two) times daily.), Disp: 60 capsule, Rfl: 3   fluticasone (FLONASE) 50 MCG/ACT nasal spray, Place 1 spray into both nostrils daily as needed for allergies., Disp: , Rfl:    magnesium citrate SOLN, Take 0.5 Bottles by mouth daily as needed for moderate constipation., Disp: , Rfl:    ondansetron (ZOFRAN) 4 MG tablet, Take 1 tablet (4 mg total) by mouth daily as needed for nausea or vomiting. (Patient taking differently: Take 4 mg by mouth as needed for nausea or vomiting.), Disp: 30 tablet, Rfl: 1   oxyCODONE (OXY IR/ROXICODONE) 5 MG immediate release tablet, Take 1 tablet (5 mg total) by mouth every 4 (four) hours as needed for breakthrough pain or severe pain., Disp: 15 tablet, Rfl: 0   tamsulosin (FLOMAX) 0.4 MG CAPS capsule, TAKE 1 CAPSULE BY MOUTH EVERY DAY, Disp: 90 capsule, Rfl: 1   Objective:     Vitals:   10/02/21 0910  Height: 6' (1.829 m)      Body mass index is 25.36 kg/m.    Physical Exam:    Gen: Appears well, nad, nontoxic and pleasant Psych: Alert and oriented, appropriate mood and affect Neuro: sensation intact, strength is 5/5 in upper and lower extremities, muscle tone wnl Skin: 1 to 2 cm lesion at posterior left of school where patient says he hit his  head after fall.  No step-off.  Mild TTP at area of lesion, NTTP surrounding lesion.  No significant swelling, bruising, edema.  Otherwise no susupicious lesions or rashes.  No other bruising or deformity  Back - Normal skin, Spine with normal alignment and no deformity.   No tenderness to vertebral process palpation.   Thoracic paraspinous muscles are moderately tender and without spasm No flail chest with  inspiration   Electronically signed by:  Jordan Mccreedy D.[facility] 10:38 AM 10/02/21

## 2021-10-03 ENCOUNTER — Other Ambulatory Visit: Payer: Medicare Other

## 2021-10-03 ENCOUNTER — Telehealth: Payer: Self-pay | Admitting: Sports Medicine

## 2021-10-03 NOTE — Telephone Encounter (Signed)
Patient called asking if it is still okay for him to have the implant done at his Pain Management office? He said that nothing is scheduled yet, but if they reach out to him he would like to know if this is something he would be able to have done with the current shape he is in after his fall?  Please advise.

## 2021-10-04 NOTE — Telephone Encounter (Signed)
Called and spoke with pt and let him know that it was fine for him to go ahead with pain management appointment for implant

## 2021-10-26 ENCOUNTER — Telehealth: Payer: Self-pay | Admitting: Internal Medicine

## 2021-10-26 NOTE — Telephone Encounter (Signed)
Left message for patient to call back to schedule Medicare Annual Wellness Visit  ? ?Last AWV  11/20/19 ? ?Please schedule at anytime with [facility] if patient calls the office back.   ? ?40 Minutes appointment  ? ?Any questions, please call me at [phone]  ?

## 2021-11-17 ENCOUNTER — Ambulatory Visit: Payer: Medicare Other

## 2021-11-20 ENCOUNTER — Ambulatory Visit (INDEPENDENT_AMBULATORY_CARE_PROVIDER_SITE_OTHER): Payer: Medicare Other

## 2021-11-20 DIAGNOSIS — Z0000 Encounter for general adult medical examination without abnormal findings: Secondary | ICD-10-CM

## 2021-11-20 NOTE — Patient Instructions (Signed)
Mr. Toya , ?Thank you for taking time to come for your Medicare Wellness Visit. I appreciate your ongoing commitment to your health goals. Please review the following plan we discussed and let me know if I can assist you in the future.  ? ?Screening recommendations/referrals: ?Colonoscopy: no longer required  ?Recommended yearly ophthalmology/optometry visit for glaucoma screening and checkup ?Recommended yearly dental visit for hygiene and checkup ? ?Vaccinations: ?Influenza vaccine: completed  ?Pneumococcal vaccine: completed  ?Tdap vaccine: 11/06/2017 ?Shingles vaccine: will consider    ? ?Advanced directives: yes  ? ?Conditions/risks identified: none  ? ?Next appointment: none  ? ?Preventive Care 1 Years and Older, Male ?Preventive care refers to lifestyle choices and visits with your health care provider that can promote health and wellness. ?What does preventive care include? ?A yearly physical exam. This is also called an annual well check. ?Dental exams once or twice a year. ?Routine eye exams. Ask your health care provider how often you should have your eyes checked. ?Personal lifestyle choices, including: ?Daily care of your teeth and gums. ?Regular physical activity. ?Eating a healthy diet. ?Avoiding tobacco and drug use. ?Limiting alcohol use. ?Practicing safe sex. ?Taking low doses of aspirin every day. ?Taking vitamin and mineral supplements as recommended by your health care provider. ?What happens during an annual well check? ?The services and screenings done by your health care provider during your annual well check will depend on your age, overall health, lifestyle risk factors, and family history of disease. ?Counseling  ?Your health care provider may ask you questions about your: ?Alcohol use. ?Tobacco use. ?Drug use. ?Emotional well-being. ?Home and relationship well-being. ?Sexual activity. ?Eating habits. ?History of falls. ?Memory and ability to understand (cognition). ?Work and work  Statistician. ?Screening  ?You may have the following tests or measurements: ?Height, weight, and BMI. ?Blood pressure. ?Lipid and cholesterol levels. These may be checked every 5 years, or more frequently if you are over 35 years old. ?Skin check. ?Lung cancer screening. You may have this screening every year starting at age 1 if you have a 30-pack-year history of smoking and currently smoke or have quit within the past 15 years. ?Fecal occult blood test (FOBT) of the stool. You may have this test every year starting at age 41. ?Flexible sigmoidoscopy or colonoscopy. You may have a sigmoidoscopy every 5 years or a colonoscopy every 10 years starting at age 76. ?Prostate cancer screening. Recommendations will vary depending on your family history and other risks. ?Hepatitis C blood test. ?Hepatitis B blood test. ?Sexually transmitted disease (STD) testing. ?Diabetes screening. This is done by checking your blood sugar (glucose) after you have not eaten for a while (fasting). You may have this done every 1-3 years. ?Abdominal aortic aneurysm (AAA) screening. You may need this if you are a current or former smoker. ?Osteoporosis. You may be screened starting at age 75 if you are at high risk. ?Talk with your health care provider about your test results, treatment options, and if necessary, the need for more tests. ?Vaccines  ?Your health care provider may recommend certain vaccines, such as: ?Influenza vaccine. This is recommended every year. ?Tetanus, diphtheria, and acellular pertussis (Tdap, Td) vaccine. You may need a Td booster every 10 years. ?Zoster vaccine. You may need this after age 88. ?Pneumococcal 13-valent conjugate (PCV13) vaccine. One dose is recommended after age 94. ?Pneumococcal polysaccharide (PPSV23) vaccine. One dose is recommended after age 30. ?Talk to your health care provider about which screenings and vaccines you need and  how often you need them. ?This information is not intended to replace  advice given to you by your health care provider. Make sure you discuss any questions you have with your health care provider. ?Document Released: 08/26/2015 Document Revised: 04/18/2016 Document Reviewed: 05/31/2015 ?Elsevier Interactive Patient Education ? 2017 [employer]. ? ?Fall Prevention in the Home ?Falls can cause injuries. They can happen to people of all ages. There are many things you can do to make your home safe and to help prevent falls. ?What can I do on the outside of my home? ?Regularly fix the edges of walkways and driveways and fix any cracks. ?Remove anything that might make you trip as you walk through a door, such as a raised step or threshold. ?Trim any bushes or trees on the path to your home. ?Use bright outdoor lighting. ?Clear any walking paths of anything that might make someone trip, such as rocks or tools. ?Regularly check to see if handrails are loose or broken. Make sure that both sides of any steps have handrails. ?Any raised decks and porches should have guardrails on the edges. ?Have any leaves, snow, or ice cleared regularly. ?Use sand or salt on walking paths during winter. ?Clean up any spills in your garage right away. This includes oil or grease spills. ?What can I do in the bathroom? ?Use night lights. ?Install grab bars by the toilet and in the tub and shower. Do not use towel bars as grab bars. ?Use non-skid mats or decals in the tub or shower. ?If you need to sit down in the shower, use a plastic, non-slip stool. ?Keep the floor dry. Clean up any water that spills on the floor as soon as it happens. ?Remove soap buildup in the tub or shower regularly. ?Attach bath mats securely with double-sided non-slip rug tape. ?Do not have throw rugs and other things on the floor that can make you trip. ?What can I do in the bedroom? ?Use night lights. ?Make sure that you have a light by your bed that is easy to reach. ?Do not use any sheets or blankets that are too big for your bed.  They should not hang down onto the floor. ?Have a firm chair that has side arms. You can use this for support while you get dressed. ?Do not have throw rugs and other things on the floor that can make you trip. ?What can I do in the kitchen? ?Clean up any spills right away. ?Avoid walking on wet floors. ?Keep items that you use a lot in easy-to-reach places. ?If you need to reach something above you, use a strong step stool that has a grab bar. ?Keep electrical cords out of the way. ?Do not use floor polish or wax that makes floors slippery. If you must use wax, use non-skid floor wax. ?Do not have throw rugs and other things on the floor that can make you trip. ?What can I do with my stairs? ?Do not leave any items on the stairs. ?Make sure that there are handrails on both sides of the stairs and use them. Fix handrails that are broken or loose. Make sure that handrails are as long as the stairways. ?Check any carpeting to make sure that it is firmly attached to the stairs. Fix any carpet that is loose or worn. ?Avoid having throw rugs at the top or bottom of the stairs. If you do have throw rugs, attach them to the floor with carpet tape. ?Make sure that you have  a light switch at the top of the stairs and the bottom of the stairs. If you do not have them, ask someone to add them for you. ?What else can I do to help prevent falls? ?Wear shoes that: ?Do not have high heels. ?Have rubber bottoms. ?Are comfortable and fit you well. ?Are closed at the toe. Do not wear sandals. ?If you use a stepladder: ?Make sure that it is fully opened. Do not climb a closed stepladder. ?Make sure that both sides of the stepladder are locked into place. ?Ask someone to hold it for you, if possible. ?Clearly mark and make sure that you can see: ?Any grab bars or handrails. ?First and last steps. ?Where the edge of each step is. ?Use tools that help you move around (mobility aids) if they are needed. These  include: ?Canes. ?Walkers. ?Scooters. ?Crutches. ?Turn on the lights when you go into a dark area. Replace any light bulbs as soon as they burn out. ?Set up your furniture so you have a clear path. Avoid moving your furniture aroun

## 2021-11-20 NOTE — Progress Notes (Addendum)
? ?Subjective:  ? [name] is a 88 y.o. male who presents for an Subsequent  Medicare Annual Wellness Visit. ? ?I connected with Karna Dupes today by telephone and verified that I am speaking with the correct person using two identifiers. ?Location patient: home ?Location provider: work ?Persons participating in the virtual visit: patient, provider. ?  ?I discussed the limitations, risks, security and privacy concerns of performing an evaluation and management service by telephone and the availability of in person appointments. I also discussed with the patient that there may be a patient responsible charge related to this service. The patient expressed understanding and verbally consented to this telephonic visit.  ?  ?Interactive audio and video telecommunications were attempted between this provider and patient, however failed, due to patient having technical difficulties OR patient did not have access to video capability.  We continued and completed visit with audio only. ? ?  ?Review of Systems    ? ?Cardiac Risk Factors include: advanced age (>83mn, >>68women);male gender ? ?   ?Objective:  ?  ?Today's Vitals  ? ?There is no height or weight on file to calculate BMI. ? ? ?  11/20/2021  ?  2:15 PM 01/11/2021  ? 10:00 PM 01/11/2021  ? 12:20 PM 09/07/2020  ?  9:00 AM 08/08/2020  ?  2:20 PM 06/10/2020  ? 12:23 PM 05/20/2020  ?  3:59 AM  ?Advanced Directives  ?Does Patient Have a Medical Advance Directive? Yes Yes No No No Yes No  ?Type of [place] will Living will    [place]   ?Does patient want to make changes to medical advance directive?  No - Patient declined       ?Copy of [place]? No - copy requested     No - copy requested   ?Would patient like information on creating a medical advance directive?  No - Patient declined No - Patient declined No - Patient declined No - Guardian declined     ? ? ?Current Medications (verified) ?Outpatient Encounter Medications as of 11/20/2021  ?Medication Sig  ? fluticasone (FLONASE) 50 MCG/ACT nasal spray Place 1 spray into both nostrils daily as needed for allergies.  ? oxyCODONE (OXY IR/ROXICODONE) 5 MG immediate release tablet Take 1 tablet (5 mg total) by mouth every 4 (four) hours as needed for breakthrough pain or severe pain.  ? tamsulosin (FLOMAX) 0.4 MG CAPS capsule TAKE 1 CAPSULE BY MOUTH EVERY DAY  ? amitriptyline (ELAVIL) 100 MG tablet Take 1 tablet (100 mg total) by mouth at bedtime. (Patient not taking: Reported on 11/20/2021)  ? celecoxib (CELEBREX) 200 MG capsule TAKE 1 CAPSULE BY MOUTH TWICE A DAY (Patient not taking: Reported on 11/20/2021)  ? magnesium citrate SOLN Take 0.5 Bottles by mouth daily as needed for moderate constipation. (Patient not taking: Reported on 11/20/2021)  ? ondansetron (ZOFRAN) 4 MG tablet Take 1 tablet (4 mg total) by mouth daily as needed for nausea or vomiting. (Patient not taking: Reported on 11/20/2021)  ? ?No facility-administered encounter medications on file as of 11/20/2021.  ? ? ?Allergies (verified) ?Lyrica [pregabalin] and Gabapentin  ? ?History: ?Past Medical History:  ?Diagnosis Date  ? ACTINIC KERATOSIS, HEAD 03/23/2008  ? ALLERGIC RHINITIS 03/27/2007  ? Arthritis   ? BENIGN PROSTATIC HYPERTROPHY, WITH URINARY OBSTRUCTION 10/16/2007  ? CAD 08/18/2007  ? "patient denies any issues with heart, does not see cardiologist"  ? Carpal tunnel  syndrome, bilateral 09/04/2016  ? CARPAL TUNNEL SYNDROME, LEFT 04/13/2008  ? CHF (congestive heart failure) ([place])   ? Chronic back pain   ? Chronic neck pain   ? Complication of anesthesia   ? left lower jaw teeth have been injured in the past and he has lost those, now has 2 loose front teeth due to a mouth injury on 01/02/20  ? DVT (deep venous thrombosis) ([place])   ? ERECTILE DYSFUNCTION, ORGANIC 02/20/2010  ? Gout, unspecified 02/20/2010  ? Headache(784.0)   ? migraines hx of- started at age 20-  none since 69  ? History of melanoma   ? HYPERLIPIDEMIA, WITH HIGH HDL 10/18/2008  ? HYPERTENSION 05/09/2007  ? sees Dr. Benay Pillow  ? KNEE PAIN 10/10/2009  ? LOW BACK PAIN 03/27/2007  ? NEOPLASM, MALIGNANT, SKIN, TRUNK 05/09/2007  ? squamous cell on scalp, sideburn and ear (right). meyloma on truckl.  ? Patellar tendinitis 10/10/2009  ? Sciatic nerve pain   ? on Oxycodone as needed  ? SKIN CANCER, HX OF 03/27/2007  ? UNS ADVRS EFF UNS RX MEDICINAL&BIOLOGICAL SBSTNC 08/18/2007  ? Urinary urgency   ? ?Past Surgical History:  ?Procedure Laterality Date  ? APPENDECTOMY  1969  ? APPLICATION OF WOUND VAC Left 01/19/2020  ? Procedure: Application Of Wound Vac;  Surgeon: [name], MD;  Location: [facility];  Service: Orthopedics;  Laterality: Left;  ? BLEPHAROPLASTY    ? CARPAL TUNNEL RELEASE Right   ? CHOLECYSTECTOMY    ? ESOPHAGOGASTRODUODENOSCOPY (EGD) WITH PROPOFOL N/A 04/26/2015  ? Procedure: ESOPHAGOGASTRODUODENOSCOPY (EGD) WITH PROPOFOL;  Surgeon: Inda Castle, MD;  Location: [facility];  Service: Endoscopy;  Laterality: N/A;  ? EYE SURGERY    ? bilateral cataracts removed and vitrectomies  ? JOINT REPLACEMENT Right   ? knee  ? KNEE ARTHROSCOPY    ? KYPHOPLASTY N/A 06/10/2020  ? Procedure: Thoracic Twelve, Lumbar One, Lumbar Two Kyphoplasty;  Surgeon: Ashok Pall, MD;  Location: [facility];  Service: Neurosurgery;  Laterality: N/A;  3C/RM 21  ? KYPHOPLASTY N/A 09/07/2020  ? Procedure: Lumbar three KYPHOPLASTY;  Surgeon: Ashok Pall, MD;  Location: [facility];  Service: Neurosurgery;  Laterality: N/A;  ? LAMINECTOMY    ? LATERAL FUSION LUMBAR SPINE, TRANSVERSE    ? LUMBAR LAMINECTOMY/DECOMPRESSION MICRODISCECTOMY Left 11/03/2012  ? Procedure: LUMBAR LAMINECTOMY/DECOMPRESSION MICRODISCECTOMY 1 LEVEL;  Surgeon: Hosie Spangle, MD;  Location: [facility];  Service: Neurosurgery;  Laterality: Left;  LEFT L5S1 laminotomy foraminotomy and possible microdiskectomy  ? MELANOMA EXCISION    ? scalp  ? NASAL SINUS SURGERY    ? ORIF  PATELLA Left 01/19/2020  ? Procedure: LEFT OPEN REDUCTION INTERNAL (ORIF) FIXATION PATELLA WITH AUTO GRAFTING USING HAMSTRING REINFORCEMENT;  Surgeon: [name], MD;  Location: [facility];  Service: Orthopedics;  Laterality: Left;  ? POSTERIOR CERVICAL FUSION/FORAMINOTOMY Right 10/01/2012  ? Procedure: POSTERIOR CERVICAL FUSION/FORAMINOTOMY LEVEL 1;  Surgeon: Hosie Spangle, MD;  Location: [facility];  Service: Neurosurgery;  Laterality: Right;  Right Cervical seven thoracic one Cervical laminectomy/foraminotomy with posterior cervical arthrodesis   ? POSTERIOR LUMBAR FUSION N/A 05/23/2013  ? Procedure: exploration of lumbar wound, explantation of left interbody implant.;  Surgeon: Hosie Spangle, MD;  Location: [facility];  Service: Neurosurgery;  Laterality: N/A;  ? RECTAL SURGERY    ? Fissure  ? TOTAL KNEE ARTHROPLASTY Left 05/07/2019  ? Procedure: LEFT TOTAL KNEE ARTHROPLASTY;  Surgeon: [name], MD;  Location: [facility];  Service: Orthopedics;  Laterality: Left;  ? VITRECTOMY    ? right and left 2013  ? ?Family History  ?Problem Relation Age of Onset  ? Dementia Mother   ? Anemia Father   ? Heart disease Father   ? Stroke Sister   ?     ICH  ? Lung disease Sister   ? ?Social History  ? ?Socioeconomic History  ? Marital status: Married  ?  Spouse name: Not on file  ? Number of children: 3  ? Years of education: 78  ? Highest education level: Not on file  ?Occupational History  ? Occupation: Retired  ?Tobacco Use  ? Smoking status: Former  ?  Packs/day: 1.00  ?  Years: 33.00  ?  Pack years: 33.00  ?  Types: Cigarettes  ?  Quit date: 09/29/1981  ?  Years since quitting: 40.1  ? Smokeless tobacco: Never  ?Vaping Use  ? Vaping Use: Never used  ?Substance and Sexual Activity  ? Alcohol use: Not Currently  ?  Comment: rare  ? Drug use: No  ? Sexual activity: Yes  ?  Partners: Female  ?  Comment: Married  ?Other Topics Concern  ? Not on file  ?Social History Narrative  ? Lives at home w/ his wife  ?  Left-handed  ? Caffeine: 1 cup coffee daily  ? 1 son alive, 2 sons deceased  ? Retired Building control surveyor, Furniture conservator/restorer.   ? Education: GED  ? ?Social Determinants of Health  ? ?Financial Resource Strain: Low Risk   ? Difficulty of Pa

## 2021-12-07 ENCOUNTER — Telehealth: Payer: Self-pay | Admitting: Internal Medicine

## 2021-12-07 NOTE — Telephone Encounter (Signed)
Should have visit, last renal function is almost a year ago. I don't see any clinical symptoms he is having (guessing swelling?) ?

## 2021-12-07 NOTE — Telephone Encounter (Signed)
Pt called in and is requesting a "nice, strong diuretic"  ? ?Pt understands that he has not been seen by PCP in a while and wants to know if he can have medication for relief.  ? ?[id number] [place] [address] Phone:  [phone] ?Fax:  [phone] ?  ? ?

## 2021-12-11 NOTE — Telephone Encounter (Signed)
Called pt. LVM asking him to give our office a call back to discuss and possibly schedule a office visit. Office number was provided.  ?

## 2022-01-03 ENCOUNTER — Encounter: Payer: Self-pay | Admitting: Family Medicine

## 2022-01-03 ENCOUNTER — Ambulatory Visit: Payer: Medicare Other | Admitting: Family Medicine

## 2022-01-03 VITALS — BP 150/82 | HR 97 | Temp 98.0°F | Ht 72.0 in

## 2022-01-03 DIAGNOSIS — I5032 Chronic diastolic (congestive) heart failure: Secondary | ICD-10-CM

## 2022-01-03 DIAGNOSIS — R600 Localized edema: Secondary | ICD-10-CM | POA: Diagnosis not present

## 2022-01-03 DIAGNOSIS — R2689 Other abnormalities of gait and mobility: Secondary | ICD-10-CM | POA: Insufficient documentation

## 2022-01-03 DIAGNOSIS — I10 Essential (primary) hypertension: Secondary | ICD-10-CM | POA: Diagnosis not present

## 2022-01-03 DIAGNOSIS — F1110 Opioid abuse, uncomplicated: Secondary | ICD-10-CM | POA: Insufficient documentation

## 2022-01-03 DIAGNOSIS — S22089A Unspecified fracture of T11-T12 vertebra, initial encounter for closed fracture: Secondary | ICD-10-CM | POA: Insufficient documentation

## 2022-01-03 DIAGNOSIS — F1120 Opioid dependence, uncomplicated: Secondary | ICD-10-CM | POA: Insufficient documentation

## 2022-01-03 DIAGNOSIS — F1920 Other psychoactive substance dependence, uncomplicated: Secondary | ICD-10-CM | POA: Insufficient documentation

## 2022-01-03 LAB — CBC WITH DIFFERENTIAL/PLATELET
Basophils Absolute: 0.1 10*3/uL (ref 0.0–0.1)
Basophils Relative: 0.6 % (ref 0.0–3.0)
Eosinophils Absolute: 0.2 10*3/uL (ref 0.0–0.7)
Eosinophils Relative: 1.9 % (ref 0.0–5.0)
HCT: 40.6 % (ref 39.0–52.0)
Hemoglobin: 13.2 g/dL (ref 13.0–17.0)
Lymphocytes Relative: 21.7 % (ref 12.0–46.0)
Lymphs Abs: 1.8 10*3/uL (ref 0.7–4.0)
MCHC: 32.6 g/dL (ref 30.0–36.0)
MCV: 86.2 fl (ref 78.0–100.0)
Monocytes Absolute: 0.8 10*3/uL (ref 0.1–1.0)
Monocytes Relative: 9.8 % (ref 3.0–12.0)
Neutro Abs: 5.4 10*3/uL (ref 1.4–7.7)
Neutrophils Relative %: 66 % (ref 43.0–77.0)
Platelets: 437 10*3/uL — ABNORMAL HIGH (ref 150.0–400.0)
RBC: 4.72 Mil/uL (ref 4.22–5.81)
RDW: 17.4 % — ABNORMAL HIGH (ref 11.5–15.5)
WBC: 8.1 10*3/uL (ref 4.0–10.5)

## 2022-01-03 LAB — BRAIN NATRIURETIC PEPTIDE: Pro B Natriuretic peptide (BNP): 266 pg/mL — ABNORMAL HIGH (ref 0.0–100.0)

## 2022-01-03 LAB — COMPREHENSIVE METABOLIC PANEL
ALT: 16 U/L (ref 0–53)
AST: 21 U/L (ref 0–37)
Albumin: 3.8 g/dL (ref 3.5–5.2)
Alkaline Phosphatase: 112 U/L (ref 39–117)
BUN: 20 mg/dL (ref 6–23)
CO2: 26 mEq/L (ref 19–32)
Calcium: 9 mg/dL (ref 8.4–10.5)
Chloride: 106 mEq/L (ref 96–112)
Creatinine, Ser: 1.06 mg/dL (ref 0.40–1.50)
GFR: 62.72 mL/min (ref >60.00)
Glucose, Bld: 107 mg/dL — ABNORMAL HIGH (ref 70–99)
Potassium: 4 mEq/L (ref 3.5–5.1)
Sodium: 141 mEq/L (ref 135–145)
Total Bilirubin: 1.1 mg/dL (ref 0.2–1.2)
Total Protein: 6.5 g/dL (ref 6.0–8.3)

## 2022-01-03 MED ORDER — FUROSEMIDE 20 MG PO TABS: 20.0000 mg | ORAL_TABLET | Freq: Every day | ORAL | 0 refills | Status: DC

## 2022-01-03 NOTE — Assessment & Plan Note (Addendum)
Pitting edema bilaterally but otherwise appears euvolemic.  Lasix prescribed since his electrolytes are normal and BNP is elevated.  Advised to cut back on alcohol intake, sodium and elevate his legs when sitting.  Follow-up with PCP next week

## 2022-01-03 NOTE — Assessment & Plan Note (Signed)
Blood pressure has been elevated and increased pedal edema.  Check labs and electrolytes are normal.  BNP elevated but stable in comparison to previous results.  Advised him to not self medicate with his old medications at home.  I will prescribe Lasix 20 mg daily and have him follow-up next week with his PCP.  We also discussed elevating his legs when he sitting and avoiding sodium.  Recommend he cut back on alcohol significantly or stop it if he can do this safely.

## 2022-01-03 NOTE — Patient Instructions (Addendum)
Go to the first floor for labs before you leave.   We will be in touch with your results and send in a medication for you to start taking for your blood pressure.   Keep an eye on your blood pressure at home.   Hold off on alcohol.  Watch your sodium or salt intake.   Follow up with Dr. Sharlet Salina in 2-3 weeks.    DASH Eating Plan DASH stands for Dietary Approaches to Stop Hypertension. The DASH eating plan is a healthy eating plan that has been shown to: Reduce high blood pressure (hypertension). Reduce your risk for type 2 diabetes, heart disease, and stroke. Help with weight loss. What are tips for following this plan? Reading food labels Check food labels for the amount of salt (sodium) per serving. Choose foods with less than 5 percent of the Daily Value of sodium. Generally, foods with less than 300 milligrams (mg) of sodium per serving fit into this eating plan. To find whole grains, look for the word "whole" as the first word in the ingredient list. Shopping Buy products labeled as "low-sodium" or "no salt added." Buy fresh foods. Avoid canned foods and pre-made or frozen meals. Cooking Avoid adding salt when cooking. Use salt-free seasonings or herbs instead of table salt or sea salt. Check with your health care provider or pharmacist before using salt substitutes. Do not fry foods. Cook foods using healthy methods such as baking, boiling, grilling, roasting, and broiling instead. Cook with heart-healthy oils, such as olive, canola, avocado, soybean, or sunflower oil. Meal planning  Eat a balanced diet that includes: 4 or more servings of fruits and 4 or more servings of vegetables each day. Try to fill one-half of your plate with fruits and vegetables. 6-8 servings of whole grains each day. Less than 6 oz (170 g) of lean meat, poultry, or fish each day. A 3-oz (85-g) serving of meat is about the same size as a deck of cards. One egg equals 1 oz (28 g). 2-3 servings of  low-fat dairy each day. One serving is 1 cup (237 mL). 1 serving of nuts, seeds, or beans 5 times each week. 2-3 servings of heart-healthy fats. Healthy fats called omega-3 fatty acids are found in foods such as walnuts, flaxseeds, fortified milks, and eggs. These fats are also found in cold-water fish, such as sardines, salmon, and mackerel. Limit how much you eat of: Canned or prepackaged foods. Food that is high in trans fat, such as some fried foods. Food that is high in saturated fat, such as fatty meat. Desserts and other sweets, sugary drinks, and other foods with added sugar. Full-fat dairy products. Do not salt foods before eating. Do not eat more than 4 egg yolks a week. Try to eat at least 2 vegetarian meals a week. Eat more home-cooked food and less restaurant, buffet, and fast food. Lifestyle When eating at a restaurant, ask that your food be prepared with less salt or no salt, if possible. If you drink alcohol: Limit how much you use to: 0-1 drink a day for women who are not pregnant. 0-2 drinks a day for men. Be aware of how much alcohol is in your drink. In the [place] one drink equals one 12 oz bottle of beer (355 mL), one 5 oz glass of wine (148 mL), or one 1 oz glass of hard liquor (44 mL). General information Avoid eating more than 2,300 mg of salt a day. If you have hypertension, you may need  to reduce your sodium intake to 1,500 mg a day. Work with your health care provider to maintain a healthy body weight or to lose weight. Ask what an ideal weight is for you. Get at least 30 minutes of exercise that causes your heart to beat faster (aerobic exercise) most days of the week. Activities may include walking, swimming, or biking. Work with your health care provider or dietitian to adjust your eating plan to your individual calorie needs. What foods should I eat? Fruits All fresh, dried, or frozen fruit. Canned fruit in natural juice (without added  sugar). Vegetables Fresh or frozen vegetables (raw, steamed, roasted, or grilled). Low-sodium or reduced-sodium tomato and vegetable juice. Low-sodium or reduced-sodium tomato sauce and tomato paste. Low-sodium or reduced-sodium canned vegetables. Grains Whole-grain or whole-wheat bread. Whole-grain or whole-wheat pasta. Brown rice. [place]. Bulgur. Whole-grain and low-sodium cereals. Pita bread. Low-fat, low-sodium crackers. Whole-wheat flour tortillas. Meats and other proteins Skinless chicken or [place]. Ground chicken or [place]. Pork with fat trimmed off. Fish and seafood. Egg whites. Dried beans, peas, or lentils. Unsalted nuts, nut butters, and seeds. Unsalted canned beans. Lean cuts of beef with fat trimmed off. Low-sodium, lean precooked or cured meat, such as sausages or meat loaves. Dairy Low-fat (1%) or fat-free (skim) milk. Reduced-fat, low-fat, or fat-free cheeses. Nonfat, low-sodium ricotta or cottage cheese. Low-fat or nonfat yogurt. Low-fat, low-sodium cheese. Fats and oils Soft margarine without trans fats. Vegetable oil. Reduced-fat, low-fat, or light mayonnaise and salad dressings (reduced-sodium). Canola, safflower, olive, avocado, soybean, and sunflower oils. Avocado. Seasonings and condiments Herbs. Spices. Seasoning mixes without salt. Other foods Unsalted popcorn and pretzels. Fat-free sweets. The items listed above may not be a complete list of foods and beverages you can eat. Contact a dietitian for more information. What foods should I avoid? Fruits Canned fruit in a light or heavy syrup. Fried fruit. Fruit in cream or butter sauce. Vegetables Creamed or fried vegetables. Vegetables in a cheese sauce. Regular canned vegetables (not low-sodium or reduced-sodium). Regular canned tomato sauce and paste (not low-sodium or reduced-sodium). Regular tomato and vegetable juice (not low-sodium or reduced-sodium). [name] Olives. Grains Baked goods made with fat, such  as croissants, muffins, or some breads. Dry pasta or rice meal packs. Meats and other proteins Fatty cuts of meat. Ribs. Fried meat. [name] Bologna, salami, and other precooked or cured meats, such as sausages or meat loaves. Fat from the back of a pig (fatback). Bratwurst. Salted nuts and seeds. Canned beans with added salt. Canned or smoked fish. Whole eggs or egg yolks. Chicken or [place] with skin. Dairy Whole or 2% milk, cream, and half-and-half. Whole or full-fat cream cheese. Whole-fat or sweetened yogurt. Full-fat cheese. Nondairy creamers. Whipped toppings. Processed cheese and cheese spreads. Fats and oils Butter. Stick margarine. Lard. Shortening. Ghee. Bacon fat. Tropical oils, such as coconut, palm kernel, or palm oil. Seasonings and condiments Onion salt, garlic salt, seasoned salt, table salt, and sea salt. Worcestershire sauce. Tartar sauce. Barbecue sauce. Teriyaki sauce. Soy sauce, including reduced-sodium. Steak sauce. Canned and packaged gravies. Fish sauce. Oyster sauce. Cocktail sauce. Store-bought horseradish. Ketchup. Mustard. Meat flavorings and tenderizers. Bouillon cubes. Hot sauces. Pre-made or packaged marinades. Pre-made or packaged taco seasonings. Relishes. Regular salad dressings. Other foods Salted popcorn and pretzels. The items listed above may not be a complete list of foods and beverages you should avoid. Contact a dietitian for more information. Where to find more information National Heart, Lung, and Blood Institute: https://wilson-eaton.com/ American Heart Association:  www.heart.org Academy of Nutrition and Dietetics: www.eatright.[place]: www.kidney.org Summary The DASH eating plan is a healthy eating plan that has been shown to reduce high blood pressure (hypertension). It may also reduce your risk for type 2 diabetes, heart disease, and stroke. When on the DASH eating plan, aim to eat more fresh fruits and vegetables, whole grains, lean  proteins, low-fat dairy, and heart-healthy fats. With the DASH eating plan, you should limit salt (sodium) intake to 2,300 mg a day. If you have hypertension, you may need to reduce your sodium intake to 1,500 mg a day. Work with your health care provider or dietitian to adjust your eating plan to your individual calorie needs. This information is not intended to replace advice given to you by your health care provider. Make sure you discuss any questions you have with your health care provider. Document Revised: 07/03/2019 Document Reviewed: 07/03/2019 Elsevier Patient Education  [place].

## 2022-01-03 NOTE — Progress Notes (Signed)
His labs are stable. I will send in Lasix for him to start taking daily and then follow up with Dr. Sharlet Salina in one week please. She may want to recheck labs at that point. This should with the swelling in his legs and feet and his BP.

## 2022-01-03 NOTE — Progress Notes (Signed)
Subjective:     Patient ID: [name], male    DOB: 12/05/1931, 88 y.o.   MRN: [id number]  Chief Complaint  Patient presents with   Hypertension    High BP readings going all the way to 144R systolic. This morning was 140/64. Has been going on for about a week.  Had some leftover medication and yesterday took 2 tablets of hydralazine 50 mg and 2 tablets of spirolactone 25 mg. States he did not take any today as he was told to stop until seen by provider.    HPI Patient is in today for recently elevated BP readings and LE edema.  States he has been going to [facility] in [place]. Hoping to be candidate for implant for back pain.  States his systolic BP at his visit was 200 on more than one occasion.   Denies fever, chills, dizziness, headache, vision changes, chest pain, palpitations, shortness of breath, abdominal pain, N/V/D, urinary symptoms.  States he has noticed an increase in bilateral lower extremity edema, worse on the right as usual for him.  States he has a history of hypertension and was taken off of his BP medications in 2022.   States he took some of his old medications including hydralazine 50 mg and spironolactone 25 mg bid yesterday.   Reports drinking more rum than usual. Drinking 4-5 glasses per day lately. States he did not have any alcohol yesterday.  States he became concerned that the increased alcohol could be affecting his blood pressure.  Recent increase in oxycodone, managed by [facility].  [name] practice is managing his neuropathy.   Health Maintenance Due  Topic Date Due   Zoster Vaccines- Shingrix (1 of 2) Never done    Past Medical History:  Diagnosis Date   ACTINIC KERATOSIS, HEAD 03/23/2008   ALLERGIC RHINITIS 03/27/2007   Arthritis    BENIGN PROSTATIC HYPERTROPHY, WITH URINARY OBSTRUCTION 10/16/2007   CAD 08/18/2007   "patient denies any issues with heart, does not see cardiologist"   Carpal tunnel  syndrome, bilateral 09/04/2016   CARPAL TUNNEL SYNDROME, LEFT 04/13/2008   CHF (congestive heart failure) (HCC)    Chronic back pain    Chronic neck pain    Complication of anesthesia    left lower jaw teeth have been injured in the past and he has lost those, now has 2 loose front teeth due to a mouth injury on 01/02/20   DVT (deep venous thrombosis) (HCC)    ERECTILE DYSFUNCTION, ORGANIC 02/20/2010   Gout, unspecified 02/20/2010   Headache(784.0)    migraines hx of- started at age 68- none since 64   History of melanoma    HYPERLIPIDEMIA, WITH HIGH HDL 10/18/2008   HYPERTENSION 05/09/2007   sees Dr. Benay Pillow   KNEE PAIN 10/10/2009   LOW BACK PAIN 03/27/2007   NEOPLASM, MALIGNANT, SKIN, TRUNK 05/09/2007   squamous cell on scalp, sideburn and ear (right). meyloma on truckl.   Patellar tendinitis 10/10/2009   Sciatic nerve pain    on Oxycodone as needed   SKIN CANCER, HX OF 03/27/2007   UNS ADVRS EFF UNS RX MEDICINAL&BIOLOGICAL SBSTNC 08/18/2007   Urinary urgency     Past Surgical History:  Procedure Laterality Date   APPENDECTOMY  1540   APPLICATION OF WOUND VAC Left 01/19/2020   Procedure: Application Of Wound Vac;  Surgeon: [name], MD;  Location: [place];  Service: Orthopedics;  Laterality: Left;   BLEPHAROPLASTY  CARPAL TUNNEL RELEASE Right    CHOLECYSTECTOMY     ESOPHAGOGASTRODUODENOSCOPY (EGD) WITH PROPOFOL N/A 04/26/2015   Procedure: ESOPHAGOGASTRODUODENOSCOPY (EGD) WITH PROPOFOL;  Surgeon: Inda Castle, MD;  [facility];  Service: Endoscopy;  Laterality: N/A;   EYE SURGERY     bilateral cataracts removed and vitrectomies   JOINT REPLACEMENT Right    knee   KNEE ARTHROSCOPY     KYPHOPLASTY N/A 06/10/2020   Procedure: Thoracic Twelve, Lumbar One, Lumbar Two Kyphoplasty;  Surgeon: Ashok Pall, MD;  Location: [place];  Service: Neurosurgery;  Laterality: N/A;  3C/RM 21   KYPHOPLASTY N/A 09/07/2020   Procedure: Lumbar three KYPHOPLASTY;  Surgeon: Ashok Pall, MD;  Location: [place];  Service: Neurosurgery;  Laterality: N/A;   LAMINECTOMY     LATERAL FUSION LUMBAR SPINE, TRANSVERSE     LUMBAR LAMINECTOMY/DECOMPRESSION MICRODISCECTOMY Left 11/03/2012   Procedure: LUMBAR LAMINECTOMY/DECOMPRESSION MICRODISCECTOMY 1 LEVEL;  Surgeon: Hosie Spangle, MD;  Location: [place] [facility];  Service: Neurosurgery;  Laterality: Left;  LEFT L5S1 laminotomy foraminotomy and possible microdiskectomy   MELANOMA EXCISION     scalp   NASAL SINUS SURGERY     ORIF PATELLA Left 01/19/2020   Procedure: LEFT OPEN REDUCTION INTERNAL (ORIF) FIXATION PATELLA WITH AUTO GRAFTING USING HAMSTRING REINFORCEMENT;  Surgeon: [name], MD;  Location: [place];  Service: Orthopedics;  Laterality: Left;   POSTERIOR CERVICAL FUSION/FORAMINOTOMY Right 10/01/2012   Procedure: POSTERIOR CERVICAL FUSION/FORAMINOTOMY LEVEL 1;  Surgeon: Hosie Spangle, MD;  Location: [place] [facility];  Service: Neurosurgery;  Laterality: Right;  Right Cervical seven thoracic one Cervical laminectomy/foraminotomy with posterior cervical arthrodesis    POSTERIOR LUMBAR FUSION N/A 05/23/2013   Procedure: exploration of lumbar wound, explantation of left interbody implant.;  Surgeon: Hosie Spangle, MD;  Location: [place];  Service: Neurosurgery;  Laterality: N/A;   RECTAL SURGERY     Fissure   TOTAL KNEE ARTHROPLASTY Left 05/07/2019   Procedure: LEFT TOTAL KNEE ARTHROPLASTY;  Surgeon: [name], MD;  Location: [place];  Service: Orthopedics;  Laterality: Left;   VITRECTOMY     right and left 2013    Family History  Problem Relation Age of Onset   Dementia Mother    Anemia Father    Heart disease Father    Stroke Sister        ICH   Lung disease Sister     Social History   Socioeconomic History   Marital status: Married    Spouse name: Not on file   Number of children: 3   Years of education: 12   Highest education level: Not on file  Occupational History   Occupation: Retired   Tobacco Use   Smoking status: Former    Packs/day: 1.00    Years: 33.00    Pack years: 33.00    Types: Cigarettes    Quit date: 09/29/1981    Years since quitting: 40.2   Smokeless tobacco: Never  Vaping Use   Vaping Use: Never used  Substance and Sexual Activity   Alcohol use: Not Currently    Comment: rare   Drug use: No   Sexual activity: Yes    Partners: Female    Comment: Married  Other Topics Concern   Not on file  Social History Narrative   Lives at home w/ his wife   Left-handed   Caffeine: 1 cup coffee daily   1 son alive, 2 sons deceased   Retired Building control surveyor, Furniture conservator/restorer.  Education: GED   Social Determinants of Radio broadcast assistant Strain: Low Risk    Difficulty of Paying Living Expenses: Not hard at all  Food Insecurity: No Food Insecurity   Worried About Charity fundraiser in the Last Year: Never true   Arboriculturist in the Last Year: Never true  Transportation Needs: No Transportation Needs   Lack of Transportation (Medical): No   Lack of Transportation (Non-Medical): No  Physical Activity: Inactive   Days of Exercise per Week: 0 days   Minutes of Exercise per Session: 0 min  Stress: No Stress Concern Present   Feeling of Stress : Not at all  Social Connections: Moderately Isolated   Frequency of Communication with Friends and Family: Twice a week   Frequency of Social Gatherings with Friends and Family: Twice a week   Attends Religious Services: Never   Marine scientist or Organizations: No   Attends Music therapist: Never   Marital Status: Married  Human resources officer Violence: Not At Risk   Fear of Current or Ex-Partner: No   Emotionally Abused: No   Physically Abused: No   Sexually Abused: No    Outpatient Medications Prior to Visit  Medication Sig Dispense Refill   fluticasone (FLONASE) 50 MCG/ACT nasal spray Place 1 spray into both nostrils daily as needed for allergies.     oxyCODONE (OXY IR/ROXICODONE) 5 MG  immediate release tablet Take 1 tablet (5 mg total) by mouth every 4 (four) hours as needed for breakthrough pain or severe pain. 15 tablet 0   tamsulosin (FLOMAX) 0.4 MG CAPS capsule TAKE 1 CAPSULE BY MOUTH EVERY DAY 90 capsule 1   amitriptyline (ELAVIL) 100 MG tablet Take 1 tablet (100 mg total) by mouth at bedtime. (Patient not taking: Reported on 11/20/2021) 90 tablet 3   celecoxib (CELEBREX) 200 MG capsule TAKE 1 CAPSULE BY MOUTH TWICE A DAY (Patient not taking: Reported on 11/20/2021) 60 capsule 3   magnesium citrate SOLN Take 0.5 Bottles by mouth daily as needed for moderate constipation. (Patient not taking: Reported on 11/20/2021)     ondansetron (ZOFRAN) 4 MG tablet Take 1 tablet (4 mg total) by mouth daily as needed for nausea or vomiting. (Patient not taking: Reported on 11/20/2021) 30 tablet 1   No facility-administered medications prior to visit.    Allergies  Allergen Reactions   Amlodipine Swelling    Ankle swelling   Lyrica [Pregabalin] Swelling    Taking sometime as needed now   Gabapentin Other (See Comments)    Lower extremity edema    ROS Pertinent positives and negatives in the history of present illness.     Objective:    Physical Exam Constitutional:      General: He is not in acute distress.    Appearance: He is not ill-appearing.  Eyes:     Conjunctiva/sclera: Conjunctivae normal.  Cardiovascular:     Rate and Rhythm: Normal rate.     Pulses: Normal pulses.  Pulmonary:     Effort: Pulmonary effort is normal.     Breath sounds: Normal breath sounds.  Musculoskeletal:     Right lower leg: 2+ Edema present.     Left lower leg: 1+ Edema present.     Right ankle: Swelling present.     Left ankle: Swelling present.     Comments: No sign of cellulitis   Skin:    General: Skin is warm and dry.  Neurological:  General: No focal deficit present.     Mental Status: He is alert and oriented to person, place, and time.     Motor: No weakness.     Gait:  Gait normal.  Psychiatric:        Mood and Affect: Mood normal.    BP (!) 150/82 (BP Location: Left Arm, Patient Position: Sitting, Cuff Size: Large)   Pulse 97   Temp 98 F (36.7 C) (Temporal)   Ht 6' (1.829 m)   SpO2 96%   BMI 25.36 kg/m  Wt Readings from Last 3 Encounters:  06/19/21 187 lb (84.8 kg)  03/07/21 181 lb 3.2 oz (82.2 kg)  02/17/21 173 lb 9.6 oz (78.7 kg)       Assessment & Plan:   Problem List Items Addressed This Visit       Cardiovascular and Mediastinum   Chronic diastolic CHF (congestive heart failure) (HCC)    Pitting edema bilaterally but otherwise appears euvolemic.  Lasix prescribed since his electrolytes are normal and BNP is elevated.  Advised to cut back on alcohol intake, sodium and elevate his legs when sitting.  Follow-up with PCP next week       Relevant Medications   furosemide (LASIX) 20 MG tablet   Other Relevant Orders   CBC with Differential/Platelet (Completed)   Comprehensive metabolic panel (Completed)   Brain natriuretic peptide (Completed)   Essential hypertension - Primary    Blood pressure has been elevated and increased pedal edema.  Check labs and electrolytes are normal.  BNP elevated but stable in comparison to previous results.  Advised him to not self medicate with his old medications at home.  I will prescribe Lasix 20 mg daily and have him follow-up next week with his PCP.  We also discussed elevating his legs when he sitting and avoiding sodium.  Recommend he cut back on alcohol significantly or stop it if he can do this safely.       Relevant Medications   furosemide (LASIX) 20 MG tablet   Other Relevant Orders   CBC with Differential/Platelet (Completed)   Comprehensive metabolic panel (Completed)   Brain natriuretic peptide (Completed)     Other   Pedal edema   Relevant Medications   furosemide (LASIX) 20 MG tablet   Other Relevant Orders   CBC with Differential/Platelet (Completed)   Comprehensive metabolic  panel (Completed)   Brain natriuretic peptide (Completed)    I have discontinued Brenon E. Pant's magnesium citrate, celecoxib, ondansetron, and amitriptyline. I am also having him start on furosemide. Additionally, I am having him maintain his fluticasone, oxyCODONE, and tamsulosin.  Meds ordered this encounter  Medications   furosemide (LASIX) 20 MG tablet    Sig: Take 1 tablet (20 mg total) by mouth daily.    Dispense:  30 tablet    Refill:  0    Order Specific Question:   Supervising Provider    Answer:   Pricilla Holm A [[id number]]

## 2022-01-10 ENCOUNTER — Encounter: Payer: Self-pay | Admitting: Internal Medicine

## 2022-01-10 ENCOUNTER — Ambulatory Visit: Payer: Medicare Other | Admitting: Internal Medicine

## 2022-01-10 DIAGNOSIS — R600 Localized edema: Secondary | ICD-10-CM

## 2022-01-10 DIAGNOSIS — I5032 Chronic diastolic (congestive) heart failure: Secondary | ICD-10-CM

## 2022-01-10 DIAGNOSIS — I10 Essential (primary) hypertension: Secondary | ICD-10-CM

## 2022-01-10 NOTE — Progress Notes (Signed)
   Subjective:   Patient ID: [name], male    DOB: June 23, 1932, 88 y.o.   MRN: [id number]  HPI The patient is an 88 YO man coming in for follow up.  Review of Systems  Constitutional: Negative.   HENT: Negative.    Eyes: Negative.   Respiratory:  Negative for cough, chest tightness and shortness of breath.   Cardiovascular:  Negative for chest pain, palpitations and leg swelling.  Gastrointestinal:  Negative for abdominal distention, abdominal pain, constipation, diarrhea, nausea and vomiting.  Musculoskeletal:  Positive for arthralgias, back pain, myalgias and neck pain.  Skin: Negative.   Neurological: Negative.   Psychiatric/Behavioral: Negative.     Objective:  Physical Exam Constitutional:      Appearance: He is well-developed.  HENT:     Head: Normocephalic and atraumatic.  Cardiovascular:     Rate and Rhythm: Normal rate and regular rhythm.  Pulmonary:     Effort: Pulmonary effort is normal. No respiratory distress.     Breath sounds: Normal breath sounds. No wheezing or rales.  Abdominal:     General: Bowel sounds are normal. There is no distension.     Palpations: Abdomen is soft.     Tenderness: There is no abdominal tenderness. There is no rebound.  Musculoskeletal:        General: Tenderness present.     Cervical back: Normal range of motion.  Skin:    General: Skin is warm and dry.  Neurological:     Mental Status: He is alert and oriented to person, place, and time.     Coordination: Coordination abnormal.     Comments: Walker and gait is improved from 2022    Vitals:   01/10/22 1037  BP: 134/78  Pulse: (!) 101  Resp: 18  SpO2: 97%  Weight: 196 lb 3.2 oz (89 kg)  Height: 6' (1.829 m)    Assessment & Plan:

## 2022-01-11 MED ORDER — FUROSEMIDE 20 MG PO TABS: 20.0000 mg | ORAL_TABLET | Freq: Every day | ORAL | 1 refills | Status: DC

## 2022-01-11 NOTE — Assessment & Plan Note (Signed)
Previously last year had low BP, he is now taking lasix 20 mg daily and BP is at goal. This was moderately elevated in the last two weeks. He is drinking a significant amount of alcohol to help self medicate pain recently and this could be related. He is advised he should not drink alcohol at all due to his oxycodone daily dosing. He will think about cutting back. Continue lasix 20 mg daily for now. Labs reviewed with patient from last week.

## 2022-01-11 NOTE — Assessment & Plan Note (Signed)
This is improved with lasix 20 mg daily and will continue for now. Refilled today.

## 2022-02-02 ENCOUNTER — Telehealth: Payer: Self-pay | Admitting: Internal Medicine

## 2022-02-05 ENCOUNTER — Ambulatory Visit: Payer: Medicare Other | Admitting: Podiatry

## 2022-02-05 ENCOUNTER — Ambulatory Visit: Payer: Medicare Other

## 2022-02-05 DIAGNOSIS — L84 Corns and callosities: Secondary | ICD-10-CM

## 2022-02-05 DIAGNOSIS — G629 Polyneuropathy, unspecified: Secondary | ICD-10-CM

## 2022-02-05 DIAGNOSIS — M216X2 Other acquired deformities of left foot: Secondary | ICD-10-CM

## 2022-02-05 NOTE — Progress Notes (Deleted)
    Subjective:    Patient ID: [name], male    DOB: 07-16-1932, 88 y.o.   MRN: [id number]      HPI [name] is here for No chief complaint on file.   High BP -      Medications and allergies reviewed with patient and updated if appropriate.  Current Outpatient Medications on File Prior to Visit  Medication Sig Dispense Refill   fluticasone (FLONASE) 50 MCG/ACT nasal spray Place 1 spray into both nostrils daily as needed for allergies.     furosemide (LASIX) 20 MG tablet Take 1 tablet (20 mg total) by mouth daily. 90 tablet 1   oxyCODONE (OXY IR/ROXICODONE) 5 MG immediate release tablet Take 1 tablet (5 mg total) by mouth every 4 (four) hours as needed for breakthrough pain or severe pain. 15 tablet 0   tamsulosin (FLOMAX) 0.4 MG CAPS capsule TAKE 1 CAPSULE BY MOUTH EVERY DAY 90 capsule 1   No current facility-administered medications on file prior to visit.    Review of Systems     Objective:  There were no vitals filed for this visit. BP Readings from Last 3 Encounters:  01/10/22 134/78  01/03/22 (!) 150/82  09/05/21 134/80   Wt Readings from Last 3 Encounters:  01/10/22 196 lb 3.2 oz (89 kg)  06/19/21 187 lb (84.8 kg)  03/07/21 181 lb 3.2 oz (82.2 kg)   There is no height or weight on file to calculate BMI.    Physical Exam         Assessment & Plan:    See Problem List for Assessment and Plan of chronic medical problems.

## 2022-02-06 ENCOUNTER — Ambulatory Visit: Payer: Medicare Other | Admitting: Internal Medicine

## 2022-02-11 NOTE — Progress Notes (Signed)
Subjective:   Patient ID: [name], male   DOB: 88 y.o.   MRN: [id number]   HPI 88 year old male presents the office today for concerns of a painful skin lesion, callus on the left foot.  Denies any swelling or redness or any drainage coming from the area.  No open lesions.   Review of Systems  All other systems reviewed and are negative.  Past Medical History:  Diagnosis Date   ACTINIC KERATOSIS, HEAD 03/23/2008   ALLERGIC RHINITIS 03/27/2007   Arthritis    BENIGN PROSTATIC HYPERTROPHY, WITH URINARY OBSTRUCTION 10/16/2007   CAD 08/18/2007   "patient denies any issues with heart, does not see cardiologist"   Carpal tunnel syndrome, bilateral 09/04/2016   CARPAL TUNNEL SYNDROME, LEFT 04/13/2008   CHF (congestive heart failure) (HCC)    Chronic back pain    Chronic neck pain    Complication of anesthesia    left lower jaw teeth have been injured in the past and he has lost those, now has 2 loose front teeth due to a mouth injury on 01/02/20   DVT (deep venous thrombosis) (HCC)    ERECTILE DYSFUNCTION, ORGANIC 02/20/2010   Gout, unspecified 02/20/2010   Headache(784.0)    migraines hx of- started at age 73- none since 4   History of melanoma    HYPERLIPIDEMIA, WITH HIGH HDL 10/18/2008   HYPERTENSION 05/09/2007   sees Dr. Benay Pillow   KNEE PAIN 10/10/2009   LOW BACK PAIN 03/27/2007   NEOPLASM, MALIGNANT, SKIN, TRUNK 05/09/2007   squamous cell on scalp, sideburn and ear (right). meyloma on truckl.   Patellar tendinitis 10/10/2009   Sciatic nerve pain    on Oxycodone as needed   SKIN CANCER, HX OF 03/27/2007   UNS ADVRS EFF UNS RX MEDICINAL&BIOLOGICAL SBSTNC 08/18/2007   Urinary urgency     Past Surgical History:  Procedure Laterality Date   APPENDECTOMY  0962   APPLICATION OF WOUND VAC Left 01/19/2020   Procedure: Application Of Wound Vac;  Surgeon: [name], MD;  Location: [place];  Service: Orthopedics;  Laterality: Left;   BLEPHAROPLASTY     CARPAL TUNNEL RELEASE Right     CHOLECYSTECTOMY     ESOPHAGOGASTRODUODENOSCOPY (EGD) WITH PROPOFOL N/A 04/26/2015   Procedure: ESOPHAGOGASTRODUODENOSCOPY (EGD) WITH PROPOFOL;  Surgeon: Inda Castle, MD;  [facility];  Service: Endoscopy;  Laterality: N/A;   EYE SURGERY     bilateral cataracts removed and vitrectomies   JOINT REPLACEMENT Right    knee   KNEE ARTHROSCOPY     KYPHOPLASTY N/A 06/10/2020   Procedure: Thoracic Twelve, Lumbar One, Lumbar Two Kyphoplasty;  Surgeon: Ashok Pall, MD;  Location: [place];  Service: Neurosurgery;  Laterality: N/A;  3C/RM 21   KYPHOPLASTY N/A 09/07/2020   Procedure: Lumbar three KYPHOPLASTY;  Surgeon: Ashok Pall, MD;  Location: [place];  Service: Neurosurgery;  Laterality: N/A;   LAMINECTOMY     LATERAL FUSION LUMBAR SPINE, TRANSVERSE     LUMBAR LAMINECTOMY/DECOMPRESSION MICRODISCECTOMY Left 11/03/2012   Procedure: LUMBAR LAMINECTOMY/DECOMPRESSION MICRODISCECTOMY 1 LEVEL;  Surgeon: Hosie Spangle, MD;  Location: [place] [facility];  Service: Neurosurgery;  Laterality: Left;  LEFT L5S1 laminotomy foraminotomy and possible microdiskectomy   MELANOMA EXCISION     scalp   NASAL SINUS SURGERY     ORIF PATELLA Left 01/19/2020   Procedure: LEFT OPEN REDUCTION INTERNAL (ORIF) FIXATION PATELLA WITH AUTO GRAFTING USING HAMSTRING REINFORCEMENT;  Surgeon: [name], MD;  Location: [place];  Service:  Orthopedics;  Laterality: Left;   POSTERIOR CERVICAL FUSION/FORAMINOTOMY Right 10/01/2012   Procedure: POSTERIOR CERVICAL FUSION/FORAMINOTOMY LEVEL 1;  Surgeon: Hosie Spangle, MD;  Location: [place] [facility];  Service: Neurosurgery;  Laterality: Right;  Right Cervical seven thoracic one Cervical laminectomy/foraminotomy with posterior cervical arthrodesis    POSTERIOR LUMBAR FUSION N/A 05/23/2013   Procedure: exploration of lumbar wound, explantation of left interbody implant.;  Surgeon: Hosie Spangle, MD;  Location: [place];  Service: Neurosurgery;  Laterality: N/A;   RECTAL SURGERY      Fissure   TOTAL KNEE ARTHROPLASTY Left 05/07/2019   Procedure: LEFT TOTAL KNEE ARTHROPLASTY;  Surgeon: [name], MD;  Location: [place];  Service: Orthopedics;  Laterality: Left;   VITRECTOMY     right and left 2013     Current Outpatient Medications:    fluticasone (FLONASE) 50 MCG/ACT nasal spray, Place 1 spray into both nostrils daily as needed for allergies., Disp: , Rfl:    furosemide (LASIX) 20 MG tablet, Take 1 tablet (20 mg total) by mouth daily., Disp: 90 tablet, Rfl: 1   oxyCODONE (OXY IR/ROXICODONE) 5 MG immediate release tablet, Take 1 tablet (5 mg total) by mouth every 4 (four) hours as needed for breakthrough pain or severe pain., Disp: 15 tablet, Rfl: 0   tamsulosin (FLOMAX) 0.4 MG CAPS capsule, TAKE 1 CAPSULE BY MOUTH EVERY DAY, Disp: 90 capsule, Rfl: 1  Allergies  Allergen Reactions   Amlodipine Swelling    Ankle swelling   Lyrica [Pregabalin] Swelling    Taking sometime as needed now   Gabapentin Other (See Comments)    Lower extremity edema          Objective:  Physical Exam  General: AAO x3, NAD  Dermatological: Thick hyperkeratotic lesion present plantar aspect left foot.  Upon debridement there is no ongoing ulceration drainage or any signs of infection noted.  There is no open lesions.  Vascular: Dorsalis Pedis artery and Posterior Tibial artery pedal pulses are 2/4 bilateral with immedate capillary fill time.  There is no pain with calf compression, warmth, erythema.  Chronic bilateral lower extremity edema present.  Neruologic: Sensation decreased with Semmes Weinstein monofilament.  Musculoskeletal: Prominent metatarsal heads.  Tenderness on the skin lesion but no other areas of discomfort.  Muscular strength 5/5 in all groups tested bilateral.      Assessment:   Preulcerative callus left foot     Plan:  -Treatment options discussed including all alternatives, risks, and complications -Etiology of symptoms were discussed -Sharp  debride the lesion without any complications or bleeding.  Recommend moisturizer and offloading daily.  Discussed orthotics however not covered by insurance.  Continue offloading pads.  Discussed daily foot inspection  Trula Slade DPM

## 2022-02-20 ENCOUNTER — Other Ambulatory Visit: Payer: Self-pay | Admitting: Internal Medicine

## 2022-03-19 ENCOUNTER — Encounter: Payer: Self-pay | Admitting: Podiatry

## 2022-03-20 ENCOUNTER — Telehealth: Payer: Self-pay | Admitting: Podiatry

## 2022-03-20 NOTE — Telephone Encounter (Signed)
Can we schedule him on the casting schedule for adjustments to the insert? Thanks.

## 2022-03-20 NOTE — Telephone Encounter (Signed)
Pt already has an appt set for tomorrow for casting but does not want to be fitted for ortho, is it ok for him to come for that block or does he need to see you?

## 2022-03-20 NOTE — Telephone Encounter (Signed)
Pt was seen in the office for a callus removal, states he received a block to relieve the pressure off that side of the foot. He received an extra block for a new pair of shoes that he had purchased. Pt states that he was told by Dr. Jacqualyn Posey that he could come in & have the block adjusted for his new shoes, if he needed assistance. He wants to know if he can come by today around 2 pm or does he need an appt? Please advise.

## 2022-03-21 ENCOUNTER — Other Ambulatory Visit: Payer: Medicare Other

## 2022-04-02 ENCOUNTER — Ambulatory Visit (INDEPENDENT_AMBULATORY_CARE_PROVIDER_SITE_OTHER): Payer: Medicare Other

## 2022-04-02 ENCOUNTER — Ambulatory Visit: Payer: Self-pay

## 2022-04-02 ENCOUNTER — Ambulatory Visit (INDEPENDENT_AMBULATORY_CARE_PROVIDER_SITE_OTHER): Payer: Medicare Other | Admitting: Sports Medicine

## 2022-04-02 VITALS — BP 110/80 | HR 91 | Ht 72.0 in | Wt 196.0 lb

## 2022-04-02 DIAGNOSIS — M25532 Pain in left wrist: Secondary | ICD-10-CM

## 2022-04-02 NOTE — Telephone Encounter (Signed)
Do we have any inserts here that we can try to help offload his callus? I know we use to have some here we could try.

## 2022-04-02 NOTE — Progress Notes (Signed)
[name] D.[place] Phone: [phone]   Assessment and Plan:     1. Left wrist pain -Chronic with exacerbation, initial sports medicine visit - Flare of left wrist pain with multiple significant findings on x-ray imaging including collapse of scaphoid, significant osteoarthritis in carpal bones, CMC, first MCP, IP, postsurgical changes to third and fourth MCP - Patient had large ganglion cyst over palmar surface of left wrist that he states been present for years.  On ultrasound, edema with surrounding median and ulnar nerves.  It is possible that this pressure from this fluid was contributing to patient's pain and "burning" sensations in his wrist.  Patient elected for aspiration and drainage of ganglion cyst.  Tolerated well per note below - May use chronic pain medicine including Percocet for daily pain relief per pain management.  Continue follow-up with [facility] pain management - May use topical Voltaren gel as needed - DG Wrist Complete Left; Future    Procedure: Ultrasound Guided Ganglion Cyst Aspiration Side: Left Diagnosis: Ganglion cyst [place] Indication:  - accuracy is paramount for diagnosis - to ensure therapeutic efficacy or procedural success - to reduce procedural risk  After explaining the procedure, viable alternatives, risks, and answering any questions, consent was given verbally. The site was cleaned with chlorhexidine prep. An ultrasound transducer was placed on the proximal palmar surface of cyst.    Using sterile technique and equipment,  20 ml of fluid was removed.  The fluid was  thick, mildly yellow which is typical of ganglion cyst and was not sent for analysis.  Needle was removed and dressing placed and post injection instructions were given including possible pain after anesthetic wears off.  Pt was advised to call or return to clinic if these symptoms worsen or fail to improve as  anticipated.   Pertinent previous records reviewed include pain medicine note 03/27/2022   Follow Up: 2 weeks for reevaluation and review if ganglion cyst has recurred and if aspiration help with patient's symptoms.   Subjective:   I, [name], am serving as a Education administrator for Doctor Glennon Mac  Chief Complaint: left wrist pain   HPI:   04/02/22 Patient is a 88 year old male complaining of left wrist pain. Patient states that he has a burning sensation in his left wrist  for about 2 weeks after he hit the door jam has pain in the wrist has a big cyst on his wrist that he wants to talk about  , wants to talk about a shoulder replacement    Relevant Historical Information: Hypertension, CAD, CHF, gout  Additional pertinent review of systems negative.   Current Outpatient Medications:    fluticasone (FLONASE) 50 MCG/ACT nasal spray, Place 1 spray into both nostrils daily as needed for allergies., Disp: , Rfl:    furosemide (LASIX) 20 MG tablet, Take 1 tablet (20 mg total) by mouth daily., Disp: 90 tablet, Rfl: 1   oxyCODONE (OXY IR/ROXICODONE) 5 MG immediate release tablet, Take 1 tablet (5 mg total) by mouth every 4 (four) hours as needed for breakthrough pain or severe pain., Disp: 15 tablet, Rfl: 0   tamsulosin (FLOMAX) 0.4 MG CAPS capsule, TAKE 1 CAPSULE BY MOUTH EVERY DAY, Disp: 90 capsule, Rfl: 1   Objective:     Vitals:   04/02/22 1419  BP: 110/80  Pulse: 91  SpO2: 96%  Weight: 196 lb (88.9 kg)  Height: 6' (1.829 m)  Body mass index is 26.58 kg/m.    Physical Exam:    General: Appears well, nad, nontoxic and pleasant Neuro:sensation intact, strength is 5/5 with df/pf/inv/ev, muscle tone wnl Skin:no susupicious lesions or rashes  Left wrist: 2 by 4 cm ganglion cyst over palmar surface of left wrist without lesion, erythema, warmth, drainage ROM  Ext 70, flexion already, radial/ulnar deviation 15 TTP dorsal carpals, volar carpals, ulnar styloid, first  MCP nttp over the snauff box,  , radial styloid,   tfcc     Electronically signed by:  [name] D.[facility] 3:20 PM 04/02/22

## 2022-04-02 NOTE — Patient Instructions (Addendum)
Good to see you  Recommend getting Voltaren gel over areas of pain  2 week follow up

## 2022-04-09 ENCOUNTER — Encounter: Payer: Self-pay | Admitting: Podiatry

## 2022-04-11 ENCOUNTER — Ambulatory Visit (INDEPENDENT_AMBULATORY_CARE_PROVIDER_SITE_OTHER): Payer: Medicare Other

## 2022-04-11 ENCOUNTER — Ambulatory Visit: Payer: Medicare Other | Admitting: Podiatry

## 2022-04-11 DIAGNOSIS — L84 Corns and callosities: Secondary | ICD-10-CM | POA: Diagnosis not present

## 2022-04-11 DIAGNOSIS — L97322 Non-pressure chronic ulcer of left ankle with fat layer exposed: Secondary | ICD-10-CM | POA: Diagnosis not present

## 2022-04-11 DIAGNOSIS — M79672 Pain in left foot: Secondary | ICD-10-CM

## 2022-04-11 MED ORDER — CEPHALEXIN 500 MG PO CAPS: 500.0000 mg | ORAL_CAPSULE | Freq: Three times a day (TID) | ORAL | 0 refills | Status: DC

## 2022-04-13 NOTE — Progress Notes (Unsigned)
    [name].[place] Phone: [phone]   Assessment and Plan:     There are no diagnoses linked to this encounter.  ***   Pertinent previous records reviewed include ***   Follow Up: ***     Subjective:   I, [name], am serving as a Education administrator for [name]   Chief Complaint: left wrist pain    HPI:    04/02/22 Patient is a 88 year old male complaining of left wrist pain. Patient states that he has a burning sensation in his left wrist  for about 2 weeks after he hit the door jam has pain in the wrist has a big cyst on his wrist that he wants to talk about  , wants to talk about a shoulder replacement    9/5/023 Patient states    Relevant Historical Information: Hypertension, CAD, CHF, gout  Additional pertinent review of systems negative.   Current Outpatient Medications:    cephALEXin (KEFLEX) 500 MG capsule, Take 1 capsule (500 mg total) by mouth 3 (three) times daily., Disp: 21 capsule, Rfl: 0   fluticasone (FLONASE) 50 MCG/ACT nasal spray, Place 1 spray into both nostrils daily as needed for allergies., Disp: , Rfl:    furosemide (LASIX) 20 MG tablet, Take 1 tablet (20 mg total) by mouth daily., Disp: 90 tablet, Rfl: 1   oxyCODONE (OXY IR/ROXICODONE) 5 MG immediate release tablet, Take 1 tablet (5 mg total) by mouth every 4 (four) hours as needed for breakthrough pain or severe pain., Disp: 15 tablet, Rfl: 0   tamsulosin (FLOMAX) 0.4 MG CAPS capsule, TAKE 1 CAPSULE BY MOUTH EVERY DAY, Disp: 90 capsule, Rfl: 1   Objective:     There were no vitals filed for this visit.    There is no height or weight on file to calculate BMI.    Physical Exam:    ***   Electronically signed by:  [name].[facility] 7:52 AM 04/13/22

## 2022-04-16 NOTE — Progress Notes (Signed)
Subjective: Chief Complaint  Patient presents with   Foot Problem     L ft lesion leaking fluids    88 year old male presents the office today for concerns of a wound on the medial aspect of the left ankle.  He states that he fell about 4 days ago and sustained a superficial laceration that he states is continuing to drain.  No purulence but states is more clear drainage.  No recent treatment.  Callus in the left foot is also causing discomfort most with walking and pressure.  Objective: AAO x3, NAD DP/PT pulses palpable bilaterally, CRT less than 3 seconds On the left ankle is a superficial laceration approximately 0.6 cm in length.  There is no probing to bone or tunneling.  Rim of erythema without any ascending cellulitis.  Some clear drainage is noted today but there is no purulence.   On the plantar aspect the left foot submetatarsal is a hyperkeratotic lesion but there is no underlying ulceration drainage or signs of infection.  No other open lesions or preulcerative lesions noted. Prominent metatarsal head plantarly. No pain with calf compression, swelling, warmth, erythema  Assessment: Superficial laceration with localized erythema left ankle, preulcerative callus left foot  Plan: -All treatment options discussed with the patient including all alternatives, risks, complications.  -Patient was inquired about suturing the laceration but again this point I would recommend not to do this.  Single Steri-Strip was applied to help with the wound but this can also facilitate drainage if needed.  Restart antibiotics given the drainage as well.  Prescribed Keflex.  Monitor for any signs or symptoms of worsening drainage or infection to call the office immediately should any occur or go to the emergency room. -Debrided the hyperkeratotic lesion left foot without any complications or bleeding.  I dispensed a gel metatarsal offloading pad.  He is also wearing slip on shoes without much support  which I think is aggravating his symptoms.  Unfortunately worse is because he gets chronic swelling to his legs and ankles. -Patient encouraged to call the office with any questions, concerns, change in symptoms.   Trula Slade DPM

## 2022-04-17 ENCOUNTER — Ambulatory Visit (INDEPENDENT_AMBULATORY_CARE_PROVIDER_SITE_OTHER): Payer: Medicare Other | Admitting: Sports Medicine

## 2022-04-17 VITALS — BP 120/78 | HR 82 | Ht 72.0 in | Wt 196.0 lb

## 2022-04-17 DIAGNOSIS — M25532 Pain in left wrist: Secondary | ICD-10-CM | POA: Diagnosis not present

## 2022-04-17 NOTE — Patient Instructions (Signed)
Good to see you   

## 2022-04-20 ENCOUNTER — Telehealth: Payer: Self-pay | Admitting: Internal Medicine

## 2022-04-20 DIAGNOSIS — G8929 Other chronic pain: Secondary | ICD-10-CM

## 2022-04-20 NOTE — Telephone Encounter (Signed)
What kind of walker is he requesting? (Normal walker, rolling walker with seat, etc).

## 2022-04-20 NOTE — Telephone Encounter (Signed)
Patient would like a prescription for a new walker - he will be glad to pick up the rx.  Please advise

## 2022-04-23 ENCOUNTER — Encounter: Payer: Self-pay | Admitting: Podiatry

## 2022-04-23 ENCOUNTER — Encounter: Payer: Self-pay | Admitting: Internal Medicine

## 2022-04-23 NOTE — Telephone Encounter (Signed)
Order done for DME can let [employer] know. I see from [employer] that he got wheelchair from insurance. They may not cover a walker but we can try.

## 2022-04-23 NOTE — Telephone Encounter (Signed)
Message sent to [employer] today via community message. 

## 2022-04-23 NOTE — Telephone Encounter (Signed)
Pt has stated "DOCTOR CRAWFORD...Marland KitchenMarland KitchenI CAME ACROSS SOME INFO THAT WE SAVED WHEN WE ACQUIRED A WHEEL CHAIR SEVERAL YEARS AGO . IT , AT THAT TIME, WAS ORDERED BY OUR PCP, AND WAS DELIVERED TO [place] AT NO COST! THE NAME OF THE CO. WAS [facility] IN [place]. THE # IS  [phone] . MAYBE THIS WILL HELP. "

## 2022-04-24 ENCOUNTER — Ambulatory Visit: Payer: Medicare Other | Admitting: Podiatry

## 2022-05-01 ENCOUNTER — Encounter: Payer: Self-pay | Admitting: Internal Medicine

## 2022-05-01 ENCOUNTER — Ambulatory Visit: Payer: Medicare Other | Admitting: Internal Medicine

## 2022-05-01 VITALS — BP 110/62 | HR 95 | Temp 98.0°F | Ht 72.0 in | Wt 192.0 lb

## 2022-05-01 DIAGNOSIS — R220 Localized swelling, mass and lump, head: Secondary | ICD-10-CM | POA: Diagnosis not present

## 2022-05-01 DIAGNOSIS — L03113 Cellulitis of right upper limb: Secondary | ICD-10-CM

## 2022-05-01 DIAGNOSIS — T7840XA Allergy, unspecified, initial encounter: Secondary | ICD-10-CM | POA: Diagnosis not present

## 2022-05-01 MED ORDER — AMOXICILLIN-POT CLAVULANATE 875-125 MG PO TABS: 1.0000 | ORAL_TABLET | Freq: Two times a day (BID) | ORAL | 0 refills | Status: AC

## 2022-05-01 MED ORDER — METHYLPREDNISOLONE ACETATE 40 MG/ML IJ SUSP
40.0000 mg | Freq: Once | INTRAMUSCULAR | Status: AC
  Administered 2022-05-01: 40 mg via INTRAMUSCULAR

## 2022-05-01 NOTE — Progress Notes (Signed)
Subjective:    Patient ID: [name], male    DOB: [birth year], 88 y.o.   MRN: [id number]      HPI Kalev is here for  Chief Complaint  Patient presents with   Fall    3 weeks ago; Patient fell and scrapped arm on [place]   Allergic Reaction    Swollen lips     Three weeks ago - fell and tore up his right upper arm -tripped going out back door and scraped it against the stairs.  -  put on neoporoin.  It was healring up and then started to turn red and look like it was not healing well about 1 week ago.  He was concerned about infection.  He denies any arm pain.  Swelling, redness, dryness-last night his lips felt dry, red and swollen.  He denies any prior episodes.  He denies any pain.  He denies any sore throat or difficulty swallowing.  He did have a little lump in his left upper lip that broke open 3 to 4 days ago and it did bleed, but there is no pus.  He has been applying Vaseline    Medications and allergies reviewed with patient and updated if appropriate.  Current Outpatient Medications on File Prior to Visit  Medication Sig Dispense Refill   cephALEXin (KEFLEX) 500 MG capsule Take 1 capsule (500 mg total) by mouth 3 (three) times daily. 21 capsule 0   fluticasone (FLONASE) 50 MCG/ACT nasal spray Place 1 spray into both nostrils daily as needed for allergies.     furosemide (LASIX) 20 MG tablet Take 1 tablet (20 mg total) by mouth daily. 90 tablet 1   oxyCODONE (OXY IR/ROXICODONE) 5 MG immediate release tablet Take 1 tablet (5 mg total) by mouth every 4 (four) hours as needed for breakthrough pain or severe pain. 15 tablet 0   tamsulosin (FLOMAX) 0.4 MG CAPS capsule TAKE 1 CAPSULE BY MOUTH EVERY DAY 90 capsule 1   No current facility-administered medications on file prior to visit.    Review of Systems  Constitutional:  Positive for diaphoresis. Negative for chills and fever.  HENT:  Negative for sore throat and trouble swallowing.   Respiratory:  Negative  for cough, shortness of breath and wheezing.   Cardiovascular:  Negative for chest pain and palpitations.  Gastrointestinal:  Positive for abdominal pain.  Neurological:  Negative for light-headedness and headaches.       Objective:   Vitals:   05/01/22 0950  BP: 110/62  Pulse: 95  Temp: 98 F (36.7 C)  SpO2: 97%   BP Readings from Last 3 Encounters:  05/01/22 110/62  04/17/22 120/78  04/02/22 110/80   Wt Readings from Last 3 Encounters:  05/01/22 192 lb (87.1 kg)  04/17/22 196 lb (88.9 kg)  04/02/22 196 lb (88.9 kg)   Body mass index is 26.04 kg/m.    Physical Exam Constitutional:      General: He is not in acute distress.    Appearance: Normal appearance. He is not ill-appearing.  HENT:     Head: Normocephalic and atraumatic.     Mouth/Throat:     Comments: Mild generalized lip swelling with surrounding erythema and dryness.  Just above the left medial lip there is a small skin lump that is not tender and is not bleeding and there is no discharge.  No oral lesions Cardiovascular:     Rate and Rhythm: Normal rate and regular rhythm.  Pulmonary:  Effort: Pulmonary effort is normal.     Breath sounds: No wheezing or rales.  Skin:    General: Skin is warm and dry.     Findings: Erythema (Generalized erythema right upper mid arm that is warm to touch.  Scar in a couple of scabs in the area from his recent fall.  No area of fluctuance, bleeding or discharge.  No open wound) present.  Neurological:     Mental Status: He is alert.            Assessment & Plan:   Right upper extremity cellulitis: Acute He fell 3 weeks ago and scraped his right upper arm and has been applying Neosporin.  Initially he was on Keflex which may have helped, but in the last week he noticed the area was red, warm and he was concerned about infection Symptoms consistent with possible cellulitis-there is erythema and is warm to touch-denies any pain Start Augmentin 875-125 mg twice  daily x10 days He will monitor closely  Lip swelling, acute allergic reaction: Acute Symptoms started last night No new products or obvious cause of the lip swelling, redness and dryness No prior episodes Depo-Medrol 40 mg IM x1 He will let me know if this does not improve

## 2022-05-01 NOTE — Patient Instructions (Addendum)
    You had a steroid injection today    Medications changes include :   Augmentin twice daily    Your prescription(s) have been sent to your pharmacy.      Return if symptoms worsen or fail to improve.

## 2022-05-08 ENCOUNTER — Telehealth: Payer: Self-pay | Admitting: Internal Medicine

## 2022-05-08 ENCOUNTER — Ambulatory Visit: Payer: Medicare Other | Admitting: Podiatry

## 2022-05-08 NOTE — Telephone Encounter (Signed)
Patient was seen on the 19th of this month.  He wanted to report that his lips are still puffy and swollen - any suggestions?

## 2022-05-09 MED ORDER — CLOTRIMAZOLE 1 % EX CREA
1.0000 | TOPICAL_CREAM | Freq: Two times a day (BID) | CUTANEOUS | 0 refills | Status: DC
Start: 2022-05-09 — End: 2022-06-13

## 2022-05-09 MED ORDER — FLUCONAZOLE 150 MG PO TABS: 150.0000 mg | ORAL_TABLET | ORAL | 0 refills | Status: DC

## 2022-05-09 NOTE — Telephone Encounter (Signed)
Spoke with patient this morning.

## 2022-05-09 NOTE — Telephone Encounter (Signed)
Lets try fluconazole pills x 3 - 1 pill every 3 days and a topical antifungal - sent to pharmacy

## 2022-05-17 ENCOUNTER — Encounter: Payer: Self-pay | Admitting: Podiatry

## 2022-05-18 ENCOUNTER — Other Ambulatory Visit: Payer: Self-pay | Admitting: Podiatry

## 2022-05-18 MED ORDER — METHYLPREDNISOLONE 4 MG PO TBPK: ORAL_TABLET | ORAL | 0 refills | Status: DC

## 2022-06-03 ENCOUNTER — Encounter: Payer: Self-pay | Admitting: Internal Medicine

## 2022-06-03 ENCOUNTER — Encounter: Payer: Self-pay | Admitting: Podiatry

## 2022-06-04 NOTE — Telephone Encounter (Signed)
Contacted patient and spoke with him over the phone. He cx appointment due to him being fine today. Informed him that he is more than welcome to come in if anything changes.

## 2022-06-04 NOTE — Telephone Encounter (Signed)
Patient called back and states that he is feeling better and does not need to come in.

## 2022-06-13 ENCOUNTER — Other Ambulatory Visit: Payer: Self-pay | Admitting: Internal Medicine

## 2022-06-19 ENCOUNTER — Inpatient Hospital Stay (HOSPITAL_COMMUNITY)
Admission: EM | Admit: 2022-06-19 | Discharge: 2022-06-22 | DRG: 176 | Disposition: A | Discharge status: Home / Self Care | Payer: Medicare Other | Attending: Family Medicine | Admitting: Family Medicine

## 2022-06-19 ENCOUNTER — Ambulatory Visit: Payer: Medicare Other | Admitting: Internal Medicine

## 2022-06-19 ENCOUNTER — Encounter: Payer: Self-pay | Admitting: Internal Medicine

## 2022-06-19 ENCOUNTER — Encounter (HOSPITAL_COMMUNITY): Payer: Self-pay

## 2022-06-19 ENCOUNTER — Other Ambulatory Visit: Payer: Self-pay

## 2022-06-19 ENCOUNTER — Emergency Department (HOSPITAL_COMMUNITY): Payer: Medicare Other

## 2022-06-19 VITALS — BP 180/100 | HR 109 | Temp 98.3°F | Ht 73.0 in | Wt 208.0 lb

## 2022-06-19 DIAGNOSIS — Z87891 Personal history of nicotine dependence: Secondary | ICD-10-CM

## 2022-06-19 DIAGNOSIS — R0789 Other chest pain: Secondary | ICD-10-CM | POA: Diagnosis not present

## 2022-06-19 DIAGNOSIS — I10 Essential (primary) hypertension: Secondary | ICD-10-CM

## 2022-06-19 DIAGNOSIS — Z96652 Presence of left artificial knee joint: Secondary | ICD-10-CM | POA: Diagnosis present

## 2022-06-19 DIAGNOSIS — M4854XA Collapsed vertebra, not elsewhere classified, thoracic region, initial encounter for fracture: Secondary | ICD-10-CM | POA: Diagnosis present

## 2022-06-19 DIAGNOSIS — E876 Hypokalemia: Secondary | ICD-10-CM | POA: Insufficient documentation

## 2022-06-19 DIAGNOSIS — R600 Localized edema: Secondary | ICD-10-CM

## 2022-06-19 DIAGNOSIS — I2510 Atherosclerotic heart disease of native coronary artery without angina pectoris: Secondary | ICD-10-CM | POA: Diagnosis present

## 2022-06-19 DIAGNOSIS — M545 Low back pain, unspecified: Secondary | ICD-10-CM | POA: Diagnosis present

## 2022-06-19 DIAGNOSIS — I2609 Other pulmonary embolism with acute cor pulmonale: Principal | ICD-10-CM

## 2022-06-19 DIAGNOSIS — G629 Polyneuropathy, unspecified: Secondary | ICD-10-CM | POA: Diagnosis present

## 2022-06-19 DIAGNOSIS — R9431 Abnormal electrocardiogram [ECG] [EKG]: Secondary | ICD-10-CM

## 2022-06-19 DIAGNOSIS — Z85828 Personal history of other malignant neoplasm of skin: Secondary | ICD-10-CM

## 2022-06-19 DIAGNOSIS — Z66 Do not resuscitate: Secondary | ICD-10-CM | POA: Diagnosis present

## 2022-06-19 DIAGNOSIS — Z23 Encounter for immunization: Secondary | ICD-10-CM | POA: Diagnosis not present

## 2022-06-19 DIAGNOSIS — I5032 Chronic diastolic (congestive) heart failure: Secondary | ICD-10-CM

## 2022-06-19 DIAGNOSIS — I130 Hypertensive heart and chronic kidney disease with heart failure and stage 1 through stage 4 chronic kidney disease, or unspecified chronic kidney disease: Secondary | ICD-10-CM | POA: Diagnosis present

## 2022-06-19 DIAGNOSIS — I5023 Acute on chronic systolic (congestive) heart failure: Secondary | ICD-10-CM | POA: Diagnosis present

## 2022-06-19 DIAGNOSIS — N400 Enlarged prostate without lower urinary tract symptoms: Secondary | ICD-10-CM | POA: Diagnosis present

## 2022-06-19 DIAGNOSIS — I2699 Other pulmonary embolism without acute cor pulmonale: Principal | ICD-10-CM | POA: Diagnosis present

## 2022-06-19 DIAGNOSIS — Z85820 Personal history of malignant melanoma of skin: Secondary | ICD-10-CM

## 2022-06-19 DIAGNOSIS — Z981 Arthrodesis status: Secondary | ICD-10-CM

## 2022-06-19 DIAGNOSIS — G8929 Other chronic pain: Secondary | ICD-10-CM | POA: Diagnosis present

## 2022-06-19 DIAGNOSIS — N182 Chronic kidney disease, stage 2 (mild): Secondary | ICD-10-CM | POA: Diagnosis present

## 2022-06-19 DIAGNOSIS — I2489 Other forms of acute ischemic heart disease: Secondary | ICD-10-CM | POA: Diagnosis present

## 2022-06-19 DIAGNOSIS — Z888 Allergy status to other drugs, medicaments and biological substances status: Secondary | ICD-10-CM

## 2022-06-19 DIAGNOSIS — E785 Hyperlipidemia, unspecified: Secondary | ICD-10-CM | POA: Diagnosis present

## 2022-06-19 DIAGNOSIS — M109 Gout, unspecified: Secondary | ICD-10-CM | POA: Diagnosis present

## 2022-06-19 DIAGNOSIS — Z86711 Personal history of pulmonary embolism: Secondary | ICD-10-CM | POA: Diagnosis present

## 2022-06-19 DIAGNOSIS — Z8249 Family history of ischemic heart disease and other diseases of the circulatory system: Secondary | ICD-10-CM

## 2022-06-19 DIAGNOSIS — I5022 Chronic systolic (congestive) heart failure: Secondary | ICD-10-CM | POA: Diagnosis present

## 2022-06-19 DIAGNOSIS — Z79899 Other long term (current) drug therapy: Secondary | ICD-10-CM

## 2022-06-19 LAB — BASIC METABOLIC PANEL
Anion gap: 13 (ref 5–15)
BUN: 10 mg/dL (ref 8–23)
CO2: 21 mmol/L — ABNORMAL LOW (ref 22–32)
Calcium: 9.6 mg/dL (ref 8.9–10.3)
Chloride: 106 mmol/L (ref 98–111)
Creatinine, Ser: 1.07 mg/dL (ref 0.61–1.24)
GFR, Estimated: >60 mL/min (ref >60)
Glucose, Bld: 109 mg/dL — ABNORMAL HIGH (ref 70–99)
Potassium: 3.4 mmol/L — ABNORMAL LOW (ref 3.5–5.1)
Sodium: 140 mmol/L (ref 135–145)

## 2022-06-19 LAB — CBC
HCT: 43.9 % (ref 39.0–52.0)
Hemoglobin: 14.5 g/dL (ref 13.0–17.0)
MCH: 29.5 pg (ref 26.0–34.0)
MCHC: 33 g/dL (ref 30.0–36.0)
MCV: 89.4 fL (ref 80.0–100.0)
Platelets: 400 10*3/uL (ref 150–400)
RBC: 4.91 MIL/uL (ref 4.22–5.81)
RDW: 15.6 % — ABNORMAL HIGH (ref 11.5–15.5)
WBC: 9.3 10*3/uL (ref 4.0–10.5)
nRBC: 0 % (ref 0.0–0.2)

## 2022-06-19 LAB — TROPONIN I (HIGH SENSITIVITY)
Troponin I (High Sensitivity): 19 ng/L — ABNORMAL HIGH (ref <18)
Troponin I (High Sensitivity): 25 ng/L — ABNORMAL HIGH (ref <18)

## 2022-06-19 MED ORDER — OXYCODONE HCL 5 MG PO TABS
5.0000 mg | ORAL_TABLET | Freq: Once | ORAL | Status: AC
  Administered 2022-06-19: 5 mg via ORAL
  Filled 2022-06-19: qty 1

## 2022-06-19 MED ORDER — ALUM & MAG HYDROXIDE-SIMETH 200-200-20 MG/5ML PO SUSP
30.0000 mL | Freq: Once | ORAL | Status: AC
  Administered 2022-06-19: 30 mL via ORAL

## 2022-06-19 MED ORDER — ASPIRIN 81 MG PO CHEW
81.0000 mg | CHEWABLE_TABLET | Freq: Once | ORAL | Status: DC
  Filled 2022-06-19: qty 1

## 2022-06-19 MED ORDER — ALUM & MAG HYDROXIDE-SIMETH 200-200-20 MG/5ML PO SUSP
30.0000 mL | Freq: Once | ORAL | Status: AC
  Administered 2022-06-19: 30 mL via ORAL
  Filled 2022-06-19: qty 30

## 2022-06-19 NOTE — ED Provider Triage Note (Signed)
Emergency Medicine Provider Triage Evaluation Note  [name] , a 88 y.o. male  was evaluated in triage.  Pt complains of chest pressure and palpitations.  It started last night.  Seen by primary care doctor earlier today, was found to be very hypertensive despite not being on blood pressure medicine.  He denies any weakness, headache, vision changes.  He does feel short of breath..  Review of Systems  Per HPI  Physical Exam  BP (!) 211/116 (BP Location: Right Arm)   Pulse (!) 108   Temp 98.7 F (37.1 C) (Oral)   Resp 18   Ht '6\' 1"'$  (1.854 m)   Wt 94.3 kg   SpO2 96%   BMI 27.44 kg/m  Gen:   Awake, no distress   Resp:  Normal effort  MSK:   Moves extremities without difficulty  Other:  Tachycardic, upper and lower extremity pulses are symmetric  Medical Decision Making  Medically screening exam initiated at 12:06 PM.  Appropriate orders placed.  [name] was informed that the remainder of the evaluation will be completed by another provider, this initial triage assessment does not replace that evaluation, and the importance of remaining in the ED until their evaluation is complete.     Sherrill Raring, PA-C 06/19/22 1207

## 2022-06-19 NOTE — ED Triage Notes (Signed)
Coming from Cards PCP.  Reports heaviness in chest that comes and goes described as heaviness in chest but went to MD today bc it kept him awake.  Reports when arriving to MD heaviness was gone.  Reported EKG was different and sent patient to ER.

## 2022-06-19 NOTE — Progress Notes (Signed)
   Subjective:   Patient ID: [name], male    DOB: [birth year], 88 y.o.   MRN: [id number]  Chest Pain  Associated symptoms include palpitations and shortness of breath. Pertinent negatives include no abdominal pain, cough, nausea or vomiting.   The patient is an 88 YO man coming in for new chest pressure/pain since last night. Feels like heart is beating out of control. Pain is worse at times. Overall feels poorly.   Review of Systems  Constitutional:  Positive for activity change, appetite change and fatigue.  HENT: Negative.    Eyes: Negative.   Respiratory:  Positive for shortness of breath. Negative for cough and chest tightness.   Cardiovascular:  Positive for chest pain and palpitations. Negative for leg swelling.  Gastrointestinal:  Negative for abdominal distention, abdominal pain, constipation, diarrhea, nausea and vomiting.  Musculoskeletal: Negative.   Skin: Negative.   Neurological: Negative.   Psychiatric/Behavioral: Negative.      Objective:  Physical Exam Constitutional:      Appearance: He is well-developed. He is ill-appearing.  HENT:     Head: Normocephalic and atraumatic.  Cardiovascular:     Rate and Rhythm: Regular rhythm. Tachycardia present.  Pulmonary:     Effort: Pulmonary effort is normal. No respiratory distress.     Breath sounds: Normal breath sounds. No wheezing or rales.  Abdominal:     General: Bowel sounds are normal. There is no distension.     Palpations: Abdomen is soft.     Tenderness: There is no abdominal tenderness. There is no rebound.  Musculoskeletal:        General: Tenderness present.     Cervical back: Normal range of motion.  Skin:    General: Skin is warm and dry.  Neurological:     Mental Status: He is alert and oriented to person, place, and time.     Coordination: Coordination abnormal.     Vitals:   06/19/22 0932 06/19/22 0934  BP: (!) 180/100 (!) 180/100  Pulse: (!) 109   Temp: 98.3 F (36.8 C)   TempSrc:  Oral   SpO2: 95%   Weight: 208 lb (94.3 kg)   Height: '6\' 1"'$  (1.854 m)    EKG: Rate 98, axis normal, interval normal, sinus, new st or t wave changes with elevated ST in V1-V3, there is significant change compared to prior 2022   Assessment & Plan:  Visit time 25 minutes in face to face communication with patient and coordination of care, additional 10 minutes spent in record review, coordination or care, ordering tests, communicating/referring to other healthcare professionals, documenting in medical records all on the same day of the visit for total time 35 minutes spent on the visit.

## 2022-06-19 NOTE — Addendum Note (Signed)
Addended by: Macie Burows on: 06/19/2022 10:53 AM   Modules accepted: Orders

## 2022-06-19 NOTE — Assessment & Plan Note (Addendum)
EKG done and abnormal with new ST changes. Advised patient he should transport via EMS to ER and he is unsure since his wife is home alone. He went to get phone and after trying to call to arrange care for her decided to go via EMS to hospital.

## 2022-06-19 NOTE — Assessment & Plan Note (Addendum)
In setting of chest pain and new ST changes on EKG needs emergent assessment at ER. Discussed with patient and feel he should go via EMS to hospital. Called EMS and copy of EKG given to them.

## 2022-06-19 NOTE — ED Notes (Signed)
The patient asked for something for nausea

## 2022-06-19 NOTE — Assessment & Plan Note (Addendum)
BP is high today and he states has been running normal at home. New EKG changes and chest pressure have advised to go via EMS to ER. He is unsure due to care situation with his wife and has decided to go via EMS to [facility]

## 2022-06-20 ENCOUNTER — Emergency Department (HOSPITAL_COMMUNITY): Payer: Medicare Other

## 2022-06-20 ENCOUNTER — Emergency Department (HOSPITAL_BASED_OUTPATIENT_CLINIC_OR_DEPARTMENT_OTHER): Payer: Medicare Other

## 2022-06-20 ENCOUNTER — Observation Stay (HOSPITAL_COMMUNITY): Payer: Medicare Other

## 2022-06-20 DIAGNOSIS — M5416 Radiculopathy, lumbar region: Secondary | ICD-10-CM

## 2022-06-20 DIAGNOSIS — I2694 Multiple subsegmental pulmonary emboli without acute cor pulmonale: Secondary | ICD-10-CM

## 2022-06-20 DIAGNOSIS — I10 Essential (primary) hypertension: Secondary | ICD-10-CM

## 2022-06-20 DIAGNOSIS — N400 Enlarged prostate without lower urinary tract symptoms: Secondary | ICD-10-CM | POA: Diagnosis not present

## 2022-06-20 DIAGNOSIS — I5032 Chronic diastolic (congestive) heart failure: Secondary | ICD-10-CM | POA: Diagnosis not present

## 2022-06-20 DIAGNOSIS — G8929 Other chronic pain: Secondary | ICD-10-CM

## 2022-06-20 DIAGNOSIS — Z86711 Personal history of pulmonary embolism: Secondary | ICD-10-CM | POA: Diagnosis present

## 2022-06-20 DIAGNOSIS — I2699 Other pulmonary embolism without acute cor pulmonale: Secondary | ICD-10-CM | POA: Diagnosis not present

## 2022-06-20 DIAGNOSIS — E876 Hypokalemia: Secondary | ICD-10-CM | POA: Insufficient documentation

## 2022-06-20 LAB — ECHOCARDIOGRAM COMPLETE
Calc EF: 45.3 %
Height: 73 in
S' Lateral: 3.8 cm
Single Plane A2C EF: 48.6 %
Single Plane A4C EF: 41.2 %
Weight: 3328 oz

## 2022-06-20 LAB — HEPARIN LEVEL (UNFRACTIONATED)
Heparin Unfractionated: 0.64 IU/mL (ref 0.30–0.70)
Heparin Unfractionated: 0.34 IU/mL (ref 0.30–0.70)

## 2022-06-20 LAB — TROPONIN I (HIGH SENSITIVITY)
Troponin I (High Sensitivity): 19 ng/L — ABNORMAL HIGH (ref <18)
Troponin I (High Sensitivity): 21 ng/L — ABNORMAL HIGH (ref <18)

## 2022-06-20 MED ORDER — POTASSIUM CHLORIDE CRYS ER 20 MEQ PO TBCR
40.0000 meq | EXTENDED_RELEASE_TABLET | Freq: Once | ORAL | Status: AC
  Administered 2022-06-20: 40 meq via ORAL
  Filled 2022-06-20: qty 2

## 2022-06-20 MED ORDER — FUROSEMIDE 20 MG PO TABS
20.0000 mg | ORAL_TABLET | Freq: Every day | ORAL | Status: DC
  Administered 2022-06-22: 20 mg via ORAL
  Filled 2022-06-20 (×4): qty 1

## 2022-06-20 MED ORDER — OXYCODONE HCL 5 MG PO TABS
10.0000 mg | ORAL_TABLET | Freq: Every day | ORAL | Status: DC
  Administered 2022-06-21: 10 mg via ORAL
  Filled 2022-06-20: qty 2

## 2022-06-20 MED ORDER — HEPARIN BOLUS VIA INFUSION
6000.0000 [IU] | Freq: Once | INTRAVENOUS | Status: AC
  Administered 2022-06-20: 6000 [IU] via INTRAVENOUS
  Filled 2022-06-20: qty 6000

## 2022-06-20 MED ORDER — OXYCODONE HCL 5 MG PO TABS
20.0000 mg | ORAL_TABLET | Freq: Two times a day (BID) | ORAL | Status: DC
  Administered 2022-06-20 – 2022-06-22 (×4): 20 mg via ORAL
  Filled 2022-06-20 (×4): qty 4

## 2022-06-20 MED ORDER — SODIUM CHLORIDE 0.9% FLUSH
3.0000 mL | Freq: Two times a day (BID) | INTRAVENOUS | Status: DC
  Administered 2022-06-20 – 2022-06-22 (×2): 3 mL via INTRAVENOUS

## 2022-06-20 MED ORDER — OXYCODONE HCL 5 MG PO TABS: 20.0000 mg | ORAL_TABLET | Freq: Two times a day (BID) | ORAL | Status: DC

## 2022-06-20 MED ORDER — LABETALOL HCL 5 MG/ML IV SOLN
5.0000 mg | INTRAVENOUS | Status: DC | PRN
  Administered 2022-06-21 (×3): 5 mg via INTRAVENOUS
  Filled 2022-06-20 (×3): qty 4

## 2022-06-20 MED ORDER — OXYCODONE HCL 5 MG PO TABS
5.0000 mg | ORAL_TABLET | ORAL | Status: DC | PRN
  Administered 2022-06-20: 5 mg via ORAL
  Filled 2022-06-20: qty 1

## 2022-06-20 MED ORDER — IOHEXOL 350 MG/ML SOLN
100.0000 mL | Freq: Once | INTRAVENOUS | Status: AC | PRN
  Administered 2022-06-20: 75 mL via INTRAVENOUS

## 2022-06-20 MED ORDER — HEPARIN (PORCINE) 25000 UT/250ML-% IV SOLN
1650.0000 [IU]/h | INTRAVENOUS | Status: AC
  Administered 2022-06-20: 1600 [IU]/h via INTRAVENOUS
  Administered 2022-06-21: 1650 [IU]/h via INTRAVENOUS
  Filled 2022-06-20 (×3): qty 250

## 2022-06-20 MED ORDER — OXYCODONE HCL 5 MG PO TABS: 10.0000 mg | ORAL_TABLET | Freq: Three times a day (TID) | ORAL | Status: DC | PRN

## 2022-06-20 MED ORDER — FUROSEMIDE 20 MG PO TABS: 20.0000 mg | ORAL_TABLET | Freq: Every day | ORAL | Status: DC

## 2022-06-20 MED ORDER — ACETAMINOPHEN 325 MG PO TABS: 650.0000 mg | ORAL_TABLET | Freq: Four times a day (QID) | ORAL | Status: DC | PRN

## 2022-06-20 MED ORDER — TAMSULOSIN HCL 0.4 MG PO CAPS
0.4000 mg | ORAL_CAPSULE | Freq: Every day | ORAL | Status: DC
  Administered 2022-06-21 – 2022-06-22 (×2): 0.4 mg via ORAL
  Filled 2022-06-20 (×3): qty 1

## 2022-06-20 MED ORDER — ACETAMINOPHEN 650 MG RE SUPP: 650.0000 mg | Freq: Four times a day (QID) | RECTAL | Status: DC | PRN

## 2022-06-20 MED ORDER — OXYCODONE HCL 5 MG PO TABS
10.0000 mg | ORAL_TABLET | Freq: Once | ORAL | Status: AC
  Administered 2022-06-20: 10 mg via ORAL
  Filled 2022-06-20: qty 2

## 2022-06-20 MED ORDER — POLYETHYLENE GLYCOL 3350 17 G PO PACK: 17.0000 g | PACK | Freq: Every day | ORAL | Status: DC | PRN

## 2022-06-20 NOTE — Progress Notes (Signed)
Lower extremity venous attempted x2. Patient in CT first attempt. Patient not in room second attempt. Will attempt again as schedule and patient availability permits.   Darlin Coco, RDMS, RVT

## 2022-06-20 NOTE — ED Notes (Signed)
Patient provided meal.

## 2022-06-20 NOTE — Progress Notes (Signed)
  Echocardiogram 2D Echocardiogram has been performed.  [name] 06/20/2022, 3:55 PM

## 2022-06-20 NOTE — Progress Notes (Signed)
Lower extremity venous bilateral study completed.   Please see CV Proc for preliminary results.   Marjani Kobel, RDMS, RVT  

## 2022-06-20 NOTE — ED Notes (Signed)
Patient complaint sent to provider at this time regarding his desire for '20mg'$  oxycodone

## 2022-06-20 NOTE — Progress Notes (Signed)
ANTICOAGULATION CONSULT NOTE - Follow Up Consult  Pharmacy Consult for Heparin Indication: pulmonary embolus  Allergies  Allergen Reactions   Amlodipine Swelling and Other (See Comments)    Ankle swelling   Lyrica [Pregabalin] Swelling and Other (See Comments)    Hands and feet swell   Gabapentin Other (See Comments)    Lower extremity edema- knees and feet    Patient Measurements: Height: '6\' 1"'$  (185.4 cm) Weight: 94.3 kg (208 lb) IBW/kg (Calculated) : 79.9 Heparin Dosing Weight: 94kg  Vital Signs: Temp: 98 F (36.7 C) (11/08 1915) BP: 199/109 (11/08 2030) Pulse Rate: 127 (11/08 2030)  Labs: Recent Labs    06/19/22 1233 06/19/22 1620 06/20/22 1010 06/20/22 1618 06/20/22 2000  HGB 14.5  --   --   --   --   HCT 43.9  --   --   --   --   PLT 400  --   --   --   --   HEPARINUNFRC  --   --   --  0.64 0.34  CREATININE 1.07  --   --   --   --   TROPONINIHS 19* 25* 19*  --   --      Estimated Creatinine Clearance: 53.9 mL/min (by C-G formula based on SCr of 1.07 mg/dL).   Medications:  Scheduled:   aspirin  81 mg Oral Once   furosemide  20 mg Oral Daily   [START ON 06/21/2022] oxyCODONE  10 mg Oral Q1200   oxyCODONE  20 mg Oral BID   sodium chloride flush  3 mL Intravenous Q12H   [START ON 06/21/2022] tamsulosin  0.4 mg Oral Daily   Infusions:   heparin 1,600 Units/hr (06/20/22 2041)    Assessment: 89 YOM presenting with chest pain found to have an acute PE on imaging. No anticoagulation prior to admission. Pharmacy consulted to dose heparin.  Heparin level low end therapeutic on 1600 units/hr  Goal of Therapy:  Heparin level 0.3-0.7 units/ml Monitor platelets by anticoagulation protocol: Yes   Plan:  Increase heparin gtt slightly to 1650 units/hr Daily heparin level, CBC, s/s bleeding F/u long term [facility] plan  Bertis Ruddy, PharmD Clinical Pharmacist ED Pharmacist Phone # [phone] 06/20/2022 8:53 PM

## 2022-06-20 NOTE — ED Notes (Signed)
1200 heparin level sent at this time

## 2022-06-20 NOTE — ED Notes (Signed)
Ambulates to the restroom with walker at this time. Refuses bedside commode

## 2022-06-20 NOTE — Progress Notes (Signed)
ANTICOAGULATION CONSULT NOTE - Follow Up Consult  Pharmacy Consult for Heparin Indication: pulmonary embolus  Allergies  Allergen Reactions   Amlodipine Swelling    Ankle swelling   Lyrica [Pregabalin] Swelling    Taking sometime as needed now   Gabapentin Other (See Comments)    Lower extremity edema    Patient Measurements: Height: '6\' 1"'$  (185.4 cm) Weight: 94.3 kg (208 lb) IBW/kg (Calculated) : 79.9 Heparin Dosing Weight: 94kg  Vital Signs: Temp: 97.5 F (36.4 C) (11/08 1005) BP: 151/97 (11/08 1030) Pulse Rate: 104 (11/08 1200)  Labs: Recent Labs    06/19/22 1233 06/19/22 1620  HGB 14.5  --   HCT 43.9  --   PLT 400  --   CREATININE 1.07  --   TROPONINIHS 19* 25*    Estimated Creatinine Clearance: 53.9 mL/min (by C-G formula based on SCr of 1.07 mg/dL).   Medications:  Scheduled:   aspirin  81 mg Oral Once   heparin  6,000 Units Intravenous Once   Infusions:   heparin      Assessment: 60 YOM presenting with chest pain found to have an acute PE on imaging. No anticoagulation prior to admission. Pharmacy consulted to dose heparin.  Goal of Therapy:  Heparin level 0.3-0.7 units/ml Monitor platelets by anticoagulation protocol: Yes   Plan:  Heparin 6000 units IV bolus Start Heparin 1600 units/hr Check 8hr heparin level at 2000 Daily heparin level and CBC Monitor for signs/symptoms of bleeding  Merrilee Jansky, PharmD Clinical Pharmacist 06/20/2022,12:06 PM

## 2022-06-20 NOTE — H&P (Addendum)
History and Physical   [name] JKK:[id number] DOB: May 02, 1932 DOA: 06/19/2022  PCP: Hoyt Koch, MD   Patient coming from: Home/PCP office  Chief Complaint: Chest pain  HPI: [name] is a 88 y.o. male with medical history significant of hyperlipidemia, hypertension, CAD, chronic low back pain, BPH, anemia, diastolic CHF presenting with chest pain.  Patient reports several days of intermittent chest pain.  Describes the pain as more of a pressure-like sensation with some associated dyspnea that he experiences intermittently as well with minimal exertion or even at rest at times. He also reports some intermittent palpitations and pain worse with deep breathing.  Saw his PCP yesterday and was sent to the ED for further evaluation.  He denies fevers, chills, abdominal pain, constipation, diarrhea, nausea, vomiting.  ED Course: Vital signs in the ED significant for heart rate in the 90s to 100s, blood pressure in the 716R to 678L systolic.  Lab work-up included BMP with potassium 3.4, bicarb 21, glucose 109.  CBC within normal limits.  Troponin 19 and then 25 on repeat with additional repeat pending.  CTA showed acute pulmonary embolus of the right upper lobar and segmental arteries without evidence of right heart strain.  Lower extremity DVT study ordered but not yet performed.  Patient received oxycodone x2, aspirin, GI cocktail, heparin in the ED.  Review of Systems: As per HPI otherwise all other systems reviewed and are negative.  Past Medical History:  Diagnosis Date   ACTINIC KERATOSIS, HEAD 03/23/2008   ALLERGIC RHINITIS 03/27/2007   Arthritis    BENIGN PROSTATIC HYPERTROPHY, WITH URINARY OBSTRUCTION 10/16/2007   CAD 08/18/2007   "patient denies any issues with heart, does not see cardiologist"   Carpal tunnel syndrome, bilateral 09/04/2016   CARPAL TUNNEL SYNDROME, LEFT 04/13/2008   CHF (congestive heart failure) (HCC)    Chronic back pain    Chronic neck pain     Complication of anesthesia    left lower jaw teeth have been injured in the past and he has lost those, now has 2 loose front teeth due to a mouth injury on 01/02/20   DVT (deep venous thrombosis) (HCC)    ERECTILE DYSFUNCTION, ORGANIC 02/20/2010   Gout, unspecified 02/20/2010   Headache(784.0)    migraines hx of- started at age 78- none since 83   History of melanoma    HYPERLIPIDEMIA, WITH HIGH HDL 10/18/2008   HYPERTENSION 05/09/2007   sees Dr. Benay Pillow   KNEE PAIN 10/10/2009   LOW BACK PAIN 03/27/2007   NEOPLASM, MALIGNANT, SKIN, TRUNK 05/09/2007   squamous cell on scalp, sideburn and ear (right). meyloma on truckl.   Patellar tendinitis 10/10/2009   Sciatic nerve pain    on Oxycodone as needed   SKIN CANCER, HX OF 03/27/2007   UNS ADVRS EFF UNS RX MEDICINAL&BIOLOGICAL SBSTNC 08/18/2007   Urinary urgency     Past Surgical History:  Procedure Laterality Date   APPENDECTOMY  3810   APPLICATION OF WOUND VAC Left 01/19/2020   Procedure: Application Of Wound Vac;  Surgeon: [name], MD;  Location: [place];  Service: Orthopedics;  Laterality: Left;   BLEPHAROPLASTY     CARPAL TUNNEL RELEASE Right    CHOLECYSTECTOMY     ESOPHAGOGASTRODUODENOSCOPY (EGD) WITH PROPOFOL N/A 04/26/2015   Procedure: ESOPHAGOGASTRODUODENOSCOPY (EGD) WITH PROPOFOL;  Surgeon: Inda Castle, MD;  Location: [facility];  Service: Endoscopy;  Laterality: N/A;   EYE SURGERY     bilateral cataracts removed and vitrectomies  JOINT REPLACEMENT Right    knee   KNEE ARTHROSCOPY     KYPHOPLASTY N/A 06/10/2020   Procedure: Thoracic Twelve, Lumbar One, Lumbar Two Kyphoplasty;  Surgeon: Ashok Pall, MD;  Location: [place];  Service: Neurosurgery;  Laterality: N/A;  3C/RM 21   KYPHOPLASTY N/A 09/07/2020   Procedure: Lumbar three KYPHOPLASTY;  Surgeon: Ashok Pall, MD;  Location: [place];  Service: Neurosurgery;  Laterality: N/A;   LAMINECTOMY     LATERAL FUSION LUMBAR SPINE, TRANSVERSE     LUMBAR  LAMINECTOMY/DECOMPRESSION MICRODISCECTOMY Left 11/03/2012   Procedure: LUMBAR LAMINECTOMY/DECOMPRESSION MICRODISCECTOMY 1 LEVEL;  Surgeon: Hosie Spangle, MD;  Location: [place] [facility];  Service: Neurosurgery;  Laterality: Left;  LEFT L5S1 laminotomy foraminotomy and possible microdiskectomy   MELANOMA EXCISION     scalp   NASAL SINUS SURGERY     ORIF PATELLA Left 01/19/2020   Procedure: LEFT OPEN REDUCTION INTERNAL (ORIF) FIXATION PATELLA WITH AUTO GRAFTING USING HAMSTRING REINFORCEMENT;  Surgeon: [name], MD;  Location: [place];  Service: Orthopedics;  Laterality: Left;   POSTERIOR CERVICAL FUSION/FORAMINOTOMY Right 10/01/2012   Procedure: POSTERIOR CERVICAL FUSION/FORAMINOTOMY LEVEL 1;  Surgeon: Hosie Spangle, MD;  Location: [place] [facility];  Service: Neurosurgery;  Laterality: Right;  Right Cervical seven thoracic one Cervical laminectomy/foraminotomy with posterior cervical arthrodesis    POSTERIOR LUMBAR FUSION N/A 05/23/2013   Procedure: exploration of lumbar wound, explantation of left interbody implant.;  Surgeon: Hosie Spangle, MD;  Location: [place];  Service: Neurosurgery;  Laterality: N/A;   RECTAL SURGERY     Fissure   TOTAL KNEE ARTHROPLASTY Left 05/07/2019   Procedure: LEFT TOTAL KNEE ARTHROPLASTY;  Surgeon: [name], MD;  Location: [place];  Service: Orthopedics;  Laterality: Left;   VITRECTOMY     right and left 2013    Social History  reports that he quit smoking about 40 years ago. His smoking use included cigarettes. He has a 33.00 pack-year smoking history. He has never used smokeless tobacco. He reports that he does not currently use alcohol. He reports that he does not use drugs.  Allergies  Allergen Reactions   Amlodipine Swelling    Ankle swelling   Lyrica [Pregabalin] Swelling    Taking sometime as needed now   Gabapentin Other (See Comments)    Lower extremity edema    Family History  Problem Relation Age of Onset   Dementia Mother     Anemia Father    Heart disease Father    Stroke Sister        ICH   Lung disease Sister   Reviewed on admission  Prior to Admission medications   Medication Sig Start Date End Date Taking? Authorizing Provider  clotrimazole (LOTRIMIN) 1 % cream APPLY TO AFFECTED AREA TWICE A DAY Patient not taking: Reported on 06/19/2022 06/13/22   Binnie Rail, MD  fluconazole (DIFLUCAN) 150 MG tablet Take 1 tablet (150 mg total) by mouth every 3 (three) days. Patient not taking: Reported on 06/19/2022 05/09/22   Binnie Rail, MD  fluticasone [facility]) 50 MCG/ACT nasal spray Place 1 spray into both nostrils daily as needed for allergies.    [provider]  furosemide (LASIX) 20 MG tablet Take 1 tablet (20 mg total) by mouth daily. Patient not taking: Reported on 06/19/2022 01/11/22   Hoyt Koch, MD  methylPREDNISolone (MEDROL DOSEPAK) 4 MG TBPK tablet Take as directed Patient not taking: Reported on 06/19/2022 05/18/22   Trula Slade, DPM  oxyCODONE (OXY IR/ROXICODONE) 5 MG immediate release tablet Take 1 tablet (5 mg total) by mouth every 4 (four) hours as needed for breakthrough pain or severe pain. 01/17/21   Elgergawy, Silver Huguenin, MD  tamsulosin (FLOMAX) 0.4 MG CAPS capsule TAKE 1 CAPSULE BY MOUTH EVERY DAY 02/20/22   Hoyt Koch, MD    Physical Exam: Vitals:   06/20/22 1200 06/20/22 1215 06/20/22 1218 06/20/22 1230  BP:  (!) 184/101 (!) 184/101 (!) 198/108  Pulse: (!) 104 91 91 90  Resp:   18 18  Temp:  (!) 97.5 F (36.4 C) (!) 97.5 F (36.4 C)   TempSrc:      SpO2: 96% 96% 97% 91%  Weight:      Height:        Physical Exam Constitutional:      General: He is not in acute distress.    Appearance: Normal appearance.  HENT:     Head: Normocephalic and atraumatic.     Mouth/Throat:     Mouth: Mucous membranes are moist.     Pharynx: Oropharynx is clear.  Eyes:     Extraocular Movements: Extraocular movements intact.     Pupils: Pupils are equal, round, and  reactive to light.  Cardiovascular:     Rate and Rhythm: Normal rate and regular rhythm.     Pulses: Normal pulses.     Heart sounds: Normal heart sounds.  Pulmonary:     Effort: Pulmonary effort is normal. No respiratory distress.     Breath sounds: Normal breath sounds.  Abdominal:     General: Bowel sounds are normal. There is no distension.     Palpations: Abdomen is soft.     Tenderness: There is no abdominal tenderness.  Musculoskeletal:        General: No swelling or deformity.     Right lower leg: Edema present.  Skin:    General: Skin is warm and dry.  Neurological:     General: No focal deficit present.     Mental Status: Mental status is at baseline.    Labs on Admission: I have personally reviewed following labs and imaging studies  CBC: Recent Labs  Lab 06/19/22 1233  WBC 9.3  HGB 14.5  HCT 43.9  MCV 89.4  PLT 403    Basic Metabolic Panel: Recent Labs  Lab 06/19/22 1233  NA 140  K 3.4*  CL 106  CO2 21*  GLUCOSE 109*  BUN 10  CREATININE 1.07  CALCIUM 9.6    GFR: Estimated Creatinine Clearance: 53.9 mL/min (by C-G formula based on SCr of 1.07 mg/dL).  Liver Function Tests: No results for input(s): "AST", "ALT", "ALKPHOS", "BILITOT", "PROT", "ALBUMIN" in the last 168 hours.  Urine analysis:    Component Value Date/Time   COLORURINE YELLOW 01/11/2021 [place] 01/11/2021 1242   LABSPEC 1.008 01/11/2021 1242   PHURINE 6.0 01/11/2021 1242   GLUCOSEU NEGATIVE 01/11/2021 1242   GLUCOSEU NEGATIVE 10/03/2018 1036   HGBUR SMALL (A) 01/11/2021 1242   BILIRUBINUR NEGATIVE 01/11/2021 1242   BILIRUBINUR negative 10/17/2018 [place] 01/11/2021 1242   PROTEINUR NEGATIVE 01/11/2021 1242   UROBILINOGEN 0.2 10/17/2018 1555   UROBILINOGEN 0.2 10/03/2018 1036   NITRITE NEGATIVE 01/11/2021 1242   LEUKOCYTESUR NEGATIVE 01/11/2021 1242    Radiological Exams on Admission: CT Angio Chest PE W and/or Wo Contrast  Result  Date: 06/20/2022 CLINICAL DATA:  PE Suspected EXAM: CT ANGIOGRAPHY CHEST WITH CONTRAST TECHNIQUE: Multidetector CT  imaging of the chest was performed using the standard protocol during bolus administration of intravenous contrast. Multiplanar CT image reconstructions and MIPs were obtained to evaluate the vascular anatomy. RADIATION DOSE REDUCTION: This exam was performed according to the departmental dose-optimization program which includes automated exposure control, adjustment of the mA and/or kV according to patient size and/or use of iterative reconstruction technique. CONTRAST:  19m OMNIPAQUE IOHEXOL 350 MG/ML SOLN COMPARISON:  CT chest 08/10/2018, MRI thoracic spine 09/26/2021 FINDINGS: Cardiovascular: Satisfactory opacification of the pulmonary arteries to the segmental level. There is evidence of PE and right upper lobe lobar and segmental pulmonary arteries (series 6, image 117). There is no evidence of right heart strain. No pericardial effusion. Moderate coronary artery calcifications. Mediastinum/Nodes: No enlarged mediastinal, hilar, or axillary lymph nodes. Thyroid gland, trachea, and esophagus demonstrate no significant findings. Lungs/Pleura: No focal consolidation or large pulmonary nodule visualized. Assessment for small pulmonary nodules is slightly limited due to respiratory motion artifact. No pleural effusion or pneumothorax. Upper Abdomen: Patient is status post cholecystectomy. There is evidence of hepatic steatosis. There are vascular calcifications of the abdominal aorta and the splenic artery. Musculoskeletal: There are compression deformities in the lower thoracic spine at the T11-T12 vertebral body levels. There are post kyphoplasty changes at the L1 and L2 levels. These are unchanged compared to MRI thoracic spine 09/26/2021. There is a ground-glass lesion of the left scapula, which likely represents a healing fracture. Review of the MIP images confirms the above findings. IMPRESSION:  Acute pulmonary embolism in the right upper lobe lobar and segmental pulmonary arteries. No evidence of right heart strain. Aortic Atherosclerosis (ICD10-I70.0). Electronically Signed   By: HMarin RobertsM.D.   On: 06/20/2022 11:27   DG Chest 2 View  Result Date: 06/19/2022 CLINICAL DATA:  Chest pain. EXAM: CHEST - 2 VIEW COMPARISON:  Chest x-ray June 4, 22. FINDINGS: The heart size and mediastinal contours are within normal limits. Both lungs are clear. No visible pleural effusions or pneumothorax. No acute osseous abnormality. Polyarticular degenerative change. Cervical spinal fusion hardware. IMPRESSION: No active cardiopulmonary disease. Electronically Signed   By: FMargaretha SheffieldM.D.   On: 06/19/2022 13:16    EKG: Independently reviewed.  Sinus tachycardia 104 bpm.  Evidence of possible LVH.  [id number]  Nonspecific T wave changes.  Assessment/Plan Principal Problem:   Acute pulmonary embolism (HCC) Active Problems:   Hyperlipidemia   Essential hypertension   CAD (coronary artery disease)   Chronic radicular low back pain   Benign prostatic hyperplasia   Chronic diastolic CHF (congestive heart failure) (HCC)   Hypokalemia   Acute pulmonary embolus > Patient presenting with several days of intermittent chest pressure and some associated dyspnea on exertion as well as pleuritic chest pain. > Intermittently tachycardic in the ED no current oxygen requirement but is subjectively symptomatic.  Also intermittent tachycardia in the ED. > PE confirmed on CTA at the right upper lobar and segmental arteries.  Also noted to have S1Q3T3 on EKG, though this may be an old finding. >  Started on heparin in the ED. - Monitor on telemetry overnight - Continue with heparin for now and likely able to transition to orals in the morning pending echo results - Echocardiogram - Follow-up DVT study  Chronic pain > History of compression fractures and surgical spinal intervention. - Continue home chronic  oxycodone as needed  BPH - Continue home Tamsulosin  Hypertension Chronic diastolic CHF > Last echo was in 2021 with EF 60 to 65%, G1  DD, normal RV function. - Currently states he is only taking Lasix rarely as he feels it is not very effective - Getting repeat echo as above - Resume Lasix as he is becoming hypertensive here  Hypokalemia > Mild hypokalemia at 3.4 in the ED. - 40 mEq p.o. potassium given he will also be restarting Lasix today. - Trend BMP  Hyperlipidemia CAD - Patient denies currently taking any medications for these and per chart review there is a note in his history that states he does not remember having CAD.  DVT prophylaxis: Heparin Code Status:   DNR Family Communication:  Attempted to reach wife by phone, but there was no answer Disposition Plan:   Patient is from:  Home  Anticipated DC to:  Home  Anticipated DC date:  1 to 2 days  Anticipated DC barriers: None  Consults called:  None Admission status:  Observation, telemetry  Severity of Illness: The appropriate patient status for this patient is OBSERVATION. Observation status is judged to be reasonable and necessary in order to provide the required intensity of service to ensure the patient's safety. The patient's presenting symptoms, physical exam findings, and initial radiographic and laboratory data in the context of their medical condition is felt to place them at decreased risk for further clinical deterioration. Furthermore, it is anticipated that the patient will be medically stable for discharge from the hospital within 2 midnights of admission.    Marcelyn Bruins MD [facility]  How to contact the [facility] Attending or Consulting provider [place] or covering provider during after hours [place], for this patient?   Check the care team in [facility] and look for a) attending/consulting [facility] provider listed and b) the [facility] team listed Log into [email or web address] and use [place]'s universal password  to access. If you do not have the password, please contact the hospital operator. Locate the [facility] provider you are looking for under [facility] and page to a number that you can be directly reached. If you still have difficulty reaching the provider, please page the [facility] (Director on Call) for the Hospitalists listed on amion for assistance.  06/20/2022, 1:09 PM

## 2022-06-20 NOTE — ED Notes (Signed)
Provider DR. Trilby Drummer notified of patients consistent hypertension

## 2022-06-20 NOTE — ED Provider Notes (Signed)
[facility] EMERGENCY DEPARTMENT Provider Note   CSN: [id number] Arrival date & time: 06/19/22  1135     History  Chief Complaint  Patient presents with   Chest Pain    [name] is a 88 y.o. male.  Pt is an 88 yo male with a pmhx significant for htn, cad, hx dvt, chf, skin cancer, and chronic pain.  Pt has been having cp intermittently for several days.  He did go to his pcp and was sent here for further eval.  Unfortunately, the patient had to wait in the [place] for 22 hours.  He said the chest sx were not pain, it was a pressure.  He said he felt like he could not breathe.  He did have some palpitations as well.  He has been hypertensive while here, but is not currently on meds for htn other than lasix.        Home Medications Prior to Admission medications   Medication Sig Start Date End Date Taking? Authorizing Provider  clotrimazole (LOTRIMIN) 1 % cream APPLY TO AFFECTED AREA TWICE A DAY Patient not taking: Reported on 06/19/2022 06/13/22   Binnie Rail, MD  fluconazole (DIFLUCAN) 150 MG tablet Take 1 tablet (150 mg total) by mouth every 3 (three) days. Patient not taking: Reported on 06/19/2022 05/09/22   Binnie Rail, MD  fluticasone [facility]) 50 MCG/ACT nasal spray Place 1 spray into both nostrils daily as needed for allergies.    [provider]  furosemide (LASIX) 20 MG tablet Take 1 tablet (20 mg total) by mouth daily. Patient not taking: Reported on 06/19/2022 01/11/22   Hoyt Koch, MD  methylPREDNISolone (MEDROL DOSEPAK) 4 MG TBPK tablet Take as directed Patient not taking: Reported on 06/19/2022 05/18/22   Trula Slade, DPM  oxyCODONE (OXY IR/ROXICODONE) 5 MG immediate release tablet Take 1 tablet (5 mg total) by mouth every 4 (four) hours as needed for breakthrough pain or severe pain. 01/17/21   Elgergawy, Silver Huguenin, MD  tamsulosin (FLOMAX) 0.4 MG CAPS capsule TAKE 1 CAPSULE BY MOUTH EVERY DAY 02/20/22   Hoyt Koch,  MD      Allergies    Amlodipine, Lyrica [pregabalin], and Gabapentin    Review of Systems   Review of Systems  Respiratory:  Positive for shortness of breath.   Cardiovascular:  Positive for chest pain.  All other systems reviewed and are negative.   Physical Exam Updated Vital Signs BP (!) 184/101   Pulse 91   Temp (!) 97.5 F (36.4 C)   Resp 18   Ht '6\' 1"'$  (1.854 m)   Wt 94.3 kg   SpO2 97%   BMI 27.44 kg/m  Physical Exam Vitals and nursing note reviewed.  Constitutional:      Appearance: He is well-developed.  HENT:     Head: Normocephalic and atraumatic.  Eyes:     Extraocular Movements: Extraocular movements intact.     Pupils: Pupils are equal, round, and reactive to light.  Cardiovascular:     Rate and Rhythm: Regular rhythm. Tachycardia present.     Heart sounds: Normal heart sounds.  Pulmonary:     Effort: Pulmonary effort is normal.     Breath sounds: Normal breath sounds.  Abdominal:     General: Bowel sounds are normal.     Palpations: Abdomen is soft.  Musculoskeletal:     Cervical back: Normal range of motion and neck supple.     Right lower  leg: Edema present.     Left lower leg: Edema present.     Comments: R leg more swollen than L  Skin:    General: Skin is warm.     Capillary Refill: Capillary refill takes less than 2 seconds.  Neurological:     General: No focal deficit present.     Mental Status: He is alert and oriented to person, place, and time.  Psychiatric:        Mood and Affect: Mood normal.        Behavior: Behavior normal.     ED Results / Procedures / Treatments   Labs (all labs ordered are listed, but only abnormal results are displayed) Labs Reviewed  BASIC METABOLIC PANEL - Abnormal; Notable for the following components:      Result Value   Potassium 3.4 (*)    CO2 21 (*)    Glucose, Bld 109 (*)    All other components within normal limits  CBC - Abnormal; Notable for the following components:   RDW 15.6 (*)     All other components within normal limits  TROPONIN I (HIGH SENSITIVITY) - Abnormal; Notable for the following components:   Troponin I (High Sensitivity) 19 (*)    All other components within normal limits  TROPONIN I (HIGH SENSITIVITY) - Abnormal; Notable for the following components:   Troponin I (High Sensitivity) 25 (*)    All other components within normal limits  TROPONIN I (HIGH SENSITIVITY) - Abnormal; Notable for the following components:   Troponin I (High Sensitivity) 19 (*)    All other components within normal limits  HEPARIN LEVEL (UNFRACTIONATED)  TROPONIN I (HIGH SENSITIVITY)    EKG EKG Interpretation  Date/Time:  Tuesday June 19 2022 11:40:25 EST Ventricular Rate:  104 PR Interval:  164 QRS Duration: 102 QT Interval:  344 QTC Calculation: 452 R Axis:   54 Text Interpretation: Sinus tachycardia Left ventricular hypertrophy with repolarization abnormality ( R in aVL , Cornell product , Romhilt-Estes ) Cannot rule out Inferior infarct , age undetermined Abnormal ECG When compared with ECG of 17-Jan-2021 05:56, Left ventricular hypertrophy with repolarization abnormality is now , probably digitalis effect Confirmed by Delora Fuel ([id number]) on 06/19/2022 11:24:22 PM  Radiology CT Angio Chest PE W and/or Wo Contrast  Result Date: 06/20/2022 CLINICAL DATA:  PE Suspected EXAM: CT ANGIOGRAPHY CHEST WITH CONTRAST TECHNIQUE: Multidetector CT imaging of the chest was performed using the standard protocol during bolus administration of intravenous contrast. Multiplanar CT image reconstructions and MIPs were obtained to evaluate the vascular anatomy. RADIATION DOSE REDUCTION: This exam was performed according to the departmental dose-optimization program which includes automated exposure control, adjustment of the mA and/or kV according to patient size and/or use of iterative reconstruction technique. CONTRAST:  17m OMNIPAQUE IOHEXOL 350 MG/ML SOLN COMPARISON:  CT chest  08/10/2018, MRI thoracic spine 09/26/2021 FINDINGS: Cardiovascular: Satisfactory opacification of the pulmonary arteries to the segmental level. There is evidence of PE and right upper lobe lobar and segmental pulmonary arteries (series 6, image 117). There is no evidence of right heart strain. No pericardial effusion. Moderate coronary artery calcifications. Mediastinum/Nodes: No enlarged mediastinal, hilar, or axillary lymph nodes. Thyroid gland, trachea, and esophagus demonstrate no significant findings. Lungs/Pleura: No focal consolidation or large pulmonary nodule visualized. Assessment for small pulmonary nodules is slightly limited due to respiratory motion artifact. No pleural effusion or pneumothorax. Upper Abdomen: Patient is status post cholecystectomy. There is evidence of hepatic steatosis. There are vascular calcifications of  the abdominal aorta and the splenic artery. Musculoskeletal: There are compression deformities in the lower thoracic spine at the T11-T12 vertebral body levels. There are post kyphoplasty changes at the L1 and L2 levels. These are unchanged compared to MRI thoracic spine 09/26/2021. There is a ground-glass lesion of the left scapula, which likely represents a healing fracture. Review of the MIP images confirms the above findings. IMPRESSION: Acute pulmonary embolism in the right upper lobe lobar and segmental pulmonary arteries. No evidence of right heart strain. Aortic Atherosclerosis (ICD10-I70.0). Electronically Signed   By: Marin Roberts M.D.   On: 06/20/2022 11:27   DG Chest 2 View  Result Date: 06/19/2022 CLINICAL DATA:  Chest pain. EXAM: CHEST - 2 VIEW COMPARISON:  Chest x-ray June 4, 22. FINDINGS: The heart size and mediastinal contours are within normal limits. Both lungs are clear. No visible pleural effusions or pneumothorax. No acute osseous abnormality. Polyarticular degenerative change. Cervical spinal fusion hardware. IMPRESSION: No active cardiopulmonary  disease. Electronically Signed   By: Margaretha Sheffield M.D.   On: 06/19/2022 13:16    Procedures Procedures    Medications Ordered in ED Medications  aspirin chewable tablet 81 mg (81 mg Oral Not Given 06/19/22 1215)  heparin bolus via infusion 6,000 Units (has no administration in time range)  heparin ADULT infusion 100 units/mL (25000 units/233m) (has no administration in time range)  oxyCODONE (Oxy IR/ROXICODONE) immediate release tablet 5 mg (has no administration in time range)  sodium chloride flush (NS) 0.9 % injection 3 mL (has no administration in time range)  acetaminophen (TYLENOL) tablet 650 mg (has no administration in time range)    Or  acetaminophen (TYLENOL) suppository 650 mg (has no administration in time range)  polyethylene glycol (MIRALAX / GLYCOLAX) packet 17 g (has no administration in time range)  alum & mag hydroxide-simeth (MAALOX/MYLANTA) 200-200-20 MG/5ML suspension 30 mL (30 mLs Oral Given 06/19/22 1222)  alum & mag hydroxide-simeth (MAALOX/MYLANTA) 200-200-20 MG/5ML suspension 30 mL (30 mLs Oral Given 06/19/22 1633)  oxyCODONE (Oxy IR/ROXICODONE) immediate release tablet 5 mg (5 mg Oral Given 06/19/22 1836)  oxyCODONE (Oxy IR/ROXICODONE) immediate release tablet 10 mg (10 mg Oral Given 06/20/22 0900)  iohexol (OMNIPAQUE) 350 MG/ML injection 100 mL (75 mLs Intravenous Contrast Given 06/20/22 1113)    ED Course/ Medical Decision Making/ A&P                           Medical Decision Making Amount and/or Complexity of Data Reviewed Labs: ordered. Radiology: ordered.  Risk OTC drugs. Prescription drug management. Decision regarding hospitalization.   This patient presents to the ED for concern of sob/cp, this involves an extensive number of treatment options, and is a complaint that carries with it a high risk of complications and morbidity.  The differential diagnosis includes cardiac, pulm, gi   Co morbidities that complicate the patient  evaluation  htn, cad, hx dvt, chf, skin cancer, and chronic pain   Additional history obtained:  Additional history obtained from epic chart review    Lab Tests:  I Ordered, and personally interpreted labs.  The pertinent results include:  cbc nl, bmp nl trop yesterday at 1233 was 19; trop at 1620 was 25   Imaging Studies ordered:  I ordered imaging studies including cxr and ct chest  I independently visualized and interpreted imaging which showed  CXR:  nothing acute CT chest:  IMPRESSION: Acute pulmonary embolism in the right upper lobe lobar and  segmental pulmonary arteries. No evidence of right heart strain.  I agree with the radiologist interpretation   Cardiac Monitoring:  The patient was maintained on a cardiac monitor.  I personally viewed and interpreted the cardiac monitored which showed an underlying rhythm of: sinus tachy   Medicines ordered and prescription drug management:  I ordered medication including heparin  for PE  Reevaluation of the patient after these medicines showed that the patient stayed the same I have reviewed the patients home medicines and have made adjustments as needed   Test Considered:  ct   Critical Interventions:  ct   Consultations Obtained:  I requested consultation with the hospitalist (Dr. Trilby Drummer),  and discussed lab and imaging findings as well as pertinent plan - he will admit.   Problem List / ED Course:  Pulmonary embolism:  Pt has a new PE.  Pt started on heparin.  He is sob with movement, so he is put on 2L.  No evidence of heart strain on CT, but troponins have bumped a little.  Troponin done this am is still pending as the machine is down.     Reevaluation:  After the interventions noted above, I reevaluated the patient and found that they have :improved   Social Determinants of Health:  Lives at home   Dispostion:  After consideration of the diagnostic results and the patients response to treatment,  I feel that the patent would benefit from admission.          Final Clinical Impression(s) / ED Diagnoses Final diagnoses:  Acute pulmonary embolism with acute cor pulmonale, unspecified pulmonary embolism type [facility])    Rx / DC Orders ED Discharge Orders     None         Isla Pence, MD 06/20/22 1232

## 2022-06-20 NOTE — ED Notes (Signed)
Patient transported to Ultrasound 

## 2022-06-21 ENCOUNTER — Other Ambulatory Visit (HOSPITAL_COMMUNITY): Payer: Self-pay

## 2022-06-21 ENCOUNTER — Telehealth (HOSPITAL_COMMUNITY): Payer: Self-pay | Admitting: Pharmacy Technician

## 2022-06-21 DIAGNOSIS — I2699 Other pulmonary embolism without acute cor pulmonale: Secondary | ICD-10-CM | POA: Diagnosis not present

## 2022-06-21 DIAGNOSIS — I2510 Atherosclerotic heart disease of native coronary artery without angina pectoris: Secondary | ICD-10-CM | POA: Diagnosis not present

## 2022-06-21 DIAGNOSIS — N400 Enlarged prostate without lower urinary tract symptoms: Secondary | ICD-10-CM | POA: Diagnosis not present

## 2022-06-21 DIAGNOSIS — I5032 Chronic diastolic (congestive) heart failure: Secondary | ICD-10-CM | POA: Diagnosis not present

## 2022-06-21 DIAGNOSIS — I5021 Acute systolic (congestive) heart failure: Secondary | ICD-10-CM

## 2022-06-21 LAB — BASIC METABOLIC PANEL
Anion gap: 11 (ref 5–15)
BUN: 13 mg/dL (ref 8–23)
CO2: 25 mmol/L (ref 22–32)
Calcium: 8.7 mg/dL — ABNORMAL LOW (ref 8.9–10.3)
Chloride: 105 mmol/L (ref 98–111)
Creatinine, Ser: 1.31 mg/dL — ABNORMAL HIGH (ref 0.61–1.24)
GFR, Estimated: 52 mL/min — ABNORMAL LOW (ref >60)
Glucose, Bld: 93 mg/dL (ref 70–99)
Potassium: 3.4 mmol/L — ABNORMAL LOW (ref 3.5–5.1)
Sodium: 141 mmol/L (ref 135–145)

## 2022-06-21 LAB — CBC
HCT: 40.5 % (ref 39.0–52.0)
Hemoglobin: 13.5 g/dL (ref 13.0–17.0)
MCH: 29.9 pg (ref 26.0–34.0)
MCHC: 33.3 g/dL (ref 30.0–36.0)
MCV: 89.6 fL (ref 80.0–100.0)
Platelets: 336 10*3/uL (ref 150–400)
RBC: 4.52 MIL/uL (ref 4.22–5.81)
RDW: 15.7 % — ABNORMAL HIGH (ref 11.5–15.5)
WBC: 7 10*3/uL (ref 4.0–10.5)
nRBC: 0 % (ref 0.0–0.2)

## 2022-06-21 LAB — HEPARIN LEVEL (UNFRACTIONATED): Heparin Unfractionated: 0.37 IU/mL (ref 0.30–0.70)

## 2022-06-21 MED ORDER — METOPROLOL TARTRATE 50 MG PO TABS
50.0000 mg | ORAL_TABLET | Freq: Two times a day (BID) | ORAL | Status: DC
  Administered 2022-06-21 – 2022-06-22 (×2): 50 mg via ORAL
  Filled 2022-06-21 (×2): qty 1

## 2022-06-21 MED ORDER — POTASSIUM CHLORIDE CRYS ER 10 MEQ PO TBCR
10.0000 meq | EXTENDED_RELEASE_TABLET | Freq: Two times a day (BID) | ORAL | Status: DC
  Administered 2022-06-21 – 2022-06-22 (×3): 10 meq via ORAL
  Filled 2022-06-21 (×4): qty 1

## 2022-06-21 MED ORDER — ASPIRIN 81 MG PO TBEC
81.0000 mg | DELAYED_RELEASE_TABLET | Freq: Every day | ORAL | Status: DC
  Administered 2022-06-21 – 2022-06-22 (×2): 81 mg via ORAL
  Filled 2022-06-21 (×2): qty 1

## 2022-06-21 MED ORDER — OXYCODONE HCL 5 MG PO TABS
10.0000 mg | ORAL_TABLET | Freq: Every day | ORAL | Status: DC | PRN
  Administered 2022-06-22: 10 mg via ORAL
  Filled 2022-06-21: qty 2

## 2022-06-21 MED ORDER — APIXABAN 5 MG PO TABS: 5.0000 mg | ORAL_TABLET | Freq: Two times a day (BID) | ORAL | Status: DC

## 2022-06-21 MED ORDER — HYDRALAZINE HCL 25 MG PO TABS
25.0000 mg | ORAL_TABLET | Freq: Three times a day (TID) | ORAL | Status: DC
  Administered 2022-06-21 – 2022-06-22 (×2): 25 mg via ORAL
  Filled 2022-06-21 (×2): qty 1

## 2022-06-21 MED ORDER — NALDEMEDINE TOSYLATE 0.2 MG PO TABS
0.2000 mg | ORAL_TABLET | Freq: Every day | ORAL | Status: DC
  Administered 2022-06-22: 0.2 mg via ORAL
  Filled 2022-06-21: qty 1

## 2022-06-21 MED ORDER — METOPROLOL TARTRATE 25 MG PO TABS
25.0000 mg | ORAL_TABLET | Freq: Two times a day (BID) | ORAL | Status: DC
  Administered 2022-06-21: 25 mg via ORAL
  Filled 2022-06-21: qty 1

## 2022-06-21 MED ORDER — LOSARTAN POTASSIUM 25 MG PO TABS
25.0000 mg | ORAL_TABLET | Freq: Every day | ORAL | Status: DC
  Administered 2022-06-21 – 2022-06-22 (×2): 25 mg via ORAL
  Filled 2022-06-21 (×2): qty 1

## 2022-06-21 MED ORDER — APIXABAN 5 MG PO TABS: 10.0000 mg | ORAL_TABLET | Freq: Two times a day (BID) | ORAL | Status: DC

## 2022-06-21 NOTE — ED Notes (Signed)
This RN requests that the patient keep his monitoring equipment on, patient continues to pull at leads and pulling them off. Patient not willing to follow instructions at this time despite best efforts to educate

## 2022-06-21 NOTE — Telephone Encounter (Signed)
Pharmacy Patient Advocate Encounter  Insurance verification completed.    The patient is insured through [employer]   The patient is currently admitted and ran test claims for the following: Eliquis.  Copays and coinsurance results were relayed to Inpatient clinical team.

## 2022-06-21 NOTE — Progress Notes (Addendum)
ANTICOAGULATION CONSULT NOTE - Follow Up Consult  Pharmacy Consult for Heparin Indication: pulmonary embolus  Allergies  Allergen Reactions   Amlodipine Swelling and Other (See Comments)    Ankle swelling   Lyrica [Pregabalin] Swelling and Other (See Comments)    Hands and feet swell   Gabapentin Other (See Comments)    Lower extremity edema- knees and feet    Patient Measurements: Height: '6\' 1"'$  (185.4 cm) Weight: 94.3 kg (208 lb) IBW/kg (Calculated) : 79.9 Heparin Dosing Weight: 94kg  Vital Signs: Temp: 98.1 F (36.7 C) (11/09 0933) Temp Source: Oral (11/09 0933) BP: 195/104 (11/09 1000) Pulse Rate: 95 (11/09 1000)  Labs: Recent Labs    06/19/22 1233 06/19/22 1620 06/20/22 1010 06/20/22 1618 06/20/22 2000 06/21/22 0545  HGB 14.5  --   --   --   --  13.5  HCT 43.9  --   --   --   --  40.5  PLT 400  --   --   --   --  336  HEPARINUNFRC  --   --   --  0.64 0.34 0.37  CREATININE 1.07  --   --   --   --  1.31*  TROPONINIHS 19* 25* 19*  --  21*  --     Estimated Creatinine Clearance: 44.1 mL/min (A) (by C-G formula based on SCr of 1.31 mg/dL (H)).   Medications:  Scheduled:   aspirin EC  81 mg Oral Daily   furosemide  20 mg Oral Daily   losartan  25 mg Oral Daily   metoprolol tartrate  25 mg Oral BID   oxyCODONE  10 mg Oral Q1200   oxyCODONE  20 mg Oral BID   potassium chloride  10 mEq Oral BID   sodium chloride flush  3 mL Intravenous Q12H   tamsulosin  0.4 mg Oral Daily   Infusions:   heparin 1,650 Units/hr (06/21/22 0548)    Assessment: 74 YOM presenting with chest pain found to have an acute PE on imaging. No anticoagulation prior to admission. Pharmacy consulted to dose heparin.  Heparin level 0.37 units/mL on heparin 1650 units/mL which is therapeutic. No signs of bleeding reported. CBC is stable.  Goal of Therapy:  Heparin level 0.3-0.7 units/ml Monitor platelets by anticoagulation protocol: Yes   Plan:  Continue heparin 1650 units/hr Daily  heparin level, CBC, s/s bleeding F/u long term AC plan  Addendum: Briefly consulted to switch to Eliquis, but now restarting heparin per cardiology. Continue with plan outlined above - monitor daily heparin levels.  Erskine Speed, PharmD Clinical Pharmacist ED Pharmacist Phone # [phone] 06/21/2022 11:30 AM

## 2022-06-21 NOTE — Consult Note (Signed)
Referring Physician: Vance Gather, MD/Melvin Sheppard Coil, MD/PCP Pricilla Holm, MD  [name] is an 88 y.o. male.                       Chief Complaint: Chest pressure  HPI: 88 years old white male with PMH of CHF, chronic back pain, HTN, HLD and Sciatic nerve pain had chest pressure intermittently over several days. He denies fever, chills and cough. His Troponin I is minimally elevated. CTA showed acute PE. EKG showed sinus tachycardia with possible old inferior wall MI.  Past Medical History:  Diagnosis Date   ACTINIC KERATOSIS, HEAD 03/23/2008   ALLERGIC RHINITIS 03/27/2007   Arthritis    BENIGN PROSTATIC HYPERTROPHY, WITH URINARY OBSTRUCTION 10/16/2007   CAD 08/18/2007   "patient denies any issues with heart, does not see cardiologist"   Carpal tunnel syndrome, bilateral 09/04/2016   CARPAL TUNNEL SYNDROME, LEFT 04/13/2008   CHF (congestive heart failure) (HCC)    Chronic back pain    Chronic neck pain    Complication of anesthesia    left lower jaw teeth have been injured in the past and he has lost those, now has 2 loose front teeth due to a mouth injury on 01/02/20   DVT (deep venous thrombosis) (HCC)    ERECTILE DYSFUNCTION, ORGANIC 02/20/2010   Gout, unspecified 02/20/2010   Headache(784.0)    migraines hx of- started at age 59- none since 89   History of melanoma    HYPERLIPIDEMIA, WITH HIGH HDL 10/18/2008   HYPERTENSION 05/09/2007   sees Dr. Benay Pillow   KNEE PAIN 10/10/2009   LOW BACK PAIN 03/27/2007   NEOPLASM, MALIGNANT, SKIN, TRUNK 05/09/2007   squamous cell on scalp, sideburn and ear (right). meyloma on truckl.   Patellar tendinitis 10/10/2009   Sciatic nerve pain    on Oxycodone as needed   SKIN CANCER, HX OF 03/27/2007   UNS ADVRS EFF UNS RX MEDICINAL&BIOLOGICAL SBSTNC 08/18/2007   Urinary urgency       Past Surgical History:  Procedure Laterality Date   APPENDECTOMY  4854   APPLICATION OF WOUND VAC Left 01/19/2020   Procedure: Application Of Wound Vac;  Surgeon:  [name], MD;  Location: [facility];  Service: Orthopedics;  Laterality: Left;   BLEPHAROPLASTY     CARPAL TUNNEL RELEASE Right    CHOLECYSTECTOMY     ESOPHAGOGASTRODUODENOSCOPY (EGD) WITH PROPOFOL N/A 04/26/2015   Procedure: ESOPHAGOGASTRODUODENOSCOPY (EGD) WITH PROPOFOL;  Surgeon: Inda Castle, MD;  Location: [facility];  Service: Endoscopy;  Laterality: N/A;   EYE SURGERY     bilateral cataracts removed and vitrectomies   JOINT REPLACEMENT Right    knee   KNEE ARTHROSCOPY     KYPHOPLASTY N/A 06/10/2020   Procedure: Thoracic Twelve, Lumbar One, Lumbar Two Kyphoplasty;  Surgeon: Ashok Pall, MD;  Location: [facility];  Service: Neurosurgery;  Laterality: N/A;  3C/RM 21   KYPHOPLASTY N/A 09/07/2020   Procedure: Lumbar three KYPHOPLASTY;  Surgeon: Ashok Pall, MD;  Location: [facility];  Service: Neurosurgery;  Laterality: N/A;   LAMINECTOMY     LATERAL FUSION LUMBAR SPINE, TRANSVERSE     LUMBAR LAMINECTOMY/DECOMPRESSION MICRODISCECTOMY Left 11/03/2012   Procedure: LUMBAR LAMINECTOMY/DECOMPRESSION MICRODISCECTOMY 1 LEVEL;  Surgeon: Hosie Spangle, MD;  Location: [facility];  Service: Neurosurgery;  Laterality: Left;  LEFT L5S1 laminotomy foraminotomy and possible microdiskectomy   MELANOMA EXCISION     scalp   NASAL SINUS SURGERY     ORIF  PATELLA Left 01/19/2020   Procedure: LEFT OPEN REDUCTION INTERNAL (ORIF) FIXATION PATELLA WITH AUTO GRAFTING USING HAMSTRING REINFORCEMENT;  Surgeon: [name], MD;  Location: [facility];  Service: Orthopedics;  Laterality: Left;   POSTERIOR CERVICAL FUSION/FORAMINOTOMY Right 10/01/2012   Procedure: POSTERIOR CERVICAL FUSION/FORAMINOTOMY LEVEL 1;  Surgeon: Hosie Spangle, MD;  Location: [facility] [place] [facility];  Service: Neurosurgery;  Laterality: Right;  Right Cervical seven thoracic one Cervical laminectomy/foraminotomy with posterior cervical arthrodesis    POSTERIOR LUMBAR FUSION N/A 05/23/2013   Procedure: exploration of lumbar wound, explantation of  left interbody implant.;  Surgeon: Hosie Spangle, MD;  Location: [facility];  Service: Neurosurgery;  Laterality: N/A;   RECTAL SURGERY     Fissure   TOTAL KNEE ARTHROPLASTY Left 05/07/2019   Procedure: LEFT TOTAL KNEE ARTHROPLASTY;  Surgeon: [name], MD;  Location: [facility];  Service: Orthopedics;  Laterality: Left;   VITRECTOMY     right and left 2013    Family History  Problem Relation Age of Onset   Dementia Mother    Anemia Father    Heart disease Father    Stroke Sister        ICH   Lung disease Sister    Social History:  reports that he quit smoking about 40 years ago. His smoking use included cigarettes. He has a 33.00 pack-year smoking history. He has never used smokeless tobacco. He reports that he does not currently use alcohol. He reports that he does not use drugs.  Allergies:  Allergies  Allergen Reactions   Amlodipine Swelling and Other (See Comments)    Ankle swelling   Lyrica [Pregabalin] Swelling and Other (See Comments)    Hands and feet swell   Gabapentin Other (See Comments)    Lower extremity edema- knees and feet    (Not in a hospital admission)   Results for orders placed or performed during the hospital encounter of 06/19/22 (from the past 48 hour(s))  Basic metabolic panel     Status: Abnormal   Collection Time: 06/19/22 12:33 PM  Result Value Ref Range   Sodium 140 135 - 145 mmol/L   Potassium 3.4 (L) 3.5 - 5.1 mmol/L   Chloride 106 98 - 111 mmol/L   CO2 21 (L) 22 - 32 mmol/L   Glucose, Bld 109 (H) 70 - 99 mg/dL    Comment: Glucose reference range applies only to samples taken after fasting for at least 8 hours.   BUN 10 8 - 23 mg/dL   Creatinine, Ser 1.07 0.61 - 1.24 mg/dL   Calcium 9.6 8.9 - 10.3 mg/dL   GFR, Estimated >60 >60 mL/min    Comment: (NOTE) Calculated using the CKD-EPI Creatinine Equation (2021)    Anion gap 13 5 - 15    Comment: Performed at [facility] [address] [place], [place] [address]  CBC      Status: Abnormal   Collection Time: 06/19/22 12:33 PM  Result Value Ref Range   WBC 9.3 4.0 - 10.5 K/uL   RBC 4.91 4.22 - 5.81 MIL/uL   Hemoglobin 14.5 13.0 - 17.0 g/dL   HCT 43.9 39.0 - 52.0 %   MCV 89.4 80.0 - 100.0 fL   MCH 29.5 26.0 - 34.0 pg   MCHC 33.0 30.0 - 36.0 g/dL   RDW 15.6 (H) 11.5 - 15.5 %   Platelets 400 150 - 400 K/uL   nRBC 0.0 0.0 - 0.2 %    Comment: Performed  at [facility], [place] [address] [place], [place] [address]  Troponin I (High Sensitivity)     Status: Abnormal   Collection Time: 06/19/22 12:33 PM  Result Value Ref Range   Troponin I (High Sensitivity) 19 (H) <18 ng/L    Comment: (NOTE) Elevated high sensitivity troponin I (hsTnI) values and significant  changes across serial measurements may suggest ACS but many other  chronic and acute conditions are known to elevate hsTnI results.  Refer to the "Links" section for chest pain algorithms and additional  guidance. Performed at [facility], [place] [address] [place], [place] [address]   Troponin I (High Sensitivity)     Status: Abnormal   Collection Time: 06/19/22  4:20 PM  Result Value Ref Range   Troponin I (High Sensitivity) 25 (H) <18 ng/L    Comment: (NOTE) Elevated high sensitivity troponin I (hsTnI) values and significant  changes across serial measurements may suggest ACS but many other  chronic and acute conditions are known to elevate hsTnI results.  Refer to the "Links" section for chest pain algorithms and additional  guidance. Performed at [facility], [place] [address] [place], [place] [address]   Troponin I (High Sensitivity)     Status: Abnormal   Collection Time: 06/20/22 10:10 AM  Result Value Ref Range   Troponin I (High Sensitivity) 19 (H) <18 ng/L    Comment: (NOTE) Elevated high sensitivity troponin I (hsTnI) values and significant  changes across serial measurements may suggest ACS but many other  chronic and acute conditions are known to elevate hsTnI  results.  Refer to the "Links" section for chest pain algorithms and additional  guidance. Performed at [facility], [place] [address], [place], [place] [address]   Heparin level (unfractionated)     Status: None   Collection Time: 06/20/22  4:18 PM  Result Value Ref Range   Heparin Unfractionated 0.64 0.30 - 0.70 IU/mL    Comment: (NOTE) The clinical reportable range upper limit is being lowered to >1.10 to align with the FDA approved guidance for the current laboratory assay.  If heparin results are below expected values, and patient dosage has  been confirmed, suggest follow up testing of antithrombin III levels. Performed at [facility], [place] [address] [place], [place] [address]   Troponin I (High Sensitivity)     Status: Abnormal   Collection Time: 06/20/22  8:00 PM  Result Value Ref Range   Troponin I (High Sensitivity) 21 (H) <18 ng/L    Comment: (NOTE) Elevated high sensitivity troponin I (hsTnI) values and significant  changes across serial measurements may suggest ACS but many other  chronic and acute conditions are known to elevate hsTnI results.  Refer to the "Links" section for chest pain algorithms and additional  guidance. Performed at [facility], [place] [address], [place], [place] [address]   Heparin level (unfractionated)     Status: None   Collection Time: 06/20/22  8:00 PM  Result Value Ref Range   Heparin Unfractionated 0.34 0.30 - 0.70 IU/mL    Comment: (NOTE) The clinical reportable range upper limit is being lowered to >1.10 to align with the FDA approved guidance for the current laboratory assay.  If heparin results are below expected values, and patient dosage has  been confirmed, suggest follow up testing of antithrombin III levels. Performed at [facility], [place] [address] [place], [place] [address]   CBC  Status: Abnormal   Collection Time: 06/21/22  5:45 AM  Result Value Ref Range   WBC 7.0 4.0 - 10.5 K/uL    RBC 4.52 4.22 - 5.81 MIL/uL   Hemoglobin 13.5 13.0 - 17.0 g/dL   HCT 40.5 39.0 - 52.0 %   MCV 89.6 80.0 - 100.0 fL   MCH 29.9 26.0 - 34.0 pg   MCHC 33.3 30.0 - 36.0 g/dL   RDW 15.7 (H) 11.5 - 15.5 %   Platelets 336 150 - 400 K/uL   nRBC 0.0 0.0 - 0.2 %    Comment: Performed at [facility], [place] [address] [place], [place] [address]  Basic metabolic panel     Status: Abnormal   Collection Time: 06/21/22  5:45 AM  Result Value Ref Range   Sodium 141 135 - 145 mmol/L   Potassium 3.4 (L) 3.5 - 5.1 mmol/L   Chloride 105 98 - 111 mmol/L   CO2 25 22 - 32 mmol/L   Glucose, Bld 93 70 - 99 mg/dL    Comment: Glucose reference range applies only to samples taken after fasting for at least 8 hours.   BUN 13 8 - 23 mg/dL   Creatinine, Ser 1.31 (H) 0.61 - 1.24 mg/dL   Calcium 8.7 (L) 8.9 - 10.3 mg/dL   GFR, Estimated 52 (L) >60 mL/min    Comment: (NOTE) Calculated using the CKD-EPI Creatinine Equation (2021)    Anion gap 11 5 - 15    Comment: Performed at [facility] [address] [place], [place] [address]  Heparin level (unfractionated)     Status: None   Collection Time: 06/21/22  5:45 AM  Result Value Ref Range   Heparin Unfractionated 0.37 0.30 - 0.70 IU/mL    Comment: (NOTE) The clinical reportable range upper limit is being lowered to >1.10 to align with the FDA approved guidance for the current laboratory assay.  If heparin results are below expected values, and patient dosage has  been confirmed, suggest follow up testing of antithrombin III levels. Performed at [facility], [place] [address], [place], [place] [address]    *Note: Due to a large number of results and/or encounters for the requested time period, some results have not been displayed. A complete set of results can be found in Results Review.   ECHOCARDIOGRAM COMPLETE  Result Date: 06/20/2022    ECHOCARDIOGRAM REPORT   Patient Name:   KAIZEN IBSEN Date of Exam: 06/20/2022 Medical Rec #:   [id number]       Height:       73.0 in Accession #:    [id number]      Weight:       208.0 lb Date of Birth:  01/02/1932        BSA:          2.188 m Patient Age:    52 years        BP:           189/46 mmHg Patient Gender: M               HR:           94 bpm. Exam Location:  Inpatient Procedure: 2D Echo Indications:    pulmonary embolus  History:        Patient has prior history of Echocardiogram examinations. CHF,                 CAD; Risk Factors:Hypertension and  Dyslipidemia.  Sonographer:    Johny Chess RDCS Referring Phys: [id number] [place]  1. Left ventricular ejection fraction, by estimation, is 40%. The left ventricle has mild to moderately decreased function. The left ventricle demonstrates global hypokinesis. There is mild concentric left ventricular hypertrophy. Indeterminate diastolic filling due to E-A fusion.  2. Right ventricular systolic function is normal. The right ventricular size is normal. Tricuspid regurgitation signal is inadequate for assessing PA pressure.  3. Left atrial size was mild to moderately dilated.  4. The mitral valve is normal in structure. Trivial mitral valve regurgitation. No evidence of mitral stenosis.  5. The aortic valve is tricuspid. Aortic valve regurgitation is not visualized. No aortic stenosis is present.  6. The inferior vena cava is normal in size with greater than 50% respiratory variability, suggesting right atrial pressure of 3 mmHg. FINDINGS  Left Ventricle: Left ventricular ejection fraction, by estimation, is 40%. The left ventricle has mild to moderately decreased function. The left ventricle demonstrates global hypokinesis. The left ventricular internal cavity size was normal in size. There is mild concentric left ventricular hypertrophy. Indeterminate diastolic filling due to E-A fusion. Right Ventricle: The right ventricular size is normal. No increase in right ventricular wall thickness. Right ventricular systolic function is  normal. Tricuspid regurgitation signal is inadequate for assessing PA pressure. Left Atrium: Left atrial size was mild to moderately dilated. Right Atrium: Right atrial size was normal in size. Pericardium: There is no evidence of pericardial effusion. Mitral Valve: The mitral valve is normal in structure. Mild mitral annular calcification. Trivial mitral valve regurgitation. No evidence of mitral valve stenosis. Tricuspid Valve: The tricuspid valve is normal in structure. Tricuspid valve regurgitation is not demonstrated. Aortic Valve: The aortic valve is tricuspid. Aortic valve regurgitation is not visualized. No aortic stenosis is present. Pulmonic Valve: The pulmonic valve was normal in structure. Pulmonic valve regurgitation is not visualized. Aorta: The aortic root is normal in size and structure. Venous: The inferior vena cava is normal in size with greater than 50% respiratory variability, suggesting right atrial pressure of 3 mmHg. IAS/Shunts: No atrial level shunt detected by color flow Doppler.  LEFT VENTRICLE PLAX 2D LVIDd:         5.10 cm     Diastology LVIDs:         3.80 cm     LV e' lateral: 5.91 cm/s LV PW:         1.30 cm LV IVS:        0.90 cm LVOT diam:     2.10 cm LV SV:         64 LV SV Index:   29 LVOT Area:     3.46 cm  LV Volumes (MOD) LV vol d, MOD A2C: 91.8 ml LV vol d, MOD A4C: 92.2 ml LV vol s, MOD A2C: 47.2 ml LV vol s, MOD A4C: 54.2 ml LV SV MOD A2C:     44.6 ml LV SV MOD A4C:     92.2 ml LV SV MOD BP:      41.9 ml RIGHT VENTRICLE             IVC RV S prime:     22.20 cm/s  IVC diam: 1.60 cm TAPSE (M-mode): 1.3 cm LEFT ATRIUM             Index        RIGHT ATRIUM           Index LA diam:  4.60 cm 2.10 cm/m   RA Area:     16.10 cm LA Vol (A2C):   64.2 ml 29.34 ml/m  RA Volume:   42.50 ml  19.43 ml/m LA Vol (A4C):   89.3 ml 40.82 ml/m LA Biplane Vol: 78.5 ml 35.88 ml/m  AORTIC VALVE LVOT Vmax:   93.10 cm/s LVOT Vmean:  60.900 cm/s LVOT VTI:    0.185 m  AORTA Ao Root diam:  3.40 cm Ao Asc diam:  3.30 cm  SHUNTS Systemic VTI:  0.18 m Systemic Diam: 2.10 cm Dalton McleanMD Electronically signed by Franki Monte Signature Date/Time: 06/20/2022/5:57:55 PM    Final    VAS [place] LOWER EXTREMITY VENOUS (DVT) (7a-7p)  Result Date: 06/20/2022  Lower Venous DVT Study Patient Name:  KAMRAN COKER  Date of Exam:   06/20/2022 Medical Rec #: [id number]        Accession #:    [id number] Date of Birth: 14-Jul-1932         Patient Gender: M Patient Age:   30 years Exam Location:  [facility] Procedure:      VAS [place] LOWER EXTREMITY VENOUS (DVT) Referring Phys: JULIE HAVILAND --------------------------------------------------------------------------------  Indications: Pulmonary embolism.  Limitations: Skin changes bilateral calves, tenderness to palpation. Comparison Study: 08-08-2020 Prior bilateral lower extremity venous study was                   negative for DVT. Performing Technologist: Darlin Coco RDMS, RVT  Examination Guidelines: A complete evaluation includes B-mode imaging, spectral Doppler, color Doppler, and power Doppler as needed of all accessible portions of each vessel. Bilateral testing is considered an integral part of a complete examination. Limited examinations for reoccurring indications may be performed as noted. The reflux portion of the exam is performed with the patient in reverse Trendelenburg.  +---------+---------------+---------+-----------+----------+-------------------+ RIGHT    CompressibilityPhasicitySpontaneityPropertiesThrombus Aging      +---------+---------------+---------+-----------+----------+-------------------+ CFV      Full           Yes      Yes                                      +---------+---------------+---------+-----------+----------+-------------------+ SFJ      Full                                                             +---------+---------------+---------+-----------+----------+-------------------+ FV Prox  Full                                                              +---------+---------------+---------+-----------+----------+-------------------+ FV Mid   Full                                                             +---------+---------------+---------+-----------+----------+-------------------+ FV DistalFull                                                             +---------+---------------+---------+-----------+----------+-------------------+  PFV      Full                                                             +---------+---------------+---------+-----------+----------+-------------------+ POP      Full           Yes      Yes                                      +---------+---------------+---------+-----------+----------+-------------------+ PTV      Full                                         Not well visualized                                                       secondary to skin                                                         changes             +---------+---------------+---------+-----------+----------+-------------------+ PERO     Full                                         Not well visualized                                                       secondary to skin                                                         changes             +---------+---------------+---------+-----------+----------+-------------------+ Gastroc  Full                                                             +---------+---------------+---------+-----------+----------+-------------------+   +---------+---------------+---------+-----------+----------+--------------+ LEFT     CompressibilityPhasicitySpontaneityPropertiesThrombus Aging +---------+---------------+---------+-----------+----------+--------------+ CFV      Full           Yes      Yes                                  +---------+---------------+---------+-----------+----------+--------------+  SFJ      Full                                                        +---------+---------------+---------+-----------+----------+--------------+ FV Prox  Full                                                        +---------+---------------+---------+-----------+----------+--------------+ FV Mid   Full                                                        +---------+---------------+---------+-----------+----------+--------------+ FV DistalFull                                                        +---------+---------------+---------+-----------+----------+--------------+ PFV      Full                                                        +---------+---------------+---------+-----------+----------+--------------+ POP      Full           Yes      Yes                                 +---------+---------------+---------+-----------+----------+--------------+ PTV      Full                                                        +---------+---------------+---------+-----------+----------+--------------+ PERO     Full                                                        +---------+---------------+---------+-----------+----------+--------------+ Gastroc  Full                                                        +---------+---------------+---------+-----------+----------+--------------+     Summary: RIGHT: - There is no evidence of deep vein thrombosis in the lower extremity. However, portions of this examination were limited- see technologist comments above.  - A cystic structure is visualized within the muscle fascia of the proximal calf.  LEFT: - There is no evidence of deep  vein thrombosis in the lower extremity.  - No cystic structure found in the popliteal fossa.  *See table(s) above for measurements and observations. Electronically signed by Jamelle Haring on 06/20/2022 at  3:54:24 PM.    Final    CT Angio Chest PE W and/or Wo Contrast  Result Date: 06/20/2022 CLINICAL DATA:  PE Suspected EXAM: CT ANGIOGRAPHY CHEST WITH CONTRAST TECHNIQUE: Multidetector CT imaging of the chest was performed using the standard protocol during bolus administration of intravenous contrast. Multiplanar CT image reconstructions and MIPs were obtained to evaluate the vascular anatomy. RADIATION DOSE REDUCTION: This exam was performed according to the departmental dose-optimization program which includes automated exposure control, adjustment of the mA and/or kV according to patient size and/or use of iterative reconstruction technique. CONTRAST:  22m OMNIPAQUE IOHEXOL 350 MG/ML SOLN COMPARISON:  CT chest 08/10/2018, MRI thoracic spine 09/26/2021 FINDINGS: Cardiovascular: Satisfactory opacification of the pulmonary arteries to the segmental level. There is evidence of PE and right upper lobe lobar and segmental pulmonary arteries (series 6, image 117). There is no evidence of right heart strain. No pericardial effusion. Moderate coronary artery calcifications. Mediastinum/Nodes: No enlarged mediastinal, hilar, or axillary lymph nodes. Thyroid gland, trachea, and esophagus demonstrate no significant findings. Lungs/Pleura: No focal consolidation or large pulmonary nodule visualized. Assessment for small pulmonary nodules is slightly limited due to respiratory motion artifact. No pleural effusion or pneumothorax. Upper Abdomen: Patient is status post cholecystectomy. There is evidence of hepatic steatosis. There are vascular calcifications of the abdominal aorta and the splenic artery. Musculoskeletal: There are compression deformities in the lower thoracic spine at the T11-T12 vertebral body levels. There are post kyphoplasty changes at the L1 and L2 levels. These are unchanged compared to MRI thoracic spine 09/26/2021. There is a ground-glass lesion of the left scapula, which likely represents a healing  fracture. Review of the MIP images confirms the above findings. IMPRESSION: Acute pulmonary embolism in the right upper lobe lobar and segmental pulmonary arteries. No evidence of right heart strain. Aortic Atherosclerosis (ICD10-I70.0). Electronically Signed   By: HMarin RobertsM.D.   On: 06/20/2022 11:27   DG Chest 2 View  Result Date: 06/19/2022 CLINICAL DATA:  Chest pain. EXAM: CHEST - 2 VIEW COMPARISON:  Chest x-ray June 4, 22. FINDINGS: The heart size and mediastinal contours are within normal limits. Both lungs are clear. No visible pleural effusions or pneumothorax. No acute osseous abnormality. Polyarticular degenerative change. Cervical spinal fusion hardware. IMPRESSION: No active cardiopulmonary disease. Electronically Signed   By: FMargaretha SheffieldM.D.   On: 06/19/2022 13:16    Review Of Systems Constitutional: No fever, chills, weight loss or gain. Eyes: No vision change, wears glasses. No discharge or pain. Ears: No hearing loss, No tinnitus. Respiratory: No asthma, COPD, pneumonias. Positive shortness of breath. No hemoptysis. Cardiovascular: Positive chest pain, palpitation, leg edema. Gastrointestinal: No nausea, vomiting, diarrhea, constipation. No GI bleed. No hepatitis. Genitourinary: No dysuria, hematuria, kidney stone. No incontinance. Neurological: No headache, stroke, seizures.  Psychiatry: No psych facility admission for anxiety, depression, suicide. No detox. Skin: No rash. Musculoskeletal: Positive joint pain, neck pain, back pain. Lymphadenopathy: No lymphadenopathy. Hematology: No anemia or easy bruising.   Blood pressure (!) 175/102, pulse (!) 109, temperature 98.1 F (36.7 C), temperature source Oral, resp. rate 17, height '6\' 1"'$  (1.854 m), weight 94.3 kg, SpO2 94 %. Body mass index is 27.44 kg/m. General appearance: alert, cooperative, appears stated age and mild distress Head: Normocephalic, atraumatic. Eyes: Blue eyes, pink conjunctiva, corneas clear.  Neck: No adenopathy, no carotid bruit, no JVD, supple, symmetrical, trachea midline and thyroid not enlarged. Resp: Clear to auscultation bilaterally. Cardio: Regular rate and rhythm, S1, S2 normal, III/VI systolic murmur, no click, rub or gallop GI: Soft, non-tender; bowel sounds normal; no organomegaly. Extremities: Trace edema, no cyanosis or clubbing. Lower leg skin changes of venous stasis. Skin: Warm and dry.  Neurologic: Alert and oriented X 3, normal strength. Normal coordination.  Assessment/Plan Acute coronary syndrome Acute PE CAD Chronic systolic left heart failure, HFrEF HTN HLD Chronic low back pain CKD, II  Plan: Agree with IV heparin. Consider cardiac catheterization. Add Aspirin, small dose B-blocker and losartan.  Time spent: Review of old records, Lab, x-rays, EKG, other cardiac tests, examination, discussion with patient/Doctor/Nurse over 70 minutes.  Birdie Riddle, MD  06/21/2022, 9:11 AM

## 2022-06-21 NOTE — Progress Notes (Signed)
[facility] PROGRESS NOTE  DONYALE BERTHOLD (DOB: April 18, 1932) YOV:[id number] PCP: Hoyt Koch, MD  Brief Narrative: [name] is an 88 y.o. male with a history of CAD, BPH, chronic HFpEF, HTN, HLD, chronic opioid-dependent back pain s/p back surgery complication who presented to the ED on 06/19/2022 with intermittent chest pain/pressure with worsening exertional dyspnea. He had presented initially to PCP and was severely hypertensive with ST segment changes, referred to ED where troponin was 19 > 25, ECG without ST elevations, but did show S1Q3T3. CTA chest revealed right upper lobar and segmental pulmonary emboli without RV strain. IB heparin was started. Echocardiogram reveals normal RV size and function, but newly discovered LV systolic dysfunction with LVEF 40% for which cardiology is consulted. GDMT initiated, heparin continued, and plan for cardiac catheterization on 11/10.   Subjective: Denies current chest pain or pressure, at times has had palpitations. Rightfully frustrated with very long wait in ED and uncomfortable stretcher, but otherwise in good spirits. 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 No orthopnea, PND. No bleeding since starting heparin. Denies any unintentional weight loss, fatigue, malaise, GI bleeding, LUTS.  Objective: BP (!) 195/104   Pulse 95   Temp 98.1 F (36.7 C) (Oral)   Resp 17   Ht '6\' 1"'$  (1.854 m)   Wt 94.3 kg   SpO2 97%   BMI 27.44 kg/m   Gen: Elderly, pleasant, interactive male Pulm: Clear, nonlabored  CV: RRR without MRG.  GI: Soft, NT, ND, +BS  Neuro: Alert and oriented. No new focal deficits. Ext: Warm, no deformities. Pitting dependent leg edema with overlying hemosiderosis.  Skin: No other/acute rashes, lesions or ulcers on visualized skin   Assessment & Plan: Principal Problem:   Acute pulmonary embolism (HCC) Active Problems:   Hyperlipidemia   Essential hypertension   CAD (coronary artery disease)   Chronic  radicular low back pain   Benign prostatic hyperplasia   Chronic diastolic CHF (congestive heart failure) (HCC)   Hypokalemia  Acute pulmonary embolism. RUL lobar and segmental branches involved without evidence of RV strain radiographically, on echo, or on exam.  - Seemingly unprovoked. Pt reports no constitutional symptoms of malignancy.  - Continue IV heparin per cardiology (was planning conversion to eliquis today). Will monitor for bleeding. - LE U/S pending.  Acutely diagnosed, but compensated HFrEF on chronic HFpEF: LVEF 40% with global hypokinesis. Pt has reported history of alcohol consumption, quantity unclear, though this could be dilated cardiomyopathy. ICM still in differential as well.  - Cardiology discussed with patient who consents to inpatient initiation of GDMT as tolerated and ischemic evaluation.  - Start metoprolol, plan to transition to succinate prior to discharge. - Start losartan, monitor CrCl.  - Continue lasix. Appears euvolemic centrally, though does have chronic LE edema (multifactorial).  - Monitor I/O, weights.   History of CAD, demand myocardial ischemia: Suspect etiology of mild troponin elevation is demand due to PE. Global hypokinesis without regional wall motion abnormalities on echo. Pt's chest pain is resolved, not consistent with ACS, though ischemic cardiomyopathy remains on differential.  - ASA, BB, statin - Cardiology considering cardiac catheterization as above.   Chronic back pain, spinal stenosis, neurogenic claudication, peripheral neuropathy, T11 compression fracture: Follows with pain medicine specialist ([place]).  - On stable dose oxycodone which we've reordered. 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 Sherwood Gambler.  HLD:  - Check LDL, consider starting statin as above  Hypokalemia: Supplemented   BPH: Voiding regularly - Continue tamsulosin  DNR: POA.  Patrecia Pour, MD [facility] [email or web address] 06/21/2022, 2:17  PM

## 2022-06-21 NOTE — TOC Benefit Eligibility Note (Signed)
Patient Teacher, English as a foreign language completed.    The patient is currently admitted and upon discharge could be taking Eliquis Starter Pack.  The current 30 day co-pay is $200.83.   The patient is insured through [place], [place] Patient [place] Patient Advocate Team Direct Number: [phone]  Fax: [phone]

## 2022-06-21 NOTE — Progress Notes (Deleted)
ANTICOAGULATION CONSULT NOTE - Follow Up Consult  Pharmacy Consult for Eliquis Indication: pulmonary embolus  Allergies  Allergen Reactions   Amlodipine Swelling and Other (See Comments)    Ankle swelling   Lyrica [Pregabalin] Swelling and Other (See Comments)    Hands and feet swell   Gabapentin Other (See Comments)    Lower extremity edema- knees and feet    Patient Measurements: Height: '6\' 1"'$  (185.4 cm) Weight: 94.3 kg (208 lb) IBW/kg (Calculated) : 79.9 Heparin Dosing Weight: 94kg  Vital Signs: Temp: 98.1 F (36.7 C) (11/09 0933) Temp Source: Oral (11/09 0933) BP: 175/102 (11/09 0859) Pulse Rate: 109 (11/09 0859)  Labs: Recent Labs    06/19/22 1233 06/19/22 1620 06/20/22 1010 06/20/22 1618 06/20/22 2000 06/21/22 0545  HGB 14.5  --   --   --   --  13.5  HCT 43.9  --   --   --   --  40.5  PLT 400  --   --   --   --  336  HEPARINUNFRC  --   --   --  0.64 0.34 0.37  CREATININE 1.07  --   --   --   --  1.31*  TROPONINIHS 19* 25* 19*  --  21*  --      Estimated Creatinine Clearance: 44.1 mL/min (A) (by C-G formula based on SCr of 1.31 mg/dL (H)).   Medications:  Scheduled:   apixaban  10 mg Oral BID   Followed by   Derrill Memo ON 06/28/2022] apixaban  5 mg Oral BID   aspirin EC  81 mg Oral Daily   furosemide  20 mg Oral Daily   losartan  25 mg Oral Daily   metoprolol tartrate  25 mg Oral BID   oxyCODONE  10 mg Oral Q1200   oxyCODONE  20 mg Oral BID   potassium chloride  10 mEq Oral BID   sodium chloride flush  3 mL Intravenous Q12H   tamsulosin  0.4 mg Oral Daily   Infusions:   heparin 1,650 Units/hr (06/21/22 0548)    Assessment: 42 YOM presenting with chest pain found to have an acute PE on imaging. No anticoagulation prior to admission. Pharmacy consulted to transition from heparin to Eliquis.  Heparin level 0.37 units/mL on heparin 1650 units/mL which is therapeutic. No signs of bleeding reported. CBC is stable.  Goal of Therapy:  Heparin level  0.3-0.7 units/ml Monitor platelets by anticoagulation protocol: Yes   Plan:  Stop heparin infusion Start Eliquis '10mg'$  PO BID x 7 days followed by '5mg'$  PO BID Continue to monitor for signs of bleeding  Erskine Speed, PharmD Clinical Pharmacist ED Pharmacist Phone # [phone] 06/21/2022 9:33 AM

## 2022-06-21 NOTE — Plan of Care (Signed)

## 2022-06-21 NOTE — Progress Notes (Signed)
Patient arrived to 4E from ED. BP 203/104 labetalol given. Tele placed and CCMD notified. Patient oriented to unit and staff. Call bell within reach.  [name]

## 2022-06-21 NOTE — ED Notes (Signed)
Patient requested RN to lower bedrail so he could urinate. Patient alert and oriented and got up with no assistance.

## 2022-06-21 NOTE — Discharge Instructions (Addendum)
Information on my medicine - ELIQUIS (apixaban)  Why was Eliquis prescribed for you? Eliquis was prescribed to treat blood clots that may have been found in the veins of your legs (deep vein thrombosis) or in your lungs (pulmonary embolism) and to reduce the risk of them occurring again.  What do You need to know about Eliquis ? The starting dose is 10 mg (two 5 mg tablets) taken TWICE daily for the FIRST SEVEN (7) DAYS, then on 06/29/2022 the dose is reduced to ONE 5 mg tablet taken TWICE daily.  Eliquis may be taken with or without food.   Try to take the dose about the same time in the morning and in the evening. If you have difficulty swallowing the tablet whole please discuss with your pharmacist how to take the medication safely.  Take Eliquis exactly as prescribed and DO NOT stop taking Eliquis without talking to the doctor who prescribed the medication.  Stopping may increase your risk of developing a new blood clot.  Refill your prescription before you run out.  After discharge, you should have regular check-up appointments with your healthcare provider that is prescribing your Eliquis.    What do you do if you miss a dose? If a dose of ELIQUIS is not taken at the scheduled time, take it as soon as possible on the same day and twice-daily administration should be resumed. The dose should not be doubled to make up for a missed dose.  Important Safety Information A possible side effect of Eliquis is bleeding. You should call your healthcare provider right away if you experience any of the following: Bleeding from an injury or your nose that does not stop. Unusual colored urine (red or dark brown) or unusual colored stools (red or black). Unusual bruising for unknown reasons. A serious fall or if you hit your head (even if there is no bleeding).  Some medicines may interact with Eliquis and might increase your risk of bleeding or clotting while on Eliquis. To help avoid  this, consult your healthcare provider or pharmacist prior to using any new prescription or non-prescription medications, including herbals, vitamins, non-steroidal anti-inflammatory drugs (NSAIDs) and supplements.  This website has more information on Eliquis (apixaban): http://[email or web address].com/eliquis/home

## 2022-06-21 NOTE — Progress Notes (Signed)
Paged MD [name] to inform about blood pressure. Stated he will take a look at patient's medications. Will continue to monitor.  [name]

## 2022-06-22 ENCOUNTER — Other Ambulatory Visit (HOSPITAL_COMMUNITY): Payer: Self-pay

## 2022-06-22 DIAGNOSIS — E785 Hyperlipidemia, unspecified: Secondary | ICD-10-CM

## 2022-06-22 DIAGNOSIS — Z981 Arthrodesis status: Secondary | ICD-10-CM | POA: Diagnosis not present

## 2022-06-22 DIAGNOSIS — G8929 Other chronic pain: Secondary | ICD-10-CM | POA: Diagnosis present

## 2022-06-22 DIAGNOSIS — N400 Enlarged prostate without lower urinary tract symptoms: Secondary | ICD-10-CM | POA: Diagnosis present

## 2022-06-22 DIAGNOSIS — E876 Hypokalemia: Secondary | ICD-10-CM | POA: Diagnosis present

## 2022-06-22 DIAGNOSIS — I130 Hypertensive heart and chronic kidney disease with heart failure and stage 1 through stage 4 chronic kidney disease, or unspecified chronic kidney disease: Secondary | ICD-10-CM | POA: Diagnosis present

## 2022-06-22 DIAGNOSIS — Z79899 Other long term (current) drug therapy: Secondary | ICD-10-CM | POA: Diagnosis not present

## 2022-06-22 DIAGNOSIS — I5032 Chronic diastolic (congestive) heart failure: Secondary | ICD-10-CM | POA: Diagnosis present

## 2022-06-22 DIAGNOSIS — M545 Low back pain, unspecified: Secondary | ICD-10-CM | POA: Diagnosis present

## 2022-06-22 DIAGNOSIS — N182 Chronic kidney disease, stage 2 (mild): Secondary | ICD-10-CM | POA: Diagnosis present

## 2022-06-22 DIAGNOSIS — I2489 Other forms of acute ischemic heart disease: Secondary | ICD-10-CM | POA: Diagnosis present

## 2022-06-22 DIAGNOSIS — Z8249 Family history of ischemic heart disease and other diseases of the circulatory system: Secondary | ICD-10-CM | POA: Diagnosis not present

## 2022-06-22 DIAGNOSIS — I2510 Atherosclerotic heart disease of native coronary artery without angina pectoris: Secondary | ICD-10-CM | POA: Diagnosis present

## 2022-06-22 DIAGNOSIS — M109 Gout, unspecified: Secondary | ICD-10-CM | POA: Diagnosis present

## 2022-06-22 DIAGNOSIS — Z85828 Personal history of other malignant neoplasm of skin: Secondary | ICD-10-CM | POA: Diagnosis not present

## 2022-06-22 DIAGNOSIS — I2699 Other pulmonary embolism without acute cor pulmonale: Secondary | ICD-10-CM | POA: Diagnosis present

## 2022-06-22 DIAGNOSIS — Z96652 Presence of left artificial knee joint: Secondary | ICD-10-CM | POA: Diagnosis present

## 2022-06-22 DIAGNOSIS — Z888 Allergy status to other drugs, medicaments and biological substances status: Secondary | ICD-10-CM | POA: Diagnosis not present

## 2022-06-22 DIAGNOSIS — Z87891 Personal history of nicotine dependence: Secondary | ICD-10-CM | POA: Diagnosis not present

## 2022-06-22 DIAGNOSIS — Z66 Do not resuscitate: Secondary | ICD-10-CM | POA: Diagnosis present

## 2022-06-22 DIAGNOSIS — G629 Polyneuropathy, unspecified: Secondary | ICD-10-CM | POA: Diagnosis present

## 2022-06-22 DIAGNOSIS — Z85820 Personal history of malignant melanoma of skin: Secondary | ICD-10-CM | POA: Diagnosis not present

## 2022-06-22 DIAGNOSIS — M4854XA Collapsed vertebra, not elsewhere classified, thoracic region, initial encounter for fracture: Secondary | ICD-10-CM | POA: Diagnosis present

## 2022-06-22 LAB — CBC
HCT: 39.7 % (ref 39.0–52.0)
Hemoglobin: 13 g/dL (ref 13.0–17.0)
MCH: 29.6 pg (ref 26.0–34.0)
MCHC: 32.7 g/dL (ref 30.0–36.0)
MCV: 90.4 fL (ref 80.0–100.0)
Platelets: 363 10*3/uL (ref 150–400)
RBC: 4.39 MIL/uL (ref 4.22–5.81)
RDW: 15.9 % — ABNORMAL HIGH (ref 11.5–15.5)
WBC: 7.7 10*3/uL (ref 4.0–10.5)
nRBC: 0 % (ref 0.0–0.2)

## 2022-06-22 LAB — HEPARIN LEVEL (UNFRACTIONATED): Heparin Unfractionated: 0.39 IU/mL (ref 0.30–0.70)

## 2022-06-22 LAB — BASIC METABOLIC PANEL
Anion gap: 7 (ref 5–15)
BUN: 19 mg/dL (ref 8–23)
CO2: 24 mmol/L (ref 22–32)
Calcium: 8.4 mg/dL — ABNORMAL LOW (ref 8.9–10.3)
Chloride: 107 mmol/L (ref 98–111)
Creatinine, Ser: 1.27 mg/dL — ABNORMAL HIGH (ref 0.61–1.24)
GFR, Estimated: 54 mL/min — ABNORMAL LOW (ref >60)
Glucose, Bld: 93 mg/dL (ref 70–99)
Potassium: 3.8 mmol/L (ref 3.5–5.1)
Sodium: 138 mmol/L (ref 135–145)

## 2022-06-22 MED ORDER — APIXABAN (ELIQUIS) EDUCATION KIT FOR DVT/PE PATIENTS
PACK | Freq: Once | Status: AC
  Filled 2022-06-22: qty 1

## 2022-06-22 MED ORDER — LOSARTAN POTASSIUM 50 MG PO TABS
50.0000 mg | ORAL_TABLET | Freq: Every day | ORAL | 0 refills | Status: DC
  Filled 2022-06-22: qty 30, 30d supply, fill #0

## 2022-06-22 MED ORDER — ASPIRIN 81 MG PO TBEC
81.0000 mg | DELAYED_RELEASE_TABLET | Freq: Every day | ORAL | 0 refills | Status: DC
  Filled 2022-06-22: qty 30, 30d supply, fill #0

## 2022-06-22 MED ORDER — METOPROLOL SUCCINATE ER 50 MG PO TB24
50.0000 mg | ORAL_TABLET | Freq: Every day | ORAL | 0 refills | Status: DC
  Filled 2022-06-22: qty 30, 30d supply, fill #0

## 2022-06-22 MED ORDER — FUROSEMIDE 20 MG PO TABS
20.0000 mg | ORAL_TABLET | Freq: Every day | ORAL | 0 refills | Status: DC
  Filled 2022-06-22: qty 30, 30d supply, fill #0

## 2022-06-22 MED ORDER — APIXABAN 5 MG PO TABS: 5.0000 mg | ORAL_TABLET | Freq: Two times a day (BID) | ORAL | Status: DC

## 2022-06-22 MED ORDER — APIXABAN (ELIQUIS) VTE STARTER PACK (10MG AND 5MG)
ORAL_TABLET | ORAL | 0 refills | Status: DC
  Filled 2022-06-22: qty 74, 30d supply, fill #0

## 2022-06-22 MED ORDER — APIXABAN 5 MG PO TABS
10.0000 mg | ORAL_TABLET | Freq: Two times a day (BID) | ORAL | Status: DC
  Administered 2022-06-22: 10 mg via ORAL
  Filled 2022-06-22: qty 2

## 2022-06-22 NOTE — Plan of Care (Signed)

## 2022-06-22 NOTE — Progress Notes (Signed)
ANTICOAGULATION CONSULT NOTE - Follow Up Consult  Pharmacy Consult for Heparin Indication: pulmonary embolus  Allergies  Allergen Reactions   Amlodipine Swelling and Other (See Comments)    Ankle swelling   Lyrica [Pregabalin] Swelling and Other (See Comments)    Hands and feet swell   Gabapentin Other (See Comments)    Lower extremity edema- knees and feet    Patient Measurements: Height: '6\' 1"'$  (185.4 cm) Weight: 86.2 kg (190 lb 0.6 oz) IBW/kg (Calculated) : 79.9 Heparin Dosing Weight: 94kg  Vital Signs: Temp: 97.8 F (36.6 C) (11/10 0409) Temp Source: Oral (11/10 0409) BP: 152/78 (11/10 0409) Pulse Rate: 93 (11/10 0409)  Labs: Recent Labs    06/19/22 1233 06/19/22 1620 06/20/22 1010 06/20/22 1618 06/20/22 2000 06/21/22 0545 06/22/22 0108  HGB 14.5  --   --   --   --  13.5 13.0  HCT 43.9  --   --   --   --  40.5 39.7  PLT 400  --   --   --   --  336 363  HEPARINUNFRC  --   --   --    < > 0.34 0.37 0.39  CREATININE 1.07  --   --   --   --  1.31* 1.27*  TROPONINIHS 19* 25* 19*  --  21*  --   --    < > = values in this interval not displayed.     Estimated Creatinine Clearance: 45.4 mL/min (A) (by C-G formula based on SCr of 1.27 mg/dL (H)).   Medications:  Scheduled:   aspirin EC  81 mg Oral Daily   furosemide  20 mg Oral Daily   hydrALAZINE  25 mg Oral Q8H   losartan  25 mg Oral Daily   metoprolol tartrate  50 mg Oral BID   Naldemedine Tosylate  0.2 mg Oral Daily   oxyCODONE  20 mg Oral BID   potassium chloride  10 mEq Oral BID   sodium chloride flush  3 mL Intravenous Q12H   tamsulosin  0.4 mg Oral Daily   Infusions:   heparin 1,650 Units/hr (06/22/22 0606)    Assessment: 47 YOM presenting with chest pain found to have an acute PE on imaging. No anticoagulation prior to admission. Pharmacy consulted to change heparin to apixaban.   Goal of Therapy:  Monitor platelets by anticoagulation protocol: Yes   Plan:  Stop heparin when first dose of  apixaban given  Start apixaban '10mg'$  twice daily x 7 days then apixaban '5mg'$  twice daily Pharmacy to provide education and copay cards (30 days free and 10 day free)   Cristela Felt, PharmD, BCPS Clinical Pharmacist 06/22/2022 8:16 AM

## 2022-06-22 NOTE — Evaluation (Signed)
Physical Therapy Evaluation & Discharge Patient Details Name: [name] MRN: [id number] DOB: 21-Aug-1931 Today's Date: 06/22/2022  History of Present Illness  Pt is an 88 y.o. male who presented 06/19/22 with chest pain. Pt found to have an acute pulmonary embolism in the right upper lobe lobar and segmental pulmonary arteries. PMH: arthritis, CAD, bil carpal tunnel syndrome, CHF, chronic opioid-dependent back pain s/p back surgery complication, DVT, gout, melanoma, HLD, HTN, patellar tendinitis   Clinical Impression  Pt presents with condition above. PTA, he was mod I using a RW the majority of the time for functional mobility. Pt lives with his wife in a 1-level house with an elevator entrance option. Pt reports a hx of lower extremity neuropathy and weakness impacting his balance. He does report feeling a little weaker compared to normal secondary to being in the hospital for a couple days, but does not appear to be functioning far from his baseline. He is able to mobilize with good safety awareness without LOB while using his RW at this time. Pt reported he does not desire to follow-up with HH or OPPT to address his deficits in balance and strength at this time, but reports understanding to consult his MD for a referral if he changes his mind and continues to have difficulties. As pt appears to be functioning close to his baseline with good safety awareness, PT will sign off at this time. All education completed and questions answered.     Recommendations for follow up therapy are one component of a multi-disciplinary discharge planning process, led by the attending physician.  Recommendations may be updated based on patient status, additional functional criteria and insurance authorization.  Follow Up Recommendations No PT follow up (pt declined OP/HHPT, informed pt to consult MD if he changes his mind)      Assistance Recommended at Discharge PRN  Patient can return home with the  following       Equipment Recommendations None recommended by PT  Recommendations for Other Services       Functional Status Assessment Patient has not had a recent decline in their functional status     Precautions / Restrictions Precautions Precautions: Fall Restrictions Weight Bearing Restrictions: No      Mobility  Bed Mobility Overal bed mobility: Independent             General bed mobility comments: Pt able to perform all bed mobility aspects without assistance    Transfers Overall transfer level: Needs assistance Equipment used: None Transfers: Sit to/from Stand Sit to Stand: Supervision           General transfer comment: Supervision for safety, no LOB    Ambulation/Gait Ambulation/Gait assistance: Supervision Gait Distance (Feet): 220 Feet Assistive device: Rolling walker (2 wheels), None Gait Pattern/deviations: Step-through pattern, Decreased stride length, Shuffle, Decreased dorsiflexion - left, Decreased dorsiflexion - right Gait velocity: reduced Gait velocity interpretation: <1.8 ft/sec, indicate of risk for recurrent falls   General Gait Details: Pt with slow, small, shuffling steps with poor bil feet clearance noted. No LOB, even when taking a few steps without UE support. Supervision for safety  Stairs            Wheelchair Mobility    Modified Rankin (Stroke Patients Only)       Balance Overall balance assessment: Mild deficits observed, not formally tested  Pertinent Vitals/Pain Pain Assessment Pain Assessment: Faces Faces Pain Scale: No hurt Pain Intervention(s): Monitored during session    Home Living Family/patient expects to be discharged to:: Private residence Living Arrangements: Spouse/significant other Available Help at Discharge: Family;Available 24 hours/day (wife limited in available assistance she can provide) Type of Home: House Home Access:  Elevator;Stairs to enter Entrance Stairs-Rails: Left (ascending) Entrance Stairs-Number of Steps: 13   Home Layout: One level Home Equipment: Conservation officer, nature (2 wheels);BSC/3in1;Shower seat - built in;Grab bars - tub/shower;Wheelchair - manual      Prior Function Prior Level of Function : Independent/Modified Independent;Driving             Mobility Comments: Uses RW majority of time, but can walk short distances without UE support ADLs Comments: Does household chores and drives     Hand Dominance        Extremity/Trunk Assessment   Upper Extremity Assessment Upper Extremity Assessment: Overall WFL for tasks assessed    Lower Extremity Assessment Lower Extremity Assessment: LLE deficits/detail;RLE deficits/detail RLE Deficits / Details: edema noted distally; slight numbness chronically; MMT scores of 4+ hip flexion, 5 knee extension, 4 ankle dorsiflexion RLE Sensation: decreased light touch LLE Deficits / Details: slight numbness chronically; MMT scores of 4+ hip flexion, 4 knee extension (suprapatellar location noted with pt reporting hx of patellar tendonitis), 4+ ankle dorsiflexion LLE Sensation: decreased light touch    Cervical / Trunk Assessment Cervical / Trunk Assessment: Kyphotic  Communication   Communication: No difficulties  Cognition Arousal/Alertness: Awake/alert Behavior During Therapy: WFL for tasks assessed/performed Overall Cognitive Status: Within Functional Limits for tasks assessed                                          General Comments      Exercises     Assessment/Plan    PT Assessment Patient does not need any further PT services  PT Problem List Decreased strength;Decreased activity tolerance;Decreased balance;Decreased mobility;Impaired sensation       PT Treatment Interventions Gait training;DME instruction;Stair training;Functional mobility training;Therapeutic activities;Balance training;Therapeutic  exercise;Neuromuscular re-education;Patient/family education    PT Goals (Current goals can be found in the Care Plan section)  Acute Rehab PT Goals Patient Stated Goal: to go home PT Goal Formulation: All assessment and education complete, DC therapy Time For Goal Achievement: 06/23/22 Potential to Achieve Goals: Good    Frequency Min 2X/week     Co-evaluation               AM-PAC PT "6 Clicks" Mobility  Outcome Measure Help needed turning from your back to your side while in a flat bed without using bedrails?: None Help needed moving from lying on your back to sitting on the side of a flat bed without using bedrails?: None Help needed moving to and from a bed to a chair (including a wheelchair)?: A Little Help needed standing up from a chair using your arms (e.g., wheelchair or bedside chair)?: A Little Help needed to walk in hospital room?: A Little Help needed climbing 3-5 steps with a railing? : A Little 6 Click Score: 20    End of Session   Activity Tolerance: Patient tolerated treatment well Patient left: in bed;with call bell/phone within reach Nurse Communication: Other (comment) (pt requesting food) PT Visit Diagnosis: Unsteadiness on feet (R26.81);Other abnormalities of gait and mobility (R26.89);Muscle weakness (generalized) (M62.81);Difficulty in walking, not elsewhere  classified (R26.2)    Time: 0830-0909 PT Time Calculation (min) (ACUTE ONLY): 39 min   Charges:   PT Evaluation $PT Eval Low Complexity: 1 Low PT Treatments $Gait Training: 8-22 mins $Therapeutic Activity: 8-22 mins        Moishe Spice, PT, DPT Acute Rehabilitation Services  Office: [phone]   Orvan Falconer 06/22/2022, 9:19 AM

## 2022-06-22 NOTE — Discharge Summary (Signed)
Physician Discharge Summary   Patient: [name] MRN: [id number] DOB: July 07, 1932  Admit date:     06/19/2022  Discharge date: 06/22/22  Discharge Physician: Patrecia Pour   PCP: Hoyt Koch, MD   Recommendations at discharge:  Follow up with PCP in the next week for repeat BMP and CBC and BP check. Note antihypertensive regimen augmented with HTN urgency while admitted requiring several doses of IV labetalol. Started metoprolol, losartan, restarted lasix.  Follow up with cardiology, Dr. [name], as will be arranged per his office.  Discharge Diagnoses: Principal Problem:   Acute pulmonary embolism (HCC) Active Problems:   Hyperlipidemia   Essential hypertension   CAD (coronary artery disease)   Chronic radicular low back pain   Benign prostatic hyperplasia   Chronic diastolic CHF (congestive heart failure) (HCC)   Hypokalemia  Hospital Course: [name] is an 88 y.o. male with a history of CAD, BPH, chronic HFpEF, HTN, HLD, chronic opioid-dependent back pain s/p back surgery complication who presented to the ED on 06/19/2022 with intermittent chest pain/pressure with worsening exertional dyspnea. He had presented initially to PCP and was severely hypertensive with ST segment changes, referred to ED where troponin was 19 > 25, ECG without ST elevations, but did show S1Q3T3. CTA chest revealed right upper lobar and segmental pulmonary emboli without RV strain. IB heparin was started. Echocardiogram reveals normal RV size and function, but newly discovered LV systolic dysfunction with LVEF 40% for which cardiology is consulted. GDMT initiated, heparin continued, and plan for cardiac catheterization on 11/10. For severe range HTN, IV labetalol was needed several times and antihypertensives were added/augmented. Ultimately the patient requested discharge in lieu of inpatient work up, plans to follow up with PCP and cardiology after discharge.  Assessment and Plan: Acute  pulmonary embolism. RUL lobar and segmental branches involved without evidence of RV strain radiographically, on echo, or on exam.  - Seemingly unprovoked. Pt reports no constitutional symptoms of malignancy.  - Convert IV heparin to eliquis, prescription given.    Acutely diagnosed, but compensated HFrEF on chronic HFpEF: LVEF 40% with global hypokinesis. Pt has reported history of alcohol consumption, quantity unclear, though this could be dilated cardiomyopathy. ICM still in differential as well.  - Cardiology discussed with patient who now desires to defer ischemic evaluation to outpatient setting. Will follow up with Dr. [name] for ongoing care.  - Started metoprolol, dose increased and consolidated to succinate at discharge   - Started losartan, monitor Cr, K at follow up. - Continue lasix '20mg'$  daily. Appears euvolemic centrally, though does have chronic LE edema (multifactorial).  - Monitor daily home weights.    History of CAD, demand myocardial ischemia: Suspect etiology of mild troponin elevation is demand due to PE. Global hypokinesis without regional wall motion abnormalities on echo. Pt's chest pain is resolved, not consistent with ACS, though ischemic cardiomyopathy remains on differential.  - ASA, BB, statin    Chronic back pain, spinal stenosis, neurogenic claudication, peripheral neuropathy, T11 compression fracture: Follows with pain medicine specialist ([place]).  - On stable dose oxycodone which we've reordered. 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 Sherwood Gambler.   HLD: LDL is 89.  - Consider starting statin pending further shared decision making.     Hypokalemia: Supplemented   BPH: Voiding regularly - Continue tamsulosin   DNR: POA.  Consultants: Cardiology Procedures performed: None  Disposition: Home Diet recommendation:  Cardiac diet DISCHARGE MEDICATION: Allergies as of 06/22/2022  Reactions   Amlodipine Swelling, Other (See Comments)    Ankle swelling   Lyrica [pregabalin] Swelling, Other (See Comments)   Hands and feet swell   Gabapentin Other (See Comments)   Lower extremity edema- knees and feet        Medication List     STOP taking these medications    fluconazole 150 MG tablet Commonly known as: DIFLUCAN   methylPREDNISolone 4 MG Tbpk tablet Commonly known as: MEDROL DOSEPAK       TAKE these medications    Aspirin Low Dose 81 MG tablet Generic drug: aspirin EC Take 1 tablet (81 mg total) by mouth daily. Swallow whole.   clotrimazole 1 % cream Commonly known as: LOTRIMIN APPLY TO AFFECTED AREA TWICE A DAY What changed: See the new instructions.   Eliquis DVT/PE Starter Pack Generic drug: Apixaban Starter Pack ('10mg'$  and '5mg'$ ) Take 2 tablets ('10mg'$ ) by mouth twice daily for 7 days, then 1 tablet ('5mg'$ ) twice daily   fluticasone 50 MCG/ACT nasal spray Commonly known as: FLONASE Place 1 spray into both nostrils in the morning.   furosemide 20 MG tablet Commonly known as: LASIX Take 1 tablet (20 mg total) by mouth daily.   losartan 50 MG tablet Commonly known as: COZAAR Take 1 tablet (50 mg total) by mouth daily.   metoprolol succinate 50 MG 24 hr tablet Commonly known as: Toprol XL Take 1 tablet (50 mg total) by mouth daily.   Oxycodone HCl 10 MG Tabs Take 10-20 mg by mouth See admin instructions. Take 20 mg by mouth in the morning and evening. May take an additional 10 mg once a day around noontime as needed for unresolved pain.   oxyCODONE 5 MG immediate release tablet Commonly known as: Oxy IR/ROXICODONE Take 1 tablet (5 mg total) by mouth every 4 (four) hours as needed for breakthrough pain or severe pain.   Symproic 0.2 MG Tabs Generic drug: Naldemedine Tosylate Take 0.2 mg by mouth in the morning.   tamsulosin 0.4 MG Caps capsule Commonly known as: FLOMAX TAKE 1 CAPSULE BY MOUTH EVERY DAY What changed: when to take this        Follow-up Information     Dixie Dials,  MD Follow up in 1 month(s).   Specialty: Cardiology Contact information: [place] [address] [phone]         Hoyt Koch, MD Follow up.   Specialty: Internal Medicine Contact information: [place] [address] [phone]                Discharge Exam: Danley Danker Weights   06/19/22 1153 06/22/22 0409  Weight: 94.3 kg 86.2 kg  Pleasant elderly male in no distress Clear, nonlabored RRR, no MRG. Stable LE edema with hyperpigmented lower legs.   Condition at discharge: stable  The results of significant diagnostics from this hospitalization (including imaging, microbiology, ancillary and laboratory) are listed below for reference.   Imaging Studies: ECHOCARDIOGRAM COMPLETE  Result Date: 06/20/2022    ECHOCARDIOGRAM REPORT   Patient Name:   DOVER HEAD Date of Exam: 06/20/2022 Medical Rec #:  [id number]       Height:       73.0 in Accession #:    [id number]      Weight:       208.0 lb Date of Birth:  26-Jun-1932        BSA:          2.188 m Patient  Age:    88 years        BP:           189/46 mmHg Patient Gender: M               HR:           94 bpm. Exam Location:  Inpatient Procedure: 2D Echo Indications:    pulmonary embolus  History:        Patient has prior history of Echocardiogram examinations. CHF,                 CAD; Risk Factors:Hypertension and Dyslipidemia.  Sonographer:    Johny Chess RDCS Referring Phys: [id number] [place]  1. Left ventricular ejection fraction, by estimation, is 40%. The left ventricle has mild to moderately decreased function. The left ventricle demonstrates global hypokinesis. There is mild concentric left ventricular hypertrophy. Indeterminate diastolic filling due to E-A fusion.  2. Right ventricular systolic function is normal. The right ventricular size is normal. Tricuspid regurgitation signal is inadequate for assessing PA pressure.  3. Left atrial size was mild  to moderately dilated.  4. The mitral valve is normal in structure. Trivial mitral valve regurgitation. No evidence of mitral stenosis.  5. The aortic valve is tricuspid. Aortic valve regurgitation is not visualized. No aortic stenosis is present.  6. The inferior vena cava is normal in size with greater than 50% respiratory variability, suggesting right atrial pressure of 3 mmHg. FINDINGS  Left Ventricle: Left ventricular ejection fraction, by estimation, is 40%. The left ventricle has mild to moderately decreased function. The left ventricle demonstrates global hypokinesis. The left ventricular internal cavity size was normal in size. There is mild concentric left ventricular hypertrophy. Indeterminate diastolic filling due to E-A fusion. Right Ventricle: The right ventricular size is normal. No increase in right ventricular wall thickness. Right ventricular systolic function is normal. Tricuspid regurgitation signal is inadequate for assessing PA pressure. Left Atrium: Left atrial size was mild to moderately dilated. Right Atrium: Right atrial size was normal in size. Pericardium: There is no evidence of pericardial effusion. Mitral Valve: The mitral valve is normal in structure. Mild mitral annular calcification. Trivial mitral valve regurgitation. No evidence of mitral valve stenosis. Tricuspid Valve: The tricuspid valve is normal in structure. Tricuspid valve regurgitation is not demonstrated. Aortic Valve: The aortic valve is tricuspid. Aortic valve regurgitation is not visualized. No aortic stenosis is present. Pulmonic Valve: The pulmonic valve was normal in structure. Pulmonic valve regurgitation is not visualized. Aorta: The aortic root is normal in size and structure. Venous: The inferior vena cava is normal in size with greater than 50% respiratory variability, suggesting right atrial pressure of 3 mmHg. IAS/Shunts: No atrial level shunt detected by color flow Doppler.  LEFT VENTRICLE PLAX 2D LVIDd:          5.10 cm     Diastology LVIDs:         3.80 cm     LV e' lateral: 5.91 cm/s LV PW:         1.30 cm LV IVS:        0.90 cm LVOT diam:     2.10 cm LV SV:         64 LV SV Index:   29 LVOT Area:     3.46 cm  LV Volumes (MOD) LV vol d, MOD A2C: 91.8 ml LV vol d, MOD A4C: 92.2 ml LV vol s, MOD A2C: 47.2 ml LV vol  s, MOD A4C: 54.2 ml LV SV MOD A2C:     44.6 ml LV SV MOD A4C:     92.2 ml LV SV MOD BP:      41.9 ml RIGHT VENTRICLE             IVC RV S prime:     22.20 cm/s  IVC diam: 1.60 cm TAPSE (M-mode): 1.3 cm LEFT ATRIUM             Index        RIGHT ATRIUM           Index LA diam:        4.60 cm 2.10 cm/m   RA Area:     16.10 cm LA Vol (A2C):   64.2 ml 29.34 ml/m  RA Volume:   42.50 ml  19.43 ml/m LA Vol (A4C):   89.3 ml 40.82 ml/m LA Biplane Vol: 78.5 ml 35.88 ml/m  AORTIC VALVE LVOT Vmax:   93.10 cm/s LVOT Vmean:  60.900 cm/s LVOT VTI:    0.185 m  AORTA Ao Root diam: 3.40 cm Ao Asc diam:  3.30 cm  SHUNTS Systemic VTI:  0.18 m Systemic Diam: 2.10 cm Dalton McleanMD Electronically signed by Franki Monte Signature Date/Time: 06/20/2022/5:57:55 PM    Final    VAS [place] LOWER EXTREMITY VENOUS (DVT) (7a-7p)  Result Date: 06/20/2022  Lower Venous DVT Study Patient Name:  MATISSE SALAIS  Date of Exam:   06/20/2022 Medical Rec #: [id number]        Accession #:    [id number] Date of Birth: [birth year]         Patient Gender: M Patient Age:   43 years Exam Location:  [facility] Procedure:      VAS [place] LOWER EXTREMITY VENOUS (DVT) Referring Phys: JULIE HAVILAND --------------------------------------------------------------------------------  Indications: Pulmonary embolism.  Limitations: Skin changes bilateral calves, tenderness to palpation. Comparison Study: 08-08-2020 Prior bilateral lower extremity venous study was                   negative for DVT. Performing Technologist: Darlin Coco RDMS, RVT  Examination Guidelines: A complete evaluation includes B-mode imaging, spectral Doppler, color Doppler, and  power Doppler as needed of all accessible portions of each vessel. Bilateral testing is considered an integral part of a complete examination. Limited examinations for reoccurring indications may be performed as noted. The reflux portion of the exam is performed with the patient in reverse Trendelenburg.  +---------+---------------+---------+-----------+----------+-------------------+ RIGHT    CompressibilityPhasicitySpontaneityPropertiesThrombus Aging      +---------+---------------+---------+-----------+----------+-------------------+ CFV      Full           Yes      Yes                                      +---------+---------------+---------+-----------+----------+-------------------+ SFJ      Full                                                             +---------+---------------+---------+-----------+----------+-------------------+ FV Prox  Full                                                             +---------+---------------+---------+-----------+----------+-------------------+  FV Mid   Full                                                             +---------+---------------+---------+-----------+----------+-------------------+ FV DistalFull                                                             +---------+---------------+---------+-----------+----------+-------------------+ PFV      Full                                                             +---------+---------------+---------+-----------+----------+-------------------+ POP      Full           Yes      Yes                                      +---------+---------------+---------+-----------+----------+-------------------+ PTV      Full                                         Not well visualized                                                       secondary to skin                                                         changes              +---------+---------------+---------+-----------+----------+-------------------+ PERO     Full                                         Not well visualized                                                       secondary to skin                                                         changes             +---------+---------------+---------+-----------+----------+-------------------+  Gastroc  Full                                                             +---------+---------------+---------+-----------+----------+-------------------+   +---------+---------------+---------+-----------+----------+--------------+ LEFT     CompressibilityPhasicitySpontaneityPropertiesThrombus Aging +---------+---------------+---------+-----------+----------+--------------+ CFV      Full           Yes      Yes                                 +---------+---------------+---------+-----------+----------+--------------+ SFJ      Full                                                        +---------+---------------+---------+-----------+----------+--------------+ FV Prox  Full                                                        +---------+---------------+---------+-----------+----------+--------------+ FV Mid   Full                                                        +---------+---------------+---------+-----------+----------+--------------+ FV DistalFull                                                        +---------+---------------+---------+-----------+----------+--------------+ PFV      Full                                                        +---------+---------------+---------+-----------+----------+--------------+ POP      Full           Yes      Yes                                 +---------+---------------+---------+-----------+----------+--------------+ PTV      Full                                                         +---------+---------------+---------+-----------+----------+--------------+ PERO     Full                                                        +---------+---------------+---------+-----------+----------+--------------+  Gastroc  Full                                                        +---------+---------------+---------+-----------+----------+--------------+     Summary: RIGHT: - There is no evidence of deep vein thrombosis in the lower extremity. However, portions of this examination were limited- see technologist comments above.  - A cystic structure is visualized within the muscle fascia of the proximal calf.  LEFT: - There is no evidence of deep vein thrombosis in the lower extremity.  - No cystic structure found in the popliteal fossa.  *See table(s) above for measurements and observations. Electronically signed by Jamelle Haring on 06/20/2022 at 3:54:24 PM.    Final    CT Angio Chest PE W and/or Wo Contrast  Result Date: 06/20/2022 CLINICAL DATA:  PE Suspected EXAM: CT ANGIOGRAPHY CHEST WITH CONTRAST TECHNIQUE: Multidetector CT imaging of the chest was performed using the standard protocol during bolus administration of intravenous contrast. Multiplanar CT image reconstructions and MIPs were obtained to evaluate the vascular anatomy. RADIATION DOSE REDUCTION: This exam was performed according to the departmental dose-optimization program which includes automated exposure control, adjustment of the mA and/or kV according to patient size and/or use of iterative reconstruction technique. CONTRAST:  53m OMNIPAQUE IOHEXOL 350 MG/ML SOLN COMPARISON:  CT chest 08/10/2018, MRI thoracic spine 09/26/2021 FINDINGS: Cardiovascular: Satisfactory opacification of the pulmonary arteries to the segmental level. There is evidence of PE and right upper lobe lobar and segmental pulmonary arteries (series 6, image 117). There is no evidence of right heart strain. No pericardial effusion. Moderate coronary  artery calcifications. Mediastinum/Nodes: No enlarged mediastinal, hilar, or axillary lymph nodes. Thyroid gland, trachea, and esophagus demonstrate no significant findings. Lungs/Pleura: No focal consolidation or large pulmonary nodule visualized. Assessment for small pulmonary nodules is slightly limited due to respiratory motion artifact. No pleural effusion or pneumothorax. Upper Abdomen: Patient is status post cholecystectomy. There is evidence of hepatic steatosis. There are vascular calcifications of the abdominal aorta and the splenic artery. Musculoskeletal: There are compression deformities in the lower thoracic spine at the T11-T12 vertebral body levels. There are post kyphoplasty changes at the L1 and L2 levels. These are unchanged compared to MRI thoracic spine 09/26/2021. There is a ground-glass lesion of the left scapula, which likely represents a healing fracture. Review of the MIP images confirms the above findings. IMPRESSION: Acute pulmonary embolism in the right upper lobe lobar and segmental pulmonary arteries. No evidence of right heart strain. Aortic Atherosclerosis (ICD10-I70.0). Electronically Signed   By: HMarin RobertsM.D.   On: 06/20/2022 11:27   DG Chest 2 View  Result Date: 06/19/2022 CLINICAL DATA:  Chest pain. EXAM: CHEST - 2 VIEW COMPARISON:  Chest x-ray June 4, 22. FINDINGS: The heart size and mediastinal contours are within normal limits. Both lungs are clear. No visible pleural effusions or pneumothorax. No acute osseous abnormality. Polyarticular degenerative change. Cervical spinal fusion hardware. IMPRESSION: No active cardiopulmonary disease. Electronically Signed   By: FMargaretha SheffieldM.D.   On: 06/19/2022 13:16    Microbiology: Results for orders placed or performed in visit on 09/05/21  Anaerobic and Aerobic Culture     Status: None   Collection Time: 09/05/21  2:02 PM  Result Value Ref Range Status   MICRO NUMBER: [id number] Final   SPECIMEN  QUALITY: Adequate   Final   Source: RIGHT LATERAL HIP SUPERFICIAL TO GREAT  Final   STATUS: FINAL  Final   GRAM STAIN:   Final    No organisms or white blood cells seen No epithelial cells seen   ANA RESULT: No anaerobes isolated.  Final   MICRO NUMBER: [id number]  Final   SPECIMEN QUALITY: Adequate  Final   SOURCE: RIGHT LATERAL HIP SUPERFICIAL TO GREAT  Final   STATUS: FINAL  Final   AER RESULT: No Growth  Final   *Note: Due to a large number of results and/or encounters for the requested time period, some results have not been displayed. A complete set of results can be found in Results Review.    Labs: CBC: Recent Labs  Lab 06/19/22 1233 06/21/22 0545 06/22/22 0108  WBC 9.3 7.0 7.7  HGB 14.5 13.5 13.0  HCT 43.9 40.5 39.7  MCV 89.4 89.6 90.4  PLT 400 336 209   Basic Metabolic Panel: Recent Labs  Lab 06/19/22 1233 06/21/22 0545 06/22/22 0108  NA 140 141 138  K 3.4* 3.4* 3.8  CL 106 105 107  CO2 21* 25 24  GLUCOSE 109* 93 93  BUN '10 13 19  '$ CREATININE 1.07 1.31* 1.27*  CALCIUM 9.6 8.7* 8.4*   Liver Function Tests: No results for input(s): "AST", "ALT", "ALKPHOS", "BILITOT", "PROT", "ALBUMIN" in the last 168 hours. CBG: No results for input(s): "GLUCAP" in the last 168 hours.  Discharge time spent: greater than 30 minutes.  Signed: Patrecia Pour, MD [facility] 06/22/2022

## 2022-06-22 NOTE — Consult Note (Signed)
Ref: Hoyt Koch, MD   Subjective:  Awake. Feeling better as far as breathing goes. No chest pain.   Objective:  Vital Signs in the last 24 hours: Temp:  [97.8 F (36.6 C)-98.3 F (36.8 C)] 97.8 F (36.6 C) (11/10 0409) Pulse Rate:  [83-109] 93 (11/10 0409) Cardiac Rhythm: Sinus tachycardia (11/09 1902) Resp:  [16-18] 18 (11/10 0409) BP: (110-203)/(69-104) 152/78 (11/10 0409) SpO2:  [93 %-98 %] 93 % (11/10 0409) Weight:  [86.2 kg] 86.2 kg (11/10 0409)  Physical Exam: BP Readings from Last 1 Encounters:  06/22/22 (!) 152/78     Wt Readings from Last 1 Encounters:  06/22/22 86.2 kg    Weight change:  Body mass index is 25.07 kg/m. HEENT: [place]/AT, Eyes-Blue, Conjunctiva-Pink, Sclera-Non-icteric Neck: No JVD, No bruit, Trachea midline. Lungs:  Clear, Bilateral. Cardiac:  Regular rhythm, normal S1 and S2, no S3. II/VI systolic murmur. Abdomen:  Soft, non-tender. BS present. Extremities:  No edema present. No cyanosis. No clubbing. CNS: AxOx3, Cranial nerves grossly intact, moves all 4 extremities.  Skin: Warm and dry.   Intake/Output from previous day: 11/09 0701 - 11/10 0700 In: 639.6 [P.O.:240; I.V.:399.6] Out: 1175 [Urine:1175]    Lab Results: BMET    Component Value Date/Time   NA 138 06/22/2022 0108   NA 141 06/21/2022 0545   NA 140 06/19/2022 1233   K 3.8 06/22/2022 0108   K 3.4 (L) 06/21/2022 0545   K 3.4 (L) 06/19/2022 1233   CL 107 06/22/2022 0108   CL 105 06/21/2022 0545   CL 106 06/19/2022 1233   CO2 24 06/22/2022 0108   CO2 25 06/21/2022 0545   CO2 21 (L) 06/19/2022 1233   GLUCOSE 93 06/22/2022 0108   GLUCOSE 93 06/21/2022 0545   GLUCOSE 109 (H) 06/19/2022 1233   BUN 19 06/22/2022 0108   BUN 13 06/21/2022 0545   BUN 10 06/19/2022 1233   CREATININE 1.27 (H) 06/22/2022 0108   CREATININE 1.31 (H) 06/21/2022 0545   CREATININE 1.07 06/19/2022 1233   CREATININE 1.00 05/04/2020 1500   CREATININE 1.22 (H) 04/25/2020 1622   CREATININE 1.17  (H) 04/08/2020 1005   CALCIUM 8.4 (L) 06/22/2022 0108   CALCIUM 8.7 (L) 06/21/2022 0545   CALCIUM 9.6 06/19/2022 1233   GFRNONAA 54 (L) 06/22/2022 0108   GFRNONAA 52 (L) 06/21/2022 0545   GFRNONAA >60 06/19/2022 1233   GFRAA 58 (L) 01/19/2020 0659   GFRAA 60 (L) 04/30/2019 1031   GFRAA 56 (L) 08/12/2018 0230   CBC    Component Value Date/Time   WBC 7.7 06/22/2022 0108   RBC 4.39 06/22/2022 0108   HGB 13.0 06/22/2022 0108   HCT 39.7 06/22/2022 0108   PLT 363 06/22/2022 0108   MCV 90.4 06/22/2022 0108   MCH 29.6 06/22/2022 0108   MCHC 32.7 06/22/2022 0108   RDW 15.9 (H) 06/22/2022 0108   LYMPHSABS 1.8 01/03/2022 1050   MONOABS 0.8 01/03/2022 1050   EOSABS 0.2 01/03/2022 1050   BASOSABS 0.1 01/03/2022 1050   HEPATIC Function Panel Recent Labs    01/03/22 1050  PROT 6.5  ALBUMIN 3.8  AST 21  ALT 16  ALKPHOS 112   HEMOGLOBIN A1C No results found for: "MPG" CARDIAC ENZYMES Lab Results  Component Value Date   CKTOTAL 54 04/20/2016   CKMB 4.0 12/26/2012   TROPONINI <0.03 08/11/2018   TROPONINI <0.03 08/10/2018   TROPONINI <0.03 08/10/2018   BNP Recent Labs    01/03/22 1050  PROBNP 266.0*  TSH No results for input(s): "TSH" in the last 8760 hours. CHOLESTEROL No results for input(s): "CHOL" in the last 8760 hours.  Scheduled Meds:  aspirin EC  81 mg Oral Daily   furosemide  20 mg Oral Daily   hydrALAZINE  25 mg Oral Q8H   losartan  25 mg Oral Daily   metoprolol tartrate  50 mg Oral BID   Naldemedine Tosylate  0.2 mg Oral Daily   oxyCODONE  20 mg Oral BID   potassium chloride  10 mEq Oral BID   sodium chloride flush  3 mL Intravenous Q12H   tamsulosin  0.4 mg Oral Daily   Continuous Infusions:  heparin 1,650 Units/hr (06/22/22 0606)   PRN Meds:.acetaminophen **OR** acetaminophen, labetalol, oxyCODONE, polyethylene glycol  Assessment/Plan: Acute PE Atypical chest pain CAD Chronic systolic left heart failure HTN HLD Chronic low back pain CKD,  II  Plan: Continue medications. Increase activity. Patient prefers waiting for additional cardiac work up for now. F/U in 1 month or earlier as needed. Pharmacy consult for Eliquis for PE.   LOS: 0 days   Time spent including chart review, lab review, examination, discussion with patient/doctor/Nurse : 30 min   Dixie Dials  MD  06/22/2022, 8:06 AM

## 2022-06-22 NOTE — Progress Notes (Signed)
Explained discharge instructions to patient. Reviewed follow up appointment and next medication administration times. Also reviewed education. Patient verbalized having an understanding for instructions given. All belongings are in the patient's possession to include TOC meds. IV and telemetry were removed by floor staff prior to explaining discharge summary. No other needs verbalized. Transported downstairs to the discharge lounge to await ride.

## 2022-06-25 ENCOUNTER — Telehealth: Payer: Self-pay

## 2022-06-25 ENCOUNTER — Telehealth: Payer: Self-pay | Admitting: Internal Medicine

## 2022-06-25 NOTE — Telephone Encounter (Signed)
Patient called and wanted to know if drinking alcohol is completely out of the question with an acute pulmonary embolism.  Please call patient and advise.

## 2022-06-25 NOTE — Telephone Encounter (Signed)
I would not recommend more than 1 drink alcohol per day as it can interfere with the medication.

## 2022-06-25 NOTE — Telephone Encounter (Signed)
Transition Care Management Follow-up Telephone Call Date of discharge and from where: [place] 06-22-22 Dx: acute pulmonary embolism How have you been since you were released from the hospital? Feel a little different but hard to describe  Any questions or concerns? No  Items Reviewed: Did the pt receive and understand the discharge instructions provided? Yes  Medications obtained and verified? Yes  Other? No  Any new allergies since your discharge? No  Dietary orders reviewed? Yes Do you have support at home? Yes   Home Care and Equipment/Supplies: Were home health services ordered? no If so, what is the name of the agency? na  Has the agency set up a time to come to the patient's home? not applicable Were any new equipment or medical supplies ordered?  No What is the name of the medical supply agency? na Were you able to get the supplies/equipment? not applicable Do you have any questions related to the use of the equipment or supplies? No  Functional Questionnaire: (I = Independent and D = Dependent) ADLs: I  Bathing/Dressing- I  Meal Prep- I  Eating- I  Maintaining continence- I  Transferring/Ambulation- I  Managing Meds- I  Follow up appointments reviewed:  PCP Hospital f/u appt confirmed? Yes  [facility]  . Are transportation arrangements needed? No  If their condition worsens, is the pt aware to call PCP or go to the Emergency Dept.? Yes Was the patient provided with contact information for the PCP's office or ED? Yes Was to pt encouraged to call back with questions or concerns? Yes   Juanda Crumble LPN [facility] Direct Dial [phone]

## 2022-06-27 ENCOUNTER — Other Ambulatory Visit (HOSPITAL_COMMUNITY): Payer: Self-pay

## 2022-06-27 ENCOUNTER — Telehealth (HOSPITAL_COMMUNITY): Payer: Self-pay

## 2022-06-27 NOTE — Telephone Encounter (Signed)
Pt was informed of Dr. Nathanial Millman advice. Pt states he understands and does not have any questions or concerns at this time.

## 2022-06-27 NOTE — Telephone Encounter (Signed)
Pharmacy Transitions of Care Follow-up Telephone Call  Date of discharge: 06/22/22  Discharge Diagnosis: Acute PE  How have you been since you were released from the hospital? Patient has a history of gout and seems to be having an active flare. He already put in a call to cardiology office but advised him to also consult his PCP for this. He reports doing well otherwise, counseled on how to take Eliquis and when to switch to lower dose. Patient not provided with refills on medications so advised to contact PCP and cardiologist for refills.   Medication changes made at discharge: START taking these medications  START taking these medications Aspirin Low Dose 81 MG tablet Generic drug: aspirin EC Take 1 tablet (81 mg total) by mouth daily. Swallow whole.  Eliquis DVT/PE Starter Pack Generic drug: Apixaban Starter Pack ('10mg'$  and '5mg'$ ) Take 2 tablets ('10mg'$ ) by mouth twice daily for 7 days, then 1 tablet ('5mg'$ ) twice daily  losartan 50 MG tablet Commonly known as: COZAAR Take 1 tablet (50 mg total) by mouth daily.  metoprolol succinate 50 MG 24 hr tablet Commonly known as: Toprol XL Take 1 tablet (50 mg total) by mouth daily.   CHANGE how you take these medications  CHANGE how you take these medications clotrimazole 1 % cream Commonly known as: LOTRIMIN APPLY TO AFFECTED AREA TWICE A DAY What changed: See the new instructions.  tamsulosin 0.4 MG Caps capsule Commonly known as: FLOMAX TAKE 1 CAPSULE BY MOUTH EVERY DAY What changed: when to take this   STOP taking these medications  STOP taking these medications fluconazole 150 MG tablet Commonly known as: DIFLUCAN  methylPREDNISolone 4 MG Tbpk tablet Commonly known as: MEDROL DOSEPAK   Medication changes verified by the patient? Yes (Yes/No)    Medication Accessibility:  Home Pharmacy: [facility] [address]   Was the patient provided with refills on discharged medications? No   Have all prescriptions been transferred from  [facility] to home pharmacy? No, no refills   Is the patient interested in using a [facility]? No  Medication Review:  APIXABAN (ELIQUIS)  Apixaban 10 mg BID initiated on 06/22/22 for PE. Will switch to apixaban 5 mg BID after 7 days (DATE 06/29/22).  - Discussed importance of taking medication around the same time every day.  - Advised patient of medications to avoid (NSAIDs, ASA maintenance doses>100 mg daily)  - Educated that Tylenol (acetaminophen) will be the preferred analgesic to prevent risk of bleeding. - Emphasized importance of monitoring for signs and symptoms of bleeding (abnormal bruising, prolonged bleeding, nose bleeds, bleeding from gums, discolored urine, black tarry stools)  - Advised patient to alert all providers of anticoagulation therapy prior to starting a new medication or having a procedure.  Follow-up Appointments:  PCP Hospital f/u appt confirmed? Yes Scheduled to see   07/03/2022  9:20 AM HOSPITAL FU 20 min [place] [facility] f/u appt confirmed? No calling today to get follow up scheduled.   Rodolph Bong, PharmD Candidate 06/27/2022 12:07 PM

## 2022-06-29 ENCOUNTER — Other Ambulatory Visit (HOSPITAL_COMMUNITY): Payer: Self-pay

## 2022-07-03 ENCOUNTER — Ambulatory Visit: Payer: Medicare Other | Admitting: Internal Medicine

## 2022-07-03 ENCOUNTER — Telehealth: Payer: Self-pay

## 2022-07-03 ENCOUNTER — Encounter: Payer: Self-pay | Admitting: Internal Medicine

## 2022-07-03 VITALS — BP 120/80 | HR 80 | Temp 98.2°F | Ht 73.0 in | Wt 202.0 lb

## 2022-07-03 DIAGNOSIS — I2699 Other pulmonary embolism without acute cor pulmonale: Secondary | ICD-10-CM | POA: Diagnosis not present

## 2022-07-03 DIAGNOSIS — M109 Gout, unspecified: Secondary | ICD-10-CM

## 2022-07-03 DIAGNOSIS — I5022 Chronic systolic (congestive) heart failure: Secondary | ICD-10-CM

## 2022-07-03 MED ORDER — APIXABAN 5 MG PO TABS: 5.0000 mg | ORAL_TABLET | Freq: Two times a day (BID) | ORAL | 1 refills | Status: DC

## 2022-07-03 MED ORDER — PREDNISONE 20 MG PO TABS: 40.0000 mg | ORAL_TABLET | Freq: Every day | ORAL | 0 refills | Status: AC

## 2022-07-03 NOTE — Assessment & Plan Note (Signed)
Symptoms are resolving. He is taking eliquis 5 mg BID and refilled for total of 6 months. We will follow up in 3 months and decide if we will do 3 or 6 months of treatment. Likely trigger lack of activity. He has no clinical signs of cancer and is up to date on age appropriate screening. CT chest without suspicious areas. We will not pursue further workup for hypercoagulable state at this time.

## 2022-07-03 NOTE — Assessment & Plan Note (Signed)
With acute flare today. Rx prednisone 40 mg daily for 1 week.

## 2022-07-03 NOTE — Telephone Encounter (Signed)
Checked on orders for walkers for Mr and Mrs adcock.

## 2022-07-03 NOTE — Patient Instructions (Addendum)
We have sent in the refills so the pharmacy has them when needed.  We will plan to keep you on the blood thinner for 3-6 months then stop.  We have sent in prednisone to take.

## 2022-07-03 NOTE — Assessment & Plan Note (Signed)
New reduction in EF to 40% in hospital and unclear if related to PE or ischemic disease. He did not have cath inpatient due to his preference and cardiology. He has followed up with cardiology (cannot review notes or plan).

## 2022-07-03 NOTE — Progress Notes (Signed)
   Subjective:   Patient ID: [name], male    DOB: 10/03/1931, 88 y.o.   MRN: [id number]  HPI The patient is an 88 YO man coming in for follow up hospital admission (in for chest pressure and found to have PE, anticoagulated and sent home on eliquis). Cardiology did see due to new low EF and they deferred cath for now. He is having resolution of the chest pressure and denies SOB (which he was also having). He is having low energy and low motivation. Denies new stomach problems. Having gout flare.  PMH, FMh, social history reviewed and updated  Review of Systems  Constitutional:  Positive for activity change, appetite change and fatigue.  HENT: Negative.    Eyes: Negative.   Respiratory:  Negative for cough, chest tightness and shortness of breath.   Cardiovascular:  Negative for chest pain, palpitations and leg swelling.  Gastrointestinal:  Negative for abdominal distention, abdominal pain, constipation, diarrhea, nausea and vomiting.  Musculoskeletal:  Positive for arthralgias, back pain and myalgias.  Skin: Negative.   Neurological:  Positive for weakness.  Psychiatric/Behavioral: Negative.      Objective:  Physical Exam Constitutional:      Appearance: He is well-developed. He is ill-appearing.  HENT:     Head: Normocephalic and atraumatic.  Cardiovascular:     Rate and Rhythm: Normal rate and regular rhythm.  Pulmonary:     Effort: Pulmonary effort is normal. No respiratory distress.     Breath sounds: Normal breath sounds. No wheezing or rales.  Abdominal:     General: Bowel sounds are normal. There is no distension.     Palpations: Abdomen is soft.     Tenderness: There is no abdominal tenderness. There is no rebound.  Musculoskeletal:        General: Tenderness present.     Cervical back: Normal range of motion.  Skin:    General: Skin is warm and dry.  Neurological:     Mental Status: He is alert and oriented to person, place, and time.     Coordination:  Coordination abnormal.     Comments: walker     Vitals:   07/03/22 0943  BP: 120/80  Pulse: 80  Temp: 98.2 F (36.8 C)  TempSrc: Oral  SpO2: 96%  Weight: 202 lb (91.6 kg)  Height: '6\' 1"'$  (1.854 m)    Assessment & Plan:  Visit time 25 minutes in face to face communication with patient and coordination of care, additional 15 minutes spent in record review, coordination or care, ordering tests, communicating/referring to other healthcare professionals, documenting in medical records all on the same day of the visit for total time 40 minutes spent on the visit.

## 2022-07-11 ENCOUNTER — Telehealth: Payer: Self-pay

## 2022-07-11 ENCOUNTER — Emergency Department (HOSPITAL_COMMUNITY)
Admission: EM | Admit: 2022-07-11 | Discharge: 2022-07-11 | Disposition: A | Discharge status: Home / Self Care | Payer: Medicare Other | Attending: Emergency Medicine | Admitting: Emergency Medicine

## 2022-07-11 ENCOUNTER — Other Ambulatory Visit: Payer: Self-pay

## 2022-07-11 DIAGNOSIS — X58XXXA Exposure to other specified factors, initial encounter: Secondary | ICD-10-CM | POA: Diagnosis not present

## 2022-07-11 DIAGNOSIS — Z7901 Long term (current) use of anticoagulants: Secondary | ICD-10-CM | POA: Insufficient documentation

## 2022-07-11 DIAGNOSIS — S59911A Unspecified injury of right forearm, initial encounter: Secondary | ICD-10-CM | POA: Diagnosis present

## 2022-07-11 DIAGNOSIS — S51811A Laceration without foreign body of right forearm, initial encounter: Secondary | ICD-10-CM | POA: Diagnosis not present

## 2022-07-11 MED ORDER — LIDOCAINE-EPINEPHRINE (PF) 2 %-1:200000 IJ SOLN
INTRAMUSCULAR | Status: AC
  Filled 2022-07-11: qty 20

## 2022-07-11 MED ORDER — LIDOCAINE-EPINEPHRINE (PF) 2 %-1:200000 IJ SOLN
20.0000 mL | Freq: Once | INTRAMUSCULAR | Status: AC
  Administered 2022-07-11: 20 mL

## 2022-07-11 NOTE — ED Provider Triage Note (Signed)
Emergency Medicine Provider Triage Evaluation Note  DAREN YEAGLE , a 88 y.o. male  was evaluated in triage.  Pt complains of laceration to right arm.  States he cut it earlier today while reaching in the freezer, developed a blood blister and then it popped.  He does take eliquis.  Last tetanus 2019.  Review of Systems  Positive: laceration Negative: fever  Physical Exam  BP (!) 160/108 (BP Location: Left Arm)   Pulse 94   Temp 98.1 F (36.7 C) (Oral)   Resp 20   SpO2 98%  Gen:   Awake, no distress   Resp:  Normal effort  MSK:   Moves extremities without difficulty  Other:  Laceration noted to right lateral forearm, tape tightly bound around the arm which was removed in triage, hand is cold but pulse palpable  Medical Decision Making  Medically screening exam initiated at 1:10 AM.  Appropriate orders placed.  KORI COLIN was informed that the remainder of the evaluation will be completed by another provider, this initial triage assessment does not replace that evaluation, and the importance of remaining in the ED until their evaluation is complete.  Laceration right lateral forearm.  On eliquis.  Tetanus UTD.  Will need wound care/repair.  Right hand is cold from tape tightly bound around wrist, pulse palpable.   Larene Pickett, PA-C 07/11/22 [phone]

## 2022-07-11 NOTE — ED Triage Notes (Signed)
Pt arrives with c/o right forearm laceration. Per pt, her had a blood blister that popped when he hit his arm. Pt takes Eliquis. Bleeding controlled in triage.

## 2022-07-11 NOTE — ED Provider Notes (Signed)
[facility] Emergency Department Provider Note MRN:  [id number]  Arrival date & time: 07/11/22     Chief Complaint   Laceration   History of Present Illness   [name] is a 88 y.o. year-old male presents to the ED with chief complaint of laceration to right forearm.  He states that he had a small blood blister which burst.  He is anticoagulated on Eliquis.  He has been unable to control bleeding at home.  History provided by patient.   Review of Systems  Pertinent positive and negative review of systems noted in HPI.    Physical Exam   Vitals:   07/11/22 0109  BP: (!) 160/108  Pulse: 94  Resp: 20  Temp: 98.1 F (36.7 C)  SpO2: 98%    CONSTITUTIONAL:  well-appearing, NAD NEURO:  Alert and oriented x 3, CN 3-12 grossly intact EYES:  eyes equal and reactive ENT/NECK:  Supple, no stridor  CARDIO:  normal rate, regular rhythm, appears well-perfused  PULM:  No respiratory distress,  GI/GU:  non-distended,  MSK/SPINE:  No gross deformities, no edema, moves all extremities  SKIN:  no rash, 0.5 cm laceration to right forearm.   *Additional and/or pertinent findings included in MDM below  Diagnostic and Interventional Summary    EKG Interpretation  Date/Time:    Ventricular Rate:    PR Interval:    QRS Duration:   QT Interval:    QTC Calculation:   R Axis:     Text Interpretation:         Labs Reviewed - No data to display  No orders to display    Medications  lidocaine-EPINEPHrine (XYLOCAINE W/EPI) 2 %-1:200000 (PF) injection (has no administration in time range)  lidocaine-EPINEPHrine (XYLOCAINE W/EPI) 2 %-1:200000 (PF) injection 20 mL (20 mLs Infiltration Given by Other 07/11/22 0255)     Procedures  /  Critical Care .Marland KitchenLaceration Repair  Date/Time: 07/11/2022 5:04 AM  Performed by: Montine Circle, PA-C Authorized by: Montine Circle, PA-C   Consent:    Consent obtained:  Verbal   Consent given by:  Patient    Risks, benefits, and alternatives were discussed: yes     Risks discussed:  Poor wound healing, vascular damage and pain   Alternatives discussed:  No treatment Universal protocol:    Procedure explained and questions answered to patient or proxy's satisfaction: yes     Relevant documents present and verified: yes     Test results available: yes     Imaging studies available: yes     Required blood products, implants, devices, and special equipment available: yes     Site/side marked: yes     Immediately prior to procedure, a time out was called: yes     Patient identity confirmed:  Verbally with patient Anesthesia:    Anesthesia method:  Local infiltration   Local anesthetic:  Lidocaine 1% WITH epi Laceration details:    Location:  Shoulder/arm   Shoulder/arm location:  R lower arm   Length (cm):  0.5 Skin repair:    Repair method:  Sutures   Suture size:  4-0   Suture material:  Prolene   Suture technique:  Figure eight   Number of sutures:  2 Approximation:    Approximation:  Close Repair type:    Repair type:  Simple Post-procedure details:    Dressing:  Tube gauze and non-adherent dressing   Procedure completion:  Tolerated well, no immediate complications   ED Course and Medical  Decision Making  I have reviewed the triage vital signs, the nursing notes, and pertinent available records from the EMR.  Social Determinants Affecting Complexity of Care: Patient has no clinically significant social determinants affecting this chief complaint..   ED Course:    Medical Decision Making Patient here with bleeding from right forearm.  Anticoagulated.  Bleeding difficult to control at home.  Bleeding ultimately controlled with 2 figure of eight sutures.  Wound care instructions given.  Risk Prescription drug management.     Consultants: No consultations were needed in caring for this patient.   Treatment and Plan: Emergency department workup does not suggest an  emergent condition requiring admission or immediate intervention beyond  what has been performed at this time. The patient is safe for discharge and has  been instructed to return immediately for worsening symptoms, change in  symptoms or any other concerns    Final Clinical Impressions(s) / ED Diagnoses     ICD-10-CM   1. Laceration of right forearm, initial encounter  K93.818E       ED Discharge Orders     None         Discharge Instructions Discussed with and Provided to Patient:   Discharge Instructions   None      Montine Circle, PA-C 07/11/22 0507    Dina Rich Barbette Hair, MD 07/12/22 [phone]

## 2022-07-11 NOTE — Telephone Encounter (Signed)
Transition Care Management Follow-up Telephone Call Date of discharge and from where: [facility] 07/11/2022 How have you been since you were released from the hospital? Pt states that he is worn out but more so tired.  Any questions or concerns? Yes Patient is concerned about being stressed.  Items Reviewed: Did the pt receive and understand the discharge instructions provided? Yes  Medications obtained and verified? No  Other?  N/a Any new allergies since your discharge? No  Dietary orders reviewed? No Do you have support at home? Yes   Home Care and Equipment/Supplies: Were home health services ordered? no If so, what is the name of the agency? N/a  Has the agency set up a time to come to the patient's home? no Were any new equipment or medical supplies ordered?  No What is the name of the medical supply agency? N/a Were you able to get the supplies/equipment? no Do you have any questions related to the use of the equipment or supplies? No  Functional Questionnaire: (I = Independent and D = Dependent) ADLs: I  Bathing/Dressing- I  Meal Prep- I  Eating- I  Maintaining continence- I  Transferring/Ambulation- I  Managing Meds- I  Follow up appointments reviewed:  PCP Hospital f/u appt confirmed? Yes  Scheduled to see Dr Sharlet Salina on 07/18/2022 @ 10:40AM. [facility] f/u appt confirmed?  N/A  Scheduled to see n/a on n/a @ n/a. Are transportation arrangements needed? No  If their condition worsens, is the pt aware to call PCP or go to the Emergency Dept.? Yes Was the patient provided with contact information for the PCP's office or ED? Yes Was to pt encouraged to call back with questions or concerns? Yes

## 2022-07-11 NOTE — ED Notes (Signed)
EDP at bedside performing procedure

## 2022-07-13 ENCOUNTER — Encounter: Payer: Self-pay | Admitting: Internal Medicine

## 2022-07-15 ENCOUNTER — Emergency Department (HOSPITAL_COMMUNITY)
Admission: EM | Admit: 2022-07-15 | Discharge: 2022-07-16 | Disposition: A | Discharge status: Home / Self Care | Payer: Medicare Other | Attending: Emergency Medicine | Admitting: Emergency Medicine

## 2022-07-15 ENCOUNTER — Other Ambulatory Visit: Payer: Self-pay

## 2022-07-15 DIAGNOSIS — I509 Heart failure, unspecified: Secondary | ICD-10-CM | POA: Insufficient documentation

## 2022-07-15 DIAGNOSIS — Z87891 Personal history of nicotine dependence: Secondary | ICD-10-CM | POA: Insufficient documentation

## 2022-07-15 DIAGNOSIS — I2510 Atherosclerotic heart disease of native coronary artery without angina pectoris: Secondary | ICD-10-CM | POA: Insufficient documentation

## 2022-07-15 DIAGNOSIS — X58XXXD Exposure to other specified factors, subsequent encounter: Secondary | ICD-10-CM | POA: Insufficient documentation

## 2022-07-15 DIAGNOSIS — Z85828 Personal history of other malignant neoplasm of skin: Secondary | ICD-10-CM | POA: Diagnosis not present

## 2022-07-15 DIAGNOSIS — S51801D Unspecified open wound of right forearm, subsequent encounter: Secondary | ICD-10-CM | POA: Diagnosis not present

## 2022-07-15 DIAGNOSIS — Z7901 Long term (current) use of anticoagulants: Secondary | ICD-10-CM | POA: Diagnosis not present

## 2022-07-15 DIAGNOSIS — Z5189 Encounter for other specified aftercare: Secondary | ICD-10-CM | POA: Diagnosis present

## 2022-07-15 DIAGNOSIS — I110 Hypertensive heart disease with heart failure: Secondary | ICD-10-CM | POA: Insufficient documentation

## 2022-07-15 DIAGNOSIS — T148XXA Other injury of unspecified body region, initial encounter: Secondary | ICD-10-CM

## 2022-07-15 NOTE — ED Provider Triage Note (Signed)
Emergency Medicine Provider Triage Evaluation Note  [name] , a 88 y.o. male  was evaluated in triage.  Pt complains of bleeding from right wrist.  Patient is on Eliquis for history of PE.  Patient was here 5 days ago after had a "blood blister" break open and started bleeding.  He states that he had 2 stitches placed.  About 2 and half hours ago, the bleeding started again.  Pressure bandage applied and bleeding currently stopped.  Review of Systems  Positive: Wound Negative: Syncope  Physical Exam  BP (!) 159/90 (BP Location: Left Arm)   Pulse 82   Temp 98.8 F (37.1 C) (Oral)   Resp 16   Ht '6\' 1"'$  (1.854 m)   Wt 91.6 kg   SpO2 94%   BMI 26.64 kg/m  Gen:   Awake, no distress   Resp:  Normal effort  MSK:   Moves extremities without difficulty  Other:  Patient has pressure bandage in place right wrist, normal distal radial pulse, normal cap refill.  I did not take the dressing down to examine it in triage.  Medical Decision Making  Medically screening exam initiated at 4:07 PM.  Appropriate orders placed.  [name] was informed that the remainder of the evaluation will be completed by another provider, this initial triage assessment does not replace that evaluation, and the importance of remaining in the ED until their evaluation is complete.     Carlisle Cater, PA-C 07/15/22 1608

## 2022-07-15 NOTE — ED Triage Notes (Signed)
The pt is on a blood thinner and 11 28  a vessel in his rt wrist just started bleeding and he came to the hospital and the bleeding was stopped until today  he had a  sudden onset of bleeding in the same wrist he has a pressure bandage on it now and appears to be controlled

## 2022-07-15 NOTE — ED Notes (Signed)
Quick dispo   [name], [name] 07/15/22 2207

## 2022-07-16 ENCOUNTER — Telehealth: Payer: Self-pay

## 2022-07-16 MED ORDER — LIDOCAINE HCL (PF) 1 % IJ SOLN
30.0000 mL | Freq: Once | INTRAMUSCULAR | Status: AC
  Administered 2022-07-16: 30 mL
  Filled 2022-07-16: qty 30

## 2022-07-16 NOTE — ED Provider Notes (Signed)
[facility] Emergency Department Provider Note MRN:  [id number]  Arrival date & time: 07/16/22     Chief Complaint   Wound Check   History of Present Illness   [name] is a 88 y.o. year-old male with a history of DVT presenting to the ED with chief complaint of wound check.  Patient sustained a laceration recently, has had some bleeding from the wound over the past several hours.  Review of Systems  A thorough review of systems was obtained and all systems are negative except as noted in the HPI and PMH.   Patient's Health History    Past Medical History:  Diagnosis Date   ACTINIC KERATOSIS, HEAD 03/23/2008   ALLERGIC RHINITIS 03/27/2007   Arthritis    BENIGN PROSTATIC HYPERTROPHY, WITH URINARY OBSTRUCTION 10/16/2007   CAD 08/18/2007   "patient denies any issues with heart, does not see cardiologist"   Carpal tunnel syndrome, bilateral 09/04/2016   CARPAL TUNNEL SYNDROME, LEFT 04/13/2008   CHF (congestive heart failure) (HCC)    Chronic back pain    Chronic neck pain    Complication of anesthesia    left lower jaw teeth have been injured in the past and he has lost those, now has 2 loose front teeth due to a mouth injury on 01/02/20   DVT (deep venous thrombosis) (HCC)    ERECTILE DYSFUNCTION, ORGANIC 02/20/2010   Gout, unspecified 02/20/2010   Headache(784.0)    migraines hx of- started at age 86- none since 45   History of melanoma    HYPERLIPIDEMIA, WITH HIGH HDL 10/18/2008   HYPERTENSION 05/09/2007   sees Dr. Benay Pillow   KNEE PAIN 10/10/2009   LOW BACK PAIN 03/27/2007   NEOPLASM, MALIGNANT, SKIN, TRUNK 05/09/2007   squamous cell on scalp, sideburn and ear (right). meyloma on truckl.   Patellar tendinitis 10/10/2009   Sciatic nerve pain    on Oxycodone as needed   SKIN CANCER, HX OF 03/27/2007   UNS ADVRS EFF UNS RX MEDICINAL&BIOLOGICAL SBSTNC 08/18/2007   Urinary urgency     Past Surgical History:  Procedure Laterality Date   APPENDECTOMY   3710   APPLICATION OF WOUND VAC Left 01/19/2020   Procedure: Application Of Wound Vac;  Surgeon: [name], MD;  Location: [place];  Service: Orthopedics;  Laterality: Left;   BLEPHAROPLASTY     CARPAL TUNNEL RELEASE Right    CHOLECYSTECTOMY     ESOPHAGOGASTRODUODENOSCOPY (EGD) WITH PROPOFOL N/A 04/26/2015   Procedure: ESOPHAGOGASTRODUODENOSCOPY (EGD) WITH PROPOFOL;  Surgeon: Inda Castle, MD;  Location: [facility];  Service: Endoscopy;  Laterality: N/A;   EYE SURGERY     bilateral cataracts removed and vitrectomies   JOINT REPLACEMENT Right    knee   KNEE ARTHROSCOPY     KYPHOPLASTY N/A 06/10/2020   Procedure: Thoracic Twelve, Lumbar One, Lumbar Two Kyphoplasty;  Surgeon: Ashok Pall, MD;  Location: [place];  Service: Neurosurgery;  Laterality: N/A;  3C/RM 21   KYPHOPLASTY N/A 09/07/2020   Procedure: Lumbar three KYPHOPLASTY;  Surgeon: Ashok Pall, MD;  Location: [place];  Service: Neurosurgery;  Laterality: N/A;   LAMINECTOMY     LATERAL FUSION LUMBAR SPINE, TRANSVERSE     LUMBAR LAMINECTOMY/DECOMPRESSION MICRODISCECTOMY Left 11/03/2012   Procedure: LUMBAR LAMINECTOMY/DECOMPRESSION MICRODISCECTOMY 1 LEVEL;  Surgeon: Hosie Spangle, MD;  Location: [place] [facility];  Service: Neurosurgery;  Laterality: Left;  LEFT L5S1 laminotomy foraminotomy and possible microdiskectomy   MELANOMA EXCISION     scalp  NASAL SINUS SURGERY     ORIF PATELLA Left 01/19/2020   Procedure: LEFT OPEN REDUCTION INTERNAL (ORIF) FIXATION PATELLA WITH AUTO GRAFTING USING HAMSTRING REINFORCEMENT;  Surgeon: [name], MD;  Location: [place];  Service: Orthopedics;  Laterality: Left;   POSTERIOR CERVICAL FUSION/FORAMINOTOMY Right 10/01/2012   Procedure: POSTERIOR CERVICAL FUSION/FORAMINOTOMY LEVEL 1;  Surgeon: Hosie Spangle, MD;  Location: [place] [facility];  Service: Neurosurgery;  Laterality: Right;  Right Cervical seven thoracic one Cervical laminectomy/foraminotomy with posterior cervical arthrodesis     POSTERIOR LUMBAR FUSION N/A 05/23/2013   Procedure: exploration of lumbar wound, explantation of left interbody implant.;  Surgeon: Hosie Spangle, MD;  Location: [place];  Service: Neurosurgery;  Laterality: N/A;   RECTAL SURGERY     Fissure   TOTAL KNEE ARTHROPLASTY Left 05/07/2019   Procedure: LEFT TOTAL KNEE ARTHROPLASTY;  Surgeon: [name], MD;  Location: [place];  Service: Orthopedics;  Laterality: Left;   VITRECTOMY     right and left 2013    Family History  Problem Relation Age of Onset   Dementia Mother    Anemia Father    Heart disease Father    Stroke Sister        ICH   Lung disease Sister     Social History   Socioeconomic History   Marital status: Married    Spouse name: Not on file   Number of children: 3   Years of education: 12   Highest education level: Not on file  Occupational History   Occupation: Retired  Tobacco Use   Smoking status: Former    Packs/day: 1.00    Years: 33.00    Total pack years: 33.00    Types: Cigarettes    Quit date: 09/29/1981    Years since quitting: 40.8   Smokeless tobacco: Never  Vaping Use   Vaping Use: Never used  Substance and Sexual Activity   Alcohol use: Not Currently    Comment: rare   Drug use: No   Sexual activity: Yes    Partners: Female    Comment: Married  Other Topics Concern   Not on file  Social History Narrative   Lives at home w/ his wife   Left-handed   Caffeine: 1 cup coffee daily   1 son alive, 2 sons deceased   Retired Building control surveyor, Furniture conservator/restorer.    Education: GED   Social Determinants of Health   Financial Resource Strain: [place]  (11/20/2021)   Overall Financial Resource Strain (CARDIA)    Difficulty of Paying Living Expenses: Not hard at all  Food Insecurity: No Food Insecurity (11/20/2021)   Hunger Vital Sign    Worried About Running Out of Food in the Last Year: Never true    [place] in the Last Year: Never true  Transportation Needs: No Transportation Needs (11/20/2021)    PRAPARE - Hydrologist (Medical): No    Lack of Transportation (Non-Medical): No  Physical Activity: Inactive (11/20/2021)   Exercise Vital Sign    Days of Exercise per Week: 0 days    Minutes of Exercise per Session: 0 min  Stress: No Stress Concern Present (11/20/2021)   [place]    Feeling of Stress : Not at all  Social Connections: Moderately Isolated (11/20/2021)   Social Connection and Isolation Panel [NHANES]    Frequency of Communication with Friends and Family: Twice a week  Frequency of Social Gatherings with Friends and Family: Twice a week    Attends Religious Services: Never    Marine scientist or Organizations: No    Attends Archivist Meetings: Never    Marital Status: Married  Human resources officer Violence: Not At Risk (11/20/2021)   Humiliation, Afraid, Rape, and Kick questionnaire    Fear of Current or Ex-Partner: No    Emotionally Abused: No    Physically Abused: No    Sexually Abused: No     Physical Exam   Vitals:   07/15/22 1832 07/15/22 1928  BP: (!) 202/134 (!) 197/161  Pulse: 81 95  Resp: 14 18  Temp: 98.2 F (36.8 C) 98 F (36.7 C)  SpO2: 98% 97%    CONSTITUTIONAL: Well-appearing, NAD NEURO/PSYCH:  Alert and oriented x 3, no focal deficits EYES:  eyes equal and reactive ENT/NECK:  no LAD, no JVD CARDIO: Regular rate, well-perfused, normal S1 and S2 PULM:  CTAB no wheezing or rhonchi GI/GU:  non-distended, non-tender MSK/SPINE:  No gross deformities, no edema SKIN: Small circular wound to the right forearm with active bleeding   *Additional and/or pertinent findings included in MDM below  Diagnostic and Interventional Summary    EKG Interpretation  Date/Time:    Ventricular Rate:    PR Interval:    QRS Duration:   QT Interval:    QTC Calculation:   R Axis:     Text Interpretation:         Labs Reviewed - No data to  display  No orders to display    Medications  lidocaine (PF) (XYLOCAINE) 1 % injection 30 mL (30 mLs Infiltration Given 07/16/22 0217)     Procedures  /  Critical Care .Marland KitchenLaceration Repair  Date/Time: 07/16/2022 2:37 AM  Performed by: Maudie Flakes, MD Authorized by: Maudie Flakes, MD   Consent:    Consent obtained:  Verbal   Consent given by:  Patient   Risks, benefits, and alternatives were discussed: yes     Risks discussed:  Infection, need for additional repair, nerve damage, poor wound healing, poor cosmetic result, pain, retained foreign body, tendon damage and vascular damage   Alternatives discussed:  No treatment Universal protocol:    Procedure explained and questions answered to patient or proxy's satisfaction: yes     Immediately prior to procedure, a time out was called: yes     Patient identity confirmed:  Verbally with patient Anesthesia:    Anesthesia method:  Local infiltration   Local anesthetic:  Lidocaine 1% w/o epi Laceration details:    Location:  Shoulder/arm   Shoulder/arm location:  R lower arm   Length (cm):  1   Depth (mm):  3 Pre-procedure details:    Preparation:  Patient was prepped and draped in usual sterile fashion Exploration:    Limited defect created (wound extended): no     Hemostasis achieved with:  Tied off vessels   Wound exploration: wound explored through full range of motion and entire depth of wound visualized     Contaminated: no   Treatment:    Area cleansed with:  Saline   Amount of cleaning:  Standard   Debridement:  Minimal   Undermining:  Minimal Skin repair:    Repair method:  Sutures and tissue adhesive   Suture size:  4-0   Suture material:  Prolene   Suture technique:  Simple interrupted and figure eight   Number of sutures:  4 Approximation:  Approximation:  Close Repair type:    Repair type:  Intermediate Post-procedure details:    Dressing:  Bulky dressing   Procedure completion:  Tolerated well, no  immediate complications Comments:     Bleeding wound, during initial evaluation and overlying blood clot was removed.  With the blood clot, the recent stitches came out with it.  Seems to be a small pulsatile bleeding vessel within the wound.  This area of tissue was cinched with a figure-of-eight suture.  Much better bleeding control after this.  Still some venous oozing afterwards.  The rest of the wound was closed with simple interrupted sutures.  The wound then needed 10 or 20 minutes of a pressure dressing to achieve hemostasis.  After which, the wound was then covered in Dermabond to ensure full closure and further remove risk of rebleeding.  All of these techniques were discussed with and agreed upon by patient.   ED Course and Medical Decision Making  Initial Impression and Ddx Bleeding from wound, anticoagulated, see procedural details above.  Past medical/surgical history that increases complexity of ED encounter: Anticoagulated  Interpretation of Diagnostics Laboratory and/or imaging options to aid in the diagnosis/care of the patient were considered.  After careful history and physical examination, it was determined that there was no indication for diagnostics at this time.  Patient Reassessment and Ultimate Disposition/Management     Patient is hemostatic and is now appropriate for discharge.  Patient management required discussion with the following services or consulting groups:  None  Complexity of Problems Addressed Acute illness or injury that poses threat of life of bodily function  Additional Data Reviewed and Analyzed Further history obtained from: Past medical history and medications listed in the EMR, Prior ED visit notes, and Prior labs/imaging results  Additional Factors Impacting ED Encounter Risk Minor Procedures  Barth Kirks. [place]onia Small, [place] [email or web address]  Final Clinical Impressions(s) / ED  Diagnoses     ICD-10-CM   1. Visit for wound check  Z51.89     2. Bleeding from wound  T14.8XXA     3. Anticoagulated  Z79.01       ED Discharge Orders     None        Discharge Instructions Discussed with and Provided to Patient:    Discharge Instructions      You were evaluated in the Emergency Department and after careful evaluation, we did not find any emergent condition requiring admission or further testing in the hospital.  Your exam/testing today was overall reassuring.  We had to put note stitches and glue on your wound to close it and stopped bleeding.  The stitches will need to be removed in 2 weeks by healthcare professional.  Please return to the Emergency Department if you experience any worsening of your condition.  Thank you for allowing [place] to be a part of your care.       Maudie Flakes, MD 07/16/22 [phone]

## 2022-07-16 NOTE — ED Notes (Signed)
Patient verbalizes understanding of discharge instructions. Opportunity for questioning and answers were provided. Armband removed by staff, pt discharged from ED. Pt taken to ED entrance via wheel chair.  

## 2022-07-16 NOTE — Discharge Instructions (Signed)
You were evaluated in the Emergency Department and after careful evaluation, we did not find any emergent condition requiring admission or further testing in the hospital.  Your exam/testing today was overall reassuring.  We had to put note stitches and glue on your wound to close it and stopped bleeding.  The stitches will need to be removed in 2 weeks by healthcare professional.  Please return to the Emergency Department if you experience any worsening of your condition.  Thank you for allowing [place] to be a part of your care.

## 2022-07-16 NOTE — Telephone Encounter (Signed)
Transition Care Management Unsuccessful Follow-up Telephone Call  Date of discharge and from where:  07/15/2022  Attempts:  1st Attempt  Reason for unsuccessful TCM follow-up call:  Unable to leave message

## 2022-07-17 ENCOUNTER — Telehealth: Payer: Self-pay

## 2022-07-17 NOTE — Telephone Encounter (Signed)
Transition Care Management Follow-up Telephone Call Date of discharge and from where: [facility] ED Dx: Laceration/Bleeding from Wound How have you been since you were released from the hospital? " Feeling okay; the area is still swollen and painful" Any questions or concerns? No  Items Reviewed: Did the pt receive and understand the discharge instructions provided? Yes Medications obtained and verified? Yes  Other? No  Any new allergies since your discharge? No  Dietary orders reviewed? No Do you have support at home? Yes ; wife  Home Care and Equipment/Supplies: Were home health services ordered? no If so, what is the name of the agency? no  Has the agency set up a time to come to the patient's home? no Were any new equipment or medical supplies ordered?  No What is the name of the medical supply agency? no Were you able to get the supplies/equipment? not applicable Do you have any questions related to the use of the equipment or supplies? No  Functional Questionnaire: (I = Independent and D = Dependent) ADLs: I  Bathing/Dressing- I  Meal Prep- I  Eating- I  Maintaining continence- I  Transferring/Ambulation- I  Managing Meds- I  Follow up appointments reviewed:  PCP Hospital f/u appt confirmed? Yes  Scheduled to see Pricilla Holm, MD on 07/19/2022 @ 9:20 am. [facility] f/u appt confirmed? No   Are transportation arrangements needed? No  If their condition worsens, is the pt aware to call PCP or go to the Emergency Dept.? Yes Was the patient provided with contact information for the PCP's office or ED? Yes Was to pt encouraged to call back with questions or concerns? Yes

## 2022-07-18 ENCOUNTER — Ambulatory Visit: Payer: Medicare Other | Admitting: Internal Medicine

## 2022-07-18 ENCOUNTER — Encounter: Payer: Self-pay | Admitting: Internal Medicine

## 2022-07-18 VITALS — BP 138/80 | HR 95 | Temp 98.7°F

## 2022-07-18 DIAGNOSIS — I10 Essential (primary) hypertension: Secondary | ICD-10-CM

## 2022-07-18 DIAGNOSIS — R600 Localized edema: Secondary | ICD-10-CM

## 2022-07-18 DIAGNOSIS — I5032 Chronic diastolic (congestive) heart failure: Secondary | ICD-10-CM | POA: Diagnosis not present

## 2022-07-18 DIAGNOSIS — M47817 Spondylosis without myelopathy or radiculopathy, lumbosacral region: Secondary | ICD-10-CM

## 2022-07-18 DIAGNOSIS — M79601 Pain in right arm: Secondary | ICD-10-CM | POA: Diagnosis not present

## 2022-07-18 MED ORDER — CEPHALEXIN 500 MG PO CAPS: 500.0000 mg | ORAL_CAPSULE | Freq: Two times a day (BID) | ORAL | 0 refills | Status: AC

## 2022-07-18 MED ORDER — LOSARTAN POTASSIUM 50 MG PO TABS: 50.0000 mg | ORAL_TABLET | Freq: Every day | ORAL | 3 refills | Status: DC

## 2022-07-18 MED ORDER — FUROSEMIDE 20 MG PO TABS: 20.0000 mg | ORAL_TABLET | Freq: Every day | ORAL | 3 refills | Status: DC

## 2022-07-18 MED ORDER — METOPROLOL SUCCINATE ER 50 MG PO TB24: 50.0000 mg | ORAL_TABLET | Freq: Every day | ORAL | 3 refills | Status: DC

## 2022-07-18 NOTE — Patient Instructions (Signed)
We have sent in keflex to take 1 pill twice a day for 5 days to prevent infection in the arm.  We have sent in the refills on the medicines.

## 2022-07-19 ENCOUNTER — Ambulatory Visit: Payer: Medicare Other | Admitting: Internal Medicine

## 2022-07-19 ENCOUNTER — Encounter: Payer: Self-pay | Admitting: Internal Medicine

## 2022-07-19 NOTE — Progress Notes (Signed)
   Subjective:   Patient ID: [name], male    DOB: 1932-04-15, 88 y.o.   MRN: [id number]  HPI The patient is an 88 YO man coming in for ER follow up. Wound on right forearm with stitches still painful and red and swollen which is increasing.   Review of Systems  Constitutional:  Positive for activity change. Negative for fever.  HENT: Negative.    Eyes: Negative.   Respiratory:  Negative for cough, chest tightness and shortness of breath.   Cardiovascular:  Negative for chest pain, palpitations and leg swelling.  Gastrointestinal:  Negative for abdominal distention, abdominal pain, constipation, diarrhea, nausea and vomiting.  Musculoskeletal:  Positive for arthralgias and myalgias.  Skin:  Positive for color change, rash and wound.  Neurological: Negative.   Psychiatric/Behavioral: Negative.      Objective:  Physical Exam Constitutional:      Appearance: He is well-developed.  HENT:     Head: Normocephalic and atraumatic.  Cardiovascular:     Rate and Rhythm: Normal rate and regular rhythm.  Pulmonary:     Effort: Pulmonary effort is normal. No respiratory distress.     Breath sounds: Normal breath sounds. No wheezing or rales.  Abdominal:     General: Bowel sounds are normal. There is no distension.     Palpations: Abdomen is soft.     Tenderness: There is no abdominal tenderness. There is no rebound.  Musculoskeletal:        General: Tenderness present.     Cervical back: Normal range of motion.     Comments: Right forearm with redness and swelling from wrist to elbow, examined wound with stitches intact and bandages with blood soaking them, no clear bleeding on exam mild oozing with palpation, extremely tender to palpation  Skin:    General: Skin is warm and dry.  Neurological:     Mental Status: He is alert and oriented to person, place, and time.     Coordination: Coordination normal.     Vitals:   07/18/22 1055  BP: 138/80  Pulse: 95  Temp: 98.7 F  (37.1 C)  TempSrc: Oral  SpO2: 96%    Assessment & Plan:  Visit time 20 minutes in face to face communication with patient and coordination of care, additional 10 minutes spent in record review, coordination or care, ordering tests, communicating/referring to other healthcare professionals, documenting in medical records all on the same day of the visit for total time 30 minutes spent on the visit.

## 2022-07-19 NOTE — Assessment & Plan Note (Addendum)
Suspect early cellulitis on the right forearm from skin breakdown from wound. Wound itself is not infected appearing. Given he is on eliquis the continued oozing is normal. Antibiotic ointment applied, then bandage, then mild compression ace wrap at visit. Change every 2 days or if bandage soaked. Rx keflex 5 day course and he will call back or return if not improving or worsening.

## 2022-07-19 NOTE — Assessment & Plan Note (Signed)
Overall stable on current pain regimen. Will continue seeing pain management for dosing.

## 2022-07-23 ENCOUNTER — Inpatient Hospital Stay (HOSPITAL_COMMUNITY)
Admission: EM | Admit: 2022-07-23 | Discharge: 2022-07-28 | DRG: 286 | Disposition: A | Discharge status: Home / Self Care | Payer: Medicare Other | Attending: Family Medicine | Admitting: Family Medicine

## 2022-07-23 ENCOUNTER — Other Ambulatory Visit: Payer: Self-pay

## 2022-07-23 ENCOUNTER — Encounter (HOSPITAL_COMMUNITY): Payer: Self-pay

## 2022-07-23 DIAGNOSIS — I890 Lymphedema, not elsewhere classified: Secondary | ICD-10-CM | POA: Diagnosis present

## 2022-07-23 DIAGNOSIS — M109 Gout, unspecified: Secondary | ICD-10-CM | POA: Diagnosis present

## 2022-07-23 DIAGNOSIS — Z7901 Long term (current) use of anticoagulants: Secondary | ICD-10-CM

## 2022-07-23 DIAGNOSIS — I5023 Acute on chronic systolic (congestive) heart failure: Secondary | ICD-10-CM | POA: Diagnosis present

## 2022-07-23 DIAGNOSIS — Z8249 Family history of ischemic heart disease and other diseases of the circulatory system: Secondary | ICD-10-CM

## 2022-07-23 DIAGNOSIS — I509 Heart failure, unspecified: Secondary | ICD-10-CM

## 2022-07-23 DIAGNOSIS — Z86711 Personal history of pulmonary embolism: Secondary | ICD-10-CM | POA: Diagnosis present

## 2022-07-23 DIAGNOSIS — I2699 Other pulmonary embolism without acute cor pulmonale: Secondary | ICD-10-CM | POA: Diagnosis present

## 2022-07-23 DIAGNOSIS — Z86718 Personal history of other venous thrombosis and embolism: Secondary | ICD-10-CM

## 2022-07-23 DIAGNOSIS — Z66 Do not resuscitate: Secondary | ICD-10-CM | POA: Diagnosis present

## 2022-07-23 DIAGNOSIS — I130 Hypertensive heart and chronic kidney disease with heart failure and stage 1 through stage 4 chronic kidney disease, or unspecified chronic kidney disease: Secondary | ICD-10-CM | POA: Diagnosis not present

## 2022-07-23 DIAGNOSIS — T40605A Adverse effect of unspecified narcotics, initial encounter: Secondary | ICD-10-CM | POA: Diagnosis present

## 2022-07-23 DIAGNOSIS — E785 Hyperlipidemia, unspecified: Secondary | ICD-10-CM | POA: Diagnosis present

## 2022-07-23 DIAGNOSIS — N400 Enlarged prostate without lower urinary tract symptoms: Secondary | ICD-10-CM | POA: Diagnosis present

## 2022-07-23 DIAGNOSIS — Z823 Family history of stroke: Secondary | ICD-10-CM

## 2022-07-23 DIAGNOSIS — Z87891 Personal history of nicotine dependence: Secondary | ICD-10-CM

## 2022-07-23 DIAGNOSIS — Z981 Arthrodesis status: Secondary | ICD-10-CM

## 2022-07-23 DIAGNOSIS — N182 Chronic kidney disease, stage 2 (mild): Secondary | ICD-10-CM | POA: Diagnosis present

## 2022-07-23 DIAGNOSIS — G8929 Other chronic pain: Secondary | ICD-10-CM | POA: Diagnosis present

## 2022-07-23 DIAGNOSIS — R079 Chest pain, unspecified: Secondary | ICD-10-CM | POA: Insufficient documentation

## 2022-07-23 DIAGNOSIS — Z85820 Personal history of malignant melanoma of skin: Secondary | ICD-10-CM

## 2022-07-23 DIAGNOSIS — Z79899 Other long term (current) drug therapy: Secondary | ICD-10-CM

## 2022-07-23 DIAGNOSIS — I25119 Atherosclerotic heart disease of native coronary artery with unspecified angina pectoris: Secondary | ICD-10-CM | POA: Diagnosis present

## 2022-07-23 DIAGNOSIS — I160 Hypertensive urgency: Secondary | ICD-10-CM | POA: Diagnosis present

## 2022-07-23 DIAGNOSIS — I5022 Chronic systolic (congestive) heart failure: Secondary | ICD-10-CM | POA: Diagnosis present

## 2022-07-23 DIAGNOSIS — I169 Hypertensive crisis, unspecified: Secondary | ICD-10-CM | POA: Diagnosis present

## 2022-07-23 DIAGNOSIS — I428 Other cardiomyopathies: Secondary | ICD-10-CM | POA: Diagnosis present

## 2022-07-23 DIAGNOSIS — Z96652 Presence of left artificial knee joint: Secondary | ICD-10-CM | POA: Diagnosis present

## 2022-07-23 DIAGNOSIS — K5903 Drug induced constipation: Secondary | ICD-10-CM | POA: Diagnosis present

## 2022-07-23 DIAGNOSIS — Z888 Allergy status to other drugs, medicaments and biological substances status: Secondary | ICD-10-CM

## 2022-07-23 DIAGNOSIS — Z85828 Personal history of other malignant neoplasm of skin: Secondary | ICD-10-CM

## 2022-07-23 DIAGNOSIS — Z7982 Long term (current) use of aspirin: Secondary | ICD-10-CM

## 2022-07-23 LAB — CBC
HCT: 37.4 % — ABNORMAL LOW (ref 39.0–52.0)
Hemoglobin: 12 g/dL — ABNORMAL LOW (ref 13.0–17.0)
MCH: 29.3 pg (ref 26.0–34.0)
MCHC: 32.1 g/dL (ref 30.0–36.0)
MCV: 91.2 fL (ref 80.0–100.0)
Platelets: 412 10*3/uL — ABNORMAL HIGH (ref 150–400)
RBC: 4.1 MIL/uL — ABNORMAL LOW (ref 4.22–5.81)
RDW: 14.1 % (ref 11.5–15.5)
WBC: 7.9 10*3/uL (ref 4.0–10.5)
nRBC: 0 % (ref 0.0–0.2)

## 2022-07-23 LAB — COMPREHENSIVE METABOLIC PANEL
ALT: 14 U/L (ref 0–44)
AST: 21 U/L (ref 15–41)
Albumin: 3.1 g/dL — ABNORMAL LOW (ref 3.5–5.0)
Alkaline Phosphatase: 92 U/L (ref 38–126)
Anion gap: 9 (ref 5–15)
BUN: 15 mg/dL (ref 8–23)
CO2: 19 mmol/L — ABNORMAL LOW (ref 22–32)
Calcium: 8.8 mg/dL — ABNORMAL LOW (ref 8.9–10.3)
Chloride: 107 mmol/L (ref 98–111)
Creatinine, Ser: 1.15 mg/dL (ref 0.61–1.24)
GFR, Estimated: >60 mL/min (ref >60)
Glucose, Bld: 106 mg/dL — ABNORMAL HIGH (ref 70–99)
Potassium: 4.4 mmol/L (ref 3.5–5.1)
Sodium: 135 mmol/L (ref 135–145)
Total Bilirubin: 0.3 mg/dL (ref 0.3–1.2)
Total Protein: 6.2 g/dL — ABNORMAL LOW (ref 6.5–8.1)

## 2022-07-23 LAB — TROPONIN I (HIGH SENSITIVITY): Troponin I (High Sensitivity): 12 ng/L (ref <18)

## 2022-07-23 MED ORDER — NITROGLYCERIN 0.4 MG SL SUBL
0.4000 mg | SUBLINGUAL_TABLET | Freq: Once | SUBLINGUAL | Status: AC
  Administered 2022-07-23: 0.4 mg via SUBLINGUAL
  Filled 2022-07-23: qty 1

## 2022-07-23 NOTE — ED Triage Notes (Signed)
Patient arrives to ED POV c/o Chest Heaviness, SHOB, and HTN. States he runs out of air and starts to gasp since 6pm today. Hx of blood clots and is on Eliquis. Pt a/o x4 no other complaints at this time.

## 2022-07-23 NOTE — ED Provider Triage Note (Addendum)
Emergency Medicine Provider Triage Evaluation Note  CHRISTIE COPLEY , a 88 y.o. male  was evaluated in triage.  Pt complains of increased chest heaviness, sob for last 3 hours--starting at 5PM. States he has known PE and is compliant with eliquis. Denies any radiation of pain or neuro symptoms.  BP 191/115  Review of Systems  Positive: Chest pain, SOB Negative: fever  Physical Exam  Ht 6' (1.829 m)   Wt 88.5 kg   BMI 26.45 kg/m  Gen:   Awake, no distress   Resp:  Normal effort  MSK:   Moves extremities without difficulty  Other:  2+ pitting BLE edema  Medical Decision Making  Medically screening exam initiated at 8:12 PM.  Appropriate orders placed.  KEENE GILKEY was informed that the remainder of the evaluation will be completed by another provider, this initial triage assessment does not replace that evaluation, and the importance of remaining in the ED until their evaluation is complete.   Osvaldo Shipper, PA 07/23/22 2011    Osvaldo Shipper, [place] 07/23/22 2012

## 2022-07-24 ENCOUNTER — Emergency Department (HOSPITAL_COMMUNITY): Payer: Medicare Other

## 2022-07-24 DIAGNOSIS — I5023 Acute on chronic systolic (congestive) heart failure: Secondary | ICD-10-CM

## 2022-07-24 DIAGNOSIS — I509 Heart failure, unspecified: Secondary | ICD-10-CM | POA: Insufficient documentation

## 2022-07-24 LAB — LIPID PANEL
Cholesterol: 169 mg/dL (ref 0–200)
HDL: 69 mg/dL (ref >40)
LDL Cholesterol: 88 mg/dL (ref 0–99)
Total CHOL/HDL Ratio: 2.4 RATIO
Triglycerides: 60 mg/dL (ref <150)
VLDL: 12 mg/dL (ref 0–40)

## 2022-07-24 LAB — TROPONIN I (HIGH SENSITIVITY): Troponin I (High Sensitivity): 14 ng/L (ref <18)

## 2022-07-24 LAB — BRAIN NATRIURETIC PEPTIDE: B Natriuretic Peptide: 470.3 pg/mL — ABNORMAL HIGH (ref 0.0–100.0)

## 2022-07-24 MED ORDER — IOHEXOL 350 MG/ML SOLN
75.0000 mL | Freq: Once | INTRAVENOUS | Status: AC | PRN
  Administered 2022-07-24: 75 mL via INTRAVENOUS

## 2022-07-24 MED ORDER — METOPROLOL SUCCINATE ER 25 MG PO TB24: 50.0000 mg | ORAL_TABLET | Freq: Every day | ORAL | Status: DC

## 2022-07-24 MED ORDER — APIXABAN 5 MG PO TABS
5.0000 mg | ORAL_TABLET | Freq: Two times a day (BID) | ORAL | Status: DC
  Administered 2022-07-24 – 2022-07-25 (×3): 5 mg via ORAL
  Filled 2022-07-24 (×3): qty 1

## 2022-07-24 MED ORDER — ACETAMINOPHEN 325 MG PO TABS
650.0000 mg | ORAL_TABLET | ORAL | Status: DC | PRN
  Administered 2022-07-27: 650 mg via ORAL
  Filled 2022-07-24: qty 2

## 2022-07-24 MED ORDER — OXYCODONE HCL 5 MG PO TABS
10.0000 mg | ORAL_TABLET | Freq: Three times a day (TID) | ORAL | Status: DC | PRN
  Administered 2022-07-24 – 2022-07-28 (×12): 20 mg via ORAL
  Filled 2022-07-24 (×12): qty 4

## 2022-07-24 MED ORDER — FENTANYL CITRATE PF 50 MCG/ML IJ SOSY
50.0000 ug | PREFILLED_SYRINGE | Freq: Once | INTRAMUSCULAR | Status: AC
  Administered 2022-07-24: 50 ug via INTRAVENOUS
  Filled 2022-07-24: qty 1

## 2022-07-24 MED ORDER — TAMSULOSIN HCL 0.4 MG PO CAPS
0.4000 mg | ORAL_CAPSULE | Freq: Every day | ORAL | Status: DC
  Administered 2022-07-24 – 2022-07-28 (×5): 0.4 mg via ORAL
  Filled 2022-07-24 (×5): qty 1

## 2022-07-24 MED ORDER — ONDANSETRON HCL 4 MG/2ML IJ SOLN: 4.0000 mg | Freq: Four times a day (QID) | INTRAMUSCULAR | Status: DC | PRN

## 2022-07-24 MED ORDER — SODIUM CHLORIDE 0.9 % IV SOLN: 250.0000 mL | INTRAVENOUS | Status: DC | PRN

## 2022-07-24 MED ORDER — LOSARTAN POTASSIUM 50 MG PO TABS
50.0000 mg | ORAL_TABLET | Freq: Every day | ORAL | Status: DC
  Administered 2022-07-24 – 2022-07-25 (×2): 50 mg via ORAL
  Filled 2022-07-24 (×2): qty 1

## 2022-07-24 MED ORDER — HYDROMORPHONE HCL 1 MG/ML IJ SOLN: 1.0000 mg | Freq: Once | INTRAMUSCULAR | Status: DC

## 2022-07-24 MED ORDER — NALDEMEDINE TOSYLATE 0.2 MG PO TABS
0.2000 mg | ORAL_TABLET | Freq: Every morning | ORAL | Status: DC
  Administered 2022-07-26 – 2022-07-28 (×2): 0.2 mg via ORAL
  Filled 2022-07-24 (×3): qty 1

## 2022-07-24 MED ORDER — ISOSORBIDE MONONITRATE ER 30 MG PO TB24: 15.0000 mg | ORAL_TABLET | Freq: Every day | ORAL | Status: DC

## 2022-07-24 MED ORDER — FLUTICASONE PROPIONATE 50 MCG/ACT NA SUSP
1.0000 | Freq: Every morning | NASAL | Status: DC
  Administered 2022-07-24 – 2022-07-28 (×5): 1 via NASAL
  Filled 2022-07-24: qty 16

## 2022-07-24 MED ORDER — FUROSEMIDE 20 MG PO TABS
20.0000 mg | ORAL_TABLET | Freq: Every day | ORAL | Status: DC
  Administered 2022-07-24 – 2022-07-25 (×2): 20 mg via ORAL
  Filled 2022-07-24 (×2): qty 1

## 2022-07-24 MED ORDER — CARVEDILOL 3.125 MG PO TABS
3.1250 mg | ORAL_TABLET | Freq: Two times a day (BID) | ORAL | Status: DC
  Administered 2022-07-24 – 2022-07-25 (×3): 3.125 mg via ORAL
  Filled 2022-07-24 (×3): qty 1

## 2022-07-24 MED ORDER — HYDRALAZINE HCL 25 MG PO TABS
25.0000 mg | ORAL_TABLET | Freq: Four times a day (QID) | ORAL | Status: DC | PRN
  Administered 2022-07-25: 25 mg via ORAL
  Filled 2022-07-24: qty 1

## 2022-07-24 MED ORDER — CLOTRIMAZOLE 1 % EX CREA: 1.0000 | TOPICAL_CREAM | Freq: Two times a day (BID) | CUTANEOUS | Status: DC | PRN

## 2022-07-24 MED ORDER — HYDRALAZINE HCL 20 MG/ML IJ SOLN
20.0000 mg | Freq: Once | INTRAMUSCULAR | Status: AC
  Administered 2022-07-24: 20 mg via INTRAVENOUS
  Filled 2022-07-24: qty 1

## 2022-07-24 MED ORDER — ASPIRIN 81 MG PO TBEC
81.0000 mg | DELAYED_RELEASE_TABLET | Freq: Every day | ORAL | Status: DC
  Administered 2022-07-25 – 2022-07-28 (×4): 81 mg via ORAL
  Filled 2022-07-24 (×6): qty 1

## 2022-07-24 MED ORDER — ASPIRIN 81 MG PO CHEW
324.0000 mg | CHEWABLE_TABLET | Freq: Once | ORAL | Status: AC
  Administered 2022-07-24: 324 mg via ORAL
  Filled 2022-07-24: qty 4

## 2022-07-24 MED ORDER — SODIUM CHLORIDE 0.9% FLUSH: 3.0000 mL | INTRAVENOUS | Status: DC | PRN

## 2022-07-24 MED ORDER — SODIUM CHLORIDE 0.9% FLUSH
3.0000 mL | Freq: Two times a day (BID) | INTRAVENOUS | Status: DC
  Administered 2022-07-24 – 2022-07-28 (×7): 3 mL via INTRAVENOUS

## 2022-07-24 MED ORDER — ISOSORBIDE MONONITRATE ER 30 MG PO TB24
30.0000 mg | ORAL_TABLET | Freq: Every day | ORAL | Status: DC
  Administered 2022-07-24 – 2022-07-28 (×5): 30 mg via ORAL
  Filled 2022-07-24 (×5): qty 1

## 2022-07-24 NOTE — ED Notes (Signed)
Pt ambulated to rest room with walker without difficulty. Pt eating breakfast tray. Pt denies any needs.

## 2022-07-24 NOTE — H&P (Addendum)
History and Physical    KYRIAN STAGE DJM:[id number] DOB: 1931-09-09 DOA: 07/23/2022  PCP: Hoyt Koch, MD (Confirm with patient/family/NH records and if not entered, this has to be entered at [facility] point of entry) Patient coming from: Home  I have personally briefly reviewed patient's old medical records in [place]  Chief Complaint: SOB, chest pain  HPI: VIYAAN CHAMPINE is a 88 y.o. male with medical history significant of recently diagnosed acute PE on Eliquis November 2023, CAD, BPH, chronic HFrEF with LVEF=40% November 2023, HTN, HLD, chronic back pain on opioid, presented with sudden onset of shortness of breath and chest pain.  Symptoms started yesterday afternoon, patient was at home then started to develop increasing shortness of breath at rest, "could not lie flat because of shortness of breath " and sharp-like chest pain radiating to the back, localized on the left lower rib cage, denies any palpitations sweating nauseous vomiting.  Chest pain has been intermittent with no significant exacerbation or relieving factors.  ED Course: Blood pressure significant elevated with SBP> 200, troponin negative x 2.  CT angiogram negative for dissection or PE.  Patient was given BP meds in the ED and blood pressure improved as well as the feeling of shortness of breath and chest pain.  Review of Systems: As per HPI otherwise 14 point review of systems negative.    Past Medical History:  Diagnosis Date   ACTINIC KERATOSIS, HEAD 03/23/2008   ALLERGIC RHINITIS 03/27/2007   Arthritis    BENIGN PROSTATIC HYPERTROPHY, WITH URINARY OBSTRUCTION 10/16/2007   CAD 08/18/2007   "patient denies any issues with heart, does not see cardiologist"   Carpal tunnel syndrome, bilateral 09/04/2016   CARPAL TUNNEL SYNDROME, LEFT 04/13/2008   CHF (congestive heart failure) (HCC)    Chronic back pain    Chronic neck pain    Complication of anesthesia    left lower jaw teeth have been injured in  the past and he has lost those, now has 2 loose front teeth due to a mouth injury on 01/02/20   DVT (deep venous thrombosis) (HCC)    ERECTILE DYSFUNCTION, ORGANIC 02/20/2010   Gout, unspecified 02/20/2010   Headache(784.0)    migraines hx of- started at age 46- none since 64   History of melanoma    HYPERLIPIDEMIA, WITH HIGH HDL 10/18/2008   HYPERTENSION 05/09/2007   sees Dr. Benay Pillow   KNEE PAIN 10/10/2009   LOW BACK PAIN 03/27/2007   NEOPLASM, MALIGNANT, SKIN, TRUNK 05/09/2007   squamous cell on scalp, sideburn and ear (right). meyloma on truckl.   Patellar tendinitis 10/10/2009   Sciatic nerve pain    on Oxycodone as needed   SKIN CANCER, HX OF 03/27/2007   UNS ADVRS EFF UNS RX MEDICINAL&BIOLOGICAL SBSTNC 08/18/2007   Urinary urgency     Past Surgical History:  Procedure Laterality Date   APPENDECTOMY  2229   APPLICATION OF WOUND VAC Left 01/19/2020   Procedure: Application Of Wound Vac;  Surgeon: [name], MD;  Location: [facility];  Service: Orthopedics;  Laterality: Left;   BLEPHAROPLASTY     CARPAL TUNNEL RELEASE Right    CHOLECYSTECTOMY     ESOPHAGOGASTRODUODENOSCOPY (EGD) WITH PROPOFOL N/A 04/26/2015   Procedure: ESOPHAGOGASTRODUODENOSCOPY (EGD) WITH PROPOFOL;  Surgeon: Inda Castle, MD;  Location: [facility];  Service: Endoscopy;  Laterality: N/A;   EYE SURGERY     bilateral cataracts removed and vitrectomies   JOINT REPLACEMENT Right    knee  KNEE ARTHROSCOPY     KYPHOPLASTY N/A 06/10/2020   Procedure: Thoracic Twelve, Lumbar One, Lumbar Two Kyphoplasty;  Surgeon: Ashok Pall, MD;  Location: [facility];  Service: Neurosurgery;  Laterality: N/A;  3C/RM 21   KYPHOPLASTY N/A 09/07/2020   Procedure: Lumbar three KYPHOPLASTY;  Surgeon: Ashok Pall, MD;  Location: [facility];  Service: Neurosurgery;  Laterality: N/A;   LAMINECTOMY     LATERAL FUSION LUMBAR SPINE, TRANSVERSE     LUMBAR LAMINECTOMY/DECOMPRESSION MICRODISCECTOMY Left 11/03/2012   Procedure: LUMBAR  LAMINECTOMY/DECOMPRESSION MICRODISCECTOMY 1 LEVEL;  Surgeon: Hosie Spangle, MD;  Location: [facility];  Service: Neurosurgery;  Laterality: Left;  LEFT L5S1 laminotomy foraminotomy and possible microdiskectomy   MELANOMA EXCISION     scalp   NASAL SINUS SURGERY     ORIF PATELLA Left 01/19/2020   Procedure: LEFT OPEN REDUCTION INTERNAL (ORIF) FIXATION PATELLA WITH AUTO GRAFTING USING HAMSTRING REINFORCEMENT;  Surgeon: [name], MD;  Location: [facility];  Service: Orthopedics;  Laterality: Left;   POSTERIOR CERVICAL FUSION/FORAMINOTOMY Right 10/01/2012   Procedure: POSTERIOR CERVICAL FUSION/FORAMINOTOMY LEVEL 1;  Surgeon: Hosie Spangle, MD;  Location: [facility];  Service: Neurosurgery;  Laterality: Right;  Right Cervical seven thoracic one Cervical laminectomy/foraminotomy with posterior cervical arthrodesis    POSTERIOR LUMBAR FUSION N/A 05/23/2013   Procedure: exploration of lumbar wound, explantation of left interbody implant.;  Surgeon: Hosie Spangle, MD;  Location: [facility];  Service: Neurosurgery;  Laterality: N/A;   RECTAL SURGERY     Fissure   TOTAL KNEE ARTHROPLASTY Left 05/07/2019   Procedure: LEFT TOTAL KNEE ARTHROPLASTY;  Surgeon: [name], MD;  Location: [facility];  Service: Orthopedics;  Laterality: Left;   VITRECTOMY     right and left 2013     reports that he quit smoking about 40 years ago. His smoking use included cigarettes. He has a 33.00 pack-year smoking history. He has never used smokeless tobacco. He reports that he does not currently use alcohol. He reports that he does not use drugs.  Allergies  Allergen Reactions   Amlodipine Swelling and Other (See Comments)    Ankle swelling   Lyrica [Pregabalin] Swelling and Other (See Comments)    Hands and feet swell   Gabapentin Other (See Comments)    Lower extremity edema- knees and feet    Family History  Problem Relation Age of Onset   Dementia Mother    Anemia Father    Heart disease Father     Stroke Sister        ICH   Lung disease Sister     Prior to Admission medications   Medication Sig Start Date End Date Taking? Authorizing Provider  apixaban (ELIQUIS) 5 MG TABS tablet Take 1 tablet (5 mg total) by mouth 2 (two) times daily. 07/03/22  Yes Hoyt Koch, MD  clotrimazole (LOTRIMIN) 1 % cream APPLY TO AFFECTED AREA TWICE A DAY Patient taking differently: Apply 1 Application topically 2 (two) times daily as needed (for dryness). 06/13/22  Yes Burns, Claudina Lick, MD  fluticasone (FLONASE) 50 MCG/ACT nasal spray Place 1 spray into both nostrils in the morning.   Yes [provider]  furosemide (LASIX) 20 MG tablet Take 1 tablet (20 mg total) by mouth daily. 07/18/22  Yes Hoyt Koch, MD  losartan (COZAAR) 50 MG tablet Take 1 tablet (50 mg total) by mouth daily. 07/18/22  Yes Hoyt Koch, MD  metoprolol succinate (TOPROL XL) 50 MG 24 hr tablet  Take 1 tablet (50 mg total) by mouth daily. 07/18/22  Yes Hoyt Koch, MD  Oxycodone HCl 10 MG TABS Take 10-20 mg by mouth See admin instructions. Take 20 mg by mouth in the morning and evening. May take an additional 10 mg once a day around noontime as needed for unresolved pain.   Yes [provider]  SYMPROIC 0.2 MG TABS Take 0.2 mg by mouth in the morning.   Yes [provider]  tamsulosin (FLOMAX) 0.4 MG CAPS capsule TAKE 1 CAPSULE BY MOUTH EVERY DAY Patient taking differently: Take 0.4 mg by mouth at bedtime. 02/20/22  Yes Hoyt Koch, MD  APIXABAN Arne [place]) VTE STARTER PACK ('10MG'$  AND '5MG'$ ) Take 2 tablets ('10mg'$ ) by mouth twice daily for 7 days, then 1 tablet ('5mg'$ ) twice daily 06/22/22   Patrecia Pour, MD  aspirin EC 81 MG tablet Take 1 tablet (81 mg total) by mouth daily. Swallow whole. 06/22/22   Patrecia Pour, MD    Physical Exam: Vitals:   07/24/22 0617 07/24/22 0735 07/24/22 0845 07/24/22 0854  BP: (!) 160/86 (!) 158/92 (!) 161/99 (!) 161/99  Pulse: 96 (!) 110 72  (!) 113  Resp: (!) '22 20 19   '$ Temp:  98.8 F (37.1 C)    TempSrc:  Oral    SpO2: 99% 99% 94%   Weight:      Height:        Constitutional: NAD, calm, comfortable Vitals:   07/24/22 0617 07/24/22 0735 07/24/22 0845 07/24/22 0854  BP: (!) 160/86 (!) 158/92 (!) 161/99 (!) 161/99  Pulse: 96 (!) 110 72 (!) 113  Resp: (!) '22 20 19   '$ Temp:  98.8 F (37.1 C)    TempSrc:  Oral    SpO2: 99% 99% 94%   Weight:      Height:       Eyes: PERRL, lids and conjunctivae normal ENMT: Mucous membranes are moist. Posterior pharynx clear of any exudate or lesions.Normal dentition.  Neck: normal, supple, no masses, no thyromegaly Respiratory: clear to auscultation bilaterally, no wheezing, no crackles. Normal respiratory effort. No accessory muscle use.  Cardiovascular: Regular rate and rhythm, no murmurs / rubs / gallops.  Chronic lymphedema. 2+ pedal pulses. No carotid bruits.  Abdomen: no tenderness, no masses palpated. No hepatosplenomegaly. Bowel sounds positive.  Musculoskeletal: no clubbing / cyanosis. No joint deformity upper and lower extremities. Good ROM, no contractures. Normal muscle tone.  Skin: no rashes, lesions, ulcers. No induration Neurologic: CN 2-12 grossly intact. Sensation intact, DTR normal. Strength 5/5 in all 4.  Psychiatric: Normal judgment and insight. Alert and oriented x 3. Normal mood.    Labs on Admission: I have personally reviewed following labs and imaging studies  CBC: Recent Labs  Lab 07/23/22 2038  WBC 7.9  HGB 12.0*  HCT 37.4*  MCV 91.2  PLT 676*   Basic Metabolic Panel: Recent Labs  Lab 07/23/22 2038  NA 135  K 4.4  CL 107  CO2 19*  GLUCOSE 106*  BUN 15  CREATININE 1.15  CALCIUM 8.8*   GFR: Estimated Creatinine Clearance: 48.7 mL/min (by C-G formula based on SCr of 1.15 mg/dL). Liver Function Tests: Recent Labs  Lab 07/23/22 2038  AST 21  ALT 14  ALKPHOS 92  BILITOT 0.3  PROT 6.2*  ALBUMIN 3.1*   No results for input(s):  "LIPASE", "AMYLASE" in the last 168 hours. No results for input(s): "AMMONIA" in the last 168 hours. Coagulation Profile: No results for  input(s): "INR", "PROTIME" in the last 168 hours. Cardiac Enzymes: No results for input(s): "CKTOTAL", "CKMB", "CKMBINDEX", "TROPONINI" in the last 168 hours. BNP (last 3 results) Recent Labs    01/03/22 1050  PROBNP 266.0*   HbA1C: No results for input(s): "HGBA1C" in the last 72 hours. CBG: No results for input(s): "GLUCAP" in the last 168 hours. Lipid Profile: No results for input(s): "CHOL", "HDL", "LDLCALC", "TRIG", "CHOLHDL", "LDLDIRECT" in the last 72 hours. Thyroid Function Tests: No results for input(s): "TSH", "T4TOTAL", "FREET4", "T3FREE", "THYROIDAB" in the last 72 hours. Anemia Panel: No results for input(s): "VITAMINB12", "FOLATE", "FERRITIN", "TIBC", "IRON", "RETICCTPCT" in the last 72 hours. Urine analysis:    Component Value Date/Time   COLORURINE YELLOW 01/11/2021 [place] 01/11/2021 1242   LABSPEC 1.008 01/11/2021 1242   PHURINE 6.0 01/11/2021 1242   GLUCOSEU NEGATIVE 01/11/2021 1242   GLUCOSEU NEGATIVE 10/03/2018 1036   HGBUR SMALL (A) 01/11/2021 1242   BILIRUBINUR NEGATIVE 01/11/2021 1242   BILIRUBINUR negative 10/17/2018 [place] 01/11/2021 1242   PROTEINUR NEGATIVE 01/11/2021 1242   UROBILINOGEN 0.2 10/17/2018 1555   UROBILINOGEN 0.2 10/03/2018 1036   NITRITE NEGATIVE 01/11/2021 1242   LEUKOCYTESUR NEGATIVE 01/11/2021 1242    Radiological Exams on Admission: CT Angio Chest Pulmonary Embolism (PE) W or WO Contrast  Result Date: 07/24/2022 CLINICAL DATA:  87 year old male with history of increasing chest heaviness and shortness of breath for the past 3 hours. EXAM: CT ANGIOGRAPHY CHEST WITH CONTRAST TECHNIQUE: Multidetector CT imaging of the chest was performed using the standard protocol during bolus administration of intravenous contrast. Multiplanar CT image reconstructions and  MIPs were obtained to evaluate the vascular anatomy. RADIATION DOSE REDUCTION: This exam was performed according to the departmental dose-optimization program which includes automated exposure control, adjustment of the mA and/or kV according to patient size and/or use of iterative reconstruction technique. CONTRAST:  41m OMNIPAQUE IOHEXOL 350 MG/ML SOLN COMPARISON:  Chest CTA 06/20/2022. FINDINGS: Cardiovascular: Previously noted filling defects in the right upper lobe pulmonary artery branches have resolved, indicative of successful treatment for prior pulmonary embolism. Today's study demonstrates no new filling defects within the pulmonary arterial tree to suggest new pulmonary embolism. Heart size is borderline enlarged. There is no significant pericardial fluid, thickening or pericardial calcification. There is aortic atherosclerosis, as well as atherosclerosis of the great vessels of the mediastinum and the coronary arteries, including calcified atherosclerotic plaque in the left main, left anterior descending, left circumflex and right coronary arteries. Mediastinum/Nodes: No pathologically enlarged mediastinal or hilar lymph nodes. Esophagus is unremarkable in appearance. No axillary lymphadenopathy. Lungs/Pleura: No acute consolidative airspace disease. No pleural effusions. No suspicious appearing pulmonary nodules or masses are noted. Upper Abdomen: 1.4 cm low-attenuation lesion in the central aspect of segment 4A of the liver, compatible with a simple cyst (no imaging follow-up recommended). Aortic atherosclerosis. Musculoskeletal: Chronic compression fractures of T3, T11, T12 and L1 are again noted, most severe at T12 where there is 70% loss of anterior vertebral body height. Post vertebroplasty changes at L1 partially imaged. There are no aggressive appearing lytic or blastic lesions noted in the visualized portions of the skeleton. Review of the MIP images confirms the above findings. IMPRESSION: 1.  No acute findings in the thorax to account for the patient's symptoms. Previously noted right upper lobe pulmonary emboli have resolved compared to prior study 06/20/2022. 2. Aortic atherosclerosis, in addition to left main and three-vessel coronary artery disease. 3. Incidental findings, as above, similar  to the prior study. Aortic Atherosclerosis (ICD10-I70.0). Electronically Signed   By: Vinnie Langton M.D.   On: 07/24/2022 05:34    EKG: Independently reviewed.  Sinus tachycardia, chronic ST-T changes on V1 and V2  Assessment/Plan Principal Problem:   CHF (congestive heart failure) (HCC) Active Problems:   Chronic systolic heart failure (HCC)  (please populate well all problems here in Problem List. (For example, if patient is on BP meds at home and you resume or decide to hold them, it is a problem that needs to be her. Same for CAD, COPD, HLD and so on)   HTN emergency -Uncontrolled hypertension likely the cause for anginal-like chest pain and CHF decompensation. -Symptoms of dyspnea as well as chest pain all improved after blood pressure improved with initial ED management. -Plan to adjust his blood pressure/CHF medications, change metoprolol to Coreg and titrate according to response, add small dose of Imdur and continue ARB. He is almost will continue home dose of Lasix 20 mg daily. -CTA negative for acute PE or dissection in the chest. -As per cardiologist note on last admission in November, plan is for patient to have outpatient cardiology workup.  Anginal-like chest pain -Improving, likely secondary to HTN emergency, management as above. -Start Imdur -CTA showed left main and three-vessel CAD, given his age and other comorbidities, likely prefer conservative management.  Will order lipid panel, continue aspirin and Eliquis.  Acute on chronic HFrEF decompensation -Likely secondary to HTN emergency, largely improved after blood pressure control -Continue p.o. Lasix, other BP/CHF  medication as above.  Recently diagnosed PE -No acute issue, continue Eliquis.  Chronic lymphedema -DVT study was done recently which was limited, but he is on Eliquis already. -Recommend limb elevation and compression stocking.  Chronic back pain with spinal stenosis -Continue home dose of narcotics.  BPH -No acute concern, continue Flomax   DVT prophylaxis: Eliquis Code Status: DNR Family Communication: None at bedside Disposition Plan: Tele obs Consults called: None  Admission status: Tele obs   Lequita Halt MD [facility] Pager [phone]  07/24/2022, 10:12 AM

## 2022-07-24 NOTE — ED Notes (Signed)
Pt ambulatory to bathroom with walker.

## 2022-07-24 NOTE — ED Provider Notes (Signed)
[facility] Emergency Department Provider Note MRN:  [id number]  Arrival date & time: 07/24/22     Chief Complaint   Shortness of Breath   History of Present Illness   [name] is a 88 y.o. year-old male with a history of CAD, CHF presenting to the ED with chief complaint of shortness of breath.  Chest heaviness that started suddenly earlier today, associated with shortness of breath.  Feels like he has to intentionally take a deep breath to get any air.  History of blood clots.  Has been taking all of his medicines like normal.  Chronic leg swelling bilaterally that is unchanged.  Review of Systems  A thorough review of systems was obtained and all systems are negative except as noted in the HPI and PMH.   Patient's Health History    Past Medical History:  Diagnosis Date   ACTINIC KERATOSIS, HEAD 03/23/2008   ALLERGIC RHINITIS 03/27/2007   Arthritis    BENIGN PROSTATIC HYPERTROPHY, WITH URINARY OBSTRUCTION 10/16/2007   CAD 08/18/2007   "patient denies any issues with heart, does not see cardiologist"   Carpal tunnel syndrome, bilateral 09/04/2016   CARPAL TUNNEL SYNDROME, LEFT 04/13/2008   CHF (congestive heart failure) (HCC)    Chronic back pain    Chronic neck pain    Complication of anesthesia    left lower jaw teeth have been injured in the past and he has lost those, now has 2 loose front teeth due to a mouth injury on 01/02/20   DVT (deep venous thrombosis) (HCC)    ERECTILE DYSFUNCTION, ORGANIC 02/20/2010   Gout, unspecified 02/20/2010   Headache(784.0)    migraines hx of- started at age 21- none since 24   History of melanoma    HYPERLIPIDEMIA, WITH HIGH HDL 10/18/2008   HYPERTENSION 05/09/2007   sees Dr. Benay Pillow   KNEE PAIN 10/10/2009   LOW BACK PAIN 03/27/2007   NEOPLASM, MALIGNANT, SKIN, TRUNK 05/09/2007   squamous cell on scalp, sideburn and ear (right). meyloma on truckl.   Patellar tendinitis 10/10/2009   Sciatic nerve pain    on  Oxycodone as needed   SKIN CANCER, HX OF 03/27/2007   UNS ADVRS EFF UNS RX MEDICINAL&BIOLOGICAL SBSTNC 08/18/2007   Urinary urgency     Past Surgical History:  Procedure Laterality Date   APPENDECTOMY  0300   APPLICATION OF WOUND VAC Left 01/19/2020   Procedure: Application Of Wound Vac;  Surgeon: [name], MD;  Location: [facility];  Service: Orthopedics;  Laterality: Left;   BLEPHAROPLASTY     CARPAL TUNNEL RELEASE Right    CHOLECYSTECTOMY     ESOPHAGOGASTRODUODENOSCOPY (EGD) WITH PROPOFOL N/A 04/26/2015   Procedure: ESOPHAGOGASTRODUODENOSCOPY (EGD) WITH PROPOFOL;  Surgeon: Inda Castle, MD;  Location: [facility];  Service: Endoscopy;  Laterality: N/A;   EYE SURGERY     bilateral cataracts removed and vitrectomies   JOINT REPLACEMENT Right    knee   KNEE ARTHROSCOPY     KYPHOPLASTY N/A 06/10/2020   Procedure: Thoracic Twelve, Lumbar One, Lumbar Two Kyphoplasty;  Surgeon: Ashok Pall, MD;  Location: [place];  Service: Neurosurgery;  Laterality: N/A;  3C/RM 21   KYPHOPLASTY N/A 09/07/2020   Procedure: Lumbar three KYPHOPLASTY;  Surgeon: Ashok Pall, MD;  Location: [place];  Service: Neurosurgery;  Laterality: N/A;   LAMINECTOMY     LATERAL FUSION LUMBAR SPINE, TRANSVERSE     LUMBAR LAMINECTOMY/DECOMPRESSION MICRODISCECTOMY Left 11/03/2012   Procedure: LUMBAR LAMINECTOMY/DECOMPRESSION MICRODISCECTOMY 1  LEVEL;  Surgeon: Hosie Spangle, MD;  Location: [place] [facility];  Service: Neurosurgery;  Laterality: Left;  LEFT L5S1 laminotomy foraminotomy and possible microdiskectomy   MELANOMA EXCISION     scalp   NASAL SINUS SURGERY     ORIF PATELLA Left 01/19/2020   Procedure: LEFT OPEN REDUCTION INTERNAL (ORIF) FIXATION PATELLA WITH AUTO GRAFTING USING HAMSTRING REINFORCEMENT;  Surgeon: [name], MD;  Location: [place];  Service: Orthopedics;  Laterality: Left;   POSTERIOR CERVICAL FUSION/FORAMINOTOMY Right 10/01/2012   Procedure: POSTERIOR CERVICAL FUSION/FORAMINOTOMY LEVEL 1;   Surgeon: Hosie Spangle, MD;  Location: [place] [facility];  Service: Neurosurgery;  Laterality: Right;  Right Cervical seven thoracic one Cervical laminectomy/foraminotomy with posterior cervical arthrodesis    POSTERIOR LUMBAR FUSION N/A 05/23/2013   Procedure: exploration of lumbar wound, explantation of left interbody implant.;  Surgeon: Hosie Spangle, MD;  Location: [place];  Service: Neurosurgery;  Laterality: N/A;   RECTAL SURGERY     Fissure   TOTAL KNEE ARTHROPLASTY Left 05/07/2019   Procedure: LEFT TOTAL KNEE ARTHROPLASTY;  Surgeon: [name], MD;  Location: [place];  Service: Orthopedics;  Laterality: Left;   VITRECTOMY     right and left 2013    Family History  Problem Relation Age of Onset   Dementia Mother    Anemia Father    Heart disease Father    Stroke Sister        ICH   Lung disease Sister     Social History   Socioeconomic History   Marital status: Married    Spouse name: Not on file   Number of children: 3   Years of education: 12   Highest education level: Not on file  Occupational History   Occupation: Retired  Tobacco Use   Smoking status: Former    Packs/day: 1.00    Years: 33.00    Total pack years: 33.00    Types: Cigarettes    Quit date: 09/29/1981    Years since quitting: 40.8   Smokeless tobacco: Never  Vaping Use   Vaping Use: Never used  Substance and Sexual Activity   Alcohol use: Not Currently    Comment: rare   Drug use: No   Sexual activity: Yes    Partners: Female    Comment: Married  Other Topics Concern   Not on file  Social History Narrative   Lives at home w/ his wife   Left-handed   Caffeine: 1 cup coffee daily   1 son alive, 2 sons deceased   Retired Building control surveyor, Furniture conservator/restorer.    Education: GED   Social Determinants of Health   Financial Resource Strain: [place]  (11/20/2021)   Overall Financial Resource Strain (CARDIA)    Difficulty of Paying Living Expenses: Not hard at all  Food Insecurity: No Food Insecurity  (11/20/2021)   Hunger Vital Sign    Worried About Running Out of Food in the Last Year: Never true    Loving in the Last Year: Never true  Transportation Needs: No Transportation Needs (11/20/2021)   PRAPARE - Hydrologist (Medical): No    Lack of Transportation (Non-Medical): No  Physical Activity: Inactive (11/20/2021)   Exercise Vital Sign    Days of Exercise per Week: 0 days    Minutes of Exercise per Session: 0 min  Stress: No Stress Concern Present (11/20/2021)   [place]  Feeling of Stress : Not at all  Social Connections: Moderately Isolated (11/20/2021)   Social Connection and Isolation Panel [NHANES]    Frequency of Communication with Friends and Family: Twice a week    Frequency of Social Gatherings with Friends and Family: Twice a week    Attends Religious Services: Never    Marine scientist or Organizations: No    Attends Archivist Meetings: Never    Marital Status: Married  Human resources officer Violence: Not At Risk (11/20/2021)   Humiliation, Afraid, Rape, and Kick questionnaire    Fear of Current or Ex-Partner: No    Emotionally Abused: No    Physically Abused: No    Sexually Abused: No     Physical Exam   Vitals:   07/24/22 0535 07/24/22 0604  BP: (!) 186/87 (!) 180/96  Pulse: 80 94  Resp: 16 17  Temp:    SpO2: 98% 99%    CONSTITUTIONAL: Well-appearing, NAD NEURO/PSYCH:  Alert and oriented x 3, no focal deficits EYES:  eyes equal and reactive ENT/NECK:  no LAD, no JVD CARDIO: Regular rate, well-perfused, normal S1 and S2 PULM:  CTAB no wheezing or rhonchi GI/GU:  non-distended, non-tender MSK/SPINE:  No gross deformities 2+ pitting edema to bilateral lower extremities SKIN:  no rash, atraumatic   *Additional and/or pertinent findings included in MDM below  Diagnostic and Interventional Summary    EKG  Interpretation  Date/Time:  July 23, 2022 at 20:16:32 Ventricular Rate:   115 PR Interval:   158 QRS Duration:  102 QT Interval:  348  QTC Calculation:  481 R Axis:     Text Interpretation: Sinus tachycardia, no significant change from prior       Labs Reviewed  CBC - Abnormal; Notable for the following components:      Result Value   RBC 4.10 (*)    Hemoglobin 12.0 (*)    HCT 37.4 (*)    Platelets 412 (*)    All other components within normal limits  COMPREHENSIVE METABOLIC PANEL - Abnormal; Notable for the following components:   CO2 19 (*)    Glucose, Bld 106 (*)    Calcium 8.8 (*)    Total Protein 6.2 (*)    Albumin 3.1 (*)    All other components within normal limits  BRAIN NATRIURETIC PEPTIDE - Abnormal; Notable for the following components:   B Natriuretic Peptide 470.3 (*)    All other components within normal limits  TROPONIN I (HIGH SENSITIVITY)  TROPONIN I (HIGH SENSITIVITY)    CT Angio Chest Pulmonary Embolism (PE) W or WO Contrast  Final Result      Medications  nitroGLYCERIN (NITROSTAT) SL tablet 0.4 mg (0.4 mg Sublingual Given 07/23/22 2014)  hydrALAZINE (APRESOLINE) injection 20 mg (20 mg Intravenous Given 07/24/22 0412)  aspirin chewable tablet 324 mg (324 mg Oral Given 07/24/22 0412)  fentaNYL (SUBLIMAZE) injection 50 mcg (50 mcg Intravenous Given 07/24/22 0442)  iohexol (OMNIPAQUE) 350 MG/ML injection 75 mL (75 mLs Intravenous Contrast Given 07/24/22 0518)     Procedures  /  Critical Care .Critical Care  Performed by: Maudie Flakes, MD Authorized by: Maudie Flakes, MD   Critical care provider statement:    Critical care time (minutes):  45   Critical care was necessary to treat or prevent imminent or life-threatening deterioration of the following conditions: hypertensive urgency.   Critical care was time spent personally by me on the following activities:  Development of treatment plan  with patient or surrogate, discussions with  consultants, evaluation of patient's response to treatment, examination of patient, ordering and review of laboratory studies, ordering and review of radiographic studies, ordering and performing treatments and interventions, pulse oximetry, re-evaluation of patient's condition and review of old charts   ED Course and Medical Decision Making  Initial Impression and Ddx Differential diagnosis includes ACS, PE, CHF exacerbation, hypertensive urgency or emergency  Past medical/surgical history that increases complexity of ED encounter: History of hypertension, PE, CAD  Interpretation of Diagnostics I personally reviewed the EKG and my interpretation is as follows: Sinus tachycardia, no significant change from prior  Labs without significant blood count or electrolyte disturbance, troponin negative x 2, BNP is elevated, CT imaging is without PE or other acute process.  Patient Reassessment and Ultimate Disposition/Management     Patient with persistent chest pressure and hypertension.  IV hydralazine able to reduce blood pressure a bit but still in the 846N systolic.  Patient says his blood pressure is normally very reliably normal.  Will admit to medicine for hypertensive urgency.  Patient management required discussion with the following services or consulting groups:  Hospitalist Service  Complexity of Problems Addressed Acute illness or injury that poses threat of life of bodily function  Additional Data Reviewed and Analyzed Further history obtained from: Prior labs/imaging results  Additional Factors Impacting ED Encounter Risk Consideration of hospitalization  Barth Kirks. [place] Small, [place] [email or web address]  Final Clinical Impressions(s) / ED Diagnoses     ICD-10-CM   1. Chest pain, unspecified type  R07.9     2. Hypertensive urgency  I16.0       ED Discharge Orders     None        Discharge Instructions  Discussed with and Provided to Patient:   Discharge Instructions   None      Maudie Flakes, MD 07/24/22 [phone]

## 2022-07-24 NOTE — ED Notes (Signed)
Kitchen called for breakfast tray.

## 2022-07-25 ENCOUNTER — Encounter (HOSPITAL_COMMUNITY): Payer: Self-pay | Admitting: Internal Medicine

## 2022-07-25 DIAGNOSIS — Z85828 Personal history of other malignant neoplasm of skin: Secondary | ICD-10-CM | POA: Diagnosis not present

## 2022-07-25 DIAGNOSIS — Z8249 Family history of ischemic heart disease and other diseases of the circulatory system: Secondary | ICD-10-CM | POA: Diagnosis not present

## 2022-07-25 DIAGNOSIS — K5903 Drug induced constipation: Secondary | ICD-10-CM | POA: Diagnosis present

## 2022-07-25 DIAGNOSIS — E785 Hyperlipidemia, unspecified: Secondary | ICD-10-CM | POA: Diagnosis present

## 2022-07-25 DIAGNOSIS — Z87891 Personal history of nicotine dependence: Secondary | ICD-10-CM | POA: Diagnosis not present

## 2022-07-25 DIAGNOSIS — Z66 Do not resuscitate: Secondary | ICD-10-CM | POA: Diagnosis present

## 2022-07-25 DIAGNOSIS — Z79899 Other long term (current) drug therapy: Secondary | ICD-10-CM | POA: Diagnosis not present

## 2022-07-25 DIAGNOSIS — Z888 Allergy status to other drugs, medicaments and biological substances status: Secondary | ICD-10-CM | POA: Diagnosis not present

## 2022-07-25 DIAGNOSIS — Z981 Arthrodesis status: Secondary | ICD-10-CM | POA: Diagnosis not present

## 2022-07-25 DIAGNOSIS — I25119 Atherosclerotic heart disease of native coronary artery with unspecified angina pectoris: Secondary | ICD-10-CM | POA: Diagnosis present

## 2022-07-25 DIAGNOSIS — Z7901 Long term (current) use of anticoagulants: Secondary | ICD-10-CM | POA: Diagnosis not present

## 2022-07-25 DIAGNOSIS — G8929 Other chronic pain: Secondary | ICD-10-CM | POA: Diagnosis present

## 2022-07-25 DIAGNOSIS — Z96652 Presence of left artificial knee joint: Secondary | ICD-10-CM | POA: Diagnosis present

## 2022-07-25 DIAGNOSIS — Z85820 Personal history of malignant melanoma of skin: Secondary | ICD-10-CM | POA: Diagnosis not present

## 2022-07-25 DIAGNOSIS — M109 Gout, unspecified: Secondary | ICD-10-CM | POA: Diagnosis present

## 2022-07-25 DIAGNOSIS — I130 Hypertensive heart and chronic kidney disease with heart failure and stage 1 through stage 4 chronic kidney disease, or unspecified chronic kidney disease: Secondary | ICD-10-CM | POA: Diagnosis present

## 2022-07-25 DIAGNOSIS — I890 Lymphedema, not elsewhere classified: Secondary | ICD-10-CM | POA: Diagnosis present

## 2022-07-25 DIAGNOSIS — I509 Heart failure, unspecified: Secondary | ICD-10-CM | POA: Diagnosis present

## 2022-07-25 DIAGNOSIS — N400 Enlarged prostate without lower urinary tract symptoms: Secondary | ICD-10-CM | POA: Diagnosis present

## 2022-07-25 DIAGNOSIS — I160 Hypertensive urgency: Secondary | ICD-10-CM | POA: Diagnosis present

## 2022-07-25 DIAGNOSIS — Z7982 Long term (current) use of aspirin: Secondary | ICD-10-CM | POA: Diagnosis not present

## 2022-07-25 DIAGNOSIS — I5023 Acute on chronic systolic (congestive) heart failure: Secondary | ICD-10-CM | POA: Diagnosis present

## 2022-07-25 DIAGNOSIS — I428 Other cardiomyopathies: Secondary | ICD-10-CM | POA: Diagnosis present

## 2022-07-25 DIAGNOSIS — Z86711 Personal history of pulmonary embolism: Secondary | ICD-10-CM | POA: Diagnosis not present

## 2022-07-25 DIAGNOSIS — Z823 Family history of stroke: Secondary | ICD-10-CM | POA: Diagnosis not present

## 2022-07-25 LAB — BASIC METABOLIC PANEL
Anion gap: 10 (ref 5–15)
BUN: 13 mg/dL (ref 8–23)
CO2: 21 mmol/L — ABNORMAL LOW (ref 22–32)
Calcium: 8.5 mg/dL — ABNORMAL LOW (ref 8.9–10.3)
Chloride: 106 mmol/L (ref 98–111)
Creatinine, Ser: 1.19 mg/dL (ref 0.61–1.24)
GFR, Estimated: 59 mL/min — ABNORMAL LOW (ref >60)
Glucose, Bld: 161 mg/dL — ABNORMAL HIGH (ref 70–99)
Potassium: 3.8 mmol/L (ref 3.5–5.1)
Sodium: 137 mmol/L (ref 135–145)

## 2022-07-25 MED ORDER — FUROSEMIDE 20 MG PO TABS
20.0000 mg | ORAL_TABLET | Freq: Two times a day (BID) | ORAL | Status: DC
  Administered 2022-07-25 – 2022-07-28 (×6): 20 mg via ORAL
  Filled 2022-07-25 (×6): qty 1

## 2022-07-25 MED ORDER — CARVEDILOL 6.25 MG PO TABS
6.2500 mg | ORAL_TABLET | Freq: Two times a day (BID) | ORAL | Status: DC
  Administered 2022-07-25 – 2022-07-28 (×6): 6.25 mg via ORAL
  Filled 2022-07-25 (×6): qty 1

## 2022-07-25 MED ORDER — POLYETHYLENE GLYCOL 3350 17 G PO PACK
17.0000 g | PACK | Freq: Every day | ORAL | Status: DC
  Administered 2022-07-25 – 2022-07-26 (×2): 17 g via ORAL
  Filled 2022-07-25 (×2): qty 1

## 2022-07-25 MED ORDER — POTASSIUM CHLORIDE CRYS ER 20 MEQ PO TBCR: 40.0000 meq | EXTENDED_RELEASE_TABLET | Freq: Once | ORAL | Status: DC

## 2022-07-25 MED ORDER — HYDRALAZINE HCL 25 MG PO TABS
25.0000 mg | ORAL_TABLET | Freq: Three times a day (TID) | ORAL | Status: DC
  Administered 2022-07-25: 25 mg via ORAL
  Filled 2022-07-25: qty 1

## 2022-07-25 MED ORDER — ORAL CARE MOUTH RINSE: 15.0000 mL | OROMUCOSAL | Status: DC | PRN

## 2022-07-25 MED ORDER — POTASSIUM CHLORIDE CRYS ER 20 MEQ PO TBCR
40.0000 meq | EXTENDED_RELEASE_TABLET | Freq: Once | ORAL | Status: AC
  Administered 2022-07-25: 40 meq via ORAL
  Filled 2022-07-25: qty 2

## 2022-07-25 MED ORDER — HEPARIN (PORCINE) 25000 UT/250ML-% IV SOLN
1500.0000 [IU]/h | INTRAVENOUS | Status: DC
  Administered 2022-07-25 – 2022-07-27 (×3): 1500 [IU]/h via INTRAVENOUS
  Filled 2022-07-25 (×3): qty 250

## 2022-07-25 NOTE — TOC Progression Note (Signed)
Transition of Care [facility]) - Progression Note    Patient Details  Name: [name] MRN: [id number] Date of Birth: [birth year]  [facility] CM/SW Contact  Zenon Mayo, RN Phone Number: 07/25/2022, 4:03 PM  Clinical Narrative:    From home with wife, A/c CHF, recurrent dyspnea, htn urgency, chest pressure, recent PE, chronic back pain.  Conts with medications adjustments.  TOC following.         Expected Discharge Plan and Services                                                 Social Determinants of Health (SDOH) Interventions Alcohol Usage Interventions: Intervention Not Indicated (Score <7) Financial Strain Interventions: Intervention Not Indicated  Readmission Risk Interventions     No data to display

## 2022-07-25 NOTE — Plan of Care (Signed)
Pt received 1 prn dose of hydralazine this shift. Otherwise vitals stable. Denies pain. Ambulated with walker to BR Problem: Education: Goal: Knowledge of General Education information will improve Description: Including pain rating scale, medication(s)/side effects and non-pharmacologic comfort measures Outcome: Progressing   Problem: Health Behavior/Discharge Planning: Goal: Ability to manage health-related needs will improve Outcome: Progressing   Problem: Clinical Measurements: Goal: Ability to maintain clinical measurements within normal limits will improve Outcome: Progressing Goal: Will remain free from infection Outcome: Progressing Goal: Diagnostic test results will improve Outcome: Progressing Goal: Respiratory complications will improve Outcome: Progressing Goal: Cardiovascular complication will be avoided Outcome: Progressing   Problem: Activity: Goal: Risk for activity intolerance will decrease Outcome: Progressing   Problem: Nutrition: Goal: Adequate nutrition will be maintained Outcome: Progressing   Problem: Coping: Goal: Level of anxiety will decrease Outcome: Progressing   Problem: Elimination: Goal: Will not experience complications related to bowel motility Outcome: Progressing Goal: Will not experience complications related to urinary retention Outcome: Progressing   Problem: Pain Managment: Goal: General experience of comfort will improve Outcome: Progressing   Problem: Safety: Goal: Ability to remain free from injury will improve Outcome: Progressing   Problem: Skin Integrity: Goal: Risk for impaired skin integrity will decrease Outcome: Progressing

## 2022-07-25 NOTE — Progress Notes (Signed)
   Heart Failure Stewardship Pharmacist Progress Note   PCP: Hoyt Koch, MD PCP-Cardiologist: None    HPI:  88 yo M with PMH of CAD, BPH, chronic HFpEF, PE, HTN, HLD, chronic opioid-dependent back pain s/p back surgery.   Admitted from 11/7-11/10 with acute PE without RV strain. ECHO 11/8 showed LVEF 40% with mild LVH, RV normal. GDMT initiated and plan for cardiac catheterization on 11/10. Ultimately the patient requested discharge in lieu of inpatient work up, planned to follow up with PCP and cardiology after discharge.   He presented to the ED on 12/11 with chest discomfort, shortness of breath, and HTN. CTA without acute findings, PE resolved.   Current HF Medications: Diuretic: furosemide 20 mg PO BID Beta Blocker: carvedilol 6.25 mg BID Other: Imdur 30 mg daily  Prior to admission HF Medications: Diuretic: furosemide 20 mg daily Beta blocker: metoprolol XL 50 mg daily ACE/ARB/ARNI: losartan 50 mg daily  Pertinent Lab Values: Serum creatinine 1.19, BUN 13, Potassium 3.8, Sodium 137, BNP 470.3  Vital Signs: Weight: 196 lbs Blood pressure: 110/50-160/70s  Heart rate: 90-100s  I/O: not well documented  Medication Assistance / Insurance Benefits Check: Does the patient have prescription insurance?  Yes Type of insurance plan: [facility] Medicare  Outpatient Pharmacy:  Prior to admission outpatient pharmacy: [facility] Is the patient willing to use [place] at discharge? Yes Is the patient willing to transition their outpatient pharmacy to utilize a [facility] outpatient pharmacy?   Pending    Assessment: 1. Acute on chronic systolic CHF (LVEF 41%). NYHA class II symptoms. - Continue furosemide 20 mg PO BID. Strict I/Os and daily weights. - Continue carvedilol 6.25 mg BID - Losartan on hold, BP has been labile - Consider adding MRA and SGLT2i prior to discharge   Plan: 1) Medication changes recommended at this time: - Add low dose losartan and spironolactone  tomorrow if BP stabilizes   2) Patient assistance: - None pending  3)  Education  - Initial education completed - Full education to be completed prior to discharge  Kerby Nora, PharmD, BCPS Heart Failure Cytogeneticist Phone [phone]

## 2022-07-25 NOTE — Progress Notes (Addendum)
PROGRESS NOTE    [name]  HGD:[id number] DOB: Jun 06, 1932 DOA: 07/23/2022 PCP: Hoyt Koch, MD  88/M with history of hypertension, BPH, dyslipidemia, chronic back pain presented to the ED with sudden onset dyspnea in 11/23, was diagnosed with small PE and EF at the time was 40%, cardiac workup deferred. -Back in the ED yesterday with sudden onset shortness of breath, chest pressure and heaviness.  -Troponins negative, EKG with nonspecific ST changes  Subjective: Feels fine, denies any complaints, had sudden onset chest heaviness and shortness of breath yesterday, similar to presentation a month ago  Assessment and Plan:  Recurrent dyspnea Hypertensive urgency Chest pressure -Similar to admission last month, diagnosed with segmental PE last month, started on Eliquis, echo noted EF of 40%, global hypokinesis, normal RV, cardiac workup deferred at the time -Recurrence of symptoms, CTA negative for dissection/PE, suspect undiagnosed CAD, patient is very independent, drives and takes care of his wife, discussed further workup, he is agreeable, -EKG with nonspecific changes, troponin negative -Dr. [name] consulted, seen by him last month -Hold Eliquis, start IV heparin if cath is felt to be appropriate -BP improving, continue carvedilol, Imdur, lasix  Recent PE -Not seen on repeat CTA, hold Eliquis, IV heparin for now  Acute on chronic systolic CHF -Appears mildly volume overloaded, add IV Lasix today -Echo 11/23 with EF of 40%, normal RV -Consider further GDMT depending on above workup  History of chronic lymphedema -Add IV Lasix today, compression stockings  Chronic back pain, spinal stenosis -Continue home regimen of narcotics  BPH -Continue Flomax  DVT prophylaxis: Starting IV heparin today Code Status: DNR Family Communication: Discussed patient detail, no family at bedside Disposition Plan: Home pending cardiac workup  Consultants: Cardiology Dr.  [name]   Procedures:   Antimicrobials:    Objective: Vitals:   07/25/22 0521 07/25/22 0709 07/25/22 0800 07/25/22 1042  BP: (!) 164/72 (!) 149/60  (!) 106/56  Pulse: 83   93  Resp: '15 17  20  '$ Temp: 98.4 F (36.9 C) 98.2 F (36.8 C)  98.1 F (36.7 C)  TempSrc: Oral Oral  Oral  SpO2: 96%  96% 96%  Weight:      Height:        Intake/Output Summary (Last 24 hours) at 07/25/2022 1129 Last data filed at 07/25/2022 0830 Gross per 24 hour  Intake 236 ml  Output --  Net 236 ml   Filed Weights   07/23/22 2009 07/25/22 0029  Weight: 88.5 kg 89.1 kg    Examination:  Elderly male sitting up in bed, AAOx3, no distress HEENT: No JVD CVS: S1-S2, regular rhythm Lungs: Clear bilaterally Abdomen: Soft, nontender, bowel sounds present Extremities: 1+ edema Skin: No rashes Psychiatry:  Mood & affect appropriate.     Data Reviewed:   CBC: Recent Labs  Lab 07/23/22 2038  WBC 7.9  HGB 12.0*  HCT 37.4*  MCV 91.2  PLT 962*   Basic Metabolic Panel: Recent Labs  Lab 07/23/22 2038 07/25/22 0020  NA 135 137  K 4.4 3.8  CL 107 106  CO2 19* 21*  GLUCOSE 106* 161*  BUN 15 13  CREATININE 1.15 1.19  CALCIUM 8.8* 8.5*   GFR: Estimated Creatinine Clearance: 47.1 mL/min (by C-G formula based on SCr of 1.19 mg/dL). Liver Function Tests: Recent Labs  Lab 07/23/22 2038  AST 21  ALT 14  ALKPHOS 92  BILITOT 0.3  PROT 6.2*  ALBUMIN 3.1*   No results for input(s): "LIPASE", "AMYLASE" in the  last 168 hours. No results for input(s): "AMMONIA" in the last 168 hours. Coagulation Profile: No results for input(s): "INR", "PROTIME" in the last 168 hours. Cardiac Enzymes: No results for input(s): "CKTOTAL", "CKMB", "CKMBINDEX", "TROPONINI" in the last 168 hours. BNP (last 3 results) Recent Labs    01/03/22 1050  PROBNP 266.0*   HbA1C: No results for input(s): "HGBA1C" in the last 72 hours. CBG: No results for input(s): "GLUCAP" in the last 168 hours. Lipid  Profile: Recent Labs    07/24/22 0240  CHOL 169  HDL 69  LDLCALC 88  TRIG 60  CHOLHDL 2.4   Thyroid Function Tests: No results for input(s): "TSH", "T4TOTAL", "FREET4", "T3FREE", "THYROIDAB" in the last 72 hours. Anemia Panel: No results for input(s): "VITAMINB12", "FOLATE", "FERRITIN", "TIBC", "IRON", "RETICCTPCT" in the last 72 hours. Urine analysis:    Component Value Date/Time   COLORURINE YELLOW 01/11/2021 [place] 01/11/2021 1242   LABSPEC 1.008 01/11/2021 1242   PHURINE 6.0 01/11/2021 1242   GLUCOSEU NEGATIVE 01/11/2021 1242   GLUCOSEU NEGATIVE 10/03/2018 1036   HGBUR SMALL (A) 01/11/2021 1242   BILIRUBINUR NEGATIVE 01/11/2021 1242   BILIRUBINUR negative 10/17/2018 1555   KETONESUR NEGATIVE 01/11/2021 1242   PROTEINUR NEGATIVE 01/11/2021 1242   UROBILINOGEN 0.2 10/17/2018 1555   UROBILINOGEN 0.2 10/03/2018 1036   NITRITE NEGATIVE 01/11/2021 1242   LEUKOCYTESUR NEGATIVE 01/11/2021 1242   Sepsis Labs: '@LABRCNTIP'$ (procalcitonin:4,lacticidven:4)  )No results found for this or any previous visit (from the past 240 hour(s)).   Radiology Studies: CT Angio Chest Pulmonary Embolism (PE) W or WO Contrast  Result Date: 07/24/2022 CLINICAL DATA:  88 year old male with history of increasing chest heaviness and shortness of breath for the past 3 hours. EXAM: CT ANGIOGRAPHY CHEST WITH CONTRAST TECHNIQUE: Multidetector CT imaging of the chest was performed using the standard protocol during bolus administration of intravenous contrast. Multiplanar CT image reconstructions and MIPs were obtained to evaluate the vascular anatomy. RADIATION DOSE REDUCTION: This exam was performed according to the departmental dose-optimization program which includes automated exposure control, adjustment of the mA and/or kV according to patient size and/or use of iterative reconstruction technique. CONTRAST:  41m OMNIPAQUE IOHEXOL 350 MG/ML SOLN COMPARISON:  Chest CTA 06/20/2022.  FINDINGS: Cardiovascular: Previously noted filling defects in the right upper lobe pulmonary artery branches have resolved, indicative of successful treatment for prior pulmonary embolism. Today's study demonstrates no new filling defects within the pulmonary arterial tree to suggest new pulmonary embolism. Heart size is borderline enlarged. There is no significant pericardial fluid, thickening or pericardial calcification. There is aortic atherosclerosis, as well as atherosclerosis of the great vessels of the mediastinum and the coronary arteries, including calcified atherosclerotic plaque in the left main, left anterior descending, left circumflex and right coronary arteries. Mediastinum/Nodes: No pathologically enlarged mediastinal or hilar lymph nodes. Esophagus is unremarkable in appearance. No axillary lymphadenopathy. Lungs/Pleura: No acute consolidative airspace disease. No pleural effusions. No suspicious appearing pulmonary nodules or masses are noted. Upper Abdomen: 1.4 cm low-attenuation lesion in the central aspect of segment 4A of the liver, compatible with a simple cyst (no imaging follow-up recommended). Aortic atherosclerosis. Musculoskeletal: Chronic compression fractures of T3, T11, T12 and L1 are again noted, most severe at T12 where there is 70% loss of anterior vertebral body height. Post vertebroplasty changes at L1 partially imaged. There are no aggressive appearing lytic or blastic lesions noted in the visualized portions of the skeleton. Review of the MIP images confirms the above findings. IMPRESSION: 1. No  acute findings in the thorax to account for the patient's symptoms. Previously noted right upper lobe pulmonary emboli have resolved compared to prior study 06/20/2022. 2. Aortic atherosclerosis, in addition to left main and three-vessel coronary artery disease. 3. Incidental findings, as above, similar to the prior study. Aortic Atherosclerosis (ICD10-I70.0). Electronically Signed    By: Vinnie Langton M.D.   On: 07/24/2022 05:34     Scheduled Meds:  aspirin EC  81 mg Oral Daily   carvedilol  3.125 mg Oral BID WC   fluticasone  1 spray Each Nare q AM   furosemide  20 mg Oral Daily   hydrALAZINE  25 mg Oral TID    HYDROmorphone (DILAUDID) injection  1 mg Intravenous Once   isosorbide mononitrate  30 mg Oral Daily   losartan  50 mg Oral Daily   Naldemedine Tosylate  0.2 mg Oral q AM   polyethylene glycol  17 g Oral Daily   sodium chloride flush  3 mL Intravenous Q12H   tamsulosin  0.4 mg Oral Daily   Continuous Infusions:  sodium chloride       LOS: 0 days    Time spent: 77mn   PDomenic Polite MD [facility]   07/25/2022, 11:29 AM

## 2022-07-25 NOTE — Progress Notes (Signed)
ANTICOAGULATION CONSULT NOTE - Initial Consult  Pharmacy Consult for apixaban > heparin  Indication: pulmonary embolus recent 11/23  Allergies  Allergen Reactions   Amlodipine Swelling and Other (See Comments)    Ankle swelling   Lyrica [Pregabalin] Swelling and Other (See Comments)    Hands and feet swell   Gabapentin Other (See Comments)    Lower extremity edema- knees and feet    Patient Measurements: Height: 6' (182.9 cm) Weight: 89.1 kg (196 lb 6.4 oz) IBW/kg (Calculated) : 77.6   Vital Signs: Temp: 98.1 F (36.7 C) (12/13 1042) Temp Source: Oral (12/13 1042) BP: 106/56 (12/13 1042) Pulse Rate: 93 (12/13 1042)  Labs: Recent Labs    07/23/22 2038 07/24/22 0240 07/25/22 0020  HGB 12.0*  --   --   HCT 37.4*  --   --   PLT 412*  --   --   CREATININE 1.15  --  1.19  TROPONINIHS 12 14  --     Estimated Creatinine Clearance: 47.1 mL/min (by C-G formula based on SCr of 1.19 mg/dL).   Medical History: Past Medical History:  Diagnosis Date   ACTINIC KERATOSIS, HEAD 03/23/2008   ALLERGIC RHINITIS 03/27/2007   Arthritis    BENIGN PROSTATIC HYPERTROPHY, WITH URINARY OBSTRUCTION 10/16/2007   CAD 08/18/2007   "patient denies any issues with heart, does not see cardiologist"   Carpal tunnel syndrome, bilateral 09/04/2016   CARPAL TUNNEL SYNDROME, LEFT 04/13/2008   CHF (congestive heart failure) (HCC)    Chronic back pain    Chronic neck pain    Complication of anesthesia    left lower jaw teeth have been injured in the past and he has lost those, now has 2 loose front teeth due to a mouth injury on 01/02/20   DVT (deep venous thrombosis) (HCC)    ERECTILE DYSFUNCTION, ORGANIC 02/20/2010   Gout, unspecified 02/20/2010   Headache(784.0)    migraines hx of- started at age 23- none since 40   History of melanoma    HYPERLIPIDEMIA, WITH HIGH HDL 10/18/2008   HYPERTENSION 05/09/2007   sees Dr. Benay Pillow   KNEE PAIN 10/10/2009   LOW BACK PAIN 03/27/2007   NEOPLASM, MALIGNANT,  SKIN, TRUNK 05/09/2007   squamous cell on scalp, sideburn and ear (right). meyloma on truckl.   Patellar tendinitis 10/10/2009   Sciatic nerve pain    on Oxycodone as needed   SKIN CANCER, HX OF 03/27/2007   UNS ADVRS EFF UNS RX MEDICINAL&BIOLOGICAL SBSTNC 08/18/2007   Urinary urgency       Assessment: 88yom admitted with SOB, s/p recent PE and EF 40%.  Patient has been on apixaban - will hold for possible procedures and begin heparin drip  Will dose heparin with aptt since apixaban causes false elevation of heparin levels .   Goal of Therapy:  Heparin level 0.3-0.7 units/ml aPTT 66-100 seconds Monitor platelets by anticoagulation protocol: Yes   Plan:  Stop apixaban  Start heparin drip 1500 uts/hr at 8pm  Draw daily aptt, heparin level and cbc  Monitor s/s bleeding    Bonnita Nasuti Pharm.D. CPP, BCPS Clinical Pharmacist [phone] 07/25/2022 12:16 PM

## 2022-07-25 NOTE — Progress Notes (Signed)
Heart Failure Navigator Progress Note  Assessed for Heart & Vascular TOC clinic readiness. Completed SDOH,  no immediate needs noted. Pt is primary caregiver for spouse. Sent secure chat to Dr. [name], pt primary cardiologist. If unable to get close cardiology care follow-up, HV TOC available-earliest appt 08/15/22.   Education Assessment and Provision:  Detailed education and instructions provided on heart failure disease management including the following:  Signs and symptoms of Heart Failure When to call the physician Importance of daily weights Low sodium diet Fluid restriction Medication management Anticipated future follow-up appointments  Patient education given on each of the above topics.  Patient acknowledges understanding via teach back method and acceptance of all instructions.  Education Materials:  "Living Better With Heart Failure" Booklet, HF zone tool, & Daily Weight Tracker Tool.  Patient has scale at home: yes Patient has pill box at home: yes    Navigator available for reassessment of patient.     Pricilla Holm, MSN, RN Heart Failure Nurse Navigator

## 2022-07-25 NOTE — Consult Note (Signed)
Referring Physician: Domenic Polite, MD  [name] is an 88 y.o. male.                       Chief Complaint: Chest pain  HPI: 88 years old white male with PE diagnosed in 06/2022 and treated with Eliquis, PMH of CHF, Chronic back pain, HTN, HLD and Sciatic nerve pain had chest pressure with shortness of breath. CT of chest shows resolution of previous PE and coronary atherosclerosis.  Patient prefers cardiac catheterization instead of medical therapy alone. EKG today shows sinus tachycardia and non-specific T wave changes. Echocardiogram 06/20/2022 showed moderate LV systolic dysfunction with 40-45 % LVEF.  Past Medical History:  Diagnosis Date   ACTINIC KERATOSIS, HEAD 03/23/2008   ALLERGIC RHINITIS 03/27/2007   Arthritis    BENIGN PROSTATIC HYPERTROPHY, WITH URINARY OBSTRUCTION 10/16/2007   CAD 08/18/2007   "patient denies any issues with heart, does not see cardiologist"   Carpal tunnel syndrome, bilateral 09/04/2016   CARPAL TUNNEL SYNDROME, LEFT 04/13/2008   CHF (congestive heart failure) (HCC)    Chronic back pain    Chronic neck pain    Complication of anesthesia    left lower jaw teeth have been injured in the past and he has lost those, now has 2 loose front teeth due to a mouth injury on 01/02/20   DVT (deep venous thrombosis) (HCC)    ERECTILE DYSFUNCTION, ORGANIC 02/20/2010   Gout, unspecified 02/20/2010   Headache(784.0)    migraines hx of- started at age 54- none since 34   History of melanoma    HYPERLIPIDEMIA, WITH HIGH HDL 10/18/2008   HYPERTENSION 05/09/2007   sees Dr. Benay Pillow   KNEE PAIN 10/10/2009   LOW BACK PAIN 03/27/2007   NEOPLASM, MALIGNANT, SKIN, TRUNK 05/09/2007   squamous cell on scalp, sideburn and ear (right). meyloma on truckl.   Patellar tendinitis 10/10/2009   Sciatic nerve pain    on Oxycodone as needed   SKIN CANCER, HX OF 03/27/2007   UNS ADVRS EFF UNS RX MEDICINAL&BIOLOGICAL SBSTNC 08/18/2007   Urinary urgency       Past Surgical History:   Procedure Laterality Date   APPENDECTOMY  4315   APPLICATION OF WOUND VAC Left 01/19/2020   Procedure: Application Of Wound Vac;  Surgeon: [name], MD;  Location: [facility];  Service: Orthopedics;  Laterality: Left;   BLEPHAROPLASTY     CARPAL TUNNEL RELEASE Right    CHOLECYSTECTOMY     ESOPHAGOGASTRODUODENOSCOPY (EGD) WITH PROPOFOL N/A 04/26/2015   Procedure: ESOPHAGOGASTRODUODENOSCOPY (EGD) WITH PROPOFOL;  Surgeon: Inda Castle, MD;  Location: [facility];  Service: Endoscopy;  Laterality: N/A;   EYE SURGERY     bilateral cataracts removed and vitrectomies   JOINT REPLACEMENT Right    knee   KNEE ARTHROSCOPY     KYPHOPLASTY N/A 06/10/2020   Procedure: Thoracic Twelve, Lumbar One, Lumbar Two Kyphoplasty;  Surgeon: Ashok Pall, MD;  Location: [facility];  Service: Neurosurgery;  Laterality: N/A;  3C/RM 21   KYPHOPLASTY N/A 09/07/2020   Procedure: Lumbar three KYPHOPLASTY;  Surgeon: Ashok Pall, MD;  Location: [facility];  Service: Neurosurgery;  Laterality: N/A;   LAMINECTOMY     LATERAL FUSION LUMBAR SPINE, TRANSVERSE     LUMBAR LAMINECTOMY/DECOMPRESSION MICRODISCECTOMY Left 11/03/2012   Procedure: LUMBAR LAMINECTOMY/DECOMPRESSION MICRODISCECTOMY 1 LEVEL;  Surgeon: Hosie Spangle, MD;  Location: [facility];  Service: Neurosurgery;  Laterality: Left;  LEFT L5S1 laminotomy foraminotomy and possible microdiskectomy  MELANOMA EXCISION     scalp   NASAL SINUS SURGERY     ORIF PATELLA Left 01/19/2020   Procedure: LEFT OPEN REDUCTION INTERNAL (ORIF) FIXATION PATELLA WITH AUTO GRAFTING USING HAMSTRING REINFORCEMENT;  Surgeon: [name], MD;  Location: [facility];  Service: Orthopedics;  Laterality: Left;   POSTERIOR CERVICAL FUSION/FORAMINOTOMY Right 10/01/2012   Procedure: POSTERIOR CERVICAL FUSION/FORAMINOTOMY LEVEL 1;  Surgeon: Hosie Spangle, MD;  Location: [facility];  Service: Neurosurgery;  Laterality: Right;  Right Cervical seven thoracic one Cervical  laminectomy/foraminotomy with posterior cervical arthrodesis    POSTERIOR LUMBAR FUSION N/A 05/23/2013   Procedure: exploration of lumbar wound, explantation of left interbody implant.;  Surgeon: Hosie Spangle, MD;  Location: [facility];  Service: Neurosurgery;  Laterality: N/A;   RECTAL SURGERY     Fissure   TOTAL KNEE ARTHROPLASTY Left 05/07/2019   Procedure: LEFT TOTAL KNEE ARTHROPLASTY;  Surgeon: [name], MD;  Location: [facility];  Service: Orthopedics;  Laterality: Left;   VITRECTOMY     right and left 2013    Family History  Problem Relation Age of Onset   Dementia Mother    Anemia Father    Heart disease Father    Stroke Sister        ICH   Lung disease Sister    Social History:  reports that he quit smoking about 40 years ago. His smoking use included cigarettes. He has a 33.00 pack-year smoking history. He has never used smokeless tobacco. He reports that he does not currently use alcohol. He reports that he does not use drugs.  Allergies:  Allergies  Allergen Reactions   Amlodipine Swelling and Other (See Comments)    Ankle swelling   Lyrica [Pregabalin] Swelling and Other (See Comments)    Hands and feet swell   Gabapentin Other (See Comments)    Lower extremity edema- knees and feet    Medications Prior to Admission  Medication Sig Dispense Refill   apixaban (ELIQUIS) 5 MG TABS tablet Take 1 tablet (5 mg total) by mouth 2 (two) times daily. 180 tablet 1   clotrimazole (LOTRIMIN) 1 % cream APPLY TO AFFECTED AREA TWICE A DAY (Patient taking differently: Apply 1 Application topically 2 (two) times daily as needed (for dryness).) 30 g 0   fluticasone (FLONASE) 50 MCG/ACT nasal spray Place 1 spray into both nostrils in the morning.     furosemide (LASIX) 20 MG tablet Take 1 tablet (20 mg total) by mouth daily. 90 tablet 3   losartan (COZAAR) 50 MG tablet Take 1 tablet (50 mg total) by mouth daily. 90 tablet 3   metoprolol succinate (TOPROL XL) 50 MG 24 hr tablet  Take 1 tablet (50 mg total) by mouth daily. 90 tablet 3   Oxycodone HCl 10 MG TABS Take 10-20 mg by mouth See admin instructions. Take 20 mg by mouth in the morning and evening. May take an additional 10 mg once a day around noontime as needed for unresolved pain.     SYMPROIC 0.2 MG TABS Take 0.2 mg by mouth in the morning.     tamsulosin (FLOMAX) 0.4 MG CAPS capsule TAKE 1 CAPSULE BY MOUTH EVERY DAY (Patient taking differently: Take 0.4 mg by mouth at bedtime.) 90 capsule 1   APIXABAN (ELIQUIS) VTE STARTER PACK ('10MG'$  AND '5MG'$ ) Take 2 tablets ('10mg'$ ) by mouth twice daily for 7 days, then 1 tablet ('5mg'$ ) twice daily 74 tablet 0   aspirin EC 81 MG tablet  Take 1 tablet (81 mg total) by mouth daily. Swallow whole. 30 tablet 0    Results for orders placed or performed during the hospital encounter of 07/23/22 (from the past 48 hour(s))  CBC     Status: Abnormal   Collection Time: 07/23/22  8:38 PM  Result Value Ref Range   WBC 7.9 4.0 - 10.5 K/uL   RBC 4.10 (L) 4.22 - 5.81 MIL/uL   Hemoglobin 12.0 (L) 13.0 - 17.0 g/dL   HCT 37.4 (L) 39.0 - 52.0 %   MCV 91.2 80.0 - 100.0 fL   MCH 29.3 26.0 - 34.0 pg   MCHC 32.1 30.0 - 36.0 g/dL   RDW 14.1 11.5 - 15.5 %   Platelets 412 (H) 150 - 400 K/uL   nRBC 0.0 0.0 - 0.2 %    Comment: Performed at [facility], [address], [place], [place] [address]  Troponin I (High Sensitivity)     Status: None   Collection Time: 07/23/22  8:38 PM  Result Value Ref Range   Troponin I (High Sensitivity) 12 <18 ng/L    Comment: (NOTE) Elevated high sensitivity troponin I (hsTnI) values and significant  changes across serial measurements may suggest ACS but many other  chronic and acute conditions are known to elevate hsTnI results.  Refer to the "Links" section for chest pain algorithms and additional  guidance. Performed at [facility], [place] [address], [place], [place] [address]   Comprehensive metabolic panel     Status: Abnormal   Collection Time:  07/23/22  8:38 PM  Result Value Ref Range   Sodium 135 135 - 145 mmol/L   Potassium 4.4 3.5 - 5.1 mmol/L   Chloride 107 98 - 111 mmol/L   CO2 19 (L) 22 - 32 mmol/L   Glucose, Bld 106 (H) 70 - 99 mg/dL    Comment: Glucose reference range applies only to samples taken after fasting for at least 8 hours.   BUN 15 8 - 23 mg/dL   Creatinine, Ser 1.15 0.61 - 1.24 mg/dL   Calcium 8.8 (L) 8.9 - 10.3 mg/dL   Total Protein 6.2 (L) 6.5 - 8.1 g/dL   Albumin 3.1 (L) 3.5 - 5.0 g/dL   AST 21 15 - 41 U/L   ALT 14 0 - 44 U/L   Alkaline Phosphatase 92 38 - 126 U/L   Total Bilirubin 0.3 0.3 - 1.2 mg/dL   GFR, Estimated >60 >60 mL/min    Comment: (NOTE) Calculated using the CKD-EPI Creatinine Equation (2021)    Anion gap 9 5 - 15    Comment: Performed at [facility], [place] [address] [place], [place] [address]  Troponin I (High Sensitivity)     Status: None   Collection Time: 07/24/22  2:40 AM  Result Value Ref Range   Troponin I (High Sensitivity) 14 <18 ng/L    Comment: (NOTE) Elevated high sensitivity troponin I (hsTnI) values and significant  changes across serial measurements may suggest ACS but many other  chronic and acute conditions are known to elevate hsTnI results.  Refer to the "Links" section for chest pain algorithms and additional  guidance. Performed at [facility], [place] [address], [place], [place] [address]   Brain natriuretic peptide     Status: Abnormal   Collection Time: 07/24/22  2:40 AM  Result Value Ref Range   B Natriuretic Peptide 470.3 (H) 0.0 - 100.0 pg/mL    Comment: Performed at  [facility], [place] [address] [place], [place] [address]  Lipid panel     Status: None   Collection Time: 07/24/22  2:40 AM  Result Value Ref Range   Cholesterol 169 0 - 200 mg/dL   Triglycerides 60 <150 mg/dL   HDL 69 >40 mg/dL   Total CHOL/HDL Ratio 2.4 RATIO   VLDL 12 0 - 40 mg/dL   LDL Cholesterol 88 0 - 99 mg/dL    Comment:        Total Cholesterol/HDL:CHD  Risk Coronary Heart Disease Risk Table                     Men   Women  1/2 Average Risk   3.4   3.3  Average Risk       5.0   4.4  2 X Average Risk   9.6   7.1  3 X Average Risk  23.4   11.0        Use the calculated Patient Ratio above and the CHD Risk Table to determine the patient's CHD Risk.        ATP III CLASSIFICATION (LDL):  <100     mg/dL   Optimal  100-129  mg/dL   Near or Above                    Optimal  130-159  mg/dL   Borderline  160-189  mg/dL   High  >190     mg/dL   Very High Performed at [facility] [address], [place], [place] [address]   Basic metabolic panel     Status: Abnormal   Collection Time: 07/25/22 12:20 AM  Result Value Ref Range   Sodium 137 135 - 145 mmol/L   Potassium 3.8 3.5 - 5.1 mmol/L   Chloride 106 98 - 111 mmol/L   CO2 21 (L) 22 - 32 mmol/L   Glucose, Bld 161 (H) 70 - 99 mg/dL    Comment: Glucose reference range applies only to samples taken after fasting for at least 8 hours.   BUN 13 8 - 23 mg/dL   Creatinine, Ser 1.19 0.61 - 1.24 mg/dL   Calcium 8.5 (L) 8.9 - 10.3 mg/dL   GFR, Estimated 59 (L) >60 mL/min    Comment: (NOTE) Calculated using the CKD-EPI Creatinine Equation (2021)    Anion gap 10 5 - 15    Comment: Performed at [facility] [address] [place],  [address]   *Note: Due to a large number of results and/or encounters for the requested time period, some results have not been displayed. A complete set of results can be found in Results Review.   CT Angio Chest Pulmonary Embolism (PE) W or WO Contrast  Result Date: 07/24/2022 CLINICAL DATA:  88 year old male with history of increasing chest heaviness and shortness of breath for the past 3 hours. EXAM: CT ANGIOGRAPHY CHEST WITH CONTRAST TECHNIQUE: Multidetector CT imaging of the chest was performed using the standard protocol during bolus administration of intravenous contrast. Multiplanar CT image reconstructions and MIPs were obtained to  evaluate the vascular anatomy. RADIATION DOSE REDUCTION: This exam was performed according to the departmental dose-optimization program which includes automated exposure control, adjustment of the mA and/or kV according to patient size and/or use of iterative reconstruction technique. CONTRAST:  61m OMNIPAQUE IOHEXOL 350 MG/ML SOLN COMPARISON:  Chest CTA 06/20/2022. FINDINGS: Cardiovascular: Previously noted filling defects in the right upper  lobe pulmonary artery branches have resolved, indicative of successful treatment for prior pulmonary embolism. Today's study demonstrates no new filling defects within the pulmonary arterial tree to suggest new pulmonary embolism. Heart size is borderline enlarged. There is no significant pericardial fluid, thickening or pericardial calcification. There is aortic atherosclerosis, as well as atherosclerosis of the great vessels of the mediastinum and the coronary arteries, including calcified atherosclerotic plaque in the left main, left anterior descending, left circumflex and right coronary arteries. Mediastinum/Nodes: No pathologically enlarged mediastinal or hilar lymph nodes. Esophagus is unremarkable in appearance. No axillary lymphadenopathy. Lungs/Pleura: No acute consolidative airspace disease. No pleural effusions. No suspicious appearing pulmonary nodules or masses are noted. Upper Abdomen: 1.4 cm low-attenuation lesion in the central aspect of segment 4A of the liver, compatible with a simple cyst (no imaging follow-up recommended). Aortic atherosclerosis. Musculoskeletal: Chronic compression fractures of T3, T11, T12 and L1 are again noted, most severe at T12 where there is 70% loss of anterior vertebral body height. Post vertebroplasty changes at L1 partially imaged. There are no aggressive appearing lytic or blastic lesions noted in the visualized portions of the skeleton. Review of the MIP images confirms the above findings. IMPRESSION: 1. No acute findings in  the thorax to account for the patient's symptoms. Previously noted right upper lobe pulmonary emboli have resolved compared to prior study 06/20/2022. 2. Aortic atherosclerosis, in addition to left main and three-vessel coronary artery disease. 3. Incidental findings, as above, similar to the prior study. Aortic Atherosclerosis (ICD10-I70.0). Electronically Signed   By: Vinnie Langton M.D.   On: 07/24/2022 05:34    Review Of Systems Constitutional: No fever, chills, weight loss or gain. Eyes: No vision change, wears glasses. No discharge or pain. Ears: No hearing loss, No tinnitus. Respiratory: No asthma, COPD, pneumonias. Positive shortness of breath, positive PE. No hemoptysis. Cardiovascular: Positive chest pain, palpitation, no leg edema. Gastrointestinal: No nausea, vomiting, diarrhea, constipation. No GI bleed. No hepatitis. Genitourinary: No dysuria, hematuria, kidney stone. No incontinance. Neurological: No headache, stroke, seizures.  Psychiatry: No psych facility admission for anxiety, depression, suicide. No detox. Skin: No rash. Musculoskeletal: Positive joint pain, no fibromyalgia. No neck pain, back pain. Lymphadenopathy: No lymphadenopathy. Hematology: No anemia or easy bruising.   Blood pressure (!) 147/74, pulse 100, temperature 98.1 F (36.7 C), temperature source Oral, resp. rate 17, height 6' (1.829 m), weight 89.1 kg, SpO2 98 %. Body mass index is 26.64 kg/m. General appearance: alert, cooperative, appears stated age and mild respiratory distress Head: Normocephalic, atraumatic. Eyes: Blue eyes, pink conjunctiva, corneas clear.  Neck: No adenopathy, no carotid bruit, no JVD, supple, symmetrical, trachea midline and thyroid not enlarged. Resp: Clear to auscultation bilaterally. Cardio: Regular rate and rhythm, S1, S2 normal, II/VI systolic murmur, no click, rub or gallop GI: Soft, non-tender; bowel sounds normal; no organomegaly. Extremities: No edema, cyanosis or  clubbing. Skin: Warm and dry.  Neurologic: Alert and oriented X 3, normal strength. Normal coordination.  Assessment/Plan Acute coronary syndrome CAD Chronic systolic left heart failure HTN HLD CKD, II  Plan: Hold Eliquis IV heparin per pharmacy. Consider cardiac cath in 48 hours.  Time spent: Review of old records, Lab, x-rays, EKG, other cardiac tests, examination, discussion with patient/Doctor/Nurse over 70 minutes.  Birdie Riddle, MD  07/25/2022, 7:31 PM

## 2022-07-25 NOTE — Progress Notes (Signed)
Hydralazine given for hypertension over 150. Improved after dose.   07/25/22 0521 07/25/22 0709  Vitals  Temp 98.4 F (36.9 C)  --   Temp Source Oral  --   BP (!) 164/72 (!) 149/60  MAP (mmHg) 100 86  BP Location Left Arm  --   BP Method Automatic  --

## 2022-07-26 DIAGNOSIS — I5023 Acute on chronic systolic (congestive) heart failure: Secondary | ICD-10-CM | POA: Diagnosis not present

## 2022-07-26 LAB — BASIC METABOLIC PANEL
Anion gap: 7 (ref 5–15)
BUN: 14 mg/dL (ref 8–23)
CO2: 24 mmol/L (ref 22–32)
Calcium: 8.3 mg/dL — ABNORMAL LOW (ref 8.9–10.3)
Chloride: 107 mmol/L (ref 98–111)
Creatinine, Ser: 1.28 mg/dL — ABNORMAL HIGH (ref 0.61–1.24)
GFR, Estimated: 54 mL/min — ABNORMAL LOW (ref >60)
Glucose, Bld: 90 mg/dL (ref 70–99)
Potassium: 4.2 mmol/L (ref 3.5–5.1)
Sodium: 138 mmol/L (ref 135–145)

## 2022-07-26 LAB — APTT
aPTT: 76 seconds — ABNORMAL HIGH (ref 24–36)
aPTT: 98 seconds — ABNORMAL HIGH (ref 24–36)

## 2022-07-26 MED ORDER — SENNOSIDES-DOCUSATE SODIUM 8.6-50 MG PO TABS
2.0000 | ORAL_TABLET | Freq: Two times a day (BID) | ORAL | Status: DC
  Filled 2022-07-26 (×4): qty 2

## 2022-07-26 MED ORDER — SODIUM CHLORIDE 0.9 % IV SOLN: INTRAVENOUS | Status: DC

## 2022-07-26 MED ORDER — SODIUM CHLORIDE 0.9% FLUSH
3.0000 mL | Freq: Two times a day (BID) | INTRAVENOUS | Status: DC
  Administered 2022-07-26 – 2022-07-28 (×5): 3 mL via INTRAVENOUS

## 2022-07-26 MED ORDER — POLYETHYLENE GLYCOL 3350 17 G PO PACK
17.0000 g | PACK | Freq: Two times a day (BID) | ORAL | Status: DC
  Filled 2022-07-26 (×2): qty 1

## 2022-07-26 MED ORDER — SODIUM CHLORIDE 0.9 % IV SOLN: 250.0000 mL | INTRAVENOUS | Status: DC | PRN

## 2022-07-26 MED ORDER — SODIUM CHLORIDE 0.9% FLUSH: 3.0000 mL | INTRAVENOUS | Status: DC | PRN

## 2022-07-26 NOTE — Progress Notes (Signed)
PROGRESS NOTE    [name]  ZDG:[id number] DOB: [birth year] DOA: 07/23/2022 PCP: Hoyt Koch, MD   HPI: 16/M with history of hypertension, BPH, dyslipidemia, chronic back pain presented to the ED with sudden onset dyspnea in 11/23, was diagnosed with small PE and EF at the time was 40%, cardiac workup deferred. -Back in the ED yesterday with sudden onset shortness of breath, chest pressure and heaviness.  -Troponins negative, EKG with nonspecific ST changes  Subjective: Shortness of breath has improved.  Chest pressure is also improved.  Denies any nausea or vomiting.  Assessment and Plan:  Recurrent dyspnea Hypertensive urgency Chest pressure -Similar to admission last month, diagnosed with segmental PE last month, started on Eliquis, echo noted EF of 40%, global hypokinesis, normal RV, cardiac workup deferred at the time -Recurrence of symptoms, CTA negative for dissection/PE, suspect undiagnosed CAD, patient is very independent, drives and takes care of his wife. -EKG with nonspecific changes, troponin negative -Dr. [name] consulted, seen by him last month Plan is for cardiac catheterization tomorrow. Eliquis on hold.  On IV heparin. Continue carvedilol, Imdur, lasix  Recent PE -Not seen on repeat CTA, hold Eliquis, IV heparin for now  Acute on chronic systolic CHF -Appears mildly volume overloaded, add IV Lasix today -Echo 11/23 with EF of 40%, normal RV -Consider further GDMT depending on above workup  History of chronic lymphedema Given IV Lasix yesterday.  Now on oral Lasix.  Compression stockings.  Chronic back pain, spinal stenosis -Continue home regimen of narcotics  Constipation Likely due to narcotics.  Takes Symproic at home which is not available in our formulary.  He is about to take his home medications here.  BPH -Continue Flomax  DVT prophylaxis: On IV heparin  Code Status: DNR Family Communication: Discussed with patient Disposition  Plan: Home pending cardiac workup  Consultants: Cardiology Dr. [name]   Procedures:   Antimicrobials:    Objective: Vitals:   07/25/22 1918 07/25/22 2000 07/26/22 0438 07/26/22 0846  BP: 108/82 111/71 (!) 168/95 120/68  Pulse: (!) 108 99 81   Resp: '18  16 20  '$ Temp: 98 F (36.7 C)  98 F (36.7 C) 97.9 F (36.6 C)  TempSrc: Oral  Oral   SpO2: 93% 95% 96%   Weight:   88.5 kg   Height:        Intake/Output Summary (Last 24 hours) at 07/26/2022 0949 Last data filed at 07/26/2022 0846 Gross per 24 hour  Intake 918.91 ml  Output 1350 ml  Net -431.09 ml    Filed Weights   07/23/22 2009 07/25/22 0029 07/26/22 0438  Weight: 88.5 kg 89.1 kg 88.5 kg    Examination:  General appearance: Awake alert.  In no distress Resp: Clear to auscultation bilaterally.  Normal effort Cardio: S1-S2 is normal regular.  No S3-S4.  No rubs murmurs or bruit GI: Abdomen is soft.  Nontender nondistended.  Bowel sounds are present normal.  No masses organomegaly Extremities: No edema.  Full range of motion of lower extremities. Neurologic: Alert and oriented x3.  No focal neurological deficits.   Data Reviewed:   CBC: Recent Labs  Lab 07/23/22 2038  WBC 7.9  HGB 12.0*  HCT 37.4*  MCV 91.2  PLT 412*    Basic Metabolic Panel: Recent Labs  Lab 07/23/22 2038 07/25/22 0020 07/26/22 0602  NA 135 137 138  K 4.4 3.8 4.2  CL 107 106 107  CO2 19* 21* 24  GLUCOSE 106* 161* 90  BUN  $'15 13 14  'g$ CREATININE 1.15 1.19 1.28*  CALCIUM 8.8* 8.5* 8.3*    GFR: Estimated Creatinine Clearance: 43.8 mL/min (A) (by C-G formula based on SCr of 1.28 mg/dL (H)). Liver Function Tests: Recent Labs  Lab 07/23/22 2038  AST 21  ALT 14  ALKPHOS 92  BILITOT 0.3  PROT 6.2*  ALBUMIN 3.1*     BNP (last 3 results) Recent Labs    01/03/22 1050  PROBNP 266.0*    Lipid Profile: Recent Labs    07/24/22 0240  CHOL 169  HDL 69  LDLCALC 88  TRIG 60  CHOLHDL 2.4     Radiology Studies: No  results found.   Scheduled Meds:  aspirin EC  81 mg Oral Daily   carvedilol  6.25 mg Oral BID WC   fluticasone  1 spray Each Nare q AM   furosemide  20 mg Oral BID    HYDROmorphone (DILAUDID) injection  1 mg Intravenous Once   isosorbide mononitrate  30 mg Oral Daily   Naldemedine Tosylate  0.2 mg Oral q AM   polyethylene glycol  17 g Oral Daily   sodium chloride flush  3 mL Intravenous Q12H   tamsulosin  0.4 mg Oral Daily   Continuous Infusions:  sodium chloride     heparin 1,500 Units/hr (07/25/22 2023)     LOS: 1 day    [facility]   07/26/2022, 9:49 AM

## 2022-07-26 NOTE — TOC Initial Note (Signed)
Transition of Care [facility]) - Initial/Assessment Note    Patient Details  Name: [name] MRN: [id number] Date of Birth: 1931-10-06  Transition of Care [facility]) CM/SW Contact:    Zenon Mayo, RN Phone Number: 07/26/2022, 4:13 PM  Clinical Narrative:                 From home with wife, indep, has PCP, still drives, he states he has a scale at home, and he checks his bp daily, he eats a low sodium diet, he states his niece will transport him home at dc.  Plan is for a heart cath tomorrow.  TOC following.   Expected Discharge Plan: Home/Self Care Barriers to Discharge: Continued Medical Work up   Patient Goals and CMS Choice Patient states their goals for this hospitalization and ongoing recovery are:: return home   Choice offered to / list presented to : NA  Expected Discharge Plan and Services Expected Discharge Plan: Home/Self Care In-house Referral: NA Discharge Planning Services: CM Consult Post Acute Care Choice: NA Living arrangements for the past 2 months: Single Family Home                 DME Arranged: N/A DME Agency: NA       HH Arranged: NA          Prior Living Arrangements/Services Living arrangements for the past 2 months: Single Family Home Lives with:: Spouse Patient language and need for interpreter reviewed:: Yes Do you feel safe going back to the place where you live?: Yes      Need for Family Participation in Patient Care: Yes (Comment) Care giver support system in place?: Yes (comment)   Criminal Activity/Legal Involvement Pertinent to Current Situation/Hospitalization: No - Comment as needed  Activities of Daily Living Home Assistive Devices/Equipment: Grab bars around toilet, Grab bars in shower, Walker (specify type), Other (Comment) (elevator) ADL Screening (condition at time of admission) Patient's cognitive ability adequate to safely complete daily activities?: Yes Is the patient deaf or have difficulty hearing?: No Does the  patient have difficulty seeing, even when wearing glasses/contacts?: No Does the patient have difficulty concentrating, remembering, or making decisions?: No Patient able to express need for assistance with ADLs?: Yes Does the patient have difficulty dressing or bathing?: No Independently performs ADLs?: Yes (appropriate for developmental age) Does the patient have difficulty walking or climbing stairs?: Yes Weakness of Legs: Right Weakness of Arms/Hands: None  Permission Sought/Granted                  Emotional Assessment Appearance:: Appears stated age Attitude/Demeanor/Rapport: Engaged Affect (typically observed): Appropriate Orientation: : Oriented to Self, Oriented to Place, Oriented to  Time, Oriented to Situation Alcohol / Substance Use: Not Applicable Psych Involvement: No (comment)  Admission diagnosis:  CHF (congestive heart failure) ([place]) [I50.9] Hypertensive urgency [I16.0] Chest pain, unspecified type [R07.9] Patient Active Problem List   Diagnosis Date Noted   CHF (congestive heart failure) ([place]) 07/24/2022   Acute pulmonary embolism ([place]) 06/20/2022   Hypokalemia 06/20/2022   Abnormal EKG 06/19/2022   Fracture of twelfth thoracic vertebra ([place]) 01/03/2022   Opioid dependence ([place]) 01/03/2022   Poor balance 01/03/2022   Pedal edema 01/03/2022   Bilateral wrist pain 06/19/2021   Fall 01/11/2021   Orthostatic hypotension    Weakness    Osteoporosis 10/07/2020   Lumbar compression fracture ([place]) 09/07/2020   Greater trochanteric bursitis of both hips 05/04/2020   Left patella fracture 01/19/2020   Arthritis  of left knee 05/07/2019   Pre-operative clearance 79/09/4095   Chronic systolic heart failure ([place]) 08/10/2018   Leg wound, left 03/19/2018   Right arm pain 11/11/2017   Primary osteoarthritis of right foot 08/21/2017   Syncope 02/27/2017   Arthritis of right ankle 12/18/2016   Carpal tunnel syndrome, bilateral 09/04/2016   Leg swelling 08/16/2016    Hand weakness 07/12/2016   Degenerative arthritis of left knee 06/28/2016   Body mass index (bmi) 28.0-28.9, adult 06/18/2016   S/P cervical spinal fusion 06/14/2016   Iron deficiency anemia 03/16/2016   Arthritis of left acromioclavicular joint 01/03/2016   Rotator cuff disorder 01/03/2016   Osteoarthritis of left knee 04/30/2015   Baker's cyst of knee 04/30/2015   Esophageal stricture 04/26/2015   Benign prostatic hyperplasia 02/21/2015   Constipation 02/21/2015   Myalgia and myositis 11/18/2014   Status post lumbar spine operation 06/25/2014   Pseudoarthrosis of cervical spine ([place]) 05/25/2014   Sacral fracture, closed ([place]) 09/23/2013   Spondylolisthesis, congenital 09/02/2013   Polyarticular arthritis 07/19/2013   Spondylosis of lumbosacral spine at multiple levels without myelopathy 04/24/2013   Chronic radicular low back pain 12/15/2012   Gout 02/20/2010   ERECTILE DYSFUNCTION, ORGANIC 02/20/2010   Hyperlipidemia 10/18/2008   CAD (coronary artery disease) 08/18/2007   Essential hypertension 05/09/2007   ALLERGIC RHINITIS 03/27/2007   PCP:  Hoyt Koch, MD Pharmacy:   [id number] [address] [place]Phone: [phone]: [phone] [facility] [address]Phone: [phone]: [phone]    Social Determinants of Health (SDOH) Interventions Alcohol Usage Interventions: Intervention Not Indicated (Score <7) Financial Strain Interventions: Intervention Not Indicated  Readmission Risk Interventions    07/26/2022    4:11 PM  Readmission Risk Prevention Plan  Transportation Screening Complete  PCP or Specialist Appt within 3-5 Days Complete  HRI or Home Care Consult Complete  Palliative Care Screening Not Applicable  Medication Review (RN Care Manager) Complete

## 2022-07-26 NOTE — Progress Notes (Signed)
   07/26/22 1000  Mobility  Activity Ambulated with assistance to bathroom  Level of Assistance Standby assist, set-up cues, supervision of patient - no hands on  Assistive Device Front wheel walker  Distance Ambulated (ft) 10 ft  Activity Response Tolerated well  Mobility Referral Yes  $Mobility charge 1 Mobility   Mobility Specialist Progress Note  Pt requesting to go to the BR. Had no c/o pain throughout ambulation. Left in BR for BM with instructions to pull the string when finished.   Lucious Groves Mobility Specialist  Please contact via SecureChat or Rehab office at [phone]

## 2022-07-26 NOTE — Progress Notes (Signed)
   07/26/22 1121  Mobility  Activity Ambulated with assistance in hallway  Level of Assistance Contact guard assist, steadying assist  Assistive Device Front wheel walker  Distance Ambulated (ft) 350 ft  Activity Response Tolerated well  Mobility Referral Yes  $Mobility charge 1 Mobility   Mobility Specialist Progress Note  Pre-Mobility: 98 HR, 131/66 BP Post-Mobility: 103 HR, 130/63 BP  Pt was EOB and agreeable. Had no c/o pain throughout ambulation. Returned to bed w/ all needs met and call bell in reach.   Lucious Groves Mobility Specialist  Please contact via SecureChat or Rehab office at [phone]

## 2022-07-26 NOTE — Progress Notes (Signed)
   Heart Failure Stewardship Pharmacist Progress Note   PCP: Hoyt Koch, MD PCP-Cardiologist: None    HPI:  88 yo M with PMH of CAD, BPH, chronic HFpEF, PE, HTN, HLD, chronic opioid-dependent back pain s/p back surgery.   Admitted from 11/7-11/10 with acute PE without RV strain. ECHO 11/8 showed LVEF 40% with mild LVH, RV normal. GDMT initiated and plan for cardiac catheterization on 11/10. Ultimately the patient requested discharge in lieu of inpatient work up, planned to follow up with PCP and cardiology after discharge.   He presented to the ED on 12/11 with chest discomfort, shortness of breath, and HTN. CTA without acute findings, PE resolved. Plan for cath tomorrow.  Current HF Medications: Diuretic: furosemide 20 mg PO BID Beta Blocker: carvedilol 6.25 mg BID Other: Imdur 30 mg daily  Prior to admission HF Medications: Diuretic: furosemide 20 mg daily Beta blocker: metoprolol XL 50 mg daily ACE/ARB/ARNI: losartan 50 mg daily  Pertinent Lab Values: Serum creatinine 1.28, BUN 14, Potassium 4.2, Sodium 138, BNP 470.3  Vital Signs: Weight: 195 lbs Blood pressure: 130/70s  Heart rate: 90-100s  I/O: not well documented  Medication Assistance / Insurance Benefits Check: Does the patient have prescription insurance?  Yes Type of insurance plan: [facility] Medicare  Outpatient Pharmacy:  [facility] Is the patient willing to use [facility] at discharge? Yes Is the patient willing to transition their outpatient pharmacy to utilize a [facility] outpatient pharmacy?   Pending    Assessment: 1. Acute on chronic systolic CHF (LVEF 44%). NYHA class II symptoms. - Continue furosemide 20 mg PO BID. Strict I/Os and daily weights. - Continue carvedilol 6.25 mg BID - Losartan on hold, BP has been labile - Consider adding MRA and SGLT2i prior to discharge   Plan: 1) Medication changes recommended at this time: - None pending cath  tomorrow   2) Patient assistance: - None pending  3)  Education  - Initial education completed - Full education to be completed prior to discharge  Kerby Nora, PharmD, BCPS Heart Failure Stewardship Pharmacist Phone [phone]

## 2022-07-26 NOTE — Consult Note (Signed)
Ref: Hoyt Koch, MD   Subjective:  Awake. No chest pain. Off Eliquis.  Objective:  Vital Signs in the last 24 hours: Temp:  [97.9 F (36.6 C)-98.1 F (36.7 C)] 98.1 F (36.7 C) (12/14 1211) Pulse Rate:  [81-108] 85 (12/14 1211) Cardiac Rhythm: Sinus tachycardia (12/14 0846) Resp:  [16-20] 20 (12/14 0846) BP: (108-168)/(68-95) 130/78 (12/14 1211) SpO2:  [93 %-98 %] 96 % (12/14 1211) Weight:  [88.5 kg] 88.5 kg (12/14 0438)  Physical Exam: BP Readings from Last 1 Encounters:  07/26/22 130/78     Wt Readings from Last 1 Encounters:  07/26/22 88.5 kg    Weight change: -0.635 kg Body mass index is 26.45 kg/m. HEENT: [place]/AT, Eyes-Blue, Conjunctiva-Pink, Sclera-Non-icteric Neck: No JVD, No bruit, Trachea midline. Lungs:  Clear, Bilateral. Cardiac:  Regular rhythm, normal S1 and S2, no S3. II/VI systolic murmur. Abdomen:  Soft, non-tender. BS present. Extremities:  1 + lower leg edema is present. No cyanosis. No clubbing.2 + Femorals and radial pulses. CNS: AxOx3, Cranial nerves grossly intact, moves all 4 extremities.  Skin: Warm and dry.   Intake/Output from previous day: 12/13 0701 - 12/14 0700 In: 914.9 [P.O.:813; I.V.:101.9] Out: 1350 [Urine:1350]    Lab Results: BMET    Component Value Date/Time   NA 138 07/26/2022 0602   NA 137 07/25/2022 0020   NA 135 07/23/2022 2038   K 4.2 07/26/2022 0602   K 3.8 07/25/2022 0020   K 4.4 07/23/2022 2038   CL 107 07/26/2022 0602   CL 106 07/25/2022 0020   CL 107 07/23/2022 2038   CO2 24 07/26/2022 0602   CO2 21 (L) 07/25/2022 0020   CO2 19 (L) 07/23/2022 2038   GLUCOSE 90 07/26/2022 0602   GLUCOSE 161 (H) 07/25/2022 0020   GLUCOSE 106 (H) 07/23/2022 2038   BUN 14 07/26/2022 0602   BUN 13 07/25/2022 0020   BUN 15 07/23/2022 2038   CREATININE 1.28 (H) 07/26/2022 0602   CREATININE 1.19 07/25/2022 0020   CREATININE 1.15 07/23/2022 2038   CREATININE 1.00 05/04/2020 1500   CREATININE 1.22 (H) 04/25/2020 1622    CREATININE 1.17 (H) 04/08/2020 1005   CALCIUM 8.3 (L) 07/26/2022 0602   CALCIUM 8.5 (L) 07/25/2022 0020   CALCIUM 8.8 (L) 07/23/2022 2038   GFRNONAA 54 (L) 07/26/2022 0602   GFRNONAA 59 (L) 07/25/2022 0020   GFRNONAA >60 07/23/2022 2038   GFRAA 58 (L) 01/19/2020 0659   GFRAA 60 (L) 04/30/2019 1031   GFRAA 56 (L) 08/12/2018 0230   CBC    Component Value Date/Time   WBC 7.9 07/23/2022 2038   RBC 4.10 (L) 07/23/2022 2038   HGB 12.0 (L) 07/23/2022 2038   HCT 37.4 (L) 07/23/2022 2038   PLT 412 (H) 07/23/2022 2038   MCV 91.2 07/23/2022 2038   MCH 29.3 07/23/2022 2038   MCHC 32.1 07/23/2022 2038   RDW 14.1 07/23/2022 2038   LYMPHSABS 1.8 01/03/2022 1050   MONOABS 0.8 01/03/2022 1050   EOSABS 0.2 01/03/2022 1050   BASOSABS 0.1 01/03/2022 1050   HEPATIC Function Panel Recent Labs    01/03/22 1050 07/23/22 2038  PROT 6.5 6.2*  ALBUMIN 3.8 3.1*  AST 21 21  ALT 16 14  ALKPHOS 112 92   HEMOGLOBIN A1C No results found for: "MPG" CARDIAC ENZYMES Lab Results  Component Value Date   CKTOTAL 54 04/20/2016   CKMB 4.0 12/26/2012   TROPONINI <0.03 08/11/2018   TROPONINI <0.03 08/10/2018   TROPONINI <0.03 08/10/2018  BNP Recent Labs    01/03/22 1050  PROBNP 266.0*   TSH No results for input(s): "TSH" in the last 8760 hours. CHOLESTEROL Recent Labs    07/24/22 0240  CHOL 169    Scheduled Meds:  aspirin EC  81 mg Oral Daily   carvedilol  6.25 mg Oral BID WC   fluticasone  1 spray Each Nare q AM   furosemide  20 mg Oral BID    HYDROmorphone (DILAUDID) injection  1 mg Intravenous Once   isosorbide mononitrate  30 mg Oral Daily   Naldemedine Tosylate  0.2 mg Oral q AM   polyethylene glycol  17 g Oral BID   senna-docusate  2 tablet Oral BID   sodium chloride flush  3 mL Intravenous Q12H   tamsulosin  0.4 mg Oral Daily   Continuous Infusions:  sodium chloride     heparin 1,500 Units/hr (07/26/22 1140)   PRN Meds:.sodium chloride, acetaminophen, clotrimazole,  ondansetron (ZOFRAN) IV, mouth rinse, oxyCODONE, sodium chloride flush  Assessment/Plan: Acute coronary syndrome CAD Chronic systolic left heart failure HTN HLD CKD, II  Plan: Cardiac cath tomorrow. Patient understood procedure and risks.   LOS: 1 day   Time spent including chart review, lab review, examination, discussion with patient : 30 min   Dixie Dials  MD  07/26/2022, 1:44 PM

## 2022-07-26 NOTE — H&P (View-Only) (Signed)
Ref: Hoyt Koch, MD   Subjective:  Awake. No chest pain. Off Eliquis.  Objective:  Vital Signs in the last 24 hours: Temp:  [97.9 F (36.6 C)-98.1 F (36.7 C)] 98.1 F (36.7 C) (12/14 1211) Pulse Rate:  [81-108] 85 (12/14 1211) Cardiac Rhythm: Sinus tachycardia (12/14 0846) Resp:  [16-20] 20 (12/14 0846) BP: (108-168)/(68-95) 130/78 (12/14 1211) SpO2:  [93 %-98 %] 96 % (12/14 1211) Weight:  [88.5 kg] 88.5 kg (12/14 0438)  Physical Exam: BP Readings from Last 1 Encounters:  07/26/22 130/78     Wt Readings from Last 1 Encounters:  07/26/22 88.5 kg    Weight change: -0.635 kg Body mass index is 26.45 kg/m. HEENT: [place]/AT, Eyes-Blue, Conjunctiva-Pink, Sclera-Non-icteric Neck: No JVD, No bruit, Trachea midline. Lungs:  Clear, Bilateral. Cardiac:  Regular rhythm, normal S1 and S2, no S3. II/VI systolic murmur. Abdomen:  Soft, non-tender. BS present. Extremities:  1 + lower leg edema is present. No cyanosis. No clubbing.2 + Femorals and radial pulses. CNS: AxOx3, Cranial nerves grossly intact, moves all 4 extremities.  Skin: Warm and dry.   Intake/Output from previous day: 12/13 0701 - 12/14 0700 In: 914.9 [P.O.:813; I.V.:101.9] Out: 1350 [Urine:1350]    Lab Results: BMET    Component Value Date/Time   NA 138 07/26/2022 0602   NA 137 07/25/2022 0020   NA 135 07/23/2022 2038   K 4.2 07/26/2022 0602   K 3.8 07/25/2022 0020   K 4.4 07/23/2022 2038   CL 107 07/26/2022 0602   CL 106 07/25/2022 0020   CL 107 07/23/2022 2038   CO2 24 07/26/2022 0602   CO2 21 (L) 07/25/2022 0020   CO2 19 (L) 07/23/2022 2038   GLUCOSE 90 07/26/2022 0602   GLUCOSE 161 (H) 07/25/2022 0020   GLUCOSE 106 (H) 07/23/2022 2038   BUN 14 07/26/2022 0602   BUN 13 07/25/2022 0020   BUN 15 07/23/2022 2038   CREATININE 1.28 (H) 07/26/2022 0602   CREATININE 1.19 07/25/2022 0020   CREATININE 1.15 07/23/2022 2038   CREATININE 1.00 05/04/2020 1500   CREATININE 1.22 (H) 04/25/2020 1622    CREATININE 1.17 (H) 04/08/2020 1005   CALCIUM 8.3 (L) 07/26/2022 0602   CALCIUM 8.5 (L) 07/25/2022 0020   CALCIUM 8.8 (L) 07/23/2022 2038   GFRNONAA 54 (L) 07/26/2022 0602   GFRNONAA 59 (L) 07/25/2022 0020   GFRNONAA >60 07/23/2022 2038   GFRAA 58 (L) 01/19/2020 0659   GFRAA 60 (L) 04/30/2019 1031   GFRAA 56 (L) 08/12/2018 0230   CBC    Component Value Date/Time   WBC 7.9 07/23/2022 2038   RBC 4.10 (L) 07/23/2022 2038   HGB 12.0 (L) 07/23/2022 2038   HCT 37.4 (L) 07/23/2022 2038   PLT 412 (H) 07/23/2022 2038   MCV 91.2 07/23/2022 2038   MCH 29.3 07/23/2022 2038   MCHC 32.1 07/23/2022 2038   RDW 14.1 07/23/2022 2038   LYMPHSABS 1.8 01/03/2022 1050   MONOABS 0.8 01/03/2022 1050   EOSABS 0.2 01/03/2022 1050   BASOSABS 0.1 01/03/2022 1050   HEPATIC Function Panel Recent Labs    01/03/22 1050 07/23/22 2038  PROT 6.5 6.2*  ALBUMIN 3.8 3.1*  AST 21 21  ALT 16 14  ALKPHOS 112 92   HEMOGLOBIN A1C No results found for: "MPG" CARDIAC ENZYMES Lab Results  Component Value Date   CKTOTAL 54 04/20/2016   CKMB 4.0 12/26/2012   TROPONINI <0.03 08/11/2018   TROPONINI <0.03 08/10/2018   TROPONINI <0.03 08/10/2018  BNP Recent Labs    01/03/22 1050  PROBNP 266.0*   TSH No results for input(s): "TSH" in the last 8760 hours. CHOLESTEROL Recent Labs    07/24/22 0240  CHOL 169    Scheduled Meds:  aspirin EC  81 mg Oral Daily   carvedilol  6.25 mg Oral BID WC   fluticasone  1 spray Each Nare q AM   furosemide  20 mg Oral BID    HYDROmorphone (DILAUDID) injection  1 mg Intravenous Once   isosorbide mononitrate  30 mg Oral Daily   Naldemedine Tosylate  0.2 mg Oral q AM   polyethylene glycol  17 g Oral BID   senna-docusate  2 tablet Oral BID   sodium chloride flush  3 mL Intravenous Q12H   tamsulosin  0.4 mg Oral Daily   Continuous Infusions:  sodium chloride     heparin 1,500 Units/hr (07/26/22 1140)   PRN Meds:.sodium chloride, acetaminophen, clotrimazole,  ondansetron (ZOFRAN) IV, mouth rinse, oxyCODONE, sodium chloride flush  Assessment/Plan: Acute coronary syndrome CAD Chronic systolic left heart failure HTN HLD CKD, II  Plan: Cardiac cath tomorrow. Patient understood procedure and risks.   LOS: 1 day   Time spent including chart review, lab review, examination, discussion with patient : 30 min   Dixie Dials  MD  07/26/2022, 1:44 PM

## 2022-07-26 NOTE — Progress Notes (Signed)
ANTICOAGULATION CONSULT NOTE   Pharmacy Consult for apixaban > heparin  Indication: pulmonary embolus recent 11/23  Allergies  Allergen Reactions   Amlodipine Swelling and Other (See Comments)    Ankle swelling   Lyrica [Pregabalin] Swelling and Other (See Comments)    Hands and feet swell   Gabapentin Other (See Comments)    Lower extremity edema- knees and feet    Patient Measurements: Height: 6' (182.9 cm) Weight: 88.5 kg (195 lb) IBW/kg (Calculated) : 77.6   Vital Signs: Temp: 98.1 F (36.7 C) (12/14 1211) Temp Source: Oral (12/14 1211) BP: 130/78 (12/14 1211) Pulse Rate: 85 (12/14 1211)  Labs: Recent Labs    07/23/22 2038 07/24/22 0240 07/25/22 0020 07/26/22 0602 07/26/22 1439  HGB 12.0*  --   --   --   --   HCT 37.4*  --   --   --   --   PLT 412*  --   --   --   --   APTT  --   --   --  76* 98*  CREATININE 1.15  --  1.19 1.28*  --   TROPONINIHS 12 14  --   --   --      Estimated Creatinine Clearance: 43.8 mL/min (A) (by C-G formula based on SCr of 1.28 mg/dL (H)).   Assessment: 45 yom admitted with SOB, s/p recent PE and EF 40%.  Patient has been on apixaban - on hold for possible procedures.  Dosing heparin with aptt since apixaban causes false elevation of heparin levels   aPTT remains at goal (98 sec) on infusion at 1500 units/hr. No bleeding noted.  Goal of Therapy:  Heparin level 0.3-0.7 units/ml aPTT 66-102 seconds Monitor platelets by anticoagulation protocol: Yes   Plan:  Continue heparin gtt at 1500 units/hr F/u daily heparin level, aPTT, and CBC  Sherlon Handing, PharmD, BCPS Please see amion for complete clinical pharmacist phone list  07/26/2022 3:52 PM

## 2022-07-26 NOTE — Progress Notes (Signed)
Heart Failure Navigator Progress Note  Assessed for Heart & Vascular TOC clinic readiness.  Patient does not meet criteria due to close cardiology follow up planned by Dr. [name]. Next available HV TOC clinic appt 1/3--please sent message if needed.   Planned LHC 12/15.   Navigator available for reassessment of patient.   Pricilla Holm, MSN, RN Heart Failure Nurse Navigator

## 2022-07-26 NOTE — Progress Notes (Signed)
ANTICOAGULATION CONSULT NOTE   Pharmacy Consult for apixaban > heparin  Indication: pulmonary embolus recent 11/23  Allergies  Allergen Reactions   Amlodipine Swelling and Other (See Comments)    Ankle swelling   Lyrica [Pregabalin] Swelling and Other (See Comments)    Hands and feet swell   Gabapentin Other (See Comments)    Lower extremity edema- knees and feet    Patient Measurements: Height: 6' (182.9 cm) Weight: 88.5 kg (195 lb) IBW/kg (Calculated) : 77.6   Vital Signs: Temp: 98 F (36.7 C) (12/14 0438) Temp Source: Oral (12/14 0438) BP: 168/95 (12/14 0438) Pulse Rate: 81 (12/14 0438)  Labs: Recent Labs    07/23/22 2038 07/24/22 0240 07/25/22 0020 07/26/22 0602  HGB 12.0*  --   --   --   HCT 37.4*  --   --   --   PLT 412*  --   --   --   APTT  --   --   --  76*  CREATININE 1.15  --  1.19 1.28*  TROPONINIHS 12 14  --   --      Estimated Creatinine Clearance: 43.8 mL/min (A) (by C-G formula based on SCr of 1.28 mg/dL (H)).   Medical History: Past Medical History:  Diagnosis Date   ACTINIC KERATOSIS, HEAD 03/23/2008   ALLERGIC RHINITIS 03/27/2007   Arthritis    BENIGN PROSTATIC HYPERTROPHY, WITH URINARY OBSTRUCTION 10/16/2007   CAD 08/18/2007   "patient denies any issues with heart, does not see cardiologist"   Carpal tunnel syndrome, bilateral 09/04/2016   CARPAL TUNNEL SYNDROME, LEFT 04/13/2008   CHF (congestive heart failure) (HCC)    Chronic back pain    Chronic neck pain    Complication of anesthesia    left lower jaw teeth have been injured in the past and he has lost those, now has 2 loose front teeth due to a mouth injury on 01/02/20   DVT (deep venous thrombosis) (HCC)    ERECTILE DYSFUNCTION, ORGANIC 02/20/2010   Gout, unspecified 02/20/2010   Headache(784.0)    migraines hx of- started at age 55- none since 89   History of melanoma    HYPERLIPIDEMIA, WITH HIGH HDL 10/18/2008   HYPERTENSION 05/09/2007   sees Dr. Benay Pillow   KNEE PAIN 10/10/2009    LOW BACK PAIN 03/27/2007   NEOPLASM, MALIGNANT, SKIN, TRUNK 05/09/2007   squamous cell on scalp, sideburn and ear (right). meyloma on truckl.   Patellar tendinitis 10/10/2009   Sciatic nerve pain    on Oxycodone as needed   SKIN CANCER, HX OF 03/27/2007   UNS ADVRS EFF UNS RX MEDICINAL&BIOLOGICAL SBSTNC 08/18/2007   Urinary urgency      sodium chloride     heparin 1,500 Units/hr (07/25/22 2023)     Assessment: 88 yom admitted with SOB, s/p recent PE and EF 40%.  Patient has been on apixaban - on hold for possible procedures.  Dosing heparin with aptt since apixaban causes false elevation of heparin levels   aPTT this morning within goal range at 76 seconds.  No overt bleeding or complications noted.  Goal of Therapy:  Heparin level 0.3-0.7 units/ml aPTT 66-100 seconds Monitor platelets by anticoagulation protocol: Yes   Plan:  Continue heparin gtt at 1500 units/hr Draw daily aptt, heparin level and cbc  Monitor s/s bleeding  F/u plans for cath soon.   [place] Crane, Roylene Reason, [facility] Clinical Pharmacist  07/26/2022 8:44 AM   [facility] pharmacy phone numbers are listed  on [email or web address]

## 2022-07-27 ENCOUNTER — Encounter (HOSPITAL_COMMUNITY)
Admission: EM | Disposition: A | Discharge status: Home / Self Care | Payer: Self-pay | Source: Home / Self Care | Attending: Internal Medicine

## 2022-07-27 ENCOUNTER — Encounter: Payer: Self-pay | Admitting: Internal Medicine

## 2022-07-27 DIAGNOSIS — R079 Chest pain, unspecified: Secondary | ICD-10-CM | POA: Insufficient documentation

## 2022-07-27 HISTORY — PX: LEFT HEART CATH AND CORONARY ANGIOGRAPHY: CATH118249

## 2022-07-27 LAB — CBC
HCT: 35.8 % — ABNORMAL LOW (ref 39.0–52.0)
Hemoglobin: 11.8 g/dL — ABNORMAL LOW (ref 13.0–17.0)
MCH: 29.9 pg (ref 26.0–34.0)
MCHC: 33 g/dL (ref 30.0–36.0)
MCV: 90.6 fL (ref 80.0–100.0)
Platelets: 396 10*3/uL (ref 150–400)
RBC: 3.95 MIL/uL — ABNORMAL LOW (ref 4.22–5.81)
RDW: 14.5 % (ref 11.5–15.5)
WBC: 8.4 10*3/uL (ref 4.0–10.5)
nRBC: 0 % (ref 0.0–0.2)

## 2022-07-27 LAB — BASIC METABOLIC PANEL
Anion gap: 7 (ref 5–15)
BUN: 18 mg/dL (ref 8–23)
CO2: 26 mmol/L (ref 22–32)
Calcium: 8.5 mg/dL — ABNORMAL LOW (ref 8.9–10.3)
Chloride: 105 mmol/L (ref 98–111)
Creatinine, Ser: 1.31 mg/dL — ABNORMAL HIGH (ref 0.61–1.24)
GFR, Estimated: 52 mL/min — ABNORMAL LOW (ref >60)
Glucose, Bld: 99 mg/dL (ref 70–99)
Potassium: 4.3 mmol/L (ref 3.5–5.1)
Sodium: 138 mmol/L (ref 135–145)

## 2022-07-27 LAB — APTT: aPTT: 77 seconds — ABNORMAL HIGH (ref 24–36)

## 2022-07-27 LAB — HEPARIN LEVEL (UNFRACTIONATED): Heparin Unfractionated: 0.84 IU/mL — ABNORMAL HIGH (ref 0.30–0.70)

## 2022-07-27 SURGERY — LEFT HEART CATH AND CORONARY ANGIOGRAPHY
Anesthesia: LOCAL

## 2022-07-27 MED ORDER — VERAPAMIL HCL 2.5 MG/ML IV SOLN
INTRAVENOUS | Status: AC
  Filled 2022-07-27: qty 2

## 2022-07-27 MED ORDER — LABETALOL HCL 5 MG/ML IV SOLN: 10.0000 mg | INTRAVENOUS | Status: AC | PRN

## 2022-07-27 MED ORDER — HEPARIN (PORCINE) IN NACL 1000-0.9 UT/500ML-% IV SOLN
INTRAVENOUS | Status: DC | PRN
  Administered 2022-07-27 (×2): 500 mL

## 2022-07-27 MED ORDER — IOHEXOL 350 MG/ML SOLN
INTRAVENOUS | Status: DC | PRN
  Administered 2022-07-27: 35 mL

## 2022-07-27 MED ORDER — MIDAZOLAM HCL 2 MG/2ML IJ SOLN
INTRAMUSCULAR | Status: DC | PRN
  Administered 2022-07-27: 1 mg via INTRAVENOUS

## 2022-07-27 MED ORDER — LIDOCAINE HCL (PF) 1 % IJ SOLN
INTRAMUSCULAR | Status: AC
  Filled 2022-07-27: qty 30

## 2022-07-27 MED ORDER — MIDAZOLAM HCL 2 MG/2ML IJ SOLN
INTRAMUSCULAR | Status: AC
  Filled 2022-07-27: qty 2

## 2022-07-27 MED ORDER — SODIUM CHLORIDE 0.9% FLUSH
3.0000 mL | Freq: Two times a day (BID) | INTRAVENOUS | Status: DC
  Administered 2022-07-28: 3 mL via INTRAVENOUS

## 2022-07-27 MED ORDER — FENTANYL CITRATE (PF) 100 MCG/2ML IJ SOLN
INTRAMUSCULAR | Status: AC
  Filled 2022-07-27: qty 2

## 2022-07-27 MED ORDER — APIXABAN 5 MG PO TABS: 5.0000 mg | ORAL_TABLET | Freq: Two times a day (BID) | ORAL | Status: DC

## 2022-07-27 MED ORDER — FENTANYL CITRATE (PF) 100 MCG/2ML IJ SOLN
INTRAMUSCULAR | Status: DC | PRN
  Administered 2022-07-27: 25 ug via INTRAVENOUS

## 2022-07-27 MED ORDER — LIDOCAINE HCL (PF) 1 % IJ SOLN
INTRAMUSCULAR | Status: DC | PRN
  Administered 2022-07-27: 12 mL

## 2022-07-27 MED ORDER — OXYCODONE HCL 5 MG PO TABS
10.0000 mg | ORAL_TABLET | Freq: Once | ORAL | Status: AC
  Administered 2022-07-27: 10 mg via ORAL
  Filled 2022-07-27: qty 2

## 2022-07-27 MED ORDER — SODIUM CHLORIDE 0.9 % IV SOLN: 250.0000 mL | INTRAVENOUS | Status: DC | PRN

## 2022-07-27 MED ORDER — SODIUM CHLORIDE 0.9% FLUSH: 3.0000 mL | INTRAVENOUS | Status: DC | PRN

## 2022-07-27 MED ORDER — HEPARIN (PORCINE) IN NACL 1000-0.9 UT/500ML-% IV SOLN
INTRAVENOUS | Status: AC
  Filled 2022-07-27: qty 1000

## 2022-07-27 MED ORDER — HYDRALAZINE HCL 20 MG/ML IJ SOLN: 10.0000 mg | INTRAMUSCULAR | Status: AC | PRN

## 2022-07-27 MED ORDER — HEPARIN SODIUM (PORCINE) 1000 UNIT/ML IJ SOLN
INTRAMUSCULAR | Status: AC
  Filled 2022-07-27: qty 10

## 2022-07-27 MED ORDER — SODIUM CHLORIDE 0.9 % IV BOLUS
500.0000 mL | Freq: Once | INTRAVENOUS | Status: AC
  Administered 2022-07-27: 250 mL via INTRAVENOUS

## 2022-07-27 MED ORDER — APIXABAN 5 MG PO TABS
5.0000 mg | ORAL_TABLET | Freq: Two times a day (BID) | ORAL | Status: DC
  Administered 2022-07-28: 5 mg via ORAL
  Filled 2022-07-27: qty 1

## 2022-07-27 SURGICAL SUPPLY — 9 items
CATH INFINITI 5FR MULTPACK ANG (CATHETERS) IMPLANT
KIT HEART LEFT (KITS) ×1 IMPLANT
PACK CARDIAC CATHETERIZATION (CUSTOM PROCEDURE TRAY) ×1 IMPLANT
SHEATH PINNACLE 5F 10CM (SHEATH) IMPLANT
SHEATH PROBE COVER 6X72 (BAG) IMPLANT
SYR MEDRAD MARK 7 150ML (SYRINGE) ×1 IMPLANT
TRANSDUCER W/STOPCOCK (MISCELLANEOUS) ×1 IMPLANT
WIRE EMERALD 3MM-J .035X150CM (WIRE) IMPLANT
WIRE EMERALD ST .035X150CM (WIRE) IMPLANT

## 2022-07-27 NOTE — Care Management Important Message (Signed)
Important Message  Patient Details  Name: OWAIN ECKERMAN MRN: [id number] Date of Birth: 01-Aug-1932   Medicare Important Message Given:  Yes     Shelda Altes 07/27/2022, 9:14 AM

## 2022-07-27 NOTE — Progress Notes (Signed)
PROGRESS NOTE    [name]  HUT:[id number] DOB: [birth year] DOA: 07/23/2022 PCP: Hoyt Koch, MD   Brief Narrative:  89/M with history of hypertension, BPH, dyslipidemia, chronic back pain presented to the ED with sudden onset dyspnea in 11/23, was diagnosed with small PE and EF at the time was 40%, cardiac workup deferred. -Back in the ED yesterday with sudden onset shortness of breath, chest pressure and heaviness.  -Troponins negative, EKG with nonspecific ST changes  Assessment & Plan:   Active Problems:   Benign prostatic hyperplasia   Hypertensive urgency   Acute on chronic systolic CHF (congestive heart failure) (HCC)   Acute pulmonary embolism (HCC)   Chest pain  Recurrent dyspnea Hypertensive urgency Chest pressure -Similar to admission last month, diagnosed with segmental PE last month, started on Eliquis, echo noted EF of 40%, global hypokinesis, normal RV, cardiac workup deferred at the time -Recurrence of symptoms, CTA negative for dissection/PE, suspect undiagnosed CAD, patient is very independent, drives and takes care of his wife. -EKG with nonspecific changes, troponin negative -Dr. [name] consulted, seen by him last month Plan is for cardiac catheterization today.  He is on bridging heparin, Eliquis on hold.  Continue Coreg, Imdur and Lasix.  Further management per cardiology.   Recent PE -Not seen on repeat CTA, hold Eliquis, IV heparin for now, will resume Eliquis back after cardiac cath.   Acute on chronic systolic CHF -Appears euvolemic.  Continue Lasix 20 mg twice daily. -Echo 11/23 with EF of 40%, normal RV -Consider further GDMT depending on above workup   History of chronic lymphedema Continue oral Lasix and compression stockings.   Chronic back pain, spinal stenosis -Continue home regimen of narcotics   Constipation Resolved.   BPH -Continue Flomax  DVT prophylaxis: heparin gtt   Code Status: DNR  Family Communication:   None present at bedside.  Plan of care discussed with patient in length and he/she verbalized understanding and agreed with it.  Status is: Inpatient Remains inpatient appropriate because: Scheduled for cardiac cath today.   Estimated body mass index is 26.35 kg/m as calculated from the following:   Height as of this encounter: 6' (1.829 m).   Weight as of this encounter: 88.1 kg.    Nutritional Assessment: Body mass index is 26.35 kg/m.Marland Kitchen Seen by dietician.  I agree with the assessment and plan as outlined below: Nutrition Status:        . Skin Assessment: I have examined the patient's skin and I agree with the wound assessment as performed by the wound care RN as outlined below:    Consultants:  Cardiology  Procedures:  None  Antimicrobials:  Anti-infectives (From admission, onward)    None         Subjective: Patient seen and examined.  He has no complaints.  Objective: Vitals:   07/26/22 1704 07/26/22 1931 07/27/22 0455 07/27/22 0744  BP: (!) 161/88 122/61 127/68 (!) 169/85  Pulse: 84 87 82 77  Resp:  '19 18 13  '$ Temp: 98.1 F (36.7 C) 98 F (36.7 C) 98 F (36.7 C) 98 F (36.7 C)  TempSrc: Oral Oral Oral Oral  SpO2:  95% 96% 94%  Weight:   88.1 kg   Height:        Intake/Output Summary (Last 24 hours) at 07/27/2022 1013 Last data filed at 07/27/2022 0852 Gross per 24 hour  Intake 1382.66 ml  Output 2925 ml  Net -1542.34 ml   Filed Weights   07/25/22  1607 07/26/22 0438 07/27/22 0455  Weight: 89.1 kg 88.5 kg 88.1 kg    Examination:  General exam: Appears calm and comfortable  Respiratory system: Clear to auscultation. Respiratory effort normal. Cardiovascular system: S1 & S2 heard, RRR. No JVD, murmurs, rubs, gallops or clicks. No pedal edema. Gastrointestinal system: Abdomen is nondistended, soft and nontender. No organomegaly or masses felt. Normal bowel sounds heard. Central nervous system: Alert and oriented. No focal neurological  deficits. Extremities: Symmetric 5 x 5 power. Skin: No rashes, lesions or ulcers Psychiatry: Judgement and insight appear normal. Mood & affect appropriate.    Data Reviewed: I have personally reviewed following labs and imaging studies  CBC: Recent Labs  Lab 07/23/22 2038 07/27/22 0020  WBC 7.9 8.4  HGB 12.0* 11.8*  HCT 37.4* 35.8*  MCV 91.2 90.6  PLT 412* 371   Basic Metabolic Panel: Recent Labs  Lab 07/23/22 2038 07/25/22 0020 07/26/22 0602 07/27/22 0020  NA 135 137 138 138  K 4.4 3.8 4.2 4.3  CL 107 106 107 105  CO2 19* 21* 24 26  GLUCOSE 106* 161* 90 99  BUN '15 13 14 18  '$ CREATININE 1.15 1.19 1.28* 1.31*  CALCIUM 8.8* 8.5* 8.3* 8.5*   GFR: Estimated Creatinine Clearance: 42.8 mL/min (A) (by C-G formula based on SCr of 1.31 mg/dL (H)). Liver Function Tests: Recent Labs  Lab 07/23/22 2038  AST 21  ALT 14  ALKPHOS 92  BILITOT 0.3  PROT 6.2*  ALBUMIN 3.1*   No results for input(s): "LIPASE", "AMYLASE" in the last 168 hours. No results for input(s): "AMMONIA" in the last 168 hours. Coagulation Profile: No results for input(s): "INR", "PROTIME" in the last 168 hours. Cardiac Enzymes: No results for input(s): "CKTOTAL", "CKMB", "CKMBINDEX", "TROPONINI" in the last 168 hours. BNP (last 3 results) Recent Labs    01/03/22 1050  PROBNP 266.0*   HbA1C: No results for input(s): "HGBA1C" in the last 72 hours. CBG: No results for input(s): "GLUCAP" in the last 168 hours. Lipid Profile: No results for input(s): "CHOL", "HDL", "LDLCALC", "TRIG", "CHOLHDL", "LDLDIRECT" in the last 72 hours. Thyroid Function Tests: No results for input(s): "TSH", "T4TOTAL", "FREET4", "T3FREE", "THYROIDAB" in the last 72 hours. Anemia Panel: No results for input(s): "VITAMINB12", "FOLATE", "FERRITIN", "TIBC", "IRON", "RETICCTPCT" in the last 72 hours. Sepsis Labs: No results for input(s): "PROCALCITON", "LATICACIDVEN" in the last 168 hours.  No results found for this or any  previous visit (from the past 240 hour(s)).   Radiology Studies: No results found.  Scheduled Meds:  aspirin EC  81 mg Oral Daily   carvedilol  6.25 mg Oral BID WC   fluticasone  1 spray Each Nare q AM   furosemide  20 mg Oral BID    HYDROmorphone (DILAUDID) injection  1 mg Intravenous Once   isosorbide mononitrate  30 mg Oral Daily   Naldemedine Tosylate  0.2 mg Oral q AM   polyethylene glycol  17 g Oral BID   senna-docusate  2 tablet Oral BID   sodium chloride flush  3 mL Intravenous Q12H   sodium chloride flush  3 mL Intravenous Q12H   tamsulosin  0.4 mg Oral Daily   Continuous Infusions:  sodium chloride     sodium chloride     sodium chloride 30 mL/hr at 07/26/22 1620   heparin 1,500 Units/hr (07/27/22 0433)     LOS: 2 days   Darliss Cheney, MD [facility]  07/27/2022, 10:13 AM   *Please note that this is  a verbal dictation therefore any spelling or grammatical errors are due to the "[place] [facility]" system interpretation.  Please page via [place] and do not message via secure chat for urgent patient care matters. Secure chat can be used for non urgent patient care matters.  How to contact the [facility] Attending or Consulting provider [place] or covering provider during after hours [place], for this patient?  Check the care team in [facility] and look for a) attending/consulting [facility] provider listed and b) the [facility] team listed. Page or secure chat [phone]. Log into [email or web address] and use [place] universal password to access. If you do not have the password, please contact the hospital operator. Locate the [facility] provider you are looking for under [facility] and page to a number that you can be directly reached. If you still have difficulty reaching the provider, please page the [facility] (Director on Call) for the Hospitalists listed on amion for assistance.

## 2022-07-27 NOTE — Interval H&P Note (Signed)
History and Physical Interval Note:  07/27/2022 3:13 PM  [name]  has presented today for surgery, with the diagnosis of chest pain.  The various methods of treatment have been discussed with the patient and family. After consideration of risks, benefits and other options for treatment, the patient has consented to  Procedure(s): LEFT HEART CATH AND CORONARY ANGIOGRAPHY (N/A) as a surgical intervention.  The patient's history has been reviewed, patient examined, no change in status, stable for surgery.  I have reviewed the patient's chart and labs.  Questions were answered to the patient's satisfaction.     Birdie Riddle

## 2022-07-27 NOTE — Progress Notes (Signed)
Pt arrived from cath left at 1620. Pt had a right femoral heart cath; transparent dressing with gauze in place. Pt A&O x4, on room air. VSS. Sequenced vitals set up. Pt to remain flat until 2010.

## 2022-07-27 NOTE — Progress Notes (Addendum)
[place] for apixaban > heparin  Indication: pulmonary embolus recent 11/23  Allergies  Allergen Reactions   Amlodipine Swelling and Other (See Comments)    Ankle swelling   Lyrica [Pregabalin] Swelling and Other (See Comments)    Hands and feet swell   Gabapentin Other (See Comments)    Lower extremity edema- knees and feet    Patient Measurements: Height: 6' (182.9 cm) Weight: 88.1 kg (194 lb 4.8 oz) IBW/kg (Calculated) : 77.6   Vital Signs: Temp: 98 F (36.7 C) (12/15 0455) Temp Source: Oral (12/15 0455) BP: 127/68 (12/15 0455) Pulse Rate: 82 (12/15 0455)  Labs: Recent Labs    07/25/22 0020 07/26/22 0602 07/26/22 1439 07/27/22 0020  HGB  --   --   --  11.8*  HCT  --   --   --  35.8*  PLT  --   --   --  396  APTT  --  76* 98* 77*  HEPARINUNFRC  --   --   --  0.84*  CREATININE 1.19 1.28*  --  1.31*    Estimated Creatinine Clearance: 42.8 mL/min (A) (by C-G formula based on SCr of 1.31 mg/dL (H)).   Assessment: 67 yom admitted with SOB, s/p recent PE and EF 40%.  Patient has been on apixaban - on hold for possible procedures.  Dosing heparin with aptt since apixaban causes false elevation of heparin levels   aPTT remains at goal (77 sec) on infusion at 1500 units/hr. No bleeding noted.  Goal of Therapy:  Heparin level 0.3-0.7 units/ml aPTT 66-102 seconds Monitor platelets by anticoagulation protocol: Yes   Plan:  Continue heparin gtt at 1500 units/hr Follow-up daily heparin level, aPTT, and CBC Follow-up post LHC today  Sloan Leiter, PharmD, BCPS, BCCCP Please see amion for complete clinical pharmacist phone list  07/27/2022 7:13 AM

## 2022-07-27 NOTE — Progress Notes (Signed)
Pt not complaining of any new symptoms.    07/27/22 1810  Assess: MEWS Score  BP (!) 71/54  MAP (mmHg) (!) 61  Pulse Rate 71  ECG Heart Rate 68  Resp 12  SpO2 95 %  Assess: MEWS Score  MEWS Temp 0  MEWS Systolic 2  MEWS Pulse 0  MEWS RR 1  MEWS LOC 0  MEWS Score 3  MEWS Score Color Yellow  Assess: if the MEWS score is Yellow or Red  Were vital signs taken at a resting state? Yes  Focused Assessment No change from prior assessment  Does the patient meet 2 or more of the SIRS criteria? No  MEWS guidelines implemented *See Row Information* Yes  Take Vital Signs  Increase Vital Sign Frequency  Yellow: Q 2hr X 2 then Q 4hr X 2, if remains yellow, continue Q 4hrs  Escalate  MEWS: Escalate Yellow: discuss with charge nurse/RN and consider discussing with provider and RRT  Notify: Charge Nurse/RN  Name of Charge Nurse/RN Notified Mikle Bosworth RN  Date Charge Nurse/RN Notified 07/27/22  Time Charge Nurse/RN Notified 1810  Provider Notification  Provider Name/Title Pahwani  Date Provider Notified 07/27/22  Time Provider Notified 1815  Method of Notification Page  Notification Reason Change in status (Soft BP)  Provider response See new orders  Assess: SIRS CRITERIA  SIRS Temperature  0  SIRS Pulse 0  SIRS Respirations  0  SIRS WBC 0  SIRS Score Sum  0

## 2022-07-27 NOTE — Plan of Care (Signed)
Pt had a right femoral heart cath this afternoon. Heparin gtt d/c'd. PRN pain meds given for left hip pain.  Problem: Education: Goal: Knowledge of General Education information will improve Description: Including pain rating scale, medication(s)/side effects and non-pharmacologic comfort measures Outcome: Progressing   Problem: Health Behavior/Discharge Planning: Goal: Ability to manage health-related needs will improve Outcome: Progressing   Problem: Clinical Measurements: Goal: Ability to maintain clinical measurements within normal limits will improve Outcome: Progressing Goal: Will remain free from infection Outcome: Progressing Goal: Diagnostic test results will improve Outcome: Progressing Goal: Respiratory complications will improve Outcome: Progressing Goal: Cardiovascular complication will be avoided Outcome: Progressing   Problem: Activity: Goal: Risk for activity intolerance will decrease Outcome: Progressing   Problem: Nutrition: Goal: Adequate nutrition will be maintained Outcome: Progressing   Problem: Coping: Goal: Level of anxiety will decrease Outcome: Progressing   Problem: Elimination: Goal: Will not experience complications related to bowel motility Outcome: Progressing Goal: Will not experience complications related to urinary retention Outcome: Progressing   Problem: Pain Managment: Goal: General experience of comfort will improve Outcome: Progressing   Problem: Safety: Goal: Ability to remain free from injury will improve Outcome: Progressing   Problem: Skin Integrity: Goal: Risk for impaired skin integrity will decrease Outcome: Progressing   Problem: Education: Goal: Understanding of CV disease, CV risk reduction, and recovery process will improve Outcome: Progressing Goal: Individualized Educational Video(s) Outcome: Progressing   Problem: Activity: Goal: Ability to return to baseline activity level will improve Outcome:  Progressing   Problem: Cardiovascular: Goal: Ability to achieve and maintain adequate cardiovascular perfusion will improve Outcome: Progressing Goal: Vascular access site(s) Level 0-1 will be maintained Outcome: Progressing   Problem: Health Behavior/Discharge Planning: Goal: Ability to safely manage health-related needs after discharge will improve Outcome: Progressing

## 2022-07-28 LAB — CBC
HCT: 33.2 % — ABNORMAL LOW (ref 39.0–52.0)
Hemoglobin: 10.5 g/dL — ABNORMAL LOW (ref 13.0–17.0)
MCH: 29.1 pg (ref 26.0–34.0)
MCHC: 31.6 g/dL (ref 30.0–36.0)
MCV: 92 fL (ref 80.0–100.0)
Platelets: 374 10*3/uL (ref 150–400)
RBC: 3.61 MIL/uL — ABNORMAL LOW (ref 4.22–5.81)
RDW: 14.6 % (ref 11.5–15.5)
WBC: 7 10*3/uL (ref 4.0–10.5)
nRBC: 0 % (ref 0.0–0.2)

## 2022-07-28 LAB — BASIC METABOLIC PANEL
Anion gap: 9 (ref 5–15)
BUN: 16 mg/dL (ref 8–23)
CO2: 24 mmol/L (ref 22–32)
Calcium: 8.3 mg/dL — ABNORMAL LOW (ref 8.9–10.3)
Chloride: 103 mmol/L (ref 98–111)
Creatinine, Ser: 1.32 mg/dL — ABNORMAL HIGH (ref 0.61–1.24)
GFR, Estimated: 52 mL/min — ABNORMAL LOW (ref >60)
Glucose, Bld: 105 mg/dL — ABNORMAL HIGH (ref 70–99)
Potassium: 3.7 mmol/L (ref 3.5–5.1)
Sodium: 136 mmol/L (ref 135–145)

## 2022-07-28 MED ORDER — ISOSORBIDE MONONITRATE ER 30 MG PO TB24: 30.0000 mg | ORAL_TABLET | Freq: Every day | ORAL | 0 refills | Status: DC

## 2022-07-28 MED ORDER — AMLODIPINE BESYLATE 5 MG PO TABS: 5.0000 mg | ORAL_TABLET | Freq: Every day | ORAL | 0 refills | Status: DC

## 2022-07-28 NOTE — Discharge Summary (Signed)
Physician Discharge Summary  [name] RWE:[id number] DOB: [birth year] DOA: 07/23/2022  PCP: Hoyt Koch, MD  Admit date: 07/23/2022 Discharge date: 07/28/2022 30 Day Unplanned Readmission Risk Score    Flowsheet Row ED to Hosp-Admission (Current) from 07/23/2022 in [facility]  30 Day Unplanned Readmission Risk Score (%) 29.97 Filed at 07/28/2022 0800       This score is the patient's risk of an unplanned readmission within 30 days of being discharged (0 -100%). The score is based on dignosis, age, lab data, medications, orders, and past utilization.   Low:  0-14.9   Medium: 15-21.9   High: 22-29.9   Extreme: 30 and above          Admitted From: Home Disposition: Home  Recommendations for Outpatient Follow-up:  Follow up with PCP in 1-2 weeks Please obtain BMP/CBC in one week Follow-up with cardiologist/Dr. [name] in 1 month Please follow up with your PCP on the following pending results: Unresulted Labs (From admission, onward)     Start     Ordered   07/28/22 0500  Lipoprotein A (LPA)  Tomorrow morning,   R       Question:  Specimen collection method  Answer:  Lab=Lab collect   07/27/22 1634   07/27/22 0500  CBC  Daily,   R     Question:  Specimen collection method  Answer:  Lab=Lab collect   07/26/22 0803   07/27/22 6195  Basic metabolic panel  Daily,   R     Question:  Specimen collection method  Answer:  Lab=Lab collect   07/26/22 0959              Home Health: None Equipment/Devices: None  Discharge Condition: Stable CODE STATUS: DNR Diet recommendation: Cardiac  Subjective: Seen and examined.  Feels well.  Feels ready to go home.  Brief/Interim Summary: 88/M with history of hypertension, BPH, dyslipidemia, chronic back pain presented to the ED with sudden onset dyspnea in 11/23, was diagnosed with small PE and EF at the time was 40%, cardiac workup deferred. He presented back to the ED this time with sudden onset  shortness of breath, chest pressure and heaviness.  Admitted to hospital service.  Was also having hypertensive urgency.  Cardiology consulted.  Details below.   Recurrent dyspnea Hypertensive urgency Chest pressure  CTA negative for dissection/PE -EKG with nonspecific changes, troponin negative -Dr. [name] consulted, patient underwent cardiac cath, he was found to have noncritical multivessel disease and medical management was recommended.  Prior to cath, he was on heparin, now Eliquis as well as aspirin is resumed per cardiology recommendations.  Blood pressure still is elevated.  For some reason, his losartan was held and he was switched from Toprol-XL to Coreg and Imdur was added.  I personally discussed with Dr. [name], he recommends resuming losartan and Toprol-XL, prescribing Imdur and adding amlodipine 5 mg p.o. daily and he will see the patient in the clinic in 1 month.  Patient is feeling well, he is being discharged stable condition.   Recent PE Resume Eliquis.   Acute on chronic systolic CHF -Appears euvolemic.  Resume home medications/Lasix.   History of chronic lymphedema Continue oral Lasix and compression stockings.   Chronic back pain, spinal stenosis -Continue home regimen of narcotics   Constipation Resolved.   BPH -Continue Flomax  Discharge plan was discussed with patient and/or family member and they verbalized understanding and agreed with it.  Discharge Diagnoses:  Active Problems:  Benign prostatic hyperplasia   Hypertensive urgency   Acute on chronic systolic CHF (congestive heart failure) (HCC)   Acute pulmonary embolism (HCC)   Chest pain    Discharge Instructions   Allergies as of 07/28/2022       Reactions   Amlodipine Swelling, Other (See Comments)   Ankle swelling   Lyrica [pregabalin] Swelling, Other (See Comments)   Hands and feet swell   Gabapentin Other (See Comments)   Lower extremity edema- knees and feet        Medication  List     STOP taking these medications    cephALEXin 500 MG capsule Commonly known as: KEFLEX       TAKE these medications    amLODipine 5 MG tablet Commonly known as: NORVASC Take 1 tablet (5 mg total) by mouth daily.   Aspirin Low Dose 81 MG tablet Generic drug: aspirin EC Take 1 tablet (81 mg total) by mouth daily. Swallow whole.   clotrimazole 1 % cream Commonly known as: LOTRIMIN APPLY TO AFFECTED AREA TWICE A DAY What changed: See the new instructions.   Eliquis DVT/PE Starter Pack Generic drug: Apixaban Starter Pack ('10mg'$  and '5mg'$ ) Take 2 tablets ('10mg'$ ) by mouth twice daily for 7 days, then 1 tablet ('5mg'$ ) twice daily   apixaban 5 MG Tabs tablet Commonly known as: ELIQUIS Take 1 tablet (5 mg total) by mouth 2 (two) times daily.   fluticasone 50 MCG/ACT nasal spray Commonly known as: FLONASE Place 1 spray into both nostrils in the morning.   furosemide 20 MG tablet Commonly known as: LASIX Take 1 tablet (20 mg total) by mouth daily.   isosorbide mononitrate 30 MG 24 hr tablet Commonly known as: IMDUR Take 1 tablet (30 mg total) by mouth daily. Start taking on: July 29, 2022   losartan 50 MG tablet Commonly known as: COZAAR Take 1 tablet (50 mg total) by mouth daily.   metoprolol succinate 50 MG 24 hr tablet Commonly known as: Toprol XL Take 1 tablet (50 mg total) by mouth daily.   Oxycodone HCl 10 MG Tabs Take 10-20 mg by mouth See admin instructions. Take 20 mg by mouth in the morning and evening. May take an additional 10 mg once a day around noontime as needed for unresolved pain.   Symproic 0.2 MG Tabs Generic drug: Naldemedine Tosylate Take 0.2 mg by mouth in the morning.   tamsulosin 0.4 MG Caps capsule Commonly known as: FLOMAX TAKE 1 CAPSULE BY MOUTH EVERY DAY What changed: when to take this        Follow-up Information     Hoyt Koch, MD. Go on 08/14/2022.   Specialty: Internal Medicine Why: '@3'$ :40pm Contact  information: [place] [address] [phone]         Hoyt Koch, MD Follow up in 1 month(s).   Specialty: Internal Medicine Contact information: [place] [address] [phone]         Hoyt Koch, MD Follow up in 1 week(s).   Specialty: Internal Medicine Contact information: [place] [address] [phone]         Dixie Dials, MD Follow up in 1 month(s).   Specialty: Cardiology Contact information: [address] [place] [address] [phone]                Allergies  Allergen Reactions   Amlodipine Swelling and Other (See Comments)    Ankle swelling  Lyrica [Pregabalin] Swelling and Other (See Comments)    Hands and feet swell   Gabapentin Other (See Comments)    Lower extremity edema- knees and feet    Consultations: Cardiology   Procedures/Studies: CARDIAC CATHETERIZATION  Result Date: 07/27/2022   Mid Cx to Dist Cx lesion is 40% stenosed.   Mid RCA lesion is 40% stenosed.   Mid LAD lesion is 50% stenosed.   There is mild to moderate left ventricular systolic dysfunction.   LV end diastolic pressure is normal.   The left ventricular ejection fraction is 35-45% by visual estimate. Medical treatment. Resume Apixaban from tomorrow.   CT Angio Chest Pulmonary Embolism (PE) W or WO Contrast  Result Date: 07/24/2022 CLINICAL DATA:  88 year old male with history of increasing chest heaviness and shortness of breath for the past 3 hours. EXAM: CT ANGIOGRAPHY CHEST WITH CONTRAST TECHNIQUE: Multidetector CT imaging of the chest was performed using the standard protocol during bolus administration of intravenous contrast. Multiplanar CT image reconstructions and MIPs were obtained to evaluate the vascular anatomy. RADIATION DOSE REDUCTION: This exam was performed according to the departmental dose-optimization program which includes automated exposure control,  adjustment of the mA and/or kV according to patient size and/or use of iterative reconstruction technique. CONTRAST:  36m OMNIPAQUE IOHEXOL 350 MG/ML SOLN COMPARISON:  Chest CTA 06/20/2022. FINDINGS: Cardiovascular: Previously noted filling defects in the right upper lobe pulmonary artery branches have resolved, indicative of successful treatment for prior pulmonary embolism. Today's study demonstrates no new filling defects within the pulmonary arterial tree to suggest new pulmonary embolism. Heart size is borderline enlarged. There is no significant pericardial fluid, thickening or pericardial calcification. There is aortic atherosclerosis, as well as atherosclerosis of the great vessels of the mediastinum and the coronary arteries, including calcified atherosclerotic plaque in the left main, left anterior descending, left circumflex and right coronary arteries. Mediastinum/Nodes: No pathologically enlarged mediastinal or hilar lymph nodes. Esophagus is unremarkable in appearance. No axillary lymphadenopathy. Lungs/Pleura: No acute consolidative airspace disease. No pleural effusions. No suspicious appearing pulmonary nodules or masses are noted. Upper Abdomen: 1.4 cm low-attenuation lesion in the central aspect of segment 4A of the liver, compatible with a simple cyst (no imaging follow-up recommended). Aortic atherosclerosis. Musculoskeletal: Chronic compression fractures of T3, T11, T12 and L1 are again noted, most severe at T12 where there is 70% loss of anterior vertebral body height. Post vertebroplasty changes at L1 partially imaged. There are no aggressive appearing lytic or blastic lesions noted in the visualized portions of the skeleton. Review of the MIP images confirms the above findings. IMPRESSION: 1. No acute findings in the thorax to account for the patient's symptoms. Previously noted right upper lobe pulmonary emboli have resolved compared to prior study 06/20/2022. 2. Aortic atherosclerosis, in  addition to left main and three-vessel coronary artery disease. 3. Incidental findings, as above, similar to the prior study. Aortic Atherosclerosis (ICD10-I70.0). Electronically Signed   By: DVinnie LangtonM.D.   On: 07/24/2022 05:34     Discharge Exam: Vitals:   07/28/22 0727 07/28/22 0825  BP: (!) 186/90 (!) 128/59  Pulse: 70   Resp: 13 12  Temp: 98 F (36.7 C)   SpO2: 98%    Vitals:   07/28/22 0600 07/28/22 0700 07/28/22 0727 07/28/22 0825  BP: (!) 152/74 (!) 154/70 (!) 186/90 (!) 128/59  Pulse:  77 70   Resp:  '18 13 12  '$ Temp:   98 F (36.7 C)   TempSrc:   Oral  SpO2:  96% 98%   Weight:      Height:        General: Pt is alert, awake, not in acute distress Cardiovascular: RRR, S1/S2 +, no rubs, no gallops Respiratory: CTA bilaterally, no wheezing, no rhonchi Abdominal: Soft, NT, ND, bowel sounds + Extremities: no edema, no cyanosis    The results of significant diagnostics from this hospitalization (including imaging, microbiology, ancillary and laboratory) are listed below for reference.     Microbiology: No results found for this or any previous visit (from the past 240 hour(s)).   Labs: BNP (last 3 results) Recent Labs    07/24/22 0240  BNP 270.6*   Basic Metabolic Panel: Recent Labs  Lab 07/23/22 2038 07/25/22 0020 07/26/22 0602 07/27/22 0020 07/28/22 0035  NA 135 137 138 138 136  K 4.4 3.8 4.2 4.3 3.7  CL 107 106 107 105 103  CO2 19* 21* '24 26 24  '$ GLUCOSE 106* 161* 90 99 105*  BUN '15 13 14 18 16  '$ CREATININE 1.15 1.19 1.28* 1.31* 1.32*  CALCIUM 8.8* 8.5* 8.3* 8.5* 8.3*   Liver Function Tests: Recent Labs  Lab 07/23/22 2038  AST 21  ALT 14  ALKPHOS 92  BILITOT 0.3  PROT 6.2*  ALBUMIN 3.1*   No results for input(s): "LIPASE", "AMYLASE" in the last 168 hours. No results for input(s): "AMMONIA" in the last 168 hours. CBC: Recent Labs  Lab 07/23/22 2038 07/27/22 0020 07/28/22 0035  WBC 7.9 8.4 7.0  HGB 12.0* 11.8* 10.5*  HCT  37.4* 35.8* 33.2*  MCV 91.2 90.6 92.0  PLT 412* 396 374   Cardiac Enzymes: No results for input(s): "CKTOTAL", "CKMB", "CKMBINDEX", "TROPONINI" in the last 168 hours. BNP: Invalid input(s): "POCBNP" CBG: No results for input(s): "GLUCAP" in the last 168 hours. D-Dimer No results for input(s): "DDIMER" in the last 72 hours. Hgb A1c No results for input(s): "HGBA1C" in the last 72 hours. Lipid Profile No results for input(s): "CHOL", "HDL", "LDLCALC", "TRIG", "CHOLHDL", "LDLDIRECT" in the last 72 hours. Thyroid function studies No results for input(s): "TSH", "T4TOTAL", "T3FREE", "THYROIDAB" in the last 72 hours.  Invalid input(s): "FREET3" Anemia work up No results for input(s): "VITAMINB12", "FOLATE", "FERRITIN", "TIBC", "IRON", "RETICCTPCT" in the last 72 hours. Urinalysis    Component Value Date/Time   COLORURINE YELLOW 01/11/2021 1242   APPEARANCEUR CLEAR 01/11/2021 1242   LABSPEC 1.008 01/11/2021 1242   PHURINE 6.0 01/11/2021 1242   GLUCOSEU NEGATIVE 01/11/2021 1242   GLUCOSEU NEGATIVE 10/03/2018 1036   HGBUR SMALL (A) 01/11/2021 1242   BILIRUBINUR NEGATIVE 01/11/2021 1242   BILIRUBINUR negative 10/17/2018 [place] 01/11/2021 1242   PROTEINUR NEGATIVE 01/11/2021 1242   UROBILINOGEN 0.2 10/17/2018 1555   UROBILINOGEN 0.2 10/03/2018 1036   NITRITE NEGATIVE 01/11/2021 1242   LEUKOCYTESUR NEGATIVE 01/11/2021 1242   Sepsis Labs Recent Labs  Lab 07/23/22 2038 07/27/22 0020 07/28/22 0035  WBC 7.9 8.4 7.0   Microbiology No results found for this or any previous visit (from the past 240 hour(s)).   Time coordinating discharge: Over 30 minutes  SIGNED:   Darliss Cheney, MD  [facility] 07/28/2022, 9:44 AM *Please note that this is a verbal dictation therefore any spelling or grammatical errors are due to the "[place] [employer]" system interpretation. If 7PM-7AM, please contact night-coverage [email or web address]

## 2022-07-28 NOTE — Progress Notes (Signed)
AVS instructions given to pt. No further questions. Pt transported off unit via wheelchair.

## 2022-07-28 NOTE — Progress Notes (Signed)
Subjective:  Doing well denies any chest pain or shortness of breath.  Underwent left cardiac catheterization yesterday tolerated the procedure well.  Cath results noted  Objective:  Vital Signs in the last 24 hours: Temp:  [97.7 F (36.5 C)-98.2 F (36.8 C)] 98 F (36.7 C) (12/16 0727) Pulse Rate:  [0-90] 70 (12/16 0727) Resp:  [12-25] 12 (12/16 0825) BP: (71-186)/(45-91) 128/59 (12/16 0825) SpO2:  [91 %-99 %] 98 % (12/16 0727) Weight:  [87.5 kg] 87.5 kg (12/16 0500)  Intake/Output from previous day: 12/15 0701 - 12/16 0700 In: 1061.5 [P.O.:540; I.V.:521.5] Out: 1800 [Urine:1800] Intake/Output from this shift: Total I/O In: 560 [P.O.:560] Out: 175 [Urine:175]  Physical Exam: Neck: no adenopathy, no carotid bruit, no JVD, and supple, symmetrical, trachea midline Lungs: clear to auscultation bilaterally Heart: regular rate and rhythm, S1, S2 normal, and soft systolic murmur noted no S3 gallop Abdomen: soft, non-tender; bowel sounds normal; no masses,  no organomegaly Extremities: extremities normal, atraumatic, no cyanosis or edema and right groin stable  Lab Results: Recent Labs    07/27/22 0020 07/28/22 0035  WBC 8.4 7.0  HGB 11.8* 10.5*  PLT 396 374   Recent Labs    07/27/22 0020 07/28/22 0035  NA 138 136  K 4.3 3.7  CL 105 103  CO2 26 24  GLUCOSE 99 105*  BUN 18 16  CREATININE 1.31* 1.32*   No results for input(s): "TROPONINI" in the last 72 hours.  Invalid input(s): "CK", "MB" Hepatic Function Panel No results for input(s): "PROT", "ALBUMIN", "AST", "ALT", "ALKPHOS", "BILITOT", "BILIDIR", "IBILI" in the last 72 hours. No results for input(s): "CHOL" in the last 72 hours. No results for input(s): "PROTIME" in the last 72 hours.  Imaging: Imaging results have been reviewed and CARDIAC CATHETERIZATION  Result Date: 07/27/2022   Mid Cx to Dist Cx lesion is 40% stenosed.   Mid RCA lesion is 40% stenosed.   Mid LAD lesion is 50% stenosed.   There is mild  to moderate left ventricular systolic dysfunction.   LV end diastolic pressure is normal.   The left ventricular ejection fraction is 35-45% by visual estimate. Medical treatment. Resume Apixaban from tomorrow.    Cardiac Studies:  Assessment/Plan:  Nonobstructive CAD status post cardiac catheterization Nonischemic cardiomyopathy Compensated systolic congestive heart failure History of recent pulmonary embolism Hypertension Hyperlipidemia Degenerative joint disease Plan Agree with starting ARB may switch to [facility] as outpatient Will add spironolactone and Farxiga as outpatient as blood pressure and renal function tolerates Follow-up with Dr. [name] in 1 to 2 weeks Okay to discharge from cardiac point of view  LOS: 3 days    Charolette Forward 07/28/2022, 10:37 AM

## 2022-07-30 ENCOUNTER — Telehealth: Payer: Self-pay | Admitting: *Deleted

## 2022-07-30 ENCOUNTER — Encounter (HOSPITAL_COMMUNITY): Payer: Self-pay | Admitting: Cardiovascular Disease

## 2022-07-30 NOTE — Patient Outreach (Signed)
Care Coordination [facility] Note Transition Care Management Follow-up Telephone Call Date of discharge and from where: Saturday, 07/28/22, [facility]; shortness of breath/ CHF exacerbation in setting of recent PE How have you been since you were released from the hospital? "I am doing just fine!  I just got home from the grocery store.  I am pretty much weak as a kitten but overall doing good.  My wife is keeping a close eye on me and I have made all the changes to my medicines like they told me to.  I don't have time now to go over my medicines-- I am getting the groceries unpacked.  I know I am doing everything like I am supposed to-- I followed the instructions from the hospital.  I have not called Dr. Sharlet Salina yet to make the appointment, I will do it tomorrow.  I really don't want or need any help making the appointment, I can do it.  I have too much going on right now to worry with it-- I will call her office first thing tomorrow." Any questions or concerns? No  Items Reviewed: Did the pt receive and understand the discharge instructions provided? Yes  Medications obtained and verified? No - patient reports he obtained all new medications and is taking all medications as prescribed, however, he declines/ refuses medication review today Other? No  Any new allergies since your discharge? No  Dietary orders reviewed? Yes Do you have support at home? Yes  wife assisting with care needs as indicated/ needed; patient reports he is essentially independent in self-care  Home Care and Equipment/Supplies: Were home health services ordered? no If so, what is the name of the agency? N/A  Has the agency set up a time to come to the patient's home? not applicable Were any new equipment or medical supplies ordered?  No What is the name of the medical supply agency? N/A Were you able to get the supplies/equipment? not applicable Do you have any questions related to the use of the equipment or supplies? No  N/A  Functional Questionnaire: (I = Independent and D = Dependent) ADLs: I  wife assisting with care needs as indicated  Bathing/Dressing- I  Meal Prep- I  wife assisting with care needs as indicated  Eating- I  Maintaining continence- I  Transferring/Ambulation- I  Managing Meds- I  Follow up appointments reviewed:  PCP Hospital f/u appt confirmed? Yes  Scheduled to see PCP on Tuesday 08/14/22 @ 3:40 pm: discussed need for sooner appointment for lab re-check, as per hospital dischare instructions; patient adamantly declines offer of assistance in scheduling, states he prefers to call himself tomorrow to schedule; he was encouraged to do so and ratinale for sooner hospital follow up appointment discussed [facility] f/u appt confirmed? No  Scheduled to see - on - @ - Are transportation arrangements needed? No  If their condition worsens, is the pt aware to call PCP or go to the Emergency Dept.? Yes Was the patient provided with contact information for the PCP's office or ED? No- patient declines; reports already has contact information for care providers Was to pt encouraged to call back with questions or concerns? Yes  SDOH assessments and interventions completed:   Yes SDOH Interventions Today    Flowsheet Row Most Recent Value  SDOH Interventions   Food Insecurity Interventions Intervention Not Indicated  Transportation Interventions Intervention Not Indicated  [drives self]      Care Coordination Interventions:  Provided education around rationale for sooner PCP  appointment post-recent hospital discharge and encouraged patient to ensure that he calls to schedule asap    Encounter Outcome:  Pt. Visit Completed    Oneta Rack, RN, BSN, CCRN Alumnus RN CM Care Coordination/ Transition of [place] Management [phone]: direct office

## 2022-07-31 LAB — LIPOPROTEIN A (LPA): Lipoprotein (a): 11.1 nmol/L (ref <75.0)

## 2022-08-01 NOTE — Telephone Encounter (Signed)
Pts nephew Randall Hiss has asked can the PPR  documents be faxed to him to sign and he can email or fax them back as he [place] in [place] and can not fill them out in person.  **He has stated when pt was seen at last visit there was some forms filled out that needed to have his information added and his signature. I informed him that he would have to come in. He stated the above about living far away and wanted to know if it can be emailed or faxed?

## 2022-08-07 ENCOUNTER — Telehealth: Payer: Self-pay | Admitting: Internal Medicine

## 2022-08-07 NOTE — Telephone Encounter (Signed)
Patient called and wanted it documented he called about a sharp pain he was having on the left side of his abdomen. He was going to try to get an appointment with sport medicine.

## 2022-08-12 ENCOUNTER — Other Ambulatory Visit: Payer: Self-pay | Admitting: Internal Medicine

## 2022-08-14 ENCOUNTER — Encounter: Payer: Self-pay | Admitting: Internal Medicine

## 2022-08-14 ENCOUNTER — Ambulatory Visit: Payer: Medicare Other | Admitting: Internal Medicine

## 2022-08-14 VITALS — BP 112/68 | HR 89 | Temp 100.4°F | Wt 195.0 lb

## 2022-08-14 DIAGNOSIS — J189 Pneumonia, unspecified organism: Secondary | ICD-10-CM

## 2022-08-14 DIAGNOSIS — I5022 Chronic systolic (congestive) heart failure: Secondary | ICD-10-CM

## 2022-08-14 DIAGNOSIS — I10 Essential (primary) hypertension: Secondary | ICD-10-CM

## 2022-08-14 DIAGNOSIS — Z86711 Personal history of pulmonary embolism: Secondary | ICD-10-CM

## 2022-08-14 MED ORDER — AZITHROMYCIN 250 MG PO TABS: ORAL_TABLET | ORAL | 0 refills | Status: AC

## 2022-08-14 MED ORDER — PREDNISONE 20 MG PO TABS: 40.0000 mg | ORAL_TABLET | Freq: Every day | ORAL | 0 refills | Status: DC

## 2022-08-14 NOTE — Progress Notes (Signed)
   Subjective:   Patient ID: [name], male    DOB: 1931-12-14, 89 y.o.   MRN: [id number]  HPI The patient is an 89 YO man coming in for hospital follow up (in for chest pain and hypertensive urgency, had cath which showed non-obstructive disease, medically managed, BP meds adjusted). Doing okay since being home but having new cough and cold symptoms. Some more SOB and fever today.  PMH, [facility], social history reviewed and updated  Review of Systems  Constitutional:  Positive for activity change, appetite change and fever. Negative for fatigue and unexpected weight change.  HENT:  Positive for congestion. Negative for ear discharge, ear pain, sinus pain, sneezing, sore throat, tinnitus, trouble swallowing and voice change.   Eyes: Negative.   Respiratory:  Positive for cough and shortness of breath. Negative for chest tightness and wheezing.   Cardiovascular: Negative.  Negative for chest pain, palpitations and leg swelling.  Gastrointestinal: Negative.  Negative for abdominal distention, abdominal pain, constipation, diarrhea, nausea and vomiting.  Musculoskeletal: Negative.   Skin: Negative.   Neurological: Negative.   Psychiatric/Behavioral: Negative.      Objective:  Physical Exam Constitutional:      Appearance: He is well-developed.  HENT:     Head: Normocephalic and atraumatic.  Cardiovascular:     Rate and Rhythm: Normal rate and regular rhythm.  Pulmonary:     Effort: Pulmonary effort is normal. No respiratory distress.     Breath sounds: Rhonchi present. No wheezing or rales.  Abdominal:     General: Bowel sounds are normal. There is no distension.     Palpations: Abdomen is soft.     Tenderness: There is no abdominal tenderness. There is no rebound.  Musculoskeletal:     Cervical back: Normal range of motion.  Skin:    General: Skin is warm and dry.  Neurological:     Mental Status: He is alert and oriented to person, place, and time.     Coordination:  Coordination normal.     Vitals:   08/14/22 1556  BP: 112/68  Pulse: 89  Temp: (!) 100.4 F (38 C)  TempSrc: Oral  SpO2: 93%  Weight: 195 lb (88.5 kg)    Assessment & Plan:

## 2022-08-14 NOTE — Patient Instructions (Signed)
We have sent in the z-pack to take 2 pills on day 1 then 1 pill daily on days 2-5.  We have sent in 1 week of prednisone as well.

## 2022-08-16 ENCOUNTER — Encounter: Payer: Self-pay | Admitting: Internal Medicine

## 2022-08-16 DIAGNOSIS — J189 Pneumonia, unspecified organism: Secondary | ICD-10-CM | POA: Insufficient documentation

## 2022-08-16 NOTE — Assessment & Plan Note (Signed)
No flare today but lung infection superimposed. Continue lasix 20 mg daily and imdur 30 mg daily. On statin and aspirin.

## 2022-08-16 NOTE — Assessment & Plan Note (Signed)
With concern for pneumonia and recent hospital stay will cover with azithromycin and prednisone.

## 2022-08-16 NOTE — Assessment & Plan Note (Signed)
Still taking eliquis for 6 months ending May 2024. No clinical signs of bleeding.

## 2022-08-16 NOTE — Assessment & Plan Note (Signed)
BP at goal on new regimen. Continue lasix 20 mg daily, imdur 30 mg daily and amlodipine 5 mg daily and losartan 50 mg daily and metoprolol 50 mg daily.

## 2022-08-23 ENCOUNTER — Ambulatory Visit: Payer: Medicare Other | Admitting: Podiatry

## 2022-08-23 DIAGNOSIS — M216X2 Other acquired deformities of left foot: Secondary | ICD-10-CM

## 2022-08-23 DIAGNOSIS — L02619 Cutaneous abscess of unspecified foot: Secondary | ICD-10-CM

## 2022-08-23 DIAGNOSIS — L84 Corns and callosities: Secondary | ICD-10-CM

## 2022-08-23 DIAGNOSIS — L03119 Cellulitis of unspecified part of limb: Secondary | ICD-10-CM

## 2022-08-23 DIAGNOSIS — L97522 Non-pressure chronic ulcer of other part of left foot with fat layer exposed: Secondary | ICD-10-CM

## 2022-08-23 MED ORDER — DOXYCYCLINE HYCLATE 100 MG PO TABS: 100.0000 mg | ORAL_TABLET | Freq: Two times a day (BID) | ORAL | 0 refills | Status: DC

## 2022-08-23 NOTE — Patient Instructions (Signed)
Wash with soap and water daily. Dry well. Apply a small amount of antibiotic ointment and a bandage daily.   Start the doxycyline  I have ordered a circulation test of your legs. If you do not hear for them about scheduling within the next 1 week, or you have any questions please give [place] a call at [phone].    Monitor for any signs/symptoms of infection. Call the office immediately if any occur or go directly to the emergency room. Call with any questions/concerns.

## 2022-08-26 LAB — WOUND CULTURE
MICRO NUMBER:: 14419660
SPECIMEN QUALITY:: ADEQUATE

## 2022-08-27 ENCOUNTER — Telehealth: Payer: Self-pay | Admitting: Internal Medicine

## 2022-08-27 MED ORDER — AMLODIPINE BESYLATE 5 MG PO TABS: 5.0000 mg | ORAL_TABLET | Freq: Every day | ORAL | 5 refills | Status: DC

## 2022-08-27 MED ORDER — ISOSORBIDE MONONITRATE ER 30 MG PO TB24: 30.0000 mg | ORAL_TABLET | Freq: Every day | ORAL | 5 refills | Status: DC

## 2022-08-27 NOTE — Telephone Encounter (Signed)
[facility]

## 2022-08-27 NOTE — Telephone Encounter (Signed)
MEDICATION: amLODipine (NORVASC) 5 MG tablet  isosorbide mononitrate (IMDUR) 30 MG 24 hr tablet  [id number] [place] [place] [facility] [address]   Comments:   **Let patient know to contact pharmacy at the end of the day to make sure medication is ready. **  ** Please notify patient to allow 48-72 hours to process**  **Encourage patient to contact the pharmacy for refills or they can [facility]

## 2022-08-27 NOTE — Progress Notes (Signed)
Subjective: Chief Complaint  Patient presents with   Callouses    Left foot, near 5th digit    89 year old male presents the office today for concerns of painful calluses reform in the left foot pointing to submetatarsal 5.  He is not tender to walk.  He has not seen any drainage or pus.  No swelling or redness.  He has not had any fevers or chills.  No other concerns.  Objective: AAO x3, NAD DP/PT pulses palpable bilaterally, CRT less than 3 seconds On the left foot submetatarsal 5 is a hyperkeratotic lesion with some dried blood with significant old blister just adjacent and around surrounding the callus.  I was able to debride this today and there was purulent drainage coming underneath this.  I debrided the tissue, see procedure note below down to healthy, granular tissue.  There is no probing to bone, amount or tunneling.  There is no fluctuance or crepitation.  Prior metatarsal head plantar with atrophy of the fat pad. No pain with calf compression, swelling, warmth, erythema  Assessment: 43 old male with ulceration left foot, abscess; prominent metatarsal head  Plan: -All treatment options discussed with the patient including all alternatives, risks, complications.  -Today I sharply debrided the callus to reveal the underlying abscess.  I utilized a #312 with scalpel to sharply debride all nonviable, infected, devitalized tissue to promote wound healing.  Prior to debridement not able to measure the wound after debridement measured 2 x 1 cm.  There is no probing to bone although this close.  After debrided there is no further purulence.  I irrigated with saline.  There is no significant blood loss.  Silvadene was applied followed by dressing.  Recommended continue daily dressing changes with a small amount of antibiotic ointment.  Will start doxycycline. -Offloading given the metatarsal head prominence and atrophy the fat pad. -ABI ordered -Monitor for any clinical signs or symptoms of  infection and directed to call the office immediately should any occur or go to the ER.  *x-ray left foot next appointment   Trula Slade DPM

## 2022-10-22 ENCOUNTER — Telehealth: Payer: Self-pay | Admitting: Internal Medicine

## 2022-10-22 ENCOUNTER — Encounter: Payer: Self-pay | Admitting: Internal Medicine

## 2022-10-22 ENCOUNTER — Ambulatory Visit: Payer: Medicare Other | Admitting: Internal Medicine

## 2022-10-22 VITALS — BP 120/72 | HR 90 | Temp 98.6°F | Ht 72.0 in | Wt 191.0 lb

## 2022-10-22 DIAGNOSIS — M130 Polyarthritis, unspecified: Secondary | ICD-10-CM | POA: Diagnosis not present

## 2022-10-22 DIAGNOSIS — M25531 Pain in right wrist: Secondary | ICD-10-CM | POA: Diagnosis not present

## 2022-10-22 DIAGNOSIS — M25532 Pain in left wrist: Secondary | ICD-10-CM

## 2022-10-22 NOTE — Telephone Encounter (Signed)
Patient wants you to refer him to the hand specialist that he saw before - he does not remember the name of the doctor - please call patient and advise.  Patient's number:  [phone]

## 2022-10-22 NOTE — Progress Notes (Signed)
   Subjective:   Patient ID: [name], male    DOB: 10-25-1931, 89 y.o.   MRN: [id number]  HPI The patient is an 89 YO man comigng in for chronic concerns.   Review of Systems  Constitutional: Negative.   HENT: Negative.    Eyes: Negative.   Respiratory:  Negative for cough, chest tightness and shortness of breath.   Cardiovascular:  Negative for chest pain, palpitations and leg swelling.  Gastrointestinal:  Negative for abdominal distention, abdominal pain, constipation, diarrhea, nausea and vomiting.  Musculoskeletal:  Positive for arthralgias, back pain and myalgias.  Skin: Negative.   Neurological:  Positive for numbness.  Psychiatric/Behavioral: Negative.      Objective:  Physical Exam Constitutional:      Appearance: He is well-developed.  HENT:     Head: Normocephalic and atraumatic.  Cardiovascular:     Rate and Rhythm: Normal rate and regular rhythm.  Pulmonary:     Effort: Pulmonary effort is normal. No respiratory distress.     Breath sounds: Normal breath sounds. No wheezing or rales.  Abdominal:     General: Bowel sounds are normal. There is no distension.     Palpations: Abdomen is soft.     Tenderness: There is no abdominal tenderness. There is no rebound.  Musculoskeletal:        General: Tenderness and deformity present.     Cervical back: Normal range of motion.  Skin:    General: Skin is warm and dry.  Neurological:     Mental Status: He is alert and oriented to person, place, and time.     Coordination: Coordination normal.     Vitals:   10/22/22 1503  BP: 120/72  Pulse: 90  Temp: 98.6 F (37 C)  TempSrc: Oral  SpO2: 96%  Weight: 191 lb (86.6 kg)  Height: 6' (1.829 m)    Assessment & Plan:  Visit time 20 minutes in face to face communication with patient and coordination of care, additional 5 minutes spent in record review, coordination or care, ordering tests, communicating/referring to other healthcare professionals, documenting in  medical records all on the same day of the visit for total time 25 minutes spent on the visit.

## 2022-10-22 NOTE — Patient Instructions (Signed)
We will get you back in with the hand surgeon

## 2022-10-23 ENCOUNTER — Telehealth: Payer: Self-pay | Admitting: Internal Medicine

## 2022-10-23 NOTE — Telephone Encounter (Signed)
Patient was referred to a hand surgeon, but they have no openings to see him until 11/05/22. He would like to know if there is anyone else he can see sooner. Best callback number is [phone].

## 2022-10-25 NOTE — Telephone Encounter (Signed)
I do not know this information.

## 2022-10-26 NOTE — Telephone Encounter (Signed)
See previous msg pt will see PA next week at [place].Marland KitchenJohny [name]

## 2022-10-26 NOTE — Assessment & Plan Note (Signed)
With large cyst left wrist which likes needs resection and likely some component of either cervical radiculopathy or carpal tunnel. He wishes referral to hand surgeon for resection and assessment for treatment options for chronic pain and numbness in the hands.

## 2022-10-26 NOTE — Telephone Encounter (Signed)
Called pt he states the appt is all set-up w/ Dr. Marlou Sa PA for next week.  He will be going to [place].Marland KitchenJohny [name]

## 2022-10-29 ENCOUNTER — Ambulatory Visit: Payer: Medicare Other | Admitting: Surgical

## 2022-10-29 DIAGNOSIS — G5602 Carpal tunnel syndrome, left upper limb: Secondary | ICD-10-CM

## 2022-10-31 ENCOUNTER — Telehealth: Payer: Self-pay | Admitting: Internal Medicine

## 2022-10-31 ENCOUNTER — Telehealth: Payer: Self-pay | Admitting: Surgical

## 2022-10-31 NOTE — Telephone Encounter (Signed)
Patient was seen by ortho for his hand and they said he may have carpal tunnel. He was also seen by PCP on 10/22/22 for the same issue. Patient wants to know if PCP can prescribe prednisone for it. Ortho will not fill it for him. They said he needed to contact his PCP. [facility] [address] Best callback is [phone].

## 2022-10-31 NOTE — Telephone Encounter (Signed)
Called and advised pt. He stated understanding. He will contact his pcp

## 2022-10-31 NOTE — Telephone Encounter (Signed)
Patient called needing Rx refilled Prednisone. [facility] [address]. The number to contact patient is [phone]

## 2022-10-31 NOTE — Telephone Encounter (Signed)
I'm not sure what he needs this for.  He last had this prescribed by his PCP, I'd recommend he reach out to her

## 2022-11-01 NOTE — Telephone Encounter (Signed)
Prednisone does not typically treat carpal tunnel. I recommend carpal tunnel braces to help. Was ortho able to do an injection in his wrists?

## 2022-11-01 NOTE — Telephone Encounter (Signed)
Called ortho but was put on hold for to long will have to call back

## 2022-11-03 ENCOUNTER — Encounter: Payer: Self-pay | Admitting: Surgical

## 2022-11-03 NOTE — Progress Notes (Signed)
Office Visit Note   Patient: [name]           Date of Birth: 1931/10/17           MRN: [id number] Visit Date: 10/29/2022 Requested by: Hoyt Koch, MD [address] [place],   [id number] PCP: Hoyt Koch, MD  Subjective: Chief Complaint  Patient presents with   Right Wrist - Pain   Left Wrist - Pain    HPI: [name] is a 89 y.o. male who presents to the office reporting left hand and wrist pain.  Patient complains of numbness and tingling in the left hand and fingers primarily in the median nerve distribution on the palmar aspect of the thumb, index, middle finger.  He has had this going on for years and it causes pain in addition to the numbness and tingling.  He also has area of soft tissue swelling on the volar aspect of the wrist just proximal to the wrist crease.  He has had this for more than 10 years by his history.  He has had it aspirated previously by sports med doctor but unfortunately they are only able to get about half the fluid out and it returned within several days..                ROS: All systems reviewed are negative as they relate to the chief complaint within the history of present illness.  Patient denies fevers or chills.  Assessment & Plan: Visit Diagnoses:  1. Left carpal tunnel syndrome     Plan: 63-year-old male who presents for evaluation of left hand and wrist pain.  He has history of left wrist osteoarthritis that was demonstrated on radiographs about 6 months ago.  Severe arthritis of the radiocarpal, STT, first CMC joints.  He has soft tissue swelling proximal to the wrist which has been aspirated in the past but without any long-term success.  Last aspiration attempt was by Dr. Glennon Mac in August 2023.  Most of his symptoms seem related to carpal tunnel syndrome of the left hand.  He wants this definitively addressed.  Plan to further evaluate with nerve conduction study of the left upper extremity to evaluate for  left carpal tunnel syndrome.  Follow-up with Dr. Marlou Sa after this to discuss surgical intervention.  Follow-Up Instructions: No follow-ups on file.   Orders:  Orders Placed This Encounter  Procedures   Ambulatory referral to Physical Medicine Rehab   No orders of the defined types were placed in this encounter.     Procedures: No procedures performed   Clinical Data: No additional findings.  Objective: Vital Signs: There were no vitals taken for this visit.  Physical Exam:  Constitutional: Patient appears well-developed HEENT:  Head: Normocephalic Eyes:EOM are normal Neck: Normal range of motion Cardiovascular: Normal rate Pulmonary/chest: Effort normal Neurologic: Patient is alert Skin: Skin is warm Psychiatric: Patient has normal mood and affect  Ortho Exam: Ortho exam demonstrates left hand and wrist with no evidence of cellulitis.  2+ radial pulse of the left upper extremity.  Intact EPL, FPL, finger abduction.  Significantly reduced wrist range of motion in extension and flexion with tenderness diffusely throughout the wrist and swelling of the left wrist compared with the right.  Large area of soft tissue swelling that is not mobile significantly.  This area is mildly tender.  Specialty Comments:  No specialty comments available.  Imaging: No results found.   PMFS History: Patient Active Problem List  Diagnosis Date Noted   HCAP (healthcare-associated pneumonia) 08/16/2022   Chest pain 07/27/2022   CHF (congestive heart failure) ([place]) 07/24/2022   Hx pulmonary embolism 06/20/2022   Hypokalemia 06/20/2022   Abnormal EKG 06/19/2022   Fracture of twelfth thoracic vertebra (HCC) 01/03/2022   Opioid dependence ([place]) 01/03/2022   Poor balance 01/03/2022   Pedal edema 01/03/2022   Bilateral wrist pain 06/19/2021   Fall 01/11/2021   Orthostatic hypotension    Weakness    Osteoporosis 10/07/2020   Lumbar compression fracture ([place]) 09/07/2020   Greater  trochanteric bursitis of both hips 05/04/2020   Left patella fracture 01/19/2020   Arthritis of left knee 05/07/2019   Pre-operative clearance 04/28/2019   Acute on chronic systolic CHF (congestive heart failure) ([place]) 08/10/2018   Leg wound, left 03/19/2018   Right arm pain 11/11/2017   Primary osteoarthritis of right foot 08/21/2017   Syncope 02/27/2017   Arthritis of right ankle 12/18/2016   Carpal tunnel syndrome, bilateral 09/04/2016   Leg swelling 08/16/2016   Hand weakness 07/12/2016   Degenerative arthritis of left knee 06/28/2016   Body mass index (bmi) 28.0-28.9, adult 06/18/2016   S/P cervical spinal fusion 06/14/2016   Iron deficiency anemia 03/16/2016   Arthritis of left acromioclavicular joint 01/03/2016   Rotator cuff disorder 01/03/2016   Osteoarthritis of left knee 04/30/2015   Baker's cyst of knee 04/30/2015   Esophageal stricture 04/26/2015   Benign prostatic hyperplasia 02/21/2015   Constipation 02/21/2015   Myalgia and myositis 11/18/2014   Status post lumbar spine operation 06/25/2014   Pseudoarthrosis of cervical spine ([place]) 05/25/2014   Sacral fracture, closed ([place]) 09/23/2013   Spondylolisthesis, congenital 09/02/2013   Polyarticular arthritis 07/19/2013   Spondylosis of lumbosacral spine at multiple levels without myelopathy 04/24/2013   Chronic radicular low back pain 12/15/2012   Gout 02/20/2010   ERECTILE DYSFUNCTION, ORGANIC 02/20/2010   Hyperlipidemia 10/18/2008   CAD (coronary artery disease) 08/18/2007   Essential hypertension 05/09/2007   ALLERGIC RHINITIS 03/27/2007   Past Medical History:  Diagnosis Date   ACTINIC KERATOSIS, HEAD 03/23/2008   ALLERGIC RHINITIS 03/27/2007   Arthritis    BENIGN PROSTATIC HYPERTROPHY, WITH URINARY OBSTRUCTION 10/16/2007   CAD 08/18/2007   "patient denies any issues with heart, does not see cardiologist"   Carpal tunnel syndrome, bilateral 09/04/2016   CARPAL TUNNEL SYNDROME, LEFT 04/13/2008   CHF (congestive  heart failure) (HCC)    Chronic back pain    Chronic neck pain    Complication of anesthesia    left lower jaw teeth have been injured in the past and he has lost those, now has 2 loose front teeth due to a mouth injury on 01/02/20   DVT (deep venous thrombosis) (HCC)    ERECTILE DYSFUNCTION, ORGANIC 02/20/2010   Gout, unspecified 02/20/2010   Headache(784.0)    migraines hx of- started at age 13- none since 89   History of melanoma    HYPERLIPIDEMIA, WITH HIGH HDL 10/18/2008   HYPERTENSION 05/09/2007   sees Dr. Benay Pillow   KNEE PAIN 10/10/2009   LOW BACK PAIN 03/27/2007   NEOPLASM, MALIGNANT, SKIN, TRUNK 05/09/2007   squamous cell on scalp, sideburn and ear (right). meyloma on truckl.   Patellar tendinitis 10/10/2009   Sciatic nerve pain    on Oxycodone as needed   SKIN CANCER, HX OF 03/27/2007   UNS ADVRS EFF UNS RX MEDICINAL&BIOLOGICAL SBSTNC 08/18/2007   Urinary urgency     Family History  Problem Relation Age of  Onset   Dementia Mother    Anemia Father    Heart disease Father    Stroke Sister        ICH   Lung disease Sister     Past Surgical History:  Procedure Laterality Date   APPENDECTOMY  [id number]   APPLICATION OF WOUND VAC Left 01/19/2020   Procedure: Application Of Wound Vac;  Surgeon: [name], MD;  Location: [facility];  Service: Orthopedics;  Laterality: Left;   BLEPHAROPLASTY     CARPAL TUNNEL RELEASE Right    CHOLECYSTECTOMY     ESOPHAGOGASTRODUODENOSCOPY (EGD) WITH PROPOFOL N/A 04/26/2015   Procedure: ESOPHAGOGASTRODUODENOSCOPY (EGD) WITH PROPOFOL;  Surgeon: Inda Castle, MD;  Location: [facility];  Service: Endoscopy;  Laterality: N/A;   EYE SURGERY     bilateral cataracts removed and vitrectomies   JOINT REPLACEMENT Right    knee   KNEE ARTHROSCOPY     KYPHOPLASTY N/A 06/10/2020   Procedure: Thoracic Twelve, Lumbar One, Lumbar Two Kyphoplasty;  Surgeon: Ashok Pall, MD;  Location: [facility];  Service: Neurosurgery;  Laterality: N/A;  3C/RM 21    KYPHOPLASTY N/A 09/07/2020   Procedure: Lumbar three KYPHOPLASTY;  Surgeon: Ashok Pall, MD;  Location: [facility];  Service: Neurosurgery;  Laterality: N/A;   LAMINECTOMY     LATERAL FUSION LUMBAR SPINE, TRANSVERSE     LEFT HEART CATH AND CORONARY ANGIOGRAPHY N/A 07/27/2022   Procedure: LEFT HEART CATH AND CORONARY ANGIOGRAPHY;  Surgeon: Dixie Dials, MD;  Location: [facility];  Service: Cardiovascular;  Laterality: N/A;   LUMBAR LAMINECTOMY/DECOMPRESSION MICRODISCECTOMY Left 11/03/2012   Procedure: LUMBAR LAMINECTOMY/DECOMPRESSION MICRODISCECTOMY 1 LEVEL;  Surgeon: Hosie Spangle, MD;  Location: [facility];  Service: Neurosurgery;  Laterality: Left;  LEFT L5S1 laminotomy foraminotomy and possible microdiskectomy   MELANOMA EXCISION     scalp   NASAL SINUS SURGERY     ORIF PATELLA Left 01/19/2020   Procedure: LEFT OPEN REDUCTION INTERNAL (ORIF) FIXATION PATELLA WITH AUTO GRAFTING USING HAMSTRING REINFORCEMENT;  Surgeon: [name], MD;  Location: [facility];  Service: Orthopedics;  Laterality: Left;   POSTERIOR CERVICAL FUSION/FORAMINOTOMY Right 10/01/2012   Procedure: POSTERIOR CERVICAL FUSION/FORAMINOTOMY LEVEL 1;  Surgeon: Hosie Spangle, MD;  Location: [facility];  Service: Neurosurgery;  Laterality: Right;  Right Cervical seven thoracic one Cervical laminectomy/foraminotomy with posterior cervical arthrodesis    POSTERIOR LUMBAR FUSION N/A 05/23/2013   Procedure: exploration of lumbar wound, explantation of left interbody implant.;  Surgeon: Hosie Spangle, MD;  Location: [facility];  Service: Neurosurgery;  Laterality: N/A;   RECTAL SURGERY     Fissure   TOTAL KNEE ARTHROPLASTY Left 05/07/2019   Procedure: LEFT TOTAL KNEE ARTHROPLASTY;  Surgeon: [name], MD;  Location: [facility];  Service: Orthopedics;  Laterality: Left;   VITRECTOMY     right and left 2013   Social History   Occupational History   Occupation: Retired  Tobacco Use   Smoking status: Former     Packs/day: 1.00    Years: 33.00    Additional pack years: 0.00    Total pack years: 33.00    Types: Cigarettes    Quit date: 09/29/1981    Years since quitting: 41.1   Smokeless tobacco: Never  Vaping Use   Vaping Use: Never used  Substance and Sexual Activity   Alcohol use: Not Currently    Comment: last drink 07/19/22. finishes 1/2L per week dark rum.   Drug use: No   Sexual  activity: Not Currently    Partners: Female    Comment: Married

## 2022-11-08 ENCOUNTER — Telehealth: Payer: Self-pay | Admitting: Internal Medicine

## 2022-11-08 NOTE — Telephone Encounter (Signed)
Pt called wanted to know how long he would have to stay on this medication or can he get a substitute cheaper because he said the medication its getting too expensive for him. apixaban (ELIQUIS) 5 MG TABS tablet   Can someone please call pt back with update

## 2022-11-12 NOTE — Telephone Encounter (Signed)
Will need to take through May 2024 and then we would assess and likely he could come off then.

## 2022-11-13 NOTE — Telephone Encounter (Signed)
Notified pt w/MD response.../lmb 

## 2022-11-14 ENCOUNTER — Other Ambulatory Visit: Payer: Self-pay | Admitting: Internal Medicine

## 2022-11-16 ENCOUNTER — Ambulatory Visit (INDEPENDENT_AMBULATORY_CARE_PROVIDER_SITE_OTHER): Payer: Medicare Other | Admitting: Physical Medicine and Rehabilitation

## 2022-11-16 DIAGNOSIS — R202 Paresthesia of skin: Secondary | ICD-10-CM

## 2022-11-16 DIAGNOSIS — M79642 Pain in left hand: Secondary | ICD-10-CM | POA: Diagnosis not present

## 2022-11-16 DIAGNOSIS — M542 Cervicalgia: Secondary | ICD-10-CM | POA: Diagnosis not present

## 2022-11-16 DIAGNOSIS — G894 Chronic pain syndrome: Secondary | ICD-10-CM

## 2022-11-16 DIAGNOSIS — Z981 Arthrodesis status: Secondary | ICD-10-CM | POA: Diagnosis not present

## 2022-11-16 NOTE — Progress Notes (Signed)
Functional Pain Scale - descriptive words and definitions  Moderate (4)   Constantly aware of pain, can complete ADLs with modification/sleep marginally affected at times/passive distraction is of no use, but active distraction gives some relief. Moderate range order  Average Pain 6  Left handed  L Wrist pain and numbness. Difficultly buttoning shirt and does have much feeling in hand/ fingers. Hx of severe neuropathy 4 yrs ago.

## 2022-11-19 ENCOUNTER — Ambulatory Visit: Payer: Medicare Other | Admitting: Podiatry

## 2022-11-19 DIAGNOSIS — L03119 Cellulitis of unspecified part of limb: Secondary | ICD-10-CM | POA: Diagnosis not present

## 2022-11-19 DIAGNOSIS — L97522 Non-pressure chronic ulcer of other part of left foot with fat layer exposed: Secondary | ICD-10-CM | POA: Diagnosis not present

## 2022-11-19 DIAGNOSIS — L02619 Cutaneous abscess of unspecified foot: Secondary | ICD-10-CM | POA: Diagnosis not present

## 2022-11-19 DIAGNOSIS — M216X2 Other acquired deformities of left foot: Secondary | ICD-10-CM | POA: Diagnosis not present

## 2022-11-19 MED ORDER — DOXYCYCLINE HYCLATE 100 MG PO TABS: 100.0000 mg | ORAL_TABLET | Freq: Two times a day (BID) | ORAL | 0 refills | Status: DC

## 2022-11-19 NOTE — Progress Notes (Unsigned)
Subjective: Chief Complaint  Patient presents with   Callouses   89 year old male presents the office for above concerns.  States he has a painful spot on his left foot submetatarsal 5 region.  Associated.  Noticed a blister to the area.  No other treatment has been self treating.  Denies any fevers or chills.  No drainage or pus noted today.  Objective: AAO x3, NAD DP/PT pulses palpable bilaterally, CRT less than 3 seconds Hyperkeratotic lesion with dried blood present left foot submetatarsal 5 there is a blister just adjacent to this on the lateral aspect of the fifth metatarsal head.  Upon debridement there is no drainage or pus there is small superficial renal wound without any probing, and or tunneling noted today.  Not able to measure the ulcer prior to debridement given callus.  After debridement there was measured 0.2 x 0.2 x 0.1 cm.  Localized edema and erythema present.  There is no fluctuance or crepitation.  No malodor.  Prominence of metatarsal head plantarly with atrophy of fat pad. No pain with calf compression, swelling, warmth, erythema  Assessment: 89 year old male with prominent metatarsal head, superficial ulcer left foot  Plan: -All treatment options discussed with the patient including all alternatives, risks, complications.  -Medically necessary wound debridement was performed of the area.  Prior to debridement is not able to measure the wound.  I utilized a #312 with scalpel to help debride nonviable, devitalized tissue in order to promote wound healing.  I debrided the ulcer down to healthy, granular tissue.  There is no significant blood loss.  I was also able to debride some of the loose tissue from where the blister was.  There is no drainage to culture.  Small and Betadine was applied followed by dressing.  Continue with daily dressing changes with antibiotic ointment.  Pre and post measurements are noted above. -Offloading pads were dispensed.  I do think he will  benefit from custom orthotics given his recurrent ulceration to his left foot as he has prominent metatarsal heads.  Hope this will help offload help with further ulceration, infection.  Will check a predetermination. -Doxycycline -Patient encouraged to call the office with any questions, concerns, change in symptoms.   Vivi Barrack DPM

## 2022-11-19 NOTE — Procedures (Signed)
EMG & NCV Findings: Evaluation of the left median motor nerve showed prolonged distal onset latency (8.1 ms), reduced amplitude (1.2 mV), and decreased conduction velocity (Elbow-Wrist, 46 m/s).  The left median (across palm) sensory nerve showed prolonged distal peak latency (Wrist, 14.5 ms) and prolonged distal peak latency (Palm, 19.6 ms).  The left ulnar sensory nerve showed prolonged distal peak latency (4.4 ms) and decreased conduction velocity (Wrist-5th Digit, 32 m/s).  All remaining nerves (as indicated in the following tables) were within normal limits.    Needle evaluation of the left abductor pollicis brevis muscle showed increased insertional activity and increased spontaneous activity.  All remaining muscles (as indicated in the following table) showed no evidence of electrical instability.    Impression: The above electrodiagnostic study is ABNORMAL and reveals evidence of a severe left median nerve entrapment at the wrist (carpal tunnel syndrome) affecting sensory and motor components. The lesion is characterized by sensory and motor demyelination with evidence of axonal injury.   The above electrodiagnostic study also reveals evidence of suggestive of peripheral neuropathy of the left upper extremity.   There is no significant electrodiagnostic evidence of any other focal nerve entrapment, brachial plexopathy or cervical radiculopathy.   Recommendations: 1.  Follow-up with referring physician. 2.  Continue current management of symptoms.  3.  Suggest surgical evaluation for the median neuropathy.  ___________________________ Naaman PlummerFred Chantell Kunkler FAAPMR Board Certified, American Board of Physical Medicine and Rehabilitation    Nerve Conduction Studies Anti Sensory Summary Table   Stim Site NR Peak (ms) Norm Peak (ms) P-T Amp (V) Norm P-T Amp Site1 Site2 Delta-P (ms) Dist (cm) Vel (m/s) Norm Vel (m/s)  Left Median Acr Palm Anti Sensory (2nd Digit)  31.5C  Wrist    *14.5 <3.6 11.6  >10 Wrist Palm 5.1 0.0    Palm    *19.6 <2.0 1.2         Left Radial Anti Sensory (Base 1st Digit)  31.3C  Wrist    2.5 <3.1 12.8  Wrist Base 1st Digit 2.5 0.0    Left Ulnar Anti Sensory (5th Digit)  31.7C  Wrist    *4.4 <3.7 15.0 >15.0 Wrist 5th Digit 4.4 14.0 *32 >38   Motor Summary Table   Stim Site NR Onset (ms) Norm Onset (ms) O-P Amp (mV) Norm O-P Amp Site1 Site2 Delta-0 (ms) Dist (cm) Vel (m/s) Norm Vel (m/s)  Left Median Motor (Abd Poll Brev)  31.5C  Wrist    *8.1 <4.2 *1.2 >5 Elbow Wrist 4.6 21.0 *46 >50  Elbow    12.7  1.0         Left Ulnar Motor (Abd Dig Min)  31.5C  Wrist    3.9 <4.2 6.8 >3 B Elbow Wrist 3.8 20.0 53 >53  B Elbow    7.7  7.4  A Elbow B Elbow 1.5 11.0 73 >53  A Elbow    9.2  7.3          EMG   Side Muscle Nerve Root Ins Act Fibs Psw Amp Dur Poly Recrt Int Dennie BiblePat Comment  Left Abd Poll Brev Median C8-T1 *CRD *3+ *3+ Nml Nml 0 Nml Nml   Left 1stDorInt Ulnar C8-T1 Nml Nml Nml Nml Nml 0 Nml Nml   Left PronatorTeres Median C6-7 Nml Nml Nml Nml Nml 0 Nml Nml   Left Biceps Musculocut C5-6 Nml Nml Nml Nml Nml 0 Nml Nml     Nerve Conduction Studies Anti Sensory Left/Right Comparison   Stim Site  L Lat (ms) R Lat (ms) L-R Lat (ms) L Amp (V) R Amp (V) L-R Amp (%) Site1 Site2 L Vel (m/s) R Vel (m/s) L-R Vel (m/s)  Median Acr Palm Anti Sensory (2nd Digit)  31.5C  Wrist *14.5   11.6   Wrist Palm     Palm *19.6   1.2         Radial Anti Sensory (Base 1st Digit)  31.3C  Wrist 2.5   12.8   Wrist Base 1st Digit     Ulnar Anti Sensory (5th Digit)  31.7C  Wrist *4.4   15.0   Wrist 5th Digit *32     Motor Left/Right Comparison   Stim Site L Lat (ms) R Lat (ms) L-R Lat (ms) L Amp (mV) R Amp (mV) L-R Amp (%) Site1 Site2 L Vel (m/s) R Vel (m/s) L-R Vel (m/s)  Median Motor (Abd Poll Brev)  31.5C  Wrist *8.1   *1.2   Elbow Wrist *46    Elbow 12.7   1.0         Ulnar Motor (Abd Dig Min)  31.5C  Wrist 3.9   6.8   B Elbow Wrist 53    B Elbow 7.7   7.4   A Elbow B  Elbow 73    A Elbow 9.2   7.3            Waveforms:

## 2022-11-26 ENCOUNTER — Encounter: Payer: Self-pay | Admitting: Physical Medicine and Rehabilitation

## 2022-11-26 NOTE — Progress Notes (Signed)
[name] - 89 y.o. male MRN [id number]  Date of birth: 1932/03/31  Office Visit Note: Visit Date: 11/16/2022 PCP: Myrlene Broker, MD Referred by: Julieanne Cotton, PA-C  Subjective: Chief Complaint  Patient presents with   Left Wrist - Numbness, Pain   HPI:  [name] is a 89 y.o. male who comes in today for evaluation and management at the request of Karenann Cai, PA-C for chronic worsening left hand pain with paresthesia and weakness.  He reports this has been ongoing for up to 10 years.  He is a very complicated chronic pain patient.  He is followed at [facility] with Dr. Val Eagle.  He has had multiple neck surgeries and lumbar surgeries.  He had a pseudoarthrosis of the cervical spine.  He reports now 6 out of 10 pain and 4 out of 10 loss of function of his left hand.  Reports mainly left wrist pain but also numbness really somewhat nondermatomal may be more radial digits.  He reports difficulty buttoning close and using his hand.  He has had a prior right carpal tunnel release.  He had electrodiagnostic studies performed by Dr. Lesia Sago at [facility] neurology in 2018 and those are reviewed and listed in the note below.  The study was somewhat confusing with multiple small findings but clear moderate right median neuropathy and mild left median neuropathy.  Some level of polyneuropathy.  He also has polyarticular arthritic problems.  No history of diabetes.   I spent more than 30 minutes speaking face-to-face with the patient with 50% of the time in counseling and discussing coordination of care.   Review of Systems  Musculoskeletal:  Positive for back pain, joint pain and neck pain.  Neurological:  Positive for tingling and weakness.  All other systems reviewed and are negative.  Otherwise per HPI.  Assessment & Plan: Visit Diagnoses:    ICD-10-CM   1. Paresthesia of skin  R20.2 NCV with EMG (electromyography)    2. Pain in left  hand  M79.642     3. Cervicalgia  M54.2     4. S/P cervical spinal fusion  Z98.1     5. Chronic pain syndrome  G89.4       Plan: Impression: The above electrodiagnostic study is ABNORMAL and reveals evidence of a severe left median nerve entrapment at the wrist (carpal tunnel syndrome) affecting sensory and motor components. The lesion is characterized by sensory and motor demyelination with evidence of axonal injury.   The above electrodiagnostic study also reveals evidence of suggestive of peripheral neuropathy of the left upper extremity.   There is no significant electrodiagnostic evidence of any other focal nerve entrapment, brachial plexopathy or cervical radiculopathy.   Recommendations: 1.  Follow-up with referring physician. 2.  Continue current management of symptoms.  3.  Suggest surgical evaluation for the median neuropathy.  Meds & Orders: No orders of the defined types were placed in this encounter.   Orders Placed This Encounter  Procedures   NCV with EMG (electromyography)    Follow-up: Return for [employer], PA-C.   Procedures: No procedures performed  EMG & NCV Findings: Evaluation of the left median motor nerve showed prolonged distal onset latency (8.1 ms), reduced amplitude (1.2 mV), and decreased conduction velocity (Elbow-Wrist, 46 m/s).  The left median (across palm) sensory nerve showed prolonged distal peak latency (Wrist, 14.5 ms) and prolonged distal peak latency (Palm, 19.6 ms).  The left ulnar sensory  nerve showed prolonged distal peak latency (4.4 ms) and decreased conduction velocity (Wrist-5th Digit, 32 m/s).  All remaining nerves (as indicated in the following tables) were within normal limits.    Needle evaluation of the left abductor pollicis brevis muscle showed increased insertional activity and increased spontaneous activity.  All remaining muscles (as indicated in the following table) showed no evidence of electrical instability.     Impression: The above electrodiagnostic study is ABNORMAL and reveals evidence of a severe left median nerve entrapment at the wrist (carpal tunnel syndrome) affecting sensory and motor components. The lesion is characterized by sensory and motor demyelination with evidence of axonal injury.   The above electrodiagnostic study also reveals evidence of suggestive of peripheral neuropathy of the left upper extremity.   There is no significant electrodiagnostic evidence of any other focal nerve entrapment, brachial plexopathy or cervical radiculopathy.   Recommendations: 1.  Follow-up with referring physician. 2.  Continue current management of symptoms.  3.  Suggest surgical evaluation for the median neuropathy.  ___________________________ [name] FAAPMR Board Certified, American Board of Physical Medicine and Rehabilitation    Nerve Conduction Studies Anti Sensory Summary Table   Stim Site NR Peak (ms) Norm Peak (ms) P-T Amp (V) Norm P-T Amp Site1 Site2 Delta-P (ms) Dist (cm) Vel (m/s) Norm Vel (m/s)  Left Median Acr Palm Anti Sensory (2nd Digit)  31.5C  Wrist    *14.5 <3.6 11.6 >10 Wrist Palm 5.1 0.0    Palm    *19.6 <2.0 1.2         Left Radial Anti Sensory (Base 1st Digit)  31.3C  Wrist    2.5 <3.1 12.8  Wrist Base 1st Digit 2.5 0.0    Left Ulnar Anti Sensory (5th Digit)  31.7C  Wrist    *4.4 <3.7 15.0 >15.0 Wrist 5th Digit 4.4 14.0 *32 >38   Motor Summary Table   Stim Site NR Onset (ms) Norm Onset (ms) O-P Amp (mV) Norm O-P Amp Site1 Site2 Delta-0 (ms) Dist (cm) Vel (m/s) Norm Vel (m/s)  Left Median Motor (Abd Poll Brev)  31.5C  Wrist    *8.1 <4.2 *1.2 >5 Elbow Wrist 4.6 21.0 *46 >50  Elbow    12.7  1.0         Left Ulnar Motor (Abd Dig Min)  31.5C  Wrist    3.9 <4.2 6.8 >3 B Elbow Wrist 3.8 20.0 53 >53  B Elbow    7.7  7.4  A Elbow B Elbow 1.5 11.0 73 >53  A Elbow    9.2  7.3          EMG   Side Muscle Nerve Root Ins Act Fibs Psw Amp Dur Poly Recrt Int  [name] Comment  Left Abd Poll Brev Median C8-T1 *CRD *3+ *3+ Nml Nml 0 Nml Nml   Left 1stDorInt Ulnar C8-T1 Nml Nml Nml Nml Nml 0 Nml Nml   Left PronatorTeres Median C6-7 Nml Nml Nml Nml Nml 0 Nml Nml   Left Biceps Musculocut C5-6 Nml Nml Nml Nml Nml 0 Nml Nml     Nerve Conduction Studies Anti Sensory Left/Right Comparison   Stim Site L Lat (ms) R Lat (ms) L-R Lat (ms) L Amp (V) R Amp (V) L-R Amp (%) Site1 Site2 L Vel (m/s) R Vel (m/s) L-R Vel (m/s)  Median Acr Palm Anti Sensory (2nd Digit)  31.5C  Wrist *14.5   11.6   Wrist Palm     Palm *19.6  1.2         Radial Anti Sensory (Base 1st Digit)  31.3C  Wrist 2.5   12.8   Wrist Base 1st Digit     Ulnar Anti Sensory (5th Digit)  31.7C  Wrist *4.4   15.0   Wrist 5th Digit *32     Motor Left/Right Comparison   Stim Site L Lat (ms) R Lat (ms) L-R Lat (ms) L Amp (mV) R Amp (mV) L-R Amp (%) Site1 Site2 L Vel (m/s) R Vel (m/s) L-R Vel (m/s)  Median Motor (Abd Poll Brev)  31.5C  Wrist *8.1   *1.2   Elbow Wrist *46    Elbow 12.7   1.0         Ulnar Motor (Abd Dig Min)  31.5C  Wrist 3.9   6.8   B Elbow Wrist 53    B Elbow 7.7   7.4   A Elbow B Elbow 73    A Elbow 9.2   7.3            Waveforms:             Clinical History: EMG/NCS IMPRESSION:   Nerve conduction studies done on both upper extremities and on the left lower extremity shows evidence of bilateral carpal tunnel syndrome of moderate severity on the right, mild severity on the left. NCV evaluation of the left lower extremity shows no motor response for the left peroneal nerve with sensory sparing. EMG evaluation of the left lower extremity shows evidence of a mild chronic and acute S1 radiculopathy. There is no evidence of an overlying L5 radiculopathy. Motor findings by nerve conduction study on the left peroneal nerve likely represent a distal dysfunction of this nerve. EMG evaluation of the right upper extremity shows findings consistent with carpal tunnel syndrome and  evidence of a mild overlying chronic C8 radiculopathy. No acute findings were noted in the right upper extremity.   Jordan Palau MD 09/04/2016 4:50 PM   [facility]     Objective:  VS:  HT:    WT:   BMI:     BP:   HR: bpm  TEMP: ( )  RESP:  Physical Exam Vitals and nursing note reviewed.  Constitutional:      General: He is not in acute distress.    Appearance: Normal appearance. He is well-developed.  HENT:     Head: Normocephalic and atraumatic.  Eyes:     Conjunctiva/sclera: Conjunctivae normal.     Pupils: Pupils are equal, round, and reactive to light.  Cardiovascular:     Rate and Rhythm: Normal rate.     Pulses: Normal pulses.     Heart sounds: Normal heart sounds.  Pulmonary:     Effort: Pulmonary effort is normal. No respiratory distress.  Musculoskeletal:        General: Tenderness present.     Cervical back: Normal range of motion and neck supple. Tenderness present. No rigidity.     Right lower leg: No edema.     Left lower leg: No edema.     Comments: Inspection reveals well-healed surgical scar from carpal tunnel release on the right and mild flattening of the left APB but no atrophy of the right APBor bilateral  FDI or hand intrinsics. There is no swelling, color changes, allodynia or dystrophic changes. There is 5 out of 5 strength in the bilateral wrist extension, finger abduction and long finger flexion.  There is decreased sensation to light touch  in the left median nerve distribution.. There is a negative Froment's test bilaterally. There is a negative Tinel's test at the bilateral wrist and elbow. There is a negative Phalen's test bilaterally. There is a negative Hoffmann's test bilaterally.  Skin:    General: Skin is warm and dry.     Findings: No erythema or rash.  Neurological:     General: No focal deficit present.     Mental Status: He is alert and oriented to person, place, and time.     Sensory: Sensory deficit present.      Motor: No weakness or abnormal muscle tone.     Coordination: Coordination normal.     Gait: Gait normal.  Psychiatric:        Mood and Affect: Mood normal.        Behavior: Behavior normal.        Thought Content: Thought content normal.      Imaging: No results found.

## 2022-11-29 ENCOUNTER — Ambulatory Visit: Payer: Medicare Other | Admitting: Podiatry

## 2022-11-29 ENCOUNTER — Telehealth: Payer: Self-pay | Admitting: *Deleted

## 2022-11-29 DIAGNOSIS — R600 Localized edema: Secondary | ICD-10-CM | POA: Diagnosis not present

## 2022-11-29 DIAGNOSIS — I872 Venous insufficiency (chronic) (peripheral): Secondary | ICD-10-CM

## 2022-11-29 DIAGNOSIS — L97321 Non-pressure chronic ulcer of left ankle limited to breakdown of skin: Secondary | ICD-10-CM | POA: Diagnosis not present

## 2022-11-29 DIAGNOSIS — S81802A Unspecified open wound, left lower leg, initial encounter: Secondary | ICD-10-CM

## 2022-11-29 MED ORDER — CEPHALEXIN 500 MG PO CAPS: 500.0000 mg | ORAL_CAPSULE | Freq: Three times a day (TID) | ORAL | 0 refills | Status: AC

## 2022-11-29 NOTE — Progress Notes (Signed)
Subjective:  Patient ID: [name], male    DOB: 26-Jun-1932,  MRN: [id number]  Chief Complaint  Patient presents with   Foot Ulcer    Patient came in today for left leg wound, medial side, leg started swelling and drainage,     89 y.o. male presents with concern for new ulceration on the left leg.  He says that the leg started swelling recently and he began to have an opening on the inside of the left lower leg with some drainage.  Says the wound has been weeping.  He thinks he has been doing a better job keeping the swelling under control is trying to use some compression wraps but wants to know what else to put on the wound.  He is also hoping to get an antibiotic at this time as he is concerned about infection.  Past Medical History:  Diagnosis Date   ACTINIC KERATOSIS, HEAD 03/23/2008   ALLERGIC RHINITIS 03/27/2007   Arthritis    BENIGN PROSTATIC HYPERTROPHY, WITH URINARY OBSTRUCTION 10/16/2007   CAD 08/18/2007   "patient denies any issues with heart, does not see cardiologist"   Carpal tunnel syndrome, bilateral 09/04/2016   CARPAL TUNNEL SYNDROME, LEFT 04/13/2008   CHF (congestive heart failure)    Chronic back pain    Chronic neck pain    Complication of anesthesia    left lower jaw teeth have been injured in the past and he has lost those, now has 2 loose front teeth due to a mouth injury on 01/02/20   DVT (deep venous thrombosis)    ERECTILE DYSFUNCTION, ORGANIC 02/20/2010   Gout, unspecified 02/20/2010   Headache(784.0)    migraines hx of- started at age 57- none since 11   History of melanoma    HYPERLIPIDEMIA, WITH HIGH HDL 10/18/2008   HYPERTENSION 05/09/2007   sees Dr. 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   Patellar tendinitis 10/10/2009   Sciatic nerve pain    on Oxycodone as needed   SKIN CANCER, HX OF 03/27/2007   UNS ADVRS EFF UNS  RX MEDICINAL&BIOLOGICAL SBSTNC 08/18/2007   Urinary urgency     Allergies  Allergen Reactions   Amlodipine Swelling and Other (See Comments)    Ankle swelling   Lyrica [Pregabalin] Swelling and Other (See Comments)    Hands and feet swell   Gabapentin Other (See Comments)    Lower extremity edema- knees and feet    ROS: Negative except as per HPI above  Objective:  General: AAO x3, NAD  Dermatological: Attention to the medial aspect of the left lower leg there is noted to be venous stasis ulceration approximately 5 cm in diameter with very superficial breakdown of the skin and some serous drainage consistent with venous stasis ulceration.  There is mild circumferential edema of the left lower extremity however does not appear severe at this time.  See picture below.  No erythema no malodor no clinical sign of infection  Vascular:  Dorsalis Pedis artery and Posterior Tibial artery pedal pulses are 2/4 bilateral.  Capillary fill time < 3 sec to all digits.   Neruologic: Grossly intact via light touch bilateral. Protective threshold intact to all sites bilateral.   Musculoskeletal: No gross boney pedal deformities bilateral. No pain, crepitus, or limitation noted with foot and ankle range of motion bilateral. Muscular strength 5/5  in all groups tested bilateral.  Gait: Unassisted, Nonantalgic.     Radiographs:  Deferred at this visit Assessment:   1. Venous stasis ulcer of left ankle limited to breakdown of skin without varicose veins   2. Pedal edema   3. Wound of left lower extremity, initial encounter      Plan:  Patient was evaluated and treated and all questions answered.  # Venous stasis ulceration of the left medial ankle -We discussed the etiology and factors that are a part of the wound healing process.   -Discussed that strict edema control is the primary measure to promote healing of venous stasis ulcerations -Debridement was not indicated at this visit due to the  superficial nature of the ulceration -Dressed with Betadine and Telfa Kerlix and compressive Ace wrap, DSD. -Continue home dressing changes daily with dry and compressive Ace wrap dressing wrapped from toes up to the knee -Last antibiotics: E Rx for cephalexin 500 mg 3 times daily for 7 days due to patient concern for infection however I do not clinically appreciate any evidence of infection at this visit -Imaging: Deferred due to superficial nature and venous stasis ulceration -Referral to wound care clinic placed   Return in about 2 weeks (around 12/13/2022) for F/u L venous stasis ulcer w/ Dr. Ardelle Anton.       Return in about 2 weeks (around 12/13/2022) for F/u L venous stasis ulcer w/ Dr. Ardelle Anton.          Corinna Gab, DPM [facility]

## 2022-11-29 NOTE — Telephone Encounter (Signed)
Patient has a wound on his upper ankle that has developed which is sore, skin has broken, swelling since 2 days ago, no odor noticed, has finished his round of antibiotics(doxy was also on stomach as well) 2-3 days ago. He is scheduled to be seen this afternoon.

## 2022-12-06 ENCOUNTER — Telehealth: Payer: Self-pay | Admitting: Internal Medicine

## 2022-12-06 NOTE — Telephone Encounter (Signed)
Contacted [name] to schedule their annual wellness visit. Appointment made for 12/13/2022.  [facility] Care Guide [facility] AWV TEAM Direct Dial: [phone]

## 2022-12-07 ENCOUNTER — Ambulatory Visit: Payer: Medicare Other | Admitting: Surgical

## 2022-12-07 DIAGNOSIS — R202 Paresthesia of skin: Secondary | ICD-10-CM

## 2022-12-07 DIAGNOSIS — G5602 Carpal tunnel syndrome, left upper limb: Secondary | ICD-10-CM | POA: Diagnosis not present

## 2022-12-08 ENCOUNTER — Encounter: Payer: Self-pay | Admitting: Surgical

## 2022-12-08 NOTE — Progress Notes (Signed)
Follow-up Office Visit Note   Patient: [name]           Date of Birth: 03-Apr-1932           MRN: [id number] Visit Date: 12/07/2022 Requested by: Myrlene Broker, MD [address] [place],  [place] [address] PCP: Myrlene Broker, MD  Subjective: Chief Complaint  Patient presents with   Left Hand - Follow-up    HPI: [name] is a 89 y.o. male who returns to the office for follow-up visit.    Plan at last visit was: 89 year old male who presents for evaluation of left hand and wrist pain. He has history of left wrist osteoarthritis that was demonstrated on radiographs about 6 months ago. Severe arthritis of the radiocarpal, STT, first CMC joints. He has soft tissue swelling proximal to the wrist which has been aspirated in the past but without any long-term success. Last aspiration attempt was by Dr. Jean Rosenthal in August 2023. Most of his symptoms seem related to carpal tunnel syndrome of the left hand. He wants this definitively addressed. Plan to further evaluate with nerve conduction study of the left upper extremity to evaluate for left carpal tunnel syndrome. Follow-up with Dr. August Saucer after this to discuss surgical intervention.   Since then, patient notes continued left hand pain with numbness and tingling throughout the distribution of the median nerve.  States this has been going on for about 10 years.  He is here to review nerve conduction study that demonstrated severe median nerve entrapment in the carpal tunnel.  He would like to proceed with surgical intervention.              ROS: All systems reviewed are negative as they relate to the chief complaint within the history of present illness.  Patient denies fevers or chills.  Assessment & Plan: Visit Diagnoses:  1. Left carpal tunnel syndrome   2. Paresthesia of skin     Plan: [name] is a 89 y.o. male who returns to the office for follow-up visit for left hand pain with numbness/tingling.  Plan  from last visit was noted above in HPI.  They now return with nerve conduction study demonstrating carpal tunnel syndrome of the left upper extremity.  He would like to have this taken care of surgically.  He has had symptoms for about 10 years and they have progressed to the point where he has fairly constant numbness and has difficulty performing activities of daily living such as buttoning his shirt due to the numbness and the pain in his hand.  We discussed the surgical process as well as the risks and benefits of the procedure including but not limited to the risk of infection, nerve/blood vessel damage, incomplete resolution of symptoms, medical complication from surgery.  He understands that with his long duration of symptoms and the severity of his carpal tunnel syndrome, he may not have full relief of his pain and numbness.  He would also like the cyst he has on the volar aspect of the wrist decompressed at the same time as the carpal tunnel surgery.  This cyst was evaluated under ultrasound and is composed of primarily anechoic fluid surrounding the flexor tendons and median nerve.  It is deep to the ulnar artery and vein and close to the radial artery but does not seem to wrap around the radial artery at any point.  Do not see any stalk originating from the radiocarpal joint though this is  most likely where it is coming from.  Similar risks and benefits of cyst removal were discussed as with the carpal tunnel release.  He understands risk of recurrence.  Follow-up after procedure.         Follow-Up Instructions: No follow-ups on file.   Orders:  No orders of the defined types were placed in this encounter.  No orders of the defined types were placed in this encounter.     Procedures: No procedures performed   Clinical Data: No additional findings.  Objective: Vital Signs: There were no vitals taken for this visit.  Physical Exam:  Constitutional: Patient appears  well-developed HEENT:  Head: Normocephalic Eyes:EOM are normal Neck: Normal range of motion Cardiovascular: Normal rate Pulmonary/chest: Effort normal Neurologic: Patient is alert Skin: Skin is warm Psychiatric: Patient has normal mood and affect  Ortho Exam: Ortho exam demonstrates left hand with intact EPL, FPL, finger abduction.  Large area of swelling noted on the volar aspect of the distal forearm just proximal to the wrist.  This is nontender and there is no associated cellulitis or erythema.  States that this has been here for about 10 years.  Positive Tinel sign.  Positive Durkan sign.  2+ radial pulse of the left upper extremity.  Specialty Comments:  EMG/NCS IMPRESSION:   Nerve conduction studies done on both upper extremities and on the left lower extremity shows evidence of bilateral carpal tunnel syndrome of moderate severity on the right, mild severity on the left. NCV evaluation of the left lower extremity shows no motor response for the left peroneal nerve with sensory sparing. EMG evaluation of the left lower extremity shows evidence of a mild chronic and acute S1 radiculopathy. There is no evidence of an overlying L5 radiculopathy. Motor findings by nerve conduction study on the left peroneal nerve likely represent a distal dysfunction of this nerve. EMG evaluation of the right upper extremity shows findings consistent with carpal tunnel syndrome and evidence of a mild overlying chronic C8 radiculopathy. No acute findings were noted in the right upper extremity.   Marlan Palau MD 09/04/2016 4:50 PM   [facility]  Imaging: No results found.   PMFS History: Patient Active Problem List   Diagnosis Date Noted   HCAP (healthcare-associated pneumonia) 08/16/2022   Chest pain 07/27/2022   CHF (congestive heart failure) (HCC) 07/24/2022   Hx pulmonary embolism 06/20/2022   Hypokalemia 06/20/2022   Abnormal EKG 06/19/2022   Fracture of twelfth  thoracic vertebra (HCC) 01/03/2022   Opioid dependence (HCC) 01/03/2022   Poor balance 01/03/2022   Pedal edema 01/03/2022   Bilateral wrist pain 06/19/2021   Fall 01/11/2021   Orthostatic hypotension    Weakness    Osteoporosis 10/07/2020   Lumbar compression fracture (HCC) 09/07/2020   Greater trochanteric bursitis of both hips 05/04/2020   Left patella fracture 01/19/2020   Arthritis of left knee 05/07/2019   Pre-operative clearance 04/28/2019   Acute on chronic systolic CHF (congestive heart failure) (HCC) 08/10/2018   Leg wound, left 03/19/2018   Right arm pain 11/11/2017   Primary osteoarthritis of right foot 08/21/2017   Syncope 02/27/2017   Arthritis of right ankle 12/18/2016   Carpal tunnel syndrome, bilateral 09/04/2016   Leg swelling 08/16/2016   Hand weakness 07/12/2016   Degenerative arthritis of left knee 06/28/2016   Body mass index (bmi) 28.0-28.9, adult 06/18/2016   S/P cervical spinal fusion 06/14/2016   Iron deficiency anemia 03/16/2016   Arthritis of left acromioclavicular joint 01/03/2016  Rotator cuff disorder 01/03/2016   Osteoarthritis of left knee 04/30/2015   Baker's cyst of knee 04/30/2015   Esophageal stricture 04/26/2015   Benign prostatic hyperplasia 02/21/2015   Constipation 02/21/2015   Myalgia and myositis 11/18/2014   Status post lumbar spine operation 06/25/2014   Pseudoarthrosis of cervical spine (HCC) 05/25/2014   Sacral fracture, closed (HCC) 09/23/2013   Spondylolisthesis, congenital 09/02/2013   Polyarticular arthritis 07/19/2013   Spondylosis of lumbosacral spine at multiple levels without myelopathy 04/24/2013   Chronic radicular low back pain 12/15/2012   Gout 02/20/2010   ERECTILE DYSFUNCTION, ORGANIC 02/20/2010   Hyperlipidemia 10/18/2008   CAD (coronary artery disease) 08/18/2007   Essential hypertension 05/09/2007   ALLERGIC RHINITIS 03/27/2007   Past Medical History:  Diagnosis Date   ACTINIC KERATOSIS, HEAD 03/23/2008    ALLERGIC RHINITIS 03/27/2007   Arthritis    BENIGN PROSTATIC HYPERTROPHY, WITH URINARY OBSTRUCTION 10/16/2007   CAD 08/18/2007   "patient denies any issues with heart, does not see cardiologist"   Carpal tunnel syndrome, bilateral 09/04/2016   CARPAL TUNNEL SYNDROME, LEFT 04/13/2008   CHF (congestive heart failure) (HCC)    Chronic back pain    Chronic neck pain    Complication of anesthesia    left lower jaw teeth have been injured in the past and he has lost those, now has 2 loose front teeth due to a mouth injury on 01/02/20   DVT (deep venous thrombosis) (HCC)    ERECTILE DYSFUNCTION, ORGANIC 02/20/2010   Gout, unspecified 02/20/2010   Headache(784.0)    migraines hx of- started at age 79- none since 89   History of melanoma    HYPERLIPIDEMIA, WITH HIGH HDL 10/18/2008   HYPERTENSION 05/09/2007   sees Dr. 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   Patellar tendinitis 10/10/2009   Sciatic nerve pain    on Oxycodone as needed   SKIN CANCER, HX OF 03/27/2007   UNS ADVRS EFF UNS RX MEDICINAL&BIOLOGICAL SBSTNC 08/18/2007   Urinary urgency     Family History  Problem Relation Age of Onset   Dementia Mother    Anemia Father    Heart disease Father    Stroke Sister        ICH   Lung disease Sister     Past Surgical History:  Procedure Laterality Date   APPENDECTOMY  1969   APPLICATION OF WOUND VAC Left 01/19/2020   Procedure: Application Of Wound Vac;  Surgeon: Cammy Copa, MD;  Location: [facility] OR;  Service: Orthopedics;  Laterality: Left;   BLEPHAROPLASTY     CARPAL TUNNEL RELEASE Right    CHOLECYSTECTOMY     ESOPHAGOGASTRODUODENOSCOPY (EGD) WITH PROPOFOL N/A 04/26/2015   Procedure: ESOPHAGOGASTRODUODENOSCOPY (EGD) WITH PROPOFOL;  Surgeon: Louis Meckel, MD;  Location: [facility] ENDOSCOPY;  Service: Endoscopy;  Laterality: N/A;   EYE SURGERY      bilateral cataracts removed and vitrectomies   JOINT REPLACEMENT Right    knee   KNEE ARTHROSCOPY     KYPHOPLASTY N/A 06/10/2020   Procedure: Thoracic Twelve, Lumbar One, Lumbar Two Kyphoplasty;  Surgeon: Coletta Memos, MD;  Location: [facility] OR;  Service: Neurosurgery;  Laterality: N/A;  3C/RM 21   KYPHOPLASTY N/A 09/07/2020   Procedure: Lumbar three KYPHOPLASTY;  Surgeon: Coletta Memos, MD;  Location: [facility] OR;  Service: Neurosurgery;  Laterality: N/A;   LAMINECTOMY  LATERAL FUSION LUMBAR SPINE, TRANSVERSE     LEFT HEART CATH AND CORONARY ANGIOGRAPHY N/A 07/27/2022   Procedure: LEFT HEART CATH AND CORONARY ANGIOGRAPHY;  Surgeon: Orpah Cobb, MD;  Location: [facility] INVASIVE CV LAB;  Service: Cardiovascular;  Laterality: N/A;   LUMBAR LAMINECTOMY/DECOMPRESSION MICRODISCECTOMY Left 11/03/2012   Procedure: LUMBAR LAMINECTOMY/DECOMPRESSION MICRODISCECTOMY 1 LEVEL;  Surgeon: Hewitt Shorts, MD;  Location: [facility] NEURO ORS;  Service: Neurosurgery;  Laterality: Left;  LEFT L5S1 laminotomy foraminotomy and possible microdiskectomy   MELANOMA EXCISION     scalp   NASAL SINUS SURGERY     ORIF PATELLA Left 01/19/2020   Procedure: LEFT OPEN REDUCTION INTERNAL (ORIF) FIXATION PATELLA WITH AUTO GRAFTING USING HAMSTRING REINFORCEMENT;  Surgeon: Cammy Copa, MD;  Location: [facility] OR;  Service: Orthopedics;  Laterality: Left;   POSTERIOR CERVICAL FUSION/FORAMINOTOMY Right 10/01/2012   Procedure: POSTERIOR CERVICAL FUSION/FORAMINOTOMY LEVEL 1;  Surgeon: Hewitt Shorts, MD;  Location: [facility] NEURO ORS;  Service: Neurosurgery;  Laterality: Right;  Right Cervical seven thoracic one Cervical laminectomy/foraminotomy with posterior cervical arthrodesis    POSTERIOR LUMBAR FUSION N/A 05/23/2013   Procedure: exploration of lumbar wound, explantation of left interbody implant.;  Surgeon: Hewitt Shorts, MD;  Location: [facility] OR;  Service: Neurosurgery;  Laterality: N/A;   RECTAL SURGERY     Fissure   TOTAL KNEE ARTHROPLASTY  Left 05/07/2019   Procedure: LEFT TOTAL KNEE ARTHROPLASTY;  Surgeon: Cammy Copa, MD;  Location: [facility] OR;  Service: Orthopedics;  Laterality: Left;   VITRECTOMY     right and left 2013   Social History   Occupational History   Occupation: Retired  Tobacco Use   Smoking status: Former    Packs/day: 1.00    Years: 33.00    Additional pack years: 0.00    Total pack years: 33.00    Types: Cigarettes    Quit date: 09/29/1981    Years since quitting: 41.2   Smokeless tobacco: Never  Vaping Use   Vaping Use: Never used  Substance and Sexual Activity   Alcohol use: Not Currently    Comment: last drink 07/19/22. finishes 1/2L per week dark rum.   Drug use: No   Sexual activity: Not Currently    Partners: Female    Comment: Married

## 2022-12-11 ENCOUNTER — Telehealth: Payer: Self-pay | Admitting: Internal Medicine

## 2022-12-11 NOTE — Telephone Encounter (Signed)
Surgical clearance form received from [facility].Please fax to [phone] when completed.  Placed in provider box up front.

## 2022-12-12 ENCOUNTER — Other Ambulatory Visit: Payer: Self-pay

## 2022-12-12 DIAGNOSIS — M79642 Pain in left hand: Secondary | ICD-10-CM

## 2022-12-13 ENCOUNTER — Telehealth: Payer: Self-pay

## 2022-12-13 ENCOUNTER — Ambulatory Visit: Payer: Medicare Other | Admitting: Podiatry

## 2022-12-13 DIAGNOSIS — M216X2 Other acquired deformities of left foot: Secondary | ICD-10-CM

## 2022-12-13 DIAGNOSIS — L97522 Non-pressure chronic ulcer of other part of left foot with fat layer exposed: Secondary | ICD-10-CM

## 2022-12-13 DIAGNOSIS — L97321 Non-pressure chronic ulcer of left ankle limited to breakdown of skin: Secondary | ICD-10-CM

## 2022-12-13 DIAGNOSIS — I872 Venous insufficiency (chronic) (peripheral): Secondary | ICD-10-CM

## 2022-12-13 NOTE — Telephone Encounter (Signed)
Placed inside providers office box to review.

## 2022-12-13 NOTE — Telephone Encounter (Addendum)
Called patient to complete AWV for today.  Patient declined AWV.  Patient stated that he changed providers and will no longer be a patient at this facility.

## 2022-12-14 NOTE — Telephone Encounter (Signed)
I have recently gotten note about this patient that he is no longer a patient at this clinic. Can we verify if this is correct pt chart for that note?

## 2022-12-16 ENCOUNTER — Other Ambulatory Visit: Payer: Self-pay | Admitting: Internal Medicine

## 2022-12-16 NOTE — Progress Notes (Signed)
  Subjective:  Patient ID: [name], male    DOB: [birth year],  MRN: [id number]  Chief Complaint  Patient presents with   Wound Check    F/u left venous stasis ulcer     89 y.o. male presents for the evaluation of venous stasis ulceration of the left leg as well as submetatarsal 5 pain.  He has been followed in the wound care center he states the wound is closed.  He does try to keep a wrap on it but it makes it hurt more.  He does not report any fevers or chills.   Objective:  General: AAO x3, NAD  Dermatological: On the medial aspect ankle there is no significant skin breakdown noted today there is evidence of venous insufficiency.  There is no drainage or pus.  No fluctuance or crepitation is noted.  The left foot submetatarsal 5 is a hyperkeratotic lesion and upon debridement there is no underlying ulceration drainage or any signs of infection noted today.   Vascular:  Dorsalis Pedis artery and Posterior Tibial artery pedal pulses are palpable bilateral.  Capillary fill time < 3 sec to all digits.   Neruologic: Grossly intact via light touch bilateral. Protective threshold intact to all sites bilateral.   Musculoskeletal: Prominence of metatarsal head plantarly with atrophy of the fat pad resulting of the callus, wound on the left foot to metatarsal 5.   Assessment:   Venous insufficiency, hyperkeratotic preulcerative calluses with prominent metatarsal head  Plan:  Patient was evaluated and treated and all questions answered.  # 1Venous stasis ulceration of the left medial ankle -We discussed the etiology and factors that are a part of the wound healing process.   -We can discuss edema control.  Discussed compression elevation.  At this time we did feel he was a little open wound care center.  This is appropriate but if the wound would open back up I would recommend following up with them.  #2  Preulcerative hyperkeratotic lesion left foot -Sharply debrided lesions with  any complications or bleeding.  Discussed moisturizer, offloading.  Offloading pads been helpful he will continue with this.  Monitor for any clinical signs or symptoms of infection and directed to call the office immediately should any occur or go to the ER.  Return if symptoms worsen or fail to improve.  Vivi Barrack DPM

## 2022-12-18 NOTE — Telephone Encounter (Signed)
Spoke with patient and he is does not know or sure if he will be able to come in to his appointment  with provider on the 21 st pt is holding his appointment until he know for sure if he doesn't have all his information about switching just yet

## 2022-12-21 ENCOUNTER — Other Ambulatory Visit: Payer: Self-pay | Admitting: Podiatry

## 2022-12-21 ENCOUNTER — Telehealth: Payer: Self-pay | Admitting: Podiatry

## 2022-12-21 ENCOUNTER — Ambulatory Visit (INDEPENDENT_AMBULATORY_CARE_PROVIDER_SITE_OTHER): Payer: Medicare Other | Admitting: Podiatry

## 2022-12-21 DIAGNOSIS — I872 Venous insufficiency (chronic) (peripheral): Secondary | ICD-10-CM

## 2022-12-21 DIAGNOSIS — L97321 Non-pressure chronic ulcer of left ankle limited to breakdown of skin: Secondary | ICD-10-CM

## 2022-12-21 MED ORDER — AMOXICILLIN-POT CLAVULANATE 875-125 MG PO TABS: 1.0000 | ORAL_TABLET | Freq: Two times a day (BID) | ORAL | 0 refills | Status: DC

## 2022-12-21 NOTE — Progress Notes (Signed)
  Subjective:  Patient ID: [name], male    DOB: 15-Mar-1932,  MRN: [id number]  Chief Complaint  Patient presents with   Wound Check    Venous stasis ulcer of left ankle. Patient is in pain. Patient was unable to go to the wound care center for treatment due to having another appointment. Patient stated he is taking his Lasix as ordered.      89 y.o. male presents for the evaluation of venous stasis ulceration of the left leg where she has had worsening pain with.  He was previous seen by Dr. Loreta Ave approximately 8 days ago.  He was referred to wound care center.  He did have an appointment then canceled it due to conflict with another appointment which is he is kept it though.  He has a lot of pain as he is some increased drainage from the venous stasis ulceration on the left ankle.  He is not taking antibiotics currently.  He denies nausea vomiting fever chills  Objective:  General: AAO x3, NAD  Dermatological: On the medial aspect ankle there is no significant skin breakdown noted today there is evidence of venous insufficiency.  There is drainage noted as well as erythema surrounding the venous stasis ulceration.   Vascular:  Dorsalis Pedis artery and Posterior Tibial artery pedal pulses are palpable bilateral.  Capillary fill time < 3 sec to all digits.   Neruologic: Grossly intact via light touch bilateral. Protective threshold intact to all sites bilateral.   Musculoskeletal: Prominence of metatarsal head plantarly with atrophy of the fat pad resulting of the callus, wound on the left foot to metatarsal 5.   Assessment:   Venous insufficiency, hyperkeratotic preulcerative calluses with prominent metatarsal head,   venous stasis ulceration left medial ankle with cellulitis  Plan:  Patient was evaluated and treated and all questions answered.  # 1Venous stasis ulceration of the left medial ankle # Cellulitis of left medial ankle around venous stasis ulceration -We  discussed the etiology and factors that are a part of the wound healing process.   -We can discuss edema control.  Discussed compression elevation -Recommend patient proceed with wound care clinic for ongoing wound care venous stasis ulcerations.  He will need compression therapy likely to heal this ulceration -Recommend daily dressing change with Betadine and dry gauze dressing as well as gauze wrap and compression wrap overlying such as Ace wrap to promote healing of the wound. -Took a wound culture of the left medial ankle at this visit due to concern for infection -Will place patient on a course of Augmentin 875-125 mg twice daily for the next 10 days.  Discussed risk and benefits of medication as well as possible side effects. -Debridement deferred due to pain at this time.  #2  Preulcerative hyperkeratotic lesion left foot -Continue with offloading pads  Monitor for any clinical signs or symptoms of infection and directed to call the office immediately should any occur or go to the ER.  Discussed that if he develops fever chills he should go to the emergency department  Return in about 2 weeks (around 01/04/2023) for f/u L medial ankle venous stasis ulceration.  Jenelle Mages Phenix Vandermeulen DPM

## 2022-12-21 NOTE — Telephone Encounter (Signed)
Pt called asking if Dr Ardelle Anton could work him in today for a severe left ankle wound. He was up all night because of it.  Dr Ardelle Anton currently has a full schedule and is leaving early today and I asked pt if he would be willing to see another provider. He said if they are well trained in wounds. He stated the last time he saw someone and they knew about wounds.  Checked chart and it was Dr Annamary Rummage, I added pt to Dr Annamary Rummage today at [phone]

## 2022-12-23 ENCOUNTER — Ambulatory Visit (HOSPITAL_BASED_OUTPATIENT_CLINIC_OR_DEPARTMENT_OTHER)
Admission: RE | Admit: 2022-12-23 | Discharge: 2022-12-23 | Disposition: A | Discharge status: Home / Self Care | Payer: Medicare Other | Source: Ambulatory Visit | Attending: Surgical | Admitting: Surgical

## 2022-12-23 DIAGNOSIS — M79642 Pain in left hand: Secondary | ICD-10-CM | POA: Diagnosis present

## 2022-12-23 MED ORDER — GADOBUTROL 1 MMOL/ML IV SOLN
7.5000 mL | Freq: Once | INTRAVENOUS | Status: AC | PRN
  Administered 2022-12-23: 7.5 mL via INTRAVENOUS

## 2022-12-25 ENCOUNTER — Ambulatory Visit
Admission: EM | Admit: 2022-12-25 | Discharge: 2022-12-25 | Disposition: A | Discharge status: Home / Self Care | Payer: Medicare Other | Attending: Emergency Medicine | Admitting: Emergency Medicine

## 2022-12-25 DIAGNOSIS — R52 Pain, unspecified: Secondary | ICD-10-CM | POA: Diagnosis not present

## 2022-12-25 DIAGNOSIS — T148XXA Other injury of unspecified body region, initial encounter: Secondary | ICD-10-CM | POA: Diagnosis not present

## 2022-12-25 NOTE — Progress Notes (Signed)
Called pt to go over pre-op instructions for his upcoming surgery on Thurs 5/16 with Dr. August Saucer. Pt sts he is currently being treated at an Urgent Care Center, and he will call back to get instructions.

## 2022-12-25 NOTE — Discharge Instructions (Signed)
Follow up with your podiatrist (for your ankle wound) or your pain medicine specialist (for pain management ideas). Keep taking the antibiotics prescribed by the podiatrist and keep doing wound care as instructed by podiatry. Keep wound care appointment on 01/11/23.

## 2022-12-25 NOTE — ED Triage Notes (Signed)
Patient with c/o wound to inside of left ankle. Patient states he was seen by podiatry and they recommended wound care. Patient states he can't get an appointment with wound care until 5/31.

## 2022-12-26 ENCOUNTER — Encounter (HOSPITAL_COMMUNITY): Payer: Self-pay | Admitting: Orthopedic Surgery

## 2022-12-26 ENCOUNTER — Other Ambulatory Visit: Payer: Self-pay

## 2022-12-26 NOTE — Anesthesia Preprocedure Evaluation (Signed)
Anesthesia Evaluation  Patient identified by MRN, date of birth, ID band Patient awake    Reviewed: Allergy & Precautions, NPO status , Patient's Chart, lab work & pertinent test results, reviewed documented beta blocker date and time   Airway Mallampati: III  TM Distance: >3 FB Neck ROM: Full    Dental  (+) Teeth Intact, Caps, Dental Advisory Given   Pulmonary former smoker   breath sounds clear to auscultation       Cardiovascular hypertension, Pt. on medications and Pt. on home beta blockers + CAD and +CHF   Rhythm:Regular Rate:Normal     Neuro/Psych  Headaches  negative psych ROS   GI/Hepatic negative GI ROS, Neg liver ROS,,,  Endo/Other  negative endocrine ROS    Renal/GU negative Renal ROS     Musculoskeletal  (+) Arthritis ,    Abdominal   Peds  Hematology negative hematology ROS (+)   Anesthesia Other Findings   Reproductive/Obstetrics                             Anesthesia Physical Anesthesia Plan  ASA: 3  Anesthesia Plan: General   Post-op Pain Management: Tylenol PO (pre-op)*   Induction: Intravenous  PONV Risk Score and Plan: 3 and Ondansetron and Treatment may vary due to age or medical condition  Airway Management Planned: LMA  Additional Equipment: None  Intra-op Plan:   Post-operative Plan: Extubation in OR  Informed Consent: I have reviewed the patients History and Physical, chart, labs and discussed the procedure including the risks, benefits and alternatives for the proposed anesthesia with the patient or authorized representative who has indicated his/her understanding and acceptance.     Dental advisory given  Plan Discussed with: CRNA  Anesthesia Plan Comments: (PAT note by Antionette Poles, PA-C: 89 year old male with pertinent history including NICM, combined heart failure, HTN, moderate nonobstructive CAD by cath 07/2022.   He was admitted 11/7  through 06/22/2022 with hypertensive urgency and small PE. CTA chest revealed right upper lobar and segmental pulmonary emboli without RV strain.  Echo showed normal RV size and function, but newly discovered LV systolic dysfunction with LVEF 40%.  Cardiology was consulted and GDMT initiated.  Cardiac catheterization was deferred at that time by patient.  He followed up outpatient with Dr. Algie Coffer.  He was subsequently readmitted 12/11 through 07/28/2022 with dyspnea, chest pressure, and hypertensive urgency.  CTA showed previously noted right upper lobe pulmonary emboli had resolved.  EKG with nonspecific changes, troponin negative.  Dr. Algie Coffer was consulted and patient underwent cardiac catheter showing noncritical multivessel disease and medical management was recommended.  Blood pressure was noted to be difficult to control.  He was discharged on losartan, Toprol-XL, Imdur, amlodipine.  He was continued on Eliquis for recent small PE.   On follow-up with his PCP Dr. Okey Dupre on 08/16/2022 pressure was noted to be at goal on regimen of Lasix 20 mg daily, Imdur 30 mg daily, amlodipine 5 mg daily, losartan 50 mg daily, metoprolol 50 mg daily.  He was also noted that the plan was for 6 months of Eliquis after recent PE ending in May 2024.  Last seen by Dr. Okey Dupre on 10/22/2022 and at that time was referred to orthopedic surgery for evaluation of left wrist cyst and carpal tunnel syndrome.   Patient seen by podiatrist Dr. Annamary Rummage on 12/21/2022 for evaluation of venous stasis ulceration of the left medial ankle.  He was placed on a  course of Augmentin for 10 days for concern of infection.  Recommended follow-up with wound care.   Lacie Draft, RN called and spoke with Dr. Diamantina [facility] surgical scheduler to advise patient is currently being treated by podiatry for venous stasis ulcer and possible cellulitis.   Patient will need day of surgery labs and evaluation.   EKG 07/28/2022: Sinus rhythm with first-degree  AV block.  Rate 70.  Minimal voltage criteria for LVH, may be normal variant.  Nonspecific ST abnormality.   CTA 07/24/22: IMPRESSION: 1. No acute findings in the thorax to account for the patient's symptoms. Previously noted right upper lobe pulmonary emboli have resolved compared to prior study 06/20/2022. 2. Aortic atherosclerosis, in addition to left main and three-vessel coronary artery disease. 3. Incidental findings, as above, similar to the prior study.   Cath 07/27/2022:   Mid Cx to Dist Cx lesion is 40% stenosed.   Mid RCA lesion is 40% stenosed.   Mid LAD lesion is 50% stenosed.   There is mild to moderate left ventricular systolic dysfunction.   LV end diastolic pressure is normal.   The left ventricular ejection fraction is 35-45% by visual estimate.   Medical treatment. Resume Apixaban from tomorrow.   TTE 06/20/2022:  1. Left ventricular ejection fraction, by estimation, is 40%. The left  ventricle has mild to moderately decreased function. The left ventricle  demonstrates global hypokinesis. There is mild concentric left ventricular  hypertrophy. Indeterminate  diastolic filling due to E-A fusion.   2. Right ventricular systolic function is normal. The right ventricular  size is normal. Tricuspid regurgitation signal is inadequate for assessing  PA pressure.   3. Left atrial size was mild to moderately dilated.   4. The mitral valve is normal in structure. Trivial mitral valve  regurgitation. No evidence of mitral stenosis.   5. The aortic valve is tricuspid. Aortic valve regurgitation is not  visualized. No aortic stenosis is present.   6. The inferior vena cava is normal in size with greater than 50%  respiratory variability, suggesting right atrial pressure of 3 mmHg.    )        Anesthesia Quick Evaluation

## 2022-12-26 NOTE — Progress Notes (Signed)
Anesthesia Chart Review: Same day workup  89 year old male with pertinent history including NICM, combined heart failure, HTN, moderate nonobstructive CAD by cath 07/2022.  He was admitted 11/7 through 06/22/2022 with hypertensive urgency and small PE. CTA chest revealed right upper lobar and segmental pulmonary emboli without RV strain.  Echo showed normal RV size and function, but newly discovered LV systolic dysfunction with LVEF 40%.  Cardiology was consulted and GDMT initiated.  Cardiac catheterization was deferred at that time by patient.  He followed up outpatient with Dr. Algie Coffer.  He was subsequently readmitted 12/11 through 07/28/2022 with dyspnea, chest pressure, and hypertensive urgency.  CTA showed previously noted right upper lobe pulmonary emboli had resolved.  EKG with nonspecific changes, troponin negative.  Dr. Algie Coffer was consulted and patient underwent cardiac catheter showing noncritical multivessel disease and medical management was recommended.  Blood pressure was noted to be difficult to control.  He was discharged on losartan, Toprol-XL, Imdur, amlodipine.  He was continued on Eliquis for recent small PE.  On follow-up with his PCP Dr. Okey Dupre on 08/16/2022 pressure was noted to be at goal on regimen of Lasix 20 mg daily, Imdur 30 mg daily, amlodipine 5 mg daily, losartan 50 mg daily, metoprolol 50 mg daily.  He was also noted that the plan was for 6 months of Eliquis after recent PE ending in May 2024.  Last seen by Dr. Okey Dupre on 10/22/2022 and at that time was referred to orthopedic surgery for evaluation of left wrist cyst and carpal tunnel syndrome.  Patient seen by podiatrist Dr. Annamary Rummage on 12/21/2022 for evaluation of venous stasis ulceration of the left medial ankle.  He was placed on a course of Augmentin for 10 days for concern of infection.  Recommended follow-up with wound care.  Lacie Draft, RN called and spoke with Dr. Diamantina [facility] surgical scheduler to advise patient  is currently being treated by podiatry for venous stasis ulcer and possible cellulitis.  Patient will need day of surgery labs and evaluation.  EKG 07/28/2022: Sinus rhythm with first-degree AV block.  Rate 70.  Minimal voltage criteria for LVH, may be normal variant.  Nonspecific ST abnormality.  CTA 07/24/22: IMPRESSION: 1. No acute findings in the thorax to account for the patient's symptoms. Previously noted right upper lobe pulmonary emboli have resolved compared to prior study 06/20/2022. 2. Aortic atherosclerosis, in addition to left main and three-vessel coronary artery disease. 3. Incidental findings, as above, similar to the prior study.  Cath 07/27/2022:   Mid Cx to Dist Cx lesion is 40% stenosed.   Mid RCA lesion is 40% stenosed.   Mid LAD lesion is 50% stenosed.   There is mild to moderate left ventricular systolic dysfunction.   LV end diastolic pressure is normal.   The left ventricular ejection fraction is 35-45% by visual estimate.   Medical treatment. Resume Apixaban from tomorrow.  TTE 06/20/2022:  1. Left ventricular ejection fraction, by estimation, is 40%. The left  ventricle has mild to moderately decreased function. The left ventricle  demonstrates global hypokinesis. There is mild concentric left ventricular  hypertrophy. Indeterminate  diastolic filling due to E-A fusion.   2. Right ventricular systolic function is normal. The right ventricular  size is normal. Tricuspid regurgitation signal is inadequate for assessing  PA pressure.   3. Left atrial size was mild to moderately dilated.   4. The mitral valve is normal in structure. Trivial mitral valve  regurgitation. No evidence of mitral stenosis.   5. The aortic  valve is tricuspid. Aortic valve regurgitation is not  visualized. No aortic stenosis is present.   6. The inferior vena cava is normal in size with greater than 50%  respiratory variability, suggesting right atrial pressure of 3 mmHg.      Zannie Cove [facility] Short Stay Center/Anesthesiology Phone [phone] 12/26/2022 12:35 PM

## 2022-12-26 NOTE — Pre-Procedure Instructions (Signed)
SDW CALL  Patient was given pre-op instructions over the phone. The opportunity was given for the patient to ask questions. No further questions asked. Patient verbalized understanding of instructions given.   PCP - Hillard Danker  Cardiologist - Dr Algie Coffer?  PPM/ICD - denies   Chest x-ray - 06/19/22 EKG - 07/28/22 Stress Test -  ECHO - 06/20/22 Cardiac Cath - 07/27/22  Sleep Study - denies   Fasting Blood Sugar - denies   Blood Thinner Instructions: Eliquis last dose 12/22/22 Aspirin Instructions: ASA- 12/22/22  ERAS Protcol - ERAS    COVID TEST- N/A   Anesthesia review: yes  Patient denies shortness of breath, fever, cough and chest pain over the phone call    Surgical Instructions    Your procedure is scheduled on 12/27/22  Report to [facility] Main Entrance "A" at 5:30 A.M., then check in with the Admitting office.  Call this number if you have problems the morning of surgery:  [phone]    Remember:  Do not eat after midnight the night before your surgery  You may drink clear liquids until 5:00AM the morning of your surgery.   Clear liquids allowed are: Water, Non-Citrus Juices (without pulp), Carbonated Beverages, Clear Tea, Black Coffee ONLY (NO MILK, CREAM OR POWDERED CREAMER of any kind), and Gatorade   Take these medicines the morning of surgery with A SIP OF WATER:  Amlodipine, flonase, Imdur, metoprolol, oxycodone, flomax, Sympoic  Follow your surgeons instructions on stopping Eliquis.   As of today, STOP taking any Aspirin (unless otherwise instructed by your surgeon) Aleve, Naproxen, Ibuprofen, Motrin, Advil, Goody's, BC's, all herbal medications, fish oil, and all vitamins.  [facility] is not responsible for any belongings or valuables.    Contacts, glasses, hearing aids, dentures or partials may not be worn into surgery, please bring cases for these belongings   Patients discharged the day of surgery will not be allowed to drive  home, and someone needs to stay with them for 24 hours.   SURGICAL WAITING ROOM VISITATION You may have 1 visitor in the pre-op area at a time determined by the pre-op nurse. (Visitor may not switch out). Patients having surgery or a procedure in a hospital may have two support people in the waiting room.    Special instructions:    Oral Hygiene is also important to reduce your risk of infection.  Remember - BRUSH YOUR TEETH THE MORNING OF SURGERY WITH YOUR REGULAR TOOTHPASTE   Day of Surgery:  Take a shower the day of or night before with antibacterial soap. Wear Clean/Comfortable clothing the morning of surgery Do not apply any deodorants/lotions.   Do not wear jewelry Do not wear lotions, powders, perfumes/colognes, or deodorant. Men may shave face and neck. Do not bring valuables to the hospital. Do not wear nail polish, gel polish, artificial nails, or any other type of covering on natural nails (fingers and toes)

## 2022-12-26 NOTE — Progress Notes (Signed)
I notified Debbie with Dr. Diamantina [facility] office that orders need to be placed for surgery. She will also notify Dr. August Saucer that pt was seen in urgent care yesterday d/t wound to leg.

## 2022-12-26 NOTE — ED Provider Notes (Signed)
[facility]    CSN: [id number] Arrival date & time: 12/25/22  1538      History   Chief Complaint Chief Complaint  Patient presents with   Wound Check    HPI [name] is a 89 y.o. male. He is here requesting something for pain for his chronic stasis ulcer on his left ankle. He was seen by podiatry on 12/21/22 and referred to wound care - has appt at end of May. HAs a pain management contract for his chronic back pain - takes oxycodone.   Wound does not feel worse than usual. States can't get relief unless he applies a hot compress to it. Has been cleaning with betadine as instructed by podiatry but has not been wrapping it because it hurts too much.    Wound Check    Past Medical History:  Diagnosis Date   ACTINIC KERATOSIS, HEAD 03/23/2008   ALLERGIC RHINITIS 03/27/2007   Arthritis    BENIGN PROSTATIC HYPERTROPHY, WITH URINARY OBSTRUCTION 10/16/2007   CAD 08/18/2007   "patient denies any issues with heart, does not see cardiologist"   Carpal tunnel syndrome, bilateral 09/04/2016   CARPAL TUNNEL SYNDROME, LEFT 04/13/2008   CHF (congestive heart failure) (HCC)    Chronic back pain    Chronic neck pain    Complication of anesthesia    left lower jaw teeth have been injured in the past and he has lost those, now has 2 loose front teeth due to a mouth injury on 01/02/20   DVT (deep venous thrombosis) (HCC)    ERECTILE DYSFUNCTION, ORGANIC 02/20/2010   Gout, unspecified 02/20/2010   Headache(784.0)    migraines hx of- started at age 48- none since 11   History of melanoma    HYPERLIPIDEMIA, WITH HIGH HDL 10/18/2008   HYPERTENSION 05/09/2007   sees Dr. 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   Patellar tendinitis 10/10/2009   Sciatic nerve pain    on Oxycodone as needed   SKIN CANCER, HX OF 03/27/2007   UNS ADVRS EFF UNS RX  MEDICINAL&BIOLOGICAL SBSTNC 08/18/2007   Urinary urgency     Patient Active Problem List   Diagnosis Date Noted   HCAP (healthcare-associated pneumonia) 08/16/2022   Chest pain 07/27/2022   CHF (congestive heart failure) (HCC) 07/24/2022   Hx pulmonary embolism 06/20/2022   Hypokalemia 06/20/2022   Abnormal EKG 06/19/2022   Fracture of twelfth thoracic vertebra (HCC) 01/03/2022   Opioid dependence (HCC) 01/03/2022   Poor balance 01/03/2022   Pedal edema 01/03/2022   Bilateral wrist pain 06/19/2021   Fall 01/11/2021   Orthostatic hypotension    Weakness    Osteoporosis 10/07/2020   Lumbar compression fracture (HCC) 09/07/2020   Greater trochanteric bursitis of both hips 05/04/2020   Left patella fracture 01/19/2020   Arthritis of left knee 05/07/2019   Pre-operative clearance 04/28/2019   Acute on chronic systolic CHF (congestive heart failure) (HCC) 08/10/2018   Leg wound, left 03/19/2018   Right arm pain 11/11/2017   Primary osteoarthritis of right foot 08/21/2017   Syncope 02/27/2017   Arthritis of right ankle 12/18/2016   Carpal tunnel syndrome, bilateral 09/04/2016   Leg swelling 08/16/2016   Hand weakness 07/12/2016   Degenerative arthritis of left knee 06/28/2016   Body mass index (bmi) 28.0-28.9, adult 06/18/2016   S/P cervical spinal fusion  06/14/2016   Iron deficiency anemia 03/16/2016   Arthritis of left acromioclavicular joint 01/03/2016   Rotator cuff disorder 01/03/2016   Osteoarthritis of left knee 04/30/2015   Baker's cyst of knee 04/30/2015   Esophageal stricture 04/26/2015   Benign prostatic hyperplasia 02/21/2015   Constipation 02/21/2015   Myalgia and myositis 11/18/2014   Status post lumbar spine operation 06/25/2014   Pseudoarthrosis of cervical spine (HCC) 05/25/2014   Sacral fracture, closed (HCC) 09/23/2013   Spondylolisthesis, congenital 09/02/2013   Polyarticular arthritis 07/19/2013   Spondylosis of lumbosacral spine at multiple levels  without myelopathy 04/24/2013   Chronic radicular low back pain 12/15/2012   Gout 02/20/2010   ERECTILE DYSFUNCTION, ORGANIC 02/20/2010   Hyperlipidemia 10/18/2008   CAD (coronary artery disease) 08/18/2007   Essential hypertension 05/09/2007   ALLERGIC RHINITIS 03/27/2007    Past Surgical History:  Procedure Laterality Date   APPENDECTOMY  1969   APPLICATION OF WOUND VAC Left 01/19/2020   Procedure: Application Of Wound Vac;  Surgeon: Cammy Copa, MD;  Location: [facility] OR;  Service: Orthopedics;  Laterality: Left;   BLEPHAROPLASTY     CARPAL TUNNEL RELEASE Right    CHOLECYSTECTOMY     ESOPHAGOGASTRODUODENOSCOPY (EGD) WITH PROPOFOL N/A 04/26/2015   Procedure: ESOPHAGOGASTRODUODENOSCOPY (EGD) WITH PROPOFOL;  Surgeon: Louis Meckel, MD;  Location: [facility] ENDOSCOPY;  Service: Endoscopy;  Laterality: N/A;   EYE SURGERY     bilateral cataracts removed and vitrectomies   JOINT REPLACEMENT Right    knee   KNEE ARTHROSCOPY     KYPHOPLASTY N/A 06/10/2020   Procedure: Thoracic Twelve, Lumbar One, Lumbar Two Kyphoplasty;  Surgeon: Coletta Memos, MD;  Location: [facility] OR;  Service: Neurosurgery;  Laterality: N/A;  3C/RM 21   KYPHOPLASTY N/A 09/07/2020   Procedure: Lumbar three KYPHOPLASTY;  Surgeon: Coletta Memos, MD;  Location: [facility] OR;  Service: Neurosurgery;  Laterality: N/A;   LAMINECTOMY     LATERAL FUSION LUMBAR SPINE, TRANSVERSE     LEFT HEART CATH AND CORONARY ANGIOGRAPHY N/A 07/27/2022   Procedure: LEFT HEART CATH AND CORONARY ANGIOGRAPHY;  Surgeon: Orpah Cobb, MD;  Location: [facility] INVASIVE CV LAB;  Service: Cardiovascular;  Laterality: N/A;   LUMBAR LAMINECTOMY/DECOMPRESSION MICRODISCECTOMY Left 11/03/2012   Procedure: LUMBAR LAMINECTOMY/DECOMPRESSION MICRODISCECTOMY 1 LEVEL;  Surgeon: Hewitt Shorts, MD;  Location: [facility] NEURO ORS;  Service: Neurosurgery;  Laterality: Left;  LEFT L5S1 laminotomy foraminotomy and possible microdiskectomy   MELANOMA EXCISION     scalp   NASAL SINUS SURGERY      ORIF PATELLA Left 01/19/2020   Procedure: LEFT OPEN REDUCTION INTERNAL (ORIF) FIXATION PATELLA WITH AUTO GRAFTING USING HAMSTRING REINFORCEMENT;  Surgeon: Cammy Copa, MD;  Location: [facility] OR;  Service: Orthopedics;  Laterality: Left;   POSTERIOR CERVICAL FUSION/FORAMINOTOMY Right 10/01/2012   Procedure: POSTERIOR CERVICAL FUSION/FORAMINOTOMY LEVEL 1;  Surgeon: Hewitt Shorts, MD;  Location: [facility] NEURO ORS;  Service: Neurosurgery;  Laterality: Right;  Right Cervical seven thoracic one Cervical laminectomy/foraminotomy with posterior cervical arthrodesis    POSTERIOR LUMBAR FUSION N/A 05/23/2013   Procedure: exploration of lumbar wound, explantation of left interbody implant.;  Surgeon: Hewitt Shorts, MD;  Location: [facility] OR;  Service: Neurosurgery;  Laterality: N/A;   RECTAL SURGERY     Fissure   TOTAL KNEE ARTHROPLASTY Left 05/07/2019   Procedure: LEFT TOTAL KNEE ARTHROPLASTY;  Surgeon: Cammy Copa, MD;  Location: [facility] OR;  Service: Orthopedics;  Laterality: Left;   VITRECTOMY     right and left 2013  Home Medications    Prior to Admission medications   Medication Sig Start Date End Date Taking? Authorizing Provider  amLODipine (NORVASC) 5 MG tablet TAKE 1 TABLET (5 MG TOTAL) BY MOUTH DAILY. 12/17/22 01/16/23  Myrlene Broker, MD  amoxicillin-clavulanate (AUGMENTIN) 875-125 MG tablet Take 1 tablet by mouth 2 (two) times daily. 12/21/22   Standiford, Jenelle Mages, DPM  apixaban (ELIQUIS) 5 MG TABS tablet Take 1 tablet (5 mg total) by mouth 2 (two) times daily. 07/03/22   Myrlene Broker, MD  aspirin EC 81 MG tablet Take 1 tablet (81 mg total) by mouth daily. Swallow whole. 06/22/22   Tyrone Nine, MD  clotrimazole (LOTRIMIN) 1 % cream APPLY TO AFFECTED AREA TWICE A DAY Patient taking differently: Apply 1 Application topically 2 (two) times daily as needed (for dryness). 06/13/22   Pincus Sanes, MD  doxycycline (VIBRA-TABS) 100 MG tablet Take 1 tablet (100 mg total)  by mouth 2 (two) times daily. Patient not taking: Reported on 12/26/2022 11/19/22   Vivi Barrack, DPM  fluticasone [facility]) 50 MCG/ACT nasal spray Place 1 spray into both nostrils daily as needed for allergies.    [provider]  furosemide (LASIX) 20 MG tablet Take 1 tablet (20 mg total) by mouth daily. 07/18/22   Myrlene Broker, MD  isosorbide mononitrate (IMDUR) 30 MG 24 hr tablet Take 1 tablet (30 mg total) by mouth daily. 08/27/22 12/26/23  Myrlene Broker, MD  losartan (COZAAR) 50 MG tablet Take 1 tablet (50 mg total) by mouth daily. 07/18/22   Myrlene Broker, MD  metoprolol succinate (TOPROL XL) 50 MG 24 hr tablet Take 1 tablet (50 mg total) by mouth daily. 07/18/22   Myrlene Broker, MD  Oxycodone HCl 10 MG TABS Take 10-20 mg by mouth See admin instructions. Take 20 mg by mouth in the morning and evening. May take an additional 10 mg once a day around noontime as needed for unresolved pain.    [provider]  predniSONE (DELTASONE) 20 MG tablet Take 2 tablets (40 mg total) by mouth daily with breakfast. Patient not taking: Reported on 12/26/2022 08/14/22   Myrlene Broker, MD  SYMPROIC 0.2 MG TABS Take 0.2 mg by mouth in the morning.    [provider]  tamsulosin (FLOMAX) 0.4 MG CAPS capsule TAKE 1 CAPSULE BY MOUTH EVERY DAY Patient taking differently: Take 0.4 mg by mouth every evening. 11/15/22   Myrlene Broker, MD  triamcinolone cream (KENALOG) 0.1 % Apply 1 Application topically 2 (two) times daily as needed (rough/irritated skin). 12/18/22   [provider]    Family History Family History  Problem Relation Age of Onset   Dementia Mother    Anemia Father    Heart disease Father    Stroke Sister        ICH   Lung disease Sister     Social History Social History   Tobacco Use   Smoking status: Former    Packs/day: 1.00    Years: 33.00    Additional pack years: 0.00    Total pack years: 33.00    Types:  Cigarettes    Quit date: 09/29/1981    Years since quitting: 41.2   Smokeless tobacco: Never  Vaping Use   Vaping Use: Never used  Substance Use Topics   Alcohol use: Yes    Alcohol/week: 21.0 standard drinks of alcohol    Types: 21 Shots of liquor per week  Comment: finishes 1/2L per week dark rum.- 3 shots of liquor a day   Drug use: No     Allergies   Amlodipine, Lyrica [pregabalin], and Gabapentin   Review of Systems Review of Systems   Physical Exam Triage Vital Signs ED Triage Vitals  Enc Vitals Group     BP 12/25/22 1647 125/66     Pulse Rate 12/25/22 1647 82     Resp 12/25/22 1647 18     Temp 12/25/22 1647 97.9 F (36.6 C)     Temp Source 12/25/22 1647 Oral     SpO2 12/25/22 1647 92 %     Weight --      Height --      Head Circumference --      Peak Flow --      Pain Score 12/25/22 1648 8     Pain Loc --      Pain Edu? --      Excl. in GC? --    No data found.  Updated Vital Signs BP 125/66 (BP Location: Left Arm)   Pulse 82   Temp 97.9 F (36.6 C) (Oral)   Resp 18   SpO2 92%   Visual Acuity Right Eye Distance:   Left Eye Distance:   Bilateral Distance:    Right Eye Near:   Left Eye Near:    Bilateral Near:     Physical Exam Constitutional:      Appearance: Normal appearance.  Pulmonary:     Effort: Pulmonary effort is normal.  Feet:     Comments: Medial L ankle stasis ulcer appears unchanged from picture in podiary note 12/21/22 Neurological:     Mental Status: He is alert.      UC Treatments / Results  Labs (all labs ordered are listed, but only abnormal results are displayed) Labs Reviewed - No data to display  EKG   Radiology No results found.  Procedures Procedures (including critical care time)  Medications Ordered in UC Medications - No data to display  Initial Impression / Assessment and Plan / UC Course  I have reviewed the triage vital signs and the nursing notes.  Pertinent labs & imaging results that  were available during my care of the patient were reviewed by me and considered in my medical decision making (see chart for details).     Explained to pt I am not able to offer him pain medicine - he will need to discuss with his pain management provider and/or podiatry. Wound looks unchanged from podiatry eval on 12/21/22.  Pt requests wound care per podiatry instructions - RN will apply betadine gauze and wrap ankle.   Final Clinical Impressions(s) / UC Diagnoses   Final diagnoses:  Pain associated with wound     Discharge Instructions      Follow up with your podiatrist (for your ankle wound) or your pain medicine specialist (for pain management ideas). Keep taking the antibiotics prescribed by the podiatrist and keep doing wound care as instructed by podiatry. Keep wound care appointment on 01/11/23.      ED Prescriptions   None    PDMP not reviewed this encounter.   Cathlyn Parsons, NP 12/26/22 1454

## 2022-12-27 ENCOUNTER — Ambulatory Visit (HOSPITAL_COMMUNITY): Payer: Medicare Other | Admitting: Physician Assistant

## 2022-12-27 ENCOUNTER — Other Ambulatory Visit: Payer: Self-pay

## 2022-12-27 ENCOUNTER — Encounter (HOSPITAL_COMMUNITY): Payer: Self-pay | Admitting: Orthopedic Surgery

## 2022-12-27 ENCOUNTER — Ambulatory Visit (HOSPITAL_BASED_OUTPATIENT_CLINIC_OR_DEPARTMENT_OTHER): Payer: Medicare Other | Admitting: Physician Assistant

## 2022-12-27 ENCOUNTER — Ambulatory Visit (HOSPITAL_COMMUNITY)
Admission: RE | Admit: 2022-12-27 | Discharge: 2022-12-27 | Disposition: A | Discharge status: Home / Self Care | Payer: Medicare Other | Attending: Orthopedic Surgery | Admitting: Orthopedic Surgery

## 2022-12-27 ENCOUNTER — Encounter (HOSPITAL_COMMUNITY)
Admission: RE | Disposition: A | Discharge status: Home / Self Care | Payer: Self-pay | Source: Home / Self Care | Attending: Orthopedic Surgery

## 2022-12-27 DIAGNOSIS — Z87891 Personal history of nicotine dependence: Secondary | ICD-10-CM

## 2022-12-27 DIAGNOSIS — Z79899 Other long term (current) drug therapy: Secondary | ICD-10-CM | POA: Diagnosis not present

## 2022-12-27 DIAGNOSIS — M659 Synovitis and tenosynovitis, unspecified: Secondary | ICD-10-CM | POA: Diagnosis not present

## 2022-12-27 DIAGNOSIS — I110 Hypertensive heart disease with heart failure: Secondary | ICD-10-CM | POA: Insufficient documentation

## 2022-12-27 DIAGNOSIS — I509 Heart failure, unspecified: Secondary | ICD-10-CM

## 2022-12-27 DIAGNOSIS — M67432 Ganglion, left wrist: Secondary | ICD-10-CM

## 2022-12-27 DIAGNOSIS — Z01818 Encounter for other preprocedural examination: Secondary | ICD-10-CM

## 2022-12-27 DIAGNOSIS — I2510 Atherosclerotic heart disease of native coronary artery without angina pectoris: Secondary | ICD-10-CM

## 2022-12-27 DIAGNOSIS — Z7901 Long term (current) use of anticoagulants: Secondary | ICD-10-CM | POA: Insufficient documentation

## 2022-12-27 DIAGNOSIS — Z86711 Personal history of pulmonary embolism: Secondary | ICD-10-CM | POA: Diagnosis not present

## 2022-12-27 DIAGNOSIS — G5602 Carpal tunnel syndrome, left upper limb: Secondary | ICD-10-CM

## 2022-12-27 HISTORY — PX: CARPAL TUNNEL RELEASE: SHX101

## 2022-12-27 LAB — BASIC METABOLIC PANEL
Anion gap: 6 (ref 5–15)
BUN: 16 mg/dL (ref 8–23)
CO2: 23 mmol/L (ref 22–32)
Calcium: 8.9 mg/dL (ref 8.9–10.3)
Chloride: 111 mmol/L (ref 98–111)
Creatinine, Ser: 1.06 mg/dL (ref 0.61–1.24)
GFR, Estimated: >60 mL/min (ref >60)
Glucose, Bld: 101 mg/dL — ABNORMAL HIGH (ref 70–99)
Potassium: 3.8 mmol/L (ref 3.5–5.1)
Sodium: 140 mmol/L (ref 135–145)

## 2022-12-27 LAB — CBC
HCT: 37.1 % — ABNORMAL LOW (ref 39.0–52.0)
Hemoglobin: 11.5 g/dL — ABNORMAL LOW (ref 13.0–17.0)
MCH: 28.7 pg (ref 26.0–34.0)
MCHC: 31 g/dL (ref 30.0–36.0)
MCV: 92.5 fL (ref 80.0–100.0)
Platelets: 330 10*3/uL (ref 150–400)
RBC: 4.01 MIL/uL — ABNORMAL LOW (ref 4.22–5.81)
RDW: 16 % — ABNORMAL HIGH (ref 11.5–15.5)
WBC: 7.7 10*3/uL (ref 4.0–10.5)
nRBC: 0 % (ref 0.0–0.2)

## 2022-12-27 SURGERY — CARPAL TUNNEL RELEASE
Anesthesia: General | Site: Wrist | Laterality: Left

## 2022-12-27 MED ORDER — BUPIVACAINE HCL (PF) 0.25 % IJ SOLN
INTRAMUSCULAR | Status: DC | PRN
  Administered 2022-12-27: 17 mL

## 2022-12-27 MED ORDER — DEXAMETHASONE SODIUM PHOSPHATE 10 MG/ML IJ SOLN
INTRAMUSCULAR | Status: AC
  Filled 2022-12-27: qty 1

## 2022-12-27 MED ORDER — LIDOCAINE 2% (20 MG/ML) 5 ML SYRINGE
INTRAMUSCULAR | Status: AC
  Filled 2022-12-27: qty 5

## 2022-12-27 MED ORDER — PROPOFOL 10 MG/ML IV BOLUS
INTRAVENOUS | Status: AC
  Filled 2022-12-27: qty 20

## 2022-12-27 MED ORDER — ONDANSETRON HCL 4 MG/2ML IJ SOLN
INTRAMUSCULAR | Status: AC
  Filled 2022-12-27: qty 2

## 2022-12-27 MED ORDER — DEXAMETHASONE SODIUM PHOSPHATE 10 MG/ML IJ SOLN
INTRAMUSCULAR | Status: DC | PRN
  Administered 2022-12-27: 5 mg via INTRAVENOUS

## 2022-12-27 MED ORDER — EPINEPHRINE PF 1 MG/ML IJ SOLN
INTRAMUSCULAR | Status: AC
  Filled 2022-12-27: qty 1

## 2022-12-27 MED ORDER — SUCCINYLCHOLINE CHLORIDE 200 MG/10ML IV SOSY
PREFILLED_SYRINGE | INTRAVENOUS | Status: AC
  Filled 2022-12-27: qty 10

## 2022-12-27 MED ORDER — ONDANSETRON HCL 4 MG/2ML IJ SOLN
INTRAMUSCULAR | Status: DC | PRN
  Administered 2022-12-27: 4 mg via INTRAVENOUS

## 2022-12-27 MED ORDER — ACETAMINOPHEN 500 MG PO TABS
1000.0000 mg | ORAL_TABLET | Freq: Once | ORAL | Status: AC
  Administered 2022-12-27: 1000 mg via ORAL
  Filled 2022-12-27: qty 2

## 2022-12-27 MED ORDER — EPHEDRINE 5 MG/ML INJ
INTRAVENOUS | Status: AC
  Filled 2022-12-27: qty 5

## 2022-12-27 MED ORDER — LACTATED RINGERS IV SOLN: INTRAVENOUS | Status: DC | PRN

## 2022-12-27 MED ORDER — BUPIVACAINE HCL (PF) 0.25 % IJ SOLN
INTRAMUSCULAR | Status: AC
  Filled 2022-12-27: qty 30

## 2022-12-27 MED ORDER — POVIDONE-IODINE 10 % EX SWAB
2.0000 | Freq: Once | CUTANEOUS | Status: AC
  Administered 2022-12-27: 2 via TOPICAL

## 2022-12-27 MED ORDER — POVIDONE-IODINE 7.5 % EX SOLN: Freq: Once | CUTANEOUS | Status: DC

## 2022-12-27 MED ORDER — LIDOCAINE 2% (20 MG/ML) 5 ML SYRINGE
INTRAMUSCULAR | Status: DC | PRN
  Administered 2022-12-27: 40 mg via INTRAVENOUS

## 2022-12-27 MED ORDER — PROPOFOL 10 MG/ML IV BOLUS
INTRAVENOUS | Status: DC | PRN
  Administered 2022-12-27: 150 mg via INTRAVENOUS
  Administered 2022-12-27: 50 mg via INTRAVENOUS

## 2022-12-27 MED ORDER — METOPROLOL SUCCINATE ER 25 MG PO TB24
50.0000 mg | ORAL_TABLET | Freq: Once | ORAL | Status: AC
  Administered 2022-12-27: 50 mg via ORAL
  Filled 2022-12-27: qty 2

## 2022-12-27 MED ORDER — FENTANYL CITRATE (PF) 100 MCG/2ML IJ SOLN
INTRAMUSCULAR | Status: AC
  Filled 2022-12-27: qty 2

## 2022-12-27 MED ORDER — 0.9 % SODIUM CHLORIDE (POUR BTL) OPTIME
TOPICAL | Status: DC | PRN
  Administered 2022-12-27: 1000 mL

## 2022-12-27 MED ORDER — FENTANYL CITRATE (PF) 100 MCG/2ML IJ SOLN
INTRAMUSCULAR | Status: DC | PRN
  Administered 2022-12-27: 100 ug via INTRAVENOUS
  Administered 2022-12-27 (×2): 25 ug via INTRAVENOUS

## 2022-12-27 MED ORDER — CHLORHEXIDINE GLUCONATE 0.12 % MT SOLN
OROMUCOSAL | Status: AC
  Administered 2022-12-27: 15 mL
  Filled 2022-12-27: qty 15

## 2022-12-27 MED ORDER — CEFAZOLIN SODIUM-DEXTROSE 2-4 GM/100ML-% IV SOLN
2.0000 g | INTRAVENOUS | Status: AC
  Administered 2022-12-27: 2 g via INTRAVENOUS
  Filled 2022-12-27: qty 100

## 2022-12-27 MED ORDER — EPHEDRINE SULFATE-NACL 50-0.9 MG/10ML-% IV SOSY
PREFILLED_SYRINGE | INTRAVENOUS | Status: DC | PRN
  Administered 2022-12-27: 10 mg via INTRAVENOUS
  Administered 2022-12-27: 5 mg via INTRAVENOUS
  Administered 2022-12-27: 10 mg via INTRAVENOUS

## 2022-12-27 SURGICAL SUPPLY — 55 items
BAG COUNTER SPONGE SURGICOUNT (BAG) ×1 IMPLANT
BAG SPNG CNTER NS LX DISP (BAG)
BAND INSRT 18 STRL LF DISP RB (MISCELLANEOUS) ×1
BAND RUBBER #18 3X1/16 STRL (MISCELLANEOUS) IMPLANT
BNDG CMPR 5X3 KNIT ELC UNQ LF (GAUZE/BANDAGES/DRESSINGS)
BNDG CMPR 9X4 STRL LF SNTH (GAUZE/BANDAGES/DRESSINGS) ×1
BNDG ELASTIC 3INX 5YD STR LF (GAUZE/BANDAGES/DRESSINGS) ×2 IMPLANT
BNDG ELASTIC 4X5.8 VLCR STR LF (GAUZE/BANDAGES/DRESSINGS) ×1 IMPLANT
BNDG ESMARK 4X9 LF (GAUZE/BANDAGES/DRESSINGS) IMPLANT
BNDG GAUZE DERMACEA FLUFF 4 (GAUZE/BANDAGES/DRESSINGS) ×1 IMPLANT
BNDG GZE DERMACEA 4 6PLY (GAUZE/BANDAGES/DRESSINGS)
CORD BIPOLAR FORCEPS 12FT (ELECTRODE) ×1 IMPLANT
COVER SURGICAL LIGHT HANDLE (MISCELLANEOUS) ×1 IMPLANT
CUFF TOURN SGL QUICK 18X4 (TOURNIQUET CUFF) ×1 IMPLANT
CUFF TOURN SGL QUICK 24 (TOURNIQUET CUFF)
CUFF TRNQT CYL 24X4X16.5-23 (TOURNIQUET CUFF) IMPLANT
DRAPE SURG 17X23 STRL (DRAPES) ×1 IMPLANT
DURAPREP 26ML APPLICATOR (WOUND CARE) ×1 IMPLANT
GAUZE PAD ABD 8X10 STRL (GAUZE/BANDAGES/DRESSINGS) IMPLANT
GAUZE SPONGE 4X4 12PLY STRL (GAUZE/BANDAGES/DRESSINGS) ×1 IMPLANT
GAUZE XEROFORM 1X8 LF (GAUZE/BANDAGES/DRESSINGS) ×1 IMPLANT
GLOVE BIOGEL PI IND STRL 8 (GLOVE) ×1 IMPLANT
GLOVE ECLIPSE 8.0 STRL XLNG CF (GLOVE) ×1 IMPLANT
GOWN STRL REUS W/ TWL LRG LVL3 (GOWN DISPOSABLE) ×2 IMPLANT
GOWN STRL REUS W/ TWL XL LVL3 (GOWN DISPOSABLE) ×1 IMPLANT
GOWN STRL REUS W/TWL LRG LVL3 (GOWN DISPOSABLE) ×3
GOWN STRL REUS W/TWL XL LVL3 (GOWN DISPOSABLE)
KIT BASIN OR (CUSTOM PROCEDURE TRAY) ×1 IMPLANT
KIT TURNOVER KIT B (KITS) ×1 IMPLANT
LOOP VASCLR MAXI BLUE 18IN ST (MISCELLANEOUS) IMPLANT
LOOP VASCULAR MAXI 18 BLUE (MISCELLANEOUS)
LOOPS VASCLR MAXI BLUE 18IN ST (MISCELLANEOUS) IMPLANT
NDL HYPO 25GX1X1/2 BEV (NEEDLE) IMPLANT
NEEDLE HYPO 25GX1X1/2 BEV (NEEDLE) ×1 IMPLANT
NS IRRIG 1000ML POUR BTL (IV SOLUTION) ×1 IMPLANT
PACK ORTHO EXTREMITY (CUSTOM PROCEDURE TRAY) ×1 IMPLANT
PAD ARMBOARD 7.5X6 YLW CONV (MISCELLANEOUS) ×2 IMPLANT
PAD CAST 4YDX4 CTTN HI CHSV (CAST SUPPLIES) ×2 IMPLANT
PADDING CAST COTTON 4X4 STRL (CAST SUPPLIES)
SUCTION FRAZIER HANDLE 10FR (MISCELLANEOUS) ×1
SUCTION TUBE FRAZIER 10FR DISP (MISCELLANEOUS) IMPLANT
SUT ETHILON 3 0 PS 1 (SUTURE) ×1 IMPLANT
SUT VIC AB 2-0 CT1 27 (SUTURE) ×1
SUT VIC AB 2-0 CT1 TAPERPNT 27 (SUTURE) IMPLANT
SUT VIC AB 3-0 FS2 27 (SUTURE) IMPLANT
SUT VIC AB 3-0 SH 27 (SUTURE) ×1
SUT VIC AB 3-0 SH 27X BRD (SUTURE) IMPLANT
SYR CONTROL 10ML LL (SYRINGE) IMPLANT
SYSTEM CHEST DRAIN TLS 7FR (DRAIN) IMPLANT
TOWEL GREEN STERILE (TOWEL DISPOSABLE) ×1 IMPLANT
TOWEL GREEN STERILE FF (TOWEL DISPOSABLE) ×1 IMPLANT
TUBE CONNECTING 12X1/4 (SUCTIONS) IMPLANT
UNDERPAD 30X36 HEAVY ABSORB (UNDERPADS AND DIAPERS) ×1 IMPLANT
VASCULAR TIE MAXI BLUE 18IN ST (MISCELLANEOUS)
WATER STERILE IRR 1000ML POUR (IV SOLUTION) ×1 IMPLANT

## 2022-12-27 NOTE — Anesthesia Procedure Notes (Signed)
Procedure Name: LMA Insertion Date/Time: 12/27/2022 8:27 AM  Performed by: Orlin Hilding, CRNAPre-anesthesia Checklist: Patient identified, Emergency Drugs available, Suction available, Timeout performed and Patient being monitored Patient Re-evaluated:Patient Re-evaluated prior to induction Oxygen Delivery Method: Circle system utilized Preoxygenation: Pre-oxygenation with 100% oxygen Induction Type: IV induction LMA: LMA inserted LMA Size: 4.0 Number of attempts: 1 Placement Confirmation: positive ETCO2 and breath sounds checked- equal and bilateral Tube secured with: Tape

## 2022-12-27 NOTE — Transfer of Care (Signed)
Immediate Anesthesia Transfer of Care Note  Patient: [name]  Procedure(s) Performed: CARPAL TUNNEL RELEASE WITH GANGLION CYST EXCISION (Left: Wrist)  Patient Location: PACU  Anesthesia Type:General  Level of Consciousness: drowsy and patient cooperative  Airway & Oxygen Therapy: Patient Spontanous Breathing and Patient connected to face mask oxygen  Post-op Assessment: Report given to RN and Post -op Vital signs reviewed and stable  Post vital signs: Reviewed and stable  Last Vitals:  Vitals Value Taken Time  BP 165/71 12/27/22 0951  Temp    Pulse 64 12/27/22 0953  Resp 13 12/27/22 0953  SpO2 99 % 12/27/22 0953  Vitals shown include unvalidated device data.  Last Pain:  Vitals:   12/27/22 1610  TempSrc: Oral         Complications: No notable events documented.

## 2022-12-27 NOTE — Anesthesia Postprocedure Evaluation (Signed)
Anesthesia Post Note  Patient: [name]  Procedure(s) Performed: CARPAL TUNNEL RELEASE WITH GANGLION CYST EXCISION (Left: Wrist)     Patient location during evaluation: PACU Anesthesia Type: General Level of consciousness: awake and alert Pain management: pain level controlled Vital Signs Assessment: post-procedure vital signs reviewed and stable Respiratory status: spontaneous breathing, nonlabored ventilation, respiratory function stable and patient connected to nasal cannula oxygen Cardiovascular status: blood pressure returned to baseline and stable Postop Assessment: no apparent nausea or vomiting Anesthetic complications: no  No notable events documented.  Last Vitals:  Vitals:   12/27/22 1015 12/27/22 1025  BP: (!) 164/84 (!) 162/84  Pulse: 76 76  Resp: 14 16  Temp: 36.6 C 36.6 C  SpO2: 93% 94%    Last Pain:  Vitals:   12/27/22 1025  TempSrc:   PainSc: 0-No pain                 Shelton Silvas

## 2022-12-27 NOTE — H&P (Signed)
[name] is an 89 y.o. male.   Chief Complaint: Left hand numbness HPI: [name] is an 89 year old patient with left hand numbness.  Has severe carpal tunnel syndrome by EMG nerve study testing.  On MRI scan significant arthritis within the radiocarpal joint is present with inflammatory cystic changes present around the distal volar tendons as they enter into the carpal canal.  Small ganglion cyst is also present in the very distal aspect of Guyon's canal.  Patient describes significant limitations with ADLs due to his loss of dexterity in the hand along with the numbness and tingling.  He has been off of his anticoagulants for approximately 4 days.  Presents for operative management of significant flexor tenosynovitis as well as carpal tunnel syndrome and ganglion cyst.  Past Medical History:  Diagnosis Date   ACTINIC KERATOSIS, HEAD 03/23/2008   ALLERGIC RHINITIS 03/27/2007   Arthritis    BENIGN PROSTATIC HYPERTROPHY, WITH URINARY OBSTRUCTION 10/16/2007   CAD 08/18/2007   "patient denies any issues with heart, does not see cardiologist"   Carpal tunnel syndrome, bilateral 09/04/2016   CARPAL TUNNEL SYNDROME, LEFT 04/13/2008   CHF (congestive heart failure) (HCC)    Chronic back pain    Chronic neck pain    Complication of anesthesia    left lower jaw teeth have been injured in the past and he has lost those, now has 2 loose front teeth due to a mouth injury on 01/02/20   DVT (deep venous thrombosis) (HCC)    ERECTILE DYSFUNCTION, ORGANIC 02/20/2010   Gout, unspecified 02/20/2010   Headache(784.0)    migraines hx of- started at age 51- none since 52   History of melanoma    HYPERLIPIDEMIA, WITH HIGH HDL 10/18/2008   HYPERTENSION 05/09/2007   sees Dr. 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   Patellar tendinitis 10/10/2009   Sciatic nerve pain    on Oxycodone  as needed   SKIN CANCER, HX OF 03/27/2007   UNS ADVRS EFF UNS RX MEDICINAL&BIOLOGICAL SBSTNC 08/18/2007   Urinary urgency     Past Surgical History:  Procedure Laterality Date   APPENDECTOMY  1969   APPLICATION OF WOUND VAC Left 01/19/2020   Procedure: Application Of Wound Vac;  Surgeon: Cammy Copa, MD;  Location: [facility] OR;  Service: Orthopedics;  Laterality: Left;   BLEPHAROPLASTY     CARPAL TUNNEL RELEASE Right    CHOLECYSTECTOMY     ESOPHAGOGASTRODUODENOSCOPY (EGD) WITH PROPOFOL N/A 04/26/2015   Procedure: ESOPHAGOGASTRODUODENOSCOPY (EGD) WITH PROPOFOL;  Surgeon: Louis Meckel, MD;  Location: [facility];  Service: Endoscopy;  Laterality: N/A;   EYE SURGERY     bilateral cataracts removed and vitrectomies   JOINT REPLACEMENT Right    knee   KNEE ARTHROSCOPY     KYPHOPLASTY N/A 06/10/2020   Procedure: Thoracic Twelve, Lumbar One, Lumbar Two Kyphoplasty;  Surgeon: Coletta Memos, MD;  Location: [facility] OR;  Service: Neurosurgery;  Laterality: N/A;  3C/RM 21   KYPHOPLASTY N/A 09/07/2020   Procedure: Lumbar three KYPHOPLASTY;  Surgeon: Coletta Memos, MD;  Location: [facility] OR;  Service: Neurosurgery;  Laterality: N/A;   LAMINECTOMY     LATERAL FUSION LUMBAR SPINE, TRANSVERSE     LEFT HEART CATH AND CORONARY ANGIOGRAPHY N/A 07/27/2022   Procedure: LEFT HEART CATH AND CORONARY ANGIOGRAPHY;  Surgeon: Orpah Cobb, MD;  Location: [facility]  INVASIVE CV LAB;  Service: Cardiovascular;  Laterality: N/A;   LUMBAR LAMINECTOMY/DECOMPRESSION MICRODISCECTOMY Left 11/03/2012   Procedure: LUMBAR LAMINECTOMY/DECOMPRESSION MICRODISCECTOMY 1 LEVEL;  Surgeon: Hewitt Shorts, MD;  Location: [facility] NEURO ORS;  Service: Neurosurgery;  Laterality: Left;  LEFT L5S1 laminotomy foraminotomy and possible microdiskectomy   MELANOMA EXCISION     scalp   NASAL SINUS SURGERY     ORIF PATELLA Left 01/19/2020   Procedure: LEFT OPEN REDUCTION INTERNAL (ORIF) FIXATION PATELLA WITH AUTO GRAFTING USING HAMSTRING REINFORCEMENT;  Surgeon:  Cammy Copa, MD;  Location: [facility] OR;  Service: Orthopedics;  Laterality: Left;   POSTERIOR CERVICAL FUSION/FORAMINOTOMY Right 10/01/2012   Procedure: POSTERIOR CERVICAL FUSION/FORAMINOTOMY LEVEL 1;  Surgeon: Hewitt Shorts, MD;  Location: [facility];  Service: Neurosurgery;  Laterality: Right;  Right Cervical seven thoracic one Cervical laminectomy/foraminotomy with posterior cervical arthrodesis    POSTERIOR LUMBAR FUSION N/A 05/23/2013   Procedure: exploration of lumbar wound, explantation of left interbody implant.;  Surgeon: Hewitt Shorts, MD;  Location: [facility] OR;  Service: Neurosurgery;  Laterality: N/A;   RECTAL SURGERY     Fissure   TOTAL KNEE ARTHROPLASTY Left 05/07/2019   Procedure: LEFT TOTAL KNEE ARTHROPLASTY;  Surgeon: Cammy Copa, MD;  Location: [facility] OR;  Service: Orthopedics;  Laterality: Left;   VITRECTOMY     right and left 2013    Family History  Problem Relation Age of Onset   Dementia Mother    Anemia Father    Heart disease Father    Stroke Sister        ICH   Lung disease Sister    Social History:  reports that he quit smoking about 41 years ago. His smoking use included cigarettes. He has a 33.00 pack-year smoking history. He has never used smokeless tobacco. He reports current alcohol use of about 21.0 standard drinks of alcohol per week. He reports that he does not use drugs.  Allergies:  Allergies  Allergen Reactions   Amlodipine Swelling and Other (See Comments)    Ankle swelling   Lyrica [Pregabalin] Swelling and Other (See Comments)    Hands and feet swell   Gabapentin Other (See Comments)    Lower extremity edema- knees and feet    Medications Prior to Admission  Medication Sig Dispense Refill   amLODipine (NORVASC) 5 MG tablet TAKE 1 TABLET (5 MG TOTAL) BY MOUTH DAILY. 90 tablet 1   amoxicillin-clavulanate (AUGMENTIN) 875-125 MG tablet Take 1 tablet by mouth 2 (two) times daily. 20 tablet 0   aspirin EC 81 MG tablet Take 1 tablet  (81 mg total) by mouth daily. Swallow whole. 30 tablet 0   fluticasone (FLONASE) 50 MCG/ACT nasal spray Place 1 spray into both nostrils daily as needed for allergies.     furosemide (LASIX) 20 MG tablet Take 1 tablet (20 mg total) by mouth daily. 90 tablet 3   isosorbide mononitrate (IMDUR) 30 MG 24 hr tablet Take 1 tablet (30 mg total) by mouth daily. 30 tablet 5   losartan (COZAAR) 50 MG tablet Take 1 tablet (50 mg total) by mouth daily. 90 tablet 3   metoprolol succinate (TOPROL XL) 50 MG 24 hr tablet Take 1 tablet (50 mg total) by mouth daily. 90 tablet 3   Oxycodone HCl 10 MG TABS Take 10-20 mg by mouth See admin instructions. Take 20 mg by mouth in the morning and evening. May take an additional 10 mg once a day around noontime as needed for unresolved  pain.     SYMPROIC 0.2 MG TABS Take 0.2 mg by mouth in the morning.     tamsulosin (FLOMAX) 0.4 MG CAPS capsule TAKE 1 CAPSULE BY MOUTH EVERY DAY (Patient taking differently: Take 0.4 mg by mouth every evening.) 90 capsule 1   triamcinolone cream (KENALOG) 0.1 % Apply 1 Application topically 2 (two) times daily as needed (rough/irritated skin).     apixaban (ELIQUIS) 5 MG TABS tablet Take 1 tablet (5 mg total) by mouth 2 (two) times daily. 180 tablet 1   clotrimazole (LOTRIMIN) 1 % cream APPLY TO AFFECTED AREA TWICE A DAY (Patient taking differently: Apply 1 Application topically 2 (two) times daily as needed (for dryness).) 30 g 0   doxycycline (VIBRA-TABS) 100 MG tablet Take 1 tablet (100 mg total) by mouth 2 (two) times daily. (Patient not taking: Reported on 12/26/2022) 14 tablet 0   predniSONE (DELTASONE) 20 MG tablet Take 2 tablets (40 mg total) by mouth daily with breakfast. (Patient not taking: Reported on 12/26/2022) 14 tablet 0    Results for orders placed or performed during the hospital encounter of 12/27/22 (from the past 48 hour(s))  CBC     Status: Abnormal   Collection Time: 12/27/22  6:53 AM  Result Value Ref Range   WBC 7.7  4.0 - 10.5 K/uL   RBC 4.01 (L) 4.22 - 5.81 MIL/uL   Hemoglobin 11.5 (L) 13.0 - 17.0 g/dL   HCT 27.2 (L) 53.6 - 64.4 %   MCV 92.5 80.0 - 100.0 fL   MCH 28.7 26.0 - 34.0 pg   MCHC 31.0 30.0 - 36.0 g/dL   RDW 03.4 (H) 74.2 - 59.5 %   Platelets 330 150 - 400 K/uL   nRBC 0.0 0.0 - 0.2 %    Comment: Performed at [facility], [address] [place], [place] [address]   *Note: Due to a large number of results and/or encounters for the requested time period, some results have not been displayed. A complete set of results can be found in Results Review.   No results found.  Review of Systems  Musculoskeletal:  Positive for arthralgias.  All other systems reviewed and are negative.   Blood pressure (!) 188/78, pulse 68, temperature 98.1 F (36.7 C), temperature source Oral, resp. rate 20, height 6' (1.829 m), weight 88.5 kg, SpO2 96 %. Physical Exam Vitals reviewed.  HENT:     Head: Normocephalic.     Nose: Nose normal.     Mouth/Throat:     Mouth: Mucous membranes are moist.  Cardiovascular:     Rate and Rhythm: Normal rate.     Pulses: Normal pulses.  Pulmonary:     Effort: Pulmonary effort is normal.  Abdominal:     General: Abdomen is flat.  Musculoskeletal:     Cervical back: Normal range of motion.  Skin:    General: Skin is warm.     Capillary Refill: Capillary refill takes less than 2 seconds.  Neurological:     General: No focal deficit present.     Mental Status: He is alert.   Ortho exam demonstrates left hand with intact EPL, FPL, finger abduction.  Large area of swelling noted on the volar aspect of the distal forearm just proximal to the wrist.  This is nontender and there is no associated cellulitis or erythema.  States that this has been here for about 10 years.  Positive Tinel sign.  Positive Durkan sign.  2+ radial pulse  of the left upper extremity.    Assessment/Plan Impression is left carpal tunnel syndrome due to mass effect from expansile  tenosynovitis within the distal volar forearm.  Small ganglion cyst also present in the distal aspect of Guyon's canal.  Plan is for carpal tunnel release with extension of the incision proximal to the wrist flexion crease to decompress the flexor tenosynovitis and also decompress the ganglion cyst distally.  The risk and benefits are discussed with the patient include not limited to infection or vessel damage delayed or incomplete restoration of sensation and function to the fingers.  I did discuss with [name] at length how sometimes in these cases wears the carpal tunnel compression is severe that it can take months for functional recovery to occur.  Patient understands the risk and benefits and wishes to proceed.  There is also a significant component of arthritis within the wrist joint which is contributing to the problem which we cannot address at this time.  Patient understands and essentially just wants a chance that improved hand function.  All questions answered  Burnard Bunting, MD 12/27/2022, 7:32 AM

## 2022-12-28 ENCOUNTER — Encounter (HOSPITAL_COMMUNITY): Payer: Self-pay | Admitting: Orthopedic Surgery

## 2022-12-28 ENCOUNTER — Other Ambulatory Visit: Payer: Self-pay | Admitting: Internal Medicine

## 2022-12-28 NOTE — Brief Op Note (Signed)
   12/27/2022  8:26 AM  PATIENT:  [name]  89 y.o. male  PRE-OPERATIVE DIAGNOSIS:  left carpal tunnel syndrome with severe tenosynovitis  POST-OPERATIVE DIAGNOSIS:  same PROCEDURE:  Procedure(s): CARPAL TUNNEL RELEASE WITH cyst decompression and tenosynovectomy  SURGEON:  Surgeon(s): Cammy Copa, MD  ASSISTANT: magnant pa  ANESTHESIA:   general  EBL: 10 ml    Total I/O In: 1100 [I.V.:1000; IV Piggyback:100] Out: 5 [Blood:5]  BLOOD ADMINISTERED: none  DRAINS: none   LOCAL MEDICATIONS USED:  plain marcaine  SPECIMEN:  No Specimen  COUNTS:  YES  TOURNIQUET:   Total Tourniquet Time Documented: Upper Arm (Left) - 33 minutes Total: Upper Arm (Left) - 33 minutes   DICTATION: .Other Dictation: Dictation Number pending  PLAN OF CARE: Discharge to home after PACU  PATIENT DISPOSITION:  PACU - hemodynamically stable

## 2023-01-01 ENCOUNTER — Ambulatory Visit: Payer: Medicare Other | Admitting: Internal Medicine

## 2023-01-02 NOTE — Op Note (Signed)
NAME: NOUR, THEEL MEDICAL RECORD NO: [id number] ACCOUNT NO: [id number] DATE OF BIRTH: [birth year] FACILITY: [facility] LOCATION: [facility] PHYSICIAN: Graylin Shiver. August Saucer, MD  Operative Report   DATE OF PROCEDURE: 12/27/2022  PREOPERATIVE DIAGNOSIS:  Left carpal tunnel syndrome with severe tenosynovitis and ganglion cyst.  POSTOPERATIVE DIAGNOSIS:  Left carpal tunnel syndrome with severe tenosynovitis and ganglion cyst.  PROCEDURE:   1.  Left carpal tunnel release. 2.  Severe tenosynovitis debridement. 3.  Ganglion cyst decompression.  SURGEON:  Graylin Shiver. August Saucer, MD  ASSISTANT:  Karenann Cai, PA.  INDICATIONS:  This is a patient with left wrist pain, who presents for operative management after explanation of risks and benefits.  EMG studies show severe carpal tunnel syndrome and MRI scan shows severe flexor tenosynovitis with ganglion cyst and  fluid encircling the flexor tendons just proximal to the wrist flexion crease.  DESCRIPTION OF PROCEDURE:  The patient was brought to the operating room where general anesthetic was induced.  Preoperative antibiotics administered.  Timeout was called.  Left wrist was prescrubbed with alcohol and Betadine, allowed to air dry.   Prepped with DuraPrep solution and draped in sterile manner.  Timeout was called.  After calling timeout, the arm was elevated and exsanguinated with the Esmarch wrap.  Tourniquet was inflated.  Total tourniquet time of 33 minutes.  Incision made  beginning at the junction of the radial border of the fourth finger in line with intersecting Kaplan's cardinal line along the radial border of the fourth finger.  This was taken down to the distal wrist flexion crease and crossed at a 90 degree angle  and then that incision was extended proximally about 7 cm.  Distally skin and subcutaneous tissue sharply divided.  Palmar fascia was encountered and divided.  The palmaris brevis was visualized and divided.  Transverse carpal ligament was  then divided  along its mid section about 2-3 mm and subsequently a right angle retractor was placed between the median nerve and the transverse carpal ligament, which was then released directly under direct visualization out to the neurovascular bundle.  No  compression of the ulnar neurovascular bundle was visualized on the ulnar aspect of the carpal tunnel release.  Then, this was extended proximally down to the forearm fascia through the prior incision.  Full release of that median nerve was completed.   Attention was then directed towards the ganglion cyst, which was enclosing the flexor tendons.  Significant flexor tenosynovitis was present.  This was debrided along with the cyst with care being taken to avoid injury to the median nerve.  Full  decompression was achieved and the cyst was removed.  Tenosynovitis of the flexor tendons was also debrided.  At this time, thorough irrigation was performed.  Skin edges were anesthetized using Marcaine without epinephrine.  Tourniquet was released and  bleeding points encountered were controlled using bipolar electrocautery.  No other masses present within the carpal canal.  The incision was then closed using interrupted 3-0 Vicryl suture followed by 3-0 nylon.  Well padded splint was applied.  The  patient tolerated the procedure well without immediate complications.  Luke's assistance was required at all times for retraction, opening, closing, mobilization of tissue.  His assistance was a medical necessity.   PUS D: 01/02/2023 12:50:09 pm T: 01/02/2023 1:18:00 pm  JOB: [id number]/ [id number]

## 2023-01-04 ENCOUNTER — Ambulatory Visit (INDEPENDENT_AMBULATORY_CARE_PROVIDER_SITE_OTHER): Payer: Medicare Other | Admitting: Surgical

## 2023-01-04 DIAGNOSIS — Z9889 Other specified postprocedural states: Secondary | ICD-10-CM

## 2023-01-06 ENCOUNTER — Encounter: Payer: Self-pay | Admitting: Surgical

## 2023-01-06 NOTE — Progress Notes (Signed)
Post-Op Visit Note   Patient: [name]           Date of Birth: 02/08/1932           MRN: [id number] Visit Date: 01/04/2023 PCP: Nona Dell, NP   Assessment & Plan:  Chief Complaint:  Chief Complaint  Patient presents with   Left Hand - Routine Post Op   Visit Diagnoses:  1. S/P carpal tunnel release     Plan: Patient is an 89 year old male who presents s/p left hand carpal tunnel release and tenosynovectomy performed on 12/27/2022.  Patient states that he feels a lot better in regards to the left hand symptoms he was having.  Having markedly reduced numbness and tingling and he feels a lot more functional with his left hand for daily activities such as buttoning his shirt and lifting small objects.  He is very satisfied with how his hand feels.  He does have some swelling around the proximal aspect of his incision without any significant redness or any expressible drainage.  No fevers or chills.  He feels well and he is eating without difficulty.  On exam, incision looks to be healing well without evidence of infection or dehiscence.  Too soon to take the sutures out.  He has intact EPL, FPL, finger abduction, grip strength testing without weakness.  Incision is fairly well-healed proximally but still has a little bit of gapping of the superficial skin edges distally so we will leave sutures alone for now.  Remove sutures next Tuesday 4 days from now and then follow-up with Dr. August Saucer a week after that.  He was given a wrist brace to wear at all times to help support and protect the incision when he is ambulatory since he has to get around with a walker.  Follow-Up Instructions: No follow-ups on file.   Orders:  No orders of the defined types were placed in this encounter.  No orders of the defined types were placed in this encounter.   Imaging: No results found.  PMFS History: Patient Active Problem List   Diagnosis Date Noted   HCAP (healthcare-associated  pneumonia) 08/16/2022   Chest pain 07/27/2022   CHF (congestive heart failure) (HCC) 07/24/2022   Hx pulmonary embolism 06/20/2022   Hypokalemia 06/20/2022   Abnormal EKG 06/19/2022   Fracture of twelfth thoracic vertebra (HCC) 01/03/2022   Opioid dependence (HCC) 01/03/2022   Poor balance 01/03/2022   Pedal edema 01/03/2022   Bilateral wrist pain 06/19/2021   Fall 01/11/2021   Orthostatic hypotension    Weakness    Osteoporosis 10/07/2020   Lumbar compression fracture (HCC) 09/07/2020   Greater trochanteric bursitis of both hips 05/04/2020   Left patella fracture 01/19/2020   Arthritis of left knee 05/07/2019   Pre-operative clearance 04/28/2019   Acute on chronic systolic CHF (congestive heart failure) (HCC) 08/10/2018   Leg wound, left 03/19/2018   Right arm pain 11/11/2017   Primary osteoarthritis of right foot 08/21/2017   Syncope 02/27/2017   Arthritis of right ankle 12/18/2016   Carpal tunnel syndrome, bilateral 09/04/2016   Leg swelling 08/16/2016   Hand weakness 07/12/2016   Degenerative arthritis of left knee 06/28/2016   Body mass index (bmi) 28.0-28.9, adult 06/18/2016   S/P cervical spinal fusion 06/14/2016   Iron deficiency anemia 03/16/2016   Arthritis of left acromioclavicular joint 01/03/2016   Rotator cuff disorder 01/03/2016   Osteoarthritis of left knee 04/30/2015   Baker's cyst of knee 04/30/2015   Esophageal  stricture 04/26/2015   Benign prostatic hyperplasia 02/21/2015   Constipation 02/21/2015   Myalgia and myositis 11/18/2014   Status post lumbar spine operation 06/25/2014   Pseudoarthrosis of cervical spine (HCC) 05/25/2014   Sacral fracture, closed (HCC) 09/23/2013   Spondylolisthesis, congenital 09/02/2013   Polyarticular arthritis 07/19/2013   Spondylosis of lumbosacral spine at multiple levels without myelopathy 04/24/2013   Chronic radicular low back pain 12/15/2012   Gout 02/20/2010   ERECTILE DYSFUNCTION, ORGANIC 02/20/2010    Hyperlipidemia 10/18/2008   CAD (coronary artery disease) 08/18/2007   Essential hypertension 05/09/2007   ALLERGIC RHINITIS 03/27/2007   Past Medical History:  Diagnosis Date   ACTINIC KERATOSIS, HEAD 03/23/2008   ALLERGIC RHINITIS 03/27/2007   Arthritis    BENIGN PROSTATIC HYPERTROPHY, WITH URINARY OBSTRUCTION 10/16/2007   CAD 08/18/2007   "patient denies any issues with heart, does not see cardiologist"   Carpal tunnel syndrome, bilateral 09/04/2016   CARPAL TUNNEL SYNDROME, LEFT 04/13/2008   CHF (congestive heart failure) (HCC)    Chronic back pain    Chronic neck pain    Complication of anesthesia    left lower jaw teeth have been injured in the past and he has lost those, now has 2 loose front teeth due to a mouth injury on 01/02/20   DVT (deep venous thrombosis) (HCC)    ERECTILE DYSFUNCTION, ORGANIC 02/20/2010   Gout, unspecified 02/20/2010   Headache(784.0)    migraines hx of- started at age 52- none since 24   History of melanoma    HYPERLIPIDEMIA, WITH HIGH HDL 10/18/2008   HYPERTENSION 05/09/2007   sees Dr. 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   Patellar tendinitis 10/10/2009   Sciatic nerve pain    on Oxycodone as needed   SKIN CANCER, HX OF 03/27/2007   UNS ADVRS EFF UNS RX MEDICINAL&BIOLOGICAL SBSTNC 08/18/2007   Urinary urgency     Family History  Problem Relation Age of Onset   Dementia Mother    Anemia Father    Heart disease Father    Stroke Sister        ICH   Lung disease Sister     Past Surgical History:  Procedure Laterality Date   APPENDECTOMY  1969   APPLICATION OF WOUND VAC Left 01/19/2020   Procedure: Application Of Wound Vac;  Surgeon: Cammy Copa, MD;  Location[facility] OR;  Service: Orthopedics;  Laterality: Left;   BLEPHAROPLASTY     CARPAL TUNNEL RELEASE Right    CARPAL TUNNEL RELEASE Left 12/27/2022    Procedure: CARPAL TUNNEL RELEASE WITH GANGLION CYST EXCISION;  Surgeon: Cammy Copa, MD;  Location: [facility] OR;  Service: Orthopedics;  Laterality: Left;   CHOLECYSTECTOMY     ESOPHAGOGASTRODUODENOSCOPY (EGD) WITH PROPOFOL N/A 04/26/2015   Procedure: ESOPHAGOGASTRODUODENOSCOPY (EGD) WITH PROPOFOL;  Surgeon: Louis Meckel, MD;  Location: [facility];  Service: Endoscopy;  Laterality: N/A;   EYE SURGERY     bilateral cataracts removed and vitrectomies   JOINT REPLACEMENT Right    knee   KNEE ARTHROSCOPY     KYPHOPLASTY N/A 06/10/2020   Procedure: Thoracic Twelve, Lumbar One, Lumbar Two Kyphoplasty;  Surgeon: Coletta Memos, MD;  Location: [facility] OR;  Service: Neurosurgery;  Laterality: N/A;  3C/RM 21   KYPHOPLASTY N/A 09/07/2020   Procedure: Lumbar three KYPHOPLASTY;  Surgeon: Coletta Memos, MD;  Location: [facility] OR;  Service: Neurosurgery;  Laterality: N/A;   LAMINECTOMY     LATERAL FUSION LUMBAR SPINE, TRANSVERSE     LEFT HEART CATH AND CORONARY ANGIOGRAPHY N/A 07/27/2022   Procedure: LEFT HEART CATH AND CORONARY ANGIOGRAPHY;  Surgeon: Orpah Cobb, MD;  Location: [facility];  Service: Cardiovascular;  Laterality: N/A;   LUMBAR LAMINECTOMY/DECOMPRESSION MICRODISCECTOMY Left 11/03/2012   Procedure: LUMBAR LAMINECTOMY/DECOMPRESSION MICRODISCECTOMY 1 LEVEL;  Surgeon: Hewitt Shorts, MD;  Location: [facility];  Service: Neurosurgery;  Laterality: Left;  LEFT L5S1 laminotomy foraminotomy and possible microdiskectomy   MELANOMA EXCISION     scalp   NASAL SINUS SURGERY     ORIF PATELLA Left 01/19/2020   Procedure: LEFT OPEN REDUCTION INTERNAL (ORIF) FIXATION PATELLA WITH AUTO GRAFTING USING HAMSTRING REINFORCEMENT;  Surgeon: Cammy Copa, MD;  Location: [facility] OR;  Service: Orthopedics;  Laterality: Left;   POSTERIOR CERVICAL FUSION/FORAMINOTOMY Right 10/01/2012   Procedure: POSTERIOR CERVICAL FUSION/FORAMINOTOMY LEVEL 1;  Surgeon: Hewitt Shorts, MD;  Location: [facility];  Service:  Neurosurgery;  Laterality: Right;  Right Cervical seven thoracic one Cervical laminectomy/foraminotomy with posterior cervical arthrodesis    POSTERIOR LUMBAR FUSION N/A 05/23/2013   Procedure: exploration of lumbar wound, explantation of left interbody implant.;  Surgeon: Hewitt Shorts, MD;  Location: [facility] OR;  Service: Neurosurgery;  Laterality: N/A;   RECTAL SURGERY     Fissure   TOTAL KNEE ARTHROPLASTY Left 05/07/2019   Procedure: LEFT TOTAL KNEE ARTHROPLASTY;  Surgeon: Cammy Copa, MD;  Location: [facility] OR;  Service: Orthopedics;  Laterality: Left;   VITRECTOMY     right and left 2013   Social History   Occupational History   Occupation: Retired  Tobacco Use   Smoking status: Former    Packs/day: 1.00    Years: 33.00    Additional pack years: 0.00    Total pack years: 33.00    Types: Cigarettes    Quit date: 09/29/1981    Years since quitting: 41.2   Smokeless tobacco: Never  Vaping Use   Vaping Use: Never used  Substance and Sexual Activity   Alcohol use: Yes    Alcohol/week: 21.0 standard drinks of alcohol    Types: 21 Shots of liquor per week    Comment: finishes 1/2L per week dark rum.- 3 shots of liquor a day   Drug use: No   Sexual activity: Not Currently    Partners: Female    Comment: Married

## 2023-01-08 ENCOUNTER — Ambulatory Visit: Payer: Medicare Other

## 2023-01-08 ENCOUNTER — Ambulatory Visit (INDEPENDENT_AMBULATORY_CARE_PROVIDER_SITE_OTHER): Payer: Medicare Other | Admitting: Orthopaedic Surgery

## 2023-01-08 ENCOUNTER — Telehealth: Payer: Self-pay

## 2023-01-08 ENCOUNTER — Encounter: Payer: Self-pay | Admitting: Orthopaedic Surgery

## 2023-01-08 VITALS — BP 129/75 | Temp 98.3°F | Ht 72.0 in | Wt 198.0 lb

## 2023-01-08 DIAGNOSIS — G5603 Carpal tunnel syndrome, bilateral upper limbs: Secondary | ICD-10-CM

## 2023-01-08 NOTE — Progress Notes (Signed)
Post-Op Visit Note   Patient: [name]           Date of Birth: 11/02/1931           MRN: [id number] Visit Date: 01/08/2023 PCP: Nona Dell, NP   Assessment & Plan: Patient of Dr. August Saucer had 12/27/2022 left hand carpal tunnel release and tenosynovectomy.  He is here with considerable serous drainage coming out over several 4 x 4's also washcloth.  No definite purulence he has some mild erythema and in the proximal portion decision some prominence with likely some tenosynovium fluid.  No definite purulent drainage.  He has a history of having positive PCR x 2 for MSSA but always has been negative for MRSA.  He has leg ulcer and is on chronic Augmentin currently for this.  New dressing applied we discussed using his hand less keeping his hand elevated.  He can see Dr. August Saucer tomorrow.  Chief Complaint:  Chief Complaint  Patient presents with   Left Hand - Wound Check    12/27/2022 Left hand CTR and tenosynovectomy    Visit Diagnoses:  1. Carpal tunnel syndrome, bilateral     Plan: Follow-up with Dr. August Saucer tomorrow  Follow-Up Instructions: No follow-ups on file.   Orders:  No orders of the defined types were placed in this encounter.  No orders of the defined types were placed in this encounter.   Imaging: No results found.  PMFS History: Patient Active Problem List   Diagnosis Date Noted   HCAP (healthcare-associated pneumonia) 08/16/2022   Chest pain 07/27/2022   CHF (congestive heart failure) (HCC) 07/24/2022   Hx pulmonary embolism 06/20/2022   Hypokalemia 06/20/2022   Abnormal EKG 06/19/2022   Fracture of twelfth thoracic vertebra (HCC) 01/03/2022   Opioid dependence (HCC) 01/03/2022   Poor balance 01/03/2022   Pedal edema 01/03/2022   Bilateral wrist pain 06/19/2021   Fall 01/11/2021   Orthostatic hypotension    Weakness    Osteoporosis 10/07/2020   Lumbar compression fracture (HCC) 09/07/2020   Greater trochanteric bursitis of both hips 05/04/2020    Left patella fracture 01/19/2020   Arthritis of left knee 05/07/2019   Pre-operative clearance 04/28/2019   Acute on chronic systolic CHF (congestive heart failure) (HCC) 08/10/2018   Leg wound, left 03/19/2018   Right arm pain 11/11/2017   Primary osteoarthritis of right foot 08/21/2017   Syncope 02/27/2017   Arthritis of right ankle 12/18/2016   Carpal tunnel syndrome, bilateral 09/04/2016   Leg swelling 08/16/2016   Hand weakness 07/12/2016   Degenerative arthritis of left knee 06/28/2016   Body mass index (bmi) 28.0-28.9, adult 06/18/2016   S/P cervical spinal fusion 06/14/2016   Iron deficiency anemia 03/16/2016   Arthritis of left acromioclavicular joint 01/03/2016   Rotator cuff disorder 01/03/2016   Osteoarthritis of left knee 04/30/2015   Baker's cyst of knee 04/30/2015   Esophageal stricture 04/26/2015   Benign prostatic hyperplasia 02/21/2015   Constipation 02/21/2015   Myalgia and myositis 11/18/2014   Status post lumbar spine operation 06/25/2014   Pseudoarthrosis of cervical spine (HCC) 05/25/2014   Sacral fracture, closed (HCC) 09/23/2013   Spondylolisthesis, congenital 09/02/2013   Polyarticular arthritis 07/19/2013   Spondylosis of lumbosacral spine at multiple levels without myelopathy 04/24/2013   Chronic radicular low back pain 12/15/2012   Gout 02/20/2010   ERECTILE DYSFUNCTION, ORGANIC 02/20/2010   Hyperlipidemia 10/18/2008   CAD (coronary artery disease) 08/18/2007   Essential hypertension 05/09/2007   ALLERGIC RHINITIS 03/27/2007  Past Medical History:  Diagnosis Date   ACTINIC KERATOSIS, HEAD 03/23/2008   ALLERGIC RHINITIS 03/27/2007   Arthritis    BENIGN PROSTATIC HYPERTROPHY, WITH URINARY OBSTRUCTION 10/16/2007   CAD 08/18/2007   "patient denies any issues with heart, does not see cardiologist"   Carpal tunnel syndrome, bilateral 09/04/2016   CARPAL TUNNEL SYNDROME, LEFT 04/13/2008   CHF (congestive heart failure) (HCC)    Chronic back pain     Chronic neck pain    Complication of anesthesia    left lower jaw teeth have been injured in the past and he has lost those, now has 2 loose front teeth due to a mouth injury on 01/02/20   DVT (deep venous thrombosis) (HCC)    ERECTILE DYSFUNCTION, ORGANIC 02/20/2010   Gout, unspecified 02/20/2010   Headache(784.0)    migraines hx of- started at age 42- none since 65   History of melanoma    HYPERLIPIDEMIA, WITH HIGH HDL 10/18/2008   HYPERTENSION 05/09/2007   sees Dr. 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   Patellar tendinitis 10/10/2009   Sciatic nerve pain    on Oxycodone as needed   SKIN CANCER, HX OF 03/27/2007   UNS ADVRS EFF UNS RX MEDICINAL&BIOLOGICAL SBSTNC 08/18/2007   Urinary urgency     Family History  Problem Relation Age of Onset   Dementia Mother    Anemia Father    Heart disease Father    Stroke Sister        ICH   Lung disease Sister     Past Surgical History:  Procedure Laterality Date   APPENDECTOMY  1969   APPLICATION OF WOUND VAC Left 01/19/2020   Procedure: Application Of Wound Vac;  Surgeon: Cammy Copa, MD;  Location: [facility] OR;  Service: Orthopedics;  Laterality: Left;   BLEPHAROPLASTY     CARPAL TUNNEL RELEASE Right    CARPAL TUNNEL RELEASE Left 12/27/2022   Procedure: CARPAL TUNNEL RELEASE WITH GANGLION CYST EXCISION;  Surgeon: Cammy Copa, MD;  Location: [facility] OR;  Service: Orthopedics;  Laterality: Left;   CHOLECYSTECTOMY     ESOPHAGOGASTRODUODENOSCOPY (EGD) WITH PROPOFOL N/A 04/26/2015   Procedure: ESOPHAGOGASTRODUODENOSCOPY (EGD) WITH PROPOFOL;  Surgeon: Louis Meckel, MD;  Location: [facility];  Service: Endoscopy;  Laterality: N/A;   EYE SURGERY     bilateral cataracts removed and vitrectomies   JOINT REPLACEMENT Right    knee   KNEE ARTHROSCOPY     KYPHOPLASTY N/A 06/10/2020   Procedure: Thoracic  Twelve, Lumbar One, Lumbar Two Kyphoplasty;  Surgeon: Coletta Memos, MD;  Location: [facility] OR;  Service: Neurosurgery;  Laterality: N/A;  3C/RM 21   KYPHOPLASTY N/A 09/07/2020   Procedure: Lumbar three KYPHOPLASTY;  Surgeon: Coletta Memos, MD;  Location: [facility] OR;  Service: Neurosurgery;  Laterality: N/A;   LAMINECTOMY     LATERAL FUSION LUMBAR SPINE, TRANSVERSE     LEFT HEART CATH AND CORONARY ANGIOGRAPHY N/A 07/27/2022   Procedure: LEFT HEART CATH AND CORONARY ANGIOGRAPHY;  Surgeon: Orpah Cobb, MD;  Location: [facility];  Service: Cardiovascular;  Laterality: N/A;   LUMBAR LAMINECTOMY/DECOMPRESSION MICRODISCECTOMY Left 11/03/2012   Procedure: LUMBAR LAMINECTOMY/DECOMPRESSION MICRODISCECTOMY 1 LEVEL;  Surgeon: Hewitt Shorts, MD;  Location: [facility];  Service: Neurosurgery;  Laterality: Left;  LEFT L5S1 laminotomy foraminotomy and possible microdiskectomy   MELANOMA EXCISION  scalp   NASAL SINUS SURGERY     ORIF PATELLA Left 01/19/2020   Procedure: LEFT OPEN REDUCTION INTERNAL (ORIF) FIXATION PATELLA WITH AUTO GRAFTING USING HAMSTRING REINFORCEMENT;  Surgeon: Cammy Copa, MD;  Location: [facility] OR;  Service: Orthopedics;  Laterality: Left;   POSTERIOR CERVICAL FUSION/FORAMINOTOMY Right 10/01/2012   Procedure: POSTERIOR CERVICAL FUSION/FORAMINOTOMY LEVEL 1;  Surgeon: Hewitt Shorts, MD;  Location: [facility];  Service: Neurosurgery;  Laterality: Right;  Right Cervical seven thoracic one Cervical laminectomy/foraminotomy with posterior cervical arthrodesis    POSTERIOR LUMBAR FUSION N/A 05/23/2013   Procedure: exploration of lumbar wound, explantation of left interbody implant.;  Surgeon: Hewitt Shorts, MD;  Location: [facility] OR;  Service: Neurosurgery;  Laterality: N/A;   RECTAL SURGERY     Fissure   TOTAL KNEE ARTHROPLASTY Left 05/07/2019   Procedure: LEFT TOTAL KNEE ARTHROPLASTY;  Surgeon: Cammy Copa, MD;  Location: [facility] OR;  Service: Orthopedics;  Laterality: Left;    VITRECTOMY     right and left 2013   Social History   Occupational History   Occupation: Retired  Tobacco Use   Smoking status: Former    Packs/day: 1.00    Years: 33.00    Additional pack years: 0.00    Total pack years: 33.00    Types: Cigarettes    Quit date: 09/29/1981    Years since quitting: 41.3   Smokeless tobacco: Never  Vaping Use   Vaping Use: Never used  Substance and Sexual Activity   Alcohol use: Yes    Alcohol/week: 21.0 standard drinks of alcohol    Types: 21 Shots of liquor per week    Comment: finishes 1/2L per week dark rum.- 3 shots of liquor a day   Drug use: No   Sexual activity: Not Currently    Partners: Female    Comment: Married

## 2023-01-08 NOTE — Telephone Encounter (Signed)
-----   Message from Alfonzo Beers sent at 01/08/2023  9:57 AM EDT ----- Regarding: post op visit FYI:  Mr. Andress has left 2 message regarding his incision draining "amber colored" fluid.  Patient is 12 days post op left carpal tunnel release with ganglion incision with Dr. August Saucer at [facility].  Patient was very concerned with the incision and the soreness and left message on my voice mail.  He did not realize until after he had called yesterday that we were closed for [facility] Day.  He states the area  is sore and has continuously been draining.  He has been changing out wash cloths to catch the drainage.  He is scheduled at 2pm this afternoon for a nurse visit and will be moved over to Dr. Ophelia Charter' schedule per [facility].

## 2023-01-08 NOTE — Telephone Encounter (Signed)
Patient has been scheduled to see Dr. Ophelia Charter today.

## 2023-01-09 ENCOUNTER — Encounter: Payer: Self-pay | Admitting: Orthopedic Surgery

## 2023-01-09 ENCOUNTER — Other Ambulatory Visit: Payer: Self-pay

## 2023-01-09 ENCOUNTER — Ambulatory Visit: Payer: Medicare Other | Admitting: Orthopedic Surgery

## 2023-01-09 ENCOUNTER — Ambulatory Visit (INDEPENDENT_AMBULATORY_CARE_PROVIDER_SITE_OTHER): Payer: Medicare Other | Admitting: Orthopedic Surgery

## 2023-01-09 DIAGNOSIS — Z9889 Other specified postprocedural states: Secondary | ICD-10-CM

## 2023-01-09 DIAGNOSIS — G5603 Carpal tunnel syndrome, bilateral upper limbs: Secondary | ICD-10-CM

## 2023-01-09 NOTE — Progress Notes (Signed)
Number  Post-Op Visit Note   Patient: [name]           Date of Birth: October 10, 1931           MRN: [id number] Visit Date: 01/09/2023 PCP: Nona Dell, NP   Assessment & Plan:  Chief Complaint:  Chief Complaint  Patient presents with   Left Wrist - Routine Post Op     12/27/2022 left hand carpal tunnel release and tenosynovectomy   Visit Diagnoses:  1. S/P carpal tunnel release   2. Carpal tunnel syndrome, bilateral     Plan: Link Snuffer presents 10 days out left hand carpal tunnel release with tenosynovectomy.  On exam there is slight swelling in the proximal aspect of the incision.  Along the wrist flexion crease there is a little bit of delayed healing of the incision but no fluctuance can be expressed.  No induration.  Numbness has improved.  Grip strength intact.  Plan at this time is to discontinue every other suture and come back Monday for suture removal.  He is on Augmentin which she will complete.  Aspiration is performed today under ultrasound guidance of that fluid collection and that is sent for culture and analysis.  Diminished activity with the left wrist is strongly recommended pending resolution of the swelling.  Follow-Up Instructions: No follow-ups on file.   Orders:  Orders Placed This Encounter  Procedures   Anaerobic and Aerobic Culture   US Guided Needle Placement - No Linked Charges   No orders of the defined types were placed in this encounter.   Imaging: No results found.  PMFS History: Patient Active Problem List   Diagnosis Date Noted   HCAP (healthcare-associated pneumonia) 08/16/2022   Chest pain 07/27/2022   CHF (congestive heart failure) (HCC) 07/24/2022   Hx pulmonary embolism 06/20/2022   Hypokalemia 06/20/2022   Abnormal EKG 06/19/2022   Fracture of twelfth thoracic vertebra (HCC) 01/03/2022   Opioid dependence (HCC) 01/03/2022   Poor balance 01/03/2022   Pedal edema 01/03/2022   Bilateral wrist pain 06/19/2021   Fall  01/11/2021   Orthostatic hypotension    Weakness    Osteoporosis 10/07/2020   Lumbar compression fracture (HCC) 09/07/2020   Greater trochanteric bursitis of both hips 05/04/2020   Left patella fracture 01/19/2020   Arthritis of left knee 05/07/2019   Pre-operative clearance 04/28/2019   Acute on chronic systolic CHF (congestive heart failure) (HCC) 08/10/2018   Leg wound, left 03/19/2018   Right arm pain 11/11/2017   Primary osteoarthritis of right foot 08/21/2017   Syncope 02/27/2017   Arthritis of right ankle 12/18/2016   Carpal tunnel syndrome, bilateral 09/04/2016   Leg swelling 08/16/2016   Hand weakness 07/12/2016   Degenerative arthritis of left knee 06/28/2016   Body mass index (bmi) 28.0-28.9, adult 06/18/2016   S/P cervical spinal fusion 06/14/2016   Iron deficiency anemia 03/16/2016   Arthritis of left acromioclavicular joint 01/03/2016   Rotator cuff disorder 01/03/2016   Osteoarthritis of left knee 04/30/2015   Baker's cyst of knee 04/30/2015   Esophageal stricture 04/26/2015   Benign prostatic hyperplasia 02/21/2015   Constipation 02/21/2015   Myalgia and myositis 11/18/2014   Status post lumbar spine operation 06/25/2014   Pseudoarthrosis of cervical spine (HCC) 05/25/2014   Sacral fracture, closed (HCC) 09/23/2013   Spondylolisthesis, congenital 09/02/2013   Polyarticular arthritis 07/19/2013   Spondylosis of lumbosacral spine at multiple levels without myelopathy 04/24/2013   Chronic radicular low back pain 12/15/2012   Gout  02/20/2010   ERECTILE DYSFUNCTION, ORGANIC 02/20/2010   Hyperlipidemia 10/18/2008   CAD (coronary artery disease) 08/18/2007   Essential hypertension 05/09/2007   ALLERGIC RHINITIS 03/27/2007   Past Medical History:  Diagnosis Date   ACTINIC KERATOSIS, HEAD 03/23/2008   ALLERGIC RHINITIS 03/27/2007   Arthritis    BENIGN PROSTATIC HYPERTROPHY, WITH URINARY OBSTRUCTION 10/16/2007   CAD 08/18/2007   "patient denies any issues with  heart, does not see cardiologist"   Carpal tunnel syndrome, bilateral 09/04/2016   CARPAL TUNNEL SYNDROME, LEFT 04/13/2008   CHF (congestive heart failure) (HCC)    Chronic back pain    Chronic neck pain    Complication of anesthesia    left lower jaw teeth have been injured in the past and he has lost those, now has 2 loose front teeth due to a mouth injury on 01/02/20   DVT (deep venous thrombosis) (HCC)    ERECTILE DYSFUNCTION, ORGANIC 02/20/2010   Gout, unspecified 02/20/2010   Headache(784.0)    migraines hx of- started at age 42- none since 64   History of melanoma    HYPERLIPIDEMIA, WITH HIGH HDL 10/18/2008   HYPERTENSION 05/09/2007   sees Dr. 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   Patellar tendinitis 10/10/2009   Sciatic nerve pain    on Oxycodone as needed   SKIN CANCER, HX OF 03/27/2007   UNS ADVRS EFF UNS RX MEDICINAL&BIOLOGICAL SBSTNC 08/18/2007   Urinary urgency     Family History  Problem Relation Age of Onset   Dementia Mother    Anemia Father    Heart disease Father    Stroke Sister        ICH   Lung disease Sister     Past Surgical History:  Procedure Laterality Date   APPENDECTOMY  1969   APPLICATION OF WOUND VAC Left 01/19/2020   Procedure: Application Of Wound Vac;  Surgeon: Cammy Copa, MD;  Location: [facility] OR;  Service: Orthopedics;  Laterality: Left;   BLEPHAROPLASTY     CARPAL TUNNEL RELEASE Right    CARPAL TUNNEL RELEASE Left 12/27/2022   Procedure: CARPAL TUNNEL RELEASE WITH GANGLION CYST EXCISION;  Surgeon: Cammy Copa, MD;  Location: [facility] OR;  Service: Orthopedics;  Laterality: Left;   CHOLECYSTECTOMY     ESOPHAGOGASTRODUODENOSCOPY (EGD) WITH PROPOFOL N/A 04/26/2015   Procedure: ESOPHAGOGASTRODUODENOSCOPY (EGD) WITH PROPOFOL;  Surgeon: Louis Meckel, MD;  Location: [facility] ENDOSCOPY;  Service: Endoscopy;   Laterality: N/A;   EYE SURGERY     bilateral cataracts removed and vitrectomies   JOINT REPLACEMENT Right    knee   KNEE ARTHROSCOPY     KYPHOPLASTY N/A 06/10/2020   Procedure: Thoracic Twelve, Lumbar One, Lumbar Two Kyphoplasty;  Surgeon: Coletta Memos, MD;  Location: [facility] OR;  Service: Neurosurgery;  Laterality: N/A;  3C/RM 21   KYPHOPLASTY N/A 09/07/2020   Procedure: Lumbar three KYPHOPLASTY;  Surgeon: Coletta Memos, MD;  Location: [facility] OR;  Service: Neurosurgery;  Laterality: N/A;   LAMINECTOMY     LATERAL FUSION LUMBAR SPINE, TRANSVERSE     LEFT HEART CATH AND CORONARY ANGIOGRAPHY N/A 07/27/2022   Procedure: LEFT HEART CATH AND CORONARY ANGIOGRAPHY;  Surgeon: Orpah Cobb, MD;  Location: [facility] INVASIVE CV LAB;  Service: Cardiovascular;  Laterality: N/A;   LUMBAR LAMINECTOMY/DECOMPRESSION MICRODISCECTOMY Left 11/03/2012   Procedure: LUMBAR LAMINECTOMY/DECOMPRESSION MICRODISCECTOMY 1 LEVEL;  Surgeon: Hewitt Shorts, MD;  Location: [facility] NEURO ORS;  Service: Neurosurgery;  Laterality: Left;  LEFT L5S1 laminotomy foraminotomy and possible microdiskectomy   MELANOMA EXCISION     scalp   NASAL SINUS SURGERY     ORIF PATELLA Left 01/19/2020   Procedure: LEFT OPEN REDUCTION INTERNAL (ORIF) FIXATION PATELLA WITH AUTO GRAFTING USING HAMSTRING REINFORCEMENT;  Surgeon: Cammy Copa, MD;  Location: [facility] OR;  Service: Orthopedics;  Laterality: Left;   POSTERIOR CERVICAL FUSION/FORAMINOTOMY Right 10/01/2012   Procedure: POSTERIOR CERVICAL FUSION/FORAMINOTOMY LEVEL 1;  Surgeon: Hewitt Shorts, MD;  Location: [facility] NEURO ORS;  Service: Neurosurgery;  Laterality: Right;  Right Cervical seven thoracic one Cervical laminectomy/foraminotomy with posterior cervical arthrodesis    POSTERIOR LUMBAR FUSION N/A 05/23/2013   Procedure: exploration of lumbar wound, explantation of left interbody implant.;  Surgeon: Hewitt Shorts, MD;  Location: [facility] OR;  Service: Neurosurgery;  Laterality: N/A;   RECTAL SURGERY      Fissure   TOTAL KNEE ARTHROPLASTY Left 05/07/2019   Procedure: LEFT TOTAL KNEE ARTHROPLASTY;  Surgeon: Cammy Copa, MD;  Location: [facility] OR;  Service: Orthopedics;  Laterality: Left;   VITRECTOMY     right and left 2013   Social History   Occupational History   Occupation: Retired  Tobacco Use   Smoking status: Former    Packs/day: 1.00    Years: 33.00    Additional pack years: 0.00    Total pack years: 33.00    Types: Cigarettes    Quit date: 09/29/1981    Years since quitting: 41.3   Smokeless tobacco: Never  Vaping Use   Vaping Use: Never used  Substance and Sexual Activity   Alcohol use: Yes    Alcohol/week: 21.0 standard drinks of alcohol    Types: 21 Shots of liquor per week    Comment: finishes 1/2L per week dark rum.- 3 shots of liquor a day   Drug use: No   Sexual activity: Not Currently    Partners: Female    Comment: Married

## 2023-01-11 ENCOUNTER — Encounter (HOSPITAL_BASED_OUTPATIENT_CLINIC_OR_DEPARTMENT_OTHER): Payer: Medicare Other | Attending: General Surgery | Admitting: General Surgery

## 2023-01-11 ENCOUNTER — Telehealth: Payer: Self-pay

## 2023-01-11 DIAGNOSIS — I872 Venous insufficiency (chronic) (peripheral): Secondary | ICD-10-CM | POA: Diagnosis not present

## 2023-01-11 DIAGNOSIS — L97322 Non-pressure chronic ulcer of left ankle with fat layer exposed: Secondary | ICD-10-CM | POA: Insufficient documentation

## 2023-01-11 DIAGNOSIS — I509 Heart failure, unspecified: Secondary | ICD-10-CM | POA: Insufficient documentation

## 2023-01-11 DIAGNOSIS — I2510 Atherosclerotic heart disease of native coronary artery without angina pectoris: Secondary | ICD-10-CM | POA: Diagnosis not present

## 2023-01-11 DIAGNOSIS — R600 Localized edema: Secondary | ICD-10-CM | POA: Insufficient documentation

## 2023-01-11 DIAGNOSIS — I110 Hypertensive heart disease with heart failure: Secondary | ICD-10-CM | POA: Insufficient documentation

## 2023-01-11 DIAGNOSIS — Z87891 Personal history of nicotine dependence: Secondary | ICD-10-CM | POA: Diagnosis not present

## 2023-01-11 NOTE — Telephone Encounter (Signed)
I called and spoke with [name]  Having a little bit of increased pain that he attributes to wearing his brace.  He will continue trying to wear the brace and sticking it out over the weekend.  No increased drainage.  No fevers or chills.  Incision looks okay and no significant change compared with how it looks couple days ago.  Coming in on Monday and we will recheck it then.

## 2023-01-11 NOTE — Telephone Encounter (Signed)
Patient called stating that he is having just a little pain that is radiating up his left arm.  Patient had left wrist, carpal tunnel surgery on 12/27/2022.  Would like a call back to discuss.  CB# [phone].  Please advise.  Thank you.

## 2023-01-12 NOTE — Progress Notes (Signed)
HARVEL, EISENMAN ([id number]) [phone] [id number] Page 1 of 4 Visit Report for 01/11/2023 Abuse Risk Screen Details Patient Name: Date of Service: [name] 01/11/2023 9:00 A M Medical Record Number: [id number] Patient Account Number: [id number] Date of Birth/Sex: Treating RN: 04/28/1932 (89 y.o. Damaris Schooner Primary Care Merwyn Hodapp: Alcide Clever Other Clinician: Referring Keaundre Thelin: Treating Hersey Maclellan/Extender: Lina Sar in Treatment: 0 Abuse Risk Screen Items Answer ABUSE RISK SCREEN: Has anyone close to you tried to hurt or harm you recentlyo No Do you feel uncomfortable with anyone in your familyo No Has anyone forced you do things that you didnt want to doo No Electronic Signature(s) Signed: 01/11/2023 4:59:42 PM By: Zenaida Deed RN, BSN Entered By: Zenaida Deed on 01/11/2023 09:43:12 -------------------------------------------------------------------------------- Activities of Daily Living Details Patient Name: Date of Service: [name], [name] 01/11/2023 9:00 A M Medical Record Number: [id number] Patient Account Number: [id number] Date of Birth/Sex: Treating RN: 1932/08/10 (89 y.o. Damaris Schooner Primary Care Keegen Heffern: Alcide Clever Other Clinician: Referring Glenmore Karl: Treating Wilberta Dorvil/Extender: Lina Sar in Treatment: 0 Activities of Daily Living Items Answer Activities of Daily Living (Please select one for each item) Drive Automobile Completely Able T Medications ake Completely Able Use T elephone Completely Able Care for Appearance Completely Able Use T oilet Completely Able Bath / Shower Completely Able Dress Self Completely Able Feed Self Completely Able Walk Need Assistance Get In / Out Bed Completely Able Housework Completely Able Prepare Meals Completely Able Handle Money Completely Able Shop for Self Completely Able Electronic  Signature(s) Signed: 01/11/2023 4:59:42 PM By: Zenaida Deed RN, BSN Entered By: Zenaida Deed on 01/11/2023 09:43:44 -------------------------------------------------------------------------------- Education Screening Details Patient Name: Date of Service: [name], [name] 01/11/2023 9:00 A M Medical Record Number: [id number] Patient Account Number: [id number] Date of Birth/Sex: Treating RN: Jul 02, 1932 (89 y.o. Damaris Schooner Primary Care Maicie Vanderloop: Alcide Clever Other Clinician: Referring Luvenia Cranford: Treating Mariangel Ringley/Extender: Lina Sar in Treatment: [address] GIANLUCCA, LEHRMAN ([id number]) [id number] Page 2 of 4 Primary Learner Assessed: Patient Learning Preferences/Education Level/Primary Language Learning Preference: Explanation, Demonstration, Printed Material Highest Education Level: High School Preferred Language: Economist Language Barrier: No Translator Needed: No Memory Deficit: No Emotional Barrier: No Cultural/Religious Beliefs Affecting Medical Care: No Physical Barrier Impaired Vision: No Impaired Hearing: No Decreased Hand dexterity: No Knowledge/Comprehension Knowledge Level: High Comprehension Level: High Ability to understand written instructions: High Ability to understand verbal instructions: High Motivation Anxiety Level: Calm Cooperation: Cooperative Education Importance: Acknowledges Need Interest in Health Problems: Asks Questions Perception: Coherent Willingness to Engage in Self-Management High Activities: Readiness to Engage in Self-Management High Activities: Electronic Signature(s) Signed: 01/11/2023 4:59:42 PM By: Zenaida Deed RN, BSN Entered By: Zenaida Deed on 01/11/2023 09:44:15 -------------------------------------------------------------------------------- Fall Risk Assessment Details Patient Name: Date of Service: [name], [name]  01/11/2023 9:00 A M Medical Record Number: [id number] Patient Account Number: [id number] Date of Birth/Sex: Treating RN: March 10, 1932 (89 y.o. Damaris Schooner Primary Care Herby Amick: Alcide Clever Other Clinician: Referring Tyyne Cliett: Treating Martita Brumm/Extender: Lina Sar in Treatment: 0 Fall Risk Assessment Items Have you had 2 or more falls in the last 12 monthso 0 No Have you had any fall that resulted in injury in the last 12 monthso 0 No FALLS RISK SCREEN History of falling - immediate or within 3 months 0 No Secondary diagnosis (Do you have 2 or more medical diagnoseso) 0 No Ambulatory aid None/bed rest/wheelchair/nurse 0  No Crutches/cane/walker 15 Yes Furniture 0 No Intravenous therapy Access/Saline/Heparin Lock 0 No Gait/Transferring Normal/ bed rest/ wheelchair 0 Yes Weak (short steps with or without shuffle, stooped but able to lift head while walking, may seek 0 No support from furniture) Impaired (short steps with shuffle, may have difficulty arising from chair, head down, impaired 0 No balance) Mental Status Oriented to own ability 0 Yes Overestimates or forgets limitations 0 No Risk Level: Low Risk Score: 15 [name], [name] ([id number]) [id number] Page 3 of 4 Electronic Signature(s) -------------------------------------------------------------------------------- Foot Assessment Details Patient Name: Date of Service: [name] 01/11/2023 9:00 A M Medical Record Number: [id number] Patient Account Number: [id number] Date of Birth/Sex: Treating RN: 1932/04/05 (89 y.o. Damaris Schooner Primary Care Raychell Holcomb: Alcide Clever Other Clinician: Referring Modelle Vollmer: Treating Jamacia Jester/Extender: Lina Sar in Treatment: 0 Foot Assessment Items Site Locations + = Sensation present, - = Sensation absent, C = Callus, U = Ulcer R = Redness, W = Warmth, M =  Maceration, PU = Pre-ulcerative lesion F = Fissure, S = Swelling, D = Dryness Assessment Right: Left: Other Deformity: No No Prior Foot Ulcer: No No Prior Amputation: No No Charcot Joint: No No Ambulatory Status: Ambulatory With Help Assistance Device: Walker Gait: Steady Electronic Signature(s) Signed: 01/11/2023 4:59:42 PM By: Zenaida Deed RN, BSN Entered By: Zenaida Deed on 01/11/2023 09:46:58 -------------------------------------------------------------------------------- Nutrition Risk Screening Details Patient Name: Date of Service: [name], [name] 01/11/2023 9:00 A M Medical Record Number: [id number] Patient Account Number: [id number] Date of Birth/Sex: Treating RN: [birth year] (89 y.o. Damaris Schooner Primary Care [name]: Alcide Clever Other Clinician: Referring Emmogene Simson: Treating Steaven Wholey/Extender: Lina Sar in Treatment: 0 Height (in): 72 Weight (lbs): 198 Body Mass Index (BMI): 26.9 [name], [name] ([id number]) [id number] Page 4 of 4 Nutrition Risk Screening Items Score Screening NUTRITION RISK SCREEN: I have an illness or condition that made me change the kind and/or amount of food I eat 0 No I eat fewer than two meals per day 0 No I eat few fruits and vegetables, or milk products 0 No I have three or more drinks of beer, liquor or wine almost every day 0 No I have tooth or mouth problems that make it hard for me to eat 0 No I don't always have enough money to buy the food I need 0 No I eat alone most of the time 0 No I take three or more different prescribed or over-the-counter drugs a day 1 Yes Without wanting to, I have lost or gained 10 pounds in the last six months 0 No I am not always physically able to shop, cook and/or feed myself 0 No Nutrition Protocols Good Risk Protocol 0 No interventions needed Moderate Risk Protocol High Risk Proctocol Risk Level: Good  Risk Score: 1 Electronic Signature(s) Signed: 01/11/2023 4:59:42 PM By: Zenaida Deed RN, BSN Entered By: Zenaida Deed on 01/11/2023 09:45:06

## 2023-01-13 DIAGNOSIS — M659 Synovitis and tenosynovitis, unspecified: Secondary | ICD-10-CM

## 2023-01-14 ENCOUNTER — Ambulatory Visit: Payer: Medicare Other | Admitting: Surgical

## 2023-01-14 ENCOUNTER — Encounter: Payer: Self-pay | Admitting: Gastroenterology

## 2023-01-14 DIAGNOSIS — Z9889 Other specified postprocedural states: Secondary | ICD-10-CM

## 2023-01-16 ENCOUNTER — Encounter: Payer: Medicare Other | Admitting: Surgical

## 2023-01-16 LAB — ANAEROBIC AND AEROBIC CULTURE
AER RESULT:: NO GROWTH
MICRO NUMBER:: 15020470
MICRO NUMBER:: 15020471
SPECIMEN QUALITY:: ADEQUATE
SPECIMEN QUALITY:: ADEQUATE

## 2023-01-19 ENCOUNTER — Encounter: Payer: Self-pay | Admitting: Surgical

## 2023-01-19 ENCOUNTER — Other Ambulatory Visit: Payer: Medicare Other

## 2023-01-19 NOTE — Progress Notes (Signed)
Post-Op Visit Note   Patient: [name]           Date of Birth: September 21, 1931           MRN: [id number] Visit Date: 01/14/2023 PCP: Nona Dell, NP   Assessment & Plan:  Chief Complaint:  Chief Complaint  Patient presents with   Left Wrist - Routine Post Op   Visit Diagnoses:  1. S/P carpal tunnel release     Plan: Patient is a 89 year old male who presents for evaluation of left hand s/p left carpal tunnel release with debridement of tenosynovitis.  Surgery was on 12/27/2022.  He states that he is doing overall pretty good.  Does not really have much pain now.  No weakness or difficulty using his hand as long as he does not have the brace on.  The brace is causing him the majority of his dysfunction and discomfort according to him.  He has no more drainage.  No fevers or chills.  Only has tingling in the tips of the index and thumb but otherwise symptoms continue to be vastly improved compared with prior to surgery.  Feels more functional with his hand.  On exam, patient has intact EPL, FPL, finger abduction, thumb abduction, 2+ radial pulse.  Incision looks to be healing well with still little bit of superficial dehiscence in the midportion of the incision near the wrist crease.  There is no expressible drainage.  No significant evidence of cellulitis.  The rest of the sutures were removed and there is no progressive dehiscence of the incision.  Will plan to see him back in 9 days for clinical recheck on his incision.  Follow-Up Instructions: No follow-ups on file.   Orders:  No orders of the defined types were placed in this encounter.  No orders of the defined types were placed in this encounter.   Imaging: No results found.  PMFS History: Patient Active Problem List   Diagnosis Date Noted   Flexor carpi radialis tenosynovitis 01/13/2023   HCAP (healthcare-associated pneumonia) 08/16/2022   Chest pain 07/27/2022   CHF (congestive heart failure) (HCC) 07/24/2022    Hx pulmonary embolism 06/20/2022   Hypokalemia 06/20/2022   Abnormal EKG 06/19/2022   Fracture of twelfth thoracic vertebra (HCC) 01/03/2022   Opioid dependence (HCC) 01/03/2022   Poor balance 01/03/2022   Pedal edema 01/03/2022   Bilateral wrist pain 06/19/2021   Fall 01/11/2021   Orthostatic hypotension    Weakness    Osteoporosis 10/07/2020   Lumbar compression fracture (HCC) 09/07/2020   Greater trochanteric bursitis of both hips 05/04/2020   Left patella fracture 01/19/2020   Arthritis of left knee 05/07/2019   Pre-operative clearance 04/28/2019   Acute on chronic systolic CHF (congestive heart failure) (HCC) 08/10/2018   Leg wound, left 03/19/2018   Right arm pain 11/11/2017   Primary osteoarthritis of right foot 08/21/2017   Syncope 02/27/2017   Arthritis of right ankle 12/18/2016   Carpal tunnel syndrome, left upper limb 09/04/2016   Leg swelling 08/16/2016   Hand weakness 07/12/2016   Degenerative arthritis of left knee 06/28/2016   Body mass index (bmi) 28.0-28.9, adult 06/18/2016   S/P cervical spinal fusion 06/14/2016   Iron deficiency anemia 03/16/2016   Arthritis of left acromioclavicular joint 01/03/2016   Rotator cuff disorder 01/03/2016   Osteoarthritis of left knee 04/30/2015   Baker's cyst of knee 04/30/2015   Esophageal stricture 04/26/2015   Benign prostatic hyperplasia 02/21/2015   Constipation 02/21/2015  Myalgia and myositis 11/18/2014   Status post lumbar spine operation 06/25/2014   Pseudoarthrosis of cervical spine (HCC) 05/25/2014   Sacral fracture, closed (HCC) 09/23/2013   Spondylolisthesis, congenital 09/02/2013   Polyarticular arthritis 07/19/2013   Spondylosis of lumbosacral spine at multiple levels without myelopathy 04/24/2013   Chronic radicular low back pain 12/15/2012   Gout 02/20/2010   ERECTILE DYSFUNCTION, ORGANIC 02/20/2010   Hyperlipidemia 10/18/2008   CAD (coronary artery disease) 08/18/2007   Essential hypertension  05/09/2007   ALLERGIC RHINITIS 03/27/2007   Past Medical History:  Diagnosis Date   ACTINIC KERATOSIS, HEAD 03/23/2008   ALLERGIC RHINITIS 03/27/2007   Arthritis    BENIGN PROSTATIC HYPERTROPHY, WITH URINARY OBSTRUCTION 10/16/2007   CAD 08/18/2007   "patient denies any issues with heart, does not see cardiologist"   Carpal tunnel syndrome, bilateral 09/04/2016   CARPAL TUNNEL SYNDROME, LEFT 04/13/2008   CHF (congestive heart failure) (HCC)    Chronic back pain    Chronic neck pain    Complication of anesthesia    left lower jaw teeth have been injured in the past and he has lost those, now has 2 loose front teeth due to a mouth injury on 01/02/20   DVT (deep venous thrombosis) (HCC)    ERECTILE DYSFUNCTION, ORGANIC 02/20/2010   Gout, unspecified 02/20/2010   Headache(784.0)    migraines hx of- started at age 89- none since 48   History of melanoma    HYPERLIPIDEMIA, WITH HIGH HDL 10/18/2008   HYPERTENSION 05/09/2007   sees Dr. 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   Patellar tendinitis 10/10/2009   Sciatic nerve pain    on Oxycodone as needed   SKIN CANCER, HX OF 03/27/2007   UNS ADVRS EFF UNS RX MEDICINAL&BIOLOGICAL SBSTNC 08/18/2007   Urinary urgency     Family History  Problem Relation Age of Onset   Dementia Mother    Anemia Father    Heart disease Father    Stroke Sister        ICH   Lung disease Sister     Past Surgical History:  Procedure Laterality Date   APPENDECTOMY  1969   APPLICATION OF WOUND VAC Left 01/19/2020   Procedure: Application Of Wound Vac;  Surgeon: Cammy Copa, MD;  Location: [facility] OR;  Service: Orthopedics;  Laterality: Left;   BLEPHAROPLASTY     CARPAL TUNNEL RELEASE Right    CARPAL TUNNEL RELEASE Left 12/27/2022   Procedure: CARPAL TUNNEL RELEASE WITH GANGLION CYST EXCISION;  Surgeon: Cammy Copa, MD;   Location: [facility] OR;  Service: Orthopedics;  Laterality: Left;   CHOLECYSTECTOMY     ESOPHAGOGASTRODUODENOSCOPY (EGD) WITH PROPOFOL N/A 04/26/2015   Procedure: ESOPHAGOGASTRODUODENOSCOPY (EGD) WITH PROPOFOL;  Surgeon: Louis Meckel, MD;  Location: [facility] END[facility]COPY;  Service: Endoscopy;  Laterality: N/A;   EYE SURGERY     bilateral cataracts removed and vitrectomies   JOINT REPLACEMENT Right    knee   KNEE ARTHROSCOPY     KYPHOPLASTY N/A 06/10/2020   Procedure: Thoracic Twelve, Lumbar One, Lumbar Two Kyphoplasty;  Surgeon: Coletta Memos, MD;  Location: [facility] OR;  Service: Neurosurgery;  Laterality: N/A;  3C/RM 21   KYPHOPLASTY N/A 09/07/2020   Procedure: Lumbar three KYPHOPLASTY;  Surgeon: Coletta Memos, MD;  Location: [facility] OR;  Service: Neurosurgery;  Laterality: N/A;   LAMINECTOMY  LATERAL FUSION LUMBAR SPINE, TRANSVERSE     LEFT HEART CATH AND CORONARY ANGIOGRAPHY N/A 07/27/2022   Procedure: LEFT HEART CATH AND CORONARY ANGIOGRAPHY;  Surgeon: Orpah Cobb, MD;  Location: [facility];  Service: Cardiovascular;  Laterality: N/A;   LUMBAR LAMINECTOMY/DECOMPRESSION MICRODISCECTOMY Left 11/03/2012   Procedure: LUMBAR LAMINECTOMY/DECOMPRESSION MICRODISCECTOMY 1 LEVEL;  Surgeon: Hewitt Shorts, MD;  Location: [facility]UR[facility] OR[facility];  Service: Neurosurgery;  Laterality: Left;  LEFT L5S1 laminotomy foraminotomy and possible microdiskectomy   MELANOMA EXCISION     scalp   NASAL SINUS SURGERY     ORIF PATELLA Left 01/19/2020   Procedure: LEFT OPEN REDUCTION INTERNAL (ORIF) FIXATION PATELLA WITH AUTO GRAFTING USING HAMSTRING REINFORCEMENT;  Surgeon: Cammy Copa, MD;  Location: [facility] OR;  Service: Orthopedics;  Laterality: Left;   POSTERIOR CERVICAL FUSION/FORAMINOTOMY Right 10/01/2012   Procedure: POSTERIOR CERVICAL FUSION/FORAMINOTOMY LEVEL 1;  Surgeon: Hewitt Shorts, MD;  Location: [facility]UR[facility];  Service: Neurosurgery;  Laterality: Right;  Right Cervical seven thoracic one Cervical  laminectomy/foraminotomy with posterior cervical arthrodesis    POSTERIOR LUMBAR FUSION N/A 05/23/2013   Procedure: exploration of lumbar wound, explantation of left interbody implant.;  Surgeon: Hewitt Shorts, MD;  Location: [facility] OR;  Service: Neurosurgery;  Laterality: N/A;   RECTAL SURGERY     Fissure   TOTAL KNEE ARTHROPLASTY Left 05/07/2019   Procedure: LEFT TOTAL KNEE ARTHROPLASTY;  Surgeon: Cammy Copa, MD;  Location: [facility] OR;  Service: Orthopedics;  Laterality: Left;   VITRECTOMY     right and left 2013   Social History   Occupational History   Occupation: Retired  Tobacco Use   Smoking status: Former    Packs/day: 1.00    Years: 33.00    Additional pack years: 0.00    Total pack years: 33.00    Types: Cigarettes    Quit date: 09/29/1981    Years since quitting: 41.3   Smokeless tobacco: Never  Vaping Use   Vaping Use: Never used  Substance and Sexual Activity   Alcohol use: Yes    Alcohol/week: 21.0 standard drinks of alcohol    Types: 21 Shots of liquor per week    Comment: finishes 1/2L per week dark rum.- 3 shots of liquor a day   Drug use: No   Sexual activity: Not Currently    Partners: Female    Comment: Married

## 2023-01-21 ENCOUNTER — Encounter (HOSPITAL_BASED_OUTPATIENT_CLINIC_OR_DEPARTMENT_OTHER): Payer: Medicare Other | Attending: General Surgery | Admitting: General Surgery

## 2023-01-21 DIAGNOSIS — I2510 Atherosclerotic heart disease of native coronary artery without angina pectoris: Secondary | ICD-10-CM | POA: Insufficient documentation

## 2023-01-21 DIAGNOSIS — I110 Hypertensive heart disease with heart failure: Secondary | ICD-10-CM | POA: Insufficient documentation

## 2023-01-21 DIAGNOSIS — M1990 Unspecified osteoarthritis, unspecified site: Secondary | ICD-10-CM | POA: Diagnosis not present

## 2023-01-21 DIAGNOSIS — L97322 Non-pressure chronic ulcer of left ankle with fat layer exposed: Secondary | ICD-10-CM | POA: Insufficient documentation

## 2023-01-21 DIAGNOSIS — I509 Heart failure, unspecified: Secondary | ICD-10-CM | POA: Insufficient documentation

## 2023-01-21 DIAGNOSIS — L84 Corns and callosities: Secondary | ICD-10-CM | POA: Insufficient documentation

## 2023-01-21 DIAGNOSIS — I872 Venous insufficiency (chronic) (peripheral): Secondary | ICD-10-CM | POA: Insufficient documentation

## 2023-01-21 DIAGNOSIS — G629 Polyneuropathy, unspecified: Secondary | ICD-10-CM | POA: Insufficient documentation

## 2023-01-22 NOTE — Progress Notes (Signed)
[name] ([id number]) [id number] Page 1 of 8 Visit Report for 01/21/2023 Chief Complaint Document Details Patient Name: Date of Service: [place] 01/21/2023 2:45 PM Medical Record Number: [id number] Patient Account Number: [id number] Date of Birth/Sex: Treating RN: [birth year] (89 y.o. M) Primary Care Provider: Alcide Clever Other Clinician: Referring Provider: Treating Provider/Extender: Duanne Guess [facility], MICHELLE Weeks in Treatment: 1 Information Obtained from: Patient Chief Complaint Patient presents for treatment of an open ulcer due to venous insufficiency Electronic Signature(s) Signed: 01/21/2023 3:38:38 PM By: Duanne Guess MD FACS Entered By: Duanne Guess on 01/21/2023 15:38:38 -------------------------------------------------------------------------------- Debridement Details Patient Name: Date of Service: [name], [name] 01/21/2023 2:45 PM Medical Record Number: [id number] Patient Account Number: [id number] Date of Birth/Sex: Treating RN: 1932/05/13 (89 y.o. [name] Primary Care Provider: Alcide Clever Other Clinician: Referring Provider: Treating Provider/Extender: Duanne Guess [facility], MICHELLE Weeks in Treatment: 1 Debridement Performed for Assessment: Wound #1 Left,Medial Lower Leg Performed By: Physician Duanne Guess, MD Debridement Type: Debridement Severity of Tissue Pre Debridement: Fat layer exposed Level of Consciousness (Pre-procedure): Awake and Alert Pre-procedure Verification/Time Out Yes - 15:25 Taken: Start Time: 15:26 Pain Control: Lidocaine 4% T opical Solution Percent of Wound Bed Debrided: 100% T Area Debrided (cm): otal 2.9 Tissue and other material debrided: Non-Viable, Slough, Slough Level: Non-Viable Tissue Debridement Description: Selective/Open Wound Instrument: Curette Bleeding: Minimum Hemostasis Achieved: Pressure Procedural Pain: 0 Post  Procedural Pain: 0 Response to Treatment: Procedure was tolerated well Level of Consciousness (Post- Awake and Alert procedure): Post Debridement Measurements of Total Wound Length: (cm) 3.7 Width: (cm) 1 Depth: (cm) 0.1 Volume: (cm) 0.291 Character of Wound/Ulcer Post Debridement: Improved Severity of Tissue Post Debridement: Fat layer exposed Post Procedure Diagnosis Same as Pre-procedure Notes scribed for Dr. Lady Gary by Zenaida Deed, RN Electronic Signature(s) [name] ([id number]) [phone].pdf Page 2 of 8 Signed: 01/21/2023 3:53:23 PM By: Duanne Guess MD FACS Signed: 01/21/2023 5:26:43 PM By: Zenaida Deed RN, BSN Entered By: Zenaida Deed on 01/21/2023 15:29:17 -------------------------------------------------------------------------------- HPI Details Patient Name: Date of Service: [name], [name] 01/21/2023 2:45 PM Medical Record Number: [id number] Patient Account Number: [id number] Date of Birth/Sex: Treating RN: [birth year] (89 y.o. M) Primary Care Provider: Alcide Clever Other Clinician: Referring Provider: Treating Provider/Extender: Duanne Guess [facility], MICHELLE Weeks in Treatment: 1 History of Present Illness HPI Description: ADMISSION 01/11/2023 This is an 89 year old nondiabetic with a history of chronic venous insufficiency, coronary artery disease, congestive heart failure, and lower extremity edema. He has historically been followed by podiatry for various ulcers and calluses on his feet. When he was seen in the middle of April, he was noted to have an ulcer starting on his left ankle. He was prescribed a course of cephalexin and advised to apply Betadine to the wound with an Ace wrap. He was referred to the wound care center for further evaluation and management. Apparently the wound closed of its own accord prior to his appointment with [place], but subsequently reopened. The notes from podiatry do indicate that  compression and elevation were discussed as means to help heal ulceration and avoid future episodes. After the wound reopened, he was seen again and prescribed a course of Augmentin. He is now here for his wound care appointment. 01/21/2023: The wound is smaller and more superficial. It is fairly clean with just a little bit of light slough on the surface. His venous reflux studies are scheduled for this coming Thursday. Electronic Signature(s) Signed: 01/21/2023 3:39:16 PM By: Lady Gary,  Victorino Dike MD FACS Entered By: Duanne Guess on 01/21/2023 15:39:16 -------------------------------------------------------------------------------- Physical Exam Details Patient Name: Date of Service: [name] [place] 01/21/2023 2:45 PM Medical Record Number: [id number] Patient Account Number: [id number] Date of Birth/Sex: Treating RN: [birth year] (89 y.o. M) Primary Care Provider: Alcide Clever Other Clinician: Referring Provider: Treating Provider/Extender: Duanne Guess [facility], MICHELLE Weeks in Treatment: 1 Constitutional Hypotensive, but normal for this patient. . . . no acute distress. Respiratory Normal work of breathing on room air. Notes 01/21/2023: The wound is smaller and more superficial. It is fairly clean with just a little bit of light slough on the surface. Electronic Signature(s) Signed: 01/21/2023 3:39:49 PM By: Duanne Guess MD FACS Entered By: Duanne Guess on 01/21/2023 15:39:49 -------------------------------------------------------------------------------- Physician Orders Details Patient Name: Date of Service: [name], [name] 01/21/2023 2:45 PM Medical Record Number: [id number] Patient Account Number: [id number] Date of Birth/Sex: Treating RN: [birth year] (89 y.o. [name] Primary Care Provider: Alcide Clever Other Clinician: Referring Provider: Treating Provider/Extender: Duanne Guess [facility], MICHELLE Weeks in Treatment: 1 Verbal / Phone  Orders: No Diagnosis Coding [name] ([id number]) [id number] Page 3 of 8 ICD-10 Coding Code Description [phone] Non-pressure chronic ulcer of left ankle with fat layer exposed I87.2 Venous insufficiency (chronic) (peripheral) I50.9 Heart failure, unspecified R60.0 Localized edema I25.10 Atherosclerotic heart disease of native coronary artery without angina pectoris Follow-up Appointments ppointment in 1 week. - Dr. Lady Gary RM 2 Return A Tuesday 6/18 @ 10:00 am Anesthetic Wound #1 Left,Medial Lower Leg (In clinic) Topical Lidocaine 4% applied to wound bed Bathing/ Shower/ Hygiene May shower with protection but do not get wound dressing(s) wet. Protect dressing(s) with water repellant cover (for example, large plastic bag) or a cast cover and may then take shower. Edema Control - Lymphedema / SCD / Other Elevate legs to the level of the heart or above for 30 minutes daily and/or when sitting for 3-4 times a day throughout the day. - throughout the day Avoid standing for long periods of time. Exercise regularly Compression stocking or Garment 20-30 mm/Hg pressure to: - right leg daily Wound Treatment Wound #1 - Lower Leg Wound Laterality: Left, Medial Peri-Wound Care: Sween Lotion (Moisturizing lotion) 1 x Per Week Discharge Instructions: Apply moisturizing lotion as directed Prim Dressing: Maxorb Extra Ag+ Alginate Dressing, 2x2 (in/in) 1 x Per Week ary Discharge Instructions: Apply to wound bed as instructed Secondary Dressing: Woven Gauze Sponge, Non-Sterile 4x4 in 1 x Per Week Discharge Instructions: Apply over primary dressing as directed. Compression Wrap: Urgo K2 Lite, (equivalent to a 3 layer) two layer compression system, regular 1 x Per Week Discharge Instructions: Apply Urgo K2 Lite as directed (alternative to 3 layer compression). Electronic Signature(s) Signed: 01/21/2023 3:53:23 PM By: Duanne Guess MD FACS Entered By: Duanne Guess on 01/21/2023 15:40:00 -------------------------------------------------------------------------------- Problem List Details Patient Name: Date of Service: [name], Coy [name]. 01/21/2023 2:45 PM Medical Record Number: [id number] Patient Account Number: [id number] Date of Birth/Sex: Treating RN: [birth year] (89 y.o. [name] Primary Care Provider: Alcide Clever Other Clinician: Referring Provider: Treating Provider/Extender: Duanne Guess [facility], MICHELLE Weeks in Treatment: 1 Active Problems ICD-10 Encounter Code Description Active Date MDM Diagnosis L97.322 Non-pressure chronic ulcer of left ankle with fat layer exposed 01/11/2023 No Yes I87.2 Venous insufficiency (chronic) (peripheral) 01/11/2023 No Yes I50.9 Heart failure, unspecified 01/11/2023 No Yes [name], [name] ([id number]) [id number] Page 4 of 8 R60.0 Localized edema 01/11/2023 No Yes I25.10 Atherosclerotic heart disease of native coronary artery without  angina pectoris 01/11/2023 No Yes Inactive Problems Resolved Problems Electronic Signature(s) Signed: 01/21/2023 3:36:35 PM By: Duanne Guess MD FACS Entered By: Duanne Guess on 01/21/2023 15:36:35 -------------------------------------------------------------------------------- Progress Note Details Patient Name: Date of Service: [name], [name] 01/21/2023 2:45 PM Medical Record Number: [id number] Patient Account Number: [id number] Date of Birth/Sex: Treating RN: February 11, 1932 (89 y.o. M) Primary Care Provider: Alcide Clever Other Clinician: Referring Provider: Treating Provider/Extender: Duanne Guess [facility], MICHELLE Weeks in Treatment: 1 Subjective Chief Complaint Information obtained from Patient Patient presents for treatment of an open ulcer due to venous insufficiency History of Present Illness (HPI) ADMISSION 01/11/2023 This is an 89 year old nondiabetic with a history of chronic venous  insufficiency, coronary artery disease, congestive heart failure, and lower extremity edema. He has historically been followed by podiatry for various ulcers and calluses on his feet. When he was seen in the middle of April, he was noted to have an ulcer starting on his left ankle. He was prescribed a course of cephalexin and advised to apply Betadine to the wound with an Ace wrap. He was referred to the wound care center for further evaluation and management. Apparently the wound closed of its own accord prior to his appointment with [place], but subsequently reopened. The notes from podiatry do indicate that compression and elevation were discussed as means to help heal ulceration and avoid future episodes. After the wound reopened, he was seen again and prescribed a course of Augmentin. He is now here for his wound care appointment. 01/21/2023: The wound is smaller and more superficial. It is fairly clean with just a little bit of light slough on the surface. His venous reflux studies are scheduled for this coming Thursday. Patient History Information obtained from Patient, Chart. Family History Lung Disease - Siblings, Stroke - Siblings, No family history of Cancer, Diabetes, Heart Disease, Hereditary Spherocytosis, Hypertension, Kidney Disease, Seizures, Thyroid Problems, Tuberculosis. Social History Former smoker, Marital Status - Married, Alcohol Use - Moderate, Drug Use - No History, Caffeine Use - Rarely. Medical History Eyes Patient has history of Cataracts - bil extractions Denies history of Glaucoma, Optic Neuritis Ear/Nose/Mouth/Throat Denies history of Chronic sinus problems/congestion, Middle ear problems Cardiovascular Patient has history of Congestive Heart Failure, Coronary Artery Disease, Hypertension, Peripheral Venous Disease Endocrine Denies history of Type I Diabetes, Type II Diabetes Integumentary (Skin) Denies history of History of Burn Musculoskeletal Patient has  history of Osteoarthritis Neurologic Patient has history of Neuropathy Oncologic Denies history of Received Chemotherapy, Received Radiation Psychiatric Denies history of Baruch Gouty, Confinement Anxiety [name], [name] ([id number]) [id number] Page 5 of 8 Hospitalization/Surgery History - bil carpal tunnel release. - cardiac cath. - kyphoplasty. - ORIF patella left. - left total knee arthroplasty. - posterior lumbar fusion. - lumbar laminectomy. - posterior cervical fusion. - appendectomy. - blepharoplasty. - cholecystectomy. - bil catract extraction. - right knee replacement. - melanoma excision. - rectal surgery fissure. - vitrectomy. Medical A Surgical History Notes nd Ear/Nose/Mouth/Throat seasonal allergies Musculoskeletal myositis, myalgia, left patella fracture, bursitis of hips, lumbar compression fx, spondylolistthesis Neurologic bil carpal tunnel Oncologic skin cancers Objective Constitutional Hypotensive, but normal for this patient. no acute distress. Vitals Time Taken: 2:46 AM, Height: 72 in, Weight: 198 lbs, BMI: 26.9, Temperature: 97.9 F, Pulse: 71 bpm, Respiratory Rate: 20 breaths/min, Blood Pressure: 81/51 mmHg. Respiratory Normal work of breathing on room air. General Notes: 01/21/2023: The wound is smaller and more superficial. It is fairly clean with just a little bit of light slough on the surface. Integumentary (Hair,  Skin) Wound #1 status is Open. Original cause of wound was Gradually Appeared. The date acquired was: 05/12/2022. The wound has been in treatment 1 weeks. The wound is located on the Left,Medial Lower Leg. The wound measures 3.7cm length x 1cm width x 0.1cm depth; 2.906cm^2 area and 0.291cm^3 volume. There is Fat Layer (Subcutaneous Tissue) exposed. There is no tunneling or undermining noted. There is a medium amount of serous drainage noted. The wound margin is flat and intact. There is large (67-100%) pink  granulation within the wound bed. There is a small (1-33%) amount of necrotic tissue within the wound bed including Adherent Slough. The periwound skin appearance had no abnormalities noted for texture. The periwound skin appearance had no abnormalities noted for moisture. The periwound skin appearance exhibited: Hemosiderin Staining. Periwound temperature was noted as No Abnormality. The periwound has tenderness on palpation. Assessment Active Problems ICD-10 Non-pressure chronic ulcer of left ankle with fat layer exposed Venous insufficiency (chronic) (peripheral) Heart failure, unspecified Localized edema Atherosclerotic heart disease of native coronary artery without angina pectoris Procedures Wound #1 Pre-procedure diagnosis of Wound #1 is a Venous Leg Ulcer located on the Left,Medial Lower Leg .Severity of Tissue Pre Debridement is: Fat layer exposed. There was a Selective/Open Wound Non-Viable Tissue Debridement with a total area of 2.9 sq cm performed by Duanne Guess, MD. With the following instrument(s): Curette to remove Non-Viable tissue/material. Material removed includes [facility] after achieving pain control using Lidocaine 4% Topical Solution. No specimens were taken. A time out was conducted at 15:25, prior to the start of the procedure. A Minimum amount of bleeding was controlled with Pressure. The procedure was tolerated well with a pain level of 0 throughout and a pain level of 0 following the procedure. Post Debridement Measurements: 3.7cm length x 1cm width x 0.1cm depth; 0.291cm^3 volume. Character of Wound/Ulcer Post Debridement is improved. Severity of Tissue Post Debridement is: Fat layer exposed. Post procedure Diagnosis Wound #1: Same as Pre-Procedure General Notes: scribed for Dr. Lady Gary by Zenaida Deed, RN. Pre-procedure diagnosis of Wound #1 is a Venous Leg Ulcer located on the Left,Medial Lower Leg . There was a Double Layer Compression Therapy Procedure by  Zenaida Deed, RN. Post procedure Diagnosis Wound #1: Same as Pre-Procedure Notes: urgo lite. [name], [name] ([id number]) [id number] Page 6 of 8 Plan Follow-up Appointments: Return Appointment in 1 week. - Dr. Lady Gary RM 2 Tuesday 6/18 @ 10:00 am Anesthetic: Wound #1 Left,Medial Lower Leg: (In clinic) Topical Lidocaine 4% applied to wound bed Bathing/ Shower/ Hygiene: May shower with protection but do not get wound dressing(s) wet. Protect dressing(s) with water repellant cover (for example, large plastic bag) or a cast cover and may then take shower. Edema Control - Lymphedema / SCD / Other: Elevate legs to the level of the heart or above for 30 minutes daily and/or when sitting for 3-4 times a day throughout the day. - throughout the day Avoid standing for long periods of time. Exercise regularly Compression stocking or Garment 20-30 mm/Hg pressure to: - right leg daily WOUND #1: - Lower Leg Wound Laterality: Left, Medial Peri-Wound Care: Sween Lotion (Moisturizing lotion) 1 x Per Week/ Discharge Instructions: Apply moisturizing lotion as directed Prim Dressing: Maxorb Extra Ag+ Alginate Dressing, 2x2 (in/in) 1 x Per Week/ ary Discharge Instructions: Apply to wound bed as instructed Secondary Dressing: Woven Gauze Sponge, Non-Sterile 4x4 in 1 x Per Week/ Discharge Instructions: Apply over primary dressing as directed. Com pression Wrap: Urgo K2 Lite, (equivalent to a 3  layer) two layer compression system, regular 1 x Per Week/ Discharge Instructions: Apply Urgo K2 Lite as directed (alternative to 3 layer compression). 01/21/2023: The wound is smaller and more superficial. It is fairly clean with just a little bit of light slough on the surface. I used a curette to debride the slough and biofilm from the wound surface. We will continue silver alginate with 3 layer compression/equivalent. I will look for the results of his venous reflux study, anticipating  that he may be a candidate for saphenous vein ablation. Follow-up in 1 week. Electronic Signature(s) Signed: 01/22/2023 7:47:02 AM By: Duanne Guess MD FACS Signed: 01/22/2023 7:51:08 AM By: Shawn Stall RN, BSN Previous Signature: 01/21/2023 3:41:54 PM Version By: Duanne Guess MD FACS Entered By: Shawn Stall on 01/22/2023 07:42:22 -------------------------------------------------------------------------------- HxROS Details Patient Name: Date of Service: [name], [name] 01/21/2023 2:45 PM Medical Record Number: [id number] Patient Account Number: [id number] Date of Birth/Sex: Treating RN: [birth year] (89 y.o. M) Primary Care Provider: Alcide Clever Other Clinician: Referring Provider: Treating Provider/Extender: Duanne Guess [facility], MICHELLE Weeks in Treatment: 1 Information Obtained From Patient Chart Eyes Medical History: Positive for: Cataracts - bil extractions Negative for: Glaucoma; Optic Neuritis Ear/Nose/Mouth/Throat Medical History: Negative for: Chronic sinus problems/congestion; Middle ear problems Past Medical History Notes: seasonal allergies Cardiovascular Medical History: Positive for: Congestive Heart Failure; Coronary Artery Disease; Hypertension; Peripheral Venous Disease Endocrine Medical History: Negative for: Type I Diabetes; Type II Diabetes [name], [name] ([id number]) [id number] Page 7 of 8 Integumentary (Skin) Medical History: Negative for: History of Burn Musculoskeletal Medical History: Positive for: Osteoarthritis Past Medical History Notes: myositis, myalgia, left patella fracture, bursitis of hips, lumbar compression fx, spondylolistthesis Neurologic Medical History: Positive for: Neuropathy Past Medical History Notes: bil carpal tunnel Oncologic Medical History: Negative for: Received Chemotherapy; Received Radiation Past Medical History Notes: skin cancers Psychiatric Medical  History: Negative for: Anorexia/bulimia; Confinement Anxiety HBO Extended History Items Eyes: Cataracts Immunizations Pneumococcal Vaccine: Received Pneumococcal Vaccination: No Implantable Devices No devices added Hospitalization / Surgery History Type of Hospitalization/Surgery bil carpal tunnel release cardiac cath kyphoplasty ORIF patella left left total knee arthroplasty posterior lumbar fusion lumbar laminectomy posterior cervical fusion appendectomy blepharoplasty cholecystectomy bil catract extraction right knee replacement melanoma excision rectal surgery fissure vitrectomy Family and Social History Cancer: No; Diabetes: No; Heart Disease: No; Hereditary Spherocytosis: No; Hypertension: No; Kidney Disease: No; Lung Disease: Yes - Siblings; Seizures: No; Stroke: Yes - Siblings; Thyroid Problems: No; Tuberculosis: No; Former smoker; Marital Status - Married; Alcohol Use: Moderate; Drug Use: No History; Caffeine Use: Rarely; Financial Concerns: No; Food, Clothing or Shelter Needs: No; Support System Lacking: No; Transportation Concerns: No Electronic Signature(s) Signed: 01/21/2023 3:53:23 PM By: Duanne Guess MD FACS Entered By: Duanne Guess on 01/21/2023 15:39:21 SuperBill Details -------------------------------------------------------------------------------- [name] ([id number]) [id number] Page 8 of 8 Patient Name: Date of Service: [place] 01/21/2023 Medical Record Number: [id number] Patient Account Number: [id number] Date of Birth/Sex: Treating RN: [birth year] (89 y.o. M) Primary Care Provider: Alcide Clever Other Clinician: Referring Provider: Treating Provider/Extender: Duanne Guess [facility], MICHELLE Weeks in Treatment: 1 Diagnosis Coding ICD-10 Codes Code Description [phone] Non-pressure chronic ulcer of left ankle with fat layer exposed I87.2 Venous insufficiency (chronic)  (peripheral) I50.9 Heart failure, unspecified R60.0 Localized edema I25.10 Atherosclerotic heart disease of native coronary artery without angina pectoris Facility Procedures CPT4 Code Description Modifier Quantity [id number] - DEBRIDE WOUND 1ST 20 SQ CM OR < 1 ICD-10 Diagnosis Description L97.322 Non-pressure chronic ulcer of left  ankle with fat layer exposed Physician Procedures Quantity CPT4 Code Description Modifier [id number] - WC PHYS LEVEL 3 - EST PT 25 1 ICD-10 Diagnosis Description L97.322 Non-pressure chronic ulcer of left ankle with fat layer exposed I87.2 Venous insufficiency (chronic) (peripheral) R60.0 Localized edema I50.9 Heart failure, unspecified [id number] - WC PHYS DEBR WO ANESTH 20 SQ CM 1 ICD-10 Diagnosis Description L97.322 Non-pressure chronic ulcer of left ankle with fat layer exposed Electronic Signature(s) Signed: 01/21/2023 3:42:18 PM By: Duanne Guess MD FACS Entered By: Duanne Guess on 01/21/2023 15:42:18

## 2023-01-23 ENCOUNTER — Ambulatory Visit (INDEPENDENT_AMBULATORY_CARE_PROVIDER_SITE_OTHER): Payer: Medicare Other | Admitting: Surgical

## 2023-01-23 DIAGNOSIS — Z9889 Other specified postprocedural states: Secondary | ICD-10-CM

## 2023-01-24 ENCOUNTER — Other Ambulatory Visit (HOSPITAL_COMMUNITY): Payer: Self-pay | Admitting: General Surgery

## 2023-01-24 ENCOUNTER — Ambulatory Visit (HOSPITAL_COMMUNITY)
Admission: RE | Admit: 2023-01-24 | Discharge: 2023-01-24 | Disposition: A | Discharge status: Home / Self Care | Payer: Medicare Other | Source: Ambulatory Visit | Attending: Vascular Surgery | Admitting: Vascular Surgery

## 2023-01-24 DIAGNOSIS — R609 Edema, unspecified: Secondary | ICD-10-CM | POA: Diagnosis present

## 2023-01-24 LAB — WOUND CULTURE
MICRO NUMBER:: 14942374
SPECIMEN QUALITY:: ADEQUATE

## 2023-01-24 LAB — HOUSE ACCOUNT TRACKING

## 2023-01-25 ENCOUNTER — Encounter: Payer: Self-pay | Admitting: Surgical

## 2023-01-25 NOTE — Progress Notes (Cosign Needed Addendum)
Post-Op Visit Note   Patient: [name]           Date of Birth: March 27, 1932           MRN: [id number] Visit Date: 01/23/2023 PCP: Nona Dell, NP   Assessment & Plan:  Chief Complaint:  Chief Complaint  Patient presents with   Other    left carpal tunnel release with debridement of tenosynovitis.  Surgery was on 12/27/2022.    Visit Diagnoses:  1. S/P carpal tunnel release     Plan: Patient is an 89 year old male who presents s/p left carpal tunnel release with debridement of tenosynovitis in 12/27/2022.  Doing pretty well overall.  No fevers or chills.  No pain.  Functional improvement is consistent with last exam and he overall feels good about his decision to have the surgery.  Incision is healing well and it looks much improved compared with previous exam 9 days ago.  He has no expressible drainage.  There is just a very tiny amount of superficial gapping of the portion of the incision just distal to the wrist crease.  The wrist crease portion of the incision where there was a small opening has completely healed over and epithelialized.  Does have some continued swelling proximal to the wrist crease but this overall looks much improved compared with his previous exam.  Intact EPL, FPL, finger abduction, grip strength testing, thumb abduction/adduction.  2+ radial pulse of the operative extremity.  No evidence of cellulitis or skin changes.  Overall, he looks to be healed well enough that we can let him follow-up as needed.  He is walking with a walker and placing weight mostly in the palm distal to the incision.  Will see him back as needed but he understands that he can contact us at any time if he has concerns.    Follow-Up Instructions: No follow-ups on file.   Orders:  No orders of the defined types were placed in this encounter.  No orders of the defined types were placed in this encounter.   Imaging: VAS [place] LOWER EXTREMITY VENOUS REFLUX  Result Date:  01/24/2023  Lower Venous Reflux Study Patient Name:  [name]  Date of Exam:   01/24/2023 Medical Rec #: [id number]        Accession #:    [id number] Date of Birth: 17-Jun-1932         Patient Gender: M Patient Age:   53 years Exam Location:  [facility] Procedure:      VAS [place] LOWER EXTREMITY VENOUS REFLUX Referring Phys: Duanne Guess --------------------------------------------------------------------------------  Indications: Edema, and ulceration. Other Indications: Bandage left upper to distal calf. Performing Technologist: Elita Quick RVT  Examination Guidelines: A complete evaluation includes B-mode imaging, spectral Doppler, color Doppler, and power Doppler as needed of all accessible portions of each vessel. Bilateral testing is considered an integral part of a complete examination. Limited examinations for reoccurring indications may be performed as noted. The reflux portion of the exam is performed with the patient in reverse Trendelenburg. Significant venous reflux is defined as >500 ms in the superficial venous system, and >1 second in the deep venous system.  Venous Reflux Times +--------------+---------+------+-----------+------------+--------+ RIGHT         Reflux NoRefluxReflux TimeDiameter cmsComments                         Yes                                  +--------------+---------+------+-----------+------------+--------+  CFV                     yes   >1 second                      +--------------+---------+------+-----------+------------+--------+ FV mid        no                                             +--------------+---------+------+-----------+------------+--------+ Popliteal     no                                             +--------------+---------+------+-----------+------------+--------+ GSV at SFJ              yes    >500 ms      0.86             +--------------+---------+------+-----------+------------+--------+  GSV prox thigh          yes    >500 ms      0.89             +--------------+---------+------+-----------+------------+--------+ GSV mid thigh           yes    >500 ms      0.66             +--------------+---------+------+-----------+------------+--------+ GSV dist thigh          yes    >500 ms      0.57             +--------------+---------+------+-----------+------------+--------+ GSV at knee             yes    >500 ms      0.58             +--------------+---------+------+-----------+------------+--------+ GSV prox calf           yes    >500 ms      0.63             +--------------+---------+------+-----------+------------+--------+ GSV mid calf  no                            0.64             +--------------+---------+------+-----------+------------+--------+ SSV Pop Fossa no                            0.39             +--------------+---------+------+-----------+------------+--------+ SSV prox calf           yes    >500 ms      0.41             +--------------+---------+------+-----------+------------+--------+ SSV mid calf            yes    >500 ms      0.29             +--------------+---------+------+-----------+------------+--------+ AASV O                  yes    >500 ms      0.42             +--------------+---------+------+-----------+------------+--------+ AASV P  no                                    NV       +--------------+---------+------+-----------+------------+--------+  +--------------+---------+------+-----------+------------+--------+ LEFT          Reflux NoRefluxReflux TimeDiameter cmsComments                         Yes                                  +--------------+---------+------+-----------+------------+--------+ CFV           no                                             +--------------+---------+------+-----------+------------+--------+ FV mid        no                                              +--------------+---------+------+-----------+------------+--------+ Popliteal               yes   >1 second                      +--------------+---------+------+-----------+------------+--------+ GSV at [facility]    no                            0.31             +--------------+---------+------+-----------+------------+--------+ GSV prox thighno                            0.77             +--------------+---------+------+-----------+------------+--------+ GSV mid thigh no                            0.50             +--------------+---------+------+-----------+------------+--------+ GSV dist thighno                            0.53             +--------------+---------+------+-----------+------------+--------+ GSV at knee             yes    >500 ms      0.49             +--------------+---------+------+-----------+------------+--------+ GSV prox calf no                            0.46             +--------------+---------+------+-----------+------------+--------+ GSV mid calf  no                            0.36             +--------------+---------+------+-----------+------------+--------+ SSV Pop Fossa no  0.46             +--------------+---------+------+-----------+------------+--------+ SSV prox calf                               0.40             +--------------+---------+------+-----------+------------+--------+ SSV mid calf  no                            0.41             +--------------+---------+------+-----------+------------+--------+ AASV O                                              NV       +--------------+---------+------+-----------+------------+--------+   Summary: Right: - No evidence of deep vein thrombosis seen in the right lower extremity, from the common femoral through the popliteal veins. - Venous reflux is noted in the right common femoral vein. - Venous reflux is  noted in the right sapheno-femoral junction. - Venous reflux is noted in the right greater saphenous vein in the thigh. - Venous reflux is noted in the right greater saphenous vein in the calf. - Reflux at origin of AASV.  Left: - No evidence of deep vein thrombosis seen in the left lower extremity, from the common femoral through the popliteal veins. - No evidence of superficial venous reflux seen in the left short saphenous vein. - Venous reflux is noted in the left greater saphenous vein in the knee..  *See table(s) above for measurements and observations.    Preliminary     PMFS History: Patient Active Problem List   Diagnosis Date Noted   Flexor carpi radialis tenosynovitis 01/13/2023   HCAP (healthcare-associated pneumonia) 08/16/2022   Chest pain 07/27/2022   CHF (congestive heart failure) (HCC) 07/24/2022   Hx pulmonary embolism 06/20/2022   Hypokalemia 06/20/2022   Abnormal EKG 06/19/2022   Fracture of twelfth thoracic vertebra (HCC) 01/03/2022   Opioid dependence (HCC) 01/03/2022   Poor balance 01/03/2022   Pedal edema 01/03/2022   Bilateral wrist pain 06/19/2021   Fall 01/11/2021   Orthostatic hypotension    Weakness    Osteoporosis 10/07/2020   Lumbar compression fracture (HCC) 09/07/2020   Greater trochanteric bursitis of both hips 05/04/2020   Left patella fracture 01/19/2020   Arthritis of left knee 05/07/2019   Pre-operative clearance 04/28/2019   Acute on chronic systolic CHF (congestive heart failure) (HCC) 08/10/2018   Leg wound, left 03/19/2018   Right arm pain 11/11/2017   Primary osteoarthritis of right foot 08/21/2017   Syncope 02/27/2017   Arthritis of right ankle 12/18/2016   Carpal tunnel syndrome, left upper limb 09/04/2016   Leg swelling 08/16/2016   Hand weakness 07/12/2016   Degenerative arthritis of left knee 06/28/2016   Body mass index (bmi) 28.0-28.9, adult 06/18/2016   S/P cervical spinal fusion 06/14/2016   Iron deficiency anemia 03/16/2016    Arthritis of left acromioclavicular joint 01/03/2016   Rotator cuff disorder 01/03/2016   Osteoarthritis of left knee 04/30/2015   Baker's cyst of knee 04/30/2015   Esophageal stricture 04/26/2015   Benign prostatic hyperplasia 02/21/2015   Constipation 02/21/2015   Myalgia and myositis 11/18/2014   Status post lumbar spine operation 06/25/2014   Pseudoarthrosis of cervical spine (  HCC) 05/25/2014   Sacral fracture, closed (HCC) 09/23/2013   Spondylolisthesis, congenital 09/02/2013   Polyarticular arthritis 07/19/2013   Spondylosis of lumbosacral spine at multiple levels without myelopathy 04/24/2013   Chronic radicular low back pain 12/15/2012   Gout 02/20/2010   ERECTILE DYSFUNCTION, ORGANIC 02/20/2010   Hyperlipidemia 10/18/2008   CAD (coronary artery disease) 08/18/2007   Essential hypertension 05/09/2007   ALLERGIC RHINITIS 03/27/2007   Past Medical History:  Diagnosis Date   ACTINIC KERATOSIS, HEAD 03/23/2008   ALLERGIC RHINITIS 03/27/2007   Arthritis    BENIGN PROSTATIC HYPERTROPHY, WITH URINARY OBSTRUCTION 10/16/2007   CAD 08/18/2007   "patient denies any issues with heart, does not see cardiologist"   Carpal tunnel syndrome, bilateral 09/04/2016   CARPAL TUNNEL SYNDROME, LEFT 04/13/2008   CHF (congestive heart failure) (HCC)    Chronic back pain    Chronic neck pain    Complication of anesthesia    left lower jaw teeth have been injured in the past and he has lost those, now has 2 loose front teeth due to a mouth injury on 01/02/20   DVT (deep venous thrombosis) (HCC)    ERECTILE DYSFUNCTION, ORGANIC 02/20/2010   Gout, unspecified 02/20/2010   Headache(784.0)    migraines hx of- started at age 65- none since 65   History of melanoma    HYPERLIPIDEMIA, WITH HIGH HDL 10/18/2008   HYPERTENSION 05/09/2007   sees Dr. 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 meyloma  on truckl.   Patellar tendinitis 10/10/2009   Sciatic nerve pain    on Oxycodone as needed   SKIN CANCER, HX OF 03/27/2007   UNS ADVRS EFF UNS RX MEDICINAL&BIOLOGICAL SBSTNC 08/18/2007   Urinary urgency     Family History  Problem Relation Age of Onset   Dementia Mother    Anemia Father    Heart disease Father    Stroke Sister        ICH   Lung disease Sister     Past Surgical History:  Procedure Laterality Date   APPENDECTOMY  1969   APPLICATION OF WOUND VAC Left 01/19/2020   Procedure: Application Of Wound Vac;  Surgeon: Cammy Copa, MD;  Location: [facility] OR;  Service: Orthopedics;  Laterality: Left;   BLEPHAROPLASTY     CARPAL TUNNEL RELEASE Right    CARPAL TUNNEL RELEASE Left 12/27/2022   Procedure: CARPAL TUNNEL RELEASE WITH GANGLION CYST EXCISION;  Surgeon: Cammy Copa, MD;  Location: [facility] OR;  Service: Orthopedics;  Laterality: Left;   CHOLECYSTECTOMY     ESOPHAGOGASTRODUODENOSCOPY (EGD) WITH PROPOFOL N/A 04/26/2015   Procedure: ESOPHAGOGASTRODUODENOSCOPY (EGD) WITH PROPOFOL;  Surgeon: Louis Meckel, MD;  Location: [facility];  Service: Endoscopy;  Laterality: N/A;   EYE SURGERY     bilateral cataracts removed and vitrectomies   JOINT REPLACEMENT Right    knee   KNEE ARTHROSCOPY     KYPHOPLASTY N/A 06/10/2020   Procedure: Thoracic Twelve, Lumbar One, Lumbar Two Kyphoplasty;  Surgeon: Coletta Memos, MD;  Location: [facility] OR;  Service: Neurosurgery;  Laterality: N/A;  3C/RM 21   KYPHOPLASTY N/A 09/07/2020   Procedure: Lumbar three KYPHOPLASTY;  Surgeon: Coletta Memos, MD;  Location: [facility] OR;  Service: Neurosurgery;  Laterality: N/A;   LAMINECTOMY     LATERAL FUSION LUMBAR SPINE, TRANSVERSE     LEFT HEART CATH AND CORONARY ANGIOGRAPHY N/A 07/27/2022  Procedure: LEFT HEART CATH AND CORONARY ANGIOGRAPHY;  Surgeon: Orpah Cobb, MD;  Location: [facility];  Service: Cardiovascular;  Laterality: N/A;   LUMBAR LAMINECTOMY/DECOMPRESSION MICRODISCECTOMY Left 11/03/2012    Procedure: LUMBAR LAMINECTOMY/DECOMPRESSION MICRODISCECTOMY 1 LEVEL;  Surgeon: Hewitt Shorts, MD;  Location: [facility];  Service: Neurosurgery;  Laterality: Left;  LEFT L5S1 laminotomy foraminotomy and possible microdiskectomy   MELANOMA EXCISION     scalp   NASAL SINUS SURGERY     ORIF PATELLA Left 01/19/2020   Procedure: LEFT OPEN REDUCTION INTERNAL (ORIF) FIXATION PATELLA WITH AUTO GRAFTING USING HAMSTRING REINFORCEMENT;  Surgeon: Cammy Copa, MD;  Location: [facility] OR;  Service: Orthopedics;  Laterality: Left;   POSTERIOR CERVICAL FUSION/FORAMINOTOMY Right 10/01/2012   Procedure: POSTERIOR CERVICAL FUSION/FORAMINOTOMY LEVEL 1;  Surgeon: Hewitt Shorts, MD;  Location: [facility];  Service: Neurosurgery;  Laterality: Right;  Right Cervical seven thoracic one Cervical laminectomy/foraminotomy with posterior cervical arthrodesis    POSTERIOR LUMBAR FUSION N/A 05/23/2013   Procedure: exploration of lumbar wound, explantation of left interbody implant.;  Surgeon: Hewitt Shorts, MD;  Location: [facility] OR;  Service: Neurosurgery;  Laterality: N/A;   RECTAL SURGERY     Fissure   TOTAL KNEE ARTHROPLASTY Left 05/07/2019   Procedure: LEFT TOTAL KNEE ARTHROPLASTY;  Surgeon: Cammy Copa, MD;  Location: [facility] OR;  Service: Orthopedics;  Laterality: Left;   VITRECTOMY     right and left 2013   Social History   Occupational History   Occupation: Retired  Tobacco Use   Smoking status: Former    Packs/day: 1.00    Years: 33.00    Additional pack years: 0.00    Total pack years: 33.00    Types: Cigarettes    Quit date: 09/29/1981    Years since quitting: 41.3   Smokeless tobacco: Never  Vaping Use   Vaping Use: Never used  Substance and Sexual Activity   Alcohol use: Yes    Alcohol/week: 21.0 standard drinks of alcohol    Types: 21 Shots of liquor per week    Comment: finishes 1/2L per week dark rum.- 3 shots of liquor a day   Drug use: No   Sexual activity: Not Currently     Partners: Female    Comment: Married

## 2023-01-29 ENCOUNTER — Encounter (HOSPITAL_BASED_OUTPATIENT_CLINIC_OR_DEPARTMENT_OTHER): Payer: Medicare Other | Admitting: General Surgery

## 2023-01-29 DIAGNOSIS — L97322 Non-pressure chronic ulcer of left ankle with fat layer exposed: Secondary | ICD-10-CM | POA: Diagnosis not present

## 2023-01-29 NOTE — Progress Notes (Signed)
JARIC, BENETTI ([id number]) [id number] Page 1 of 9 Visit Report for 01/29/2023 Chief Complaint Document Details Patient Name: Date of Service: [name] 01/29/2023 10:00 A M Medical Record Number: [id number] Patient Account Number: [id number] Date of Birth/Sex: Treating RN: 1932/08/09 (89 y.o. M) Primary Care Provider: Alcide Clever Other Clinician: Referring Provider: Treating Provider/Extender: Duanne Guess [facility], MICHELLE Weeks in Treatment: 2 Information Obtained from: Patient Chief Complaint Patient presents for treatment of an open ulcer due to venous insufficiency Electronic Signature(s) Signed: 01/29/2023 10:27:55 AM By: Duanne Guess MD FACS Entered By: Duanne Guess on 01/29/2023 10:27:54 -------------------------------------------------------------------------------- Debridement Details Patient Name: Date of Service: [name], [name] 01/29/2023 10:00 A M Medical Record Number: [id number] Patient Account Number: [id number] Date of Birth/Sex: Treating RN: 1932/04/23 (89 y.o. [name] Primary Care Provider: Alcide Clever Other Clinician: Referring Provider: Treating Provider/Extender: Duanne Guess [facility], MICHELLE Weeks in Treatment: 2 Debridement Performed for Assessment: Wound #1 Left,Medial Lower Leg Performed By: Physician Duanne Guess, MD Debridement Type: Debridement Severity of Tissue Pre Debridement: Fat layer exposed Level of Consciousness (Pre-procedure): Awake and Alert Pre-procedure Verification/Time Out Yes - 10:09 Taken: Start Time: 10:09 Pain Control: Lidocaine 4% T opical Solution Percent of Wound Bed Debrided: 100% T Area Debrided (cm): otal 1.26 Tissue and other material debrided: Non-Viable, Slough, Slough Level: Non-Viable Tissue Debridement Description: Selective/Open Wound Instrument: Curette Bleeding: Minimum Hemostasis Achieved: Pressure Response to  Treatment: Procedure was tolerated well Level of Consciousness (Post- Awake and Alert procedure): Post Debridement Measurements of Total Wound Length: (cm) 2 Width: (cm) 0.8 Depth: (cm) 0.1 Volume: (cm) 0.126 Character of Wound/Ulcer Post Debridement: Improved Severity of Tissue Post Debridement: Fat layer exposed Post Procedure Diagnosis [name], [name] ([id number]) [id number] Page 2 of 9 Same as Pre-procedure Notes scribed for Dr. Lady Gary by Samuella Bruin, RN Electronic Signature(s) Signed: 01/29/2023 11:49:02 AM By: Duanne Guess MD FACS Signed: 01/29/2023 4:05:21 PM By: Samuella Bruin Entered By: Samuella Bruin on 01/29/2023 10:11:49 -------------------------------------------------------------------------------- HPI Details Patient Name: Date of Service: [name], [name] 01/29/2023 10:00 A M Medical Record Number: [id number] Patient Account Number: [id number] Date of Birth/Sex: Treating RN: Apr 21, 1932 (89 y.o. M) Primary Care Provider: Alcide Clever Other Clinician: Referring Provider: Treating Provider/Extender: Duanne Guess [facility], MICHELLE Weeks in Treatment: 2 History of Present Illness HPI Description: ADMISSION 01/11/2023 This is an 89 year old nondiabetic with a history of chronic venous insufficiency, coronary artery disease, congestive heart failure, and lower extremity edema. He has historically been followed by podiatry for various ulcers and calluses on his feet. When he was seen in the middle of April, he was noted to have an ulcer starting on his left ankle. He was prescribed a course of cephalexin and advised to apply Betadine to the wound with an Ace wrap. He was referred to the wound care center for further evaluation and management. Apparently the wound closed of its own accord prior to his appointment with [place], but subsequently reopened. The notes from podiatry do indicate that compression and elevation  were discussed as means to help heal ulceration and avoid future episodes. After the wound reopened, he was seen again and prescribed a course of Augmentin. He is now here for his wound care appointment. 01/21/2023: The wound is smaller and more superficial. It is fairly clean with just a little bit of light slough on the surface. His venous reflux studies are scheduled for this coming Thursday. 01/29/2023: The wound is smaller again today and very superficial. There is minimal slough  present with a little eschar around the edges. His venous reflux studies were completed, and as expected, he has severe venous reflux. Electronic Signature(s) Signed: 01/29/2023 10:28:35 AM By: Duanne Guess MD FACS Entered By: Duanne Guess on 01/29/2023 10:28:35 -------------------------------------------------------------------------------- Physical Exam Details Patient Name: Date of Service: [name], [name] 01/29/2023 10:00 A M Medical Record Number: [id number] Patient Account Number: [id number] Date of Birth/Sex: Treating RN: June 20, 1932 (89 y.o. M) Primary Care Provider: Alcide Clever Other Clinician: Referring Provider: Treating Provider/Extender: Duanne Guess [facility], MICHELLE Weeks in Treatment: 2 Constitutional Hypertensive, asymptomatic. . . . no acute distress. Respiratory Normal work of breathing on room air. Notes 01/29/2023: The wound is smaller again today and very superficial. There is minimal slough present with a little eschar around the edges. [name], [name] ([id number]) [id number] Page 3 of 9 Electronic Signature(s) Signed: 01/29/2023 10:29:33 AM By: Duanne Guess MD FACS Entered By: Duanne Guess on 01/29/2023 10:29:33 -------------------------------------------------------------------------------- Physician Orders Details Patient Name: Date of Service: [name], [name] 01/29/2023 10:00 A M Medical Record Number: [id number] Patient  Account Number: [id number] Date of Birth/Sex: Treating RN: Jun 07, 1932 (89 y.o. [name] Primary Care Provider: Alcide Clever Other Clinician: Referring Provider: Treating Provider/Extender: Duanne Guess [facility], MICHELLE Weeks in Treatment: 2 Verbal / Phone Orders: No Diagnosis Coding ICD-10 Coding Code Description [id number] Non-pressure chronic ulcer of left ankle with fat layer exposed I87.2 Venous insufficiency (chronic) (peripheral) I50.9 Heart failure, unspecified R60.0 Localized edema I25.10 Atherosclerotic heart disease of native coronary artery without angina pectoris Follow-up Appointments ppointment in 1 week. - Dr. Lady Gary RM 1 Return A Anesthetic Wound #1 Left,Medial Lower Leg (In clinic) Topical Lidocaine 4% applied to wound bed Bathing/ Shower/ Hygiene May shower with protection but do not get wound dressing(s) wet. Protect dressing(s) with water repellant cover (for example, large plastic bag) or a cast cover and may then take shower. Edema Control - Lymphedema / SCD / Other Elevate legs to the level of the heart or above for 30 minutes daily and/or when sitting for 3-4 times a day throughout the day. - throughout the day Avoid standing for long periods of time. Exercise regularly Compression stocking or Garment 20-30 mm/Hg pressure to: - right leg daily Wound Treatment Wound #1 - Lower Leg Wound Laterality: Left, Medial Cleanser: Soap and Water 1 x Per Week/30 Days Discharge Instructions: May shower and wash wound with dial antibacterial soap and water prior to dressing change. Cleanser: Wound Cleanser 1 x Per Week/30 Days Discharge Instructions: Cleanse the wound with wound cleanser prior to applying a clean dressing using gauze sponges, not tissue or cotton balls. Peri-Wound Care: Sween Lotion (Moisturizing lotion) 1 x Per Week/30 Days Discharge Instructions: Apply moisturizing lotion as directed Prim Dressing: Maxorb Extra Ag+ Alginate  Dressing, 2x2 (in/in) 1 x Per Week/30 Days ary Discharge Instructions: Apply to wound bed as instructed Secondary Dressing: Woven Gauze Sponge, Non-Sterile 4x4 in 1 x Per Week/30 Days Discharge Instructions: Apply over primary dressing as directed. Compression Wrap: Urgo K2 Lite, (equivalent to a 3 layer) two layer compression system, regular 1 x Per Week/30 Days Discharge Instructions: Apply Urgo K2 Lite as directed (alternative to 3 layer compression). [facility] - severe venous reflux - evaluate for saphenous vein ablation ICD10: I87.2, L97.322 IZAAK, ARMS ([id number]) [id number] Page 4 of 9 Patient Medications llergies: amlodipine, Lyrica, gabapentin A Notifications Medication Indication Start End 01/29/2023 lidocaine DOSE topical 4 % cream - cream topical Electronic Signature(s) Signed: 01/29/2023 11:49:02  AM By: Duanne Guess MD FACS Entered By: Duanne Guess on 01/29/2023 10:29:57 -------------------------------------------------------------------------------- Problem List Details Patient Name: Date of Service: [name], Donnell [name]. 01/29/2023 10:00 A M Medical Record Number: [id number] Patient Account Number: [id number] Date of Birth/Sex: Treating RN: 10/01/1931 (89 y.o. M) Primary Care Provider: Alcide Clever Other Clinician: Referring Provider: Treating Provider/Extender: Duanne Guess [facility], MICHELLE Weeks in Treatment: 2 Active Problems ICD-10 Encounter Code Description Active Date MDM Diagnosis L97.322 Non-pressure chronic ulcer of left ankle with fat layer exposed 01/11/2023 No Yes I87.2 Venous insufficiency (chronic) (peripheral) 01/11/2023 No Yes I50.9 Heart failure, unspecified 01/11/2023 No Yes R60.0 Localized edema 01/11/2023 No Yes I25.10 Atherosclerotic heart disease of native coronary artery without angina pectoris 01/11/2023 No Yes Inactive Problems Resolved Problems Electronic  Signature(s) Signed: 01/29/2023 10:26:53 AM By: Duanne Guess MD FACS Entered By: Duanne Guess on 01/29/2023 10:26:53 [name] ([id number]) [id number] Page 5 of 9 -------------------------------------------------------------------------------- Progress Note Details Patient Name: Date of Service: [place], [name] 01/29/2023 10:00 A M Medical Record Number: [id number] Patient Account Number: [id number] Date of Birth/Sex: Treating RN: [birth year] (89 y.o. M) Primary Care Provider: Alcide Clever Other Clinician: Referring Provider: Treating Provider/Extender: Duanne Guess [facility], MICHELLE Weeks in Treatment: 2 Subjective Chief Complaint Information obtained from Patient Patient presents for treatment of an open ulcer due to venous insufficiency History of Present Illness (HPI) ADMISSION 01/11/2023 This is an 89 year old nondiabetic with a history of chronic venous insufficiency, coronary artery disease, congestive heart failure, and lower extremity edema. He has historically been followed by podiatry for various ulcers and calluses on his feet. When he was seen in the middle of April, he was noted to have an ulcer starting on his left ankle. He was prescribed a course of cephalexin and advised to apply Betadine to the wound with an Ace wrap. He was referred to the wound care center for further evaluation and management. Apparently the wound closed of its own accord prior to his appointment with [place], but subsequently reopened. The notes from podiatry do indicate that compression and elevation were discussed as means to help heal ulceration and avoid future episodes. After the wound reopened, he was seen again and prescribed a course of Augmentin. He is now here for his wound care appointment. 01/21/2023: The wound is smaller and more superficial. It is fairly clean with just a little bit of light slough on the surface. His venous reflux studies  are scheduled for this coming Thursday. 01/29/2023: The wound is smaller again today and very superficial. There is minimal slough present with a little eschar around the edges. His venous reflux studies were completed, and as expected, he has severe venous reflux. Patient History Information obtained from Patient, Chart. Family History Lung Disease - Siblings, Stroke - Siblings, No family history of Cancer, Diabetes, Heart Disease, Hereditary Spherocytosis, Hypertension, Kidney Disease, Seizures, Thyroid Problems, Tuberculosis. Social History Former smoker, Marital Status - Married, Alcohol Use - Moderate, Drug Use - No History, Caffeine Use - Rarely. Medical History Eyes Patient has history of Cataracts - bil extractions Denies history of Glaucoma, Optic Neuritis Ear/Nose/Mouth/Throat Denies history of Chronic sinus problems/congestion, Middle ear problems Cardiovascular Patient has history of Congestive Heart Failure, Coronary Artery Disease, Hypertension, Peripheral Venous Disease Endocrine Denies history of Type I Diabetes, Type II Diabetes Integumentary (Skin) Denies history of History of Burn Musculoskeletal Patient has history of Osteoarthritis Neurologic Patient has history of Neuropathy Oncologic Denies history of Received Chemotherapy, Received Radiation Psychiatric Denies history of Anorexia/bulimia, Confinement  Anxiety Hospitalization/Surgery History - bil carpal tunnel release. - cardiac cath. - kyphoplasty. - ORIF patella left. - left total knee arthroplasty. - posterior lumbar fusion. - lumbar laminectomy. - posterior cervical fusion. - appendectomy. - blepharoplasty. - cholecystectomy. - bil catract extraction. - right knee replacement. - melanoma excision. - rectal surgery fissure. - vitrectomy. Medical A Surgical History Notes nd Ear/Nose/Mouth/Throat seasonal allergies Musculoskeletal myositis, myalgia, left patella fracture, bursitis of hips, lumbar  compression fx, spondylolistthesis Neurologic bil carpal tunnel Oncologic skin cancers [name], [name] ([id number]) [id number] Page 6 of 9 Objective Constitutional Hypertensive, asymptomatic. no acute distress. Vitals Time Taken: 10:00 AM, Height: 72 in, Weight: 198 lbs, BMI: 26.9, Temperature: 98.8 F, Pulse: 93 bpm, Respiratory Rate: 18 breaths/min, Blood Pressure: 151/77 mmHg. Respiratory Normal work of breathing on room air. General Notes: 01/29/2023: The wound is smaller again today and very superficial. There is minimal slough present with a little eschar around the edges. Integumentary (Hair, Skin) Wound #1 status is Open. Original cause of wound was Gradually Appeared. The date acquired was: 05/12/2022. The wound has been in treatment 2 weeks. The wound is located on the Left,Medial Lower Leg. The wound measures 2cm length x 0.8cm width x 0.1cm depth; 1.257cm^2 area and 0.126cm^3 volume. There is Fat Layer (Subcutaneous Tissue) exposed. There is no tunneling or undermining noted. There is a medium amount of serosanguineous drainage noted. The wound margin is flat and intact. There is large (67-100%) pink granulation within the wound bed. There is a small (1-33%) amount of necrotic tissue within the wound bed including Adherent Slough. The periwound skin appearance had no abnormalities noted for texture. The periwound skin appearance had no abnormalities noted for moisture. The periwound skin appearance exhibited: Hemosiderin Staining. Periwound temperature was noted as No Abnormality. Assessment Active Problems ICD-10 Non-pressure chronic ulcer of left ankle with fat layer exposed Venous insufficiency (chronic) (peripheral) Heart failure, unspecified Localized edema Atherosclerotic heart disease of native coronary artery without angina pectoris Procedures Wound #1 Pre-procedure diagnosis of Wound #1 is a Venous Leg Ulcer located on the Left,Medial  Lower Leg .Severity of Tissue Pre Debridement is: Fat layer exposed. There was a Selective/Open Wound Non-Viable Tissue Debridement with a total area of 1.26 sq cm performed by Duanne Guess, MD. With the following instrument(s): Curette to remove Non-Viable tissue/material. Material removed includes [facility] after achieving pain control using Lidocaine 4% Topical Solution. No specimens were taken. A time out was conducted at 10:09, prior to the start of the procedure. A Minimum amount of bleeding was controlled with Pressure. The procedure was tolerated well. Post Debridement Measurements: 2cm length x 0.8cm width x 0.1cm depth; 0.126cm^3 volume. Character of Wound/Ulcer Post Debridement is improved. Severity of Tissue Post Debridement is: Fat layer exposed. Post procedure Diagnosis Wound #1: Same as Pre-Procedure General Notes: scribed for Dr. Lady Gary by Samuella Bruin, RN. Pre-procedure diagnosis of Wound #1 is a Venous Leg Ulcer located on the Left,Medial Lower Leg . There was a Double Layer Compression Therapy Procedure by Samuella Bruin, RN. Post procedure Diagnosis Wound #1: Same as Pre-Procedure Plan Follow-up Appointments: Return Appointment in 1 week. - Dr. Lady Gary RM 1 Anesthetic: Wound #1 Left,Medial Lower Leg: (In clinic) Topical Lidocaine 4% applied to wound bed Bathing/ Shower/ Hygiene: May shower with protection but do not get wound dressing(s) wet. Protect dressing(s) with water repellant cover (for example, large plastic bag) or a cast cover and may then take shower. Edema Control - Lymphedema / SCD / Other: Elevate legs to the level  of the heart or above for 30 minutes daily and/or when sitting for 3-4 times a day throughout the day. - throughout the day Avoid standing for long periods of time. Exercise regularly Compression stocking or Garment 20-30 mm/Hg pressure to: - right leg daily ordered were: Vein and Vascular - severe venous reflux - evaluate for saphenous  vein ablation ICD10: I87.2, L97.322 The following medication(s) was prescribed: lidocaine topical 4 % cream cream topical was prescribed at facility WOUND #1: - Lower Leg Wound Laterality: Left, Medial Cleanser: Soap and Water 1 x Per Week/30 Days Discharge Instructions: May shower and wash wound with dial antibacterial soap and water prior to dressing change. Cleanser: Wound Cleanser 1 x Per Week/30 Days Discharge Instructions: Cleanse the wound with wound cleanser prior to applying a clean dressing using gauze sponges, not tissue or cotton balls. [name], [name] ([id number]) [id number] Page 7 of 9 Peri-Wound Care: Sween Lotion (Moisturizing lotion) 1 x Per Week/30 Days Discharge Instructions: Apply moisturizing lotion as directed Prim Dressing: Maxorb Extra Ag+ Alginate Dressing, 2x2 (in/in) 1 x Per Week/30 Days ary Discharge Instructions: Apply to wound bed as instructed Secondary Dressing: Woven Gauze Sponge, Non-Sterile 4x4 in 1 x Per Week/30 Days Discharge Instructions: Apply over primary dressing as directed. Com pression Wrap: Urgo K2 Lite, (equivalent to a 3 layer) two layer compression system, regular 1 x Per Week/30 Days Discharge Instructions: Apply Urgo K2 Lite as directed (alternative to 3 layer compression). 01/29/2023: The wound is smaller again today and very superficial. There is minimal slough present with a little eschar around the edges. I used a curette to debride slough and eschar from his wound. We will continue silver alginate with 3 layer compression/equivalent. We will place a referral to vascular surgery to discuss saphenous vein ablation. Follow-up in 1 week. Electronic Signature(s) Signed: 01/29/2023 10:30:36 AM By: Duanne Guess MD FACS Entered By: Duanne Guess on 01/29/2023 10:30:35 -------------------------------------------------------------------------------- HxROS Details Patient Name: Date of Service: [name], Romell [name].  01/29/2023 10:00 A M Medical Record Number: [id number] Patient Account Number: [id number] Date of Birth/Sex: Treating RN: [birth year] (89 y.o. M) Primary Care Provider: Alcide Clever Other Clinician: Referring Provider: Treating Provider/Extender: Duanne Guess [facility], MICHELLE Weeks in Treatment: 2 Information Obtained From Patient Chart Eyes Medical History: Positive for: Cataracts - bil extractions Negative for: Glaucoma; Optic Neuritis Ear/Nose/Mouth/Throat Medical History: Negative for: Chronic sinus problems/congestion; Middle ear problems Past Medical History Notes: seasonal allergies Cardiovascular Medical History: Positive for: Congestive Heart Failure; Coronary Artery Disease; Hypertension; Peripheral Venous Disease Endocrine Medical History: Negative for: Type I Diabetes; Type II Diabetes Integumentary (Skin) Medical History: Negative for: History of Burn Musculoskeletal Medical History: Positive for: Osteoarthritis Past Medical History Notes: myositis, myalgia, left patella fracture, bursitis of hips, lumbar compression fx, spondylolistthesis Neurologic [name], [name] ([id number]) [id number] Page 8 of 9 Medical History: Positive for: Neuropathy Past Medical History Notes: bil carpal tunnel Oncologic Medical History: Negative for: Received Chemotherapy; Received Radiation Past Medical History Notes: skin cancers Psychiatric Medical History: Negative for: Anorexia/bulimia; Confinement Anxiety HBO Extended History Items Eyes: Cataracts Immunizations Pneumococcal Vaccine: Received Pneumococcal Vaccination: No Implantable Devices No devices added Hospitalization / Surgery History Type of Hospitalization/Surgery bil carpal tunnel release cardiac cath kyphoplasty ORIF patella left left total knee arthroplasty posterior lumbar fusion lumbar laminectomy posterior cervical  fusion appendectomy blepharoplasty cholecystectomy bil catract extraction right knee replacement melanoma excision rectal surgery fissure vitrectomy Family and Social History Cancer: No; Diabetes: No; Heart Disease: No; Hereditary Spherocytosis: No; Hypertension: No; Kidney  Disease: No; Lung Disease: Yes - Siblings; Seizures: No; Stroke: Yes - Siblings; Thyroid Problems: No; Tuberculosis: No; Former smoker; Marital Status - Married; Alcohol Use: Moderate; Drug Use: No History; Caffeine Use: Rarely; Financial Concerns: No; Food, Clothing or Shelter Needs: No; Support System Lacking: No; Transportation Concerns: No Electronic Signature(s) Signed: 01/29/2023 11:49:02 AM By: Duanne Guess MD FACS Entered By: Duanne Guess on 01/29/2023 10:29:12 -------------------------------------------------------------------------------- SuperBill Details Patient Name: Date of Service: [name], [name] 01/29/2023 Medical Record Number: [id number] Patient Account Number: [id number] Date of Birth/Sex: Treating RN: 1931/09/28 (89 y.o. M) Primary Care Provider: Alcide Clever Other Clinician: Referring Provider: Treating Provider/Extender: Duanne Guess [facility], MICHELLE Weeks in Treatment: 2 Diagnosis Coding [name], [name] ([id number]) [id number] Page 9 of 9 ICD-10 Codes Code Description [id number] Non-pressure chronic ulcer of left ankle with fat layer exposed I87.2 Venous insufficiency (chronic) (peripheral) I50.9 Heart failure, unspecified R60.0 Localized edema I25.10 Atherosclerotic heart disease of native coronary artery without angina pectoris Facility Procedures : CPT4 Code: [id number] Description: 97597 - DEBRIDE WOUND 1ST 20 SQ CM OR < ICD-10 Diagnosis Description L97.322 Non-pressure chronic ulcer of left ankle with fat layer exposed Modifier: Quantity: 1 Physician Procedures : CPT4 Code Description Modifier [id number] - WC PHYS LEVEL 4 - EST  PT 25 ICD-10 Diagnosis Description L97.322 Non-pressure chronic ulcer of left ankle with fat layer exposed I87.2 Venous insufficiency (chronic) (peripheral) I50.9 Heart failure,  unspecified R60.0 Localized edema Quantity: 1 : [id number] 97597 - WC PHYS DEBR WO ANESTH 20 SQ CM ICD-10 Diagnosis Description L97.322 Non-pressure chronic ulcer of left ankle with fat layer exposed Quantity: 1 Electronic Signature(s) Signed: 01/29/2023 10:31:11 AM By: Duanne Guess MD FACS Entered By: Duanne Guess on 01/29/2023 10:31:11

## 2023-01-29 NOTE — Progress Notes (Signed)
LANDREY, FELIZ ([name]) [id number] Page 1 of 8 Visit Report for 01/29/2023 Arrival Information Details Patient Name: Date of Service: [name] 01/29/2023 10:00 A M Medical Record Number: [id number] Patient Account Number: [id number] Date of Birth/Sex: Treating RN: 05-May-1932 (89 y.o. [name] Primary Care Sabree Nuon: Alcide Clever Other Clinician: Referring Shaleen Talamantez: Treating Bernadine Melecio/Extender: Duanne Guess [facility], MICHELLE Weeks in Treatment: 2 Visit Information History Since Last Visit Added or deleted any medications: No Patient Arrived: Walker Any new allergies or adverse reactions: No Arrival Time: 09:56 Had a fall or experienced change in No Accompanied By: self activities of daily living that may affect Transfer Assistance: None risk of falls: Patient Identification Verified: Yes Signs or symptoms of abuse/neglect since last visito No Secondary Verification Process Completed: Yes Hospitalized since last visit: No Patient Requires Transmission-Based Precautions: No Implantable device outside of the clinic excluding No Patient Has Alerts: No cellular tissue based products placed in the center since last visit: Has Dressing in Place as Prescribed: Yes Has Compression in Place as Prescribed: Yes Pain Present Now: No Electronic Signature(s) Signed: 01/29/2023 4:05:21 PM By: Samuella Bruin Entered By: Samuella Bruin on 01/29/2023 09:59:57 -------------------------------------------------------------------------------- Compression Therapy Details Patient Name: Date of Service: Jordan Payor RD, [name] 01/29/2023 10:00 A M Medical Record Number: [id number] Patient Account Number: [id number] Date of Birth/Sex: Treating RN: 1932-07-16 (89 y.o. [name] Primary Care Raylyn Speckman: Alcide Clever Other Clinician: Referring Marshal Schrecengost: Treating Chellsie Gomer/Extender: Duanne Guess [facility], MICHELLE Weeks in  Treatment: 2 Compression Therapy Performed for Wound Assessment: Wound #1 Left,Medial Lower Leg Performed By: Clinician Samuella Bruin, RN Compression Type: Double Layer Post Procedure Diagnosis Same as Pre-procedure Electronic Signature(s) Signed: 01/29/2023 4:05:21 PM By: Samuella Bruin Entered By: Samuella Bruin on 01/29/2023 10:11:57 [name] ([id number]) [id number] Page 2 of 8 -------------------------------------------------------------------------------- Encounter Discharge Information Details Patient Name: Date of Service: [name] 01/29/2023 10:00 A M Medical Record Number: [id number] Patient Account Number: [id number] Date of Birth/Sex: Treating RN: 02-27-1932 (89 y.o. [name] Primary Care Rin Gorton: Alcide Clever Other Clinician: Referring Petros Ahart: Treating Aiysha Jillson/Extender: Duanne Guess [facility], MICHELLE Weeks in Treatment: 2 Encounter Discharge Information Items Post Procedure Vitals Discharge Condition: Stable Temperature (F): 98.8 Ambulatory Status: Walker Pulse (bpm): 93 Discharge Destination: Home Respiratory Rate (breaths/min): 18 Transportation: Private Auto Blood Pressure (mmHg): 151/77 Accompanied By: self Schedule Follow-up Appointment: Yes Clinical Summary of Care: Patient Declined Electronic Signature(s) Signed: 01/29/2023 4:05:21 PM By: Samuella Bruin Entered By: Samuella Bruin on 01/29/2023 11:28:39 -------------------------------------------------------------------------------- Lower Extremity Assessment Details Patient Name: Date of Service: Jordan Payor RD, [name] 01/29/2023 10:00 A M Medical Record Number: [id number] Patient Account Number: [id number] Date of Birth/Sex: Treating RN: Aug 09, 1932 (89 y.o. [name] Primary Care Sumedh Shinsato: Alcide Clever Other Clinician: Referring Bronislaw Switzer: Treating Kruze Atchley/Extender: Duanne Guess [facility],  MICHELLE Weeks in Treatment: 2 Edema Assessment Assessed: [Left: No] [Right: No] Edema: [Left: Ye] [Right: s] Calf Left: Right: Point of Measurement: From Medial Instep 44 cm Ankle Left: Right: Point of Measurement: From Medial Instep 23.8 cm Vascular Assessment Pulses: Dorsalis Pedis Palpable: [Left:Yes] Electronic Signature(s) Signed: 01/29/2023 4:05:21 PM By: Samuella Bruin Entered By: Samuella Bruin on 01/29/2023 10:04:33 Multi Wound Chart Details -------------------------------------------------------------------------------- [name] ([id number]) [id number] Page 3 of 8 Patient Name: Date of Service: Jordan NA [place], [name] 01/29/2023 10:00 A M Medical Record Number: [id number] Patient Account Number: [id number] Date of Birth/Sex: Treating RN: 10-Apr-1932 (89 y.o. M) Primary Care Mandee Pluta: Alcide Clever Other Clinician: Referring Makaveli Hoard: Treating  Natarsha Hurwitz/Extender: Duanne Guess [facility], MICHELLE Weeks in Treatment: 2 Vital Signs Height(in): 72 Pulse(bpm): 93 Weight(lbs): 198 Blood Pressure(mmHg): 151/77 Body Mass Index(BMI): 26.9 Temperature(F): 98.8 Respiratory Rate(breaths/min): 18 Wound Assessments Wound Number: 1 N/A N/A Photos: N/A N/A Left, Medial Lower Leg N/A N/A Wound Location: Gradually Appeared N/A N/A Wounding Event: Venous Leg Ulcer N/A N/A Primary Etiology: Lymphedema N/A N/A Secondary Etiology: Cataracts, Congestive Heart Failure, N/A N/A Comorbid History: Coronary Artery Disease, Hypertension, Peripheral Venous Disease, Osteoarthritis, Neuropathy 05/12/2022 N/A N/A Date Acquired: 2 N/A N/A Weeks of Treatment: Open N/A N/A Wound Status: No N/A N/A Wound Recurrence: 2x0.8x0.1 N/A N/A Measurements L x W x D (cm) 1.257 N/A N/A A (cm) : rea 0.126 N/A N/A Volume (cm) : 82.20% N/A N/A % Reduction in A rea: 82.20% N/A N/A % Reduction in Volume: Full Thickness Without Exposed N/A  N/A Classification: Support Structures Medium N/A N/A Exudate A mount: Serosanguineous N/A N/A Exudate Type: red, brown N/A N/A Exudate Color: Flat and Intact N/A N/A Wound Margin: Large (67-100%) N/A N/A Granulation A mount: Pink N/A N/A Granulation Quality: Small (1-33%) N/A N/A Necrotic A mount: Fat Layer (Subcutaneous Tissue): Yes N/A N/A Exposed Structures: Fascia: No Tendon: No Muscle: No Joint: No Bone: No Small (1-33%) N/A N/A Epithelialization: Debridement - Selective/Open Wound N/A N/A Debridement: Pre-procedure Verification/Time Out 10:09 N/A N/A Taken: Lidocaine 4% Topical Solution N/A N/A Pain Control: Slough N/A N/A Tissue Debrided: Non-Viable Tissue N/A N/A Level: 1.26 N/A N/A Debridement A (sq cm): rea Curette N/A N/A Instrument: Minimum N/A N/A Bleeding: Pressure N/A N/A Hemostasis A chieved: Procedure was tolerated well N/A N/A Debridement Treatment Response: 2x0.8x0.1 N/A N/A Post Debridement Measurements L x W x D (cm) 0.126 N/A N/A Post Debridement Volume: (cm) No Abnormalities Noted N/A N/A Periwound Skin Texture: No Abnormalities Noted N/A N/A Periwound Skin Moisture: Hemosiderin Staining: Yes N/A N/A Periwound Skin Color: No Abnormality N/A N/A Temperature: Compression Therapy N/A N/A Procedures Performed: Debridement Treatment Notes JASHER, KLYM ([name]) [id number] Page 4 of 8 Electronic Signature(s) Signed: 01/29/2023 10:27:47 AM By: Duanne Guess MD FACS Entered By: Duanne Guess on 01/29/2023 10:27:47 -------------------------------------------------------------------------------- Multi-Disciplinary Care Plan Details Patient Name: Date of Service: [facility] NA RD, Lionell E. 01/29/2023 10:00 A M Medical Record Number: [id number] Patient Account Number: [id number] Date of Birth/Sex: Treating RN: 1932-03-22 (89 y.o. [name] Primary Care Shaquandra Galano: Alcide Clever Other  Clinician: Referring Saree Krogh: Treating Danielle Mink/Extender: Duanne Guess [facility], MICHELLE Weeks in Treatment: 2 Multidisciplinary Care Plan reviewed with physician Active Inactive Venous Leg Ulcer Nursing Diagnoses: Knowledge deficit related to disease process and management Potential for venous Insuffiency (use before diagnosis confirmed) Goals: Patient will maintain optimal edema control Date Initiated: 01/11/2023 Target Resolution Date: 02/08/2023 Goal Status: Active Interventions: Assess peripheral edema status every visit. Compression as ordered Provide education on venous insufficiency Treatment Activities: Therapeutic compression applied : 01/11/2023 Notes: Wound/Skin Impairment Nursing Diagnoses: Impaired tissue integrity Knowledge deficit related to ulceration/compromised skin integrity Goals: Patient/caregiver will verbalize understanding of skin care regimen Date Initiated: 01/11/2023 Target Resolution Date: 02/08/2023 Goal Status: Active Ulcer/skin breakdown will have a volume reduction of 30% by week 4 Date Initiated: 01/11/2023 Target Resolution Date: 02/08/2023 Goal Status: Active Interventions: Assess patient/caregiver ability to obtain necessary supplies Assess patient/caregiver ability to perform ulcer/skin care regimen upon admission and as needed Assess ulceration(s) every visit Provide education on ulcer and skin care Treatment Activities: Skin care regimen initiated : 01/11/2023 Topical wound management initiated : 01/11/2023 Notes: Electronic Signature(s) RYELAN, LEVENGOOD ([id number])  [phone].pdf Page 5 of 8 Signed: 01/29/2023 4:05:21 PM By: Samuella Bruin Entered By: Samuella Bruin on 01/29/2023 10:12:07 -------------------------------------------------------------------------------- Pain Assessment Details Patient Name: Date of Service: Jordan Payor RD, Victorio E. 01/29/2023 10:00 A M Medical Record Number:  [id number] Patient Account Number: [id number] Date of Birth/Sex: Treating RN: 1931-10-05 (89 y.o. [name] Primary Care Katoria Yetman: Alcide Clever Other Clinician: Referring Deniesha Stenglein: Treating Vickey Ewbank/Extender: Duanne Guess [facility], MICHELLE Weeks in Treatment: 2 Active Problems Location of Pain Severity and Description of Pain Patient Has Paino No Site Locations Rate the pain. Current Pain Level: 0 Pain Management and Medication Current Pain Management: Electronic Signature(s) Signed: 01/29/2023 4:05:21 PM By: Samuella Bruin Entered By: Samuella Bruin on 01/29/2023 10:00:25 -------------------------------------------------------------------------------- Patient/Caregiver Education Details Patient Name: Date of Service: Jordan Payor RD, [name] 6/18/2024andnbsp10:00 A M Medical Record Number: [id number] Patient Account Number: [id number] Date of Birth/Gender: Treating RN: [birth year] (89 y.o. [name] Primary Care Physician: Alcide Clever Other Clinician: Referring Physician: Treating Physician/Extender: Duanne Guess [facility], MICHELLE Weeks in Treatment: 2 Education Assessment Education Provided To: Patient MATTHER, DOUPE ([id number]) [id number] Page 6 of 8 Education Topics Provided Venous: Methods: Explain/Verbal Responses: Reinforcements needed, State content correctly Electronic Signature(s) Signed: 01/29/2023 4:05:21 PM By: Samuella Bruin Entered By: Samuella Bruin on 01/29/2023 10:12:17 -------------------------------------------------------------------------------- Wound Assessment Details Patient Name: Date of Service: Jordan Payor RD, Willia E. 01/29/2023 10:00 A M Medical Record Number: [id number] Patient Account Number: [id number] Date of Birth/Sex: Treating RN: 1932/03/18 (89 y.o. [name] Primary Care Carleena Mires: Alcide Clever Other Clinician: Referring Julyana Woolverton: Treating  Rajanee Schuelke/Extender: Duanne Guess [facility], MICHELLE Weeks in Treatment: 2 Wound Status Wound Number: 1 Primary Venous Leg Ulcer Etiology: Wound Location: Left, Medial Lower Leg Secondary Lymphedema Wounding Event: Gradually Appeared Etiology: Date Acquired: 05/12/2022 Wound Open Weeks Of Treatment: 2 Status: Clustered Wound: No Comorbid Cataracts, Congestive Heart Failure, Coronary Artery Disease, History: Hypertension, Peripheral Venous Disease, Osteoarthritis, Neuropathy Photos Wound Measurements Length: (cm) 2 Width: (cm) 0.8 Depth: (cm) 0.1 Area: (cm) 1.257 Volume: (cm) 0.126 % Reduction in Area: 82.2% % Reduction in Volume: 82.2% Epithelialization: Small (1-33%) Tunneling: No Undermining: No Wound Description Classification: Full Thickness Without Exposed Support Structures Wound Margin: Flat and Intact Exudate Amount: Medium Exudate Type: Serosanguineous Exudate Color: red, brown Foul Odor After Cleansing: No Slough/Fibrino Yes Wound Bed Granulation Amount: Large (67-100%) Exposed Structure Granulation Quality: Pink Fascia Exposed: No Necrotic Amount: Small (1-33%) Fat Layer (Subcutaneous Tissue) Exposed: Yes Necrotic Quality: Adherent Slough Tendon Exposed: No Muscle Exposed: No Joint Exposed: No Bone Exposed: No ELIYAHU, STANFORTH E ([id number]) [id number] Page 7 of 8 Periwound Skin Texture Texture Color No Abnormalities Noted: Yes No Abnormalities Noted: No Hemosiderin Staining: Yes Moisture No Abnormalities Noted: Yes Temperature / Pain Temperature: No Abnormality Treatment Notes Wound #1 (Lower Leg) Wound Laterality: Left, Medial Cleanser Soap and Water Discharge Instruction: May shower and wash wound with dial antibacterial soap and water prior to dressing change. Wound Cleanser Discharge Instruction: Cleanse the wound with wound cleanser prior to applying a clean dressing using gauze sponges, not tissue or cotton  balls. Peri-Wound Care Sween Lotion (Moisturizing lotion) Discharge Instruction: Apply moisturizing lotion as directed Topical Primary Dressing Maxorb Extra Ag+ Alginate Dressing, 2x2 (in/in) Discharge Instruction: Apply to wound bed as instructed Secondary Dressing Woven Gauze Sponge, Non-Sterile 4x4 in Discharge Instruction: Apply over primary dressing as directed. Secured With Compression Wrap Urgo K2 Lite, (equivalent to a 3 layer) two layer compression system, regular Discharge Instruction: Apply Urgo K2 Lite as directed (alternative to  3 layer compression). Compression Stockings Add-Ons Electronic Signature(s) Signed: 01/29/2023 4:05:21 PM By: Samuella Bruin Entered By: Samuella Bruin on 01/29/2023 10:07:06 -------------------------------------------------------------------------------- Vitals Details Patient Name: Date of Service: Jordan Payor RD, Massimiliano E. 01/29/2023 10:00 A M Medical Record Number: [name] Patient Account Number: [id number] Date of Birth/Sex: Treating RN: Oct 10, 1931 (89 y.o. [name] Primary Care Briah Nary: Alcide Clever Other Clinician: Referring Tanikka Bresnan: Treating Rober Skeels/Extender: Duanne Guess [facility], MICHELLE Weeks in Treatment: 2 Vital Signs Time Taken: 10:00 Temperature (F): 98.8 Height (in): 72 Pulse (bpm): 93 Weight (lbs): 198 Respiratory Rate (breaths/min): 18 Body Mass Index (BMI): 26.9 Blood Pressure (mmHg): 151/77 Reference Range: 80 - 120 mg / dl Electronic Signature(s) Signed: 01/29/2023 4:05:21 PM By: Essie Hart ([id number]) [id number] Page 8 of 8 Entered By: Samuella Bruin on 01/29/2023 10:00:21

## 2023-02-07 ENCOUNTER — Encounter (HOSPITAL_BASED_OUTPATIENT_CLINIC_OR_DEPARTMENT_OTHER): Payer: Medicare Other | Admitting: General Surgery

## 2023-02-07 DIAGNOSIS — L97322 Non-pressure chronic ulcer of left ankle with fat layer exposed: Secondary | ICD-10-CM | POA: Diagnosis not present

## 2023-02-07 NOTE — Progress Notes (Signed)
ELIS, SAUBER ([id number]) [id number] Page 1 of 7 Visit Report for 02/07/2023 Arrival Information Details Patient Name: Date of Service: [name] 02/07/2023 9:00 A M Medical Record Number: [id number] Patient Account Number: [id number] Date of Birth/Sex: Treating RN: 1931-11-22 (89 y.o. Cline Cools Primary Care Abdelrahman Nair: Alcide Clever Other Clinician: Referring Ji Feldner: Treating Jeanine Caven/Extender: Duanne Guess [facility], MICHELLE Weeks in Treatment: 3 Visit Information History Since Last Visit Added or deleted any medications: No Patient Arrived: Dan Humphreys Any new allergies or adverse reactions: No Arrival Time: 09:09 Had a fall or experienced change in No Accompanied By: self activities of daily living that may affect Transfer Assistance: None risk of falls: Patient Identification Verified: Yes Signs or symptoms of abuse/neglect since last visito No Secondary Verification Process Completed: Yes Hospitalized since last visit: No Patient Requires Transmission-Based Precautions: No Implantable device outside of the clinic excluding No Patient Has Alerts: No cellular tissue based products placed in the center since last visit: Has Dressing in Place as Prescribed: Yes Has Compression in Place as Prescribed: Yes Pain Present Now: No Electronic Signature(s) Signed: 02/07/2023 4:07:14 PM By: Redmond Pulling RN, BSN Entered By: Redmond Pulling on 02/07/2023 09:10:01 -------------------------------------------------------------------------------- Compression Therapy Details Patient Name: Date of Service: [name], Jordan E. 02/07/2023 9:00 A M Medical Record Number: [id number] Patient Account Number: [id number] Date of Birth/Sex: Treating RN: 12-16-1931 (89 y.o. Cline Cools Primary Care Keltie Labell: Alcide Clever Other Clinician: Referring Trenae Brunke: Treating Alexxia Stankiewicz/Extender: Duanne Guess [facility], MICHELLE Weeks in Treatment:  3 Compression Therapy Performed for Wound Assessment: Wound #1 Left,Medial Lower Leg Performed By: Clinician Redmond Pulling, RN Compression Type: Three Layer Post Procedure Diagnosis Same as Pre-procedure Electronic Signature(s) Signed: 02/07/2023 4:07:14 PM By: Redmond Pulling RN, BSN Entered By: Redmond Pulling on 02/07/2023 11:06:35 LENNOX, DOLBERRY ([id number]) [id number] Page 2 of 7 -------------------------------------------------------------------------------- Encounter Discharge Information Details Patient Name: Date of Service: [name] 02/07/2023 9:00 A M Medical Record Number: [id number] Patient Account Number: [id number] Date of Birth/Sex: Treating RN: 09-04-1931 (89 y.o. Cline Cools Primary Care Rapheal Masso: Alcide Clever Other Clinician: Referring Tobe Kervin: Treating Tilley Faeth/Extender: Duanne Guess [facility], MICHELLE Weeks in Treatment: 3 Encounter Discharge Information Items Post Procedure Vitals Discharge Condition: Stable Temperature (F): 98.2 Ambulatory Status: Walker Pulse (bpm): 73 Discharge Destination: Home Respiratory Rate (breaths/min): 18 Transportation: Other Blood Pressure (mmHg): 157/69 Accompanied By: self Schedule Follow-up Appointment: Yes Clinical Summary of Care: Patient Declined Electronic Signature(s) Signed: 02/07/2023 4:07:14 PM By: Redmond Pulling RN, BSN Entered By: Redmond Pulling on 02/07/2023 11:07:31 -------------------------------------------------------------------------------- Lower Extremity Assessment Details Patient Name: Date of Service: [name], Jordan E. 02/07/2023 9:00 A M Medical Record Number: [id number] Patient Account Number: [id number] Date of Birth/Sex: Treating RN: 12-28-1931 (89 y.o. Cline Cools Primary Care Shondale Quinley: Alcide Clever Other Clinician: Referring Sopheap Basic: Treating Isaac Dubie/Extender: Duanne Guess [facility], MICHELLE Weeks in Treatment: 3 Edema  Assessment Assessed: [Left: No] [Right: No] Edema: [Left: Ye] [Right: s] Calf Left: Right: Point of Measurement: From Medial Instep 35 cm Ankle Left: Right: Point of Measurement: From Medial Instep 22.4 cm Vascular Assessment Pulses: Dorsalis Pedis Palpable: [Left:Yes] Electronic Signature(s) Signed: 02/07/2023 4:07:14 PM By: Redmond Pulling RN, BSN Entered By: Redmond Pulling on 02/07/2023 09:13:26 Multi Wound Chart Details -------------------------------------------------------------------------------- [name] ([id number]) [id number] Page 3 of 7 Patient Name: Date of Service: [name] 02/07/2023 9:00 A M Medical Record Number: [id number] Patient Account Number: [id number] Date of Birth/Sex: Treating RN: [birth year] (89 y.o. M) Primary Care Elyon Zoll:  SCHMERGE, MICHELLE Other Clinician: Referring Miroslava Santellan: Treating Kailey Esquilin/Extender: Duanne Guess [facility], MICHELLE Weeks in Treatment: 3 Vital Signs Height(in): 72 Pulse(bpm): 73 Weight(lbs): 198 Blood Pressure(mmHg): 157/69 Body Mass Index(BMI): 26.9 Temperature(F): 98.2 Respiratory Rate(breaths/min): 18 Wound Assessments Wound Number: 1 N/A N/A Photos: N/A N/A Left, Medial Lower Leg N/A N/A Wound Location: Gradually Appeared N/A N/A Wounding Event: Venous Leg Ulcer N/A N/A Primary Etiology: Lymphedema N/A N/A Secondary Etiology: Cataracts, Congestive Heart Failure, N/A N/A Comorbid History: Coronary Artery Disease, Hypertension, Peripheral Venous Disease, Osteoarthritis, Neuropathy 05/12/2022 N/A N/A Date Acquired: 3 N/A N/A Weeks of Treatment: Open N/A N/A Wound Status: No N/A N/A Wound Recurrence: 0.2x0.2x0.1 N/A N/A Measurements L x W x D (cm) 0.031 N/A N/A A (cm) : rea 0.003 N/A N/A Volume (cm) : 99.60% N/A N/A % Reduction in Area: 99.60% N/A N/A % Reduction in Volume: Full Thickness Without Exposed N/A N/A Classification: Support Structures Medium  N/A N/A Exudate A mount: Serosanguineous N/A N/A Exudate Type: red, brown N/A N/A Exudate Color: Flat and Intact N/A N/A Wound Margin: Small (1-33%) N/A N/A Granulation Amount: Pink N/A N/A Granulation Quality: Large (67-100%) N/A N/A Necrotic Amount: Eschar, Adherent Slough N/A N/A Necrotic Tissue: Fat Layer (Subcutaneous Tissue): Yes N/A N/A Exposed Structures: Fascia: No Tendon: No Muscle: No Joint: No Bone: No Large (67-100%) N/A N/A Epithelialization: No Abnormalities Noted N/A N/A Periwound Skin Texture: No Abnormalities Noted N/A N/A Periwound Skin Moisture: Hemosiderin Staining: Yes N/A N/A Periwound Skin Color: No Abnormality N/A N/A Temperature: Treatment Notes Electronic Signature(s) Signed: 02/07/2023 9:22:55 AM By: Duanne Guess MD FACS Entered By: Duanne Guess on 02/07/2023 09:22:55 [name] ([id number]) [id number] Page 4 of 7 -------------------------------------------------------------------------------- Multi-Disciplinary Care Plan Details Patient Name: Date of Service: [name] 02/07/2023 9:00 A M Medical Record Number: [id number] Patient Account Number: [id number] Date of Birth/Sex: Treating RN: 10-24-1931 (89 y.o. Cline Cools Primary Care Colie Fugitt: Alcide Clever Other Clinician: Referring Yolette Hastings: Treating Andres Bantz/Extender: Duanne Guess [facility], MICHELLE Weeks in Treatment: 3 Multidisciplinary Care Plan reviewed with physician Active Inactive Venous Leg Ulcer Nursing Diagnoses: Knowledge deficit related to disease process and management Potential for venous Insuffiency (use before diagnosis confirmed) Goals: Patient will maintain optimal edema control Date Initiated: 01/11/2023 Target Resolution Date: 02/08/2023 Goal Status: Active Interventions: Assess peripheral edema status every visit. Compression as ordered Provide education on venous insufficiency Treatment  Activities: Therapeutic compression applied : 01/11/2023 Notes: Wound/Skin Impairment Nursing Diagnoses: Impaired tissue integrity Knowledge deficit related to ulceration/compromised skin integrity Goals: Patient/caregiver will verbalize understanding of skin care regimen Date Initiated: 01/11/2023 Target Resolution Date: 02/08/2023 Goal Status: Active Ulcer/skin breakdown will have a volume reduction of 30% by week 4 Date Initiated: 01/11/2023 Date Inactivated: 02/07/2023 Target Resolution Date: 02/08/2023 Goal Status: Met Interventions: Assess patient/caregiver ability to obtain necessary supplies Assess patient/caregiver ability to perform ulcer/skin care regimen upon admission and as needed Assess ulceration(s) every visit Provide education on ulcer and skin care Treatment Activities: Skin care regimen initiated : 01/11/2023 Topical wound management initiated : 01/11/2023 Notes: Electronic Signature(s) Signed: 02/07/2023 4:07:14 PM By: Redmond Pulling RN, BSN Entered By: Redmond Pulling on 02/07/2023 09:18:47 Pain Assessment Details -------------------------------------------------------------------------------- [name] ([id number]) [id number] Page 5 of 7 Patient Name: Date of Service: [name] 02/07/2023 9:00 A M Medical Record Number: [id number] Patient Account Number: [id number] Date of Birth/Sex: Treating RN: [birth year] (89 y.o. Cline Cools Primary Care Tyresha Fede: Alcide Clever Other Clinician: Referring Nyella Eckels: Treating Aditya Nastasi/Extender: Duanne Guess [facility] [name], MICHELLE Weeks in  Treatment: 3 Active Problems Location of Pain Severity and Description of Pain Patient Has Paino No Site Locations Pain Management and Medication Current Pain Management: Electronic Signature(s) Signed: 02/07/2023 4:07:14 PM By: Redmond Pulling RN, BSN Entered By: Redmond Pulling on 02/07/2023  09:10:29 -------------------------------------------------------------------------------- Patient/Caregiver Education Details Patient Name: Date of Service: [name], [name] 6/27/2024andnbsp9:00 A M Medical Record Number: [id number] Patient Account Number: [id number] Date of Birth/Gender: Treating RN: 1932-08-01 (89 y.o. Cline Cools Primary Care Physician: Alcide Clever Other Clinician: Referring Physician: Treating Physician/Extender: Duanne Guess [facility], MICHELLE Weeks in Treatment: 3 Education Assessment Education Provided To: Patient Education Topics Provided Wound/Skin Impairment: Methods: Explain/Verbal Responses: State content correctly Electronic Signature(s) Signed: 02/07/2023 4:07:14 PM By: Redmond Pulling RN, BSN Entered By: Redmond Pulling on 02/07/2023 09:19:03 [name], [name] ([id number]) [id number] Page 6 of 7 -------------------------------------------------------------------------------- Wound Assessment Details Patient Name: Date of Service: [name] 02/07/2023 9:00 A M Medical Record Number: [id number] Patient Account Number: [id number] Date of Birth/Sex: Treating RN: 10-25-1931 (89 y.o. Cline Cools Primary Care Isaish Alemu: Alcide Clever Other Clinician: Referring Tenesia Escudero: Treating Cornelio Parkerson/Extender: Duanne Guess [facility], MICHELLE Weeks in Treatment: 3 Wound Status Wound Number: 1 Primary Venous Leg Ulcer Etiology: Wound Location: Left, Medial Lower Leg Secondary Lymphedema Wounding Event: Gradually Appeared Etiology: Date Acquired: 05/12/2022 Wound Open Weeks Of Treatment: 3 Status: Clustered Wound: No Comorbid Cataracts, Congestive Heart Failure, Coronary Artery Disease, History: Hypertension, Peripheral Venous Disease, Osteoarthritis, Neuropathy Photos Wound Measurements Length: (cm) 0.2 Width: (cm) 0.2 Depth: (cm) 0.1 Area: (cm) 0. Volume: (cm) 0. % Reduction in Area:  99.6% % Reduction in Volume: 99.6% Epithelialization: Large (67-100%) 031 Tunneling: No 003 Undermining: No Wound Description Classification: Full Thickness Without Exposed Suppor Wound Margin: Flat and Intact Exudate Amount: Medium Exudate Type: Serosanguineous Exudate Color: red, brown t Structures Foul Odor After Cleansing: No Slough/Fibrino Yes Wound Bed Granulation Amount: Small (1-33%) Exposed Structure Granulation Quality: Pink Fascia Exposed: No Necrotic Amount: Large (67-100%) Fat Layer (Subcutaneous Tissue) Exposed: Yes Necrotic Quality: Eschar, Adherent Slough Tendon Exposed: No Muscle Exposed: No Joint Exposed: No Bone Exposed: No Periwound Skin Texture Texture Color No Abnormalities Noted: Yes No Abnormalities Noted: No Hemosiderin Staining: Yes Moisture No Abnormalities Noted: Yes Temperature / Pain Temperature: No Abnormality Treatment Notes Wound #1 (Lower Leg) Wound Laterality: Left, Medial [name], Jordan E ([id number]) [id number] Page 7 of 7 Cleanser Soap and Water Discharge Instruction: May shower and wash wound with dial antibacterial soap and water prior to dressing change. Wound Cleanser Discharge Instruction: Cleanse the wound with wound cleanser prior to applying a clean dressing using gauze sponges, not tissue or cotton balls. Peri-Wound Care Sween Lotion (Moisturizing lotion) Discharge Instruction: Apply moisturizing lotion as directed Topical Primary Dressing Maxorb Extra Ag+ Alginate Dressing, 2x2 (in/in) Discharge Instruction: Apply to wound bed as instructed Secondary Dressing Woven Gauze Sponge, Non-Sterile 4x4 in Discharge Instruction: Apply over primary dressing as directed. Secured With Compression Wrap Urgo K2 Lite, (equivalent to a 3 layer) two layer compression system, regular Discharge Instruction: Apply Urgo K2 Lite as directed (alternative to 3 layer compression). Compression  Stockings Add-Ons Electronic Signature(s) Signed: 02/07/2023 4:07:14 PM By: Redmond Pulling RN, BSN Entered By: Redmond Pulling on 02/07/2023 09:17:44 -------------------------------------------------------------------------------- Vitals Details Patient Name: Date of Service: [name], Jordan E. 02/07/2023 9:00 A M Medical Record Number: [id number] Patient Account Number: [id number] Date of Birth/Sex: Treating RN: 19-Jan-1932 (89 y.o. Cline Cools Primary Care Shante Archambeault: Alcide Clever Other Clinician: Referring Renate Danh: Treating Chandel Zaun/Extender: Duanne Guess  SCHMERGE, MICHELLE Weeks in Treatment: 3 Vital Signs Time Taken: 09:10 Temperature (F): 98.2 Height (in): 72 Pulse (bpm): 73 Weight (lbs): 198 Respiratory Rate (breaths/min): 18 Body Mass Index (BMI): 26.9 Blood Pressure (mmHg): 157/69 Reference Range: 80 - 120 mg / dl Electronic Signature(s) Signed: 02/07/2023 4:07:14 PM By: Redmond Pulling RN, BSN Entered By: Redmond Pulling on 02/07/2023 09:10:22

## 2023-02-07 NOTE — Progress Notes (Addendum)
[name] ([id number]) [id number] Page 1 of 10 Visit Report for 02/07/2023 Chief Complaint Document Details Patient Name: Date of Service: [place] 02/07/2023 9:00 A M Medical Record Number: [id number] Patient Account Number: [id number] Date of Birth/Sex: Treating RN: 1932-06-04 (88 y.o. M) Primary Care Provider: Alcide Clever Other Clinician: Referring Provider: Treating Provider/Extender: Duanne Guess [facility], MICHELLE Weeks in Treatment: 3 Information Obtained from: Patient Chief Complaint Patient presents for treatment of an open ulcer due to venous insufficiency Electronic Signature(s) Signed: 02/07/2023 9:33:10 AM By: Duanne Guess MD FACS Entered By: Duanne Guess on 02/07/2023 09:33:09 -------------------------------------------------------------------------------- Debridement Details Patient Name: Date of Service: Jordan Payor RD, [name] 02/07/2023 9:00 A M Medical Record Number: [id number] Patient Account Number: [id number] Date of Birth/Sex: Treating RN: 11-25-1931 (89 y.o. Cline Cools Primary Care Provider: Alcide Clever Other Clinician: Referring Provider: Treating Provider/Extender: Duanne Guess [facility], MICHELLE Weeks in Treatment: 3 Debridement Performed for Assessment: Wound #1 Left,Medial Lower Leg Performed By: Physician Duanne Guess, MD Debridement Type: Debridement Severity of Tissue Pre Debridement: Fat layer exposed Level of Consciousness (Pre-procedure): Awake and Alert Pre-procedure Verification/Time Out Yes - 09:20 Taken: Start Time: 09:25 Pain Control: Lidocaine 4% T opical Solution Percent of Wound Bed Debrided: 100% T Area Debrided (cm): otal 0.03 Tissue and other material debrided: Non-Viable, Slough, Slough Level: Non-Viable Tissue Debridement Description: Selective/Open Wound Instrument: Curette Bleeding: Minimum Hemostasis Achieved: Pressure Response to Treatment:  Procedure was tolerated well Level of Consciousness (Post- Awake and Alert procedure): Post Debridement Measurements of Total Wound Length: (cm) 0.2 Width: (cm) 0.2 Depth: (cm) 0.1 Volume: (cm) 0.003 Character of Wound/Ulcer Post Debridement: Improved Severity of Tissue Post Debridement: Fat layer exposed Post Procedure Diagnosis TAIT, LEGARDA E ([id number]) [id number] Page 2 of 10 Same as Pre-procedure Notes Scribed for Dr Lady Gary by Redmond Pulling, RN Electronic Signature(s) Signed: 02/07/2023 9:52:42 AM By: Duanne Guess MD FACS Signed: 02/07/2023 4:07:14 PM By: Redmond Pulling RN, BSN Entered By: Redmond Pulling on 02/07/2023 09:26:06 -------------------------------------------------------------------------------- HPI Details Patient Name: Date of Service: Jordan NA RD, [name] 02/07/2023 9:00 A M Medical Record Number: [id number] Patient Account Number: [id number] Date of Birth/Sex: Treating RN: Jan 01, 1932 (89 y.o. M) Primary Care Provider: Alcide Clever Other Clinician: Referring Provider: Treating Provider/Extender: Duanne Guess [facility], MICHELLE Weeks in Treatment: 3 History of Present Illness HPI Description: ADMISSION 01/11/2023 This is an 89 year old nondiabetic with a history of chronic venous insufficiency, coronary artery disease, congestive heart failure, and lower extremity edema. He has historically been followed by podiatry for various ulcers and calluses on his feet. When he was seen in the middle of April, he was noted to have an ulcer starting on his left ankle. He was prescribed a course of cephalexin and advised to apply Betadine to the wound with an Ace wrap. He was referred to the wound care center for further evaluation and management. Apparently the wound closed of its own accord prior to his appointment with [place], but subsequently reopened. The notes from podiatry do indicate that compression and elevation were discussed as  means to help heal ulceration and avoid future episodes. After the wound reopened, he was seen again and prescribed a course of Augmentin. He is now here for his wound care appointment. 01/21/2023: The wound is smaller and more superficial. It is fairly clean with just a little bit of light slough on the surface. His venous reflux studies are scheduled for this coming Thursday. 01/29/2023: The wound is smaller again today and very superficial. There is  minimal slough present with a little eschar around the edges. His venous reflux studies were completed, and as expected, he has severe venous reflux. 02/07/2023: His wound is very nearly closed. He has an appointment coming up with vascular surgery on July 8. He indicated a callus on the bottom of his left foot that is bothering him today. Electronic Signature(s) Signed: 02/07/2023 9:33:52 AM By: Duanne Guess MD FACS Entered By: Duanne Guess on 02/07/2023 09:33:52 -------------------------------------------------------------------------------- Paring/cutting 1 benign hyperkeratotic lesion Details Patient Name: Date of Service: [address] [place] 02/07/2023 9:00 A M Medical Record Number: [id number] Patient Account Number: [id number] Date of Birth/Sex: Treating RN: October 13, 1931 (89 y.o. Cline Cools Primary Care Provider: Alcide Clever Other Clinician: Referring Provider: Treating Provider/Extender: Duanne Guess [facility], MICHELLE Weeks in Treatment: 3 Procedure Performed for: NonWound Condition Other Dermatologic Condition - Left Foot Performed By: Physician Duanne Guess, MD Post Procedure Diagnosis Same as Pre-procedure [name] ([id number]) [id number] Page 3 of 10 Notes callus pairing of left planter foot Electronic Signature(s) Signed: 02/07/2023 9:52:42 AM By: Duanne Guess MD FACS Signed: 02/07/2023 4:07:14 PM By: Redmond Pulling RN, BSN Entered By: Redmond Pulling on 02/07/2023  09:28:52 -------------------------------------------------------------------------------- Physical Exam Details Patient Name: Date of Service: Jordan NA RD, Orenthal E. 02/07/2023 9:00 A M Medical Record Number: [id number] Patient Account Number: [id number] Date of Birth/Sex: Treating RN: [birth year] (89 y.o. M) Primary Care Provider: Alcide Clever Other Clinician: Referring Provider: Treating Provider/Extender: Duanne Guess [facility], MICHELLE Weeks in Treatment: 3 Constitutional Hypertensive, asymptomatic. . . . no acute distress. Respiratory Normal work of breathing on room air. Notes 02/07/2023: His wound is very nearly closed. He indicated a callus on the bottom of his left foot that is bothering him today. Electronic Signature(s) Signed: 02/07/2023 9:34:29 AM By: Duanne Guess MD FACS Entered By: Duanne Guess on 02/07/2023 09:34:29 -------------------------------------------------------------------------------- Physician Orders Details Patient Name: Date of Service: Jordan Payor RD, Braelen E. 02/07/2023 9:00 A M Medical Record Number: [id number] Patient Account Number: [id number] Date of Birth/Sex: Treating RN: May 17, 1932 (89 y.o. Cline Cools Primary Care Provider: Alcide Clever Other Clinician: Referring Provider: Treating Provider/Extender: Duanne Guess [facility], MICHELLE Weeks in Treatment: 3 Verbal / Phone Orders: No Diagnosis Coding Follow-up Appointments ppointment in 1 week. - Dr. Lady Gary RM 1 Return A 11:30 on Friday 02/15/23 Anesthetic Wound #1 Left,Medial Lower Leg (In clinic) Topical Lidocaine 4% applied to wound bed Bathing/ Shower/ Hygiene May shower with protection but do not get wound dressing(s) wet. Protect dressing(s) with water repellant cover (for example, large plastic bag) or a cast cover and may then take shower. Edema Control - Lymphedema / SCD / Other Elevate legs to the level of the heart or above for 30 minutes daily and/or when  sitting for 3-4 times a day throughout the day. - throughout the day Avoid standing for long periods of time. Exercise regularly MILBURN, HUSEBY ([id number]) [phone] Page 4 of 10 Compression stocking or Garment 20-30 mm/Hg pressure to: - right leg daily Wound Treatment Wound #1 - Lower Leg Wound Laterality: Left, Medial Cleanser: Soap and Water 1 x Per Week/30 Days Discharge Instructions: May shower and wash wound with dial antibacterial soap and water prior to dressing change. Cleanser: Wound Cleanser 1 x Per Week/30 Days Discharge Instructions: Cleanse the wound with wound cleanser prior to applying a clean dressing using gauze sponges, not tissue or cotton balls. Peri-Wound Care: Sween Lotion (Moisturizing lotion) 1 x Per Week/30 Days Discharge Instructions: Apply moisturizing lotion as directed Prim Dressing:  Maxorb Extra Ag+ Alginate Dressing, 2x2 (in/in) 1 x Per Week/30 Days ary Discharge Instructions: Apply to wound bed as instructed Secondary Dressing: Woven Gauze Sponge, Non-Sterile 4x4 in 1 x Per Week/30 Days Discharge Instructions: Apply over primary dressing as directed. Compression Wrap: Urgo K2 Lite, (equivalent to a 3 layer) two layer compression system, regular 1 x Per Week/30 Days Discharge Instructions: Apply Urgo K2 Lite as directed (alternative to 3 layer compression). Patient Medications llergies: amlodipine, Lyrica, gabapentin A Notifications Medication Indication Start End 02/07/2023 lidocaine DOSE topical 4 % cream - cream topical once daily Electronic Signature(s) Signed: 02/07/2023 9:52:42 AM By: Duanne Guess MD FACS Entered By: Duanne Guess on 02/07/2023 09:34:46 -------------------------------------------------------------------------------- Problem List Details Patient Name: Date of Service: Jordan Payor RD, Joeangel E. 02/07/2023 9:00 A M Medical Record Number: [id number] Patient Account Number: [id number] Date of Birth/Sex:  Treating RN: June 06, 1932 (89 y.o. M) Primary Care Provider: Alcide Clever Other Clinician: Referring Provider: Treating Provider/Extender: Duanne Guess [facility], MICHELLE Weeks in Treatment: 3 Active Problems ICD-10 Encounter Code Description Active Date MDM Diagnosis L97.322 Non-pressure chronic ulcer of left ankle with fat layer exposed 01/11/2023 No Yes I87.2 Venous insufficiency (chronic) (peripheral) 01/11/2023 No Yes I50.9 Heart failure, unspecified 01/11/2023 No Yes R60.0 Localized edema 01/11/2023 No Yes AVANISH, WIEMKEN E ([id number]) [id number] Page 5 of 10 I25.10 Atherosclerotic heart disease of native coronary artery without angina pectoris 01/11/2023 No Yes L84 Corns and callosities 02/07/2023 No Yes Inactive Problems Resolved Problems Electronic Signature(s) Signed: 02/07/2023 9:22:17 AM By: Duanne Guess MD FACS Previous Signature: 02/07/2023 9:21:14 AM Version By: Duanne Guess MD FACS Entered By: Duanne Guess on 02/07/2023 09:22:17 -------------------------------------------------------------------------------- Progress Note Details Patient Name: Date of Service: Jordan NA RD, Clancy E. 02/07/2023 9:00 A M Medical Record Number: [id number] Patient Account Number: [id number] Date of Birth/Sex: Treating RN: [birth year] (89 y.o. M) Primary Care Provider: Alcide Clever Other Clinician: Referring Provider: Treating Provider/Extender: Duanne Guess [facility], MICHELLE Weeks in Treatment: 3 Subjective Chief Complaint Information obtained from Patient Patient presents for treatment of an open ulcer due to venous insufficiency History of Present Illness (HPI) ADMISSION 01/11/2023 This is an 89 year old nondiabetic with a history of chronic venous insufficiency, coronary artery disease, congestive heart failure, and lower extremity edema. He has historically been followed by podiatry for various ulcers and calluses on his feet. When  he was seen in the middle of April, he was noted to have an ulcer starting on his left ankle. He was prescribed a course of cephalexin and advised to apply Betadine to the wound with an Ace wrap. He was referred to the wound care center for further evaluation and management. Apparently the wound closed of its own accord prior to his appointment with [place], but subsequently reopened. The notes from podiatry do indicate that compression and elevation were discussed as means to help heal ulceration and avoid future episodes. After the wound reopened, he was seen again and prescribed a course of Augmentin. He is now here for his wound care appointment. 01/21/2023: The wound is smaller and more superficial. It is fairly clean with just a little bit of light slough on the surface. His venous reflux studies are scheduled for this coming Thursday. 01/29/2023: The wound is smaller again today and very superficial. There is minimal slough present with a little eschar around the edges. His venous reflux studies were completed, and as expected, he has severe venous reflux. 02/07/2023: His wound is very nearly closed. He has an appointment coming up with vascular surgery on July  8. He indicated a callus on the bottom of his left foot that is bothering him today. Patient History Information obtained from Patient, Chart. Family History Lung Disease - Siblings, Stroke - Siblings, No family history of Cancer, Diabetes, Heart Disease, Hereditary Spherocytosis, Hypertension, Kidney Disease, Seizures, Thyroid Problems, Tuberculosis. Social History Former smoker, Marital Status - Married, Alcohol Use - Moderate, Drug Use - No History, Caffeine Use - Rarely. Medical History Eyes Patient has history of Cataracts - bil extractions Denies history of Glaucoma, Optic Neuritis Ear/Nose/Mouth/Throat Denies history of Chronic sinus problems/congestion, Middle ear problems Cardiovascular Patient has history of Congestive Heart  Failure, Coronary Artery Disease, Hypertension, Peripheral Venous Disease Endocrine Denies history of Type I Diabetes, Type II Diabetes Integumentary (Skin) FAZAL, SANGSTER E ([id number]) [id number] Page 6 of 10 Denies history of History of Burn Musculoskeletal Patient has history of Osteoarthritis Neurologic Patient has history of Neuropathy Oncologic Denies history of Received Chemotherapy, Received Radiation Psychiatric Denies history of Anorexia/bulimia, Confinement Anxiety Hospitalization/Surgery History - bil carpal tunnel release. - cardiac cath. - kyphoplasty. - ORIF patella left. - left total knee arthroplasty. - posterior lumbar fusion. - lumbar laminectomy. - posterior cervical fusion. - appendectomy. - blepharoplasty. - cholecystectomy. - bil catract extraction. - right knee replacement. - melanoma excision. - rectal surgery fissure. - vitrectomy. Medical A Surgical History Notes nd Ear/Nose/Mouth/Throat seasonal allergies Musculoskeletal myositis, myalgia, left patella fracture, bursitis of hips, lumbar compression fx, spondylolistthesis Neurologic bil carpal tunnel Oncologic skin cancers Objective Constitutional Hypertensive, asymptomatic. no acute distress. Vitals Time Taken: 9:10 AM, Height: 72 in, Weight: 198 lbs, BMI: 26.9, Temperature: 98.2 F, Pulse: 73 bpm, Respiratory Rate: 18 breaths/min, Blood Pressure: 157/69 mmHg. Respiratory Normal work of breathing on room air. General Notes: 02/07/2023: His wound is very nearly closed. He indicated a callus on the bottom of his left foot that is bothering him today. Integumentary (Hair, Skin) Wound #1 status is Open. Original cause of wound was Gradually Appeared. The date acquired was: 05/12/2022. The wound has been in treatment 3 weeks. The wound is located on the Left,Medial Lower Leg. The wound measures 0.2cm length x 0.2cm width x 0.1cm depth; 0.031cm^2 area and 0.003cm^3 volume. There is  Fat Layer (Subcutaneous Tissue) exposed. There is no tunneling or undermining noted. There is a medium amount of serosanguineous drainage noted. The wound margin is flat and intact. There is small (1-33%) pink granulation within the wound bed. There is a large (67-100%) amount of necrotic tissue within the wound bed including Eschar and Adherent Slough. The periwound skin appearance had no abnormalities noted for texture. The periwound skin appearance had no abnormalities noted for moisture. The periwound skin appearance exhibited: Hemosiderin Staining. Periwound temperature was noted as No Abnormality. Assessment Active Problems ICD-10 Non-pressure chronic ulcer of left ankle with fat layer exposed Venous insufficiency (chronic) (peripheral) Heart failure, unspecified Localized edema Atherosclerotic heart disease of native coronary artery without angina pectoris Corns and callosities Procedures Wound #1 Pre-procedure diagnosis of Wound #1 is a Venous Leg Ulcer located on the Left,Medial Lower Leg .Severity of Tissue Pre Debridement is: Fat layer exposed. There was a Selective/Open Wound Non-Viable Tissue Debridement with a total area of 0.03 sq cm performed by Duanne Guess, MD. With the following instrument(s): Curette to remove Non-Viable tissue/material. Material removed includes [facility] after achieving pain control using Lidocaine 4% Topical Solution. No specimens were taken. A time out was conducted at 09:20, prior to the start of the procedure. A Minimum amount of bleeding was controlled with Pressure. The  procedure was tolerated well. Post Debridement Measurements: 0.2cm length x 0.2cm width x 0.1cm depth; 0.003cm^3 volume. Character of Wound/Ulcer Post Debridement is improved. Severity of Tissue Post Debridement is: Fat layer exposed. Post procedure Diagnosis Wound #1: Same as Pre-Procedure General Notes: Scribed for Dr Lady Gary by Redmond Pulling, RN. HARLIS, CAFFEE ([id number])  [id number] Page 7 of 10 Pre-procedure diagnosis of Wound #1 is a Venous Leg Ulcer located on the Left,Medial Lower Leg . There was a Three Layer Compression Therapy Procedure by Redmond Pulling, RN. Post procedure Diagnosis Wound #1: Same as Pre-Procedure A Paring/cutting 1 benign hyperkeratotic lesion procedure was performed. by Duanne Guess, MD. Post procedure Diagnosis Wound #: Same as Pre-Procedure Notes: callus pairing of left planter foot Plan Follow-up Appointments: Return Appointment in 1 week. - Dr. Lady Gary RM 1 11:30 on Friday 02/15/23 Anesthetic: Wound #1 Left,Medial Lower Leg: (In clinic) Topical Lidocaine 4% applied to wound bed Bathing/ Shower/ Hygiene: May shower with protection but do not get wound dressing(s) wet. Protect dressing(s) with water repellant cover (for example, large plastic bag) or a cast cover and may then take shower. Edema Control - Lymphedema / SCD / Other: Elevate legs to the level of the heart or above for 30 minutes daily and/or when sitting for 3-4 times a day throughout the day. - throughout the day Avoid standing for long periods of time. Exercise regularly Compression stocking or Garment 20-30 mm/Hg pressure to: - right leg daily The following medication(s) was prescribed: lidocaine topical 4 % cream cream topical once daily was prescribed at facility WOUND #1: - Lower Leg Wound Laterality: Left, Medial Cleanser: Soap and Water 1 x Per Week/30 Days Discharge Instructions: May shower and wash wound with dial antibacterial soap and water prior to dressing change. Cleanser: Wound Cleanser 1 x Per Week/30 Days Discharge Instructions: Cleanse the wound with wound cleanser prior to applying a clean dressing using gauze sponges, not tissue or cotton balls. Peri-Wound Care: Sween Lotion (Moisturizing lotion) 1 x Per Week/30 Days Discharge Instructions: Apply moisturizing lotion as directed Prim Dressing: Maxorb Extra Ag+  Alginate Dressing, 2x2 (in/in) 1 x Per Week/30 Days ary Discharge Instructions: Apply to wound bed as instructed Secondary Dressing: Woven Gauze Sponge, Non-Sterile 4x4 in 1 x Per Week/30 Days Discharge Instructions: Apply over primary dressing as directed. Com pression Wrap: Urgo K2 Lite, (equivalent to a 3 layer) two layer compression system, regular 1 x Per Week/30 Days Discharge Instructions: Apply Urgo K2 Lite as directed (alternative to 3 layer compression). 02/07/2023: His wound is very nearly closed. He indicated a callus on the bottom of his left foot that is bothering him today. I used a curette to debride eschar from his ankle wound. I then pared the callus on his foot with a curette to the point of comfort. We will continue silver alginate and 3 layer compression equivalent. I anticipate he will be completely healed at his visit next week. Electronic Signature(s) Signed: 02/08/2023 2:14:44 PM By: Shawn Stall RN, BSN Signed: 02/08/2023 2:47:25 PM By: Duanne Guess MD FACS Previous Signature: 02/07/2023 9:36:28 AM Version By: Duanne Guess MD FACS Entered By: Shawn Stall on 02/08/2023 14:09:46 -------------------------------------------------------------------------------- HxROS Details Patient Name: Date of Service: Jordan NA RD, Riven E. 02/07/2023 9:00 A M Medical Record Number: [id number] Patient Account Number: [id number] Date of Birth/Sex: Treating RN: 28-Sep-1931 (89 y.o. M) Primary Care Provider: Alcide Clever Other Clinician: Referring Provider: Treating Provider/Extender: Duanne Guess [facility], MICHELLE Weeks in Treatment: 3 Information Obtained From Patient Chart Eyes Medical  History: Positive for: Cataracts - bil extractions DEUNTAY, FRIESEN ([id number]) [id number] Page 8 of 10 Negative for: Glaucoma; Optic Neuritis Ear/Nose/Mouth/Throat Medical History: Negative for: Chronic sinus problems/congestion; Middle ear  problems Past Medical History Notes: seasonal allergies Cardiovascular Medical History: Positive for: Congestive Heart Failure; Coronary Artery Disease; Hypertension; Peripheral Venous Disease Endocrine Medical History: Negative for: Type I Diabetes; Type II Diabetes Integumentary (Skin) Medical History: Negative for: History of Burn Musculoskeletal Medical History: Positive for: Osteoarthritis Past Medical History Notes: myositis, myalgia, left patella fracture, bursitis of hips, lumbar compression fx, spondylolistthesis Neurologic Medical History: Positive for: Neuropathy Past Medical History Notes: bil carpal tunnel Oncologic Medical History: Negative for: Received Chemotherapy; Received Radiation Past Medical History Notes: skin cancers Psychiatric Medical History: Negative for: Anorexia/bulimia; Confinement Anxiety HBO Extended History Items Eyes: Cataracts Immunizations Pneumococcal Vaccine: Received Pneumococcal Vaccination: No Implantable Devices No devices added Hospitalization / Surgery History Type of Hospitalization/Surgery bil carpal tunnel release cardiac cath kyphoplasty ORIF patella left left total knee arthroplasty posterior lumbar fusion lumbar laminectomy posterior cervical fusion appendectomy blepharoplasty cholecystectomy bil catract extraction right knee replacement melanoma excision JERRE, MORENZ E ([id number]) [id number] Page 9 of 10 rectal surgery fissure vitrectomy Family and Social History Cancer: No; Diabetes: No; Heart Disease: No; Hereditary Spherocytosis: No; Hypertension: No; Kidney Disease: No; Lung Disease: Yes - Siblings; Seizures: No; Stroke: Yes - Siblings; Thyroid Problems: No; Tuberculosis: No; Former smoker; Marital Status - Married; Alcohol Use: Moderate; Drug Use: No History; Caffeine Use: Rarely; Financial Concerns: No; Food, Clothing or Shelter Needs: No; Support System Lacking: No;  Transportation Concerns: No Electronic Signature(s) Signed: 02/07/2023 9:52:42 AM By: Duanne Guess MD FACS Entered By: Duanne Guess on 02/07/2023 09:34:00 -------------------------------------------------------------------------------- SuperBill Details Patient Name: Date of Service: Jordan Payor RD, Johnwilliam E. 02/07/2023 Medical Record Number: [id number] Patient Account Number: [id number] Date of Birth/Sex: Treating RN: 1931/11/30 (89 y.o. M) Primary Care Provider: Alcide Clever Other Clinician: Referring Provider: Treating Provider/Extender: Duanne Guess [facility], MICHELLE Weeks in Treatment: 3 Diagnosis Coding ICD-10 Codes Code Description [phone] Non-pressure chronic ulcer of left ankle with fat layer exposed I87.2 Venous insufficiency (chronic) (peripheral) I50.9 Heart failure, unspecified R60.0 Localized edema I25.10 Atherosclerotic heart disease of native coronary artery without angina pectoris L84 Corns and callosities Facility Procedures : CPT4 Code: [id number] Description: 97597 - DEBRIDE WOUND 1ST 20 SQ CM OR < ICD-10 Diagnosis Description L97.322 Non-pressure chronic ulcer of left ankle with fat layer exposed Modifier: Quantity: 1 : CPT4 Code: [id number] Description: 11055 - PARE BENIGN LES; SGL ICD-10 Diagnosis Description L84 Corns and callosities Modifier: Quantity: 1 Physician Procedures : CPT4 Code Description Modifier [id number] - WC PHYS LEVEL 3 - EST PT 25 ICD-10 Diagnosis Description L97.322 Non-pressure chronic ulcer of left ankle with fat layer exposed I87.2 Venous insufficiency (chronic) (peripheral) R60.0 Localized edema L84  Corns and callosities Quantity: 1 : [id number] 97597 - WC PHYS DEBR WO ANESTH 20 SQ CM ICD-10 Diagnosis Description L97.322 Non-pressure chronic ulcer of left ankle with fat layer exposed Quantity: 1 Electronic Signature(s) Signed: 02/07/2023 9:36:49 AM By: Duanne Guess MD FACS Entered By: Duanne Guess on 02/07/2023  09:36:49

## 2023-02-11 NOTE — Progress Notes (Signed)
[name], [name] ([id number]) [id number] Page 1 of 10 Visit Report for 01/11/2023 Chief Complaint Document Details Patient Name: Date of Service: [name] 01/11/2023 9:00 A M Medical Record Number: [id number] Patient Account Number: [id number] Date of Birth/Sex: Treating RN: [birth year] (89 y.o. M) Primary Care Provider: Alcide Clever Other Clinician: Referring Provider: Treating Provider/Extender: Duanne Guess [facility], MICHELLE Weeks in Treatment: 0 Information Obtained from: Patient Chief Complaint Patient presents for treatment of an open ulcer due to venous insufficiency Electronic Signature(s) Signed: 01/11/2023 10:32:41 AM By: Duanne Guess MD FACS Previous Signature: 01/11/2023 9:14:06 AM Version By: Duanne Guess MD FACS Entered By: Duanne Guess on 01/11/2023 10:32:41 -------------------------------------------------------------------------------- Debridement Details Patient Name: Date of Service: [name], [name] 01/11/2023 9:00 A M Medical Record Number: [id number] Patient Account Number: [id number] Date of Birth/Sex: Treating RN: March 01, 1932 (89 y.o. Damaris Schooner Primary Care Provider: Alcide Clever Other Clinician: Referring Provider: Treating Provider/Extender: Duanne Guess [facility] MICHELLE Weeks in Treatment: 0 Debridement Performed for Assessment: Wound #1 Left,Medial Lower Leg Performed By: Physician Duanne Guess, MD Debridement Type: Debridement Severity of Tissue Pre Debridement: Fat layer exposed Level of Consciousness (Pre-procedure): Awake and Alert Pre-procedure Verification/Time Out Yes - 10:05 Taken: Start Time: 10:06 Pain Control: Lidocaine 4% T opical Solution Percent of Wound Bed Debrided: 100% T Area Debrided (cm): otal 7.06 Tissue and other material debrided: Non-Viable, Slough, Slough Level: Non-Viable Tissue Debridement Description: Selective/Open Wound Instrument:  Curette Bleeding: Minimum Hemostasis Achieved: Pressure Procedural Pain: 4 Post Procedural Pain: 1 Response to Treatment: Procedure was tolerated well Level of Consciousness (Post- Awake and Alert procedure): Post Debridement Measurements of Total Wound Length: (cm) 5 Width: (cm) 1.8 Depth: (cm) 0.1 Volume: (cm) 0.707 Character of Wound/Ulcer Post Debridement: Improved [name], [name] ([id number]) [id number] Page 2 of 10 Severity of Tissue Post Debridement: Fat layer exposed Post Procedure Diagnosis Same as Pre-procedure Notes scribed for Dr. Lady Gary by Zenaida Deed, RN Electronic Signature(s) Signed: 01/11/2023 11:19:13 AM By: Duanne Guess MD FACS Signed: 01/11/2023 4:59:42 PM By: Zenaida Deed RN, BSN Entered By: Zenaida Deed on 01/11/2023 10:10:32 -------------------------------------------------------------------------------- HPI Details Patient Name: Date of Service: [name], [name] 01/11/2023 9:00 A M Medical Record Number: [id number] Patient Account Number: [id number] Date of Birth/Sex: Treating RN: 09-Jul-1932 (89 y.o. M) Primary Care Provider: Alcide Clever Other Clinician: Referring Provider: Treating Provider/Extender: Duanne Guess [facility], MICHELLE Weeks in Treatment: 0 History of Present Illness HPI Description: ADMISSION 01/11/2023 This is an 89 year old nondiabetic with a history of chronic venous insufficiency, coronary artery disease, congestive heart failure, and lower extremity edema. He has historically been followed by podiatry for various ulcers and calluses on his feet. When he was seen in the middle of April, he was noted to have an ulcer starting on his left ankle. He was prescribed a course of cephalexin and advised to apply Betadine to the wound with an Ace wrap. He was referred to the wound care center for further evaluation and management. Apparently the wound closed of its own accord prior to his  appointment with [place], but subsequently reopened. The notes from podiatry do indicate that compression and elevation were discussed as means to help heal ulceration and avoid future episodes. After the wound reopened, he was seen again and prescribed a course of Augmentin. He is now here for his wound care appointment. Electronic Signature(s) Signed: 01/11/2023 10:32:51 AM By: Duanne Guess MD FACS Previous Signature: 01/11/2023 9:20:58 AM Version By: Duanne Guess MD FACS Entered By: Duanne Guess on  01/11/2023 10:32:51 -------------------------------------------------------------------------------- Physical Exam Details Patient Name: Date of Service: [name] 01/11/2023 9:00 A M Medical Record Number: [id number] Patient Account Number: [id number] Date of Birth/Sex: Treating RN: [birth year] (89 y.o. M) Primary Care Provider: Alcide Clever Other Clinician: Referring Provider: Treating Provider/Extender: Duanne Guess [facility], MICHELLE Weeks in Treatment: 0 Constitutional . . . . No acute distress. Respiratory Normal work of breathing on room air. Cardiovascular . Hemosiderin deposits and other skin changes consistent with chronic venous insufficiency. 2+ pitting edema.[name], [name] ([id number]) [id number] Page 3 of 10 01/11/2023: On his medial left ankle, there is an irregular ulcer that extends into the fat layer. There is some slough and eschar accumulation. No concern for infection. Electronic Signature(s) Signed: 01/11/2023 10:34:13 AM By: Duanne Guess MD FACS Entered By: Duanne Guess on 01/11/2023 10:34:13 -------------------------------------------------------------------------------- Physician Orders Details Patient Name: Date of Service: [name], [name] 01/11/2023 9:00 A M Medical Record Number: [id number] Patient Account Number: [id number] Date of Birth/Sex: Treating RN: 27-Dec-1931 (89 y.o. Damaris Schooner Primary Care Provider: Alcide Clever Other Clinician: Referring Provider: Treating Provider/Extender: Duanne Guess [employer], MICHELLE Weeks in Treatment: 0 Verbal / Phone Orders: No Diagnosis Coding ICD-10 Coding Code Description [phone] Non-pressure chronic ulcer of left ankle with fat layer exposed I87.2 Venous insufficiency (chronic) (peripheral) I50.9 Heart failure, unspecified R60.0 Localized edema I25.10 Atherosclerotic heart disease of native coronary artery without angina pectoris Follow-up Appointments ppointment in 1 week. - Dr. Lady Gary RM 1 Return A Monday 6/10 @ 2:45 pm Anesthetic Wound #1 Left,Medial Lower Leg (In clinic) Topical Lidocaine 4% applied to wound bed Bathing/ Shower/ Hygiene May shower with protection but do not get wound dressing(s) wet. Protect dressing(s) with water repellant cover (for example, large plastic bag) or a cast cover and may then take shower. Edema Control - Lymphedema / SCD / Other Elevate legs to the level of the heart or above for 30 minutes daily and/or when sitting for 3-4 times a day throughout the day. - throughout the day Avoid standing for long periods of time. Exercise regularly Compression stocking or Garment 20-30 mm/Hg pressure to: - right leg daily Wound Treatment Wound #1 - Lower Leg Wound Laterality: Left, Medial Peri-Wound Care: Sween Lotion (Moisturizing lotion) 1 x Per Week Discharge Instructions: Apply moisturizing lotion as directed Prim Dressing: Maxorb Extra Ag+ Alginate Dressing, 2x2 (in/in) 1 x Per Week ary Discharge Instructions: Apply to wound bed as instructed Secondary Dressing: Zetuvit Plus 4x8 in 1 x Per Week Discharge Instructions: Apply over primary dressing as directed. Compression Wrap: Urgo K2 Lite, (equivalent to a 3 layer) two layer compression system, regular 1 x Per Week Discharge Instructions: Apply Urgo K2 Lite as directed (alternative to 3 layer compression). Services and  Therapies Venous Studies with reflux bil lower legs - bilateral lower extremity edema with open venous ulcer left medial lower leg CPT - (ICD10 I87.2 - Venous insufficiency (chronic) (peripheral)) Patient Medications CAHILL, FEASER ([id number]) [id number] Page 4 of 10 llergies: amlodipine, Lyrica, gabapentin A Notifications Medication Indication Start End prior to debridement 01/11/2023 lidocaine DOSE topical 4 % cream - cream topical Electronic Signature(s) Signed: 01/11/2023 11:19:13 AM By: Duanne Guess MD FACS Entered By: Duanne Guess on 01/11/2023 10:35:41 Prescription 01/11/2023 -------------------------------------------------------------------------------- [name] Duanne Guess MD Patient Name: Provider: 1932/06/01 [id number] Date of Birth: NPI#: Judie Petit [id number] Sex: DEA #: [phone] Phone #: License #: UPN: Patient Address: [address] [facility] [place], [place]  [address] [address] [place], [place] [address] [phone] Allergies amlodipine; Lyrica; gabapentin Provider's Orders Venous Studies with reflux bil lower legs - ICD10: I87.2 - bilateral lower extremity edema with open venous ulcer left medial lower leg CPT Hand Signature: Date(s): Electronic Signature(s) Signed: 01/11/2023 10:44:48 AM By: Duanne Guess MD FACS Entered By: Duanne Guess on 01/11/2023 10:44:48 -------------------------------------------------------------------------------- Problem List Details Patient Name: Date of Service: [name], Bayley [name]. 01/11/2023 9:00 A M Medical Record Number: [id number] Patient Account Number: [id number] Date of Birth/Sex: Treating RN: [birth year] (89 y.o. M) Primary Care Provider: Alcide Clever Other Clinician: Referring Provider: Treating Provider/Extender: Duanne Guess [facility], MICHELLE Weeks in Treatment: 0 Active  Problems ICD-10 Encounter Code Description Active Date MDM Diagnosis L97.322 Non-pressure chronic ulcer of left ankle with fat layer exposed 01/11/2023 No Yes [name], ALKIRE [name] ([id number]) [id number] Page 5 of 10 I87.2 Venous insufficiency (chronic) (peripheral) 01/11/2023 No Yes I50.9 Heart failure, unspecified 01/11/2023 No Yes R60.0 Localized edema 01/11/2023 No Yes I25.10 Atherosclerotic heart disease of native coronary artery without angina pectoris 01/11/2023 No Yes Inactive Problems Resolved Problems Electronic Signature(s) Signed: 01/11/2023 9:55:32 AM By: Duanne Guess MD FACS Previous Signature: 01/11/2023 9:13:54 AM Version By: Duanne Guess MD FACS Previous Signature: 01/11/2023 9:13:08 AM Version By: Duanne Guess MD FACS Entered By: Duanne Guess on 01/11/2023 09:55:32 -------------------------------------------------------------------------------- Progress Note Details Patient Name: Date of Service: [name], Densel [name]. 01/11/2023 9:00 A M Medical Record Number: [id number] Patient Account Number: [id number] Date of Birth/Sex: Treating RN: Jan 30, 1932 (89 y.o. M) Primary Care Provider: Alcide Clever Other Clinician: Referring Provider: Treating Provider/Extender: Duanne Guess [facility], MICHELLE Weeks in Treatment: 0 Subjective Chief Complaint Information obtained from Patient Patient presents for treatment of an open ulcer due to venous insufficiency History of Present Illness (HPI) ADMISSION 01/11/2023 This is an 89 year old nondiabetic with a history of chronic venous insufficiency, coronary artery disease, congestive heart failure, and lower extremity edema. He has historically been followed by podiatry for various ulcers and calluses on his feet. When he was seen in the middle of April, he was noted to have an ulcer starting on his left ankle. He was prescribed a course of cephalexin and advised to apply Betadine to the wound  with an Ace wrap. He was referred to the wound care center for further evaluation and management. Apparently the wound closed of its own accord prior to his appointment with [place], but subsequently reopened. The notes from podiatry do indicate that compression and elevation were discussed as means to help heal ulceration and avoid future episodes. After the wound reopened, he was seen again and prescribed a course of Augmentin. He is now here for his wound care appointment. Patient History Information obtained from Patient, Chart. Allergies amlodipine (Reaction: swelling), Lyrica (Reaction: swelling), gabapentin Family History Lung Disease - Siblings, Stroke - Siblings, No family history of Cancer, Diabetes, Heart Disease, Hereditary Spherocytosis, Hypertension, Kidney Disease, Seizures, Thyroid Problems, Tuberculosis. Social History Former smoker, Marital Status - Married, Alcohol Use - Moderate, Drug Use - No History, Caffeine Use - Rarely. Medical History Eyes Patient has history of Cataracts - bil extractions Denies history of Glaucoma, Optic Neuritis [name], [name] ([id number]) [id number] Page 6 of 10 Ear/Nose/Mouth/Throat Denies history of Chronic sinus problems/congestion, Middle ear problems Cardiovascular Patient has history of Congestive Heart Failure, Coronary Artery Disease, Hypertension, Peripheral Venous Disease Endocrine Denies history of Type I Diabetes, Type II Diabetes Integumentary (Skin) Denies history of History of Burn Musculoskeletal Patient has history of  Osteoarthritis Neurologic Patient has history of Neuropathy Oncologic Denies history of Received Chemotherapy, Received Radiation Psychiatric Denies history of Anorexia/bulimia, Confinement Anxiety Hospitalization/Surgery History - bil carpal tunnel release. - cardiac cath. - kyphoplasty. - ORIF patella left. - left total knee arthroplasty. - posterior lumbar fusion. - lumbar  laminectomy. - posterior cervical fusion. - appendectomy. - blepharoplasty. - cholecystectomy. - bil catract extraction. - right knee replacement. - melanoma excision. - rectal surgery fissure. - vitrectomy. Medical A Surgical History Notes nd Ear/Nose/Mouth/Throat seasonal allergies Musculoskeletal myositis, myalgia, left patella fracture, bursitis of hips, lumbar compression fx, spondylolistthesis Neurologic bil carpal tunnel Oncologic skin cancers Review of Systems (ROS) Constitutional Symptoms (General Health) Denies complaints or symptoms of Fatigue, Fever, Chills, Marked Weight Change. Eyes Denies complaints or symptoms of Dry Eyes, Vision Changes, Glasses / Contacts. Ear/Nose/Mouth/Throat Denies complaints or symptoms of Chronic sinus problems or rhinitis. Respiratory Denies complaints or symptoms of Chronic or frequent coughs, Shortness of Breath. Cardiovascular Denies complaints or symptoms of Chest pain. Gastrointestinal Denies complaints or symptoms of Frequent diarrhea, Nausea, Vomiting. Endocrine Denies complaints or symptoms of Heat/cold intolerance. Integumentary (Skin) Complains or has symptoms of Wounds - left lower leg. Neurologic Complains or has symptoms of Numbness/parasthesias. Psychiatric Denies complaints or symptoms of Claustrophobia. Objective Constitutional No acute distress. Vitals Time Taken: 9:19 AM, Height: 72 in, Weight: 198 lbs, BMI: 26.9, Temperature: 97.9 F, Pulse: 89 bpm, Respiratory Rate: 20 breaths/min, Blood Pressure: 120/71 mmHg. Respiratory Normal work of breathing on room air. Cardiovascular Hemosiderin deposits and other skin changes consistent with chronic venous insufficiency. 2+ pitting edema.. General Notes: 01/11/2023: On his medial left ankle, there is an irregular ulcer that extends into the fat layer. There is some slough and eschar accumulation. No concern for infection. Integumentary (Hair, Skin) Wound #1 status is Open.  Original cause of wound was Gradually Appeared. The date acquired was: 05/12/2022. The wound is located on the Left,Medial Lower Leg. The wound measures 5cm length x 1.8cm width x 0.1cm depth; 7.069cm^2 area and 0.707cm^3 volume. There is Fat Layer (Subcutaneous Tissue) exposed. There is no tunneling or undermining noted. There is a medium amount of serous drainage noted. The wound margin is flat and intact. There is small (1-33%) pink granulation within the wound bed. There is a large (67-100%) amount of necrotic tissue within the wound bed including Adherent Slough. The periwound skin appearance had no abnormalities noted for texture. The periwound skin appearance had no abnormalities noted for moisture. The periwound skin appearance exhibited: Hemosiderin Staining. Periwound temperature was noted as No Abnormality. The periwound has tenderness on palpation. [name], [name] ([id number]) [id number] Page 7 of 10 Assessment Active Problems ICD-10 Non-pressure chronic ulcer of left ankle with fat layer exposed Venous insufficiency (chronic) (peripheral) Heart failure, unspecified Localized edema Atherosclerotic heart disease of native coronary artery without angina pectoris Procedures Wound #1 Pre-procedure diagnosis of Wound #1 is a Venous Leg Ulcer located on the Left,Medial Lower Leg .Severity of Tissue Pre Debridement is: Fat layer exposed. There was a Selective/Open Wound Non-Viable Tissue Debridement with a total area of 7.06 sq cm performed by Duanne Guess, MD. With the following instrument(s): Curette to remove Non-Viable tissue/material. Material removed includes [facility] after achieving pain control using Lidocaine 4% Topical Solution. No specimens were taken. A time out was conducted at 10:05, prior to the start of the procedure. A Minimum amount of bleeding was controlled with Pressure. The procedure was tolerated well with a pain level of 4 throughout and  a pain level of  1 following the procedure. Post Debridement Measurements: 5cm length x 1.8cm width x 0.1cm depth; 0.707cm^3 volume. Character of Wound/Ulcer Post Debridement is improved. Severity of Tissue Post Debridement is: Fat layer exposed. Post procedure Diagnosis Wound #1: Same as Pre-Procedure General Notes: scribed for Dr. Lady Gary by Zenaida Deed, RN. Pre-procedure diagnosis of Wound #1 is a Venous Leg Ulcer located on the Left,Medial Lower Leg . There was a Double Layer Compression Therapy Procedure by Zenaida Deed, RN. Post procedure Diagnosis Wound #1: Same as Pre-Procedure Notes: urgo lite. Plan Follow-up Appointments: Return Appointment in 1 week. - Dr. Lady Gary RM 1 Monday 6/10 @ 2:45 pm Anesthetic: Wound #1 Left,Medial Lower Leg: (In clinic) Topical Lidocaine 4% applied to wound bed Bathing/ Shower/ Hygiene: May shower with protection but do not get wound dressing(s) wet. Protect dressing(s) with water repellant cover (for example, large plastic bag) or a cast cover and may then take shower. Edema Control - Lymphedema / SCD / Other: Elevate legs to the level of the heart or above for 30 minutes daily and/or when sitting for 3-4 times a day throughout the day. - throughout the day Avoid standing for long periods of time. Exercise regularly Compression stocking or Garment 20-30 mm/Hg pressure to: - right leg daily Services and Therapies ordered were: Venous Studies with reflux bil lower legs - bilateral lower extremity edema with open venous ulcer left medial lower leg CPT The following medication(s) was prescribed: lidocaine topical 4 % cream cream topical for prior to debridement was prescribed at facility WOUND #1: - Lower Leg Wound Laterality: Left, Medial Peri-Wound Care: Sween Lotion (Moisturizing lotion) 1 x Per Week/ Discharge Instructions: Apply moisturizing lotion as directed Prim Dressing: Maxorb Extra Ag+ Alginate Dressing, 2x2 (in/in) 1 x Per  Week/ ary Discharge Instructions: Apply to wound bed as instructed Secondary Dressing: Zetuvit Plus 4x8 in 1 x Per Week/ Discharge Instructions: Apply over primary dressing as directed. Com pression Wrap: Urgo K2 Lite, (equivalent to a 3 layer) two layer compression system, regular 1 x Per Week/ Discharge Instructions: Apply Urgo K2 Lite as directed (alternative to 3 layer compression). 01/11/2023: This is an 89 year old nondiabetic who presents with a venous leg ulcer. On his medial left ankle, there is an irregular ulcer that extends into the fat layer. There is some slough and eschar accumulation. No concern for infection. I used a curette to debride slough from the wound. We will apply silver alginate and 3 layer compression equivalent. He does not typically wear compression stockings secondary to the neuropathy in his feet which makes them uncomfortable. I discussed the potential etiologies of his ulcer, specifically related to venous reflux. I am going to send him for venous reflux studies and if positive, he may be a candidate for saphenous vein ablation. He will follow-up in 1 week. Electronic Signature(s) Signed: 02/11/2023 2:24:09 PM By: Pearletha Alfred Signed: 02/11/2023 2:55:45 PM By: Duanne Guess MD FACS Previous Signature: 01/11/2023 10:37:01 AM Version By: Duanne Guess MD FACS Entered By: Pearletha Alfred on 02/11/2023 14:24:09 [name] ([id number]) [id number] Page 8 of 10 -------------------------------------------------------------------------------- HxROS Details Patient Name: Date of Service: [place] NA [name] 01/11/2023 9:00 A M Medical Record Number: [id number] Patient Account Number: [id number] Date of Birth/Sex: Treating RN: 1931-12-14 (89 y.o. Damaris Schooner Primary Care Provider: Alcide Clever Other Clinician: Referring Provider: Treating Provider/Extender: Duanne Guess [facility], MICHELLE Weeks in Treatment:  0 Information Obtained From Patient Chart Constitutional Symptoms (General Health) Complaints and Symptoms: Negative for: Fatigue; Fever; Chills; Marked  Weight Change Eyes Complaints and Symptoms: Negative for: Dry Eyes; Vision Changes; Glasses / Contacts Medical History: Positive for: Cataracts - bil extractions Negative for: Glaucoma; Optic Neuritis Ear/Nose/Mouth/Throat Complaints and Symptoms: Negative for: Chronic sinus problems or rhinitis Medical History: Negative for: Chronic sinus problems/congestion; Middle ear problems Past Medical History Notes: seasonal allergies Respiratory Complaints and Symptoms: Negative for: Chronic or frequent coughs; Shortness of Breath Cardiovascular Complaints and Symptoms: Negative for: Chest pain Medical History: Positive for: Congestive Heart Failure; Coronary Artery Disease; Hypertension; Peripheral Venous Disease Gastrointestinal Complaints and Symptoms: Negative for: Frequent diarrhea; Nausea; Vomiting Endocrine Complaints and Symptoms: Negative for: Heat/cold intolerance Medical History: Negative for: Type I Diabetes; Type II Diabetes Integumentary (Skin) Complaints and Symptoms: Positive for: Wounds - left lower leg Medical History: Negative for: History of Burn Neurologic Complaints and Symptoms: STANLEY, MERLE ([id number]) [id number] Page 9 of 10 Positive for: Numbness/parasthesias Medical History: Positive for: Neuropathy Past Medical History Notes: bil carpal tunnel Psychiatric Complaints and Symptoms: Negative for: Claustrophobia Medical History: Negative for: Anorexia/bulimia; Confinement Anxiety Hematologic/Lymphatic Immunological Musculoskeletal Medical History: Positive for: Osteoarthritis Past Medical History Notes: myositis, myalgia, left patella fracture, bursitis of hips, lumbar compression fx, spondylolistthesis Oncologic Medical History: Negative for: Received  Chemotherapy; Received Radiation Past Medical History Notes: skin cancers HBO Extended History Items Eyes: Cataracts Immunizations Pneumococcal Vaccine: Received Pneumococcal Vaccination: No Implantable Devices No devices added Hospitalization / Surgery History Type of Hospitalization/Surgery bil carpal tunnel release cardiac cath kyphoplasty ORIF patella left left total knee arthroplasty posterior lumbar fusion lumbar laminectomy posterior cervical fusion appendectomy blepharoplasty cholecystectomy bil catract extraction right knee replacement melanoma excision rectal surgery fissure vitrectomy Family and Social History Cancer: No; Diabetes: No; Heart Disease: No; Hereditary Spherocytosis: No; Hypertension: No; Kidney Disease: No; Lung Disease: Yes - Siblings; Seizures: No; Stroke: Yes - Siblings; Thyroid Problems: No; Tuberculosis: No; Former smoker; Marital Status - Married; Alcohol Use: Moderate; Drug Use: No History; Caffeine Use: Rarely; Financial Concerns: No; Food, Clothing or Shelter Needs: No; Support System Lacking: No; Transportation Concerns: No Electronic Signature(s) Signed: 01/11/2023 11:19:13 AM By: Duanne Guess MD FACS Signed: 01/11/2023 4:59:42 PM By: Zenaida Deed RN, BSN Entered By: Zenaida Deed on 01/11/2023 09:43:04 BARACK, KLEFFNER [name] ([id number]) [id number] Page 10 of 10 -------------------------------------------------------------------------------- SuperBill Details Patient Name: Date of Service: [name] 01/11/2023 Medical Record Number: [id number] Patient Account Number: [id number] Date of Birth/Sex: Treating RN: 1932/06/01 (89 y.o. Damaris Schooner Primary Care Provider: Alcide Clever Other Clinician: Referring Provider: Treating Provider/Extender: Duanne Guess [facility], MICHELLE Weeks in Treatment: 0 Diagnosis Coding ICD-10 Codes Code Description [phone] Non-pressure chronic ulcer of  left ankle with fat layer exposed I87.2 Venous insufficiency (chronic) (peripheral) I50.9 Heart failure, unspecified R60.0 Localized edema I25.10 Atherosclerotic heart disease of native coronary artery without angina pectoris Facility Procedures : CPT4 Code: [id number] Description: 84132 - WOUND CARE VISIT-LEV 2 EST PT Modifier: 25 Quantity: 1 : CPT4 Code: [id number] Description: 97597 - DEBRIDE WOUND 1ST 20 SQ CM OR < ICD-10 Diagnosis Description L97.322 Non-pressure chronic ulcer of left ankle with fat layer exposed Modifier: Quantity: 1 Physician Procedures : CPT4 Code Description Modifier [id number] - WC PHYS LEVEL 4 - NEW PT 25 ICD-10 Diagnosis Description L97.322 Non-pressure chronic ulcer of left ankle with fat layer exposed I87.2 Venous insufficiency (chronic) (peripheral) R60.0 Localized edema  I50.9 Heart failure, unspecified Quantity: 1 : [id number] 97597 - WC PHYS DEBR WO ANESTH 20 SQ CM ICD-10 Diagnosis Description L97.322 Non-pressure chronic ulcer of left ankle with fat layer  exposed Quantity: 1 Electronic Signature(s) Signed: 01/11/2023 10:37:26 AM By: Duanne Guess MD FACS Entered By: Duanne Guess on 01/11/2023 10:37:26

## 2023-02-15 ENCOUNTER — Encounter (HOSPITAL_BASED_OUTPATIENT_CLINIC_OR_DEPARTMENT_OTHER): Payer: Medicare Other | Attending: General Surgery | Admitting: General Surgery

## 2023-02-15 DIAGNOSIS — I2510 Atherosclerotic heart disease of native coronary artery without angina pectoris: Secondary | ICD-10-CM | POA: Insufficient documentation

## 2023-02-15 DIAGNOSIS — I872 Venous insufficiency (chronic) (peripheral): Secondary | ICD-10-CM | POA: Diagnosis not present

## 2023-02-15 DIAGNOSIS — L97812 Non-pressure chronic ulcer of other part of right lower leg with fat layer exposed: Secondary | ICD-10-CM | POA: Diagnosis present

## 2023-02-15 DIAGNOSIS — I509 Heart failure, unspecified: Secondary | ICD-10-CM | POA: Diagnosis not present

## 2023-02-15 DIAGNOSIS — L84 Corns and callosities: Secondary | ICD-10-CM | POA: Diagnosis not present

## 2023-02-15 DIAGNOSIS — R600 Localized edema: Secondary | ICD-10-CM | POA: Insufficient documentation

## 2023-02-15 NOTE — Progress Notes (Addendum)
KENSUKE, PANZARELLA ([id number]) [id number] Page 1 of 10 Visit Report for 02/15/2023 Chief Complaint Document Details Patient Name: Date of Service: [name] 02/15/2023 11:30 A M Medical Record Number: [id number] Patient Account Number: [id number] Date of Birth/Sex: Treating RN: May 02, 1932 (89 y.o. M) Primary Care Provider: Alcide Clever Other Clinician: Referring Provider: Treating Provider/Extender: Duanne Guess [facility], MICHELLE Weeks in Treatment: 5 Information Obtained from: Patient Chief Complaint Patient presents for treatment of an open ulcer due to venous insufficiency Electronic Signature(s) Signed: 02/15/2023 12:25:09 PM By: Duanne Guess MD FACS Entered By: Duanne Guess on 02/15/2023 12:25:09 -------------------------------------------------------------------------------- Debridement Details Patient Name: Date of Service: [name], Jordan E. 02/15/2023 11:30 A M Medical Record Number: [id number] Patient Account Number: [id number] Date of Birth/Sex: Treating RN: 05/14/1932 (89 y.o. M) Primary Care Provider: Alcide Clever Other Clinician: Referring Provider: Treating Provider/Extender: Duanne Guess [facility], MICHELLE Weeks in Treatment: 5 Debridement Performed for Assessment: Wound #2 Right,Medial Ankle Performed By: Physician Duanne Guess, MD Debridement Type: Debridement Severity of Tissue Pre Debridement: Fat layer exposed Level of Consciousness (Pre-procedure): Awake and Alert Pre-procedure Verification/Time Out Yes - 12:14 Taken: Start Time: 12:16 Percent of Wound Bed Debrided: 100% T Area Debrided (cm): otal 7.91 Tissue and other material debrided: Non-Viable, Slough, Slough Level: Non-Viable Tissue Debridement Description: Selective/Open Wound Instrument: Curette Bleeding: Minimum Hemostasis Achieved: Pressure End Time: 12:19 Procedural Pain: 0 Post Procedural Pain: 0 Response to Treatment:  Procedure was tolerated well Level of Consciousness (Post- Awake and Alert procedure): Post Debridement Measurements of Total Wound Length: (cm) 2.1 Width: (cm) 4.8 Depth: (cm) 0.1 Volume: (cm) 0.792 Character of Wound/Ulcer Post Debridement: Stable Severity of Tissue Post Debridement: Fat layer exposed WIL, BLEE E ([id number]) [id number] Page 2 of 10 Post Procedure Diagnosis Same as Pre-procedure Notes Scribed for Dr Lady Gary by Brenton Grills RN. Electronic Signature(s) Signed: 02/15/2023 12:25:02 PM By: Duanne Guess MD FACS Entered By: Duanne Guess on 02/15/2023 12:25:02 -------------------------------------------------------------------------------- HPI Details Patient Name: Date of Service: [name], Jordan E. 02/15/2023 11:30 A M Medical Record Number: [id number] Patient Account Number: [id number] Date of Birth/Sex: Treating RN: April 16, 1932 (89 y.o. M) Primary Care Provider: Alcide Clever Other Clinician: Referring Provider: Treating Provider/Extender: Duanne Guess [facility], MICHELLE Weeks in Treatment: 5 History of Present Illness HPI Description: ADMISSION 01/11/2023 This is an 89 year old nondiabetic with a history of chronic venous insufficiency, coronary artery disease, congestive heart failure, and lower extremity edema. He has historically been followed by podiatry for various ulcers and calluses on his feet. When he was seen in the middle of April, he was noted to have an ulcer starting on his left ankle. He was prescribed a course of cephalexin and advised to apply Betadine to the wound with an Ace wrap. He was referred to the wound care center for further evaluation and management. Apparently the wound closed of its own accord prior to his appointment with [place], but subsequently reopened. The notes from podiatry do indicate that compression and elevation were discussed as means to help heal ulceration and avoid future  episodes. After the wound reopened, he was seen again and prescribed a course of Augmentin. He is now here for his wound care appointment. 01/21/2023: The wound is smaller and more superficial. It is fairly clean with just a little bit of light slough on the surface. His venous reflux studies are scheduled for this coming Thursday. 01/29/2023: The wound is smaller again today and very superficial. There is minimal slough present with a little eschar around the  edges. His venous reflux studies were completed, and as expected, he has severe venous reflux. 02/07/2023: His wound is very nearly closed. He has an appointment coming up with vascular surgery on July 8. He indicated a callus on the bottom of his left foot that is bothering him today. 02/15/2023: There is just a pinhole opening remaining on the left, but unfortunately, he has had a new ulceration occur on the medial aspect of his right lower leg. The fat layer is exposed. He does have 2+ pitting edema in this leg. He says he noted fluid coming from the site a couple of days ago. He does not wear compression garments due to discomfort with stockings, but he has never received juxta lite or Farrow wrap garments. Electronic Signature(s) Signed: 02/15/2023 12:26:29 PM By: Duanne Guess MD FACS Entered By: Duanne Guess on 02/15/2023 12:26:29 -------------------------------------------------------------------------------- Physical Exam Details Patient Name: Date of Service: [name], Jordan E. 02/15/2023 11:30 A M Medical Record Number: [id number] Patient Account Number: [id number] Date of Birth/Sex: Treating RN: Jan 19, 1932 (89 y.o. M) Primary Care Provider: Alcide Clever Other Clinician: Referring Provider: Treating Provider/Extender: Duanne Guess [facility], MICHELLE Weeks in Treatment: 5 Constitutional . . . . no acute distress. MAURIZIO, BRANDENBERGER ([id number]) [id number] Page 3 of 10 Respiratory Normal  work of breathing on room air. Notes 02/15/2023: There is just a pinhole opening remaining on the left, but unfortunately, he has had a new ulceration occur on the medial aspect of his right lower leg. The fat layer is exposed. He does have 2+ pitting edema in this leg. Electronic Signature(s) Signed: 02/15/2023 12:26:59 PM By: Duanne Guess MD FACS Entered By: Duanne Guess on 02/15/2023 12:26:59 -------------------------------------------------------------------------------- Physician Orders Details Patient Name: Date of Service: [name], Jordan E. 02/15/2023 11:30 A M Medical Record Number: [id number] Patient Account Number: [id number] Date of Birth/Sex: Treating RN: 1931/12/14 (89 y.o. Yates Decamp Primary Care Provider: Alcide Clever Other Clinician: Referring Provider: Treating Provider/Extender: Duanne Guess [facility], MICHELLE Weeks in Treatment: 5 Verbal / Phone Orders: No Diagnosis Coding ICD-10 Coding Code Description [phone] Non-pressure chronic ulcer of left ankle with fat layer exposed 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ed I87.2 Venous insufficiency (chronic) (peripheral) I50.9 Heart failure, unspecified R60.0 Localized edema I25.10 Atherosclerotic heart disease of native coronary artery without angina pectoris L84 Corns and callosities Follow-up Appointments ppointment in 1 week. - Dr. Lady Gary RM 1 Return A 11:30 on Friday 02/15/23 Anesthetic Wound #1 Left,Medial Lower Leg (In clinic) Topical Lidocaine 4% applied to wound bed Bathing/ Shower/ Hygiene May shower with protection but do not get wound dressing(s) wet. Protect dressing(s) with water repellant cover (for example, large plastic bag) or a cast cover and may then take shower. Edema Control - Lymphedema / SCD / Other Elevate legs to the level of the heart or above for 30 minutes daily and/or when sitting for 3-4 times a day throughout the day.  - throughout the day Avoid standing for long periods of time. Exercise regularly Compression stocking or Garment 20-30 mm/Hg pressure to: - right leg daily Wound Treatment Wound #1 - Lower Leg Wound Laterality: Left, Medial Cleanser: Soap and Water 1 x Per Week/30 Days Discharge Instructions: May shower and wash wound with dial antibacterial soap and water prior to dressing change. Cleanser: Wound Cleanser 1 x Per Week/30 Days Discharge Instructions: Cleanse the wound with wound cleanser prior to applying a clean dressing using gauze sponges, not tissue or cotton  balls. Peri-Wound Care: Sween Lotion (Moisturizing lotion) 1 x Per Week/30 Days Discharge Instructions: Apply moisturizing lotion as directed Prim Dressing: Maxorb Extra Ag+ Alginate Dressing, 2x2 (in/in) 1 x Per Week/30 Days ary Discharge Instructions: Apply to wound bed as instructed HUTCH, CAPLINGER ([id number]) [id number] Page 4 of 10 Secondary Dressing: Woven Gauze Sponge, Non-Sterile 4x4 in 1 x Per Week/30 Days Discharge Instructions: Apply over primary dressing as directed. Compression Wrap: Urgo K2 Lite, (equivalent to a 3 layer) two layer compression system, regular 1 x Per Week/30 Days Discharge Instructions: Apply Urgo K2 Lite as directed (alternative to 3 layer compression). Compression Stockings: Jobst Farrow Wrap 4000 (DME) Left Leg Compression Amount: 30-40 mmHG Discharge Instructions: Apply Renee Pain daily as instructed. Apply first thing in the morning, remove at night before bed. Wound #2 - Ankle Wound Laterality: Right, Medial Cleanser: Soap and Water 1 x Per Week/30 Days Discharge Instructions: May shower and wash wound with dial antibacterial soap and water prior to dressing change. Cleanser: Wound Cleanser 1 x Per Week/30 Days Discharge Instructions: Cleanse the wound with wound cleanser prior to applying a clean dressing using gauze sponges, not tissue or cotton  balls. Peri-Wound Care: Sween Lotion (Moisturizing lotion) 1 x Per Week/30 Days Discharge Instructions: Apply moisturizing lotion as directed Prim Dressing: Maxorb Extra Ag+ Alginate Dressing, 2x2 (in/in) 1 x Per Week/30 Days ary Discharge Instructions: Apply to wound bed as instructed Secondary Dressing: Woven Gauze Sponge, Non-Sterile 4x4 in 1 x Per Week/30 Days Discharge Instructions: Apply over primary dressing as directed. Compression Wrap: Urgo K2 Lite, (equivalent to a 3 layer) two layer compression system, regular 1 x Per Week/30 Days Discharge Instructions: Apply Urgo K2 Lite as directed (alternative to 3 layer compression). Compression Stockings: Jobst Farrow Wrap 4000 (DME) Right Leg Compression Amount: 30-40 mmHG Discharge Instructions: Apply Renee Pain daily as instructed. Apply first thing in the morning, remove at night before bed. Electronic Signature(s) Signed: 02/15/2023 12:29:13 PM By: Duanne Guess MD FACS Entered By: Duanne Guess on 02/15/2023 12:27:28 -------------------------------------------------------------------------------- Problem List Details Patient Name: Date of Service: [name], Jordan E. 02/15/2023 11:30 A M Medical Record Number: [id number] Patient Account Number: [id number] Date of Birth/Sex: Treating RN: 26-Dec-1931 (89 y.o. M) Primary Care Provider: Alcide Clever Other Clinician: Referring Provider: Treating Provider/Extender: Duanne Guess [facility], MICHELLE Weeks in Treatment: 5 Active Problems ICD-10 Encounter Code Description Active Date MDM Diagnosis L97.322 Non-pressure chronic ulcer of left ankle with fat layer exposed 01/11/2023 No Yes L97.812 Non-pressure chronic ulcer of other part of right lower leg with fat layer 02/15/2023 No Yes exposed I87.2 Venous insufficiency (chronic) (peripheral) 01/11/2023 No Yes I50.9 Heart failure, unspecified 01/11/2023 No Yes FABIO, HORATH E ([id number]) [id number]  Page 5 of 10 R60.0 Localized edema 01/11/2023 No Yes I25.10 Atherosclerotic heart disease of native coronary artery without angina pectoris 01/11/2023 No Yes L84 Corns and callosities 02/07/2023 No Yes Inactive Problems Resolved Problems Electronic Signature(s) Signed: 02/15/2023 12:24:37 PM By: Duanne Guess MD FACS Previous Signature: 02/15/2023 11:46:22 AM Version By: Duanne Guess MD FACS Entered By: Duanne Guess on 02/15/2023 12:24:37 -------------------------------------------------------------------------------- Progress Note Details Patient Name: Date of Service: [name], Cashus E. 02/15/2023 11:30 A M Medical Record Number: [id number] Patient Account Number: [id number] Date of Birth/Sex: Treating RN: 1932/06/08 (89 y.o. M) Primary Care Provider: Alcide Clever Other Clinician: Referring Provider: Treating Provider/Extender: Duanne Guess [facility], MICHELLE Weeks in Treatment: 5 Subjective Chief Complaint Information obtained from Patient Patient presents for treatment of an open ulcer due to venous  insufficiency History of Present Illness (HPI) ADMISSION 01/11/2023 This is an 89 year old nondiabetic with a history of chronic venous insufficiency, coronary artery disease, congestive heart failure, and lower extremity edema. He has historically been followed by podiatry for various ulcers and calluses on his feet. When he was seen in the middle of April, he was noted to have an ulcer starting on his left ankle. He was prescribed a course of cephalexin and advised to apply Betadine to the wound with an Ace wrap. He was referred to the wound care center for further evaluation and management. Apparently the wound closed of its own accord prior to his appointment with [place], but subsequently reopened. The notes from podiatry do indicate that compression and elevation were discussed as means to help heal ulceration and avoid future episodes. After the wound reopened, he was seen  again and prescribed a course of Augmentin. He is now here for his wound care appointment. 01/21/2023: The wound is smaller and more superficial. It is fairly clean with just a little bit of light slough on the surface. His venous reflux studies are scheduled for this coming Thursday. 01/29/2023: The wound is smaller again today and very superficial. There is minimal slough present with a little eschar around the edges. His venous reflux studies were completed, and as expected, he has severe venous reflux. 02/07/2023: His wound is very nearly closed. He has an appointment coming up with vascular surgery on July 8. He indicated a callus on the bottom of his left foot that is bothering him today. 02/15/2023: There is just a pinhole opening remaining on the left, but unfortunately, he has had a new ulceration occur on the medial aspect of his right lower leg. The fat layer is exposed. He does have 2+ pitting edema in this leg. He says he noted fluid coming from the site a couple of days ago. He does not wear compression garments due to discomfort with stockings, but he has never received juxta lite or Farrow wrap garments. Patient History Information obtained from Patient, Chart. Family History Lung Disease - Siblings, Stroke - Siblings, No family history of Cancer, Diabetes, Heart Disease, Hereditary Spherocytosis, Hypertension, Kidney Disease, Seizures, Thyroid Problems, Tuberculosis. Social History Former smoker, Marital Status - Married, Alcohol Use - Moderate, Drug Use - No History, Caffeine Use - Rarely. CHAYSON, GRALL ([id number]) [id number] Page 6 of 10 Medical History Eyes Patient has history of Cataracts - bil extractions Denies history of Glaucoma, Optic Neuritis Ear/Nose/Mouth/Throat Denies history of Chronic sinus problems/congestion, Middle ear problems Cardiovascular Patient has history of Congestive Heart Failure, Coronary Artery Disease, Hypertension,  Peripheral Venous Disease Endocrine Denies history of Type I Diabetes, Type II Diabetes Integumentary (Skin) Denies history of History of Burn Musculoskeletal Patient has history of Osteoarthritis Neurologic Patient has history of Neuropathy Oncologic Denies history of Received Chemotherapy, Received Radiation Psychiatric Denies history of Anorexia/bulimia, Confinement Anxiety Hospitalization/Surgery History - bil carpal tunnel release. - cardiac cath. - kyphoplasty. - ORIF patella left. - left total knee arthroplasty. - posterior lumbar fusion. - lumbar laminectomy. - posterior cervical fusion. - appendectomy. - blepharoplasty. - cholecystectomy. - bil catract extraction. - right knee replacement. - melanoma excision. - rectal surgery fissure. - vitrectomy. Medical A Surgical History Notes nd Ear/Nose/Mouth/Throat seasonal allergies Musculoskeletal myositis, myalgia, left patella fracture, bursitis of hips, lumbar compression fx, spondylolistthesis Neurologic bil carpal tunnel Oncologic skin cancers Objective Constitutional no acute distress. Vitals Time Taken: 11:36 AM, Height: 72 in, Weight: 198 lbs, BMI: 26.9, Temperature: 98.1 F,  Pulse: 91 bpm, Respiratory Rate: 20 breaths/min, Blood Pressure: 125/61 mmHg. Respiratory Normal work of breathing on room air. General Notes: 02/15/2023: There is just a pinhole opening remaining on the left, but unfortunately, he has had a new ulceration occur on the medial aspect of his right lower leg. The fat layer is exposed. He does have 2+ pitting edema in this leg. Integumentary (Hair, Skin) Wound #1 status is Open. Original cause of wound was Gradually Appeared. The date acquired was: 05/12/2022. The wound has been in treatment 5 weeks. The wound is located on the Left,Medial Lower Leg. The wound measures 0.1cm length x 0.1cm width x 0.1cm depth; 0.008cm^2 area and 0.001cm^3 volume. There is Fat Layer (Subcutaneous Tissue) exposed. There is  a medium amount of serosanguineous drainage noted. The wound margin is flat and intact. There is small (1-33%) pink granulation within the wound bed. There is a large (67-100%) amount of necrotic tissue within the wound bed including Eschar and Adherent Slough. The periwound skin appearance had no abnormalities noted for texture. The periwound skin appearance had no abnormalities noted for moisture. The periwound skin appearance exhibited: Hemosiderin Staining. Periwound temperature was noted as No Abnormality. Wound #2 status is Open. Original cause of wound was Gradually Appeared. The date acquired was: 02/12/2023. The wound is located on the Right,Medial Ankle. The wound measures 2.1cm length x 4.8cm width x 0.1cm depth; 7.917cm^2 area and 0.792cm^3 volume. There is Fat Layer (Subcutaneous Tissue) exposed. There is no tunneling or undermining noted. There is a medium amount of serosanguineous drainage noted. The wound margin is distinct with the outline attached to the wound base. There is medium (34-66%) red, pink granulation within the wound bed. There is a medium (34-66%) amount of necrotic tissue within the wound bed including Adherent Slough. The periwound skin appearance exhibited: Scarring, Hemosiderin Staining. Periwound temperature was noted as No Abnormality. The periwound has tenderness on palpation. Assessment Active Problems ICD-10 Non-pressure chronic ulcer of left ankle with fat layer exposed Non-pressure chronic ulcer of other part of right lower leg with fat layer exposed Venous insufficiency (chronic) (peripheral) Heart failure, unspecified Localized edema BURNELL, HANIFF E ([id number]) [id number] Page 7 of 10 Atherosclerotic heart disease of native coronary artery without angina pectoris Corns and callosities Procedures Wound #2 Pre-procedure diagnosis of Wound #2 is a Venous Leg Ulcer located on the Right,Medial Ankle .Severity of Tissue Pre  Debridement is: Fat layer exposed. There was a Selective/Open Wound Non-Viable Tissue Debridement with a total area of 7.91 sq cm performed by Duanne Guess, MD. With the following instrument(s): Curette to remove Non-Viable tissue/material. Material removed includes [facility]. No specimens were taken. A time out was conducted at 12:14, prior to the start of the procedure. A Minimum amount of bleeding was controlled with Pressure. The procedure was tolerated well with a pain level of 0 throughout and a pain level of 0 following the procedure. Post Debridement Measurements: 2.1cm length x 4.8cm width x 0.1cm depth; 0.792cm^3 volume. Character of Wound/Ulcer Post Debridement is stable. Severity of Tissue Post Debridement is: Fat layer exposed. Post procedure Diagnosis Wound #2: Same as Pre-Procedure General Notes: Scribed for Dr Lady Gary by Brenton Grills RN.Marland Kitchen Plan Follow-up Appointments: Return Appointment in 1 week. - Dr. Lady Gary RM 1 11:30 on Friday 02/15/23 Anesthetic: Wound #1 Left,Medial Lower Leg: (In clinic) Topical Lidocaine 4% applied to wound bed Bathing/ Shower/ Hygiene: May shower with protection but do not get wound dressing(s) wet. Protect dressing(s) with water repellant cover (for example, large plastic  bag) or a cast cover and may then take shower. Edema Control - Lymphedema / SCD / Other: Elevate legs to the level of the heart or above for 30 minutes daily and/or when sitting for 3-4 times a day throughout the day. - throughout the day Avoid standing for long periods of time. Exercise regularly Compression stocking or Garment 20-30 mm/Hg pressure to: - right leg daily WOUND #1: - Lower Leg Wound Laterality: Left, Medial Cleanser: Soap and Water 1 x Per Week/30 Days Discharge Instructions: May shower and wash wound with dial antibacterial soap and water prior to dressing change. Cleanser: Wound Cleanser 1 x Per Week/30 Days Discharge Instructions: Cleanse the wound with wound  cleanser prior to applying a clean dressing using gauze sponges, not tissue or cotton balls. Peri-Wound Care: Sween Lotion (Moisturizing lotion) 1 x Per Week/30 Days Discharge Instructions: Apply moisturizing lotion as directed Prim Dressing: Maxorb Extra Ag+ Alginate Dressing, 2x2 (in/in) 1 x Per Week/30 Days ary Discharge Instructions: Apply to wound bed as instructed Secondary Dressing: Woven Gauze Sponge, Non-Sterile 4x4 in 1 x Per Week/30 Days Discharge Instructions: Apply over primary dressing as directed. Com pression Wrap: Urgo K2 Lite, (equivalent to a 3 layer) two layer compression system, regular 1 x Per Week/30 Days Discharge Instructions: Apply Urgo K2 Lite as directed (alternative to 3 layer compression). Com pression Stockings: Jobst Farrow Wrap 4000 (DME) Compression Amount: 30-40 mmHg (left) Discharge Instructions: Apply Renee Pain daily as instructed. Apply first thing in the morning, remove at night before bed. WOUND #2: - Ankle Wound Laterality: Right, Medial Cleanser: Soap and Water 1 x Per Week/30 Days Discharge Instructions: May shower and wash wound with dial antibacterial soap and water prior to dressing change. Cleanser: Wound Cleanser 1 x Per Week/30 Days Discharge Instructions: Cleanse the wound with wound cleanser prior to applying a clean dressing using gauze sponges, not tissue or cotton balls. Peri-Wound Care: Sween Lotion (Moisturizing lotion) 1 x Per Week/30 Days Discharge Instructions: Apply moisturizing lotion as directed Prim Dressing: Maxorb Extra Ag+ Alginate Dressing, 2x2 (in/in) 1 x Per Week/30 Days ary Discharge Instructions: Apply to wound bed as instructed Secondary Dressing: Woven Gauze Sponge, Non-Sterile 4x4 in 1 x Per Week/30 Days Discharge Instructions: Apply over primary dressing as directed. Com pression Wrap: Urgo K2 Lite, (equivalent to a 3 layer) two layer compression system, regular 1 x Per Week/30 Days Discharge Instructions: Apply  Urgo K2 Lite as directed (alternative to 3 layer compression). Com pression Stockings: Jobst Farrow Wrap 4000 (DME) Compression Amount: 30-40 mmHg (right) Discharge Instructions: Apply Renee Pain daily as instructed. Apply first thing in the morning, remove at night before bed. 02/15/2023: There is just a pinhole opening remaining on the left, but unfortunately, he has had a new ulceration occur on the medial aspect of his right lower leg. The fat layer is exposed. He does have 2+ pitting edema in this leg. The left leg did not require debridement. I debrided slough from the right leg. We will apply silver alginate to both sites and bilateral 3 layer compression. We are going to order [facility] LLC wraps for both of his legs so that when he is completely healed, he will have compression garments available. He does have an appointment with vascular surgery on Monday to address his venous reflux. Electronic Signature(s) Signed: 02/15/2023 12:28:17 PM By: Duanne Guess MD FACS Entered By: Duanne Guess on 02/15/2023 12:28:17 [name] ([id number]) [id number] Page 8 of 10 -------------------------------------------------------------------------------- HxROS Details Patient Name: Date of Service:  [name], Howell E. 02/15/2023 11:30 A M Medical Record Number: [id number] Patient Account Number: [id number] Date of Birth/Sex: Treating RN: 12/16/1931 (89 y.o. M) Primary Care Provider: Alcide Clever Other Clinician: Referring Provider: Treating Provider/Extender: Duanne Guess [facility], MICHELLE Weeks in Treatment: 5 Information Obtained From Patient Chart Eyes Medical History: Positive for: Cataracts - bil extractions Negative for: Glaucoma; Optic Neuritis Ear/Nose/Mouth/Throat Medical History: Negative for: Chronic sinus problems/congestion; Middle ear problems Past Medical History Notes: seasonal allergies Cardiovascular Medical History: Positive for:  Congestive Heart Failure; Coronary Artery Disease; Hypertension; Peripheral Venous Disease Endocrine Medical History: Negative for: Type I Diabetes; Type II Diabetes Integumentary (Skin) Medical History: Negative for: History of Burn Musculoskeletal Medical History: Positive for: Osteoarthritis Past Medical History Notes: myositis, myalgia, left patella fracture, bursitis of hips, lumbar compression fx, spondylolistthesis Neurologic Medical History: Positive for: Neuropathy Past Medical History Notes: bil carpal tunnel Oncologic Medical History: Negative for: Received Chemotherapy; Received Radiation Past Medical History Notes: skin cancers Psychiatric Medical History: Negative for: Anorexia/bulimia; Confinement Anxiety HBO Extended History Items Eyes: Cataracts LYNWARD, BOURBON ([id number]) [id number] Page 9 of 10 Immunizations Pneumococcal Vaccine: Received Pneumococcal Vaccination: No Implantable Devices No devices added Hospitalization / Surgery History Type of Hospitalization/Surgery bil carpal tunnel release cardiac cath kyphoplasty ORIF patella left left total knee arthroplasty posterior lumbar fusion lumbar laminectomy posterior cervical fusion appendectomy blepharoplasty cholecystectomy bil catract extraction right knee replacement melanoma excision rectal surgery fissure vitrectomy Family and Social History Cancer: No; Diabetes: No; Heart Disease: No; Hereditary Spherocytosis: No; Hypertension: No; Kidney Disease: No; Lung Disease: Yes - Siblings; Seizures: No; Stroke: Yes - Siblings; Thyroid Problems: No; Tuberculosis: No; Former smoker; Marital Status - Married; Alcohol Use: Moderate; Drug Use: No History; Caffeine Use: Rarely; Financial Concerns: No; Food, Clothing or Shelter Needs: No; Support System Lacking: No; Transportation Concerns: No Electronic Signature(s) Signed: 02/15/2023 12:29:13 PM By: Duanne Guess MD  FACS Entered By: Duanne Guess on 02/15/2023 12:26:37 -------------------------------------------------------------------------------- SuperBill Details Patient Name: Date of Service: [name], Jordan E. 02/15/2023 Medical Record Number: [id number] Patient Account Number: [id number] Date of Birth/Sex: Treating RN: 24-Dec-1931 (89 y.o. Yates Decamp Primary Care Provider: Alcide Clever Other Clinician: Referring Provider: Treating Provider/Extender: Duanne Guess [facility], MICHELLE Weeks in Treatment: 5 Diagnosis Coding ICD-10 Codes Code Description [phone] Non-pressure chronic ulcer of left ankle with fat layer exposed 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ed I87.2 Venous insufficiency (chronic) (peripheral) I50.9 Heart failure, unspecified R60.0 Localized edema I25.10 Atherosclerotic heart disease of native coronary artery without angina pectoris L84 Corns and callosities Facility Procedures : ESIQUIO, PIDCOCK Code: [id number] Doron E ([id number] Description: [phone] - DEBRIDE WOUND 1ST 20 SQ CM OR < ICD-10 Diagnosis Description 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 ) [id number] Modifier: ed [id number] Quantity: 1 .pdf Page 10 of 10 Physician Procedures : CPT4 Code Description Modifier [id number] - WC PHYS LEVEL 4 - EST PT 25 ICD-10 Diagnosis Description L97.322 Non-pressure chronic ulcer of left ankle with fat layer exposed L97.812 Non-pressure chronic ulcer of other part of right lower leg with fat  layer exposed I87.2 Venous insufficiency (chronic) (peripheral) R60.0 Localized edema Quantity: 1 : [id number] - WC PHYS DEBR WO ANESTH 20 SQ CM ICD-10 Diagnosis Description 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ed Quantity: 1 Electronic Signature(s) Signed: 02/15/2023 12:28:44 PM By: Duanne Guess MD  FACS Entered By: Duanne Guess  on 02/15/2023 12:28:44

## 2023-02-18 ENCOUNTER — Ambulatory Visit: Payer: Medicare Other | Admitting: Physician Assistant

## 2023-02-18 VITALS — BP 90/54 | HR 87 | Temp 97.6°F | Resp 18 | Ht 72.0 in | Wt 197.0 lb

## 2023-02-18 DIAGNOSIS — I87319 Chronic venous hypertension (idiopathic) with ulcer of unspecified lower extremity: Secondary | ICD-10-CM

## 2023-02-18 DIAGNOSIS — L97909 Non-pressure chronic ulcer of unspecified part of unspecified lower leg with unspecified severity: Secondary | ICD-10-CM

## 2023-02-18 DIAGNOSIS — I872 Venous insufficiency (chronic) (peripheral): Secondary | ICD-10-CM

## 2023-02-18 NOTE — Progress Notes (Signed)
Office Note     CC:  follow up Requesting Provider:  Duanne Guess, MD  HPI: [name] is a 89 y.o. (14-Nov-1931) male who presents for evaluation of right lower extremity edema compared to left.  Patient states he has a history of DVT in the right leg.  He is active with the wound clinic and currently receiving Unna boot changes to the right lower extremity due to venous ulcerations in the lower medial ankle.  He did have a venous ulceration of his left lower leg however this healed.  He is currently wearing compression on his left leg.  He takes care of his bedridden wife.  He denies tobacco use.  He denies any fevers, chills, nausea/vomiting.  He believes the wound in his right medial ankle is improving.   Past Medical History:  Diagnosis Date   ACTINIC KERATOSIS, HEAD 03/23/2008   ALLERGIC RHINITIS 03/27/2007   Arthritis    BENIGN PROSTATIC HYPERTROPHY, WITH URINARY OBSTRUCTION 10/16/2007   CAD 08/18/2007   "patient denies any issues with heart, does not see cardiologist"   Carpal tunnel syndrome, bilateral 09/04/2016   CARPAL TUNNEL SYNDROME, LEFT 04/13/2008   CHF (congestive heart failure) (HCC)    Chronic back pain    Chronic neck pain    Complication of anesthesia    left lower jaw teeth have been injured in the past and he has lost those, now has 2 loose front teeth due to a mouth injury on 01/02/20   DVT (deep venous thrombosis) (HCC)    ERECTILE DYSFUNCTION, ORGANIC 02/20/2010   Gout, unspecified 02/20/2010   Headache(784.0)    migraines hx of- started at age 41- none since 9   History of melanoma    HYPERLIPIDEMIA, WITH HIGH HDL 10/18/2008   HYPERTENSION 05/09/2007   sees Dr. 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   Patellar tendinitis 10/10/2009   Sciatic nerve pain    on Oxycodone as needed   SKIN CANCER, HX OF 03/27/2007   UNS ADVRS  EFF UNS RX MEDICINAL&BIOLOGICAL SBSTNC 08/18/2007   Urinary urgency     Past Surgical History:  Procedure Laterality Date   APPENDECTOMY  1969   APPLICATION OF WOUND VAC Left 01/19/2020   Procedure: Application Of Wound Vac;  Surgeon: Cammy Copa, MD;  Location: [facility] OR;  Service: Orthopedics;  Laterality: Left;   BLEPHAROPLASTY     CARPAL TUNNEL RELEASE Right    CARPAL TUNNEL RELEASE Left 12/27/2022   Procedure: CARPAL TUNNEL RELEASE WITH GANGLION CYST EXCISION;  Surgeon: Cammy Copa, MD;  Location: [facility] OR;  Service: Orthopedics;  Laterality: Left;   CHOLECYSTECTOMY     ESOPHAGOGASTRODUODENOSCOPY (EGD) WITH PROPOFOL N/A 04/26/2015   Procedure: ESOPHAGOGASTRODUODENOSCOPY (EGD) WITH PROPOFOL;  Surgeon: Louis Meckel, MD;  Location: [facility] ENDOSCOPY;  Service: Endoscopy;  Laterality: N/A;   EYE SURGERY     bilateral cataracts removed and vitrectomies   JOINT REPLACEMENT Right    knee   KNEE ARTHROSCOPY     KYPHOPLASTY N/A 06/10/2020   Procedure: Thoracic Twelve, Lumbar One, Lumbar Two Kyphoplasty;  Surgeon: Coletta Memos, MD;  Location: [facility] OR;  Service: Neurosurgery;  Laterality: N/A;  3C/RM 21   KYPHOPLASTY N/A 09/07/2020   Procedure: Lumbar three KYPHOPLASTY;  Surgeon: Coletta Memos, MD;  Location: [facility] OR;  Service: Neurosurgery;  Laterality: N/A;  LAMINECTOMY     LATERAL FUSION LUMBAR SPINE, TRANSVERSE     LEFT HEART CATH AND CORONARY ANGIOGRAPHY N/A 07/27/2022   Procedure: LEFT HEART CATH AND CORONARY ANGIOGRAPHY;  Surgeon: Orpah Cobb, MD;  Location: [facility] INVASIVE CV LAB;  Service: Cardiovascular;  Laterality: N/A;   LUMBAR LAMINECTOMY/DECOMPRESSION MICRODISCECTOMY Left 11/03/2012   Procedure: LUMBAR LAMINECTOMY/DECOMPRESSION MICRODISCECTOMY 1 LEVEL;  Surgeon: Hewitt Shorts, MD;  Location: [facility] NEURO ORS;  Service: Neurosurgery;  Laterality: Left;  LEFT L5S1 laminotomy foraminotomy and possible microdiskectomy   MELANOMA EXCISION     scalp   NASAL SINUS SURGERY     ORIF  PATELLA Left 01/19/2020   Procedure: LEFT OPEN REDUCTION INTERNAL (ORIF) FIXATION PATELLA WITH AUTO GRAFTING USING HAMSTRING REINFORCEMENT;  Surgeon: Cammy Copa, MD;  Location: [facility] OR;  Service: Orthopedics;  Laterality: Left;   POSTERIOR CERVICAL FUSION/FORAMINOTOMY Right 10/01/2012   Procedure: POSTERIOR CERVICAL FUSION/FORAMINOTOMY LEVEL 1;  Surgeon: Hewitt Shorts, MD;  Location: [facility] NEURO ORS;  Service: Neurosurgery;  Laterality: Right;  Right Cervical seven thoracic one Cervical laminectomy/foraminotomy with posterior cervical arthrodesis    POSTERIOR LUMBAR FUSION N/A 05/23/2013   Procedure: exploration of lumbar wound, explantation of left interbody implant.;  Surgeon: Hewitt Shorts, MD;  Location: [facility] OR;  Service: Neurosurgery;  Laterality: N/A;   RECTAL SURGERY     Fissure   TOTAL KNEE ARTHROPLASTY Left 05/07/2019   Procedure: LEFT TOTAL KNEE ARTHROPLASTY;  Surgeon: Cammy Copa, MD;  Location: [facility] OR;  Service: Orthopedics;  Laterality: Left;   VITRECTOMY     right and left 2013    Social History   Socioeconomic History   Marital status: Married    Spouse name: Not on file   Number of children: 1   Years of education: 12   Highest education level: Not on file  Occupational History   Occupation: Retired  Tobacco Use   Smoking status: Former    Packs/day: 1.00    Years: 33.00    Additional pack years: 0.00    Total pack years: 33.00    Types: Cigarettes    Quit date: 09/29/1981    Years since quitting: 41.4   Smokeless tobacco: Never  Vaping Use   Vaping Use: Never used  Substance and Sexual Activity   Alcohol use: Yes    Alcohol/week: 21.0 standard drinks of alcohol    Types: 21 Shots of liquor per week    Comment: finishes 1/2L per week dark rum.- 3 shots of liquor a day   Drug use: No   Sexual activity: Not Currently    Partners: Female    Comment: Married  Other Topics Concern   Not on file  Social History Narrative   Lives at home w/ his  wife   Left-handed   Caffeine: 1 cup coffee daily   1 son alive, 2 sons deceased   Retired Psychologist, occupational, Chartered certified accountant.    Education: GED   Social Determinants of Health   Financial Resource Strain: Low Risk  (07/25/2022)   Overall Financial Resource Strain (CARDIA)    Difficulty of Paying Living Expenses: Not hard at all  Food Insecurity: No Food Insecurity (07/30/2022)   Hunger Vital Sign    Worried About Running Out of Food in the Last Year: Never true    Ran Out of Food in the Last Year: Never true  Transportation Needs: No Transportation Needs (07/30/2022)   PRAPARE - Transportation    Lack of Transportation (Medical): No    Lack  of Transportation (Non-Medical): No  Physical Activity: Inactive (11/20/2021)   Exercise Vital Sign    Days of Exercise per Week: 0 days    Minutes of Exercise per Session: 0 min  Stress: No Stress Concern Present (11/20/2021)   [employer] of Occupational Health - Occupational Stress Questionnaire    Feeling of Stress : Not at all  Social Connections: Moderately Isolated (11/20/2021)   Social Connection and Isolation Panel [NHANES]    Frequency of Communication with Friends and Family: Twice a week    Frequency of Social Gatherings with Friends and Family: Twice a week    Attends Religious Services: Never    Database administrator or Organizations: No    Attends Banker Meetings: Never    Marital Status: Married  Catering manager Violence: Not At Risk (07/24/2022)   Humiliation, Afraid, Rape, and Kick questionnaire    Fear of Current or Ex-Partner: No    Emotionally Abused: No    Physically Abused: No    Sexually Abused: No    Family History  Problem Relation Age of Onset   Dementia Mother    Anemia Father    Heart disease Father    Stroke Sister        ICH   Lung disease Sister     Current Outpatient Medications  Medication Sig Dispense Refill   amoxicillin-clavulanate (AUGMENTIN) 875-125 MG tablet Take 1 tablet by mouth 2  (two) times daily. 20 tablet 0   apixaban (ELIQUIS) 5 MG TABS tablet Take 1 tablet (5 mg total) by mouth 2 (two) times daily. 180 tablet 1   aspirin EC 81 MG tablet Take 1 tablet (81 mg total) by mouth daily. Swallow whole. 30 tablet 0   clotrimazole (LOTRIMIN) 1 % cream APPLY TO AFFECTED AREA TWICE A DAY (Patient taking differently: Apply 1 Application topically 2 (two) times daily as needed (for dryness).) 30 g 0   doxycycline (VIBRA-TABS) 100 MG tablet Take 1 tablet (100 mg total) by mouth 2 (two) times daily. 14 tablet 0   fluticasone (FLONASE) 50 MCG/ACT nasal spray Place 1 spray into both nostrils daily as needed for allergies.     furosemide (LASIX) 20 MG tablet Take 1 tablet (20 mg total) by mouth daily. 90 tablet 3   isosorbide mononitrate (IMDUR) 30 MG 24 hr tablet TAKE 1 TABLET BY MOUTH EVERY DAY 90 tablet 1   losartan (COZAAR) 50 MG tablet Take 1 tablet (50 mg total) by mouth daily. 90 tablet 3   metoprolol succinate (TOPROL XL) 50 MG 24 hr tablet Take 1 tablet (50 mg total) by mouth daily. 90 tablet 3   Oxycodone HCl 10 MG TABS Take 10-20 mg by mouth See admin instructions. Take 20 mg by mouth in the morning and evening. May take an additional 10 mg once a day around noontime as needed for unresolved pain.     predniSONE (DELTASONE) 20 MG tablet Take 2 tablets (40 mg total) by mouth daily with breakfast. 14 tablet 0   SYMPROIC 0.2 MG TABS Take 0.2 mg by mouth in the morning.     tamsulosin (FLOMAX) 0.4 MG CAPS capsule TAKE 1 CAPSULE BY MOUTH EVERY DAY (Patient taking differently: Take 0.4 mg by mouth every evening.) 90 capsule 1   triamcinolone cream (KENALOG) 0.1 % Apply 1 Application topically 2 (two) times daily as needed (rough/irritated skin).     amLODipine (NORVASC) 5 MG tablet TAKE 1 TABLET (5 MG TOTAL) BY MOUTH DAILY. 90  tablet 1   No current facility-administered medications for this visit.    Allergies  Allergen Reactions   Amlodipine Swelling and Other (See Comments)     Ankle swelling   Lyrica [Pregabalin] Swelling and Other (See Comments)    Hands and feet swell   Gabapentin Other (See Comments)    Lower extremity edema- knees and feet     REVIEW OF SYSTEMS:   [X]  denotes positive finding, [ ]  denotes negative finding Cardiac  Comments:  Chest pain or chest pressure:    Shortness of breath upon exertion:    Short of breath when lying flat:    Irregular heart rhythm:        Vascular    Pain in calf, thigh, or hip brought on by ambulation:    Pain in feet at night that wakes you up from your sleep:     Blood clot in your veins:    Leg swelling:         Pulmonary    Oxygen at home:    Productive cough:     Wheezing:         Neurologic    Sudden weakness in arms or legs:     Sudden numbness in arms or legs:     Sudden onset of difficulty speaking or slurred speech:    Temporary loss of vision in one eye:     Problems with dizziness:         Gastrointestinal    Blood in stool:     Vomited blood:         Genitourinary    Burning when urinating:     Blood in urine:        Psychiatric    Major depression:         Hematologic    Bleeding problems:    Problems with blood clotting too easily:        Skin    Rashes or ulcers:        Constitutional    Fever or chills:      PHYSICAL EXAMINATION:  Vitals:   02/18/23 1009  BP: (!) 90/54  Pulse: 87  Resp: 18  Temp: 97.6 F (36.4 C)  TempSrc: Temporal  SpO2: 92%  Weight: 197 lb (89.4 kg)  Height: 6' (1.829 m)    General:  WDWN in NAD; vital signs documented above Gait: Not observed HENT: WNL, normocephalic Pulmonary: normal non-labored breathing , without Rales, rhonchi,  wheezing Cardiac: regular HR Abdomen: soft, NT, no masses Skin: without rashes Vascular Exam/Pulses: palpable 1+ R DP and 2+ L DP Extremities: Pitting edema of bilateral lower extremities right worse than left.  Edema extends to the level of the knee.  He has pigmentation changes to both  shins. Musculoskeletal: no muscle wasting or atrophy  Neurologic: A&O X 3 Psychiatric:  The pt has Normal affect.    Non-Invasive Vascular Imaging:    Venous Reflux Times  +--------------+---------+------+-----------+------------+--------+  RIGHT        Reflux NoRefluxReflux TimeDiameter cmsComments                          Yes                                   +--------------+---------+------+-----------+------------+--------+  CFV  yes   >1 second                       +--------------+---------+------+-----------+------------+--------+  FV mid        no                                              +--------------+---------+------+-----------+------------+--------+  Popliteal    no                                              +--------------+---------+------+-----------+------------+--------+  GSV at [facility]              yes    >500 ms      0.86              +--------------+---------+------+-----------+------------+--------+  GSV prox thigh          yes    >500 ms      0.89              +--------------+---------+------+-----------+------------+--------+  GSV mid thigh           yes    >500 ms      0.66              +--------------+---------+------+-----------+------------+--------+  GSV dist thigh          yes    >500 ms      0.57              +--------------+---------+------+-----------+------------+--------+  GSV at knee             yes    >500 ms      0.58              +--------------+---------+------+-----------+------------+--------+  GSV prox calf           yes    >500 ms      0.63              +--------------+---------+------+-----------+------------+--------+  GSV mid calf  no                            0.64              +--------------+---------+------+-----------+------------+--------+  SSV Pop Fossa no                            0.39               +--------------+---------+------+-----------+------------+--------+  SSV prox calf           yes    >500 ms      0.41              +--------------+---------+------+-----------+------------+--------+  SSV mid calf            yes    >500 ms      0.29              +--------------+---------+------+-----------+------------+--------+  AASV O                  yes    >500 ms      0.42              +--------------+---------+------+-----------+------------+--------+  AASV P        no                                    NV        +--------------+---------+------+-----------+------------+--------+     +--------------+---------+------+-----------+------------+--------+  LEFT         Reflux NoRefluxReflux TimeDiameter cmsComments                          Yes                                   +--------------+---------+------+-----------+------------+--------+  CFV          no                                              +--------------+---------+------+-----------+------------+--------+  FV mid        no                                              +--------------+---------+------+-----------+------------+--------+  Popliteal              yes   >1 second                       +--------------+---------+------+-----------+------------+--------+  GSV at [facility]    no                            0.31              +--------------+---------+------+-----------+------------+--------+  GSV prox thighno                            0.77              +--------------+---------+------+-----------+------------+--------+  GSV mid thigh no                            0.50              +--------------+---------+------+-----------+------------+--------+  GSV dist thighno                            0.53              +--------------+---------+------+-----------+------------+--------+  GSV at knee             yes    >500 ms      0.49               +--------------+---------+------+-----------+------------+--------+  GSV prox calf no                            0.46              +--------------+---------+------+-----------+------------+--------+  GSV mid calf  no  0.36              +--------------+---------+------+-----------+------------+--------+  SSV Pop Fossa no                            0.46              +--------------+---------+------+-----------+------------+--------+  SSV prox calf                               0.40              +--------------+---------+------+-----------+------------+--------+  SSV mid calf  no                            0.41              +--------------+---------+------+-----------+------------+--------+  AASV O                                              NV        +--------------+---------+------+-----------+------------+--------+         ASSESSMENT/PLAN:: 89 y.o. male here for evaluation of bilateral lower extremity edema right worse than left.  Also with venous ulceration of the right medial ankle  -[name] is an 89 year old male who is currently active with the wound clinic due to venous ulcerations.  He has since healed left ankle venous ulceration and is currently wearing compression.  He is still receiving Unna boot changes for right medial ankle ulceration.  He still has some weeping in this area however states this is much better compared to what it used to be.  He tries to elevate his legs as much as possible at home however he has to take care of his bedridden wife.  He tries to avoid prolonged sitting and standing.  Venous reflux study demonstrates areas of venous insufficiency of bilateral lower extremities.  He was negative for DVTs of bilateral lower extremities.  He may be a candidate for right lower extremity greater saphenous vein ablation based on finding of an incompetent and dilated GSV throughout the  thigh and including the saphenofemoral junction.  He also has deep insufficiency of bilateral lower extremities.  Unna boot was reapplied to the right lower extremity.  He will also use an ace to extend the compression above the knee into the thigh on a daily basis.  He will follow-up in 3 months with Dr. Myra Gianotti to be evaluated for laser ablation therapy.    It should also be noted that [name] was frustrated that he did not have a procedure on his vein today despite it being his initial visit.  I also discussed with the patient he will not have a procedure during his follow up visit either.  Instead, he will be evaluated by one of our vein surgeons to see if he would in fact be a good laser ablation candidate after 3 months of conservative management.    Emilie Rutter, PA-C [facility] [phone]  Clinic MD:   York Pellant

## 2023-02-20 NOTE — Progress Notes (Signed)
Jordan, [name] ([id number]) [id number] Page 1 of 10 Visit Report for 02/15/2023 Arrival Information Details Patient Name: Date of Service: [name] 02/15/2023 11:30 A M Medical Record Number: [id number] Patient Account Number: [id number] Date of Birth/Sex: Treating RN: 04-10-1932 (89 y.o. M) Primary Care Oracio Galen: Alcide Clever Other Clinician: Referring Mercedies Ganesh: Treating Emalia Witkop/Extender: Duanne Guess [place], MICHELLE Weeks in Treatment: 5 Visit Information History Since Last Visit All ordered tests and consults were completed: No Patient Arrived: Dan Humphreys Added or deleted any medications: No Arrival Time: 11:36 Any new allergies or adverse reactions: No Accompanied By: self Had a fall or experienced change in No Transfer Assistance: None activities of daily living that may affect Patient Identification Verified: Yes risk of falls: Secondary Verification Process Completed: Yes Signs or symptoms of abuse/neglect since last visito No Patient Requires Transmission-Based Precautions: No Hospitalized since last visit: No Patient Has Alerts: No Implantable device outside of the clinic excluding No cellular tissue based products placed in the center since last visit: Pain Present Now: No Electronic Signature(s) Signed: 02/15/2023 3:52:53 PM By: Dayton Scrape Entered By: Dayton Scrape on 02/15/2023 11:36:43 -------------------------------------------------------------------------------- Encounter Discharge Information Details Patient Name: Date of Service: [name], Jordan E. 02/15/2023 11:30 A M Medical Record Number: [id number] Patient Account Number: [id number] Date of Birth/Sex: Treating RN: [birth year] (89 y.o. Yates Decamp Primary Care Kelsey Durflinger: Alcide Clever Other Clinician: Referring Crystian Frith: Treating Chin Wachter/Extender: Duanne Guess [facility], MICHELLE Weeks in Treatment: 5 Encounter Discharge Information Items Post  Procedure Vitals Discharge Condition: Stable Temperature (F): 98.1 Ambulatory Status: Walker Pulse (bpm): 78 Discharge Destination: Home Respiratory Rate (breaths/min): 18 Transportation: Private Auto Blood Pressure (mmHg): 140/77 Accompanied By: self Schedule Follow-up Appointment: Yes Clinical Summary of Care: Patient Declined Electronic Signature(s) Signed: 02/20/2023 4:28:57 PM By: Brenton Grills Entered By: Brenton Grills on 02/15/2023 12:22:48 [name] ([id number]) [id number] Page 2 of 10 -------------------------------------------------------------------------------- Lower Extremity Assessment Details Patient Name: Date of Service: [name] 02/15/2023 11:30 A M Medical Record Number: [id number] Patient Account Number: [id number] Date of Birth/Sex: Treating RN: 10/22/1931 (89 y.o. Yates Decamp Primary Care Eshal Propps: Alcide Clever Other Clinician: Referring Idy Rawling: Treating Alannis Hsia/Extender: Duanne Guess [facility], MICHELLE Weeks in Treatment: 5 Edema Assessment Assessed: [Left: No] [Right: No] Edema: [Left: Yes] [Right: Yes] Calf Left: Right: Point of Measurement: From Medial Instep 35 cm 35.2 cm Ankle Left: Right: Point of Measurement: From Medial Instep 22.4 cm 23.4 cm Knee To Floor Left: Right: From Medial Instep 44 cm 44 cm Vascular Assessment Pulses: Dorsalis Pedis Palpable: [Left:Yes] [Right:Yes] Electronic Signature(s) Signed: 02/20/2023 4:28:57 PM By: Brenton Grills Entered By: Brenton Grills on 02/15/2023 12:19:47 -------------------------------------------------------------------------------- Multi Wound Chart Details Patient Name: Date of Service: [name], Jordan E. 02/15/2023 11:30 A M Medical Record Number: [id number] Patient Account Number: [id number] Date of Birth/Sex: Treating RN: [birth year] (89 y.o. M) Primary Care Yousif Edelson: Alcide Clever Other Clinician: Referring Kharis Lapenna: Treating  Randen Kauth/Extender: Duanne Guess [facility], MICHELLE Weeks in Treatment: 5 Vital Signs Height(in): 72 Pulse(bpm): 91 Weight(lbs): 198 Blood Pressure(mmHg): 125/61 Body Mass Index(BMI): 26.9 Temperature(F): 98.1 Respiratory Rate(breaths/min): 20 [1:Photos:] [N/A:N/A [id number] Page 3 of 10] Left, Medial Lower Leg Right, Medial Ankle N/A Wound Location: Gradually Appeared Gradually Appeared N/A Wounding Event: Venous Leg Ulcer Venous Leg Ulcer N/A Primary Etiology: Lymphedema N/A N/A Secondary Etiology: Cataracts, Congestive Heart Failure, Cataracts, Congestive Heart Failure, N/A Comorbid History: Coronary Artery Disease, Coronary Artery Disease, Hypertension, Peripheral Venous Hypertension, Peripheral Venous Disease, Osteoarthritis, Neuropathy Disease, Osteoarthritis, Neuropathy 05/12/2022 02/12/2023 N/A  Date Acquired: 5 0 N/A Weeks of Treatment: Open Open N/A Wound Status: No No N/A Wound Recurrence: 0.1x0.1x0.1 2.1x4.8x0.1 N/A Measurements L x W x D (cm) 0.008 7.917 N/A A (cm) : rea 0.001 0.792 N/A Volume (cm) : 99.90% N/A N/A % Reduction in A rea: 99.90% N/A N/A % Reduction in Volume: Full Thickness Without Exposed Full Thickness Without Exposed N/A Classification: Support Structures Support Structures Medium Medium N/A Exudate A mount: Serosanguineous Serosanguineous N/A Exudate Type: red, brown red, brown N/A Exudate Color: Flat and Intact Distinct, outline attached N/A Wound Margin: Small (1-33%) Medium (34-66%) N/A Granulation A mount: Pink Red, Pink N/A Granulation Quality: Large (67-100%) Medium (34-66%) N/A Necrotic A mount: Eschar, Adherent Slough Adherent Slough N/A Necrotic Tissue: Fat Layer (Subcutaneous Tissue): Yes Fat Layer (Subcutaneous Tissue): Yes N/A Exposed Structures: Fascia: No Tendon: No Muscle: No Joint: No Bone: No Large (67-100%) N/A N/A Epithelialization: N/A Debridement - Selective/Open  Wound N/A Debridement: Pre-procedure Verification/Time Out N/A 12:14 N/A Taken: N/A Slough N/A Tissue Debrided: N/A Non-Viable Tissue N/A Level: N/A 7.91 N/A Debridement A (sq cm): rea N/A Curette N/A Instrument: N/A Minimum N/A Bleeding: N/A Pressure N/A Hemostasis A chieved: N/A 0 N/A Procedural Pain: N/A 0 N/A Post Procedural Pain: N/A Procedure was tolerated well N/A Debridement Treatment Response: N/A 2.1x4.8x0.1 N/A Post Debridement Measurements L x W x D (cm) N/A 0.792 N/A Post Debridement Volume: (cm) No Abnormalities Noted Scarring: Yes N/A Periwound Skin Texture: No Abnormalities Noted N/A Periwound Skin Moisture: Hemosiderin Staining: Yes Hemosiderin Staining: Yes N/A Periwound Skin Color: No Abnormality No Abnormality N/A Temperature: N/A Yes N/A Tenderness on Palpation: N/A Debridement N/A Procedures Performed: Treatment Notes Wound #1 (Lower Leg) Wound Laterality: Left, Medial Cleanser Soap and Water Discharge Instruction: May shower and wash wound with dial antibacterial soap and water prior to dressing change. Wound Cleanser Discharge Instruction: Cleanse the wound with wound cleanser prior to applying a clean dressing using gauze sponges, not tissue or cotton balls. Peri-Wound Care Sween Lotion (Moisturizing lotion) Discharge Instruction: Apply moisturizing lotion as directed Topical Primary Dressing Maxorb Extra Ag+ Alginate Dressing, 2x2 (in/in) Discharge Instruction: Apply to wound bed as instructed Secondary Dressing TAYLER, LASSEN ([id number]) [id number] Page 4 of 10 Woven Gauze Sponge, Non-Sterile 4x4 in Discharge Instruction: Apply over primary dressing as directed. Secured With Compression Wrap Urgo K2 Lite, (equivalent to a 3 layer) two layer compression system, regular Discharge Instruction: Apply Urgo K2 Lite as directed (alternative to 3 layer compression). Compression Stockings Jobst Farrow Wrap  4000 Quantity: 1 Left Leg Compression Amount: 30-40 mmHg Discharge Instruction: Apply Renee Pain daily as instructed. Apply first thing in the morning, remove at night before bed. Add-Ons Wound #2 (Ankle) Wound Laterality: Right, Medial Cleanser Soap and Water Discharge Instruction: May shower and wash wound with dial antibacterial soap and water prior to dressing change. Wound Cleanser Discharge Instruction: Cleanse the wound with wound cleanser prior to applying a clean dressing using gauze sponges, not tissue or cotton balls. Peri-Wound Care Sween Lotion (Moisturizing lotion) Discharge Instruction: Apply moisturizing lotion as directed Topical Primary Dressing Maxorb Extra Ag+ Alginate Dressing, 2x2 (in/in) Discharge Instruction: Apply to wound bed as instructed Secondary Dressing Woven Gauze Sponge, Non-Sterile 4x4 in Discharge Instruction: Apply over primary dressing as directed. Secured With Compression Wrap Urgo K2 Lite, (equivalent to a 3 layer) two layer compression system, regular Discharge Instruction: Apply Urgo K2 Lite as directed (alternative to 3 layer compression). Compression Stockings Jobst Farrow Wrap 4000 Quantity: 1 Right Leg Compression  Amount: 30-40 mmHg Discharge Instruction: Apply Renee Pain daily as instructed. Apply first thing in the morning, remove at night before bed. Add-Ons Electronic Signature(s) Signed: 02/15/2023 12:24:47 PM By: Duanne Guess MD FACS Entered By: Duanne Guess on 02/15/2023 12:24:47 -------------------------------------------------------------------------------- Multi-Disciplinary Care Plan Details Patient Name: Date of Service: [name], Merick E. 02/15/2023 11:30 A M Medical Record Number: [id number] Patient Account Number: [id number] Date of Birth/Sex: Treating RN: Jan 01, 1932 (89 y.o. Yates Decamp Primary Care Lattie Cervi: Alcide Clever Other Clinician: Referring Venita Seng: Treating Gesenia Bantz/Extender: Duanne Guess [facility], MICHELLE Weeks in Treatment: [address] RAYGEN, DAHM E ([id number]) [id number] Page 5 of 10 Multidisciplinary Care Plan reviewed with physician Active Inactive Venous Leg Ulcer Nursing Diagnoses: Knowledge deficit related to disease process and management Potential for venous Insuffiency (use before diagnosis confirmed) Goals: Patient will maintain optimal edema control Date Initiated: 01/11/2023 Target Resolution Date: 02/08/2023 Goal Status: Active Interventions: Assess peripheral edema status every visit. Compression as ordered Provide education on venous insufficiency Treatment Activities: Therapeutic compression applied : 01/11/2023 Notes: Wound/Skin Impairment Nursing Diagnoses: Impaired tissue integrity Knowledge deficit related to ulceration/compromised skin integrity Goals: Patient/caregiver will verbalize understanding of skin care regimen Date Initiated: 01/11/2023 Target Resolution Date: 02/08/2023 Goal Status: Active Ulcer/skin breakdown will have a volume reduction of 30% by week 4 Date Initiated: 01/11/2023 Date Inactivated: 02/07/2023 Target Resolution Date: 02/08/2023 Goal Status: Met Interventions: Assess patient/caregiver ability to obtain necessary supplies Assess patient/caregiver ability to perform ulcer/skin care regimen upon admission and as needed Assess ulceration(s) every visit Provide education on ulcer and skin care Treatment Activities: Skin care regimen initiated : 01/11/2023 Topical wound management initiated : 01/11/2023 Notes: Electronic Signature(s) Signed: 02/20/2023 4:28:57 PM By: Brenton Grills Entered By: Brenton Grills on 02/15/2023 12:21:05 -------------------------------------------------------------------------------- Pain Assessment Details Patient Name: Date of Service: [name], Matheau E. 02/15/2023 11:30 A M Medical Record Number: [id number] Patient Account Number: [id number] Date of Birth/Sex:  Treating RN: 1932/07/22 (89 y.o. M) Primary Care Eisa Conaway: Alcide Clever Other Clinician: Referring Kilani Joffe: Treating Deshan Hemmelgarn/Extender: Duanne Guess [facility], MICHELLE Weeks in Treatment: [address] PERRIN, GENS E ([id number]) [id number] Page 6 of 10 Location of Pain Severity and Description of Pain Patient Has Paino Yes Site Locations Rate the pain. Current Pain Level: 4 Worst Pain Level: 10 Least Pain Level: 0 Tolerable Pain Level: 2 Pain Management and Medication Current Pain Management: Electronic Signature(s) Signed: 02/15/2023 3:52:53 PM By: Dayton Scrape Entered By: Dayton Scrape on 02/15/2023 11:37:21 -------------------------------------------------------------------------------- Patient/Caregiver Education Details Patient Name: Date of Service: [name], [name] 7/5/2024andnbsp11:30 A M Medical Record Number: [id number] Patient Account Number: [id number] Date of Birth/Gender: Treating RN: [birth year] (89 y.o. Yates Decamp Primary Care Physician: Alcide Clever Other Clinician: Referring Physician: Treating Physician/Extender: Duanne Guess [facility], MICHELLE Weeks in Treatment: 5 Education Assessment Education Provided To: Patient Education Topics Provided Wound/Skin Impairment: Methods: Explain/Verbal Responses: State content correctly Electronic Signature(s) Signed: 02/20/2023 4:28:57 PM By: Brenton Grills Entered By: Brenton Grills on 02/15/2023 12:21:22 -------------------------------------------------------------------------------- Wound Assessment Details Patient Name: Date of Service: [name], Kelten E. 02/15/2023 11:30 A WILHELM, GANAWAY E ([id number]) [id number] Page 7 of 10 Medical Record Number: [id number] Patient Account Number: [id number] Date of Birth/Sex: Treating RN: 04-29-1932 (89 y.o. M) Primary Care Nashid Pellum: Alcide Clever Other Clinician: Referring  Ane Conerly: Treating Jalisa Sacco/Extender: Duanne Guess [facility], MICHELLE Weeks in Treatment: 5 Wound Status Wound Number: 1 Primary Venous Leg Ulcer Etiology: Wound Location: Left, Medial Lower Leg Secondary Lymphedema Wounding Event: Gradually Appeared Etiology: Date Acquired: 05/12/2022 Wound Open  Weeks Of Treatment: 5 Status: Clustered Wound: No Comorbid Cataracts, Congestive Heart Failure, Coronary Artery Disease, History: Hypertension, Peripheral Venous Disease, Osteoarthritis, Neuropathy Photos Wound Measurements Length: (cm) 0.1 Width: (cm) 0.1 Depth: (cm) 0.1 Area: (cm) 0.008 Volume: (cm) 0.001 % Reduction in Area: 99.9% % Reduction in Volume: 99.9% Epithelialization: Large (67-100%) Wound Description Classification: Full Thickness Without Exposed Support Structures Wound Margin: Flat and Intact Exudate Amount: Medium Exudate Type: Serosanguineous Exudate Color: red, brown Foul Odor After Cleansing: No Slough/Fibrino Yes Wound Bed Granulation Amount: Small (1-33%) Exposed Structure Granulation Quality: Pink Fascia Exposed: No Necrotic Amount: Large (67-100%) Fat Layer (Subcutaneous Tissue) Exposed: Yes Necrotic Quality: Eschar, Adherent Slough Tendon Exposed: No Muscle Exposed: No Joint Exposed: No Bone Exposed: No Periwound Skin Texture Texture Color No Abnormalities Noted: Yes No Abnormalities Noted: No Hemosiderin Staining: Yes Moisture No Abnormalities Noted: Yes Temperature / Pain Temperature: No Abnormality Treatment Notes Wound #1 (Lower Leg) Wound Laterality: Left, Medial Cleanser Soap and Water Discharge Instruction: May shower and wash wound with dial antibacterial soap and water prior to dressing change. Wound Cleanser Discharge Instruction: Cleanse the wound with wound cleanser prior to applying a clean dressing using gauze sponges, not tissue or cotton balls. Peri-Wound Care Sween Lotion (Moisturizing lotion) MACRAE, WIEGMAN E  ([id number]) [id number] Page 8 of 10 Discharge Instruction: Apply moisturizing lotion as directed Topical Primary Dressing Maxorb Extra Ag+ Alginate Dressing, 2x2 (in/in) Discharge Instruction: Apply to wound bed as instructed Secondary Dressing Woven Gauze Sponge, Non-Sterile 4x4 in Discharge Instruction: Apply over primary dressing as directed. Secured With Compression Wrap Urgo K2 Lite, (equivalent to a 3 layer) two layer compression system, regular Discharge Instruction: Apply Urgo K2 Lite as directed (alternative to 3 layer compression). Compression Stockings Jobst Farrow Wrap 4000 Quantity: 1 Left Leg Compression Amount: 30-40 mmHg Discharge Instruction: Apply Renee Pain daily as instructed. Apply first thing in the morning, remove at night before bed. Add-Ons Electronic Signature(s) Signed: 02/15/2023 3:52:53 PM By: Dayton Scrape Entered By: Dayton Scrape on 02/15/2023 11:45:14 -------------------------------------------------------------------------------- Wound Assessment Details Patient Name: Date of Service: [name], Jordan E. 02/15/2023 11:30 A M Medical Record Number: [id number] Patient Account Number: [id number] Date of Birth/Sex: Treating RN: 1932-03-04 (89 y.o. Yates Decamp Primary Care Ardeth Repetto: Alcide Clever Other Clinician: Referring Keviana Guida: Treating Blong Busk/Extender: Duanne Guess [facility], MICHELLE Weeks in Treatment: 5 Wound Status Wound Number: 2 Primary Venous Leg Ulcer Etiology: Wound Location: Right, Medial Ankle Wound Open Wounding Event: Gradually Appeared Status: Date Acquired: 02/12/2023 Comorbid Cataracts, Congestive Heart Failure, Coronary Artery Disease, Weeks Of Treatment: 0 History: Hypertension, Peripheral Venous Disease, Osteoarthritis, Clustered Wound: No Neuropathy Photos Wound Measurements Length: (cm) 2.1 Width: (cm) 4.8 Depth: (cm) 0.1 Area: (cm) 7.917 Volume: (cm) 0.792 Svec, Victorious E  ([id number]) % Reduction in Area: % Reduction in Volume: Tunneling: No Undermining: No [id number] Page 9 of 10 Wound Description Classification: Full Thickness Without Exposed Support Structures Wound Margin: Distinct, outline attached Exudate Amount: Medium Exudate Type: Serosanguineous Exudate Color: red, brown Foul Odor After Cleansing: No Slough/Fibrino No Wound Bed Granulation Amount: Medium (34-66%) Exposed Structure Granulation Quality: Red, Pink Fat Layer (Subcutaneous Tissue) Exposed: Yes Necrotic Amount: Medium (34-66%) Necrotic Quality: Adherent Slough Periwound Skin Texture Texture Color No Abnormalities Noted: No No Abnormalities Noted: No Scarring: Yes Hemosiderin Staining: Yes Moisture Temperature / Pain No Abnormalities Noted: No Temperature: No Abnormality Tenderness on Palpation: Yes Treatment Notes Wound #2 (Ankle) Wound Laterality: Right, Medial Cleanser Soap and Water Discharge Instruction: May shower and wash wound with dial  antibacterial soap and water prior to dressing change. Wound Cleanser Discharge Instruction: Cleanse the wound with wound cleanser prior to applying a clean dressing using gauze sponges, not tissue or cotton balls. Peri-Wound Care Sween Lotion (Moisturizing lotion) Discharge Instruction: Apply moisturizing lotion as directed Topical Primary Dressing Maxorb Extra Ag+ Alginate Dressing, 2x2 (in/in) Discharge Instruction: Apply to wound bed as instructed Secondary Dressing Woven Gauze Sponge, Non-Sterile 4x4 in Discharge Instruction: Apply over primary dressing as directed. Secured With Compression Wrap Urgo K2 Lite, (equivalent to a 3 layer) two layer compression system, regular Discharge Instruction: Apply Urgo K2 Lite as directed (alternative to 3 layer compression). Compression Stockings Jobst Farrow Wrap 4000 Quantity: 1 Right Leg Compression Amount: 30-40 mmHg Discharge Instruction: Apply  Renee Pain daily as instructed. Apply first thing in the morning, remove at night before bed. Add-Ons Electronic Signature(s) Signed: 02/20/2023 4:28:57 PM By: Brenton Grills Entered By: Brenton Grills on 02/15/2023 12:00:11 Vitals Details -------------------------------------------------------------------------------- [name] ([id number]) [id number] Page 10 of 10 Patient Name: Date of Service: [place] [name] 02/15/2023 11:30 A M Medical Record Number: [id number] Patient Account Number: [id number] Date of Birth/Sex: Treating RN: 02/04/1932 (89 y.o. M) Primary Care Pearce Jordan: Alcide Clever Other Clinician: Referring Jaysen Wey: Treating Chauncy Mangiaracina/Extender: Duanne Guess [facility], MICHELLE Weeks in Treatment: 5 Vital Signs Time Taken: 11:36 Temperature (F): 98.1 Height (in): 72 Pulse (bpm): 91 Weight (lbs): 198 Respiratory Rate (breaths/min): 20 Body Mass Index (BMI): 26.9 Blood Pressure (mmHg): 125/61 Reference Range: 80 - 120 mg / dl Electronic Signature(s) Signed: 02/20/2023 4:28:57 PM By: Brenton Grills Entered By: Brenton Grills on 02/15/2023 11:50:45

## 2023-02-22 ENCOUNTER — Encounter (HOSPITAL_BASED_OUTPATIENT_CLINIC_OR_DEPARTMENT_OTHER): Payer: Medicare Other | Admitting: General Surgery

## 2023-02-22 DIAGNOSIS — L97812 Non-pressure chronic ulcer of other part of right lower leg with fat layer exposed: Secondary | ICD-10-CM | POA: Diagnosis not present

## 2023-02-26 ENCOUNTER — Other Ambulatory Visit: Payer: Self-pay | Admitting: Internal Medicine

## 2023-02-27 NOTE — Progress Notes (Signed)
[name], [name] ([id number]) [id number] Page 1 of 9 Visit Report for 02/22/2023 Chief Complaint Document Details Patient Name: Date of Service: [place] 02/22/2023 10:00 A M Medical Record Number: [id number] Patient Account Number: [id number] Date of Birth/Sex: Treating RN: 1931-09-26 (89 y.o. M) Primary Care Provider: Alcide Clever Other Clinician: Referring Provider: Treating Provider/Extender: Duanne Guess [facility], MICHELLE Weeks in Treatment: 6 Information Obtained from: Patient Chief Complaint Patient presents for treatment of an open ulcer due to venous insufficiency Electronic Signature(s) Signed: 02/22/2023 10:33:10 AM By: Duanne Guess MD FACS Entered By: Duanne Guess on 02/22/2023 10:33:10 -------------------------------------------------------------------------------- Debridement Details Patient Name: Date of Service: [name], Jordan E. 02/22/2023 10:00 A M Medical Record Number: [id number] Patient Account Number: [id number] Date of Birth/Sex: Treating RN: [birth year] (89 y.o. Yates Decamp Primary Care Provider: Alcide Clever Other Clinician: Referring Provider: Treating Provider/Extender: Duanne Guess [facility], MICHELLE Weeks in Treatment: 6 Debridement Performed for Assessment: Wound #2 Right,Medial Ankle Performed By: Physician Duanne Guess, MD Debridement Type: Debridement Severity of Tissue Pre Debridement: Fat layer exposed Level of Consciousness (Pre-procedure): Awake and Alert Pre-procedure Verification/Time Out Yes - 10:30 Taken: Start Time: 10:32 Pain Control: Lidocaine 4% T opical Solution Percent of Wound Bed Debrided: 100% T Area Debrided (cm): otal 1.26 Tissue and other material debrided: Viable, Non-Viable, Slough, Slough Level: Non-Viable Tissue Debridement Description: Selective/Open Wound Instrument: Curette Bleeding: Minimum Hemostasis Achieved: Pressure End Time:  10:35 Procedural Pain: 0 Post Procedural Pain: 0 Response to Treatment: Procedure was tolerated well Level of Consciousness (Post- Awake and Alert procedure): Post Debridement Measurements of Total Wound Length: (cm) 1.6 Width: (cm) 1 Depth: (cm) 0.1 Volume: (cm) 0.126 Character of Wound/Ulcer Post Debridement: Improved YAHYE, SIEBERT E ([id number]) [id number] Page 2 of 9 Severity of Tissue Post Debridement: Fat layer exposed Post Procedure Diagnosis Same as Pre-procedure Notes scribed for Dr. Lady Gary by Brenton Grills RN Electronic Signature(s) Signed: 02/22/2023 11:13:08 AM By: Duanne Guess MD FACS Signed: 02/27/2023 7:59:22 AM By: Brenton Grills Entered By: Brenton Grills on 02/22/2023 10:32:38 -------------------------------------------------------------------------------- HPI Details Patient Name: Date of Service: [name], Jordan E. 02/22/2023 10:00 A M Medical Record Number: [id number] Patient Account Number: [id number] Date of Birth/Sex: Treating RN: 1932-02-26 (89 y.o. M) Primary Care Provider: Alcide Clever Other Clinician: Referring Provider: Treating Provider/Extender: Duanne Guess [facility], MICHELLE Weeks in Treatment: 6 History of Present Illness HPI Description: ADMISSION 01/11/2023 This is an 89 year old nondiabetic with a history of chronic venous insufficiency, coronary artery disease, congestive heart failure, and lower extremity edema. He has historically been followed by podiatry for various ulcers and calluses on his feet. When he was seen in the middle of April, he was noted to have an ulcer starting on his left ankle. He was prescribed a course of cephalexin and advised to apply Betadine to the wound with an Ace wrap. He was referred to the wound care center for further evaluation and management. Apparently the wound closed of its own accord prior to his appointment with [place], but subsequently reopened. The notes from  podiatry do indicate that compression and elevation were discussed as means to help heal ulceration and avoid future episodes. After the wound reopened, he was seen again and prescribed a course of Augmentin. He is now here for his wound care appointment. 01/21/2023: The wound is smaller and more superficial. It is fairly clean with just a little bit of light slough on the surface. His venous reflux studies are scheduled for this coming Thursday. 01/29/2023: The wound is  smaller again today and very superficial. There is minimal slough present with a little eschar around the edges. His venous reflux studies were completed, and as expected, he has severe venous reflux. 02/07/2023: His wound is very nearly closed. He has an appointment coming up with vascular surgery on July 8. He indicated a callus on the bottom of his left foot that is bothering him today. 02/15/2023: There is just a pinhole opening remaining on the left, but unfortunately, he has had a new ulceration occur on the medial aspect of his right lower leg. The fat layer is exposed. He does have 2+ pitting edema in this leg. He says he noted fluid coming from the site a couple of days ago. He does not wear compression garments due to discomfort with stockings, but he has never received juxta lite or Farrow wrap garments. 02/22/2023: His left leg has healed. On the right, the open area has contracted considerably. There is some slough and eschar present. He did meet with vascular surgery for his initial consultation and will return in 3 months to discuss saphenous vein ablation. Electronic Signature(s) Signed: 02/22/2023 10:34:05 AM By: Duanne Guess MD FACS Entered By: Duanne Guess on 02/22/2023 10:34:04 -------------------------------------------------------------------------------- Physical Exam Details Patient Name: Date of Service: [name], Jordan E. 02/22/2023 10:00 A M Medical Record Number: [id number] Patient Account Number:  [id number] Date of Birth/Sex: Treating RN: 09-10-1931 (89 y.o. M) Primary Care Provider: Alcide Clever Other Clinician: Referring Provider: Treating Provider/Extender: Duanne Guess [facility], MICHELLE Weeks in Treatment: [address] ROONEY, SWAILS E ([id number]) [id number] Page 3 of 9 Constitutional . . . . no acute distress. Respiratory Normal work of breathing on room air. Notes 02/22/2023: His left leg has healed. On the right, the open area has contracted considerably. There is some slough and eschar present. Electronic Signature(s) Signed: 02/22/2023 10:35:23 AM By: Duanne Guess MD FACS Entered By: Duanne Guess on 02/22/2023 10:35:23 -------------------------------------------------------------------------------- Physician Orders Details Patient Name: Date of Service: [name], Jye E. 02/22/2023 10:00 A M Medical Record Number: [id number] Patient Account Number: [id number] Date of Birth/Sex: Treating RN: 1932-03-08 (89 y.o. Yates Decamp Primary Care Provider: Alcide Clever Other Clinician: Referring Provider: Treating Provider/Extender: Duanne Guess [facility], MICHELLE Weeks in Treatment: 6 Verbal / Phone Orders: No Diagnosis Coding ICD-10 Coding Code Description [phone] Non-pressure chronic ulcer of other part of right lower leg with fat layer exposed I87.2 Venous insufficiency (chronic) (peripheral) I50.9 Heart failure, unspecified R60.0 Localized edema I25.10 Atherosclerotic heart disease of native coronary artery without angina pectoris L84 Corns and callosities Follow-up Appointments ppointment in 1 week. - Dr. Lady Gary RM 1 Return A BRING FARROW WRAPS TO NEXT APPOINTMENT PLEASE. Bathing/ Shower/ Hygiene May shower with protection but do not get wound dressing(s) wet. Protect dressing(s) with water repellant cover (for example, large plastic bag) or a cast cover and may then take shower. Edema Control - Lymphedema / SCD /  Other Elevate legs to the level of the heart or above for 30 minutes daily and/or when sitting for 3-4 times a day throughout the day. - throughout the day Avoid standing for long periods of time. Exercise regularly Compression stocking or Garment 20-30 mm/Hg pressure to: - right leg daily Wound Treatment Wound #2 - Ankle Wound Laterality: Right, Medial Cleanser: Soap and Water 1 x Per Week/30 Days Discharge Instructions: May shower and wash wound with dial antibacterial soap and water prior to dressing change. Cleanser: Wound Cleanser 1 x Per Week/30 Days Discharge Instructions: Cleanse the wound with  wound cleanser prior to applying a clean dressing using gauze sponges, not tissue or cotton balls. Peri-Wound Care: Sween Lotion (Moisturizing lotion) 1 x Per Week/30 Days Discharge Instructions: Apply moisturizing lotion as directed Prim Dressing: Maxorb Extra Ag+ Alginate Dressing, 2x2 (in/in) 1 x Per Week/30 Days ary Discharge Instructions: Apply to wound bed as instructed Secondary Dressing: Woven Gauze Sponge, Non-Sterile 4x4 in 1 x Per Week/30 Days SAI, ZINN ([id number]) [phone].pdf Page 4 of 9 Discharge Instructions: Apply over primary dressing as directed. Compression Wrap: Urgo K2 Lite, (equivalent to a 3 layer) two layer compression system, regular 1 x Per Week/30 Days Discharge Instructions: Apply Urgo K2 Lite as directed (alternative to 3 layer compression). Compression Stockings: Jobst Farrow Wrap 4000 Right Leg Compression Amount: 30-40 mmHG Discharge Instructions: Apply Renee Pain daily as instructed. Apply first thing in the morning, remove at night before bed. Electronic Signature(s) Signed: 02/22/2023 11:13:08 AM By: Duanne Guess MD FACS Entered By: Duanne Guess on 02/22/2023 10:35:36 -------------------------------------------------------------------------------- Problem List Details Patient Name: Date of Service: [name],  Hanan E. 02/22/2023 10:00 A M Medical Record Number: [id number] Patient Account Number: [id number] Date of Birth/Sex: Treating RN: 1931/12/15 (89 y.o. Yates Decamp Primary Care Provider: Alcide Clever Other Clinician: Referring Provider: Treating Provider/Extender: Duanne Guess [facility], MICHELLE Weeks in Treatment: 6 Active Problems ICD-10 Encounter Code Description Active Date MDM Diagnosis [phone] Non-pressure chronic ulcer of other part of right lower leg with fat layer 02/15/2023 No Yes exposed I87.2 Venous insufficiency (chronic) (peripheral) 01/11/2023 No Yes I50.9 Heart failure, unspecified 01/11/2023 No Yes R60.0 Localized edema 01/11/2023 No Yes I25.10 Atherosclerotic heart disease of native coronary artery without angina pectoris 01/11/2023 No Yes L84 Corns and callosities 02/07/2023 No Yes Inactive Problems Resolved Problems ICD-10 Code Description Active Date Resolved Date L97.322 Non-pressure chronic ulcer of left ankle with fat layer exposed 01/11/2023 01/11/2023 Electronic Signature(s) DRAYLEN, LOBUE ([id number]) [id number] Page 5 of 9 Signed: 02/22/2023 10:33:00 AM By: Duanne Guess MD FACS Entered By: Duanne Guess on 02/22/2023 10:33:00 -------------------------------------------------------------------------------- Progress Note Details Patient Name: Date of Service: [name], Jordan E. 02/22/2023 10:00 A M Medical Record Number: [id number] Patient Account Number: [id number] Date of Birth/Sex: Treating RN: 06/15/1932 (89 y.o. M) Primary Care Provider: Alcide Clever Other Clinician: Referring Provider: Treating Provider/Extender: Duanne Guess [facility], MICHELLE Weeks in Treatment: 6 Subjective Chief Complaint Information obtained from Patient Patient presents for treatment of an open ulcer due to venous insufficiency History of Present Illness (HPI) ADMISSION 01/11/2023 This is an 89 year old nondiabetic with  a history of chronic venous insufficiency, coronary artery disease, congestive heart failure, and lower extremity edema. He has historically been followed by podiatry for various ulcers and calluses on his feet. When he was seen in the middle of April, he was noted to have an ulcer starting on his left ankle. He was prescribed a course of cephalexin and advised to apply Betadine to the wound with an Ace wrap. He was referred to the wound care center for further evaluation and management. Apparently the wound closed of its own accord prior to his appointment with [place], but subsequently reopened. The notes from podiatry do indicate that compression and elevation were discussed as means to help heal ulceration and avoid future episodes. After the wound reopened, he was seen again and prescribed a course of Augmentin. He is now here for his wound care appointment. 01/21/2023: The wound is smaller and more superficial. It is fairly clean with just a little bit of light slough  on the surface. His venous reflux studies are scheduled for this coming Thursday. 01/29/2023: The wound is smaller again today and very superficial. There is minimal slough present with a little eschar around the edges. His venous reflux studies were completed, and as expected, he has severe venous reflux. 02/07/2023: His wound is very nearly closed. He has an appointment coming up with vascular surgery on July 8. He indicated a callus on the bottom of his left foot that is bothering him today. 02/15/2023: There is just a pinhole opening remaining on the left, but unfortunately, he has had a new ulceration occur on the medial aspect of his right lower leg. The fat layer is exposed. He does have 2+ pitting edema in this leg. He says he noted fluid coming from the site a couple of days ago. He does not wear compression garments due to discomfort with stockings, but he has never received juxta lite or Farrow wrap garments. 02/22/2023: His left  leg has healed. On the right, the open area has contracted considerably. There is some slough and eschar present. He did meet with vascular surgery for his initial consultation and will return in 3 months to discuss saphenous vein ablation. Patient History Information obtained from Patient, Chart. Family History Lung Disease - Siblings, Stroke - Siblings, No family history of Cancer, Diabetes, Heart Disease, Hereditary Spherocytosis, Hypertension, Kidney Disease, Seizures, Thyroid Problems, Tuberculosis. Social History Former smoker, Marital Status - Married, Alcohol Use - Moderate, Drug Use - No History, Caffeine Use - Rarely. Medical History Eyes Patient has history of Cataracts - bil extractions Denies history of Glaucoma, Optic Neuritis Ear/Nose/Mouth/Throat Denies history of Chronic sinus problems/congestion, Middle ear problems Cardiovascular Patient has history of Congestive Heart Failure, Coronary Artery Disease, Hypertension, Peripheral Venous Disease Endocrine Denies history of Type I Diabetes, Type II Diabetes Integumentary (Skin) Denies history of History of Burn Musculoskeletal Patient has history of Osteoarthritis Neurologic Patient has history of Neuropathy Oncologic Denies history of Received Chemotherapy, Received Radiation Psychiatric Denies history of Anorexia/bulimia, Confinement Anxiety Hospitalization/Surgery History - bil carpal tunnel release. - cardiac cath. - kyphoplasty. - ORIF patella left. - left total knee arthroplasty. - posterior lumbar fusion. - lumbar laminectomy. - posterior cervical fusion. - appendectomy. - blepharoplasty. - cholecystectomy. - bil catract extraction. - right knee DEMETRICE, COMBES E ([id number]) [id number] Page 6 of 9 replacement. - melanoma excision. - rectal surgery fissure. - vitrectomy. Medical A Surgical History Notes nd Ear/Nose/Mouth/Throat seasonal allergies Musculoskeletal myositis, myalgia,  left patella fracture, bursitis of hips, lumbar compression fx, spondylolistthesis Neurologic bil carpal tunnel Oncologic skin cancers Objective Constitutional no acute distress. Vitals Time Taken: 10:00 AM, Height: 72 in, Weight: 198 lbs, BMI: 26.9, Temperature: 98.3 F, Pulse: 75 bpm, Respiratory Rate: 20 breaths/min, Blood Pressure: 126/63 mmHg. Respiratory Normal work of breathing on room air. General Notes: 02/22/2023: His left leg has healed. On the right, the open area has contracted considerably. There is some slough and eschar present. Integumentary (Hair, Skin) Wound #1 status is Healed - Epithelialized. Original cause of wound was Gradually Appeared. The date acquired was: 05/12/2022. The wound has been in treatment 6 weeks. The wound is located on the Left,Medial Lower Leg. The wound measures 0cm length x 0cm width x 0cm depth; 0cm^2 area and 0cm^3 volume. There is a medium amount of serosanguineous drainage noted. Wound #2 status is Open. Original cause of wound was Gradually Appeared. The date acquired was: 02/12/2023. The wound has been in treatment 1 weeks. The wound is located on  the Right,Medial Ankle. The wound measures 1.6cm length x 1cm width x 0.1cm depth; 1.257cm^2 area and 0.126cm^3 volume. There is a medium amount of serosanguineous drainage noted. Assessment Active Problems ICD-10 Non-pressure chronic ulcer of other part of right lower leg with fat layer exposed Venous insufficiency (chronic) (peripheral) Heart failure, unspecified Localized edema Atherosclerotic heart disease of native coronary artery without angina pectoris Corns and callosities Procedures Wound #2 Pre-procedure diagnosis of Wound #2 is a Venous Leg Ulcer located on the Right,Medial Ankle .Severity of Tissue Pre Debridement is: Fat layer exposed. There was a Selective/Open Wound Non-Viable Tissue Debridement with a total area of 1.26 sq cm performed by Duanne Guess, MD. With the  following instrument(s): Curette to remove Viable and Non-Viable tissue/material. Material removed includes [facility] after achieving pain control using Lidocaine 4% Topical Solution. No specimens were taken. A time out was conducted at 10:30, prior to the start of the procedure. A Minimum amount of bleeding was controlled with Pressure. The procedure was tolerated well with a pain level of 0 throughout and a pain level of 0 following the procedure. Post Debridement Measurements: 1.6cm length x 1cm width x 0.1cm depth; 0.126cm^3 volume. Character of Wound/Ulcer Post Debridement is improved. Severity of Tissue Post Debridement is: Fat layer exposed. Post procedure Diagnosis Wound #2: Same as Pre-Procedure General Notes: scribed for Dr. Lady Gary by Brenton Grills RN. Plan Follow-up Appointments: Return Appointment in 1 week. - Dr. Lady Gary RM 1 BRING FARROW WRAPS TO NEXT APPOINTMENT PLEASE. Bathing/ Shower/ Hygiene: JAWON, DIPIERO ([id number]) [id number] Page 7 of 9 May shower with protection but do not get wound dressing(s) wet. Protect dressing(s) with water repellant cover (for example, large plastic bag) or a cast cover and may then take shower. Edema Control - Lymphedema / SCD / Other: Elevate legs to the level of the heart or above for 30 minutes daily and/or when sitting for 3-4 times a day throughout the day. - throughout the day Avoid standing for long periods of time. Exercise regularly Compression stocking or Garment 20-30 mm/Hg pressure to: - right leg daily WOUND #2: - Ankle Wound Laterality: Right, Medial Cleanser: Soap and Water 1 x Per Week/30 Days Discharge Instructions: May shower and wash wound with dial antibacterial soap and water prior to dressing change. Cleanser: Wound Cleanser 1 x Per Week/30 Days Discharge Instructions: Cleanse the wound with wound cleanser prior to applying a clean dressing using gauze sponges, not tissue or cotton  balls. Peri-Wound Care: Sween Lotion (Moisturizing lotion) 1 x Per Week/30 Days Discharge Instructions: Apply moisturizing lotion as directed Prim Dressing: Maxorb Extra Ag+ Alginate Dressing, 2x2 (in/in) 1 x Per Week/30 Days ary Discharge Instructions: Apply to wound bed as instructed Secondary Dressing: Woven Gauze Sponge, Non-Sterile 4x4 in 1 x Per Week/30 Days Discharge Instructions: Apply over primary dressing as directed. Com pression Wrap: Urgo K2 Lite, (equivalent to a 3 layer) two layer compression system, regular 1 x Per Week/30 Days Discharge Instructions: Apply Urgo K2 Lite as directed (alternative to 3 layer compression). Com pression Stockings: Jobst Farrow Wrap 4000 Compression Amount: 30-40 mmHg (right) Discharge Instructions: Apply Renee Pain daily as instructed. Apply first thing in the morning, remove at night before bed. 02/22/2023: His left leg has healed. On the right, the open area has contracted considerably. There is some slough and eschar present. I used a curette to debride slough and eschar from the right medial lower leg wound. We will continue silver alginate and Urgo lite compression. He did receive his  Farrow wraps and we have asked him to bring those with him to his next clinic visit. Follow-up in 1 week. Electronic Signature(s) Signed: 02/22/2023 10:49:04 AM By: Duanne Guess MD FACS Previous Signature: 02/22/2023 10:35:46 AM Version By: Duanne Guess MD FACS Entered By: Duanne Guess on 02/22/2023 10:49:04 -------------------------------------------------------------------------------- HxROS Details Patient Name: Date of Service: [name], Jordan E. 02/22/2023 10:00 A M Medical Record Number: [id number] Patient Account Number: [id number] Date of Birth/Sex: Treating RN: 02-05-1932 (89 y.o. M) Primary Care Provider: Alcide Clever Other Clinician: Referring Provider: Treating Provider/Extender: Duanne Guess [facility], MICHELLE Weeks in  Treatment: 6 Information Obtained From Patient Chart Eyes Medical History: Positive for: Cataracts - bil extractions Negative for: Glaucoma; Optic Neuritis Ear/Nose/Mouth/Throat Medical History: Negative for: Chronic sinus problems/congestion; Middle ear problems Past Medical History Notes: seasonal allergies Cardiovascular Medical History: Positive for: Congestive Heart Failure; Coronary Artery Disease; Hypertension; Peripheral Venous Disease Endocrine Medical History: Negative for: Type I Diabetes; Type II Diabetes [name], Jordan E ([id number]) [id number] Page 8 of 9 Integumentary (Skin) Medical History: Negative for: History of Burn Musculoskeletal Medical History: Positive for: Osteoarthritis Past Medical History Notes: myositis, myalgia, left patella fracture, bursitis of hips, lumbar compression fx, spondylolistthesis Neurologic Medical History: Positive for: Neuropathy Past Medical History Notes: bil carpal tunnel Oncologic Medical History: Negative for: Received Chemotherapy; Received Radiation Past Medical History Notes: skin cancers Psychiatric Medical History: Negative for: Anorexia/bulimia; Confinement Anxiety HBO Extended History Items Eyes: Cataracts Immunizations Pneumococcal Vaccine: Received Pneumococcal Vaccination: No Implantable Devices No devices added Hospitalization / Surgery History Type of Hospitalization/Surgery bil carpal tunnel release cardiac cath kyphoplasty ORIF patella left left total knee arthroplasty posterior lumbar fusion lumbar laminectomy posterior cervical fusion appendectomy blepharoplasty cholecystectomy bil catract extraction right knee replacement melanoma excision rectal surgery fissure vitrectomy Family and Social History Cancer: No; Diabetes: No; Heart Disease: No; Hereditary Spherocytosis: No; Hypertension: No; Kidney Disease: No; Lung Disease: Yes - Siblings; Seizures: No;  Stroke: Yes - Siblings; Thyroid Problems: No; Tuberculosis: No; Former smoker; Marital Status - Married; Alcohol Use: Moderate; Drug Use: No History; Caffeine Use: Rarely; Financial Concerns: No; Food, Clothing or Shelter Needs: No; Support System Lacking: No; Transportation Concerns: No Electronic Signature(s) Signed: 02/22/2023 11:13:08 AM By: Duanne Guess MD FACS Entered By: Duanne Guess on 02/22/2023 10:34:11 [name] ([id number]) [id number] Page 9 of 9 -------------------------------------------------------------------------------- SuperBill Details Patient Name: Date of Service: [place] 02/22/2023 Medical Record Number: [id number] Patient Account Number: [id number] Date of Birth/Sex: Treating RN: 1932-05-12 (88 y.o. M) Primary Care Provider: Alcide Clever Other Clinician: Referring Provider: Treating Provider/Extender: Duanne Guess [facility], MICHELLE Weeks in Treatment: 6 Diagnosis Coding ICD-10 Codes Code Description [phone] Non-pressure chronic ulcer of other part of right lower leg with fat layer exposed I87.2 Venous insufficiency (chronic) (peripheral) I50.9 Heart failure, unspecified R60.0 Localized edema I25.10 Atherosclerotic heart disease of native coronary artery without angina pectoris L84 Corns and callosities Facility Procedures : CPT4 Code: [id number] Description: 97597 - DEBRIDE WOUND 1ST 20 SQ CM OR < ICD-10 Diagnosis Description 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 Modifier: ed Quantity: 1 Physician Procedures : CPT4 Code Description Modifier [id number] - WC PHYS LEVEL 3 - EST PT 25 ICD-10 Diagnosis Description 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ed I87.2 Venous insufficiency (chronic) (peripheral) R60.0  Localized edema I50.9 Heart failure, unspecified Quantity: 1 : [id number] 97597 - WC PHYS DEBR  WO ANESTH 20 SQ CM  ICD-10 Diagnosis Description 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ed Quantity: 1 Electronic Signature(s) Signed: 02/22/2023 11:13:08 AM By: Duanne Guess MD FACS Signed: 02/27/2023 7:59:22 AM By: Brenton Grills Previous Signature: 02/22/2023 10:49:29 AM Version By: Duanne Guess MD FACS Entered By: Brenton Grills on 02/22/2023 10:50:52

## 2023-02-27 NOTE — Progress Notes (Signed)
AABAN, GRIEP ([id number]) [id number] Page 1 of 8 Visit Report for 02/22/2023 Arrival Information Details Patient Name: Date of Service: [place] 02/22/2023 10:00 A M Medical Record Number: [id number] Patient Account Number: [id number] Date of Birth/Sex: Treating RN: 1932/03/04 (89 y.o. M) Primary Care Zlatan Hornback: Alcide Clever Other Clinician: Referring Ranisha Allaire: Treating Barbar Brede/Extender: Duanne Guess [facility], MICHELLE Weeks in Treatment: 6 Visit Information History Since Last Visit All ordered tests and consults were completed: No Patient Arrived: Dan Humphreys Added or deleted any medications: No Arrival Time: 10:00 Any new allergies or adverse reactions: No Accompanied By: self Had a fall or experienced change in No Transfer Assistance: None activities of daily living that may affect Patient Identification Verified: Yes risk of falls: Secondary Verification Process Completed: Yes Signs or symptoms of abuse/neglect since last visito No Patient Requires Transmission-Based Precautions: No Hospitalized since last visit: No Patient Has Alerts: No Implantable device outside of the clinic excluding No cellular tissue based products placed in the center since last visit: Pain Present Now: No Electronic Signature(s) Signed: 02/22/2023 11:32:59 AM By: Dayton Scrape Entered By: Dayton Scrape on 02/22/2023 10:00:28 -------------------------------------------------------------------------------- Encounter Discharge Information Details Patient Name: Date of Service: [name], Jasper E. 02/22/2023 10:00 A M Medical Record Number: [id number] Patient Account Number: [id number] Date of Birth/Sex: Treating RN: [birth year] (89 y.o. Yates Decamp Primary Care Wendell Nicoson: Alcide Clever Other Clinician: Referring Munachimso Palin: Treating Hamza Empson/Extender: Duanne Guess [facility], MICHELLE Weeks in Treatment: 6 Encounter Discharge Information Items Post  Procedure Vitals Discharge Condition: Stable Temperature (F): 98.3 Ambulatory Status: Walker Pulse (bpm): 80 Discharge Destination: Home Respiratory Rate (breaths/min): 18 Transportation: Private Auto Blood Pressure (mmHg): 125/64 Accompanied By: self Schedule Follow-up Appointment: Yes Clinical Summary of Care: Patient Declined Electronic Signature(s) Signed: 02/27/2023 7:59:22 AM By: Brenton Grills Entered By: Brenton Grills on 02/22/2023 10:51:52 [name] ([id number]) [id number] Page 2 of 8 -------------------------------------------------------------------------------- Lower Extremity Assessment Details Patient Name: Date of Service: [place] 02/22/2023 10:00 A M Medical Record Number: [id number] Patient Account Number: [id number] Date of Birth/Sex: Treating RN: Nov 12, 1931 (89 y.o. Yates Decamp Primary Care Corinthia Helmers: Alcide Clever Other Clinician: Referring Meredith Kilbride: Treating Seaton Hofmann/Extender: Duanne Guess [facility], MICHELLE Weeks in Treatment: 6 Edema Assessment Assessed: [Left: No] [Right: No] Edema: [Left: Yes] [Right: Yes] Calf Left: Right: Point of Measurement: From Medial Instep 35 cm 35.2 cm Ankle Left: Right: Point of Measurement: From Medial Instep 22.4 cm 23.4 cm Vascular Assessment Pulses: Dorsalis Pedis Palpable: [Left:Yes] [Right:Yes] Extremity colors, hair growth, and conditions: Extremity Color: [Left:Normal] [Right:Normal] Hair Growth on Extremity: [Left:No] [Right:No] Temperature of Extremity: [Left:Warm] [Right:Warm] Capillary Refill: [Left:< 3 seconds] [Right:< 3 seconds] Dependent Rubor: [Left:No] [Right:No] Blanched when Elevated: [Left:No No] [Right:No No] Toe Nail Assessment Left: Right: Thick: No No Discolored: No No Deformed: No No Improper Length and Hygiene: No No Electronic Signature(s) Signed: 02/27/2023 7:59:22 AM By: Brenton Grills Entered By: Brenton Grills on 02/22/2023  10:16:32 -------------------------------------------------------------------------------- Multi Wound Chart Details Patient Name: Date of Service: [name], Azlan E. 02/22/2023 10:00 A M Medical Record Number: [id number] Patient Account Number: [id number] Date of Birth/Sex: Treating RN: Apr 25, 1932 (89 y.o. M) Primary Care Ozella Comins: Alcide Clever Other Clinician: Referring Micki Cassel: Treating Janathan Bribiesca/Extender: Duanne Guess [facility], MICHELLE Weeks in Treatment: 6 Vital Signs Height(in): 72 Pulse(bpm): 75 Weight(lbs): 198 Blood Pressure(mmHg): 126/63 STEN, DEMATTEO ([id number]) [id number] Page 3 of 8 Body Mass Index(BMI): 26.9 Temperature(F): 98.3 Respiratory Rate(breaths/min): 20 [1:Photos: No Photos Left, Medial Lower Leg Wound Location: Gradually Appeared Wounding Event:  Venous Leg Ulcer Primary Etiology: Lymphedema Secondary Etiology: 05/12/2022 Date Acquired: 6 Weeks of Treatment: Healed - Epithelialized Wound Status: No Wound  Recurrence: 0x0x0 Measurements L x W x D (cm) 0 A (cm) : rea 0 Volume (cm) : 100.00% % Reduction in A rea: 100.00% % Reduction in Volume: Full Thickness Without Exposed Classification: Support Structures Medium Exudate Amount: Serosanguineous Exudate  Type: red, brown Exudate Color: N/A Debridement: Pre-procedure Verification/Time Out N/A Taken: N/A Pain Control: N/A Tissue Debrided: N/A Level: N/A Debridement A (sq cm): rea N/A Instrument: N/A Bleeding: N/A Hemostasis A chieved: N/A Procedural Pain:  N/A Post Procedural Pain: N/A Debridement Treatment Response: N/A Post Debridement Measurements L x W x D (cm) N/A Post Debridement Volume: (cm) N/A Procedures Performed:] [2:No Photos Right, Medial Ankle Gradually Appeared Venous Leg Ulcer N/A 02/12/2023  1 Open No 1.6x1x0.1 1.257 0.126 84.10% 84.10% Full Thickness Without Exposed Support Structures Medium Serosanguineous red, brown Debridement - Selective/Open Wound 10:30 Lidocaine  4% Topical Solution Slough Non-Viable Tissue 1.26 Curette Minimum  Pressure 0 0 Procedure was tolerated well 1.6x1x0.1 0.126 Debridement] [N/A:N/A N/A N/A N/A N/A N/A N/A N/A N/A N/A N/A N/A N/A N/A N/A N/A N/A N/A N/A N/A N/A N/A N/A N/A N/A N/A N/A N/A N/A N/A N/A N/A N/A] Treatment Notes Electronic Signature(s) Signed: 02/22/2023 10:33:05 AM By: Duanne Guess MD FACS Entered By: Duanne Guess on 02/22/2023 10:33:05 -------------------------------------------------------------------------------- Multi-Disciplinary Care Plan Details Patient Name: Date of Service: [name], Norrin E. 02/22/2023 10:00 A M Medical Record Number: [id number] Patient Account Number: [id number] Date of Birth/Sex: Treating RN: [birth year] (89 y.o. Yates Decamp Primary Care Urho Rio: Alcide Clever Other Clinician: Referring Jasma Seevers: Treating Marsh Heckler/Extender: Duanne Guess [facility], MICHELLE Weeks in Treatment: 6 Multidisciplinary Care Plan reviewed with physician Active Inactive Venous Leg Ulcer Nursing Diagnoses: Knowledge deficit related to disease process and management Potential for venous Insuffiency (use before diagnosis confirmed) XANE, AMSDEN E ([id number]) [id number] Page 4 of 8 Goals: Patient will maintain optimal edema control Date Initiated: 01/11/2023 Target Resolution Date: 02/08/2023 Goal Status: Active Interventions: Assess peripheral edema status every visit. Compression as ordered Provide education on venous insufficiency Treatment Activities: Therapeutic compression applied : 01/11/2023 Notes: Wound/Skin Impairment Nursing Diagnoses: Impaired tissue integrity Knowledge deficit related to ulceration/compromised skin integrity Goals: Patient/caregiver will verbalize understanding of skin care regimen Date Initiated: 01/11/2023 Target Resolution Date: 02/08/2023 Goal Status: Active Ulcer/skin breakdown will have a volume reduction of 30% by  week 4 Date Initiated: 01/11/2023 Date Inactivated: 02/07/2023 Target Resolution Date: 02/08/2023 Goal Status: Met Interventions: Assess patient/caregiver ability to obtain necessary supplies Assess patient/caregiver ability to perform ulcer/skin care regimen upon admission and as needed Assess ulceration(s) every visit Provide education on ulcer and skin care Treatment Activities: Skin care regimen initiated : 01/11/2023 Topical wound management initiated : 01/11/2023 Notes: Electronic Signature(s) Signed: 02/27/2023 7:59:22 AM By: Brenton Grills Entered By: Brenton Grills on 02/22/2023 10:22:40 -------------------------------------------------------------------------------- Pain Assessment Details Patient Name: Date of Service: [name], Ferlin E. 02/22/2023 10:00 A M Medical Record Number: [id number] Patient Account Number: [id number] Date of Birth/Sex: Treating RN: 09/08/1931 (89 y.o. M) Primary Care Lonny Eisen: Alcide Clever Other Clinician: Referring Diamon Reddinger: Treating Anayelli Lai/Extender: Duanne Guess [facility], MICHELLE Weeks in Treatment: 6 Active Problems Location of Pain Severity and Description of Pain Patient Has Paino No Site Locations MARIANA, GOYTIA E ([id number]) [id number] Page 5 of 8 Pain Management and Medication Current Pain Management: Electronic Signature(s) Signed: 02/22/2023 11:32:59 AM By: Dayton Scrape Entered By: Dayton Scrape on 02/22/2023 10:00:55 --------------------------------------------------------------------------------  Patient/Caregiver Education Details Patient Name: Date of Service: [place] 7/12/2024andnbsp10:00 A M Medical Record Number: [id number] Patient Account Number: [id number] Date of Birth/Gender: Treating RN: 10/29/1931 (89 y.o. Yates Decamp Primary Care Physician: Alcide Clever Other Clinician: Referring Physician: Treating Physician/Extender: Duanne Guess [facility], MICHELLE Weeks  in Treatment: 6 Education Assessment Education Provided To: Patient Education Topics Provided Wound/Skin Impairment: Methods: Explain/Verbal Responses: State content correctly Electronic Signature(s) Signed: 02/27/2023 7:59:22 AM By: Brenton Grills Entered By: Brenton Grills on 02/22/2023 10:23:27 -------------------------------------------------------------------------------- Wound Assessment Details Patient Name: Date of Service: [name], Murray E. 02/22/2023 10:00 A M Medical Record Number: [id number] Patient Account Number: [id number] Date of Birth/Sex: Treating RN: Jul 22, 1932 (89 y.o. Yates Decamp Primary Care Eriyah Fernando: Alcide Clever Other Clinician: Referring Jayleen Scaglione: Treating Beckem Tomberlin/Extender: Duanne Guess [facility] ([id number]) [id number] Page 6 of 8 Weeks in Treatment: 6 Wound Status Wound Number: 1 Primary Etiology: Venous Leg Ulcer Wound Location: Left, Medial Lower Leg Secondary Etiology: Lymphedema Wounding Event: Gradually Appeared Wound Status: Healed - Epithelialized Date Acquired: 05/12/2022 Weeks Of Treatment: 6 Clustered Wound: No Wound Measurements Length: (cm) Width: (cm) Depth: (cm) Area: (cm) Volume: (cm) 0 % Reduction in Area: 100% 0 % Reduction in Volume: 100% 0 0 0 Wound Description Classification: Full Thickness Without Exposed Support Exudate Amount: Medium Exudate Type: Serosanguineous Exudate Color: red, brown Structures Periwound Skin Texture Texture Color No Abnormalities Noted: No No Abnormalities Noted: No Moisture No Abnormalities Noted: No Treatment Notes Wound #1 (Lower Leg) Wound Laterality: Left, Medial Cleanser Peri-Wound Care Topical Primary Dressing Secondary Dressing Secured With Compression Wrap Compression Stockings Add-Ons Electronic Signature(s) Signed: 02/27/2023 7:59:22 AM By: Brenton Grills Entered By: Brenton Grills on 02/22/2023  10:17:45 -------------------------------------------------------------------------------- Wound Assessment Details Patient Name: Date of Service: [name], Elster E. 02/22/2023 10:00 A M Medical Record Number: [id number] Patient Account Number: [id number] Date of Birth/Sex: Treating RN: 12-31-1931 (89 y.o. M) Primary Care Aemon Koeller: Alcide Clever Other Clinician: Referring Irene Mitcham: Treating Percilla Tweten/Extender: Duanne Guess [facility], MICHELLE Weeks in Treatment: 6 Wound Status Wound Number: 2 Primary Etiology: Venous Leg Ulcer Wound Location: Right, Medial Ankle Wound Status: Open Wounding Event: Gradually RAYNOR, CALCATERRA ([id number]) [id number] Page 7 of 8 Date Acquired: 02/12/2023 Weeks Of Treatment: 1 Clustered Wound: No Wound Measurements Length: (cm) 1.6 Width: (cm) 1 Depth: (cm) 0.1 Area: (cm) 1.257 Volume: (cm) 0.126 % Reduction in Area: 84.1% % Reduction in Volume: 84.1% Wound Description Classification: Full Thickness Without Exposed Suppor Exudate Amount: Medium Exudate Type: Serosanguineous Exudate Color: red, brown t Structures Periwound Skin Texture Texture Color No Abnormalities Noted: No No Abnormalities Noted: No Moisture No Abnormalities Noted: No Treatment Notes Wound #2 (Ankle) Wound Laterality: Right, Medial Cleanser Soap and Water Discharge Instruction: May shower and wash wound with dial antibacterial soap and water prior to dressing change. Wound Cleanser Discharge Instruction: Cleanse the wound with wound cleanser prior to applying a clean dressing using gauze sponges, not tissue or cotton balls. Peri-Wound Care Sween Lotion (Moisturizing lotion) Discharge Instruction: Apply moisturizing lotion as directed Topical Primary Dressing Maxorb Extra Ag+ Alginate Dressing, 2x2 (in/in) Discharge Instruction: Apply to wound bed as instructed Secondary Dressing Woven Gauze Sponge, Non-Sterile 4x4  in Discharge Instruction: Apply over primary dressing as directed. Secured With Compression Wrap Urgo K2 Lite, (equivalent to a 3 layer) two layer compression system, regular Discharge Instruction: Apply Urgo K2 Lite as directed (alternative to 3 layer compression). Compression Stockings Jobst Farrow Wrap 4000 Quantity: 1 Right Leg Compression  Amount: 30-40 mmHg Discharge Instruction: Apply Renee Pain daily as instructed. Apply first thing in the morning, remove at night before bed. Add-Ons Electronic Signature(s) Signed: 02/22/2023 11:32:59 AM By: Dayton Scrape Entered By: Dayton Scrape on 02/22/2023 10:11:47 [name] ([id number]) [id number] Page 8 of 8 -------------------------------------------------------------------------------- Vitals Details Patient Name: Date of Service: [place] RD, DALTIN CRIST. 02/22/2023 10:00 A M Medical Record Number: [id number] Patient Account Number: [id number] Date of Birth/Sex: Treating RN: 03-18-1932 (89 y.o. M) Primary Care Rahiem Schellinger: Alcide Clever Other Clinician: Referring Cyndie Woodbeck: Treating Garren Greenman/Extender: Duanne Guess [facility], MICHELLE Weeks in Treatment: 6 Vital Signs Time Taken: 10:00 Temperature (F): 98.3 Height (in): 72 Pulse (bpm): 75 Weight (lbs): 198 Respiratory Rate (breaths/min): 20 Body Mass Index (BMI): 26.9 Blood Pressure (mmHg): 126/63 Reference Range: 80 - 120 mg / dl Electronic Signature(s) Signed: 02/22/2023 11:32:59 AM By: Dayton Scrape Entered By: Dayton Scrape on 02/22/2023 10:00:49

## 2023-03-01 ENCOUNTER — Other Ambulatory Visit: Payer: Self-pay | Admitting: Internal Medicine

## 2023-03-04 ENCOUNTER — Encounter (HOSPITAL_BASED_OUTPATIENT_CLINIC_OR_DEPARTMENT_OTHER): Payer: Medicare Other | Admitting: General Surgery

## 2023-03-04 DIAGNOSIS — L97812 Non-pressure chronic ulcer of other part of right lower leg with fat layer exposed: Secondary | ICD-10-CM | POA: Diagnosis not present

## 2023-03-04 NOTE — Progress Notes (Signed)
[name], [name] ([id number]) [id number] Page 1 of 9 Visit Report for 03/04/2023 Arrival Information Details Patient Name: Date of Service: [name] 03/04/2023 1:45 PM Medical Record Number: [id number] Patient Account Number: [id number] Date of Birth/Sex: Treating RN: [birth year] (89 y.o. M) Primary Care [name] Boschert: Alcide Clever Other Clinician: Referring Brigett Estell: Treating Mikaella Escalona/Extender: Duanne Guess [facility], MICHELLE Weeks in Treatment: 7 Visit Information History Since Last Visit All ordered tests and consults were completed: No Patient Arrived: [name] Added or deleted any medications: No Arrival Time: 13:48 Any new allergies or adverse reactions: No Accompanied By: self Had a fall or experienced change in No Transfer Assistance: None activities of daily living that may affect Patient Identification Verified: Yes risk of falls: Secondary Verification Process Completed: Yes Signs or symptoms of abuse/neglect since last visito No Patient Requires Transmission-Based Precautions: No Hospitalized since last visit: No Patient Has Alerts: No Implantable device outside of the clinic excluding No cellular tissue based products placed in the center since last visit: Pain Present Now: No Electronic Signature(s) Signed: 03/04/2023 2:18:23 PM By: Dayton Scrape Entered By: Dayton Scrape on 03/04/2023 13:48:31 -------------------------------------------------------------------------------- Compression Therapy Details Patient Name: Date of Service: [name], [name] 03/04/2023 1:45 PM Medical Record Number: [id number] Patient Account Number: [id number] Date of Birth/Sex: Treating RN: January 29, 1932 (89 y.o. Cline Cools Primary Care Dillan Lunden: Alcide Clever Other Clinician: Referring Thom Ollinger: Treating Jacyln Carmer/Extender: Duanne Guess [facility], MICHELLE Weeks in Treatment: 7 Compression Therapy Performed for Wound Assessment: Wound #2  Right,Medial Ankle Performed By: Clinician Redmond Pulling, RN Compression Type: Three Layer Post Procedure Diagnosis Same as Pre-procedure Electronic Signature(s) Signed: 03/04/2023 4:30:58 PM By: Redmond Pulling RN, BSN Entered By: Redmond Pulling on 03/04/2023 14:46:54 [name], [name] ([id number]) [id number] Page 2 of 9 -------------------------------------------------------------------------------- Compression Therapy Details Patient Name: Date of Service: [place] 03/04/2023 1:45 PM Medical Record Number: [id number] Patient Account Number: [id number] Date of Birth/Sex: Treating RN: 1932-07-31 (89 y.o. Cline Cools Primary Care Taylah Dubiel: Alcide Clever Other Clinician: Referring Allona Gondek: Treating Kenzlie Disch/Extender: Duanne Guess [facility], MICHELLE Weeks in Treatment: 7 Compression Therapy Performed for Wound Assessment: Wound #3 Left,Anterior Lower Leg Performed By: Clinician Redmond Pulling, RN Compression Type: Three Layer Post Procedure Diagnosis Same as Pre-procedure Electronic Signature(s) Signed: 03/04/2023 4:30:58 PM By: Redmond Pulling RN, BSN Entered By: Redmond Pulling on 03/04/2023 14:46:55 -------------------------------------------------------------------------------- Encounter Discharge Information Details Patient Name: Date of Service: [name], [name] 03/04/2023 1:45 PM Medical Record Number: [id number] Patient Account Number: [id number] Date of Birth/Sex: Treating RN: 03/04/1932 (89 y.o. Cline Cools Primary Care Dorrian Doggett: Alcide Clever Other Clinician: Referring Makana Rostad: Treating Justen Fonda/Extender: Duanne Guess [facility], MICHELLE Weeks in Treatment: 7 Encounter Discharge Information Items Post Procedure Vitals Discharge Condition: Stable Temperature (F): 98.8 Ambulatory Status: Walker Pulse (bpm): 91 Discharge Destination: Home Respiratory Rate (breaths/min): 18 Transportation: Private  Auto Blood Pressure (mmHg): 156/98 Accompanied By: self Schedule Follow-up Appointment: Yes Clinical Summary of Care: Patient Declined Electronic Signature(s) Signed: 03/04/2023 4:30:58 PM By: Redmond Pulling RN, BSN Entered By: Redmond Pulling on 03/04/2023 14:48:01 -------------------------------------------------------------------------------- Lower Extremity Assessment Details Patient Name: Date of Service: [name], [name] 03/04/2023 1:45 PM Medical Record Number: [id number] Patient Account Number: [id number] Date of Birth/Sex: Treating RN: June 19, 1932 (89 y.o. Cline Cools Primary Care Ewan Grau: Alcide Clever Other Clinician: Referring Dawnita Molner: Treating Marshun Duva/Extender: Duanne Guess [facility], MICHELLE Weeks in Treatment: 7 Edema Assessment Assessed: [Left: No] [Right: No] Edema: [Left: Yes] [Right: Yes] Calf [name], [name] ([id number]) [id number] Page 3 of  9 Left: Right: Point of Measurement: From Medial Instep 43 cm 41 cm Ankle Left: Right: Point of Measurement: From Medial Instep 22 cm 24.5 cm Vascular Assessment Extremity colors, hair growth, and conditions: Extremity Color: [Left:Normal] [Right:Normal] Hair Growth on Extremity: [Left:No] [Right:No] Temperature of Extremity: [Left:Warm] [Right:Warm] Capillary Refill: [Left:< 3 seconds] [Right:< 3 seconds] Dependent Rubor: [Left:No No] [Right:No No] Electronic Signature(s) Signed: 03/04/2023 4:30:58 PM By: Redmond Pulling RN, BSN Entered By: Redmond Pulling on 03/04/2023 14:08:32 -------------------------------------------------------------------------------- Multi Wound Chart Details Patient Name: Date of Service: [name], [name] 03/04/2023 1:45 PM Medical Record Number: [id number] Patient Account Number: [id number] Date of Birth/Sex: Treating RN: 10-Nov-1931 (89 y.o. M) Primary Care Yarnell Arvidson: Alcide Clever Other Clinician: Referring Shanyla Marconi: Treating  Kirsti Mcalpine/Extender: Duanne Guess [facility], MICHELLE Weeks in Treatment: 7 Vital Signs Height(in): 72 Pulse(bpm): 91 Weight(lbs): 198 Blood Pressure(mmHg): 156/98 Body Mass Index(BMI): 26.9 Temperature(F): 98.8 Respiratory Rate(breaths/min): 18 [2:Photos:] [N/A:N/A] Right, Medial Ankle Left, Anterior Lower Leg N/A Wound Location: Gradually Appeared Gradually Appeared N/A Wounding Event: Venous Leg Ulcer Venous Leg Ulcer N/A Primary Etiology: Cataracts, Congestive Heart Failure, Cataracts, Congestive Heart Failure, N/A Comorbid History: Coronary Artery Disease, Coronary Artery Disease, Hypertension, Peripheral Venous Hypertension, Peripheral Venous Disease, Osteoarthritis, Neuropathy Disease, Osteoarthritis, Neuropathy 02/12/2023 03/04/2023 N/A Date Acquired: 2 0 N/A Weeks of Treatment: Open Open N/A Wound Status: No No N/A Wound Recurrence: No Yes N/A Clustered Wound: N/A 3 N/A Clustered Quantity: 1.6x2x0.1 3x5x0.1 N/A Measurements L x W x D (cm) 2.513 11.781 N/A A (cm) : rea 0.251 1.178 N/A Volume (cm) : 68.30% N/A N/A % Reduction in Area: 68.30% N/A N/A % Reduction in Volume: Full Thickness Without Exposed Full Thickness Without Exposed N/A Classification: SALMAN, WELLEN ([id number]) [id number] Page 4 of 9 Support Structures Support Structures Medium Medium N/A Exudate A mount: Serosanguineous Serous N/A Exudate Type: red, brown amber N/A Exudate Color: N/A Large (67-100%) N/A Granulation A mount: N/A Red N/A Granulation Quality: N/A Small (1-33%) N/A Necrotic A mount: N/A None N/A Epithelialization: Treatment Notes Electronic Signature(s) Signed: 03/04/2023 2:21:00 PM By: Duanne Guess MD FACS Entered By: Duanne Guess on 03/04/2023 14:21:00 -------------------------------------------------------------------------------- Multi-Disciplinary Care Plan Details Patient Name: Date of Service: [name], [name]  03/04/2023 1:45 PM Medical Record Number: [id number] Patient Account Number: [id number] Date of Birth/Sex: Treating RN: 08-27-1931 (89 y.o. Cline Cools Primary Care Jolin Benavides: Alcide Clever Other Clinician: Referring Kalayah Leske: Treating Aruna Nestler/Extender: Duanne Guess [facility], MICHELLE Weeks in Treatment: 7 Multidisciplinary Care Plan reviewed with physician Active Inactive Venous Leg Ulcer Nursing Diagnoses: Knowledge deficit related to disease process and management Potential for venous Insuffiency (use before diagnosis confirmed) Goals: Patient will maintain optimal edema control Date Initiated: 01/11/2023 Target Resolution Date: 03/28/2023 Goal Status: Active Interventions: Assess peripheral edema status every visit. Compression as ordered Provide education on venous insufficiency Treatment Activities: Therapeutic compression applied : 01/11/2023 Notes: Wound/Skin Impairment Nursing Diagnoses: Impaired tissue integrity Knowledge deficit related to ulceration/compromised skin integrity Goals: Patient/caregiver will verbalize understanding of skin care regimen Date Initiated: 01/11/2023 Target Resolution Date: 03/28/2023 Goal Status: Active Ulcer/skin breakdown will have a volume reduction of 30% by week 4 Date Initiated: 01/11/2023 Date Inactivated: 02/07/2023 Target Resolution Date: 02/08/2023 Goal Status: Met Interventions: Assess patient/caregiver ability to obtain necessary supplies Assess patient/caregiver ability to perform ulcer/skin care regimen upon admission and as needed XAVIER, MUNGER ([id number]) [id number] Page 5 of 9 Assess ulceration(s) every visit Provide education on ulcer and skin care Treatment Activities: Skin care regimen initiated : 01/11/2023 Topical wound management  initiated : 01/11/2023 Notes: Electronic Signature(s) Signed: 03/04/2023 4:30:58 PM By: Redmond Pulling RN, BSN Entered By: Redmond Pulling on  03/04/2023 14:10:44 -------------------------------------------------------------------------------- Pain Assessment Details Patient Name: Date of Service: [name], [name] 03/04/2023 1:45 PM Medical Record Number: [id number] Patient Account Number: [id number] Date of Birth/Sex: Treating RN: 1932/07/09 (89 y.o. M) Primary Care Zayanna Pundt: Alcide Clever Other Clinician: Referring Trellis Vanoverbeke: Treating Rollan Roger/Extender: Duanne Guess [facility], MICHELLE Weeks in Treatment: 7 Active Problems Location of Pain Severity and Description of Pain Patient Has Paino No Site Locations Pain Management and Medication Current Pain Management: Electronic Signature(s) Signed: 03/04/2023 2:18:23 PM By: Dayton Scrape Entered By: Dayton Scrape on 03/04/2023 13:48:59 -------------------------------------------------------------------------------- Patient/Caregiver Education Details Patient Name: Date of Service: [name], [name] 7/22/2024andnbsp1:45 PM Medical Record Number: [id number] Patient Account Number: [id number] Date of Birth/Gender: Treating RN: [birth year] (89 y.o. Claudia Desanctis, [address] [place] [name] ([id number]) [id number] Page 6 of 9 Primary Care Physician: Alcide Clever Other Clinician: Referring Physician: Treating Physician/Extender: Duanne Guess [facility], MICHELLE Weeks in Treatment: 7 Education Assessment Education Provided To: Patient Education Topics Provided Wound/Skin Impairment: Methods: Explain/Verbal Responses: State content correctly Electronic Signature(s) Signed: 03/04/2023 4:30:58 PM By: Redmond Pulling RN, BSN Entered By: Redmond Pulling on 03/04/2023 14:10:59 -------------------------------------------------------------------------------- Wound Assessment Details Patient Name: Date of Service: [name], [name] 03/04/2023 1:45 PM Medical Record Number: [id number] Patient Account Number: [id number] Date of Birth/Sex: Treating  RN: [birth year] (89 y.o. M) Primary Care Montserrath Madding: Alcide Clever Other Clinician: Referring Janaye Corp: Treating Jovonta Levit/Extender: Duanne Guess [facility], MICHELLE Weeks in Treatment: 7 Wound Status Wound Number: 2 Primary Venous Leg Ulcer Etiology: Wound Location: Right, Medial Ankle Wound Open Wounding Event: Gradually Appeared Status: Date Acquired: 02/12/2023 Comorbid Cataracts, Congestive Heart Failure, Coronary Artery Disease, Weeks Of Treatment: 2 History: Hypertension, Peripheral Venous Disease, Osteoarthritis, Clustered Wound: No Neuropathy Photos Wound Measurements Length: (cm) 1.6 Width: (cm) 2 Depth: (cm) 0.1 Area: (cm) 2.513 Volume: (cm) 0.251 % Reduction in Area: 68.3% % Reduction in Volume: 68.3% Wound Description Classification: Full Thickness Without Exposed Support Structures Exudate Amount: Medium Exudate Type: Serosanguineous Exudate Color: red, brown Hels[place], [name] ([id number]) Periwound Skin Texture Texture No Abnormalities Noted: No Moisture No Abnormalities Noted: No [id number] Page 7 of 9 Color No Abnormalities Noted: No Treatment Notes Wound #2 (Ankle) Wound Laterality: Right, Medial Cleanser Soap and Water Discharge Instruction: May shower and wash wound with dial antibacterial soap and water prior to dressing change. Wound Cleanser Discharge Instruction: Cleanse the wound with wound cleanser prior to applying a clean dressing using gauze sponges, not tissue or cotton balls. Peri-Wound Care Zinc Oxide Ointment 30g tube Discharge Instruction: Apply Zinc Oxide to periwound with each dressing change Sween Lotion (Moisturizing lotion) Discharge Instruction: Apply moisturizing lotion as directed Topical Primary Dressing Maxorb Extra Ag+ Alginate Dressing, 2x2 (in/in) Discharge Instruction: Apply to wound bed as instructed Secondary Dressing Woven Gauze Sponge, Non-Sterile 4x4 in Discharge Instruction:  Apply over primary dressing as directed. Secured With Compression Wrap Urgo K2 Lite, (equivalent to a 3 layer) two layer compression system, regular Discharge Instruction: Apply Urgo K2 Lite as directed (alternative to 3 layer compression). Compression Stockings Jobst Farrow Wrap 4000 Quantity: 1 Right Leg Compression Amount: 30-40 mmHg Discharge Instruction: Apply Renee Pain daily as instructed. Apply first thing in the morning, remove at night before bed. Add-Ons Electronic Signature(s) Signed: 03/04/2023 2:18:23 PM By: Dayton Scrape Entered By: Dayton Scrape on 03/04/2023 14:00:12 -------------------------------------------------------------------------------- Wound Assessment Details Patient Name: Date of Service: [name], [name]  03/04/2023 1:45 PM Medical Record Number: [id number] Patient Account Number: [id number] Date of Birth/Sex: Treating RN: August 06, 1932 (89 y.o. Cline Cools Primary Care Elim Peale: Alcide Clever Other Clinician: Referring Maude Hettich: Treating Evangela Heffler/Extender: Duanne Guess [facility], MICHELLE Weeks in Treatment: 7 Wound Status Wound Number: 3 Primary Venous Leg Ulcer Etiology: Wound Location: Left, Anterior Lower Leg Wound Open Wounding Event: Gradually Appeared Status: Date Acquired: 03/04/2023 Comorbid Cataracts, Congestive Heart Failure, Coronary Artery Disease, Weeks Of Treatment: 0 History: Hypertension, Peripheral Venous Disease, Osteoarthritis, Clustered Wound: Yes [name], [name] [name] ([id number]) [id number] Page 8 of 9 Clustered Wound: Yes Neuropathy Photos Wound Measurements Length: (cm) Width: (cm) Depth: (cm) Clustered Quantity: Area: (cm) Volume: (cm) 3 % Reduction in Area: 5 % Reduction in Volume: 0.1 Epithelialization: None 3 Tunneling: No 11.781 Undermining: No 1.178 Wound Description Classification: Full Thickness Without Exposed Sup Exudate Amount: Medium Exudate Type: Serous Exudate  Color: amber port Structures Foul Odor After Cleansing: No Slough/Fibrino No Wound Bed Granulation Amount: Large (67-100%) Exposed Structure Granulation Quality: Red Fascia Exposed: No Necrotic Amount: Small (1-33%) Fat Layer (Subcutaneous Tissue) Exposed: Yes Necrotic Quality: Adherent Slough Tendon Exposed: No Muscle Exposed: No Joint Exposed: No Bone Exposed: No Periwound Skin Texture Texture Color No Abnormalities Noted: No No Abnormalities Noted: No Moisture No Abnormalities Noted: No Treatment Notes Wound #3 (Lower Leg) Wound Laterality: Left, Anterior Cleanser Soap and Water Discharge Instruction: May shower and wash wound with dial antibacterial soap and water prior to dressing change. Peri-Wound Care Topical Primary Dressing Maxorb Extra Ag+ Alginate Dressing, 2x2 (in/in) Discharge Instruction: Apply to wound bed as instructed Secondary Dressing ABD Pad, 8x10 Discharge Instruction: Apply over primary dressing as directed. Secured With Compression Wrap Kerlix Roll 4.5x3.1 (in/yd) Discharge Instruction: Apply Kerlix and Coban compression as directed. Coban Self-Adherent Wrap 4x5 (in/yd) Discharge Instruction: Apply over Kerlix as directed. [employer] w/zinc, 4x10 (in/yd) [name], [name] ([id number]) [id number] Page 9 of 9 Compression Stockings Add-Ons Electronic Signature(s) Signed: 03/04/2023 4:30:58 PM By: Redmond Pulling RN, BSN Entered By: Redmond Pulling on 03/04/2023 14:06:33 -------------------------------------------------------------------------------- Vitals Details Patient Name: Date of Service: [name], [name] 03/04/2023 1:45 PM Medical Record Number: [id number] Patient Account Number: [id number] Date of Birth/Sex: Treating RN: 1932/06/22 (89 y.o. M) Primary Care Lucifer Soja: Alcide Clever Other Clinician: Referring Wylee Dorantes: Treating Jes Costales/Extender: Duanne Guess [facility], MICHELLE Weeks in Treatment: 7 Vital  Signs Time Taken: 01:48 Temperature (F): 98.8 Height (in): 72 Pulse (bpm): 91 Weight (lbs): 198 Respiratory Rate (breaths/min): 18 Body Mass Index (BMI): 26.9 Blood Pressure (mmHg): 156/98 Reference Range: 80 - 120 mg / dl Electronic Signature(s) Signed: 03/04/2023 2:18:23 PM By: Dayton Scrape Entered By: Dayton Scrape on 03/04/2023 13:48:52

## 2023-03-04 NOTE — Progress Notes (Addendum)
MONTAVIS, SCHUBRING ([id number]) [id number] Page 1 of 10 Visit Report for 03/04/2023 Chief Complaint Document Details Patient Name: Date of Service: [name] 03/04/2023 1:45 PM Medical Record Number: [id number] Patient Account Number: [id number] Date of Birth/Sex: Treating RN: 25-Jul-1932 (89 y.o. M) Primary Care Provider: Alcide Clever Other Clinician: Referring Provider: Treating Provider/Extender: Duanne Guess [facility], MICHELLE Weeks in Treatment: 7 Information Obtained from: Patient Chief Complaint Patient presents for treatment of an open ulcer due to venous insufficiency Electronic Signature(s) Signed: 03/04/2023 2:21:10 PM By: Duanne Guess MD FACS Entered By: Duanne Guess on 03/04/2023 14:21:10 -------------------------------------------------------------------------------- Debridement Details Patient Name: Date of Service: [name], Rashied E. 03/04/2023 1:45 PM Medical Record Number: [id number] Patient Account Number: [id number] Date of Birth/Sex: Treating RN: 01-Jul-1932 (89 y.o. Cline Cools Primary Care Provider: Alcide Clever Other Clinician: Referring Provider: Treating Provider/Extender: Duanne Guess [facility], MICHELLE Weeks in Treatment: 7 Debridement Performed for Assessment: Wound #2 Right,Medial Ankle Performed By: Physician Duanne Guess, MD Debridement Type: Debridement Severity of Tissue Pre Debridement: Fat layer exposed Level of Consciousness (Pre-procedure): Awake and Alert Pre-procedure Verification/Time Out Yes - 14:10 Taken: Start Time: 14:15 Pain Control: Lidocaine 5% topical ointment Percent of Wound Bed Debrided: 100% T Area Debrided (cm): otal 2.51 Tissue and other material debrided: Non-Viable, Slough, Slough Level: Non-Viable Tissue Debridement Description: Selective/Open Wound Instrument: Curette Bleeding: Minimum Hemostasis Achieved: Pressure Response to Treatment: Procedure  was tolerated well Level of Consciousness (Post- Awake and Alert procedure): Post Debridement Measurements of Total Wound Length: (cm) 1.6 Width: (cm) 2 Depth: (cm) 0.1 Volume: (cm) 0.251 Character of Wound/Ulcer Post Debridement: Improved Severity of Tissue Post Debridement: Fat layer exposed Post Procedure Diagnosis PATRICIA, PERALES E ([id number]) [id number] Page 2 of 10 Same as Pre-procedure Notes Scribed for Dr Lady Gary by Redmond Pulling, RN Electronic Signature(s) Signed: 03/04/2023 3:58:48 PM By: Duanne Guess MD FACS Signed: 03/04/2023 4:30:58 PM By: Redmond Pulling RN, BSN Entered By: Redmond Pulling on 03/04/2023 14:46:34 -------------------------------------------------------------------------------- HPI Details Patient Name: Date of Service: [name], Ascher E. 03/04/2023 1:45 PM Medical Record Number: [id number] Patient Account Number: [id number] Date of Birth/Sex: Treating RN: May 31, 1932 (89 y.o. M) Primary Care Provider: Alcide Clever Other Clinician: Referring Provider: Treating Provider/Extender: Duanne Guess [facility], MICHELLE Weeks in Treatment: 7 History of Present Illness HPI Description: ADMISSION 01/11/2023 This is an 89 year old nondiabetic with a history of chronic venous insufficiency, coronary artery disease, congestive heart failure, and lower extremity edema. He has historically been followed by podiatry for various ulcers and calluses on his feet. When he was seen in the middle of April, he was noted to have an ulcer starting on his left ankle. He was prescribed a course of cephalexin and advised to apply Betadine to the wound with an Ace wrap. He was referred to the wound care center for further evaluation and management. Apparently the wound closed of its own accord prior to his appointment with [place], but subsequently reopened. The notes from podiatry do indicate that compression and elevation were discussed as means to help  heal ulceration and avoid future episodes. After the wound reopened, he was seen again and prescribed a course of Augmentin. He is now here for his wound care appointment. 01/21/2023: The wound is smaller and more superficial. It is fairly clean with just a little bit of light slough on the surface. His venous reflux studies are scheduled for this coming Thursday. 01/29/2023: The wound is smaller again today and very superficial. There is minimal slough present with a  little eschar around the edges. His venous reflux studies were completed, and as expected, he has severe venous reflux. 02/07/2023: His wound is very nearly closed. He has an appointment coming up with vascular surgery on July 8. He indicated a callus on the bottom of his left foot that is bothering him today. 02/15/2023: There is just a pinhole opening remaining on the left, but unfortunately, he has had a new ulceration occur on the medial aspect of his right lower leg. The fat layer is exposed. He does have 2+ pitting edema in this leg. He says he noted fluid coming from the site a couple of days ago. He does not wear compression garments due to discomfort with stockings, but he has never received juxta lite or Farrow wrap garments. 02/22/2023: His left leg has healed. On the right, the open area has contracted considerably. There is some slough and eschar present. He did meet with vascular surgery for his initial consultation and will return in 3 months to discuss saphenous vein ablation. 03/04/2023: His wraps slid down bilaterally and he is leaking clear fluid from the left leg. There is slough build up on the right lower leg wound, but the overall size is smaller. Electronic Signature(s) Signed: 03/04/2023 2:23:58 PM By: Duanne Guess MD FACS Previous Signature: 03/04/2023 2:23:19 PM Version By: Duanne Guess MD FACS Entered By: Duanne Guess on 03/04/2023  14:23:57 -------------------------------------------------------------------------------- Physical Exam Details Patient Name: Date of Service: [name], Thaddius E. 03/04/2023 1:45 PM Medical Record Number: [id number] Patient Account Number: [id number] Date of Birth/Sex: Treating RN: 05-12-1932 (89 y.o. M) Primary Care Provider: Alcide Clever Other Clinician: Referring Provider: Treating Provider/Extender: Duanne Guess [facility], MICHELLE Weeks in Treatment: [address] MICHA, DOSANJH E ([id number]) [id number] Page 3 of 10 Constitutional Hypertensive, asymptomatic. . . . no acute distress. Respiratory Normal work of breathing on room air. Notes 03/04/2023: His wraps slid down bilaterally and he is leaking clear fluid from the left leg. There is slough build up on the right lower leg wound, but the overall size is smaller. Electronic Signature(s) Signed: 03/04/2023 2:24:29 PM By: Duanne Guess MD FACS Entered By: Duanne Guess on 03/04/2023 14:24:29 -------------------------------------------------------------------------------- Physician Orders Details Patient Name: Date of Service: [name], Sarath E. 03/04/2023 1:45 PM Medical Record Number: [id number] Patient Account Number: [id number] Date of Birth/Sex: Treating RN: Jun 09, 1932 (89 y.o. Cline Cools Primary Care Provider: Alcide Clever Other Clinician: Referring Provider: Treating Provider/Extender: Duanne Guess [facility], MICHELLE Weeks in Treatment: 7 Verbal / Phone Orders: No Diagnosis Coding ICD-10 Coding Code Description [phone] Non-pressure chronic ulcer of other part of right lower leg with fat layer exposed I87.2 Venous insufficiency (chronic) (peripheral) I50.9 Heart failure, unspecified R60.0 Localized edema I25.10 Atherosclerotic heart disease of native coronary artery without angina pectoris L84 Corns and callosities Follow-up Appointments ppointment in 1 week. - Dr.  Lady Gary RM 1 Return A BRING FARROW WRAPS TO NEXT APPOINTMENT PLEASE. Bathing/ Shower/ Hygiene May shower with protection but do not get wound dressing(s) wet. Protect dressing(s) with water repellant cover (for example, large plastic bag) or a cast cover and may then take shower. Edema Control - Lymphedema / SCD / Other Elevate legs to the level of the heart or above for 30 minutes daily and/or when sitting for 3-4 times a day throughout the day. - throughout the day Avoid standing for long periods of time. Exercise regularly Compression stocking or Garment 20-30 mm/Hg pressure to: - right leg daily Wound Treatment Wound #2 - Ankle Wound Laterality: Right,  Medial Cleanser: Soap and Water 1 x Per Week/30 Days Discharge Instructions: May shower and wash wound with dial antibacterial soap and water prior to dressing change. Cleanser: Wound Cleanser 1 x Per Week/30 Days Discharge Instructions: Cleanse the wound with wound cleanser prior to applying a clean dressing using gauze sponges, not tissue or cotton balls. Peri-Wound Care: Zinc Oxide Ointment 30g tube 1 x Per Week/30 Days Discharge Instructions: Apply Zinc Oxide to periwound with each dressing change Peri-Wound Care: Sween Lotion (Moisturizing lotion) 1 x Per Week/30 Days Discharge Instructions: Apply moisturizing lotion as directed BALDO, HUFNAGLE ([id number]) [id number] Page 4 of 10 Prim Dressing: Maxorb Extra Ag+ Alginate Dressing, 2x2 (in/in) 1 x Per Week/30 Days ary Discharge Instructions: Apply to wound bed as instructed Secondary Dressing: Woven Gauze Sponge, Non-Sterile 4x4 in 1 x Per Week/30 Days Discharge Instructions: Apply over primary dressing as directed. Compression Wrap: Urgo K2 Lite, (equivalent to a 3 layer) two layer compression system, regular 1 x Per Week/30 Days Discharge Instructions: Apply Urgo K2 Lite as directed (alternative to 3 layer compression). Compression Stockings: Jobst  Farrow Wrap 4000 Right Leg Compression Amount: 30-40 mmHG Discharge Instructions: Apply Renee Pain daily as instructed. Apply first thing in the morning, remove at night before bed. Wound #3 - Lower Leg Wound Laterality: Left, Anterior Cleanser: Soap and Water 1 x Per Week/15 Days Discharge Instructions: May shower and wash wound with dial antibacterial soap and water prior to dressing change. Prim Dressing: Maxorb Extra Ag+ Alginate Dressing, 2x2 (in/in) 1 x Per Week/15 Days ary Discharge Instructions: Apply to wound bed as instructed Secondary Dressing: ABD Pad, 8x10 1 x Per Week/15 Days Discharge Instructions: Apply over primary dressing as directed. Compression Wrap: Kerlix Roll 4.5x3.1 (in/yd) 1 x Per Week/15 Days Discharge Instructions: Apply Kerlix and Coban compression as directed. Compression Wrap: Coban Self-Adherent Wrap 4x5 (in/yd) 1 x Per Week/15 Days Discharge Instructions: Apply over Kerlix as directed. Compression Wrap: Unna Boot w/zinc, 4x10 (in/yd) 1 x Per Week/15 Days Patient Medications llergies: amlodipine, Lyrica, gabapentin A Notifications Medication Indication Start End 03/04/2023 lidocaine DOSE topical 5 % ointment - ointment topical once daily Electronic Signature(s) Signed: 03/04/2023 3:58:48 PM By: Duanne Guess MD FACS Signed: 03/04/2023 4:30:58 PM By: Redmond Pulling RN, BSN Entered By: Redmond Pulling on 03/04/2023 14:47:18 -------------------------------------------------------------------------------- Problem List Details Patient Name: Date of Service: [name], Tank E. 03/04/2023 1:45 PM Medical Record Number: [id number] Patient Account Number: [id number] Date of Birth/Sex: Treating RN: March 27, 1932 (89 y.o. M) Primary Care Provider: Alcide Clever Other Clinician: Referring Provider: Treating Provider/Extender: Duanne Guess [facility], MICHELLE Weeks in Treatment: 7 Active Problems ICD-10 Encounter Code Description Active Date  MDM Diagnosis L97.812 Non-pressure chronic ulcer of other part of right lower leg with fat layer 02/15/2023 No Yes exposed I87.2 Venous insufficiency (chronic) (peripheral) 01/11/2023 No Yes VUK, SKILLERN E ([id number]) [id number] Page 5 of 10 I50.9 Heart failure, unspecified 01/11/2023 No Yes R60.0 Localized edema 01/11/2023 No Yes I25.10 Atherosclerotic heart disease of native coronary artery without angina pectoris 01/11/2023 No Yes L84 Corns and callosities 02/07/2023 No Yes Inactive Problems Resolved Problems ICD-10 Code Description Active Date Resolved Date L97.322 Non-pressure chronic ulcer of left ankle with fat layer exposed 01/11/2023 01/11/2023 Electronic Signature(s) Signed: 03/04/2023 2:20:54 PM By: Duanne Guess MD FACS Entered By: Duanne Guess on 03/04/2023 14:20:54 -------------------------------------------------------------------------------- Progress Note Details Patient Name: Date of Service: [name], Fadil E. 03/04/2023 1:45 PM Medical Record Number: [id number] Patient Account Number: [id number] Date of Birth/Sex: Treating RN: 26-Jul-1932 (  89 y.o. M) Primary Care Provider: Alcide Clever Other Clinician: Referring Provider: Treating Provider/Extender: Duanne Guess [facility], MICHELLE Weeks in Treatment: 7 Subjective Chief Complaint Information obtained from Patient Patient presents for treatment of an open ulcer due to venous insufficiency History of Present Illness (HPI) ADMISSION 01/11/2023 This is an 89 year old nondiabetic with a history of chronic venous insufficiency, coronary artery disease, congestive heart failure, and lower extremity edema. He has historically been followed by podiatry for various ulcers and calluses on his feet. When he was seen in the middle of April, he was noted to have an ulcer starting on his left ankle. He was prescribed a course of cephalexin and advised to apply Betadine to the wound with an  Ace wrap. He was referred to the wound care center for further evaluation and management. Apparently the wound closed of its own accord prior to his appointment with [place], but subsequently reopened. The notes from podiatry do indicate that compression and elevation were discussed as means to help heal ulceration and avoid future episodes. After the wound reopened, he was seen again and prescribed a course of Augmentin. He is now here for his wound care appointment. 01/21/2023: The wound is smaller and more superficial. It is fairly clean with just a little bit of light slough on the surface. His venous reflux studies are scheduled for this coming Thursday. 01/29/2023: The wound is smaller again today and very superficial. There is minimal slough present with a little eschar around the edges. His venous reflux studies were completed, and as expected, he has severe venous reflux. 02/07/2023: His wound is very nearly closed. He has an appointment coming up with vascular surgery on July 8. He indicated a callus on the bottom of his left foot that is bothering him today. 02/15/2023: There is just a pinhole opening remaining on the left, but unfortunately, he has had a new ulceration occur on the medial aspect of his right lower leg. The fat layer is exposed. He does have 2+ pitting edema in this leg. He says he noted fluid coming from the site a couple of days ago. He does not wear HERBY, AMICK ([id number]) [id number] Page 6 of 10 compression garments due to discomfort with stockings, but he has never received juxta lite or Farrow wrap garments. 02/22/2023: His left leg has healed. On the right, the open area has contracted considerably. There is some slough and eschar present. He did meet with vascular surgery for his initial consultation and will return in 3 months to discuss saphenous vein ablation. 03/04/2023: His wraps slid down bilaterally and he is leaking clear fluid from  the left leg. There is slough build up on the right lower leg wound, but the overall size is smaller. Patient History Information obtained from Patient, Chart. Family History Lung Disease - Siblings, Stroke - Siblings, No family history of Cancer, Diabetes, Heart Disease, Hereditary Spherocytosis, Hypertension, Kidney Disease, Seizures, Thyroid Problems, Tuberculosis. Social History Former smoker, Marital Status - Married, Alcohol Use - Moderate, Drug Use - No History, Caffeine Use - Rarely. Medical History Eyes Patient has history of Cataracts - bil extractions Denies history of Glaucoma, Optic Neuritis Ear/Nose/Mouth/Throat Denies history of Chronic sinus problems/congestion, Middle ear problems Cardiovascular Patient has history of Congestive Heart Failure, Coronary Artery Disease, Hypertension, Peripheral Venous Disease Endocrine Denies history of Type I Diabetes, Type II Diabetes Integumentary (Skin) Denies history of History of Burn Musculoskeletal Patient has history of Osteoarthritis Neurologic Patient has history of Neuropathy Oncologic Denies history of  Received Chemotherapy, Received Radiation Psychiatric Denies history of Anorexia/bulimia, Confinement Anxiety Hospitalization/Surgery History - bil carpal tunnel release. - cardiac cath. - kyphoplasty. - ORIF patella left. - left total knee arthroplasty. - posterior lumbar fusion. - lumbar laminectomy. - posterior cervical fusion. - appendectomy. - blepharoplasty. - cholecystectomy. - bil catract extraction. - right knee replacement. - melanoma excision. - rectal surgery fissure. - vitrectomy. Medical A Surgical History Notes nd Ear/Nose/Mouth/Throat seasonal allergies Musculoskeletal myositis, myalgia, left patella fracture, bursitis of hips, lumbar compression fx, spondylolistthesis Neurologic bil carpal tunnel Oncologic skin cancers Objective Constitutional Hypertensive, asymptomatic. no acute distress. Vitals  Time Taken: 1:48 AM, Height: 72 in, Weight: 198 lbs, BMI: 26.9, Temperature: 98.8 F, Pulse: 91 bpm, Respiratory Rate: 18 breaths/min, Blood Pressure: 156/98 mmHg. Respiratory Normal work of breathing on room air. General Notes: 03/04/2023: His wraps slid down bilaterally and he is leaking clear fluid from the left leg. There is slough build up on the right lower leg wound, but the overall size is smaller. Integumentary (Hair, Skin) Wound #2 status is Open. Original cause of wound was Gradually Appeared. The date acquired was: 02/12/2023. The wound has been in treatment 2 weeks. The wound is located on the Right,Medial Ankle. The wound measures 1.6cm length x 2cm width x 0.1cm depth; 2.513cm^2 area and 0.251cm^3 volume. There is a medium amount of serosanguineous drainage noted. Wound #3 status is Open. Original cause of wound was Gradually Appeared. The date acquired was: 03/04/2023. The wound is located on the Left,Anterior Lower Leg. The wound measures 3cm length x 5cm width x 0.1cm depth; 11.781cm^2 area and 1.178cm^3 volume. There is Fat Layer (Subcutaneous Tissue) exposed. There is no tunneling or undermining noted. There is a medium amount of serous drainage noted. There is large (67-100%) red granulation within the wound bed. There is a small (1-33%) amount of necrotic tissue within the wound bed including Adherent Slough. AVIYON, HOCEVAR ([id number]) [id number] Page 7 of 10 Assessment Active Problems ICD-10 Non-pressure chronic ulcer of other part of right lower leg with fat layer exposed Venous insufficiency (chronic) (peripheral) Heart failure, unspecified Localized edema Atherosclerotic heart disease of native coronary artery without angina pectoris Corns and callosities Procedures Wound #2 Pre-procedure diagnosis of Wound #2 is a Venous Leg Ulcer located on the Right,Medial Ankle .Severity of Tissue Pre Debridement is: Fat layer exposed. There was a  Selective/Open Wound Non-Viable Tissue Debridement with a total area of 2.51 sq cm performed by Duanne Guess, MD. With the following instrument(s): Curette to remove Non-Viable tissue/material. Material removed includes [facility] after achieving pain control using Lidocaine 5% topical ointment. No specimens were taken. A time out was conducted at 14:10, prior to the start of the procedure. A Minimum amount of bleeding was controlled with Pressure. The procedure was tolerated well. Post Debridement Measurements: 1.6cm length x 2cm width x 0.1cm depth; 0.251cm^3 volume. Character of Wound/Ulcer Post Debridement is improved. Severity of Tissue Post Debridement is: Fat layer exposed. Post procedure Diagnosis Wound #2: Same as Pre-Procedure General Notes: Scribed for Dr Lady Gary by Redmond Pulling, RN. Pre-procedure diagnosis of Wound #2 is a Venous Leg Ulcer located on the Right,Medial Ankle . There was a Three Layer Compression Therapy Procedure by Redmond Pulling, RN. Post procedure Diagnosis Wound #2: Same as Pre-Procedure Wound #3 Pre-procedure diagnosis of Wound #3 is a Venous Leg Ulcer located on the Left,Anterior Lower Leg . There was a Three Layer Compression Therapy Procedure by Redmond Pulling, RN. Post procedure Diagnosis Wound #3: Same as Pre-Procedure Plan  Follow-up Appointments: Return Appointment in 1 week. - Dr. Lady Gary RM 1 BRING FARROW WRAPS TO NEXT APPOINTMENT PLEASE. Bathing/ Shower/ Hygiene: May shower with protection but do not get wound dressing(s) wet. Protect dressing(s) with water repellant cover (for example, large plastic bag) or a cast cover and may then take shower. Edema Control - Lymphedema / SCD / Other: Elevate legs to the level of the heart or above for 30 minutes daily and/or when sitting for 3-4 times a day throughout the day. - throughout the day Avoid standing for long periods of time. Exercise regularly Compression stocking or Garment 20-30 mm/Hg pressure to: -  right leg daily The following medication(s) was prescribed: lidocaine topical 5 % ointment ointment topical once daily was prescribed at facility WOUND #2: - Ankle Wound Laterality: Right, Medial Cleanser: Soap and Water 1 x Per Week/30 Days Discharge Instructions: May shower and wash wound with dial antibacterial soap and water prior to dressing change. Cleanser: Wound Cleanser 1 x Per Week/30 Days Discharge Instructions: Cleanse the wound with wound cleanser prior to applying a clean dressing using gauze sponges, not tissue or cotton balls. Peri-Wound Care: Zinc Oxide Ointment 30g tube 1 x Per Week/30 Days Discharge Instructions: Apply Zinc Oxide to periwound with each dressing change Peri-Wound Care: Sween Lotion (Moisturizing lotion) 1 x Per Week/30 Days Discharge Instructions: Apply moisturizing lotion as directed Prim Dressing: Maxorb Extra Ag+ Alginate Dressing, 2x2 (in/in) 1 x Per Week/30 Days ary Discharge Instructions: Apply to wound bed as instructed Secondary Dressing: Woven Gauze Sponge, Non-Sterile 4x4 in 1 x Per Week/30 Days Discharge Instructions: Apply over primary dressing as directed. Com pression Wrap: Urgo K2 Lite, (equivalent to a 3 layer) two layer compression system, regular 1 x Per Week/30 Days Discharge Instructions: Apply Urgo K2 Lite as directed (alternative to 3 layer compression). Com pression Stockings: Jobst Farrow Wrap 4000 Compression Amount: 30-40 mmHg (right) Discharge Instructions: Apply Renee Pain daily as instructed. Apply first thing in the morning, remove at night before bed. WOUND #3: - Lower Leg Wound Laterality: Left, Anterior Cleanser: Soap and Water 1 x Per Week/15 Days Discharge Instructions: May shower and wash wound with dial antibacterial soap and water prior to dressing change. Prim Dressing: Maxorb Extra Ag+ Alginate Dressing, 2x2 (in/in) 1 x Per Week/15 Days ary Discharge Instructions: Apply to wound bed as instructed Secondary  Dressing: ABD Pad, 8x10 1 x Per Week/15 Days Discharge Instructions: Apply over primary dressing as directed. Com pression Wrap: Kerlix Roll 4.5x3.1 (in/yd) 1 x Per Week/15 Days Discharge Instructions: Apply Kerlix and Coban compression as directed. Com pression Wrap: Coban Self-Adherent Wrap 4x5 (in/yd) 1 x Per Week/15 Days Discharge Instructions: Apply over Kerlix as directed. ROC, STREETT ([id number]) [id number] Page 8 of 10 Compression Wrap: Unna Boot w/zinc, 4x10 (in/yd) 1 x Per Week/15 Days 03/04/2023: His wraps slid down bilaterally and he is leaking clear fluid from the left leg. There is slough build up on the right lower leg wound, but the overall size is smaller. I used a curette to debride the slough from the right lower leg wound. We will continue silver alginate with Urgo lite wrap. We will use Unna boot at the top to minimize slippage. On the left, there is no obvious open wound, but clearly there is a break in the skin due to the amount of drainage that has come out. We will use a zinc Unna boot as the contact layer to try and dry everything up with ABD pads then the  rest of the Unna boot over this. Follow-up in 1 week. Electronic Signature(s) Signed: 03/07/2023 9:55:51 AM By: Duanne Guess MD FACS Signed: 03/08/2023 4:29:32 PM By: Shawn Stall RN, BSN Previous Signature: 03/04/2023 2:26:43 PM Version By: Duanne Guess MD FACS Entered By: Shawn Stall on 03/07/2023 07:54:43 -------------------------------------------------------------------------------- HxROS Details Patient Name: Date of Service: [name], Kentarius E. 03/04/2023 1:45 PM Medical Record Number: [id number] Patient Account Number: [id number] Date of Birth/Sex: Treating RN: 12-28-1931 (89 y.o. M) Primary Care Provider: Alcide Clever Other Clinician: Referring Provider: Treating Provider/Extender: Duanne Guess [facility], MICHELLE Weeks in Treatment: 7 Information Obtained  From Patient Chart Eyes Medical History: Positive for: Cataracts - bil extractions Negative for: Glaucoma; Optic Neuritis Ear/Nose/Mouth/Throat Medical History: Negative for: Chronic sinus problems/congestion; Middle ear problems Past Medical History Notes: seasonal allergies Cardiovascular Medical History: Positive for: Congestive Heart Failure; Coronary Artery Disease; Hypertension; Peripheral Venous Disease Endocrine Medical History: Negative for: Type I Diabetes; Type II Diabetes Integumentary (Skin) Medical History: Negative for: History of Burn Musculoskeletal Medical History: Positive for: Osteoarthritis Past Medical History Notes: myositis, myalgia, left patella fracture, bursitis of hips, lumbar compression fx, spondylolistthesis Neurologic Medical History: Positive for: Neuropathy Past Medical History NotesSILVESTER, REIERSON ([id number]) [id number] Page 9 of 10 bil carpal tunnel Oncologic Medical History: Negative for: Received Chemotherapy; Received Radiation Past Medical History Notes: skin cancers Psychiatric Medical History: Negative for: Anorexia/bulimia; Confinement Anxiety HBO Extended History Items Eyes: Cataracts Immunizations Pneumococcal Vaccine: Received Pneumococcal Vaccination: No Implantable Devices No devices added Hospitalization / Surgery History Type of Hospitalization/Surgery bil carpal tunnel release cardiac cath kyphoplasty ORIF patella left left total knee arthroplasty posterior lumbar fusion lumbar laminectomy posterior cervical fusion appendectomy blepharoplasty cholecystectomy bil catract extraction right knee replacement melanoma excision rectal surgery fissure vitrectomy Family and Social History Cancer: No; Diabetes: No; Heart Disease: No; Hereditary Spherocytosis: No; Hypertension: No; Kidney Disease: No; Lung Disease: Yes - Siblings; Seizures: No; Stroke: Yes - Siblings; Thyroid  Problems: No; Tuberculosis: No; Former smoker; Marital Status - Married; Alcohol Use: Moderate; Drug Use: No History; Caffeine Use: Rarely; Financial Concerns: No; Food, Clothing or Shelter Needs: No; Support System Lacking: No; Transportation Concerns: No Electronic Signature(s) Signed: 03/04/2023 3:58:48 PM By: Duanne Guess MD FACS Entered By: Duanne Guess on 03/04/2023 14:24:04 -------------------------------------------------------------------------------- SuperBill Details Patient Name: Date of Service: [name], Scot E. 03/04/2023 Medical Record Number: [id number] Patient Account Number: [id number] Date of Birth/Sex: Treating RN: Jul 09, 1932 (89 y.o. M) Primary Care Provider: Alcide Clever Other Clinician: Referring Provider: Treating Provider/Extender: Duanne Guess [facility], MICHELLE Weeks in Treatment: 7 Diagnosis Coding ICD-10 Codes TARIS, GALINDO ([id number]) [id number] Page 10 of 10 Code Description [id number] Non-pressure chronic ulcer of other part of right lower leg with fat layer exposed I87.2 Venous insufficiency (chronic) (peripheral) I50.9 Heart failure, unspecified R60.0 Localized edema I25.10 Atherosclerotic heart disease of native coronary artery without angina pectoris L84 Corns and callosities Facility Procedures : CPT4 Code: [id number] Description: 97597 - DEBRIDE WOUND 1ST 20 SQ CM OR < ICD-10 Diagnosis Description 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 Modifier: ed Quantity: 1 Physician Procedures : CPT4 Code Description Modifier [id number] 99213 - WC PHYS LEVEL 3 - EST PT 25 ICD-10 Diagnosis Description 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ed I87.2 Venous insufficiency (chronic) (peripheral) R60.0  Localized edema I50.9 Heart failure, unspecified Quantity: 1 : [id number] 97597 - WC PHYS DEBR WO ANESTH 20 SQ CM ICD-10  Diagnosis  Description [id number] Non-pressure chronic ulcer of other part of right lower leg with fat layer exposed Quantity: 1 Electronic Signature(s) Signed: 03/04/2023 2:27:16 PM By: Duanne Guess MD FACS Entered By: Duanne Guess on 03/04/2023 14:27:16

## 2023-03-11 ENCOUNTER — Encounter (HOSPITAL_BASED_OUTPATIENT_CLINIC_OR_DEPARTMENT_OTHER): Payer: Medicare Other | Admitting: General Surgery

## 2023-03-11 DIAGNOSIS — L97812 Non-pressure chronic ulcer of other part of right lower leg with fat layer exposed: Secondary | ICD-10-CM | POA: Diagnosis not present

## 2023-03-12 NOTE — Progress Notes (Signed)
DEVONTEZ, ASCHER ([id number]) [id number] Page 1 of 9 Visit Report for 03/11/2023 Chief Complaint Document Details Patient Name: Date of Service: [place] 03/11/2023 12:30 PM Medical Record Number: [id number] Patient Account Number: [id number] Date of Birth/Sex: Treating RN: 07-18-1932 (89 y.o. Damaris Schooner Primary Care Provider: Alcide Clever Other Clinician: Referring Provider: Treating Provider/Extender: Duanne Guess [facility], MICHELLE Weeks in Treatment: 8 Information Obtained from: Patient Chief Complaint Patient presents for treatment of an open ulcer due to venous insufficiency Electronic Signature(s) Signed: 03/11/2023 1:34:51 PM By: Duanne Guess MD FACS Entered By: Duanne Guess on 03/11/2023 13:34:51 -------------------------------------------------------------------------------- HPI Details Patient Name: Date of Service: [name], [place] E. 03/11/2023 12:30 PM Medical Record Number: [id number] Patient Account Number: [id number] Date of Birth/Sex: Treating RN: 1931-08-27 (89 y.o. Damaris Schooner Primary Care Provider: Alcide Clever Other Clinician: Referring Provider: Treating Provider/Extender: Duanne Guess [facility], MICHELLE Weeks in Treatment: 8 History of Present Illness HPI Description: ADMISSION 01/11/2023 This is an 89 year old nondiabetic with a history of chronic venous insufficiency, coronary artery disease, congestive heart failure, and lower extremity edema. He has historically been followed by podiatry for various ulcers and calluses on his feet. When he was seen in the middle of April, he was noted to have an ulcer starting on his left ankle. He was prescribed a course of cephalexin and advised to apply Betadine to the wound with an Ace wrap. He was referred to the wound care center for further evaluation and management. Apparently the wound closed of its own accord prior to his appointment with  [place], but subsequently reopened. The notes from podiatry do indicate that compression and elevation were discussed as means to help heal ulceration and avoid future episodes. After the wound reopened, he was seen again and prescribed a course of Augmentin. He is now here for his wound care appointment. 01/21/2023: The wound is smaller and more superficial. It is fairly clean with just a little bit of light slough on the surface. His venous reflux studies are scheduled for this coming Thursday. 01/29/2023: The wound is smaller again today and very superficial. There is minimal slough present with a little eschar around the edges. His venous reflux studies were completed, and as expected, he has severe venous reflux. 02/07/2023: His wound is very nearly closed. He has an appointment coming up with vascular surgery on July 8. He indicated a callus on the bottom of his left foot that is bothering him today. 02/15/2023: There is just a pinhole opening remaining on the left, but unfortunately, he has had a new ulceration occur on the medial aspect of his right lower leg. The fat layer is exposed. He does have 2+ pitting edema in this leg. He says he noted fluid coming from the site a couple of days ago. He does not wear compression garments due to discomfort with stockings, but he has never received juxta lite or Farrow wrap garments. 02/22/2023: His left leg has healed. On the right, the open area has contracted considerably. There is some slough and eschar present. He did meet with vascular surgery for his initial consultation and will return in 3 months to discuss saphenous vein ablation. 03/04/2023: His wraps slid down bilaterally and he is leaking clear fluid from the left leg. There is slough build up on the right lower leg wound, but the overall size is smaller. 03/11/2023: The area on his left leg where he was leaking fluid has closed. The right lower leg wound is smaller and  clean. He does have his Farrow  wraps with him today. YADER, POLMAN ([id number]) [id number] Page 2 of 9 Electronic Signature(s) Signed: 03/11/2023 1:35:44 PM By: Duanne Guess MD FACS Entered By: Duanne Guess on 03/11/2023 13:35:44 -------------------------------------------------------------------------------- Physical Exam Details Patient Name: Date of Service: [name], Matthewjames E. 03/11/2023 12:30 PM Medical Record Number: [id number] Patient Account Number: [id number] Date of Birth/Sex: Treating RN: [birth year] (89 y.o. Damaris Schooner Primary Care Provider: Alcide Clever Other Clinician: Referring Provider: Treating Provider/Extender: Duanne Guess [facility], MICHELLE Weeks in Treatment: 8 Constitutional . . . . no acute distress. Respiratory Normal work of breathing on room air. Notes 03/11/2023: The area on his left leg where he was leaking fluid has closed. The right lower leg wound is smaller and clean. Electronic Signature(s) Signed: 03/11/2023 1:37:44 PM By: Duanne Guess MD FACS Entered By: Duanne Guess on 03/11/2023 13:37:44 -------------------------------------------------------------------------------- Physician Orders Details Patient Name: Date of Service: [name], [place] E. 03/11/2023 12:30 PM Medical Record Number: [id number] Patient Account Number: [id number] Date of Birth/Sex: Treating RN: 10/31/1931 (89 y.o. Damaris Schooner Primary Care Provider: Alcide Clever Other Clinician: Referring Provider: Treating Provider/Extender: Duanne Guess [facility], MICHELLE Weeks in Treatment: 8 Verbal / Phone Orders: No Diagnosis Coding ICD-10 Coding Code Description [phone] Non-pressure chronic ulcer of other part of right lower leg with fat layer exposed I87.2 Venous insufficiency (chronic) (peripheral) I50.9 Heart failure, unspecified R60.0 Localized edema I25.10 Atherosclerotic heart disease of native coronary artery without angina  pectoris L84 Corns and callosities Follow-up Appointments ppointment in 2 weeks. - Dr. Lady Gary RM 2 Return A Wed 8/14 @ 2:15 pm Nurse Visit: - RM 1 Wed 8/7 @ 12:30 pm Bathing/ Shower/ Hygiene May shower with protection but do not get wound dressing(s) wet. Protect dressing(s) with water repellant cover (for example, large plastic bag) or a cast cover and may then take shower. ADA, IRVIN ([id number]) [id number] Page 3 of 9 Edema Control - Lymphedema / SCD / Other Elevate legs to the level of the heart or above for 30 minutes daily and/or when sitting for 3-4 times a day throughout the day. - throughout the day Avoid standing for long periods of time. Exercise regularly Compression stocking or Garment 20-30 mm/Hg pressure to: - left leg daily Wound Treatment Wound #2 - Ankle Wound Laterality: Right, Medial Cleanser: Soap and Water 1 x Per Week/30 Days Discharge Instructions: May shower and wash wound with dial antibacterial soap and water prior to dressing change. Cleanser: Wound Cleanser 1 x Per Week/30 Days Discharge Instructions: Cleanse the wound with wound cleanser prior to applying a clean dressing using gauze sponges, not tissue or cotton balls. Peri-Wound Care: Zinc Oxide Ointment 30g tube 1 x Per Week/30 Days Discharge Instructions: Apply Zinc Oxide to periwound with each dressing change Peri-Wound Care: Sween Lotion (Moisturizing lotion) 1 x Per Week/30 Days Discharge Instructions: Apply moisturizing lotion as directed Prim Dressing: Maxorb Extra Ag+ Alginate Dressing, 2x2 (in/in) 1 x Per Week/30 Days ary Discharge Instructions: Apply to wound bed as instructed Secondary Dressing: Woven Gauze Sponge, Non-Sterile 4x4 in 1 x Per Week/30 Days Discharge Instructions: Apply over primary dressing as directed. Compression Wrap: Urgo K2 Lite, (equivalent to a 3 layer) two layer compression system, regular 1 x Per Week/30 Days Discharge Instructions:  Apply Urgo K2 Lite as directed (alternative to 3 layer compression). Compression Stockings: Jobst Farrow Wrap 4000 Right Leg Compression Amount: 30-40 mmHG Discharge Instructions: Apply Renee Pain daily as instructed. Apply first thing in the morning,  remove at night before bed. Electronic Signature(s) Signed: 03/11/2023 1:51:28 PM By: Duanne Guess MD FACS Entered By: Duanne Guess on 03/11/2023 13:38:05 -------------------------------------------------------------------------------- Problem List Details Patient Name: Date of Service: [name], [place] E. 03/11/2023 12:30 PM Medical Record Number: [id number] Patient Account Number: [id number] Date of Birth/Sex: Treating RN: [birth year] (89 y.o. Damaris Schooner Primary Care Provider: Alcide Clever Other Clinician: Referring Provider: Treating Provider/Extender: Duanne Guess [facility], MICHELLE Weeks in Treatment: 8 Active Problems ICD-10 Encounter Code Description Active Date MDM Diagnosis L97.812 Non-pressure chronic ulcer of other part of right lower leg with fat layer 02/15/2023 No Yes exposed I87.2 Venous insufficiency (chronic) (peripheral) 01/11/2023 No Yes I50.9 Heart failure, unspecified 01/11/2023 No Yes R60.0 Localized edema 01/11/2023 No Yes RYLYN, DYAS ([id number]) [id number] Page 4 of 9 I25.10 Atherosclerotic heart disease of native coronary artery without angina pectoris 01/11/2023 No Yes L84 Corns and callosities 02/07/2023 No Yes Inactive Problems Resolved Problems ICD-10 Code Description Active Date Resolved Date L97.322 Non-pressure chronic ulcer of left ankle with fat layer exposed 01/11/2023 01/11/2023 Electronic Signature(s) Signed: 03/11/2023 1:34:38 PM By: Duanne Guess MD FACS Entered By: Duanne Guess on 03/11/2023 13:34:38 -------------------------------------------------------------------------------- Progress Note Details Patient Name: Date of Service: [name], [place] E. 03/11/2023 12:30 PM Medical Record Number: [id number] Patient Account Number: [id number] Date of Birth/Sex: Treating RN: Apr 17, 1932 (89 y.o. Damaris Schooner Primary Care Provider: Alcide Clever Other Clinician: Referring Provider: Treating Provider/Extender: Duanne Guess [facility], MICHELLE Weeks in Treatment: 8 Subjective Chief Complaint Information obtained from Patient Patient presents for treatment of an open ulcer due to venous insufficiency History of Present Illness (HPI) ADMISSION 01/11/2023 This is an 89 year old nondiabetic with a history of chronic venous insufficiency, coronary artery disease, congestive heart failure, and lower extremity edema. He has historically been followed by podiatry for various ulcers and calluses on his feet. When he was seen in the middle of April, he was noted to have an ulcer starting on his left ankle. He was prescribed a course of cephalexin and advised to apply Betadine to the wound with an Ace wrap. He was referred to the wound care center for further evaluation and management. Apparently the wound closed of its own accord prior to his appointment with [place], but subsequently reopened. The notes from podiatry do indicate that compression and elevation were discussed as means to help heal ulceration and avoid future episodes. After the wound reopened, he was seen again and prescribed a course of Augmentin. He is now here for his wound care appointment. 01/21/2023: The wound is smaller and more superficial. It is fairly clean with just a little bit of light slough on the surface. His venous reflux studies are scheduled for this coming Thursday. 01/29/2023: The wound is smaller again today and very superficial. There is minimal slough present with a little eschar around the edges. His venous reflux studies were completed, and as expected, he has severe venous reflux. 02/07/2023: His wound is very nearly closed. He has an appointment  coming up with vascular surgery on July 8. He indicated a callus on the bottom of his left foot that is bothering him today. 02/15/2023: There is just a pinhole opening remaining on the left, but unfortunately, he has had a new ulceration occur on the medial aspect of his right lower leg. The fat layer is exposed. He does have 2+ pitting edema in this leg. He says he noted fluid coming from the site a couple of days ago. He does not wear compression  garments due to discomfort with stockings, but he has never received juxta lite or Farrow wrap garments. 02/22/2023: His left leg has healed. On the right, the open area has contracted considerably. There is some slough and eschar present. He did meet with vascular surgery for his initial consultation and will return in 3 months to discuss saphenous vein ablation. 03/04/2023: His wraps slid down bilaterally and he is leaking clear fluid from the left leg. There is slough build up on the right lower leg wound, but the overall size is smaller. 03/11/2023: The area on his left leg where he was leaking fluid has closed. The right lower leg wound is smaller and clean. He does have his Farrow wraps with him today. TROTTER, REMSON ([id number]) [id number] Page 5 of 9 Patient History Information obtained from Patient, Chart. Family History Lung Disease - Siblings, Stroke - Siblings, No family history of Cancer, Diabetes, Heart Disease, Hereditary Spherocytosis, Hypertension, Kidney Disease, Seizures, Thyroid Problems, Tuberculosis. Social History Former smoker, Marital Status - Married, Alcohol Use - Moderate, Drug Use - No History, Caffeine Use - Rarely. Medical History Eyes Patient has history of Cataracts - bil extractions Denies history of Glaucoma, Optic Neuritis Ear/Nose/Mouth/Throat Denies history of Chronic sinus problems/congestion, Middle ear problems Cardiovascular Patient has history of Congestive Heart Failure,  Coronary Artery Disease, Hypertension, Peripheral Venous Disease Endocrine Denies history of Type I Diabetes, Type II Diabetes Integumentary (Skin) Denies history of History of Burn Musculoskeletal Patient has history of Osteoarthritis Neurologic Patient has history of Neuropathy Oncologic Denies history of Received Chemotherapy, Received Radiation Psychiatric Denies history of Anorexia/bulimia, Confinement Anxiety Hospitalization/Surgery History - bil carpal tunnel release. - cardiac cath. - kyphoplasty. - ORIF patella left. - left total knee arthroplasty. - posterior lumbar fusion. - lumbar laminectomy. - posterior cervical fusion. - appendectomy. - blepharoplasty. - cholecystectomy. - bil catract extraction. - right knee replacement. - melanoma excision. - rectal surgery fissure. - vitrectomy. Medical A Surgical History Notes nd Ear/Nose/Mouth/Throat seasonal allergies Musculoskeletal myositis, myalgia, left patella fracture, bursitis of hips, lumbar compression fx, spondylolistthesis Neurologic bil carpal tunnel Oncologic skin cancers Objective Constitutional no acute distress. Vitals Time Taken: 12:46 PM, Height: 72 in, Weight: 198 lbs, BMI: 26.9, Temperature: 98.3 F, Pulse: 75 bpm, Respiratory Rate: 18 breaths/min, Blood Pressure: 130/73 mmHg. Respiratory Normal work of breathing on room air. General Notes: 03/11/2023: The area on his left leg where he was leaking fluid has closed. The right lower leg wound is smaller and clean. Integumentary (Hair, Skin) Wound #2 status is Open. Original cause of wound was Gradually Appeared. The date acquired was: 02/12/2023. The wound has been in treatment 3 weeks. The wound is located on the Right,Medial Ankle. The wound measures 1.4cm length x 1.1cm width x 0.1cm depth; 1.21cm^2 area and 0.121cm^3 volume. There is Fat Layer (Subcutaneous Tissue) exposed. There is no tunneling or undermining noted. There is a medium amount of  serosanguineous drainage noted. The wound margin is flat and intact. There is medium (34-66%) red granulation within the wound bed. There is a medium (34-66%) amount of necrotic tissue within the wound bed including Adherent Slough. The periwound skin appearance had no abnormalities noted for texture. The periwound skin appearance exhibited: Dry/Scaly, Hemosiderin Staining. Periwound temperature was noted as No Abnormality. Wound #3 status is Healed - Epithelialized. Original cause of wound was Gradually Appeared. The date acquired was: 03/04/2023. The wound has been in treatment 1 weeks. The wound is located on the Left,Anterior Lower Leg. The wound measures 0cm length  x 0cm width x 0cm depth; 0cm^2 area and 0cm^3 volume. There is Fat Layer (Subcutaneous Tissue) exposed. There is no tunneling or undermining noted. There is a small amount of serous drainage noted. The wound margin is flat and intact. There is small (1-33%) red granulation within the wound bed. There is no necrotic tissue within the wound bed. The periwound skin appearance had no abnormalities noted for texture. The periwound skin appearance had no abnormalities noted for moisture. The periwound skin appearance exhibited: Hemosiderin Staining. Periwound temperature was noted as No Abnormality. LEVENT, OLVER ([id number]) [id number] Page 6 of 9 Assessment Active Problems ICD-10 Non-pressure chronic ulcer of other part of right lower leg with fat layer exposed Venous insufficiency (chronic) (peripheral) Heart failure, unspecified Localized edema Atherosclerotic heart disease of native coronary artery without angina pectoris Corns and callosities Procedures Wound #2 Pre-procedure diagnosis of Wound #2 is a Venous Leg Ulcer located on the Right,Medial Ankle . There was a Double Layer Compression Therapy Procedure by Zenaida Deed, RN. Post procedure Diagnosis Wound #2: Same as Pre-Procedure Notes:  urgo lite. Plan Follow-up Appointments: Return Appointment in 2 weeks. - Dr. Lady Gary RM 2 Wed 8/14 @ 2:15 pm Nurse Visit: - RM 1 Wed 8/7 @ 12:30 pm Bathing/ Shower/ Hygiene: May shower with protection but do not get wound dressing(s) wet. Protect dressing(s) with water repellant cover (for example, large plastic bag) or a cast cover and may then take shower. Edema Control - Lymphedema / SCD / Other: Elevate legs to the level of the heart or above for 30 minutes daily and/or when sitting for 3-4 times a day throughout the day. - throughout the day Avoid standing for long periods of time. Exercise regularly Compression stocking or Garment 20-30 mm/Hg pressure to: - left leg daily WOUND #2: - Ankle Wound Laterality: Right, Medial Cleanser: Soap and Water 1 x Per Week/30 Days Discharge Instructions: May shower and wash wound with dial antibacterial soap and water prior to dressing change. Cleanser: Wound Cleanser 1 x Per Week/30 Days Discharge Instructions: Cleanse the wound with wound cleanser prior to applying a clean dressing using gauze sponges, not tissue or cotton balls. Peri-Wound Care: Zinc Oxide Ointment 30g tube 1 x Per Week/30 Days Discharge Instructions: Apply Zinc Oxide to periwound with each dressing change Peri-Wound Care: Sween Lotion (Moisturizing lotion) 1 x Per Week/30 Days Discharge Instructions: Apply moisturizing lotion as directed Prim Dressing: Maxorb Extra Ag+ Alginate Dressing, 2x2 (in/in) 1 x Per Week/30 Days ary Discharge Instructions: Apply to wound bed as instructed Secondary Dressing: Woven Gauze Sponge, Non-Sterile 4x4 in 1 x Per Week/30 Days Discharge Instructions: Apply over primary dressing as directed. Com pression Wrap: Urgo K2 Lite, (equivalent to a 3 layer) two layer compression system, regular 1 x Per Week/30 Days Discharge Instructions: Apply Urgo K2 Lite as directed (alternative to 3 layer compression). Com pression Stockings: Jobst Farrow Wrap  4000 Compression Amount: 30-40 mmHg (right) Discharge Instructions: Apply Renee Pain daily as instructed. Apply first thing in the morning, remove at night before bed. 03/11/2023: The area on his left leg where he was leaking fluid has closed. The right lower leg wound is smaller and clean. He does have his Farrow wraps with him today. No debridement was necessary today. We will continue silver alginate and Urgo lite compression wrap on his right leg. As he has his Fabian November wraps with him today, we will apply the left one and show him how to do it himself. He will follow-up in 1  week. Electronic Signature(s) Signed: 03/11/2023 1:38:57 PM By: Duanne Guess MD FACS Entered By: Duanne Guess on 03/11/2023 13:38:57 [name] ([id number]) [id number] Page 7 of 9 -------------------------------------------------------------------------------- HxROS Details Patient Name: Date of Service: [place] 03/11/2023 12:30 PM Medical Record Number: [id number] Patient Account Number: [id number] Date of Birth/Sex: Treating RN: Mar 22, 1932 (89 y.o. Damaris Schooner Primary Care Provider: Alcide Clever Other Clinician: Referring Provider: Treating Provider/Extender: Duanne Guess [facility], MICHELLE Weeks in Treatment: 8 Information Obtained From Patient Chart Eyes Medical History: Positive for: Cataracts - bil extractions Negative for: Glaucoma; Optic Neuritis Ear/Nose/Mouth/Throat Medical History: Negative for: Chronic sinus problems/congestion; Middle ear problems Past Medical History Notes: seasonal allergies Cardiovascular Medical History: Positive for: Congestive Heart Failure; Coronary Artery Disease; Hypertension; Peripheral Venous Disease Endocrine Medical History: Negative for: Type I Diabetes; Type II Diabetes Integumentary (Skin) Medical History: Negative for: History of Burn Musculoskeletal Medical History: Positive for:  Osteoarthritis Past Medical History Notes: myositis, myalgia, left patella fracture, bursitis of hips, lumbar compression fx, spondylolistthesis Neurologic Medical History: Positive for: Neuropathy Past Medical History Notes: bil carpal tunnel Oncologic Medical History: Negative for: Received Chemotherapy; Received Radiation Past Medical History Notes: skin cancers Psychiatric Medical History: Negative for: Anorexia/bulimia; Confinement Anxiety HBO Extended History Items Eyes: Cataracts Immunizations Pneumococcal Vaccine: Received Pneumococcal Vaccination: No Implantable Devices No devices added TRISTIAN, RAIRIGH ([id number]) [id number] Page 8 of 9 Hospitalization / Surgery History Type of Hospitalization/Surgery bil carpal tunnel release cardiac cath kyphoplasty ORIF patella left left total knee arthroplasty posterior lumbar fusion lumbar laminectomy posterior cervical fusion appendectomy blepharoplasty cholecystectomy bil catract extraction right knee replacement melanoma excision rectal surgery fissure vitrectomy Family and Social History Cancer: No; Diabetes: No; Heart Disease: No; Hereditary Spherocytosis: No; Hypertension: No; Kidney Disease: No; Lung Disease: Yes - Siblings; Seizures: No; Stroke: Yes - Siblings; Thyroid Problems: No; Tuberculosis: No; Former smoker; Marital Status - Married; Alcohol Use: Moderate; Drug Use: No History; Caffeine Use: Rarely; Financial Concerns: No; Food, Clothing or Shelter Needs: No; Support System Lacking: No; Transportation Concerns: No Psychologist, prison and probation services) Signed: 03/11/2023 1:51:28 PM By: Duanne Guess MD FACS Signed: 03/11/2023 5:48:36 PM By: Zenaida Deed RN, BSN Entered By: Duanne Guess on 03/11/2023 13:35:51 -------------------------------------------------------------------------------- SuperBill Details Patient Name: Date of Service: [name], [place] E. 03/11/2023 Medical  Record Number: [id number] Patient Account Number: [id number] Date of Birth/Sex: Treating RN: 12-17-1931 (89 y.o. Damaris Schooner Primary Care Provider: Alcide Clever Other Clinician: Referring Provider: Treating Provider/Extender: Duanne Guess [facility], MICHELLE Weeks in Treatment: 8 Diagnosis Coding ICD-10 Codes Code Description [phone] Non-pressure chronic ulcer of other part of right lower leg with fat layer exposed I87.2 Venous insufficiency (chronic) (peripheral) I50.9 Heart failure, unspecified R60.0 Localized edema I25.10 Atherosclerotic heart disease of native coronary artery without angina pectoris L84 Corns and callosities Facility Procedures : CPT4 Code: [id number] Description: (Facility Use Only) [id number] - APPLY MULTLAY COMPRS LWR RT LEG Modifier: Quantity: 1 Physician Procedures : CPT4 Code Description Modifier [id number] - WC PHYS LEVEL 3 - EST PT ICD-10 Diagnosis Description 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ed I87.2 Venous insufficiency (chronic) (peripheral) I50.9  Heart failure, unspecified R60.0 Localized edema JARRIS, RUECKER E ([id number]) [id number] Pag Quantity: 1 e 9 of 9 Electronic Signature(s) Signed: 03/11/2023 5:48:36 PM By: Zenaida Deed RN, BSN Signed: 03/12/2023 8:47:02 AM By: Duanne Guess MD FACS Previous Signature: 03/11/2023 1:39:47 PM Version By: Duanne Guess MD FACS Entered By: Zenaida Deed on 03/11/2023 17:14:51

## 2023-03-12 NOTE — Progress Notes (Signed)
BONNER, JANI ([id number]) [id number] Page 1 of 8 Visit Report for 03/11/2023 Arrival Information Details Patient Name: Date of Service: [place] 03/11/2023 12:30 PM Medical Record Number: [id number] Patient Account Number: [id number] Date of Birth/Sex: Treating RN: [birth year] (89 y.o. Damaris Schooner Primary Care Yader Criger: Alcide Clever Other Clinician: Referring Jasson Siegmann: Treating Maribell Demeo/Extender: Duanne Guess [facility], MICHELLE Weeks in Treatment: 8 Visit Information History Since Last Visit All ordered tests and consults were completed: No Patient Arrived: Dan Humphreys Added or deleted any medications: No Arrival Time: 12:46 Any new allergies or adverse reactions: No Accompanied By: self Had a fall or experienced change in No Transfer Assistance: None activities of daily living that may affect Patient Identification Verified: Yes risk of falls: Secondary Verification Process Completed: Yes Signs or symptoms of abuse/neglect since last visito No Patient Requires Transmission-Based Precautions: No Hospitalized since last visit: No Patient Has Alerts: No Implantable device outside of the clinic excluding No cellular tissue based products placed in the center since last visit: Has Dressing in Place as Prescribed: Yes Has Compression in Place as Prescribed: Yes Pain Present Now: No Electronic Signature(s) Signed: 03/11/2023 5:48:36 PM By: Zenaida Deed RN, BSN Entered By: Zenaida Deed on 03/11/2023 12:48:18 -------------------------------------------------------------------------------- Compression Therapy Details Patient Name: Date of Service: [name], Jermarion E. 03/11/2023 12:30 PM Medical Record Number: [id number] Patient Account Number: [id number] Date of Birth/Sex: Treating RN: [birth year] (89 y.o. Damaris Schooner Primary Care Cecilie Heidel: Alcide Clever Other Clinician: Referring Elmer Boutelle: Treating Graison Leinberger/Extender:  Duanne Guess [facility], MICHELLE Weeks in Treatment: 8 Compression Therapy Performed for Wound Assessment: Wound #2 Right,Medial Ankle Performed By: Clinician Zenaida Deed, RN Compression Type: Double Layer Post Procedure Diagnosis Same as Pre-procedure Notes urgo lite Electronic Signature(s) Signed: 03/11/2023 5:48:36 PM By: Zenaida Deed RN, BSN Entered By: Zenaida Deed on 03/11/2023 13:07:02 [name] ([id number]) [id number] Page 2 of 8 -------------------------------------------------------------------------------- Lower Extremity Assessment Details Patient Name: Date of Service: [place] 03/11/2023 12:30 PM Medical Record Number: [id number] Patient Account Number: [id number] Date of Birth/Sex: Treating RN: 1932-06-28 (89 y.o. Damaris Schooner Primary Care Nasif Bos: Alcide Clever Other Clinician: Referring Zakkary Thibault: Treating Cecile Gillispie/Extender: Duanne Guess [facility], MICHELLE Weeks in Treatment: 8 Edema Assessment Assessed: [Left: No] [Right: No] Edema: [Left: Yes] [Right: Yes] Calf Left: Right: Point of Measurement: From Medial Instep 38.5 cm 39 cm Ankle Left: Right: Point of Measurement: From Medial Instep 24.6 cm 23.8 cm Vascular Assessment Pulses: Dorsalis Pedis Palpable: [Left:No] [Right:No] Extremity colors, hair growth, and conditions: Extremity Color: [Left:Hyperpigmented] [Right:Hyperpigmented] Hair Growth on Extremity: [Left:No] [Right:No] Temperature of Extremity: [Left:Warm] [Right:Warm] Capillary Refill: [Left:< 3 seconds] [Right:< 3 seconds] Dependent Rubor: [Left:No No] [Right:No No] Electronic Signature(s) Signed: 03/11/2023 5:48:36 PM By: Zenaida Deed RN, BSN Entered By: Zenaida Deed on 03/11/2023 12:51:42 -------------------------------------------------------------------------------- Multi Wound Chart Details Patient Name: Date of Service: [name], Samy E. 03/11/2023 12:30  PM Medical Record Number: [id number] Patient Account Number: [id number] Date of Birth/Sex: Treating RN: 31-Oct-1931 (89 y.o. Damaris Schooner Primary Care Danyla Wattley: Alcide Clever Other Clinician: Referring Kysa Calais: Treating Lyncoln Maskell/Extender: Duanne Guess [facility], MICHELLE Weeks in Treatment: 8 Vital Signs Height(in): 72 Pulse(bpm): 75 Weight(lbs): 198 Blood Pressure(mmHg): 130/73 Body Mass Index(BMI): 26.9 Temperature(F): 98.3 Respiratory Rate(breaths/min): 18 [2:Photos:] [N/A:N/A [id number] Page 3 of 8] Right, Medial Ankle Left, Anterior Lower Leg N/A Wound Location: Gradually Appeared Gradually Appeared N/A Wounding Event: Venous Leg Ulcer Venous Leg Ulcer N/A Primary Etiology: Cataracts, Congestive Heart Failure, Cataracts, Congestive Heart Failure, N/A Comorbid History: Coronary  Artery Disease, Coronary Artery Disease, Hypertension, Peripheral Venous Hypertension, Peripheral Venous Disease, Osteoarthritis, Neuropathy Disease, Osteoarthritis, Neuropathy 02/12/2023 03/04/2023 N/A Date Acquired: 3 1 N/A Weeks of Treatment: Open Healed - Epithelialized N/A Wound Status: No No N/A Wound Recurrence: No Yes N/A Clustered Wound: N/A 3 N/A Clustered Quantity: 1.4x1.1x0.1 0x0x0 N/A Measurements L x W x D (cm) 1.21 0 N/A A (cm) : rea 0.121 0 N/A Volume (cm) : 84.70% 100.00% N/A % Reduction in Area: 84.70% 100.00% N/A % Reduction in Volume: Full Thickness Without Exposed Full Thickness Without Exposed N/A Classification: Support Structures Support Structures Medium Small N/A Exudate Amount: Serosanguineous Serous N/A Exudate Type: red, brown amber N/A Exudate Color: Flat and Intact Flat and Intact N/A Wound Margin: Medium (34-66%) Small (1-33%) N/A Granulation Amount: Red Red N/A Granulation Quality: Medium (34-66%) None Present (0%) N/A Necrotic Amount: Fat Layer (Subcutaneous Tissue): Yes Fat Layer (Subcutaneous Tissue):  Yes N/A Exposed Structures: Fascia: No Fascia: No Tendon: No Tendon: No Muscle: No Muscle: No Joint: No Joint: No Bone: No Bone: No Small (1-33%) Large (67-100%) N/A Epithelialization: No Abnormalities Noted No Abnormalities Noted N/A Periwound Skin Texture: Dry/Scaly: Yes No Abnormalities Noted N/A Periwound Skin Moisture: Hemosiderin Staining: Yes Hemosiderin Staining: Yes N/A Periwound Skin Color: No Abnormality No Abnormality N/A Temperature: Compression Therapy N/A N/A Procedures Performed: Treatment Notes Electronic Signature(s) Signed: 03/11/2023 1:34:45 PM By: Duanne Guess MD FACS Signed: 03/11/2023 5:48:36 PM By: Zenaida Deed RN, BSN Entered By: Duanne Guess on 03/11/2023 13:34:45 -------------------------------------------------------------------------------- Multi-Disciplinary Care Plan Details Patient Name: Date of Service: [name], Hasnain E. 03/11/2023 12:30 PM Medical Record Number: [id number] Patient Account Number: [id number] Date of Birth/Sex: Treating RN: 1931/11/11 (89 y.o. Damaris Schooner Primary Care Detrell Umscheid: Alcide Clever Other Clinician: Referring Felis Quillin: Treating Clebert Wenger/Extender: Duanne Guess [facility], MICHELLE Weeks in Treatment: 8 Multidisciplinary Care Plan reviewed with physician Active Inactive Venous Leg Ulcer Nursing Diagnoses: Knowledge deficit related to disease process and management VIRLAN, MAHNKEN ([id number]) [phone].pdf Page 4 of 8 Potential for venous Insuffiency (use before diagnosis confirmed) Goals: Patient will maintain optimal edema control Date Initiated: 01/11/2023 Target Resolution Date: 03/28/2023 Goal Status: Active Interventions: Assess peripheral edema status every visit. Compression as ordered Provide education on venous insufficiency Treatment Activities: Therapeutic compression applied : 01/11/2023 Notes: Wound/Skin Impairment Nursing Diagnoses: Impaired  tissue integrity Knowledge deficit related to ulceration/compromised skin integrity Goals: Patient/caregiver will verbalize understanding of skin care regimen Date Initiated: 01/11/2023 Target Resolution Date: 03/28/2023 Goal Status: Active Ulcer/skin breakdown will have a volume reduction of 30% by week 4 Date Initiated: 01/11/2023 Date Inactivated: 02/07/2023 Target Resolution Date: 02/08/2023 Goal Status: Met Interventions: Assess patient/caregiver ability to obtain necessary supplies Assess patient/caregiver ability to perform ulcer/skin care regimen upon admission and as needed Assess ulceration(s) every visit Provide education on ulcer and skin care Treatment Activities: Skin care regimen initiated : 01/11/2023 Topical wound management initiated : 01/11/2023 Notes: Electronic Signature(s) Signed: 03/11/2023 5:48:36 PM By: Zenaida Deed RN, BSN Entered By: Zenaida Deed on 03/11/2023 12:42:17 -------------------------------------------------------------------------------- Pain Assessment Details Patient Name: Date of Service: [name], Shakim E. 03/11/2023 12:30 PM Medical Record Number: [id number] Patient Account Number: [id number] Date of Birth/Sex: Treating RN: 11/24/1931 (89 y.o. Damaris Schooner Primary Care Bubba Vanbenschoten: Alcide Clever Other Clinician: Referring Jayline Kilburg: Treating Mirta Mally/Extender: Duanne Guess [facility], MICHELLE Weeks in Treatment: 8 Active Problems Location of Pain Severity and Description of Pain Patient Has Paino No Site Locations Rate the pain. ABIR, CAMDEN ([id number]) [id number] Page 5 of 8 Rate the pain.  Current Pain Level: 0 Pain Management and Medication Current Pain Management: Electronic Signature(s) Signed: 03/11/2023 5:48:36 PM By: Zenaida Deed RN, BSN Entered By: Zenaida Deed on 03/11/2023  12:48:33 -------------------------------------------------------------------------------- Patient/Caregiver Education Details Patient Name: Date of Service: [name], [name] 7/29/2024andnbsp12:30 PM Medical Record Number: [id number] Patient Account Number: [id number] Date of Birth/Gender: Treating RN: [birth year] (89 y.o. Damaris Schooner Primary Care Physician: Alcide Clever Other Clinician: Referring Physician: Treating Physician/Extender: Duanne Guess [facility], MICHELLE Weeks in Treatment: 8 Education Assessment Education Provided To: Patient Education Topics Provided Venous: Methods: Explain/Verbal Responses: Reinforcements needed, State content correctly Wound/Skin Impairment: Methods: Explain/Verbal Responses: Reinforcements needed, State content correctly Electronic Signature(s) Signed: 03/11/2023 5:48:36 PM By: Zenaida Deed RN, BSN Entered By: Zenaida Deed on 03/11/2023 12:45:19 -------------------------------------------------------------------------------- Wound Assessment Details Patient Name: Date of Service: [name], Ellard E. 03/11/2023 12:30 PM [name] ([id number]) [id number] Page 6 of 8 Medical Record Number: [id number] Patient Account Number: [id number] Date of Birth/Sex: Treating RN: April 14, 1932 (89 y.o. Damaris Schooner Primary Care Rondarius Kadrmas: Alcide Clever Other Clinician: Referring Saliah Crisp: Treating Mazikeen Hehn/Extender: Duanne Guess [facility], MICHELLE Weeks in Treatment: 8 Wound Status Wound Number: 2 Primary Venous Leg Ulcer Etiology: Wound Location: Right, Medial Ankle Wound Open Wounding Event: Gradually Appeared Status: Date Acquired: 02/12/2023 Comorbid Cataracts, Congestive Heart Failure, Coronary Artery Disease, Weeks Of Treatment: 3 History: Hypertension, Peripheral Venous Disease, Osteoarthritis, Clustered Wound: No Neuropathy Photos Wound Measurements Length: (cm) 1.4 Width: (cm)  1.1 Depth: (cm) 0.1 Area: (cm) 1.21 Volume: (cm) 0.121 % Reduction in Area: 84.7% % Reduction in Volume: 84.7% Epithelialization: Small (1-33%) Tunneling: No Undermining: No Wound Description Classification: Full Thickness Without Exposed Suppor Wound Margin: Flat and Intact Exudate Amount: Medium Exudate Type: Serosanguineous Exudate Color: red, brown t Structures Foul Odor After Cleansing: No Slough/Fibrino Yes Wound Bed Granulation Amount: Medium (34-66%) Exposed Structure Granulation Quality: Red Fascia Exposed: No Necrotic Amount: Medium (34-66%) Fat Layer (Subcutaneous Tissue) Exposed: Yes Necrotic Quality: Adherent Slough Tendon Exposed: No Muscle Exposed: No Joint Exposed: No Bone Exposed: No Periwound Skin Texture Texture Color No Abnormalities Noted: Yes No Abnormalities Noted: No Hemosiderin Staining: Yes Moisture No Abnormalities Noted: No Temperature / Pain Dry / Scaly: Yes Temperature: No Abnormality Electronic Signature(s) Signed: 03/11/2023 5:48:36 PM By: Zenaida Deed RN, BSN Entered By: Zenaida Deed on 03/11/2023 12:57:40 Wound Assessment Details -------------------------------------------------------------------------------- [name] ([id number]) [id number] Page 7 of 8 Patient Name: Date of Service: [place] 03/11/2023 12:30 PM Medical Record Number: [id number] Patient Account Number: [id number] Date of Birth/Sex: Treating RN: 1932-03-02 (89 y.o. Damaris Schooner Primary Care Xitlaly Ault: Alcide Clever Other Clinician: Referring Laberta Wilbon: Treating Bienvenido Proehl/Extender: Duanne Guess [facility], MICHELLE Weeks in Treatment: 8 Wound Status Wound Number: 3 Primary Venous Leg Ulcer Etiology: Wound Location: Left, Anterior Lower Leg Wound Healed - Epithelialized Wounding Event: Gradually Appeared Status: Date Acquired: 03/04/2023 Comorbid Cataracts, Congestive Heart Failure, Coronary Artery  Disease, Weeks Of Treatment: 1 History: Hypertension, Peripheral Venous Disease, Osteoarthritis, Clustered Wound: Yes Neuropathy Photos Wound Measurements Length: (cm) 0 % Reduction in Area: 100% Width: (cm) 0 % Reduction in Volume: 100% Depth: (cm) 0 Epithelialization: Large (67-100%) Clustered Quantity: 3 Tunneling: No Area: (cm) 0 Undermining: No Volume: (cm) 0 Wound Description Classification: Full Thickness Without Exposed Support Structures Foul Odor After Cleansing: No Wound Margin: Flat and Intact Slough/Fibrino No Exudate Amount: Small Exudate Type: Serous Exudate Color: amber Wound Bed Granulation Amount: Small (1-33%) Exposed Structure Granulation Quality: Red Fascia Exposed: No Necrotic Amount: None Present (0%)  Fat Layer (Subcutaneous Tissue) Exposed: Yes Tendon Exposed: No Muscle Exposed: No Joint Exposed: No Bone Exposed: No Periwound Skin Texture Texture Color No Abnormalities Noted: Yes No Abnormalities Noted: No Hemosiderin Staining: Yes Moisture No Abnormalities Noted: Yes Temperature / Pain Temperature: No Abnormality Electronic Signature(s) Signed: 03/11/2023 5:48:36 PM By: Zenaida Deed RN, BSN Entered By: Zenaida Deed on 03/11/2023 13:32:41 [name] ([id number]) [id number] Page 8 of 8 -------------------------------------------------------------------------------- Vitals Details Patient Name: Date of Service: [place] 03/11/2023 12:30 PM Medical Record Number: [id number] Patient Account Number: [id number] Date of Birth/Sex: Treating RN: Jan 04, 1932 (89 y.o. Damaris Schooner Primary Care Holiday Mcmenamin: Alcide Clever Other Clinician: Referring [employer] Co: Treating Aunna Snooks/Extender: Duanne Guess [facility], MICHELLE Weeks in Treatment: 8 Vital Signs Time Taken: 12:46 Temperature (F): 98.3 Height (in): 72 Pulse (bpm): 75 Weight (lbs): 198 Respiratory Rate (breaths/min): 18 Body Mass  Index (BMI): 26.9 Blood Pressure (mmHg): 130/73 Reference Range: 80 - 120 mg / dl Electronic Signature(s) Signed: 03/11/2023 5:48:36 PM By: Zenaida Deed RN, BSN Entered By: Zenaida Deed on 03/11/2023 12:48:22

## 2023-03-20 ENCOUNTER — Encounter (HOSPITAL_BASED_OUTPATIENT_CLINIC_OR_DEPARTMENT_OTHER): Payer: Medicare Other | Attending: Internal Medicine | Admitting: Physician Assistant

## 2023-03-20 DIAGNOSIS — L84 Corns and callosities: Secondary | ICD-10-CM | POA: Insufficient documentation

## 2023-03-20 DIAGNOSIS — L97822 Non-pressure chronic ulcer of other part of left lower leg with fat layer exposed: Secondary | ICD-10-CM | POA: Insufficient documentation

## 2023-03-20 DIAGNOSIS — I872 Venous insufficiency (chronic) (peripheral): Secondary | ICD-10-CM | POA: Diagnosis not present

## 2023-03-20 DIAGNOSIS — I509 Heart failure, unspecified: Secondary | ICD-10-CM | POA: Diagnosis not present

## 2023-03-20 DIAGNOSIS — I2510 Atherosclerotic heart disease of native coronary artery without angina pectoris: Secondary | ICD-10-CM | POA: Diagnosis not present

## 2023-03-20 DIAGNOSIS — L97812 Non-pressure chronic ulcer of other part of right lower leg with fat layer exposed: Secondary | ICD-10-CM | POA: Insufficient documentation

## 2023-03-20 DIAGNOSIS — M1990 Unspecified osteoarthritis, unspecified site: Secondary | ICD-10-CM | POA: Insufficient documentation

## 2023-03-20 DIAGNOSIS — Z87891 Personal history of nicotine dependence: Secondary | ICD-10-CM | POA: Diagnosis not present

## 2023-03-20 DIAGNOSIS — L97321 Non-pressure chronic ulcer of left ankle limited to breakdown of skin: Secondary | ICD-10-CM | POA: Insufficient documentation

## 2023-03-20 DIAGNOSIS — I110 Hypertensive heart disease with heart failure: Secondary | ICD-10-CM | POA: Insufficient documentation

## 2023-03-21 NOTE — Progress Notes (Signed)
GALAN, MANNOR ([id number]) [id number] Page 1 of 1 Visit Report for 03/20/2023 SuperBill Details Patient Name: Date of Service: [place] 03/20/2023 Medical Record Number: [id number] Patient Account Number: [id number] Date of Birth/Sex: Treating RN: Dec 18, 1931 (89 y.o. Marlan Palau Primary Care Provider: Alcide Clever Other Clinician: Referring Provider: Treating Provider/Extender: Lenda Kelp [facility], MICHELLE Weeks in Treatment: 9 Diagnosis Coding ICD-10 Codes Code Description [phone] Non-pressure chronic ulcer of other part of right lower leg with fat layer exposed I87.2 Venous insufficiency (chronic) (peripheral) I50.9 Heart failure, unspecified R60.0 Localized edema I25.10 Atherosclerotic heart disease of native coronary artery without angina pectoris L84 Corns and callosities Facility Procedures CPT4 Description Modifier Quantity Code [phone] [id number] BILATERAL: Application of multi-layer venous compression system; leg (below knee), including ankle and 1 foot. ICD-10 Diagnosis Description 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ed Electronic Signature(s) Signed: 03/20/2023 7:55:21 PM By: Allen Derry PA-C Signed: 03/21/2023 11:59:38 AM By: Samuella Bruin Entered By: Samuella Bruin on 03/20/2023 13:00:10

## 2023-03-21 NOTE — Progress Notes (Signed)
RICK, WILTSE ([id number]) [id number] Page 1 of 5 Visit Report for 03/20/2023 Arrival Information Details Patient Name: Date of Service: [place] 03/20/2023 12:30 PM Medical Record Number: [id number] Patient Account Number: [id number] Date of Birth/Sex: Treating RN: [birth year] (89 y.o. Marlan Palau Primary Care : Alcide Clever Other Clinician: Referring : Treating /Extender: Kathleene Hazel, MICHELLE Weeks in Treatment: 9 Visit Information History Since Last Visit Added or deleted any medications: No Patient Arrived: Walker Any new allergies or adverse reactions: No Arrival Time: 12:56 Had a fall or experienced change in No Accompanied By: self activities of daily living that may affect Transfer Assistance: None risk of falls: Patient Identification Verified: Yes Signs or symptoms of abuse/neglect since last visito No Secondary Verification Process Completed: Yes Hospitalized since last visit: No Patient Requires Transmission-Based Precautions: No Implantable device outside of the clinic excluding No Patient Has Alerts: No cellular tissue based products placed in the center since last visit: Has Dressing in Place as Prescribed: Yes Has Compression in Place as Prescribed: Yes Pain Present Now: No Electronic Signature(s) Signed: 03/21/2023 11:59:38 AM By: Samuella Bruin Entered By: Samuella Bruin on 03/20/2023 12:57:02 -------------------------------------------------------------------------------- Compression Therapy Details Patient Name: Date of Service: [name], Genevie Cheshire E. 03/20/2023 12:30 PM Medical Record Number: [id number] Patient Account Number: [id number] Date of Birth/Sex: Treating RN: 09/16/1931 (89 y.o. Marlan Palau Primary Care : Alcide Clever Other Clinician: Referring : Treating /Extender: Kathleene Hazel, MICHELLE Weeks in  Treatment: 9 Compression Therapy Performed for Wound Assessment: Wound #2 Right,Medial Ankle Performed By: Clinician Samuella Bruin, RN Compression Type: Double Layer Electronic Signature(s) Signed: 03/21/2023 11:59:38 AM By: Samuella Bruin Entered By: Samuella Bruin on 03/20/2023 12:57:28 -------------------------------------------------------------------------------- Compression Therapy Details Patient Name: Date of Service: [name], Prakash E. 03/20/2023 12:30 PM Medical Record Number: [id number] Patient Account Number: [id number] HAVOC, GOULETTE ([id number]) [id number] Page 2 of 5 Date of Birth/Sex: Treating RN: 1932/07/25 (89 y.o. Marlan Palau Primary Care : Other Clinician: Alcide Clever Referring : Treating /Extender: Kathleene Hazel, MICHELLE Weeks in Treatment: 9 Compression Therapy Performed for Wound Assessment: Wound #4 Left,Anterior Ankle Performed By: Clinician Samuella Bruin, RN Compression Type: Double Layer Electronic Signature(s) Signed: 03/21/2023 11:59:38 AM By: Gelene Mink By: Samuella Bruin on 03/20/2023 12:59:35 -------------------------------------------------------------------------------- Encounter Discharge Information Details Patient Name: Date of Service: [name], [place]el E. 03/20/2023 12:30 PM Medical Record Number: [id number] Patient Account Number: [id number] Date of Birth/Sex: Treating RN: 02-09-1932 (89 y.o. Marlan Palau Primary Care : Alcide Clever Other Clinician: Referring : Treating /Extender: Kathleene Hazel, MICHELLE Weeks in Treatment: 9 Encounter Discharge Information Items Discharge Condition: Stable Ambulatory Status: Walker Discharge Destination: Home Transportation: Private Auto Accompanied By: self Schedule Follow-up Appointment: Yes Clinical Summary of Care: Patient  Declined Electronic Signature(s) Signed: 03/21/2023 11:59:38 AM By: Gelene Mink By: Samuella Bruin on 03/20/2023 12:59:25 -------------------------------------------------------------------------------- Patient/Caregiver Education Details Patient Name: Date of Service: [name], [name] 8/7/2024andnbsp12:30 PM Medical Record Number: [id number] Patient Account Number: [id number] Date of Birth/Gender: Treating RN: Oct 08, 1931 (89 y.o. Marlan Palau Primary Care Physician: Alcide Clever Other Clinician: Referring Physician: Treating Physician/Extender: Kathleene Hazel, MICHELLE Weeks in Treatment: 9 Education Assessment Education Provided To: Patient Education Topics Provided Wound/Skin Impairment: Methods: Explain/Verbal Responses: Reinforcements needed, State content correctly Electronic Signature(s) VAHIN, DISCHER E ([id number]) [phone].pdf Page 3 of 5 Signed: 03/21/2023 11:59:38 AM By: Samuella Bruin Entered By: Samuella Bruin on 03/20/2023 12:59:15 -------------------------------------------------------------------------------- Wound  Assessment Details Patient Name: Date of Service: [place] 03/20/2023 12:30 PM Medical Record Number: [id number] Patient Account Number: [id number] Date of Birth/Sex: Treating RN: Sep 09, 1931 (89 y.o. Marlan Palau Primary Care : Alcide Clever Other Clinician: Referring : Treating /Extender: Lenda Kelp SCHMERGE, MICHELLE Weeks in Treatment: 9 Wound Status Wound Number: 2 Primary Venous Leg Ulcer Etiology: Wound Location: Right, Medial Ankle Wound Open Wounding Event: Gradually Appeared Status: Date Acquired: 02/12/2023 Comorbid Cataracts, Congestive Heart Failure, Coronary Artery Disease, Weeks Of Treatment: 4 History: Hypertension, Peripheral Venous Disease, Osteoarthritis, Clustered Wound: No Neuropathy Wound  Measurements Length: (cm) 1.4 Width: (cm) 1.1 Depth: (cm) 0.1 Area: (cm) 1.21 Volume: (cm) 0.121 % Reduction in Area: 84.7% % Reduction in Volume: 84.7% Epithelialization: Small (1-33%) Wound Description Classification: Full Thickness Without Exposed Suppor Wound Margin: Flat and Intact Exudate Amount: Medium Exudate Type: Serosanguineous Exudate Color: red, brown t Structures Foul Odor After Cleansing: No Slough/Fibrino Yes Wound Bed Granulation Amount: Medium (34-66%) Exposed Structure Granulation Quality: Red Fascia Exposed: No Necrotic Amount: Medium (34-66%) Fat Layer (Subcutaneous Tissue) Exposed: Yes Necrotic Quality: Adherent Slough Tendon Exposed: No Muscle Exposed: No Joint Exposed: No Bone Exposed: No Periwound Skin Texture Texture Color No Abnormalities Noted: Yes No Abnormalities Noted: No Hemosiderin Staining: Yes Moisture No Abnormalities Noted: No Temperature / Pain Dry / Scaly: Yes Temperature: No Abnormality Treatment Notes Wound #2 (Ankle) Wound Laterality: Right, Medial Cleanser Soap and Water Discharge Instruction: May shower and wash wound with dial antibacterial soap and water prior to dressing change. Wound Cleanser Discharge Instruction: Cleanse the wound with wound cleanser prior to applying a clean dressing using gauze sponges, not tissue or cotton balls. Peri-Wound Care Zinc Oxide Ointment 30g tube Discharge Instruction: Apply Zinc Oxide to periwound with each dressing change ARTEE, BIDDICK ([id number]) [id number] Page 4 of 5 Sween Lotion (Moisturizing lotion) Discharge Instruction: Apply moisturizing lotion as directed Topical Primary Dressing Maxorb Extra Ag+ Alginate Dressing, 2x2 (in/in) Discharge Instruction: Apply to wound bed as instructed Secondary Dressing Woven Gauze Sponge, Non-Sterile 4x4 in Discharge Instruction: Apply over primary dressing as directed. Secured With Compression Wrap Urgo  K2 Lite, (equivalent to a 3 layer) two layer compression system, regular Discharge Instruction: Apply Urgo K2 Lite as directed (alternative to 3 layer compression). Compression Stockings Jobst Farrow Wrap 4000 Quantity: 1 Right Leg Compression Amount: 30-40 mmHg Discharge Instruction: Apply Renee Pain daily as instructed. Apply first thing in the morning, remove at night before bed. Add-Ons Notes same treatment as wound 2 applied to wound 4 per Dr. Lady Gary Electronic Signature(s) Signed: 03/21/2023 11:59:38 AM By: Gelene Mink By: Samuella Bruin on 03/20/2023 12:57:16 -------------------------------------------------------------------------------- Wound Assessment Details Patient Name: Date of Service: [name], JARELL COLAROSSI 03/20/2023 12:30 PM Medical Record Number: [id number] Patient Account Number: [id number] Date of Birth/Sex: Treating RN: 29-Mar-1932 (89 y.o. Marlan Palau Primary Care : Alcide Clever Other Clinician: Referring : Treating /Extender: Lenda Kelp SCHMERGE, MICHELLE Weeks in Treatment: 9 Wound Status Wound Number: 4 Primary Venous Leg Ulcer Etiology: Wound Location: Left, Anterior Ankle Wound Open Wounding Event: Blister Status: Date Acquired: 03/20/2023 Comorbid Cataracts, Congestive Heart Failure, Coronary Artery Disease, Weeks Of Treatment: 0 History: Hypertension, Peripheral Venous Disease, Osteoarthritis, Clustered Wound: No Neuropathy Wound Measurements Length: (cm) 3 Width: (cm) 5 Depth: (cm) 0.1 Area: (cm) 11.781 Volume: (cm) 1.178 % Reduction in Area: % Reduction in Volume: Epithelialization: Small (1-33%) Tunneling: No Undermining: No Wound Description Classification: Full Thickness Without Exposed Suppor Wound Margin: Distinct,  outline attached Exudate Amount: Medium Exudate Type: Serous Exudate Color: amber t Structures Foul Odor After Cleansing: No Slough/Fibrino No Wound  Bed Granulation Amount: Large (67-100%) Exposed HUSSAN, BOYDEN ([id number]) [id number] Page 5 of 5 Granulation Quality: Red Fascia Exposed: No Necrotic Amount: None Present (0%) Fat Layer (Subcutaneous Tissue) Exposed: No Tendon Exposed: No Muscle Exposed: No Joint Exposed: No Bone Exposed: No Limited to Skin Breakdown Periwound Skin Texture Texture Color No Abnormalities Noted: Yes No Abnormalities Noted: No Hemosiderin Staining: Yes Moisture No Abnormalities Noted: Yes Temperature / Pain Temperature: No Abnormality Treatment Notes Wound #4 (Ankle) Wound Laterality: Left, Anterior Cleanser Soap and Water Discharge Instruction: May shower and wash wound with dial antibacterial soap and water prior to dressing change. Wound Cleanser Discharge Instruction: Cleanse the wound with wound cleanser prior to applying a clean dressing using gauze sponges, not tissue or cotton balls. Peri-Wound Care Zinc Oxide Ointment 30g tube Discharge Instruction: Apply Zinc Oxide to periwound with each dressing change Sween Lotion (Moisturizing lotion) Discharge Instruction: Apply moisturizing lotion as directed Topical Primary Dressing Maxorb Extra Ag+ Alginate Dressing, 2x2 (in/in) Discharge Instruction: Apply to wound bed as instructed Secondary Dressing Woven Gauze Sponge, Non-Sterile 4x4 in Discharge Instruction: Apply over primary dressing as directed. Secured With Compression Wrap Urgo K2 Lite, (equivalent to a 3 layer) two layer compression system, regular Discharge Instruction: Apply Urgo K2 Lite as directed (alternative to 3 layer compression). Compression Stockings Add-Ons Notes same treatment as wound 2 applied to wound 4 per Dr. Lady Gary Electronic Signature(s) Signed: 03/21/2023 11:59:38 AM By: Samuella Bruin Entered By: Samuella Bruin on 03/20/2023 12:58:27

## 2023-03-27 ENCOUNTER — Encounter (HOSPITAL_BASED_OUTPATIENT_CLINIC_OR_DEPARTMENT_OTHER): Payer: Medicare Other | Admitting: General Surgery

## 2023-03-27 DIAGNOSIS — L97812 Non-pressure chronic ulcer of other part of right lower leg with fat layer exposed: Secondary | ICD-10-CM | POA: Diagnosis not present

## 2023-03-28 NOTE — Progress Notes (Signed)
PAXON, CAVITT ([id number]) [id number] Page 1 of 11 Visit Report for 03/27/2023 Arrival Information Details Patient Name: Date of Service: [place] 03/27/2023 2:15 PM Medical Record Number: [id number] Patient Account Number: [id number] Date of Birth/Sex: Treating RN: 02-Oct-1931 (89 y.o. Marlan Palau Primary Care : Alcide Clever Other Clinician: Referring : Treating /Extender: Duanne Guess [facility], MICHELLE Weeks in Treatment: 10 Visit Information History Since Last Visit Added or deleted any medications: No Patient Arrived: Walker Any new allergies or adverse reactions: No Arrival Time: 14:21 Had a fall or experienced change in No Accompanied By: self activities of daily living that may affect Transfer Assistance: None risk of falls: Patient Identification Verified: Yes Signs or symptoms of abuse/neglect since last visito No Secondary Verification Process Completed: Yes Hospitalized since last visit: No Patient Requires Transmission-Based Precautions: No Implantable device outside of the clinic excluding No Patient Has Alerts: No cellular tissue based products placed in the center since last visit: Has Dressing in Place as Prescribed: Yes Has Compression in Place as Prescribed: Yes Pain Present Now: No Electronic Signature(s) Signed: 03/27/2023 4:38:35 PM By: Samuella Bruin Entered By: Samuella Bruin on 03/27/2023 14:22:05 -------------------------------------------------------------------------------- Compression Therapy Details Patient Name: Date of Service: [name], Balen E. 03/27/2023 2:15 PM Medical Record Number: [id number] Patient Account Number: [id number] Date of Birth/Sex: Treating RN: 08/31/1931 (89 y.o. Marlan Palau Primary Care : Alcide Clever Other Clinician: Referring : Treating /Extender: Duanne Guess [facility], MICHELLE Weeks in  Treatment: 10 Compression Therapy Performed for Wound Assessment: Wound #2 Right,Medial Ankle Performed By: Clinician Samuella Bruin, RN Compression Type: Double Layer Post Procedure Diagnosis Same as Pre-procedure Electronic Signature(s) Signed: 03/27/2023 4:38:35 PM By: Samuella Bruin Entered By: Samuella Bruin on 03/27/2023 14:42:53 [name] ([id number]) [id number] Page 2 of 11 -------------------------------------------------------------------------------- Compression Therapy Details Patient Name: Date of Service: [place] 03/27/2023 2:15 PM Medical Record Number: [id number] Patient Account Number: [id number] Date of Birth/Sex: Treating RN: 03-15-1932 (89 y.o. Marlan Palau Primary Care : Alcide Clever Other Clinician: Referring : Treating /Extender: Duanne Guess [facility], MICHELLE Weeks in Treatment: 10 Compression Therapy Performed for Wound Assessment: Wound #4 Left,Anterior Ankle Performed By: Clinician Samuella Bruin, RN Compression Type: Double Layer Post Procedure Diagnosis Same as Pre-procedure Electronic Signature(s) Signed: 03/27/2023 4:38:35 PM By: Gelene Mink By: Samuella Bruin on 03/27/2023 14:42:53 -------------------------------------------------------------------------------- Encounter Discharge Information Details Patient Name: Date of Service: [name], Lief E. 03/27/2023 2:15 PM Medical Record Number: [id number] Patient Account Number: [id number] Date of Birth/Sex: Treating RN: 1932/04/03 (89 y.o. Marlan Palau Primary Care : Alcide Clever Other Clinician: Referring : Treating /Extender: Duanne Guess [facility], MICHELLE Weeks in Treatment: 10 Encounter Discharge Information Items Discharge Condition: Stable Ambulatory Status: Walker Discharge Destination: Home Transportation: Private  Auto Accompanied By: self Schedule Follow-up Appointment: Yes Clinical Summary of Care: Patient Declined Electronic Signature(s) Signed: 03/27/2023 4:38:35 PM By: Samuella Bruin Entered By: Samuella Bruin on 03/27/2023 15:27:32 -------------------------------------------------------------------------------- Lower Extremity Assessment Details Patient Name: Date of Service: [name], Aldric E. 03/27/2023 2:15 PM Medical Record Number: [id number] Patient Account Number: [id number] Date of Birth/Sex: Treating RN: January 30, 1932 (89 y.o. Marlan Palau Primary Care : Alcide Clever Other Clinician: Referring : Treating /Extender: Duanne Guess [facility], MICHELLE Weeks in Treatment: 10 Edema Assessment Assessed: [Left: No] [Right: No] Edema: [Left: Yes] [Right: Yes] Calf RYDIN, BRADEN E ([id number]) [phone].pdf Page 3 of 11 Left: Right: Point of Measurement: From Medial Instep 34.5 cm 33 cm Ankle Left: Right:  Point of Measurement: From Medial Instep 23.8 cm 24 cm Vascular Assessment Pulses: Dorsalis Pedis Palpable: [Left:No] [Right:Yes] Extremity colors, hair growth, and conditions: Extremity Color: [Left:Hyperpigmented] [Right:Hyperpigmented] Hair Growth on Extremity: [Left:No] [Right:No] Temperature of Extremity: [Left:Warm] [Right:Warm] Capillary Refill: [Left:< 3 seconds] [Right:< 3 seconds] Dependent Rubor: [Left:No No] [Right:No No] Electronic Signature(s) Signed: 03/27/2023 4:38:35 PM By: Samuella Bruin Entered By: Samuella Bruin on 03/27/2023 14:28:02 -------------------------------------------------------------------------------- Multi Wound Chart Details Patient Name: Date of Service: [name], Dimitry E. 03/27/2023 2:15 PM Medical Record Number: [id number] Patient Account Number: [id number] Date of Birth/Sex: Treating RN: Nov 05, 1931 (89 y.o. M) Primary Care : Alcide Clever Other  Clinician: Referring : Treating /Extender: Duanne Guess [facility], MICHELLE Weeks in Treatment: 10 Vital Signs Height(in): 72 Pulse(bpm): 94 Weight(lbs): 198 Blood Pressure(mmHg): 117/56 Body Mass Index(BMI): 26.9 Temperature(F): 98.3 Respiratory Rate(breaths/min): 18 [2:Photos:] Right, Medial Ankle Left, Anterior Ankle Left, Anterior Lower Leg Wound Location: Gradually Appeared Blister Gradually Appeared Wounding Event: Venous Leg Ulcer Venous Leg Ulcer Venous Leg Ulcer Primary Etiology: Cataracts, Congestive Heart Failure, Cataracts, Congestive Heart Failure, Cataracts, Congestive Heart Failure, Comorbid History: Coronary Artery Disease, Coronary Artery Disease, Coronary Artery Disease, Hypertension, Peripheral Venous Hypertension, Peripheral Venous Hypertension, Peripheral Venous Disease, Osteoarthritis, Neuropathy Disease, Osteoarthritis, Neuropathy Disease, Osteoarthritis, Neuropathy 02/12/2023 03/20/2023 03/27/2023 Date Acquired: 5 1 0 Weeks of Treatment: Open Open Open Wound Status: No No No Wound Recurrence: 0.5x0.5x0.1 1.5x0.6x0.1 0.3x0.3x0.1 Measurements L x W x D (cm) 0.196 0.707 0.071 A (cm) : rea 0.02 0.071 0.007 Volume (cm) : 97.50% 94.00% N/A % Reduction in Area: 97.50% 94.00% N/A % Reduction in VolumePASQUALE, MILLAY E ([id number]) [id number] Page 4 of 11 Full Thickness Without Exposed Full Thickness Without Exposed Full Thickness Without Exposed Classification: Support Structures Support Structures Support Structures Medium Medium Medium Exudate A mount: Serosanguineous Serous Serosanguineous Exudate Type: red, brown amber red, brown Exudate Color: Flat and Intact Distinct, outline attached Distinct, outline attached Wound Margin: Large (67-100%) Large (67-100%) Large (67-100%) Granulation Amount: Red Red Red Granulation Quality: Small (1-33%) Small (1-33%) None Present (0%) Necrotic Amount: Eschar,  Adherent Slough Adherent Slough N/A Necrotic Tissue: Fat Layer (Subcutaneous Tissue): Yes Fat Layer (Subcutaneous Tissue): Yes Fat Layer (Subcutaneous Tissue): Yes Exposed Structures: Fascia: No Fascia: No Fascia: No Tendon: No Tendon: No Tendon: No Muscle: No Muscle: No Muscle: No Joint: No Joint: No Joint: No Bone: No Bone: No Bone: No Medium (34-66%) Medium (34-66%) None Epithelialization: No Abnormalities Noted No Abnormalities Noted No Abnormalities Noted Periwound Skin Texture: Dry/Scaly: Yes No Abnormalities Noted No Abnormalities Noted Periwound Skin Moisture: Hemosiderin Staining: Yes Hemosiderin Staining: Yes Hemosiderin Staining: Yes Periwound Skin Color: No Abnormality No Abnormality No Abnormality Temperature: Compression Therapy Compression Therapy N/A Procedures Performed: Treatment Notes Electronic Signature(s) Signed: 03/27/2023 2:53:12 PM By: Duanne Guess MD FACS Entered By: Duanne Guess on 03/27/2023 14:53:11 -------------------------------------------------------------------------------- Multi-Disciplinary Care Plan Details Patient Name: Date of Service: [name], Emeril E. 03/27/2023 2:15 PM Medical Record Number: [id number] Patient Account Number: [id number] Date of Birth/Sex: Treating RN: 10-16-1931 (89 y.o. Marlan Palau Primary Care : Alcide Clever Other Clinician: Referring : Treating /Extender: Duanne Guess [facility], MICHELLE Weeks in Treatment: 10 Multidisciplinary Care Plan reviewed with physician Active Inactive Venous Leg Ulcer Nursing Diagnoses: Knowledge deficit related to disease process and management Potential for venous Insuffiency (use before diagnosis confirmed) Goals: Patient will maintain optimal edema control Date Initiated: 01/11/2023 Target Resolution Date: 05/10/2023 Goal Status: Active Interventions: Assess peripheral edema status every visit. Compression as  ordered Provide education on  venous insufficiency Treatment Activities: Therapeutic compression applied : 01/11/2023 Notes: Wound/Skin Impairment Nursing Diagnoses: Impaired tissue integrity Knowledge deficit related to ulceration/compromised skin integrity HEBERT, HARCLERODE E ([id number]) [phone].pdf Page 5 of 11 Goals: Patient/caregiver will verbalize understanding of skin care regimen Date Initiated: 01/11/2023 Target Resolution Date: 05/10/2023 Goal Status: Active Ulcer/skin breakdown will have a volume reduction of 30% by week 4 Date Initiated: 01/11/2023 Date Inactivated: 02/07/2023 Target Resolution Date: 02/08/2023 Goal Status: Met Interventions: Assess patient/caregiver ability to obtain necessary supplies Assess patient/caregiver ability to perform ulcer/skin care regimen upon admission and as needed Assess ulceration(s) every visit Provide education on ulcer and skin care Treatment Activities: Skin care regimen initiated : 01/11/2023 Topical wound management initiated : 01/11/2023 Notes: Electronic Signature(s) Signed: 03/27/2023 4:38:35 PM By: Samuella Bruin Entered By: Samuella Bruin on 03/27/2023 15:24:35 -------------------------------------------------------------------------------- Pain Assessment Details Patient Name: Date of Service: [name], Genevie Cheshire E. 03/27/2023 2:15 PM Medical Record Number: [id number] Patient Account Number: [id number] Date of Birth/Sex: Treating RN: 1932-07-10 (89 y.o. Marlan Palau Primary Care : Alcide Clever Other Clinician: Referring : Treating /Extender: Duanne Guess [facility], MICHELLE Weeks in Treatment: 10 Active Problems Location of Pain Severity and Description of Pain Patient Has Paino No Site Locations Rate the pain. Current Pain Level: 0 Pain Management and Medication Current Pain Management: Electronic Signature(s) Signed: 03/27/2023 4:38:35 PM By:  Samuella Bruin Entered By: Samuella Bruin on 03/27/2023 14:23:40 Mariea Clonts E ([id number]) [id number] Page 6 of 11 -------------------------------------------------------------------------------- Patient/Caregiver Education Details Patient Name: Date of Service: [place] 8/14/2024andnbsp2:15 PM Medical Record Number: [id number] Patient Account Number: [id number] Date of Birth/Gender: Treating RN: [birth year] (89 y.o. Marlan Palau Primary Care Physician: Alcide Clever Other Clinician: Referring Physician: Treating Physician/Extender: Duanne Guess [facility], MICHELLE Weeks in Treatment: 10 Education Assessment Education Provided To: Patient Education Topics Provided Wound/Skin Impairment: Methods: Explain/Verbal Responses: Reinforcements needed, State content correctly Electronic Signature(s) Signed: 03/27/2023 4:38:35 PM By: Samuella Bruin Entered By: Samuella Bruin on 03/27/2023 15:26:09 -------------------------------------------------------------------------------- Wound Assessment Details Patient Name: Date of Service: [name], Devian E. 03/27/2023 2:15 PM Medical Record Number: [id number] Patient Account Number: [id number] Date of Birth/Sex: Treating RN: 1931/10/10 (89 y.o. Marlan Palau Primary Care : Alcide Clever Other Clinician: Referring : Treating /Extender: Duanne Guess [facility], MICHELLE Weeks in Treatment: 10 Wound Status Wound Number: 2 Primary Venous Leg Ulcer Etiology: Wound Location: Right, Medial Ankle Wound Open Wounding Event: Gradually Appeared Status: Date Acquired: 02/12/2023 Comorbid Cataracts, Congestive Heart Failure, Coronary Artery Disease, Weeks Of Treatment: 5 History: Hypertension, Peripheral Venous Disease, Osteoarthritis, Clustered Wound: No Neuropathy Photos Wound Measurements Length: (cm) 0.5 Width: (cm) 0.5 Mencer, Harrell E  ([id number]) Depth: (cm) 0.1 Area: (cm) 0.196 Volume: (cm) 0.02 % Reduction in Area: 97.5% % Reduction in Volume: 97.5% [id number] Page 7 of 11 Epithelialization: Medium (34-66%) Tunneling: No Undermining: No Wound Description Classification: Full Thickness Without Exposed Support Structures Wound Margin: Flat and Intact Exudate Amount: Medium Exudate Type: Serosanguineous Exudate Color: red, brown Foul Odor After Cleansing: No Slough/Fibrino Yes Wound Bed Granulation Amount: Large (67-100%) Exposed Structure Granulation Quality: Red Fascia Exposed: No Necrotic Amount: Small (1-33%) Fat Layer (Subcutaneous Tissue) Exposed: Yes Necrotic Quality: Eschar, Adherent Slough Tendon Exposed: No Muscle Exposed: No Joint Exposed: No Bone Exposed: No Periwound Skin Texture Texture Color No Abnormalities Noted: Yes No Abnormalities Noted: No Hemosiderin Staining: Yes Moisture No Abnormalities Noted: No Temperature / Pain Dry / Scaly: Yes Temperature: No Abnormality Treatment Notes Wound #2 (Ankle) Wound Laterality: Right, Medial Cleanser  Soap and Water Discharge Instruction: May shower and wash wound with dial antibacterial soap and water prior to dressing change. Wound Cleanser Discharge Instruction: Cleanse the wound with wound cleanser prior to applying a clean dressing using gauze sponges, not tissue or cotton balls. Peri-Wound Care Zinc Oxide Ointment 30g tube Discharge Instruction: Apply Zinc Oxide to periwound with each dressing change Sween Lotion (Moisturizing lotion) Discharge Instruction: Apply moisturizing lotion as directed Topical Primary Dressing Maxorb Extra Ag+ Alginate Dressing, 2x2 (in/in) Discharge Instruction: Apply to wound bed as instructed Secondary Dressing Woven Gauze Sponge, Non-Sterile 4x4 in Discharge Instruction: Apply over primary dressing as directed. Secured With Compression Wrap Urgo K2 Lite, (equivalent to a 3  layer) two layer compression system, regular Discharge Instruction: Apply Urgo K2 Lite as directed (alternative to 3 layer compression). Compression Stockings Jobst Farrow Wrap 4000 Quantity: 1 Right Leg Compression Amount: 30-40 mmHg Discharge Instruction: Apply Renee Pain daily as instructed. Apply first thing in the morning, remove at night before bed. Add-Ons Electronic Signature(s) Signed: 03/27/2023 4:38:35 PM By: Samuella Bruin Entered By: Samuella Bruin on 03/27/2023 14:35:38 [name] ([id number]) [id number] Page 8 of 11 -------------------------------------------------------------------------------- Wound Assessment Details Patient Name: Date of Service: [place] 03/27/2023 2:15 PM Medical Record Number: [id number] Patient Account Number: [id number] Date of Birth/Sex: Treating RN: November 15, 1931 (89 y.o. Marlan Palau Primary Care : Alcide Clever Other Clinician: Referring : Treating /Extender: Duanne Guess [facility], MICHELLE Weeks in Treatment: 10 Wound Status Wound Number: 4 Primary Venous Leg Ulcer Etiology: Wound Location: Left, Anterior Ankle Wound Open Wounding Event: Blister Status: Date Acquired: 03/20/2023 Comorbid Cataracts, Congestive Heart Failure, Coronary Artery Disease, Weeks Of Treatment: 1 History: Hypertension, Peripheral Venous Disease, Osteoarthritis, Clustered Wound: No Neuropathy Photos Wound Measurements Length: (cm) 1 Width: (cm) 0 Depth: (cm) 0 Area: (cm) Volume: (cm) .5 % Reduction in Area: 94% .6 % Reduction in Volume: 94% .1 Epithelialization: Medium (34-66%) 0.707 Tunneling: No 0.071 Undermining: No Wound Description Classification: Full Thickness Without Exposed Suppor Wound Margin: Distinct, outline attached Exudate Amount: Medium Exudate Type: Serous Exudate Color: amber t Structures Foul Odor After Cleansing: No Slough/Fibrino Yes Wound  Bed Granulation Amount: Large (67-100%) Exposed Structure Granulation Quality: Red Fascia Exposed: No Necrotic Amount: Small (1-33%) Fat Layer (Subcutaneous Tissue) Exposed: Yes Necrotic Quality: Adherent Slough Tendon Exposed: No Muscle Exposed: No Joint Exposed: No Bone Exposed: No Periwound Skin Texture Texture Color No Abnormalities Noted: Yes No Abnormalities Noted: No Hemosiderin Staining: Yes Moisture No Abnormalities Noted: Yes Temperature / Pain Temperature: No Abnormality Treatment Notes Wound #4 (Ankle) Wound Laterality: Left, Anterior Cleanser Soap and Water SHUFORD, BYWATERS E ([id number]) [id number] Page 9 of 11 Discharge Instruction: May shower and wash wound with dial antibacterial soap and water prior to dressing change. Wound Cleanser Discharge Instruction: Cleanse the wound with wound cleanser prior to applying a clean dressing using gauze sponges, not tissue or cotton balls. Peri-Wound Care Zinc Oxide Ointment 30g tube Discharge Instruction: Apply Zinc Oxide to periwound with each dressing change Sween Lotion (Moisturizing lotion) Discharge Instruction: Apply moisturizing lotion as directed Topical Primary Dressing Maxorb Extra Ag+ Alginate Dressing, 2x2 (in/in) Discharge Instruction: Apply to wound bed as instructed Secondary Dressing Woven Gauze Sponge, Non-Sterile 4x4 in Discharge Instruction: Apply over primary dressing as directed. Secured With Compression Wrap Urgo K2 Lite, (equivalent to a 3 layer) two layer compression system, regular Discharge Instruction: Apply Urgo K2 Lite as directed (alternative to 3 layer compression). Compression Stockings Add-Ons Electronic Signature(s) Signed: 03/27/2023 4:38:35 PM  By: Gelene Mink By: Samuella Bruin on 03/27/2023 14:36:09 -------------------------------------------------------------------------------- Wound Assessment Details Patient Name: Date of  Service: [name], Asael E. 03/27/2023 2:15 PM Medical Record Number: [id number] Patient Account Number: [id number] Date of Birth/Sex: Treating RN: 15-Oct-1931 (89 y.o. Marlan Palau Primary Care : Alcide Clever Other Clinician: Referring : Treating /Extender: Duanne Guess [facility], MICHELLE Weeks in Treatment: 10 Wound Status Wound Number: 5 Primary Venous Leg Ulcer Etiology: Wound Location: Left, Anterior Lower Leg Wound Open Wounding Event: Gradually Appeared Status: Date Acquired: 03/27/2023 Comorbid Cataracts, Congestive Heart Failure, Coronary Artery Disease, Weeks Of Treatment: 0 History: Hypertension, Peripheral Venous Disease, Osteoarthritis, Clustered Wound: No Neuropathy Photos Wound Measurements JAIR, MISKE E ([id number]) Length: (cm) 0.3 Width: (cm) 0.3 Depth: (cm) 0.1 Area: (cm) 0.071 Volume: (cm) 0.007 [id number] Page 10 of 11 % Reduction in Area: % Reduction in Volume: Epithelialization: None Tunneling: No Undermining: No Wound Description Classification: Full Thickness Without Exposed Support Structures Wound Margin: Distinct, outline attached Exudate Amount: Medium Exudate Type: Serosanguineous Exudate Color: red, brown Foul Odor After Cleansing: No Slough/Fibrino No Wound Bed Granulation Amount: Large (67-100%) Exposed Structure Granulation Quality: Red Fascia Exposed: No Necrotic Amount: None Present (0%) Fat Layer (Subcutaneous Tissue) Exposed: Yes Tendon Exposed: No Muscle Exposed: No Joint Exposed: No Bone Exposed: No Periwound Skin Texture Texture Color No Abnormalities Noted: Yes No Abnormalities Noted: No Hemosiderin Staining: Yes Moisture No Abnormalities Noted: Yes Temperature / Pain Temperature: No Abnormality Treatment Notes Wound #5 (Lower Leg) Wound Laterality: Left, Anterior Cleanser Soap and Water Discharge Instruction: May shower and wash wound with  dial antibacterial soap and water prior to dressing change. Wound Cleanser Discharge Instruction: Cleanse the wound with wound cleanser prior to applying a clean dressing using gauze sponges, not tissue or cotton balls. Peri-Wound Care Zinc Oxide Ointment 30g tube Discharge Instruction: Apply Zinc Oxide to periwound with each dressing change Sween Lotion (Moisturizing lotion) Discharge Instruction: Apply moisturizing lotion as directed Topical Primary Dressing Maxorb Extra Ag+ Alginate Dressing, 2x2 (in/in) Discharge Instruction: Apply to wound bed as instructed Secondary Dressing Woven Gauze Sponge, Non-Sterile 4x4 in Discharge Instruction: Apply over primary dressing as directed. Secured With Compression Wrap Urgo K2 Lite, (equivalent to a 3 layer) two layer compression system, regular Discharge Instruction: Apply Urgo K2 Lite as directed (alternative to 3 layer compression). Compression Stockings Add-Ons Electronic Signature(s) Signed: 03/27/2023 4:38:35 PM By: Samuella Bruin Entered By: Samuella Bruin on 03/27/2023 14:36:38 [name] ([id number]) [id number] Page 11 of 11 -------------------------------------------------------------------------------- Vitals Details Patient Name: Date of Service: [place] 03/27/2023 2:15 PM Medical Record Number: [id number] Patient Account Number: [id number] Date of Birth/Sex: Treating RN: November 27, 1931 (89 y.o. Marlan Palau Primary Care : Alcide Clever Other Clinician: Referring : Treating /Extender: Duanne Guess [facility], MICHELLE Weeks in Treatment: 10 Vital Signs Time Taken: 14:22 Temperature (F): 98.3 Height (in): 72 Pulse (bpm): 94 Weight (lbs): 198 Respiratory Rate (breaths/min): 18 Body Mass Index (BMI): 26.9 Blood Pressure (mmHg): 117/56 Reference Range: 80 - 120 mg / dl Electronic Signature(s) Signed: 03/27/2023 4:38:35 PM By: Samuella Bruin Entered By: Samuella Bruin on 03/27/2023 14:23:30

## 2023-03-28 NOTE — Progress Notes (Signed)
[name], [name] ([id number]) [id number] Page 1 of 10 Visit Report for 03/27/2023 Chief Complaint Document Details Patient Name: Date of Service: [place] 03/27/2023 2:15 PM Medical Record Number: [id number] Patient Account Number: [id number] Date of Birth/Sex: Treating RN: 1931-10-20 (89 y.o. M) Primary Care Provider: Alcide Clever Other Clinician: Referring Provider: Treating Provider/Extender: Duanne Guess [facility], MICHELLE Weeks in Treatment: 10 Information Obtained from: Patient Chief Complaint Patient presents for treatment of an open ulcer due to venous insufficiency Electronic Signature(s) Signed: 03/27/2023 2:53:37 PM By: Duanne Guess MD FACS Entered By: Duanne Guess on 03/27/2023 14:53:37 -------------------------------------------------------------------------------- HPI Details Patient Name: Date of Service: [name], [name] 03/27/2023 2:15 PM Medical Record Number: [id number] Patient Account Number: [id number] Date of Birth/Sex: Treating RN: 1932-02-10 (89 y.o. M) Primary Care Provider: Alcide Clever Other Clinician: Referring Provider: Treating Provider/Extender: Duanne Guess [facility], MICHELLE Weeks in Treatment: 10 History of Present Illness HPI Description: ADMISSION 01/11/2023 This is an 89 year old nondiabetic with a history of chronic venous insufficiency, coronary artery disease, congestive heart failure, and lower extremity edema. He has historically been followed by podiatry for various ulcers and calluses on his feet. When he was seen in the middle of April, he was noted to have an ulcer starting on his left ankle. He was prescribed a course of cephalexin and advised to apply Betadine to the wound with an Ace wrap. He was referred to the wound care center for further evaluation and management. Apparently the wound closed of its own accord prior to his appointment with [place], but subsequently reopened.  The notes from podiatry do indicate that compression and elevation were discussed as means to help heal ulceration and avoid future episodes. After the wound reopened, he was seen again and prescribed a course of Augmentin. He is now here for his wound care appointment. 01/21/2023: The wound is smaller and more superficial. It is fairly clean with just a little bit of light slough on the surface. His venous reflux studies are scheduled for this coming Thursday. 01/29/2023: The wound is smaller again today and very superficial. There is minimal slough present with a little eschar around the edges. His venous reflux studies were completed, and as expected, he has severe venous reflux. 02/07/2023: His wound is very nearly closed. He has an appointment coming up with vascular surgery on July 8. He indicated a callus on the bottom of his left foot that is bothering him today. 02/15/2023: There is just a pinhole opening remaining on the left, but unfortunately, he has had a new ulceration occur on the medial aspect of his right lower leg. The fat layer is exposed. He does have 2+ pitting edema in this leg. He says he noted fluid coming from the site a couple of days ago. He does not wear compression garments due to discomfort with stockings, but he has never received juxta lite or Farrow wrap garments. 02/22/2023: His left leg has healed. On the right, the open area has contracted considerably. There is some slough and eschar present. He did meet with vascular surgery for his initial consultation and will return in 3 months to discuss saphenous vein ablation. 03/04/2023: His wraps slid down bilaterally and he is leaking clear fluid from the left leg. There is slough build up on the right lower leg wound, but the overall size is smaller. 03/11/2023: The area on his left leg where he was leaking fluid has closed. The right lower leg wound is smaller and clean. He does have  his Fabian November wraps with him today. MATTHAIS, MIDDENDORF ([id number]) [id number] Page 2 of 10 03/27/2023: At his nurse visit last week, his left medial ankle was found to be open. He has another new wound today on the left anterior tibial surface. The right ankle ulcer is smaller today. Everything is very superficial and clean. Electronic Signature(s) Signed: 03/27/2023 2:54:38 PM By: Duanne Guess MD FACS Entered By: Duanne Guess on 03/27/2023 14:54:38 -------------------------------------------------------------------------------- Physical Exam Details Patient Name: Date of Service: [name], Jordan E. 03/27/2023 2:15 PM Medical Record Number: [id number] Patient Account Number: [id number] Date of Birth/Sex: Treating RN: 1932-06-12 (89 y.o. M) Primary Care Provider: Alcide Clever Other Clinician: Referring Provider: Treating Provider/Extender: Duanne Guess [facility], MICHELLE Weeks in Treatment: 10 Constitutional . . . . no acute distress. Respiratory Normal work of breathing on room air. Notes 03/27/2023: At his nurse visit last week, his left medial ankle was found to be open. He has another new wound today on the left anterior tibial surface. The right ankle ulcer is smaller today. Everything is very superficial and clean. Electronic Signature(s) Signed: 03/27/2023 2:55:39 PM By: Duanne Guess MD FACS Entered By: Duanne Guess on 03/27/2023 14:55:38 -------------------------------------------------------------------------------- Physician Orders Details Patient Name: Date of Service: [place] Payor [name], Jordan E. 03/27/2023 2:15 PM Medical Record Number: [id number] Patient Account Number: [id number] Date of Birth/Sex: Treating RN: 24-May-1932 (89 y.o. Marlan Palau Primary Care Provider: Alcide Clever Other Clinician: Referring Provider: Treating Provider/Extender: Duanne Guess [facility], MICHELLE Weeks in Treatment: 10 Verbal / Phone Orders: No Diagnosis Coding ICD-10  Coding Code Description [phone] Non-pressure chronic ulcer of other part of right lower leg with fat layer exposed L97.822 Non-pressure chronic ulcer of other part of left lower leg with fat layer exposed L97.321 Non-pressure chronic ulcer of left ankle limited to breakdown of skin I87.2 Venous insufficiency (chronic) (peripheral) I50.9 Heart failure, unspecified R60.0 Localized edema I25.10 Atherosclerotic heart disease of native coronary artery without angina pectoris L84 Corns and callosities Follow-up Appointments ppointment in 1 week. - Dr. Lady Gary - room 2 Return A HABEEB, CHAGOLLA ([id number]) [id number] Page 3 of 10 Anesthetic (In clinic) Topical Lidocaine 4% applied to wound bed Bathing/ Shower/ Hygiene May shower with protection but do not get wound dressing(s) wet. Protect dressing(s) with water repellant cover (for example, large plastic bag) or a cast cover and may then take shower. Edema Control - Lymphedema / SCD / Other Elevate legs to the level of the heart or above for 30 minutes daily and/or when sitting for 3-4 times a day throughout the day. - throughout the day Avoid standing for long periods of time. Exercise regularly Compression stocking or Garment 20-30 mm/Hg pressure to: - left leg daily Wound Treatment Wound #2 - Ankle Wound Laterality: Right, Medial Cleanser: Soap and Water 1 x Per Week/30 Days Discharge Instructions: May shower and wash wound with dial antibacterial soap and water prior to dressing change. Cleanser: Wound Cleanser 1 x Per Week/30 Days Discharge Instructions: Cleanse the wound with wound cleanser prior to applying a clean dressing using gauze sponges, not tissue or cotton balls. Peri-Wound Care: Zinc Oxide Ointment 30g tube 1 x Per Week/30 Days Discharge Instructions: Apply Zinc Oxide to periwound with each dressing change Peri-Wound Care: Sween Lotion (Moisturizing lotion) 1 x Per Week/30 Days Discharge  Instructions: Apply moisturizing lotion as directed Prim Dressing: Maxorb Extra Ag+ Alginate Dressing, 2x2 (in/in) 1 x Per Week/30 Days ary Discharge Instructions: Apply to wound bed as instructed Secondary Dressing: Woven Gauze  Sponge, Non-Sterile 4x4 in 1 x Per Week/30 Days Discharge Instructions: Apply over primary dressing as directed. Compression Wrap: Urgo K2 Lite, (equivalent to a 3 layer) two layer compression system, regular 1 x Per Week/30 Days Discharge Instructions: Apply Urgo K2 Lite as directed (alternative to 3 layer compression). Compression Stockings: Jobst Farrow Wrap 4000 Right Leg Compression Amount: 30-40 mmHG Discharge Instructions: Apply Renee Pain daily as instructed. Apply first thing in the morning, remove at night before bed. Wound #4 - Ankle Wound Laterality: Left, Anterior Cleanser: Soap and Water 1 x Per Week/30 Days Discharge Instructions: May shower and wash wound with dial antibacterial soap and water prior to dressing change. Cleanser: Wound Cleanser 1 x Per Week/30 Days Discharge Instructions: Cleanse the wound with wound cleanser prior to applying a clean dressing using gauze sponges, not tissue or cotton balls. Peri-Wound Care: Zinc Oxide Ointment 30g tube 1 x Per Week/30 Days Discharge Instructions: Apply Zinc Oxide to periwound with each dressing change Peri-Wound Care: Sween Lotion (Moisturizing lotion) 1 x Per Week/30 Days Discharge Instructions: Apply moisturizing lotion as directed Prim Dressing: Maxorb Extra Ag+ Alginate Dressing, 2x2 (in/in) 1 x Per Week/30 Days ary Discharge Instructions: Apply to wound bed as instructed Secondary Dressing: Woven Gauze Sponge, Non-Sterile 4x4 in 1 x Per Week/30 Days Discharge Instructions: Apply over primary dressing as directed. Compression Wrap: Urgo K2 Lite, (equivalent to a 3 layer) two layer compression system, regular 1 x Per Week/30 Days Discharge Instructions: Apply Urgo K2 Lite as directed (alternative  to 3 layer compression). Wound #5 - Lower Leg Wound Laterality: Left, Anterior Cleanser: Soap and Water 1 x Per Week/30 Days Discharge Instructions: May shower and wash wound with dial antibacterial soap and water prior to dressing change. Cleanser: Wound Cleanser 1 x Per Week/30 Days Discharge Instructions: Cleanse the wound with wound cleanser prior to applying a clean dressing using gauze sponges, not tissue or cotton balls. Peri-Wound Care: Zinc Oxide Ointment 30g tube 1 x Per Week/30 Days Discharge Instructions: Apply Zinc Oxide to periwound with each dressing change Peri-Wound Care: Sween Lotion (Moisturizing lotion) 1 x Per Week/30 Days Discharge Instructions: Apply moisturizing lotion as directed Prim Dressing: Maxorb Extra Ag+ Alginate Dressing, 2x2 (in/in) ary 1 x Per Week/30 Days ZAFIR, MISCHEL ([id number]) [phone].pdf Page 4 of 10 Discharge Instructions: Apply to wound bed as instructed Secondary Dressing: Woven Gauze Sponge, Non-Sterile 4x4 in 1 x Per Week/30 Days Discharge Instructions: Apply over primary dressing as directed. Compression Wrap: Urgo K2 Lite, (equivalent to a 3 layer) two layer compression system, regular 1 x Per Week/30 Days Discharge Instructions: Apply Urgo K2 Lite as directed (alternative to 3 layer compression). Patient Medications llergies: amlodipine, Lyrica, gabapentin A Notifications Medication Indication Start End 03/27/2023 lidocaine DOSE topical 4 % cream - cream topical Electronic Signature(s) Signed: 03/27/2023 2:57:57 PM By: Duanne Guess MD FACS Entered By: Duanne Guess on 03/27/2023 14:55:56 -------------------------------------------------------------------------------- Problem List Details Patient Name: Date of Service: Leone Payor [name], Kendrell E. 03/27/2023 2:15 PM Medical Record Number: [id number] Patient Account Number: [id number] Date of Birth/Sex: Treating RN: Sep 06, 1931 (89 y.o. M) Primary Care Provider:  Alcide Clever Other Clinician: Referring Provider: Treating Provider/Extender: Duanne Guess [facility], MICHELLE Weeks in Treatment: 10 Active Problems ICD-10 Encounter Code Description Active Date MDM Diagnosis L97.812 Non-pressure chronic ulcer of other part of right lower leg with fat layer 02/15/2023 No Yes exposed L97.822 Non-pressure chronic ulcer of other part of left lower leg with fat layer exposed8/14/2024 No Yes L97.321 Non-pressure chronic ulcer of  left ankle limited to breakdown of skin 03/27/2023 No Yes I87.2 Venous insufficiency (chronic) (peripheral) 01/11/2023 No Yes I50.9 Heart failure, unspecified 01/11/2023 No Yes R60.0 Localized edema 01/11/2023 No Yes I25.10 Atherosclerotic heart disease of native coronary artery without angina pectoris 01/11/2023 No Yes L84 Corns and callosities 02/07/2023 No Yes BRIAN, GIUSTINO ([id number]) [phone].pdf Page 5 of 10 Inactive Problems Resolved Problems Electronic Signature(s) Signed: 03/27/2023 2:52:57 PM By: Duanne Guess MD FACS Entered By: Duanne Guess on 03/27/2023 14:52:56 -------------------------------------------------------------------------------- Progress Note Details Patient Name: Date of Service: [name], Caine E. 03/27/2023 2:15 PM Medical Record Number: [id number] Patient Account Number: [id number] Date of Birth/Sex: Treating RN: 1932/06/06 (89 y.o. M) Primary Care Provider: Alcide Clever Other Clinician: Referring Provider: Treating Provider/Extender: Duanne Guess [facility], MICHELLE Weeks in Treatment: 10 Subjective Chief Complaint Information obtained from Patient Patient presents for treatment of an open ulcer due to venous insufficiency History of Present Illness (HPI) ADMISSION 01/11/2023 This is an 89 year old nondiabetic with a history of chronic venous insufficiency, coronary artery disease, congestive heart failure, and lower extremity edema. He has  historically been followed by podiatry for various ulcers and calluses on his feet. When he was seen in the middle of April, he was noted to have an ulcer starting on his left ankle. He was prescribed a course of cephalexin and advised to apply Betadine to the wound with an Ace wrap. He was referred to the wound care center for further evaluation and management. Apparently the wound closed of its own accord prior to his appointment with [place], but subsequently reopened. The notes from podiatry do indicate that compression and elevation were discussed as means to help heal ulceration and avoid future episodes. After the wound reopened, he was seen again and prescribed a course of Augmentin. He is now here for his wound care appointment. 01/21/2023: The wound is smaller and more superficial. It is fairly clean with just a little bit of light slough on the surface. His venous reflux studies are scheduled for this coming Thursday. 01/29/2023: The wound is smaller again today and very superficial. There is minimal slough present with a little eschar around the edges. His venous reflux studies were completed, and as expected, he has severe venous reflux. 02/07/2023: His wound is very nearly closed. He has an appointment coming up with vascular surgery on July 8. He indicated a callus on the bottom of his left foot that is bothering him today. 02/15/2023: There is just a pinhole opening remaining on the left, but unfortunately, he has had a new ulceration occur on the medial aspect of his right lower leg. The fat layer is exposed. He does have 2+ pitting edema in this leg. He says he noted fluid coming from the site a couple of days ago. He does not wear compression garments due to discomfort with stockings, but he has never received juxta lite or Farrow wrap garments. 02/22/2023: His left leg has healed. On the right, the open area has contracted considerably. There is some slough and eschar present. He did meet  with vascular surgery for his initial consultation and will return in 3 months to discuss saphenous vein ablation. 03/04/2023: His wraps slid down bilaterally and he is leaking clear fluid from the left leg. There is slough build up on the right lower leg wound, but the overall size is smaller. 03/11/2023: The area on his left leg where he was leaking fluid has closed. The right lower leg wound is smaller and clean. He does  have his Fabian November wraps with him today. 03/27/2023: At his nurse visit last week, his left medial ankle was found to be open. He has another new wound today on the left anterior tibial surface. The right ankle ulcer is smaller today. Everything is very superficial and clean. Patient History Information obtained from Patient, Chart. Family History Lung Disease - Siblings, Stroke - Siblings, No family history of Cancer, Diabetes, Heart Disease, Hereditary Spherocytosis, Hypertension, Kidney Disease, Seizures, Thyroid Problems, Tuberculosis. Social History Former smoker, Marital Status - Married, Alcohol Use - Moderate, Drug Use - No History, Caffeine Use - Rarely. Medical History Eyes DUVAL, WINCH ([id number]) [id number] Page 6 of 10 Patient has history of Cataracts - bil extractions Denies history of Glaucoma, Optic Neuritis Ear/Nose/Mouth/Throat Denies history of Chronic sinus problems/congestion, Middle ear problems Cardiovascular Patient has history of Congestive Heart Failure, Coronary Artery Disease, Hypertension, Peripheral Venous Disease Endocrine Denies history of Type I Diabetes, Type II Diabetes Integumentary (Skin) Denies history of History of Burn Musculoskeletal Patient has history of Osteoarthritis Neurologic Patient has history of Neuropathy Oncologic Denies history of Received Chemotherapy, Received Radiation Psychiatric Denies history of Anorexia/bulimia, Confinement Anxiety Hospitalization/Surgery History - bil carpal  tunnel release. - cardiac cath. - kyphoplasty. - ORIF patella left. - left total knee arthroplasty. - posterior lumbar fusion. - lumbar laminectomy. - posterior cervical fusion. - appendectomy. - blepharoplasty. - cholecystectomy. - bil catract extraction. - right knee replacement. - melanoma excision. - rectal surgery fissure. - vitrectomy. Medical A Surgical History Notes nd Ear/Nose/Mouth/Throat seasonal allergies Musculoskeletal myositis, myalgia, left patella fracture, bursitis of hips, lumbar compression fx, spondylolistthesis Neurologic bil carpal tunnel Oncologic skin cancers Objective Constitutional no acute distress. Vitals Time Taken: 2:22 PM, Height: 72 in, Weight: 198 lbs, BMI: 26.9, Temperature: 98.3 F, Pulse: 94 bpm, Respiratory Rate: 18 breaths/min, Blood Pressure: 117/56 mmHg. Respiratory Normal work of breathing on room air. General Notes: 03/27/2023: At his nurse visit last week, his left medial ankle was found to be open. He has another new wound today on the left anterior tibial surface. The right ankle ulcer is smaller today. Everything is very superficial and clean. Integumentary (Hair, Skin) Wound #2 status is Open. Original cause of wound was Gradually Appeared. The date acquired was: 02/12/2023. The wound has been in treatment 5 weeks. The wound is located on the Right,Medial Ankle. The wound measures 0.5cm length x 0.5cm width x 0.1cm depth; 0.196cm^2 area and 0.02cm^3 volume. There is Fat Layer (Subcutaneous Tissue) exposed. There is no tunneling or undermining noted. There is a medium amount of serosanguineous drainage noted. The wound margin is flat and intact. There is large (67-100%) red granulation within the wound bed. There is a small (1-33%) amount of necrotic tissue within the wound bed including Eschar and Adherent Slough. The periwound skin appearance had no abnormalities noted for texture. The periwound skin appearance exhibited: Dry/Scaly, Hemosiderin  Staining. Periwound temperature was noted as No Abnormality. Wound #4 status is Open. Original cause of wound was Blister. The date acquired was: 03/20/2023. The wound has been in treatment 1 weeks. The wound is located on the Left,Anterior Ankle. The wound measures 1.5cm length x 0.6cm width x 0.1cm depth; 0.707cm^2 area and 0.071cm^3 volume. There is Fat Layer (Subcutaneous Tissue) exposed. There is no tunneling or undermining noted. There is a medium amount of serous drainage noted. The wound margin is distinct with the outline attached to the wound base. There is large (67-100%) red granulation within the wound bed. There is a small (  1-33%) amount of necrotic tissue within the wound bed including Adherent Slough. The periwound skin appearance had no abnormalities noted for texture. The periwound skin appearance had no abnormalities noted for moisture. The periwound skin appearance exhibited: Hemosiderin Staining. Periwound temperature was noted as No Abnormality. Wound #5 status is Open. Original cause of wound was Gradually Appeared. The date acquired was: 03/27/2023. The wound is located on the Left,Anterior Lower Leg. The wound measures 0.3cm length x 0.3cm width x 0.1cm depth; 0.071cm^2 area and 0.007cm^3 volume. There is Fat Layer (Subcutaneous Tissue) exposed. There is no tunneling or undermining noted. There is a medium amount of serosanguineous drainage noted. The wound margin is distinct with the outline attached to the wound base. There is large (67-100%) red granulation within the wound bed. There is no necrotic tissue within the wound bed. The periwound skin appearance had no abnormalities noted for texture. The periwound skin appearance had no abnormalities noted for moisture. The periwound skin appearance exhibited: Hemosiderin Staining. Periwound temperature was noted as No Abnormality. Assessment Active Problems ICD-10 Non-pressure chronic ulcer of other part of right lower leg with  fat layer exposed RENOLD, [address] ([id number]) [phone].pdf Page 7 of 10 Non-pressure chronic ulcer of other part of left lower leg with fat layer exposed Non-pressure chronic ulcer of left ankle limited to breakdown of skin Venous insufficiency (chronic) (peripheral) Heart failure, unspecified Localized edema Atherosclerotic heart disease of native coronary artery without angina pectoris Corns and callosities Procedures Wound #2 Pre-procedure diagnosis of Wound #2 is a Venous Leg Ulcer located on the Right,Medial Ankle . There was a Double Layer Compression Therapy Procedure by Samuella Bruin, RN. Post procedure Diagnosis Wound #2: Same as Pre-Procedure Wound #4 Pre-procedure diagnosis of Wound #4 is a Venous Leg Ulcer located on the Left,Anterior Ankle . There was a Double Layer Compression Therapy Procedure by Samuella Bruin, RN. Post procedure Diagnosis Wound #4: Same as Pre-Procedure Plan Follow-up Appointments: Return Appointment in 1 week. - Dr. Lady Gary - room 2 Anesthetic: (In clinic) Topical Lidocaine 4% applied to wound bed Bathing/ Shower/ Hygiene: May shower with protection but do not get wound dressing(s) wet. Protect dressing(s) with water repellant cover (for example, large plastic bag) or a cast cover and may then take shower. Edema Control - Lymphedema / SCD / Other: Elevate legs to the level of the heart or above for 30 minutes daily and/or when sitting for 3-4 times a day throughout the day. - throughout the day Avoid standing for long periods of time. Exercise regularly Compression stocking or Garment 20-30 mm/Hg pressure to: - left leg daily The following medication(s) was prescribed: lidocaine topical 4 % cream cream topical was prescribed at facility WOUND #2: - Ankle Wound Laterality: Right, Medial Cleanser: Soap and Water 1 x Per Week/30 Days Discharge Instructions: May shower and wash wound with dial antibacterial soap and  water prior to dressing change. Cleanser: Wound Cleanser 1 x Per Week/30 Days Discharge Instructions: Cleanse the wound with wound cleanser prior to applying a clean dressing using gauze sponges, not tissue or cotton balls. Peri-Wound Care: Zinc Oxide Ointment 30g tube 1 x Per Week/30 Days Discharge Instructions: Apply Zinc Oxide to periwound with each dressing change Peri-Wound Care: Sween Lotion (Moisturizing lotion) 1 x Per Week/30 Days Discharge Instructions: Apply moisturizing lotion as directed Prim Dressing: Maxorb Extra Ag+ Alginate Dressing, 2x2 (in/in) 1 x Per Week/30 Days ary Discharge Instructions: Apply to wound bed as instructed Secondary Dressing: Woven Gauze Sponge, Non-Sterile 4x4 in 1 x  Per Week/30 Days Discharge Instructions: Apply over primary dressing as directed. Com pression Wrap: Urgo K2 Lite, (equivalent to a 3 layer) two layer compression system, regular 1 x Per Week/30 Days Discharge Instructions: Apply Urgo K2 Lite as directed (alternative to 3 layer compression). Com pression Stockings: Jobst Farrow Wrap 4000 Compression Amount: 30-40 mmHg (right) Discharge Instructions: Apply Renee Pain daily as instructed. Apply first thing in the morning, remove at night before bed. WOUND #4: - Ankle Wound Laterality: Left, Anterior Cleanser: Soap and Water 1 x Per Week/30 Days Discharge Instructions: May shower and wash wound with dial antibacterial soap and water prior to dressing change. Cleanser: Wound Cleanser 1 x Per Week/30 Days Discharge Instructions: Cleanse the wound with wound cleanser prior to applying a clean dressing using gauze sponges, not tissue or cotton balls. Peri-Wound Care: Zinc Oxide Ointment 30g tube 1 x Per Week/30 Days Discharge Instructions: Apply Zinc Oxide to periwound with each dressing change Peri-Wound Care: Sween Lotion (Moisturizing lotion) 1 x Per Week/30 Days Discharge Instructions: Apply moisturizing lotion as directed Prim Dressing:  Maxorb Extra Ag+ Alginate Dressing, 2x2 (in/in) 1 x Per Week/30 Days ary Discharge Instructions: Apply to wound bed as instructed Secondary Dressing: Woven Gauze Sponge, Non-Sterile 4x4 in 1 x Per Week/30 Days Discharge Instructions: Apply over primary dressing as directed. Com pression Wrap: Urgo K2 Lite, (equivalent to a 3 layer) two layer compression system, regular 1 x Per Week/30 Days Discharge Instructions: Apply Urgo K2 Lite as directed (alternative to 3 layer compression). WOUND #5: - Lower Leg Wound Laterality: Left, Anterior Cleanser: Soap and Water 1 x Per Week/30 Days Discharge Instructions: May shower and wash wound with dial antibacterial soap and water prior to dressing change. Cleanser: Wound Cleanser 1 x Per Week/30 Days Discharge Instructions: Cleanse the wound with wound cleanser prior to applying a clean dressing using gauze sponges, not tissue or cotton balls. Peri-Wound Care: Zinc Oxide Ointment 30g tube 1 x Per Week/30 Days Discharge Instructions: Apply Zinc Oxide to periwound with each dressing change Peri-Wound Care: Sween Lotion (Moisturizing lotion) 1 x Per Week/30 Days Discharge Instructions: Apply moisturizing lotion as directed Prim Dressing: Maxorb Extra Ag+ Alginate Dressing, 2x2 (in/in) 1 x Per Week/30 Days MAZE, SHARAF ([id number]) [id number] Page 8 of 10 Discharge Instructions: Apply to wound bed as instructed Secondary Dressing: Woven Gauze Sponge, Non-Sterile 4x4 in 1 x Per Week/30 Days Discharge Instructions: Apply over primary dressing as directed. Compression Wrap: Urgo K2 Lite, (equivalent to a 3 layer) two layer compression system, regular 1 x Per Week/30 Days Discharge Instructions: Apply Urgo K2 Lite as directed (alternative to 3 layer compression). 03/27/2023: At his nurse visit last week, his left medial ankle was found to be open. He has another new wound today on the left anterior tibial surface. The right  ankle ulcer is smaller today. Everything is very superficial and clean. None of his wounds require debridement today. We will apply silver alginate and bilateral Urgo lite compression wraps. Follow-up in 1 week. Electronic Signature(s) Signed: 03/27/2023 2:56:28 PM By: Duanne Guess MD FACS Entered By: Duanne Guess on 03/27/2023 14:56:28 -------------------------------------------------------------------------------- HxROS Details Patient Name: Date of Service: [name], Cadarius E. 03/27/2023 2:15 PM Medical Record Number: [id number] Patient Account Number: [id number] Date of Birth/Sex: Treating RN: 1932/05/10 (89 y.o. M) Primary Care Provider: Alcide Clever Other Clinician: Referring Provider: Treating Provider/Extender: Duanne Guess [facility], MICHELLE Weeks in Treatment: 10 Information Obtained From Patient Chart Eyes Medical History: Positive for: Cataracts - bil extractions  Negative for: Glaucoma; Optic Neuritis Ear/Nose/Mouth/Throat Medical History: Negative for: Chronic sinus problems/congestion; Middle ear problems Past Medical History Notes: seasonal allergies Cardiovascular Medical History: Positive for: Congestive Heart Failure; Coronary Artery Disease; Hypertension; Peripheral Venous Disease Endocrine Medical History: Negative for: Type I Diabetes; Type II Diabetes Integumentary (Skin) Medical History: Negative for: History of Burn Musculoskeletal Medical History: Positive for: Osteoarthritis Past Medical History Notes: myositis, myalgia, left patella fracture, bursitis of hips, lumbar compression fx, spondylolistthesis Neurologic Medical History: Positive for: Neuropathy Past Medical History NotesKENZEL, AZUCENA ([id number]) [phone].pdf Page 9 of 10 bil carpal tunnel Oncologic Medical History: Negative for: Received Chemotherapy; Received Radiation Past Medical History Notes: skin cancers Psychiatric Medical  History: Negative for: Anorexia/bulimia; Confinement Anxiety HBO Extended History Items Eyes: Cataracts Immunizations Pneumococcal Vaccine: Received Pneumococcal Vaccination: No Implantable Devices No devices added Hospitalization / Surgery History Type of Hospitalization/Surgery bil carpal tunnel release cardiac cath kyphoplasty ORIF patella left left total knee arthroplasty posterior lumbar fusion lumbar laminectomy posterior cervical fusion appendectomy blepharoplasty cholecystectomy bil catract extraction right knee replacement melanoma excision rectal surgery fissure vitrectomy Family and Social History Cancer: No; Diabetes: No; Heart Disease: No; Hereditary Spherocytosis: No; Hypertension: No; Kidney Disease: No; Lung Disease: Yes - Siblings; Seizures: No; Stroke: Yes - Siblings; Thyroid Problems: No; Tuberculosis: No; Former smoker; Marital Status - Married; Alcohol Use: Moderate; Drug Use: No History; Caffeine Use: Rarely; Financial Concerns: No; Food, Clothing or Shelter Needs: No; Support System Lacking: No; Transportation Concerns: No Electronic Signature(s) Signed: 03/27/2023 2:57:57 PM By: Duanne Guess MD FACS Entered By: Duanne Guess on 03/27/2023 14:55:09 -------------------------------------------------------------------------------- SuperBill Details Patient Name: Date of Service: Leone Payor [name], Dailan E. 03/27/2023 Medical Record Number: [id number] Patient Account Number: [id number] Date of Birth/Sex: Treating RN: 01-16-1932 (89 y.o. M) Primary Care Provider: Alcide Clever Other Clinician: Referring Provider: Treating Provider/Extender: Duanne Guess [facility], MICHELLE Weeks in Treatment: 10 Diagnosis Coding ICD-10 Codes BRODERICK, COLLAMORE ([id number]) [id number] Page 10 of 10 Code Description [phone] Non-pressure chronic ulcer of other part of right lower leg with fat layer exposed L97.822 Non-pressure chronic  ulcer of other part of left lower leg with fat layer exposed L97.321 Non-pressure chronic ulcer of left ankle limited to breakdown of skin I87.2 Venous insufficiency (chronic) (peripheral) I50.9 Heart failure, unspecified R60.0 Localized edema I25.10 Atherosclerotic heart disease of native coronary artery without angina pectoris L84 Corns and callosities Facility Procedures : CPT4: Code [id number] Description: 81 BILATERAL: Application of multi-layer venous compression system; leg (below knee), including ankle and t. ICD-10 Diagnosis Description 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ed L97.822  Non-pressure chronic ulcer of other part of left lower leg with fat layer exposed Modifier: Quantity: 1 Physician Procedures : CPT4 Code Description Modifier [id number] - WC PHYS LEVEL 3 - EST PT ICD-10 Diagnosis Description 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ed L97.822 Non-pressure chronic ulcer of other part of left  lower leg with fat layer exposed L97.321 Non-pressure chronic ulcer of left ankle limited to breakdown of skin I87.2 Venous insufficiency (chronic) (peripheral) Quantity: 1 Electronic Signature(s) Signed: 03/27/2023 3:46:04 PM By: Duanne Guess MD FACS Signed: 03/27/2023 4:38:35 PM By: Samuella Bruin Previous Signature: 03/27/2023 2:56:52 PM Version By: Duanne Guess MD FACS Entered By: Samuella Bruin on 03/27/2023 15:26:34

## 2023-04-04 ENCOUNTER — Encounter (HOSPITAL_BASED_OUTPATIENT_CLINIC_OR_DEPARTMENT_OTHER): Payer: Medicare Other | Admitting: General Surgery

## 2023-04-04 DIAGNOSIS — L97812 Non-pressure chronic ulcer of other part of right lower leg with fat layer exposed: Secondary | ICD-10-CM | POA: Diagnosis not present

## 2023-04-04 NOTE — Progress Notes (Signed)
[name], [name] ([id number]) [id number] Page 1 of 12 Visit Report for 04/04/2023 Chief Complaint Document Details Patient Name: Date of Service: [place] [name] 04/04/2023 2:15 PM Medical Record Number: [id number] Patient Account Number: [id number] Date of Birth/Sex: Treating RN: 12/29/1931 (89 y.o. M) Primary Care Provider: Alcide Clever Other Clinician: Referring Provider: Treating Provider/Extender: Duanne Guess [facility], MICHELLE Weeks in Treatment: 11 Information Obtained from: Patient Chief Complaint Patient presents for treatment of an open ulcer due to venous insufficiency Electronic Signature(s) Signed: 04/04/2023 2:58:47 PM By: Duanne Guess MD FACS Entered By: Duanne Guess on 04/04/2023 11:58:47 -------------------------------------------------------------------------------- Debridement Details Patient Name: Date of Service: [name], Jordan E. 04/04/2023 2:15 PM Medical Record Number: [id number] Patient Account Number: [id number] Date of Birth/Sex: Treating RN: May 04, 1932 (89 y.o. Marlan Palau Primary Care Provider: Alcide Clever Other Clinician: Referring Provider: Treating Provider/Extender: Duanne Guess [facility], MICHELLE Weeks in Treatment: 11 Debridement Performed for Assessment: Wound #7 Right,Lateral Lower Leg Performed By: Physician Duanne Guess, MD Debridement Type: Debridement Severity of Tissue Pre Debridement: Fat layer exposed Level of Consciousness (Pre-procedure): Awake and Alert Pre-procedure Verification/Time Out Yes - 14:44 Taken: Start Time: 14:44 Pain Control: Lidocaine 4% T opical Solution Percent of Wound Bed Debrided: 100% T Area Debrided (cm): otal 0.71 Tissue and other material debrided: Non-Viable, Slough, Skin: Epidermis, Slough Level: Skin/Epidermis Debridement Description: Selective/Open Wound Instrument: Curette Bleeding: Minimum Hemostasis Achieved:  Pressure Response to Treatment: Procedure was tolerated well Level of Consciousness (Post- Awake and Alert procedure): Post Debridement Measurements of Total Wound Length: (cm) 1 Width: (cm) 0.9 Depth: (cm) 0.1 Volume: (cm) 0.071 Character of Wound/Ulcer Post Debridement: Improved Severity of Tissue Post Debridement: Fat layer exposed Post Procedure Diagnosis PADRAIG, CHIDESTER E ([id number]) [id number] Page 2 of 12 Same as Pre-procedure Notes scribed for Dr. Lady Gary by Samuella Bruin, RN Electronic Signature(s) Signed: 04/04/2023 3:06:26 PM By: Duanne Guess MD FACS Signed: 04/04/2023 3:57:51 PM By: Samuella Bruin Entered By: Samuella Bruin on 04/04/2023 11:45:56 -------------------------------------------------------------------------------- HPI Details Patient Name: Date of Service: [name], Jordan E. 04/04/2023 2:15 PM Medical Record Number: [id number] Patient Account Number: [id number] Date of Birth/Sex: Treating RN: 09-Jun-1932 (89 y.o. M) Primary Care Provider: Alcide Clever Other Clinician: Referring Provider: Treating Provider/Extender: Duanne Guess [facility], MICHELLE Weeks in Treatment: 11 History of Present Illness HPI Description: ADMISSION 01/11/2023 This is an 89 year old nondiabetic with a history of chronic venous insufficiency, coronary artery disease, congestive heart failure, and lower extremity edema. He has historically been followed by podiatry for various ulcers and calluses on his feet. When he was seen in the middle of April, he was noted to have an ulcer starting on his left ankle. He was prescribed a course of cephalexin and advised to apply Betadine to the wound with an Ace wrap. He was referred to the wound care center for further evaluation and management. Apparently the wound closed of its own accord prior to his appointment with [place], but subsequently reopened. The notes from podiatry do indicate that  compression and elevation were discussed as means to help heal ulceration and avoid future episodes. After the wound reopened, he was seen again and prescribed a course of Augmentin. He is now here for his wound care appointment. 01/21/2023: The wound is smaller and more superficial. It is fairly clean with just a little bit of light slough on the surface. His venous reflux studies are scheduled for this coming Thursday. 01/29/2023: The wound is smaller again today and very superficial. There is minimal slough present with  a little eschar around the edges. His venous reflux studies were completed, and as expected, he has severe venous reflux. 02/07/2023: His wound is very nearly closed. He has an appointment coming up with vascular surgery on July 8. He indicated a callus on the bottom of his left foot that is bothering him today. 02/15/2023: There is just a pinhole opening remaining on the left, but unfortunately, he has had a new ulceration occur on the medial aspect of his right lower leg. The fat layer is exposed. He does have 2+ pitting edema in this leg. He says he noted fluid coming from the site a couple of days ago. He does not wear compression garments due to discomfort with stockings, but he has never received juxta lite or Farrow wrap garments. 02/22/2023: His left leg has healed. On the right, the open area has contracted considerably. There is some slough and eschar present. He did meet with vascular surgery for his initial consultation and will return in 3 months to discuss saphenous vein ablation. 03/04/2023: His wraps slid down bilaterally and he is leaking clear fluid from the left leg. There is slough build up on the right lower leg wound, but the overall size is smaller. 03/11/2023: The area on his left leg where he was leaking fluid has closed. The right lower leg wound is smaller and clean. He does have his Farrow wraps with him today. 03/27/2023: At his nurse visit last week, his left  medial ankle was found to be open. He has another new wound today on the left anterior tibial surface. The right ankle ulcer is smaller today. Everything is very superficial and clean. 04/04/2023: Despite being in bilateral 3 layer compression equivalent, he has developed new wounds. All of these seem to be secondary to blistering. He has a new wound on his right lateral lower leg just above the ankle and 2 new sites on his left anterolateral lower leg. These are fairly tender and a the skin is little bit erythematous around them. Electronic Signature(s) Signed: 04/04/2023 3:00:53 PM By: Duanne Guess MD FACS Entered By: Duanne Guess on 04/04/2023 12:00:53 [name] ([id number]) [id number] Page 3 of 12 -------------------------------------------------------------------------------- Physical Exam Details Patient Name: Date of Service: [place] [name] 04/04/2023 2:15 PM Medical Record Number: [id number] Patient Account Number: [id number] Date of Birth/Sex: Treating RN: 10/26/1931 (89 y.o. M) Primary Care Provider: Alcide Clever Other Clinician: Referring Provider: Treating Provider/Extender: Duanne Guess [name], MICHELLE Weeks in Treatment: 11 Constitutional . . . . no acute distress. Respiratory Normal work of breathing on room air. Notes 04/04/2023: He has developed new wounds. All of these seem to be secondary to blistering. He has a new wound on his right lateral lower leg just above the ankle and 2 new sites on his left anterolateral lower leg. These are fairly tender and a the skin is little bit erythematous around them. Electronic Signature(s) Signed: 04/04/2023 3:03:29 PM By: Duanne Guess MD FACS Entered By: Duanne Guess on 04/04/2023 12:03:29 -------------------------------------------------------------------------------- Physician Orders Details Patient Name: Date of Service: [name], Jordan E. 04/04/2023 2:15  PM Medical Record Number: [id number] Patient Account Number: [id number] Date of Birth/Sex: Treating RN: 1932-02-27 (89 y.o. Marlan Palau Primary Care Provider: Alcide Clever Other Clinician: Referring Provider: Treating Provider/Extender: Duanne Guess [facility], MICHELLE Weeks in Treatment: 55 Verbal / Phone Orders: No Diagnosis Coding ICD-10 Coding Code Description [phone] Non-pressure chronic ulcer of other part of right lower leg with fat layer exposed L97.822 Non-pressure chronic  ulcer of other part of left lower leg with fat layer exposed L97.321 Non-pressure chronic ulcer of left ankle limited to breakdown of skin I87.2 Venous insufficiency (chronic) (peripheral) I50.9 Heart failure, unspecified R60.0 Localized edema I25.10 Atherosclerotic heart disease of native coronary artery without angina pectoris L84 Corns and callosities Follow-up Appointments ppointment in 1 week. - Dr. Lady Gary - room 2 Return A Anesthetic (In clinic) Topical Lidocaine 4% applied to wound bed Bathing/ Shower/ Hygiene May shower with protection but do not get wound dressing(s) wet. Protect dressing(s) with water repellant cover (for example, large plastic bag) or a cast cover and may then take shower. Edema Control - Lymphedema / SCD / Other Elevate legs to the level of the heart or above for 30 minutes daily and/or when sitting for 3-4 times a day throughout the day. - throughout the day Avoid standing for long periods of time. Exercise regularly Compression stocking or Garment 20-30 mm/Hg pressure to: - left leg daily Wound Treatment Wound #4 - Ankle Wound Laterality: Left, Medial WOFFORD, BILLOCK E ([id number]) [id number] Page 4 of 12 Cleanser: Soap and Water 1 x Per Week/30 Days Discharge Instructions: May shower and wash wound with dial antibacterial soap and water prior to dressing change. Cleanser: Wound Cleanser 1 x Per Week/30 Days Discharge  Instructions: Cleanse the wound with wound cleanser prior to applying a clean dressing using gauze sponges, not tissue or cotton balls. Peri-Wound Care: Sween Lotion (Moisturizing lotion) 1 x Per Week/30 Days Discharge Instructions: Apply moisturizing lotion as directed Prim Dressing: Maxorb Extra Ag+ Alginate Dressing, 2x2 (in/in) 1 x Per Week/30 Days ary Discharge Instructions: Apply to wound bed as instructed Secondary Dressing: Woven Gauze Sponge, Non-Sterile 4x4 in 1 x Per Week/30 Days Discharge Instructions: Apply over primary dressing as directed. Compression Wrap: Urgo K2, (equivalent to a 4 layer) two layer compression system, regular 1 x Per Week/30 Days Discharge Instructions: Apply Urgo K2 as directed (alternative to 4 layer compression). Wound #5 - Lower Leg Wound Laterality: Left, Anterior Cleanser: Soap and Water 1 x Per Week/30 Days Discharge Instructions: May shower and wash wound with dial antibacterial soap and water prior to dressing change. Cleanser: Wound Cleanser 1 x Per Week/30 Days Discharge Instructions: Cleanse the wound with wound cleanser prior to applying a clean dressing using gauze sponges, not tissue or cotton balls. Peri-Wound Care: Sween Lotion (Moisturizing lotion) 1 x Per Week/30 Days Discharge Instructions: Apply moisturizing lotion as directed Prim Dressing: Maxorb Extra Ag+ Alginate Dressing, 2x2 (in/in) 1 x Per Week/30 Days ary Discharge Instructions: Apply to wound bed as instructed Secondary Dressing: Woven Gauze Sponge, Non-Sterile 4x4 in 1 x Per Week/30 Days Discharge Instructions: Apply over primary dressing as directed. Compression Wrap: Urgo K2, (equivalent to a 4 layer) two layer compression system, regular 1 x Per Week/30 Days Discharge Instructions: Apply Urgo K2 as directed (alternative to 4 layer compression). Wound #6 - Lower Leg Wound Laterality: Left, Lateral Cleanser: Soap and Water 1 x Per Week/30 Days Discharge Instructions: May  shower and wash wound with dial antibacterial soap and water prior to dressing change. Cleanser: Wound Cleanser 1 x Per Week/30 Days Discharge Instructions: Cleanse the wound with wound cleanser prior to applying a clean dressing using gauze sponges, not tissue or cotton balls. Peri-Wound Care: Triamcinolone 15 (g) 1 x Per Week/30 Days Discharge Instructions: Use triamcinolone 15 (g) as directed Peri-Wound Care: Sween Lotion (Moisturizing lotion) 1 x Per Week/30 Days Discharge Instructions: Apply moisturizing lotion as directed Topical: Gentamicin 1 x  Per Week/30 Days Discharge Instructions: As directed by physician Topical: Mupirocin Ointment 1 x Per Week/30 Days Discharge Instructions: Apply Mupirocin (Bactroban) as instructed Prim Dressing: Maxorb Extra Ag+ Alginate Dressing, 2x2 (in/in) 1 x Per Week/30 Days ary Discharge Instructions: Apply to wound bed as instructed Secondary Dressing: Woven Gauze Sponge, Non-Sterile 4x4 in 1 x Per Week/30 Days Discharge Instructions: Apply over primary dressing as directed. Compression Wrap: Urgo K2, (equivalent to a 4 layer) two layer compression system, regular 1 x Per Week/30 Days Discharge Instructions: Apply Urgo K2 as directed (alternative to 4 layer compression). Wound #7 - Lower Leg Wound Laterality: Right, Lateral Cleanser: Soap and Water 1 x Per Week/30 Days Discharge Instructions: May shower and wash wound with dial antibacterial soap and water prior to dressing change. Cleanser: Wound Cleanser 1 x Per Week/30 Days Discharge Instructions: Cleanse the wound with wound cleanser prior to applying a clean dressing using gauze sponges, not tissue or cotton balls. Peri-Wound Care: Triamcinolone 15 (g) 1 x Per Week/30 Days Discharge Instructions: Use triamcinolone 15 (g) as directed Peri-Wound Care: Sween Lotion (Moisturizing lotion) 1 x Per Week/30 Days HONORIO, TRUNDY ([id number]) [phone].pdf Page 5 of 12 Discharge  Instructions: Apply moisturizing lotion as directed Topical: Gentamicin 1 x Per Week/30 Days Discharge Instructions: As directed by physician Topical: Mupirocin Ointment 1 x Per Week/30 Days Discharge Instructions: Apply Mupirocin (Bactroban) as instructed Prim Dressing: Maxorb Extra Ag+ Alginate Dressing, 2x2 (in/in) 1 x Per Week/30 Days ary Discharge Instructions: Apply to wound bed as instructed Secondary Dressing: Woven Gauze Sponge, Non-Sterile 4x4 in 1 x Per Week/30 Days Discharge Instructions: Apply over primary dressing as directed. Compression Wrap: Urgo K2, (equivalent to a 4 layer) two layer compression system, regular 1 x Per Week/30 Days Discharge Instructions: Apply Urgo K2 as directed (alternative to 4 layer compression). Patient Medications llergies: amlodipine, Lyrica, gabapentin A Notifications Medication Indication Start End 04/04/2023 lidocaine DOSE topical 4 % cream - cream topical Electronic Signature(s) Signed: 04/04/2023 3:06:26 PM By: Duanne Guess MD FACS Entered By: Duanne Guess on 04/04/2023 12:03:57 -------------------------------------------------------------------------------- Problem List Details Patient Name: Date of Service: [name], Lauro E. 04/04/2023 2:15 PM Medical Record Number: [id number] Patient Account Number: [id number] Date of Birth/Sex: Treating RN: 1932-02-21 (89 y.o. M) Primary Care Provider: Alcide Clever Other Clinician: Referring Provider: Treating Provider/Extender: Duanne Guess [facility], MICHELLE Weeks in Treatment: 56 Active Problems ICD-10 Encounter Code Description Active Date MDM Diagnosis L97.812 Non-pressure chronic ulcer of other part of right lower leg with fat layer 02/15/2023 No Yes exposed L97.822 Non-pressure chronic ulcer of other part of left lower leg with fat layer exposed8/14/2024 No Yes L97.321 Non-pressure chronic ulcer of left ankle limited to breakdown of skin 03/27/2023 No Yes I87.2 Venous  insufficiency (chronic) (peripheral) 01/11/2023 No Yes I50.9 Heart failure, unspecified 01/11/2023 No Yes MANOA, ANDRESS E ([id number]) [id number] Page 6 of 12 R60.0 Localized edema 01/11/2023 No Yes I25.10 Atherosclerotic heart disease of native coronary artery without angina pectoris 01/11/2023 No Yes L84 Corns and callosities 02/07/2023 No Yes Inactive Problems Resolved Problems Electronic Signature(s) Signed: 04/04/2023 2:57:39 PM By: Duanne Guess MD FACS Entered By: Duanne Guess on 04/04/2023 11:57:38 -------------------------------------------------------------------------------- Progress Note Details Patient Name: Date of Service: [name], Jermichael E. 04/04/2023 2:15 PM Medical Record Number: [id number] Patient Account Number: [id number] Date of Birth/Sex: Treating RN: 1932/01/07 (89 y.o. M) Primary Care Provider: Alcide Clever Other Clinician: Referring Provider: Treating Provider/Extender: Duanne Guess [facility], MICHELLE Weeks in Treatment: 11 Subjective Chief Complaint Information obtained from Patient  Patient presents for treatment of an open ulcer due to venous insufficiency History of Present Illness (HPI) ADMISSION 01/11/2023 This is an 89 year old nondiabetic with a history of chronic venous insufficiency, coronary artery disease, congestive heart failure, and lower extremity edema. He has historically been followed by podiatry for various ulcers and calluses on his feet. When he was seen in the middle of April, he was noted to have an ulcer starting on his left ankle. He was prescribed a course of cephalexin and advised to apply Betadine to the wound with an Ace wrap. He was referred to the wound care center for further evaluation and management. Apparently the wound closed of its own accord prior to his appointment with [place], but subsequently reopened. The notes from podiatry do indicate that compression and elevation were discussed as  means to help heal ulceration and avoid future episodes. After the wound reopened, he was seen again and prescribed a course of Augmentin. He is now here for his wound care appointment. 01/21/2023: The wound is smaller and more superficial. It is fairly clean with just a little bit of light slough on the surface. His venous reflux studies are scheduled for this coming Thursday. 01/29/2023: The wound is smaller again today and very superficial. There is minimal slough present with a little eschar around the edges. His venous reflux studies were completed, and as expected, he has severe venous reflux. 02/07/2023: His wound is very nearly closed. He has an appointment coming up with vascular surgery on July 8. He indicated a callus on the bottom of his left foot that is bothering him today. 02/15/2023: There is just a pinhole opening remaining on the left, but unfortunately, he has had a new ulceration occur on the medial aspect of his right lower leg. The fat layer is exposed. He does have 2+ pitting edema in this leg. He says he noted fluid coming from the site a couple of days ago. He does not wear compression garments due to discomfort with stockings, but he has never received juxta lite or Farrow wrap garments. 02/22/2023: His left leg has healed. On the right, the open area has contracted considerably. There is some slough and eschar present. He did meet with vascular surgery for his initial consultation and will return in 3 months to discuss saphenous vein ablation. 03/04/2023: His wraps slid down bilaterally and he is leaking clear fluid from the left leg. There is slough build up on the right lower leg wound, but the overall size is smaller. 03/11/2023: The area on his left leg where he was leaking fluid has closed. The right lower leg wound is smaller and clean. He does have his Farrow wraps with him today. 03/27/2023: At his nurse visit last week, his left medial ankle was found to be open. He has  another new wound today on the left anterior tibial surface. The right ankle ulcer is smaller today. Everything is very superficial and clean. 04/04/2023: Despite being in bilateral 3 layer compression equivalent, he has developed new wounds. All of these seem to be secondary to blistering. He has a new wound on his right lateral lower leg just above the ankle and 2 new sites on his left anterolateral lower leg. These are fairly tender and a the skin is little bit JAVIEN, MULLER E ([id number]) [id number] Page 7 of 12 erythematous around them. Patient History Information obtained from Patient, Chart. Family History Lung Disease - Siblings, Stroke - Siblings, No family history of Cancer, Diabetes, Heart Disease,  Hereditary Spherocytosis, Hypertension, Kidney Disease, Seizures, Thyroid Problems, Tuberculosis. Social History Former smoker, Marital Status - Married, Alcohol Use - Moderate, Drug Use - No History, Caffeine Use - Rarely. Medical History Eyes Patient has history of Cataracts - bil extractions Denies history of Glaucoma, Optic Neuritis Ear/Nose/Mouth/Throat Denies history of Chronic sinus problems/congestion, Middle ear problems Cardiovascular Patient has history of Congestive Heart Failure, Coronary Artery Disease, Hypertension, Peripheral Venous Disease Endocrine Denies history of Type I Diabetes, Type II Diabetes Integumentary (Skin) Denies history of History of Burn Musculoskeletal Patient has history of Osteoarthritis Neurologic Patient has history of Neuropathy Oncologic Denies history of Received Chemotherapy, Received Radiation Psychiatric Denies history of Anorexia/bulimia, Confinement Anxiety Hospitalization/Surgery History - bil carpal tunnel release. - cardiac cath. - kyphoplasty. - ORIF patella left. - left total knee arthroplasty. - posterior lumbar fusion. - lumbar laminectomy. - posterior cervical fusion. - appendectomy. -  blepharoplasty. - cholecystectomy. - bil catract extraction. - right knee replacement. - melanoma excision. - rectal surgery fissure. - vitrectomy. Medical A Surgical History Notes nd Ear/Nose/Mouth/Throat seasonal allergies Musculoskeletal myositis, myalgia, left patella fracture, bursitis of hips, lumbar compression fx, spondylolistthesis Neurologic bil carpal tunnel Oncologic skin cancers Objective Constitutional no acute distress. Vitals Time Taken: 2:16 PM, Height: 72 in, Weight: 198 lbs, BMI: 26.9, Temperature: 98.4 F, Pulse: 83 bpm, Respiratory Rate: 18 breaths/min, Blood Pressure: 132/63 mmHg. Respiratory Normal work of breathing on room air. General Notes: 04/04/2023: He has developed new wounds. All of these seem to be secondary to blistering. He has a new wound on his right lateral lower leg just above the ankle and 2 new sites on his left anterolateral lower leg. These are fairly tender and a the skin is little bit erythematous around them. Integumentary (Hair, Skin) Wound #2 status is Open. Original cause of wound was Gradually Appeared. The date acquired was: 02/12/2023. The wound has been in treatment 6 weeks. The wound is located on the Right,Medial Ankle. The wound measures 0cm length x 0cm width x 0cm depth; 0cm^2 area and 0cm^3 volume. There is no tunneling or undermining noted. There is a none present amount of drainage noted. The wound margin is flat and intact. There is no granulation within the wound bed. There is no necrotic tissue within the wound bed. The periwound skin appearance had no abnormalities noted for texture. The periwound skin appearance exhibited: Dry/Scaly, Hemosiderin Staining. Periwound temperature was noted as No Abnormality. Wound #4 status is Open. Original cause of wound was Blister. The date acquired was: 03/20/2023. The wound has been in treatment 2 weeks. The wound is located on the Left,Medial Ankle. The wound measures 0.7cm length x 1.5cm  width x 0.1cm depth; 0.825cm^2 area and 0.082cm^3 volume. There is Fat Layer (Subcutaneous Tissue) exposed. There is no tunneling or undermining noted. There is a medium amount of serous drainage noted. The wound margin is distinct with the outline attached to the wound base. There is large (67-100%) red granulation within the wound bed. There is a small (1-33%) amount of necrotic tissue within the wound bed. The periwound skin appearance had no abnormalities noted for texture. The periwound skin appearance had no abnormalities noted for moisture. The periwound skin appearance exhibited: Hemosiderin Staining. Periwound temperature was noted as No Abnormality. Wound #5 status is Open. Original cause of wound was Gradually Appeared. The date acquired was: 03/27/2023. The wound has been in treatment 1 weeks. The wound is located on the Left,Anterior Lower Leg. The wound measures 0.9cm length x 1.1cm width x  0.1cm depth; 0.778cm^2 area and 0.078cm^3 volume. There is Fat Layer (Subcutaneous Tissue) exposed. There is no tunneling or undermining noted. There is a medium amount of serosanguineous drainage noted. JEBEDIAH, Jordan ([id number]) [id number] Page 8 of 12 The wound margin is distinct with the outline attached to the wound base. There is large (67-100%) red granulation within the wound bed. There is a small (1-33%) amount of necrotic tissue within the wound bed including Adherent Slough. The periwound skin appearance had no abnormalities noted for texture. The periwound skin appearance had no abnormalities noted for moisture. The periwound skin appearance exhibited: Hemosiderin Staining. Periwound temperature was noted as No Abnormality. Wound #6 status is Open. Original cause of wound was Blister. The date acquired was: 04/04/2023. The wound is located on the Left,Lateral Lower Leg. The wound measures 5cm length x 3.5cm width x 0.1cm depth; 13.744cm^2 area and 1.374cm^3  volume. There is Fat Layer (Subcutaneous Tissue) exposed. There is no tunneling or undermining noted. There is a medium amount of serous drainage noted. The wound margin is distinct with the outline attached to the wound base. There is large (67-100%) red granulation within the wound bed. There is a small (1-33%) amount of necrotic tissue within the wound bed including Adherent Slough. The periwound skin appearance had no abnormalities noted for texture. The periwound skin appearance exhibited: Dry/Scaly, Hemosiderin Staining. Periwound temperature was noted as No Abnormality. Wound #7 status is Open. Original cause of wound was Blister. The date acquired was: 04/04/2023. The wound is located on the Right,Lateral Lower Leg. The wound measures 1cm length x 0.9cm width x 0.1cm depth; 0.707cm^2 area and 0.071cm^3 volume. There is Fat Layer (Subcutaneous Tissue) exposed. There is no tunneling or undermining noted. There is a medium amount of purulent drainage noted. The wound margin is distinct with the outline attached to the wound base. There is small (1-33%) red, pink granulation within the wound bed. There is a large (67-100%) amount of necrotic tissue within the wound bed including Adherent Slough. The periwound skin appearance had no abnormalities noted for texture. The periwound skin appearance had no abnormalities noted for moisture. The periwound skin appearance exhibited: Hemosiderin Staining. Periwound temperature was noted as No Abnormality. 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 There was a Selective/Open Wound Skin/Epidermis Debridement with a total area of 0.71 sq cm performed by Duanne Guess, MD. With the following instrument(s): Curette to remove Non-Viable tissue/material. Material removed includes [facility] and Skin: Epidermis and after achieving pain control using Lidocaine 4% T opical Solution. No specimens were taken. A time out was conducted at 14:44, prior to the start of the procedure. A Minimum amount of bleeding was controlled with Pressure. The procedure was tolerated well. Post Debridement Measurements: 1cm length x 0.9cm width x 0.1cm depth; 0.071cm^3 volume. Character of Wound/Ulcer Post Debridement is improved. Severity of Tissue Post Debridement is: Fat layer exposed. Post procedure Diagnosis Wound #7: Same as Pre-Procedure General Notes: scribed for Dr. Lady Gary by Samuella Bruin, RN. Pre-procedure  diagnosis of Wound #7 is a Venous Leg Ulcer located on the Right,Lateral Lower Leg . There was a Double Layer Compression Therapy Procedure by Samuella Bruin, RN. Post procedure Diagnosis Wound #7: Same as Pre-Procedure Wound #4 Pre-procedure diagnosis of Wound #4 is a Venous Leg Ulcer located on the Left,Medial Ankle . There was a Double Layer Compression Therapy Procedure by Samuella Bruin, RN. Post procedure Diagnosis Wound #4: Same as Pre-Procedure Plan Follow-up Appointments: Return Appointment in 1 week. - Dr. Lady Gary - room 2 Anesthetic: (In clinic) Topical Lidocaine 4% applied to wound bed Bathing/ Shower/ Hygiene: May shower with protection but do not get wound dressing(s) wet. Protect dressing(s) with water repellant cover (for example, large plastic bag) or a cast cover and may then take shower. Edema Control - Lymphedema / SCD / Other: Elevate  legs to the level of the heart or above for 30 minutes daily and/or when sitting for 3-4 times a day throughout the day. - throughout the day Avoid standing for long periods of time. Exercise regularly Compression stocking or Garment 20-30 mm/Hg pressure to: - left leg daily The following medication(s) was prescribed: lidocaine topical 4 % cream cream topical was prescribed at facility WOUND #4: - Ankle Wound Laterality: Left, Medial Cleanser: Soap and Water 1 x Per Week/30 Days Discharge Instructions: May shower and wash wound with dial antibacterial soap and water prior to dressing change. MAXAMILLION, GESSERT ([id number]) [id number] Page 9 of 12 Cleanser: Wound Cleanser 1 x Per Week/30 Days Discharge Instructions: Cleanse the wound with wound cleanser prior to applying a clean dressing using gauze sponges, not tissue or cotton balls. Peri-Wound Care: Sween Lotion (Moisturizing lotion) 1 x Per Week/30 Days Discharge Instructions: Apply moisturizing lotion as directed Prim Dressing: Maxorb Extra Ag+ Alginate Dressing, 2x2 (in/in) 1 x Per Week/30 Days ary Discharge Instructions: Apply to wound bed as instructed Secondary Dressing: Woven Gauze Sponge, Non-Sterile 4x4 in 1 x Per Week/30 Days Discharge Instructions: Apply over primary dressing as directed. Com pression Wrap: Urgo K2, (equivalent to a 4 layer) two layer compression system, regular 1 x Per Week/30 Days Discharge Instructions: Apply Urgo K2 as directed (alternative to 4 layer compression). WOUND #5: - Lower Leg Wound Laterality: Left, Anterior Cleanser: Soap and Water 1 x Per Week/30 Days Discharge Instructions: May shower and wash wound with dial antibacterial soap and water prior to dressing change. Cleanser: Wound Cleanser 1 x Per Week/30 Days Discharge Instructions: Cleanse the wound with wound cleanser prior to applying a clean dressing using gauze sponges, not tissue or cotton balls. Peri-Wound Care:  Sween Lotion (Moisturizing lotion) 1 x Per Week/30 Days Discharge Instructions: Apply moisturizing lotion as directed Prim Dressing: Maxorb Extra Ag+ Alginate Dressing, 2x2 (in/in) 1 x Per Week/30 Days ary Discharge Instructions: Apply to wound bed as instructed Secondary Dressing: Woven Gauze Sponge, Non-Sterile 4x4 in 1 x Per Week/30 Days Discharge Instructions: Apply over primary dressing as directed. Com pression Wrap: Urgo K2, (equivalent to a 4 layer) two layer compression system, regular 1 x Per Week/30 Days Discharge Instructions: Apply Urgo K2 as directed (alternative to 4 layer compression). WOUND #6: - Lower Leg Wound Laterality: Left, Lateral Cleanser: Soap and Water 1 x Per Week/30 Days Discharge Instructions: May shower and wash wound with dial antibacterial soap and water prior to dressing change. Cleanser: Wound Cleanser 1 x Per Week/30 Days Discharge Instructions: Cleanse the wound with wound cleanser prior to applying a clean dressing using gauze sponges, not tissue or cotton balls. Peri-Wound  Care: Triamcinolone 15 (g) 1 x Per Week/30 Days Discharge Instructions: Use triamcinolone 15 (g) as directed Peri-Wound Care: Sween Lotion (Moisturizing lotion) 1 x Per Week/30 Days Discharge Instructions: Apply moisturizing lotion as directed Topical: Gentamicin 1 x Per Week/30 Days Discharge Instructions: As directed by physician Topical: Mupirocin Ointment 1 x Per Week/30 Days Discharge Instructions: Apply Mupirocin (Bactroban) as instructed Prim Dressing: Maxorb Extra Ag+ Alginate Dressing, 2x2 (in/in) 1 x Per Week/30 Days ary Discharge Instructions: Apply to wound bed as instructed Secondary Dressing: Woven Gauze Sponge, Non-Sterile 4x4 in 1 x Per Week/30 Days Discharge Instructions: Apply over primary dressing as directed. Com pression Wrap: Urgo K2, (equivalent to a 4 layer) two layer compression system, regular 1 x Per Week/30 Days Discharge Instructions: Apply Urgo K2 as  directed (alternative to 4 layer compression). WOUND #7: - Lower Leg Wound Laterality: Right, Lateral Cleanser: Soap and Water 1 x Per Week/30 Days Discharge Instructions: May shower and wash wound with dial antibacterial soap and water prior to dressing change. Cleanser: Wound Cleanser 1 x Per Week/30 Days Discharge Instructions: Cleanse the wound with wound cleanser prior to applying a clean dressing using gauze sponges, not tissue or cotton balls. Peri-Wound Care: Triamcinolone 15 (g) 1 x Per Week/30 Days Discharge Instructions: Use triamcinolone 15 (g) as directed Peri-Wound Care: Sween Lotion (Moisturizing lotion) 1 x Per Week/30 Days Discharge Instructions: Apply moisturizing lotion as directed Topical: Gentamicin 1 x Per Week/30 Days Discharge Instructions: As directed by physician Topical: Mupirocin Ointment 1 x Per Week/30 Days Discharge Instructions: Apply Mupirocin (Bactroban) as instructed Prim Dressing: Maxorb Extra Ag+ Alginate Dressing, 2x2 (in/in) 1 x Per Week/30 Days ary Discharge Instructions: Apply to wound bed as instructed Secondary Dressing: Woven Gauze Sponge, Non-Sterile 4x4 in 1 x Per Week/30 Days Discharge Instructions: Apply over primary dressing as directed. Com pression Wrap: Urgo K2, (equivalent to a 4 layer) two layer compression system, regular 1 x Per Week/30 Days Discharge Instructions: Apply Urgo K2 as directed (alternative to 4 layer compression). 04/04/2023: Despite being in bilateral 3 layer compression equivalent, he has developed new wounds. All of these seem to be secondary to blistering. He has a new wound on his right lateral lower leg just above the ankle and 2 new sites on his left anterolateral lower leg. These are fairly tender and a the skin is little bit erythematous around them. I used a curette to debride slough from the new wounds on his anterolateral left leg and the right lateral leg. I am going to add some gentamicin and mupirocin to the  new wounds on left, due to the erythema. We will use silver alginate on all of the open wounds. I am also going to increase his compression from 3 layer equivalent to 4-layer equivalent. Follow-up in 1 week. Electronic Signature(s) Signed: 04/04/2023 3:06:02 PM By: Duanne Guess MD FACS Entered By: Duanne Guess on 04/04/2023 12:06:02 [name] ([id number]) [id number] Page 10 of 12 -------------------------------------------------------------------------------- HxROS Details Patient Name: Date of Service: [name] 04/04/2023 2:15 PM Medical Record Number: [id number] Patient Account Number: [id number] Date of Birth/Sex: Treating RN: September 30, 1931 (89 y.o. M) Primary Care Provider: Alcide Clever Other Clinician: Referring Provider: Treating Provider/Extender: Duanne Guess [facility], MICHELLE Weeks in Treatment: 11 Information Obtained From Patient Chart Eyes Medical History: Positive for: Cataracts - bil extractions Negative for: Glaucoma; Optic Neuritis Ear/Nose/Mouth/Throat Medical History: Negative for: Chronic sinus problems/congestion; Middle ear problems Past Medical History Notes: seasonal allergies Cardiovascular Medical History: Positive for: Congestive Heart  Failure; Coronary Artery Disease; Hypertension; Peripheral Venous Disease Endocrine Medical History: Negative for: Type I Diabetes; Type II Diabetes Integumentary (Skin) Medical History: Negative for: History of Burn Musculoskeletal Medical History: Positive for: Osteoarthritis Past Medical History Notes: myositis, myalgia, left patella fracture, bursitis of hips, lumbar compression fx, spondylolistthesis Neurologic Medical History: Positive for: Neuropathy Past Medical History Notes: bil carpal tunnel Oncologic Medical History: Negative for: Received Chemotherapy; Received Radiation Past Medical History Notes: skin cancers Psychiatric Medical  History: Negative for: Anorexia/bulimia; Confinement Anxiety HBO Extended History Items Eyes: Cataracts Immunizations Pneumococcal Vaccine: Received Pneumococcal Vaccination: No Implantable Devices No devices added [name], SCHWINN ([id number]) [phone].pdf Page 11 of 12 Hospitalization / Surgery History Type of Hospitalization/Surgery bil carpal tunnel release cardiac cath kyphoplasty ORIF patella left left total knee arthroplasty posterior lumbar fusion lumbar laminectomy posterior cervical fusion appendectomy blepharoplasty cholecystectomy bil catract extraction right knee replacement melanoma excision rectal surgery fissure vitrectomy Family and Social History Cancer: No; Diabetes: No; Heart Disease: No; Hereditary Spherocytosis: No; Hypertension: No; Kidney Disease: No; Lung Disease: Yes - Siblings; Seizures: No; Stroke: Yes - Siblings; Thyroid Problems: No; Tuberculosis: No; Former smoker; Marital Status - Married; Alcohol Use: Moderate; Drug Use: No History; Caffeine Use: Rarely; Financial Concerns: No; Food, Clothing or Shelter Needs: No; Support System Lacking: No; Transportation Concerns: No Psychologist, prison and probation services) Signed: 04/04/2023 3:06:26 PM By: Duanne Guess MD FACS Entered By: Duanne Guess on 04/04/2023 12:02:56 -------------------------------------------------------------------------------- SuperBill Details Patient Name: Date of Service: [name], THARUN PUCK 04/04/2023 Medical Record Number: [id number] Patient Account Number: [id number] Date of Birth/Sex: Treating RN: 1932-06-14 (88 y.o. M) Primary Care Provider: Alcide Clever Other Clinician: Referring Provider: Treating Provider/Extender: Duanne Guess [facility], MICHELLE Weeks in Treatment: 11 Diagnosis Coding ICD-10 Codes Code Description [phone] Non-pressure chronic ulcer of other part of right lower leg with fat layer exposed L97.822 Non-pressure chronic  ulcer of other part of left lower leg with fat layer exposed L97.321 Non-pressure chronic ulcer of left ankle limited to breakdown of skin I87.2 Venous insufficiency (chronic) (peripheral) I50.9 Heart failure, unspecified R60.0 Localized edema I25.10 Atherosclerotic heart disease of native coronary artery without angina pectoris L84 Corns and callosities Facility Procedures : CPT4 Code: [id number] Description: 97597 - DEBRIDE WOUND 1ST 20 SQ CM OR < ICD-10 Diagnosis Description 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 L97.822 Non-pressure chronic ulcer of other part of left lower leg with fat layer  expose Modifier: ed d Quantity: 1 Physician Procedures : CPT4 Code Description Modifier [id number] 99214 - WC PHYS LEVEL 4 - EST PT 25 ICD-10 Diagnosis Description 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ed VITALY, JASMER E ([id number])  [phone] 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ed L97.822 Non-pressure chronic ulcer of other part of left lower leg with fat layer exposed L97.321 Non-pressure chronic ulcer of  left ankle limited to breakdown of skin I87.2 Venous insufficiency (chronic) (peripheral) Quantity: 1 27.pdf Page 12 of 12 : [id number] 97597 - WC PHYS DEBR WO ANESTH 20 SQ CM 1 ICD-10 Diagnosis Description 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ed L97.822 Non-pressure chronic ulcer of other part of left lower leg with fat layer  exposed Quantity: Electronic Signature(s) Signed: 04/04/2023 3:23:09 PM By: Duanne Guess MD FACS Entered By: Duanne Guess on 04/04/2023 12:23:09

## 2023-04-04 NOTE — Progress Notes (Signed)
[name] ([id number]) [id number] Page 1 of 14 Visit Report for 04/04/2023 Arrival Information Details Patient Name: Date of Service: [name] 04/04/2023 2:15 PM Medical Record Number: [id number] Patient Account Number: [id number] Date of Birth/Sex: Treating RN: [birth year] (89 y.o. Marlan Palau Primary Care Ervan Heber: Alcide Clever Other Clinician: Referring Shantay Sonn: Treating Mylin Gignac/Extender: Duanne Guess [facility], MICHELLE Weeks in Treatment: 11 Visit Information History Since Last Visit Added or deleted any medications: No Patient Arrived: Walker Any new allergies or adverse reactions: No Arrival Time: 14:15 Had a fall or experienced change in No Accompanied By: self activities of daily living that may affect Transfer Assistance: None risk of falls: Patient Identification Verified: Yes Signs or symptoms of abuse/neglect since last visito No Secondary Verification Process Completed: Yes Hospitalized since last visit: No Patient Requires Transmission-Based Precautions: No Implantable device outside of the clinic excluding No Patient Has Alerts: No cellular tissue based products placed in the center since last visit: Has Dressing in Place as Prescribed: Yes Has Compression in Place as Prescribed: Yes Pain Present Now: Yes Electronic Signature(s) Signed: 04/04/2023 3:57:51 PM By: Samuella Bruin Entered By: Samuella Bruin on 04/04/2023 11:16:34 -------------------------------------------------------------------------------- Compression Therapy Details Patient Name: Date of Service: [name], SHRIRAM JIN 04/04/2023 2:15 PM Medical Record Number: [id number] Patient Account Number: [id number] Date of Birth/Sex: Treating RN: 23-Feb-1932 (89 y.o. Marlan Palau Primary Care Ryen Heitmeyer: Alcide Clever Other Clinician: Referring Maxton Noreen: Treating Atom Solivan/Extender: Duanne Guess [facility], MICHELLE Weeks in  Treatment: 11 Compression Therapy Performed for Wound Assessment: Wound #4 Left,Medial Ankle Performed By: Clinician Samuella Bruin, RN Compression Type: Double Layer Post Procedure Diagnosis Same as Pre-procedure Electronic Signature(s) Signed: 04/04/2023 3:57:51 PM By: Samuella Bruin Entered By: Samuella Bruin on 04/04/2023 11:43:46 [name] ([id number]) [id number] Page 2 of 14 -------------------------------------------------------------------------------- Compression Therapy Details Patient Name: Date of Service: [name] 04/04/2023 2:15 PM Medical Record Number: [id number] Patient Account Number: [id number] Date of Birth/Sex: Treating RN: 06-Aug-1932 (89 y.o. Marlan Palau Primary Care [name]: Alcide Clever Other Clinician: Referring [name]: Treating [name]/Extender: Duanne Guess [facility], MICHELLE Weeks in Treatment: 11 Compression Therapy Performed for Wound Assessment: Wound #7 Right,Lateral Lower Leg Performed By: Clinician Samuella Bruin, RN Compression Type: Double Layer Post Procedure Diagnosis Same as Pre-procedure Electronic Signature(s) Signed: 04/04/2023 3:57:51 PM By: Gelene Mink By: Samuella Bruin on 04/04/2023 11:43:46 -------------------------------------------------------------------------------- Encounter Discharge Information Details Patient Name: Date of Service: [name], Jordan E. 04/04/2023 2:15 PM Medical Record Number: [id number] Patient Account Number: [id number] Date of Birth/Sex: Treating RN: Aug 12, 1932 (89 y.o. Marlan Palau Primary Care [name]: Alcide Clever Other Clinician: Referring [name]: Treating [name]/Extender: Duanne Guess [facility], MICHELLE Weeks in Treatment: 58 Encounter Discharge Information Items Post Procedure Vitals Discharge Condition: Stable Temperature (F): 98.4 Ambulatory Status: Walker Pulse (bpm):  83 Discharge Destination: Home Respiratory Rate (breaths/min): 18 Transportation: Private Auto Blood Pressure (mmHg): 132/63 Accompanied By: self Schedule Follow-up Appointment: Yes Clinical Summary of Care: Patient Declined Electronic Signature(s) Signed: 04/04/2023 3:57:51 PM By: Samuella Bruin Entered By: Samuella Bruin on 04/04/2023 12:53:53 -------------------------------------------------------------------------------- Lower Extremity Assessment Details Patient Name: Date of Service: [name], [name] 04/04/2023 2:15 PM Medical Record Number: [id number] Patient Account Number: [id number] Date of Birth/Sex: Treating RN: 1931/10/27 (89 y.o. Marlan Palau Primary Care [name]: Alcide Clever Other Clinician: Referring [name]: Treating Brianda Beitler/Extender: Duanne Guess [facility], MICHELLE Weeks in Treatment: 11 Edema Assessment Assessed: [Left: No] [Right: No] Edema: [Left: Yes] [Right: Yes] Calf BAER, SNAY E ([id number]) [id number] Page  3 of 14 Left: Right: Point of Measurement: From Medial Instep 44 cm 33.5 cm Ankle Left: Right: Point of Measurement: From Medial Instep 23.5 cm 23 cm Vascular Assessment Pulses: Dorsalis Pedis Palpable: [Left:Yes] [Right:Yes] Extremity colors, hair growth, and conditions: Extremity Color: [Left:Hyperpigmented] [Right:Hyperpigmented] Hair Growth on Extremity: [Left:No] [Right:No] Temperature of Extremity: [Left:Warm] [Right:Warm] Capillary Refill: [Left:< 3 seconds] [Right:< 3 seconds] Dependent Rubor: [Left:No No] [Right:No No] Electronic Signature(s) Signed: 04/04/2023 3:57:51 PM By: Samuella Bruin Entered By: Samuella Bruin on 04/04/2023 11:20:58 -------------------------------------------------------------------------------- Multi Wound Chart Details Patient Name: Date of Service: [name], Jordan E. 04/04/2023 2:15 PM Medical Record Number: [id number] Patient Account  Number: [id number] Date of Birth/Sex: Treating RN: 1931/09/08 (89 y.o. M) Primary Care Sora Vrooman: Alcide Clever Other Clinician: Referring Damire Remedios: Treating Jarika Robben/Extender: Duanne Guess [facility], MICHELLE Weeks in Treatment: 11 Vital Signs Height(in): 72 Pulse(bpm): 83 Weight(lbs): 198 Blood Pressure(mmHg): 132/63 Body Mass Index(BMI): 26.9 Temperature(F): 98.4 Respiratory Rate(breaths/min): 18 [2:Photos:] Right, Medial Ankle Left, Medial Ankle Left, Anterior Lower Leg Wound Location: Gradually Appeared Blister Gradually Appeared Wounding Event: Venous Leg Ulcer Venous Leg Ulcer Venous Leg Ulcer Primary Etiology: Cataracts, Congestive Heart Failure, Cataracts, Congestive Heart Failure, Cataracts, Congestive Heart Failure, Comorbid History: Coronary Artery Disease, Coronary Artery Disease, Coronary Artery Disease, Hypertension, Peripheral Venous Hypertension, Peripheral Venous Hypertension, Peripheral Venous Disease, Osteoarthritis, Neuropathy Disease, Osteoarthritis, Neuropathy Disease, Osteoarthritis, Neuropathy 02/12/2023 03/20/2023 03/27/2023 Date Acquired: 6 2 1  Weeks of Treatment: Open Open Open Wound Status: No No No Wound Recurrence: 0x0x0 0.7x1.5x0.1 0.9x1.1x0.1 Measurements L x W x D (cm) 0 0.825 0.778 A (cm) : rea 0 0.082 0.078 Volume (cm) : 100.00% 93.00% -995.80% % Reduction in Area: 100.00% 93.00% -1014.30% % Reduction in VolumeMARKEIS, CAYLOR E ([id number]) [id number] Page 4 of 14 Full Thickness Without Exposed Full Thickness Without Exposed Full Thickness Without Exposed Classification: Support Structures Support Structures Support Structures None Present Medium Medium Exudate A mount: N/A Serous Serosanguineous Exudate Type: N/A amber red, brown Exudate Color: Flat and Intact Distinct, outline attached Distinct, outline attached Wound Margin: None Present (0%) Large (67-100%) Large (67-100%) Granulation A  mount: N/A Red Red Granulation Quality: None Present (0%) Small (1-33%) Small (1-33%) Necrotic A mount: Fascia: No Fat Layer (Subcutaneous Tissue): Yes Fat Layer (Subcutaneous Tissue): Yes Exposed Structures: Fat Layer (Subcutaneous Tissue): No Fascia: No Fascia: No Tendon: No Tendon: No Tendon: No Muscle: No Muscle: No Muscle: No Joint: No Joint: No Joint: No Bone: No Bone: No Bone: No Large (67-100%) Medium (34-66%) Small (1-33%) Epithelialization: N/A N/A N/A Debridement: N/A N/A N/A Pain Control: N/A N/A N/A Tissue Debrided: N/A N/A N/A Level: N/A N/A N/A Debridement A (sq cm): rea N/A N/A N/A Instrument: N/A N/A N/A Bleeding: N/A N/A N/A Hemostasis A chieved: Debridement Treatment Response: N/A N/A N/A Post Debridement Measurements L x N/A N/A N/A W x D (cm) N/A N/A N/A Post Debridement Volume: (cm) No Abnormalities Noted No Abnormalities Noted No Abnormalities Noted Periwound Skin Texture: Dry/Scaly: Yes No Abnormalities Noted No Abnormalities Noted Periwound Skin Moisture: Hemosiderin Staining: Yes Hemosiderin Staining: Yes Hemosiderin Staining: Yes Periwound Skin Color: No Abnormality No Abnormality No Abnormality Temperature: N/A Compression Therapy N/A Procedures Performed: Wound Number: 6 7 N/A Photos: N/A Left, Lateral Lower Leg Right, Lateral Lower Leg N/A Wound Location: Blister Blister N/A Wounding Event: Venous Leg Ulcer Venous Leg Ulcer N/A Primary Etiology: Cataracts, Congestive Heart Failure, Cataracts, Congestive Heart Failure, N/A Comorbid History: Coronary Artery Disease, Coronary Artery Disease, Hypertension, Peripheral Venous Hypertension, Peripheral Venous Disease, Osteoarthritis, Neuropathy  Disease, Osteoarthritis, Neuropathy 04/04/2023 04/04/2023 N/A Date Acquired: 0 0 N/A Weeks of Treatment: Open Open N/A Wound Status: No No N/A Wound Recurrence: 5x3.5x0.1 1x0.9x0.1 N/A Measurements L x W x D (cm) 13.744 0.707  N/A A (cm) : rea 1.374 0.071 N/A Volume (cm) : N/A N/A N/A % Reduction in A rea: N/A N/A N/A % Reduction in Volume: Full Thickness Without Exposed Full Thickness Without Exposed N/A Classification: Support Structures Support Structures Medium Medium N/A Exudate A mount: Serous Purulent N/A Exudate Type: amber yellow, brown, green N/A Exudate Color: Distinct, outline attached Distinct, outline attached N/A Wound Margin: Large (67-100%) Small (1-33%) N/A Granulation A mount: Red Red, Pink N/A Granulation Quality: Small (1-33%) Large (67-100%) N/A Necrotic A mount: Fat Layer (Subcutaneous Tissue): Yes Fat Layer (Subcutaneous Tissue): Yes N/A Exposed Structures: Fascia: No Fascia: No Tendon: No Tendon: No Muscle: No Muscle: No Joint: No Joint: No Bone: No Bone: No None None N/A Epithelialization: N/A Debridement - Selective/Open Wound N/A Debridement: Pre-procedure Verification/Time Out N/A 14:44 N/A Taken: N/A Lidocaine 4% Topical Solution N/A Pain Control: N/A Slough N/A Tissue Debrided: N/A Skin/Epidermis N/A Level: N/A 0.71 N/A Debridement A (sq cm): rea N/A Curette N/A Instrument: N/A Minimum N/A Bleeding: MALIKAI, BARTOL ([id number]) [id number] Page 5 of 14 N/A Pressure N/A Hemostasis A chieved: N/A Procedure was tolerated well N/A Debridement Treatment Response: N/A 1x0.9x0.1 N/A Post Debridement Measurements L x W x D (cm) N/A 0.071 N/A Post Debridement Volume: (cm) No Abnormalities Noted No Abnormalities Noted N/A Periwound Skin Texture: Dry/Scaly: Yes No Abnormalities Noted N/A Periwound Skin Moisture: Hemosiderin Staining: Yes Hemosiderin Staining: Yes N/A Periwound Skin Color: No Abnormality No Abnormality N/A Temperature: N/A Compression Therapy N/A Procedures Performed: Debridement Treatment Notes Electronic Signature(s) Signed: 04/04/2023 2:58:33 PM By: Duanne Guess MD FACS Entered By: Duanne Guess on 04/04/2023 11:58:33 -------------------------------------------------------------------------------- Multi-Disciplinary Care Plan Details Patient Name: Date of Service: [name], Suliman E. 04/04/2023 2:15 PM Medical Record Number: [id number] Patient Account Number: [id number] Date of Birth/Sex: Treating RN: 1932-04-28 (89 y.o. Marlan Palau Primary Care Jasira Robinson: Alcide Clever Other Clinician: Referring Dulcie Gammon: Treating Kahne Helfand/Extender: Duanne Guess [facility], MICHELLE Weeks in Treatment: 11 Multidisciplinary Care Plan reviewed with physician Active Inactive Venous Leg Ulcer Nursing Diagnoses: Knowledge deficit related to disease process and management Potential for venous Insuffiency (use before diagnosis confirmed) Goals: Patient will maintain optimal edema control Date Initiated: 01/11/2023 Target Resolution Date: 05/10/2023 Goal Status: Active Interventions: Assess peripheral edema status every visit. Compression as ordered Provide education on venous insufficiency Treatment Activities: Therapeutic compression applied : 01/11/2023 Notes: Wound/Skin Impairment Nursing Diagnoses: Impaired tissue integrity Knowledge deficit related to ulceration/compromised skin integrity Goals: Patient/caregiver will verbalize understanding of skin care regimen Date Initiated: 01/11/2023 Target Resolution Date: 05/10/2023 Goal Status: Active Ulcer/skin breakdown will have a volume reduction of 30% by week 4 Date Initiated: 01/11/2023 Date Inactivated: 02/07/2023 Target Resolution Date: 02/08/2023 Goal Status: Met Interventions: SCOTTIE, SERAPHIN ([id number]) [id number] Page 6 of 14 Assess patient/caregiver ability to obtain necessary supplies Assess patient/caregiver ability to perform ulcer/skin care regimen upon admission and as needed Assess ulceration(s) every visit Provide education on ulcer and skin care Treatment  Activities: Skin care regimen initiated : 01/11/2023 Topical wound management initiated : 01/11/2023 Notes: Electronic Signature(s) Signed: 04/04/2023 3:57:51 PM By: Samuella Bruin Entered By: Samuella Bruin on 04/04/2023 11:33:34 -------------------------------------------------------------------------------- Pain Assessment Details Patient Name: Date of Service: [name], KOOPER REICHELDERFER 04/04/2023 2:15 PM Medical Record Number: [id number] Patient Account Number: [id number] Date of Birth/Sex:  Treating RN: [birth year] (89 y.o. Marlan Palau Primary Care Candita Borenstein: Alcide Clever Other Clinician: Referring Collyn Selk: Treating Daiveon Markman/Extender: Duanne Guess [facility], MICHELLE Weeks in Treatment: 74 Active Problems Location of Pain Severity and Description of Pain Patient Has Paino Yes Site Locations Pain Location: Pain in Ulcers Duration of the Pain. Constant / Intermittento Constant Rate the pain. Current Pain Level: 6 Character of Pain Describe the Pain: Burning Pain Management and Medication Current Pain Management: Medication: Yes Electronic Signature(s) Signed: 04/04/2023 3:57:51 PM By: Samuella Bruin Entered By: Samuella Bruin on 04/04/2023 11:17:19 [name] ([id number]) [id number] Page 7 of 14 -------------------------------------------------------------------------------- Patient/Caregiver Education Details Patient Name: Date of Service: Betterton [name] 8/22/2024andnbsp2:15 PM Medical Record Number: [id number] Patient Account Number: [id number] Date of Birth/Gender: Treating RN: [birth year] (89 y.o. Marlan Palau Primary Care Physician: Alcide Clever Other Clinician: Referring Physician: Treating Physician/Extender: Duanne Guess [facility], MICHELLE Weeks in Treatment: 11 Education Assessment Education Provided To: Patient Education Topics Provided Wound/Skin Impairment: Methods:  Explain/Verbal Responses: Reinforcements needed, State content correctly Electronic Signature(s) Signed: 04/04/2023 3:57:51 PM By: Samuella Bruin Entered By: Samuella Bruin on 04/04/2023 11:42:14 -------------------------------------------------------------------------------- Wound Assessment Details Patient Name: Date of Service: [name], TYQUAN EILERS 04/04/2023 2:15 PM Medical Record Number: [id number] Patient Account Number: [id number] Date of Birth/Sex: Treating RN: 1932/06/23 (89 y.o. Marlan Palau Primary Care Chip Canepa: Alcide Clever Other Clinician: Referring Deanndra Kirley: Treating Santosh Petter/Extender: Duanne Guess [facility], MICHELLE Weeks in Treatment: 11 Wound Status Wound Number: 2 Primary Venous Leg Ulcer Etiology: Wound Location: Right, Medial Ankle Wound Open Wounding Event: Gradually Appeared Status: Date Acquired: 02/12/2023 Comorbid Cataracts, Congestive Heart Failure, Coronary Artery Disease, Weeks Of Treatment: 6 History: Hypertension, Peripheral Venous Disease, Osteoarthritis, Clustered Wound: No Neuropathy Photos Wound Measurements Length: (cm) Width: (cm) Depth: (cm) Area: (cm) Volume: (cm) 0 % Reduction in Area: 100% 0 % Reduction in Volume: 100% 0 Epithelialization: Large (67-100%) 0 Tunneling: No 0 Undermining: No Wound Description KENYUN, LUMLEY E ([id number]) Classification: Full Thickness Without Exposed Support Structures Wound Margin: Flat and Intact Exudate Amount: None Present [id number] Page 8 of 14 Foul Odor After Cleansing: No Slough/Fibrino No Wound Bed Granulation Amount: None Present (0%) Exposed Structure Necrotic Amount: None Present (0%) Fascia Exposed: No Fat Layer (Subcutaneous Tissue) Exposed: No Tendon Exposed: No Muscle Exposed: No Joint Exposed: No Bone Exposed: No Periwound Skin Texture Texture Color No Abnormalities Noted: Yes No Abnormalities Noted: No Hemosiderin  Staining: Yes Moisture No Abnormalities Noted: No Temperature / Pain Dry / Scaly: Yes Temperature: No Abnormality Electronic Signature(s) Signed: 04/04/2023 3:57:51 PM By: Samuella Bruin Entered By: Samuella Bruin on 04/04/2023 11:29:58 -------------------------------------------------------------------------------- Wound Assessment Details Patient Name: Date of Service: [name], DHEERAN BORTH 04/04/2023 2:15 PM Medical Record Number: [id number] Patient Account Number: [id number] Date of Birth/Sex: Treating RN: 02/24/1932 (89 y.o. Marlan Palau Primary Care Khori Underberg: Alcide Clever Other Clinician: Referring Sherly Brodbeck: Treating Elenna Spratling/Extender: Duanne Guess [facility], MICHELLE Weeks in Treatment: 11 Wound Status Wound Number: 4 Primary Venous Leg Ulcer Etiology: Wound Location: Left, Medial Ankle Wound Open Wounding Event: Blister Status: Date Acquired: 03/20/2023 Comorbid Cataracts, Congestive Heart Failure, Coronary Artery Disease, Weeks Of Treatment: 2 History: Hypertension, Peripheral Venous Disease, Osteoarthritis, Clustered Wound: No Neuropathy Photos Wound Measurements Length: (cm) 0.7 Width: (cm) 1.5 Depth: (cm) 0.1 Area: (cm) 0.825 Volume: (cm) 0.082 % Reduction in Area: 93% % Reduction in Volume: 93% Epithelialization: Medium (34-66%) Tunneling: No Undermining: No Wound Description Classification: Full Thickness Without Exposed Support Structures PRAKASH, MARKUM E ([id number]) Wound Margin: Distinct,  outline attached Exudate Amount: Medium Exudate Type: Serous Exudate Color: amber Foul Odor After Cleansing: No [id number] Page 9 of 14 Slough/Fibrino Yes Wound Bed Granulation Amount: Large (67-100%) Exposed Structure Granulation Quality: Red Fascia Exposed: No Necrotic Amount: Small (1-33%) Fat Layer (Subcutaneous Tissue) Exposed: Yes Tendon Exposed: No Muscle Exposed: No Joint Exposed: No Bone Exposed:  No Periwound Skin Texture Texture Color No Abnormalities Noted: Yes No Abnormalities Noted: No Hemosiderin Staining: Yes Moisture No Abnormalities Noted: Yes Temperature / Pain Temperature: No Abnormality Treatment Notes Wound #4 (Ankle) Wound Laterality: Left, Medial Cleanser Soap and Water Discharge Instruction: May shower and wash wound with dial antibacterial soap and water prior to dressing change. Wound Cleanser Discharge Instruction: Cleanse the wound with wound cleanser prior to applying a clean dressing using gauze sponges, not tissue or cotton balls. Peri-Wound Care Sween Lotion (Moisturizing lotion) Discharge Instruction: Apply moisturizing lotion as directed Topical Primary Dressing Maxorb Extra Ag+ Alginate Dressing, 2x2 (in/in) Discharge Instruction: Apply to wound bed as instructed Secondary Dressing Woven Gauze Sponge, Non-Sterile 4x4 in Discharge Instruction: Apply over primary dressing as directed. Secured With Compression Wrap Urgo K2, (equivalent to a 4 layer) two layer compression system, regular Discharge Instruction: Apply Urgo K2 as directed (alternative to 4 layer compression). Compression Stockings Add-Ons Electronic Signature(s) Signed: 04/04/2023 3:57:51 PM By: Samuella Bruin Entered By: Samuella Bruin on 04/04/2023 11:30:17 -------------------------------------------------------------------------------- Wound Assessment Details Patient Name: Date of Service: [name], [name] 04/04/2023 2:15 PM Medical Record Number: [id number] Patient Account Number: [id number] Date of Birth/Sex: Treating RN: [birth year] (89 y.o. Marlan Palau Primary Care Celestial Barnfield: Alcide Clever Other Clinician: Referring Knoah Nedeau: Treating Abella Shugart/Extender: Duanne Guess [facility], MICHELLE Weeks in Treatment: [address] [place] E ([id number]) [id number] Page 10 of 14 Wound Status Wound Number: 5 Primary Venous Leg  Ulcer Etiology: Wound Location: Left, Anterior Lower Leg Wound Open Wounding Event: Gradually Appeared Status: Date Acquired: 03/27/2023 Comorbid Cataracts, Congestive Heart Failure, Coronary Artery Disease, Weeks Of Treatment: 1 History: Hypertension, Peripheral Venous Disease, Osteoarthritis, Clustered Wound: No Neuropathy Photos Wound Measurements Length: (cm) 0.9 Width: (cm) 1.1 Depth: (cm) 0.1 Area: (cm) 0.778 Volume: (cm) 0.078 % Reduction in Area: -995.8% % Reduction in Volume: -1014.3% Epithelialization: Small (1-33%) Tunneling: No Undermining: No Wound Description Classification: Full Thickness Without Exposed Support Structures Wound Margin: Distinct, outline attached Exudate Amount: Medium Exudate Type: Serosanguineous Exudate Color: red, brown Foul Odor After Cleansing: No Slough/Fibrino Yes Wound Bed Granulation Amount: Large (67-100%) Exposed Structure Granulation Quality: Red Fascia Exposed: No Necrotic Amount: Small (1-33%) Fat Layer (Subcutaneous Tissue) Exposed: Yes Necrotic Quality: Adherent Slough Tendon Exposed: No Muscle Exposed: No Joint Exposed: No Bone Exposed: No Periwound Skin Texture Texture Color No Abnormalities Noted: Yes No Abnormalities Noted: No Hemosiderin Staining: Yes Moisture No Abnormalities Noted: Yes Temperature / Pain Temperature: No Abnormality Treatment Notes Wound #5 (Lower Leg) Wound Laterality: Left, Anterior Cleanser Soap and Water Discharge Instruction: May shower and wash wound with dial antibacterial soap and water prior to dressing change. Wound Cleanser Discharge Instruction: Cleanse the wound with wound cleanser prior to applying a clean dressing using gauze sponges, not tissue or cotton balls. Peri-Wound Care Sween Lotion (Moisturizing lotion) Discharge Instruction: Apply moisturizing lotion as directed Topical Primary Dressing Maxorb Extra Ag+ Alginate Dressing, 2x2 (in/in) Discharge Instruction:  Apply to wound bed as instructed VALERIAN, RILL ([id number]) [id number] Page 11 of 14 Secondary Dressing Woven Gauze Sponge, Non-Sterile 4x4 in Discharge Instruction: Apply over primary dressing as directed. Secured With Compression Wrap Urgo K2, (equivalent  to a 4 layer) two layer compression system, regular Discharge Instruction: Apply Urgo K2 as directed (alternative to 4 layer compression). Compression Stockings Add-Ons Electronic Signature(s) Signed: 04/04/2023 3:57:51 PM By: Samuella Bruin Entered By: Samuella Bruin on 04/04/2023 11:31:04 -------------------------------------------------------------------------------- Wound Assessment Details Patient Name: Date of Service: [name], DAVIS [name] 04/04/2023 2:15 PM Medical Record Number: [id number] Patient Account Number: [id number] Date of Birth/Sex: Treating RN: 1932/03/02 (89 y.o. Marlan Palau Primary Care Emaya Preston: Alcide Clever Other Clinician: Referring Mayeli Bornhorst: Treating Blessed Cotham/Extender: Duanne Guess [facility], MICHELLE Weeks in Treatment: 11 Wound Status Wound Number: 6 Primary Venous Leg Ulcer Etiology: Wound Location: Left, Lateral Lower Leg Wound Open Wounding Event: Blister Status: Date Acquired: 04/04/2023 Comorbid Cataracts, Congestive Heart Failure, Coronary Artery Disease, Weeks Of Treatment: 0 History: Hypertension, Peripheral Venous Disease, Osteoarthritis, Clustered Wound: No Neuropathy Photos Wound Measurements Length: (cm) 5 Width: (cm) 3.5 Depth: (cm) 0.1 Area: (cm) 13.744 Volume: (cm) 1.374 % Reduction in Area: % Reduction in Volume: Epithelialization: None Tunneling: No Undermining: No Wound Description Classification: Full Thickness Without Exposed Suppor Wound Margin: Distinct, outline attached Exudate Amount: Medium Exudate Type: Serous Exudate Color: amber t Structures Foul Odor After Cleansing: No Slough/Fibrino Yes Wound  Bed Granulation Amount: Large (67-100%) Exposed SUSHANTH, DIGANGI E ([id number]) [id number] Page 12 of 14 Granulation Quality: Red Fascia Exposed: No Necrotic Amount: Small (1-33%) Fat Layer (Subcutaneous Tissue) Exposed: Yes Necrotic Quality: Adherent Slough Tendon Exposed: No Muscle Exposed: No Joint Exposed: No Bone Exposed: No Periwound Skin Texture Texture Color No Abnormalities Noted: Yes No Abnormalities Noted: No Hemosiderin Staining: Yes Moisture No Abnormalities Noted: No Temperature / Pain Dry / Scaly: Yes Temperature: No Abnormality Treatment Notes Wound #6 (Lower Leg) Wound Laterality: Left, Lateral Cleanser Soap and Water Discharge Instruction: May shower and wash wound with dial antibacterial soap and water prior to dressing change. Wound Cleanser Discharge Instruction: Cleanse the wound with wound cleanser prior to applying a clean dressing using gauze sponges, not tissue or cotton balls. Peri-Wound Care Triamcinolone 15 (g) Discharge Instruction: Use triamcinolone 15 (g) as directed Sween Lotion (Moisturizing lotion) Discharge Instruction: Apply moisturizing lotion as directed Topical Gentamicin Discharge Instruction: As directed by physician Mupirocin Ointment Discharge Instruction: Apply Mupirocin (Bactroban) as instructed Primary Dressing Maxorb Extra Ag+ Alginate Dressing, 2x2 (in/in) Discharge Instruction: Apply to wound bed as instructed Secondary Dressing Woven Gauze Sponge, Non-Sterile 4x4 in Discharge Instruction: Apply over primary dressing as directed. Secured With Compression Wrap Urgo K2, (equivalent to a 4 layer) two layer compression system, regular Discharge Instruction: Apply Urgo K2 as directed (alternative to 4 layer compression). Compression Stockings Add-Ons Electronic Signature(s) Signed: 04/04/2023 3:57:51 PM By: Samuella Bruin Entered By: Samuella Bruin on 04/04/2023  11:31:41 -------------------------------------------------------------------------------- Wound Assessment Details Patient Name: Date of Service: [name], [name] 04/04/2023 2:15 PM Medical Record Number: [id number] Patient Account Number: [id number] Date of Birth/Sex: Treating RN: 1932/02/28 (89 y.o. Marlan Palau Primary Care Yamina Lenis: Alcide Clever Other Clinician: Referring Latesa Fratto: Treating Hansini Clodfelter/Extender: Roque Cash F[place], [name] ([id number]) [id number] Page 13 of 14 Weeks in Treatment: 11 Wound Status Wound Number: 7 Primary Venous Leg Ulcer Etiology: Wound Location: Right, Lateral Lower Leg Wound Open Wounding Event: Blister Status: Date Acquired: 04/04/2023 Comorbid Cataracts, Congestive Heart Failure, Coronary Artery Disease, Weeks Of Treatment: 0 History: Hypertension, Peripheral Venous Disease, Osteoarthritis, Clustered Wound: No Neuropathy Photos Wound Measurements Length: (cm) 1 Width: (cm) 0.9 Depth: (cm) 0.1 Area: (cm) 0.707 Volume: (cm) 0.071 % Reduction in Area: % Reduction in Volume:  Epithelialization: None Tunneling: No Undermining: No Wound Description Classification: Full Thickness Without Exposed Support Structures Wound Margin: Distinct, outline attached Exudate Amount: Medium Exudate Type: Purulent Exudate Color: yellow, brown, green Foul Odor After Cleansing: No Slough/Fibrino Yes Wound Bed Granulation Amount: Small (1-33%) Exposed Structure Granulation Quality: Red, Pink Fascia Exposed: No Necrotic Amount: Large (67-100%) Fat Layer (Subcutaneous Tissue) Exposed: Yes Necrotic Quality: Adherent Slough Tendon Exposed: No Muscle Exposed: No Joint Exposed: No Bone Exposed: No Periwound Skin Texture Texture Color No Abnormalities Noted: Yes No Abnormalities Noted: No Hemosiderin Staining: Yes Moisture No Abnormalities Noted: Yes Temperature / Pain Temperature: No  Abnormality Treatment Notes Wound #7 (Lower Leg) Wound Laterality: Right, Lateral Cleanser Soap and Water Discharge Instruction: May shower and wash wound with dial antibacterial soap and water prior to dressing change. Wound Cleanser Discharge Instruction: Cleanse the wound with wound cleanser prior to applying a clean dressing using gauze sponges, not tissue or cotton balls. Peri-Wound Care Triamcinolone 15 (g) Discharge Instruction: Use triamcinolone 15 (g) as directed Sween Lotion (Moisturizing lotion) Discharge Instruction: Apply moisturizing lotion as directed Topical KAILAND, HARTFIELD E ([id number]) [id number] Page 14 of 14 Gentamicin Discharge Instruction: As directed by physician Mupirocin Ointment Discharge Instruction: Apply Mupirocin (Bactroban) as instructed Primary Dressing Maxorb Extra Ag+ Alginate Dressing, 2x2 (in/in) Discharge Instruction: Apply to wound bed as instructed Secondary Dressing Woven Gauze Sponge, Non-Sterile 4x4 in Discharge Instruction: Apply over primary dressing as directed. Secured With Compression Wrap Urgo K2, (equivalent to a 4 layer) two layer compression system, regular Discharge Instruction: Apply Urgo K2 as directed (alternative to 4 layer compression). Compression Stockings Add-Ons Electronic Signature(s) Signed: 04/04/2023 3:57:51 PM By: Samuella Bruin Entered By: Samuella Bruin on 04/04/2023 11:32:00 -------------------------------------------------------------------------------- Vitals Details Patient Name: Date of Service: [name], Algenis E. 04/04/2023 2:15 PM Medical Record Number: [id number] Patient Account Number: [id number] Date of Birth/Sex: Treating RN: [birth year] (89 y.o. Marlan Palau Primary Care Jaskiran Pata: Alcide Clever Other Clinician: Referring Caeleigh Prohaska: Treating Raeshawn Tafolla/Extender: Duanne Guess [facility], MICHELLE Weeks in Treatment: 11 Vital Signs Time Taken:  14:16 Temperature (F): 98.4 Height (in): 72 Pulse (bpm): 83 Weight (lbs): 198 Respiratory Rate (breaths/min): 18 Body Mass Index (BMI): 26.9 Blood Pressure (mmHg): 132/63 Reference Range: 80 - 120 mg / dl Electronic Signature(s) Signed: 04/04/2023 3:57:51 PM By: Samuella Bruin Entered By: Samuella Bruin on 04/04/2023 11:16:50

## 2023-04-11 ENCOUNTER — Ambulatory Visit (HOSPITAL_BASED_OUTPATIENT_CLINIC_OR_DEPARTMENT_OTHER): Payer: Medicare Other | Admitting: General Surgery

## 2023-04-17 ENCOUNTER — Ambulatory Visit: Payer: Medicare Other | Admitting: Gastroenterology

## 2023-04-17 ENCOUNTER — Encounter: Payer: Self-pay | Admitting: Gastroenterology

## 2023-04-17 VITALS — BP 90/56 | HR 67 | Ht 72.0 in | Wt 195.8 lb

## 2023-04-17 DIAGNOSIS — I2510 Atherosclerotic heart disease of native coronary artery without angina pectoris: Secondary | ICD-10-CM

## 2023-04-17 DIAGNOSIS — Z86711 Personal history of pulmonary embolism: Secondary | ICD-10-CM

## 2023-04-17 DIAGNOSIS — R1310 Dysphagia, unspecified: Secondary | ICD-10-CM | POA: Diagnosis not present

## 2023-04-17 NOTE — Patient Instructions (Signed)
You have been scheduled for a Barium Esophogram at [facility] (1st floor of the hospital) on 05/01/23 at 9am. Please arrive 30 minutes prior to your appointment for registration. Make certain not to have anything to eat or drink 3 hours prior to your test. If you need to reschedule for any reason, please contact radiology at [phone] to do so.  Please follow up with your cardiologist. __________________________________________________________________ A barium swallow is an examination that concentrates on views of the esophagus. This tends to be a double contrast exam (barium and two liquids which, when combined, create a gas to distend the wall of the oesophagus) or single contrast (non-ionic iodine based). The study is usually tailored to your symptoms so a good history is essential. Attention is paid during the study to the form, structure and configuration of the esophagus, looking for functional disorders (such as aspiration, dysphagia, achalasia, motility and reflux) EXAMINATION You may be asked to change into a gown, depending on the type of swallow being performed. A radiologist and radiographer will perform the procedure. The radiologist will advise you of the type of contrast selected for your procedure and direct you during the exam. You will be asked to stand, sit or lie in several different positions and to hold a small amount of fluid in your mouth before being asked to swallow while the imaging is performed .In some instances you may be asked to swallow barium coated marshmallows to assess the motility of a solid food bolus. The exam can be recorded as a digital or video fluoroscopy procedure. POST PROCEDURE It will take 1-2 days for the barium to pass through your system. To facilitate this, it is important, unless otherwise directed, to increase your fluids for the next 24-48hrs and to resume your normal diet.  This test typically takes about 30 minutes to  perform. __________________________________________________________________________________   If your blood pressure at your visit was 140/90 or greater, please contact your primary care physician to follow up on this.  _______________________________________________________  If you are age 87 or older, your body mass index should be between 23-30. Your Body mass index is 26.56 kg/m. If this is out of the aforementioned range listed, please consider follow up with your Primary Care Provider.  If you are age 77 or younger, your body mass index should be between 19-25. Your Body mass index is 26.56 kg/m. If this is out of the aformentioned range listed, please consider follow up with your Primary Care Provider.   ________________________________________________________  The [place] [facility] providers would like to encourage you to use [facility] to communicate with providers for non-urgent requests or questions.  Due to long hold times on the telephone, sending your provider a message by [facility] may be a faster and more efficient way to get a response.  Please allow 48 business hours for a response.  Please remember that this is for non-urgent requests.   It was a pleasure to see you today!  Thank you for trusting me with your gastrointestinal care!    Scott E.Tomasa Rand, MD

## 2023-04-17 NOTE — Progress Notes (Signed)
HPI : [name] is a 89 y.o. male with a history of chronic pain/neuropathy, venous insufficiency, history of DVT who is referred to [place] by Nona Dell, NP for further evaluation/management of dysphagia.  Patient is a difficult historian, but it seems he has been having issues with his swallowing for a long time.  He cites taking care of his bedridden wife as a reason why he has delayed seeking evaluation of his swallowing issues.  He says he feels like food sits in his chest most every time he eats.  This sensation is improved with drinking water and he says he has to have something to drink anytime he eats.  He denies any episodes of having to forcefully vomit/regurgitate food that won't go down. He denies any foods causing more problems than others.  No liquid dysphagia.  When he swallows pills, he feels like it gets stuck 'in a holler' and points to a place in his throat above his thyroid cartilage. His weight is stable. He used to have problems with heartburn and acid reflux, but this hasn't been an issue for him in a long time.  He has undergone EGD with dilation in the past because of dysphagia, twice in 2016 by Dr. Arlyce Dice.  He was dilated with a 54 Fr Maloney and then a 20 mm balloon.  He has had trouble with ambulation and gait steadiness due to neuropathy.  He used to have frequent falls.  He uses a walker now.  This has been a chronic problem for years and has not progressed.  He was diagnosed with a pulmonary embolism in November 2023 and was started on Eliquis.  TTE showed an EF of 40% at that time. He was admitted to the hospital in December with chest pain and hypertensive urgency and underwent a cardiac catheterization which showed noncritical multivessel disease not requiring intervention and medical management was recommended.  He was recommended to follow up with Dr. Algie Coffer in 1 month. He recently underwent a carpal tunnel release in May of this year, and it appears  cardiac clearance was requested but I do not see any documentation from cardiology since his Dec 2023 hospitalization. He still takes Eliquis following his pulmonary embolism.  Unclear if he is planned to be on indefinite anticoagulation.   EGD Dec 21, 2014 (Dr. Arlyce Dice) ENDOSCOPIC IMPRESSION:  1. There was a stricture at the gastroesophageal junction; The stricture was dilated using a 18mm (54Fr) Maloney dilator  2. There was LA Class B esophagitis noted  3. EGD was otherwise normal    EGD Sept 23, 2016 (Dr. Arlyce Dice) ENDOSCOPIC IMPRESSION:  1. There was a stricture in the distal esophagus; Using a TTS-balloon the stricture was dilated up to 20mm  2. EGD was otherwise normal     Past Medical History:  Diagnosis Date   ACTINIC KERATOSIS, HEAD 03/23/2008   ALLERGIC RHINITIS 03/27/2007   Arthritis    BENIGN PROSTATIC HYPERTROPHY, WITH URINARY OBSTRUCTION 10/16/2007   CAD 08/18/2007   "patient denies any issues with heart, does not see cardiologist"   Carpal tunnel syndrome, bilateral 09/04/2016   CARPAL TUNNEL SYNDROME, LEFT 04/13/2008   CHF (congestive heart failure) (HCC)    Chronic back pain    Chronic neck pain    Complication of anesthesia    left lower jaw teeth have been injured in the past and he has lost those, now has 2 loose front teeth due to a mouth injury on 01/02/20   DVT (deep  venous thrombosis) (HCC)    ERECTILE DYSFUNCTION, ORGANIC 02/20/2010   Gout, unspecified 02/20/2010   Headache(784.0)    migraines hx of- started at age 67- none since 51   History of melanoma    HYPERLIPIDEMIA, WITH HIGH HDL 10/18/2008   HYPERTENSION 05/09/2007   sees Dr. 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   Patellar tendinitis 10/10/2009   Sciatic nerve pain    on Oxycodone as needed   SKIN CANCER, HX OF 03/27/2007   UNS ADVRS EFF UNS RX MEDICINAL&BIOLOGICAL  SBSTNC 08/18/2007   Urinary urgency      Past Surgical History:  Procedure Laterality Date   APPENDECTOMY  1969   APPLICATION OF WOUND VAC Left 01/19/2020   Procedure: Application Of Wound Vac;  Surgeon: Cammy Copa, MD;  Location: [facility] OR;  Service: Orthopedics;  Laterality: Left;   BLEPHAROPLASTY     CARPAL TUNNEL RELEASE Right    CARPAL TUNNEL RELEASE Left 12/27/2022   Procedure: CARPAL TUNNEL RELEASE WITH GANGLION CYST EXCISION;  Surgeon: Cammy Copa, MD;  Location[facility] OR;  Service: Orthopedics;  Laterality: Left;   CHOLECYSTECTOMY     ESOPHAGOGASTRODUODENOSCOPY (EGD) WITH PROPOFOL N/A 04/26/2015   Procedure: ESOPHAGOGASTRODUODENOSCOPY (EGD) WITH PROPOFOL;  Surgeon: Louis Meckel, MD;  Location: [facility] ENDOSCOPY;  Service: Endoscopy;  Laterality: N/A;   EYE SURGERY     bilateral cataracts removed and vitrectomies   JOINT REPLACEMENT Right    knee   KNEE ARTHROSCOPY     KYPHOPLASTY N/A 06/10/2020   Procedure: Thoracic Twelve, Lumbar One, Lumbar Two Kyphoplasty;  Surgeon: Coletta Memos, MD;  Location: [facility] OR;  Service: Neurosurgery;  Laterality: N/A;  3C/RM 21   KYPHOPLASTY N/A 09/07/2020   Procedure: Lumbar three KYPHOPLASTY;  Surgeon: Coletta Memos, MD;  Location: [facility] OR;  Service: Neurosurgery;  Laterality: N/A;   LAMINECTOMY     LATERAL FUSION LUMBAR SPINE, TRANSVERSE     LEFT HEART CATH AND CORONARY ANGIOGRAPHY N/A 07/27/2022   Procedure: LEFT HEART CATH AND CORONARY ANGIOGRAPHY;  Surgeon: Orpah Cobb, MD;  Location: [facility] INVASIVE CV LAB;  Service: Cardiovascular;  Laterality: N/A;   LUMBAR LAMINECTOMY/DECOMPRESSION MICRODISCECTOMY Left 11/03/2012   Procedure: LUMBAR LAMINECTOMY/DECOMPRESSION MICRODISCECTOMY 1 LEVEL;  Surgeon: Hewitt Shorts, MD;  Location: [facility] NEURO ORS;  Service: Neurosurgery;  Laterality: Left;  LEFT L5S1 laminotomy foraminotomy and possible microdiskectomy   MELANOMA EXCISION     scalp   NASAL SINUS SURGERY     ORIF PATELLA Left 01/19/2020    Procedure: LEFT OPEN REDUCTION INTERNAL (ORIF) FIXATION PATELLA WITH AUTO GRAFTING USING HAMSTRING REINFORCEMENT;  Surgeon: Cammy Copa, MD;  Location: [facility] OR;  Service: Orthopedics;  Laterality: Left;   POSTERIOR CERVICAL FUSION/FORAMINOTOMY Right 10/01/2012   Procedure: POSTERIOR CERVICAL FUSION/FORAMINOTOMY LEVEL 1;  Surgeon: Hewitt Shorts, MD;  Location: [facility] NEURO ORS;  Service: Neurosurgery;  Laterality: Right;  Right Cervical seven thoracic one Cervical laminectomy/foraminotomy with posterior cervical arthrodesis    POSTERIOR LUMBAR FUSION N/A 05/23/2013   Procedure: exploration of lumbar wound, explantation of left interbody implant.;  Surgeon: Hewitt Shorts, MD;  Location[facility] OR;  Service: Neurosurgery;  Laterality: N/A;   RECTAL SURGERY     Fissure   TOTAL KNEE ARTHROPLASTY Left 05/07/2019   Procedure: LEFT TOTAL KNEE ARTHROPLASTY;  Surgeon: Cammy Copa, MD;  Location: [facility] OR;  Service:  Orthopedics;  Laterality: Left;   VITRECTOMY     right and left 2013   Family History  Problem Relation Age of Onset   Dementia Mother    Anemia Father    Heart disease Father    Stroke Sister        ICH   Lung disease Sister    Social History   Tobacco Use   Smoking status: Former    Current packs/day: 0.00    Average packs/day: 1 pack/day for 33.0 years (33.0 ttl pk-yrs)    Types: Cigarettes    Start date: 09/29/1948    Quit date: 09/29/1981    Years since quitting: 41.5   Smokeless tobacco: Never  Vaping Use   Vaping status: Never Used  Substance Use Topics   Alcohol use: Yes    Alcohol/week: 21.0 standard drinks of alcohol    Types: 21 Shots of liquor per week    Comment: finishes 1/2L per week dark rum.- 3 shots of liquor a day   Drug use: No   Current Outpatient Medications  Medication Sig Dispense Refill   amLODipine (NORVASC) 5 MG tablet TAKE 1 TABLET (5 MG TOTAL) BY MOUTH DAILY. 90 tablet 1   amoxicillin-clavulanate (AUGMENTIN) 875-125 MG tablet Take 1  tablet by mouth 2 (two) times daily. 20 tablet 0   aspirin EC 81 MG tablet Take 1 tablet (81 mg total) by mouth daily. Swallow whole. 30 tablet 0   clotrimazole (LOTRIMIN) 1 % cream APPLY TO AFFECTED AREA TWICE A DAY (Patient taking differently: Apply 1 Application topically 2 (two) times daily as needed (for dryness).) 30 g 0   doxycycline (VIBRA-TABS) 100 MG tablet Take 1 tablet (100 mg total) by mouth 2 (two) times daily. 14 tablet 0   ELIQUIS 5 MG TABS tablet TAKE 1 TABLET BY MOUTH TWICE A DAY 60 tablet 5   fluticasone (FLONASE) 50 MCG/ACT nasal spray Place 1 spray into both nostrils daily as needed for allergies.     furosemide (LASIX) 20 MG tablet Take 1 tablet (20 mg total) by mouth daily. 90 tablet 3   isosorbide mononitrate (IMDUR) 30 MG 24 hr tablet TAKE 1 TABLET BY MOUTH EVERY DAY 90 tablet 1   losartan (COZAAR) 50 MG tablet Take 1 tablet (50 mg total) by mouth daily. 90 tablet 3   metoprolol succinate (TOPROL-XL) 50 MG 24 hr tablet TAKE 1 TABLET BY MOUTH EVERY DAY 90 tablet 3   Oxycodone HCl 10 MG TABS Take 10-20 mg by mouth See admin instructions. Take 20 mg by mouth in the morning and evening. May take an additional 10 mg once a day around noontime as needed for unresolved pain.     predniSONE (DELTASONE) 20 MG tablet Take 2 tablets (40 mg total) by mouth daily with breakfast. 14 tablet 0   SYMPROIC 0.2 MG TABS Take 0.2 mg by mouth in the morning.     tamsulosin (FLOMAX) 0.4 MG CAPS capsule TAKE 1 CAPSULE BY MOUTH EVERY DAY (Patient taking differently: Take 0.4 mg by mouth every evening.) 90 capsule 1   triamcinolone cream (KENALOG) 0.1 % Apply 1 Application topically 2 (two) times daily as needed (rough/irritated skin).     No current facility-administered medications for this visit.   Allergies  Allergen Reactions   Amlodipine Swelling and Other (See Comments)    Ankle swelling   Lyrica [Pregabalin] Swelling and Other (See Comments)    Hands and feet swell   Gabapentin Other  (See Comments)  Lower extremity edema- knees and feet     Review of Systems: All systems reviewed and negative except where noted in HPI.    No results found.  Physical Exam: BP (!) 90/56   Pulse 67   Ht 6' (1.829 m)   Wt 195 lb 12.8 oz (88.8 kg)   BMI 26.56 kg/m  Constitutional: Pleasant,well-developed, elderly Caucasian male in no acute distress.  Uses walker for ambulation.  Able to climb onto exam table unassisted. HEENT: Normocephalic and atraumatic. Conjunctivae are normal. No scleral icterus. Neck supple.  Cardiovascular: Normal rate, regular rhythm.  Pulmonary/chest: Effort normal and breath sounds normal. No wheezing, rales or rhonchi. Abdominal: Soft, nondistended, nontender. Bowel sounds active throughout. There are no masses palpable. No hepatomegaly.  Well healed surgical scars. Extremities: Lower extremities wrapped in compressive bandages, not removed Lymphadenopathy: No cervical adenopathy noted. Neurological: Alert and oriented to person place and time. Psychiatric: Normal mood and affect. Behavior is normal.  CBC    Component Value Date/Time   WBC 7.7 12/27/2022 0653   RBC 4.01 (L) 12/27/2022 0653   HGB 11.5 (L) 12/27/2022 0653   HCT 37.1 (L) 12/27/2022 0653   PLT 330 12/27/2022 0653   MCV 92.5 12/27/2022 0653   MCH 28.7 12/27/2022 0653   MCHC 31.0 12/27/2022 0653   RDW 16.0 (H) 12/27/2022 0653   LYMPHSABS 1.8 01/03/2022 1050   MONOABS 0.8 01/03/2022 1050   EOSABS 0.2 01/03/2022 1050   BASOSABS 0.1 01/03/2022 1050    CMP     Component Value Date/Time   NA 140 12/27/2022 0653   K 3.8 12/27/2022 0653   CL 111 12/27/2022 0653   CO2 23 12/27/2022 0653   GLUCOSE 101 (H) 12/27/2022 0653   BUN 16 12/27/2022 0653   CREATININE 1.06 12/27/2022 0653   CREATININE 1.00 05/04/2020 1500   CALCIUM 8.9 12/27/2022 0653   PROT 6.2 (L) 07/23/2022 2038   ALBUMIN 3.1 (L) 07/23/2022 2038   AST 21 07/23/2022 2038   ALT 14 07/23/2022 2038   ALKPHOS 92  07/23/2022 2038   BILITOT 0.3 07/23/2022 2038   GFRNONAA >60 12/27/2022 0653   GFRAA 58 (L) 01/19/2020 0659       Latest Ref Rng & Units 12/27/2022    6:53 AM 07/28/2022   12:35 AM 07/27/2022   12:20 AM  CBC EXTENDED  WBC 4.0 - 10.5 K/uL 7.7  7.0  8.4   RBC 4.22 - 5.81 MIL/uL 4.01  3.61  3.95   Hemoglobin 13.0 - 17.0 g/dL 16.1  09.6  04.5   HCT 39.0 - 52.0 % 37.1  33.2  35.8   Platelets 150 - 400 K/uL 330  374  396       ASSESSMENT AND PLAN: 89 year old male with history of GERD and dysphagia secondary to peptic stricture, last dilated in 2016, with recurrence of dysphagia.  No weight loss or liquid dysphagia.    Chronic, stable anemia.  An EGD with dilation is indicated.  He has depressed EF and multi-vessel CAD and was recommended to follow up with cardiology following his catheterization in Dec 2023, but I cannot see any follow up documented, and the patient cannot reliably tell me if he has seen his cardiologist as an outpatient.  He recently had carpal tunnel surgery, so it is likely he had cardiac clearance for this surgery.  If so, no further cardiac clearance will be needed.  However, we need to get documentation of his clearance.  Will reach out  to Dr. Roseanne Kaufman office regarding clearance.  Will plan to hold Eliquis for 2 days.   Will obtain esophagram for now.  If esophagram shows very tight stricture, we may elect to the procedure in the hospital to provide access to fluoroscopy and and neonatal scope if needed.  Dysphagia - Esophagram - Cardiac clearance - EGD, location ([facility] vs hospital) to be determined - Hold Eliquis for 2 days prior to EGD  History of pulmonary embolism  Coronary artery disease  The details, risks (including bleeding, perforation, infection, missed lesions, medication reactions and possible hospitalization or surgery if complications occur), benefits, and alternatives to EGD with possible biopsy and possible dilation were discussed with the patient  and he consents to proceed.   Blimie Vaness E. Tomasa Rand, MD [facility]    [place], Belenda Cruise, NP

## 2023-04-18 ENCOUNTER — Encounter (HOSPITAL_BASED_OUTPATIENT_CLINIC_OR_DEPARTMENT_OTHER): Payer: Medicare Other | Attending: General Surgery | Admitting: General Surgery

## 2023-04-18 DIAGNOSIS — Z09 Encounter for follow-up examination after completed treatment for conditions other than malignant neoplasm: Secondary | ICD-10-CM | POA: Diagnosis present

## 2023-04-18 DIAGNOSIS — I2510 Atherosclerotic heart disease of native coronary artery without angina pectoris: Secondary | ICD-10-CM | POA: Insufficient documentation

## 2023-04-18 DIAGNOSIS — Z872 Personal history of diseases of the skin and subcutaneous tissue: Secondary | ICD-10-CM | POA: Diagnosis not present

## 2023-04-18 DIAGNOSIS — I110 Hypertensive heart disease with heart failure: Secondary | ICD-10-CM | POA: Insufficient documentation

## 2023-04-18 DIAGNOSIS — I872 Venous insufficiency (chronic) (peripheral): Secondary | ICD-10-CM | POA: Insufficient documentation

## 2023-04-18 DIAGNOSIS — I509 Heart failure, unspecified: Secondary | ICD-10-CM | POA: Insufficient documentation

## 2023-04-18 DIAGNOSIS — R600 Localized edema: Secondary | ICD-10-CM | POA: Diagnosis not present

## 2023-04-18 NOTE — Progress Notes (Signed)
ESLEY, [name] ([id number]) [id number] Page 1 of 14 Visit Report for 04/18/2023 Arrival Information Details Patient Name: Date of Service: [name] 04/18/2023 10:00 A M Medical Record Number: [id number] Patient Account Number: [id number] Date of Birth/Sex: Treating RN: 06-20-1932 (89 y.o. M) Primary Care Aldrin Engelhard: Alcide Clever Other Clinician: Referring Kariem Wolfson: Treating Jaiden Dinkins/Extender: Duanne Guess [facility], MICHELLE Weeks in Treatment: 13 Visit Information History Since Last Visit All ordered tests and consults were completed: No Patient Arrived: Walker Added or deleted any medications: No Arrival Time: 09:59 Any new allergies or adverse reactions: No Accompanied By: self Had a fall or experienced change in No Transfer Assistance: None activities of daily living that may affect Patient Identification Verified: Yes risk of falls: Secondary Verification Process Completed: Yes Signs or symptoms of abuse/neglect since last visito No Patient Requires Transmission-Based Precautions: No Hospitalized since last visit: No Patient Has Alerts: No Implantable device outside of the clinic excluding No cellular tissue based products placed in the center since last visit: Pain Present Now: No Electronic Signature(s) Signed: 04/18/2023 10:43:23 AM By: Dayton Scrape Entered By: Dayton Scrape on 04/18/2023 06:59:43 -------------------------------------------------------------------------------- Clinic Level of Care Assessment Details Patient Name: Date of Service: [place]. 04/18/2023 10:00 A M Medical Record Number: [id number] Patient Account Number: [id number] Date of Birth/Sex: Treating RN: Oct 27, 1931 (89 y.o. [name] Primary Care Tylen Leverich: Alcide Clever Other Clinician: Referring Shelva Hetzer: Treating Allyah Heather/Extender: Duanne Guess [facility], MICHELLE Weeks in Treatment: 13 Clinic Level of Care Assessment Items TOOL 4  Quantity Score X- 1 0 Use when only an EandM is performed on FOLLOW-UP visit ASSESSMENTS - Nursing Assessment / Reassessment X- 1 10 Reassessment of Co-morbidities (includes updates in patient status) X- 1 5 Reassessment of Adherence to Treatment Plan ASSESSMENTS - Wound and Skin A ssessment / Reassessment []  - 0 Simple Wound Assessment / Reassessment - one wound []  - 0 Complex Wound Assessment / Reassessment - multiple wounds []  - 0 Dermatologic / Skin Assessment (not related to wound area) ASSESSMENTS - Focused Assessment []  - 0 Circumferential Edema Measurements - multi extremities []  - 0 Nutritional Assessment / Counseling / Intervention RAEBURN, TOKUDA ([id number]) [id number] Page 2 of 14 []  - 0 Lower Extremity Assessment (monofilament, tuning fork, pulses) []  - 0 Peripheral Arterial Disease Assessment (using hand held doppler) ASSESSMENTS - Ostomy and/or Continence Assessment and Care []  - 0 Incontinence Assessment and Management []  - 0 Ostomy Care Assessment and Management (repouching, etc.) PROCESS - Coordination of Care X - Simple Patient / Family Education for ongoing care 1 15 []  - 0 Complex (extensive) Patient / Family Education for ongoing care X- 1 10 Staff obtains Chiropractor, Records, T Results / Process Orders est X- 1 10 Staff telephones HHA, Nursing Homes / Clarify orders / etc []  - 0 Routine Transfer to another Facility (non-emergent condition) []  - 0 Routine Hospital Admission (non-emergent condition) []  - 0 New Admissions / Manufacturing engineer / Ordering NPWT Apligraf, etc. , []  - 0 Emergency Hospital Admission (emergent condition) X- 1 10 Simple Discharge Coordination []  - 0 Complex (extensive) Discharge Coordination PROCESS - Special Needs []  - 0 Pediatric / Minor Patient Management []  - 0 Isolation Patient Management []  - 0 Hearing / Language / Visual special needs []  - 0 Assessment of Community  assistance (transportation, D/C planning, etc.) []  - 0 Additional assistance / Altered mentation []  - 0 Support Surface(s) Assessment (bed, cushion, seat, etc.) INTERVENTIONS - Wound Cleansing / Measurement []  - 0 Simple Wound  Cleansing - one wound []  - 0 Complex Wound Cleansing - multiple wounds X- 1 5 Wound Imaging (photographs - any number of wounds) []  - 0 Wound Tracing (instead of photographs) X- 1 5 Simple Wound Measurement - one wound []  - 0 Complex Wound Measurement - multiple wounds INTERVENTIONS - Wound Dressings []  - 0 Small Wound Dressing one or multiple wounds []  - 0 Medium Wound Dressing one or multiple wounds []  - 0 Large Wound Dressing one or multiple wounds []  - 0 Application of Medications - topical []  - 0 Application of Medications - injection INTERVENTIONS - Miscellaneous []  - 0 External ear exam []  - 0 Specimen Collection (cultures, biopsies, blood, body fluids, etc.) []  - 0 Specimen(s) / Culture(s) sent or taken to Lab for analysis []  - 0 Patient Transfer (multiple staff / Nurse, adult / Similar devices) []  - 0 Simple Staple / Suture removal (25 or less) []  - 0 Complex Staple / Suture removal (26 or more) []  - 0 Hypo / Hyperglycemic Management (close monitor of Blood Glucose) AMICHAI, ELKS E ([id number]) [id number] Page 3 of 14 []  - 0 Ankle / Brachial Index (ABI) - do not check if billed separately X- 1 5 Vital Signs Has the patient been seen at the hospital within the last three years: Yes Total Score: 75 Level Of Care: New/Established - Level 2 Electronic Signature(s) Signed: 04/18/2023 12:40:06 PM By: Tommie Ard RN Entered By: Tommie Ard on 04/18/2023 07:17:24 -------------------------------------------------------------------------------- Lower Extremity Assessment Details Patient Name: Date of Service: [name], Jordan E. 04/18/2023 10:00 A M Medical Record Number: [id number] Patient Account Number:  [id number] Date of Birth/Sex: Treating RN: [birth year] (89 y.o. [name] Primary Care Elin Seats: Alcide Clever Other Clinician: Referring Cheridan Kibler: Treating Marny Smethers/Extender: Duanne Guess [facility], MICHELLE Weeks in Treatment: 13 Edema Assessment Assessed: [Left: No] [Right: No] Edema: [Left: Yes] [Right: Yes] Calf Left: Right: Point of Measurement: From Medial Instep 33.2 cm 33.4 cm Ankle Left: Right: Point of Measurement: From Medial Instep 22.4 cm 22.5 cm Vascular Assessment Pulses: Dorsalis Pedis Palpable: [Left:Yes] [Right:Yes] Extremity colors, hair growth, and conditions: Extremity Color: [Left:Hyperpigmented] [Right:Hyperpigmented] Hair Growth on Extremity: [Left:No] [Right:No] Temperature of Extremity: [Left:Warm] [Right:Warm] Capillary Refill: [Left:< 3 seconds] [Right:< 3 seconds] Dependent Rubor: [Left:No No] [Right:No No] Electronic Signature(s) Signed: 04/18/2023 12:40:06 PM By: Tommie Ard RN Entered By: Tommie Ard on 04/18/2023 07:07:37 -------------------------------------------------------------------------------- Multi Wound Chart Details Patient Name: Date of Service: Leone Payor [name], Everet E. 04/18/2023 10:00 A M Medical Record Number: [id number] Patient Account Number: [id number] Date of Birth/Sex: Treating RN: 20-Sep-1931 (89 y.o. M) Primary Care Akshara Blumenthal: Alcide Clever Other Clinician: Referring Bhavika Schnider: Treating Darwin Guastella/Extender: Duanne Guess [facility], MICHELLE Weeks in Treatment: [address] [place] E ([id number]) [id number] Page 4 of 14 Vital Signs Height(in): 72 Pulse(bpm): 70 Weight(lbs): 198 Blood Pressure(mmHg): 147/74 Body Mass Index(BMI): 26.9 Temperature(F): 98.0 Respiratory Rate(breaths/min): 18 [4:Photos: No Photos] Left, Medial Ankle Left, Anterior Lower Leg Left, Lateral Lower Leg Wound Location: Blister Gradually Appeared Blister Wounding Event: Venous Leg Ulcer Venous Leg Ulcer  Venous Leg Ulcer Primary Etiology: Cataracts, Congestive Heart Failure, Cataracts, Congestive Heart Failure, Cataracts, Congestive Heart Failure, Comorbid History: Coronary Artery Disease, Coronary Artery Disease, Coronary Artery Disease, Hypertension, Peripheral Venous Hypertension, Peripheral Venous Hypertension, Peripheral Venous Disease, Osteoarthritis, Neuropathy Disease, Osteoarthritis, Neuropathy Disease, Osteoarthritis, Neuropathy 03/20/2023 03/27/2023 04/04/2023 Date Acquired: 4 3 2  Weeks of Treatment: Healed - Epithelialized Healed - Epithelialized Healed - Epithelialized Wound Status: No No No Wound Recurrence: 0x0x0 0x0x0 0x0x0 Measurements L x W x  D (cm) 0 0 0 A (cm) : rea 0 0 0 Volume (cm) : 100.00% 100.00% 100.00% % Reduction in Area: 100.00% 100.00% 100.00% % Reduction in Volume: Full Thickness Without Exposed Full Thickness Without Exposed Full Thickness Without Exposed Classification: Support Structures Support Structures Support Structures None Present None Present None Present Exudate Amount: Distinct, outline attached Distinct, outline attached Distinct, outline attached Wound Margin: Large (67-100%) Large (67-100%) Large (67-100%) Granulation Amount: Red Red Red Granulation Quality: None Present (0%) None Present (0%) None Present (0%) Necrotic Amount: Fascia: No Fat Layer (Subcutaneous Tissue): Yes Fascia: No Exposed Structures: Fat Layer (Subcutaneous Tissue): No Fascia: No Fat Layer (Subcutaneous Tissue): No Tendon: No Tendon: No Tendon: No Muscle: No Muscle: No Muscle: No Joint: No Joint: No Joint: No Bone: No Bone: No Bone: No None Small (1-33%) None Epithelialization: No Abnormalities Noted No Abnormalities Noted No Abnormalities Noted Periwound Skin Texture: No Abnormalities Noted No Abnormalities Noted Dry/Scaly: Yes Periwound Skin Moisture: Maceration: No Hemosiderin Staining: Yes Hemosiderin Staining: Yes Hemosiderin  Staining: Yes Periwound Skin Color: No Abnormality No Abnormality No Abnormality Temperature: Wound Number: 7 N/A N/A Photos: N/A N/A Right, Lateral Lower Leg N/A N/A Wound Location: Blister N/A N/A Wounding Event: Venous Leg Ulcer N/A N/A Primary Etiology: Cataracts, Congestive Heart Failure, N/A N/A Comorbid History: Coronary Artery Disease, Hypertension, Peripheral Venous Disease, Osteoarthritis, Neuropathy 04/04/2023 N/A N/A Date Acquired: 2 N/A N/A Weeks of Treatment: Healed - Epithelialized N/A N/A Wound Status: No N/A N/A Wound Recurrence: 0x0x0 N/A N/A Measurements L x W x D (cm) 0 N/A N/A A (cm) : rea 0 N/A N/A Volume (cm) : 100.00% N/A N/A % Reduction in Area: 100.00% N/A N/A % Reduction in VolumeZYGMUND, SIEGMANN E ([id number]) [id number] Page 5 of 14 Full Thickness Without Exposed N/A N/A Classification: Support Structures None Present N/A N/A Exudate Amount: Distinct, outline attached N/A N/A Wound Margin: Large (67-100%) N/A N/A Granulation Amount: Red, Pink N/A N/A Granulation Quality: None Present (0%) N/A N/A Necrotic Amount: Fascia: No N/A N/A Exposed Structures: Fat Layer (Subcutaneous Tissue): No Tendon: No Muscle: No Joint: No Bone: No None N/A N/A Epithelialization: No Abnormalities Noted N/A N/A Periwound Skin Texture: No Abnormalities Noted N/A N/A Periwound Skin Moisture: Hemosiderin Staining: Yes N/A N/A Periwound Skin Color: No Abnormality N/A N/A Temperature: Treatment Notes Wound #4 (Ankle) Wound Laterality: Left, Medial Cleanser Soap and Water Discharge Instruction: May shower and wash wound with dial antibacterial soap and water prior to dressing change. Wound Cleanser Discharge Instruction: Cleanse the wound with wound cleanser prior to applying a clean dressing using gauze sponges, not tissue or cotton balls. Peri-Wound Care Sween Lotion (Moisturizing lotion) Discharge Instruction:  Apply moisturizing lotion as directed Topical Primary Dressing Maxorb Extra Ag+ Alginate Dressing, 2x2 (in/in) Discharge Instruction: Apply to wound bed as instructed Secondary Dressing Woven Gauze Sponge, Non-Sterile 4x4 in Discharge Instruction: Apply over primary dressing as directed. Secured With Compression Wrap Urgo K2, (equivalent to a 4 layer) two layer compression system, regular Discharge Instruction: Apply Urgo K2 as directed (alternative to 4 layer compression). Compression Stockings Add-Ons Wound #5 (Lower Leg) Wound Laterality: Left, Anterior Cleanser Soap and Water Discharge Instruction: May shower and wash wound with dial antibacterial soap and water prior to dressing change. Wound Cleanser Discharge Instruction: Cleanse the wound with wound cleanser prior to applying a clean dressing using gauze sponges, not tissue or cotton balls. Peri-Wound Care Sween Lotion (Moisturizing lotion) Discharge Instruction: Apply moisturizing lotion as directed Topical Primary Dressing Maxorb Extra Ag+  Alginate Dressing, 2x2 (in/in) Discharge Instruction: Apply to wound bed as instructed Secondary Dressing Woven Gauze Sponge, Non-Sterile 4x4 in Discharge Instruction: Apply over primary dressing as directed. Secured With CAYCEN, NODAL ([id number]) [id number] Page 6 of 14 Compression Wrap Urgo K2, (equivalent to a 4 layer) two layer compression system, regular Discharge Instruction: Apply Urgo K2 as directed (alternative to 4 layer compression). Compression Stockings Add-Ons Wound #6 (Lower Leg) Wound Laterality: Left, Lateral Cleanser Soap and Water Discharge Instruction: May shower and wash wound with dial antibacterial soap and water prior to dressing change. Wound Cleanser Discharge Instruction: Cleanse the wound with wound cleanser prior to applying a clean dressing using gauze sponges, not tissue or cotton balls. Peri-Wound Care Triamcinolone  15 (g) Discharge Instruction: Use triamcinolone 15 (g) as directed Sween Lotion (Moisturizing lotion) Discharge Instruction: Apply moisturizing lotion as directed Topical Gentamicin Discharge Instruction: As directed by physician Mupirocin Ointment Discharge Instruction: Apply Mupirocin (Bactroban) as instructed Primary Dressing Maxorb Extra Ag+ Alginate Dressing, 2x2 (in/in) Discharge Instruction: Apply to wound bed as instructed Secondary Dressing Woven Gauze Sponge, Non-Sterile 4x4 in Discharge Instruction: Apply over primary dressing as directed. Secured With Compression Wrap Urgo K2, (equivalent to a 4 layer) two layer compression system, regular Discharge Instruction: Apply Urgo K2 as directed (alternative to 4 layer compression). Compression Stockings Add-Ons Wound #7 (Lower Leg) Wound Laterality: Right, Lateral Cleanser Soap and Water Discharge Instruction: May shower and wash wound with dial antibacterial soap and water prior to dressing change. Wound Cleanser Discharge Instruction: Cleanse the wound with wound cleanser prior to applying a clean dressing using gauze sponges, not tissue or cotton balls. Peri-Wound Care Triamcinolone 15 (g) Discharge Instruction: Use triamcinolone 15 (g) as directed Sween Lotion (Moisturizing lotion) Discharge Instruction: Apply moisturizing lotion as directed Topical Gentamicin Discharge Instruction: As directed by physician Mupirocin Ointment Discharge Instruction: Apply Mupirocin (Bactroban) as instructed Primary Dressing Maxorb Extra Ag+ Alginate Dressing, 2x2 (in/in) Discharge Instruction: Apply to wound bed as instructed Secondary Dressing Woven Gauze Sponge, Non-Sterile 4x4 in TERRIL, JUNKINS E ([id number]) [id number] Page 7 of 14 Discharge Instruction: Apply over primary dressing as directed. Secured With Compression Wrap Urgo K2, (equivalent to a 4 layer) two layer compression system,  regular Discharge Instruction: Apply Urgo K2 as directed (alternative to 4 layer compression). Compression Stockings Add-Ons Electronic Signature(s) Signed: 04/18/2023 10:22:40 AM By: Duanne Guess MD FACS Entered By: Duanne Guess on 04/18/2023 07:22:40 -------------------------------------------------------------------------------- Multi-Disciplinary Care Plan Details Patient Name: Date of Service: [name], Bartosz E. 04/18/2023 10:00 A M Medical Record Number: [id number] Patient Account Number: [id number] Date of Birth/Sex: Treating RN: 11/14/1931 (89 y.o. [name] Primary Care Sharren Schnurr: Alcide Clever Other Clinician: Referring Kaylina Cahue: Treating Britain Anagnos/Extender: Duanne Guess [facility], MICHELLE Weeks in Treatment: 13 Multidisciplinary Care Plan reviewed with physician Active Inactive Electronic Signature(s) Signed: 04/18/2023 12:40:06 PM By: Tommie Ard RN Entered By: Tommie Ard on 04/18/2023 07:28:40 -------------------------------------------------------------------------------- Pain Assessment Details Patient Name: Date of Service: Leone Payor [name], Basem E. 04/18/2023 10:00 A M Medical Record Number: [id number] Patient Account Number: [id number] Date of Birth/Sex: Treating RN: 1931/08/24 (89 y.o. M) Primary Care Rain Wilhide: Alcide Clever Other Clinician: Referring Amaka Gluth: Treating Glen Kesinger/Extender: Duanne Guess [facility], MICHELLE Weeks in Treatment: 66 Active Problems Location of Pain Severity and Description of Pain Patient Has Paino No Site Locations [place], [place] E ([id number]) [id number] Page 8 of 14 Pain Management and Medication Current Pain Management: Electronic Signature(s) Signed: 04/18/2023 10:43:23 AM By: Dayton Scrape Entered By: Dayton Scrape on 04/18/2023 07:00:09 -------------------------------------------------------------------------------- Patient/Caregiver Education  Details Patient Name: Date of  Service: Cubero [name] 9/5/2024andnbsp10:00 A M Medical Record Number: [id number] Patient Account Number: [id number] Date of Birth/Gender: Treating RN: Jul 23, 1932 (89 y.o. [name] Primary Care Physician: Alcide Clever Other Clinician: Referring Physician: Treating Physician/Extender: Duanne Guess [facility], MICHELLE Weeks in Treatment: 72 Education Assessment Education Provided To: Patient Education Topics Provided Electronic Signature(s) Signed: 04/18/2023 12:40:06 PM By: Tommie Ard RN Entered By: Tommie Ard on 04/18/2023 07:28:22 -------------------------------------------------------------------------------- Wound Assessment Details Patient Name: Date of Service: Leone Payor [name], Gerik E. 04/18/2023 10:00 A M Medical Record Number: [id number] Patient Account Number: [id number] Date of Birth/Sex: Treating RN: October 15, 1931 (89 y.o. [name] Primary Care Jailynn Lavalais: Alcide Clever Other Clinician: Referring Taleeya Blondin: Treating Onaje Warne/Extender: Duanne Guess [facility], MICHELLE Weeks in Treatment: 13 Wound Status Wound Number: 4 Primary Venous Leg Ulcer RAFID, WATT E ([id number]) [id number] Page 9 of 14 Etiology: Wound Location: Left, Medial Ankle Wound Healed - Epithelialized Wounding Event: Blister Status: Date Acquired: 03/20/2023 Comorbid Cataracts, Congestive Heart Failure, Coronary Artery Disease, Weeks Of Treatment: 4 History: Hypertension, Peripheral Venous Disease, Osteoarthritis, Clustered Wound: No Neuropathy Wound Measurements Length: (cm) Width: (cm) Depth: (cm) Area: (cm) Volume: (cm) 0 % Reduction in Area: 100% 0 % Reduction in Volume: 100% 0 Epithelialization: None 0 Tunneling: No 0 Undermining: No Wound Description Classification: Full Thickness Without Exposed Support Structures Wound Margin: Distinct, outline attached Exudate Amount: None Present Foul Odor After Cleansing: No Slough/Fibrino  No Wound Bed Granulation Amount: Large (67-100%) Exposed Structure Granulation Quality: Red Fascia Exposed: No Necrotic Amount: None Present (0%) Fat Layer (Subcutaneous Tissue) Exposed: No Tendon Exposed: No Muscle Exposed: No Joint Exposed: No Bone Exposed: No Periwound Skin Texture Texture Color No Abnormalities Noted: Yes No Abnormalities Noted: Yes Moisture Temperature / Pain No Abnormalities Noted: Yes Temperature: No Abnormality Treatment Notes Wound #4 (Ankle) Wound Laterality: Left, Medial Cleanser Soap and Water Discharge Instruction: May shower and wash wound with dial antibacterial soap and water prior to dressing change. Wound Cleanser Discharge Instruction: Cleanse the wound with wound cleanser prior to applying a clean dressing using gauze sponges, not tissue or cotton balls. Peri-Wound Care Sween Lotion (Moisturizing lotion) Discharge Instruction: Apply moisturizing lotion as directed Topical Primary Dressing Maxorb Extra Ag+ Alginate Dressing, 2x2 (in/in) Discharge Instruction: Apply to wound bed as instructed Secondary Dressing Woven Gauze Sponge, Non-Sterile 4x4 in Discharge Instruction: Apply over primary dressing as directed. Secured With Compression Wrap Urgo K2, (equivalent to a 4 layer) two layer compression system, regular Discharge Instruction: Apply Urgo K2 as directed (alternative to 4 layer compression). Compression Stockings Add-Ons Electronic Signature(s) Signed: 04/18/2023 12:40:06 PM By: Tommie Ard RN Entered By: Tommie Ard on 04/18/2023 07:18:20 [name] ([id number]) [id number] Page 10 of 14 -------------------------------------------------------------------------------- Wound Assessment Details Patient Name: Date of Service: [place] NA [name] 04/18/2023 10:00 A M Medical Record Number: [id number] Patient Account Number: [id number] Date of Birth/Sex: Treating RN: September 04, 1931 (89 y.o. [name] Primary Care Jovie Swanner: Alcide Clever Other Clinician: Referring Ogechi Kuehnel: Treating Tiffancy Moger/Extender: Duanne Guess [facility], MICHELLE Weeks in Treatment: 13 Wound Status Wound Number: 5 Primary Venous Leg Ulcer Etiology: Wound Location: Left, Anterior Lower Leg Wound Healed - Epithelialized Wounding Event: Gradually Appeared Status: Date Acquired: 03/27/2023 Comorbid Cataracts, Congestive Heart Failure, Coronary Artery Disease, Weeks Of Treatment: 3 History: Hypertension, Peripheral Venous Disease, Osteoarthritis, Clustered Wound: No Neuropathy Photos Wound Measurements Length: (cm) Width: (cm) Depth: (cm) Area: (cm) Volume: (cm) 0 % Reduction in Area: 100% 0 % Reduction in  Volume: 100% 0 Epithelialization: Small (1-33%) 0 Tunneling: No 0 Undermining: No Wound Description Classification: Full Thickness Without Exposed Support Structures Wound Margin: Distinct, outline attached Exudate Amount: None Present Foul Odor After Cleansing: No Slough/Fibrino No Wound Bed Granulation Amount: Large (67-100%) Exposed Structure Granulation Quality: Red Fascia Exposed: No Necrotic Amount: None Present (0%) Fat Layer (Subcutaneous Tissue) Exposed: Yes Tendon Exposed: No Muscle Exposed: No Joint Exposed: No Bone Exposed: No Periwound Skin Texture Texture Color No Abnormalities Noted: Yes No Abnormalities Noted: Yes Moisture Temperature / Pain No Abnormalities Noted: Yes Temperature: No Abnormality Treatment Notes Wound #5 (Lower Leg) Wound Laterality: Left, Anterior Cleanser Soap and Water Discharge Instruction: May shower and wash wound with dial antibacterial soap and water prior to dressing change. Wound Cleanser PANTELIS, CASTRILLO E ([id number]) [id number] Page 11 of 14 Discharge Instruction: Cleanse the wound with wound cleanser prior to applying a clean dressing using gauze sponges, not tissue or cotton balls. Peri-Wound  Care Sween Lotion (Moisturizing lotion) Discharge Instruction: Apply moisturizing lotion as directed Topical Primary Dressing Maxorb Extra Ag+ Alginate Dressing, 2x2 (in/in) Discharge Instruction: Apply to wound bed as instructed Secondary Dressing Woven Gauze Sponge, Non-Sterile 4x4 in Discharge Instruction: Apply over primary dressing as directed. Secured With Compression Wrap Urgo K2, (equivalent to a 4 layer) two layer compression system, regular Discharge Instruction: Apply Urgo K2 as directed (alternative to 4 layer compression). Compression Stockings Add-Ons Electronic Signature(s) Signed: 04/18/2023 12:40:06 PM By: Tommie Ard RN Entered By: Tommie Ard on 04/18/2023 07:18:32 -------------------------------------------------------------------------------- Wound Assessment Details Patient Name: Date of Service: Leone Payor [name], Quintel E. 04/18/2023 10:00 A M Medical Record Number: [id number] Patient Account Number: [id number] Date of Birth/Sex: Treating RN: [birth year] (89 y.o. [name] Primary Care Nikisha Fleece: Alcide Clever Other Clinician: Referring Jazper Nikolai: Treating Benjermin Korber/Extender: Duanne Guess [facility], MICHELLE Weeks in Treatment: 13 Wound Status Wound Number: 6 Primary Venous Leg Ulcer Etiology: Wound Location: Left, Lateral Lower Leg Wound Healed - Epithelialized Wounding Event: Blister Status: Date Acquired: 04/04/2023 Comorbid Cataracts, Congestive Heart Failure, Coronary Artery Disease, Weeks Of Treatment: 2 History: Hypertension, Peripheral Venous Disease, Osteoarthritis, Clustered Wound: No Neuropathy Photos Wound Measurements Length: (cm) 0 Width: (cm) 0 Depth: (cm) 0 Area: (cm) 0 Volume: (cm) 0 Pain, Hiroyuki E ([id number]) % Reduction in Area: 100% % Reduction in Volume: 100% Epithelialization: None Tunneling: No Undermining: No [id number] Page 12 of 14 Wound Description Classification: Full Thickness  Without Exposed Support Structures Wound Margin: Distinct, outline attached Exudate Amount: None Present Foul Odor After Cleansing: No Slough/Fibrino No Wound Bed Granulation Amount: Large (67-100%) Exposed Structure Granulation Quality: Red Fascia Exposed: No Necrotic Amount: None Present (0%) Fat Layer (Subcutaneous Tissue) Exposed: No Tendon Exposed: No Muscle Exposed: No Joint Exposed: No Bone Exposed: No Periwound Skin Texture Texture Color No Abnormalities Noted: Yes No Abnormalities Noted: No Hemosiderin Staining: Yes Moisture No Abnormalities Noted: No Temperature / Pain Dry / Scaly: Yes Temperature: No Abnormality Maceration: No Treatment Notes Wound #6 (Lower Leg) Wound Laterality: Left, Lateral Cleanser Soap and Water Discharge Instruction: May shower and wash wound with dial antibacterial soap and water prior to dressing change. Wound Cleanser Discharge Instruction: Cleanse the wound with wound cleanser prior to applying a clean dressing using gauze sponges, not tissue or cotton balls. Peri-Wound Care Triamcinolone 15 (g) Discharge Instruction: Use triamcinolone 15 (g) as directed Sween Lotion (Moisturizing lotion) Discharge Instruction: Apply moisturizing lotion as directed Topical Gentamicin Discharge Instruction: As directed by physician Mupirocin Ointment Discharge Instruction: Apply Mupirocin (Bactroban) as instructed Primary Dressing  Maxorb Extra Ag+ Alginate Dressing, 2x2 (in/in) Discharge Instruction: Apply to wound bed as instructed Secondary Dressing Woven Gauze Sponge, Non-Sterile 4x4 in Discharge Instruction: Apply over primary dressing as directed. Secured With Compression Wrap Urgo K2, (equivalent to a 4 layer) two layer compression system, regular Discharge Instruction: Apply Urgo K2 as directed (alternative to 4 layer compression). Compression Stockings Add-Ons Electronic Signature(s) Signed: 04/18/2023 12:40:06 PM By: Tommie Ard  RN Entered By: Tommie Ard on 04/18/2023 07:18:42 [name] ([id number]) [id number] Page 13 of 14 -------------------------------------------------------------------------------- Wound Assessment Details Patient Name: Date of Service: [place] NA [name] 04/18/2023 10:00 A M Medical Record Number: [id number] Patient Account Number: [id number] Date of Birth/Sex: Treating RN: 1931-11-19 (89 y.o. [name] Primary Care Osby Sweetin: Alcide Clever Other Clinician: Referring Tacie Mccuistion: Treating Corisa Montini/Extender: Duanne Guess [facility], MICHELLE Weeks in Treatment: 13 Wound Status Wound Number: 7 Primary Venous Leg Ulcer Etiology: Wound Location: Right, Lateral Lower Leg Wound Healed - Epithelialized Wounding Event: Blister Status: Date Acquired: 04/04/2023 Comorbid Cataracts, Congestive Heart Failure, Coronary Artery Disease, Weeks Of Treatment: 2 History: Hypertension, Peripheral Venous Disease, Osteoarthritis, Clustered Wound: No Neuropathy Photos Wound Measurements Length: (cm) Width: (cm) Depth: (cm) Area: (cm) Volume: (cm) 0 % Reduction in Area: 100% 0 % Reduction in Volume: 100% 0 Epithelialization: None 0 Tunneling: No 0 Undermining: No Wound Description Classification: Full Thickness Without Exposed Support Structures Wound Margin: Distinct, outline attached Exudate Amount: None Present Foul Odor After Cleansing: No Slough/Fibrino No Wound Bed Granulation Amount: Large (67-100%) Exposed Structure Granulation Quality: Red, Pink Fascia Exposed: No Necrotic Amount: None Present (0%) Fat Layer (Subcutaneous Tissue) Exposed: No Tendon Exposed: No Muscle Exposed: No Joint Exposed: No Bone Exposed: No Periwound Skin Texture Texture Color No Abnormalities Noted: Yes No Abnormalities Noted: No Hemosiderin Staining: Yes Moisture No Abnormalities Noted: Yes Temperature / Pain Temperature: No Abnormality Treatment  Notes Wound #7 (Lower Leg) Wound Laterality: Right, Lateral Cleanser Soap and Water Discharge Instruction: May shower and wash wound with dial antibacterial soap and water prior to dressing change. KORTNEY, CANTERA ([id number]) [id number] Page 14 of 14 Wound Cleanser Discharge Instruction: Cleanse the wound with wound cleanser prior to applying a clean dressing using gauze sponges, not tissue or cotton balls. Peri-Wound Care Triamcinolone 15 (g) Discharge Instruction: Use triamcinolone 15 (g) as directed Sween Lotion (Moisturizing lotion) Discharge Instruction: Apply moisturizing lotion as directed Topical Gentamicin Discharge Instruction: As directed by physician Mupirocin Ointment Discharge Instruction: Apply Mupirocin (Bactroban) as instructed Primary Dressing Maxorb Extra Ag+ Alginate Dressing, 2x2 (in/in) Discharge Instruction: Apply to wound bed as instructed Secondary Dressing Woven Gauze Sponge, Non-Sterile 4x4 in Discharge Instruction: Apply over primary dressing as directed. Secured With Compression Wrap Urgo K2, (equivalent to a 4 layer) two layer compression system, regular Discharge Instruction: Apply Urgo K2 as directed (alternative to 4 layer compression). Compression Stockings Add-Ons Electronic Signature(s) Signed: 04/18/2023 12:40:06 PM By: Tommie Ard RN Entered By: Tommie Ard on 04/18/2023 07:18:52 -------------------------------------------------------------------------------- Vitals Details Patient Name: Date of Service: [name], Devron E. 04/18/2023 10:00 A M Medical Record Number: [id number] Patient Account Number: [id number] Date of Birth/Sex: Treating RN: 1932-03-29 (89 y.o. M) Primary Care Sila Sarsfield: Alcide Clever Other Clinician: Referring Seylah Wernert: Treating Khyran Riera/Extender: Duanne Guess [facility], MICHELLE Weeks in Treatment: 13 Vital Signs Time Taken: 09:59 Temperature (F): 98.0 Height (in): 72 Pulse  (bpm): 70 Weight (lbs): 198 Respiratory Rate (breaths/min): 18 Body Mass Index (BMI): 26.9 Blood Pressure (mmHg): 147/74 Reference Range: 80 - 120 mg / dl Electronic Signature(s) Signed:  04/18/2023 10:43:23 AM By: Dayton Scrape Entered By: [place] Scrape on 04/18/2023 07:00:04

## 2023-04-18 NOTE — Progress Notes (Signed)
TYGA, KACH ([id number]) [id number] Page 1 of 8 Visit Report for 04/18/2023 Chief Complaint Document Details Patient Name: Date of Service: [name] 04/18/2023 10:00 A M Medical Record Number: [id number] Patient Account Number: [id number] Date of Birth/Sex: Treating RN: Nov 23, 1931 (89 y.o. M) Primary Care Provider: Alcide Clever Other Clinician: Referring Provider: Treating Provider/Extender: Duanne Guess [facility], MICHELLE Weeks in Treatment: 13 Information Obtained from: Patient Chief Complaint Patient presents for treatment of an open ulcer due to venous insufficiency Electronic Signature(s) Signed: 04/18/2023 10:25:55 AM By: Duanne Guess MD FACS Entered By: Duanne Guess on 04/18/2023 07:25:55 -------------------------------------------------------------------------------- HPI Details Patient Name: Date of Service: [name], Jordan E. 04/18/2023 10:00 A M Medical Record Number: [id number] Patient Account Number: [id number] Date of Birth/Sex: Treating RN: [birth year] (89 y.o. M) Primary Care Provider: Alcide Clever Other Clinician: Referring Provider: Treating Provider/Extender: Duanne Guess [facility], MICHELLE Weeks in Treatment: 13 History of Present Illness HPI Description: ADMISSION 01/11/2023 This is an 89 year old nondiabetic with a history of chronic venous insufficiency, coronary artery disease, congestive heart failure, and lower extremity edema. He has historically been followed by podiatry for various ulcers and calluses on his feet. When he was seen in the middle of April, he was noted to have an ulcer starting on his left ankle. He was prescribed a course of cephalexin and advised to apply Betadine to the wound with an Ace wrap. He was referred to the wound care center for further evaluation and management. Apparently the wound closed of its own accord prior to his appointment with [place], but subsequently reopened.  The notes from podiatry do indicate that compression and elevation were discussed as means to help heal ulceration and avoid future episodes. After the wound reopened, he was seen again and prescribed a course of Augmentin. He is now here for his wound care appointment. 01/21/2023: The wound is smaller and more superficial. It is fairly clean with just a little bit of light slough on the surface. His venous reflux studies are scheduled for this coming Thursday. 01/29/2023: The wound is smaller again today and very superficial. There is minimal slough present with a little eschar around the edges. His venous reflux studies were completed, and as expected, he has severe venous reflux. 02/07/2023: His wound is very nearly closed. He has an appointment coming up with vascular surgery on July 8. He indicated a callus on the bottom of his left foot that is bothering him today. 02/15/2023: There is just a pinhole opening remaining on the left, but unfortunately, he has had a new ulceration occur on the medial aspect of his right lower leg. The fat layer is exposed. He does have 2+ pitting edema in this leg. He says he noted fluid coming from the site a couple of days ago. He does not wear compression garments due to discomfort with stockings, but he has never received juxta lite or Farrow wrap garments. 02/22/2023: His left leg has healed. On the right, the open area has contracted considerably. There is some slough and eschar present. He did meet with vascular surgery for his initial consultation and will return in 3 months to discuss saphenous vein ablation. 03/04/2023: His wraps slid down bilaterally and he is leaking clear fluid from the left leg. There is slough build up on the right lower leg wound, but the overall size is smaller. 03/11/2023: The area on his left leg where he was leaking fluid has closed. The right lower leg wound is smaller and clean. He  does have his Farrow wraps with him today. HAKIM, HANN ([id number]) [id number] Page 2 of 8 03/27/2023: At his nurse visit last week, his left medial ankle was found to be open. He has another new wound today on the left anterior tibial surface. The right ankle ulcer is smaller today. Everything is very superficial and clean. 04/04/2023: Despite being in bilateral 3 layer compression equivalent, he has developed new wounds. All of these seem to be secondary to blistering. He has a new wound on his right lateral lower leg just above the ankle and 2 new sites on his left anterolateral lower leg. These are fairly tender and a the skin is little bit erythematous around them. 04/18/2023: His wounds are healed. Electronic Signature(s) Signed: 04/18/2023 10:26:15 AM By: Duanne Guess MD FACS Entered By: Duanne Guess on 04/18/2023 07:26:15 -------------------------------------------------------------------------------- Physical Exam Details Patient Name: Date of Service: [name], Jordan E. 04/18/2023 10:00 A M Medical Record Number: [id number] Patient Account Number: [id number] Date of Birth/Sex: Treating RN: [birth year] (89 y.o. M) Primary Care Provider: Alcide Clever Other Clinician: Referring Provider: Treating Provider/Extender: Duanne Guess [facility], MICHELLE Weeks in Treatment: 13 Constitutional Slightly hypertensive. . . . no acute distress. Respiratory Normal work of breathing on room air. Notes 04/18/2023: All of his wounds are healed and edema control is excellent. Electronic Signature(s) Signed: 04/18/2023 10:26:56 AM By: Duanne Guess MD FACS Entered By: Duanne Guess on 04/18/2023 07:26:56 -------------------------------------------------------------------------------- Physician Orders Details Patient Name: Date of Service: Leone Payor [name], Jordan E. 04/18/2023 10:00 A M Medical Record Number: [id number] Patient Account Number: [id number] Date of Birth/Sex: Treating RN: [birth year] (89 y.o. [name] Primary Care Provider: Alcide Clever Other Clinician: Referring Provider: Treating Provider/Extender: Duanne Guess [facility], MICHELLE Weeks in Treatment: 93 Verbal / Phone Orders: No Diagnosis Coding ICD-10 Coding Code Description [phone] Non-pressure chronic ulcer of other part of right lower leg with fat layer exposed L97.822 Non-pressure chronic ulcer of other part of left lower leg with fat layer exposed L97.321 Non-pressure chronic ulcer of left ankle limited to breakdown of skin I87.2 Venous insufficiency (chronic) (peripheral) I50.9 Heart failure, unspecified R60.0 Localized edema I25.10 Atherosclerotic heart disease of native coronary artery without angina pectoris L84 Corns and callosities TELLER, BALGOBIN E ([id number]) [id number] Page 3 of 8 Discharge From [facility] Services Discharge from Wound Care Center - Congratulations on your healing! Call us if you need [place]!! Anesthetic (In clinic) Topical Lidocaine 4% applied to wound bed Bathing/ Shower/ Hygiene May shower with protection but do not get wound dressing(s) wet. Protect dressing(s) with water repellant cover (for example, large plastic bag) or a cast cover and may then take shower. Edema Control - Lymphedema / SCD / Other Elevate legs to the level of the heart or above for 30 minutes daily and/or when sitting for 3-4 times a day throughout the day. - throughout the day Avoid standing for long periods of time. Exercise regularly Compression stocking or Garment 20-30 mm/Hg pressure to: - left leg daily Other Edema Control Orders/Instructions: - wear juxtalites daily Electronic Signature(s) Signed: 04/18/2023 10:28:43 AM By: Duanne Guess MD FACS Entered By: Duanne Guess on 04/18/2023 07:27:04 -------------------------------------------------------------------------------- Problem List Details Patient Name: Date of Service: Leone Payor [name], Dionisios E. 04/18/2023 10:00 A  M Medical Record Number: [id number] Patient Account Number: [id number] Date of Birth/Sex: Treating RN: 16-Apr-1932 (89 y.o. M) Primary Care Provider: Alcide Clever Other Clinician: Referring Provider: Treating Provider/Extender: Duanne Guess [facility], MICHELLE Weeks in Treatment: 40 Active Problems ICD-10 Encounter Code  Description Active Date MDM Diagnosis L97.812 Non-pressure chronic ulcer of other part of right lower leg with fat layer 02/15/2023 No Yes exposed L97.822 Non-pressure chronic ulcer of other part of left lower leg with fat layer exposed8/14/2024 No Yes L97.321 Non-pressure chronic ulcer of left ankle limited to breakdown of skin 03/27/2023 No Yes I87.2 Venous insufficiency (chronic) (peripheral) 01/11/2023 No Yes I50.9 Heart failure, unspecified 01/11/2023 No Yes R60.0 Localized edema 01/11/2023 No Yes I25.10 Atherosclerotic heart disease of native coronary artery without angina pectoris 01/11/2023 No Yes L84 Corns and callosities 02/07/2023 No Yes RILYN, DELKER E ([id number]) [id number] Page 4 of 8 Inactive Problems Resolved Problems Electronic Signature(s) Signed: 04/18/2023 10:22:32 AM By: Duanne Guess MD FACS Entered By: Duanne Guess on 04/18/2023 07:22:32 -------------------------------------------------------------------------------- Progress Note Details Patient Name: Date of Service: [name], Jordan E. 04/18/2023 10:00 A M Medical Record Number: [id number] Patient Account Number: [id number] Date of Birth/Sex: Treating RN: 1932/06/29 (89 y.o. M) Primary Care Provider: Alcide Clever Other Clinician: Referring Provider: Treating Provider/Extender: Duanne Guess [facility], MICHELLE Weeks in Treatment: 13 Subjective Chief Complaint Information obtained from Patient Patient presents for treatment of an open ulcer due to venous insufficiency History of Present Illness (HPI) ADMISSION 01/11/2023 This is an 89 year old  nondiabetic with a history of chronic venous insufficiency, coronary artery disease, congestive heart failure, and lower extremity edema. He has historically been followed by podiatry for various ulcers and calluses on his feet. When he was seen in the middle of April, he was noted to have an ulcer starting on his left ankle. He was prescribed a course of cephalexin and advised to apply Betadine to the wound with an Ace wrap. He was referred to the wound care center for further evaluation and management. Apparently the wound closed of its own accord prior to his appointment with [place], but subsequently reopened. The notes from podiatry do indicate that compression and elevation were discussed as means to help heal ulceration and avoid future episodes. After the wound reopened, he was seen again and prescribed a course of Augmentin. He is now here for his wound care appointment. 01/21/2023: The wound is smaller and more superficial. It is fairly clean with just a little bit of light slough on the surface. His venous reflux studies are scheduled for this coming Thursday. 01/29/2023: The wound is smaller again today and very superficial. There is minimal slough present with a little eschar around the edges. His venous reflux studies were completed, and as expected, he has severe venous reflux. 02/07/2023: His wound is very nearly closed. He has an appointment coming up with vascular surgery on July 8. He indicated a callus on the bottom of his left foot that is bothering him today. 02/15/2023: There is just a pinhole opening remaining on the left, but unfortunately, he has had a new ulceration occur on the medial aspect of his right lower leg. The fat layer is exposed. He does have 2+ pitting edema in this leg. He says he noted fluid coming from the site a couple of days ago. He does not wear compression garments due to discomfort with stockings, but he has never received juxta lite or Farrow wrap  garments. 02/22/2023: His left leg has healed. On the right, the open area has contracted considerably. There is some slough and eschar present. He did meet with vascular surgery for his initial consultation and will return in 3 months to discuss saphenous vein ablation. 03/04/2023: His wraps slid down bilaterally and he is leaking clear fluid  from the left leg. There is slough build up on the right lower leg wound, but the overall size is smaller. 03/11/2023: The area on his left leg where he was leaking fluid has closed. The right lower leg wound is smaller and clean. He does have his Farrow wraps with him today. 03/27/2023: At his nurse visit last week, his left medial ankle was found to be open. He has another new wound today on the left anterior tibial surface. The right ankle ulcer is smaller today. Everything is very superficial and clean. 04/04/2023: Despite being in bilateral 3 layer compression equivalent, he has developed new wounds. All of these seem to be secondary to blistering. He has a new wound on his right lateral lower leg just above the ankle and 2 new sites on his left anterolateral lower leg. These are fairly tender and a the skin is little bit erythematous around them. 04/18/2023: His wounds are healed. Patient History Information obtained from Patient, Chart. Family History Lung Disease - Siblings, Stroke - Siblings, No family history of Cancer, Diabetes, Heart Disease, Hereditary Spherocytosis, Hypertension, Kidney Disease, Seizures, Thyroid Problems, Tuberculosis. KOREE, RAPPLEYE ([id number]) [id number] Page 5 of 8 Social History Former smoker, Marital Status - Married, Alcohol Use - Moderate, Drug Use - No History, Caffeine Use - Rarely. Medical History Eyes Patient has history of Cataracts - bil extractions Denies history of Glaucoma, Optic Neuritis Ear/Nose/Mouth/Throat Denies history of Chronic sinus problems/congestion, Middle ear  problems Cardiovascular Patient has history of Congestive Heart Failure, Coronary Artery Disease, Hypertension, Peripheral Venous Disease Endocrine Denies history of Type I Diabetes, Type II Diabetes Integumentary (Skin) Denies history of History of Burn Musculoskeletal Patient has history of Osteoarthritis Neurologic Patient has history of Neuropathy Oncologic Denies history of Received Chemotherapy, Received Radiation Psychiatric Denies history of Anorexia/bulimia, Confinement Anxiety Hospitalization/Surgery History - bil carpal tunnel release. - cardiac cath. - kyphoplasty. - ORIF patella left. - left total knee arthroplasty. - posterior lumbar fusion. - lumbar laminectomy. - posterior cervical fusion. - appendectomy. - blepharoplasty. - cholecystectomy. - bil catract extraction. - right knee replacement. - melanoma excision. - rectal surgery fissure. - vitrectomy. Medical A Surgical History Notes nd Ear/Nose/Mouth/Throat seasonal allergies Musculoskeletal myositis, myalgia, left patella fracture, bursitis of hips, lumbar compression fx, spondylolistthesis Neurologic bil carpal tunnel Oncologic skin cancers Objective Constitutional Slightly hypertensive. no acute distress. Vitals Time Taken: 9:59 AM, Height: 72 in, Weight: 198 lbs, BMI: 26.9, Temperature: 98.0 F, Pulse: 70 bpm, Respiratory Rate: 18 breaths/min, Blood Pressure: 147/74 mmHg. Respiratory Normal work of breathing on room air. General Notes: 04/18/2023: All of his wounds are healed and edema control is excellent. Integumentary (Hair, Skin) Wound #4 status is Healed - Epithelialized. Original cause of wound was Blister. The date acquired was: 03/20/2023. The wound has been in treatment 4 weeks. The wound is located on the Left,Medial Ankle. The wound measures 0cm length x 0cm width x 0cm depth; 0cm^2 area and 0cm^3 volume. There is no tunneling or undermining noted. There is a none present amount of drainage noted.  The wound margin is distinct with the outline attached to the wound base. There is large (67-100%) red granulation within the wound bed. There is no necrotic tissue within the wound bed. The periwound skin appearance had no abnormalities noted for texture. The periwound skin appearance had no abnormalities noted for moisture. The periwound skin appearance had no abnormalities noted for color. Periwound temperature was noted as No Abnormality. Wound #5 status is Healed - Epithelialized. Original cause  of wound was Gradually Appeared. The date acquired was: 03/27/2023. The wound has been in treatment 3 weeks. The wound is located on the Left,Anterior Lower Leg. The wound measures 0cm length x 0cm width x 0cm depth; 0cm^2 area and 0cm^3 volume. There is Fat Layer (Subcutaneous Tissue) exposed. There is no tunneling or undermining noted. There is a none present amount of drainage noted. The wound margin is distinct with the outline attached to the wound base. There is large (67-100%) red granulation within the wound bed. There is no necrotic tissue within the wound bed. The periwound skin appearance had no abnormalities noted for texture. The periwound skin appearance had no abnormalities noted for moisture. The periwound skin appearance had no abnormalities noted for color. Periwound temperature was noted as No Abnormality. Wound #6 status is Healed - Epithelialized. Original cause of wound was Blister. The date acquired was: 04/04/2023. The wound has been in treatment 2 weeks. The wound is located on the Left,Lateral Lower Leg. The wound measures 0cm length x 0cm width x 0cm depth; 0cm^2 area and 0cm^3 volume. There is no tunneling or undermining noted. There is a none present amount of drainage noted. The wound margin is distinct with the outline attached to the wound base. There is large (67-100%) red granulation within the wound bed. There is no necrotic tissue within the wound bed. The periwound skin  appearance had no abnormalities noted for texture. The periwound skin appearance exhibited: Dry/Scaly, Hemosiderin Staining. The periwound skin appearance did not exhibit: Maceration. Periwound temperature was noted as No Abnormality. Wound #7 status is Healed - Epithelialized. Original cause of wound was Blister. The date acquired was: 04/04/2023. The wound has been in treatment 2 weeks. The wound is located on the Right,Lateral Lower Leg. The wound measures 0cm length x 0cm width x 0cm depth; 0cm^2 area and 0cm^3 volume. There is no tunneling or undermining noted. There is a none present amount of drainage noted. The wound margin is distinct with the outline attached to the wound base. There is large (67-100%) red, pink granulation within the wound bed. There is no necrotic tissue within the wound bed. The periwound skin appearance had no abnormalities noted for texture. The periwound skin appearance had no abnormalities noted for moisture. The periwound skin appearance exhibited: Hemosiderin Staining. Periwound temperature was noted as No Abnormality. IRENEO, SICKEL ([id number]) [id number] Page 6 of 8 Assessment Active Problems ICD-10 Non-pressure chronic ulcer of other part of right lower leg with fat layer exposed Non-pressure chronic ulcer of other part of left lower leg with fat layer exposed Non-pressure chronic ulcer of left ankle limited to breakdown of skin Venous insufficiency (chronic) (peripheral) Heart failure, unspecified Localized edema Atherosclerotic heart disease of native coronary artery without angina pectoris Corns and callosities Plan Discharge From [facility] Services: Discharge from Wound Care Center - Congratulations on your healing! Call us if you need [place]!! Anesthetic: (In clinic) Topical Lidocaine 4% applied to wound bed Bathing/ Shower/ Hygiene: May shower with protection but do not get wound dressing(s) wet. Protect dressing(s) with  water repellant cover (for example, large plastic bag) or a cast cover and may then take shower. Edema Control - Lymphedema / SCD / Other: Elevate legs to the level of the heart or above for 30 minutes daily and/or when sitting for 3-4 times a day throughout the day. - throughout the day Avoid standing for long periods of time. Exercise regularly Compression stocking or Garment 20-30 mm/Hg pressure to: - left leg daily  Other Edema Control Orders/Instructions: - wear juxtalites daily 04/18/2023: All of his wounds are healed and edema control is excellent. He has his juxta lite stockings with him today and he was reminded of the importance of wearing them daily and keeping his legs elevated is much as possible. He has follow-up with vein and vascular next month to discuss saphenous vein ablation, which I think would greatly benefit him and reduce the risk of future wounding events. We will discharge him from the wound care center. He may follow-up as needed. Electronic Signature(s) Signed: 04/18/2023 10:27:50 AM By: Duanne Guess MD FACS Entered By: Duanne Guess on 04/18/2023 07:27:50 -------------------------------------------------------------------------------- HxROS Details Patient Name: Date of Service: [name], Jordan E. 04/18/2023 10:00 A M Medical Record Number: [id number] Patient Account Number: [id number] Date of Birth/Sex: Treating RN: [birth year] (89 y.o. M) Primary Care Provider: Alcide Clever Other Clinician: Referring Provider: Treating Provider/Extender: Duanne Guess [facility], MICHELLE Weeks in Treatment: 13 Information Obtained From Patient Chart Eyes Medical History: Positive for: Cataracts - bil extractions Negative for: Glaucoma; Optic Neuritis Ear/Nose/Mouth/Throat GJON, MAUS ([id number]) [phone].pdf Page 7 of 8 Medical History: Negative for: Chronic sinus problems/congestion; Middle ear problems Past Medical History  Notes: seasonal allergies Cardiovascular Medical History: Positive for: Congestive Heart Failure; Coronary Artery Disease; Hypertension; Peripheral Venous Disease Endocrine Medical History: Negative for: Type I Diabetes; Type II Diabetes Integumentary (Skin) Medical History: Negative for: History of Burn Musculoskeletal Medical History: Positive for: Osteoarthritis Past Medical History Notes: myositis, myalgia, left patella fracture, bursitis of hips, lumbar compression fx, spondylolistthesis Neurologic Medical History: Positive for: Neuropathy Past Medical History Notes: bil carpal tunnel Oncologic Medical History: Negative for: Received Chemotherapy; Received Radiation Past Medical History Notes: skin cancers Psychiatric Medical History: Negative for: Anorexia/bulimia; Confinement Anxiety HBO Extended History Items Eyes: Cataracts Immunizations Pneumococcal Vaccine: Received Pneumococcal Vaccination: No Implantable Devices No devices added Hospitalization / Surgery History Type of Hospitalization/Surgery bil carpal tunnel release cardiac cath kyphoplasty ORIF patella left left total knee arthroplasty posterior lumbar fusion lumbar laminectomy posterior cervical fusion appendectomy blepharoplasty cholecystectomy bil catract extraction right knee replacement melanoma excision rectal surgery fissure vitrectomy EXEQUIEL, Jordan E ([id number]) [id number] Page 8 of 8 Family and Social History Cancer: No; Diabetes: No; Heart Disease: No; Hereditary Spherocytosis: No; Hypertension: No; Kidney Disease: No; Lung Disease: Yes - Siblings; Seizures: No; Stroke: Yes - Siblings; Thyroid Problems: No; Tuberculosis: No; Former smoker; Marital Status - Married; Alcohol Use: Moderate; Drug Use: No History; Caffeine Use: Rarely; Financial Concerns: No; Food, Clothing or Shelter Needs: No; Support System Lacking: No; Transportation Concerns:  No Electronic Signature(s) Signed: 04/18/2023 10:28:43 AM By: Duanne Guess MD FACS Entered By: Duanne Guess on 04/18/2023 07:26:22 -------------------------------------------------------------------------------- SuperBill Details Patient Name: Date of Service: Leone Payor [name], Jordan E. 04/18/2023 Medical Record Number: [id number] Patient Account Number: [id number] Date of Birth/Sex: Treating RN: [birth year] (89 y.o. [name] Primary Care Provider: Alcide Clever Other Clinician: Referring Provider: Treating Provider/Extender: Duanne Guess [facility], MICHELLE Weeks in Treatment: 13 Diagnosis Coding ICD-10 Codes Code Description [phone] Non-pressure chronic ulcer of other part of right lower leg with fat layer exposed L97.822 Non-pressure chronic ulcer of other part of left lower leg with fat layer exposed L97.321 Non-pressure chronic ulcer of left ankle limited to breakdown of skin I87.2 Venous insufficiency (chronic) (peripheral) I50.9 Heart failure, unspecified R60.0 Localized edema I25.10 Atherosclerotic heart disease of native coronary artery without angina pectoris L84 Corns and callosities Facility Procedures : CPT4 Code: [id number] Description: 69485 - WOUND CARE VISIT-LEV 2 EST  PT Modifier: 25 Quantity: 1 Physician Procedures : CPT4 Code Description Modifier [id number] - WC PHYS LEVEL 3 - EST PT ICD-10 Diagnosis Description 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ed L97.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

## 2023-04-22 ENCOUNTER — Other Ambulatory Visit: Payer: Self-pay

## 2023-04-22 ENCOUNTER — Emergency Department (HOSPITAL_COMMUNITY)
Admission: EM | Admit: 2023-04-22 | Discharge: 2023-04-22 | Disposition: A | Discharge status: Home / Self Care | Payer: Medicare Other | Attending: Emergency Medicine | Admitting: Emergency Medicine

## 2023-04-22 ENCOUNTER — Emergency Department (HOSPITAL_COMMUNITY): Payer: Medicare Other

## 2023-04-22 ENCOUNTER — Encounter (HOSPITAL_COMMUNITY): Payer: Self-pay | Admitting: Emergency Medicine

## 2023-04-22 DIAGNOSIS — I509 Heart failure, unspecified: Secondary | ICD-10-CM | POA: Diagnosis not present

## 2023-04-22 DIAGNOSIS — I2510 Atherosclerotic heart disease of native coronary artery without angina pectoris: Secondary | ICD-10-CM | POA: Diagnosis not present

## 2023-04-22 DIAGNOSIS — W19XXXA Unspecified fall, initial encounter: Secondary | ICD-10-CM

## 2023-04-22 DIAGNOSIS — I110 Hypertensive heart disease with heart failure: Secondary | ICD-10-CM | POA: Insufficient documentation

## 2023-04-22 DIAGNOSIS — Y92000 Kitchen of unspecified non-institutional (private) residence as  the place of occurrence of the external cause: Secondary | ICD-10-CM | POA: Diagnosis not present

## 2023-04-22 DIAGNOSIS — R600 Localized edema: Secondary | ICD-10-CM | POA: Insufficient documentation

## 2023-04-22 DIAGNOSIS — Z7901 Long term (current) use of anticoagulants: Secondary | ICD-10-CM | POA: Diagnosis not present

## 2023-04-22 DIAGNOSIS — Z7982 Long term (current) use of aspirin: Secondary | ICD-10-CM | POA: Insufficient documentation

## 2023-04-22 DIAGNOSIS — W1839XA Other fall on same level, initial encounter: Secondary | ICD-10-CM | POA: Diagnosis not present

## 2023-04-22 DIAGNOSIS — S22079A Unspecified fracture of T9-T10 vertebra, initial encounter for closed fracture: Secondary | ICD-10-CM | POA: Diagnosis not present

## 2023-04-22 DIAGNOSIS — R519 Headache, unspecified: Secondary | ICD-10-CM | POA: Diagnosis not present

## 2023-04-22 DIAGNOSIS — Y93G3 Activity, cooking and baking: Secondary | ICD-10-CM | POA: Insufficient documentation

## 2023-04-22 DIAGNOSIS — R0789 Other chest pain: Secondary | ICD-10-CM | POA: Diagnosis not present

## 2023-04-22 DIAGNOSIS — S299XXA Unspecified injury of thorax, initial encounter: Secondary | ICD-10-CM | POA: Diagnosis present

## 2023-04-22 DIAGNOSIS — Z79899 Other long term (current) drug therapy: Secondary | ICD-10-CM | POA: Diagnosis not present

## 2023-04-22 DIAGNOSIS — R109 Unspecified abdominal pain: Secondary | ICD-10-CM | POA: Insufficient documentation

## 2023-04-22 LAB — CBC WITH DIFFERENTIAL/PLATELET
Abs Immature Granulocytes: 0.05 10*3/uL (ref 0.00–0.07)
Basophils Absolute: 0.1 10*3/uL (ref 0.0–0.1)
Basophils Relative: 0 %
Eosinophils Absolute: 0.2 10*3/uL (ref 0.0–0.5)
Eosinophils Relative: 1 %
HCT: 36.4 % — ABNORMAL LOW (ref 39.0–52.0)
Hemoglobin: 11.3 g/dL — ABNORMAL LOW (ref 13.0–17.0)
Immature Granulocytes: 0 %
Lymphocytes Relative: 11 %
Lymphs Abs: 1.7 10*3/uL (ref 0.7–4.0)
MCH: 27 pg (ref 26.0–34.0)
MCHC: 31 g/dL (ref 30.0–36.0)
MCV: 86.9 fL (ref 80.0–100.0)
Monocytes Absolute: 1 10*3/uL (ref 0.1–1.0)
Monocytes Relative: 7 %
Neutro Abs: 12.2 10*3/uL — ABNORMAL HIGH (ref 1.7–7.7)
Neutrophils Relative %: 81 %
Platelets: 446 10*3/uL — ABNORMAL HIGH (ref 150–400)
RBC: 4.19 MIL/uL — ABNORMAL LOW (ref 4.22–5.81)
RDW: 14.8 % (ref 11.5–15.5)
WBC: 15.2 10*3/uL — ABNORMAL HIGH (ref 4.0–10.5)
nRBC: 0 % (ref 0.0–0.2)

## 2023-04-22 LAB — URINALYSIS, ROUTINE W REFLEX MICROSCOPIC
Bacteria, UA: NONE SEEN
Bilirubin Urine: NEGATIVE
Glucose, UA: NEGATIVE mg/dL
Ketones, ur: 20 mg/dL — AB
Leukocytes,Ua: NEGATIVE
Nitrite: NEGATIVE
Protein, ur: NEGATIVE mg/dL
Specific Gravity, Urine: 1.009 (ref 1.005–1.030)
pH: 7 (ref 5.0–8.0)

## 2023-04-22 LAB — BASIC METABOLIC PANEL
Anion gap: 12 (ref 5–15)
BUN: 15 mg/dL (ref 8–23)
CO2: 21 mmol/L — ABNORMAL LOW (ref 22–32)
Calcium: 8.5 mg/dL — ABNORMAL LOW (ref 8.9–10.3)
Chloride: 103 mmol/L (ref 98–111)
Creatinine, Ser: 0.88 mg/dL (ref 0.61–1.24)
GFR, Estimated: >60 mL/min (ref >60)
Glucose, Bld: 110 mg/dL — ABNORMAL HIGH (ref 70–99)
Potassium: 2.9 mmol/L — ABNORMAL LOW (ref 3.5–5.1)
Sodium: 136 mmol/L (ref 135–145)

## 2023-04-22 LAB — TROPONIN I (HIGH SENSITIVITY)
Troponin I (High Sensitivity): 13 ng/L (ref <18)
Troponin I (High Sensitivity): 15 ng/L (ref <18)

## 2023-04-22 LAB — HEPATIC FUNCTION PANEL
ALT: 18 U/L (ref 0–44)
AST: 24 U/L (ref 15–41)
Albumin: 3.6 g/dL (ref 3.5–5.0)
Alkaline Phosphatase: 86 U/L (ref 38–126)
Bilirubin, Direct: 0.3 mg/dL — ABNORMAL HIGH (ref 0.0–0.2)
Indirect Bilirubin: 0.8 mg/dL (ref 0.3–0.9)
Total Bilirubin: 1.1 mg/dL (ref 0.3–1.2)
Total Protein: 6.7 g/dL (ref 6.5–8.1)

## 2023-04-22 LAB — CK: Total CK: 207 U/L (ref 49–397)

## 2023-04-22 LAB — MAGNESIUM: Magnesium: 1.8 mg/dL (ref 1.7–2.4)

## 2023-04-22 MED ORDER — KETOROLAC TROMETHAMINE 15 MG/ML IJ SOLN
15.0000 mg | Freq: Once | INTRAMUSCULAR | Status: AC
  Administered 2023-04-22: 15 mg via INTRAVENOUS
  Filled 2023-04-22: qty 1

## 2023-04-22 MED ORDER — LIDOCAINE 5 % EX PTCH
1.0000 | MEDICATED_PATCH | CUTANEOUS | Status: DC
  Administered 2023-04-22: 1 via TRANSDERMAL
  Filled 2023-04-22: qty 1

## 2023-04-22 MED ORDER — METOCLOPRAMIDE HCL 5 MG/ML IJ SOLN
10.0000 mg | Freq: Once | INTRAMUSCULAR | Status: AC
  Administered 2023-04-22: 10 mg via INTRAVENOUS
  Filled 2023-04-22: qty 2

## 2023-04-22 MED ORDER — ONDANSETRON HCL 4 MG/2ML IJ SOLN
4.0000 mg | Freq: Once | INTRAMUSCULAR | Status: AC
  Administered 2023-04-22: 4 mg via INTRAVENOUS
  Filled 2023-04-22: qty 2

## 2023-04-22 MED ORDER — LIDOCAINE 4 % EX PTCH: 1.0000 | MEDICATED_PATCH | CUTANEOUS | 0 refills | Status: DC

## 2023-04-22 MED ORDER — HYDROMORPHONE HCL 1 MG/ML IJ SOLN
0.5000 mg | Freq: Once | INTRAMUSCULAR | Status: AC
  Administered 2023-04-22: 0.5 mg via INTRAVENOUS
  Filled 2023-04-22: qty 1

## 2023-04-22 MED ORDER — OXYCODONE-ACETAMINOPHEN 5-325 MG PO TABS
2.0000 | ORAL_TABLET | Freq: Three times a day (TID) | ORAL | 0 refills | Status: AC | PRN
Start: 2023-04-22 — End: 2023-04-27

## 2023-04-22 MED ORDER — POTASSIUM CHLORIDE CRYS ER 20 MEQ PO TBCR: 20.0000 meq | EXTENDED_RELEASE_TABLET | Freq: Two times a day (BID) | ORAL | 0 refills | Status: DC

## 2023-04-22 MED ORDER — POTASSIUM CHLORIDE CRYS ER 20 MEQ PO TBCR
40.0000 meq | EXTENDED_RELEASE_TABLET | Freq: Once | ORAL | Status: AC
  Administered 2023-04-22: 40 meq via ORAL
  Filled 2023-04-22: qty 2

## 2023-04-22 MED ORDER — OXYCODONE-ACETAMINOPHEN 5-325 MG PO TABS
2.0000 | ORAL_TABLET | Freq: Once | ORAL | Status: AC
  Administered 2023-04-22: 2 via ORAL
  Filled 2023-04-22: qty 2

## 2023-04-22 MED ORDER — METHOCARBAMOL 500 MG PO TABS
500.0000 mg | ORAL_TABLET | Freq: Once | ORAL | Status: AC
  Administered 2023-04-22: 500 mg via ORAL
  Filled 2023-04-22: qty 1

## 2023-04-22 MED ORDER — LACTATED RINGERS IV BOLUS
500.0000 mL | Freq: Once | INTRAVENOUS | Status: AC
  Administered 2023-04-22: 500 mL via INTRAVENOUS

## 2023-04-22 NOTE — ED Provider Notes (Signed)
[place] EMERGENCY DEPARTMENT AT [facility] Provider Note   CSN: [id number] Arrival date & time: 04/22/23  4403     History  Chief Complaint  Patient presents with   Fall   Loss of Consciousness    [name] is a 89 y.o. male.   Fall Associated symptoms include chest pain.  Loss of Consciousness Associated symptoms: chest pain   Patient presents after fall.  Medical history includes HLD, HTN, CAD, chronic low back pain, BPH, anemia, arthritis, CHF, prior PE.  He takes Eliquis, the last of which was this morning.  He has chronic pain and typically takes 10 mg oxycodone every 6 hours.  His last dose of this was 2 days ago.  He reports that his primary care doctor has plans to take him off of oxycodone and started him on Suboxone.  He has not yet made the switch.  2 days ago, at approximately 6 PM, he was standing in the kitchen cooking dinner.  He felt his legs get weak.  He remembers falling backwards.  He thinks he likely passed out while on the floor.  He has since had worsened pain in his back.  He describes the location as L1 area.  He also has right lower lateral chest wall pain and is concerned of a broken rib.  He denies any other areas of discomfort.       Home Medications Prior to Admission medications   Medication Sig Start Date End Date Taking? Authorizing Provider  lidocaine 4 % Place 1 patch onto the skin daily. 04/22/23  Yes Gloris Manchester, MD  oxyCODONE-acetaminophen (PERCOCET/ROXICET) 5-325 MG tablet Take 2 tablets by mouth every 8 (eight) hours as needed for up to 5 days for severe pain. 04/22/23 04/27/23 Yes Gloris Manchester, MD  potassium chloride SA (KLOR-CON M) 20 MEQ tablet Take 1 tablet (20 mEq total) by mouth 2 (two) times daily for 2 days. 04/22/23 04/24/23 Yes Gloris Manchester, MD  amLODipine (NORVASC) 5 MG tablet TAKE 1 TABLET (5 MG TOTAL) BY MOUTH DAILY. 12/17/22 01/16/23  Myrlene Broker, MD  aspirin EC 81 MG tablet Take 1 tablet (81 mg total) by mouth  daily. Swallow whole. 06/22/22   Tyrone Nine, MD  clotrimazole (LOTRIMIN) 1 % cream APPLY TO AFFECTED AREA TWICE A DAY Patient taking differently: Apply 1 Application topically 2 (two) times daily as needed (for dryness). 06/13/22   Pincus Sanes, MD  ELIQUIS 5 MG TABS tablet TAKE 1 TABLET BY MOUTH TWICE A DAY 03/01/23   Myrlene Broker, MD  fluticasone [facility]) 50 MCG/ACT nasal spray Place 1 spray into both nostrils daily as needed for allergies.    [provider]  furosemide (LASIX) 20 MG tablet Take 1 tablet (20 mg total) by mouth daily. 07/18/22   Myrlene Broker, MD  isosorbide mononitrate (IMDUR) 30 MG 24 hr tablet TAKE 1 TABLET BY MOUTH EVERY DAY 12/31/22   Myrlene Broker, MD  losartan (COZAAR) 50 MG tablet Take 1 tablet (50 mg total) by mouth daily. 07/18/22   Myrlene Broker, MD  metoprolol succinate (TOPROL-XL) 50 MG 24 hr tablet TAKE 1 TABLET BY MOUTH EVERY DAY 03/01/23   Myrlene Broker, MD  Oxycodone HCl 10 MG TABS Take 10-20 mg by mouth See admin instructions. Take 20 mg by mouth in the morning and evening. May take an additional 10 mg once a day around noontime as needed for unresolved pain.    [provider]  SYMPROIC 0.2 MG TABS Take 0.2 mg by mouth in the morning.    [provider]  tamsulosin (FLOMAX) 0.4 MG CAPS capsule TAKE 1 CAPSULE BY MOUTH EVERY DAY Patient taking differently: Take 0.4 mg by mouth every evening. 11/15/22   Myrlene Broker, MD  triamcinolone cream (KENALOG) 0.1 % Apply 1 Application topically 2 (two) times daily as needed (rough/irritated skin). 12/18/22   [provider]      Allergies    Amlodipine, Lyrica [pregabalin], and Gabapentin    Review of Systems   Review of Systems  Cardiovascular:  Positive for chest pain and syncope.  Musculoskeletal:  Positive for back pain.  Neurological:  Positive for syncope.  All other systems reviewed and are negative.   Physical Exam Updated  Vital Signs BP (!) 176/96 Comment: nurse notified  Pulse 74   Temp 98.3 F (36.8 C) (Oral)   Resp (!) 22   Ht 6' (1.829 m)   Wt 85.3 kg   SpO2 94%   BMI 25.50 kg/m  Physical Exam Vitals and nursing note reviewed.  Constitutional:      General: He is not in acute distress.    Appearance: Normal appearance. He is well-developed. He is not ill-appearing, toxic-appearing or diaphoretic.  HENT:     Head: Normocephalic and atraumatic.     Right Ear: External ear normal.     Left Ear: External ear normal.     Nose: Nose normal.     Mouth/Throat:     Mouth: Mucous membranes are moist.  Eyes:     Extraocular Movements: Extraocular movements intact.     Conjunctiva/sclera: Conjunctivae normal.  Cardiovascular:     Rate and Rhythm: Normal rate and regular rhythm.     Heart sounds: No murmur heard. Pulmonary:     Effort: Pulmonary effort is normal. No respiratory distress.     Breath sounds: Normal breath sounds. No wheezing or rales.  Chest:     Chest wall: Tenderness present.  Abdominal:     General: There is no distension.     Palpations: Abdomen is soft.     Tenderness: There is no abdominal tenderness.  Musculoskeletal:        General: No swelling. Normal range of motion.     Cervical back: Normal range of motion and neck supple.     Right lower leg: Edema present.     Left lower leg: Edema present.     Comments: BLE edema described as baseline  Skin:    General: Skin is warm and dry.     Coloration: Skin is not jaundiced or pale.  Neurological:     General: No focal deficit present.     Mental Status: He is alert and oriented to person, place, and time.  Psychiatric:        Mood and Affect: Mood normal.        Behavior: Behavior normal.     ED Results / Procedures / Treatments   Labs (all labs ordered are listed, but only abnormal results are displayed) Labs Reviewed  CBC WITH DIFFERENTIAL/PLATELET - Abnormal; Notable for the following components:      Result  Value   WBC 15.2 (*)    RBC 4.19 (*)    Hemoglobin 11.3 (*)    HCT 36.4 (*)    Platelets 446 (*)    Neutro Abs 12.2 (*)    All other components within normal limits  BASIC METABOLIC PANEL - Abnormal; Notable  for the following components:   Potassium 2.9 (*)    CO2 21 (*)    Glucose, Bld 110 (*)    Calcium 8.5 (*)    All other components within normal limits  HEPATIC FUNCTION PANEL - Abnormal; Notable for the following components:   Bilirubin, Direct 0.3 (*)    All other components within normal limits  URINALYSIS, ROUTINE W REFLEX MICROSCOPIC - Abnormal; Notable for the following components:   Color, Urine STRAW (*)    Hgb urine dipstick MODERATE (*)    Ketones, ur 20 (*)    All other components within normal limits  MAGNESIUM  CK  TROPONIN I (HIGH SENSITIVITY)  TROPONIN I (HIGH SENSITIVITY)    EKG EKG Interpretation Date/Time:  Monday April 22 2023 07:57:06 EDT Ventricular Rate:  74 PR Interval:  197 QRS Duration:  114 QT Interval:  395 QTC Calculation: 439 R Axis:   82  Text Interpretation: Sinus rhythm Ventricular bigeminy Borderline intraventricular conduction delay Minimal ST depression, lateral leads Confirmed by Gloris Manchester ([id number]) on 04/22/2023 8:06:56 AM  Radiology CT L-SPINE NO CHARGE  Result Date: 04/22/2023 CLINICAL DATA:  89 year old male with syncope and fall backwards 2 days ago. Pain. On Eliquis. Vomiting. EXAM: CT LUMBAR SPINE WITHOUT CONTRAST TECHNIQUE: Technique: Multiplanar CT images of the lumbar spine were reconstructed from contemporary CT of the Abdomen and Pelvis. RADIATION DOSE REDUCTION: This exam was performed according to the departmental dose-optimization program which includes automated exposure control, adjustment of the mA and/or kV according to patient size and/or use of iterative reconstruction technique. CONTRAST:  None COMPARISON:  CT thoracic, CT Chest, Abdomen, and Pelvis today reported separately. 04/27/2020. CT Abdomen and Pelvis  FINDINGS: Segmentation: Transitional. When numbering in concordance with the thoracic spine today there is partial lumbarization of S1, and posterior chronic spinal fusion hardware is in place at S1-S2. Correlation with radiographs is recommended prior to any operative intervention. Alignment: Not significantly changed from 2021 CT Abdomen and Pelvis. Vertebrae: Lower thoracic spine is detailed separately today. Augmented lumbar compression fractures L1 through L4 are mostly new from the 2021 CT. There is progressed and severe disc and endplate abnormality at L4-L5 since 2021, including subtotal L5 superior endplate and body erosion. But this has a chronic appearance, with no surrounding acute soft tissue inflammation. There is gas phenomena both in the intervening abnormal disc space and within the residual L5 body suggesting avascular necrosis. S1-S2 fusion and the visible sacrum appear stable since 2021. Paraspinal and other soft tissues: No acute lumbar paraspinal soft tissue finding. Abdomen and pelvis are detailed separately. Disc levels: Abnormal throughout, although most levels stable since 2021. Multifactorial spinal stenosis at L3-L4 may have progressed since 2021, mild-to-moderate. Chronic L4-L5 spinal, lateral recess, and severe L4 foraminal stenosis is probably not significantly progressed despite extensive progressive endplate and vertebral changes at that level since 2021. Solid L5-S1 and S1-S2 arthrodesis. IMPRESSION: 1.  No acute traumatic injury identified in the Lumbar Spine. 2. However, new vertebral abnormalities throughout the lumbar spine since the most recent CT in 2021, including: - previously augmented L1 through L4 compression fractures. - appearance of very severe progressed disc and endplate degeneration at L4-L5, or alternatively sequelae of chronic discitis-osteomyelitis at that level. 3.  CT Abdomen and Pelvis today are reported separately. Electronically Signed   By: Odessa Fleming M.D.    On: 04/22/2023 10:38   CT T-SPINE NO CHARGE  Result Date: 04/22/2023 CLINICAL DATA:  89 year old male with syncope and fall backwards 2  days ago. Pain. On Eliquis. Vomiting. EXAM: CT THORACIC SPINE WITHOUT CONTRAST TECHNIQUE: Multiplanar CT images of the thoracic spine were reconstructed from contemporary CT of the Chest. RADIATION DOSE REDUCTION: This exam was performed according to the departmental dose-optimization program which includes automated exposure control, adjustment of the mA and/or kV according to patient size and/or use of iterative reconstruction technique. CONTRAST:  None COMPARISON:  CT Chest, Abdomen, and Pelvis today are reported separately. Chest CTA 07/24/2022. FINDINGS: Limited cervical spine imaging: CT cervical spine is reported separately today. There is posterior C7-T1 hardware in place. Maintained cervicothoracic junction. Thoracic spine segmentation:  Normal. Alignment: Generally stable thoracic kyphosis since the CTA last year. Vertebrae: Osteopenia. T1 and T2 remain intact. Mild chronic T3 compression fracture is stable. T4 and T5 remain intact. Chronic T6 through T8 interbody ankylosis from anterior flowing endplate osteophytes. Those levels appears stable and intact. T8-T9 bulky chronic anterior endplate osteophytes but no solid bridging bone. Acute fracture through the anterior inferior T9 vertebral body which is chronically ankylosed with T10 from flowing anterior endplate osteophytes. There is mild posttraumatic lordosis at that level (about 11 degrees). No spondylolisthesis. Traumatic bilateral T9 neural foraminal stenosis is moderate to severe. But the T9 and T10 pedicles and posterior elements appear to remain intact. The fracture does not appear to extend into the T10 body. Chronic T10-T11 bulky anterior endplate osteophyte but no ankylosis. Chronic T11 and T12 compression fractures appear stable. Paraspinal and other soft tissues: Ventral prevertebral edema/contusion at  T9-T10 (series 10, image 111). Other thoracic paraspinal soft tissues appears stable. Chest and abdominal viscera detailed separately. Disc levels: No CT evidence of thoracic spinal stenosis. IMPRESSION: 1. Acute fracture through the partially Ankylosed Spine: Fractured and mildly distracted anterior inferior T9 body and bridging osteophyte. Posterior elements and the T10 level remain intact. There is mild posttraumatic lordosis of T9 on T10 (11 degrees) without spondylolisthesis. But there is subsequent moderate to severe new T9 neural foraminal stenosis. 2. No CT evidence of thoracic spinal stenosis. And other thoracic levels are stable, including chronic compression fractures of T3, T11, T12. 3.  CT Chest, Abdomen, and Pelvis today reported separately. Electronically Signed   By: Odessa Fleming M.D.   On: 04/22/2023 10:29   CT Cervical Spine Wo Contrast  Result Date: 04/22/2023 CLINICAL DATA:  Syncopal episode 2 days ago. Patient fell backwards. EXAM: CT CERVICAL SPINE WITHOUT CONTRAST TECHNIQUE: Multidetector CT imaging of the cervical spine was performed without intravenous contrast. Multiplanar CT image reconstructions were also generated. RADIATION DOSE REDUCTION: This exam was performed according to the departmental dose-optimization program which includes automated exposure control, adjustment of the mA and/or kV according to patient size and/or use of iterative reconstruction technique. COMPARISON:  MRI of the cervical spine 06/16/2016 at [facility]. FINDINGS: Alignment: Slight degenerative anterolisthesis C5-6 is stable. Lumbar lordosis is preserved. Skull base and vertebrae: The craniocervical junction is normal. The vertebral body heights are normal. No acute fractures are present. Soft tissues and spinal canal: No prevertebral fluid or swelling. No visible canal hematoma. Disc levels: Asymmetric facet hypertrophy is present on the left C4-5 and C5-6 with associated foraminal stenosis. Moderate facet disease is  present bilaterally at C3-4 with left greater than right foraminal narrowing. Upper chest: The lung apices are clear. The thoracic inlet is within normal limits. IMPRESSION: 1. No acute fracture or traumatic subluxation. 2. Multilevel degenerative changes of the cervical spine are stable. 3. Asymmetric facet hypertrophy on the left at C4-5 and C5-6 with associated foraminal  stenosis. 4. Moderate facet disease bilaterally at C3-4 with left greater than right foraminal narrowing. Electronically Signed   By: Marin Roberts M.D.   On: 04/22/2023 10:20   CT CHEST ABDOMEN PELVIS WO CONTRAST  Result Date: 04/22/2023 CLINICAL DATA:  89 year old male with syncope and fall backwards 2 days ago. Pain. On Eliquis. Vomiting. EXAM: CT CHEST, ABDOMEN AND PELVIS WITHOUT CONTRAST TECHNIQUE: Multidetector CT imaging of the chest, abdomen and pelvis was performed following the standard protocol without IV contrast. RADIATION DOSE REDUCTION: This exam was performed according to the departmental dose-optimization program which includes automated exposure control, adjustment of the mA and/or kV according to patient size and/or use of iterative reconstruction technique. COMPARISON:  Thoracic and lumbar spine CT today are reported separately. Prior CTA chest 07/24/2022. CT Abdomen and Pelvis 04/27/2020. FINDINGS: CT CHEST FINDINGS Cardiovascular: Calcified aortic atherosclerosis. Calcified coronary artery atherosclerosis. Cardiomegaly has regressed since last year. No pericardial effusion. Vascular patency is not evaluated in the absence of IV contrast. Mediastinum/Nodes: Posterior mediastinal/prevertebral edema or contusion in the lower thoracic spine. See thoracic CT detailed separately. No other mediastinal hematoma, mass, lymphadenopathy. Lungs/Pleura: Major airways are patent. Improved lung volumes compared to last year. Mild lower lobe and prevertebral atelectasis. No pneumothorax. No pleural effusion. No pulmonary  contusion. Musculoskeletal: Abnormal thoracic spine is detailed separately. Degenerated but intact visible shoulder osseous structures. No sternal fracture. No rib fracture identified. CT ABDOMEN PELVIS FINDINGS Hepatobiliary: Noncontrast liver appears stable with no perihepatic fluid. Chronic cholecystectomy and biliary ductal enlargement. Pancreas: Stable pancreatic atrophy. Spleen: Noncontrast spleen appears stable and intact. No perisplenic fluid identified. Adrenals/Urinary Tract: Stable, negative noncontrast appearance. Stomach/Bowel: Redundant but otherwise negative large bowel. Nondilated small bowel, stomach. No free air or free fluid. Vascular/Lymphatic: Aortoiliac calcified atherosclerosis. Normal caliber abdominal aorta. Vascular patency is not evaluated in the absence of IV contrast. No lymphadenopathy. Reproductive: Negative. Other: Trace simple density pelvis free fluid on series 4, image 110. Musculoskeletal: Lumbar spine today is detailed separately. Chronic postoperative changes to the posterior iliac bone. Stable and intact visible sacrum, SI joints, pelvis, proximal femurs. No superficial soft tissue injury identified. IMPRESSION: 1. Abnormal Thoracic Spine, See Spine CT today reported separately. 2. Trace simple density pelvis free fluid is nonspecific but no other acute traumatic injury identified in the noncontrast chest, abdomen, or pelvis. 3.  Aortic Atherosclerosis (ICD10-I70.0). Electronically Signed   By: Odessa Fleming M.D.   On: 04/22/2023 10:20   CT Head Wo Contrast  Result Date: 04/22/2023 CLINICAL DATA:  Syncope/presyncope, cerebrovascular cause suspected. Fall associated with syncopal episode 2 days ago. EXAM: CT HEAD WITHOUT CONTRAST TECHNIQUE: Contiguous axial images were obtained from the base of the skull through the vertex without intravenous contrast. RADIATION DOSE REDUCTION: This exam was performed according to the departmental dose-optimization program which includes automated  exposure control, adjustment of the mA and/or kV according to patient size and/or use of iterative reconstruction technique. COMPARISON:  CT head without contrast 01/11/2021 FINDINGS: Brain: No acute infarct, hemorrhage, or mass lesion is present. Mild generalized atrophy is within normal limits for age. Deep brain nuclei are within normal limits. The ventricles are proportionate to the degree of atrophy. No significant extraaxial fluid collection is present. The brainstem and cerebellum are within normal limits. Vascular: Atherosclerotic calcifications are present within the cavernous internal carotid arteries bilaterally and at the dural margin of both vertebral arteries. No hyperdense vessels are present. Skull: Mild soft tissue stranding is present in neck simple and posterior parietal scalp, left greater  than right. No underlying fracture or foreign body is present. Calvarium is intact. No focal lytic or blastic lesions are present. Sinuses/Orbits: Chronic bilateral maxillary sinus disease is present. The paranasal sinuses and mastoid air cells are otherwise clear. Bilateral lens replacements are noted. Globes and orbits are otherwise unremarkable. IMPRESSION: 1. Mild soft tissue stranding in the neck simple and posterior parietal scalp, left greater than right. This likely reflects contusion. No underlying fracture or foreign body. 2. Normal CT appearance of the brain for age. 3. Chronic bilateral maxillary sinus disease. Electronically Signed   By: Marin Roberts M.D.   On: 04/22/2023 10:18    Procedures Procedures    Medications Ordered in ED Medications  lidocaine (LIDODERM) 5 % 1 patch (1 patch Transdermal Patch Applied 04/22/23 0814)  oxyCODONE-acetaminophen (PERCOCET/ROXICET) 5-325 MG per tablet 2 tablet (2 tablets Oral Given 04/22/23 0734)  ondansetron (ZOFRAN) injection 4 mg (4 mg Intravenous Given 04/22/23 0813)  HYDROmorphone (DILAUDID) injection 0.5 mg (0.5 mg Intravenous Given 04/22/23  0813)  potassium chloride SA (KLOR-CON M) CR tablet 40 mEq (40 mEq Oral Given 04/22/23 0949)  metoCLOPramide (REGLAN) injection 10 mg (10 mg Intravenous Given 04/22/23 0854)  lactated ringers bolus 500 mL (0 mLs Intravenous Stopped 04/22/23 0950)  ketorolac (TORADOL) 15 MG/ML injection 15 mg (15 mg Intravenous Given 04/22/23 1102)  methocarbamol (ROBAXIN) tablet 500 mg (500 mg Oral Given 04/22/23 1103)  ondansetron (ZOFRAN) injection 4 mg (4 mg Intravenous Given 04/22/23 1102)    ED Course/ Medical Decision Making/ A&P                                 Medical Decision Making Amount and/or Complexity of Data Reviewed Labs: ordered. Radiology: ordered.  Risk OTC drugs. Prescription drug management.   This patient presents to the ED for concern of fall, this involves an extensive number of treatment options, and is a complaint that carries with it a high risk of complications and morbidity.  The differential diagnosis includes acute injuries   Co morbidities that complicate the patient evaluation  HLD, HTN, CAD, chronic low back pain, BPH, anemia, arthritis, CHF, prior PE   Additional history obtained:  Additional history obtained from N/A External records from outside source obtained and reviewed including EMR   Lab Tests:  I Ordered, and personally interpreted labs.  The pertinent results include: Leukocytosis is present.  Anemia is baseline.  Kidney function is normal.  Hypokalemia is present with otherwise normal electrolytes.  Troponin and CK are normal.   Imaging Studies ordered:  I ordered imaging studies including CT of head, cervical spine, chest, abdomen, pelvis, T-spine, L-spine I independently visualized and interpreted imaging which showed posterior scalp contusion, trace simple pelvic free fluid, acute T9 fracture I agree with the radiologist interpretation   Cardiac Monitoring: / EKG:  The patient was maintained on a cardiac monitor.  I personally viewed and  interpreted the cardiac monitored which showed an underlying rhythm of: Sinus rhythm  Problem List / ED Course / Critical interventions / Medication management  Patient presents for persistent pain following a recent fall.  Fall occurred 2 days ago.  He describes the episode as near syncopal symptoms causing the fall and possible syncopal episode while on the floor.  He describes severe pain in back, lower thoracic region, as well as right lower lateral ribs.  Lower extremities are neurovascularly intact.  Of note, he does take chronic narcotic pain  medication and ran out 2 days ago.  Severe pain likely exacerbated by opiate withdrawal.  Oxycodone was ordered in the ED for analgesia.  Workup was initiated to assess for acute injuries.  Additionally, there is a questionable syncopal episode that occurred.  Will keep on cardiac monitor and extend workup.  Imaging studies showed an acute T9 fracture.  TLSO brace was ordered.  Patient was given additional pain medication for ongoing pain.  His pain did improve and he received TLSO brace.  He was advised to wear this when up and out of bed.  He was advised to follow-up with his spine doctor as well as his chronic pain management doctor.  Short course of oxycodone was provided.  Patient was discharged in stable condition. I ordered medication including lidocaine patch, Percocet, Dilaudid, Toradol, and Robaxin for analgesia; Zofran and Reglan for nausea; IV fluids for hydration; potassium chloride for hypokalemia Reevaluation of the patient after these medicines showed that the patient improved I have reviewed the patients home medicines and have made adjustments as needed   Social Determinants of Health:  Lives at home with wife, who is bedridden.  Has access to outpatient care         Final Clinical Impression(s) / ED Diagnoses Final diagnoses:  Fall, initial encounter  Closed fracture of ninth thoracic vertebra, unspecified fracture morphology,  initial encounter [facility])    Rx / DC Orders ED Discharge Orders          Ordered    oxyCODONE-acetaminophen (PERCOCET/ROXICET) 5-325 MG tablet  Every 8 hours PRN        04/22/23 1324    lidocaine 4 %  Every 24 hours        04/22/23 1324    potassium chloride SA (KLOR-CON M) 20 MEQ tablet  2 times daily        04/22/23 1328              Gloris Manchester, MD 04/22/23 1331

## 2023-04-22 NOTE — ED Notes (Signed)
Ortho contacted for TSLO brace.

## 2023-04-22 NOTE — Discharge Instructions (Addendum)
You have a new fracture in T9 vertebra.  Wear brace when up and out of bed.  Take pain medication only as needed.  Follow-up with your pain doctor as well as your spine doctor as soon as possible.  Return to the emergency department for any new or worsening symptoms of concern.  Additionally, your potassium was low.  You received a supplement while in the emergency department.  Continue prescribed potassium supplement for the next 2 days.

## 2023-04-22 NOTE — ED Triage Notes (Signed)
Pt c/o having a syncopal episode while cooking saturday around 1800, states that he fell backwards and is c/o lower back pain and right sided rib pain. Unknown if hit his head and takes eliquis. Emesis x 2

## 2023-04-22 NOTE — Progress Notes (Signed)
Orthopedic Tech Progress Note Patient Details:  [name] 1931-12-11 [id number]  Ortho Devices Type of Ortho Device: Thoracolumbar corset (TLSO) Ortho Device/Splint Interventions: Ordered, Application, Adjustment   Post Interventions Patient Tolerated: Fair Instructions Provided: Adjustment of device, Care of device  Tonye Pearson 04/22/2023, 12:33 PM

## 2023-04-24 ENCOUNTER — Other Ambulatory Visit: Payer: Self-pay | Admitting: Internal Medicine

## 2023-05-01 ENCOUNTER — Ambulatory Visit (HOSPITAL_COMMUNITY)
Admission: RE | Admit: 2023-05-01 | Discharge: 2023-05-01 | Disposition: A | Discharge status: Home / Self Care | Payer: Medicare Other | Source: Ambulatory Visit | Attending: Gastroenterology | Admitting: Gastroenterology

## 2023-05-01 DIAGNOSIS — R1310 Dysphagia, unspecified: Secondary | ICD-10-CM | POA: Diagnosis present

## 2023-05-07 ENCOUNTER — Other Ambulatory Visit: Payer: Self-pay

## 2023-05-07 DIAGNOSIS — R1310 Dysphagia, unspecified: Secondary | ICD-10-CM

## 2023-05-07 NOTE — Progress Notes (Signed)
Mr. 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  This represents a stiffening of the muscle at the top of your esophagus which can sometimes cause symptoms of difficulty swallowing.  A small narrowing was also seen at the end your esophagus, likely related to a history of acid reflux.  Neither of these narrowings caused the barium tablet to get held up. In addition, you had some dysmotility of the esophagus, which is when the esophagus doesn't squeeze and relax effectively.  Finally, you had evidence of aspiration (liquid going into the airway instead of the esophagus) when you swallow.  This places you at risk for getting aspiration pneumonia.  I recommend you meet with a speech pathologist (experts in swallowing disorders) for further evaluation and to provide advice on how to reduce your risk of aspiration pneumonia.  Although dilation of the cricopharngeal bar and esophageal stricture may be of some benefit, you have other issues with swallowing (dysmotility, aspiration) that may not be treated with dilation.  I would recommend you follow up with me after you meet with the speech pathologist to discuss whether the benefits of an EGD/dilation outweigh the risks.  We also need to obtain clearance from cardiology that you can undergo an elective procedure without any further evaluation, especially given your recent ER visit for syncope.   Maya,  Can you please place a speech pathology referral and follow up on the cardiology clearance request?

## 2023-05-09 ENCOUNTER — Other Ambulatory Visit: Payer: Self-pay | Admitting: Internal Medicine

## 2023-05-13 ENCOUNTER — Other Ambulatory Visit (HOSPITAL_COMMUNITY): Payer: Self-pay | Admitting: *Deleted

## 2023-05-13 DIAGNOSIS — R1310 Dysphagia, unspecified: Secondary | ICD-10-CM

## 2023-05-21 ENCOUNTER — Telehealth: Payer: Self-pay

## 2023-05-21 ENCOUNTER — Ambulatory Visit (HOSPITAL_COMMUNITY)
Admission: RE | Admit: 2023-05-21 | Discharge: 2023-05-21 | Disposition: A | Discharge status: Home / Self Care | Payer: Medicare Other | Source: Ambulatory Visit | Attending: Nurse Practitioner | Admitting: Nurse Practitioner

## 2023-05-21 DIAGNOSIS — R1310 Dysphagia, unspecified: Secondary | ICD-10-CM

## 2023-05-21 DIAGNOSIS — Z87891 Personal history of nicotine dependence: Secondary | ICD-10-CM | POA: Insufficient documentation

## 2023-05-21 DIAGNOSIS — K449 Diaphragmatic hernia without obstruction or gangrene: Secondary | ICD-10-CM | POA: Diagnosis not present

## 2023-05-21 DIAGNOSIS — K225 Diverticulum of esophagus, acquired: Secondary | ICD-10-CM | POA: Diagnosis not present

## 2023-05-21 DIAGNOSIS — K224 Dyskinesia of esophagus: Secondary | ICD-10-CM | POA: Diagnosis not present

## 2023-05-21 DIAGNOSIS — Z981 Arthrodesis status: Secondary | ICD-10-CM | POA: Diagnosis not present

## 2023-05-21 DIAGNOSIS — K222 Esophageal obstruction: Secondary | ICD-10-CM | POA: Diagnosis present

## 2023-05-21 DIAGNOSIS — R1313 Dysphagia, pharyngeal phase: Secondary | ICD-10-CM | POA: Diagnosis not present

## 2023-05-21 DIAGNOSIS — R09A2 Foreign body sensation, throat: Secondary | ICD-10-CM | POA: Diagnosis present

## 2023-05-21 NOTE — Progress Notes (Signed)
05/21/23 1300  SLP Visit Information  SLP Received On 05/21/23  General Information  HPI 89 yo male referred for OP MBS due to pt having recent esophagram showing silent aspiration toward end of study.  Pt also found to have moderate dysmotility, tiny sliding hiatal hernia, tiny diverticulum, prominent cricopharyngeus C5-C6.   PMH + for allergic rhinitis, spinal stenosis, pain syndrome, HCAP, opioid dependence, cervical spine deficits s/p posterior right cervical fusion, prior smoker 1950-1983, recent syncope with vomiting 04/20/2023.  CT chest showed improved lung volumes compared to prior - and mild ATX.   Pt reports sensation of difficulty with sensing "sticking" in his lower throat - requiring liquids to clear.  He denies weight loss nor requiring heimlich manuever.  Caregiver present No  Diet Prior to this Study Regular;Thin liquids (Level 0)  Temperature  Normal  Respiratory Status WFL  Supplemental O2 None (Room air)  History of Recent Intubation No  Behavior/Cognition Alert;Cooperative;Pleasant mood  Self-Feeding Abilities Able to self-feed  Baseline vocal quality/speech Normal  Volitional Cough Able to elicit  Volitional Cough Assessment Appears WFL  Volitional Swallow Able to elicit  Anatomy Prominent cricopharyngeus;Presence of cervical hardware  Orofacial Exam  Oral Cavity: Oral Hygiene WFL  Oral Cavity - Dentition Adequate natural dentition  Orofacial Anatomy WFL  Oral Motor/Sensory Function WFL  Boluses Administered  Boluses Administered Thin liquids (Level 0);Mildly thick liquids (Level 2, nectar thick);Moderately thick liquids (Level 3, honey thick);Puree;Solid  Oral Impairment Domain  Lip Closure No labial escape  Tongue control during bolus hold Cohesive bolus between tongue to palatal seal  Bolus preparation/mastication Timely and efficient chewing and mashing  Bolus transport/lingual motion Brisk tongue motion  Oral residue Trace residue lining oral structures   Location of oral residue  Tongue  Initiation of pharyngeal swallow  Pyriform sinuses  Pharyngeal Impairment Domain  Soft palate elevation No bolus between soft palate (SP)/pharyngeal wall (PW)  Laryngeal elevation Partial superior movement of thyroid cartilage/partial approximation of arytenoids to epiglottic petiole  Anterior hyoid excursion Partial anterior movement  Epiglottic movement Complete inversion  Laryngeal vestibule closure Incomplete, narrow column air/contrast in laryngeal vestibule  Pharyngeal stripping wave  Present - complete  Pharyngeal contraction (A/P view only) N/A (on esophagram pt was tested in A-P view)  Pharyngoesophageal segment opening Minimal distention/minimal duration, marked obstruction of flow (pt has known small Zenker's diverticulum)  Tongue base retraction No contrast between tongue base and posterior pharyngeal wall (PPW)  Pharyngeal residue Complete pharyngeal clearance  Esophageal Impairment Domain  Esophageal clearance upright position  (DNT, pt had recent esophagram)  Pill  Consistency administered  (DNt)  Penetration/Aspiration Scale Score  1.  Material does not enter airway Mildly thick liquids (Level 2, nectar thick);Moderately thick liquids (Level 3, honey thick);Puree;Solid  3.  Material enters airway, remains ABOVE vocal cords and not ejected out Thin liquids (Level 0)  8.  Material enters airway, passes BELOW cords without attempt by patient to eject out (silent aspiration)  Thin liquids (Level 0)  Compensatory Strategies  Compensatory strategies Yes  Chin tuck Effective  Effective Chin Tuck Thin liquid (Level 0)  Super supraglottic swallow Effective  Effective Super Supraglottic Swallow Thin liquid (Level 0)  Clinical Impression  Clinical Impression Patient presents with minimal pharyngeal dysphagia mostly c/b diminished laryngeal elevation and closure allowing laryngeal penetration and silent aspiration of thin liquids via cup.  Chin  tuck posture helpful to prevent aspiration and penetration and pt denies discomfort with posture. Also tested Super supraglottic swallow with thin  with adequate airway protection but this may be more taxing for this pt.  Pharyngeal clearance is complete fortunately. Did not test tablet nor test pt  in A-P view as Dr Eppie Gibson had so kindly conducted this on recent esophagram.   Recommend continue diet using chin tuck posture with thin liquids *especially if pt is acutely ill. Pt with known Zenker's diverticulum that did not appear to significantly impair swallow. Barium appeared present in the diverticulum and did not clear but did not worsen over study.  Of note, he did cough with thin water consumption prior to barium administration - likely audible aspiration at that time.   His symptoms of having difficulty "getting things down" and needing liquids to clear - were not replicated during this eval. Thanks so much for this referral.  SLP Visit Diagnosis Dysphagia, pharyngeal phase (R13.13)  Exam Limitations No limitations  Swallowing Evaluation Recommendations  Recommendations PO diet  PO Diet Recommendation Regular;Thin liquids (Level 0)  Liquid Administration via Cup;Straw  Medication Administration Whole meds with puree (or try with nectar liquids)  Supervision Patient able to self-feed  Swallowing strategies   Slow rate;Small bites/sips;Chin tuck  Postural changes Position pt fully upright for meals;Stay upright 30-60 min after meals  Oral care recommendations Oral care BID (2x/day)  Treatment Plan  Treatment recommendations No treatment recommended at this time  Functional status assessment Patient has had a recent decline in their functional status and/or demonstrates limited ability to make significant improvements in function in a reasonable and predictable amount of time.  SLP Time Calculation  SLP Start Time (ACUTE ONLY) 1310  SLP Stop Time (ACUTE ONLY) 1345  SLP Time Calculation (min)  (ACUTE ONLY) 35 min  SLP Evaluations  $ SLP Speech Visit 1 Visit  SLP Evaluations  $Outpatient MBS Swallow 1 Procedure   Rolena Infante, MS [facility] SLP Acute Rehab Services Office [phone]

## 2023-05-21 NOTE — Telephone Encounter (Signed)
Transition Care Management Unsuccessful Follow-up Telephone Call  Date of discharge and from where:  [name] 9/9  Attempts:  2nd Attempt  Reason for unsuccessful TCM follow-up call:  No answer/busy   [name]  [facility], [facility] Guide, Phone: [phone] Website: [email or web address]

## 2023-05-21 NOTE — Telephone Encounter (Signed)
Transition Care Management Unsuccessful Follow-up Telephone Call  Date of discharge and from where:  [facility]  Attempts:  1st Attempt  Reason for unsuccessful TCM follow-up call:  No answer/busy   Lenard Forth [place]  [facility], [facility] Guide, Phone: [phone] Website: [email or web address]

## 2023-05-27 ENCOUNTER — Ambulatory Visit: Payer: Medicare Other | Admitting: Surgery

## 2023-06-03 ENCOUNTER — Encounter: Payer: Self-pay | Admitting: Surgery

## 2023-06-03 ENCOUNTER — Ambulatory Visit (INDEPENDENT_AMBULATORY_CARE_PROVIDER_SITE_OTHER): Payer: Medicare Other | Admitting: Surgery

## 2023-06-03 VITALS — BP 94/55 | HR 73 | Temp 98.3°F | Ht 72.0 in | Wt 174.6 lb

## 2023-06-03 DIAGNOSIS — I83811 Varicose veins of right lower extremities with pain: Secondary | ICD-10-CM | POA: Diagnosis not present

## 2023-06-03 NOTE — Progress Notes (Signed)
Vascular and Vein Specialist of   Patient name: [name] MRN: [id number] DOB: 1932/06/10 Sex: male   REASON FOR VISIT:    Follow-up varicose veins  HISOTRY OF PRESENT ILLNESS:    [name] is a 89 y.o. male who was seen in the [facility] clinic in July 2020 for for right leg ulcerations and edema.  He does have a history of DVT in the right leg.  He has a history of venous ulceration of the left leg which has healed.  He does wear compression on the left side.  He tries to keep his legs elevated as much as possible.  He does take care of his bed ridden wife.  He avoids prolonged sitting and standing.   PAST MEDICAL HISTORY:   Past Medical History:  Diagnosis Date   ACTINIC KERATOSIS, HEAD 03/23/2008   ALLERGIC RHINITIS 03/27/2007   Arthritis    BENIGN PROSTATIC HYPERTROPHY, WITH URINARY OBSTRUCTION 10/16/2007   CAD 08/18/2007   "patient denies any issues with heart, does not see cardiologist"   Carpal tunnel syndrome, bilateral 09/04/2016   CARPAL TUNNEL SYNDROME, LEFT 04/13/2008   CHF (congestive heart failure) (HCC)    Chronic back pain    Chronic neck pain    Complication of anesthesia    left lower jaw teeth have been injured in the past and he has lost those, now has 2 loose front teeth due to a mouth injury on 01/02/20   DVT (deep venous thrombosis) (HCC)    ERECTILE DYSFUNCTION, ORGANIC 02/20/2010   Gout, unspecified 02/20/2010   Headache(784.0)    migraines hx of- started at age 46- none since 14   History of melanoma    HYPERLIPIDEMIA, WITH HIGH HDL 10/18/2008   HYPERTENSION 05/09/2007   sees Dr. 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   Patellar tendinitis 10/10/2009   Sciatic nerve pain    on Oxycodone as needed   SKIN CANCER, HX OF 03/27/2007   UNS ADVRS EFF UNS RX MEDICINAL&BIOLOGICAL  SBSTNC 08/18/2007   Urinary urgency      FAMILY HISTORY:   Family History  Problem Relation Age of Onset   Dementia Mother    Anemia Father    Heart disease Father    Stroke Sister        ICH   Lung disease Sister     SOCIAL HISTORY:   Social History   Tobacco Use   Smoking status: Former    Current packs/day: 0.00    Average packs/day: 1 pack/day for 33.0 years (33.0 ttl pk-yrs)    Types: Cigarettes    Start date: 09/29/1948    Quit date: 09/29/1981    Years since quitting: 41.7   Smokeless tobacco: Never  Substance Use Topics   Alcohol use: Yes    Alcohol/week: 21.0 standard drinks of alcohol    Types: 21 Shots of liquor per week    Comment: finishes 1/2L per week dark rum.- 3 shots of liquor a day     ALLERGIES:   Allergies  Allergen Reactions   Amlodipine Swelling and Other (See Comments)    Ankle swelling   Lyrica [Pregabalin] Swelling and Other (See Comments)    Hands and feet swell   Gabapentin Other (See Comments)    Lower extremity edema- knees and feet     CURRENT  MEDICATIONS:   Current Outpatient Medications  Medication Sig Dispense Refill   amLODipine (NORVASC) 5 MG tablet TAKE 1 TABLET (5 MG TOTAL) BY MOUTH DAILY. 90 tablet 1   aspirin EC 81 MG tablet Take 1 tablet (81 mg total) by mouth daily. Swallow whole. 30 tablet 0   clotrimazole (LOTRIMIN) 1 % cream APPLY TO AFFECTED AREA TWICE A DAY (Patient taking differently: Apply 1 Application topically 2 (two) times daily as needed (for dryness).) 30 g 0   ELIQUIS 5 MG TABS tablet TAKE 1 TABLET BY MOUTH TWICE A DAY 60 tablet 5   fluticasone (FLONASE) 50 MCG/ACT nasal spray Place 1 spray into both nostrils daily as needed for allergies.     furosemide (LASIX) 20 MG tablet Take 1 tablet (20 mg total) by mouth daily. 90 tablet 3   isosorbide mononitrate (IMDUR) 30 MG 24 hr tablet TAKE 1 TABLET BY MOUTH EVERY DAY 90 tablet 1   lidocaine 4 % Place 1 patch onto the skin daily. 10 patch 0   losartan  (COZAAR) 50 MG tablet TAKE 1 TABLET BY MOUTH EVERY DAY 90 tablet 0   metoprolol succinate (TOPROL-XL) 50 MG 24 hr tablet TAKE 1 TABLET BY MOUTH EVERY DAY 90 tablet 3   Oxycodone HCl 10 MG TABS Take 10-20 mg by mouth See admin instructions. Take 20 mg by mouth in the morning and evening. May take an additional 10 mg once a day around noontime as needed for unresolved pain.     potassium chloride SA (KLOR-CON M) 20 MEQ tablet Take 1 tablet (20 mEq total) by mouth 2 (two) times daily for 2 days. 4 tablet 0   SYMPROIC 0.2 MG TABS Take 0.2 mg by mouth in the morning.     tamsulosin (FLOMAX) 0.4 MG CAPS capsule TAKE 1 CAPSULE BY MOUTH EVERY DAY 90 capsule 1   triamcinolone cream (KENALOG) 0.1 % Apply 1 Application topically 2 (two) times daily as needed (rough/irritated skin).     No current facility-administered medications for this visit.    REVIEW OF SYSTEMS:   [X]  denotes positive finding, [ ]  denotes negative finding Cardiac  Comments:  Chest pain or chest pressure:    Shortness of breath upon exertion:    Short of breath when lying flat:    Irregular heart rhythm:        Vascular    Pain in calf, thigh, or hip brought on by ambulation:    Pain in feet at night that wakes you up from your sleep:     Blood clot in your veins:    Leg swelling:  x       Pulmonary    Oxygen at home:    Productive cough:     Wheezing:         Neurologic    Sudden weakness in arms or legs:     Sudden numbness in arms or legs:     Sudden onset of difficulty speaking or slurred speech:    Temporary loss of vision in one eye:     Problems with dizziness:         Gastrointestinal    Blood in stool:     Vomited blood:         Genitourinary    Burning when urinating:     Blood in urine:        Psychiatric    Major depression:         Hematologic    Bleeding problems:  Problems with blood clotting too easily:        Skin    Rashes or ulcers:        Constitutional    Fever or chills:       PHYSICAL EXAM:   There were no vitals filed for this visit.  GENERAL: The patient is a well-nourished male, in no acute distress. The vital signs are documented above. CARDIAC: There is a regular rate and rhythm.  VASCULAR: SonoSite was used to evaluate the saphenous vein from the groin to the knee it was markedly dilated and straight in caliber.  There were multiple varicosities throughout the medial calf PULMONARY: Non-labored respirations MUSCULOSKELETAL: There are no major deformities or cyanosis. NEUROLOGIC: No focal weakness or paresthesias are detected. SKIN: Hyperpigmentation of both lower extremities. PSYCHIATRIC: The patient has a normal affect.  STUDIES:   I have reviewed the following: Venous Reflux Times  +--------------+---------+------+-----------+------------+--------+  RIGHT        Reflux NoRefluxReflux TimeDiameter cmsComments                          Yes                                   +--------------+---------+------+-----------+------------+--------+  CFV                    yes   >1 second                       +--------------+---------+------+-----------+------------+--------+  FV mid        no                                              +--------------+---------+------+-----------+------------+--------+  Popliteal    no                                              +--------------+---------+------+-----------+------------+--------+  GSV at SFJ              yes    >500 ms      0.86              +--------------+---------+------+-----------+------------+--------+  GSV prox thigh          yes    >500 ms      0.89              +--------------+---------+------+-----------+------------+--------+  GSV mid thigh           yes    >500 ms      0.66              +--------------+---------+------+-----------+------------+--------+  GSV dist thigh          yes    >500 ms      0.57               +--------------+---------+------+-----------+------------+--------+  GSV at knee             yes    >500 ms      0.58              +--------------+---------+------+-----------+------------+--------+  GSV prox calf  yes    >500 ms      0.63              +--------------+---------+------+-----------+------------+--------+  GSV mid calf  no                            0.64              +--------------+---------+------+-----------+------------+--------+  SSV Pop Fossa no                            0.39              +--------------+---------+------+-----------+------------+--------+  SSV prox calf           yes    >500 ms      0.41              +--------------+---------+------+-----------+------------+--------+  SSV mid calf            yes    >500 ms      0.29              +--------------+---------+------+-----------+------------+--------+  AASV O                  yes    >500 ms      0.42              +--------------+---------+------+-----------+------------+--------+  AASV P        no                                    NV        +--------------+---------+------+-----------+------------+--------+     +--------------+---------+------+-----------+------------+--------+  LEFT         Reflux NoRefluxReflux TimeDiameter cmsComments                          Yes                                   +--------------+---------+------+-----------+------------+--------+  CFV          no                                              +--------------+---------+------+-----------+------------+--------+  FV mid        no                                              +--------------+---------+------+-----------+------------+--------+  Popliteal              yes   >1 second                       +--------------+---------+------+-----------+------------+--------+  GSV at [facility]    no                            0.31               +--------------+---------+------+-----------+------------+--------+  GSV prox thighno  0.77              +--------------+---------+------+-----------+------------+--------+  GSV mid thigh no                            0.50              +--------------+---------+------+-----------+------------+--------+  GSV dist thighno                            0.53              +--------------+---------+------+-----------+------------+--------+  GSV at knee             yes    >500 ms      0.49              +--------------+---------+------+-----------+------------+--------+  GSV prox calf no                            0.46              +--------------+---------+------+-----------+------------+--------+  GSV mid calf  no                            0.36              +--------------+---------+------+-----------+------------+--------+  SSV Pop Fossa no                            0.46              +--------------+---------+------+-----------+------------+--------+  SSV prox calf                               0.40              +--------------+---------+------+-----------+------------+--------+  SSV mid calf  no                            0.41              +--------------+---------+------+-----------+------------+--------+  AASV O                                              NV        +--------------+---------+------+-----------+------------+--------+    MEDICAL ISSUES:   CEAP class 5, right leg: The patient's wounds have just recently healed.  He still complains of significant swelling in his right leg.  He tries to keep it elevated when possible however he is on his feet a lot because he was the sole caregiver of his bedridden wife.  He strongly desires to get his swelling taken care of because he was miserable while he had a wound.  He has significant difficulty wearing 20-30 compression stockings  which she has tried but has trouble getting them on and has no resources available to help him.  I discussed that he would be a candidate for laser ablation of the right saphenous vein with 10-20 stabs to treat his varicosities and significant axial reflux which caused him to have a stasis ulcer.  He continues to have hyperpigmentation in the right leg.  I did  discuss the fact that he is 89 years old, however he states that the swelling and discomfort are such a huge issue for him regarding quality of life that he wants to get this taken care of.  I am going to need to find him somebody to help take care of his wife while he is convalescing.  We will submit him for insurance approval and try to get home assistance for his wife.    Charlena Cross, MD, FACS V[facility] and Vein Specialists of [facility] [phone] Pager [phone]

## 2023-06-07 ENCOUNTER — Telehealth (HOSPITAL_COMMUNITY): Payer: Self-pay | Admitting: Licensed Clinical Social Worker

## 2023-06-07 NOTE — Telephone Encounter (Addendum)
CSW contacted patient to offer some resources to care for his wife as he is sole caregiver for bedridden wife. Patient needs a laser ablation and would need some time at home to recover from procedure and unable to care for his wife. CSW discussed some options although unlikely anything would be covered under insurance. CSW explained most options from private home care to respite care in a facility would require private pay. Patient explained that his son handles all the finances at this point and he is "unsure how much is left". Patient shared they have considered selling some of their property but have not done so yet. He states that his son is unavailable at this time as he is at the hospital with his own son who attempted suicide and the situation appears to be critical. Patient states he is unable to proceed at this time as he cannot involve his son at this time. Patient was very appreciative of the call and has CSW contact information if needed. Lasandra Beech, LCSW, CCSW-MCS [phone]

## 2023-06-29 ENCOUNTER — Other Ambulatory Visit: Payer: Self-pay | Admitting: Internal Medicine

## 2023-07-16 ENCOUNTER — Other Ambulatory Visit: Payer: Self-pay | Admitting: Internal Medicine

## 2023-07-24 ENCOUNTER — Encounter (HOSPITAL_BASED_OUTPATIENT_CLINIC_OR_DEPARTMENT_OTHER): Payer: Medicare Other | Attending: Internal Medicine | Admitting: Internal Medicine

## 2023-07-24 DIAGNOSIS — L988 Other specified disorders of the skin and subcutaneous tissue: Secondary | ICD-10-CM | POA: Diagnosis not present

## 2023-07-24 DIAGNOSIS — I87333 Chronic venous hypertension (idiopathic) with ulcer and inflammation of bilateral lower extremity: Secondary | ICD-10-CM | POA: Diagnosis not present

## 2023-07-24 DIAGNOSIS — I890 Lymphedema, not elsewhere classified: Secondary | ICD-10-CM | POA: Diagnosis present

## 2023-07-24 DIAGNOSIS — L97818 Non-pressure chronic ulcer of other part of right lower leg with other specified severity: Secondary | ICD-10-CM | POA: Insufficient documentation

## 2023-07-24 DIAGNOSIS — I87331 Chronic venous hypertension (idiopathic) with ulcer and inflammation of right lower extremity: Secondary | ICD-10-CM | POA: Diagnosis not present

## 2023-07-24 DIAGNOSIS — Z87891 Personal history of nicotine dependence: Secondary | ICD-10-CM | POA: Diagnosis not present

## 2023-07-24 DIAGNOSIS — I872 Venous insufficiency (chronic) (peripheral): Secondary | ICD-10-CM | POA: Insufficient documentation

## 2023-07-24 DIAGNOSIS — L89313 Pressure ulcer of right buttock, stage 3: Secondary | ICD-10-CM | POA: Diagnosis not present

## 2023-07-24 DIAGNOSIS — Z86711 Personal history of pulmonary embolism: Secondary | ICD-10-CM | POA: Insufficient documentation

## 2023-07-25 NOTE — Progress Notes (Signed)
ADITHYA, ALMAN ([id number]) [phone] [id number] Page 1 of 4 Visit Report for 07/24/2023 Abuse Risk Screen Details Patient Name: Date of Service: [place] 07/24/2023 12:30 PM Medical Record Number: [id number] Patient Account Number: [id number] Date of Birth/Sex: Treating RN: [birth year] (89 y.o. [name], [name] Primary Care Ravin Denardo: Alcide Clever Other Clinician: Referring Jamahl Lemmons: Treating Siddhartha Hoback/Extender: Baltazar Najjar [facility], MICHELLE Weeks in Treatment: 0 Abuse Risk Screen Items Answer ABUSE RISK SCREEN: Has anyone close to you tried to hurt or harm you recentlyo No Do you feel uncomfortable with anyone in your familyo No Has anyone forced you do things that you didnt want to doo No Electronic Signature(s) Signed: 07/24/2023 4:44:05 PM By: Fonnie Mu RN Entered By: Fonnie Mu on 07/24/2023 09:45:57 -------------------------------------------------------------------------------- Activities of Daily Living Details Patient Name: Date of Service: [place] 07/24/2023 12:30 PM Medical Record Number: [id number] Patient Account Number: [id number] Date of Birth/Sex: Treating RN: [birth year] (89 y.o. [name], [name] Primary Care Carolann Brazell: Alcide Clever Other Clinician: Referring Lauranne Beyersdorf: Treating Arienne Gartin/Extender: Baltazar Najjar [facility], MICHELLE Weeks in Treatment: 0 Activities of Daily Living Items Answer Activities of Daily Living (Please select one for each item) Drive Automobile Completely Able T Medications ake Completely Able Use T elephone Completely Able Care for Appearance Completely Able Use T oilet Completely Able Bath / Shower Completely Able Dress Self Completely Able Feed Self Completely Able Walk Completely Able Get In / Out Bed Completely Able Housework Completely Able Prepare Meals Completely Able Handle Money Completely Able Shop for Self Completely Able Electronic  Signature(s) Signed: 07/24/2023 4:44:05 PM By: Fonnie Mu RN Entered By: Fonnie Mu on 07/24/2023 09:46:18 [name] ([id number]) [id number] Page 2 of 4 -------------------------------------------------------------------------------- Education Screening Details Patient Name: Date of Service: [place] 07/24/2023 12:30 PM Medical Record Number: [id number] Patient Account Number: [id number] Date of Birth/Sex: Treating RN: 06/15/1932 (89 y.o. [name], [name] Primary Care Aiman Noe: Alcide Clever Other Clinician: Referring Joey Lierman: Treating Nuala Chiles/Extender: Baltazar Najjar [facility], MICHELLE Weeks in Treatment: 0 Primary Learner Assessed: Patient Learning Preferences/Education Level/Primary Language Learning Preference: Explanation, Demonstration, Communication Board, Printed Material Highest Education Level: High School Preferred Language: English Cognitive Barrier Language Barrier: No Translator Needed: No Memory Deficit: No Emotional Barrier: No Cultural/Religious Beliefs Affecting Medical Care: No Physical Barrier Impaired Vision: No Impaired Hearing: No Decreased Hand dexterity: No Knowledge/Comprehension Knowledge Level: High Comprehension Level: High Ability to understand written instructions: High Ability to understand verbal instructions: High Motivation Anxiety Level: Calm Cooperation: Cooperative Education Importance: Denies Need Interest in Health Problems: Asks Questions Perception: Coherent Willingness to Engage in Self-Management High Activities: Readiness to Engage in Self-Management High Activities: Electronic Signature(s) Signed: 07/24/2023 4:44:05 PM By: Fonnie Mu RN Entered By: Fonnie Mu on 07/24/2023 09:47:01 -------------------------------------------------------------------------------- Fall Risk Assessment Details Patient Name: Date of Service: [name],  Katlin E. 07/24/2023 12:30 PM Medical Record Number: [id number] Patient Account Number: [id number] Date of Birth/Sex: Treating RN: Jul 26, 1932 (89 y.o. Lucious Groves Primary Care Idalys Konecny: Alcide Clever Other Clinician: Referring Makai Dumond: Treating Analuisa Tudor/Extender: Baltazar Najjar [facility], MICHELLE Weeks in Treatment: 0 Fall Risk Assessment Items Have you had 2 or more falls in the last 12 monthso 0 No BREYEN, DEVITT E ([id number]) [id number] Page 3 of 4 Have you had any fall that resulted in injury in the last 12 monthso 0 Yes FALLS RISK SCREEN History of falling - immediate or within 3 months 0 No Secondary diagnosis (Do you have 2 or more medical diagnoseso) 0 No  Ambulatory aid None/bed rest/wheelchair/nurse 0 No Crutches/cane/walker 0 No Furniture 0 No Intravenous therapy Access/Saline/Heparin Lock 0 No Gait/Transferring Normal/ bed rest/ wheelchair 0 No Weak (short steps with or without shuffle, stooped but able to lift head while walking, may seek 0 No support from furniture) Impaired (short steps with shuffle, may have difficulty arising from chair, head down, impaired 0 No balance) Mental Status Oriented to own ability 0 No Electronic Signature(s) Signed: 07/24/2023 4:44:05 PM By: Fonnie Mu RN Entered By: Fonnie Mu on 07/24/2023 09:47:31 -------------------------------------------------------------------------------- Foot Assessment Details Patient Name: Date of Service: [name], Marlyn E. 07/24/2023 12:30 PM Medical Record Number: [id number] Patient Account Number: [id number] Date of Birth/Sex: Treating RN: 06-02-1932 (89 y.o. [name], [name] Primary Care Clarisa Danser: Alcide Clever Other Clinician: Referring Zeferino Mounts: Treating Kaleea Penner/Extender: Baltazar Najjar [facility], MICHELLE Weeks in Treatment: 0 Foot Assessment Items Site Locations + = Sensation present, - = Sensation absent, C = Callus, U =  Ulcer R = Redness, W = Warmth, M = Maceration, PU = Pre-ulcerative lesion F = Fissure, S = Swelling, D = Dryness Assessment Right: Left: Other Deformity: No No Prior Foot Ulcer: No No Prior Amputation: No No Charcot Joint: No No Ambulatory Status: GaitCORYE, JEANNOT ([id number]) [id number] Page 4 of 4 Electronic Signature(s) Signed: 07/24/2023 4:44:05 PM By: Fonnie Mu RN Entered By: Fonnie Mu on 07/24/2023 09:48:01 -------------------------------------------------------------------------------- Nutrition Risk Screening Details Patient Name: Date of Service: [name], Nekoda E. 07/24/2023 12:30 PM Medical Record Number: [id number] Patient Account Number: [id number] Date of Birth/Sex: Treating RN: 12-08-1931 (89 y.o. [name], [name] Primary Care Cadence Minton: Alcide Clever Other Clinician: Referring Amazin Pincock: Treating Locklyn Henriquez/Extender: Baltazar Najjar [facility], MICHELLE Weeks in Treatment: 0 Height (in): 72 Weight (lbs): 198 Body Mass Index (BMI): 26.9 Nutrition Risk Screening Items Score Screening NUTRITION RISK SCREEN: I have an illness or condition that made me change the kind and/or amount of food I eat 0 No I eat fewer than two meals per day 0 No I eat few fruits and vegetables, or milk products 0 No I have three or more drinks of beer, liquor or wine almost every day 0 No I have tooth or mouth problems that make it hard for me to eat 0 No I don't always have enough money to buy the food I need 0 No I eat alone most of the time 0 No I take three or more different prescribed or over-the-counter drugs a day 0 No Without wanting to, I have lost or gained 10 pounds in the last six months 0 No I am not always physically able to shop, cook and/or feed myself 0 No Nutrition Protocols Good Risk Protocol 0 No interventions needed Moderate Risk Protocol High Risk Proctocol Risk Level: Good Risk Score: 0 Electronic  Signature(s) Signed: 07/24/2023 4:44:05 PM By: Fonnie Mu RN Entered By: Fonnie Mu on 07/24/2023 09:47:37

## 2023-07-25 NOTE — Progress Notes (Addendum)
[name], [name] ([id number]) [phone].pdf Page 1 of 9 Visit Report for 07/24/2023 Chief Complaint Document Details Patient Name: Date of Service: [place] 07/24/2023 12:30 PM Medical Record Number: [id number] Patient Account Number: [id number] Date of Birth/Sex: Treating RN: 22-Jul-1932 (89 y.o. M) Primary Care Provider: Alcide Clever Other Clinician: Referring Provider: Treating Provider/Extender: Baltazar Najjar [facility], MICHELLE Weeks in Treatment: 0 Information Obtained from: Patient Chief Complaint Patient presents for treatment of an open ulcer due to venous insufficiency 07/24/2023; patient arrives in clinic with circumferential blistering and skin breakdown in the right lower leg Electronic Signature(s) Signed: 07/24/2023 5:18:28 PM By: Baltazar Najjar MD Entered By: Baltazar Najjar on 07/24/2023 13:23:27 -------------------------------------------------------------------------------- HPI Details Patient Name: Date of Service: [name], Roney E. 07/24/2023 12:30 PM Medical Record Number: [id number] Patient Account Number: [id number] Date of Birth/Sex: Treating RN: May 19, 1932 (89 y.o. M) Primary Care Provider: Alcide Clever Other Clinician: Referring Provider: Treating Provider/Extender: Baltazar Najjar [facility], MICHELLE Weeks in Treatment: 0 History of Present Illness HPI Description: ADMISSION 01/11/2023 This is an 89 year old nondiabetic with a history of chronic venous insufficiency, coronary artery disease, congestive heart failure, and lower extremity edema. He has historically been followed by podiatry for various ulcers and calluses on his feet. When he was seen in the middle of April, he was noted to have an ulcer starting on his left ankle. He was prescribed a course of cephalexin and advised to apply Betadine to the wound with an Ace wrap. He was referred to the wound care center for further evaluation and management.  Apparently the wound closed of its own accord prior to his appointment with [place], but subsequently reopened. The notes from podiatry do indicate that compression and elevation were discussed as means to help heal ulceration and avoid future episodes. After the wound reopened, he was seen again and prescribed a course of Augmentin. He is now here for his wound care appointment. 01/21/2023: The wound is smaller and more superficial. It is fairly clean with just a little bit of light slough on the surface. His venous reflux studies are scheduled for this coming Thursday. 01/29/2023: The wound is smaller again today and very superficial. There is minimal slough present with a little eschar around the edges. His venous reflux studies were completed, and as expected, he has severe venous reflux. 02/07/2023: His wound is very nearly closed. He has an appointment coming up with vascular surgery on July 8. He indicated a callus on the bottom of his left foot that is bothering him today. 02/15/2023: There is just a pinhole opening remaining on the left, but unfortunately, he has had a new ulceration occur on the medial aspect of his right lower leg. The fat layer is exposed. He does have 2+ pitting edema in this leg. He says he noted fluid coming from the site a couple of days ago. He does not wear compression garments due to discomfort with stockings, but he has never received juxta lite or Farrow wrap garments. 02/22/2023: His left leg has healed. On the right, the open area has contracted considerably. There is some slough and eschar present. He did meet with vascular surgery for his initial consultation and will return in 3 months to discuss saphenous vein ablation. 03/04/2023: His wraps slid down bilaterally and he is leaking clear fluid from the left leg. There is slough build up on the right lower leg wound, but the overall size is smaller. 03/11/2023: The area on his left leg where he was leaking  fluid has  closed. The right lower leg wound is smaller and clean. He does have his Farrow wraps with him today. [name], [name] ([id number]) [phone].pdf Page 2 of 9 03/27/2023: At his nurse visit last week, his left medial ankle was found to be open. He has another new wound today on the left anterior tibial surface. The right ankle ulcer is smaller today. Everything is very superficial and clean. 04/04/2023: Despite being in bilateral 3 layer compression equivalent, he has developed new wounds. All of these seem to be secondary to blistering. He has a new wound on his right lateral lower leg just above the ankle and 2 new sites on his left anterolateral lower leg. These are fairly tender and a the skin is little bit erythematous around them. 04/18/2023: His wounds are healed. READMISSION 07/24/2023 This is an 89 year old man we had in clinic from 01/11/2023 through 04/18/2023 with bilateral lower leg wounds felt to be secondary to bilateral chronic venous insufficiency. Eventually he healed up. He was discharged with Fabian November wraps and he had a vein and vascular follow-up. Indeed he did see Dr. Myra Gianotti on 06/03/2023 and it was felt that he would be a candidate for right saphenous vein ablations. Shortly thereafter the patient reports that he developed rapid increase in edema in his legs right greater than left. More recently there was blistering and open wounds in the right posterior and anterior ankle area. He now has open wounds in this area. In discussion with him it is quite clear he was not wearing the Farrow wraps. I am not certain if it was an issue with him not being able to get them on himself or simply disinterested. He says at 1 point my legs were not swollen so I did not put them on. 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 The patient has a history of pulmonary embolism, spinal stenosis and chronic venous insufficiency. I do not think he is  felt to have a significant arterial issue. Electronic Signature(s) Signed: 07/24/2023 5:18:28 PM By: Baltazar Najjar MD Entered By: Baltazar Najjar on 07/24/2023 13:26:09 -------------------------------------------------------------------------------- Physical Exam Details Patient Name: Date of Service: [name], Haik E. 07/24/2023 12:30 PM Medical Record Number: [id number] Patient Account Number: [id number] Date of Birth/Sex: Treating RN: 02/14/1932 (89 y.o. M) Primary Care Provider: Alcide Clever Other Clinician: Referring Provider: Treating Provider/Extender: Baltazar Najjar [facility], MICHELLE Weeks in Treatment: 0 Constitutional Sitting or standing Blood Pressure is within target range for patient.. Pulse regular and within target range for patient.Marland Kitchen Respirations regular, non-labored and within target range.. Temperature is normal and within the target range for the patient.Marland Kitchen Appears in no distress. Respiratory work of breathing is normal. Bilateral breath sounds are clear and equal in all lobes with no wheezes, rales or rhonchi.. Cardiovascular Heart rhythm and rate regular, without murmur or gallop.JVP was not elevated there was no sacral edema. Severe bilateral right greater than left pitting edema.. Notes Wound exam; he has circumferential blistering and skin breakdown on the distal lower leg and the ankle area. Skin is simply peeling off in this area. No clear evidence of infection Electronic Signature(s) Signed: 07/24/2023 5:18:28 PM By: Baltazar Najjar MD Entered By: Baltazar Najjar on 07/24/2023 13:28:14 -------------------------------------------------------------------------------- Physician Orders Details Patient Name: Date of Service: [name], Levell E. 07/24/2023 12:30 PM Medical Record Number: [id number] Patient Account Number: [id number] Date of Birth/Sex: Treating RN: Oct 22, 1931 (89 y.o. Aj, H[place], Celester Morgan [place] ([id number])  [id number] Page  3 of 9 Primary Care Provider: Alcide Clever Other Clinician: Referring Provider: Treating Provider/Extender: Baltazar Najjar [facility], MICHELLE Weeks in Treatment: 0 Verbal / Phone Orders: No Diagnosis Coding Follow-up Appointments Return appointment in 3 weeks. - w/ Dr. Lady Gary Nurse Visit: - for next 2 weeks Bathing/ Shower/ Hygiene May shower with protection but do not get wound dressing(s) wet. Protect dressing(s) with water repellant cover (for example, large plastic bag) or a cast cover and may then take shower. Edema Control - Orders / Instructions Elevate legs to the level of the heart or above for 30 minutes daily and/or when sitting for 3-4 times a day throughout the day. Avoid standing for long periods of time. Lymphedema Treatment Plan - Exercise, Compression and Elevation Compression Wraps as ordered - urgo 2 lite bilateral leg wraps If legs are healed, may change to nurse visit. If one leg healed, pt. to bring farrow wrap to put on leg instead of wrap. Elevate legs 30 - 60 minutes at or above heart level at least 3 - 4 times daily as able/tolerated Avoid standing for long periods and elevate leg(s) parallel to the floor when sitting Wound Treatment Wound #8 - Lower Leg Wound Laterality: Right, Circumferential Cleanser: Soap and Water 1 x Per Week/30 Days Discharge Instructions: May shower and wash wound with dial antibacterial soap and water prior to dressing change. Peri-Wound Care: Zinc Oxide Ointment 30g tube 1 x Per Week/30 Days Discharge Instructions: Apply Zinc Oxide to periwound with each dressing change Prim Dressing: Maxorb Extra Ag+ Alginate Dressing, 4x4.75 (in/in) 1 x Per Week/30 Days ary Discharge Instructions: Apply to wound bed as instructed Secondary Dressing: ABD Pad, 5x9 1 x Per Week/30 Days Discharge Instructions: Apply over primary dressing as directed. Compression Wrap: Urgo K2 Lite, (equivalent to a  3 layer) two layer compression system, regular 1 x Per Week/30 Days Discharge Instructions: Apply Urgo K2 Lite as directed (alternative to 3 layer compression). Electronic Signature(s) Signed: 07/24/2023 4:44:05 PM By: Fonnie Mu RN Signed: 07/24/2023 5:18:28 PM By: Baltazar Najjar MD Entered By: Fonnie Mu on 07/24/2023 13:33:08 -------------------------------------------------------------------------------- Problem List Details Patient Name: Date of Service: [name], Jd E. 07/24/2023 12:30 PM Medical Record Number: [id number] Patient Account Number: [id number] Date of Birth/Sex: Treating RN: Jul 16, 1932 (89 y.o. M) Primary Care Provider: Alcide Clever Other Clinician: Referring Provider: Treating Provider/Extender: Baltazar Najjar [facility], MICHELLE Weeks in Treatment: 0 Active Problems ICD-10 Encounter Code Description Active Date MDM Diagnosis I87.333 Chronic venous hypertension (idiopathic) with ulcer and inflammation of 07/24/2023 No Yes bilateral lower extremity [name], [name] E ([id number]) [id number] Page 4 of 9 I89.0 Lymphedema, not elsewhere classified 07/24/2023 No Yes L97.818 Non-pressure chronic ulcer of other part of right lower leg with other specified 07/24/2023 No Yes severity Inactive Problems Resolved Problems Electronic Signature(s) Signed: 07/24/2023 5:18:28 PM By: Baltazar Najjar MD Entered By: Baltazar Najjar on 07/24/2023 13:22:07 -------------------------------------------------------------------------------- Progress Note Details Patient Name: Date of Service: [name], Lewis E. 07/24/2023 12:30 PM Medical Record Number: [id number] Patient Account Number: [id number] Date of Birth/Sex: Treating RN: [birth year] (89 y.o. M) Primary Care Provider: Alcide Clever Other Clinician: Referring Provider: Treating Provider/Extender: Hollie Salk, MICHELLE Weeks in Treatment: 0 Subjective Chief  Complaint Information obtained from Patient Patient presents for treatment of an open ulcer due to venous insufficiency 07/24/2023; patient arrives in clinic with circumferential blistering and skin breakdown in the right lower leg History of Present Illness (HPI) ADMISSION 01/11/2023 This is an 89 year old nondiabetic with a history of chronic venous insufficiency,  coronary artery disease, congestive heart failure, and lower extremity edema. He has historically been followed by podiatry for various ulcers and calluses on his feet. When he was seen in the middle of April, he was noted to have an ulcer starting on his left ankle. He was prescribed a course of cephalexin and advised to apply Betadine to the wound with an Ace wrap. He was referred to the wound care center for further evaluation and management. Apparently the wound closed of its own accord prior to his appointment with [place], but subsequently reopened. The notes from podiatry do indicate that compression and elevation were discussed as means to help heal ulceration and avoid future episodes. After the wound reopened, he was seen again and prescribed a course of Augmentin. He is now here for his wound care appointment. 01/21/2023: The wound is smaller and more superficial. It is fairly clean with just a little bit of light slough on the surface. His venous reflux studies are scheduled for this coming Thursday. 01/29/2023: The wound is smaller again today and very superficial. There is minimal slough present with a little eschar around the edges. His venous reflux studies were completed, and as expected, he has severe venous reflux. 02/07/2023: His wound is very nearly closed. He has an appointment coming up with vascular surgery on July 8. He indicated a callus on the bottom of his left foot that is bothering him today. 02/15/2023: There is just a pinhole opening remaining on the left, but unfortunately, he has had a new ulceration occur on the  medial aspect of his right lower leg. The fat layer is exposed. He does have 2+ pitting edema in this leg. He says he noted fluid coming from the site a couple of days ago. He does not wear compression garments due to discomfort with stockings, but he has never received juxta lite or Farrow wrap garments. 02/22/2023: His left leg has healed. On the right, the open area has contracted considerably. There is some slough and eschar present. He did meet with vascular surgery for his initial consultation and will return in 3 months to discuss saphenous vein ablation. 03/04/2023: His wraps slid down bilaterally and he is leaking clear fluid from the left leg. There is slough build up on the right lower leg wound, but the overall size is smaller. 03/11/2023: The area on his left leg where he was leaking fluid has closed. The right lower leg wound is smaller and clean. He does have his Farrow wraps with him today. 03/27/2023: At his nurse visit last week, his left medial ankle was found to be open. He has another new wound today on the left anterior tibial surface. The right ankle ulcer is smaller today. Everything is very superficial and clean. 04/04/2023: Despite being in bilateral 3 layer compression equivalent, he has developed new wounds. All of these seem to be secondary to blistering. He has a new wound on his right lateral lower leg just above the ankle and 2 new sites on his left anterolateral lower leg. These are fairly tender and a the skin is little bit erythematous around them. [name], [name] ([id number]) [id number].pdf Page 5 of 9 04/18/2023: His wounds are healed. READMISSION 07/24/2023 This is an 89 year old man we had in clinic from 01/11/2023 through 04/18/2023 with bilateral lower leg wounds felt to be secondary to bilateral chronic venous insufficiency. Eventually he healed up. He was discharged with Fabian November wraps and he had a vein and vascular follow-up. Indeed he did  see Dr. Myra Gianotti on 06/03/2023 and it was felt that he would be a candidate for right saphenous vein ablations. Shortly thereafter the patient reports that he developed rapid increase in edema in his legs right greater than left. More recently there was blistering and open wounds in the right posterior and anterior ankle area. He now has open wounds in this area. In discussion with him it is quite clear he was not wearing the Farrow wraps. I am not certain if it was an issue with him not being able to get them on himself or simply disinterested. He says at 1 point my legs were not swollen so I did not put them on. 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 The patient has a history of pulmonary embolism, spinal stenosis and chronic venous insufficiency. I do not think he is felt to have a significant arterial issue. Patient History Information obtained from Patient, Chart. Allergies amlodipine (Reaction: swelling), Lyrica (Reaction: swelling), gabapentin Family History Lung Disease - Siblings, Stroke - Siblings, No family history of Cancer, Diabetes, Heart Disease, Hereditary Spherocytosis, Hypertension, Kidney Disease, Seizures, Thyroid Problems, Tuberculosis. Social History Former smoker, Marital Status - Married, Alcohol Use - Moderate, Drug Use - No History, Caffeine Use - Rarely. Medical History Eyes Patient has history of Cataracts - bil extractions Denies history of Glaucoma, Optic Neuritis Ear/Nose/Mouth/Throat Denies history of Chronic sinus problems/congestion, Middle ear problems Cardiovascular Patient has history of Congestive Heart Failure, Coronary Artery Disease, Hypertension, Peripheral Venous Disease Endocrine Denies history of Type I Diabetes, Type II Diabetes Integumentary (Skin) Denies history of History of Burn Musculoskeletal Patient has history of Osteoarthritis Neurologic Patient has history of Neuropathy Oncologic Denies history  of Received Chemotherapy, Received Radiation Psychiatric Denies history of Anorexia/bulimia, Confinement Anxiety Hospitalization/Surgery History - bil carpal tunnel release. - cardiac cath. - kyphoplasty. - ORIF patella left. - left total knee arthroplasty. - posterior lumbar fusion. - lumbar laminectomy. - posterior cervical fusion. - appendectomy. - blepharoplasty. - cholecystectomy. - bil catract extraction. - right knee replacement. - melanoma excision. - rectal surgery fissure. - vitrectomy. Medical A Surgical History Notes nd Ear/Nose/Mouth/Throat seasonal allergies Musculoskeletal myositis, myalgia, left patella fracture, bursitis of hips, lumbar compression fx, spondylolistthesis Neurologic bil carpal tunnel Oncologic skin cancers Objective Constitutional Sitting or standing Blood Pressure is within target range for patient.. Pulse regular and within target range for patient.Marland Kitchen Respirations regular, non-labored and within target range.. Temperature is normal and within the target range for the patient.Marland Kitchen Appears in no distress. Vitals Time Taken: 12:48 PM, Temperature: 98.4 F, Pulse: 85 bpm, Respiratory Rate: 17 breaths/min, Blood Pressure: 103/56 mmHg. Respiratory work of breathing is normal. Bilateral breath sounds are clear and equal in all lobes with no wheezes, rales or rhonchi.BAYLON, SALB ([id number]) [id number] Page 6 of 9 Cardiovascular Heart rhythm and rate regular, without murmur or gallop.JVP was not elevated there was no sacral edema. Severe bilateral right greater than left pitting edema.. General Notes: Wound exam; he has circumferential blistering and skin breakdown on the distal lower leg and the ankle area. Skin is simply peeling off in this area. No clear evidence of infection Integumentary (Hair, Skin) Wound #8 status is Open. Original cause of wound was Gradually Appeared. The date acquired was: 07/04/2023. The wound is located  on the Right,Circumferential Lower Leg. The wound measures 14cm length x 32cm width x 0.1cm depth; 351.858cm^2 area and 35.186cm^3 volume. There is no tunneling or undermining noted. There is a  large amount of serosanguineous drainage noted. The wound margin is distinct with the outline attached to the wound base. There is large (67-100%) red, pink granulation within the wound bed. There is a small (1-33%) amount of necrotic tissue within the wound bed including Adherent Slough. The periwound skin appearance exhibited: Maceration, Hemosiderin Staining. The periwound skin appearance did not exhibit: Callus, Crepitus, Excoriation, Induration, Rash, Scarring, Dry/Scaly, Atrophie Blanche, Cyanosis, Ecchymosis, Mottled, Pallor, Rubor, Erythema. Periwound temperature was noted as No Abnormality. The periwound has tenderness on palpation. Assessment Active Problems ICD-10 Chronic venous hypertension (idiopathic) with ulcer and inflammation of bilateral lower extremity Lymphedema, not elsewhere classified Non-pressure chronic ulcer of other part of right lower leg with other specified severity Procedures Wound #8 Pre-procedure diagnosis of Wound #8 is a Venous Leg Ulcer located on the Right,Circumferential Lower Leg . There was a Three Layer Compression Therapy Procedure by Fonnie Mu, RN. Post procedure Diagnosis Wound #8: Same as Pre-Procedure There was a Three Layer Compression Therapy Procedure by Fonnie Mu, RN. Post procedure Diagnosis Wound #: Same as Pre-Procedure Plan Follow-up Appointments: Return appointment in 3 weeks. - w/ Dr. Lady Gary Nurse Visit: - for next 2 weeks Bathing/ Shower/ Hygiene: May shower with protection but do not get wound dressing(s) wet. Protect dressing(s) with water repellant cover (for example, large plastic bag) or a cast cover and may then take shower. Edema Control - Orders / Instructions: Elevate legs to the level of the heart or above for 30  minutes daily and/or when sitting for 3-4 times a day throughout the day. Avoid standing for long periods of time. Lymphedema Treatment Plan - Exercise, Compression and Elevation: Compression Wraps as ordered - urgo 2 lite bilateral leg wraps If legs are healed, may change to nurse visit. If one leg healed, pt. to bring farrow wrap to put on leg instead of wrap. Elevate legs 30 - 60 minutes at or above heart level at least 3 - 4 times daily as able/tolerated Avoid standing for long periods and elevate leg(s) parallel to the floor when sitting WOUND #8: - Lower Leg Wound Laterality: Right, Circumferential Cleanser: Soap and Water 1 x Per Week/30 Days Discharge Instructions: May shower and wash wound with dial antibacterial soap and water prior to dressing change. Peri-Wound Care: Zinc Oxide Ointment 30g tube 1 x Per Week/30 Days Discharge Instructions: Apply Zinc Oxide to periwound with each dressing change Prim Dressing: Maxorb Extra Ag+ Alginate Dressing, 4x4.75 (in/in) 1 x Per Week/30 Days ary Discharge Instructions: Apply to wound bed as instructed Secondary Dressing: ABD Pad, 5x9 1 x Per Week/30 Days Discharge Instructions: Apply over primary dressing as directed. Com pression Wrap: Urgo K2 Lite, (equivalent to a 3 layer) two layer compression system, regular 1 x Per Week/30 Days Discharge Instructions: Apply Urgo K2 Lite as directed (alternative to 3 layer compression). 1. I think all of this is secondary to uncontrolled edema right greater than left 2. We put silver alginate, drawtex, ABDs and Urgo K2 light compression on the right lower leg. We also put the left leg and Urgo K2 lite 3. I explained the patient we are going to have to find something that he is willing to wear or else this is going to happen recurrently. I am uncertain about his ability to put the wraps on himself. Whether that was the issue, or he simply just was not interested I am uncertain Electronic  Signature(s) CESARE, [place] E ([id number]) [id number] Page 7 of 9 Signed: 07/25/2023 5:28:21 PM By: Elesa Hacker,  Yvonne Kendall RN, BSN Signed: 07/29/2023 4:04:15 PM By: Baltazar Najjar MD Previous Signature: 07/24/2023 5:18:28 PM Version By: Baltazar Najjar MD Entered By: Shawn Stall on 07/25/2023 17:23:02 -------------------------------------------------------------------------------- HxROS Details Patient Name: Date of Service: [name] RD, Dickson E. 07/24/2023 12:30 PM Medical Record Number: [id number] Patient Account Number: [id number] Date of Birth/Sex: Treating RN: Feb 19, 1932 (89 y.o. Lucious Groves Primary Care Provider: Alcide Clever Other Clinician: Referring Provider: Treating Provider/Extender: Baltazar Najjar [facility], MICHELLE Weeks in Treatment: 0 Information Obtained From Patient Chart Eyes Medical History: Positive for: Cataracts - bil extractions Negative for: Glaucoma; Optic Neuritis Ear/Nose/Mouth/Throat Medical History: Negative for: Chronic sinus problems/congestion; Middle ear problems Past Medical History Notes: seasonal allergies Cardiovascular Medical History: Positive for: Congestive Heart Failure; Coronary Artery Disease; Hypertension; Peripheral Venous Disease Endocrine Medical History: Negative for: Type I Diabetes; Type II Diabetes Integumentary (Skin) Medical History: Negative for: History of Burn Musculoskeletal Medical History: Positive for: Osteoarthritis Past Medical History Notes: myositis, myalgia, left patella fracture, bursitis of hips, lumbar compression fx, spondylolistthesis Neurologic Medical History: Positive for: Neuropathy Past Medical History Notes: bil carpal tunnel Oncologic Medical History: Negative for: Received Chemotherapy; Received Radiation Past Medical History Notes: skin cancers Psychiatric Medical History: Negative for: Lanney Gins Anxiety [name], [name] E  ([id number]) [id number] Page 8 of 9 HBO Extended History Items Eyes: Cataracts Immunizations Pneumococcal Vaccine: Received Pneumococcal Vaccination: No Implantable Devices No devices added Hospitalization / Surgery History Type of Hospitalization/Surgery bil carpal tunnel release cardiac cath kyphoplasty ORIF patella left left total knee arthroplasty posterior lumbar fusion lumbar laminectomy posterior cervical fusion appendectomy blepharoplasty cholecystectomy bil catract extraction right knee replacement melanoma excision rectal surgery fissure vitrectomy Family and Social History Cancer: No; Diabetes: No; Heart Disease: No; Hereditary Spherocytosis: No; Hypertension: No; Kidney Disease: No; Lung Disease: Yes - Siblings; Seizures: No; Stroke: Yes - Siblings; Thyroid Problems: No; Tuberculosis: No; Former smoker; Marital Status - Married; Alcohol Use: Moderate; Drug Use: No History; Caffeine Use: Rarely; Financial Concerns: No; Food, Clothing or Shelter Needs: No; Support System Lacking: No; Transportation Concerns: 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 07/24/2023 Medical Record Number: [id number] Patient Account Number: [id number] Date of Birth/Sex: Treating RN: 1932/05/04 (89 y.o. M) Primary Care Provider: Alcide Clever Other Clinician: Referring Provider: Treating Provider/Extender: Baltazar Najjar [facility], MICHELLE Weeks in Treatment: 0 Diagnosis Coding ICD-10 Codes Code Description [phone] Chronic venous hypertension (idiopathic) with ulcer and inflammation of bilateral lower extremity I89.0  Lymphedema, not elsewhere classified L97.818 Non-pressure chronic ulcer of other part of right lower leg with other specified severity Facility Procedures : MAYO, MONTREUIL: Code [phone] [id number] Camdan E ([phone] [id number] foot Description: 3 - WOUND CARE VISIT-LEV 3 EST PT [id number]) [phone] 1 BILATERAL: Application of multi-layer venous compression system; leg (below knee), including ankle and . Modifier: [id number] Quantity: 1 227.pdf Page 9 of 9 1 Physician Procedures : CPT4 Code Description Modifier [phone] - WC PHYS LEVEL 4 - EST PT ICD-10 Diagnosis Description I87.333 Chronic venous hypertension (idiopathic) with ulcer and inflammation of bilateral lower extremity I89.0 Lymphedema, not elsewhere classified  L97.818 Non-pressure chronic ulcer of other part of right lower leg with other specified severity Quantity: 1 Electronic Signature(s) Signed: 07/24/2023 1:35:47 PM By: Fonnie Mu RN Signed: 07/24/2023 5:18:28 PM By: Baltazar Najjar MD Entered By: Fonnie Mu on 07/24/2023 13:35:46

## 2023-07-25 NOTE — Progress Notes (Signed)
GUISEPPE, LUMMIS ([id number]) [id number] Page 1 of 11 Visit Report for 07/24/2023 Allergy List Details Patient Name: Date of Service: [name] 07/24/2023 12:30 PM Medical Record Number: [id number] Patient Account Number: [id number] Date of Birth/Sex: Treating RN: 1931/09/18 (89 y.o. [name] Primary Care [name]: [name] Other Clinician: Referring [name]: Treating [name]/Extender: [name] [facility], [name] Weeks in Treatment: 0 Allergies Active Allergies amlodipine Reaction: swelling Lyrica Reaction: swelling gabapentin Allergy Notes Electronic Signature(s) Signed: 07/24/2023 4:44:05 PM By: Fonnie Mu RN Entered By: Fonnie Mu on 07/23/2023 07:34:07 -------------------------------------------------------------------------------- Arrival Information Details Patient Name: Date of Service: [name], Jordan E. 07/24/2023 12:30 PM Medical Record Number: [id number] Patient Account Number: [id number] Date of Birth/Sex: Treating RN: Jan 06, 1932 (89 y.o. [name], [name] Primary Care Empress Newmann: [name] Other Clinician: Referring Memori Sammon: Treating Leianna Barga/Extender: [name] [facility], [name] Weeks in Treatment: 0 Visit Information Patient Arrived: Dan Humphreys Arrival Time: 12:45 Accompanied By: self Transfer Assistance: None Patient Identification Verified: Yes Secondary Verification Process Completed: Yes Patient Requires Transmission-Based Precautions: No Patient Has Alerts: No History Since Last Visit Added or deleted any medications: No Any new allergies or adverse reactions: No Had a fall or experienced change in activities of daily living that may affect risk of falls: No Signs or symptoms of abuse/neglect since last visito No Hospitalized since last visit: No Implantable device outside of the clinic excluding cellular tissue based products placed in the center since last  visit: No Electronic Signature(s) Signed: 07/24/2023 4:44:05 PM By: Fonnie Mu RN Entered By: Fonnie Mu on 07/24/2023 09:45:31 Bouchillon, [name] ([id number]) [id number] Page 2 of 11 -------------------------------------------------------------------------------- Clinic Level of Care Assessment Details Patient Name: Date of Service: [name] 07/24/2023 12:30 PM Medical Record Number: [id number] Patient Account Number: [id number] Date of Birth/Sex: Treating RN: 01-18-1932 (89 y.o. [name], [name] Primary Care Evette Diclemente: [name] Other Clinician: Referring Karon Heckendorn: Treating Dorianna Mckiver/Extender: [name] [facility], [name] Weeks in Treatment: 0 Clinic Level of Care Assessment Items TOOL 3 Quantity Score X- 1 0 Use when EandM and Procedure is performed on FOLLOW-UP visit ASSESSMENTS - Nursing Assessment / Reassessment X- 1 10 Reassessment of Co-morbidities (includes updates in patient status) X- 1 5 Reassessment of Adherence to Treatment Plan ASSESSMENTS - Wound and Skin Assessment / Reassessment []  - Points for Wound Assessment can only be taken for a new wound of unknown or different etiology and a procedure is 0 NOT performed to that wound X- 1 5 Simple Wound Assessment / Reassessment - one wound []  - 0 Complex Wound Assessment / Reassessment - multiple wounds []  - 0 Dermatologic / Skin Assessment (not related to wound area) ASSESSMENTS - Focused Assessment X- 1 5 Circumferential Edema Measurements - multi extremities []  - 0 Nutritional Assessment / Counseling / Intervention []  - 0 Lower Extremity Assessment (monofilament, tuning fork, pulses) []  - 0 Peripheral Arterial Disease Assessment (using hand held doppler) ASSESSMENTS - Ostomy and/or Continence Assessment and Care []  - 0 Incontinence Assessment and Management []  - 0 Ostomy Care Assessment and Management (repouching, etc.) PROCESS - Coordination  of Care []  - Points for Discharge Coordination can only be taken for a new wound of unknown or different etiology and a procedure 0 is NOT performed to that wound X- 1 15 Simple Patient / Family Education for ongoing care []  - 0 Complex (extensive) Patient / Family Education for ongoing care X- 1 10 Staff obtains Consents, Records, T Results / Process Orders est []  - 0 Staff telephones  HHA, Nursing Homes / Clarify orders / etc []  - 0 Routine Transfer to another Facility (non-emergent condition) []  - 0 Routine Hospital Admission (non-emergent condition) X- 1 15 New Admissions / Manufacturing engineer / Ordering NPWT Apligraf, etc. , []  - 0 Emergency Hospital Admission (emergent condition) X- 1 10 Simple Discharge Coordination []  - 0 Complex (extensive) Discharge Coordination PROCESS - Special Needs []  - 0 Pediatric / Minor Patient Management []  - 0 Isolation Patient Management []  - 0 Hearing / Language / Visual special needs []  - 0 Assessment of Community assistance (transportation, D/C planning, etc.) SCOTTI, LARACUENTE E ([id number]) [id number] Page 3 of 11 []  - 0 Additional assistance / Altered mentation []  - 0 Support Surface(s) Assessment (bed, cushion, seat, etc.) INTERVENTIONS - Wound Cleansing / Measurement []  - Points for Wound Cleaning / Measurement, Wound Dressing, Specimen Collection and Specimen taken to lab can only 0 be taken for a new wound of unknown or different etiology and a procedure is NOT performed to that wound X- 1 5 Simple Wound Cleansing - one wound []  - 0 Complex Wound Cleansing - multiple wounds X- 1 5 Wound Imaging (photographs - any number of wounds) []  - 0 Wound Tracing (instead of photographs) X- 1 5 Simple Wound Measurement - one wound []  - 0 Complex Wound Measurement - multiple wounds INTERVENTIONS - Wound Dressings X - Small Wound Dressing one or multiple wounds 1 10 []  - 0 Medium Wound Dressing one  or multiple wounds []  - 0 Large Wound Dressing one or multiple wounds INTERVENTIONS - Miscellaneous []  - 0 External ear exam []  - 0 Specimen Collection (cultures, biopsies, blood, body fluids, etc.) []  - 0 Specimen(s) / Culture(s) sent or taken to Lab for analysis []  - 0 Patient Transfer (multiple staff / Nurse, adult / Similar devices) []  - 0 Simple Staple / Suture removal (25 or less) []  - 0 Complex Staple / Suture removal (26 or more) []  - 0 Hypo / Hyperglycemic Management (close monitor of Blood Glucose) []  - 0 Ankle / Brachial Index (ABI) - do not check if billed separately X- 1 5 Vital Signs Has the patient been seen at the hospital within the last three years: Yes Total Score: 105 Level Of Care: New/Established - Level 3 Electronic Signature(s) Signed: 07/24/2023 4:44:05 PM By: Fonnie Mu RN Entered By: Fonnie Mu on 07/24/2023 10:35:35 -------------------------------------------------------------------------------- Compression Therapy Details Patient Name: Date of Service: [name], Jordan E. 07/24/2023 12:30 PM Medical Record Number: [id number] Patient Account Number: [id number] Date of Birth/Sex: Treating RN: 10/17/1931 (89 y.o. [name] Primary Care Duilio Heritage: [name] Other Clinician: Referring Arminta Gamm: Treating Nicolet Griffy/Extender: [name] [facility], [name] Weeks in Treatment: 0 Compression Therapy Performed for Wound Assessment: Wound #8 Right,Circumferential Lower Leg Performed By: Clinician Fonnie Mu, RN Compression Type: Three Layer Post Procedure Diagnosis Same as Pre-procedure Electronic Signature(s) VERAL, CIMO E ([id number]) [id number] Page 4 of 11 Signed: 07/24/2023 4:44:05 PM By: Fonnie Mu RN Entered By: Fonnie Mu on 07/24/2023 10:11:17 -------------------------------------------------------------------------------- Compression Therapy Details Patient  Name: Date of Service: [name], Jordan E. 07/24/2023 12:30 PM Medical Record Number: [id number] Patient Account Number: [id number] Date of Birth/Sex: Treating RN: [birth year] (89 y.o. [name] Primary Care Chason Mciver: [name] Other Clinician: Referring Bergen Melle: Treating Deunte Bledsoe/Extender: [name] [facility], [name] Weeks in Treatment: 0 Compression Therapy Performed for Wound Assessment: NonWound Condition Lymphedema - Bilateral Leg Performed By: Clinician Fonnie Mu, RN Compression Type: Three Layer Post Procedure Diagnosis Same as Pre-procedure Electronic Signature(s) Signed:  07/24/2023 4:44:05 PM By: Fonnie Mu RN Entered By: Fonnie Mu on 07/24/2023 10:11:29 -------------------------------------------------------------------------------- Encounter Discharge Information Details Patient Name: Date of Service: [name], Jordan E. 07/24/2023 12:30 PM Medical Record Number: [id number] Patient Account Number: [id number] Date of Birth/Sex: Treating RN: [birth year] (89 y.o. [name], [name] Primary Care Oree Mirelez: [name] Other Clinician: Referring Vyctoria Dickman: Treating Oryon Gary/Extender: [name] [facility], [name] Weeks in Treatment: 0 Encounter Discharge Information Items Discharge Condition: Stable Ambulatory Status: Walker Discharge Destination: Home Transportation: Private Auto Accompanied By: self Schedule Follow-up Appointment: Yes Clinical Summary of Care: Patient Declined Electronic Signature(s) Signed: 07/24/2023 1:36:26 PM By: Fonnie Mu RN Entered By: Fonnie Mu on 07/24/2023 10:36:26 -------------------------------------------------------------------------------- Lower Extremity Assessment Details Patient Name: Date of Service: [name], Jordan E. 07/24/2023 12:30 PM Medical Record Number: [id number] Patient Account Number: [id number] Date of Birth/Sex: Treating RN: 04-22-1932 (89 y.o. Jaykwon, Bordner, [name] ([id number]) [id number] Page 5 of 11 Primary Care Embry Manrique: [name] Other Clinician: Referring Emmilyn Crooke: Treating Montserrath Madding/Extender: [name] [facility], [name] Weeks in Treatment: 0 Edema Assessment Assessed: [Left: Yes] [Right: Yes] Edema: [Left: Yes] [Right: Yes] Calf Left: Right: Point of Measurement: From Medial Instep 36 cm 42 cm Ankle Left: Right: Point of Measurement: From Medial Instep 24.5 cm 28 cm Knee To Floor Left: Right: From Medial Instep 45 cm Vascular Assessment Pulses: Dorsalis Pedis Palpable: [Left:Yes] [Right:Yes] Posterior Tibial Palpable: [Left:Yes] [Right:Yes] Extremity colors, hair growth, and conditions: Extremity Color: [Left:Normal] [Right:Normal] Hair Growth on Extremity: [Left:No] [Right:No] Temperature of Extremity: [Left:Warm] [Right:Warm] Capillary Refill: [Left:< 3 seconds] [Right:< 3 seconds] Dependent Rubor: [Left:No] [Right:No] Blanched when Elevated: [Left:No No] [Right:No No] Toe Nail Assessment Left: Right: Thick: Yes Yes Discolored: Yes Yes Deformed: Yes Yes Improper Length and Hygiene: Yes Yes Electronic Signature(s) Signed: 07/24/2023 4:44:05 PM By: Fonnie Mu RN Entered By: Fonnie Mu on 07/24/2023 09:51:53 -------------------------------------------------------------------------------- Multi Wound Chart Details Patient Name: Date of Service: [name], Jordan E. 07/24/2023 12:30 PM Medical Record Number: [id number] Patient Account Number: [id number] Date of Birth/Sex: Treating RN: 15-Apr-1932 (89 y.o. M) Primary Care Ricco Dershem: [name] Other Clinician: Referring Riyanna Crutchley: Treating Mildred Bollard/Extender: [name] [facility], [name] Weeks in Treatment: 0 Vital Signs Height(in): Pulse(bpm): 85 Weight(lbs): Blood Pressure(mmHg): 103/56 Body Mass Index(BMI): Temperature(F): 98.4 Respiratory Rate(breaths/min):  17 Dieu, Lathan E ([id number]) [id number] Page 6 of 11 [8:Photos:] [N/A:N/A] Right, Circumferential Lower Leg N/A N/A Wound Location: Gradually Appeared N/A N/A Wounding Event: Venous Leg Ulcer N/A N/A Primary Etiology: Cataracts, Congestive Heart Failure, N/A N/A Comorbid History: Coronary Artery Disease, Hypertension, Peripheral Venous Disease, Osteoarthritis, Neuropathy 07/04/2023 N/A N/A Date Acquired: 0 N/A N/A Weeks of Treatment: Open N/A N/A Wound Status: No N/A N/A Wound Recurrence: 14x32x0.1 N/A N/A Measurements L x W x D (cm) 351.858 N/A N/A A (cm) : rea 35.186 N/A N/A Volume (cm) : Full Thickness Without Exposed N/A N/A Classification: Support Structures Large N/A N/A Exudate Amount: Serosanguineous N/A N/A Exudate Type: red, brown N/A N/A Exudate Color: Distinct, outline attached N/A N/A Wound Margin: Large (67-100%) N/A N/A Granulation Amount: Red, Pink N/A N/A Granulation Quality: Small (1-33%) N/A N/A Necrotic Amount: Fascia: No N/A N/A Exposed Structures: Fat Layer (Subcutaneous Tissue): No Tendon: No Muscle: No Joint: No Bone: No None N/A N/A Epithelialization: Excoriation: No N/A N/A Periwound Skin Texture: Induration: No Callus: No Crepitus: No Rash: No Scarring: No Maceration: Yes N/A N/A Periwound Skin Moisture: Dry/Scaly: No Hemosiderin Staining: Yes N/A N/A Periwound Skin Color: Atrophie Blanche: No Cyanosis: No Ecchymosis: No Erythema:  No Mottled: No Pallor: No Rubor: No No Abnormality N/A N/A Temperature: Yes N/A N/A Tenderness on Palpation: Compression Therapy N/A N/A Procedures Performed: Treatment Notes Electronic Signature(s) Signed: 07/24/2023 5:18:28 PM By: Jordan Najjar MD Entered By: [name] on 07/24/2023 10:22:17 -------------------------------------------------------------------------------- Multi-Disciplinary Care Plan Details Patient Name: Date of  Service: [name], Jordan E. 07/24/2023 12:30 PM Medical Record Number: [id number] Patient Account Number: [id number] Date of Birth/Sex: Treating RN: [birth year] (89 y.o. Delayne, Petra, [name] ([id number]) [id number] Page 7 of 11 Primary Care Rolande Moe: [name] Other Clinician: Referring Marquesha Robideau: Treating Tyjai Charbonnet/Extender: [name] [facility], [name] Weeks in Treatment: 0 Active Inactive Orientation to the Wound Care Program Nursing Diagnoses: Knowledge deficit related to the wound healing center program Goals: Patient/caregiver will verbalize understanding of the Wound Healing Center Program Date Initiated: 07/24/2023 Target Resolution Date: 08/15/2023 Goal Status: Active Interventions: Provide education on orientation to the wound center Notes: Wound/Skin Impairment Nursing Diagnoses: Impaired tissue integrity Knowledge deficit related to ulceration/compromised skin integrity Goals: Patient will have a decrease in wound volume by X% from date: (specify in notes) Date Initiated: 07/24/2023 Target Resolution Date: 08/15/2023 Goal Status: Active Patient/caregiver will verbalize understanding of skin care regimen Date Initiated: 07/24/2023 Target Resolution Date: 08/17/2023 Goal Status: Active Ulcer/skin breakdown will have a volume reduction of 30% by week 4 Date Initiated: 07/24/2023 Target Resolution Date: 08/17/2023 Goal Status: Active Interventions: Assess patient/caregiver ability to obtain necessary supplies Assess patient/caregiver ability to perform ulcer/skin care regimen upon admission and as needed Assess ulceration(s) every visit Notes: Electronic Signature(s) Signed: 07/24/2023 4:44:05 PM By: Fonnie Mu RN Entered By: Fonnie Mu on 07/24/2023 10:09:53 -------------------------------------------------------------------------------- Non-Wound Condition Assessment Details Patient Name: Date of  Service: [name], Jordan E. 07/24/2023 12:30 PM Medical Record Number: [id number] Patient Account Number: [id number] Date of Birth/Sex: Treating RN: [birth year] (89 y.o. [name] Primary Care Siddharth Babington: [name] Other Clinician: Referring Brekyn Huntoon: Treating Miriam Liles/Extender: [name] [facility], [name] Weeks in Treatment: 0 Non-Wound Condition: Condition: Lymphedema Location: Leg Side: Bilateral Periwound Skin Texture Texture Color RONTE, GRAVELINE E ([id number]) [id number] Page 8 of 11 No Abnormalities Noted: No No Abnormalities Noted: No Callus: No Atrophie Blanche: No Crepitus: No Cyanosis: No Excoriation: Yes Ecchymosis: No Friable: No Erythema: No Induration: No Hemosiderin Staining: Yes Rash: No Mottled: No Scarring: No Pallor: No Rubor: No Moisture No Abnormalities Noted: No Temperature / Pain Dry / Scaly: No Temperature: No Abnormality Maceration: Yes Tenderness on Palpation: Yes Lymphedema Assessment Edema - Progressive, Edema - Unable to Control, Hyperpigmentation, Symptoms: (check all that apply) Lymphorrhea (weeping) Electronic Signature(s) Signed: 07/24/2023 4:44:05 PM By: Fonnie Mu RN Entered By: Fonnie Mu on 07/24/2023 09:56:57 -------------------------------------------------------------------------------- Pain Assessment Details Patient Name: Date of Service: [name], Jordan E. 07/24/2023 12:30 PM Medical Record Number: [id number] Patient Account Number: [id number] Date of Birth/Sex: Treating RN: 04-01-1932 (89 y.o. [name], [name] Primary Care Doyle Kunath: [name] Other Clinician: Referring Eather Chaires: Treating Deantae Shackleton/Extender: [name] [facility], [name] Weeks in Treatment: 0 Active Problems Location of Pain Severity and Description of Pain Patient Has Paino No Site Locations Pain Management and Medication Current Pain Management: Electronic  Signature(s) Signed: 07/24/2023 4:44:05 PM By: Fonnie Mu RN Entered By: Fonnie Mu on 07/24/2023 09:48:35 Locatelli, Kable E ([id number]) [id number] Page 9 of 11 -------------------------------------------------------------------------------- Patient/Caregiver Education Details Patient Name: Date of Service: Shaniko [name] 12/11/2024andnbsp12:30 PM Medical Record Number: [id number] Patient Account Number: [id number] Date of Birth/Gender: Treating RN: 1931/09/05 (89 y.o. [name] Primary Care Physician:  SCHMERGE, [name] Other Clinician: Referring Physician: Treating Physician/Extender: Hollie Salk, [name] Weeks in Treatment: 0 Education Assessment Education Provided To: Patient Education Topics Provided Wound/Skin Impairment: Methods: Explain/Verbal Responses: Reinforcements needed, State content correctly Electronic Signature(s) Signed: 07/24/2023 4:44:05 PM By: Fonnie Mu RN Entered By: Fonnie Mu on 07/24/2023 10:10:32 -------------------------------------------------------------------------------- Wound Assessment Details Patient Name: Date of Service: [name], Oday E. 07/24/2023 12:30 PM Medical Record Number: [id number] Patient Account Number: [id number] Date of Birth/Sex: Treating RN: 1932-04-29 (89 y.o. [name], [name] Primary Care Talaysha Freeberg: [name] Other Clinician: Referring Le Faulcon: Treating Sadey Yandell/Extender: [name] [facility], [name] Weeks in Treatment: 0 Wound Status Wound Number: 8 Primary Venous Leg Ulcer Etiology: Wound Location: Right, Circumferential Lower Leg Wound Open Wounding Event: Gradually Appeared Status: Date Acquired: 07/04/2023 Comorbid Cataracts, Congestive Heart Failure, Coronary Artery Disease, Weeks Of Treatment: 0 History: Hypertension, Peripheral Venous Disease, Osteoarthritis, Clustered Wound: No Neuropathy Photos Wound  Measurements Length: (cm) 14 Width: (cm) 32 Deegan, Townsend E ([id number]) Depth: (cm) 0.1 Area: (cm) 351.8 Volume: (cm) 35.18 % Reduction in Area: % Reduction in Volume: [id number] Page 10 of 11 Epithelialization: None 58 Tunneling: No 6 Undermining: No Wound Description Classification: Full Thickness Without Exposed Support Structures Wound Margin: Distinct, outline attached Exudate Amount: Large Exudate Type: Serosanguineous Exudate Color: red, brown Foul Odor After Cleansing: No Slough/Fibrino Yes Wound Bed Granulation Amount: Large (67-100%) Exposed Structure Granulation Quality: Red, Pink Fascia Exposed: No Necrotic Amount: Small (1-33%) Fat Layer (Subcutaneous Tissue) Exposed: No Necrotic Quality: Adherent Slough Tendon Exposed: No Muscle Exposed: No Joint Exposed: No Bone Exposed: No Periwound Skin Texture Texture Color No Abnormalities Noted: No No Abnormalities Noted: No Callus: No Atrophie Blanche: No Crepitus: No Cyanosis: No Excoriation: No Ecchymosis: No Induration: No Erythema: No Rash: No Hemosiderin Staining: Yes Scarring: No Mottled: No Pallor: No Moisture Rubor: No No Abnormalities Noted: No Dry / Scaly: No Temperature / Pain Maceration: Yes Temperature: No Abnormality Tenderness on Palpation: Yes Treatment Notes Wound #8 (Lower Leg) Wound Laterality: Right, Circumferential Cleanser Soap and Water Discharge Instruction: May shower and wash wound with dial antibacterial soap and water prior to dressing change. Peri-Wound Care Zinc Oxide Ointment 30g tube Discharge Instruction: Apply Zinc Oxide to periwound with each dressing change Topical Primary Dressing Maxorb Extra Ag+ Alginate Dressing, 4x4.75 (in/in) Discharge Instruction: Apply to wound bed as instructed Secondary Dressing ABD Pad, 5x9 Discharge Instruction: Apply over primary dressing as directed. Secured With Compression Wrap Urgo K2 Lite,  (equivalent to a 3 layer) two layer compression system, regular Discharge Instruction: Apply Urgo K2 Lite as directed (alternative to 3 layer compression). Compression Stockings Add-Ons Electronic Signature(s) Signed: 07/24/2023 4:44:05 PM By: Fonnie Mu RN Entered By: Fonnie Mu on 07/24/2023 09:58:52 Mariea Clonts E ([id number]) [id number] Page 11 of 11 -------------------------------------------------------------------------------- Vitals Details Patient Name: Date of Service: [place] [name] 07/24/2023 12:30 PM Medical Record Number: [id number] Patient Account Number: [id number] Date of Birth/Sex: Treating RN: 01-Jul-1932 (89 y.o. [name], [name] Primary Care Perrie Ragin: [name] Other Clinician: Referring Surie Suchocki: Treating Cassady Stanczak/Extender: [name] [facility], [name] Weeks in Treatment: 0 Vital Signs Time Taken: 12:48 Temperature (F): 98.4 Pulse (bpm): 85 Respiratory Rate (breaths/min): 17 Blood Pressure (mmHg): 103/56 Reference Range: 80 - 120 mg / dl Electronic Signature(s) Signed: 07/24/2023 4:44:05 PM By: Fonnie Mu RN Entered By: Fonnie Mu on 07/24/2023 09:48:25

## 2023-07-31 ENCOUNTER — Encounter (HOSPITAL_BASED_OUTPATIENT_CLINIC_OR_DEPARTMENT_OTHER): Payer: Medicare Other | Admitting: Internal Medicine

## 2023-07-31 DIAGNOSIS — I87333 Chronic venous hypertension (idiopathic) with ulcer and inflammation of bilateral lower extremity: Secondary | ICD-10-CM | POA: Diagnosis not present

## 2023-08-01 NOTE — Progress Notes (Signed)
DETAVIOUS, [name] ([id number]) [id number] Page 1 of 4 Visit Report for 07/31/2023 Arrival Information Details Patient Name: Date of Service: [place] [name] 07/31/2023 3:45 PM Medical Record Number: [id number] Patient Account Number: [id number] Date of Birth/Sex: Treating RN: 03/04/1932 (89 y.o. M) Primary Care [name]: Alcide Clever Other Clinician: Thayer Dallas Referring Ketura Sirek: Treating Monette Omara/Extender: Baltazar Najjar [facility], MICHELLE Weeks in Treatment: 1 Visit Information History Since Last Visit Added or deleted any medications: No Patient Arrived: Walker Any new allergies or adverse reactions: No Arrival Time: 16:30 Had a fall or experienced change in No Accompanied By: self activities of daily living that may affect Transfer Assistance: None risk of falls: Patient Identification Verified: Yes Signs or symptoms of abuse/neglect since last visito No Secondary Verification Process Completed: Yes Hospitalized since last visit: No Patient Requires Transmission-Based Precautions: No Has Dressing in Place as Prescribed: Yes Patient Has Alerts: No Has Compression in Place as Prescribed: Yes Pain Present Now: No Electronic Signature(s) Signed: 07/31/2023 5:28:18 PM By: Thayer Dallas Entered By: Thayer Dallas on 07/31/2023 17:24:20 -------------------------------------------------------------------------------- Compression Therapy Details Patient Name: Date of Service: [name] 07/31/2023 3:45 PM Medical Record Number: [id number] Patient Account Number: [id number] Date of Birth/Sex: Treating RN: 14-Oct-1931 (89 y.o. M) Primary Care Wynston Romey: Alcide Clever Other Clinician: Referring Smiley Birr: Treating Libby Goehring/Extender: Baltazar Najjar [place], MICHELLE Weeks in Treatment: 1 Compression Therapy Performed for Wound Assessment: Wound #8 Right,Circumferential Lower Leg Performed By: Clinician Thayer Dallas, Compression Type: Double Layer Electronic Signature(s) Signed: 07/31/2023 5:28:18 PM By: Thayer Dallas Entered By: Thayer Dallas on 07/31/2023 17:25:06 -------------------------------------------------------------------------------- Compression Therapy Details Patient Name: Date of Service: [name] 07/31/2023 3:45 PM Medical Record Number: [id number] Patient Account Number: [id number] Date of Birth/Sex: Treating RN: [birth year] (89 y.o. M) Primary Care Mairany Bruno: Alcide Clever Other Clinician: LABRADFORD, SAINZ ([id number]) [id number] Page 2 of 4 Referring Kinnick Maus: Treating Zabian Swayne/Extender: Baltazar Najjar [facility], MICHELLE Weeks in Treatment: 1 Compression Therapy Performed for Wound Assessment: NonWound Condition Lymphedema - Bilateral Leg Performed By: Clinician Thayer Dallas, Compression Type: Double Layer Electronic Signature(s) Signed: 07/31/2023 5:28:18 PM By: Thayer Dallas Entered By: Thayer Dallas on 07/31/2023 17:25:21 -------------------------------------------------------------------------------- Encounter Discharge Information Details Patient Name: Date of Service: [name], [name] 07/31/2023 3:45 PM Medical Record Number: [id number] Patient Account Number: [id number] Date of Birth/Sex: Treating RN: 04/05/1932 (89 y.o. M) Primary Care Coury Grieger: Alcide Clever Other Clinician: Thayer Dallas Referring Astin Sayre: Treating Disha Cottam/Extender: Baltazar Najjar [facility], MICHELLE Weeks in Treatment: 1 Encounter Discharge Information Items Discharge Condition: Stable Ambulatory Status: Walker Discharge Destination: Home Transportation: Private Auto Accompanied By: self Schedule Follow-up Appointment: Yes Clinical Summary of Care: Electronic Signature(s) Signed: 07/31/2023 5:28:18 PM By: Thayer Dallas Entered By: Thayer Dallas on 07/31/2023  17:26:30 -------------------------------------------------------------------------------- Patient/Caregiver Education Details Patient Name: Date of Service: [name] 12/18/2024andnbsp3:45 PM Medical Record Number: [id number] Patient Account Number: [id number] Date of Birth/Gender: Treating RN: 1931-10-05 (89 y.o. M) Primary Care Physician: Alcide Clever Other Clinician: Thayer Dallas Referring Physician: Treating Physician/Extender: Daine Gravel Weeks in Treatment: 1 Education Assessment Education Provided To: Patient Education Topics Provided Electronic Signature(s) Signed: 07/31/2023 5:28:18 PM By: Thayer Dallas Entered By: Thayer Dallas on 07/31/2023 17:25:48 [name] ([id number]) [id number] Page 3 of 4 -------------------------------------------------------------------------------- Wound Assessment Details Patient Name: Date of Service: [name] 07/31/2023 3:45 PM Medical Record Number: [id number] Patient Account Number: [id number] Date of Birth/Sex: Treating RN: [birth year] (89 y.o. M) Primary Care Sawsan Riggio: Alcide Clever Other Clinician:  Referring Aaniya Sterba: Treating Cierah Crader/Extender: Baltazar Najjar [facility], MICHELLE Weeks in Treatment: 1 Wound Status Wound Number: 8 Primary Etiology: Venous Leg Ulcer Wound Location: Right, Circumferential Lower Leg Wound Status: Open Wounding Event: Gradually Appeared Date Acquired: 07/04/2023 Weeks Of Treatment: 1 Clustered Wound: No Wound Measurements Length: (cm) 14 Width: (cm) 32 Depth: (cm) 0.1 Area: (cm) 351.858 Volume: (cm) 35.186 % Reduction in Area: 0% % Reduction in Volume: 0% Wound Description Classification: Full Thickness Without Exposed Suppor Exudate Amount: Large Exudate Type: Serosanguineous Exudate Color: red, brown t Structures Periwound Skin Texture Texture Color No Abnormalities Noted: No No Abnormalities Noted:  No Moisture No Abnormalities Noted: No Treatment Notes Wound #8 (Lower Leg) Wound Laterality: Right, Circumferential Cleanser Soap and Water Discharge Instruction: May shower and wash wound with dial antibacterial soap and water prior to dressing change. Peri-Wound Care Zinc Oxide Ointment 30g tube Discharge Instruction: Apply Zinc Oxide to periwound with each dressing change Topical Primary Dressing Maxorb Extra Ag+ Alginate Dressing, 4x4.75 (in/in) Discharge Instruction: Apply to wound bed as instructed Secondary Dressing ABD Pad, 5x9 Discharge Instruction: Apply over primary dressing as directed. Secured With Compression Wrap Urgo K2 Lite, (equivalent to a 3 layer) two layer compression system, regular Discharge Instruction: Apply Urgo K2 Lite as directed (alternative to 3 layer compression). Compression Stockings Add-Ons CAULDER, GOTZ ([id number]) [phone].pdf Page 4 of 4 Electronic Signature(s) Signed: 07/31/2023 5:28:18 PM By: Thayer Dallas Entered By: Thayer Dallas on 07/31/2023 17:24:38 -------------------------------------------------------------------------------- Vitals Details Patient Name: Date of Service: [name], [place] E. 07/31/2023 3:45 PM Medical Record Number: [id number] Patient Account Number: [id number] Date of Birth/Sex: Treating RN: 1932/02/10 (89 y.o. M) Primary Care Naylee Frankowski: Alcide Clever Other Clinician: Thayer Dallas Referring Ademola Vert: Treating Danille Oppedisano/Extender: Baltazar Najjar [facility], MICHELLE Weeks in Treatment: 1 Vital Signs Time Taken: 16:45 Reference Range: 80 - 120 mg / dl Electronic Signature(s) Signed: 07/31/2023 5:28:18 PM By: Thayer Dallas Entered By: Thayer Dallas on 07/31/2023 17:24:32

## 2023-08-02 NOTE — Progress Notes (Signed)
JKOBE, CONNIFF ([id number]) [id number] Page 1 of 1 Visit Report for 07/31/2023 SuperBill Details Patient Name: Date of Service: [place] 07/31/2023 Medical Record Number: [id number] Patient Account Number: [id number] Date of Birth/Sex: Treating RN: [birth year] (89 y.o. M) Primary Care Provider: Alcide Clever Other Clinician: Thayer Dallas Referring Provider: Treating Provider/Extender: Baltazar Najjar [facility], MICHELLE Weeks in Treatment: 1 Diagnosis Coding ICD-10 Codes Code Description [phone] Chronic venous hypertension (idiopathic) with ulcer and inflammation of bilateral lower extremity I89.0 Lymphedema, not elsewhere classified L97.818 Non-pressure chronic ulcer of other part of right lower leg with other specified severity Facility Procedures CPT4 Description Modifier Quantity Code [id number] BILATERAL: Application of multi-layer venous compression system; leg (below knee), including ankle and 1 foot. Electronic Signature(s) Signed: 07/31/2023 5:28:18 PM By: Thayer Dallas Signed: 08/01/2023 12:11:14 PM By: Baltazar Najjar MD Entered By: Thayer Dallas on 07/31/2023 17:27:10

## 2023-08-05 ENCOUNTER — Encounter (HOSPITAL_BASED_OUTPATIENT_CLINIC_OR_DEPARTMENT_OTHER): Payer: Medicare Other | Admitting: General Surgery

## 2023-08-05 DIAGNOSIS — I87333 Chronic venous hypertension (idiopathic) with ulcer and inflammation of bilateral lower extremity: Secondary | ICD-10-CM | POA: Diagnosis not present

## 2023-08-06 NOTE — Progress Notes (Signed)
SKYELER, GOODLUCK ([id number]) [id number] Page 1 of 1 Visit Report for 08/05/2023 SuperBill Details Patient Name: Date of Service: [place] 08/05/2023 Medical Record Number: [id number] Patient Account Number: [id number] Date of Birth/Sex: Treating RN: [birth year] (89 y.o. Damaris Schooner Primary Care Provider: Alcide Clever Other Clinician: Referring Provider: Treating Provider/Extender: Duanne Guess [facility], MICHELLE Weeks in Treatment: 1 Diagnosis Coding ICD-10 Codes Code Description [phone] Chronic venous hypertension (idiopathic) with ulcer and inflammation of bilateral lower extremity I89.0 Lymphedema, not elsewhere classified L97.818 Non-pressure chronic ulcer of other part of right lower leg with other specified severity Facility Procedures CPT4 Description Modifier Quantity Code [id number] BILATERAL: Application of multi-layer venous compression system; leg (below knee), including ankle and 1 foot. Electronic Signature(s) Signed: 08/05/2023 4:28:38 PM By: Duanne Guess MD FACS Signed: 08/05/2023 4:30:16 PM By: Zenaida Deed RN, BSN Entered By: Zenaida Deed on 08/05/2023 16:01:05

## 2023-08-06 NOTE — Progress Notes (Signed)
**Note Jordan-Identified via Obfuscation** [name], [name] ([id number]) [phone].pdf Page 1 of 6 Visit Report for 08/05/2023 Arrival Information Details Patient Name: Date of Service: [name] 08/05/2023 3:30 PM Medical Record Number: [id number] Patient Account Number: [id number] Date of Birth/Sex: Treating RN: August 20, 1931 (89 y.o. [name], [name] Primary Care [name]: [name] Other Clinician: Referring [name]: Treating [name]/Extender: Duanne [name] [name], [name] in Treatment: 1 Visit Information History Since Last Visit Added or deleted any medications: No Patient Arrived: [name] Any new allergies or adverse reactions: No Arrival Time: 15:30 Had a fall or experienced change in No Accompanied By: self activities of daily living that may affect Transfer Assistance: None risk of falls: Patient Identification Verified: Yes Signs or symptoms of abuse/neglect since last visito No Secondary Verification Process Completed: Yes Hospitalized since last visit: No Patient Requires Transmission-Based Precautions: No Implantable device outside of the clinic excluding No Patient Has Alerts: No cellular tissue based products placed in the center since last visit: Has Dressing in Place as Prescribed: Yes Has Compression in Place as Prescribed: Yes Pain Present Now: Yes Electronic Signature(s) Signed: 08/05/2023 4:30:16 PM By: Jordan Deed RN, BSN Entered By: [name] on 08/05/2023 15:58:16 -------------------------------------------------------------------------------- Compression Therapy Details Patient Name: Date of Service: [name], [name] 08/05/2023 3:30 PM Medical Record Number: [id number] Patient Account Number: [id number] Date of Birth/Sex: Treating RN: 1932/03/16 (89 y.o. [name] Primary Care Ambika Zettlemoyer: [name] Other Clinician: Referring [name]: Treating [name]/Extender: Duanne [name], [name] in  Treatment: 1 Compression Therapy Performed for Wound Assessment: Wound #8 Right,Circumferential Lower Leg Performed By: Clinician Jordan Deed, RN Compression Type: Double Layer Notes [name] Electronic Signature(s) Signed: 08/05/2023 4:30:16 PM By: Jordan Deed RN, BSN Entered By: [name] on 08/05/2023 15:59:36 [name] ([id number]) [id number] Page 2 of 6 -------------------------------------------------------------------------------- Compression Therapy Details Patient Name: Date of Service: [name] 08/05/2023 3:30 PM Medical Record Number: [id number] Patient Account Number: [id number] Date of Birth/Sex: Treating RN: [birth year] (89 y.o. [name] Primary Care Lodie Waheed: [name] Other Clinician: Referring Stacey Sago: Treating [name]/Extender: Duanne [name] [name], [name] in Treatment: 1 Compression Therapy Performed for Wound Assessment: Non-Wound Location Performed By: Clinician Jordan Deed, RN Compression Type: Double Layer Location: Lower Extremity, Left Notes urgo lite Electronic Signature(s) Signed: 08/05/2023 4:30:16 PM By: Jordan Deed RN, BSN Entered By: [name] on 08/05/2023 16:00:02 -------------------------------------------------------------------------------- Encounter Discharge Information Details Patient Name: Date of Service: [name], [name] 08/05/2023 3:30 PM Medical Record Number: [id number] Patient Account Number: [id number] Date of Birth/Sex: Treating RN: 1932/01/12 (89 y.o. [name] Primary Care Monserrate Blaschke: [name] Other Clinician: Referring Keniah Klemmer: Treating Kellsey Sansone/Extender: Duanne [name] [facility], [name] in Treatment: 1 Encounter Discharge Information Items Discharge Condition: Stable Ambulatory Status: [name] Discharge Destination: Home Transportation: Private Auto Accompanied By: self Schedule Follow-up  Appointment: Yes Clinical Summary of Care: Patient Declined Electronic Signature(s) Signed: 08/05/2023 4:30:16 PM By: Jordan Deed RN, BSN Entered By: [name] on 08/05/2023 16:00:47 -------------------------------------------------------------------------------- Pain Assessment Details Patient Name: Date of Service: [name], [name] 08/05/2023 3:30 PM Medical Record Number: [id number] Patient Account Number: [id number] Date of Birth/Sex: Treating RN: [birth year] (89 y.o. [name] Primary Care Myrtis Maille: [name] Other Clinician: Referring Clancy Leiner: Treating Teion Ballin/Extender: Duanne [name] [facility], [name] in Treatment: 1 Active Problems Location of Pain Severity and Description of Pain Patient Has Paino Yes Site Locations Pain LocationKENICHI, TREU ([id number]) [phone].pdf Page 3 of 6 Pain Location: Pain in Ulcers With  Dressing Change: Yes Duration of the Pain. Constant / Intermittento Constant Rate the pain. Current Pain Level: 5 Worst Pain Level: 7 Least Pain Level: 2 Character of Pain Describe the Pain: Aching Pain Management and Medication Current Pain Management: Medication: Yes Is the Current Pain Management Adequate: Adequate How does your wound impact your activities of daily livingo Sleep: No Bathing: No Appetite: No Relationship With Others: No Bladder Continence: No Emotions: No Bowel Continence: No Work: No Toileting: No Drive: No Dressing: No Hobbies: No Notes has small pressure ulcer to right buttock Electronic Signature(s) Signed: 08/05/2023 4:30:16 PM By: Jordan Deed RN, BSN Entered By: [name] on 08/05/2023 16:02:57 -------------------------------------------------------------------------------- Patient/Caregiver Education Details Patient Name: Date of Service: [name], [name] 12/23/2024andnbsp3:30 PM Medical Record Number: [id number] Patient Account  Number: [id number] Date of Birth/Gender: Treating RN: 1932/05/25 (89 y.o. [name] Primary Care Physician: [name] Other Clinician: Referring Physician: Treating Physician/Extender: Duanne [name] [facility], [name] in Treatment: 1 Education Assessment Education Provided To: Patient Education Topics Provided Pressure: Methods: Explain/Verbal Responses: Reinforcements needed, State content correctly Venous: Methods: Explain/Verbal Responses: Reinforcements needed, State content correctly [name], [name] ([id number]) [phone].pdf Page 4 of 6 Electronic Signature(s) Signed: 08/05/2023 4:30:16 PM By: Jordan Deed RN, BSN Entered By: [name] on 08/05/2023 16:00:35 -------------------------------------------------------------------------------- Wound Assessment Details Patient Name: Date of Service: [name], [name] 08/05/2023 3:30 PM Medical Record Number: [id number] Patient Account Number: [id number] Date of Birth/Sex: Treating RN: 08-09-1932 (89 y.o. [name] Primary Care Dayshia Ballinas: [name] Other Clinician: Referring Lucresha Dismuke: Treating Antwain Caliendo/Extender: Duanne [name] [facility], [name] in Treatment: 1 Wound Status Wound Number: 8 Primary Venous Leg Ulcer Etiology: Wound Location: Right, Circumferential Lower Leg Wound Open Wounding Event: Gradually Appeared Status: Date Acquired: 07/04/2023 Comorbid Cataracts, Congestive Heart Failure, Coronary Artery Disease, [name] Of Treatment: 1 History: Hypertension, Peripheral Venous Disease, Osteoarthritis, Clustered Wound: No Neuropathy Wound Measurements Length: (cm) 14 Width: (cm) 32 Depth: (cm) 0.1 Area: (cm) 351.858 Volume: (cm) 35.186 % Reduction in Area: 0% % Reduction in Volume: 0% Epithelialization: Medium (34-66%) Tunneling: No Undermining: No Wound Description Classification: Full Thickness Without Exposed Support  Structures Wound Margin: Flat and Intact Exudate Amount: Medium Exudate Type: Serosanguineous Exudate Color: red, brown Foul Odor After Cleansing: No Slough/Fibrino Yes Wound Bed Granulation Amount: Large (67-100%) Exposed Structure Granulation Quality: Red Fascia Exposed: No Necrotic Amount: Small (1-33%) Fat Layer (Subcutaneous Tissue) Exposed: Yes Necrotic Quality: Adherent Slough Tendon Exposed: No Muscle Exposed: No Joint Exposed: No Bone Exposed: No Periwound Skin Texture Texture Color No Abnormalities Noted: Yes No Abnormalities Noted: No Hemosiderin Staining: Yes Moisture No Abnormalities Noted: Yes Temperature / Pain Temperature: No Abnormality Tenderness on Palpation: Yes Treatment Notes Wound #8 (Lower Leg) Wound Laterality: Right, Circumferential Cleanser Soap and Water Discharge Instruction: May shower and wash wound with dial antibacterial soap and water prior to dressing change. Peri-Wound Care Zinc Oxide Ointment 30g tube Discharge Instruction: Apply Zinc Oxide to periwound with each dressing change Topical [name], [name] ([id number]) [phone].pdf Page 5 of 6 Primary Dressing Maxorb Extra Ag+ Alginate Dressing, 4x4.75 (in/in) Discharge Instruction: Apply to wound bed as instructed Secondary Dressing ABD Pad, 5x9 Discharge Instruction: Apply over primary dressing as directed. Secured With Compression Wrap Urgo K2 Lite, (equivalent to a 3 layer) two layer compression system, regular Discharge Instruction: Apply Urgo K2 Lite as directed (alternative to 3 layer compression). Compression Stockings Add-Ons Electronic Signature(s) Signed: 08/05/2023 4:30:16 PM By: Jordan Deed RN, BSN Entered By: [name] on  08/05/2023 15:59:12 -------------------------------------------------------------------------------- Wound Assessment Details Patient Name: Date of Service: [name] 08/05/2023 3:30 PM Medical Record  Number: [id number] Patient Account Number: [id number] Date of Birth/Sex: Treating RN: 12-16-1931 (89 y.o. [name] Primary Care Kyrstyn Greear: [name] Other Clinician: Referring Ashon Rosenberg: Treating [place] Duerst/Extender: Duanne [name] [name], [name] in Treatment: 1 Wound Status Wound Number: 9 Primary Pressure Ulcer Etiology: Wound Location: Right Gluteus Wound Open Wounding Event: Pressure Injury Status: Date Acquired: 07/29/2023 Comorbid Cataracts, Congestive Heart Failure, Coronary Artery Disease, [name] Of Treatment: 0 History: Hypertension, Peripheral Venous Disease, Osteoarthritis, Clustered Wound: No Neuropathy Wound Measurements Length: (cm) 1 Width: (cm) 1 Depth: (cm) 0.1 Area: (cm) 0.785 Volume: (cm) 0.079 % Reduction in Area: % Reduction in Volume: Epithelialization: None Tunneling: No Undermining: No Wound Description Classification: Category/Stage III Wound Margin: Flat and Intact Exudate Amount: Small Exudate Type: Serosanguineous Exudate Color: red, brown Foul Odor After Cleansing: No Slough/Fibrino Yes Wound Bed Granulation Amount: Small (1-33%) Exposed Structure Granulation Quality: Red Fascia Exposed: No Necrotic Amount: Large (67-100%) Fat Layer (Subcutaneous Tissue) Exposed: Yes Necrotic Quality: Adherent Slough Tendon Exposed: No Muscle Exposed: No Joint Exposed: No Bone Exposed: No Periwound Skin Texture Texture Color No Abnormalities Noted: Yes No Abnormalities NotedSHION, [name] ([id number]) [id number] Page 6 of 6 Moisture Temperature / Pain No Abnormalities Noted: Yes Temperature: No Abnormality Tenderness on Palpation: Yes Treatment Notes Wound #9 (Gluteus) Wound Laterality: Right Cleanser Peri-Wound Care Topical Primary Dressing Secondary Dressing Zetuvit Plus Silicone Border Dressing 3x3 (in/in) Discharge Instruction: Apply silicone border over primary dressing as  directed. Secured With Compression Wrap Compression Stockings Facilities manager) Signed: 08/05/2023 4:30:16 PM By: Jordan Deed RN, BSN Entered By: [name] on 08/05/2023 16:04:33

## 2023-08-11 ENCOUNTER — Other Ambulatory Visit: Payer: Self-pay | Admitting: Internal Medicine

## 2023-08-11 DIAGNOSIS — I5032 Chronic diastolic (congestive) heart failure: Secondary | ICD-10-CM

## 2023-08-11 DIAGNOSIS — R600 Localized edema: Secondary | ICD-10-CM

## 2023-08-11 DIAGNOSIS — I10 Essential (primary) hypertension: Secondary | ICD-10-CM

## 2023-08-13 ENCOUNTER — Encounter (HOSPITAL_BASED_OUTPATIENT_CLINIC_OR_DEPARTMENT_OTHER): Payer: Medicare Other | Admitting: General Surgery

## 2023-08-13 ENCOUNTER — Other Ambulatory Visit (HOSPITAL_BASED_OUTPATIENT_CLINIC_OR_DEPARTMENT_OTHER): Payer: Self-pay | Admitting: General Surgery

## 2023-08-13 DIAGNOSIS — I87333 Chronic venous hypertension (idiopathic) with ulcer and inflammation of bilateral lower extremity: Secondary | ICD-10-CM | POA: Diagnosis not present

## 2023-08-14 NOTE — Progress Notes (Signed)
 [name], [name] ([id number]) [id number] Page 1 of 11 Visit Report for 08/13/2023 Biopsy Details Patient Name: Date of Service: [place] 08/13/2023 2:30 PM Medical Record Number: [id number] Patient Account Number: [id number] Date of Birth/Sex: Treating RN: 1932-01-23 (89 y.o. M) Primary Care Provider: ILEEN BROWNING Other Clinician: Referring Provider: Treating Provider/Extender: Marolyn Nest [facility], MICHELLE Weeks in Treatment: 2 Biopsy Performed for: The following information was scribed by: Drury Nestle The information was scribed for: Marolyn Nest Location(s): Other: Left forearm Performed By: Physician Marolyn Nest, MD Tissue Punch: No Number of Specimens T aken: 2 Specimen Sent T Pathology: o Yes Level of Consciousness (Pre-procedure): Awake and Alert Pre-procedure Verification/Time-Out Taken: Yes - 15:15 Pain Control: Lidocaine  Injectable Lidocaine  Percent: 1% Instrument: Blade, Forceps Hemostasis Achieved: Pressure Procedural Pain: 0 Post Procedural Pain: 0 Response to Treatment: Procedure was tolerated well Level of Consciousness (Post-procedure): Awake and Alert Post Procedure Diagnosis Same as Pre-procedure Electronic Signature(s) Signed: 08/13/2023 4:26:08 PM By: Marolyn Nest MD FACS Entered By: Marolyn Nest on 08/13/2023 15:38:27 -------------------------------------------------------------------------------- Chief Complaint Document Details Patient Name: Date of Service: [name], Jordan E. 08/13/2023 2:30 PM Medical Record Number: [id number] Patient Account Number: [id number] Date of Birth/Sex: Treating RN: 16-Jul-1932 (89 y.o. M) Primary Care Provider: ILEEN BROWNING Other Clinician: Referring Provider: Treating Provider/Extender: Marolyn Nest [facility], MICHELLE Weeks in Treatment: 2 Information Obtained from: Patient Chief Complaint Patient presents for treatment of an open ulcer  due to venous insufficiency 07/24/2023; patient arrives in clinic with circumferential blistering and skin breakdown in the right lower leg Electronic Signature(s) Signed: 08/13/2023 4:26:08 PM By: Marolyn Nest MD FACS Entered By: Marolyn Nest on 08/13/2023 15:38:38 [name] ([id number]) [id number] Page 2 of 11 -------------------------------------------------------------------------------- Debridement Details Patient Name: Date of Service: [place] 08/13/2023 2:30 PM Medical Record Number: [id number] Patient Account Number: [id number] Date of Birth/Sex: Treating RN: Dec 23, 1931 (89 y.o. NETTY Drury Nestle Primary Care Provider: ILEEN BROWNING Other Clinician: Referring Provider: Treating Provider/Extender: Marolyn Nest [facility], MICHELLE Weeks in Treatment: 2 Debridement Performed for Assessment: Wound #11 Left Knee Performed By: Physician Marolyn Nest, MD The following information was scribed by: Drury Nestle The information was scribed for: Marolyn Nest Debridement Type: Debridement Level of Consciousness (Pre-procedure): Awake and Alert Pre-procedure Verification/Time Out Yes - 15:10 Taken: Start Time: 15:11 Pain Control: Lidocaine  4% T opical Solution Percent of Wound Bed Debrided: 100% T Area Debrided (cm): otal 0.94 Tissue and other material debrided: Viable, Non-Viable, Slough, Subcutaneous, Slough Level: Skin/Subcutaneous Tissue Debridement Description: Excisional Instrument: Curette Bleeding: Minimum Hemostasis Achieved: Pressure End Time: 15:18 Procedural Pain: 0 Post Procedural Pain: 0 Response to Treatment: Procedure was tolerated well Level of Consciousness (Post- Awake and Alert procedure): Post Debridement Measurements of Total Wound Length: (cm) 1 Width: (cm) 1.2 Depth: (cm) 0.1 Volume: (cm) 0.094 Character of Wound/Ulcer Post Debridement: Improved Post Procedure Diagnosis Same as  Pre-procedure Electronic Signature(s) Signed: 08/13/2023 4:26:08 PM By: Marolyn Nest MD FACS Signed: 08/13/2023 4:41:36 PM By: Drury Nestle RN, BSN Entered By: Drury Nestle on 08/13/2023 15:25:26 -------------------------------------------------------------------------------- Debridement Details Patient Name: Date of Service: [name], Jordan E. 08/13/2023 2:30 PM Medical Record Number: [id number] Patient Account Number: [id number] Date of Birth/Sex: Treating RN: [birth year] (89 y.o. NETTY Drury Nestle Primary Care Provider: ILEEN BROWNING Other Clinician: Referring Provider: Treating Provider/Extender: Marolyn Nest [facility], MICHELLE Weeks in Treatment: 2 Debridement Performed for Assessment: Wound #9 Right Gluteus Performed By: Physician Marolyn Nest, MD The following information was scribed by: Drury Nestle The information  was scribed for: Sion, Reinders ([id number]) [id number] Page 3 of 11 Debridement Type: Debridement Level of Consciousness (Pre-procedure): Awake and Alert Pre-procedure Verification/Time Out Yes - 15:10 Taken: Start Time: 15:11 Pain Control: Lidocaine  4% T opical Solution Percent of Wound Bed Debrided: 100% T Area Debrided (cm): otal 0.49 Tissue and other material debrided: Viable, Non-Viable, Slough, Subcutaneous, Slough Level: Skin/Subcutaneous Tissue Debridement Description: Excisional Instrument: Curette Bleeding: Minimum Hemostasis Achieved: Pressure End Time: 15:18 Procedural Pain: 0 Post Procedural Pain: 0 Response to Treatment: Procedure was tolerated well Level of Consciousness (Post- Awake and Alert procedure): Post Debridement Measurements of Total Wound Length: (cm) 0.7 Stage: Category/Stage III Width: (cm) 0.9 Depth: (cm) 0.1 Volume: (cm) 0.049 Character of Wound/Ulcer Post Debridement: Improved Post Procedure Diagnosis Same as Pre-procedure Electronic  Signature(s) Signed: 08/13/2023 4:26:08 PM By: Marolyn Delon MD FACS Signed: 08/13/2023 4:41:36 PM By: Drury Nestle RN, BSN Entered By: Drury Nestle on 08/13/2023 15:26:02 -------------------------------------------------------------------------------- HPI Details Patient Name: Date of Service: [name], Jordan E. 08/13/2023 2:30 PM Medical Record Number: [id number] Patient Account Number: [id number] Date of Birth/Sex: Treating RN: 13-Aug-1932 (89 y.o. M) Primary Care Provider: ILEEN BROWNING Other Clinician: Referring Provider: Treating Provider/Extender: Marolyn Delon [facility], MICHELLE Weeks in Treatment: 2 History of Present Illness HPI Description: ADMISSION 01/11/2023 This is an 89 year old nondiabetic with a history of chronic venous insufficiency, coronary artery disease, congestive heart failure, and lower extremity edema. He has historically been followed by podiatry for various ulcers and calluses on his feet. When he was seen in the middle of April, he was noted to have an ulcer starting on his left ankle. He was prescribed a course of cephalexin  and advised to apply Betadine  to the wound with an Ace wrap. He was referred to the wound care center for further evaluation and management. Apparently the wound closed of its own accord prior to his appointment with us , but subsequently reopened. The notes from podiatry do indicate that compression and elevation were discussed as means to help heal ulceration and avoid future episodes. After the wound reopened, he was seen again and prescribed a course of Augmentin . He is now here for his wound care appointment. 01/21/2023: The wound is smaller and more superficial. It is fairly clean with just a little bit of light slough on the surface. His venous reflux studies are scheduled for this coming Thursday. 01/29/2023: The wound is smaller again today and very superficial. There is minimal slough present with a little eschar around  the edges. His venous reflux studies were completed, and as expected, he has severe venous reflux. 02/07/2023: His wound is very nearly closed. He has an appointment coming up with vascular surgery on July 8. He indicated a callus on the bottom of his left foot that is bothering him today. 02/15/2023: There is just a pinhole opening remaining on the left, but unfortunately, he has had a new ulceration occur on the medial aspect of his right lower leg. The fat layer is exposed. He does have 2+ pitting edema in this leg. He says he noted fluid coming from the site a couple of days ago. He does not wear compression garments due to discomfort with stockings, but he has never received juxta lite or Farrow wrap garments. 02/22/2023: His left leg has healed. On the right, the open area has contracted considerably. There is some slough and eschar present. He did meet with AUGUSTIN, BUN ([id number]) [phone].pdf Page 4 of 11 vascular surgery for his initial consultation and  will return in 3 months to discuss saphenous vein ablation. 03/04/2023: His wraps slid down bilaterally and he is leaking clear fluid from the left leg. There is slough build up on the right lower leg wound, but the overall size is smaller. 03/11/2023: The area on his left leg where he was leaking fluid has closed. The right lower leg wound is smaller and clean. He does have his Farrow wraps with him today. 03/27/2023: At his nurse visit last week, his left medial ankle was found to be open. He has another new wound today on the left anterior tibial surface. The right ankle ulcer is smaller today. Everything is very superficial and clean. 04/04/2023: Despite being in bilateral 3 layer compression equivalent, he has developed new wounds. All of these seem to be secondary to blistering. He has a new wound on his right lateral lower leg just above the ankle and 2 new sites on his left anterolateral lower leg. These are  fairly tender and a the skin is little bit erythematous around them. 04/18/2023: His wounds are healed. READMISSION 07/24/2023 This is an 89 year old man we had in clinic from 01/11/2023 through 04/18/2023 with bilateral lower leg wounds felt to be secondary to bilateral chronic venous insufficiency. Eventually he healed up. He was discharged with Aundra wraps and he had a vein and vascular follow-up. Indeed he did see Dr. Serene on 06/03/2023 and it was felt that he would be a candidate for right saphenous vein ablations. Shortly thereafter the patient reports that he developed rapid increase in edema in his legs right greater than left. More recently there was blistering and open wounds in the right posterior and anterior ankle area. He now has open wounds in this area. In discussion with him it is quite clear he was not wearing the Farrow wraps. I am not certain if it was an issue with him not being able to get them on himself or simply disinterested. He says at 1 point my legs were not swollen so I did not put them on. 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 The patient has a history of pulmonary embolism, spinal stenosis and chronic venous insufficiency. I do not think he is felt to have a significant arterial issue. 08/13/2023: He has a new stage III pressure ulcer on his right buttock. There is slough on the surface. He admits to sitting stationary for prolonged periods of time without shifting his weight. He has a traumatic new wound to his left knee with slough and eschar buildup on the surface. The circumferential right leg wound has healed, but he has a new blister on his right lateral lower leg that has opened. The surface is very clean. He also pointed out a lesion on his left forearm that he says has been bleeding off and on. It is fleshy and somewhat fungating in appearance. Electronic Signature(s) Signed: 08/13/2023 4:26:08 PM By: Marolyn Nest MD  FACS Entered By: Marolyn Nest on 08/13/2023 15:40:09 -------------------------------------------------------------------------------- Physical Exam Details Patient Name: Date of Service: [name], Jordan E. 08/13/2023 2:30 PM Medical Record Number: [id number] Patient Account Number: [id number] Date of Birth/Sex: Treating RN: [birth year] (89 y.o. M) Primary Care Provider: ILEEN BROWNING Other Clinician: Referring Provider: Treating Provider/Extender: Marolyn Nest [facility], MICHELLE Weeks in Treatment: 2 Constitutional . . . . no acute distress. Respiratory Normal work of breathing on room air.. Notes 08/13/2023: He has a new stage III pressure ulcer on his right buttock. There  is slough on the surface. He has a traumatic new wound to his left knee with slough and eschar buildup on the surface. The circumferential right leg wound has healed, but he has a new blister on his right lateral lower leg that has opened. The surface is very clean. He also pointed out a lesion on his left forearm that he says has been bleeding off and on. It is fleshy and somewhat fungating in appearance. Electronic Signature(s) Signed: 08/13/2023 4:26:08 PM By: Marolyn Nest MD FACS Entered By: Marolyn Nest on 08/13/2023 16:05:55 [name] ([id number]) [id number] Page 5 of 11 -------------------------------------------------------------------------------- Physician Orders Details Patient Name: Date of Service: [place] 08/13/2023 2:30 PM Medical Record Number: [id number] Patient Account Number: [id number] Date of Birth/Sex: Treating RN: Nov 13, 1931 (89 y.o. NETTY Drury Nestle Primary Care Provider: ILEEN BROWNING Other Clinician: Referring Provider: Treating Provider/Extender: Marolyn Nest [facility], MICHELLE Weeks in Treatment: 2 The following information was scribed by: Drury Nestle The information was scribed for: Marolyn Nest Verbal  / Phone Orders: No Diagnosis Coding ICD-10 Coding Code Description I87.333 Chronic venous hypertension (idiopathic) with ulcer and inflammation of bilateral lower extremity I89.0 Lymphedema, not elsewhere classified L97.818 Non-pressure chronic ulcer of other part of right lower leg with other specified severity Follow-up Appointments ppointment in 1 week. - Dr. Marolyn (front office to schedule) Return A ppointment in 2 weeks. - Dr. Marolyn (front office to schedule) Return A Return appointment in 3 weeks. - Dr. Marolyn (front office to schedule) Other: - Will call you with biopsy results once it returns. Anesthetic (In clinic) Topical Lidocaine  4% applied to wound bed (In clinic) Xylocaine  1% injection applied to wound bed - to left forearm. Bathing/ Shower/ Hygiene May shower with protection but do not get wound dressing(s) wet. Protect dressing(s) with water  repellant cover (for example, large plastic bag) or a cast cover and may then take shower. Edema Control - Orders / Instructions Elevate legs to the level of the heart or above for 30 minutes daily and/or when sitting for 3-4 times a day throughout the day. Avoid standing for long periods of time. Other Edema Control Orders/Instructions: - left leg apply urgo k2 lite and lotion. Lymphedema Treatment Plan - Exercise, Compression and Elevation Elevate legs 30 - 60 minutes at or above heart level at least 3 - 4 times daily as able/tolerated Avoid standing for long periods and elevate leg(s) parallel to the floor when sitting Wound Treatment Wound #10 - Lower Leg Wound Laterality: Right, Lateral Cleanser: Soap and Water  1 x Per Week/30 Days Discharge Instructions: May shower and wash wound with dial antibacterial soap and water  prior to dressing change. Cleanser: Vashe 5.8 (oz) 1 x Per Week/30 Days Discharge Instructions: Cleanse the wound with Vashe prior to applying a clean dressing using gauze sponges, not tissue or cotton  balls. Peri-Wound Care: Sween Lotion (Moisturizing lotion) 1 x Per Week/30 Days Discharge Instructions: Apply moisturizing lotion as directed Prim Dressing: Maxorb Extra Ag+ Alginate Dressing, 2x2 (in/in) 1 x Per Week/30 Days ary Discharge Instructions: Apply to wound bed as instructed Secondary Dressing: Woven Gauze Sponge, Non-Sterile 4x4 in 1 x Per Week/30 Days Discharge Instructions: Apply over primary dressing as directed. Compression Wrap: Urgo K2 Lite, (equivalent to a 3 layer) two layer compression system, regular 1 x Per Week/30 Days Discharge Instructions: Apply Urgo K2 Lite as directed (alternative to 3 layer compression). Wound #11 - Knee Wound Laterality: Left Cleanser: Soap and Water  Every Other Day/30 Days Discharge Instructions:  May shower and wash wound with dial antibacterial soap and water  prior to dressing change. Cleanser: Vashe 5.8 (oz) Every Other Day/30 Days Discharge Instructions: Cleanse the wound with Vashe prior to applying a clean dressing using gauze sponges, not tissue or cotton balls. [name], [name] ([id number]) [id number] Page 6 of 11 Peri-Wound Care: Skin Prep (DME) (Generic) Every Other Day/30 Days Discharge Instructions: Use skin prep as directed Prim Dressing: Maxorb Extra Ag+ Alginate Dressing, 2x2 (in/in) (DME) (Generic) Every Other Day/30 Days ary Discharge Instructions: Apply to wound bed as instructed Secondary Dressing: Zetuvit Plus Silicone Border Dressing 3x3 (in/in) (DME) (Generic) Every Other Day/30 Days Discharge Instructions: Apply silicone border over primary dressing as directed. Wound #9 - Gluteus Wound Laterality: Right Cleanser: Soap and Water  Every Other Day/30 Days Discharge Instructions: May shower and wash wound with dial antibacterial soap and water  prior to dressing change. Cleanser: Vashe 5.8 (oz) Every Other Day/30 Days Discharge Instructions: Cleanse the wound with Vashe prior to applying a clean  dressing using gauze sponges, not tissue or cotton balls. Peri-Wound Care: Skin Prep (DME) (Generic) Every Other Day/30 Days Discharge Instructions: Use skin prep as directed Prim Dressing: Maxorb Extra Ag+ Alginate Dressing, 2x2 (in/in) (DME) (Generic) Every Other Day/30 Days ary Discharge Instructions: Apply to wound bed as instructed Secondary Dressing: Zetuvit Plus Silicone Border Dressing 3x3 (in/in) (DME) (Generic) Every Other Day/30 Days Discharge Instructions: Apply silicone border over primary dressing as directed. Laboratory Bacteria identified in Tissue by Biopsy culture (MICRO) - Biopsy of left forearm area. LOINC Code: [phone] Convenience Name: Biopsy specimen culture Electronic Signature(s) Signed: 08/13/2023 4:26:08 PM By: Marolyn Nest MD FACS Entered By: Marolyn Nest on 08/13/2023 16:09:47 -------------------------------------------------------------------------------- Problem List Details Patient Name: Date of Service: [name], Jamiel E. 08/13/2023 2:30 PM Medical Record Number: [id number] Patient Account Number: [id number] Date of Birth/Sex: Treating RN: 1932/01/12 (89 y.o. NETTY Drury Nestle Primary Care Provider: ILEEN BROWNING Other Clinician: Referring Provider: Treating Provider/Extender: Marolyn Nest [facility], MICHELLE Weeks in Treatment: 2 Active Problems ICD-10 Encounter Code Description Active Date MDM Diagnosis L97.818 Non-pressure chronic ulcer of other part of right lower leg with other specified 07/24/2023 No Yes severity L89.313 Pressure ulcer of right buttock, stage 3 08/13/2023 No Yes L98.8 Other specified disorders of the skin and subcutaneous tissue 08/13/2023 No Yes I87.333 Chronic venous hypertension (idiopathic) with ulcer and inflammation of 07/24/2023 No Yes bilateral lower extremity ROE, WILNER E ([id number]) [id number] Page 7 of 11 I89.0 Lymphedema, not elsewhere classified 07/24/2023 No  Yes Inactive Problems Resolved Problems Electronic Signature(s) Signed: 08/13/2023 4:26:08 PM By: Marolyn Nest MD FACS Entered By: Marolyn Nest on 08/13/2023 15:36:10 -------------------------------------------------------------------------------- Progress Note Details Patient Name: Date of Service: [name], Kimm E. 08/13/2023 2:30 PM Medical Record Number: [id number] Patient Account Number: [id number] Date of Birth/Sex: Treating RN: 1931-11-12 (89 y.o. M) Primary Care Provider: ILEEN BROWNING Other Clinician: Referring Provider: Treating Provider/Extender: Marolyn Nest [facility], MICHELLE Weeks in Treatment: 2 Subjective Chief Complaint Information obtained from Patient Patient presents for treatment of an open ulcer due to venous insufficiency 07/24/2023; patient arrives in clinic with circumferential blistering and skin breakdown in the right lower leg History of Present Illness (HPI) ADMISSION 01/11/2023 This is an 89 year old nondiabetic with a history of chronic venous insufficiency, coronary artery disease, congestive heart failure, and lower extremity edema. He has historically been followed by podiatry for various ulcers and calluses on his feet. When he was seen in the middle of April, he was noted to have an ulcer starting  on his left ankle. He was prescribed a course of cephalexin  and advised to apply Betadine  to the wound with an Ace wrap. He was referred to the wound care center for further evaluation and management. Apparently the wound closed of its own accord prior to his appointment with us , but subsequently reopened. The notes from podiatry do indicate that compression and elevation were discussed as means to help heal ulceration and avoid future episodes. After the wound reopened, he was seen again and prescribed a course of Augmentin . He is now here for his wound care appointment. 01/21/2023: The wound is smaller and more superficial. It is fairly  clean with just a little bit of light slough on the surface. His venous reflux studies are scheduled for this coming Thursday. 01/29/2023: The wound is smaller again today and very superficial. There is minimal slough present with a little eschar around the edges. His venous reflux studies were completed, and as expected, he has severe venous reflux. 02/07/2023: His wound is very nearly closed. He has an appointment coming up with vascular surgery on July 8. He indicated a callus on the bottom of his left foot that is bothering him today. 02/15/2023: There is just a pinhole opening remaining on the left, but unfortunately, he has had a new ulceration occur on the medial aspect of his right lower leg. The fat layer is exposed. He does have 2+ pitting edema in this leg. He says he noted fluid coming from the site a couple of days ago. He does not wear compression garments due to discomfort with stockings, but he has never received juxta lite or Farrow wrap garments. 02/22/2023: His left leg has healed. On the right, the open area has contracted considerably. There is some slough and eschar present. He did meet with vascular surgery for his initial consultation and will return in 3 months to discuss saphenous vein ablation. 03/04/2023: His wraps slid down bilaterally and he is leaking clear fluid from the left leg. There is slough build up on the right lower leg wound, but the overall size is smaller. 03/11/2023: The area on his left leg where he was leaking fluid has closed. The right lower leg wound is smaller and clean. He does have his Farrow wraps with him today. 03/27/2023: At his nurse visit last week, his left medial ankle was found to be open. He has another new wound today on the left anterior tibial surface. The right ankle ulcer is smaller today. Everything is very superficial and clean. 04/04/2023: Despite being in bilateral 3 layer compression equivalent, he has developed new wounds. All of these  seem to be secondary to blistering. He has a new wound on his right lateral lower leg just above the ankle and 2 new sites on his left anterolateral lower leg. These are fairly tender and a the skin is little bit erythematous around them. 04/18/2023: His wounds are healed. [name], [name] ([id number]) [id number] Page 8 of 11 07/24/2023 This is an 89 year old man we had in clinic from 01/11/2023 through 04/18/2023 with bilateral lower leg wounds felt to be secondary to bilateral chronic venous insufficiency. Eventually he healed up. He was discharged with Aundra wraps and he had a vein and vascular follow-up. Indeed he did see Dr. Serene on 06/03/2023 and it was felt that he would be a candidate for right saphenous vein ablations. Shortly thereafter the patient reports that he developed rapid increase in edema in his legs right greater than left. More recently there  was blistering and open wounds in the right posterior and anterior ankle area. He now has open wounds in this area. In discussion with him it is quite clear he was not wearing the [place] wraps. I am not certain if it was an issue with him not being able to get them on himself or simply disinterested. He says at 1 point my legs were not swollen so I did not put them on. 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 The patient has a history of pulmonary embolism, spinal stenosis and chronic venous insufficiency. I do not think he is felt to have a significant arterial issue. 08/13/2023: He has a new stage III pressure ulcer on his right buttock. There is slough on the surface. He admits to sitting stationary for prolonged periods of time without shifting his weight. He has a traumatic new wound to his left knee with slough and eschar buildup on the surface. The circumferential right leg wound has healed, but he has a new blister on his right lateral lower leg that has  opened. The surface is very clean. He also pointed out a lesion on his left forearm that he says has been bleeding off and on. It is fleshy and somewhat fungating in appearance. Objective Constitutional no acute distress. Vitals Time Taken: 2:51 AM, Temperature: 98.1 F, Pulse: 82 bpm, Respiratory Rate: 18 breaths/min, Blood Pressure: 110/60 mmHg. Respiratory Normal work of breathing on room air.. General Notes: 08/13/2023: He has a new stage III pressure ulcer on his right buttock. There is slough on the surface. He has a traumatic new wound to his left knee with slough and eschar buildup on the surface. The circumferential right leg wound has healed, but he has a new blister on his right lateral lower leg that has opened. The surface is very clean. He also pointed out a lesion on his left forearm that he says has been bleeding off and on. It is fleshy and somewhat fungating in appearance. Integumentary (Hair, Skin) Wound #10 status is Open. Original cause of wound was Gradually Appeared. The date acquired was: 08/13/2023. The wound is located on the Right,Lateral Lower Leg. The wound measures 0.7cm length x 0.7cm width x 0.1cm depth; 0.385cm^2 area and 0.038cm^3 volume. There is Fat Layer (Subcutaneous Tissue) exposed. There is no tunneling or undermining noted. There is a medium amount of serosanguineous drainage noted. The wound margin is distinct with the outline attached to the wound base. There is medium (34-66%) red, pink granulation within the wound bed. There is a medium (34-66%) amount of necrotic tissue within the wound bed including Adherent Slough. The periwound skin appearance exhibited: Hemosiderin Staining. The periwound skin appearance did not exhibit: Callus, Crepitus, Excoriation, Induration, Rash, Scarring, Dry/Scaly, Maceration, Atrophie Blanche, Cyanosis, Ecchymosis, Mottled, Pallor, Rubor, Erythema. Periwound temperature was noted as No Abnormality. Wound #11 status is  Open. Original cause of wound was Trauma. The date acquired was: 08/11/2023. The wound is located on the Left Knee. The wound measures 1cm length x 1.2cm width x 0.1cm depth; 0.942cm^2 area and 0.094cm^3 volume. There is no tunneling or undermining noted. There is a medium amount of serosanguineous drainage noted. The wound margin is distinct with the outline attached to the wound base. There is no granulation within the wound bed. There is a large (67-100%) amount of necrotic tissue within the wound bed including Eschar and Adherent Slough. The periwound skin appearance did not exhibit: Callus, Crepitus, Excoriation, Induration, Rash,  Scarring, Dry/Scaly, Maceration, Atrophie Blanche, Cyanosis, Ecchymosis, Hemosiderin Staining, Mottled, Pallor, Rubor, Erythema. Wound #8 status is Healed - Epithelialized. Original cause of wound was Gradually Appeared. The date acquired was: 07/04/2023. The wound has been in treatment 2 weeks. The wound is located on the Right,Circumferential Lower Leg. The wound measures 0cm length x 0cm width x 0cm depth; 0cm^2 area and 0cm^3 volume. There is Fat Layer (Subcutaneous Tissue) exposed. There is no tunneling or undermining noted. There is a none present amount of drainage noted. The wound margin is flat and intact. There is no granulation within the wound bed. There is no necrotic tissue within the wound bed. The periwound skin appearance had no abnormalities noted for texture. The periwound skin appearance had no abnormalities noted for moisture. The periwound skin appearance exhibited: Hemosiderin Staining. Periwound temperature was noted as No Abnormality. The periwound has tenderness on palpation. Wound #9 status is Open. Original cause of wound was Pressure Injury. The date acquired was: 07/29/2023. The wound has been in treatment 1 weeks. The wound is located on the Right Gluteus. The wound measures 0.7cm length x 0.9cm width x 0.1cm depth; 0.495cm^2 area and  0.049cm^3 volume. There is Fat Layer (Subcutaneous Tissue) exposed. There is no tunneling or undermining noted. There is a small amount of serosanguineous drainage noted. The wound margin is flat and intact. There is small (1-33%) red granulation within the wound bed. There is a large (67-100%) amount of necrotic tissue within the wound bed including Adherent Slough. The periwound skin appearance had no abnormalities noted for texture. The periwound skin appearance had no abnormalities noted for moisture. The periwound skin appearance had no abnormalities noted for color. Periwound temperature was noted as No Abnormality. The periwound has tenderness on palpation. Assessment Active Problems ICD-10 Non-pressure chronic ulcer of other part of right lower leg with other specified severity Pressure ulcer of right buttock, stage 3 Other specified disorders of the skin and subcutaneous tissue Chronic venous hypertension (idiopathic) with ulcer and inflammation of bilateral lower extremity Lymphedema, not elsewhere classified [name], [name] ([id number]) [id number] Page 9 of 11 Procedures Wound #11 Pre-procedure diagnosis of Wound #11 is a Trauma, Other located on the Left Knee . There was a Excisional Skin/Subcutaneous Tissue Debridement with a total area of 0.94 sq cm performed by Marolyn Nest, MD. With the following instrument(s): Curette to remove Viable and Non-Viable tissue/material. Material removed includes Subcutaneous Tissue and Slough and after achieving pain control using Lidocaine  4% T opical Solution. A time out was conducted at 15:10, prior to the start of the procedure. A Minimum amount of bleeding was controlled with Pressure. The procedure was tolerated well with a pain level of 0 throughout and a pain level of 0 following the procedure. Post Debridement Measurements: 1cm length x 1.2cm width x 0.1cm depth; 0.094cm^3 volume. Character of Wound/Ulcer  Post Debridement is improved. Post procedure Diagnosis Wound #11: Same as Pre-Procedure Wound #9 Pre-procedure diagnosis of Wound #9 is a Pressure Ulcer located on the Right Gluteus . There was a Excisional Skin/Subcutaneous Tissue Debridement with a total area of 0.49 sq cm performed by Marolyn Nest, MD. With the following instrument(s): Curette to remove Viable and Non-Viable tissue/material. Material removed includes Subcutaneous Tissue and Slough and after achieving pain control using Lidocaine  4% T opical Solution. A time out was conducted at 15:10, prior to the start of the procedure. A Minimum amount of bleeding was controlled with Pressure. The procedure was tolerated well with a pain level of 0  throughout and a pain level of 0 following the procedure. Post Debridement Measurements: 0.7cm length x 0.9cm width x 0.1cm depth; 0.049cm^3 volume. Post debridement Stage noted as Category/Stage III. Character of Wound/Ulcer Post Debridement is improved. Post procedure Diagnosis Wound #9: Same as Pre-Procedure Wound #10 Pre-procedure diagnosis of Wound #10 is a Lymphedema located on the Right,Lateral Lower Leg . There was a Double Layer Compression Therapy Procedure by Drury Nestle, RN. Post procedure Diagnosis Wound #10: Same as Pre-Procedure There was a biopsy performed by Marolyn Nest, MD. The skin was cleansed and prepped with anti-septic followed by pain control using Lidocaine  Injectable: 1%. Tissue was removed at its base with the following instrument(s): Blade and Forceps and sent to pathology. A time out was conducted prior to the start of the procedure. The procedure was tolerated well with a pain level of 0 throughout and a pain level of 0 following the procedure. Patients Level of Consciousness post procedure was recorded as Awake and Alert. Post procedure Diagnosis Wound #: Same as Pre-Procedure There was a Double Layer Compression Therapy Procedure by Drury Nestle,  RN. Post procedure Diagnosis Wound #: Same as Pre-Procedure Plan Follow-up Appointments: Return Appointment in 1 week. - Dr. Marolyn (front office to schedule) Return Appointment in 2 weeks. - Dr. Marolyn (front office to schedule) Return appointment in 3 weeks. - Dr. Marolyn (front office to schedule) Other: - Will call you with biopsy results once it returns. Anesthetic: (In clinic) Topical Lidocaine  4% applied to wound bed (In clinic) Xylocaine  1% injection applied to wound bed - to left forearm. Bathing/ Shower/ Hygiene: May shower with protection but do not get wound dressing(s) wet. Protect dressing(s) with water  repellant cover (for example, large plastic bag) or a cast cover and may then take shower. Edema Control - Orders / Instructions: Elevate legs to the level of the heart or above for 30 minutes daily and/or when sitting for 3-4 times a day throughout the day. Avoid standing for long periods of time. Other Edema Control Orders/Instructions: - left leg apply urgo k2 lite and lotion. Lymphedema Treatment Plan - Exercise, Compression and Elevation: Elevate legs 30 - 60 minutes at or above heart level at least 3 - 4 times daily as able/tolerated Avoid standing for long periods and elevate leg(s) parallel to the floor when sitting Laboratory ordered were: Biopsy specimen culture - Biopsy of left forearm area. WOUND #10: - Lower Leg Wound Laterality: Right, Lateral Cleanser: Soap and Water  1 x Per Week/30 Days Discharge Instructions: May shower and wash wound with dial antibacterial soap and water  prior to dressing change. Cleanser: Vashe 5.8 (oz) 1 x Per Week/30 Days Discharge Instructions: Cleanse the wound with Vashe prior to applying a clean dressing using gauze sponges, not tissue or cotton balls. Peri-Wound Care: Sween Lotion (Moisturizing lotion) 1 x Per Week/30 Days Discharge Instructions: Apply moisturizing lotion as directed Prim Dressing: Maxorb Extra Ag+ Alginate  Dressing, 2x2 (in/in) 1 x Per Week/30 Days ary Discharge Instructions: Apply to wound bed as instructed Secondary Dressing: Woven Gauze Sponge, Non-Sterile 4x4 in 1 x Per Week/30 Days Discharge Instructions: Apply over primary dressing as directed. Com pression Wrap: Urgo K2 Lite, (equivalent to a 3 layer) two layer compression system, regular 1 x Per Week/30 Days Discharge Instructions: Apply Urgo K2 Lite as directed (alternative to 3 layer compression). WOUND #11: - Knee Wound Laterality: Left Cleanser: Soap and Water  Every Other Day/30 Days Discharge Instructions: May shower and wash wound with dial antibacterial soap and water   prior to dressing change. Cleanser: Vashe 5.8 (oz) Every Other Day/30 Days Discharge Instructions: Cleanse the wound with Vashe prior to applying a clean dressing using gauze sponges, not tissue or cotton balls. [name], [name] ([id number]) [id number] Page 10 of 11 Peri-Wound Care: Skin Prep (DME) (Generic) Every Other Day/30 Days Discharge Instructions: Use skin prep as directed Prim Dressing: Maxorb Extra Ag+ Alginate Dressing, 2x2 (in/in) (DME) (Generic) Every Other Day/30 Days ary Discharge Instructions: Apply to wound bed as instructed Secondary Dressing: Zetuvit Plus Silicone Border Dressing 3x3 (in/in) (DME) (Generic) Every Other Day/30 Days Discharge Instructions: Apply silicone border over primary dressing as directed. WOUND #9: - Gluteus Wound Laterality: Right Cleanser: Soap and Water  Every Other Day/30 Days Discharge Instructions: May shower and wash wound with dial antibacterial soap and water  prior to dressing change. Cleanser: Vashe 5.8 (oz) Every Other Day/30 Days Discharge Instructions: Cleanse the wound with Vashe prior to applying a clean dressing using gauze sponges, not tissue or cotton balls. Peri-Wound Care: Skin Prep (DME) (Generic) Every Other Day/30 Days Discharge Instructions: Use skin prep as directed Prim  Dressing: Maxorb Extra Ag+ Alginate Dressing, 2x2 (in/in) (DME) (Generic) Every Other Day/30 Days ary Discharge Instructions: Apply to wound bed as instructed Secondary Dressing: Zetuvit Plus Silicone Border Dressing 3x3 (in/in) (DME) (Generic) Every Other Day/30 Days Discharge Instructions: Apply silicone border over primary dressing as directed. 08/13/2023: He has a new stage III pressure ulcer on his right buttock. There is slough on the surface. He admits to sitting stationary for prolonged periods of time without shifting his weight. He has a traumatic new wound to his left knee with slough and eschar buildup on the surface. The circumferential right leg wound has healed, but he has a new blister on his right lateral lower leg that has opened. The surface is very clean. He also pointed out a lesion on his left forearm that he says has been bleeding off and on. It is fleshy and somewhat fungating in appearance. I used a curette to debride slough and subcutaneous tissue from the new ulcer on his right buttock. Will use silver alginate here. He was educated regarding portance of shifting his weight frequently and standing and walking around on a regular basis to avoid further tissue breakdown. I debrided eschar, slough, and subcutaneous tissue from his left knee. The blister on the right lateral lower leg did not require debridement. We will use silver alginate on both of these locations and applied bilateral Urgo lite compression wraps. The lesion on his forearm is likely a nevus, but certainly could represent a cutaneous malignancy. After obtaining consent, I injected the area with 1% lidocaine  with epinephrine . The lesion was sharply excised down to the skin surface and sent for pathology. We will just cover the site with a Band-Aid. Follow-up in 1 week. Electronic Signature(s) Signed: 08/13/2023 4:26:08 PM By: Marolyn Nest MD FACS Entered By: Marolyn Nest on 08/13/2023  16:11:50 -------------------------------------------------------------------------------- SuperBill Details Patient Name: Date of Service: [name], Jordan E. 08/13/2023 Medical Record Number: [id number] Patient Account Number: [id number] Date of Birth/Sex: Treating RN: 14-May-1932 (89 y.o. M) Primary Care Provider: ILEEN BROWNING Other Clinician: Referring Provider: Treating Provider/Extender: Marolyn Nest [facility], MICHELLE Weeks in Treatment: 2 Diagnosis Coding ICD-10 Codes Code Description [phone] Non-pressure chronic ulcer of other part of right lower leg with other specified severity L89.313 Pressure ulcer of right buttock, stage 3 L98.8 Other specified disorders of the skin and subcutaneous tissue I87.333 Chronic venous hypertension (idiopathic) with ulcer and inflammation  of bilateral lower extremity I89.0 Lymphedema, not elsewhere classified Facility Procedures : CPT4 Code: [id number] Description: 11042 - DEB SUBQ TISSUE 20 SQ CM/< ICD-10 Diagnosis Description L97.818 Non-pressure chronic ulcer of other part of right lower leg with other specified severi L89.313 Pressure ulcer of right buttock, stage 3 Modifier: ty Quantity: 1 : Haq, CPT4 Code: [id number] Redell E ([id number] (F Description: 11102-Tangential biopsy of skin (Rogersg., shave, scoop, saucerize, curette) single lesion ICD-10 Diagnosis Description L98.8 Other specified disorders of the skin and subcutaneous tissue 502) [id number] acility Use Only) [id number] -  APPLY MULTLAY COMPRS LWR LT LEG Modifier: [id number] Quantity: 1 7.pdf Page 11 of 11 1 Physician Procedures : CPT4 Code Description Modifier [id number] - WC PHYS LEVEL 4 - EST PT 25 ICD-10 Diagnosis Description L97.818 Non-pressure chronic ulcer of other part of right lower leg with other specified severity L89.313 Pressure ulcer of right buttock, stage 3  L98.8 Other specified disorders of the skin and subcutaneous tissue I87.333  Chronic venous hypertension (idiopathic) with ulcer and inflammation of bilateral lower extremity Quantity: 1 : [id number] 11042 - WC PHYS SUBQ TISS 20 SQ CM ICD-10 Diagnosis Description L97.818 Non-pressure chronic ulcer of other part of right lower leg with other specified severity L89.313 Pressure ulcer of right buttock, stage 3 Quantity: 1 : 11102 Tangential biopsy of skin (Rogersg., shave, scoop, saucerize, curette) single lesion ICD-10 Diagnosis Description L98.8 Other specified disorders of the skin and subcutaneous tissue Quantity: 1 Electronic Signature(s) Signed: 08/13/2023 4:26:08 PM By: Marolyn Nest MD FACS Signed: 08/13/2023 4:41:36 PM By: Drury Nestle RN, BSN Entered By: Drury Nestle on 08/13/2023 16:21:41

## 2023-08-14 NOTE — Progress Notes (Signed)
 BIENVENIDO, PROEHL ([id number]) [phone].pdf Page 1 of 15 Visit Report for 08/13/2023 Arrival Information Details Patient Name: Date of Service: [name] 08/13/2023 2:30 PM Medical Record Number: [id number] Patient Account Number: [id number] Date of Birth/Sex: Treating RN: 1931-11-19 (89 y.o. M) Primary Care Luis Sami: ILEEN BROWNING Other Clinician: Referring Ola Fawver: Treating Demarques Pilz/Extender: Marolyn Nest [facility], MICHELLE Weeks in Treatment: 2 Visit Information History Since Last Visit Added or deleted any medications: No Patient Arrived: Walker Any new allergies or adverse reactions: No Arrival Time: 14:51 Had a fall or experienced change in No Accompanied By: self activities of daily living that may affect Transfer Assistance: None risk of falls: Patient Identification Verified: Yes Signs or symptoms of abuse/neglect since last visito No Secondary Verification Process Completed: Yes Hospitalized since last visit: No Patient Requires Transmission-Based Precautions: No Implantable device outside of the clinic excluding No Patient Has Alerts: No cellular tissue based products placed in the center since last visit: Pain Present Now: No Electronic Signature(s) Signed: 08/13/2023 3:31:42 PM By: Okey Bonier Entered By: Okey Bonier on 08/13/2023 14:51:25 -------------------------------------------------------------------------------- Compression Therapy Details Patient Name: Date of Service: [name], [name] 08/13/2023 2:30 PM Medical Record Number: [id number] Patient Account Number: [id number] Date of Birth/Sex: Treating RN: 1931/11/18 (89 y.o. NETTY Drury Nestle Primary Care Kambria Grima: ILEEN BROWNING Other Clinician: Referring Kimbly Eanes: Treating Zavior Thomason/Extender: Marolyn Nest [facility], MICHELLE Weeks in Treatment: 2 Compression Therapy Performed for Wound Assessment: Wound #10 Right,Lateral Lower Leg Performed By: Clinician  Drury Nestle, RN Compression Type: Double Layer Post Procedure Diagnosis Same as Pre-procedure Electronic Signature(s) Signed: 08/13/2023 4:41:36 PM By: Drury Nestle RN, BSN Entered By: Drury Nestle on 08/13/2023 15:27:46 Compression Therapy Details -------------------------------------------------------------------------------- [name] ([id number]) [id number] Page 2 of 15 Patient Name: Date of Service: [name] 08/13/2023 2:30 PM Medical Record Number: [id number] Patient Account Number: [id number] Date of Birth/Sex: Treating RN: [birth year] (89 y.o. NETTY Drury Nestle Primary Care Lacrecia Delval: ILEEN BROWNING Other Clinician: Referring Kersten Salmons: Treating Lan Entsminger/Extender: Marolyn Nest [facility], MICHELLE Weeks in Treatment: 2 Compression Therapy Performed for Wound Assessment: NonWound Condition Lymphedema - Left Leg Performed By: Clinician Drury Nestle, RN Compression Type: Double Layer Post Procedure Diagnosis Same as Pre-procedure Electronic Signature(s) Signed: 08/13/2023 4:41:36 PM By: Drury Nestle RN, BSN Entered By: Drury Nestle on 08/13/2023 15:28:33 -------------------------------------------------------------------------------- Encounter Discharge Information Details Patient Name: Date of Service: [name], [name] 08/13/2023 2:30 PM Medical Record Number: [id number] Patient Account Number: [id number] Date of Birth/Sex: Treating RN: 28-Mar-1932 (89 y.o. NETTY Drury Nestle Primary Care Nadene Witherspoon: ILEEN BROWNING Other Clinician: Referring Dawood Spitler: Treating Wilene Pharo/Extender: Marolyn Nest [facility], MICHELLE Weeks in Treatment: 2 Encounter Discharge Information Items Post Procedure Vitals Discharge Condition: Stable Temperature (F): 98.1 Ambulatory Status: Walker Pulse (bpm): 82 Discharge Destination: Home Respiratory Rate (breaths/min): 18 Transportation: Private Auto Blood Pressure (mmHg):  110/60 Accompanied By: self Schedule Follow-up Appointment: Yes Clinical Summary of Care: Electronic Signature(s) Signed: 08/13/2023 4:41:36 PM By: Drury Nestle RN, BSN Entered By: Drury Nestle on 08/13/2023 15:36:50 -------------------------------------------------------------------------------- Lower Extremity Assessment Details Patient Name: Date of Service: [name], [name] 08/13/2023 2:30 PM Medical Record Number: [id number] Patient Account Number: [id number] Date of Birth/Sex: Treating RN: June 22, 1932 (89 y.o. NETTY Drury Nestle Primary Care Carlei Huang: ILEEN BROWNING Other Clinician: Referring Abbye Lao: Treating Archie Atilano/Extender: Marolyn Nest [facility], MICHELLE Weeks in Treatment: 2 Edema Assessment Assessed: [Left: Yes] [Right: Yes] Edema: [Left: No] [Right: Yes] Calf Left: Right: Point of Measurement: From Medial Instep 31 cm 33 cm BLADEN, UMAR [name] ([id number])  [phone].pdf Page 3 of 15 Ankle Left: Right: Point of Measurement: From Medial Instep 22 cm 22 cm Vascular Assessment Pulses: Dorsalis Pedis Palpable: [Left:Yes] [Right:Yes] Extremity colors, hair growth, and conditions: Extremity Color: [Left:Normal] [Right:Normal] Hair Growth on Extremity: [Left:No] [Right:No] Temperature of Extremity: [Left:Warm] [Right:Warm] Capillary Refill: [Left:< 3 seconds] [Right:< 3 seconds] Dependent Rubor: [Left:No] [Right:No] Blanched when Elevated: [Left:No No] [Right:No No] Toe Nail Assessment Left: Right: Thick: Yes Yes Discolored: Yes Yes Deformed: Yes Yes Improper Length and Hygiene: Yes Yes Electronic Signature(s) Signed: 08/13/2023 4:41:36 PM By: Drury Nestle RN, BSN Entered By: Drury Nestle on 08/13/2023 15:06:16 -------------------------------------------------------------------------------- Multi Wound Chart Details Patient Name: Date of Service: [name], [name] 08/13/2023 2:30 PM Medical Record Number: [id number] Patient  Account Number: [id number] Date of Birth/Sex: Treating RN: 1931-10-06 (89 y.o. M) Primary Care Avishai Reihl: ILEEN BROWNING Other Clinician: Referring Toma Arts: Treating Leasha Goldberger/Extender: Marolyn Nest [facility], MICHELLE Weeks in Treatment: 2 Vital Signs Height(in): Pulse(bpm): 82 Weight(lbs): Blood Pressure(mmHg): 110/60 Body Mass Index(BMI): Temperature(F): 98.1 Respiratory Rate(breaths/min): 18 [10:Photos: No Photos] [11:No Photos] Right, Lateral Lower Leg Left Knee Right, Circumferential Lower Leg Wound Location: Gradually Appeared Trauma Gradually Appeared Wounding Event: Lymphedema Trauma, Other Venous Leg Ulcer Primary Etiology: Cataracts, Congestive Heart Failure, Cataracts, Congestive Heart Failure, Cataracts, Congestive Heart Failure, Comorbid History: Coronary Artery Disease, Coronary Artery Disease, Coronary Artery Disease, Hypertension, Peripheral Venous Hypertension, Peripheral Venous Hypertension, Peripheral Venous Disease, Osteoarthritis, Neuropathy Disease, Osteoarthritis, Neuropathy Disease, Osteoarthritis, Neuropathy 08/13/2023 08/11/2023 07/04/2023 Date Acquired: 0 0 2 Weeks of Treatment: Open Open Healed - Epithelialized Wound Status: [name], [name] ([id number]) [id number] Page 4 of 15 No No No Wound Recurrence: 0.7x0.7x0.1 1x1.2x0.1 0x0x0 Measurements L x W x D (cm) 0.385 0.942 0 A (cm) : rea 0.038 0.094 0 Volume (cm) : N/A N/A 100.00% % Reduction in Area: N/A N/A 100.00% % Reduction in Volume: Full Thickness Without Exposed Unclassifiable Full Thickness Without Exposed Classification: Support Structures Support Structures Medium Medium None Present Exudate A mount: Serosanguineous Serosanguineous N/A Exudate Type: red, brown red, brown N/A Exudate Color: Distinct, outline attached Distinct, outline attached Flat and Intact Wound Margin: Medium (34-66%) None Present (0%) None Present (0%) Granulation A  mount: Red, Pink N/A N/A Granulation Quality: Medium (34-66%) Large (67-100%) None Present (0%) Necrotic A mount: Adherent Slough Eschar, Adherent Slough N/A Necrotic Tissue: Fat Layer (Subcutaneous Tissue): Yes Fascia: No Fat Layer (Subcutaneous Tissue): Yes Exposed Structures: Fascia: No Fat Layer (Subcutaneous Tissue): No Fascia: No Tendon: No Tendon: No Tendon: No Muscle: No Muscle: No Muscle: No Joint: No Joint: No Joint: No Bone: No Bone: No Bone: No Small (1-33%) None Large (67-100%) Epithelialization: N/A Debridement - Excisional N/A Debridement: Pre-procedure Verification/Time Out N/A 15:10 N/A Taken: N/A Lidocaine  4% Topical Solution N/A Pain Control: N/A Subcutaneous, Slough N/A Tissue Debrided: N/A Skin/Subcutaneous Tissue N/A Level: N/A 0.94 N/A Debridement A (sq cm): rea N/A Curette N/A Instrument: N/A Minimum N/A Bleeding: N/A Pressure N/A Hemostasis A chieved: N/A 0 N/A Procedural Pain: N/A 0 N/A Post Procedural Pain: N/A Procedure was tolerated well N/A Debridement Treatment Response: N/A 1x1.2x0.1 N/A Post Debridement Measurements L x W x D (cm) N/A 0.094 N/A Post Debridement Volume: (cm) N/A N/A N/A Post Debridement Stage: Excoriation: No Excoriation: No No Abnormalities Noted Periwound Skin Texture: Induration: No Induration: No Callus: No Callus: No Crepitus: No Crepitus: No Rash: No Rash: No Scarring: No Scarring: No Maceration: No Maceration: No No Abnormalities Noted Periwound Skin Moisture: Dry/Scaly: No Dry/Scaly: No Hemosiderin Staining: Yes Atrophie Blanche: No  Hemosiderin Staining: Yes Periwound Skin Color: Atrophie Blanche: No Cyanosis: No Cyanosis: No Ecchymosis: No Ecchymosis: No Erythema: No Erythema: No Hemosiderin Staining: No Mottled: No Mottled: No Pallor: No Pallor: No Rubor: No Rubor: No No Abnormality N/A No Abnormality Temperature: N/A N/A Yes Tenderness on Palpation: Compression  Therapy Debridement N/A Procedures Performed: Wound Number: 9 N/A N/A Photos: N/A N/A Right Gluteus N/A N/A Wound Location: Pressure Injury N/A N/A Wounding Event: Pressure Ulcer N/A N/A Primary Etiology: Cataracts, Congestive Heart Failure, N/A N/A Comorbid History: Coronary Artery Disease, Hypertension, Peripheral Venous Disease, Osteoarthritis, Neuropathy 07/29/2023 N/A N/A Date Acquired: 1 N/A N/A Weeks of Treatment: Open N/A N/A Wound Status: No N/A N/A Wound Recurrence: 0.7x0.9x0.1 N/A N/A Measurements L x W x D (cm) 0.495 N/A N/A A (cm) : rea 0.049 N/A N/A Volume (cm) : 36.90% N/A N/A % Reduction in AreaKAMILO, [name] ([id number]) [id number] Page 5 of 15 38.00% N/A N/A % Reduction in Volume: Category/Stage III N/A N/A Classification: Small N/A N/A Exudate A mount: Serosanguineous N/A N/A Exudate Type: red, brown N/A N/A Exudate Color: Flat and Intact N/A N/A Wound Margin: Small (1-33%) N/A N/A Granulation A mount: Red N/A N/A Granulation Quality: Large (67-100%) N/A N/A Necrotic A mount: Adherent Slough N/A N/A Necrotic Tissue: Fat Layer (Subcutaneous Tissue): Yes N/A N/A Exposed Structures: Fascia: No Tendon: No Muscle: No Joint: No Bone: No None N/A N/A Epithelialization: Debridement - Excisional N/A N/A Debridement: Pre-procedure Verification/Time Out 15:10 N/A N/A Taken: Lidocaine  4% Topical Solution N/A N/A Pain Control: Subcutaneous, Slough N/A N/A Tissue Debrided: Skin/Subcutaneous Tissue N/A N/A Level: 0.49 N/A N/A Debridement A (sq cm): rea Curette N/A N/A Instrument: Minimum N/A N/A Bleeding: Pressure N/A N/A Hemostasis A chieved: 0 N/A N/A Procedural Pain: 0 N/A N/A Post Procedural Pain: Procedure was tolerated well N/A N/A Debridement Treatment Response: 0.7x0.9x0.1 N/A N/A Post Debridement Measurements L x W x D (cm) 0.049 N/A N/A Post Debridement Volume: (cm) Category/Stage  III N/A N/A Post Debridement Stage: No Abnormalities Noted N/A N/A Periwound Skin Texture: No Abnormalities Noted N/A N/A Periwound Skin Moisture: No Abnormalities Noted N/A N/A Periwound Skin Color: No Abnormality N/A N/A Temperature: Yes N/A N/A Tenderness on Palpation: Debridement N/A N/A Procedures Performed: Treatment Notes Wound #10 (Lower Leg) Wound Laterality: Right, Lateral Cleanser Soap and Water  Discharge Instruction: May shower and wash wound with dial antibacterial soap and water  prior to dressing change. Vashe 5.8 (oz) Discharge Instruction: Cleanse the wound with Vashe prior to applying a clean dressing using gauze sponges, not tissue or cotton balls. Peri-Wound Care Sween Lotion (Moisturizing lotion) Discharge Instruction: Apply moisturizing lotion as directed Topical Primary Dressing Maxorb Extra Ag+ Alginate Dressing, 2x2 (in/in) Discharge Instruction: Apply to wound bed as instructed Secondary Dressing Woven Gauze Sponge, Non-Sterile 4x4 in Discharge Instruction: Apply over primary dressing as directed. Secured With Compression Wrap Urgo K2 Lite, (equivalent to a 3 layer) two layer compression system, regular Discharge Instruction: Apply Urgo K2 Lite as directed (alternative to 3 layer compression). Compression Stockings Add-Ons Wound #11 (Knee) Wound Laterality: Left Cleanser Soap and Water  Discharge Instruction: May shower and wash wound with dial antibacterial soap and water  prior to dressing change. [name], [name] ([id number]) [id number].pdf Page 6 of 15 Vashe 5.8 (oz) Discharge Instruction: Cleanse the wound with Vashe prior to applying a clean dressing using gauze sponges, not tissue or cotton balls. Peri-Wound Care Skin Prep Discharge Instruction: Use skin prep as directed Topical Primary Dressing Maxorb Extra Ag+ Alginate Dressing, 2x2 (in/in) Discharge  Instruction: Apply to wound bed as instructed Secondary  Dressing Zetuvit Plus Silicone Border Dressing 3x3 (in/in) Discharge Instruction: Apply silicone border over primary dressing as directed. Secured With Compression Wrap Compression Stockings Add-Ons Wound #8 (Lower Leg) Wound Laterality: Right, Circumferential Cleanser Peri-Wound Care Topical Primary Dressing Secondary Dressing Secured With Compression Wrap Compression Stockings Add-Ons Wound #9 (Gluteus) Wound Laterality: Right Cleanser Soap and Water  Discharge Instruction: May shower and wash wound with dial antibacterial soap and water  prior to dressing change. Vashe 5.8 (oz) Discharge Instruction: Cleanse the wound with Vashe prior to applying a clean dressing using gauze sponges, not tissue or cotton balls. Peri-Wound Care Skin Prep Discharge Instruction: Use skin prep as directed Topical Primary Dressing Maxorb Extra Ag+ Alginate Dressing, 2x2 (in/in) Discharge Instruction: Apply to wound bed as instructed Secondary Dressing Zetuvit Plus Silicone Border Dressing 3x3 (in/in) Discharge Instruction: Apply silicone border over primary dressing as directed. Secured With Compression Wrap Compression Stockings Facilities Manager) Signed: 08/13/2023 4:26:08 PM By: Marolyn Nest MD FACS JORGEN, WOLFINGER ([id number]) PM By: Marolyn Nest MD FACS [phone].pdf Page 7 of 15 Signed: 08/13/2023 4:26:08 Entered By: Marolyn Nest on 08/13/2023 15:38:03 -------------------------------------------------------------------------------- Multi-Disciplinary Care Plan Details Patient Name: Date of Service: [place] [name] 08/13/2023 2:30 PM Medical Record Number: [id number] Patient Account Number: [id number] Date of Birth/Sex: Treating RN: 08/20/1931 (89 y.o. NETTY Drury Nestle Primary Care Trask Vosler: ILEEN BROWNING Other Clinician: Referring Osinachi Navarrette: Treating Madalaine Portier/Extender: Marolyn Nest [facility], MICHELLE Weeks in Treatment:  2 Active Inactive Wound/Skin Impairment Nursing Diagnoses: Impaired tissue integrity Knowledge deficit related to ulceration/compromised skin integrity Goals: Patient will have a decrease in wound volume by X% from date: (specify in notes) Date Initiated: 07/24/2023 Target Resolution Date: 09/06/2023 Goal Status: Active Patient/caregiver will verbalize understanding of skin care regimen Date Initiated: 07/24/2023 Target Resolution Date: 09/05/2023 Goal Status: Active Ulcer/skin breakdown will have a volume reduction of 30% by week 4 Date Initiated: 07/24/2023 Target Resolution Date: 09/05/2023 Goal Status: Active Interventions: Assess patient/caregiver ability to obtain necessary supplies Assess patient/caregiver ability to perform ulcer/skin care regimen upon admission and as needed Assess ulceration(s) every visit Notes: Electronic Signature(s) Signed: 08/13/2023 4:41:36 PM By: Drury Nestle RN, BSN Entered By: Drury Nestle on 08/13/2023 15:15:50 -------------------------------------------------------------------------------- Non-Wound Condition Assessment Details Patient Name: Date of Service: [name], Joss [name]. 08/13/2023 2:30 PM Medical Record Number: [id number] Patient Account Number: [id number] Date of Birth/Sex: Treating RN: [birth year] (89 y.o. NETTY Drury Nestle Primary Care Kina Shiffman: ILEEN BROWNING Other Clinician: Referring Deyton Ellenbecker: Treating Guiselle Mian/Extender: Marolyn Nest [facility], MICHELLE Weeks in Treatment: 2 Non-Wound Condition: Condition: Lymphedema Location: Leg Side: Left Periwound Skin Texture [name], [name] ([id number]) [phone].pdf Page 8 of 15 Texture Color No Abnormalities Noted: No No Abnormalities Noted: No Callus: No Atrophie Blanche: No Crepitus: No Cyanosis: No Excoriation: No Ecchymosis: No Friable: No Erythema: No Induration: No Hemosiderin Staining: Yes Rash: No Mottled: No Scarring: No Pallor:  No Rubor: No Moisture No Abnormalities Noted: No Temperature / Pain Dry / Scaly: No Temperature: No Abnormality Maceration: No Lymphedema Assessment Symptoms: (check all that apply) Edema - Unable to Control Electronic Signature(s) Signed: 08/13/2023 4:41:36 PM By: Drury Nestle RN, BSN Entered By: Drury Nestle on 08/13/2023 15:28:19 -------------------------------------------------------------------------------- Pain Assessment Details Patient Name: Date of Service: [name], Nelson [name]. 08/13/2023 2:30 PM Medical Record Number: [id number] Patient Account Number: [id number] Date of Birth/Sex: Treating RN: 10-03-1931 (89 y.o. M) Primary Care Marlisa Caridi: ILEEN BROWNING Other Clinician: Referring Nefi Musich: Treating Jeromey Kruer/Extender: Marolyn Nest [facility], MICHELLE Weeks in Treatment:  2 Active Problems Location of Pain Severity and Description of Pain Patient Has Paino No Site Locations Pain Management and Medication Current Pain Management: Electronic Signature(s) Signed: 08/13/2023 3:31:42 PM By: Okey Bonier Entered By: Okey Bonier on 08/13/2023 14:51:51 [name] ([id number]) [id number] Page 9 of 15 -------------------------------------------------------------------------------- Patient/Caregiver Education Details Patient Name: Date of Service: [place] [name] 12/31/2024andnbsp2:30 PM Medical Record Number: [id number] Patient Account Number: [id number] Date of Birth/Gender: Treating RN: 12/05/1931 (89 y.o. NETTY Drury Nestle Primary Care Physician: ILEEN BROWNING Other Clinician: Referring Physician: Treating Physician/Extender: Marolyn Nest [facility], MICHELLE Weeks in Treatment: 2 Education Assessment Education Provided To: Patient Education Topics Provided Venous: Handouts: Controlling Swelling with Compression Stockings Methods: Explain/Verbal Responses: Reinforcements needed Electronic Signature(s) Signed:  08/13/2023 4:41:36 PM By: Drury Nestle RN, BSN Entered By: Drury Nestle on 08/13/2023 15:16:33 -------------------------------------------------------------------------------- Wound Assessment Details Patient Name: Date of Service: [name], Jonah [name]. 08/13/2023 2:30 PM Medical Record Number: [id number] Patient Account Number: [id number] Date of Birth/Sex: Treating RN: [birth year] (89 y.o. NETTY Drury Nestle Primary Care Clairissa Valvano: ILEEN BROWNING Other Clinician: Referring Deontaye Civello: Treating Iyesha Such/Extender: Marolyn Nest [facility], MICHELLE Weeks in Treatment: 2 Wound Status Wound Number: 10 Primary Lymphedema Etiology: Wound Location: Right, Lateral Lower Leg Wound Open Wounding Event: Gradually Appeared Status: Date Acquired: 08/13/2023 Comorbid Cataracts, Congestive Heart Failure, Coronary Artery Disease, Weeks Of Treatment: 0 History: Hypertension, Peripheral Venous Disease, Osteoarthritis, Clustered Wound: No Neuropathy Wound Measurements Length: (cm) 0.7 Width: (cm) 0.7 Depth: (cm) 0.1 Area: (cm) 0.385 Volume: (cm) 0.038 % Reduction in Area: % Reduction in Volume: Epithelialization: Small (1-33%) Tunneling: No Undermining: No Wound Description Classification: Full Thickness Without Exposed Suppor Wound Margin: Distinct, outline attached Exudate Amount: Medium Exudate Type: Serosanguineous Exudate Color: red, brown t Structures Foul Odor After Cleansing: No Slough/Fibrino Yes Wound Bed Granulation Amount: Medium (34-66%) Exposed Structure Granulation Quality: Red, Pink Fascia Exposed: No Necrotic Amount: Medium (34-66%) Fat Layer (Subcutaneous Tissue) Exposed: Yes GRANITE, GODMAN [name] ([id number]) [id number] Page 10 of 15 Necrotic Quality: Adherent Slough Tendon Exposed: No Muscle Exposed: No Joint Exposed: No Bone Exposed: No Periwound Skin Texture Texture Color No Abnormalities Noted: No No Abnormalities Noted: No Callus:  No Atrophie Blanche: No Crepitus: No Cyanosis: No Excoriation: No Ecchymosis: No Induration: No Erythema: No Rash: No Hemosiderin Staining: Yes Scarring: No Mottled: No Pallor: No Moisture Rubor: No No Abnormalities Noted: No Dry / Scaly: No Temperature / Pain Maceration: No Temperature: No Abnormality Treatment Notes Wound #10 (Lower Leg) Wound Laterality: Right, Lateral Cleanser Soap and Water  Discharge Instruction: May shower and wash wound with dial antibacterial soap and water  prior to dressing change. Vashe 5.8 (oz) Discharge Instruction: Cleanse the wound with Vashe prior to applying a clean dressing using gauze sponges, not tissue or cotton balls. Peri-Wound Care Sween Lotion (Moisturizing lotion) Discharge Instruction: Apply moisturizing lotion as directed Topical Primary Dressing Maxorb Extra Ag+ Alginate Dressing, 2x2 (in/in) Discharge Instruction: Apply to wound bed as instructed Secondary Dressing Woven Gauze Sponge, Non-Sterile 4x4 in Discharge Instruction: Apply over primary dressing as directed. Secured With Compression Wrap Urgo K2 Lite, (equivalent to a 3 layer) two layer compression system, regular Discharge Instruction: Apply Urgo K2 Lite as directed (alternative to 3 layer compression). Compression Stockings Add-Ons Electronic Signature(s) Signed: 08/13/2023 4:41:36 PM By: Drury Nestle RN, BSN Entered By: Drury Nestle on 08/13/2023 15:07:49 -------------------------------------------------------------------------------- Wound Assessment Details Patient Name: Date of Service: [name], Klein [name]. 08/13/2023 2:30 PM Medical Record Number: [id number] Patient Account Number: [id number] Date of Birth/Sex:  Treating RN: [birth year] (89 y.o. NETTY Drury Nestle Primary Care Courtland Coppa: ILEEN BROWNING Other Clinician: Referring Angelle Isais: Treating Verniece Encarnacion/Extender: Marolyn Nest [facility], MICHELLE Weeks in Treatment: 2 Wound Status [name], [name] [name]  ([id number]) [id number] Page 11 of 15 Wound Number: 11 Primary Trauma, Other Etiology: Wound Location: Left Knee Wound Open Wounding Event: Trauma Status: Date Acquired: 08/11/2023 Comorbid Cataracts, Congestive Heart Failure, Coronary Artery Disease, Weeks Of Treatment: 0 History: Hypertension, Peripheral Venous Disease, Osteoarthritis, Clustered Wound: No Neuropathy Wound Measurements Length: (cm) 1 Width: (cm) 1.2 Depth: (cm) 0.1 Area: (cm) 0.942 Volume: (cm) 0.094 % Reduction in Area: % Reduction in Volume: Epithelialization: None Tunneling: No Undermining: No Wound Description Classification: Unclassifiable Wound Margin: Distinct, outline attached Exudate Amount: Medium Exudate Type: Serosanguineous Exudate Color: red, brown Foul Odor After Cleansing: No Slough/Fibrino Yes Wound Bed Granulation Amount: None Present (0%) Exposed Structure Necrotic Amount: Large (67-100%) Fascia Exposed: No Necrotic Quality: Eschar, Adherent Slough Fat Layer (Subcutaneous Tissue) Exposed: No Tendon Exposed: No Muscle Exposed: No Joint Exposed: No Bone Exposed: No Periwound Skin Texture Texture Color No Abnormalities Noted: No No Abnormalities Noted: No Callus: No Atrophie Blanche: No Crepitus: No Cyanosis: No Excoriation: No Ecchymosis: No Induration: No Erythema: No Rash: No Hemosiderin Staining: No Scarring: No Mottled: No Pallor: No Moisture Rubor: No No Abnormalities Noted: No Dry / Scaly: No Maceration: No Treatment Notes Wound #11 (Knee) Wound Laterality: Left Cleanser Soap and Water  Discharge Instruction: May shower and wash wound with dial antibacterial soap and water  prior to dressing change. Vashe 5.8 (oz) Discharge Instruction: Cleanse the wound with Vashe prior to applying a clean dressing using gauze sponges, not tissue or cotton balls. Peri-Wound Care Skin Prep Discharge Instruction: Use skin prep as  directed Topical Primary Dressing Maxorb Extra Ag+ Alginate Dressing, 2x2 (in/in) Discharge Instruction: Apply to wound bed as instructed Secondary Dressing Zetuvit Plus Silicone Border Dressing 3x3 (in/in) Discharge Instruction: Apply silicone border over primary dressing as directed. Secured With Compression Wrap Compression Stockings [place], Kyler [name] ([id number]) [id number] Page 12 of 15 Add-Ons Electronic Signature(s) Signed: 08/13/2023 4:41:36 PM By: Drury Nestle RN, BSN Entered By: Drury Nestle on 08/13/2023 15:08:58 -------------------------------------------------------------------------------- Wound Assessment Details Patient Name: Date of Service: [name], Hector [name]. 08/13/2023 2:30 PM Medical Record Number: [id number] Patient Account Number: [id number] Date of Birth/Sex: Treating RN: 04-Apr-1932 (89 y.o. NETTY Drury Nestle Primary Care Quinlan Mcfall: ILEEN BROWNING Other Clinician: Referring Kora Groom: Treating Shelagh Rayman/Extender: Marolyn Nest [facility], MICHELLE Weeks in Treatment: 2 Wound Status Wound Number: 8 Primary Venous Leg Ulcer Etiology: Wound Location: Right, Circumferential Lower Leg Wound Healed - Epithelialized Wounding Event: Gradually Appeared Status: Date Acquired: 07/04/2023 Comorbid Cataracts, Congestive Heart Failure, Coronary Artery Disease, Weeks Of Treatment: 2 History: Hypertension, Peripheral Venous Disease, Osteoarthritis, Clustered Wound: No Neuropathy Photos Wound Measurements Length: (cm) Width: (cm) Depth: (cm) Area: (cm) Volume: (cm) 0 % Reduction in Area: 100% 0 % Reduction in Volume: 100% 0 Epithelialization: Large (67-100%) 0 Tunneling: No 0 Undermining: No Wound Description Classification: Full Thickness Without Exposed Support Wound Margin: Flat and Intact Exudate Amount: None Present Structures Foul Odor After Cleansing: No Slough/Fibrino No Wound Bed Granulation Amount: None Present (0%)  Exposed Structure Necrotic Amount: None Present (0%) Fascia Exposed: No Fat Layer (Subcutaneous Tissue) Exposed: Yes Tendon Exposed: No Muscle Exposed: No Joint Exposed: No Bone Exposed: No Periwound Skin Texture Texture Color No Abnormalities Noted: Yes No Abnormalities Noted: No Hemosiderin Staining: Yes Moisture No Abnormalities Noted: Yes Temperature / Pain Temperature: No Abnormality Tenderness on Palpation: Yes Dunnam,  Moses [name] ([id number]) [id number] Page 13 of 15 Treatment Notes Wound #8 (Lower Leg) Wound Laterality: Right, Circumferential Cleanser Peri-Wound Care Topical Primary Dressing Secondary Dressing Secured With Compression Wrap Compression Stockings Add-Ons Electronic Signature(s) Signed: 08/13/2023 4:41:36 PM By: Drury Nestle RN, BSN Entered By: Drury Nestle on 08/13/2023 15:17:31 -------------------------------------------------------------------------------- Wound Assessment Details Patient Name: Date of Service: [name], Rajon [name]. 08/13/2023 2:30 PM Medical Record Number: [id number] Patient Account Number: [id number] Date of Birth/Sex: Treating RN: Aug 12, 1932 (89 y.o. M) Primary Care Cyrus Ramsburg: ILEEN BROWNING Other Clinician: Referring Terisha Losasso: Treating Hadi Dubin/Extender: Marolyn Nest [facility], MICHELLE Weeks in Treatment: 2 Wound Status Wound Number: 9 Primary Pressure Ulcer Etiology: Wound Location: Right Gluteus Wound Open Wounding Event: Pressure Injury Status: Date Acquired: 07/29/2023 Comorbid Cataracts, Congestive Heart Failure, Coronary Artery Disease, Weeks Of Treatment: 1 History: Hypertension, Peripheral Venous Disease, Osteoarthritis, Clustered Wound: No Neuropathy Photos Wound Measurements Length: (cm) 0.7 Width: (cm) 0.9 Depth: (cm) 0.1 Area: (cm) 0.495 Volume: (cm) 0.049 % Reduction in Area: 36.9% % Reduction in Volume: 38% Epithelialization: None Tunneling: No Undermining:  No Wound Description Classification: Category/Stage III Wound Margin: Flat and Intact Esper, Jacobb [name] ([id number]) Exudate Amount: Small Exudate Type: Serosanguineous Exudate Color: red, brown Foul Odor After Cleansing: No Slough/Fibrino Yes [id number].pdf Page 14 of 15 Wound Bed Granulation Amount: Small (1-33%) Exposed Structure Granulation Quality: Red Fascia Exposed: No Necrotic Amount: Large (67-100%) Fat Layer (Subcutaneous Tissue) Exposed: Yes Necrotic Quality: Adherent Slough Tendon Exposed: No Muscle Exposed: No Joint Exposed: No Bone Exposed: No Periwound Skin Texture Texture Color No Abnormalities Noted: Yes No Abnormalities Noted: Yes Moisture Temperature / Pain No Abnormalities Noted: Yes Temperature: No Abnormality Tenderness on Palpation: Yes Treatment Notes Wound #9 (Gluteus) Wound Laterality: Right Cleanser Soap and Water  Discharge Instruction: May shower and wash wound with dial antibacterial soap and water  prior to dressing change. Vashe 5.8 (oz) Discharge Instruction: Cleanse the wound with Vashe prior to applying a clean dressing using gauze sponges, not tissue or cotton balls. Peri-Wound Care Skin Prep Discharge Instruction: Use skin prep as directed Topical Primary Dressing Maxorb Extra Ag+ Alginate Dressing, 2x2 (in/in) Discharge Instruction: Apply to wound bed as instructed Secondary Dressing Zetuvit Plus Silicone Border Dressing 3x3 (in/in) Discharge Instruction: Apply silicone border over primary dressing as directed. Secured With Compression Wrap Compression Stockings Facilities Manager) Signed: 08/13/2023 4:41:36 PM By: Drury Nestle RN, BSN Entered By: Drury Nestle on 08/13/2023 15:09:55 -------------------------------------------------------------------------------- Vitals Details Patient Name: Date of Service: [name], Shaye [name]. 08/13/2023 2:30 PM Medical Record Number: [id number] Patient  Account Number: [id number] Date of Birth/Sex: Treating RN: 11/28/1931 (89 y.o. M) Primary Care Mylan Lengyel: ILEEN BROWNING Other Clinician: Referring Verlisa Vara: Treating Neyah Ellerman/Extender: Marolyn Nest [facility], MICHELLE Weeks in Treatment: 2 Vital Signs KAILASH, HINZE [name] ([id number]) [id number] Page 15 of 15 Time Taken: 02:51 Temperature (F): 98.1 Pulse (bpm): 82 Respiratory Rate (breaths/min): 18 Blood Pressure (mmHg): 110/60 Reference Range: 80 - 120 mg / dl Electronic Signature(s) Signed: 08/13/2023 3:31:42 PM By: Okey Bonier Entered By: Okey Bonier on 08/13/2023 14:51:45

## 2023-08-15 LAB — DERMATOLOGY PATHOLOGY

## 2023-08-21 ENCOUNTER — Other Ambulatory Visit: Payer: Self-pay | Admitting: *Deleted

## 2023-08-21 ENCOUNTER — Encounter (HOSPITAL_BASED_OUTPATIENT_CLINIC_OR_DEPARTMENT_OTHER): Payer: Medicare Other | Attending: General Surgery | Admitting: General Surgery

## 2023-08-21 DIAGNOSIS — L97829 Non-pressure chronic ulcer of other part of left lower leg with unspecified severity: Secondary | ICD-10-CM | POA: Insufficient documentation

## 2023-08-21 DIAGNOSIS — I872 Venous insufficiency (chronic) (peripheral): Secondary | ICD-10-CM | POA: Diagnosis not present

## 2023-08-21 DIAGNOSIS — I509 Heart failure, unspecified: Secondary | ICD-10-CM | POA: Insufficient documentation

## 2023-08-21 DIAGNOSIS — I890 Lymphedema, not elsewhere classified: Secondary | ICD-10-CM | POA: Diagnosis not present

## 2023-08-21 DIAGNOSIS — L97818 Non-pressure chronic ulcer of other part of right lower leg with other specified severity: Secondary | ICD-10-CM | POA: Insufficient documentation

## 2023-08-21 DIAGNOSIS — I110 Hypertensive heart disease with heart failure: Secondary | ICD-10-CM | POA: Diagnosis not present

## 2023-08-21 DIAGNOSIS — I87333 Chronic venous hypertension (idiopathic) with ulcer and inflammation of bilateral lower extremity: Secondary | ICD-10-CM | POA: Diagnosis not present

## 2023-08-21 DIAGNOSIS — I83811 Varicose veins of right lower extremities with pain: Secondary | ICD-10-CM

## 2023-08-21 DIAGNOSIS — Z86711 Personal history of pulmonary embolism: Secondary | ICD-10-CM | POA: Diagnosis not present

## 2023-08-21 DIAGNOSIS — I2510 Atherosclerotic heart disease of native coronary artery without angina pectoris: Secondary | ICD-10-CM | POA: Diagnosis not present

## 2023-08-21 NOTE — Progress Notes (Signed)
 [name] ([id number]) [phone].pdf Page 1 of 10 Visit Report for 08/21/2023 Arrival Information Details Patient Name: Date of Service: [name] 08/21/2023 1:15 PM Medical Record Number: [id number] Patient Account Number: [id number] Date of Birth/Sex: Treating RN: Dec 08, 1931 (89 y.o. M) Primary Care [name]: [name] Other Clinician: Referring [name]: Treating [name]/Extender: [name] [facility], [name] in Treatment: 4 Visit Information History Since Last Visit Added or deleted any medications: No Patient Arrived: Walker Any new allergies or adverse reactions: No Arrival Time: 13:27 Had a fall or experienced change in No Accompanied By: self activities of daily living that may affect Transfer Assistance: None risk of falls: Patient Identification Verified: Yes Signs or symptoms of abuse/neglect since last visito No Secondary Verification Process Completed: Yes Hospitalized since last visit: No Patient Requires Transmission-Based Precautions: No Implantable device outside of the clinic excluding No Patient Has Alerts: No cellular tissue based products placed in the center since last visit: Pain Present Now: No Electronic Signature(s) Signed: 08/21/2023 1:50:33 PM By: [name] Entered By: [name] on 08/21/2023 10:27:37 -------------------------------------------------------------------------------- Encounter Discharge Information Details Patient Name: Date of Service: [name] [name], [name] 08/21/2023 1:15 PM Medical Record Number: [id number] Patient Account Number: [id number] Date of Birth/Sex: Treating RN: 1932/07/08 (89 y.o. [name] Primary Care Makyia Erxleben: [name] Other Clinician: Referring Esbeidy Mclaine: Treating Tomi Paddock/Extender: [name] [facility], [name] in Treatment: 4 Encounter Discharge Information Items Post Procedure Vitals Discharge Condition: Stable Temperature  (F): 98.2 Ambulatory Status: Walker Pulse (bpm): 60 Discharge Destination: Home Respiratory Rate (breaths/min): 18 Transportation: Private Auto Blood Pressure (mmHg): 141/74 Accompanied By: self Schedule Follow-up Appointment: Yes Clinical Summary of Care: Patient Declined Electronic Signature(s) Signed: 08/21/2023 3:11:11 PM By: Cammie Sailors RN, BSN Entered By: Cammie [name] on 08/21/2023 12:04:45 Jordan Jordan BRAVO ([id number]) [id number] Page 2 of 10 -------------------------------------------------------------------------------- Lower Extremity Assessment Details Patient Name: Date of Service: [name] 08/21/2023 1:15 PM Medical Record Number: [id number] Patient Account Number: [id number] Date of Birth/Sex: Treating RN: 06-Apr-1932 (89 y.o. [name] Primary Care Raymund Manrique: [name] Other Clinician: Referring Tay Whitwell: Treating Daiton Cowles/Extender: [name] [facility], [name] in Treatment: 4 Edema Assessment Assessed: [Left: No] [Right: No] Edema: [Left: No] [Right: Yes] Calf Left: Right: Point of Measurement: From Medial Instep 32.5 cm 33.5 cm Ankle Left: Right: Point of Measurement: From Medial Instep 22.5 cm 23.5 cm Vascular Assessment Extremity colors, hair growth, and conditions: Extremity Color: [Left:Normal] [Right:Normal] Hair Growth on Extremity: [Left:No] [Right:No] Temperature of Extremity: [Left:Warm] [Right:Warm] Capillary Refill: [Left:< 3 seconds] [Right:< 3 seconds] Dependent Rubor: [Left:No No] [Right:No No] Electronic Signature(s) Signed: 08/21/2023 3:11:11 PM By: Cammie Sailors RN, BSN Entered By: Cammie [name] on 08/21/2023 10:50:04 -------------------------------------------------------------------------------- Multi Wound Chart Details Patient Name: Date of Service: [name], [name] 08/21/2023 1:15 PM Medical Record Number: [id number] Patient Account Number: [id number] Date of  Birth/Sex: Treating RN: 1932/01/17 (89 y.o. M) Primary Care Darline Faith: [name] Other Clinician: Referring Magdiel Bartles: Treating Prynce Jacober/Extender: [name] [facility], [name] in Treatment: 4 Vital Signs Height(in): Pulse(bpm): 60 Weight(lbs): Blood Pressure(mmHg): 141/74 Body Mass Index(BMI): Temperature(F): 98.2 Respiratory Rate(breaths/min): 18 [10:Photos: No Photos] [11:No Photos] [[id number] Page 3 of 10] Right, Lateral Lower Leg Left Knee Right Gluteus Wound Location: Gradually Appeared Trauma Pressure Injury Wounding Event: Lymphedema Trauma, Other Pressure Ulcer Primary Etiology: Cataracts, Congestive Heart Failure, Cataracts, Congestive Heart Failure, Cataracts, Congestive Heart Failure, Comorbid History: Coronary Artery Disease, Coronary Artery Disease, Coronary Artery Disease, Hypertension, Peripheral Venous Hypertension, Peripheral Venous Hypertension,  Peripheral Venous Disease, Osteoarthritis, Neuropathy Disease, Osteoarthritis, Neuropathy Disease, Osteoarthritis, Neuropathy 08/13/2023 08/11/2023 07/29/2023 Date Acquired: 1 1 2  [name] of Treatment: Healed - Epithelialized Open Open Wound Status: No No No Wound Recurrence: 0x0x0 0.5x0.5x0.1 0.3x0.3x0.1 Measurements L x W x D (cm) 0 0.196 0.071 A (cm) : rea 0 0.02 0.007 Volume (cm) : 100.00% 79.20% 91.00% % Reduction in A rea: 100.00% 78.70% 91.10% % Reduction in Volume: Full Thickness Without Exposed Full Thickness Without Exposed Category/Stage III Classification: Support Structures Support Structures None Present Medium Small Exudate A mount: N/A Serosanguineous Serosanguineous Exudate Type: N/A red, brown red, brown Exudate Color: Distinct, outline attached Distinct, outline attached Flat and Intact Wound Margin: None Present (0%) Large (67-100%) Small (1-33%) Granulation A mount: N/A Red Red Granulation Quality: None Present (0%) Small (1-33%)  Large (67-100%) Necrotic A mount: Fascia: No Fat Layer (Subcutaneous Tissue): Yes Fat Layer (Subcutaneous Tissue): Yes Exposed Structures: Fat Layer (Subcutaneous Tissue): No Fascia: No Fascia: No Tendon: No Tendon: No Tendon: No Muscle: No Muscle: No Muscle: No Joint: No Joint: No Joint: No Bone: No Bone: No Bone: No Large (67-100%) Large (67-100%) Large (67-100%) Epithelialization: N/A Debridement - Excisional Debridement - Excisional Debridement: Pre-procedure Verification/Time Out N/A 14:15 14:15 Taken: N/A Subcutaneous, Slough Subcutaneous, Slough Tissue Debrided: N/A Skin/Subcutaneous Tissue Skin/Subcutaneous Tissue Level: N/A 0.2 0.07 Debridement A (sq cm): rea N/A Curette Curette Instrument: N/A Minimum Minimum Bleeding: N/A Pressure Pressure Hemostasis A chieved: N/A Procedure was tolerated well Procedure was tolerated well Debridement Treatment Response: N/A 0.5x0.5x0.1 0.3x0.3x0.1 Post Debridement Measurements L x W x D (cm) N/A 0.02 0.007 Post Debridement Volume: (cm) N/A N/A Category/Stage III Post Debridement Stage: Excoriation: No Excoriation: No No Abnormalities Noted Periwound Skin Texture: Induration: No Induration: No Callus: No Callus: No Crepitus: No Crepitus: No Rash: No Rash: No Scarring: No Scarring: No Maceration: No Maceration: No No Abnormalities Noted Periwound Skin Moisture: Dry/Scaly: No Dry/Scaly: No Hemosiderin Staining: Yes Atrophie Blanche: No No Abnormalities Noted Periwound Skin Color: Atrophie Blanche: No Cyanosis: No Cyanosis: No Ecchymosis: No Ecchymosis: No Erythema: No Erythema: No Hemosiderin Staining: No Mottled: No Mottled: No Pallor: No Pallor: No Rubor: No Rubor: No No Abnormality N/A No Abnormality Temperature: N/A N/A Yes Tenderness on Palpation: N/A Debridement Debridement Procedures Performed: Treatment Notes Wound #11 (Knee) Wound Laterality: Left Cleanser Soap and  Water  Discharge Instruction: May shower and wash wound with dial antibacterial soap and water  prior to dressing change. Vashe 5.8 (oz) Discharge Instruction: Cleanse the wound with Vashe prior to applying a clean dressing using gauze sponges, not tissue or cotton balls. Peri-Wound Care Skin Prep Discharge Instruction: Use skin prep as directed YUG, LORIA ([id number]) [phone].pdf Page 4 of 10 Topical Primary Dressing Maxorb Extra Ag+ Alginate Dressing, 2x2 (in/in) Discharge Instruction: Apply to wound bed as instructed Secondary Dressing Zetuvit Plus Silicone Border Dressing 3x3 (in/in) Discharge Instruction: Apply silicone border over primary dressing as directed. Secured With Compression Wrap Compression Stockings Add-Ons Wound #9 (Gluteus) Wound Laterality: Right Cleanser Soap and Water  Discharge Instruction: May shower and wash wound with dial antibacterial soap and water  prior to dressing change. Vashe 5.8 (oz) Discharge Instruction: Cleanse the wound with Vashe prior to applying a clean dressing using gauze sponges, not tissue or cotton balls. Peri-Wound Care Skin Prep Discharge Instruction: Use skin prep as directed Topical Primary Dressing Maxorb Extra Ag+ Alginate Dressing, 2x2 (in/in) Discharge Instruction: Apply to wound bed as instructed Secondary Dressing Zetuvit Plus Silicone Border Dressing 3x3 (in/in) Discharge Instruction: Apply silicone border  over primary dressing as directed. Secured With Compression Wrap Compression Stockings Facilities Manager) Signed: 08/21/2023 3:08:46 PM By: Jordan Nest MD FACS Previous Signature: 08/21/2023 2:31:31 PM Version By: Jordan Nest MD FACS Entered By: [name] on 08/21/2023 12:08:46 -------------------------------------------------------------------------------- Multi-Disciplinary Care Plan Details Patient Name: Date of Service: [name], Travelle [name]. 08/21/2023 1:15  PM Medical Record Number: [id number] Patient Account Number: [id number] Date of Birth/Sex: Treating RN: 1932-07-10 (89 y.o. [name] Primary Care Zyere Jiminez: [name] Other Clinician: Referring Chantee Cerino: Treating Byrant Valent/Extender: [name] [facility], [name] in Treatment: [address] COLEMAN, KALAS [name] ([id number]) [id number] Page 5 of 10 Wound/Skin Impairment Nursing Diagnoses: Impaired tissue integrity Knowledge deficit related to ulceration/compromised skin integrity Goals: Patient will have a decrease in wound volume by X% from date: (specify in notes) Date Initiated: 07/24/2023 Target Resolution Date: 09/06/2023 Goal Status: Active Patient/caregiver will verbalize understanding of skin care regimen Date Initiated: 07/24/2023 Target Resolution Date: 09/05/2023 Goal Status: Active Ulcer/skin breakdown will have a volume reduction of 30% by week 4 Date Initiated: 07/24/2023 Target Resolution Date: 09/05/2023 Goal Status: Active Interventions: Assess patient/caregiver ability to obtain necessary supplies Assess patient/caregiver ability to perform ulcer/skin care regimen upon admission and as needed Assess ulceration(s) every visit Notes: Electronic Signature(s) Signed: 08/21/2023 3:11:11 PM By: Cammie Sailors RN, BSN Entered By: Cammie [name] on 08/21/2023 12:03:17 -------------------------------------------------------------------------------- Pain Assessment Details Patient Name: Date of Service: [name], [name] 08/21/2023 1:15 PM Medical Record Number: [id number] Patient Account Number: [id number] Date of Birth/Sex: Treating RN: [birth year] (89 y.o. M) Primary Care Kitana Gage: [name] Other Clinician: Referring Belinda Bringhurst: Treating Kirin Pastorino/Extender: [name] [facility], [name] in Treatment: 4 Active Problems Location of Pain Severity and Description of Pain Patient Has Paino No Site  Locations Pain Management and Medication Current Pain Management: Electronic Signature(s) Signed: 08/21/2023 1:50:33 PM By: Jordan Revonda [name] ([id number])EF ByBETHA Jordan Revonda Elgie.pdf Page 6 of 10 Signed: 08/21/2023 1:50:33 Entered By: Jordan Revonda on 08/21/2023 10:28:05 -------------------------------------------------------------------------------- Patient/Caregiver Education Details Patient Name: Date of Service: [name] [name], [name] 1/8/2025andnbsp1:15 PM Medical Record Number: [id number] Patient Account Number: [id number] Date of Birth/Gender: Treating RN: [birth year] (89 y.o. [name] Primary Care Physician: [name] Other Clinician: Referring Physician: Treating Physician/Extender: [name] [facility], [name] in Treatment: 4 Education Assessment Education Provided To: Patient Education Topics Provided Wound/Skin Impairment: Methods: Explain/Verbal Responses: State content correctly Electronic Signature(s) Signed: 08/21/2023 3:11:11 PM By: Cammie Sailors RN, BSN Entered By: Cammie [name] on 08/21/2023 12:03:29 -------------------------------------------------------------------------------- Wound Assessment Details Patient Name: Date of Service: [name], [name] 08/21/2023 1:15 PM Medical Record Number: [id number] Patient Account Number: [id number] Date of Birth/Sex: Treating RN: [birth year] (89 y.o. M) Primary Care Neamiah Sciarra: [name] Other Clinician: Referring Heaven Wandell: Treating Elpidio Thielen/Extender: [name] [facility], [name] in Treatment: 4 Wound Status Wound Number: 10 Primary Lymphedema Etiology: Wound Location: Right, Lateral Lower Leg Wound Healed - Epithelialized Wounding Event: Gradually Appeared Status: Date Acquired: 08/13/2023 Comorbid Cataracts, Congestive Heart Failure, Coronary Artery Disease, [name] Of Treatment: 1 History: Hypertension, Peripheral Venous Disease,  Osteoarthritis, Clustered Wound: No Neuropathy Wound Measurements Length: (cm) Width: (cm) Depth: (cm) Area: (cm) Volume: (cm) 0 % Reduction in Area: 100% 0 % Reduction in Volume: 100% 0 Epithelialization: Large (67-100%) 0 Tunneling: No 0 Undermining: No Wound Description Classification: Full Thickness Without Exposed Support Structures Wound Margin: Distinct, outline attached Exudate Amount: None Present [name], RAYMUNDO [name] ([id number]) Wound Bed Granulation Amount: None Present (0%) Necrotic Amount: None Present (0%)  Foul Odor After Cleansing: No Slough/Fibrino No [id number] Page 7 of 10 Exposed Structure Fascia Exposed: No Fat Layer (Subcutaneous Tissue) Exposed: No Tendon Exposed: No Muscle Exposed: No Joint Exposed: No Bone Exposed: No Periwound Skin Texture Texture Color No Abnormalities Noted: No No Abnormalities Noted: No Callus: No Atrophie Blanche: No Crepitus: No Cyanosis: No Excoriation: No Ecchymosis: No Induration: No Erythema: No Rash: No Hemosiderin Staining: Yes Scarring: No Mottled: No Pallor: No Moisture Rubor: No No Abnormalities Noted: No Dry / Scaly: No Temperature / Pain Maceration: No Temperature: No Abnormality Electronic Signature(s) Signed: 08/21/2023 3:11:11 PM By: Cammie Sailors RN, BSN Previous Signature: 08/21/2023 1:50:33 PM Version By: [name] Entered By: Cammie [name] on 08/21/2023 11:13:39 -------------------------------------------------------------------------------- Wound Assessment Details Patient Name: Date of Service: [name], Jordan BRAVO. 08/21/2023 1:15 PM Medical Record Number: [id number] Patient Account Number: [id number] Date of Birth/Sex: Treating RN: [birth year] (89 y.o. [name] Primary Care Rohit Deloria: [name] Other Clinician: Referring Jaana Brodt: Treating Leandro Berkowitz/Extender: [name] [facility], [name] in Treatment: 4 Wound Status Wound Number: 11  Primary Trauma, Other Etiology: Wound Location: Left Knee Wound Open Wounding Event: Trauma Status: Date Acquired: 08/11/2023 Comorbid Cataracts, Congestive Heart Failure, Coronary Artery Disease, [name] Of Treatment: 1 History: Hypertension, Peripheral Venous Disease, Osteoarthritis, Clustered Wound: No Neuropathy Wound Measurements Length: (cm) 0.5 Width: (cm) 0.5 Depth: (cm) 0.1 Area: (cm) 0.196 Volume: (cm) 0.02 % Reduction in Area: 79.2% % Reduction in Volume: 78.7% Epithelialization: Large (67-100%) Tunneling: No Undermining: No Wound Description Classification: Full Thickness Without Exposed Suppor Wound Margin: Distinct, outline attached Exudate Amount: Medium Exudate Type: Serosanguineous Exudate Color: red, brown t Structures Foul Odor After Cleansing: No Slough/Fibrino Yes Wound Bed Granulation Amount: Large (67-100%) Exposed Structure Granulation Quality: Red Fascia Exposed: No Necrotic Amount: Small (1-33%) Fat Layer (Subcutaneous Tissue) Exposed: Yes Necrotic Quality: Adherent Slough Tendon Exposed: No Muscle Exposed: No [name], [name] ([id number]) [id number].pdf Page 8 of 10 Joint Exposed: No Bone Exposed: No Periwound Skin Texture Texture Color No Abnormalities Noted: No No Abnormalities Noted: No Callus: No Atrophie Blanche: No Crepitus: No Cyanosis: No Excoriation: No Ecchymosis: No Induration: No Erythema: No Rash: No Hemosiderin Staining: No Scarring: No Mottled: No Pallor: No Moisture Rubor: No No Abnormalities Noted: No Dry / Scaly: No Maceration: No Treatment Notes Wound #11 (Knee) Wound Laterality: Left Cleanser Soap and Water  Discharge Instruction: May shower and wash wound with dial antibacterial soap and water  prior to dressing change. Vashe 5.8 (oz) Discharge Instruction: Cleanse the wound with Vashe prior to applying a clean dressing using gauze sponges, not tissue or cotton balls. Peri-Wound  Care Skin Prep Discharge Instruction: Use skin prep as directed Topical Primary Dressing Maxorb Extra Ag+ Alginate Dressing, 2x2 (in/in) Discharge Instruction: Apply to wound bed as instructed Secondary Dressing Zetuvit Plus Silicone Border Dressing 3x3 (in/in) Discharge Instruction: Apply silicone border over primary dressing as directed. Secured With Compression Wrap Compression Stockings Facilities Manager) Signed: 08/21/2023 3:11:11 PM By: Cammie Sailors RN, BSN Previous Signature: 08/21/2023 1:50:33 PM Version By: [name] Entered By: Cammie [name] on 08/21/2023 11:15:16 -------------------------------------------------------------------------------- Wound Assessment Details Patient Name: Date of Service: [name], [name] 08/21/2023 1:15 PM Medical Record Number: [id number] Patient Account Number: [id number] Date of Birth/Sex: Treating RN: 09-20-1931 (89 y.o. [name] Primary Care Haleemah Buckalew: [name] Other Clinician: Referring Vassie Kugel: Treating Dealva Lafoy/Extender: [name] [facility], [name] in Treatment: 4 Wound Status Wound Number: 9 Primary Pressure Ulcer Etiology: Wound Location: Right Gluteus Wound Open Wounding Event: Pressure  Injury Status: Date Acquired: 07/29/2023 CAYMEN, DUBRAY ([id number]) [phone].pdf Page 9 of 10 Date Acquired: 07/29/2023 Comorbid Cataracts, Congestive Heart Failure, Coronary Artery Disease, [name] Of Treatment: 2 History: Hypertension, Peripheral Venous Disease, Osteoarthritis, Clustered Wound: No Neuropathy Photos Wound Measurements Length: (cm) 0.3 Width: (cm) 0.3 Depth: (cm) 0.1 Area: (cm) 0.071 Volume: (cm) 0.007 % Reduction in Area: 91% % Reduction in Volume: 91.1% Epithelialization: Large (67-100%) Tunneling: No Undermining: No Wound Description Classification: Category/Stage III Wound Margin: Flat and Intact Exudate Amount: Small Exudate Type:  Serosanguineous Exudate Color: red, brown Foul Odor After Cleansing: No Slough/Fibrino Yes Wound Bed Granulation Amount: Small (1-33%) Exposed Structure Granulation Quality: Red Fascia Exposed: No Necrotic Amount: Large (67-100%) Fat Layer (Subcutaneous Tissue) Exposed: Yes Tendon Exposed: No Muscle Exposed: No Joint Exposed: No Bone Exposed: No Periwound Skin Texture Texture Color No Abnormalities Noted: Yes No Abnormalities Noted: Yes Moisture Temperature / Pain No Abnormalities Noted: Yes Temperature: No Abnormality Tenderness on Palpation: Yes Treatment Notes Wound #9 (Gluteus) Wound Laterality: Right Cleanser Soap and Water  Discharge Instruction: May shower and wash wound with dial antibacterial soap and water  prior to dressing change. Vashe 5.8 (oz) Discharge Instruction: Cleanse the wound with Vashe prior to applying a clean dressing using gauze sponges, not tissue or cotton balls. Peri-Wound Care Skin Prep Discharge Instruction: Use skin prep as directed Topical Primary Dressing Maxorb Extra Ag+ Alginate Dressing, 2x2 (in/in) Discharge Instruction: Apply to wound bed as instructed Secondary Dressing Zetuvit Plus Silicone Border Dressing 3x3 (in/in) Discharge Instruction: Apply silicone border over primary dressing as directed. Secured With EMIEL, KIELTY ([id number]) [id number] Page 10 of 10 Compression Wrap Compression Stockings Add-Ons Electronic Signature(s) Signed: 08/21/2023 3:11:11 PM By: Cammie Sailors RN, BSN Previous Signature: 08/21/2023 1:50:33 PM Version By: [name] Entered By: Cammie [name] on 08/21/2023 11:15:45 -------------------------------------------------------------------------------- Vitals Details Patient Name: Date of Service: [name] [name], Leontae [name]. 08/21/2023 1:15 PM Medical Record Number: [id number] Patient Account Number: [id number] Date of Birth/Sex: Treating RN: 28-Aug-1931 (89 y.o. M) Primary Care  Dara Camargo: [name] Other Clinician: Referring Skyeler Smola: Treating Timur Nibert/Extender: [name] [facility], [name] in Treatment: 4 Vital Signs Time Taken: 01:27 Temperature (F): 98.2 Pulse (bpm): 60 Respiratory Rate (breaths/min): 18 Blood Pressure (mmHg): 141/74 Reference Range: 80 - 120 mg / dl Electronic Signature(s) Signed: 08/21/2023 1:50:33 PM By: [name] Entered By: [name] on 08/21/2023 10:27:59

## 2023-08-21 NOTE — Progress Notes (Signed)
 [name], [name] ([id number]) [id number] Page 1 of 9 Visit Report for 08/21/2023 Chief Complaint Document Details Patient Name: Date of Service: [place] 08/21/2023 1:15 PM Medical Record Number: [id number] Patient Account Number: [id number] Date of Birth/Sex: Treating RN: [birth year] (89 y.o. M) Primary Care Provider: ILEEN BROWNING Other Clinician: Referring Provider: Treating Provider/Extender: Marolyn Nest [facility], MICHELLE Weeks in Treatment: 4 Information Obtained from: Patient Chief Complaint Patient presents for treatment of an open ulcer due to venous insufficiency 07/24/2023; patient arrives in clinic with circumferential blistering and skin breakdown in the right lower leg Electronic Signature(s) Signed: 08/21/2023 3:08:54 PM By: Marolyn Nest MD FACS Entered By: Marolyn Nest on 08/21/2023 15:08:54 -------------------------------------------------------------------------------- Debridement Details Patient Name: Date of Service: [name], [name] 08/21/2023 1:15 PM Medical Record Number: [id number] Patient Account Number: [id number] Date of Birth/Sex: Treating RN: 1932-01-11 (89 y.o. NETTY Cammie Sailors Primary Care Provider: ILEEN BROWNING Other Clinician: Referring Provider: Treating Provider/Extender: Marolyn Nest [facility], MICHELLE Weeks in Treatment: 4 Debridement Performed for Assessment: Wound #11 Left Knee Performed By: Physician Marolyn Nest, MD The following information was scribed by: Cammie Sailors The information was scribed for: Marolyn Nest Debridement Type: Debridement Level of Consciousness (Pre-procedure): Awake and Alert Pre-procedure Verification/Time Out Yes - 14:15 Taken: Start Time: 14:15 Percent of Wound Bed Debrided: 100% T Area Debrided (cm): otal 0.2 Tissue and other material debrided: Non-Viable, Slough, Subcutaneous, Slough Level: Skin/Subcutaneous Tissue Debridement  Description: Excisional Instrument: Curette Bleeding: Minimum Hemostasis Achieved: Pressure Response to Treatment: Procedure was tolerated well Level of Consciousness (Post- Awake and Alert procedure): Post Debridement Measurements of Total Wound Length: (cm) 0.5 Width: (cm) 0.5 Depth: (cm) 0.1 Volume: (cm) 0.02 Character of Wound/Ulcer Post Debridement: Improved Post Procedure Diagnosis [name], [name] ([id number]) [id number] Page 2 of 9 Same as Pre-procedure Electronic Signature(s) Signed: 08/21/2023 3:11:11 PM By: Cammie Sailors RN, BSN Signed: 08/21/2023 3:28:53 PM By: Marolyn Nest MD FACS Entered By: Cammie Sailors on 08/21/2023 15:01:14 -------------------------------------------------------------------------------- Debridement Details Patient Name: Date of Service: [name], [name] 08/21/2023 1:15 PM Medical Record Number: [id number] Patient Account Number: [id number] Date of Birth/Sex: Treating RN: [birth year] (89 y.o. NETTY Cammie Sailors Primary Care Provider: ILEEN BROWNING Other Clinician: Referring Provider: Treating Provider/Extender: Marolyn Nest [facility], MICHELLE Weeks in Treatment: 4 Debridement Performed for Assessment: Wound #9 Right Gluteus Performed By: Physician Marolyn Nest, MD Debridement Type: Debridement Level of Consciousness (Pre-procedure): Awake and Alert Pre-procedure Verification/Time Out Yes - 14:15 Taken: Start Time: 14:15 Percent of Wound Bed Debrided: 100% T Area Debrided (cm): otal 0.07 Tissue and other material debrided: Non-Viable, Slough, Subcutaneous, Slough Level: Skin/Subcutaneous Tissue Debridement Description: Excisional Instrument: Curette Bleeding: Minimum Hemostasis Achieved: Pressure Response to Treatment: Procedure was tolerated well Level of Consciousness (Post- Awake and Alert procedure): Post Debridement Measurements of Total Wound Length: (cm) 0.3 Stage: Category/Stage  III Width: (cm) 0.3 Depth: (cm) 0.1 Volume: (cm) 0.007 Character of Wound/Ulcer Post Debridement: Improved Post Procedure Diagnosis Same as Pre-procedure Electronic Signature(s) Signed: 08/21/2023 3:11:11 PM By: Cammie Sailors RN, BSN Signed: 08/21/2023 3:28:53 PM By: Marolyn Nest MD FACS Entered By: Cammie Sailors on 08/21/2023 15:01:46 -------------------------------------------------------------------------------- HPI Details Patient Name: Date of Service: [name], [name] 08/21/2023 1:15 PM Medical Record Number: [id number] Patient Account Number: [id number] Date of Birth/Sex: Treating RN: 06-24-1932 (89 y.o. M) Primary Care Provider: ILEEN BROWNING Other Clinician: Referring Provider: Treating Provider/Extender: Marolyn Nest [facility], MICHELLE Weeks in Treatment: 4 [name], [name] ([id number]) [id number] Page 3 of 9 History of Present Illness  HPI Description: ADMISSION 01/11/2023 This is an 89 year old nondiabetic with a history of chronic venous insufficiency, coronary artery disease, congestive heart failure, and lower extremity edema. He has historically been followed by podiatry for various ulcers and calluses on his feet. When he was seen in the middle of April, he was noted to have an ulcer starting on his left ankle. He was prescribed a course of cephalexin  and advised to apply Betadine  to the wound with an Ace wrap. He was referred to the wound care center for further evaluation and management. Apparently the wound closed of its own accord prior to his appointment with us , but subsequently reopened. The notes from podiatry do indicate that compression and elevation were discussed as means to help heal ulceration and avoid future episodes. After the wound reopened, he was seen again and prescribed a course of Augmentin . He is now here for his wound care appointment. 01/21/2023: The wound is smaller and more superficial. It is fairly clean  with just a little bit of light slough on the surface. His venous reflux studies are scheduled for this coming Thursday. 01/29/2023: The wound is smaller again today and very superficial. There is minimal slough present with a little eschar around the edges. His venous reflux studies were completed, and as expected, he has severe venous reflux. 02/07/2023: His wound is very nearly closed. He has an appointment coming up with vascular surgery on July 8. He indicated a callus on the bottom of his left foot that is bothering him today. 02/15/2023: There is just a pinhole opening remaining on the left, but unfortunately, he has had a new ulceration occur on the medial aspect of his right lower leg. The fat layer is exposed. He does have 2+ pitting edema in this leg. He says he noted fluid coming from the site a couple of days ago. He does not wear compression garments due to discomfort with stockings, but he has never received juxta lite or Farrow wrap garments. 02/22/2023: His left leg has healed. On the right, the open area has contracted considerably. There is some slough and eschar present. He did meet with vascular surgery for his initial consultation and will return in 3 months to discuss saphenous vein ablation. 03/04/2023: His wraps slid down bilaterally and he is leaking clear fluid from the left leg. There is slough build up on the right lower leg wound, but the overall size is smaller. 03/11/2023: The area on his left leg where he was leaking fluid has closed. The right lower leg wound is smaller and clean. He does have his Farrow wraps with him today. 03/27/2023: At his nurse visit last week, his left medial ankle was found to be open. He has another new wound today on the left anterior tibial surface. The right ankle ulcer is smaller today. Everything is very superficial and clean. 04/04/2023: Despite being in bilateral 3 layer compression equivalent, he has developed new wounds. All of these seem  to be secondary to blistering. He has a new wound on his right lateral lower leg just above the ankle and 2 new sites on his left anterolateral lower leg. These are fairly tender and a the skin is little bit erythematous around them. 04/18/2023: His wounds are healed. READMISSION 07/24/2023 This is an 89 year old man we had in clinic from 01/11/2023 through 04/18/2023 with bilateral lower leg wounds felt to be secondary to bilateral chronic venous insufficiency. Eventually he healed up. He was discharged with Aundra wraps and he had a  vein and vascular follow-up. Indeed he did see Dr. Serene on 06/03/2023 and it was felt that he would be a candidate for right saphenous vein ablations. Shortly thereafter the patient reports that he developed rapid increase in edema in his legs right greater than left. More recently there was blistering and open wounds in the right posterior and anterior ankle area. He now has open wounds in this area. In discussion with him it is quite clear he was not wearing the Farrow wraps. I am not certain if it was an issue with him not being able to get them on himself or simply disinterested. He says at 1 point my legs were not swollen so I did not put them on. 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 The patient has a history of pulmonary embolism, spinal stenosis and chronic venous insufficiency. I do not think he is felt to have a significant arterial issue. 08/13/2023: He has a new stage III pressure ulcer on his right buttock. There is slough on the surface. He admits to sitting stationary for prolonged periods of time without shifting his weight. He has a traumatic new wound to his left knee with slough and eschar buildup on the surface. The circumferential right leg wound has healed, but he has a new blister on his right lateral lower leg that has opened. The surface is very clean. He also pointed out a lesion on his left forearm that he  says has been bleeding off and on. It is fleshy and somewhat fungating in appearance. 08/21/2023: All of the wounds on his right leg are healed. The wound on his left knee has a little bit of slough present but measures smaller today. The pressure ulcer on his buttock is smaller today with some slough on the surface again. The lesion that I removed from his forearm returned as a pyogenic granuloma and no further treatment is indicated. Electronic Signature(s) Signed: 08/21/2023 3:09:45 PM By: Marolyn Nest MD FACS Entered By: Marolyn Nest on 08/21/2023 15:09:45 -------------------------------------------------------------------------------- Physical Exam Details Patient Name: Date of Service: [name], [name] 08/21/2023 1:15 PM Medical Record Number: [id number] Patient Account Number: [id number] Date of Birth/Sex: Treating RN: 10-10-1931 (89 y.o. M) Primary Care Provider: ILEEN BROWNING Other Clinician: JALON, SQUIER ([id number]) [id number] Page 4 of 9 Referring Provider: Treating Provider/Extender: Marolyn Nest [facility], MICHELLE Weeks in Treatment: 4 Constitutional Slightly hypertensive. . . . no acute distress. Respiratory Normal work of breathing on room air.. Notes 08/21/2023: All of the wounds on his right leg are healed. The wound on his left knee has a little bit of slough present but measures smaller today. The pressure ulcer on his buttock is smaller today with some slough on the surface again. Electronic Signature(s) Signed: 08/21/2023 3:11:41 PM By: Marolyn Nest MD FACS Entered By: Marolyn Nest on 08/21/2023 15:11:40 -------------------------------------------------------------------------------- Physician Orders Details Patient Name: Date of Service: [name], [name] 08/21/2023 1:15 PM Medical Record Number: [id number] Patient Account Number: [id number] Date of Birth/Sex: Treating RN: [birth year] (89 y.o. NETTY Cammie Sailors Primary Care Provider: ILEEN BROWNING Other Clinician: Referring Provider: Treating Provider/Extender: Marolyn Nest [facility], MICHELLE Weeks in Treatment: 4 Verbal / Phone Orders: No Diagnosis Coding ICD-10 Coding Code Description L89.313 Pressure ulcer of right buttock, stage 3 L98.8 Other specified disorders of the skin and subcutaneous tissue L97.829 Non-pressure chronic ulcer of other part of left lower leg with unspecified severity I87.333 Chronic venous hypertension (idiopathic) with ulcer  and inflammation of bilateral lower extremity I89.0 Lymphedema, not elsewhere classified Follow-up Appointments ppointment in 1 week. - Dr. Marolyn - 08/27/23 @ 2:15 Return A ppointment in 2 weeks. - Dr. Marolyn (front office to schedule) Return A Return appointment in 3 weeks. - Dr. Marolyn (front office to schedule) Other: - Will call you with biopsy results once it returns. Anesthetic (In clinic) Topical Lidocaine  4% applied to wound bed (In clinic) Xylocaine  1% injection applied to wound bed - to left forearm. Bathing/ Shower/ Hygiene May shower with protection but do not get wound dressing(s) wet. Protect dressing(s) with water  repellant cover (for example, large plastic bag) or a cast cover and may then take shower. Edema Control - Orders / Instructions Elevate legs to the level of the heart or above for 30 minutes daily and/or when sitting for 3-4 times a day throughout the day. Avoid standing for long periods of time. Other Edema Control Orders/Instructions: - left leg apply urgo k2 lite and lotion. Lymphedema Treatment Plan - Exercise, Compression and Elevation Elevate legs 30 - 60 minutes at or above heart level at least 3 - 4 times daily as able/tolerated Avoid standing for long periods and elevate leg(s) parallel to the floor when sitting Wound Treatment Wound #11 - Knee Wound Laterality: Left Cleanser: Soap and Water  Every Other Day/30 Days Discharge Instructions:  May shower and wash wound with dial antibacterial soap and water  prior to dressing change. Cleanser: Vashe 5.8 (oz) Every Other Day/30 Days [name], [name] ([id number]) [id number].pdf Page 5 of 9 Discharge Instructions: Cleanse the wound with Vashe prior to applying a clean dressing using gauze sponges, not tissue or cotton balls. Peri-Wound Care: Skin Prep (Generic) Every Other Day/30 Days Discharge Instructions: Use skin prep as directed Prim Dressing: Maxorb Extra Ag+ Alginate Dressing, 2x2 (in/in) (Generic) Every Other Day/30 Days ary Discharge Instructions: Apply to wound bed as instructed Secondary Dressing: Zetuvit Plus Silicone Border Dressing 3x3 (in/in) (Generic) Every Other Day/30 Days Discharge Instructions: Apply silicone border over primary dressing as directed. Wound #9 - Gluteus Wound Laterality: Right Cleanser: Soap and Water  Every Other Day/30 Days Discharge Instructions: May shower and wash wound with dial antibacterial soap and water  prior to dressing change. Cleanser: Vashe 5.8 (oz) Every Other Day/30 Days Discharge Instructions: Cleanse the wound with Vashe prior to applying a clean dressing using gauze sponges, not tissue or cotton balls. Peri-Wound Care: Skin Prep (Generic) Every Other Day/30 Days Discharge Instructions: Use skin prep as directed Prim Dressing: Maxorb Extra Ag+ Alginate Dressing, 2x2 (in/in) (Generic) Every Other Day/30 Days ary Discharge Instructions: Apply to wound bed as instructed Secondary Dressing: Zetuvit Plus Silicone Border Dressing 3x3 (in/in) (Generic) Every Other Day/30 Days Discharge Instructions: Apply silicone border over primary dressing as directed. Electronic Signature(s) Signed: 08/21/2023 3:28:53 PM By: Marolyn Nest MD FACS Previous Signature: 08/21/2023 3:11:11 PM Version By: Cammie Sailors RN, BSN Entered By: Marolyn Nest on 08/21/2023  15:12:23 -------------------------------------------------------------------------------- Problem List Details Patient Name: Date of Service: [name], Shadman [name]. 08/21/2023 1:15 PM Medical Record Number: [id number] Patient Account Number: [id number] Date of Birth/Sex: Treating RN: 1932-06-02 (89 y.o. M) Primary Care Provider: ILEEN BROWNING Other Clinician: Referring Provider: Treating Provider/Extender: Marolyn Nest [facility], MICHELLE Weeks in Treatment: 4 Active Problems ICD-10 Encounter Code Description Active Date MDM Diagnosis L89.313 Pressure ulcer of right buttock, stage 3 08/13/2023 No Yes L98.8 Other specified disorders of the skin and subcutaneous tissue 08/13/2023 No Yes L97.829 Non-pressure chronic ulcer of other part of left lower leg  with unspecified 08/13/2023 No Yes severity I87.333 Chronic venous hypertension (idiopathic) with ulcer and inflammation of 07/24/2023 No Yes bilateral lower extremity I89.0 Lymphedema, not elsewhere classified 07/24/2023 No Yes GRAYSIN, LUCZYNSKI [name] ([id number]) [phone].pdf Page 6 of 9 Inactive Problems ICD-10 Code Description Active Date Inactive Date L97.818 Non-pressure chronic ulcer of other part of right lower leg with other specified severity 07/24/2023 07/24/2023 Resolved Problems Electronic Signature(s) Signed: 08/21/2023 3:06:15 PM By: Marolyn Nest MD FACS Previous Signature: 08/21/2023 3:04:47 PM Version By: Marolyn Nest MD FACS Previous Signature: 08/21/2023 2:30:13 PM Version By: Marolyn Nest MD FACS Entered By: Marolyn Nest on 08/21/2023 15:06:15 -------------------------------------------------------------------------------- Progress Note Details Patient Name: Date of Service: [name], Azarias [name]. 08/21/2023 1:15 PM Medical Record Number: [id number] Patient Account Number: [id number] Date of Birth/Sex: Treating RN: 1931-12-09 (89 y.o. M) Primary Care Provider: ILEEN BROWNING Other  Clinician: Referring Provider: Treating Provider/Extender: Marolyn Nest [facility], MICHELLE Weeks in Treatment: 4 Subjective Chief Complaint Information obtained from Patient Patient presents for treatment of an open ulcer due to venous insufficiency 07/24/2023; patient arrives in clinic with circumferential blistering and skin breakdown in the right lower leg History of Present Illness (HPI) ADMISSION 01/11/2023 This is an 89 year old nondiabetic with a history of chronic venous insufficiency, coronary artery disease, congestive heart failure, and lower extremity edema. He has historically been followed by podiatry for various ulcers and calluses on his feet. When he was seen in the middle of April, he was noted to have an ulcer starting on his left ankle. He was prescribed a course of cephalexin  and advised to apply Betadine  to the wound with an Ace wrap. He was referred to the wound care center for further evaluation and management. Apparently the wound closed of its own accord prior to his appointment with us , but subsequently reopened. The notes from podiatry do indicate that compression and elevation were discussed as means to help heal ulceration and avoid future episodes. After the wound reopened, he was seen again and prescribed a course of Augmentin . He is now here for his wound care appointment. 01/21/2023: The wound is smaller and more superficial. It is fairly clean with just a little bit of light slough on the surface. His venous reflux studies are scheduled for this coming Thursday. 01/29/2023: The wound is smaller again today and very superficial. There is minimal slough present with a little eschar around the edges. His venous reflux studies were completed, and as expected, he has severe venous reflux. 02/07/2023: His wound is very nearly closed. He has an appointment coming up with vascular surgery on July 8. He indicated a callus on the bottom of his left foot that is  bothering him today. 02/15/2023: There is just a pinhole opening remaining on the left, but unfortunately, he has had a new ulceration occur on the medial aspect of his right lower leg. The fat layer is exposed. He does have 2+ pitting edema in this leg. He says he noted fluid coming from the site a couple of days ago. He does not wear compression garments due to discomfort with stockings, but he has never received juxta lite or Farrow wrap garments. 02/22/2023: His left leg has healed. On the right, the open area has contracted considerably. There is some slough and eschar present. He did meet with vascular surgery for his initial consultation and will return in 3 months to discuss saphenous vein ablation. 03/04/2023: His wraps slid down bilaterally and he is leaking clear fluid from the left leg. There is  slough build up on the right lower leg wound, but the overall size is smaller. 03/11/2023: The area on his left leg where he was leaking fluid has closed. The right lower leg wound is smaller and clean. He does have his Farrow wraps with him today. 03/27/2023: At his nurse visit last week, his left medial ankle was found to be open. He has another new wound today on the left anterior tibial surface. The right ankle ulcer is smaller today. Everything is very superficial and clean. 04/04/2023: Despite being in bilateral 3 layer compression equivalent, he has developed new wounds. All of these seem to be secondary to blistering. He has a new wound on his right lateral lower leg just above the ankle and 2 new sites on his left anterolateral lower leg. These are fairly tender and a the skin is little bit erythematous around them. [name], [name] ([id number]) [id number] Page 7 of 9 04/18/2023: His wounds are healed. READMISSION 07/24/2023 This is an 89 year old man we had in clinic from 01/11/2023 through 04/18/2023 with bilateral lower leg wounds felt to be secondary to bilateral  chronic venous insufficiency. Eventually he healed up. He was discharged with Aundra wraps and he had a vein and vascular follow-up. Indeed he did see Dr. Serene on 06/03/2023 and it was felt that he would be a candidate for right saphenous vein ablations. Shortly thereafter the patient reports that he developed rapid increase in edema in his legs right greater than left. More recently there was blistering and open wounds in the right posterior and anterior ankle area. He now has open wounds in this area. In discussion with him it is quite clear he was not wearing the Farrow wraps. I am not certain if it was an issue with him not being able to get them on himself or simply disinterested. He says at 1 point my legs were not swollen so I did not put them on. 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 The patient has a history of pulmonary embolism, spinal stenosis and chronic venous insufficiency. I do not think he is felt to have a significant arterial issue. 08/13/2023: He has a new stage III pressure ulcer on his right buttock. There is slough on the surface. He admits to sitting stationary for prolonged periods of time without shifting his weight. He has a traumatic new wound to his left knee with slough and eschar buildup on the surface. The circumferential right leg wound has healed, but he has a new blister on his right lateral lower leg that has opened. The surface is very clean. He also pointed out a lesion on his left forearm that he says has been bleeding off and on. It is fleshy and somewhat fungating in appearance. 08/21/2023: All of the wounds on his right leg are healed. The wound on his left knee has a little bit of slough present but measures smaller today. The pressure ulcer on his buttock is smaller today with some slough on the surface again. The lesion that I removed from his forearm returned as a pyogenic granuloma and no further treatment is  indicated. Objective Constitutional Slightly hypertensive. no acute distress. Vitals Time Taken: 1:27 AM, Temperature: 98.2 F, Pulse: 60 bpm, Respiratory Rate: 18 breaths/min, Blood Pressure: 141/74 mmHg. Respiratory Normal work of breathing on room air.. General Notes: 08/21/2023: All of the wounds on his right leg are healed. The wound on his left knee has a little bit of  slough present but measures smaller today. The pressure ulcer on his buttock is smaller today with some slough on the surface again. Integumentary (Hair, Skin) Wound #10 status is Healed - Epithelialized. Original cause of wound was Gradually Appeared. The date acquired was: 08/13/2023. The wound has been in treatment 1 weeks. The wound is located on the Right,Lateral Lower Leg. The wound measures 0cm length x 0cm width x 0cm depth; 0cm^2 area and 0cm^3 volume. There is no tunneling or undermining noted. There is a none present amount of drainage noted. The wound margin is distinct with the outline attached to the wound base. There is no granulation within the wound bed. There is no necrotic tissue within the wound bed. The periwound skin appearance exhibited: Hemosiderin Staining. The periwound skin appearance did not exhibit: Callus, Crepitus, Excoriation, Induration, Rash, Scarring, Dry/Scaly, Maceration, Atrophie Blanche, Cyanosis, Ecchymosis, Mottled, Pallor, Rubor, Erythema. Periwound temperature was noted as No Abnormality. Wound #11 status is Open. Original cause of wound was Trauma. The date acquired was: 08/11/2023. The wound has been in treatment 1 weeks. The wound is located on the Left Knee. The wound measures 0.5cm length x 0.5cm width x 0.1cm depth; 0.196cm^2 area and 0.02cm^3 volume. There is Fat Layer (Subcutaneous Tissue) exposed. There is no tunneling or undermining noted. There is a medium amount of serosanguineous drainage noted. The wound margin is distinct with the outline attached to the wound base. There  is large (67-100%) red granulation within the wound bed. There is a small (1-33%) amount of necrotic tissue within the wound bed including Adherent Slough. The periwound skin appearance did not exhibit: Callus, Crepitus, Excoriation, Induration, Rash, Scarring, Dry/Scaly, Maceration, Atrophie Blanche, Cyanosis, Ecchymosis, Hemosiderin Staining, Mottled, Pallor, Rubor, Erythema. Wound #9 status is Open. Original cause of wound was Pressure Injury. The date acquired was: 07/29/2023. The wound has been in treatment 2 weeks. The wound is located on the Right Gluteus. The wound measures 0.3cm length x 0.3cm width x 0.1cm depth; 0.071cm^2 area and 0.007cm^3 volume. There is Fat Layer (Subcutaneous Tissue) exposed. There is no tunneling or undermining noted. There is a small amount of serosanguineous drainage noted. The wound margin is flat and intact. There is small (1-33%) red granulation within the wound bed. There is a large (67-100%) amount of necrotic tissue within the wound bed. The periwound skin appearance had no abnormalities noted for texture. The periwound skin appearance had no abnormalities noted for moisture. The periwound skin appearance had no abnormalities noted for color. Periwound temperature was noted as No Abnormality. The periwound has tenderness on palpation. Assessment Active Problems ICD-10 Pressure ulcer of right buttock, stage 3 Other specified disorders of the skin and subcutaneous tissue Non-pressure chronic ulcer of other part of left lower leg with unspecified severity Chronic venous hypertension (idiopathic) with ulcer and inflammation of bilateral lower extremity Lymphedema, not elsewhere classified [name], [name] ([id number]) [id number] Page 8 of 9 Procedures Wound #11 Pre-procedure diagnosis of Wound #11 is a Trauma, Other located on the Left Knee . There was a Excisional Skin/Subcutaneous Tissue Debridement with a total area of 0.2 sq  cm performed by Marolyn Nest, MD. With the following instrument(s): Curette to remove Non-Viable tissue/material. Material removed includes Subcutaneous Tissue and Slough and. No specimens were taken. A time out was conducted at 14:15, prior to the start of the procedure. A Minimum amount of bleeding was controlled with Pressure. The procedure was tolerated well. Post Debridement Measurements: 0.5cm length x 0.5cm width x 0.1cm depth; 0.02cm^3 volume.  Character of Wound/Ulcer Post Debridement is improved. Post procedure Diagnosis Wound #11: Same as Pre-Procedure Wound #9 Pre-procedure diagnosis of Wound #9 is a Pressure Ulcer located on the Right Gluteus . There was a Excisional Skin/Subcutaneous Tissue Debridement with a total area of 0.07 sq cm performed by Marolyn Nest, MD. With the following instrument(s): Curette to remove Non-Viable tissue/material. Material removed includes Subcutaneous Tissue and Slough and. No specimens were taken. A time out was conducted at 14:15, prior to the start of the procedure. A Minimum amount of bleeding was controlled with Pressure. The procedure was tolerated well. Post Debridement Measurements: 0.3cm length x 0.3cm width x 0.1cm depth; 0.007cm^3 volume. Post debridement Stage noted as Category/Stage III. Character of Wound/Ulcer Post Debridement is improved. Post procedure Diagnosis Wound #9: Same as Pre-Procedure Plan Follow-up Appointments: Return Appointment in 1 week. - Dr. Marolyn - 08/27/23 @ 2:15 Return Appointment in 2 weeks. - Dr. Marolyn (front office to schedule) Return appointment in 3 weeks. - Dr. Marolyn (front office to schedule) Other: - Will call you with biopsy results once it returns. Anesthetic: (In clinic) Topical Lidocaine  4% applied to wound bed (In clinic) Xylocaine  1% injection applied to wound bed - to left forearm. Bathing/ Shower/ Hygiene: May shower with protection but do not get wound dressing(s) wet. Protect dressing(s)  with water  repellant cover (for example, large plastic bag) or a cast cover and may then take shower. Edema Control - Orders / Instructions: Elevate legs to the level of the heart or above for 30 minutes daily and/or when sitting for 3-4 times a day throughout the day. Avoid standing for long periods of time. Other Edema Control Orders/Instructions: - left leg apply urgo k2 lite and lotion. Lymphedema Treatment Plan - Exercise, Compression and Elevation: Elevate legs 30 - 60 minutes at or above heart level at least 3 - 4 times daily as able/tolerated Avoid standing for long periods and elevate leg(s) parallel to the floor when sitting WOUND #11: - Knee Wound Laterality: Left Cleanser: Soap and Water  Every Other Day/30 Days Discharge Instructions: May shower and wash wound with dial antibacterial soap and water  prior to dressing change. Cleanser: Vashe 5.8 (oz) Every Other Day/30 Days Discharge Instructions: Cleanse the wound with Vashe prior to applying a clean dressing using gauze sponges, not tissue or cotton balls. Peri-Wound Care: Skin Prep (Generic) Every Other Day/30 Days Discharge Instructions: Use skin prep as directed Prim Dressing: Maxorb Extra Ag+ Alginate Dressing, 2x2 (in/in) (Generic) Every Other Day/30 Days ary Discharge Instructions: Apply to wound bed as instructed Secondary Dressing: Zetuvit Plus Silicone Border Dressing 3x3 (in/in) (Generic) Every Other Day/30 Days Discharge Instructions: Apply silicone border over primary dressing as directed. WOUND #9: - Gluteus Wound Laterality: Right Cleanser: Soap and Water  Every Other Day/30 Days Discharge Instructions: May shower and wash wound with dial antibacterial soap and water  prior to dressing change. Cleanser: Vashe 5.8 (oz) Every Other Day/30 Days Discharge Instructions: Cleanse the wound with Vashe prior to applying a clean dressing using gauze sponges, not tissue or cotton balls. Peri-Wound Care: Skin Prep (Generic)  Every Other Day/30 Days Discharge Instructions: Use skin prep as directed Prim Dressing: Maxorb Extra Ag+ Alginate Dressing, 2x2 (in/in) (Generic) Every Other Day/30 Days ary Discharge Instructions: Apply to wound bed as instructed Secondary Dressing: Zetuvit Plus Silicone Border Dressing 3x3 (in/in) (Generic) Every Other Day/30 Days Discharge Instructions: Apply silicone border over primary dressing as directed. 08/21/2023: All of the wounds on his right leg are healed. The wound on  his left knee has a little bit of slough present but measures smaller today. The pressure ulcer on his buttock is smaller today with some slough on the surface again. The lesion that I removed from his forearm returned as a pyogenic granuloma and no further treatment is indicated. I used a curette to debride slough and subcutaneous tissue from the knee and buttock ulcers. We will continue silver alginate with foam border dressings to each site. His legs are healed, but he needs to wear compression garments of some sort in order to prevent reopening his wounds. He seems reluctant to do so but I know that he does have compression stockings. He is very interested in pursuing saphenous vein ablation. He previously saw Dr. Serene who also recommended the procedure. I told Mr. Bellanca to call Dr. Russ office to set up an appointment. He will follow-up here in 1 week. Electronic Signature(s) [name], [name] ([id number]) [id number] Page 9 of 9 Signed: 08/21/2023 3:13:43 PM By: Marolyn Nest MD FACS Entered By: Marolyn Nest on 08/21/2023 15:13:43 -------------------------------------------------------------------------------- SuperBill Details Patient Name: Date of Service: [name], Trashawn [name]. 08/21/2023 Medical Record Number: [id number] Patient Account Number: [id number] Date of Birth/Sex: Treating RN: 07-16-1932 (89 y.o. M) Primary Care Provider: ILEEN BROWNING Other  Clinician: Referring Provider: Treating Provider/Extender: Marolyn Nest [facility], [facility] Weeks in Treatment: 4 Diagnosis Coding ICD-10 Codes Code Description L89.313 Pressure ulcer of right buttock, stage 3 L98.8 Other specified disorders of the skin and subcutaneous tissue L97.829 Non-pressure chronic ulcer of other part of left lower leg with unspecified severity I87.333 Chronic venous hypertension (idiopathic) with ulcer and inflammation of bilateral lower extremity I89.0 Lymphedema, not elsewhere classified Facility Procedures : CPT4 Code: [id number] Description: 11042 - DEB SUBQ TISSUE 20 SQ CM/< ICD-10 Diagnosis Description L89.313 Pressure ulcer of right buttock, stage 3 L97.829 Non-pressure chronic ulcer of other part of left lower leg with unspecified seve I87.333 Chronic venous hypertension  (idiopathic) with ulcer and inflammation of bilatera I89.0 Lymphedema, not elsewhere classified Modifier: rity l lower extremity Quantity: 1 Physician Procedures : CPT4 Code Description Modifier [id number] 11042 - WC PHYS SUBQ TISS 20 SQ CM ICD-10 Diagnosis Description L89.313 Pressure ulcer of right buttock, stage 3 L97.829 Non-pressure chronic ulcer of other part of left lower leg with unspecified severity I87.333  Chronic venous hypertension (idiopathic) with ulcer and inflammation of bilateral lower extremity I89.0 Lymphedema, not elsewhere classified Quantity: 1 Electronic Signature(s) Signed: 08/21/2023 3:14:32 PM By: Marolyn Nest MD FACS Entered By: Marolyn Nest on 08/21/2023 15:14:32

## 2023-08-27 ENCOUNTER — Encounter (HOSPITAL_BASED_OUTPATIENT_CLINIC_OR_DEPARTMENT_OTHER): Payer: Medicare Other | Admitting: General Surgery

## 2023-08-27 DIAGNOSIS — L97829 Non-pressure chronic ulcer of other part of left lower leg with unspecified severity: Secondary | ICD-10-CM | POA: Diagnosis not present

## 2023-08-29 NOTE — Progress Notes (Signed)
 [name], [name] ([id number]) [id number] Page 1 of 9 Visit Report for 08/27/2023 Chief Complaint Document Details Patient Name: Date of Service: [place] 08/27/2023 2:15 PM Medical Record Number: [id number] Patient Account Number: [id number] Date of Birth/Sex: Treating RN: [birth year] (89 y.o. M) Primary Care Provider: ILEEN BROWNING Other Clinician: Referring Provider: Treating Provider/Extender: Marolyn Nest [facility], [place]LE Weeks in Treatment: 4 Information Obtained from: Patient Chief Complaint Patient presents for treatment of an open ulcer due to venous insufficiency 07/24/2023; patient arrives in clinic with circumferential blistering and skin breakdown in the right lower leg Electronic Signature(s) Signed: 08/27/2023 3:25:51 PM By: Marolyn Nest MD FACS Previous Signature: 08/27/2023 3:18:28 PM Version By: Marolyn Nest MD FACS Entered By: Marolyn Nest on 08/27/2023 15:25:51 -------------------------------------------------------------------------------- Debridement Details Patient Name: Date of Service: [name], Offie [name]. 08/27/2023 2:15 PM Medical Record Number: [id number] Patient Account Number: [id number] Date of Birth/Sex: Treating RN: 30-Jan-1932 (89 y.o. NETTY Claven Pollen Primary Care Provider: ILEEN BROWNING Other Clinician: Referring Provider: Treating Provider/Extender: Marolyn Nest [facility], [place]HELLE Weeks in Treatment: 4 Debridement Performed for Assessment: Wound #11 Left Knee Performed By: Physician Marolyn Nest, MD The following information was scribed by: Claven Pollen The information was scribed for: Marolyn Nest Debridement Type: Debridement Level of Consciousness (Pre-procedure): Awake and Alert Pre-procedure Verification/Time Out Yes - 15:09 Taken: Start Time: 15:09 Pain Control: Lidocaine  5% topical ointment Percent of Wound Bed Debrided: 100% T Area Debrided (cm): otal  0.2 Tissue and other material debrided: Viable, Non-Viable, Slough, Subcutaneous, Slough Level: Skin/Subcutaneous Tissue Debridement Description: Excisional Instrument: Curette Bleeding: Minimum Hemostasis Achieved: Pressure Response to Treatment: Procedure was tolerated well Level of Consciousness (Post- Awake and Alert procedure): Post Debridement Measurements of Total Wound Length: (cm) 0.5 Width: (cm) 0.5 Depth: (cm) 0.1 Volume: (cm) 0.02 Character of Wound/Ulcer Post Debridement: Improved JEZIAH, KRETSCHMER [name] ([id number]) [id number] Page 2 of 9 Post Procedure Diagnosis Same as Pre-procedure Electronic Signature(s) Signed: 08/27/2023 3:48:34 PM By: Marolyn Nest MD FACS Signed: 08/27/2023 5:01:13 PM By: Claven Pollen RN Entered By: Claven Pollen on 08/27/2023 15:12:26 -------------------------------------------------------------------------------- HPI Details Patient Name: Date of Service: [name], [name] 08/27/2023 2:15 PM Medical Record Number: [id number] Patient Account Number: [id number] Date of Birth/Sex: Treating RN: Jul 08, 1932 (89 y.o. M) Primary Care Provider: ILEEN BROWNING Other Clinician: Referring Provider: Treating Provider/Extender: Marolyn Nest [facility], MICHELLE Weeks in Treatment: 4 History of Present Illness HPI Description: ADMISSION 01/11/2023 This is an 89 year old nondiabetic with a history of chronic venous insufficiency, coronary artery disease, congestive heart failure, and lower extremity edema. He has historically been followed by podiatry for various ulcers and calluses on his feet. When he was seen in the middle of April, he was noted to have an ulcer starting on his left ankle. He was prescribed a course of cephalexin  and advised to apply Betadine  to the wound with an Ace wrap. He was referred to the wound care center for further evaluation and management. Apparently the wound closed of its own accord prior  to his appointment with us , but subsequently reopened. The notes from podiatry do indicate that compression and elevation were discussed as means to help heal ulceration and avoid future episodes. After the wound reopened, he was seen again and prescribed a course of Augmentin . He is now here for his wound care appointment. 01/21/2023: The wound is smaller and more superficial. It is fairly clean with just a little bit of light slough on the surface. His venous reflux studies are scheduled for this coming Thursday.  01/29/2023: The wound is smaller again today and very superficial. There is minimal slough present with a little eschar around the edges. His venous reflux studies were completed, and as expected, he has severe venous reflux. 02/07/2023: His wound is very nearly closed. He has an appointment coming up with vascular surgery on July 8. He indicated a callus on the bottom of his left foot that is bothering him today. 02/15/2023: There is just a pinhole opening remaining on the left, but unfortunately, he has had a new ulceration occur on the medial aspect of his right lower leg. The fat layer is exposed. He does have 2+ pitting edema in this leg. He says he noted fluid coming from the site a couple of days ago. He does not wear compression garments due to discomfort with stockings, but he has never received juxta lite or Farrow wrap garments. 02/22/2023: His left leg has healed. On the right, the open area has contracted considerably. There is some slough and eschar present. He did meet with vascular surgery for his initial consultation and will return in 3 months to discuss saphenous vein ablation. 03/04/2023: His wraps slid down bilaterally and he is leaking clear fluid from the left leg. There is slough build up on the right lower leg wound, but the overall size is smaller. 03/11/2023: The area on his left leg where he was leaking fluid has closed. The right lower leg wound is smaller and clean.  He does have his Farrow wraps with him today. 03/27/2023: At his nurse visit last week, his left medial ankle was found to be open. He has another new wound today on the left anterior tibial surface. The right ankle ulcer is smaller today. Everything is very superficial and clean. 04/04/2023: Despite being in bilateral 3 layer compression equivalent, he has developed new wounds. All of these seem to be secondary to blistering. He has a new wound on his right lateral lower leg just above the ankle and 2 new sites on his left anterolateral lower leg. These are fairly tender and a the skin is little bit erythematous around them. 04/18/2023: His wounds are healed. READMISSION 07/24/2023 This is an 89 year old man we had in clinic from 01/11/2023 through 04/18/2023 with bilateral lower leg wounds felt to be secondary to bilateral chronic venous insufficiency. Eventually he healed up. He was discharged with Aundra wraps and he had a vein and vascular follow-up. Indeed he did see Dr. Serene on 06/03/2023 and it was felt that he would be a candidate for right saphenous vein ablations. Shortly thereafter the patient reports that he developed rapid increase in edema in his legs right greater than left. More recently there was blistering and open wounds in the right posterior and anterior ankle area. He now has open wounds in this area. In discussion with him it is quite clear he was not wearing the Farrow wraps. I am not certain if it was an issue with him not being able to get them on himself or simply disinterested. He says at 1 point my legs were not swollen so I did not put them on. 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 The patient has a history of pulmonary embolism, spinal stenosis and chronic venous insufficiency. I do not think he is felt to have a significant arterial issue. QUADE, [name] ([id number]) [id number] Page 3 of 9 08/13/2023:  He has a new stage III pressure ulcer on his right buttock. There is  slough on the surface. He admits to sitting stationary for prolonged periods of time without shifting his weight. He has a traumatic new wound to his left knee with slough and eschar buildup on the surface. The circumferential right leg wound has healed, but he has a new blister on his right lateral lower leg that has opened. The surface is very clean. He also pointed out a lesion on his left forearm that he says has been bleeding off and on. It is fleshy and somewhat fungating in appearance. 08/21/2023: All of the wounds on his right leg are healed. The wound on his left knee has a little bit of slough present but measures smaller today. The pressure ulcer on his buttock is smaller today with some slough on the surface again. The lesion that I removed from his forearm returned as a pyogenic granuloma and no further treatment is indicated. 08/27/2023: The gluteal ulcer is healed and the wound on his knee is smaller. We sent him home last week in Tubigrip but he did not take it off or apply his compression stockings; he is wearing the exact same Tubigrip sleeves today as he left the clinic with last week. On the right, there is a lot of yellow crusting on the Tubigrip material. Once this was removed, he was found to have open ulceration at the ankle and lower leg on the right. The breakdown is limited to skin. Electronic Signature(s) Signed: 08/27/2023 3:26:25 PM By: Marolyn Nest MD FACS Previous Signature: 08/27/2023 3:25:00 PM Version By: Marolyn Nest MD FACS Previous Signature: 08/27/2023 3:20:48 PM Version By: Marolyn Nest MD FACS Entered By: Marolyn Nest on 08/27/2023 15:26:25 -------------------------------------------------------------------------------- Physical Exam Details Patient Name: Date of Service: [name], Chibueze [name]. 08/27/2023 2:15 PM Medical Record Number: [id number] Patient Account Number:  [id number] Date of Birth/Sex: Treating RN: 16-Sep-1931 (89 y.o. M) Primary Care Provider: ILEEN BROWNING Other Clinician: Referring Provider: Treating Provider/Extender: Marolyn Nest [facility], MICHELLE Weeks in Treatment: 4 Constitutional . . . . no acute distress. Respiratory Normal work of breathing on room air.. Notes 08/27/2023: The gluteal ulcer is healed and the wound on his knee is smaller. On the right, there is a lot of yellow crusting on the Tubigrip material. Once this was removed, he was found to have open ulceration at the ankle and lower leg on the right. The breakdown is limited to skin. Electronic Signature(s) Signed: 08/27/2023 3:27:32 PM By: Marolyn Nest MD FACS Entered By: Marolyn Nest on 08/27/2023 15:27:32 -------------------------------------------------------------------------------- Physician Orders Details Patient Name: Date of Service: [name], [name] 08/27/2023 2:15 PM Medical Record Number: [id number] Patient Account Number: [id number] Date of Birth/Sex: Treating RN: [birth year] (89 y.o. NETTY Claven Pollen Primary Care Provider: ILEEN BROWNING Other Clinician: Referring Provider: Treating Provider/Extender: Marolyn Nest [facility], MICHELLE Weeks in Treatment: 4 Verbal / Phone Orders: No Diagnosis Coding Follow-up Appointments ppointment in 1 week. - Dr. Marolyn - Return A ppointment in 2 weeks. - Dr. Marolyn (front office to schedule) Return A Return appointment in 3 weeks. - Dr. Marolyn (front office to schedule) Other: - Will call you with biopsy results once it returns. TALIB, [name] ([id number]) [id number] Page 4 of 9 Anesthetic (In clinic) Topical Lidocaine  4% applied to wound bed (In clinic) Xylocaine  1% injection applied to wound bed - to left forearm. Bathing/ Shower/ Hygiene May shower with protection but do not get wound dressing(s) wet. Protect dressing(s) with water  repellant cover (for  example, large plastic bag) or a cast cover and  may then take shower. Edema Control - Orders / Instructions Elevate legs to the level of the heart or above for 30 minutes daily and/or when sitting for 3-4 times a day throughout the day. Avoid standing for long periods of time. Other Edema Control Orders/Instructions: - left leg apply urgo k2 lite and lotion. Additional Orders / Instructions Other: - BOTH LEGS-If the Tubigrip sleeve is on the leg (beige stocking)-Place on leg in the morning and take off at night Lymphedema Treatment Plan - Exercise, Compression and Elevation Elevate legs 30 - 60 minutes at or above heart level at least 3 - 4 times daily as able/tolerated Avoid standing for long periods and elevate leg(s) parallel to the floor when sitting Wound Treatment Wound #11 - Knee Wound Laterality: Left Cleanser: Soap and Water  Every Other Day/30 Days Discharge Instructions: May shower and wash wound with dial antibacterial soap and water  prior to dressing change. Cleanser: Vashe 5.8 (oz) Every Other Day/30 Days Discharge Instructions: Cleanse the wound with Vashe prior to applying a clean dressing using gauze sponges, not tissue or cotton balls. Peri-Wound Care: Skin Prep (Generic) Every Other Day/30 Days Discharge Instructions: Use skin prep as directed Prim Dressing: Maxorb Extra Ag+ Alginate Dressing, 2x2 (in/in) (Generic) Every Other Day/30 Days ary Discharge Instructions: Apply to wound bed as instructed Secondary Dressing: Zetuvit Plus Silicone Border Dressing 3x3 (in/in) (Generic) Every Other Day/30 Days Discharge Instructions: Apply silicone border over primary dressing as directed. Compression Stockings: Jobst Farrow Wrap 4000 (DME) Left Leg Compression Amount: 30-40 mmHG Discharge Instructions: Apply Aundra Cools daily as instructed. Apply first thing in the morning, remove at night before bed. Wound #12 - Lower Leg Wound Laterality: Right, Anterior, Distal Cleanser: Soap  and Water  Every Other Day/30 Days Discharge Instructions: May shower and wash wound with dial antibacterial soap and water  prior to dressing change. Cleanser: Vashe 5.8 (oz) Every Other Day/30 Days Discharge Instructions: Cleanse the wound with Vashe prior to applying a clean dressing using gauze sponges, not tissue or cotton balls. Peri-Wound Care: Skin Prep (Generic) Every Other Day/30 Days Discharge Instructions: Use skin prep as directed Prim Dressing: Maxorb Extra Ag+ Alginate Dressing, 2x2 (in/in) (Generic) Every Other Day/30 Days ary Discharge Instructions: Apply to wound bed as instructed Compression Wrap: Urgo K2 Lite, (equivalent to a 3 layer) two layer compression system, regular Every Other Day/30 Days Discharge Instructions: Apply Urgo K2 Lite as directed (alternative to 3 layer compression). Electronic Signature(s) Signed: 08/27/2023 3:48:34 PM By: Marolyn Nest MD FACS Signed: 08/27/2023 5:01:13 PM By: Claven Pollen RN Entered By: Claven Pollen on 08/27/2023 15:29:38 -------------------------------------------------------------------------------- Problem List Details Patient Name: Date of Service: [name], Marv [name]. 08/27/2023 2:15 PM Medical Record Number: [id number] Patient Account Number: [id number] Date of Birth/Sex: Treating RN: Aug 24, 1931 (89 y.o. BRANDN, MCGATH ([id number]) [id number] Page 5 of 9 Primary Care Provider: ILEEN BROWNING Other Clinician: Referring Provider: Treating Provider/Extender: Marolyn Nest [facility], [place]HELLE Weeks in Treatment: 4 Active Problems ICD-10 Encounter Code Description Active Date MDM Diagnosis L97.829 Non-pressure chronic ulcer of other part of left lower leg with unspecified 08/13/2023 No Yes severity L97.818 Non-pressure chronic ulcer of other part of right lower leg with other specified 08/27/2023 No Yes severity I87.333 Chronic venous hypertension (idiopathic) with ulcer and  inflammation of 07/24/2023 No Yes bilateral lower extremity I89.0 Lymphedema, not elsewhere classified 07/24/2023 No Yes Inactive Problems Resolved Problems ICD-10 Code Description Active Date Resolved Date L89.313 Pressure ulcer of right buttock, stage 3 08/13/2023 08/13/2023 L98.8 Other specified  disorders of the skin and subcutaneous tissue 08/13/2023 08/13/2023 Electronic Signature(s) Signed: 08/27/2023 3:25:24 PM By: Marolyn Nest MD FACS Previous Signature: 08/27/2023 3:17:30 PM Version By: Marolyn Nest MD FACS Entered By: Marolyn Nest on 08/27/2023 15:25:23 -------------------------------------------------------------------------------- Progress Note Details Patient Name: Date of Service: [name], [name] 08/27/2023 2:15 PM Medical Record Number: [id number] Patient Account Number: [id number] Date of Birth/Sex: Treating RN: 1931-10-07 (89 y.o. M) Primary Care Provider: ILEEN BROWNING Other Clinician: Referring Provider: Treating Provider/Extender: Marolyn Nest [facility], MICHELLE Weeks in Treatment: 4 Subjective Chief Complaint Information obtained from Patient Patient presents for treatment of an open ulcer due to venous insufficiency 07/24/2023; patient arrives in clinic with circumferential blistering and skin breakdown in the right lower leg History of Present Illness (HPI) ADMISSION 01/11/2023 This is an 89 year old nondiabetic with a history of chronic venous insufficiency, coronary artery disease, congestive heart failure, and lower extremity edema. TARAN, HAYNESWORTH ([id number]) [id number] Page 6 of 9 He has historically been followed by podiatry for various ulcers and calluses on his feet. When he was seen in the middle of April, he was noted to have an ulcer starting on his left ankle. He was prescribed a course of cephalexin  and advised to apply Betadine  to the wound with an Ace wrap. He was referred to the wound care  center for further evaluation and management. Apparently the wound closed of its own accord prior to his appointment with us , but subsequently reopened. The notes from podiatry do indicate that compression and elevation were discussed as means to help heal ulceration and avoid future episodes. After the wound reopened, he was seen again and prescribed a course of Augmentin . He is now here for his wound care appointment. 01/21/2023: The wound is smaller and more superficial. It is fairly clean with just a little bit of light slough on the surface. His venous reflux studies are scheduled for this coming Thursday. 01/29/2023: The wound is smaller again today and very superficial. There is minimal slough present with a little eschar around the edges. His venous reflux studies were completed, and as expected, he has severe venous reflux. 02/07/2023: His wound is very nearly closed. He has an appointment coming up with vascular surgery on July 8. He indicated a callus on the bottom of his left foot that is bothering him today. 02/15/2023: There is just a pinhole opening remaining on the left, but unfortunately, he has had a new ulceration occur on the medial aspect of his right lower leg. The fat layer is exposed. He does have 2+ pitting edema in this leg. He says he noted fluid coming from the site a couple of days ago. He does not wear compression garments due to discomfort with stockings, but he has never received juxta lite or Farrow wrap garments. 02/22/2023: His left leg has healed. On the right, the open area has contracted considerably. There is some slough and eschar present. He did meet with vascular surgery for his initial consultation and will return in 3 months to discuss saphenous vein ablation. 03/04/2023: His wraps slid down bilaterally and he is leaking clear fluid from the left leg. There is slough build up on the right lower leg wound, but the overall size is smaller. 03/11/2023: The area on his  left leg where he was leaking fluid has closed. The right lower leg wound is smaller and clean. He does have his Farrow wraps with him today. 03/27/2023: At his nurse visit last week, his left medial ankle was found  to be open. He has another new wound today on the left anterior tibial surface. The right ankle ulcer is smaller today. Everything is very superficial and clean. 04/04/2023: Despite being in bilateral 3 layer compression equivalent, he has developed new wounds. All of these seem to be secondary to blistering. He has a new wound on his right lateral lower leg just above the ankle and 2 new sites on his left anterolateral lower leg. These are fairly tender and a the skin is little bit erythematous around them. 04/18/2023: His wounds are healed. READMISSION 07/24/2023 This is an 89 year old man we had in clinic from 01/11/2023 through 04/18/2023 with bilateral lower leg wounds felt to be secondary to bilateral chronic venous insufficiency. Eventually he healed up. He was discharged with Aundra wraps and he had a vein and vascular follow-up. Indeed he did see Dr. Serene on 06/03/2023 and it was felt that he would be a candidate for right saphenous vein ablations. Shortly thereafter the patient reports that he developed rapid increase in edema in his legs right greater than left. More recently there was blistering and open wounds in the right posterior and anterior ankle area. He now has open wounds in this area. In discussion with him it is quite clear he was not wearing the Farrow wraps. I am not certain if it was an issue with him not being able to get them on himself or simply disinterested. He says at 1 point my legs were not swollen so I did not put them on. 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 The patient has a history of pulmonary embolism, spinal stenosis and chronic venous insufficiency. I do not think he is felt to have a significant arterial  issue. 08/13/2023: He has a new stage III pressure ulcer on his right buttock. There is slough on the surface. He admits to sitting stationary for prolonged periods of time without shifting his weight. He has a traumatic new wound to his left knee with slough and eschar buildup on the surface. The circumferential right leg wound has healed, but he has a new blister on his right lateral lower leg that has opened. The surface is very clean. He also pointed out a lesion on his left forearm that he says has been bleeding off and on. It is fleshy and somewhat fungating in appearance. 08/21/2023: All of the wounds on his right leg are healed. The wound on his left knee has a little bit of slough present but measures smaller today. The pressure ulcer on his buttock is smaller today with some slough on the surface again. The lesion that I removed from his forearm returned as a pyogenic granuloma and no further treatment is indicated. 08/27/2023: The gluteal ulcer is healed and the wound on his knee is smaller. We sent him home last week in Tubigrip but he did not take it off or apply his compression stockings; he is wearing the exact same Tubigrip sleeves today as he left the clinic with last week. On the right, there is a lot of yellow crusting on the Tubigrip material. Once this was removed, he was found to have open ulceration at the ankle and lower leg on the right. The breakdown is limited to skin. Objective Constitutional no acute distress. Vitals Time Taken: 2:26 AM, Temperature: 98.3 F, Pulse: 66 bpm, Respiratory Rate: 18 breaths/min, Blood Pressure: 117/63 mmHg. Respiratory Normal work of breathing on room air.. General Notes: 08/27/2023: The gluteal  ulcer is healed and the wound on his knee is smaller. On the right, there is a lot of yellow crusting on the Tubigrip material. Once this was removed, he was found to have open ulceration at the ankle and lower leg on the right. The breakdown is limited  to skin. Integumentary (Hair, Skin) SHAYDON, LEASE [name] ([id number]) [id number] Page 7 of 9 Wound #11 status is Open. Original cause of wound was Trauma. The date acquired was: 08/11/2023. The wound has been in treatment 2 weeks. The wound is located on the Left Knee. The wound measures 0.5cm length x 0.5cm width x 0.1cm depth; 0.196cm^2 area and 0.02cm^3 volume. There is Fat Layer (Subcutaneous Tissue) exposed. There is no tunneling or undermining noted. There is a medium amount of serosanguineous drainage noted. The wound margin is distinct with the outline attached to the wound base. There is medium (34-66%) red granulation within the wound bed. There is a medium (34-66%) amount of necrotic tissue within the wound bed including Eschar and Adherent Slough. The periwound skin appearance did not exhibit: Callus, Crepitus, Excoriation, Induration, Rash, Scarring, Dry/Scaly, Maceration, Atrophie Blanche, Cyanosis, Ecchymosis, Hemosiderin Staining, Mottled, Pallor, Rubor, Erythema. Wound #12 status is Open. Original cause of wound was Gradually Appeared. The date acquired was: 08/27/2023. The wound is located on the Right,Distal,Anterior Lower Leg. The wound measures 10cm length x 4cm width x 0.1cm depth; 31.416cm^2 area and 3.142cm^3 volume. There is Fat Layer (Subcutaneous Tissue) exposed. There is no tunneling or undermining noted. There is a medium amount of serosanguineous drainage noted. The wound margin is distinct with the outline attached to the wound base. There is large (67-100%) red granulation within the wound bed. There is a small (1-33%) amount of necrotic tissue within the wound bed including Eschar and Adherent Slough. The periwound skin appearance had no abnormalities noted for texture. The periwound skin appearance had no abnormalities noted for moisture. The periwound skin appearance had no abnormalities noted for color. Periwound temperature was noted as No  Abnormality. Wound #9 status is Healed - Epithelialized. Original cause of wound was Pressure Injury. The date acquired was: 07/29/2023. The wound has been in treatment 3 weeks. The wound is located on the Right Gluteus. The wound measures 0cm length x 0cm width x 0cm depth; 0cm^2 area and 0cm^3 volume. There is no tunneling or undermining noted. There is a none present amount of drainage noted. The wound margin is flat and intact. There is no granulation within the wound bed. There is no necrotic tissue within the wound bed. The periwound skin appearance had no abnormalities noted for texture. The periwound skin appearance had no abnormalities noted for moisture. The periwound skin appearance had no abnormalities noted for color. Periwound temperature was noted as No Abnormality. The periwound has tenderness on palpation. 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 There was a Excisional Skin/Subcutaneous Tissue Debridement with a total area of 0.2 sq cm performed by Marolyn Nest, MD. With the following instrument(s): Curette to remove Viable and Non-Viable tissue/material. Material removed includes Subcutaneous Tissue and Slough and after achieving pain control using Lidocaine  5% topical ointment. No specimens were taken. A time out was  conducted at 15:09, prior to the start of the procedure. A Minimum amount of bleeding was controlled with Pressure. The procedure was tolerated well. Post Debridement Measurements: 0.5cm length x 0.5cm width x 0.1cm depth; 0.02cm^3 volume. Character of  Wound/Ulcer Post Debridement is improved. Post procedure Diagnosis Wound #11: Same as Pre-Procedure Plan Follow-up Appointments: Return Appointment in 1 week. - Dr. Marolyn - Return Appointment in 2 weeks. - Dr. Marolyn (front office to schedule) Return appointment in 3 weeks. - Dr. Marolyn (front office to schedule) Other: - Will call you with biopsy results once it returns. Anesthetic: (In clinic) Topical Lidocaine  4% applied to wound bed (In clinic) Xylocaine  1% injection applied to wound bed - to left forearm. Bathing/ Shower/ Hygiene: May shower with protection but do not get wound dressing(s) wet. Protect dressing(s) with water  repellant cover (for example, large plastic bag) or a cast cover and may then take shower. Edema Control - Orders / Instructions: Elevate legs to the level of the heart or above for 30 minutes daily and/or when sitting for 3-4 times a day throughout the day. Avoid standing for long periods of time. Other Edema Control Orders/Instructions: - left leg apply urgo k2 lite and lotion. Additional Orders / Instructions: Other: - BOTH LEGS-If the Tubigrip sleeve is on the leg (beige stocking)-Place on leg in the morning and take off at night Lymphedema Treatment Plan - Exercise, Compression and Elevation: Elevate legs 30 - 60 minutes at or above heart level at least 3 - 4 times daily as able/tolerated Avoid standing for long periods and elevate leg(s) parallel to the floor when sitting WOUND #11: - Knee Wound Laterality: Left Cleanser: Soap and Water  Every Other Day/30 Days Discharge Instructions: May shower and wash wound with dial antibacterial soap and water  prior to dressing change. Cleanser: Vashe 5.8 (oz) Every Other Day/30 Days Discharge Instructions: Cleanse the wound with Vashe prior to applying a clean dressing using gauze sponges, not tissue or cotton balls. Peri-Wound Care: Skin Prep (Generic) Every Other Day/30 Days Discharge Instructions: Use skin prep as  directed Prim Dressing: Maxorb Extra Ag+ Alginate Dressing, 2x2 (in/in) (Generic) Every Other Day/30 Days ary Discharge Instructions: Apply to wound bed as instructed Secondary Dressing: Zetuvit Plus Silicone Border Dressing 3x3 (in/in) (Generic) Every Other Day/30 Days URIEL, [name] ([id number]) [id number] Page 8 of 9 Discharge Instructions: Apply silicone border over primary dressing as directed. WOUND #12: - Lower Leg Wound Laterality: Right, Anterior, Distal Cleanser: Soap and Water  Every Other Day/30 Days Discharge Instructions: May shower and wash wound with dial antibacterial soap and water  prior to dressing change. Cleanser: Vashe 5.8 (oz) Every Other Day/30 Days Discharge Instructions: Cleanse the wound with Vashe prior to applying a clean dressing using gauze sponges, not tissue or cotton balls. Peri-Wound Care: Skin Prep (Generic) Every Other Day/30 Days Discharge Instructions: Use skin prep as directed Prim Dressing: Maxorb Extra Ag+ Alginate Dressing, 2x2 (in/in) (Generic) Every Other Day/30 Days ary Discharge Instructions: Apply to wound bed as instructed Com pression Wrap: Urgo K2 Lite, (equivalent to a 3 layer) two layer compression system, regular Every Other Day/30 Days Discharge Instructions: Apply Urgo K2 Lite as directed (alternative to 3 layer compression). 08/27/2023: The gluteal ulcer is healed and the wound on his knee is smaller. We sent him home last week in Tubigrip but he did not take it off or apply his compression stockings; he is wearing the exact same Tubigrip sleeves today as he left the clinic with last week. On the  right, there is a lot of yellow crusting on the Tubigrip material. Once this was removed, he was found to have open ulceration at the ankle and lower leg on the right. The breakdown is limited to skin. The new wounds on his right leg did not require any debridement. We will apply silver alginate and Urgo light  compression here. I used a curette to debride slough and subcutaneous tissue from the knee wound. Continue silver alginate with a foam border dressing. He does have compression garments but says that he lost one of the sleeves. We will order him a new wrap so that he can wear the sleeves bilaterally. Follow-up in 1 week. Electronic Signature(s) Signed: 08/27/2023 3:31:54 PM By: Marolyn Nest MD FACS Entered By: Marolyn Nest on 08/27/2023 15:31:54 -------------------------------------------------------------------------------- SuperBill Details Patient Name: Date of Service: [name], [name] 08/27/2023 Medical Record Number: [id number] Patient Account Number: [id number] Date of Birth/Sex: Treating RN: 1932/01/28 (89 y.o. M) Primary Care Provider: ILEEN BROWNING Other Clinician: Referring Provider: Treating Provider/Extender: Marolyn Nest [facility], MICHELLE Weeks in Treatment: 4 Diagnosis Coding ICD-10 Codes Code Description [phone] Non-pressure chronic ulcer of other part of left lower leg with unspecified severity L97.818 Non-pressure chronic ulcer of other part of right lower leg with other specified severity I87.333 Chronic venous hypertension (idiopathic) with ulcer and inflammation of bilateral lower extremity I89.0 Lymphedema, not elsewhere classified Facility Procedures : CPT4 Code: [id number] Description: 11042 - DEB SUBQ TISSUE 20 SQ CM/< ICD-10 Diagnosis Description L97.829 Non-pressure chronic ulcer of other part of left lower leg with unspecified seve I87.333 Chronic venous hypertension (idiopathic) with ulcer and inflammation of bilatera  I89.0 Lymphedema, not elsewhere classified Modifier: rity l lower extremity Quantity: 1 Physician Procedures Electronic Signature(s) Signed: 08/27/2023 3:32:26 PM By: Marolyn Nest MD FACS Entered By: Marolyn Nest on 08/27/2023 15:32:26

## 2023-08-29 NOTE — Progress Notes (Signed)
 [name], Jordan ([id number]) [phone].pdf Page 1 of 10 Visit Report for 08/27/2023 Arrival Information Details Patient Name: Date of Service: [name] 08/27/2023 2:15 PM Medical Record Number: [id number] Patient Account Number: [id number] Date of Birth/Sex: Treating RN: [birth year] (89 y.o. M) Primary Care Jamarie Joplin: ILEEN BROWNING Other Clinician: Referring Hafsa Lohn: Treating Briston Lax/Extender: Marolyn Nest [facility], MICHELLE Weeks in Treatment: 4 Visit Information History Since Last Visit Added or deleted any medications: No Patient Arrived: Walker Any new allergies or adverse reactions: No Arrival Time: 14:25 Had a fall or experienced change in No Accompanied By: self activities of daily living that may affect Transfer Assistance: None risk of falls: Patient Identification Verified: Yes Signs or symptoms of abuse/neglect since last visito No Secondary Verification Process Completed: Yes Hospitalized since last visit: No Patient Requires Transmission-Based Precautions: No Implantable device outside of the clinic excluding No Patient Has Alerts: No cellular tissue based products placed in the center since last visit: Pain Present Now: No Electronic Signature(s) Signed: 08/27/2023 3:55:42 PM By: Okey Bonier Entered By: Okey Bonier on 08/27/2023 14:26:21 -------------------------------------------------------------------------------- Compression Therapy Details Patient Name: Date of Service: [name], [name] 08/27/2023 2:15 PM Medical Record Number: [id number] Patient Account Number: [id number] Date of Birth/Sex: Treating RN: 1931/11/30 (89 y.o. [name] Primary Care Anshi Jalloh: ILEEN BROWNING Other Clinician: Referring Ramon Zanders: Treating Rodel Glaspy/Extender: Marolyn Nest [facility], MICHELLE Weeks in Treatment: 4 Compression Therapy Performed for Wound Assessment: Wound #12 Right,Distal,Anterior Lower Leg Performed By:  Clinician Claven Pollen, RN Compression Type: Three Layer Post Procedure Diagnosis Same as Pre-procedure Electronic Signature(s) Signed: 08/27/2023 5:01:13 PM By: Claven Pollen RN Entered By: Claven [name] on 08/27/2023 15:30:18 Encounter Discharge Information Details -------------------------------------------------------------------------------- [name] ([id number]) [id number] Page 2 of 10 Patient Name: Date of Service: [name] 08/27/2023 2:15 PM Medical Record Number: [id number] Patient Account Number: [id number] Date of Birth/Sex: Treating RN: February 17, 1932 (89 y.o. [name] Primary Care Guelda Batson: ILEEN BROWNING Other Clinician: Referring Bernedette Auston: Treating Edmonia Gonser/Extender: Marolyn Nest [facility], MICHELLE Weeks in Treatment: 4 Encounter Discharge Information Items Post Procedure Vitals Discharge Condition: Stable Temperature (F): 98.3 Ambulatory Status: Walker Pulse (bpm): 66 Discharge Destination: Home Respiratory Rate (breaths/min): 18 Transportation: Private Auto Blood Pressure (mmHg): 117/63 Accompanied By: self Schedule Follow-up Appointment: Yes Clinical Summary of Care: Patient Declined Electronic Signature(s) Signed: 08/27/2023 5:01:13 PM By: Claven Pollen RN Entered By: Claven [name] on 08/27/2023 16:37:21 -------------------------------------------------------------------------------- Lower Extremity Assessment Details Patient Name: Date of Service: [name], [name] 08/27/2023 2:15 PM Medical Record Number: [id number] Patient Account Number: [id number] Date of Birth/Sex: Treating RN: [birth year] (89 y.o. [name] Primary Care Doneisha Ivey: ILEEN BROWNING Other Clinician: Referring Aine Strycharz: Treating Korbyn Chopin/Extender: Marolyn Nest [facility], MICHELLE Weeks in Treatment: 4 Edema Assessment Assessed: [Left: No] [Right: No] Edema: [Left: No] [Right: Yes] Calf Left:  Right: Point of Measurement: 33 cm From Medial Instep 32.5 cm 33.5 cm Ankle Left: Right: Point of Measurement: 10 cm From Medial Instep 22.5 cm 23.5 cm Knee To Floor Left: Right: From Medial Instep 40 cm 40 cm Vascular Assessment Pulses: Dorsalis Pedis Palpable: [Left:Yes] [Right:Yes] Extremity colors, hair growth, and conditions: Extremity Color: [Left:Normal] [Right:Normal] Hair Growth on Extremity: [Left:No] [Right:No] Temperature of Extremity: [Left:Warm] [Right:Warm] Capillary Refill: [Left:< 3 seconds] [Right:< 3 seconds] Dependent Rubor: [Left:No No] [Right:No No] Electronic Signature(s) Signed: 08/27/2023 5:01:13 PM By: Claven Pollen RN Entered By: Claven [name] on 08/27/2023 15:26:58 [name] ([id number]) [id number] Page 3 of 10 -------------------------------------------------------------------------------- Multi Wound Chart Details Patient Name:  Date of Service: [name] 08/27/2023 2:15 PM Medical Record Number: [id number] Patient Account Number: [id number] Date of Birth/Sex: Treating RN: 1932/07/16 (89 y.o. M) Primary Care Toretto Tingler: ILEEN BROWNING Other Clinician: Referring Kinda Pottle: Treating Makaylia Hewett/Extender: Marolyn Nest [facility], MICHELLE Weeks in Treatment: 4 Vital Signs Height(in): Pulse(bpm): 66 Weight(lbs): Blood Pressure(mmHg): 117/63 Body Mass Index(BMI): Temperature(F): 98.3 Respiratory Rate(breaths/min): 18 [11:Photos:] [12:No Photos] Left Knee Right, Distal, Anterior Lower Leg Right Gluteus Wound Location: Trauma Gradually Appeared Pressure Injury Wounding Event: Trauma, Other Lymphedema Pressure Ulcer Primary Etiology: Cataracts, Congestive Heart Failure, Cataracts, Congestive Heart Failure, Cataracts, Congestive Heart Failure, Comorbid History: Coronary Artery Disease, Coronary Artery Disease, Coronary Artery Disease, Hypertension, Peripheral Venous Hypertension, Peripheral Venous  Hypertension, Peripheral Venous Disease, Osteoarthritis, Neuropathy Disease, Osteoarthritis, Neuropathy Disease, Osteoarthritis, Neuropathy 08/11/2023 08/27/2023 07/29/2023 Date Acquired: 2 0 3 Weeks of Treatment: Open Open Healed - Epithelialized Wound Status: No No No Wound Recurrence: 0.5x0.5x0.1 10x4x0.1 0x0x0 Measurements L x W x D (cm) 0.196 31.416 0 A (cm) : rea 0.02 3.142 0 Volume (cm) : 79.20% N/A 100.00% % Reduction in A rea: 78.70% N/A 100.00% % Reduction in Volume: Full Thickness Without Exposed Full Thickness Without Exposed Category/Stage III Classification: Support Structures Support Structures Medium Medium None Present Exudate A mount: Serosanguineous Serosanguineous N/A Exudate Type: red, brown red, brown N/A Exudate Color: Distinct, outline attached Distinct, outline attached Flat and Intact Wound Margin: Medium (34-66%) Large (67-100%) None Present (0%) Granulation A mount: Red Red N/A Granulation Quality: Medium (34-66%) Small (1-33%) None Present (0%) Necrotic A mount: Eschar, Adherent Slough Eschar, Adherent Slough N/A Necrotic Tissue: Fat Layer (Subcutaneous Tissue): Yes Fat Layer (Subcutaneous Tissue): Yes Fascia: No Exposed Structures: Fascia: No Fat Layer (Subcutaneous Tissue): No Tendon: No Tendon: No Muscle: No Muscle: No Joint: No Joint: No Bone: No Bone: No Large (67-100%) Small (1-33%) Large (67-100%) Epithelialization: Debridement - Excisional N/A N/A Debridement: Pre-procedure Verification/Time Out 15:09 N/A N/A Taken: Lidocaine  5% topical ointment N/A N/A Pain Control: Subcutaneous, Slough N/A N/A Tissue Debrided: Skin/Subcutaneous Tissue N/A N/A Level: 0.2 N/A N/A Debridement A (sq cm): rea Curette N/A N/A Instrument: Minimum N/A N/A Bleeding: Pressure N/A N/A Hemostasis A chieved: Procedure was tolerated well N/A N/A Debridement Treatment Response: 0.5x0.5x0.1 N/A N/A Post Debridement Measurements L x W x  D (cm) [name], [name] ([id number]) [id number] Page 4 of 10 0.02 N/A N/A Post Debridement Volume: (cm) Excoriation: No No Abnormalities Noted No Abnormalities Noted Periwound Skin Texture: Induration: No Callus: No Crepitus: No Rash: No Scarring: No Maceration: No No Abnormalities Noted No Abnormalities Noted Periwound Skin Moisture: Dry/Scaly: No Atrophie Blanche: No No Abnormalities Noted No Abnormalities Noted Periwound Skin Color: Cyanosis: No Ecchymosis: No Erythema: No Hemosiderin Staining: No Mottled: No Pallor: No Rubor: No N/A No Abnormality No Abnormality Temperature: N/A N/A Yes Tenderness on Palpation: Debridement N/A N/A Procedures Performed: Treatment Notes Electronic Signature(s) Signed: 08/27/2023 3:25:43 PM By: Marolyn Nest MD FACS Previous Signature: 08/27/2023 3:18:21 PM Version By: Marolyn Nest MD FACS Entered By: Marolyn Nest on 08/27/2023 15:25:43 -------------------------------------------------------------------------------- Multi-Disciplinary Care Plan Details Patient Name: Date of Service: [name], [name] 08/27/2023 2:15 PM Medical Record Number: [id number] Patient Account Number: [id number] Date of Birth/Sex: Treating RN: 1932-05-23 (89 y.o. [name] Primary Care Sakeenah Valcarcel: ILEEN BROWNING Other Clinician: Referring Maebelle Sulton: Treating Romen Yutzy/Extender: Marolyn Nest [facility], MICHELLE Weeks in Treatment: 4 Active Inactive Wound/Skin Impairment Nursing Diagnoses: Impaired tissue integrity Knowledge deficit related to ulceration/compromised skin integrity Goals: Patient will have a decrease in wound volume  by X% from date: (specify in notes) Date Initiated: 07/24/2023 Target Resolution Date: 10/11/2023 Goal Status: Active Patient/caregiver will verbalize understanding of skin care regimen Date Initiated: 07/24/2023 Target Resolution Date: 10/11/2023 Goal Status: Active Ulcer/skin  breakdown will have a volume reduction of 30% by week 4 Date Initiated: 07/24/2023 Target Resolution Date: 10/11/2023 Goal Status: Active Interventions: Assess patient/caregiver ability to obtain necessary supplies Assess patient/caregiver ability to perform ulcer/skin care regimen upon admission and as needed Assess ulceration(s) every visit Notes: Electronic Signature(s) Signed: 08/27/2023 5:01:13 PM By: Claven Pollen RN Entered By: Claven [name] on 08/27/2023 16:35:36 [name] ([id number]) [id number] Page 5 of 10 -------------------------------------------------------------------------------- Pain Assessment Details Patient Name: Date of Service: [name] 08/27/2023 2:15 PM Medical Record Number: [id number] Patient Account Number: [id number] Date of Birth/Sex: Treating RN: 1932-03-01 (89 y.o. M) Primary Care Avi Kerschner: ILEEN BROWNING Other Clinician: Referring Jaylyne Breese: Treating Shawntelle Ungar/Extender: Marolyn Nest [facility], MICHELLE Weeks in Treatment: 4 Active Problems Location of Pain Severity and Description of Pain Patient Has Paino No Site Locations Pain Management and Medication Current Pain Management: Electronic Signature(s) Signed: 08/27/2023 3:55:42 PM By: Okey Bonier Entered By: Okey Bonier on 08/27/2023 14:26:53 -------------------------------------------------------------------------------- Patient/Caregiver Education Details Patient Name: Date of Service: [name], Jordan DRAFTS 1/14/2025andnbsp2:15 PM Medical Record Number: [id number] Patient Account Number: [id number] Date of Birth/Gender: Treating RN: 03-12-1932 (89 y.o. [name] Primary Care Physician: ILEEN BROWNING Other Clinician: Referring Physician: Treating Physician/Extender: Marolyn Nest [facility], MICHELLE Weeks in Treatment: 4 Education Assessment Education Provided To: Patient Education Topics Provided Wound/Skin  Impairment: Methods: Explain/Verbal ZAVIAN, SLOWEY ([id number]) [phone].pdf Page 6 of 10 Responses: State content correctly Electronic Signature(s) Signed: 08/27/2023 5:01:13 PM By: Claven Pollen RN Entered By: Claven [name] on 08/27/2023 16:35:55 -------------------------------------------------------------------------------- Wound Assessment Details Patient Name: Date of Service: [name], [name] 08/27/2023 2:15 PM Medical Record Number: [id number] Patient Account Number: [id number] Date of Birth/Sex: Treating RN: 09/11/1931 (89 y.o. M) Primary Care Caitlynne Harbeck: ILEEN BROWNING Other Clinician: Referring Braxtyn Bojarski: Treating Maxi Carreras/Extender: Marolyn Nest [facility], MICHELLE Weeks in Treatment: 4 Wound Status Wound Number: 11 Primary Trauma, Other Etiology: Wound Location: Left Knee Wound Open Wounding Event: Trauma Status: Date Acquired: 08/11/2023 Comorbid Cataracts, Congestive Heart Failure, Coronary Artery Disease, Weeks Of Treatment: 2 History: Hypertension, Peripheral Venous Disease, Osteoarthritis, Clustered Wound: No Neuropathy Photos Wound Measurements Length: (cm) 0.5 Width: (cm) 0.5 Depth: (cm) 0.1 Area: (cm) 0.196 Volume: (cm) 0.02 % Reduction in Area: 79.2% % Reduction in Volume: 78.7% Epithelialization: Large (67-100%) Tunneling: No Undermining: No Wound Description Classification: Full Thickness Without Exposed Suppor Wound Margin: Distinct, outline attached Exudate Amount: Medium Exudate Type: Serosanguineous Exudate Color: red, brown t Structures Foul Odor After Cleansing: No Slough/Fibrino Yes Wound Bed Granulation Amount: Medium (34-66%) Exposed Structure Granulation Quality: Red Fascia Exposed: No Necrotic Amount: Medium (34-66%) Fat Layer (Subcutaneous Tissue) Exposed: Yes Necrotic Quality: Eschar, Adherent Slough Tendon Exposed: No Muscle Exposed: No Joint Exposed: No Bone Exposed: No Periwound Skin  Texture Texture Color No Abnormalities Noted: No No Abnormalities Noted: No MANVEER, GOMES [name] ([id number]) [id number] Page 7 of 10 Callus: No Atrophie Blanche: No Crepitus: No Cyanosis: No Excoriation: No Ecchymosis: No Induration: No Erythema: No Rash: No Hemosiderin Staining: No Scarring: No Mottled: No Pallor: No Moisture Rubor: No No Abnormalities Noted: No Dry / Scaly: No Maceration: No Treatment Notes Wound #11 (Knee) Wound Laterality: Left Cleanser Soap and Water  Discharge Instruction: May shower and wash wound with dial antibacterial soap and water  prior to dressing change. Vashe 5.8 (  oz) Discharge Instruction: Cleanse the wound with Vashe prior to applying a clean dressing using gauze sponges, not tissue or cotton balls. Peri-Wound Care Skin Prep Discharge Instruction: Use skin prep as directed Topical Primary Dressing Maxorb Extra Ag+ Alginate Dressing, 2x2 (in/in) Discharge Instruction: Apply to wound bed as instructed Secondary Dressing Zetuvit Plus Silicone Border Dressing 3x3 (in/in) Discharge Instruction: Apply silicone border over primary dressing as directed. Secured With Compression Wrap Compression Stockings Jobst Farrow Wrap 4000 Quantity: 1 Left Leg Compression Amount: 30-40 mmHg Discharge Instruction: Apply Aundra Cools daily as instructed. Apply first thing in the morning, remove at night before bed. Add-Ons Electronic Signature(s) Signed: 08/27/2023 5:01:13 PM By: Claven Pollen RN Entered By: Claven [name] on 08/27/2023 15:10:55 -------------------------------------------------------------------------------- Wound Assessment Details Patient Name: Date of Service: [name], [name] 08/27/2023 2:15 PM Medical Record Number: [id number] Patient Account Number: [id number] Date of Birth/Sex: Treating RN: [birth year] (89 y.o. [name] Primary Care Chakira Jachim: ILEEN BROWNING Other Clinician: Referring  Homero Hyson: Treating Pennye Beeghly/Extender: Marolyn Nest [facility], MICHELLE Weeks in Treatment: 4 Wound Status Wound Number: 12 Primary Lymphedema Etiology: Wound Location: Right, Distal, Anterior Lower Leg Wound Open Wounding Event: Gradually Appeared Status: Date Acquired: 08/27/2023 Comorbid Cataracts, Congestive Heart Failure, Coronary Artery Disease, Weeks Of Treatment: 0 History: Hypertension, Peripheral Venous Disease, Osteoarthritis, Clustered Wound: No Neuropathy [name], [name] ([id number]) [id number] Page 8 of 10 Wound Measurements Length: (cm) 10 Width: (cm) 4 Depth: (cm) 0.1 Area: (cm) 31.416 Volume: (cm) 3.142 % Reduction in Area: % Reduction in Volume: Epithelialization: Small (1-33%) Tunneling: No Undermining: No Wound Description Classification: Full Thickness Without Exposed Support Structures Wound Margin: Distinct, outline attached Exudate Amount: Medium Exudate Type: Serosanguineous Exudate Color: red, brown Foul Odor After Cleansing: No Slough/Fibrino Yes Wound Bed Granulation Amount: Large (67-100%) Exposed Structure Granulation Quality: Red Fat Layer (Subcutaneous Tissue) Exposed: Yes Necrotic Amount: Small (1-33%) Necrotic Quality: Eschar, Adherent Slough Periwound Skin Texture Texture Color No Abnormalities Noted: Yes No Abnormalities Noted: Yes Moisture Temperature / Pain No Abnormalities Noted: Yes Temperature: No Abnormality Treatment Notes Wound #12 (Lower Leg) Wound Laterality: Right, Anterior, Distal Cleanser Soap and Water  Discharge Instruction: May shower and wash wound with dial antibacterial soap and water  prior to dressing change. Vashe 5.8 (oz) Discharge Instruction: Cleanse the wound with Vashe prior to applying a clean dressing using gauze sponges, not tissue or cotton balls. Peri-Wound Care Skin Prep Discharge Instruction: Use skin prep as directed Topical Primary Dressing Maxorb Extra Ag+  Alginate Dressing, 2x2 (in/in) Discharge Instruction: Apply to wound bed as instructed Secondary Dressing Secured With Compression Wrap Urgo K2 Lite, (equivalent to a 3 layer) two layer compression system, regular Discharge Instruction: Apply Urgo K2 Lite as directed (alternative to 3 layer compression). Compression Stockings Add-Ons Electronic Signature(s) Signed: 08/27/2023 5:01:13 PM By: Claven Pollen RN Entered By: Claven [name] on 08/27/2023 15:24:24 -------------------------------------------------------------------------------- Wound Assessment Details Patient Name: Date of Service: [name], Armonie [name]. 08/27/2023 2:15 PM [name] BANDA [name] ([id number]) [id number] Page 9 of 10 Medical Record Number: [id number] Patient Account Number: [id number] Date of Birth/Sex: Treating RN: 1932/02/20 (89 y.o. M) Primary Care Bernis Stecher: ILEEN BROWNING Other Clinician: Referring Edger Husain: Treating Alfrieda Tarry/Extender: Marolyn Nest [facility], MICHELLE Weeks in Treatment: 4 Wound Status Wound Number: 9 Primary Pressure Ulcer Etiology: Wound Location: Right Gluteus Wound Healed - Epithelialized Wounding Event: Pressure Injury Status: Date Acquired: 07/29/2023 Comorbid Cataracts, Congestive Heart Failure, Coronary Artery Disease, Weeks Of Treatment: 3 History: Hypertension, Peripheral Venous Disease, Osteoarthritis, Clustered Wound: No Neuropathy Photos Wound Measurements  Length: (cm) Width: (cm) Depth: (cm) Area: (cm) Volume: (cm) 0 % Reduction in Area: 100% 0 % Reduction in Volume: 100% 0 Epithelialization: Large (67-100%) 0 Tunneling: No 0 Undermining: No Wound Description Classification: Category/Stage III Wound Margin: Flat and Intact Exudate Amount: None Present Foul Odor After Cleansing: No Slough/Fibrino No Wound Bed Granulation Amount: None Present (0%) Exposed Structure Necrotic Amount: None Present (0%) Fascia Exposed: No Fat Layer  (Subcutaneous Tissue) Exposed: No Tendon Exposed: No Muscle Exposed: No Joint Exposed: No Bone Exposed: No Periwound Skin Texture Texture Color No Abnormalities Noted: Yes No Abnormalities Noted: Yes Moisture Temperature / Pain No Abnormalities Noted: Yes Temperature: No Abnormality Tenderness on Palpation: Yes Electronic Signature(s) Signed: 08/27/2023 5:01:13 PM By: Claven Pollen RN Entered By: Claven [name] on 08/27/2023 15:10:13 -------------------------------------------------------------------------------- Vitals Details Patient Name: Date of Service: [name], Harlin [name]. 08/27/2023 2:15 PM Medical Record Number: [id number] Patient Account Number: [id number] LACEY, DOTSON ([id number]) [id number] Page 10 of 10 Date of Birth/Sex: Treating RN: 1932-04-17 (89 y.o. M) Primary Care Torah Pinnock: Other Clinician: ILEEN BROWNING Referring Diamond Jentz: Treating Chenae Brager/Extender: Marolyn Nest [facility], MICHELLE Weeks in Treatment: 4 Vital Signs Time Taken: 02:26 Temperature (F): 98.3 Pulse (bpm): 66 Respiratory Rate (breaths/min): 18 Blood Pressure (mmHg): 117/63 Reference Range: 80 - 120 mg / dl Electronic Signature(s) Signed: 08/27/2023 3:55:42 PM By: Okey Bonier Entered By: Okey Bonier on 08/27/2023 14:26:43

## 2023-09-03 ENCOUNTER — Encounter (HOSPITAL_BASED_OUTPATIENT_CLINIC_OR_DEPARTMENT_OTHER): Payer: Medicare Other | Admitting: General Surgery

## 2023-09-03 DIAGNOSIS — L97829 Non-pressure chronic ulcer of other part of left lower leg with unspecified severity: Secondary | ICD-10-CM | POA: Diagnosis not present

## 2023-09-03 NOTE — Progress Notes (Signed)
[name], [name] ([id number]) [phone].pdf Page 1 of 9 Visit Report for 09/03/2023 Arrival Information Details Patient Name: Date of Service: [name] 09/03/2023 10:45 A M Medical Record Number: [id number] Patient Account Number: [id number] Date of Birth/Sex: Treating RN: 11-25-1931 (89 y.o. M) Primary Care [name]: [name] Other Clinician: Referring [name]: Treating [name]/Extender: [name] [facility], [name] Weeks in Treatment: 5 Visit Information History Since Last Visit Added or deleted any medications: No Patient Arrived: Walker Any new allergies or adverse reactions: No Arrival Time: 10:53 Had a fall or experienced change in No Accompanied By: self activities of daily living that may affect Transfer Assistance: None risk of falls: Patient Identification Verified: Yes Signs or symptoms of abuse/neglect since last visito No Secondary Verification Process Completed: Yes Hospitalized since last visit: No Patient Requires Transmission-Based Precautions: No Implantable device outside of the clinic excluding No Patient Has Alerts: No cellular tissue based products placed in the center since last visit: Pain Present Now: No Electronic Signature(s) Signed: 09/03/2023 1:54:35 PM By: Dayton Scrape Entered By: Dayton Scrape on 09/03/2023 10:54:02 -------------------------------------------------------------------------------- Compression Therapy Details Patient Name: Date of Service: [name] 09/03/2023 10:45 A M Medical Record Number: [id number] Patient Account Number: [id number] Date of Birth/Sex: Treating RN: [birth year] (89 y.o. Dianna Limbo Primary Care [name] Other Clinician: Referring Glynnis Gavel: Treating Mellie Buccellato/Extender: [name] [facility], [name] Weeks in Treatment: 5 Compression Therapy Performed for Wound Assessment: Wound #12 Right,Distal,Anterior Lower Leg Performed By:  Clinician Jordan Schwalbe, RN Compression Type: Three Layer Post Procedure Diagnosis Same as Pre-procedure Electronic Signature(s) Signed: 09/03/2023 4:21:14 PM By: Jordan Schwalbe RN Entered By: [name] on 09/03/2023 11:08:02 Encounter Discharge Information Details -------------------------------------------------------------------------------- [name] ([id number]) [id number].pdf Page 2 of 9 Patient Name: Date of Service: [name] 09/03/2023 10:45 A M Medical Record Number: [id number] Patient Account Number: [id number] Date of Birth/Sex: Treating RN: Oct 01, 1931 (89 y.o. Dianna Limbo Primary Care [name]: [name] Other Clinician: Referring [name]: Treating [name]/Extender: [name] [facility], [name] Weeks in Treatment: 5 Encounter Discharge Information Items Post Procedure Vitals Discharge Condition: Stable Temperature (F): 97.9 Ambulatory Status: Walker Pulse (bpm): 63 Discharge Destination: Home Respiratory Rate (breaths/min): 18 Transportation: Private Auto Blood Pressure (mmHg): 144/59 Accompanied By: self Schedule Follow-up Appointment: Yes Clinical Summary of Care: Patient Declined Electronic Signature(s) Signed: 09/03/2023 4:21:14 PM By: Jordan Schwalbe RN Entered By: [name] on 09/03/2023 16:03:13 -------------------------------------------------------------------------------- Lower Extremity Assessment Details Patient Name: Date of Service: [place] [name], [name] 09/03/2023 10:45 A M Medical Record Number: [id number] Patient Account Number: [id number] Date of Birth/Sex: Treating RN: 1931-11-27 (89 y.o. Dianna Limbo Primary Care Tekoa Hamor: [name] Other Clinician: Referring Donel Osowski: Treating [name]/Extender: [name] [facility], [name] Weeks in Treatment: 5 Edema Assessment Assessed: [Left: No] [Right: No] Edema: [Left: No] [Right: Yes] Calf Left:  Right: Point of Measurement: 33 cm From Medial Instep 32.5 cm 33.5 cm Ankle Left: Right: Point of Measurement: 10 cm From Medial Instep 22.5 cm 23.5 cm Vascular Assessment Pulses: Dorsalis Pedis Palpable: [Left:Yes] [Right:Yes] Extremity colors, hair growth, and conditions: Extremity Color: [Left:Normal] [Right:Normal] Hair Growth on Extremity: [Left:No] [Right:No] Temperature of Extremity: [Left:Warm] [Right:Warm] Capillary Refill: [Left:< 3 seconds] [Right:< 3 seconds] Dependent Rubor: [Left:No No] [Right:No No] Electronic Signature(s) Signed: 09/03/2023 4:21:14 PM By: Jordan Schwalbe RN Entered By: [name] on 09/03/2023 11:01:54 [name] ([id number]) [id number] Page 3 of 9 -------------------------------------------------------------------------------- Multi Wound Chart Details Patient Name: Date of Service: [name], [name]  09/03/2023 10:45 A M Medical Record Number: [id number] Patient Account Number: [id number] Date of Birth/Sex: Treating RN: 23-Nov-1931 (89 y.o. M) Primary Care [name]: [name] Other Clinician: Referring Ammarie Matsuura: Treating Maryagnes Carrasco/Extender: [name] [facility], [name] Weeks in Treatment: 5 Vital Signs Height(in): Pulse(bpm): 63 Weight(lbs): Blood Pressure(mmHg): 144/59 Body Mass Index(BMI): Temperature(F): 97.9 Respiratory Rate(breaths/min): 18 [11:Photos:] [N/A:N/A] Left Knee Right, Distal, Anterior Lower Leg N/A Wound Location: Trauma Gradually Appeared N/A Wounding Event: Trauma, Other Lymphedema N/A Primary Etiology: Cataracts, Congestive Heart Failure, Cataracts, Congestive Heart Failure, N/A Comorbid History: Coronary Artery Disease, Coronary Artery Disease, Hypertension, Peripheral Venous Hypertension, Peripheral Venous Disease, Osteoarthritis, Neuropathy Disease, Osteoarthritis, Neuropathy 08/11/2023 08/27/2023 N/A Date Acquired: 3 1 N/A Weeks of Treatment: Open Open  N/A Wound Status: No No N/A Wound Recurrence: 0.5x0.5x0.1 0.9x0.5x0.1 N/A Measurements L x W x D (cm) 0.196 0.353 N/A A (cm) : rea 0.02 0.035 N/A Volume (cm) : 79.20% 98.90% N/A % Reduction in A rea: 78.70% 98.90% N/A % Reduction in Volume: Full Thickness Without Exposed Full Thickness Without Exposed N/A Classification: Support Structures Support Structures Medium Medium N/A Exudate A mount: Serosanguineous Serosanguineous N/A Exudate Type: red, brown red, brown N/A Exudate Color: Distinct, outline attached Distinct, outline attached N/A Wound Margin: None Present (0%) Large (67-100%) N/A Granulation A mount: N/A Red N/A Granulation Quality: Large (67-100%) Small (1-33%) N/A Necrotic A mount: Eschar Eschar, Adherent Slough N/A Necrotic Tissue: Fat Layer (Subcutaneous Tissue): Yes Fat Layer (Subcutaneous Tissue): Yes N/A Exposed Structures: Fascia: No Tendon: No Muscle: No Joint: No Bone: No Large (67-100%) Small (1-33%) N/A Epithelialization: Debridement - Selective/Open Wound Debridement - Excisional N/A Debridement: Pre-procedure Verification/Time Out 11:07 11:07 N/A Taken: Lidocaine 4% Topical Solution Lidocaine 4% Topical Solution N/A Pain Control: Necrotic/Eschar, Slough Subcutaneous, Slough N/A Tissue Debrided: Non-Viable Tissue Skin/Subcutaneous Tissue N/A Level: 0.2 0.35 N/A Debridement A (sq cm): rea Curette Curette N/A Instrument: Minimum Minimum N/A Bleeding: Pressure Pressure N/A Hemostasis A chieved: Procedure was tolerated well Procedure was tolerated well N/A Debridement Treatment Response: 0.5x0.5x0.1 0.9x0.5x0.1 N/A Post Debridement Measurements L x W x D (cm) 0.02 0.035 N/A Post Debridement Volume: (cm) [name], [name] ([id number]) [id number] Page 4 of 9 Excoriation: No No Abnormalities Noted N/A Periwound Skin Texture: Induration: No Callus: No Crepitus: No Rash: No Scarring: No Maceration: No  No Abnormalities Noted N/A Periwound Skin Moisture: Dry/Scaly: No Atrophie Blanche: No No Abnormalities Noted N/A Periwound Skin Color: Cyanosis: No Ecchymosis: No Erythema: No Hemosiderin Staining: No Mottled: No Pallor: No Rubor: No N/A No Abnormality N/A Temperature: Debridement Compression Therapy N/A Procedures Performed: Debridement Treatment Notes Electronic Signature(s) Signed: 09/03/2023 11:18:31 AM By: Jordan Guess MD FACS Entered By: [name] on 09/03/2023 11:18:31 -------------------------------------------------------------------------------- Multi-Disciplinary Care Plan Details Patient Name: Date of Service: [name], [name] 09/03/2023 10:45 A M Medical Record Number: [id number] Patient Account Number: [id number] Date of Birth/Sex: Treating RN: 03-05-1932 (89 y.o. Dianna Limbo Primary Care Krisandra Bueno: [name] Other Clinician: Referring Gabriana Wilmott: Treating Meziah Blasingame/Extender: [name] [facility], [name] Weeks in Treatment: 5 Active Inactive Wound/Skin Impairment Nursing Diagnoses: Impaired tissue integrity Knowledge deficit related to ulceration/compromised skin integrity Goals: Patient will have a decrease in wound volume by X% from date: (specify in notes) Date Initiated: 07/24/2023 Target Resolution Date: 11/11/2023 Goal Status: Active Patient/caregiver will verbalize understanding of skin care regimen Date Initiated: 07/24/2023 Target Resolution Date: 11/11/2023 Goal Status: Active Ulcer/skin breakdown will have a volume reduction of 30% by week 4 Date Initiated: 07/24/2023 Target Resolution Date: 11/11/2023 Goal Status: Active Interventions: Assess  patient/caregiver ability to obtain necessary supplies Assess patient/caregiver ability to perform ulcer/skin care regimen upon admission and as needed Assess ulceration(s) every visit Notes: Electronic Signature(s) Signed: 09/03/2023 4:21:14 PM By: Jordan Schwalbe  RN Entered By: [name] on 09/03/2023 16:00:54 [name] ([id number]) [id number] Page 5 of 9 -------------------------------------------------------------------------------- Pain Assessment Details Patient Name: Date of Service: [place] [name] 09/03/2023 10:45 A M Medical Record Number: [id number] Patient Account Number: [id number] Date of Birth/Sex: Treating RN: [birth year] (89 y.o. M) Primary Care Gerline Ratto: [name] Other Clinician: Referring Vivianne Carles: Treating Matthe Sloane/Extender: [name] [facility], [name] Weeks in Treatment: 5 Active Problems Location of Pain Severity and Description of Pain Patient Has Paino No Site Locations Pain Management and Medication Current Pain Management: Electronic Signature(s) Signed: 09/03/2023 1:54:35 PM By: Dayton Scrape Entered By: Dayton Scrape on 09/03/2023 10:54:29 -------------------------------------------------------------------------------- Patient/Caregiver Education Details Patient Name: Date of Service: [name], [name] 1/21/2025andnbsp10:45 A M Medical Record Number: [id number] Patient Account Number: [id number] Date of Birth/Gender: Treating RN: 1932/03/27 (89 y.o. Dianna Limbo Primary Care Physician: [name] Other Clinician: Referring Physician: Treating Physician/Extender: [name] [facility], [name] Weeks in Treatment: 5 Education Assessment Education Provided To: Patient Education Topics Provided Wound/Skin Impairment: Methods: Demonstration, Explain/Verbal Responses: State content correctly [name], [name] ([id number]) [phone].pdf Page 6 of 9 Electronic Signature(s) Signed: 09/03/2023 4:21:14 PM By: Jordan Schwalbe RN Entered By: [name] on 09/03/2023 16:01:38 -------------------------------------------------------------------------------- Wound Assessment Details Patient Name: Date of Service: [place] NA [name],  [name] 09/03/2023 10:45 A M Medical Record Number: [id number] Patient Account Number: [id number] Date of Birth/Sex: Treating RN: Jun 03, 1932 (89 y.o. M) Primary Care Dmarcus Decicco: [name] Other Clinician: Referring Eila Runyan: Treating Elenor Wildes/Extender: [name] [facility], [name] Weeks in Treatment: 5 Wound Status Wound Number: 11 Primary Trauma, Other Etiology: Wound Location: Left Knee Wound Open Wounding Event: Trauma Status: Date Acquired: 08/11/2023 Comorbid Cataracts, Congestive Heart Failure, Coronary Artery Disease, Weeks Of Treatment: 3 History: Hypertension, Peripheral Venous Disease, Osteoarthritis, Clustered Wound: No Neuropathy Photos Wound Measurements Length: (cm) 0.5 Width: (cm) 0.5 Depth: (cm) 0.1 Area: (cm) 0.196 Volume: (cm) 0.02 % Reduction in Area: 79.2% % Reduction in Volume: 78.7% Epithelialization: Large (67-100%) Tunneling: No Undermining: No Wound Description Classification: Full Thickness Without Exposed Support Wound Margin: Distinct, outline attached Exudate Amount: Medium Exudate Type: Serosanguineous Exudate Color: red, brown Structures Foul Odor After Cleansing: No Slough/Fibrino No Wound Bed Granulation Amount: None Present (0%) Exposed Structure Necrotic Amount: Large (67-100%) Fascia Exposed: No Necrotic Quality: Eschar Fat Layer (Subcutaneous Tissue) Exposed: Yes Tendon Exposed: No Muscle Exposed: No Joint Exposed: No Bone Exposed: No Periwound Skin Texture Texture Color No Abnormalities Noted: No No Abnormalities Noted: No Callus: No Atrophie Blanche: No Crepitus: No Cyanosis: No Excoriation: No Ecchymosis: No [name], [name] ([id number]) [phone].pdf Page 7 of 9 Induration: No Erythema: No Rash: No Hemosiderin Staining: No Scarring: No Mottled: No Pallor: No Moisture Rubor: No No Abnormalities Noted: No Dry / Scaly: No Maceration: No Treatment Notes Wound #11 (Knee)  Wound Laterality: Left Cleanser Soap and Water Discharge Instruction: May shower and wash wound with dial antibacterial soap and water prior to dressing change. Vashe 5.8 (oz) Discharge Instruction: Cleanse the wound with Vashe prior to applying a clean dressing using gauze sponges, not tissue or cotton balls. Peri-Wound Care Skin Prep Discharge Instruction: Use skin prep as directed Topical Primary Dressing Maxorb Extra Ag+ Alginate Dressing, 2x2 (in/in) Discharge Instruction: Apply to wound bed as instructed Secondary Dressing Zetuvit Plus Silicone Border Dressing 3x3 (in/in)  Discharge Instruction: Apply silicone border over primary dressing as directed. Secured With Compression Wrap Compression Stockings Jobst Farrow Wrap 4000 Quantity: 1 Left Leg Compression Amount: 30-40 mmHg Discharge Instruction: Apply Renee Pain daily as instructed. Apply first thing in the morning, remove at night before bed. Add-Ons Electronic Signature(s) Signed: 09/03/2023 4:21:14 PM By: Jordan Schwalbe RN Entered By: [name] on 09/03/2023 11:03:11 -------------------------------------------------------------------------------- Wound Assessment Details Patient Name: Date of Service: [name], [name] 09/03/2023 10:45 A M Medical Record Number: [id number] Patient Account Number: [id number] Date of Birth/Sex: Treating RN: [birth year] (89 y.o. M) Primary Care Zakira Ressel: [name] Other Clinician: Referring Ashlay Altieri: Treating Zachariah Pavek/Extender: [name] [facility], [name] Weeks in Treatment: 5 Wound Status Wound Number: 12 Primary Lymphedema Etiology: Wound Location: Right, Distal, Anterior Lower Leg Wound Open Wounding Event: Gradually Appeared Status: Date Acquired: 08/27/2023 Comorbid Cataracts, Congestive Heart Failure, Coronary Artery Disease, Weeks Of Treatment: 1 History: Hypertension, Peripheral Venous Disease, Osteoarthritis, Clustered Wound: No  Neuropathy Photos [name], [name] ([id number]) [phone].pdf Page 8 of 9 Wound Measurements Length: (cm) 0.9 Width: (cm) 0.5 Depth: (cm) 0.1 Area: (cm) 0.353 Volume: (cm) 0.035 % Reduction in Area: 98.9% % Reduction in Volume: 98.9% Epithelialization: Small (1-33%) Tunneling: No Undermining: No Wound Description Classification: Full Thickness Without Exposed Support Structures Wound Margin: Distinct, outline attached Exudate Amount: Medium Exudate Type: Serosanguineous Exudate Color: red, brown Foul Odor After Cleansing: No Slough/Fibrino Yes Wound Bed Granulation Amount: Large (67-100%) Exposed Structure Granulation Quality: Red Fat Layer (Subcutaneous Tissue) Exposed: Yes Necrotic Amount: Small (1-33%) Necrotic Quality: Eschar, Adherent Slough Periwound Skin Texture Texture Color No Abnormalities Noted: Yes No Abnormalities Noted: Yes Moisture Temperature / Pain No Abnormalities Noted: Yes Temperature: No Abnormality Treatment Notes Wound #12 (Lower Leg) Wound Laterality: Right, Anterior, Distal Cleanser Soap and Water Discharge Instruction: May shower and wash wound with dial antibacterial soap and water prior to dressing change. Vashe 5.8 (oz) Discharge Instruction: Cleanse the wound with Vashe prior to applying a clean dressing using gauze sponges, not tissue or cotton balls. Peri-Wound Care Skin Prep Discharge Instruction: Use skin prep as directed Topical Primary Dressing Maxorb Extra Ag+ Alginate Dressing, 2x2 (in/in) Discharge Instruction: Apply to wound bed as instructed Secondary Dressing Secured With Compression Wrap Urgo K2 Lite, (equivalent to a 3 layer) two layer compression system, regular Discharge Instruction: Apply Urgo K2 Lite as directed (alternative to 3 layer compression). Compression Stockings Add-Ons Electronic Signature(s) Signed: 09/03/2023 4:21:14 PM By: Jordan Schwalbe RN Entered By: [name] on  09/03/2023 11:02:59 [name], [name] ([id number]) [id number] Page 9 of 9 -------------------------------------------------------------------------------- Vitals Details Patient Name: Date of Service: [place] [name] 09/03/2023 10:45 A M Medical Record Number: [id number] Patient Account Number: [id number] Date of Birth/Sex: Treating RN: Mar 30, 1932 (89 y.o. M) Primary Care Rakayla Ricklefs: [name] Other Clinician: Referring Loneta Tamplin: Treating Cian Costanzo/Extender: [name] [facility], [name] Weeks in Treatment: 5 Vital Signs Time Taken: 10:54 Temperature (F): 97.9 Pulse (bpm): 63 Respiratory Rate (breaths/min): 18 Blood Pressure (mmHg): 144/59 Reference Range: 80 - 120 mg / dl Electronic Signature(s) Signed: 09/03/2023 1:54:35 PM By: Dayton Scrape Entered By: Dayton Scrape on 09/03/2023 10:54:23

## 2023-09-03 NOTE — Progress Notes (Signed)
[name] ([id number]) [phone].pdf Page 1 of 10 Visit Report for 09/03/2023 Chief Complaint Document Details Patient Name: Date of Service: [name] 09/03/2023 10:45 A M Medical Record Number: [id number] Patient Account Number: [id number] Date of Birth/Sex: Treating RN: Jan 13, 1932 (89 y.o. M) Primary Care Provider: Alcide Clever Other Clinician: Referring Provider: Treating Provider/Extender: Duanne Guess [facility], MICHELLE Weeks in Treatment: 5 Information Obtained from: Patient Chief Complaint Patient presents for treatment of an open ulcer due to venous insufficiency 07/24/2023; patient arrives in clinic with circumferential blistering and skin breakdown in the right lower leg Electronic Signature(s) Signed: 09/03/2023 11:19:12 AM By: Duanne Guess MD FACS Entered By: Duanne Guess on 09/03/2023 11:19:12 -------------------------------------------------------------------------------- Debridement Details Patient Name: Date of Service: [name], [name] 09/03/2023 10:45 A M Medical Record Number: [id number] Patient Account Number: [id number] Date of Birth/Sex: Treating RN: 05-20-1932 (89 y.o. [name] Primary Care Provider: Alcide Clever Other Clinician: Referring Provider: Treating Provider/Extender: Duanne Guess [facility], MICHELLE Weeks in Treatment: 5 Debridement Performed for Assessment: Wound #12 Right,Distal,Anterior Lower Leg Performed By: Physician Duanne Guess, MD The following information was scribed by: Karie Schwalbe The information was scribed for: Duanne Guess Debridement Type: Debridement Level of Consciousness (Pre-procedure): Awake and Alert Pre-procedure Verification/Time Out Yes - 11:07 Taken: Start Time: 11:07 Pain Control: Lidocaine 4% T opical Solution Percent of Wound Bed Debrided: 100% T Area Debrided (cm): otal 0.35 Tissue and other material debrided: Viable, Slough,  Subcutaneous, Slough Level: Skin/Subcutaneous Tissue Debridement Description: Excisional Instrument: Curette Bleeding: Minimum Hemostasis Achieved: Pressure Response to Treatment: Procedure was tolerated well Level of Consciousness (Post- Awake and Alert procedure): Post Debridement Measurements of Total Wound Length: (cm) 0.9 Width: (cm) 0.5 Depth: (cm) 0.1 Volume: (cm) 0.035 Character of Wound/Ulcer Post Debridement: Improved [name], [name] ([id number]) [id number] Page 2 of 10 Post Procedure Diagnosis Same as Pre-procedure Electronic Signature(s) Signed: 09/03/2023 11:41:33 AM By: Duanne Guess MD FACS Signed: 09/03/2023 4:21:14 PM By: Karie Schwalbe RN Entered By: Karie Schwalbe on 09/03/2023 11:12:55 -------------------------------------------------------------------------------- Debridement Details Patient Name: Date of Service: [name], [name] 09/03/2023 10:45 A M Medical Record Number: [id number] Patient Account Number: [id number] Date of Birth/Sex: Treating RN: 05-20-1932 (89 y.o. [name] Primary Care Provider: Alcide Clever Other Clinician: Referring Provider: Treating Provider/Extender: Duanne Guess [facility], MICHELLE Weeks in Treatment: 5 Debridement Performed for Assessment: Wound #11 Left Knee Performed By: Physician Duanne Guess, MD The following information was scribed by: Karie Schwalbe The information was scribed for: Duanne Guess Debridement Type: Debridement Level of Consciousness (Pre-procedure): Awake and Alert Pre-procedure Verification/Time Out Yes - 11:07 Taken: Start Time: 11:07 Pain Control: Lidocaine 4% T opical Solution Percent of Wound Bed Debrided: 100% T Area Debrided (cm): otal 0.2 Tissue and other material debrided: Viable, Eschar, Slough, Slough Level: Non-Viable Tissue Debridement Description: Selective/Open Wound Instrument: Curette Bleeding: Minimum Hemostasis  Achieved: Pressure Response to Treatment: Procedure was tolerated well Level of Consciousness (Post- Awake and Alert procedure): Post Debridement Measurements of Total Wound Length: (cm) 0.5 Width: (cm) 0.5 Depth: (cm) 0.1 Volume: (cm) 0.02 Character of Wound/Ulcer Post Debridement: Improved Post Procedure Diagnosis Same as Pre-procedure Electronic Signature(s) Signed: 09/03/2023 11:41:33 AM By: Duanne Guess MD FACS Signed: 09/03/2023 4:21:14 PM By: Karie Schwalbe RN Entered By: Karie Schwalbe on 09/03/2023 11:13:43 -------------------------------------------------------------------------------- HPI Details Patient Name: Date of Service: [name], [name] 09/03/2023 10:45 A M Medical Record Number: [id number] Patient Account Number: [id number] [name], [name] ([id number]) [id number] Page 3 of 10 Date of  Birth/Sex: Treating RN: 03/03/1932 (89 y.o. M) Primary Care Provider: Other Clinician: Alcide Clever Referring Provider: Treating Provider/Extender: Duanne Guess [facility], MICHELLE Weeks in Treatment: 5 History of Present Illness HPI Description: ADMISSION 01/11/2023 This is an 89 year old nondiabetic with a history of chronic venous insufficiency, coronary artery disease, congestive heart failure, and lower extremity edema. He has historically been followed by podiatry for various ulcers and calluses on his feet. When he was seen in the middle of April, he was noted to have an ulcer starting on his left ankle. He was prescribed a course of cephalexin and advised to apply Betadine to the wound with an Ace wrap. He was referred to the wound care center for further evaluation and management. Apparently the wound closed of its own accord prior to his appointment with [place], but subsequently reopened. The notes from podiatry do indicate that compression and elevation were discussed as means to help heal ulceration and avoid future episodes. After  the wound reopened, he was seen again and prescribed a course of Augmentin. He is now here for his wound care appointment. 01/21/2023: The wound is smaller and more superficial. It is fairly clean with just a little bit of light slough on the surface. His venous reflux studies are scheduled for this coming Thursday. 01/29/2023: The wound is smaller again today and very superficial. There is minimal slough present with a little eschar around the edges. His venous reflux studies were completed, and as expected, he has severe venous reflux. 02/07/2023: His wound is very nearly closed. He has an appointment coming up with vascular surgery on July 8. He indicated a callus on the bottom of his left foot that is bothering him today. 02/15/2023: There is just a pinhole opening remaining on the left, but unfortunately, he has had a new ulceration occur on the medial aspect of his right lower leg. The fat layer is exposed. He does have 2+ pitting edema in this leg. He says he noted fluid coming from the site a couple of days ago. He does not wear compression garments due to discomfort with stockings, but he has never received juxta lite or Farrow wrap garments. 02/22/2023: His left leg has healed. On the right, the open area has contracted considerably. There is some slough and eschar present. He did meet with vascular surgery for his initial consultation and will return in 3 months to discuss saphenous vein ablation. 03/04/2023: His wraps slid down bilaterally and he is leaking clear fluid from the left leg. There is slough build up on the right lower leg wound, but the overall size is smaller. 03/11/2023: The area on his left leg where he was leaking fluid has closed. The right lower leg wound is smaller and clean. He does have his Farrow wraps with him today. 03/27/2023: At his nurse visit last week, his left medial ankle was found to be open. He has another new wound today on the left anterior tibial surface. The  right ankle ulcer is smaller today. Everything is very superficial and clean. 04/04/2023: Despite being in bilateral 3 layer compression equivalent, he has developed new wounds. All of these seem to be secondary to blistering. He has a new wound on his right lateral lower leg just above the ankle and 2 new sites on his left anterolateral lower leg. These are fairly tender and a the skin is little bit erythematous around them. 04/18/2023: His wounds are healed. READMISSION 07/24/2023 This is an 89 year old man we had in clinic from 01/11/2023  through 04/18/2023 with bilateral lower leg wounds felt to be secondary to bilateral chronic venous insufficiency. Eventually he healed up. He was discharged with Fabian November wraps and he had a vein and vascular follow-up. Indeed he did see Dr. Myra Gianotti on 06/03/2023 and it was felt that he would be a candidate for right saphenous vein ablations. Shortly thereafter the patient reports that he developed rapid increase in edema in his legs right greater than left. More recently there was blistering and open wounds in the right posterior and anterior ankle area. He now has open wounds in this area. In discussion with him it is quite clear he was not wearing the Farrow wraps. I am not certain if it was an issue with him not being able to get them on himself or simply disinterested. He says at 1 point my legs were not swollen so I did not put them on. 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 The patient has a history of pulmonary embolism, spinal stenosis and chronic venous insufficiency. I do not think he is felt to have a significant arterial issue. 08/13/2023: He has a new stage III pressure ulcer on his right buttock. There is slough on the surface. He admits to sitting stationary for prolonged periods of time without shifting his weight. He has a traumatic new wound to his left knee with slough and eschar buildup on the surface. The  circumferential right leg wound has healed, but he has a new blister on his right lateral lower leg that has opened. The surface is very clean. He also pointed out a lesion on his left forearm that he says has been bleeding off and on. It is fleshy and somewhat fungating in appearance. 08/21/2023: All of the wounds on his right leg are healed. The wound on his left knee has a little bit of slough present but measures smaller today. The pressure ulcer on his buttock is smaller today with some slough on the surface again. The lesion that I removed from his forearm returned as a pyogenic granuloma and no further treatment is indicated. 08/27/2023: The gluteal ulcer is healed and the wound on his knee is smaller. We sent him home last week in Tub[place] but he did not take it off or apply his compression stockings; he is wearing the exact same Tubigrip sleeves today as he left the clinic with last week. On the right, there is a lot of yellow crusting on the Tubigrip material. Once this was removed, he was found to have open ulceration at the ankle and lower leg on the right. The breakdown is limited to skin. 09/03/2023: The wound on his knee is a little bit smaller again today. The wound on his right lower leg is smaller, but a little bit deeper, with the fat layer exposed. The skin breakdown at his right ankle has resolved. Electronic Signature(s) Signed: 09/03/2023 11:20:22 AM By: Duanne Guess MD FACS Entered By: Duanne Guess on 09/03/2023 11:20:22 [name] ([id number]) [id number] Page 4 of 10 -------------------------------------------------------------------------------- Physical Exam Details Patient Name: Date of Service: [name] 09/03/2023 10:45 A M Medical Record Number: [id number] Patient Account Number: [id number] Date of Birth/Sex: Treating RN: Feb 13, 1932 (89 y.o. M) Primary Care Provider: Alcide Clever Other Clinician: Referring  Provider: Treating Provider/Extender: Duanne Guess [facility], MICHELLE Weeks in Treatment: 5 Constitutional Slightly hypertensive. . . . no acute distress. Respiratory Normal work of breathing on room air.. Notes 09/03/2023: The wound on  his knee is a little bit smaller again today. The wound on his right lower leg is smaller, but a little bit deeper, with the fat layer exposed. The skin breakdown at his right ankle has resolved. Electronic Signature(s) Signed: 09/03/2023 11:21:34 AM By: Duanne Guess MD FACS Entered By: Duanne Guess on 09/03/2023 11:21:34 -------------------------------------------------------------------------------- Physician Orders Details Patient Name: Date of Service: [name], [name] 09/03/2023 10:45 A M Medical Record Number: [id number] Patient Account Number: [id number] Date of Birth/Sex: Treating RN: 1931/10/23 (89 y.o. [name] Primary Care Provider: Alcide Clever Other Clinician: Referring Provider: Treating Provider/Extender: Duanne Guess [facility], MICHELLE Weeks in Treatment: 5 Verbal / Phone Orders: No Diagnosis Coding ICD-10 Coding Code Description [id number] Non-pressure chronic ulcer of other part of left lower leg with unspecified severity L97.818 Non-pressure chronic ulcer of other part of right lower leg with other specified severity I89.0 Lymphedema, not elsewhere classified I87.333 Chronic venous hypertension (idiopathic) with ulcer and inflammation of bilateral lower extremity Follow-up Appointments ppointment in 1 week. - Dr. Lady Gary -09/10/23 at 1:15pm Return A ppointment in 2 weeks. - Dr. Lady Gary (front office to schedule) Return A Return appointment in 3 weeks. - Dr. Lady Gary (front office to schedule) Other: - Will call you with biopsy results once it returns. Anesthetic (In clinic) Topical Lidocaine 4% applied to wound bed (In clinic) Xylocaine 1% injection applied to wound bed - to left forearm. Bathing/  Shower/ Hygiene May shower with protection but do not get wound dressing(s) wet. Protect dressing(s) with water repellant cover (for example, large plastic bag) or a cast cover and may then take shower. Edema Control - Orders / Instructions Elevate legs to the level of the heart or above for 30 minutes daily and/or when sitting for 3-4 times a day throughout the day. Avoid standing for long periods of time. Other Edema Control Orders/Instructions: - left leg apply urgo k2 lite and lotion. [name], [name] ([id number]) [phone].pdf Page 5 of 10 Additional Orders / Instructions Other: - BOTH LEGS-If the Tubigrip sleeve is on the leg (beige stocking)-Place on leg in the morning and take off at night Lymphedema Treatment Plan - Exercise, Compression and Elevation Elevate legs 30 - 60 minutes at or above heart level at least 3 - 4 times daily as able/tolerated Avoid standing for long periods and elevate leg(s) parallel to the floor when sitting Wound Treatment Wound #11 - Knee Wound Laterality: Left Cleanser: Soap and Water Every Other Day/30 Days Discharge Instructions: May shower and wash wound with dial antibacterial soap and water prior to dressing change. Cleanser: Vashe 5.8 (oz) Every Other Day/30 Days Discharge Instructions: Cleanse the wound with Vashe prior to applying a clean dressing using gauze sponges, not tissue or cotton balls. Peri-Wound Care: Skin Prep (Generic) Every Other Day/30 Days Discharge Instructions: Use skin prep as directed Prim Dressing: Maxorb Extra Ag+ Alginate Dressing, 2x2 (in/in) (Generic) Every Other Day/30 Days ary Discharge Instructions: Apply to wound bed as instructed Secondary Dressing: Zetuvit Plus Silicone Border Dressing 3x3 (in/in) (Generic) Every Other Day/30 Days Discharge Instructions: Apply silicone border over primary dressing as directed. Compression Stockings: Jobst Farrow Wrap 4000 Left Leg Compression Amount: 30-40  mmHG Discharge Instructions: Apply Renee Pain daily as instructed. Apply first thing in the morning, remove at night before bed. Wound #12 - Lower Leg Wound Laterality: Right, Anterior, Distal Cleanser: Soap and Water Every Other Day/30 Days Discharge Instructions: May shower and wash wound with dial antibacterial soap and water prior to dressing change. Cleanser:  Vashe 5.8 (oz) Every Other Day/30 Days Discharge Instructions: Cleanse the wound with Vashe prior to applying a clean dressing using gauze sponges, not tissue or cotton balls. Peri-Wound Care: Skin Prep (Generic) Every Other Day/30 Days Discharge Instructions: Use skin prep as directed Prim Dressing: Maxorb Extra Ag+ Alginate Dressing, 2x2 (in/in) (Generic) Every Other Day/30 Days ary Discharge Instructions: Apply to wound bed as instructed Compression Wrap: Urgo K2 Lite, (equivalent to a 3 layer) two layer compression system, regular Every Other Day/30 Days Discharge Instructions: Apply Urgo K2 Lite as directed (alternative to 3 layer compression). Electronic Signature(s) Signed: 09/03/2023 11:41:33 AM By: Duanne Guess MD FACS Entered By: Duanne Guess on 09/03/2023 11:22:52 -------------------------------------------------------------------------------- Problem List Details Patient Name: Date of Service: [name], Safir [name]. 09/03/2023 10:45 A M Medical Record Number: [id number] Patient Account Number: [id number] Date of Birth/Sex: Treating RN: Dec 13, 1931 (89 y.o. M) Primary Care Provider: Alcide Clever Other Clinician: Referring Provider: Treating Provider/Extender: Duanne Guess [facility], MICHELLE Weeks in Treatment: 5 Active Problems ICD-10 Encounter Code Description Active Date MDM Diagnosis L97.829 Non-pressure chronic ulcer of other part of left lower leg with unspecified 08/13/2023 No Yes severity [name], [name] ([id number]) [phone].pdf Page 6 of 10 L97.818 Non-pressure  chronic ulcer of other part of right lower leg with other specified 08/27/2023 No Yes severity I89.0 Lymphedema, not elsewhere classified 07/24/2023 No Yes I87.333 Chronic venous hypertension (idiopathic) with ulcer and inflammation of 07/24/2023 No Yes bilateral lower extremity Inactive Problems Resolved Problems ICD-10 Code Description Active Date Resolved Date L89.313 Pressure ulcer of right buttock, stage 3 08/13/2023 08/13/2023 L98.8 Other specified disorders of the skin and subcutaneous tissue 08/13/2023 08/13/2023 Electronic Signature(s) Signed: 09/03/2023 11:16:21 AM By: Duanne Guess MD FACS Entered By: Duanne Guess on 09/03/2023 11:16:21 -------------------------------------------------------------------------------- Progress Note Details Patient Name: Date of Service: [name], Hasten [name]. 09/03/2023 10:45 A M Medical Record Number: [id number] Patient Account Number: [id number] Date of Birth/Sex: Treating RN: 02-28-1932 (89 y.o. M) Primary Care Provider: Alcide Clever Other Clinician: Referring Provider: Treating Provider/Extender: Duanne Guess [facility], MICHELLE Weeks in Treatment: 5 Subjective Chief Complaint Information obtained from Patient Patient presents for treatment of an open ulcer due to venous insufficiency 07/24/2023; patient arrives in clinic with circumferential blistering and skin breakdown in the right lower leg History of Present Illness (HPI) ADMISSION 01/11/2023 This is an 89 year old nondiabetic with a history of chronic venous insufficiency, coronary artery disease, congestive heart failure, and lower extremity edema. He has historically been followed by podiatry for various ulcers and calluses on his feet. When he was seen in the middle of April, he was noted to have an ulcer starting on his left ankle. He was prescribed a course of cephalexin and advised to apply Betadine to the wound with an Ace wrap. He was referred to the wound care  center for further evaluation and management. Apparently the wound closed of its own accord prior to his appointment with [place], but subsequently reopened. The notes from podiatry do indicate that compression and elevation were discussed as means to help heal ulceration and avoid future episodes. After the wound reopened, he was seen again and prescribed a course of Augmentin. He is now here for his wound care appointment. 01/21/2023: The wound is smaller and more superficial. It is fairly clean with just a little bit of light slough on the surface. His venous reflux studies are scheduled for this coming Thursday. 01/29/2023: The wound is smaller again today and very superficial. There is minimal slough present with a  little eschar around the edges. His venous reflux studies were completed, and as expected, he has severe venous reflux. 02/07/2023: His wound is very nearly closed. He has an appointment coming up with vascular surgery on July 8. He indicated a callus on the bottom of his left foot that is bothering him today. 02/15/2023: There is just a pinhole opening remaining on the left, but unfortunately, he has had a new ulceration occur on the medial aspect of his right lower leg. [name], [name] ([id number]) [phone].pdf Page 7 of 10 The fat layer is exposed. He does have 2+ pitting edema in this leg. He says he noted fluid coming from the site a couple of days ago. He does not wear compression garments due to discomfort with stockings, but he has never received juxta lite or Farrow wrap garments. 02/22/2023: His left leg has healed. On the right, the open area has contracted considerably. There is some slough and eschar present. He did meet with vascular surgery for his initial consultation and will return in 3 months to discuss saphenous vein ablation. 03/04/2023: His wraps slid down bilaterally and he is leaking clear fluid from the left leg. There is slough build up on the  right lower leg wound, but the overall size is smaller. 03/11/2023: The area on his left leg where he was leaking fluid has closed. The right lower leg wound is smaller and clean. He does have his Farrow wraps with him today. 03/27/2023: At his nurse visit last week, his left medial ankle was found to be open. He has another new wound today on the left anterior tibial surface. The right ankle ulcer is smaller today. Everything is very superficial and clean. 04/04/2023: Despite being in bilateral 3 layer compression equivalent, he has developed new wounds. All of these seem to be secondary to blistering. He has a new wound on his right lateral lower leg just above the ankle and 2 new sites on his left anterolateral lower leg. These are fairly tender and a the skin is little bit erythematous around them. 04/18/2023: His wounds are healed. READMISSION 07/24/2023 This is an 89 year old man we had in clinic from 01/11/2023 through 04/18/2023 with bilateral lower leg wounds felt to be secondary to bilateral chronic venous insufficiency. Eventually he healed up. He was discharged with Fabian November wraps and he had a vein and vascular follow-up. Indeed he did see Dr. Myra Gianotti on 06/03/2023 and it was felt that he would be a candidate for right saphenous vein ablations. Shortly thereafter the patient reports that he developed rapid increase in edema in his legs right greater than left. More recently there was blistering and open wounds in the right posterior and anterior ankle area. He now has open wounds in this area. In discussion with him it is quite clear he was not wearing the Farrow wraps. I am not certain if it was an issue with him not being able to get them on himself or simply disinterested. He says at 1 point my legs were not swollen so I did not put them on. 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 The patient has a history of pulmonary embolism, spinal stenosis and chronic  venous insufficiency. I do not think he is felt to have a significant arterial issue. 08/13/2023: He has a new stage III pressure ulcer on his right buttock. There is slough on the surface. He admits to sitting stationary for prolonged periods of time without shifting his  weight. He has a traumatic new wound to his left knee with slough and eschar buildup on the surface. The circumferential right leg wound has healed, but he has a new blister on his right lateral lower leg that has opened. The surface is very clean. He also pointed out a lesion on his left forearm that he says has been bleeding off and on. It is fleshy and somewhat fungating in appearance. 08/21/2023: All of the wounds on his right leg are healed. The wound on his left knee has a little bit of slough present but measures smaller today. The pressure ulcer on his buttock is smaller today with some slough on the surface again. The lesion that I removed from his forearm returned as a pyogenic granuloma and no further treatment is indicated. 08/27/2023: The gluteal ulcer is healed and the wound on his knee is smaller. We sent him home last week in Tubigrip but he did not take it off or apply his compression stockings; he is wearing the exact same Tubigrip sleeves today as he left the clinic with last week. On the right, there is a lot of yellow crusting on the Tubigrip material. Once this was removed, he was found to have open ulceration at the ankle and lower leg on the right. The breakdown is limited to skin. 09/03/2023: The wound on his knee is a little bit smaller again today. The wound on his right lower leg is smaller, but a little bit deeper, with the fat layer exposed. The skin breakdown at his right ankle has resolved. Objective Constitutional Slightly hypertensive. no acute distress. Vitals Time Taken: 10:54 AM, Temperature: 97.9 F, Pulse: 63 bpm, Respiratory Rate: 18 breaths/min, Blood Pressure: 144/59 mmHg. Respiratory Normal  work of breathing on room air.. General Notes: 09/03/2023: The wound on his knee is a little bit smaller again today. The wound on his right lower leg is smaller, but a little bit deeper, with the fat layer exposed. The skin breakdown at his right ankle has resolved. Integumentary (Hair, Skin) Wound #11 status is Open. Original cause of wound was Trauma. The date acquired was: 08/11/2023. The wound has been in treatment 3 weeks. The wound is located on the Left Knee. The wound measures 0.5cm length x 0.5cm width x 0.1cm depth; 0.196cm^2 area and 0.02cm^3 volume. There is Fat Layer (Subcutaneous Tissue) exposed. There is no tunneling or undermining noted. There is a medium amount of serosanguineous drainage noted. The wound margin is distinct with the outline attached to the wound base. There is no granulation within the wound bed. There is a large (67-100%) amount of necrotic tissue within the wound bed including Eschar. The periwound skin appearance did not exhibit: Callus, Crepitus, Excoriation, Induration, Rash, Scarring, Dry/Scaly, Maceration, Atrophie Blanche, Cyanosis, Ecchymosis, Hemosiderin Staining, Mottled, Pallor, Rubor, Erythema. Wound #12 status is Open. Original cause of wound was Gradually Appeared. The date acquired was: 08/27/2023. The wound has been in treatment 1 weeks. The wound is located on the Right,Distal,Anterior Lower Leg. The wound measures 0.9cm length x 0.5cm width x 0.1cm depth; 0.353cm^2 area and 0.035cm^3 volume. There is Fat Layer (Subcutaneous Tissue) exposed. There is no tunneling or undermining noted. There is a medium amount of serosanguineous drainage noted. The wound margin is distinct with the outline attached to the wound base. There is large (67-100%) red granulation within the wound bed. There is a small (1-33%) amount of necrotic tissue within the wound bed including Eschar and Adherent Slough. The periwound skin appearance had  no abnormalities noted for  texture. The periwound skin appearance had no abnormalities noted for moisture. The periwound skin appearance had no abnormalities noted for color. [name], [name] ([id number]) [id number].pdf Page 8 of 10 Periwound temperature was noted as No Abnormality. 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 There was a Selective/Open Wound Non-Viable Tissue Debridement with a total area of 0.2 sq cm performed by Duanne Guess, MD. With the following instrument(s): Curette to remove Viable tissue/material. Material removed includes Eschar and Slough and after achieving pain control using Lidocaine 4% T opical Solution. No specimens were taken. A time out was conducted at 11:07, prior to the start of the procedure. A Minimum amount of bleeding was controlled with Pressure. The procedure was tolerated well. Post Debridement Measurements: 0.5cm length x 0.5cm width x 0.1cm depth; 0.02cm^3 volume. Character of Wound/Ulcer Post Debridement is improved. Post procedure Diagnosis Wound #11: Same as Pre-Procedure Wound #12 Pre-procedure diagnosis of Wound #12 is a Lymphedema located on the Right,Distal,Anterior Lower Leg . There was a Excisional Skin/Subcutaneous Tissue Debridement with a total area of 0.35 sq cm performed by Duanne Guess, MD. With the following instrument(s): Curette to remove Viable tissue/material. Material removed includes Subcutaneous Tissue and Slough and after achieving pain control using Lidocaine 4% T opical Solution. No specimens were taken. A time out was  conducted at 11:07, prior to the start of the procedure. A Minimum amount of bleeding was controlled with Pressure. The procedure was tolerated well. Post Debridement Measurements: 0.9cm length x 0.5cm width x 0.1cm depth; 0.035cm^3 volume. Character of Wound/Ulcer Post Debridement is improved. Post procedure Diagnosis Wound #12: Same as Pre-Procedure Pre-procedure diagnosis of Wound #12 is a Lymphedema located on the Right,Distal,Anterior Lower Leg . There was a Three Layer Compression Therapy Procedure by Karie Schwalbe, RN. Post procedure Diagnosis Wound #12: Same as Pre-Procedure Plan Follow-up Appointments: Return Appointment in 1 week. - Dr. Lady Gary -09/10/23 at 1:15pm Return Appointment in 2 weeks. - Dr. Lady Gary (front office to schedule) Return appointment in 3 weeks. - Dr. Lady Gary (front office to schedule) Other: - Will call you with biopsy results once it returns. Anesthetic: (In clinic) Topical Lidocaine 4% applied to wound bed (In clinic) Xylocaine 1% injection applied to wound bed - to left forearm. Bathing/ Shower/ Hygiene: May shower with protection but do not get wound dressing(s) wet. Protect dressing(s) with water repellant cover (for example, large plastic bag) or a cast cover and may then take shower. Edema Control - Orders / Instructions: Elevate legs to the level of the heart or above for 30 minutes daily and/or when sitting for 3-4 times a day throughout the day. Avoid standing for long periods of time. Other Edema Control Orders/Instructions: - left leg apply urgo k2 lite and lotion. Additional Orders / Instructions: Other: - BOTH LEGS-If the Tubigrip sleeve is on the leg (beige stocking)-Place on leg in the morning and take off at night Lymphedema Treatment Plan - Exercise, Compression and Elevation: Elevate legs 30 - 60 minutes at or above heart level at least 3 - 4 times daily as able/tolerated Avoid standing for long periods and elevate leg(s) parallel to the  floor when  sitting WOUND #11: - Knee Wound Laterality: Left Cleanser: Soap and Water Every Other Day/30 Days Discharge Instructions: May shower and wash wound with dial antibacterial soap and water prior to dressing change. Cleanser: Vashe 5.8 (oz) Every Other Day/30 Days Discharge Instructions: Cleanse the wound with Vashe prior to applying a clean dressing using gauze sponges, not tissue or cotton balls. Peri-Wound Care: Skin Prep (Generic) Every Other Day/30 Days Discharge Instructions: Use skin prep as directed Prim Dressing: Maxorb Extra Ag+ Alginate Dressing, 2x2 (in/in) (Generic) Every Other Day/30 Days ary Discharge Instructions: Apply to wound bed as instructed Secondary Dressing: Zetuvit Plus Silicone Border Dressing 3x3 (in/in) (Generic) Every Other Day/30 Days Discharge Instructions: Apply silicone border over primary dressing as directed. Com pression Stockings: Jobst Farrow Wrap 4000 Compression Amount: 30-40 mmHg (left) Discharge Instructions: Apply Renee Pain daily as instructed. Apply first thing in the morning, remove at night before bed. WOUND #12: - Lower Leg Wound Laterality: Right, Anterior, Distal [name], [name] ([id number]) [id number].pdf Page 9 of 10 Cleanser: Soap and Water Every Other Day/30 Days Discharge Instructions: May shower and wash wound with dial antibacterial soap and water prior to dressing change. Cleanser: Vashe 5.8 (oz) Every Other Day/30 Days Discharge Instructions: Cleanse the wound with Vashe prior to applying a clean dressing using gauze sponges, not tissue or cotton balls. Peri-Wound Care: Skin Prep (Generic) Every Other Day/30 Days Discharge Instructions: Use skin prep as directed Prim Dressing: Maxorb Extra Ag+ Alginate Dressing, 2x2 (in/in) (Generic) Every Other Day/30 Days ary Discharge Instructions: Apply to wound bed as instructed Com pression Wrap: Urgo K2 Lite, (equivalent to a 3 layer) two layer compression  system, regular Every Other Day/30 Days Discharge Instructions: Apply Urgo K2 Lite as directed (alternative to 3 layer compression). 09/03/2023: The wound on his knee is a little bit smaller again today. The wound on his right lower leg is smaller, but a little bit deeper, with the fat layer exposed. The skin breakdown at his right ankle has resolved. I used a curette to debride slough and subcutaneous tissue from the right lower leg wound. I debrided eschar and slough from the left knee wound. Will continue silver alginate to both sites. We are using an Urgo lite wrap on the right and the patient's own juxta fit stocking on the left. Follow-up in 1 week. Electronic Signature(s) Signed: 09/03/2023 11:23:36 AM By: Duanne Guess MD FACS Entered By: Duanne Guess on 09/03/2023 11:23:36 -------------------------------------------------------------------------------- SuperBill Details Patient Name: Date of Service: [name], Jakson [name]. 09/03/2023 Medical Record Number: [id number] Patient Account Number: [id number] Date of Birth/Sex: Treating RN: 1932-05-30 (89 y.o. M) Primary Care Provider: Alcide Clever Other Clinician: Referring Provider: Treating Provider/Extender: Duanne Guess [facility], MICHELLE Weeks in Treatment: 5 Diagnosis Coding ICD-10 Codes Code Description [phone] Non-pressure chronic ulcer of other part of left lower leg with unspecified severity L97.818 Non-pressure chronic ulcer of other part of right lower leg with other specified severity I89.0 Lymphedema, not elsewhere classified I87.333 Chronic venous hypertension (idiopathic) with ulcer and inflammation of bilateral lower extremity Facility Procedures : CPT4 Code: [id number] Description: 11042 - DEB SUBQ TISSUE 20 SQ CM/< ICD-10 Diagnosis Description L97.818 Non-pressure chronic ulcer of other part of right lower leg with other specified seve I89.0 Lymphedema, not elsewhere classified I87.333 Chronic venous  hypertension  (idiopathic) with ulcer and inflammation of bilateral low Modifier: rity er extremity Quantity: 1 : CPT4 Code: [id number] Description: 97597 - DEBRIDE WOUND 1ST 20 SQ CM OR < ICD-10 Diagnosis Description L97.829 Non-pressure chronic  ulcer of other part of left lower leg with unspecified severity I89.0 Lymphedema, not elsewhere classified I87.333 Chronic venous hypertension  (idiopathic) with ulcer and inflammation of bilateral low Modifier: er extremity Quantity: 1 Physician Procedures : CPT4 Code Description Modifier [id number] 11042 - WC PHYS SUBQ TISS 20 SQ CM ICD-10 Diagnosis Description L97.818 Non-pressure chronic ulcer of other part of right lower leg with other specified severity I89.0 Lymphedema, not elsewhere classified I87.333  Chronic venous hypertension (idiopathic) with ulcer and inflammation of bilateral lower extremity JUDAS, PETTITT [name] ([id number]) [id number].pdf Page 1 Quantity: 1 0 of 10 : [id number] 97597 - WC PHYS DEBR WO ANESTH 20 SQ CM 1 ICD-10 Diagnosis Description L97.829 Non-pressure chronic ulcer of other part of left lower leg with unspecified severity I89.0 Lymphedema, not elsewhere classified I87.333 Chronic venous hypertension  (idiopathic) with ulcer and inflammation of bilateral lower extremity Quantity: Electronic Signature(s) Signed: 09/03/2023 11:27:49 AM By: Duanne Guess MD FACS Entered By: Duanne Guess on 09/03/2023 11:27:48

## 2023-09-04 ENCOUNTER — Other Ambulatory Visit: Payer: Self-pay | Admitting: *Deleted

## 2023-09-04 MED ORDER — LORAZEPAM 1 MG PO TABS: ORAL_TABLET | ORAL | 0 refills | Status: DC

## 2023-09-08 ENCOUNTER — Other Ambulatory Visit: Payer: Self-pay | Admitting: Internal Medicine

## 2023-09-08 DIAGNOSIS — I10 Essential (primary) hypertension: Secondary | ICD-10-CM

## 2023-09-08 DIAGNOSIS — I5032 Chronic diastolic (congestive) heart failure: Secondary | ICD-10-CM

## 2023-09-08 DIAGNOSIS — R600 Localized edema: Secondary | ICD-10-CM

## 2023-09-10 ENCOUNTER — Encounter (HOSPITAL_BASED_OUTPATIENT_CLINIC_OR_DEPARTMENT_OTHER): Payer: Medicare Other | Admitting: General Surgery

## 2023-09-10 DIAGNOSIS — L97829 Non-pressure chronic ulcer of other part of left lower leg with unspecified severity: Secondary | ICD-10-CM | POA: Diagnosis not present

## 2023-09-12 ENCOUNTER — Encounter: Payer: Self-pay | Admitting: Surgery

## 2023-09-12 ENCOUNTER — Ambulatory Visit: Payer: Medicare Other | Admitting: Surgery

## 2023-09-12 VITALS — BP 157/71 | HR 70 | Temp 97.8°F | Resp 18 | Ht 70.0 in | Wt 188.0 lb

## 2023-09-12 DIAGNOSIS — I83892 Varicose veins of left lower extremities with other complications: Secondary | ICD-10-CM

## 2023-09-12 DIAGNOSIS — I83811 Varicose veins of right lower extremities with pain: Secondary | ICD-10-CM | POA: Diagnosis not present

## 2023-09-12 HISTORY — PX: LASER ABLATION: SHX1947

## 2023-09-12 NOTE — Progress Notes (Signed)
     Laser Ablation Procedure    Date: 09/12/2023 IAIN SAWCHUK DOB:[birth year] Consent signed: Yes     Surgeon: Dr. Coral Else Procedure: Laser Ablation: right Greater Saphenous Vein  BP (!) 157/71 (BP Location: Left Arm, Patient Position: Sitting, Cuff Size: Normal)   Pulse 70   Temp 97.8 F (36.6 C) (Temporal)   Resp 18   Ht 5\' 10"  (1.778 m)   Wt 188 lb (85.3 kg)   SpO2 99%   BMI 26.98 kg/m   Tumescent Anesthesia: 400 cc 0.9% NaCl with 50 cc Lidocaine HCL 1%  and 15 cc 8.4% NaHCO3 Local Anesthesia: 3 cc Lidocaine HCL and NaHCO3 (ratio 2:1) 7 watts continuous mode    Total energy: 1751.1 joules    Total time: 250 seconds Treatment Length: 34 cm Laser Fiber Ref. # [id number]      Lot # [id number]  Patient tolerated procedure well  NOTES :All staff members wore facial masks. Pt has 1 mg (1 tab) of ativan prior to surgical procedure at 8:00 am .pt last dose of eliquis was 09/09/23  Description of Procedure: After marking the course of the secondary varicosities, the patient was placed on the operating table in the supine position, and the right leg was prepped and draped in sterile fashion.   Local anesthetic was administered and under ultrasound guidance the saphenous vein was accessed with a micro needle and guide wire; then the mirco puncture sheath was placed.  A guide wire was inserted saphenofemoral junction , followed by a 5 french sheath.  The position of the sheath and then the laser fiber below the junction was confirmed using the ultrasound.  Tumescent anesthesia was administered along the course of the saphenous vein using ultrasound guidance. The patient was placed in Trendelenburg position and protective laser glasses were placed on patient and staff, and the laser was fired at 7 watts continuous mode for a total of 1751.1 joules.  Steri strip were applied to the insertion site and ABD pads with compression ace wrap were applied over right thigh and at the top of the  saphenofemoral junction. Blood loss was less than 15 cc.  Discharge instructions reviewed with patient and his friend (who is transporting him home) and hardcopy of discharge instructions given to patient to take home. Emphasized to patient that he needed assistance until this evening when going to the bathroom or when getting up or changing positions. Emphasized that he needed to recline with his leg elevated today through Sunday and not put stress or pressure on his leg other than walking.  The patient was taken out of the operating room to his car via wheelchair having tolerated the procedure well.     Due to existing dressings for his wounds, we decided to only treat his GSV reflux and not perform stab phlebectomy.  [name]

## 2023-09-17 ENCOUNTER — Encounter (HOSPITAL_BASED_OUTPATIENT_CLINIC_OR_DEPARTMENT_OTHER): Payer: Medicare Other | Attending: General Surgery | Admitting: General Surgery

## 2023-09-17 DIAGNOSIS — I509 Heart failure, unspecified: Secondary | ICD-10-CM | POA: Diagnosis not present

## 2023-09-17 DIAGNOSIS — I890 Lymphedema, not elsewhere classified: Secondary | ICD-10-CM | POA: Diagnosis not present

## 2023-09-17 DIAGNOSIS — L97319 Non-pressure chronic ulcer of right ankle with unspecified severity: Secondary | ICD-10-CM | POA: Diagnosis not present

## 2023-09-17 DIAGNOSIS — I2510 Atherosclerotic heart disease of native coronary artery without angina pectoris: Secondary | ICD-10-CM | POA: Diagnosis not present

## 2023-09-17 DIAGNOSIS — L97822 Non-pressure chronic ulcer of other part of left lower leg with fat layer exposed: Secondary | ICD-10-CM | POA: Insufficient documentation

## 2023-09-17 DIAGNOSIS — L97322 Non-pressure chronic ulcer of left ankle with fat layer exposed: Secondary | ICD-10-CM | POA: Insufficient documentation

## 2023-09-17 DIAGNOSIS — I87333 Chronic venous hypertension (idiopathic) with ulcer and inflammation of bilateral lower extremity: Secondary | ICD-10-CM | POA: Insufficient documentation

## 2023-09-18 ENCOUNTER — Encounter (HOSPITAL_BASED_OUTPATIENT_CLINIC_OR_DEPARTMENT_OTHER): Payer: Medicare Other | Admitting: General Surgery

## 2023-09-24 ENCOUNTER — Ambulatory Visit (HOSPITAL_BASED_OUTPATIENT_CLINIC_OR_DEPARTMENT_OTHER): Payer: Medicare Other | Admitting: General Surgery

## 2023-09-26 ENCOUNTER — Ambulatory Visit (INDEPENDENT_AMBULATORY_CARE_PROVIDER_SITE_OTHER): Payer: Medicare Other | Admitting: Surgery

## 2023-09-26 ENCOUNTER — Encounter: Payer: Self-pay | Admitting: Surgery

## 2023-09-26 ENCOUNTER — Ambulatory Visit (HOSPITAL_BASED_OUTPATIENT_CLINIC_OR_DEPARTMENT_OTHER): Payer: Medicare Other | Admitting: General Surgery

## 2023-09-26 ENCOUNTER — Ambulatory Visit (HOSPITAL_COMMUNITY)
Admission: RE | Admit: 2023-09-26 | Discharge: 2023-09-26 | Disposition: A | Discharge status: Home / Self Care | Payer: Medicare Other | Source: Ambulatory Visit | Attending: Surgery | Admitting: Surgery

## 2023-09-26 VITALS — BP 149/84 | HR 85 | Temp 97.8°F | Resp 18 | Ht 70.0 in | Wt 176.9 lb

## 2023-09-26 DIAGNOSIS — I83811 Varicose veins of right lower extremities with pain: Secondary | ICD-10-CM | POA: Insufficient documentation

## 2023-09-26 DIAGNOSIS — I83892 Varicose veins of left lower extremities with other complications: Secondary | ICD-10-CM

## 2023-09-26 NOTE — Progress Notes (Signed)
[facility] [place]  Patient name: CAROL LOFTIN MRN: [id number] DOB: [birth year] Sex: male   REASON FOR VISIT:    Follow up  HISOTRY OF PRESENT ILLNESS:    ODIES DESA is a 89 y.o. male who is status post right leg laser ablation on 09-12-2023 for CEAP class 6 disease.  He continues to follow-up at the wound center.  His leg looks much better although he still has persistent swelling   PAST MEDICAL HISTORY:   Past Medical History:  Diagnosis Date   ACTINIC KERATOSIS, HEAD 03/23/2008   ALLERGIC RHINITIS 03/27/2007   Arthritis    BENIGN PROSTATIC HYPERTROPHY, WITH URINARY OBSTRUCTION 10/16/2007   CAD 08/18/2007   "patient denies any issues with heart, does not see cardiologist"   Carpal tunnel syndrome, bilateral 09/04/2016   CARPAL TUNNEL SYNDROME, LEFT 04/13/2008   CHF (congestive heart failure) (HCC)    Chronic back pain    Chronic neck pain    Complication of anesthesia    left lower jaw teeth have been injured in the past and he has lost those, now has 2 loose front teeth due to a mouth injury on 01/02/20   DVT (deep venous thrombosis) (HCC)    ERECTILE DYSFUNCTION, 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   Patellar tendinitis 10/10/2009   Sciatic nerve pain    on Oxycodone as needed   SKIN CANCER, HX OF 03/27/2007   UNS ADVRS EFF UNS RX MEDICINAL&BIOLOGICAL SBSTNC 08/18/2007   Urinary urgency      FAMILY HISTORY:   Family History  Problem Relation Age of Onset   Dementia Mother    Anemia Father    Heart disease Father    Stroke Sister        ICH   Lung  disease Sister     SOCIAL HISTORY:   Social History   Tobacco Use   Smoking status: Former    Current packs/day: 0.00    Average packs/day: 1 pack/day for 33.0 years (33.0 ttl pk-yrs)    Types: Cigarettes    Start date: 09/29/1948    Quit date: 09/29/1981    Years since quitting: 42.0   Smokeless tobacco: Never  Substance Use Topics   Alcohol use: Yes    Alcohol/week: 21.0 standard drinks of alcohol    Types: 21 Shots of liquor per week    Comment: finishes 1/2L per week dark rum.- 3 shots of liquor a day     ALLERGIES:   Allergies  Allergen Reactions   Amlodipine Swelling and Other (See Comments)    Ankle swelling   Lyrica [Pregabalin] Swelling and Other (See Comments)    Hands and feet swell   Gabapentin Other (See Comments)    Lower extremity edema- knees and feet     CURRENT MEDICATIONS:   Current Outpatient Medications  Medication Sig Dispense Refill   aspirin EC 81 MG tablet Take 1 tablet (81 mg total) by mouth daily. Swallow whole. 30 tablet 0   clotrimazole (LOTRIMIN) 1 % cream APPLY TO AFFECTED AREA TWICE A DAY (Patient  taking differently: Apply 1 Application topically 2 (two) times daily as needed (for dryness).) 30 g 0   ELIQUIS 5 MG TABS tablet TAKE 1 TABLET BY MOUTH TWICE A DAY 60 tablet 5   fluticasone (FLONASE) 50 MCG/ACT nasal spray Place 1 spray into both nostrils daily as needed for allergies.     furosemide (LASIX) 20 MG tablet TAKE 1 TABLET BY MOUTH EVERY DAY 90 tablet 0   isosorbide mononitrate (IMDUR) 30 MG 24 hr tablet TAKE 1 TABLET BY MOUTH EVERY DAY 90 tablet 1   lidocaine 4 % Place 1 patch onto the skin daily. 10 patch 0   losartan (COZAAR) 50 MG tablet TAKE 1 TABLET BY MOUTH EVERY DAY 90 tablet 0   metoprolol succinate (TOPROL-XL) 50 MG 24 hr tablet TAKE 1 TABLET BY MOUTH EVERY DAY 90 tablet 3   Oxycodone HCl 10 MG TABS Take 10-20 mg by mouth See admin instructions. Take 20 mg by mouth in the morning and evening. May take an additional 10 mg  once a day around noontime as needed for unresolved pain.     SYMPROIC 0.2 MG TABS Take 0.2 mg by mouth in the morning.     tamsulosin (FLOMAX) 0.4 MG CAPS capsule TAKE 1 CAPSULE BY MOUTH EVERY DAY 90 capsule 1   triamcinolone cream (KENALOG) 0.1 % Apply 1 Application topically 2 (two) times daily as needed (rough/irritated skin).     potassium chloride SA (KLOR-CON M) 20 MEQ tablet Take 1 tablet (20 mEq total) by mouth 2 (two) times daily for 2 days. 4 tablet 0   No current facility-administered medications for this visit.    REVIEW OF SYSTEMS:   [X]  denotes positive finding, [ ]  denotes negative finding Cardiac  Comments:  Chest pain or chest pressure:    Shortness of breath upon exertion:    Short of breath when lying flat:    Irregular heart rhythm:        Vascular    Pain in calf, thigh, or hip brought on by ambulation:    Pain in feet at night that wakes you up from your sleep:     Blood clot in your veins:    Leg swelling:         Pulmonary    Oxygen at home:    Productive cough:     Wheezing:         Neurologic    Sudden weakness in arms or legs:     Sudden numbness in arms or legs:     Sudden onset of difficulty speaking or slurred speech:    Temporary loss of vision in one eye:     Problems with dizziness:         Gastrointestinal    Blood in stool:     Vomited blood:         Genitourinary    Burning when urinating:     Blood in urine:        Psychiatric    Major depression:         Hematologic    Bleeding problems:    Problems with blood clotting too easily:        Skin    Rashes or ulcers:        Constitutional    Fever or chills:      PHYSICAL EXAM:   Vitals:   09/26/23 1027  BP: (!) 149/84  Pulse: 85  Resp: 18  Temp: 97.8 F (36.6 C)  TempSrc: Temporal  SpO2: 97%  Weight: 176 lb 14.4 oz (80.2 kg)  Height: 5\' 10"  (1.778 m)    GENERAL: The patient is a well-nourished male, in no acute distress. The vital signs are documented  above. CARDIAC: There is a regular rate and rhythm.  VASCULAR: Incisions all healed.  Lower extremity edema bilaterally PULMONARY: Non-labored respirations NEUROLOGIC: No focal weakness or paresthesias are detected. SKIN: There are no ulcers or rashes noted. PSYCHIATRIC: The patient has a normal affect.  STUDIES:   I have reviewed his post ablation duplex with the following findings: Right:  - No evidence of deep vein thrombosis from the common femoral through the  popliteal veins.  - Successful ablation of the great saphenous vein from the distal thigh up  to approximately 2.30 cm from the SFJ.   MEDICAL ISSUES:   Status post laser ablation to the right lower extremity for nonhealing wounds.  Ultrasound today shows successful closure of the vein.  He still has persistent edema likely related to lymphatic disease.  I discussed that hopefully with laser ablation this will decrease the risk of ulcer recurrence.  We talked about ongoing use of compression socks and/or compression devices to help with the swelling.  He has similar issues on the left leg but is not interested in doing anything at this time.  He still has his wife to attend to at home    Durene Cal, IV, MD, FACS [facility] [phone] Pager [phone]

## 2023-09-27 ENCOUNTER — Encounter (HOSPITAL_BASED_OUTPATIENT_CLINIC_OR_DEPARTMENT_OTHER): Payer: Medicare Other | Admitting: General Surgery

## 2023-09-27 DIAGNOSIS — I87333 Chronic venous hypertension (idiopathic) with ulcer and inflammation of bilateral lower extremity: Secondary | ICD-10-CM | POA: Diagnosis not present

## 2023-10-03 ENCOUNTER — Encounter (HOSPITAL_BASED_OUTPATIENT_CLINIC_OR_DEPARTMENT_OTHER): Payer: Medicare Other | Admitting: General Surgery

## 2023-10-11 ENCOUNTER — Encounter (HOSPITAL_BASED_OUTPATIENT_CLINIC_OR_DEPARTMENT_OTHER): Payer: Medicare Other | Admitting: General Surgery

## 2023-10-11 DIAGNOSIS — I87333 Chronic venous hypertension (idiopathic) with ulcer and inflammation of bilateral lower extremity: Secondary | ICD-10-CM | POA: Diagnosis not present

## 2023-10-18 ENCOUNTER — Encounter (HOSPITAL_BASED_OUTPATIENT_CLINIC_OR_DEPARTMENT_OTHER): Payer: Medicare Other | Attending: General Surgery | Admitting: General Surgery

## 2023-10-18 DIAGNOSIS — I890 Lymphedema, not elsewhere classified: Secondary | ICD-10-CM | POA: Diagnosis not present

## 2023-10-18 DIAGNOSIS — L97822 Non-pressure chronic ulcer of other part of left lower leg with fat layer exposed: Secondary | ICD-10-CM | POA: Diagnosis not present

## 2023-10-18 DIAGNOSIS — I87333 Chronic venous hypertension (idiopathic) with ulcer and inflammation of bilateral lower extremity: Secondary | ICD-10-CM | POA: Insufficient documentation

## 2023-10-25 ENCOUNTER — Encounter (HOSPITAL_BASED_OUTPATIENT_CLINIC_OR_DEPARTMENT_OTHER): Payer: Medicare Other | Admitting: General Surgery

## 2024-02-02 ENCOUNTER — Other Ambulatory Visit: Payer: Self-pay | Admitting: Internal Medicine

## 2024-03-01 ENCOUNTER — Other Ambulatory Visit: Payer: Self-pay | Admitting: Internal Medicine

## 2024-04-17 ENCOUNTER — Other Ambulatory Visit: Payer: Self-pay | Admitting: Internal Medicine

## 2024-04-17 NOTE — Telephone Encounter (Signed)
 Pt no longer is your patient send to current PCP

## 2024-04-20 ENCOUNTER — Other Ambulatory Visit: Payer: Self-pay | Admitting: Internal Medicine

## 2024-07-06 ENCOUNTER — Other Ambulatory Visit: Payer: Self-pay

## 2024-07-06 ENCOUNTER — Emergency Department (HOSPITAL_COMMUNITY)
Admission: EM | Admit: 2024-07-06 | Discharge: 2024-07-06 | Disposition: A | Discharge status: Home / Self Care | Attending: Emergency Medicine | Admitting: Emergency Medicine

## 2024-07-06 ENCOUNTER — Encounter (HOSPITAL_COMMUNITY): Payer: Self-pay

## 2024-07-06 DIAGNOSIS — I509 Heart failure, unspecified: Secondary | ICD-10-CM | POA: Insufficient documentation

## 2024-07-06 DIAGNOSIS — I110 Hypertensive heart disease with heart failure: Secondary | ICD-10-CM | POA: Insufficient documentation

## 2024-07-06 DIAGNOSIS — X58XXXA Exposure to other specified factors, initial encounter: Secondary | ICD-10-CM | POA: Diagnosis not present

## 2024-07-06 DIAGNOSIS — I2510 Atherosclerotic heart disease of native coronary artery without angina pectoris: Secondary | ICD-10-CM | POA: Diagnosis not present

## 2024-07-06 DIAGNOSIS — S0502XA Injury of conjunctiva and corneal abrasion without foreign body, left eye, initial encounter: Secondary | ICD-10-CM | POA: Insufficient documentation

## 2024-07-06 MED ORDER — ERYTHROMYCIN 5 MG/GM OP OINT
1.0000 | TOPICAL_OINTMENT | Freq: Once | OPHTHALMIC | Status: AC
  Administered 2024-07-06: 1 via OPHTHALMIC
  Filled 2024-07-06: qty 3.5

## 2024-07-06 MED ORDER — FLUORESCEIN SODIUM 1 MG OP STRP
1.0000 | ORAL_STRIP | Freq: Once | OPHTHALMIC | Status: AC
  Administered 2024-07-06: 1 via OPHTHALMIC
  Filled 2024-07-06: qty 1

## 2024-07-06 MED ORDER — TETRACAINE HCL 0.5 % OP SOLN
2.0000 [drp] | Freq: Once | OPHTHALMIC | Status: AC
  Administered 2024-07-06: 2 [drp] via OPHTHALMIC
  Filled 2024-07-06: qty 4

## 2024-07-06 NOTE — Discharge Instructions (Signed)
 Your left eye is having pain likely from a small scrape to the surface of the eye.  Please use the antibiotic ointment by placing half-inch of ointment in the lower left eyelid and blinking it into the eye.  Do this twice daily for the next 7 days.  Please call the eye doctor listed for a close follow-up appointment in the next 1 to 2 days.   Return to the emergency department with any new or suddenly worsening symptoms.

## 2024-07-06 NOTE — ED Triage Notes (Signed)
 Pt arriving via ems from home for evaluation of left eye redness for 3 days with new onset of 10/10 pain x2 hours. Pt denies use of eye drops or contacts and states the pain is sharp. VS per ems 142/70, HR60, 98% on RA. Pt arrives to ed alert and oriented and in no apparent distress

## 2024-07-06 NOTE — ED Provider Notes (Signed)
 Emergency Department Provider Note   I have reviewed the triage vital signs and the nursing notes.   HISTORY  Chief Complaint Eye Problem   HPI [name] is a 89 y.o. male with past history reviewed below presents emergency department left eye pain with some redness.  He said 3 days of pain in total but tonight symptoms became severe, 10 out of 10 pain.  Describes feeling like an ice pick is stabbing his eye.  No vision changes.  Denies any eye injury.  No severe headaches.  He does not wear contact lenses.  No history of glaucoma.   Past Medical History:  Diagnosis Date   ACTINIC KERATOSIS, HEAD 03/23/2008   ALLERGIC RHINITIS 03/27/2007   Arthritis    BENIGN PROSTATIC HYPERTROPHY, WITH URINARY OBSTRUCTION 10/16/2007   CAD 08/18/2007   patient denies any issues with heart, does not see cardiologist   Carpal tunnel syndrome, bilateral 09/04/2016   CARPAL TUNNEL SYNDROME, LEFT 04/13/2008   CHF (congestive heart failure) (HCC)    Chronic back pain    Chronic neck pain    Complication of anesthesia    left lower jaw teeth have been injured in the past and he has lost those, now has 2 loose front teeth due to a mouth injury on 01/02/20   DVT (deep venous thrombosis) (HCC)    ERECTILE DYSFUNCTION, ORGANIC 02/20/2010   Gout, unspecified 02/20/2010   Headache(784.0)    migraines hx of- started at age 70- none since 21   History of melanoma    HYPERLIPIDEMIA, WITH HIGH HDL 10/18/2008   HYPERTENSION 05/09/2007   sees Dr. Norleen Budge   KNEE PAIN 10/10/2009   LOW BACK PAIN 03/27/2007   NEOPLASM, MALIGNANT, SKIN, TRUNK 05/09/2007   squamous cell on scalp, sideburn and ear (right). meyloma on truckl.   Patellar tendinitis 10/10/2009   Sciatic nerve pain    on Oxycodone  as needed   SKIN CANCER, HX OF 03/27/2007   UNS ADVRS EFF UNS RX MEDICINAL&BIOLOGICAL SBSTNC 08/18/2007   Urinary urgency     Review of Systems  Constitutional: No fever/chills Eyes: No visual changes. Positive left eye  pain.  Cardiovascular: Denies chest pain. Respiratory: Denies shortness of breath. Neurological: Negative for HA.   ____________________________________________   PHYSICAL EXAM:  VITAL SIGNS: ED Triage Vitals  Encounter Vitals Group     BP 07/06/24 0201 (!) 195/80     Pulse Rate 07/06/24 0201 60     Resp 07/06/24 0201 18     Temp 07/06/24 0201 97.9 F (36.6 C)     Temp Source 07/06/24 0201 Oral     SpO2 07/06/24 0201 100 %     Weight 07/06/24 0205 185 lb (83.9 kg)     Height 07/06/24 0205 6' (1.829 m)   Constitutional: Alert and oriented. Well appearing and in no acute distress. Eyes: Conjunctivae are slightly injected on the left. PERRL. EOMI. IOP: Left 16; Right 12.  Mild neutral dye uptake without Sidel sign on the left consistent with small corneal abrasion.  Head: Atraumatic. Nose: No congestion/rhinnorhea. Mouth/Throat: Mucous membranes are moist.   Neck: No stridor.   Cardiovascular: Good peripheral circulation.  Respiratory: Normal respiratory effort.  Gastrointestinal: No distention.  Musculoskeletal: No gross deformities of extremities. Neurologic:  Normal speech and language.  Skin:  Skin is warm, dry and intact. No rash noted.  ____________________________________________   PROCEDURES  Procedure(s) performed:   Procedures  None  ____________________________________________   INITIAL IMPRESSION / ASSESSMENT AND  PLAN / ED COURSE  Pertinent labs & imaging results that were available during my care of the patient were reviewed by me and considered in my medical decision making (see chart for details).   This patient is Presenting for Evaluation of eye pain, which does require a range of treatment options, and is a complaint that involves a high risk of morbidity and mortality.  The Differential Diagnoses include corneal abrasion, acute angle glaucoma, open globe, FB in the eye, etc.  Critical Interventions-    Medications  tetracaine  (PONTOCAINE)  0.5 % ophthalmic solution 2 drop (has no administration in time range)  fluorescein  ophthalmic strip 1 strip (has no administration in time range)  erythromycin  ophthalmic ointment 1 Application (has no administration in time range)    Reassessment after intervention: pain resolved rapidly with tetracaine .   Medical Decision Making: Summary:  Patient presents to the emergency department with left eye pain worsening over the past 3 days.  I do appreciate a small corneal abrasion on exam and pain rapidly resolved with tetracaine .  Intraocular pressures within normal limits.  Plan for erythromycin  ointment which will be provided in the ED prior to discharge and contact information for ophthalmology for follow-up.   Patient's presentation is most consistent with acute, uncomplicated illness.   Disposition: discharge  ____________________________________________  FINAL CLINICAL IMPRESSION(S) / ED DIAGNOSES  Final diagnoses:  Abrasion of left cornea, initial encounter    Note:  This document was prepared using Dragon voice recognition software and may include unintentional dictation errors.  Fonda Law, MD, [facility] Emergency Medicine    Iyani Dresner, Fonda MATSU, MD 07/06/24 [phone]

## 2024-07-23 ENCOUNTER — Ambulatory Visit: Admitting: Podiatry

## 2024-07-23 ENCOUNTER — Ambulatory Visit

## 2024-07-23 ENCOUNTER — Encounter: Payer: Self-pay | Admitting: Podiatry

## 2024-07-23 DIAGNOSIS — M216X2 Other acquired deformities of left foot: Secondary | ICD-10-CM

## 2024-07-23 DIAGNOSIS — L84 Corns and callosities: Secondary | ICD-10-CM

## 2024-07-23 DIAGNOSIS — I872 Venous insufficiency (chronic) (peripheral): Secondary | ICD-10-CM

## 2024-07-23 DIAGNOSIS — L97522 Non-pressure chronic ulcer of other part of left foot with fat layer exposed: Secondary | ICD-10-CM

## 2024-07-27 ENCOUNTER — Other Ambulatory Visit

## 2024-07-27 ENCOUNTER — Other Ambulatory Visit: Payer: Self-pay

## 2024-07-27 ENCOUNTER — Ambulatory Visit: Admitting: Surgical

## 2024-07-27 DIAGNOSIS — M25532 Pain in left wrist: Secondary | ICD-10-CM

## 2024-07-27 DIAGNOSIS — M11242 Other chondrocalcinosis, left hand: Secondary | ICD-10-CM

## 2024-07-28 ENCOUNTER — Encounter: Payer: Self-pay | Admitting: Surgical

## 2024-07-28 ENCOUNTER — Ambulatory Visit: Payer: Self-pay | Admitting: Surgical

## 2024-07-28 LAB — SYNOVIAL FLUID, CRYSTAL

## 2024-07-28 MED ORDER — BUPIVACAINE HCL 0.25 % IJ SOLN
1.0000 mL | INTRAMUSCULAR | Status: AC | PRN
  Administered 2024-07-27: 10:00:00 1 mL via INTRA_ARTICULAR

## 2024-07-28 MED ORDER — METHYLPREDNISOLONE ACETATE 40 MG/ML IJ SUSP
20.0000 mg | INTRAMUSCULAR | Status: AC | PRN
  Administered 2024-07-27: 10:00:00 20 mg via INTRA_ARTICULAR

## 2024-07-28 MED ORDER — LIDOCAINE HCL 1 % IJ SOLN
3.0000 mL | INTRAMUSCULAR | Status: AC | PRN
  Administered 2024-07-27: 10:00:00 3 mL

## 2024-07-28 NOTE — Progress Notes (Signed)
°  Subjective:  Patient ID: [name], male    DOB: July 12, 1932,  MRN: [id number]  Chief Complaint  Patient presents with   Callouses    Rm14 Patient complains of painful callous left foot plantar/ 1 month/ callous pads and soaks with  no relief.     89 y.o. male presents for the evaluation of painful callus, ulcer on the left foot.  He said the areas been tender.  Not seeing any drainage or pus.  He does not report any other open lesions at this time.  No fevers or chills.   Objective:  General: AAO x3, NAD  Dermatological:  on the plantar aspect left foot submetatarsal 5 is a hyperkeratotic lesion with macerated tissue.  The wound is preulcerative but there is no definitive skin breakdown identified.  No probing of undermining.  There is no fluctuation or crepitation.  There is no malodor.  No open lesions otherwise.  Vascular:  Dorsalis Pedis artery and Posterior Tibial artery pedal pulses are palpable bilateral.  Capillary fill time < 3 sec to all digits.  Chronic venous changes are present.  Neruologic: Grossly intact via light touch bilateral. Protective threshold intact to all sites bilateral.   Musculoskeletal: Prominence of metatarsal head plantarly with atrophy of the fat pad resulting of the callus, wound on the left foot to metatarsal 5.   Assessment:   Superficial wound left foot, venous insufficiency  Plan:  Patient was evaluated and treated and all questions answered.  Radiology: X-rays were obtained reviewed of the left foot.  Multiple views obtained there is no evidence of acute fracture.  No cortical changes to suggest osteomyelitis.  Heel spurs present.  Other changes present to the rear foot.  In regards to the left foot submetatarsal 5 I sharply debrided hyperkeratotic lesion which is preulcerative.  Discussed moisturizer, offloading.  Dispensed offloading pads.  Monitor closely for any signs or symptoms of ulceration and/or infection  Venous insufficiency  recommend compression, elevation monitor for any skin breakdown.  Return in about 2 months (around 09/23/2024) for callus left foot.  Donnice JONELLE Fees DPM

## 2024-07-28 NOTE — Progress Notes (Signed)
 Hi Betsy, can you call [name] either later today or tomorrow and let him know that he does have pseudogout crystals in his wrist and see if his symptoms are improved from injection?  Thank you

## 2024-07-28 NOTE — Progress Notes (Signed)
 Office Visit Note   Patient: [name]           Date of Birth: 07/18/1932           MRN: [id number] Visit Date: 07/27/2024 Requested by: Ileen Rosaline NOVAK, NP [address] [place],  [place] [address] PCP: Ileen Rosaline NOVAK, NP  Subjective: Chief Complaint  Patient presents with   Left Wrist - Pain    HPI: [name] is a 89 y.o. male who presents to the office reporting severe left wrist pain.  Patient states that this pain in his left wrist is always there to some extent but became quite severe about 5 days ago when he woke up.  It is slightly better today.  Not associated with any fevers or chills or recent injury.  He cannot recall any specific gout flares in his personal medical history but he does say that gout sounds familiar.  He takes oxycodone  50 mg/day from pain management.  Ambulates with a walker that he has to weight-bear with his left and right arms.  Has noticed some swelling primarily around the volar aspect of the left wrist.  Pain is fairly diffuse but somewhat more toward the ulnar aspect of the wrist rather than the radial aspect.  Worse with range of motion though again this is slightly better today..                ROS: All systems reviewed are negative as they relate to the chief complaint within the history of present illness.  Patient denies fevers or chills.  Assessment & Plan: Visit Diagnoses:  1. Pseudogout of joint of hand, left   2. Pain in left wrist     Plan: Impression is 89 year old male with severe left wrist pain that came on overnight and has been slightly better today but still fairly debilitating for him over the last 5 days.  We discussed what may be going on.  There are no significant changes to left wrist radiographs compared with prior imaging studies though at baseline he has severe end-stage arthritis throughout the radiocarpal joint, intercarpal joint, STT joint, first CMC joint.  All of this could be  contributing to his pain but the acute onset nature of his symptoms are somewhat suspicious for crystalline deposition pathology such as gout or pseudogout.  After discussion of options, we will plan for aspiration under ultrasound guidance and injection with cortisone.  About 1 cc was aspirated from the carpal tunnel and sent off for fluid analysis that did show calcium  pyrophosphate crystal deposition.  Cortisone injection administered.  We will see how this does for [name] and he will reach out with any concerns.  During administration of injection, care was taken to avoid needle penetration into the flexor tendons or the median nerve.  Follow-Up Instructions: No follow-ups on file.   Orders:  Orders Placed This Encounter  Procedures   XR Wrist Complete Left   US  Guided Needle Placement - No Linked Charges   Synovial fluid, crystal   No orders of the defined types were placed in this encounter.     Procedures: Carpal tunnel aspiration and injection on 07/27/2024 10:12 AM Indications: pain and diagnostic evaluation Details: 22 G 1.5 in needle, ultrasound-guided volar approach Medications: 3 mL lidocaine  1 %; 20 mg methylPREDNISolone  acetate 40 MG/ML; 1 mL bupivacaine  0.25 % Aspirate: 1 mL blood-tinged; sent for lab analysis Outcome: tolerated well, no immediate complications Procedure, treatment alternatives, risks and benefits  explained, specific risks discussed. Consent was given by the patient. Immediately prior to procedure a time out was called to verify the correct patient, procedure, equipment, support staff and site/side marked as required. Patient was prepped and draped in the usual sterile fashion.       Clinical Data: No additional findings.  Objective: Vital Signs: There were no vitals taken for this visit.  Physical Exam:  Constitutional: Patient appears well-developed HEENT:  Head: Normocephalic Eyes:EOM are normal Neck: Normal range of motion Cardiovascular:  Normal rate Pulmonary/chest: Effort normal Neurologic: Patient is alert Skin: Skin is warm Psychiatric: Patient has normal mood and affect  Ortho Exam: Ortho exam demonstrates left wrist with intact EPL, FPL, finger abduction.  Grip strength is excellent.  Tenderness diffusely throughout the left wrist.  There is swelling particularly on the volar aspect of the wrist which is comparable to previous exams from about a year and a half ago when he had prior carpal tunnel release and there was fluid demonstrated in the carpal tunnel and flexor tendon sheaths.  No fluctuance or cellulitis concerning for infection.  Able to make full composite fist.  There is no streaking or erythema.  Specialty Comments:  EMG/NCS IMPRESSION:   Nerve conduction studies done on both upper extremities and on the left lower extremity shows evidence of bilateral carpal tunnel syndrome of moderate severity on the right, mild severity on the left. NCV evaluation of the left lower extremity shows no motor response for the left peroneal nerve with sensory sparing. EMG evaluation of the left lower extremity shows evidence of a mild chronic and acute S1 radiculopathy. There is no evidence of an overlying L5 radiculopathy. Motor findings by nerve conduction study on the left peroneal nerve likely represent a distal dysfunction of this nerve. EMG evaluation of the right upper extremity shows findings consistent with carpal tunnel syndrome and evidence of a mild overlying chronic C8 radiculopathy. No acute findings were noted in the right upper extremity.   KYM Francis Bull MD 09/04/2016 4:50 PM   [facility]  Imaging: No results found.   PMFS History: Patient Active Problem List   Diagnosis Date Noted   Flexor carpi radialis tenosynovitis 01/13/2023   HCAP (healthcare-associated pneumonia) 08/16/2022   Chest pain 07/27/2022   CHF (congestive heart failure) (HCC) 07/24/2022   Hx pulmonary embolism  06/20/2022   Hypokalemia 06/20/2022   Abnormal EKG 06/19/2022   Fracture of twelfth thoracic vertebra (HCC) 01/03/2022   Opioid dependence (HCC) 01/03/2022   Poor balance 01/03/2022   Pedal edema 01/03/2022   Bilateral wrist pain 06/19/2021   Fall 01/11/2021   Orthostatic hypotension    Weakness    Osteoporosis 10/07/2020   Lumbar compression fracture (HCC) 09/07/2020   Greater trochanteric bursitis of both hips 05/04/2020   Left patella fracture 01/19/2020   Arthritis of left knee 05/07/2019   Pre-operative clearance 04/28/2019   Acute on chronic systolic CHF (congestive heart failure) (HCC) 08/10/2018   Leg wound, left 03/19/2018   Right arm pain 11/11/2017   Primary osteoarthritis of right foot 08/21/2017   Syncope 02/27/2017   Arthritis of right ankle 12/18/2016   Carpal tunnel syndrome, left upper limb 09/04/2016   Leg swelling 08/16/2016   Hand weakness 07/12/2016   Degenerative arthritis of left knee 06/28/2016   Body mass index (BMI) of 28.0 to 28.9 in adult 06/18/2016   S/P cervical spinal fusion 06/14/2016   Iron deficiency anemia 03/16/2016   Arthritis of left acromioclavicular joint 01/03/2016  Rotator cuff disorder 01/03/2016   Osteoarthritis of left knee 04/30/2015   Baker's cyst of knee 04/30/2015   Esophageal stricture 04/26/2015   Benign prostatic hyperplasia 02/21/2015   Constipation 02/21/2015   Myalgia and myositis 11/18/2014   Status post lumbar spine operation 06/25/2014   Pseudoarthrosis of cervical spine (HCC) 05/25/2014   Sacral fracture, closed (HCC) 09/23/2013   Spondylolisthesis, congenital 09/02/2013   Polyarticular arthritis 07/19/2013   Spondylosis of lumbosacral spine at multiple levels without myelopathy 04/24/2013   Chronic radicular low back pain 12/15/2012   Gout 02/20/2010   ERECTILE DYSFUNCTION, ORGANIC 02/20/2010   Hyperlipidemia 10/18/2008   CAD (coronary artery disease) 08/18/2007   Essential hypertension 05/09/2007    ALLERGIC RHINITIS 03/27/2007   Past Medical History:  Diagnosis Date   ACTINIC KERATOSIS, HEAD 03/23/2008   ALLERGIC RHINITIS 03/27/2007   Arthritis    BENIGN PROSTATIC HYPERTROPHY, WITH URINARY OBSTRUCTION 10/16/2007   CAD 08/18/2007   patient denies any issues with heart, does not see cardiologist   Carpal tunnel syndrome, bilateral 09/04/2016   CARPAL TUNNEL SYNDROME, LEFT 04/13/2008   CHF (congestive heart failure) (HCC)    Chronic back pain    Chronic neck pain    Complication of anesthesia    left lower jaw teeth have been injured in the past and he has lost those, now has 2 loose front teeth due to a mouth injury on 01/02/20   DVT (deep venous thrombosis) (HCC)    ERECTILE DYSFUNCTION, ORGANIC 02/20/2010   Gout, unspecified 02/20/2010   Headache(784.0)    migraines hx of- started at age 29- none since 4   History of melanoma    HYPERLIPIDEMIA, WITH HIGH HDL 10/18/2008   HYPERTENSION 05/09/2007   sees Dr. Norleen Budge   KNEE PAIN 10/10/2009   LOW BACK PAIN 03/27/2007   NEOPLASM, MALIGNANT, SKIN, TRUNK 05/09/2007   squamous cell on scalp, sideburn and ear (right). meyloma on truckl.   Patellar tendinitis 10/10/2009   Sciatic nerve pain    on Oxycodone  as needed   SKIN CANCER, HX OF 03/27/2007   UNS ADVRS EFF UNS RX MEDICINAL&BIOLOGICAL SBSTNC 08/18/2007   Urinary urgency     Family History  Problem Relation Age of Onset   Dementia Mother    Anemia Father    Heart disease Father    Stroke Sister        ICH   Lung disease Sister     Past Surgical History:  Procedure Laterality Date   APPENDECTOMY  1969   APPLICATION OF WOUND VAC Left 01/19/2020   Procedure: Application Of Wound Vac;  Surgeon: Addie Cordella Hamilton, MD;  Location: [facility] OR;  Service: Orthopedics;  Laterality: Left;   BLEPHAROPLASTY     CARPAL TUNNEL RELEASE Right    CARPAL TUNNEL RELEASE Left 12/27/2022   Procedure: CARPAL TUNNEL RELEASE WITH GANGLION CYST EXCISION;  Surgeon: Addie Cordella Hamilton, MD;  Location: [facility] OR;   Service: Orthopedics;  Laterality: Left;   CHOLECYSTECTOMY     ESOPHAGOGASTRODUODENOSCOPY (EGD) WITH PROPOFOL  N/A 04/26/2015   Procedure: ESOPHAGOGASTRODUODENOSCOPY (EGD) WITH PROPOFOL ;  Surgeon: Lamar JONETTA Aho, MD;  Location: [facility] ENDOSCOPY;  Service: Endoscopy;  Laterality: N/A;   EYE SURGERY     bilateral cataracts removed and vitrectomies   JOINT REPLACEMENT Right    knee   KNEE ARTHROSCOPY     KYPHOPLASTY N/A 06/10/2020   Procedure: Thoracic Twelve, Lumbar One, Lumbar Two Kyphoplasty;  Surgeon: Gillie Duncans, MD;  Location: [facility] OR;  Service: Neurosurgery;  Laterality:  N/A;  3C/RM 21   KYPHOPLASTY N/A 09/07/2020   Procedure: Lumbar three KYPHOPLASTY;  Surgeon: Gillie Duncans, MD;  Location: [facility] OR;  Service: Neurosurgery;  Laterality: N/A;   LAMINECTOMY     LASER ABLATION Right 09/12/2023   Right leg laser ablation of the greater saphenous vein   LATERAL FUSION LUMBAR SPINE, TRANSVERSE     LEFT HEART CATH AND CORONARY ANGIOGRAPHY N/A 07/27/2022   Procedure: LEFT HEART CATH AND CORONARY ANGIOGRAPHY;  Surgeon: Claudene Pacific, MD;  Location: [facility] INVASIVE CV LAB;  Service: Cardiovascular;  Laterality: N/A;   LUMBAR LAMINECTOMY/DECOMPRESSION MICRODISCECTOMY Left 11/03/2012   Procedure: LUMBAR LAMINECTOMY/DECOMPRESSION MICRODISCECTOMY 1 LEVEL;  Surgeon: Lamar LELON Peaches, MD;  Location: [facility] NEURO ORS;  Service: Neurosurgery;  Laterality: Left;  LEFT L5S1 laminotomy foraminotomy and possible microdiskectomy   MELANOMA EXCISION     scalp   NASAL SINUS SURGERY     ORIF PATELLA Left 01/19/2020   Procedure: LEFT OPEN REDUCTION INTERNAL (ORIF) FIXATION PATELLA WITH AUTO GRAFTING USING HAMSTRING REINFORCEMENT;  Surgeon: Addie Cordella Hamilton, MD;  Location: [facility] OR;  Service: Orthopedics;  Laterality: Left;   POSTERIOR CERVICAL FUSION/FORAMINOTOMY Right 10/01/2012   Procedure: POSTERIOR CERVICAL FUSION/FORAMINOTOMY LEVEL 1;  Surgeon: Lamar LELON Peaches, MD;  Location: [facility] NEURO ORS;  Service: Neurosurgery;   Laterality: Right;  Right Cervical seven thoracic one Cervical laminectomy/foraminotomy with posterior cervical arthrodesis    POSTERIOR LUMBAR FUSION N/A 05/23/2013   Procedure: exploration of lumbar wound, explantation of left interbody implant.;  Surgeon: Lamar LELON Peaches, MD;  Location: [facility] OR;  Service: Neurosurgery;  Laterality: N/A;   RECTAL SURGERY     Fissure   TOTAL KNEE ARTHROPLASTY Left 05/07/2019   Procedure: LEFT TOTAL KNEE ARTHROPLASTY;  Surgeon: Addie Cordella Hamilton, MD;  Location: [facility] OR;  Service: Orthopedics;  Laterality: Left;   VITRECTOMY     right and left 2013   Social History   Occupational History   Occupation: Retired  Tobacco Use   Smoking status: Former    Current packs/day: 0.00    Average packs/day: 1 pack/day for 33.0 years (33.0 ttl pk-yrs)    Types: Cigarettes    Start date: 09/29/1948    Quit date: 09/29/1981    Years since quitting: 42.8   Smokeless tobacco: Never  Vaping Use   Vaping status: Never Used  Substance and Sexual Activity   Alcohol use: Yes    Alcohol/week: 21.0 standard drinks of alcohol    Types: 21 Shots of liquor per week    Comment: finishes 1/2L per week dark rum.- 3 shots of liquor a day   Drug use: No   Sexual activity: Not Currently    Partners: Female    Comment: Married

## 2024-08-24 ENCOUNTER — Ambulatory Visit: Admitting: Orthopedic Surgery

## 2024-09-16 ENCOUNTER — Observation Stay (HOSPITAL_COMMUNITY)
Admission: EM | Admit: 2024-09-16 | Source: Home / Self Care | Attending: Emergency Medicine | Admitting: Emergency Medicine

## 2024-09-16 ENCOUNTER — Emergency Department (HOSPITAL_COMMUNITY)

## 2024-09-16 ENCOUNTER — Other Ambulatory Visit: Payer: Self-pay

## 2024-09-16 ENCOUNTER — Encounter (HOSPITAL_COMMUNITY): Payer: Self-pay

## 2024-09-16 DIAGNOSIS — I2510 Atherosclerotic heart disease of native coronary artery without angina pectoris: Secondary | ICD-10-CM | POA: Diagnosis present

## 2024-09-16 DIAGNOSIS — I10 Essential (primary) hypertension: Secondary | ICD-10-CM | POA: Diagnosis present

## 2024-09-16 DIAGNOSIS — M5416 Radiculopathy, lumbar region: Secondary | ICD-10-CM

## 2024-09-16 DIAGNOSIS — D509 Iron deficiency anemia, unspecified: Secondary | ICD-10-CM | POA: Diagnosis present

## 2024-09-16 DIAGNOSIS — M109 Gout, unspecified: Secondary | ICD-10-CM | POA: Diagnosis present

## 2024-09-16 DIAGNOSIS — G8929 Other chronic pain: Secondary | ICD-10-CM | POA: Diagnosis not present

## 2024-09-16 DIAGNOSIS — I509 Heart failure, unspecified: Principal | ICD-10-CM

## 2024-09-16 DIAGNOSIS — I5023 Acute on chronic systolic (congestive) heart failure: Secondary | ICD-10-CM | POA: Diagnosis not present

## 2024-09-16 DIAGNOSIS — E785 Hyperlipidemia, unspecified: Secondary | ICD-10-CM | POA: Diagnosis present

## 2024-09-16 DIAGNOSIS — I5033 Acute on chronic diastolic (congestive) heart failure: Secondary | ICD-10-CM | POA: Diagnosis present

## 2024-09-16 DIAGNOSIS — I169 Hypertensive crisis, unspecified: Secondary | ICD-10-CM | POA: Diagnosis present

## 2024-09-16 DIAGNOSIS — Z86711 Personal history of pulmonary embolism: Secondary | ICD-10-CM | POA: Diagnosis not present

## 2024-09-16 DIAGNOSIS — E876 Hypokalemia: Secondary | ICD-10-CM | POA: Diagnosis present

## 2024-09-16 LAB — CBC WITH DIFFERENTIAL/PLATELET
Abs Immature Granulocytes: 0.02 10*3/uL (ref 0.00–0.07)
Basophils Absolute: 0.1 10*3/uL (ref 0.0–0.1)
Basophils Relative: 1 %
Eosinophils Absolute: 0.4 10*3/uL (ref 0.0–0.5)
Eosinophils Relative: 4 %
HCT: 34.7 % — ABNORMAL LOW (ref 39.0–52.0)
Hemoglobin: 10.5 g/dL — ABNORMAL LOW (ref 13.0–17.0)
Immature Granulocytes: 0 %
Lymphocytes Relative: 24 %
Lymphs Abs: 2.2 10*3/uL (ref 0.7–4.0)
MCH: 25.2 pg — ABNORMAL LOW (ref 26.0–34.0)
MCHC: 30.3 g/dL (ref 30.0–36.0)
MCV: 83.2 fL (ref 80.0–100.0)
Monocytes Absolute: 0.7 10*3/uL (ref 0.1–1.0)
Monocytes Relative: 7 %
Neutro Abs: 6 10*3/uL (ref 1.7–7.7)
Neutrophils Relative %: 64 %
Platelets: 526 10*3/uL — ABNORMAL HIGH (ref 150–400)
RBC: 4.17 MIL/uL — ABNORMAL LOW (ref 4.22–5.81)
RDW: 15.8 % — ABNORMAL HIGH (ref 11.5–15.5)
WBC: 9.3 10*3/uL (ref 4.0–10.5)
nRBC: 0 % (ref 0.0–0.2)

## 2024-09-16 LAB — TROPONIN T, HIGH SENSITIVITY
Troponin T High Sensitivity: 29 ng/L — ABNORMAL HIGH (ref 0–19)
Troponin T High Sensitivity: 29 ng/L — ABNORMAL HIGH (ref 0–19)

## 2024-09-16 LAB — COMPREHENSIVE METABOLIC PANEL WITH GFR
ALT: 7 U/L (ref 0–44)
AST: 21 U/L (ref 15–41)
Albumin: 3.8 g/dL (ref 3.5–5.0)
Alkaline Phosphatase: 91 U/L (ref 38–126)
Anion gap: 12 (ref 5–15)
BUN: 15 mg/dL (ref 8–23)
CO2: 22 mmol/L (ref 22–32)
Calcium: 8.9 mg/dL (ref 8.9–10.3)
Chloride: 105 mmol/L (ref 98–111)
Creatinine, Ser: 0.96 mg/dL (ref 0.61–1.24)
GFR, Estimated: >60 mL/min
Glucose, Bld: 104 mg/dL — ABNORMAL HIGH (ref 70–99)
Potassium: 3.7 mmol/L (ref 3.5–5.1)
Sodium: 139 mmol/L (ref 135–145)
Total Bilirubin: 0.6 mg/dL (ref 0.0–1.2)
Total Protein: 6.7 g/dL (ref 6.5–8.1)

## 2024-09-16 LAB — LIPASE, BLOOD: Lipase: 11 U/L (ref 11–51)

## 2024-09-16 LAB — PRO BRAIN NATRIURETIC PEPTIDE: Pro Brain Natriuretic Peptide: 2145 pg/mL — ABNORMAL HIGH

## 2024-09-16 MED ORDER — FUROSEMIDE 10 MG/ML IJ SOLN
40.0000 mg | Freq: Once | INTRAMUSCULAR | Status: AC
  Administered 2024-09-16: 40 mg via INTRAVENOUS
  Filled 2024-09-16: qty 4

## 2024-09-16 MED ORDER — LOSARTAN POTASSIUM 50 MG PO TABS
50.0000 mg | ORAL_TABLET | Freq: Every day | ORAL | Status: AC
  Administered 2024-09-17 – 2024-09-18 (×2): 50 mg via ORAL
  Filled 2024-09-16 (×2): qty 1

## 2024-09-16 MED ORDER — PROCHLORPERAZINE EDISYLATE 10 MG/2ML IJ SOLN: 5.0000 mg | Freq: Four times a day (QID) | INTRAMUSCULAR | Status: AC | PRN

## 2024-09-16 MED ORDER — FUROSEMIDE 10 MG/ML IJ SOLN
40.0000 mg | Freq: Two times a day (BID) | INTRAMUSCULAR | Status: DC
  Administered 2024-09-17 – 2024-09-18 (×3): 40 mg via INTRAVENOUS
  Filled 2024-09-16 (×3): qty 4

## 2024-09-16 MED ORDER — HYDRALAZINE HCL 25 MG PO TABS: 25.0000 mg | ORAL_TABLET | Freq: Four times a day (QID) | ORAL | Status: DC | PRN

## 2024-09-16 MED ORDER — ISOSORBIDE MONONITRATE ER 30 MG PO TB24
30.0000 mg | ORAL_TABLET | Freq: Every day | ORAL | Status: AC
  Administered 2024-09-17 – 2024-09-18 (×2): 30 mg via ORAL
  Filled 2024-09-16 (×2): qty 1

## 2024-09-16 MED ORDER — SENNOSIDES-DOCUSATE SODIUM 8.6-50 MG PO TABS
1.0000 | ORAL_TABLET | Freq: Every evening | ORAL | Status: AC | PRN
  Administered 2024-09-18: 1 via ORAL
  Filled 2024-09-16: qty 1

## 2024-09-16 MED ORDER — ACETAMINOPHEN 325 MG PO TABS: 650.0000 mg | ORAL_TABLET | Freq: Four times a day (QID) | ORAL | Status: AC | PRN

## 2024-09-16 MED ORDER — METOPROLOL SUCCINATE ER 50 MG PO TB24
50.0000 mg | ORAL_TABLET | Freq: Every day | ORAL | Status: AC
  Administered 2024-09-17 – 2024-09-18 (×2): 50 mg via ORAL
  Filled 2024-09-16: qty 1
  Filled 2024-09-16: qty 2

## 2024-09-16 MED ORDER — TAMSULOSIN HCL 0.4 MG PO CAPS
0.4000 mg | ORAL_CAPSULE | Freq: Every day | ORAL | Status: AC
  Administered 2024-09-17 – 2024-09-18 (×2): 0.4 mg via ORAL
  Filled 2024-09-16 (×2): qty 1

## 2024-09-16 MED ORDER — OXYCODONE HCL 5 MG PO TABS
10.0000 mg | ORAL_TABLET | Freq: Three times a day (TID) | ORAL | Status: AC | PRN
  Administered 2024-09-17 – 2024-09-18 (×5): 20 mg via ORAL
  Filled 2024-09-16 (×5): qty 4

## 2024-09-16 MED ORDER — SODIUM CHLORIDE 0.9% FLUSH
3.0000 mL | Freq: Two times a day (BID) | INTRAVENOUS | Status: AC
  Administered 2024-09-16 – 2024-09-18 (×5): 3 mL via INTRAVENOUS

## 2024-09-16 MED ORDER — ACETAMINOPHEN 650 MG RE SUPP: 650.0000 mg | Freq: Four times a day (QID) | RECTAL | Status: AC | PRN

## 2024-09-16 MED ORDER — APIXABAN 5 MG PO TABS
5.0000 mg | ORAL_TABLET | Freq: Two times a day (BID) | ORAL | Status: AC
  Administered 2024-09-16 – 2024-09-18 (×5): 5 mg via ORAL
  Filled 2024-09-16 (×5): qty 1

## 2024-09-16 MED ORDER — SODIUM CHLORIDE 0.9 % IV BOLUS: 1000.0000 mL | Freq: Once | INTRAVENOUS | Status: DC

## 2024-09-16 NOTE — ED Provider Notes (Signed)
 " [place] EMERGENCY DEPARTMENT AT [facility] Provider Note   CSN: [id number] Arrival date & time: 09/16/24  1503     Patient presents with: Chest Pain   [name] is a 89 y.o. male.   Pt complains of his blood pressure being high today.  Pt reports his legs are swollen.  Pt states he took 2 dosages of his medication and his blood pressure remained high.    The history is provided by the patient. The history is limited by a language barrier. No language interpreter was used.  Hypertension This is a recurrent problem. The current episode started 12 to 24 hours ago. The problem occurs constantly. The problem has been gradually worsening. Pertinent negatives include no chest pain and no shortness of breath. Nothing aggravates the symptoms. Nothing relieves the symptoms. He has tried nothing for the symptoms. The treatment provided no relief.       Prior to Admission medications  Medication Sig Start Date End Date Taking? Authorizing Provider  apixaban  (ELIQUIS ) 5 MG TABS tablet TAKE 1 TABLET BY MOUTH TWICE A DAY 02/03/24   Rollene Almarie LABOR, MD  aspirin  EC 81 MG tablet Take 1 tablet (81 mg total) by mouth daily. Swallow whole. 06/22/22   Bryn Bernardino NOVAK, MD  clotrimazole  (LOTRIMIN ) 1 % cream APPLY TO AFFECTED AREA TWICE A DAY Patient taking differently: Apply 1 Application topically 2 (two) times daily as needed (for dryness). 06/13/22   Geofm Glade PARAS, MD  fluticasone  (FLONASE ) 50 MCG/ACT nasal spray Place 1 spray into both nostrils daily as needed for allergies.    [provider]  furosemide  (LASIX ) 20 MG tablet TAKE 1 TABLET BY MOUTH EVERY DAY 08/12/23   Rollene Almarie LABOR, MD  isosorbide  mononitrate (IMDUR ) 30 MG 24 hr tablet TAKE 1 TABLET BY MOUTH EVERY DAY 08/05/23   Rollene Almarie LABOR, MD  lidocaine  4 % Place 1 patch onto the skin daily. 04/22/23   Melvenia Bernardino, MD  losartan  (COZAAR ) 50 MG tablet TAKE 1 TABLET BY MOUTH EVERY DAY 08/05/23   Rollene Almarie LABOR, MD  metoprolol  succinate (TOPROL -XL) 50 MG 24 hr tablet TAKE 1 TABLET BY MOUTH EVERY DAY 03/01/23   Rollene Almarie LABOR, MD  Oxycodone  HCl 10 MG TABS Take 10-20 mg by mouth See admin instructions. Take 20 mg by mouth in the morning and evening. May take an additional 10 mg once a day around noontime as needed for unresolved pain.    [provider]  potassium chloride  SA (KLOR-CON  M) 20 MEQ tablet Take 1 tablet (20 mEq total) by mouth 2 (two) times daily for 2 days. 04/22/23 07/23/24  Melvenia Bernardino, MD  SYMPROIC  0.2 MG TABS Take 0.2 mg by mouth in the morning.    [provider]  tamsulosin  (FLOMAX ) 0.4 MG CAPS capsule TAKE 1 CAPSULE BY MOUTH EVERY DAY 05/09/23   Rollene Almarie LABOR, MD  triamcinolone  cream (KENALOG ) 0.1 % Apply 1 Application topically 2 (two) times daily as needed (rough/irritated skin). 12/18/22   [provider]    Allergies: Amlodipine , Lyrica  [pregabalin ], and Gabapentin     Review of Systems  Respiratory:  Negative for shortness of breath.   Cardiovascular:  Negative for chest pain.  All other systems reviewed and are negative.   Updated Vital Signs BP (!) 153/130   Pulse 67   Temp 97.9 F (36.6 C)   Resp 18   SpO2 90%   Physical Exam Vitals reviewed.  Constitutional:  Appearance: He is well-developed.  Eyes:     Pupils: Pupils are equal, round, and reactive to light.  Cardiovascular:     Rate and Rhythm: Normal rate and regular rhythm.     Heart sounds: Normal heart sounds.  Pulmonary:     Effort: Pulmonary effort is normal.     Breath sounds: Normal breath sounds.  Abdominal:     General: Bowel sounds are normal.     Palpations: Abdomen is soft.  Musculoskeletal:        General: Normal range of motion.  Neurological:     General: No focal deficit present.     Mental Status: He is alert.  Psychiatric:        Mood and Affect: Mood normal.     (all labs ordered are listed, but only abnormal results are  displayed) Labs Reviewed  CBC WITH DIFFERENTIAL/PLATELET - Abnormal; Notable for the following components:      Result Value   RBC 4.17 (*)    Hemoglobin 10.5 (*)    HCT 34.7 (*)    MCH 25.2 (*)    RDW 15.8 (*)    Platelets 526 (*)    All other components within normal limits  COMPREHENSIVE METABOLIC PANEL WITH GFR - Abnormal; Notable for the following components:   Glucose, Bld 104 (*)    All other components within normal limits  PRO BRAIN NATRIURETIC PEPTIDE - Abnormal; Notable for the following components:   Pro Brain Natriuretic Peptide 2,145.0 (*)    All other components within normal limits  TROPONIN T, HIGH SENSITIVITY - Abnormal; Notable for the following components:   Troponin T High Sensitivity 29 (*)    All other components within normal limits  TROPONIN T, HIGH SENSITIVITY - Abnormal; Notable for the following components:   Troponin T High Sensitivity 29 (*)    All other components within normal limits  LIPASE, BLOOD    EKG: None  Radiology: DG Chest 2 View Result Date: 09/16/2024 CLINICAL DATA:  Chest pain. EXAM: CHEST - 2 VIEW COMPARISON:  Chest CT 04/22/2023 FINDINGS: Cardiomegaly is stable. Mediastinal contours are unchanged. Aortic atherosclerosis. Small bilateral pleural effusions, left greater than right. Vascular congestion. No confluent opacity. No pneumothorax. Chronic shoulder arthropathy. IMPRESSION: Cardiomegaly with vascular congestion and small bilateral pleural effusions, left greater than right. Electronically Signed   By: Andrea Gasman M.D.   On: 09/16/2024 16:59     Procedures   Medications Ordered in the ED - No data to display                                  Medical Decision Making Patient complains of elevated blood pressure.  Patient reports he does have swelling in his legs.  He is unsure if he has had congestive heart failure.  His record does show a history of congestive heart failure.  He is no longer taking Lasix .  Patient states  his blood pressure was high today.  Amount and/or Complexity of Data Reviewed Labs: ordered. Decision-making details documented in ED Course.    Details: Labs ordered reviewed and interpreted patient has a slightly elevated troponin of 29.  BNP is 2145. Radiology: ordered and independent interpretation performed. Decision-making details documented in ED Course.    Details: Chest x-ray ordered reviewed and interpreted.  Chest x-ray shows cardiomegaly with vascular congestion ECG/medicine tests: ordered and independent interpretation performed. Decision-making details documented in ED Course.  Details: EKG shows normal sinus nonspecific EKG changes. Discussion of management or test interpretation with external provider(s): Hospitalist consulted.  Dr. Charlton will admit   Risk Prescription drug management. Decision regarding hospitalization. Risk Details: Patient is counseled on congestive heart failure.  Patient is given Lasix  IV.        Final diagnoses:  Congestive heart failure, unspecified HF chronicity, unspecified heart failure type [facility])    ED Discharge Orders     None          Flint Sonny POUR, [place] 09/16/24 2223  "

## 2024-09-16 NOTE — ED Triage Notes (Signed)
 Pt BIB [facility] from home d/t waking up at 0530 this morning with chest tightness, then took his morning mesds twice & began monitoring his own BP at home & was getting 200/100 & 180/100, reports his chest discomfort stopped approx 20 min later. A/Ox4, 12L good, all other VSS, 20g Lt AC PIV

## 2024-09-16 NOTE — ED Provider Triage Note (Signed)
 Emergency Medicine Provider Triage Evaluation Note  [name] , a 89 y.o. male  was evaluated in triage.  Pt complains of chest pressure and pain that started this morning when he woke up. Reports he took his blood pressure and it was 200/100, he does not know where his blood pressure normally runs. He reports the pain/pressure resolved prior to arrival to the ER. No pain with exertion or any pleuritic pain. No pain that radiates to the back. He reports he feels well currenty  Review of Systems  Positive: As above Negative: As above  Physical Exam  BP (!) 181/75 (BP Location: Right Arm)   Pulse 63   Temp 98.8 F (37.1 C)   Resp 18   SpO2 96%  Gen:   Awake, no distress   Resp:  Normal effort  MSK:   Moves extremities without difficulty    Medical Decision Making  Medically screening exam initiated at 3:21 PM.  Appropriate orders placed.  JEET SHOUGH was informed that the remainder of the evaluation will be completed by another provider, this initial triage assessment does not replace that evaluation, and the importance of remaining in the ED until their evaluation is complete.     Veta Palma, PA-C 09/16/24 1521

## 2024-09-16 NOTE — H&P (Signed)
 " History and Physical    [name] FMW:[id number] DOB: [birth year] DOA: 09/16/2024  PCP: Ileen Rosaline NOVAK, NP   Patient coming from: Home   Chief Complaint: Chest tightness, SOB, high BP   HPI: [name] is a 89 y.o. male with medical history significant for hypertension, chronic HFrEF, history of PE on Eliquis , and chronic back pain who presents with chest tightness, SOB, and elevated blood pressure.  Patient reports that he went to bed last night in his usual state of health, though he notes that he did eat later and more than usual.  When he woke at roughly 6 AM this morning, he was experiencing a sensation of tightness in his chest and shortness of breath.  He got up and took his blood pressure and found it to be 200/100.    He took his morning medications with some improvement in blood pressure, but blood pressure rose again and he took an extra dose of his antihypertensives.  The chest discomfort and dyspnea eventually eased off but he continued to have elevated blood pressure readings at home, prompting his presentation to the ED.  He has not noticed any change in his chronic leg swelling and does not have any chest discomfort in the ED.  ED Course: Upon arrival to the ED, patient is found to be afebrile and saturating low 90s on room air with normal HR and elevated BP.  Labs are most notable for normal CMP, hemoglobin 10.5, platelets 526,000, troponin 29, and proBNP 2145.  Chest x-ray demonstrates cardiomegaly with vascular congestion and small bilateral pleural effusions.  Patient was treated with 40 mg IV Lasix  in the ED.  Review of Systems:  All other systems reviewed and apart from HPI, are negative.  Past Medical History:  Diagnosis Date   ACTINIC KERATOSIS, HEAD 03/23/2008   ALLERGIC RHINITIS 03/27/2007   Arthritis    BENIGN PROSTATIC HYPERTROPHY, WITH URINARY OBSTRUCTION 10/16/2007   CAD 08/18/2007   patient denies any issues with heart, does not see cardiologist    Carpal tunnel syndrome, bilateral 09/04/2016   CARPAL TUNNEL SYNDROME, LEFT 04/13/2008   CHF (congestive heart failure) (HCC)    Chronic back pain    Chronic neck pain    Complication of anesthesia    left lower jaw teeth have been injured in the past and he has lost those, now has 2 loose front teeth due to a mouth injury on 01/02/20   DVT (deep venous thrombosis) (HCC)    ERECTILE DYSFUNCTION, ORGANIC 02/20/2010   Gout, unspecified 02/20/2010   Headache(784.0)    migraines hx of- started at age 79- none since 11   History of melanoma    HYPERLIPIDEMIA, WITH HIGH HDL 10/18/2008   HYPERTENSION 05/09/2007   sees Dr. Norleen Budge   KNEE PAIN 10/10/2009   LOW BACK PAIN 03/27/2007   NEOPLASM, MALIGNANT, SKIN, TRUNK 05/09/2007   squamous cell on scalp, sideburn and ear (right). meyloma on truckl.   Patellar tendinitis 10/10/2009   Sciatic nerve pain    on Oxycodone  as needed   SKIN CANCER, HX OF 03/27/2007   UNS ADVRS EFF UNS RX MEDICINAL&BIOLOGICAL SBSTNC 08/18/2007   Urinary urgency     Past Surgical History:  Procedure Laterality Date   APPENDECTOMY  1969   APPLICATION OF WOUND VAC Left 01/19/2020   Procedure: Application Of Wound Vac;  Surgeon: Addie Cordella Hamilton, MD;  Location: [facility] OR;  Service: Orthopedics;  Laterality: Left;   BLEPHAROPLASTY  CARPAL TUNNEL RELEASE Right    CARPAL TUNNEL RELEASE Left 12/27/2022   Procedure: CARPAL TUNNEL RELEASE WITH GANGLION CYST EXCISION;  Surgeon: Addie Cordella Hamilton, MD;  Location: [facility] OR;  Service: Orthopedics;  Laterality: Left;   CHOLECYSTECTOMY     ESOPHAGOGASTRODUODENOSCOPY (EGD) WITH PROPOFOL  N/A 04/26/2015   Procedure: ESOPHAGOGASTRODUODENOSCOPY (EGD) WITH PROPOFOL ;  Surgeon: Lamar JONETTA Aho, MD;  Location: [facility] ENDOSCOPY;  Service: Endoscopy;  Laterality: N/A;   EYE SURGERY     bilateral cataracts removed and vitrectomies   JOINT REPLACEMENT Right    knee   KNEE ARTHROSCOPY     KYPHOPLASTY N/A 06/10/2020   Procedure: Thoracic Twelve,  Lumbar One, Lumbar Two Kyphoplasty;  Surgeon: Gillie Duncans, MD;  Location: [facility] OR;  Service: Neurosurgery;  Laterality: N/A;  3C/RM 21   KYPHOPLASTY N/A 09/07/2020   Procedure: Lumbar three KYPHOPLASTY;  Surgeon: Gillie Duncans, MD;  Location: [facility] OR;  Service: Neurosurgery;  Laterality: N/A;   LAMINECTOMY     LASER ABLATION Right 09/12/2023   Right leg laser ablation of the greater saphenous vein   LATERAL FUSION LUMBAR SPINE, TRANSVERSE     LEFT HEART CATH AND CORONARY ANGIOGRAPHY N/A 07/27/2022   Procedure: LEFT HEART CATH AND CORONARY ANGIOGRAPHY;  Surgeon: Claudene Pacific, MD;  Location: [facility] INVASIVE CV LAB;  Service: Cardiovascular;  Laterality: N/A;   LUMBAR LAMINECTOMY/DECOMPRESSION MICRODISCECTOMY Left 11/03/2012   Procedure: LUMBAR LAMINECTOMY/DECOMPRESSION MICRODISCECTOMY 1 LEVEL;  Surgeon: Lamar LELON Peaches, MD;  Location: [facility] NEURO ORS;  Service: Neurosurgery;  Laterality: Left;  LEFT L5S1 laminotomy foraminotomy and possible microdiskectomy   MELANOMA EXCISION     scalp   NASAL SINUS SURGERY     ORIF PATELLA Left 01/19/2020   Procedure: LEFT OPEN REDUCTION INTERNAL (ORIF) FIXATION PATELLA WITH AUTO GRAFTING USING HAMSTRING REINFORCEMENT;  Surgeon: Addie Cordella Hamilton, MD;  Location: [facility] OR;  Service: Orthopedics;  Laterality: Left;   POSTERIOR CERVICAL FUSION/FORAMINOTOMY Right 10/01/2012   Procedure: POSTERIOR CERVICAL FUSION/FORAMINOTOMY LEVEL 1;  Surgeon: Lamar LELON Peaches, MD;  Location: [facility] NEURO ORS;  Service: Neurosurgery;  Laterality: Right;  Right Cervical seven thoracic one Cervical laminectomy/foraminotomy with posterior cervical arthrodesis    POSTERIOR LUMBAR FUSION N/A 05/23/2013   Procedure: exploration of lumbar wound, explantation of left interbody implant.;  Surgeon: Lamar LELON Peaches, MD;  Location: [facility] OR;  Service: Neurosurgery;  Laterality: N/A;   RECTAL SURGERY     Fissure   TOTAL KNEE ARTHROPLASTY Left 05/07/2019   Procedure: LEFT TOTAL KNEE ARTHROPLASTY;  Surgeon:  Addie Cordella Hamilton, MD;  Location: [facility] OR;  Service: Orthopedics;  Laterality: Left;   VITRECTOMY     right and left 2013    Social History:   reports that he quit smoking about 42 years ago. His smoking use included cigarettes. He started smoking about 76 years ago. He has a 33 pack-year smoking history. He has never used smokeless tobacco. He reports current alcohol use of about 21.0 standard drinks of alcohol per week. He reports that he does not use drugs.  Allergies[1]  Family History  Problem Relation Age of Onset   Dementia Mother    Anemia Father    Heart disease Father    Stroke Sister        ICH   Lung disease Sister      Prior to Admission medications  Medication Sig Start Date End Date Taking? Authorizing Provider  apixaban  (ELIQUIS ) 5 MG TABS tablet TAKE 1 TABLET BY MOUTH TWICE A DAY 02/03/24   Rollene Norris  A, MD  aspirin  EC 81 MG tablet Take 1 tablet (81 mg total) by mouth daily. Swallow whole. 06/22/22   Bryn Bernardino NOVAK, MD  clotrimazole  (LOTRIMIN ) 1 % cream APPLY TO AFFECTED AREA TWICE A DAY Patient taking differently: Apply 1 Application topically 2 (two) times daily as needed (for dryness). 06/13/22   Geofm Glade PARAS, MD  fluticasone  (FLONASE ) 50 MCG/ACT nasal spray Place 1 spray into both nostrils daily as needed for allergies.    [provider]  furosemide  (LASIX ) 20 MG tablet TAKE 1 TABLET BY MOUTH EVERY DAY 08/12/23   Rollene Almarie LABOR, MD  isosorbide  mononitrate (IMDUR ) 30 MG 24 hr tablet TAKE 1 TABLET BY MOUTH EVERY DAY 08/05/23   Rollene Almarie LABOR, MD  lidocaine  4 % Place 1 patch onto the skin daily. 04/22/23   Melvenia Bernardino, MD  losartan  (COZAAR ) 50 MG tablet TAKE 1 TABLET BY MOUTH EVERY DAY 08/05/23   Rollene Almarie LABOR, MD  metoprolol  succinate (TOPROL -XL) 50 MG 24 hr tablet TAKE 1 TABLET BY MOUTH EVERY DAY 03/01/23   Rollene Almarie LABOR, MD  Oxycodone  HCl 10 MG TABS Take 10-20 mg by mouth See admin instructions. Take 20 mg by mouth in  the morning and evening. May take an additional 10 mg once a day around noontime as needed for unresolved pain.    [provider]  potassium chloride  SA (KLOR-CON  M) 20 MEQ tablet Take 1 tablet (20 mEq total) by mouth 2 (two) times daily for 2 days. 04/22/23 07/23/24  Melvenia Bernardino, MD  SYMPROIC  0.2 MG TABS Take 0.2 mg by mouth in the morning.    [provider]  tamsulosin  (FLOMAX ) 0.4 MG CAPS capsule TAKE 1 CAPSULE BY MOUTH EVERY DAY 05/09/23   Rollene Almarie LABOR, MD  triamcinolone  cream (KENALOG ) 0.1 % Apply 1 Application topically 2 (two) times daily as needed (rough/irritated skin). 12/18/22   [provider]    Physical Exam: Vitals:   09/16/24 1506 09/16/24 1902  BP: (!) 181/75 (!) 153/130  Pulse: 63 67  Resp: 18 18  Temp: 98.8 F (37.1 C) 97.9 F (36.6 C)  SpO2: 96% 90%    Constitutional: NAD, no pallor or diaphoresis   Eyes: PERTLA, lids and conjunctivae normal ENMT: Mucous membranes are moist. Posterior pharynx clear of any exudate or lesions.   Neck: supple, no masses  Respiratory: No wheezing, no crackles. No accessory muscle use.  Cardiovascular: S1 & S2 heard, regular rate and rhythm. Bilateral lower extremity edema.  Abdomen: No tenderness, soft. Bowel sounds active.  Musculoskeletal: no clubbing / cyanosis. No joint deformity upper and lower extremities.   Skin: no significant rashes, lesions, ulcers. Warm, dry, well-perfused. Neurologic: CN 2-12 grossly intact. Moving all extremities. Alert and oriented to person, place, and situation.  Psychiatric: Calm. Cooperative.    Labs and Imaging on Admission: I have personally reviewed following labs and imaging studies  CBC: Recent Labs  Lab 09/16/24 1552  WBC 9.3  NEUTROABS 6.0  HGB 10.5*  HCT 34.7*  MCV 83.2  PLT 526*   Basic Metabolic Panel: Recent Labs  Lab 09/16/24 1552  NA 139  K 3.7  CL 105  CO2 22  GLUCOSE 104*  BUN 15  CREATININE 0.96  CALCIUM  8.9   GFR: CrCl cannot  be calculated (Unknown ideal weight.). Liver Function Tests: Recent Labs  Lab 09/16/24 1552  AST 21  ALT 7  ALKPHOS 91  BILITOT 0.6  PROT 6.7  ALBUMIN  3.8   Recent  Labs  Lab 09/16/24 1552  LIPASE 11   No results for input(s): AMMONIA in the last 168 hours. Coagulation Profile: No results for input(s): INR, PROTIME in the last 168 hours. Cardiac Enzymes: No results for input(s): CKTOTAL, CKMB, CKMBINDEX, TROPONINI in the last 168 hours. BNP (last 3 results) Recent Labs    09/16/24 1552  PROBNP 2,145.0*   HbA1C: No results for input(s): HGBA1C in the last 72 hours. CBG: No results for input(s): GLUCAP in the last 168 hours. Lipid Profile: No results for input(s): CHOL, HDL, LDLCALC, TRIG, CHOLHDL, LDLDIRECT in the last 72 hours. Thyroid  Function Tests: No results for input(s): TSH, T4TOTAL, FREET4, T3FREE, THYROIDAB in the last 72 hours. Anemia Panel: No results for input(s): VITAMINB12, FOLATE, FERRITIN, TIBC, IRON, RETICCTPCT in the last 72 hours. Urine analysis:    Component Value Date/Time   COLORURINE STRAW (A) 04/22/2023 0806   APPEARANCEUR CLEAR 04/22/2023 0806   LABSPEC 1.009 04/22/2023 0806   PHURINE 7.0 04/22/2023 0806   GLUCOSEU NEGATIVE 04/22/2023 0806   GLUCOSEU NEGATIVE 10/03/2018 1036   HGBUR MODERATE (A) 04/22/2023 0806   BILIRUBINUR NEGATIVE 04/22/2023 0806   BILIRUBINUR negative 10/17/2018 1555   KETONESUR 20 (A) 04/22/2023 0806   PROTEINUR NEGATIVE 04/22/2023 0806   UROBILINOGEN 0.2 10/17/2018 1555   UROBILINOGEN 0.2 10/03/2018 1036   NITRITE NEGATIVE 04/22/2023 0806   LEUKOCYTESUR NEGATIVE 04/22/2023 0806   Sepsis Labs: @LABRCNTIP (procalcitonin:4,lacticidven:4) )No results found for this or any previous visit (from the past 240 hours).   Radiological Exams on Admission: DG Chest 2 View Result Date: 09/16/2024 CLINICAL DATA:  Chest pain. EXAM: CHEST - 2 VIEW COMPARISON:  Chest CT  04/22/2023 FINDINGS: Cardiomegaly is stable. Mediastinal contours are unchanged. Aortic atherosclerosis. Small bilateral pleural effusions, left greater than right. Vascular congestion. No confluent opacity. No pneumothorax. Chronic shoulder arthropathy. IMPRESSION: Cardiomegaly with vascular congestion and small bilateral pleural effusions, left greater than right. Electronically Signed   By: Andrea Gasman M.D.   On: 09/16/2024 16:59    EKG: Independently reviewed. Undetermined rhythm, rate 64.   Assessment/Plan   1. Acute on chronic HFrEF  - EF was 40% on echo from November 2023  - Continue diuresis with IV Lasix , continue ARB and beta-blocker, update echo, monitor weight and I/Os    2. Hypertensive crisis  - Anticipate improvement with diuresis  - Continue diuresis, losartan , and metoprolol , use additional agents as needed    3. Hx of PE  - Continue Eliquis     4. Chronic pain  - Prescription database reviewed, plan to continue oxycodone     DVT prophylaxis: Eliquis   Code Status: DNR/DNI  Level of Care: Level of care: Telemetry Family Communication: None present   Disposition Plan:  Patient is from: Home  Anticipated d/c is to: TBD Anticipated d/c date is: 2/5 or 09/18/24 Patient currently: Pending improved volume status, stable BP, echocardiogram  Consults called: None  Admission status: Observation     Evalene GORMAN Sprinkles, MD [facility]  09/16/2024, 9:42 PM       [1]  Allergies Allergen Reactions   Amlodipine  Swelling and Other (See Comments)    Ankle swelling   Lyrica  [Pregabalin ] Swelling and Other (See Comments)    Hands and feet swell   Gabapentin  Other (See Comments)    Lower extremity edema- knees and feet   "

## 2024-09-17 ENCOUNTER — Observation Stay (HOSPITAL_COMMUNITY)

## 2024-09-17 DIAGNOSIS — I5023 Acute on chronic systolic (congestive) heart failure: Secondary | ICD-10-CM | POA: Insufficient documentation

## 2024-09-17 DIAGNOSIS — I5033 Acute on chronic diastolic (congestive) heart failure: Secondary | ICD-10-CM

## 2024-09-17 LAB — BASIC METABOLIC PANEL WITH GFR
Anion gap: 10 (ref 5–15)
BUN: 14 mg/dL (ref 8–23)
CO2: 25 mmol/L (ref 22–32)
Calcium: 9 mg/dL (ref 8.9–10.3)
Chloride: 106 mmol/L (ref 98–111)
Creatinine, Ser: 1.03 mg/dL (ref 0.61–1.24)
GFR, Estimated: >60 mL/min
Glucose, Bld: 94 mg/dL (ref 70–99)
Potassium: 3.4 mmol/L — ABNORMAL LOW (ref 3.5–5.1)
Sodium: 141 mmol/L (ref 135–145)

## 2024-09-17 LAB — CBC
HCT: 32.7 % — ABNORMAL LOW (ref 39.0–52.0)
Hemoglobin: 10.2 g/dL — ABNORMAL LOW (ref 13.0–17.0)
MCH: 25.5 pg — ABNORMAL LOW (ref 26.0–34.0)
MCHC: 31.2 g/dL (ref 30.0–36.0)
MCV: 81.8 fL (ref 80.0–100.0)
Platelets: 475 10*3/uL — ABNORMAL HIGH (ref 150–400)
RBC: 4 MIL/uL — ABNORMAL LOW (ref 4.22–5.81)
RDW: 15.6 % — ABNORMAL HIGH (ref 11.5–15.5)
WBC: 8.9 10*3/uL (ref 4.0–10.5)
nRBC: 0 % (ref 0.0–0.2)

## 2024-09-17 LAB — ECHOCARDIOGRAM COMPLETE
AR max vel: 2.12 cm2
AV Area VTI: 2.25 cm2
AV Area mean vel: 2.1 cm2
AV Mean grad: 4 mmHg
AV Peak grad: 6.8 mmHg
Ao pk vel: 1.3 m/s
Area-P 1/2: 3.83 cm2
S' Lateral: 3.3 cm

## 2024-09-17 LAB — MAGNESIUM: Magnesium: 1.7 mg/dL (ref 1.7–2.4)

## 2024-09-17 MED ORDER — MAGNESIUM SULFATE 2 GM/50ML IV SOLN
2.0000 g | Freq: Once | INTRAVENOUS | Status: AC
  Administered 2024-09-17: 2 g via INTRAVENOUS
  Filled 2024-09-17: qty 50

## 2024-09-17 MED ORDER — POTASSIUM CHLORIDE CRYS ER 20 MEQ PO TBCR
40.0000 meq | EXTENDED_RELEASE_TABLET | ORAL | Status: AC
  Administered 2024-09-17 (×2): 40 meq via ORAL
  Filled 2024-09-17 (×2): qty 2

## 2024-09-17 MED ORDER — EMPAGLIFLOZIN 10 MG PO TABS
10.0000 mg | ORAL_TABLET | Freq: Every day | ORAL | Status: AC
  Administered 2024-09-18: 10 mg via ORAL
  Filled 2024-09-17 (×2): qty 1

## 2024-09-17 MED ORDER — FLUTICASONE PROPIONATE 50 MCG/ACT NA SUSP
2.0000 | Freq: Two times a day (BID) | NASAL | Status: AC
  Administered 2024-09-17 – 2024-09-18 (×3): 2 via NASAL
  Filled 2024-09-17 (×2): qty 16

## 2024-09-17 MED ORDER — SPIRONOLACTONE 12.5 MG HALF TABLET
12.5000 mg | ORAL_TABLET | Freq: Every day | ORAL | Status: AC
  Administered 2024-09-17 – 2024-09-18 (×2): 12.5 mg via ORAL
  Filled 2024-09-17 (×2): qty 1

## 2024-09-17 MED ORDER — PERFLUTREN LIPID MICROSPHERE
1.0000 mL | INTRAVENOUS | Status: AC | PRN
  Administered 2024-09-17: 3 mL via INTRAVENOUS

## 2024-09-17 NOTE — Assessment & Plan Note (Addendum)
 Uncontrolled hypertension   Plan to continue diuresis with furosemide  Add spironolactone  and SGLT 2 inh Continue with losartan  and metoprolol 

## 2024-09-17 NOTE — ED Notes (Signed)
 Pt placed on hospital bed for comfort.

## 2024-09-17 NOTE — Assessment & Plan Note (Signed)
 Continue with statin therapy

## 2024-09-17 NOTE — Assessment & Plan Note (Signed)
 Renal function with serum cr at 1,0 with K at 3.4 and serum bicarbonate at 25 Na 141 Mg 1,7   Plan to continue diuresis Add Kcl 40 meq x2 and 2 g Mg sulfate, to keep K at 4 and Mg at 2 Follow up renal function and electrolytes in am,.

## 2024-09-17 NOTE — Assessment & Plan Note (Signed)
 Continue analgesics

## 2024-09-17 NOTE — Assessment & Plan Note (Addendum)
 Echocardiogram pending, study from 2023 with mild reduction in systolic function with LV 40%, mild  LVH, fusion EA, no significant valvular dysfunction  Continue volume overloaded.   Plan to continue diuresis with 40 mg IV bid, continue losartan  and metoprolol  succinate  Add spironolactone  and SGLT 2 inh

## 2024-09-17 NOTE — Progress Notes (Addendum)
 " Progress Note   Patient: [name] FMW:[id number] DOB: 04-22-1932 DOA: 09/16/2024     0 DOS: the patient was seen and examined on 09/17/2024   Brief hospital course: [name] was admitted to the hospital with the working diagnosis of heart failure exacerbation.   89 yo male with the past medical history of hypertension, heart failure, pulmonary embolism, and chronic back pain who presented with dyspnea.  Patient had acute episode of dyspnea and chest pressure that woke him up from his sleep, at that time his blood pressure was 200/100. His blood pressure remained high despite taking his medications, prompting him to call EMS. He was transported to the ED.  In the ED his blood pressure was 153/130, HR 67, RR 18 and 02 saturation was 90% Lungs with no wheezing or rales, heart with S1 and S2 present regular rhythm, abdomen was not distended, positive lower extremity edema.   Na 139, K 3,7 Cl 105 bicarbonate 22 glucose 104 bun 15 cr 0,96  Pro BNP 2,145  High sensitive troponin 29 and 29 Wbc 9,3 hgb 10.5 plt 526   Chest radiograph with cardiomegaly, bilateral pleural effusions, more left than right, with fluid in the right fissure.   EKG 64 bpm, normal axis, normal intervals, qtc 472, sinus rhythm with no significant ST segment or T wave changes.    Assessment and Plan: * Acute on chronic diastolic CHF (congestive heart failure) (HCC) Echocardiogram pending, study from 2023 with mild reduction in systolic function with LV 40%, mild  LVH, fusion EA, no significant valvular dysfunction  Continue volume overloaded.   Plan to continue diuresis with 40 mg IV bid, continue losartan  and metoprolol  succinate  Add spironolactone  and SGLT 2 inh   CAD (coronary artery disease) Continue isosorbide  and statin, no chest pain, no acute coronary syndrome   Essential hypertension Uncontrolled hypertension   Plan to continue diuresis with furosemide  Add spironolactone  and SGLT 2 inh Continue  with losartan  and metoprolol   Hypokalemia Renal function with serum cr at 1,0 with K at 3.4 and serum bicarbonate at 25 Na 141 Mg 1,7   Plan to continue diuresis Add Kcl 40 meq x2 and 2 g Mg sulfate, to keep K at 4 and Mg at 2 Follow up renal function and electrolytes in am,.    Hyperlipidemia Continue with statin therapy   Gout No acute attack   Iron deficiency anemia Cell count stable   Hx pulmonary embolism Continue anticoagulation with apixaban   Chronic radicular low back pain Continue analgesics    Subjective: Patient is feeling better, edema is improving, he has no longer PND or orthopnea  Physical Exam: Vitals:   09/17/24 0315 09/17/24 0615 09/17/24 0700 09/17/24 0736  BP: (!) 164/97 (!) 114/51 109/62   Pulse: 63 60 66   Resp: (!) 22 14 12    Temp: 97.8 F (36.6 C)   97.6 F (36.4 C)  TempSrc: Oral     SpO2: 97% 93% 93%    Neurology awake and alert ENT with mild pallor with no icterus Cardiovascular with S1 and S2 present and regular with no gallops or rubs,positive systolic murmur at the base Respiratory with mild rales at bases with no wheezing Abdomen with no distention  Lower extremity edema ++  Data Reviewed:    Family Communication: no family at the bedside   Disposition: Status is: Observation The patient will require care spanning > 2 midnights and should be moved to inpatient because: IV diuresis   Planned  Discharge Destination: Home     Author: Arby Dahir Daniel Tamika Nou, MD 09/17/2024 8:45 AM  For on call review [email or web address].  "

## 2024-09-17 NOTE — Progress Notes (Signed)
 Heart Failure Navigator Progress Note  Assessed for Heart & Vascular TOC clinic readiness.  Patient does not meet criteria due to EF 60-65%. No HF TOC per Dr. Noralee. .   Navigator will sign off at this time.   Stephane Haddock, BSN, Scientist, clinical (histocompatibility and immunogenetics) Only

## 2024-09-17 NOTE — Hospital Course (Addendum)
 [name] was admitted to the hospital with the working diagnosis of heart failure exacerbation.   89 yo male with the past medical history of hypertension, heart failure, pulmonary embolism, and chronic back pain who presented with dyspnea.  Patient had acute episode of dyspnea and chest pressure that woke him up from his sleep, at that time his blood pressure was 200/100. His blood pressure remained high despite taking his medications, prompting him to call EMS. He was transported to the ED.  In the ED his blood pressure was 153/130, HR 67, RR 18 and 02 saturation was 90% Lungs with no wheezing or rales, heart with S1 and S2 present regular rhythm, abdomen was not distended, positive lower extremity edema.   Na 139, K 3,7 Cl 105 bicarbonate 22 glucose 104 bun 15 cr 0,96  Pro BNP 2,145  High sensitive troponin 29 and 29 Wbc 9,3 hgb 10.5 plt 526   Chest radiograph with cardiomegaly, bilateral pleural effusions, more left than right, with fluid in the right fissure.   EKG 64 bpm, normal axis, normal intervals, qtc 472, sinus rhythm with no significant ST segment or T wave changes.

## 2024-09-17 NOTE — Assessment & Plan Note (Signed)
Cell count stable.  

## 2024-09-17 NOTE — Assessment & Plan Note (Signed)
 No acute attack

## 2024-09-17 NOTE — Assessment & Plan Note (Addendum)
"  Continue anticoagulation with apixaban   "

## 2024-09-17 NOTE — Assessment & Plan Note (Signed)
 Continue isosorbide  and statin, no chest pain, no acute coronary syndrome

## 2024-09-18 ENCOUNTER — Telehealth (HOSPITAL_COMMUNITY): Payer: Self-pay

## 2024-09-18 ENCOUNTER — Other Ambulatory Visit (HOSPITAL_COMMUNITY): Payer: Self-pay

## 2024-09-18 LAB — BASIC METABOLIC PANEL WITH GFR
Anion gap: 9 (ref 5–15)
BUN: 16 mg/dL (ref 8–23)
CO2: 28 mmol/L (ref 22–32)
Calcium: 8.8 mg/dL — ABNORMAL LOW (ref 8.9–10.3)
Chloride: 102 mmol/L (ref 98–111)
Creatinine, Ser: 1.4 mg/dL — ABNORMAL HIGH (ref 0.61–1.24)
GFR, Estimated: 47 mL/min — ABNORMAL LOW
Glucose, Bld: 95 mg/dL (ref 70–99)
Potassium: 3.7 mmol/L (ref 3.5–5.1)
Sodium: 139 mmol/L (ref 135–145)

## 2024-09-18 LAB — MAGNESIUM: Magnesium: 2 mg/dL (ref 1.7–2.4)

## 2024-09-18 MED ORDER — SODIUM CHLORIDE 0.9 % IV BOLUS
250.0000 mL | Freq: Once | INTRAVENOUS | Status: AC
  Administered 2024-09-18: 250 mL via INTRAVENOUS

## 2024-09-18 MED ORDER — FUROSEMIDE 10 MG/ML IJ SOLN: 40.0000 mg | Freq: Two times a day (BID) | INTRAMUSCULAR | Status: DC

## 2024-09-18 MED ORDER — TRIAMCINOLONE ACETONIDE 0.1 % EX CREA: 1.0000 | TOPICAL_CREAM | Freq: Two times a day (BID) | CUTANEOUS | Status: AC | PRN

## 2024-09-18 MED ORDER — POTASSIUM CHLORIDE CRYS ER 20 MEQ PO TBCR
20.0000 meq | EXTENDED_RELEASE_TABLET | Freq: Once | ORAL | Status: AC
  Administered 2024-09-18: 20 meq via ORAL
  Filled 2024-09-18: qty 1

## 2024-09-18 MED ORDER — FUROSEMIDE 20 MG PO TABS: 20.0000 mg | ORAL_TABLET | Freq: Every day | ORAL | Status: DC

## 2024-09-18 MED ORDER — FUROSEMIDE 40 MG PO TABS: 40.0000 mg | ORAL_TABLET | Freq: Every day | ORAL | Status: DC

## 2024-09-18 NOTE — Evaluation (Signed)
 Occupational Therapy Evaluation Patient Details Name: [name] MRN: [id number] DOB: 01/21/1932 Today's Date: 09/18/2024   History of Present Illness   89 y.o. male presents to [facility] 09/16/24 with chest tightness, SOB, and elevated BP. Admitted with acute on chronic HFrEF and HTN crisis. CXR showed cardiomegaly with vascular congestion and small B pleural effusions. PMHx:  hypertension, chronic HFrEF, history of PE on Eliquis , and chronic back pain     Clinical Impressions Prior to this admission, patient living alone, with his neice nearby if he needs anything. Patient still drives, and is independent in ADLs (uses slip on shoes and does not don socks). Currently, patient is close to his baseline at a mod I level. Patient adamant that he does not want therapy at his home, however acute OT will follow to ensure functional mobility and indpendence is maintained.      If plan is discharge home, recommend the following:   Assist for transportation (initially)     Functional Status Assessment   Patient has had a recent decline in their functional status and/or demonstrates limited ability to make significant improvements in function in a reasonable and predictable amount of time     Equipment Recommendations   None recommended by OT     Recommendations for Other Services         Precautions/Restrictions   Precautions Precautions: Fall Recall of Precautions/Restrictions: Intact Restrictions Weight Bearing Restrictions Per Provider Order: No     Mobility Bed Mobility               General bed mobility comments: seated on EOB    Transfers Overall transfer level: Modified independent Equipment used: Rolling walker (2 wheels)                      Balance Overall balance assessment: Needs assistance Sitting-balance support: No upper extremity supported, Feet supported Sitting balance-Leahy Scale: Good     Standing balance support: Bilateral upper  extremity supported, During functional activity, Reliant on assistive device for balance Standing balance-Leahy Scale: Poor Standing balance comment: reliant on BUE support                           ADL either performed or assessed with clinical judgement   ADL Overall ADL's : Modified independent                                       General ADL Comments: Prior to this admission, patient living alone, with his neice nearby if he needs anything. Patient still drives, and is independent in ADLs (uses slip on shoes and does not don socks). Currently, patient is close to his baseline at a mod I level. Patient adamant that he does not want therapy at his home, however acute OT will follow to ensure functional mobility and indpendence is maintained.     Vision Baseline Vision/History: 0 No visual deficits Ability to See in Adequate Light: 0 Adequate Patient Visual Report: No change from baseline Vision Assessment?: No apparent visual deficits     Perception Perception: Not tested       Praxis Praxis: Not tested       Pertinent Vitals/Pain Pain Assessment Pain Assessment: No/denies pain     Extremity/Trunk Assessment Upper Extremity Assessment Upper Extremity Assessment: Overall WFL for tasks assessed;Right hand dominant   Lower Extremity  Assessment Lower Extremity Assessment: Defer to PT evaluation RLE Deficits / Details: 5/5 strength on MMT RLE Sensation: history of peripheral neuropathy LLE Deficits / Details: elevated L patella, grossly 4+/5. History of sciatica LLE Sensation: history of peripheral neuropathy   Cervical / Trunk Assessment Cervical / Trunk Assessment: Normal   Communication Communication Communication: No apparent difficulties   Cognition Arousal: Alert Behavior During Therapy: WFL for tasks assessed/performed Cognition: No apparent impairments                               Following commands: Intact        Cueing  General Comments   Cueing Techniques: Verbal cues      Exercises     Shoulder Instructions      Home Living Family/patient expects to be discharged to:: Private residence Living Arrangements: Alone   Type of Home: House Home Access: Elevator     Home Layout: Two level     Bathroom Shower/Tub: Producer, Television/film/video: Handicapped height     Home Equipment: Shower seat;Grab bars - tub/shower;Grab bars - toilet;Rolling Environmental Consultant (2 wheels)   Additional Comments: keeps a RW on the ground floor and one on second      Prior Functioning/Environment Prior Level of Function : Independent/Modified Independent;Driving             Mobility Comments: ModI with RW ADLs Comments: Ind    OT Problem List: Decreased activity tolerance   OT Treatment/Interventions: Self-care/ADL training;Therapeutic exercise;Energy conservation;DME and/or AE instruction;Manual therapy;Therapeutic activities;Patient/family education;Balance training      OT Goals(Current goals can be found in the care plan section)   Acute Rehab OT Goals Patient Stated Goal: to go home OT Goal Formulation: With patient Time For Goal Achievement: 10/02/24 Potential to Achieve Goals: Good ADL Goals Pt Will Perform Lower Body Bathing: with modified independence;sit to/from stand;sitting/lateral leans Pt Will Perform Lower Body Dressing: with modified independence;sitting/lateral leans;sit to/from stand Pt Will Transfer to Toilet: with modified independence;regular height toilet;ambulating Pt Will Perform Toileting - Clothing Manipulation and hygiene: Independently;sitting/lateral leans;sit to/from stand Additional ADL Goal #1: Patient will be able to recall 3 strategies for fall prevention to promote safe discharge home.   OT Frequency:  Min 2X/week    Co-evaluation              AM-PAC OT 6 Clicks Daily Activity     Outcome Measure Help from another person eating meals?:  None Help from another person taking care of personal grooming?: None Help from another person toileting, which includes using toliet, bedpan, or urinal?: None Help from another person bathing (including washing, rinsing, drying)?: None Help from another person to put on and taking off regular upper body clothing?: None Help from another person to put on and taking off regular lower body clothing?: None 6 Click Score: 24   End of Session Equipment Utilized During Treatment: Rolling walker (2 wheels) Nurse Communication: Mobility status;Other (comment) (refusing bed alarm)  Activity Tolerance: Patient tolerated treatment well Patient left: in bed;with call bell/phone within reach (sitting EOB)  OT Visit Diagnosis: Muscle weakness (generalized) (M62.81)                Time: 8976-8956 OT Time Calculation (min): 20 min Charges:  OT General Charges $OT Visit: 1 Visit OT Evaluation $OT Eval Moderate Complexity: 1 Mod  Ronal Gift E. Vitor Overbaugh, OTR/L Acute Rehabilitation Services [phone]   Ronal Gift Salt  09/18/2024, 1:50 PM

## 2024-09-18 NOTE — Telephone Encounter (Signed)
 Pharmacy Patient Advocate Encounter  Insurance verification completed.    The patient is insured through [facility]. Patient has Medicare and is not eligible for a copay card, but may be able to apply for patient assistance or Medicare RX Payment Plan (Patient Must reach out to their plan, if eligible for payment plan), if available.    Ran test claim for Jardiance  10mg  tablet and the current 30 day co-pay is $61.87.  Ran test claim for Farxiga 10mg  tablet and it is not covered by insurance.   This test claim was processed through [facility]- copay amounts may vary at other pharmacies due to pharmacy/plan contracts, or as the patient moves through the different stages of their insurance plan.

## 2024-09-18 NOTE — Progress Notes (Signed)
" °   09/18/24 1235  Assess: MEWS Score  Temp 98 F (36.7 C)  BP (!) 67/51  MAP (mmHg) (!) 57  Pulse Rate 86  ECG Heart Rate 83  Resp 16  SpO2 95 %  O2 Device Room Air  Assess: MEWS Score  MEWS Temp 0  MEWS Systolic 3  MEWS Pulse 0  MEWS RR 0  MEWS LOC 0  MEWS Score 3  MEWS Score Color Yellow  Assess: if the MEWS score is Yellow or Red  Were vital signs accurate and taken at a resting state? Yes  Does the patient meet 2 or more of the SIRS criteria? No  MEWS guidelines implemented  Yes, yellow  Treat  MEWS Interventions Considered administering scheduled or prn medications/treatments as ordered  Take Vital Signs  Increase Vital Sign Frequency  Yellow: Q2hr x1, continue Q4hrs until patient remains green for 12hrs  Escalate  MEWS: Escalate Yellow: Discuss with charge nurse and consider notifying provider and/or RRT  Notify: Charge Nurse/RN  Name of Charge Nurse/RN Notified Educational Psychologist  Provider Notification  Provider Name/Title Sebastian  Date Provider Notified 09/18/24  Time Provider Notified 1245  Method of Notification Face-to-face  Notification Reason Critical Result  Provider response See new orders  Date of Provider Response 09/18/24  Time of Provider Response 1245  Notify: Rapid Response  Name of Rapid Response RN Notified [facility] RN  Date Rapid Response Notified 09/18/24  Time Rapid Response Notified 1245  Assess: SIRS CRITERIA  SIRS Temperature  0  SIRS Respirations  0  SIRS Pulse 0  SIRS WBC 0  SIRS Score Sum  0    "

## 2024-09-18 NOTE — Discharge Instructions (Signed)

## 2024-09-18 NOTE — Evaluation (Signed)
 Physical Therapy Evaluation Patient Details Name: [name] MRN: [id number] DOB: 1932/01/22 Today's Date: 09/18/2024  History of Present Illness  89 y.o. male presents to [facility] 09/16/24 with chest tightness, SOB, and elevated BP. Admitted with acute on chronic HFrEF and HTN crisis. CXR showed cardiomegaly with vascular congestion and small B pleural effusions. PMHx:  hypertension, chronic HFrEF, history of PE on Eliquis , and chronic back pain   Clinical Impression  PTA pt lived alone and was ModI for mobility with use of RW. Pt reported hx of L LE sciatica and presented with impaired balance and decreased activity tolerance. Pt was ModI to stand with use of RW and supervision to ambulate 238ft. Pt has a kyphotic posture with rounded shoulders. He was unreceptive to feedback regarding RW proximity or upright posture stating I have been walking like this for the past 20 years. Discussed post-acute PT with recommendation for OP PT to address functional deficits. Will continue to follow acutely to work towards independence with mobility.       If plan is discharge home, recommend the following: Assist for transportation   Can travel by private vehicle   Yes     Equipment Recommendations None recommended by PT     Functional Status Assessment Patient has not had a recent decline in their functional status     Precautions / Restrictions Precautions Precautions: Fall Recall of Precautions/Restrictions: Intact Restrictions Weight Bearing Restrictions Per Provider Order: No      Mobility  Bed Mobility  General bed mobility comments: seated on EOB    Transfers Overall transfer level: Modified independent Equipment used: Rolling walker (2 wheels)       Ambulation/Gait Ambulation/Gait assistance: Supervision Gait Distance (Feet): 200 Feet Assistive device: Rolling walker (2 wheels) Gait Pattern/deviations: Step-through pattern, Decreased stride length, Trunk flexed Gait  velocity: decr    General Gait Details: kyphotic posture with no ability to correct rounded shoulders. Preferred to keep RW at an elevated height and far away from body. Was not receptive to cues for correction    Balance Overall balance assessment: Needs assistance Sitting-balance support: No upper extremity supported, Feet supported Sitting balance-Leahy Scale: Good     Standing balance support: Bilateral upper extremity supported, During functional activity, Reliant on assistive device for balance Standing balance-Leahy Scale: Poor Standing balance comment: reliant on BUE support        Pertinent Vitals/Pain Pain Assessment Pain Assessment: No/denies pain    Home Living Family/patient expects to be discharged to:: Private residence Living Arrangements: Alone   Type of Home: House Home Access: Elevator    Home Layout: Two level Home Equipment: Shower seat;Grab bars - tub/shower;Grab bars - toilet;Rolling Walker (2 wheels) Additional Comments: keeps a RW on the ground floor and one on second    Prior Function Prior Level of Function : Independent/Modified Independent;Driving    Mobility Comments: ModI with RW ADLs Comments: Ind     Extremity/Trunk Assessment   Upper Extremity Assessment Upper Extremity Assessment: Defer to OT evaluation    Lower Extremity Assessment Lower Extremity Assessment: RLE deficits/detail;LLE deficits/detail RLE Deficits / Details: 5/5 strength on MMT RLE Sensation: history of peripheral neuropathy LLE Deficits / Details: elevated L patella, grossly 4+/5. History of sciatica LLE Sensation: history of peripheral neuropathy    Cervical / Trunk Assessment Cervical / Trunk Assessment: Normal  Communication   Communication Communication: No apparent difficulties    Cognition Arousal: Alert Behavior During Therapy: WFL for tasks assessed/performed   PT - Cognitive impairments:  No apparent impairments    Following commands: Intact        Cueing Cueing Techniques: Verbal cues      PT Assessment Patient needs continued PT services  PT Problem List Decreased strength;Decreased activity tolerance;Decreased balance;Decreased mobility       PT Treatment Interventions DME instruction;Gait training;Functional mobility training;Therapeutic activities;Therapeutic exercise;Neuromuscular re-education;Balance training;Patient/family education    PT Goals (Current goals can be found in the Care Plan section)  Acute Rehab PT Goals Patient Stated Goal: to be able to weld again PT Goal Formulation: With patient Time For Goal Achievement: 10/02/24 Potential to Achieve Goals: Good    Frequency Min 1X/week        AM-PAC PT 6 Clicks Mobility  Outcome Measure Help needed turning from your back to your side while in a flat bed without using bedrails?: None Help needed moving from lying on your back to sitting on the side of a flat bed without using bedrails?: None Help needed moving to and from a bed to a chair (including a wheelchair)?: None Help needed standing up from a chair using your arms (e.g., wheelchair or bedside chair)?: None Help needed to walk in hospital room?: None Help needed climbing 3-5 steps with a railing? : A Little 6 Click Score: 23    End of Session   Activity Tolerance: Patient tolerated treatment well Patient left: in bed;with call bell/phone within reach;Other (comment) (with OT) Nurse Communication: Mobility status PT Visit Diagnosis: Other abnormalities of gait and mobility (R26.89);Muscle weakness (generalized) (M62.81)    Time: 8998-8975 PT Time Calculation (min) (ACUTE ONLY): 23 min   Charges:   PT Evaluation $PT Eval Low Complexity: 1 Low   PT General Charges $$ ACUTE PT VISIT: 1 Visit       Jordan ORN, PT, DPT Secure Chat Preferred  Rehab Office [phone]   [name] Wendolyn 09/18/2024, 12:29 PM

## 2024-09-18 NOTE — Progress Notes (Addendum)
 " PROGRESS NOTE    [name]  FMW:[id number] DOB: [birth year] DOA: 09/16/2024 PCP: Ileen Rosaline NOVAK, NP    Chief Complaint  Patient presents with   Chest Pain    Brief Narrative:  [name] was admitted to the hospital with the working diagnosis of heart failure exacerbation.    89 yo male with the past medical history of hypertension, heart failure, pulmonary embolism, and chronic back pain who presented with dyspnea.  Patient had acute episode of dyspnea and chest pressure that woke him up from his sleep, at that time his blood pressure was 200/100. His blood pressure remained high despite taking his medications, prompting him to call EMS. He was transported to the ED.  In the ED his blood pressure was 153/130, HR 67, RR 18 and 02 saturation was 90% Lungs with no wheezing or rales, heart with S1 and S2 present regular rhythm, abdomen was not distended, positive lower extremity edema.    Na 139, K 3,7 Cl 105 bicarbonate 22 glucose 104 bun 15 cr 0,96  Pro BNP 2,145  High sensitive troponin 29 and 29 Wbc 9,3 hgb 10.5 plt 526    Chest radiograph with cardiomegaly, bilateral pleural effusions, more left than right, with fluid in the right fissure.    EKG 64 bpm, normal axis, normal intervals, qtc 472, sinus rhythm with no significant ST segment or T wave changes.    Assessment & Plan:   Principal Problem:   Acute on chronic diastolic CHF (congestive heart failure) (HCC) Active Problems:   CAD (coronary artery disease)   Essential hypertension   Hypokalemia   Hyperlipidemia   Gout   Iron deficiency anemia   Hx pulmonary embolism   Chronic radicular low back pain  #1 acute on chronic diastolic CHF exacerbation -Patient presented with shortness of breath, noted to have elevated blood pressures with no improvement initially at home and subsequently called EMS. - Patient noted on admission to be volume overloaded. - Chest x-ray done on admission with cardiomegaly with  vascular congestion and small bilateral pleural effusions left greater than right. - 2D echo with EF of 60 to 65%, and WMA, mild LVH, grade 1 DD.  Moderately dilated left atrial size, no evidence of pericardial effusion, trivial MVR. - Patient with urine output of 1.475 L over the past 24 hours and patient is -3.705 L since admission. -Current weight 189.38 pounds from 174.6 pounds, down accuracy as patient with good urine output and clinical improvement - Patient with sats of 95% on room air. - Patient with bump in creatinine to 1.40. - Patient noted to have received Lasix  40 mg IV this morning. - Will transition to Lasix  40 mg orally daily starting tomorrow 09/19/2024. - Continue Imdur , Cozaar , Toprol -XL, Aldactone , Jardiance . - Strict I's and O's, daily weights, supportive care.  2. hypokalemia -Repleted.  3.  CAD -Continue Imdur , statin, Toprol -XL, Cozaar , Aldactone . - Patient with no signs of ACS.  4.  Hypertension -Continue current regimen of Toprol -XL, Cozaar , Aldactone , Imdur , Lasix .  5.  Hyperlipidemia -Continue statin.  6.  Gout -Stable.  7.  History of PE -Eliquis .  8.  Chronic radicular low back pain -Continue analgesics.  9.  Iron deficiency anemia -Patient with no overt bleeding. - Hemoglobin stable at 10.2.  Addendum Patient noted to have transient hypotension this morning.  Likely secondary to diuresis. Normal saline 500 cc bolus x 1 given with improvement with blood pressure to 106/54. Hold diuretics. Reassess in the AM.  DVT  prophylaxis: Eliquis  Code Status: DNR Family Communication: Updated patient.  No family at bedside. Disposition: TBD  Status is: Inpatient Remains inpatient appropriate because: Severity of illness   Consultants:  None  Procedures:  Chest x-ray 09/16/2024 2D echo 09/17/2024   Antimicrobials:  Anti-infectives (From admission, onward)    None         Subjective: Patient laying in bed.  Denies any chest pain or  significant shortness of breath.  Feels shortness of breath has improved since admission.  Denies any abdominal pain.  Overall feels better than on admission.  RN at bedside trying to place IV.  Objective: Vitals:   09/17/24 1942 09/17/24 2321 09/18/24 0347 09/18/24 0830  BP: (!) 170/80 (!) 160/71 118/69 131/65  Pulse: 69 69 66 72  Resp: 18 17 19 16   Temp: 97.9 F (36.6 C) (!) 97.5 F (36.4 C) (!) 97.5 F (36.4 C) 98.1 F (36.7 C)  TempSrc: Oral Oral Oral Oral  SpO2: 98% 98% 96% 95%  Weight:   85.9 kg   Height:        Intake/Output Summary (Last 24 hours) at 09/18/2024 1124 Last data filed at 09/18/2024 1034 Gross per 24 hour  Intake 420 ml  Output 2425 ml  Net -2005 ml   Filed Weights   09/17/24 1754 09/18/24 0347  Weight: 79.2 kg 85.9 kg    Examination:  General exam: Appears calm and comfortable  Respiratory system: Clear to auscultation. Respiratory effort normal. Cardiovascular system: S1 & S2 heard, RRR. No JVD, murmurs, rubs, gallops or clicks.  Trace-1+ bilateral lower extremity edema. Gastrointestinal system: Abdomen is nondistended, soft and nontender. No organomegaly or masses felt. Normal bowel sounds heard. Central nervous system: Alert and oriented. No focal neurological deficits. Extremities: Symmetric 5 x 5 power. Skin: No rashes, lesions or ulcers Psychiatry: Judgement and insight appear normal. Mood & affect appropriate.     Data Reviewed: I have personally reviewed following labs and imaging studies  CBC: Recent Labs  Lab 09/16/24 1552 09/17/24 0243  WBC 9.3 8.9  NEUTROABS 6.0  --   HGB 10.5* 10.2*  HCT 34.7* 32.7*  MCV 83.2 81.8  PLT 526* 475*    Basic Metabolic Panel: Recent Labs  Lab 09/16/24 1552 09/17/24 0243 09/18/24 0252  NA 139 141 139  K 3.7 3.4* 3.7  CL 105 106 102  CO2 22 25 28   GLUCOSE 104* 94 95  BUN 15 14 16   CREATININE 0.96 1.03 1.40*  CALCIUM  8.9 9.0 8.8*  MG  --  1.7 2.0    GFR: Estimated Creatinine  Clearance: 37.7 mL/min (A) (by C-G formula based on SCr of 1.4 mg/dL (H)).  Liver Function Tests: Recent Labs  Lab 09/16/24 1552  AST 21  ALT 7  ALKPHOS 91  BILITOT 0.6  PROT 6.7  ALBUMIN  3.8    CBG: No results for input(s): GLUCAP in the last 168 hours.   No results found for this or any previous visit (from the past 240 hours).       Radiology Studies: ECHOCARDIOGRAM COMPLETE Result Date: 09/17/2024    ECHOCARDIOGRAM REPORT   Patient Name:   [name] Date of Exam: 09/17/2024 Medical Rec #:  [id number]       Height:       72.0 in Accession #:    [id number]      Weight:       185.0 lb Date of Birth:  1931-08-29        BSA:  2.061 m Patient Age:    89 years        BP:           153/93 mmHg Patient Gender: M               HR:           68 bpm. Exam Location:  Inpatient Procedure: 2D Echo, Cardiac Doppler, Color Doppler and Intracardiac            Opacification Agent (Both Spectral and Color Flow Doppler were            utilized during procedure). Indications:    CHF  History:        Patient has prior history of Echocardiogram examinations, most                 recent 06/20/2022. CHF; Risk Factors:Hypertension and                 Dyslipidemia.  Sonographer:    Philomena Daring Referring Phys: EVALENE RAMAN OPYD IMPRESSIONS  1. Left ventricular ejection fraction, by estimation, is 60 to 65%. The left ventricle has normal function. The left ventricle has no regional wall motion abnormalities. There is mild left ventricular hypertrophy. Left ventricular diastolic parameters are consistent with Grade I diastolic dysfunction (impaired relaxation).  2. Right ventricular systolic function is low normal. The right ventricular size is normal. There is normal pulmonary artery systolic pressure. The estimated right ventricular systolic pressure is 35.2 mmHg.  3. Left atrial size was moderately dilated.  4. There is no evidence of pericardial effusion.  5. The mitral valve is abnormal. Trivial mitral  valve regurgitation. No evidence of mitral stenosis.  6. The aortic valve is tricuspid. Aortic valve regurgitation is not visualized. No aortic stenosis is present.  7. The inferior vena cava is dilated in size with >50% respiratory variability, suggesting right atrial pressure of 8 mmHg. Comparison(s): Changes from prior study are noted. 06/20/2022: LVEF 40%. FINDINGS  Left Ventricle: Left ventricular ejection fraction, by estimation, is 60 to 65%. The left ventricle has normal function. The left ventricle has no regional wall motion abnormalities. Definity  contrast agent was given IV to delineate the left ventricular  endocardial borders. The left ventricular internal cavity size was normal in size. There is mild left ventricular hypertrophy. Left ventricular diastolic parameters are consistent with Grade I diastolic dysfunction (impaired relaxation). Indeterminate filling pressures. Right Ventricle: The right ventricular size is normal. No increase in right ventricular wall thickness. Right ventricular systolic function is low normal. There is normal pulmonary artery systolic pressure. The tricuspid regurgitant velocity is 2.61 m/s,  and with an assumed right atrial pressure of 8 mmHg, the estimated right ventricular systolic pressure is 35.2 mmHg. Left Atrium: Left atrial size was moderately dilated. Right Atrium: Right atrial size was normal in size. Pericardium: There is no evidence of pericardial effusion. Mitral Valve: The mitral valve is abnormal. Trivial mitral valve regurgitation. No evidence of mitral valve stenosis. Tricuspid Valve: The tricuspid valve is normal in structure. Tricuspid valve regurgitation is trivial. No evidence of tricuspid stenosis. Aortic Valve: The aortic valve is tricuspid. Aortic valve regurgitation is not visualized. No aortic stenosis is present. Aortic valve mean gradient measures 4.0 mmHg. Aortic valve peak gradient measures 6.8 mmHg. Aortic valve area, by VTI measures 2.25  cm. Pulmonic Valve: The pulmonic valve was normal in structure. Pulmonic valve regurgitation is not visualized. No evidence of pulmonic stenosis. Aorta: The aortic root and ascending aorta  are structurally normal, with no evidence of dilitation. Venous: The inferior vena cava is dilated in size with greater than 50% respiratory variability, suggesting right atrial pressure of 8 mmHg. IAS/Shunts: No atrial level shunt detected by color flow Doppler.  LEFT VENTRICLE PLAX 2D LVIDd:         4.60 cm   Diastology LVIDs:         3.30 cm   LV e' medial:    6.85 cm/s LV PW:         1.30 cm   LV E/e' medial:  14.5 LV IVS:        1.20 cm   LV e' lateral:   10.00 cm/s LVOT diam:     2.10 cm   LV E/e' lateral: 10.0 LV SV:         68 LV SV Index:   33 LVOT Area:     3.46 cm  RIGHT VENTRICLE             IVC RV S prime:     10.60 cm/s  IVC diam: 2.20 cm TAPSE (M-mode): 1.7 cm                             PULMONARY VEINS                             Diastolic Velocity: 47.90 cm/s                             S/D Velocity:       1.60                             Systolic Velocity:  76.10 cm/s LEFT ATRIUM             Index        RIGHT ATRIUM           Index LA diam:        4.70 cm 2.28 cm/m   RA Area:     19.10 cm LA Vol (A2C):   74.0 ml 35.90 ml/m  RA Volume:   53.80 ml  26.10 ml/m LA Vol (A4C):   79.2 ml 38.43 ml/m LA Biplane Vol: 79.0 ml 38.33 ml/m  AORTIC VALVE AV Area (Vmax):    2.12 cm AV Area (Vmean):   2.10 cm AV Area (VTI):     2.25 cm AV Vmax:           130.00 cm/s AV Vmean:          88.200 cm/s AV VTI:            0.300 m AV Peak Grad:      6.8 mmHg AV Mean Grad:      4.0 mmHg LVOT Vmax:         79.70 cm/s LVOT Vmean:        53.500 cm/s LVOT VTI:          0.195 m LVOT/AV VTI ratio: 0.65  AORTA Ao Root diam: 3.30 cm Ao Asc diam:  3.30 cm MITRAL VALVE                TRICUSPID VALVE MV Area (PHT): 3.83 cm     TR Peak grad:   27.2 mmHg MV Decel Time: 198 msec  TR Vmax:        261.00 cm/s MV E velocity: 99.60 cm/s MV  A velocity: 104.00 cm/s  SHUNTS MV E/A ratio:  0.96         Systemic VTI:  0.20 m                             Systemic Diam: 2.10 cm Jordan Maxcy MD Electronically signed by Jordan Maxcy MD Signature Date/Time: 09/17/2024/12:30:17 PM    Final    DG Chest 2 View Result Date: 09/16/2024 CLINICAL DATA:  Chest pain. EXAM: CHEST - 2 VIEW COMPARISON:  Chest CT 04/22/2023 FINDINGS: Cardiomegaly is stable. Mediastinal contours are unchanged. Aortic atherosclerosis. Small bilateral pleural effusions, left greater than right. Vascular congestion. No confluent opacity. No pneumothorax. Chronic shoulder arthropathy. IMPRESSION: Cardiomegaly with vascular congestion and small bilateral pleural effusions, left greater than right. Electronically Signed   By: Andrea Gasman M.D.   On: 09/16/2024 16:59        Scheduled Meds:  apixaban   5 mg Oral BID   empagliflozin   10 mg Oral Daily   fluticasone   2 spray Each Nare BID   furosemide   40 mg Intravenous BID   isosorbide  mononitrate  30 mg Oral Daily   losartan   50 mg Oral Daily   metoprolol  succinate  50 mg Oral Daily   sodium chloride  flush  3 mL Intravenous Q12H   spironolactone   12.5 mg Oral Daily   tamsulosin   0.4 mg Oral Daily   Continuous Infusions:   LOS: 1 day    Time spent: 40 minutes    Toribio Hummer, MD [facility]   To contact the attending provider between 7A-7P or the covering provider during after hours 7P-7A, please log into the web site [email or web address] and access using universal [place] password for that web site. If you do not have the password, please call the hospital operator.  09/18/2024, 11:24 AM    "

## 2024-09-18 NOTE — Plan of Care (Signed)
   Problem: Education: Goal: Knowledge of General Education information will improve Description: Including pain rating scale, medication(s)/side effects and non-pharmacologic comfort measures Outcome: Progressing   Problem: Health Behavior/Discharge Planning: Goal: Ability to manage health-related needs will improve Outcome: Progressing   Problem: Activity: Goal: Risk for activity intolerance will decrease Outcome: Progressing

## 2024-09-18 NOTE — TOC Initial Note (Signed)
 Transition of Care [facility]) - Initial/Assessment Note    Patient Details  Name: [name] MRN: [id number] Date of Birth: August 27, 1931  Transition of Care [facility]) CM/SW Contact:    Waddell Barnie Rama, RN Phone Number: 09/18/2024, 3:30 PM  Clinical Narrative:                 From home alone,  has PCP and insurance on file, states has no HH services in place at this time , has walker at home.  States family member  (niece) will transport them home at [employer] and family is support system, states gets medications from [facility] [address].  Pta self ambulatory.  Per pt eval rec OP PT, patient states at this state and time he does not want to do OP PT or HHPT.   There are no ICM needs identified  at this time.  Please place consult for ICM needs.    Expected Discharge Plan: OP Rehab Barriers to Discharge: Continued Medical Work up   Patient Goals and CMS Choice Patient states their goals for this hospitalization and ongoing recovery are:: return home   Choice offered to / list presented to : NA      Expected Discharge Plan and Services In-house Referral: NA Discharge Planning Services: CM Consult Post Acute Care Choice: NA Living arrangements for the past 2 months: Single Family Home                 DME Arranged: N/A DME Agency: NA       HH Arranged: NA          Prior Living Arrangements/Services Living arrangements for the past 2 months: Single Family Home Lives with:: Self Patient language and need for interpreter reviewed:: Yes Do you feel safe going back to the place where you live?: Yes      Need for Family Participation in Patient Care: Yes (Comment) Care giver support system in place?: No (comment) Current home services: DME (walker) Criminal Activity/Legal Involvement Pertinent to Current Situation/Hospitalization: No - Comment as needed  Activities of Daily Living   ADL Screening (condition at time of admission) Independently performs ADLs?: Yes (appropriate for  developmental age) Is the patient deaf or have difficulty hearing?: No Does the patient have difficulty seeing, even when wearing glasses/contacts?: No Does the patient have difficulty concentrating, remembering, or making decisions?: No  Permission Sought/Granted Permission sought to share information with : Case Manager Permission granted to share information with : Yes, Verbal Permission Granted              Emotional Assessment Appearance:: Appears stated age Attitude/Demeanor/Rapport: Engaged Affect (typically observed): Appropriate Orientation: : Oriented to Self, Oriented to Place, Oriented to  Time, Oriented to Situation Alcohol / Substance Use: Not Applicable Psych Involvement: No (comment)  Admission diagnosis:  Acute on chronic HFrEF (heart failure with reduced ejection fraction) (HCC) [I50.23] Congestive heart failure, unspecified HF chronicity, unspecified heart failure type (HCC) [I50.9] Acute on chronic heart failure with reduced ejection fraction (HFrEF, <= 40%) (HCC) [I50.23] Patient Active Problem List   Diagnosis Date Noted   Acute on chronic heart failure with reduced ejection fraction (HFrEF, <= 40%) (HCC) 09/17/2024   Acute on chronic diastolic CHF (congestive heart failure) (HCC) 09/16/2024   Flexor carpi radialis tenosynovitis 01/13/2023   HCAP (healthcare-associated pneumonia) 08/16/2022   Chest pain 07/27/2022   CHF (congestive heart failure) (HCC) 07/24/2022   Hx pulmonary embolism 06/20/2022   Hypokalemia 06/20/2022   Abnormal EKG 06/19/2022  Fracture of twelfth thoracic vertebra (HCC) 01/03/2022   Opioid dependence (HCC) 01/03/2022   Poor balance 01/03/2022   Pedal edema 01/03/2022   Bilateral wrist pain 06/19/2021   Fall 01/11/2021   Orthostatic hypotension    Weakness    Osteoporosis 10/07/2020   Lumbar compression fracture (HCC) 09/07/2020   Greater trochanteric bursitis of both hips 05/04/2020   Left patella fracture 01/19/2020    Arthritis of left knee 05/07/2019   Pre-operative clearance 04/28/2019   Hypertensive crisis 08/10/2018   Acute on chronic systolic CHF (congestive heart failure) (HCC) 08/10/2018   Leg wound, left 03/19/2018   Right arm pain 11/11/2017   Primary osteoarthritis of right foot 08/21/2017   Syncope 02/27/2017   Arthritis of right ankle 12/18/2016   Carpal tunnel syndrome, left upper limb 09/04/2016   Leg swelling 08/16/2016   Hand weakness 07/12/2016   Degenerative arthritis of left knee 06/28/2016   Body mass index (BMI) of 28.0 to 28.9 in adult 06/18/2016   S/P cervical spinal fusion 06/14/2016   Iron deficiency anemia 03/16/2016   Arthritis of left acromioclavicular joint 01/03/2016   Rotator cuff disorder 01/03/2016   Osteoarthritis of left knee 04/30/2015   Baker's cyst of knee 04/30/2015   Esophageal stricture 04/26/2015   Benign prostatic hyperplasia 02/21/2015   Constipation 02/21/2015   Myalgia and myositis 11/18/2014   Status post lumbar spine operation 06/25/2014   Pseudoarthrosis of cervical spine (HCC) 05/25/2014   Sacral fracture, closed (HCC) 09/23/2013   Spondylolisthesis, congenital 09/02/2013   Polyarticular arthritis 07/19/2013   Spondylosis of lumbosacral spine at multiple levels without myelopathy 04/24/2013   Chronic radicular low back pain 12/15/2012   Gout 02/20/2010   ERECTILE DYSFUNCTION, ORGANIC 02/20/2010   Hyperlipidemia 10/18/2008   CAD (coronary artery disease) 08/18/2007   Essential hypertension 05/09/2007   ALLERGIC RHINITIS 03/27/2007   PCP:  Ileen Rosaline NOVAK, NP Pharmacy:   [id number] [place] - [address] [place] [address] Phone: [phone] Fax: [phone]  [facility] [address] [place] [address] Phone: [phone] Fax: [phone]     Social Drivers of Health (SDOH) Social History: SDOH Screenings   Food Insecurity: No Food Insecurity  (09/17/2024)  Housing: Low Risk (09/17/2024)  Transportation Needs: No Transportation Needs (09/17/2024)  Utilities: Not At Risk (09/17/2024)  Alcohol Screen: Low Risk (07/25/2022)  Depression (PHQ2-9): Low Risk (10/22/2022)  Financial Resource Strain: Low Risk (07/25/2022)  Physical Activity: Inactive (11/20/2021)  Social Connections: Socially Isolated (09/17/2024)  Stress: No Stress Concern Present (01/19/2022)   Received from [facility]  Tobacco Use: Medium Risk (09/16/2024)   SDOH Interventions: Social Connections Interventions: Inpatient TOC, Community Resources Provided   Readmission Risk Interventions    09/18/2024    3:23 PM 07/26/2022    4:11 PM  Readmission Risk Prevention Plan  Transportation Screening Complete Complete  PCP or Specialist Appt within 3-5 Days Complete Complete  HRI or Home Care Consult Complete Complete  Social Work Consult for Recovery Care Planning/Counseling  --  Palliative Care Screening Not Applicable Not Applicable  Medication Review Oceanographer) Complete Complete

## 2024-09-24 ENCOUNTER — Ambulatory Visit: Admitting: Podiatry
